# Patient Record
Sex: Female | Born: 1955 | Race: Black or African American | Hispanic: No | Marital: Single | Smoking: Never smoker
Health system: Southern US, Community
[De-identification: ages and names within clinical notes are randomized; demographics above are authoritative.]

## PROBLEM LIST (undated history)

## (undated) DIAGNOSIS — C50919 Malignant neoplasm of unspecified site of unspecified female breast: Secondary | ICD-10-CM

## (undated) DIAGNOSIS — E119 Type 2 diabetes mellitus without complications: Secondary | ICD-10-CM

## (undated) DIAGNOSIS — IMO0001 Reserved for inherently not codable concepts without codable children: Secondary | ICD-10-CM

## (undated) DIAGNOSIS — IMO0002 Reserved for concepts with insufficient information to code with codable children: Secondary | ICD-10-CM

## (undated) DIAGNOSIS — I10 Essential (primary) hypertension: Secondary | ICD-10-CM

## (undated) DIAGNOSIS — E785 Hyperlipidemia, unspecified: Secondary | ICD-10-CM

## (undated) HISTORY — DX: Reserved for concepts with insufficient information to code with codable children: IMO0002

## (undated) HISTORY — DX: Essential (primary) hypertension: I10

## (undated) HISTORY — DX: Reserved for inherently not codable concepts without codable children: IMO0001

## (undated) HISTORY — DX: Hyperlipidemia, unspecified: E78.5

---

## 1998-06-22 ENCOUNTER — Encounter: Payer: Self-pay | Admitting: Internal Medicine

## 1998-06-22 ENCOUNTER — Inpatient Hospital Stay (HOSPITAL_COMMUNITY): Admission: EM | Admit: 1998-06-22 | Discharge: 1998-06-29 | Payer: Self-pay | Admitting: Internal Medicine

## 2001-12-01 ENCOUNTER — Encounter: Payer: Self-pay | Admitting: Emergency Medicine

## 2001-12-01 ENCOUNTER — Emergency Department (HOSPITAL_COMMUNITY): Admission: EM | Admit: 2001-12-01 | Discharge: 2001-12-01 | Payer: Self-pay | Admitting: Emergency Medicine

## 2003-03-26 ENCOUNTER — Emergency Department (HOSPITAL_COMMUNITY): Admission: AD | Admit: 2003-03-26 | Discharge: 2003-03-26 | Payer: Self-pay | Admitting: Emergency Medicine

## 2003-03-29 ENCOUNTER — Encounter: Payer: Self-pay | Admitting: Family Medicine

## 2003-03-30 ENCOUNTER — Encounter: Payer: Self-pay | Admitting: Family Medicine

## 2003-03-30 ENCOUNTER — Inpatient Hospital Stay (HOSPITAL_COMMUNITY): Admission: EM | Admit: 2003-03-30 | Discharge: 2003-04-07 | Payer: Self-pay | Admitting: Emergency Medicine

## 2006-10-08 ENCOUNTER — Emergency Department (HOSPITAL_COMMUNITY): Admission: EM | Admit: 2006-10-08 | Discharge: 2006-10-08 | Payer: Self-pay | Admitting: Emergency Medicine

## 2007-05-26 ENCOUNTER — Emergency Department (HOSPITAL_COMMUNITY): Admission: EM | Admit: 2007-05-26 | Discharge: 2007-05-26 | Payer: Self-pay

## 2009-03-12 ENCOUNTER — Encounter: Admission: RE | Admit: 2009-03-12 | Discharge: 2009-03-12 | Payer: Self-pay | Admitting: *Deleted

## 2009-06-16 HISTORY — PX: CHOLECYSTECTOMY: SHX55

## 2009-06-16 HISTORY — PX: VENTRAL HERNIA REPAIR: SHX424

## 2009-10-31 ENCOUNTER — Encounter: Admission: RE | Admit: 2009-10-31 | Discharge: 2009-10-31 | Payer: Self-pay | Admitting: Family Medicine

## 2010-06-08 ENCOUNTER — Encounter (INDEPENDENT_AMBULATORY_CARE_PROVIDER_SITE_OTHER): Payer: Self-pay

## 2010-06-08 ENCOUNTER — Inpatient Hospital Stay (HOSPITAL_COMMUNITY): Admission: EM | Admit: 2010-06-08 | Discharge: 2010-06-14 | Payer: Self-pay | Source: Home / Self Care

## 2010-06-21 ENCOUNTER — Encounter
Admission: RE | Admit: 2010-06-21 | Discharge: 2010-06-21 | Payer: Self-pay | Source: Home / Self Care | Attending: Surgery | Admitting: Surgery

## 2010-06-24 DIAGNOSIS — R0902 Hypoxemia: Secondary | ICD-10-CM | POA: Insufficient documentation

## 2010-06-24 DIAGNOSIS — K8020 Calculus of gallbladder without cholecystitis without obstruction: Secondary | ICD-10-CM | POA: Insufficient documentation

## 2010-06-24 DIAGNOSIS — E119 Type 2 diabetes mellitus without complications: Secondary | ICD-10-CM | POA: Insufficient documentation

## 2010-06-24 DIAGNOSIS — Z8719 Personal history of other diseases of the digestive system: Secondary | ICD-10-CM | POA: Insufficient documentation

## 2010-06-25 ENCOUNTER — Ambulatory Visit: Admit: 2010-06-25 | Payer: Self-pay | Admitting: Critical Care Medicine

## 2010-06-26 ENCOUNTER — Telehealth: Payer: Self-pay | Admitting: Critical Care Medicine

## 2010-07-16 ENCOUNTER — Ambulatory Visit
Admission: RE | Admit: 2010-07-16 | Discharge: 2010-07-16 | Payer: Self-pay | Source: Home / Self Care | Attending: Critical Care Medicine | Admitting: Critical Care Medicine

## 2010-07-16 DIAGNOSIS — J9819 Other pulmonary collapse: Secondary | ICD-10-CM | POA: Insufficient documentation

## 2010-07-16 DIAGNOSIS — E785 Hyperlipidemia, unspecified: Secondary | ICD-10-CM | POA: Insufficient documentation

## 2010-07-18 NOTE — Progress Notes (Signed)
Summary: nos appt  Phone Note Call from Patient   Caller: [email or web address]  Call For: Carmen Stone Summary of Call: In ref to nos from 1/10, pt states she will call to rsc. Initial call taken by: Netta Neat,  June 26, 2010 3:19 PM

## 2010-07-24 NOTE — Assessment & Plan Note (Addendum)
Summary: Pulmonary Consultation   Copy to:  Dr. Coralie Keens Primary Provider/Referring Provider:  Dr. Louann Liv  CC:  Pulmonary Consult - recent surgery.  Was d/c'd on o2.Marland Kitchen  History of Present Illness: Pulmonary Consultation 55yo AAF referred to eval for any pulm issus related to abdominal surgery.   d/c 12/30 and  had hernia repair and mesh  on ventral abdomen. THis was a complication of  hernia surgery 12 yrs previous The pt had gb removed and ventral hernia repaired  The pt is a never smoker The pt had post op ATX and hypoxemia.  Pt was sent home with hoxygen.   Now:Pt is   is doing well,  there is  no cough, there is  no wheeze.  Pt is not now using home oxygen. Pt denies any issues.  Preventive Screening-Counseling & Management  Alcohol-Tobacco     Smoking Status: never  Current Medications (verified): 1)  Cephalexin 500 Mg Caps (Cephalexin) .... Take 1 Capsule By Mouth Three Times A Day 2)  Hydrocodone-Acetaminophen 5-325 Mg Tabs (Hydrocodone-Acetaminophen) .... As Needed 3)  Lisinopril 5 Mg Tabs (Lisinopril) .... Take 1 Tablet By Mouth Once A Day 4)  Metformin Hcl 500 Mg Xr24h-Tab (Metformin Hcl) .... Take 1 Tablet By Mouth Two Times A Day 5)  Oxygen .... As Needed  Allergies (verified): No Known Drug Allergies  Past History:  Past medical, surgical, family and social histories (including risk factors) reviewed, and no changes noted (except as noted below).  Past Medical History: HYPERLIPIDEMIA (ICD-272.4) DIABETES MELLITUS, TYPE II (ICD-250.00) CHOLELITHIASIS (ICD-574.20) SMALL BOWEL OBSTRUCTION, HX OF (ICD-V12.79)  Past Surgical History: exploratory laparotomy with repair of incarcerated incisional hernia with status mesh lysis of adhesions and repair of colotomy Cholecystectomy 05/2010  Family History: Reviewed history and no changes required. none  Social History: Reviewed history and no changes required. Never Smoked 1 daughter lives with  daughter no alcohol Smoking Status:  never  Review of Systems       The patient complains of loss of appetite.  The patient denies shortness of breath with activity, shortness of breath at rest, productive cough, non-productive cough, coughing up blood, chest pain, irregular heartbeats, acid heartburn, indigestion, weight change, abdominal pain, difficulty swallowing, sore throat, tooth/dental problems, headaches, nasal congestion/difficulty breathing through nose, sneezing, itching, ear ache, anxiety, depression, hand/feet swelling, joint stiffness or pain, rash, change in color of mucus, and fever.    Vital Signs:  Patient profile:   55 year old female Height:      64 inches Weight:      208.25 pounds BMI:     35.88 O2 Sat:      95 % on Room air Temp:     98.4 degrees F oral Pulse rate:   87 / minute BP sitting:   132 / 66  (right arm) Cuff size:   large  Vitals Entered By: Raymondo Band RN (July 16, 2010 1:33 PM)  O2 Flow:  Room air  Serial Vital Signs/Assessments:  Comments: 1:55 PM Ambulatory Pulse Oximetry  Resting; HR__86___    02 Sat__95% on room air___  Lap1 (185 feet)   HR__117___   02 Sat__91% on room air___ Lap2 (185 feet)   HR__120___   02 Sat_90% on room air____    Lap3 (185 feet)   HR__125___   02 Sat__91% on room air___  _x__Test Completed without Difficulty ___Test Stopped due to:  By: Francesca Jewett CMA   CC: Pulmonary Consult - recent surgery.  Was d/c'd  on o2. Comments Medications reviewed with patient Daytime contact number verified with patient. Crystal Jones RN  July 16, 2010 1:28 PM    Physical Exam  Additional Exam:  Gen: Pleasant, obese , in no distress,  normal affect ENT: No lesions,  mouth clear,  oropharynx clear, no postnasal drip Neck: No JVD, no TMG, no carotid bruits Lungs: No use of accessory muscles, no dullness to percussion, clear without rales or rhonchi Cardiovascular: RRR, heart sounds normal, no murmur or gallops, no  peripheral edema Abdomen: soft and NT, no HSM,  BS normal, abd  incision healing Musculoskeletal: No deformities, no cyanosis or clubbing Neuro: alert, non focal Skin: Warm, no lesions or rashes    Impression & Recommendations:  Problem # 1:  ATELECTASIS (ICD-518.0) Assessment Improved 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  Now imporved plan d/c oxygen no further pulm f/u or rx needed  rov as needed  Orders: New Patient Level V [id number])  Medications Added to Medication List This Visit: 1)  Cephalexin 500 Mg Caps (Cephalexin) .... Take 1 capsule by mouth three times a day 2)  Hydrocodone-acetaminophen 5-325 Mg Tabs (Hydrocodone-acetaminophen) .... As needed 3)  Lisinopril 5 Mg Tabs (Lisinopril) .... Take 1 tablet by mouth once a day 4)  Metformin Hcl 500 Mg Xr24h-tab (Metformin hcl) .... Take 1 tablet by mouth two times a day 5)  Oxygen  .... As needed  Complete Medication List: 1)  Cephalexin 500 Mg Caps (Cephalexin) .... Take 1 capsule by mouth three times a day 2)  Hydrocodone-acetaminophen 5-325 Mg Tabs (Hydrocodone-acetaminophen) .... As needed 3)  Lisinopril 5 Mg Tabs (Lisinopril) .... Take 1 tablet by mouth once a day 4)  Metformin Hcl 500 Mg Xr24h-tab (Metformin hcl) .... Take 1 tablet by mouth two times a day  Other Orders: Pulse Oximetry, Ambulatory [id number]) DME Referral (DME)  Patient Instructions: 1)  We will discontinue the oxygen 2)  No other medication changes 3)  Return as needed to pulmonary    Immunization History:  Influenza Immunization History:    Influenza:  historical (05/16/2010)   Appended Document: Pulmonary Consultation fax doug blackman, alvin blount

## 2010-08-26 LAB — GLUCOSE, CAPILLARY
Glucose-Capillary: 141 mg/dL — ABNORMAL HIGH (ref 70–99)
Glucose-Capillary: 142 mg/dL — ABNORMAL HIGH (ref 70–99)
Glucose-Capillary: 145 mg/dL — ABNORMAL HIGH (ref 70–99)
Glucose-Capillary: 156 mg/dL — ABNORMAL HIGH (ref 70–99)
Glucose-Capillary: 157 mg/dL — ABNORMAL HIGH (ref 70–99)
Glucose-Capillary: 159 mg/dL — ABNORMAL HIGH (ref 70–99)
Glucose-Capillary: 160 mg/dL — ABNORMAL HIGH (ref 70–99)
Glucose-Capillary: 167 mg/dL — ABNORMAL HIGH (ref 70–99)
Glucose-Capillary: 169 mg/dL — ABNORMAL HIGH (ref 70–99)
Glucose-Capillary: 181 mg/dL — ABNORMAL HIGH (ref 70–99)
Glucose-Capillary: 183 mg/dL — ABNORMAL HIGH (ref 70–99)
Glucose-Capillary: 184 mg/dL — ABNORMAL HIGH (ref 70–99)
Glucose-Capillary: 184 mg/dL — ABNORMAL HIGH (ref 70–99)
Glucose-Capillary: 186 mg/dL — ABNORMAL HIGH (ref 70–99)
Glucose-Capillary: 219 mg/dL — ABNORMAL HIGH (ref 70–99)
Glucose-Capillary: 238 mg/dL — ABNORMAL HIGH (ref 70–99)
Glucose-Capillary: 252 mg/dL — ABNORMAL HIGH (ref 70–99)
Glucose-Capillary: 269 mg/dL — ABNORMAL HIGH (ref 70–99)

## 2010-08-26 LAB — BASIC METABOLIC PANEL
CO2: 28 mEq/L (ref 19–32)
CO2: 30 mEq/L (ref 19–32)
CO2: 31 mEq/L (ref 19–32)
Calcium: 8 mg/dL — ABNORMAL LOW (ref 8.4–10.5)
Calcium: 9 mg/dL (ref 8.4–10.5)
Chloride: 110 mEq/L (ref 96–112)
GFR calc Af Amer: >60 mL/min (ref >60)
GFR calc Af Amer: >60 mL/min (ref >60)
GFR calc non Af Amer: >60 mL/min (ref >60)
Glucose, Bld: 144 mg/dL — ABNORMAL HIGH (ref 70–99)
Glucose, Bld: 309 mg/dL — ABNORMAL HIGH (ref 70–99)
Potassium: 4 mEq/L (ref 3.5–5.1)
Potassium: 4.1 mEq/L (ref 3.5–5.1)
Sodium: 139 mEq/L (ref 135–145)
Sodium: 144 mEq/L (ref 135–145)
Sodium: 144 mEq/L (ref 135–145)

## 2010-08-26 LAB — CBC
HCT: 32.9 % — ABNORMAL LOW (ref 36.0–46.0)
HCT: 34.2 % — ABNORMAL LOW (ref 36.0–46.0)
HCT: 39.4 % (ref 36.0–46.0)
HCT: 43.6 % (ref 36.0–46.0)
Hemoglobin: 10.4 g/dL — ABNORMAL LOW (ref 12.0–15.0)
Hemoglobin: 10.8 g/dL — ABNORMAL LOW (ref 12.0–15.0)
Hemoglobin: 12.6 g/dL (ref 12.0–15.0)
MCH: 29.3 pg (ref 26.0–34.0)
MCH: 29.4 pg (ref 26.0–34.0)
MCH: 29.6 pg (ref 26.0–34.0)
MCH: 29.8 pg (ref 26.0–34.0)
MCHC: 31.5 g/dL (ref 30.0–36.0)
MCHC: 31.6 g/dL (ref 30.0–36.0)
MCHC: 32 g/dL (ref 30.0–36.0)
MCV: 90.5 fL (ref 78.0–100.0)
MCV: 91.6 fL (ref 78.0–100.0)
MCV: 94.5 fL (ref 78.0–100.0)
Platelets: 300 K/uL (ref 150–400)
Platelets: 366 10*3/uL (ref 150–400)
RBC: 3.51 MIL/uL — ABNORMAL LOW (ref 3.87–5.11)
RBC: 3.62 MIL/uL — ABNORMAL LOW (ref 3.87–5.11)
RBC: 3.67 MIL/uL — ABNORMAL LOW (ref 3.87–5.11)
RBC: 4.3 MIL/uL (ref 3.87–5.11)
RDW: 13.8 % (ref 11.5–15.5)
WBC: 16.9 K/uL — ABNORMAL HIGH (ref 4.0–10.5)

## 2010-08-26 LAB — HEPATIC FUNCTION PANEL
ALT: 20 U/L (ref 0–35)
AST: 18 U/L (ref 0–37)
Albumin: 3.4 g/dL — ABNORMAL LOW (ref 3.5–5.2)
Alkaline Phosphatase: 82 U/L (ref 39–117)
Bilirubin, Direct: <0.1 mg/dL (ref 0.0–0.3)
Total Bilirubin: 0.5 mg/dL (ref 0.3–1.2)
Total Protein: 7.1 g/dL (ref 6.0–8.3)

## 2010-08-26 LAB — BASIC METABOLIC PANEL WITH GFR
BUN: 18 mg/dL (ref 6–23)
CO2: 28 meq/L (ref 19–32)
Calcium: 7.9 mg/dL — ABNORMAL LOW (ref 8.4–10.5)
Chloride: 107 meq/L (ref 96–112)
Creatinine, Ser: 0.89 mg/dL (ref 0.4–1.2)
GFR calc non Af Amer: >60 mL/min
Glucose, Bld: 178 mg/dL — ABNORMAL HIGH (ref 70–99)
Potassium: 4.4 meq/L (ref 3.5–5.1)
Sodium: 139 meq/L (ref 135–145)

## 2010-08-26 LAB — URINE MICROSCOPIC-ADD ON

## 2010-08-26 LAB — URINALYSIS, ROUTINE W REFLEX MICROSCOPIC
Bilirubin Urine: NEGATIVE
Glucose, UA: >1000 mg/dL — AB
Ketones, ur: 15 mg/dL — AB
Leukocytes, UA: NEGATIVE
Nitrite: NEGATIVE
Protein, ur: 30 mg/dL — AB
Specific Gravity, Urine: >1.046 — ABNORMAL HIGH (ref 1.005–1.030)
Urobilinogen, UA: 0.2 mg/dL (ref 0.0–1.0)
pH: 5.5 (ref 5.0–8.0)

## 2010-08-26 LAB — LIPASE, BLOOD: Lipase: 16 U/L (ref 11–59)

## 2010-08-26 LAB — DIFFERENTIAL
Basophils Relative: 0 % (ref 0–1)
Lymphocytes Relative: 12 % (ref 12–46)
Lymphs Abs: 1.8 10*3/uL (ref 0.7–4.0)
Monocytes Absolute: 1 10*3/uL (ref 0.1–1.0)

## 2010-08-26 LAB — MRSA PCR SCREENING: MRSA by PCR: NEGATIVE

## 2010-11-01 NOTE — H&P (Signed)
Carmen Stone, Carmen Stone                     ACCOUNT NO.:  [id number]   MEDICAL RECORD NO.:  [id number]                   PATIENT TYPE:  INP   LOCATION:  [place]                                 FACILITY:  [facility]   PHYSICIAN:  Merri Ray. Grandville Silos, M.D.             DATE OF BIRTH:  Aug 16, 1955   DATE OF ADMISSION:  03/30/2003  DATE OF DISCHARGE:                                HISTORY & PHYSICAL   CHIEF COMPLAINT:  Nausea and vomiting.   HISTORY OF PRESENT ILLNESS:  The patient is a 55 year old otherwise healthy  African American female with a one-week history of nausea and vomiting.  She  had an incisional hernia repair in January of 2000 by Dr. Marland Kitchen T.  Hoxworth from our practice, but previously, she has also had a hysterectomy.  She was evaluated in the emergency department.  CT scan was done which  revealed small-bowel obstruction with recurrence of her hernia with possible  large and small bowel within it.  She has been passing a little bit of  flatus but has not had any significant bowel movements for about six days.  She denies any significant pain and has no other complaints right now.   PAST MEDICAL HISTORY:  Past medical history is negative.   PAST SURGICAL HISTORY:  1. Hysterectomy  2. Incisional hernia repair.   SOCIAL HISTORY:  She does not smoke and does not drink alcohol.   MEDICATIONS:  None.   ALLERGIES:  No known drug allergies.   REVIEW OF SYSTEMS:  GENERAL:  Review of systems, in general, negative.  CARDIOVASCULAR:  She, on admission, had some lower chest pain, but this was  more underneath her ribs at the top of her abdomen.  PULMONARY:  No  shortness of breath.  GI:  Please refer to the history of present illness.   PHYSICAL EXAMINATION:  VITAL SIGNS:  On physical exam, temperature is 99.5,  pulse 81, blood pressure 147/68, respirations 16, saturations 93-96%.  GENERAL:  She is awake, alert and in no acute distress.  HEENT:  Pupils are equal and reactive.   Her sclerae are nonicteric.  NECK:  Her neck is supple.  There are no thyroid masses.  LUNGS:  Her lungs are clear to auscultation bilaterally.  HEART:  Heart is regular rate and rhythm.  PMI is palpable in her left  chest.  ABDOMEN:  Her abdomen is soft, not significantly tender.  She has an obvious  lower midline hernia with some noted thinning of the overlying skin.  The  bowel is palpable underneath.  She does have about a 2-cm scab over one  portion of the skin that has become quite thin.  She has some moderately  active normal bowel sounds and no appreciable tenderness.  RECTAL:  No gross blood and some brown stool.  SKIN:  Skin is warm and dry with no rashes.   DATA:  Data reviewed includes white blood cell count  12.1, hemoglobin 13.2,  platelets 469,000; sodium 138, potassium 3.8, chloride 102, carbon dioxide  28, BUN 14, creatinine 1, glucose 117.  Her AST is 86, ALT 117, alkaline  phosphatase 84, bilirubin 0.5.  Urinalysis is negative.   CT scan of the abdomen and pelvis shows a recurrent hernia in the lower  midline with small-bowel obstruction as previously described.   IMPRESSION AND PLAN:  Recurrent incisional hernia with small-bowel  obstruction.  Plan will be to admit her to the hospital, give her  intravenous fluids, we will give her a bolus right now as well, place an  nasogastric tube and the patient will need an exploratory laparotomy with  release of her obstruction and repair of her hernia.  I will let Dr.  Excell Seltzer know our plan, but I believe he is going on vacation.                                                Merri Ray Grandville Silos, M.D.    BET/MEDQ  D:  03/30/2003  T:  03/30/2003  Job:  [id number]

## 2010-11-01 NOTE — Op Note (Signed)
Carmen Stone, Carmen Stone                     ACCOUNT NO.:  [id number]   MEDICAL RECORD NO.:  [id number]                   PATIENT TYPE:  INP   LOCATION:  6703                                 FACILITY:  [facility]   PHYSICIAN:  Merri Ray. Grandville Silos, M.D.             DATE OF BIRTH:  [birth year]   DATE OF PROCEDURE:  03/30/2003  DATE OF DISCHARGE:                                 OPERATIVE REPORT   PREOPERATIVE DIAGNOSIS:  Recurrent ventral incisional hernia with small  bowel obstruction.   POSTOPERATIVE DIAGNOSIS:  Recurrent ventral incisional hernia with small  bowel obstruction.   PROCEDURE:  1. Exploratory laparotomy.  2. Release of small bowel obstruction.  3. Repair of recurrent ventral incisional hernia.   SURGEON:  Merri Ray. Grandville Silos, M.D.   ASSISTANT:  Judeth Horn, M.D.   ANESTHESIA:  General.   INDICATIONS FOR PROCEDURE:  The patient is a 55 year old African-American  female who had a ventral incisional hernia repaired by Marland Kitchen T. Hoxworth,  M.D. from our practice in January of 2000.  She presented to the emergency  department with seven days of obstipation and increasing nausea with some  vomiting.  She had some subcostal upper abdominal pain as well.  Workup  included CT scan of the abdomen and pelvis which revealed a complex  recurrent ventral incisional hernia.  She was hydrated and brought to the  operating room.   DESCRIPTION OF PROCEDURE:  Informed consent was obtained.  The patient  received intravenous antibiotics.  She was brought to the operating room and  general anesthesia was administered.  Her abdomen was prepped and draped in  the usual sterile fashion.  A midline incision was made from above her  umbilicus and continued down following along her old scar which was deviated  to the right due to this large protuberant hernia.  Subcutaneous tissues  were carefully dissected down.  The anterior fascia was divided and the  peritoneal cavity was entered under  direct vision superior to her previous  incision.  The abdomen was explored.  There was a good deal of adhesions up  to the old midline incision.  These were gently taken down. An umbilical  hernia was present with some omental fat in it. This was reduced.  Dissection continued with great care not to injure the bowel and we entered  into the first hernia sac which was quite large and had over two feet of  small bowel coiled into it and through the anterior abdominal wall.  This  sac area was opened and bowel was reduced out.  The bowel was viable.  Further careful dissection allowed [place] to enter the more inferior large  incarcerated sac.  Several adhesions were lysed along the way.  This sac was  carefully entered without injuring the bowel. Its contents was also reduced  and adhesions were taken down from the sac without damaging the bowel.  Once  all of the bowel  was reduced, the hemostasis was obtained in these hernia  sacs.  The small bowel was then run. Several other areas of adhesions were  taken down without injury to the bowel and we milked back a great deal of  air and enteric contents to the NG tube. The NG tube had been replaced by  anesthesia and was felt to be in good position.  Revealed tube activated  _________ of the small bowel content.  Once this was accomplished, we  addressed the fascia circumferentially and this was cleared from the  subcutaneous fat. A large section of the more superior two hernia sacs were  resected to facilitate good fascial exposure to allow [place] to do our closure.  Once this was accomplished successfully around the wound, the abdomen was  copiously irrigated.  The small bowel was returned to an anatomical  position.  The omentum was draped across the small bowel and the fascia was  closed with a series of interrupted figure-of-eight #1 Prolene sutures.  Once we closed the top several cm, these were placed and then the sutures  were put on hemostat to  allow placing all of the sutures first before tying  them.  We subsequently tied them with care not to trap any of the intra-  abdominal contents.  The nice closure was obtained.  Subcutaneous tissues  were copiously irrigated.  A 19 [place] Blake drain was placed in the pocket  and it had been developed by the hernia and the subcutaneous tissue.  Hemostasis was obtained and the skin was closed with staples.  The drain had  been sewn in with 3-0 nylon suture.  The patient tolerated the procedure  well without complications.  Needle, sponge, and instrument count correct.  She was taken to the recovery room in stable condition.                                               Merri Ray Grandville Silos, M.D.    BET/MEDQ  D:  03/30/2003  T:  03/31/2003  Job:  [id number]

## 2010-11-01 NOTE — Discharge Summary (Signed)
   Carmen Stone, WORMSER                     ACCOUNT NO.:  [id number]   MEDICAL RECORD NO.:  [id number]                   PATIENT TYPE:  INP   LOCATION:  5743                                 FACILITY:  [facility]   PHYSICIAN:  Merri Ray. Grandville Silos, M.D.             DATE OF BIRTH:  Sep 23, 1955   DATE OF ADMISSION:  03/29/2003  DATE OF DISCHARGE:  04/07/2003                                 DISCHARGE SUMMARY   DISCHARGE DIAGNOSES:  Status post repair recurrent ventral incisional hernia  and recent small-bowel obstruction.   HISTORY OF PRESENT ILLNESS:  The patient is a 55 year old African-American  female  with a history of ventral incisional hernia repair by Dr. Excell Seltzer  in January 2000. She presented to the emergency department with seven days  of obstipation, increasing nausea and vomiting and was diagnosed with  incarcerated recurrent ventral incisional hernia and bowel obstruction. She  was taken urgently to the operating room.   HOSPITAL COURSE:  The patient underwent exploratory laparotomy with release  of bowel obstruction and repair of recurrent ventral incisional hernia.  Postoperatively, NG tube was continued until her postoperative ileus  resolved. She remained afebrile and hemodynamically stable, and a JP that  was placed in the hernia cavity in her subcu tissue was removed. She  remained hemodynamically stable and afebrile. After resolution of her ileus,  her NG was removed, and her diet was advanced. She tolerated this well and  had good control of her pain and was discharged home on April 07, 2003 in  stable condition.   DISCHARGE DIET:  As tolerated.   DISCHARGE ACTIVITIES:  No lifting.   DISCHARGE MEDICATIONS:  Percocet 5/325 one to two p.o. q.6h. p.r.n. pain.   FOLLOW UP:  With Dr. Georganna Skeans in three weeks.                                                Merri Ray Grandville Silos, M.D.    BET/MEDQ  D:  04/19/2003  T:  04/20/2003  Job:  [id number]

## 2013-10-22 ENCOUNTER — Emergency Department (HOSPITAL_COMMUNITY): Payer: BC Managed Care – PPO | Admitting: Certified Registered"

## 2013-10-22 ENCOUNTER — Encounter (HOSPITAL_COMMUNITY): Payer: BC Managed Care – PPO | Admitting: Certified Registered"

## 2013-10-22 ENCOUNTER — Inpatient Hospital Stay (HOSPITAL_COMMUNITY)
Admission: EM | Admit: 2013-10-22 | Discharge: 2013-11-07 | DRG: 579 | Disposition: A | Discharge status: Home Health | Payer: BC Managed Care – PPO | Attending: Internal Medicine | Admitting: Internal Medicine

## 2013-10-22 ENCOUNTER — Encounter (HOSPITAL_COMMUNITY): Admission: EM | Disposition: A | Discharge status: Home Health | Payer: Self-pay | Source: Home / Self Care

## 2013-10-22 ENCOUNTER — Emergency Department (HOSPITAL_COMMUNITY): Payer: BC Managed Care – PPO

## 2013-10-22 ENCOUNTER — Encounter (HOSPITAL_COMMUNITY): Payer: Self-pay | Admitting: Emergency Medicine

## 2013-10-22 DIAGNOSIS — L0390 Cellulitis, unspecified: Secondary | ICD-10-CM

## 2013-10-22 DIAGNOSIS — N61 Mastitis without abscess: Secondary | ICD-10-CM

## 2013-10-22 DIAGNOSIS — E785 Hyperlipidemia, unspecified: Secondary | ICD-10-CM | POA: Diagnosis present

## 2013-10-22 DIAGNOSIS — R112 Nausea with vomiting, unspecified: Secondary | ICD-10-CM

## 2013-10-22 DIAGNOSIS — J9600 Acute respiratory failure, unspecified whether with hypoxia or hypercapnia: Secondary | ICD-10-CM | POA: Diagnosis present

## 2013-10-22 DIAGNOSIS — R6510 Systemic inflammatory response syndrome (SIRS) of non-infectious origin without acute organ dysfunction: Secondary | ICD-10-CM

## 2013-10-22 DIAGNOSIS — A419 Sepsis, unspecified organism: Secondary | ICD-10-CM | POA: Diagnosis present

## 2013-10-22 DIAGNOSIS — N641 Fat necrosis of breast: Secondary | ICD-10-CM

## 2013-10-22 DIAGNOSIS — Z794 Long term (current) use of insulin: Secondary | ICD-10-CM

## 2013-10-22 DIAGNOSIS — N390 Urinary tract infection, site not specified: Secondary | ICD-10-CM | POA: Diagnosis present

## 2013-10-22 DIAGNOSIS — C50919 Malignant neoplasm of unspecified site of unspecified female breast: Secondary | ICD-10-CM | POA: Diagnosis present

## 2013-10-22 DIAGNOSIS — R1115 Cyclical vomiting syndrome unrelated to migraine: Secondary | ICD-10-CM | POA: Diagnosis not present

## 2013-10-22 DIAGNOSIS — E1165 Type 2 diabetes mellitus with hyperglycemia: Secondary | ICD-10-CM

## 2013-10-22 DIAGNOSIS — N611 Abscess of the breast and nipple: Secondary | ICD-10-CM | POA: Diagnosis present

## 2013-10-22 DIAGNOSIS — D62 Acute posthemorrhagic anemia: Secondary | ICD-10-CM | POA: Diagnosis present

## 2013-10-22 DIAGNOSIS — C773 Secondary and unspecified malignant neoplasm of axilla and upper limb lymph nodes: Secondary | ICD-10-CM | POA: Diagnosis present

## 2013-10-22 DIAGNOSIS — E119 Type 2 diabetes mellitus without complications: Secondary | ICD-10-CM | POA: Diagnosis present

## 2013-10-22 DIAGNOSIS — Z9089 Acquired absence of other organs: Secondary | ICD-10-CM

## 2013-10-22 DIAGNOSIS — R0902 Hypoxemia: Secondary | ICD-10-CM

## 2013-10-22 DIAGNOSIS — R8290 Unspecified abnormal findings in urine: Secondary | ICD-10-CM | POA: Diagnosis present

## 2013-10-22 DIAGNOSIS — C50319 Malignant neoplasm of lower-inner quadrant of unspecified female breast: Principal | ICD-10-CM | POA: Diagnosis present

## 2013-10-22 DIAGNOSIS — M726 Necrotizing fasciitis: Secondary | ICD-10-CM | POA: Diagnosis present

## 2013-10-22 DIAGNOSIS — N170 Acute kidney failure with tubular necrosis: Secondary | ICD-10-CM | POA: Diagnosis not present

## 2013-10-22 DIAGNOSIS — J9601 Acute respiratory failure with hypoxia: Secondary | ICD-10-CM | POA: Diagnosis not present

## 2013-10-22 DIAGNOSIS — R6520 Severe sepsis without septic shock: Secondary | ICD-10-CM

## 2013-10-22 DIAGNOSIS — Z418 Encounter for other procedures for purposes other than remedying health state: Secondary | ICD-10-CM

## 2013-10-22 DIAGNOSIS — K432 Incisional hernia without obstruction or gangrene: Secondary | ICD-10-CM | POA: Diagnosis present

## 2013-10-22 DIAGNOSIS — E876 Hypokalemia: Secondary | ICD-10-CM | POA: Diagnosis not present

## 2013-10-22 DIAGNOSIS — Z2989 Encounter for other specified prophylactic measures: Secondary | ICD-10-CM

## 2013-10-22 DIAGNOSIS — K8020 Calculus of gallbladder without cholecystitis without obstruction: Secondary | ICD-10-CM

## 2013-10-22 DIAGNOSIS — A4902 Methicillin resistant Staphylococcus aureus infection, unspecified site: Secondary | ICD-10-CM | POA: Diagnosis present

## 2013-10-22 DIAGNOSIS — Z8719 Personal history of other diseases of the digestive system: Secondary | ICD-10-CM

## 2013-10-22 DIAGNOSIS — J9819 Other pulmonary collapse: Secondary | ICD-10-CM

## 2013-10-22 DIAGNOSIS — IMO0001 Reserved for inherently not codable concepts without codable children: Secondary | ICD-10-CM | POA: Diagnosis present

## 2013-10-22 DIAGNOSIS — Z79899 Other long term (current) drug therapy: Secondary | ICD-10-CM

## 2013-10-22 DIAGNOSIS — Z8711 Personal history of peptic ulcer disease: Secondary | ICD-10-CM

## 2013-10-22 HISTORY — DX: Malignant neoplasm of unspecified site of unspecified female breast: C50.919

## 2013-10-22 HISTORY — PX: BREAST BIOPSY: SHX20

## 2013-10-22 HISTORY — DX: Type 2 diabetes mellitus without complications: E11.9

## 2013-10-22 HISTORY — PX: INCISION AND DRAINAGE OF WOUND: SHX1803

## 2013-10-22 LAB — COMPREHENSIVE METABOLIC PANEL
ALT: 10 U/L (ref 0–35)
AST: 12 U/L (ref 0–37)
Albumin: 2.4 g/dL — ABNORMAL LOW (ref 3.5–5.2)
Alkaline Phosphatase: 82 U/L (ref 39–117)
BUN: 9 mg/dL (ref 6–23)
CHLORIDE: 95 meq/L — AB (ref 96–112)
CO2: 23 mEq/L (ref 19–32)
CREATININE: 0.48 mg/dL — AB (ref 0.50–1.10)
Calcium: 8.8 mg/dL (ref 8.4–10.5)
GFR calc Af Amer: >90 mL/min (ref >90)
GFR calc non Af Amer: >90 mL/min (ref >90)
Glucose, Bld: 306 mg/dL — ABNORMAL HIGH (ref 70–99)
Potassium: 4.1 mEq/L (ref 3.7–5.3)
Sodium: 133 mEq/L — ABNORMAL LOW (ref 137–147)
Total Bilirubin: 0.3 mg/dL (ref 0.3–1.2)
Total Protein: 7.1 g/dL (ref 6.0–8.3)

## 2013-10-22 LAB — URINALYSIS, ROUTINE W REFLEX MICROSCOPIC
Ketones, ur: 15 mg/dL — AB
Nitrite: NEGATIVE
Protein, ur: 100 mg/dL — AB
SPECIFIC GRAVITY, URINE: 1.043 — AB (ref 1.005–1.030)
UROBILINOGEN UA: 2 mg/dL — AB (ref 0.0–1.0)
pH: 5.5 (ref 5.0–8.0)

## 2013-10-22 LAB — CBC WITH DIFFERENTIAL/PLATELET
BASOS ABS: 0 10*3/uL (ref 0.0–0.1)
Basophils Relative: 0 % (ref 0–1)
EOS PCT: 0 % (ref 0–5)
Eosinophils Absolute: 0 10*3/uL (ref 0.0–0.7)
HCT: 29.8 % — ABNORMAL LOW (ref 36.0–46.0)
Hemoglobin: 9.6 g/dL — ABNORMAL LOW (ref 12.0–15.0)
Lymphocytes Relative: 13 % (ref 12–46)
Lymphs Abs: 2.1 10*3/uL (ref 0.7–4.0)
MCH: 27 pg (ref 26.0–34.0)
MCHC: 32.2 g/dL (ref 30.0–36.0)
MCV: 83.9 fL (ref 78.0–100.0)
Monocytes Absolute: 1.8 10*3/uL — ABNORMAL HIGH (ref 0.1–1.0)
Monocytes Relative: 11 % (ref 3–12)
NEUTROS ABS: 12 10*3/uL — AB (ref 1.7–7.7)
Neutrophils Relative %: 76 % (ref 43–77)
Platelets: 491 10*3/uL — ABNORMAL HIGH (ref 150–400)
RBC: 3.55 MIL/uL — ABNORMAL LOW (ref 3.87–5.11)
RDW: 13.4 % (ref 11.5–15.5)
WBC: 15.9 10*3/uL — ABNORMAL HIGH (ref 4.0–10.5)

## 2013-10-22 LAB — GLUCOSE, CAPILLARY
GLUCOSE-CAPILLARY: 201 mg/dL — AB (ref 70–99)
Glucose-Capillary: 258 mg/dL — ABNORMAL HIGH (ref 70–99)
Glucose-Capillary: 257 mg/dL — ABNORMAL HIGH (ref 70–99)
Glucose-Capillary: 251 mg/dL — ABNORMAL HIGH (ref 70–99)

## 2013-10-22 LAB — URINE MICROSCOPIC-ADD ON

## 2013-10-22 LAB — CBG MONITORING, ED: GLUCOSE-CAPILLARY: 259 mg/dL — AB (ref 70–99)

## 2013-10-22 LAB — I-STAT CG4 LACTIC ACID, ED: Lactic Acid, Venous: 1.31 mmol/L (ref 0.5–2.2)

## 2013-10-22 SURGERY — IRRIGATION AND DEBRIDEMENT WOUND
Anesthesia: General | Laterality: Right

## 2013-10-22 MED ORDER — SODIUM CHLORIDE 0.9 % IJ SOLN
INTRAMUSCULAR | Status: AC
  Filled 2013-10-22: qty 10

## 2013-10-22 MED ORDER — OXYCODONE HCL 5 MG/5ML PO SOLN: 5.0000 mg | Freq: Once | ORAL | Status: DC | PRN

## 2013-10-22 MED ORDER — BUPIVACAINE-EPINEPHRINE (PF) 0.25% -1:200000 IJ SOLN
INTRAMUSCULAR | Status: AC
  Filled 2013-10-22: qty 30

## 2013-10-22 MED ORDER — SODIUM CHLORIDE 0.9 % IV BOLUS (SEPSIS): 1000.0000 mL | Freq: Once | INTRAVENOUS | Status: DC

## 2013-10-22 MED ORDER — HYDROMORPHONE HCL PF 1 MG/ML IJ SOLN: 0.2500 mg | INTRAMUSCULAR | Status: DC | PRN

## 2013-10-22 MED ORDER — PROPOFOL 10 MG/ML IV BOLUS
INTRAVENOUS | Status: DC | PRN
  Administered 2013-10-22: 200 mg via INTRAVENOUS

## 2013-10-22 MED ORDER — INSULIN ASPART 100 UNIT/ML ~~LOC~~ SOLN
0.0000 [IU] | Freq: Three times a day (TID) | SUBCUTANEOUS | Status: DC
  Administered 2013-10-22 – 2013-10-23 (×3): 8 [IU] via SUBCUTANEOUS
  Administered 2013-10-23: 5 [IU] via SUBCUTANEOUS

## 2013-10-22 MED ORDER — SUFENTANIL CITRATE 50 MCG/ML IV SOLN
INTRAVENOUS | Status: AC
  Filled 2013-10-22: qty 1

## 2013-10-22 MED ORDER — VANCOMYCIN HCL IN DEXTROSE 1-5 GM/200ML-% IV SOLN: 1000.0000 mg | Freq: Once | INTRAVENOUS | Status: DC

## 2013-10-22 MED ORDER — SODIUM CHLORIDE 0.9 % IV SOLN: Freq: Once | INTRAVENOUS | Status: DC

## 2013-10-22 MED ORDER — SUCCINYLCHOLINE CHLORIDE 20 MG/ML IJ SOLN
INTRAMUSCULAR | Status: AC
  Filled 2013-10-22: qty 1

## 2013-10-22 MED ORDER — MORPHINE SULFATE 2 MG/ML IJ SOLN: 2.0000 mg | INTRAMUSCULAR | Status: DC | PRN

## 2013-10-22 MED ORDER — VANCOMYCIN HCL IN DEXTROSE 1-5 GM/200ML-% IV SOLN
1000.0000 mg | Freq: Two times a day (BID) | INTRAVENOUS | Status: DC
  Administered 2013-10-22 – 2013-10-29 (×16): 1000 mg via INTRAVENOUS
  Filled 2013-10-22 (×19): qty 200

## 2013-10-22 MED ORDER — CLINDAMYCIN PHOSPHATE 900 MG/50ML IV SOLN
900.0000 mg | Freq: Once | INTRAVENOUS | Status: AC
  Administered 2013-10-22: 900 mg via INTRAVENOUS
  Filled 2013-10-22: qty 50

## 2013-10-22 MED ORDER — OXYCODONE HCL 5 MG PO TABS: 5.0000 mg | ORAL_TABLET | Freq: Once | ORAL | Status: DC | PRN

## 2013-10-22 MED ORDER — SODIUM CHLORIDE 0.9 % IV SOLN
Freq: Once | INTRAVENOUS | Status: AC
  Administered 2013-10-22: 12:00:00 via INTRAVENOUS

## 2013-10-22 MED ORDER — ONDANSETRON HCL 4 MG/2ML IJ SOLN: 4.0000 mg | Freq: Four times a day (QID) | INTRAMUSCULAR | Status: DC | PRN

## 2013-10-22 MED ORDER — METRONIDAZOLE IN NACL 5-0.79 MG/ML-% IV SOLN: 500.0000 mg | Freq: Once | INTRAVENOUS | Status: DC

## 2013-10-22 MED ORDER — OXYCODONE-ACETAMINOPHEN 5-325 MG PO TABS
1.0000 | ORAL_TABLET | ORAL | Status: DC | PRN
  Administered 2013-10-22 – 2013-10-23 (×3): 2 via ORAL
  Filled 2013-10-22 (×3): qty 2

## 2013-10-22 MED ORDER — MIDAZOLAM HCL 2 MG/2ML IJ SOLN
INTRAMUSCULAR | Status: AC
  Filled 2013-10-22: qty 2

## 2013-10-22 MED ORDER — LIDOCAINE HCL (CARDIAC) 20 MG/ML IV SOLN
INTRAVENOUS | Status: AC
  Filled 2013-10-22: qty 5

## 2013-10-22 MED ORDER — MIDAZOLAM HCL 5 MG/5ML IJ SOLN
INTRAMUSCULAR | Status: DC | PRN
  Administered 2013-10-22: 2 mg via INTRAVENOUS

## 2013-10-22 MED ORDER — SODIUM CHLORIDE 0.9 % IV SOLN
INTRAVENOUS | Status: DC | PRN
  Administered 2013-10-22 (×2): via INTRAVENOUS

## 2013-10-22 MED ORDER — PROPOFOL 10 MG/ML IV BOLUS
INTRAVENOUS | Status: AC
  Filled 2013-10-22: qty 20

## 2013-10-22 MED ORDER — CLINDAMYCIN PHOSPHATE 600 MG/50ML IV SOLN
600.0000 mg | Freq: Four times a day (QID) | INTRAVENOUS | Status: DC
  Administered 2013-10-22 – 2013-10-31 (×35): 600 mg via INTRAVENOUS
  Filled 2013-10-22 (×43): qty 50

## 2013-10-22 MED ORDER — DEXTROSE 5 % IV SOLN
1.0000 g | Freq: Once | INTRAVENOUS | Status: AC
  Administered 2013-10-22: 1 g via INTRAVENOUS
  Filled 2013-10-22: qty 1

## 2013-10-22 MED ORDER — IOHEXOL 300 MG/ML  SOLN: 80.0000 mL | Freq: Once | INTRAMUSCULAR | Status: AC | PRN

## 2013-10-22 MED ORDER — SUFENTANIL CITRATE 50 MCG/ML IV SOLN
INTRAVENOUS | Status: DC | PRN
  Administered 2013-10-22: 20 ug via INTRAVENOUS

## 2013-10-22 MED ORDER — LIDOCAINE HCL (CARDIAC) 20 MG/ML IV SOLN
INTRAVENOUS | Status: DC | PRN
  Administered 2013-10-22: 80 mg via INTRAVENOUS

## 2013-10-22 MED ORDER — SODIUM CHLORIDE 0.9 % IV BOLUS (SEPSIS)
1000.0000 mL | Freq: Once | INTRAVENOUS | Status: AC
  Administered 2013-10-22: 1000 mL via INTRAVENOUS

## 2013-10-22 MED ORDER — BUPIVACAINE-EPINEPHRINE 0.25% -1:200000 IJ SOLN
INTRAMUSCULAR | Status: DC | PRN
Start: 2013-10-22 — End: 2013-10-22

## 2013-10-22 SURGICAL SUPPLY — 59 items
ADH SKN CLS APL DERMABOND .7 (GAUZE/BANDAGES/DRESSINGS) ×1
APPLIER CLIP 9.375 SM OPEN (CLIP) ×3
APR CLP SM 9.3 20 MLT OPN (CLIP) ×1
BINDER BREAST LRG (GAUZE/BANDAGES/DRESSINGS) IMPLANT
BINDER BREAST XLRG (GAUZE/BANDAGES/DRESSINGS) ×2 IMPLANT
BNDG GAUZE ELAST 4 BULKY (GAUZE/BANDAGES/DRESSINGS) ×2 IMPLANT
CANISTER SUCTION 2500CC (MISCELLANEOUS) IMPLANT
CHLORAPREP W/TINT 26ML (MISCELLANEOUS) ×3 IMPLANT
CLIP APPLIE 9.375 SM OPEN (CLIP) IMPLANT
CONT SPEC 4OZ CLIKSEAL STRL BL (MISCELLANEOUS) ×2 IMPLANT
COVER SURGICAL LIGHT HANDLE (MISCELLANEOUS) ×3 IMPLANT
DECANTER SPIKE VIAL GLASS SM (MISCELLANEOUS) ×1 IMPLANT
DERMABOND ADVANCED (GAUZE/BANDAGES/DRESSINGS) ×2
DERMABOND ADVANCED .7 DNX12 (GAUZE/BANDAGES/DRESSINGS) ×1 IMPLANT
DRAPE LAPAROSCOPIC ABDOMINAL (DRAPES) ×3 IMPLANT
DRAPE UTILITY 15X26 W/TAPE STR (DRAPE) ×8 IMPLANT
DRSG PAD ABDOMINAL 8X10 ST (GAUZE/BANDAGES/DRESSINGS) ×2 IMPLANT
ELECT CAUTERY BLADE 6.4 (BLADE) ×3 IMPLANT
ELECT REM PT RETURN 9FT ADLT (ELECTROSURGICAL) ×3
ELECTRODE REM PT RTRN 9FT ADLT (ELECTROSURGICAL) ×1 IMPLANT
GLOVE BIOGEL M STRL SZ7.5 (GLOVE) ×3 IMPLANT
GLOVE BIOGEL PI IND STRL 7.5 (GLOVE) IMPLANT
GLOVE BIOGEL PI IND STRL 8 (GLOVE) ×2 IMPLANT
GLOVE BIOGEL PI INDICATOR 7.5 (GLOVE) ×2
GLOVE BIOGEL PI INDICATOR 8 (GLOVE) ×4
GLOVE SURG SS PI 7.5 STRL IVOR (GLOVE) ×2 IMPLANT
GOWN STRL REUS W/ TWL LRG LVL3 (GOWN DISPOSABLE) ×2 IMPLANT
GOWN STRL REUS W/ TWL XL LVL3 (GOWN DISPOSABLE) ×1 IMPLANT
GOWN STRL REUS W/TWL 2XL LVL3 (GOWN DISPOSABLE) ×2 IMPLANT
GOWN STRL REUS W/TWL LRG LVL3 (GOWN DISPOSABLE)
GOWN STRL REUS W/TWL XL LVL3 (GOWN DISPOSABLE) ×3
KIT BASIN OR (CUSTOM PROCEDURE TRAY) ×3 IMPLANT
KIT ROOM TURNOVER OR (KITS) ×3 IMPLANT
MARKER SKIN DUAL TIP RULER LAB (MISCELLANEOUS) ×2 IMPLANT
NDL HYPO 25GX1X1/2 BEV (NEEDLE) ×1 IMPLANT
NEEDLE HYPO 25GX1X1/2 BEV (NEEDLE) ×3 IMPLANT
NS IRRIG 1000ML POUR BTL (IV SOLUTION) ×3 IMPLANT
PACK GENERAL/GYN (CUSTOM PROCEDURE TRAY) ×2 IMPLANT
PACK SURGICAL SETUP 50X90 (CUSTOM PROCEDURE TRAY) ×1 IMPLANT
PAD ARMBOARD 7.5X6 YLW CONV (MISCELLANEOUS) ×6 IMPLANT
PENCIL BUTTON HOLSTER BLD 10FT (ELECTRODE) ×3 IMPLANT
SPECIMEN JAR MEDIUM (MISCELLANEOUS) ×1 IMPLANT
SPONGE GAUZE 4X4 12PLY STER LF (GAUZE/BANDAGES/DRESSINGS) ×2 IMPLANT
SPONGE LAP 4X18 X RAY DECT (DISPOSABLE) ×3 IMPLANT
STAPLER VISISTAT 35W (STAPLE) ×2 IMPLANT
SUCTION POOLE TIP (SUCTIONS) ×2 IMPLANT
SUT MON AB 4-0 PC3 18 (SUTURE) ×1 IMPLANT
SUT SILK 2 0 SH (SUTURE) IMPLANT
SUT VIC AB 3-0 SH 18 (SUTURE) ×1 IMPLANT
SWAB COLLECTION DEVICE MRSA (MISCELLANEOUS) ×2 IMPLANT
SYR BULB 3OZ (MISCELLANEOUS) ×1 IMPLANT
SYR CONTROL 10ML LL (SYRINGE) ×3 IMPLANT
TOWEL OR 17X24 6PK STRL BLUE (TOWEL DISPOSABLE) ×3 IMPLANT
TOWEL OR 17X26 10 PK STRL BLUE (TOWEL DISPOSABLE) ×3 IMPLANT
TUBE ANAEROBIC SPECIMEN COL (MISCELLANEOUS) ×2 IMPLANT
TUBE CONNECTING 12'X1/4 (SUCTIONS)
TUBE CONNECTING 12X1/4 (SUCTIONS) IMPLANT
WATER STERILE IRR 1000ML POUR (IV SOLUTION) IMPLANT
YANKAUER SUCT BULB TIP NO VENT (SUCTIONS) IMPLANT

## 2013-10-22 NOTE — Progress Notes (Signed)
Patient ID: Carmen Stone, female   DOB: [birth year], 58 y.o.   MRN: [id number] [facility] Surgery Progress Note:   Day of Surgery  Subjective: Mental status is clear.  Pain from abscess much better.   Objective: Vital signs in last 24 hours: Temp:  [98.5 F (36.9 C)-100.1 F (37.8 C)] 98.6 F (37 C) (05/09 0928) Pulse Rate:  [83-122] 92 (05/09 0928) Resp:  [15-24] 16 (05/09 0928) BP: (96-155)/(51-66) 110/51 mmHg (05/09 0928) SpO2:  [95 %-100 %] 95 % (05/09 0928) Weight:  [164 lb 5 oz (74.532 kg)] 164 lb 5 oz (74.532 kg) (05/09 0152)  Intake/Output from previous day:   Intake/Output this shift: Total I/O In: 740 [P.O.:240; I.V.:500] Out: 575 [Urine:500; Blood:75]  Physical Exam: Work of breathing is normal.  Packing in place.    Lab Results:  Results for orders placed during the hospital encounter of 10/22/13 (from the past 48 hour(s))  URINALYSIS, ROUTINE W REFLEX MICROSCOPIC     Status: Abnormal   Collection Time    10/22/13  1:59 AM      Result Value Ref Range   Color, Urine ORANGE (*) YELLOW   Comment: BIOCHEMICALS MAY BE AFFECTED BY COLOR   APPearance CLOUDY (*) CLEAR   Specific Gravity, Urine 1.043 (*) 1.005 - 1.030   pH 5.5  5.0 - 8.0   Glucose, UA >1000 (*) NEGATIVE mg/dL   Hgb urine dipstick MODERATE (*) NEGATIVE   Bilirubin Urine MODERATE (*) NEGATIVE   Ketones, ur 15 (*) NEGATIVE mg/dL   Protein, ur 100 (*) NEGATIVE mg/dL   Urobilinogen, UA 2.0 (*) 0.0 - 1.0 mg/dL   Nitrite NEGATIVE  NEGATIVE   Leukocytes, UA SMALL (*) NEGATIVE  URINE MICROSCOPIC-ADD ON     Status: Abnormal   Collection Time    10/22/13  1:59 AM      Result Value Ref Range   Squamous Epithelial / LPF FEW (*) RARE   WBC, UA 11-20  <3 WBC/hpf   RBC / HPF 11-20  <3 RBC/hpf   Bacteria, UA FEW (*) RARE   Urine-Other MUCOUS PRESENT     Comment: FEW YEAST  CBC WITH DIFFERENTIAL     Status: Abnormal   Collection Time    10/22/13  2:38 AM      Result Value Ref Range   WBC 15.9 (*)  4.0 - 10.5 K/uL   RBC 3.55 (*) 3.87 - 5.11 MIL/uL   Hemoglobin 9.6 (*) 12.0 - 15.0 g/dL   HCT 29.8 (*) 36.0 - 46.0 %   MCV 83.9  78.0 - 100.0 fL   MCH 27.0  26.0 - 34.0 pg   MCHC 32.2  30.0 - 36.0 g/dL   RDW 13.4  11.5 - 15.5 %   Platelets 491 (*) 150 - 400 K/uL   Neutrophils Relative % 76  43 - 77 %   Neutro Abs 12.0 (*) 1.7 - 7.7 K/uL   Lymphocytes Relative 13  12 - 46 %   Lymphs Abs 2.1  0.7 - 4.0 K/uL   Monocytes Relative 11  3 - 12 %   Monocytes Absolute 1.8 (*) 0.1 - 1.0 K/uL   Eosinophils Relative 0  0 - 5 %   Eosinophils Absolute 0.0  0.0 - 0.7 K/uL   Basophils Relative 0  0 - 1 %   Basophils Absolute 0.0  0.0 - 0.1 K/uL  COMPREHENSIVE METABOLIC PANEL     Status: Abnormal   Collection Time    10/22/13  2:38 AM      Result Value Ref Range   Sodium 133 (*) 137 - 147 mEq/L   Potassium 4.1  3.7 - 5.3 mEq/L   Chloride 95 (*) 96 - 112 mEq/L   CO2 23  19 - 32 mEq/L   Glucose, Bld 306 (*) 70 - 99 mg/dL   BUN 9  6 - 23 mg/dL   Creatinine, Ser 0.48 (*) 0.50 - 1.10 mg/dL   Calcium 8.8  8.4 - 10.5 mg/dL   Total Protein 7.1  6.0 - 8.3 g/dL   Albumin 2.4 (*) 3.5 - 5.2 g/dL   AST 12  0 - 37 U/L   ALT 10  0 - 35 U/L   Alkaline Phosphatase 82  39 - 117 U/L   Total Bilirubin 0.3  0.3 - 1.2 mg/dL   GFR calc non Af Amer >90  >90 mL/min   GFR calc Af Amer >90  >90 mL/min   Comment: (NOTE)     The eGFR has been calculated using the CKD EPI equation.     This calculation has not been validated in all clinical situations.     eGFR's persistently <90 mL/min signify possible Chronic Kidney     Disease.  I-STAT CG4 LACTIC ACID, ED     Status: None   Collection Time    10/22/13  3:22 AM      Result Value Ref Range   Lactic Acid, Venous 1.31  0.5 - 2.2 mmol/L  CBG MONITORING, ED     Status: Abnormal   Collection Time    10/22/13  6:12 AM      Result Value Ref Range   Glucose-Capillary 259 (*) 70 - 99 mg/dL   Comment 1 Notify RN     Comment 2 Documented in Chart    GLUCOSE,  CAPILLARY     Status: Abnormal   Collection Time    10/22/13  7:39 AM      Result Value Ref Range   Glucose-Capillary 258 (*) 70 - 99 mg/dL   Comment 1 Notify RN     Comment 2 Documented in Chart    GLUCOSE, CAPILLARY     Status: Abnormal   Collection Time    10/22/13 10:58 AM      Result Value Ref Range   Glucose-Capillary 257 (*) 70 - 99 mg/dL    Radiology/Results: Dg Chest 2 View  10/22/2013   CLINICAL DATA:  Right breast pain and swelling.  Fever.  EXAM: CHEST  2 VIEW  COMPARISON:  06/13/2010  FINDINGS: The right breast is asymmetrically enlarged with extensive subcutaneous gas. This creates increased density over the right chest. There is no evidence of pneumonia in the lateral projection. No cardiomegaly. No edema, effusion, or pneumothorax.  Critical Value/emergent results were called by telephone at the time of interpretation on 10/22/2013 at 4:17 AM to Dr. Kathrynn Humble , who verbally acknowledged these results.  IMPRESSION: Right breast swelling with extensive subcutaneous gas. If no recent intervention, findings concerning for necrotizing infection.   Electronically Signed   By: Jorje Guild M.D.   On: 10/22/2013 04:17    Anti-infectives: Anti-infectives   Start     Dose/Rate Route Frequency Ordered Stop   10/22/13 0430  ceFEPIme (MAXIPIME) 1 g in dextrose 5 % 50 mL IVPB     1 g 100 mL/hr over 30 Minutes Intravenous  Once 10/22/13 0423 10/22/13 0552   10/22/13 0430  vancomycin (VANCOCIN) IVPB 1000 mg/200 mL premix  1,000 mg 200 mL/hr over 60 Minutes Intravenous  Once 10/22/13 0423     10/22/13 0430  metroNIDAZOLE (FLAGYL) IVPB 500 mg  Status:  Discontinued     500 mg 100 mL/hr over 60 Minutes Intravenous  Once 10/22/13 0423 10/22/13 0424   10/22/13 0430  [MAR Hold]  clindamycin (CLEOCIN) IVPB 900 mg     (On MAR Hold since 10/22/13 0615)   900 mg 100 mL/hr over 30 Minutes Intravenous  Once 10/22/13 0424 10/22/13 9150      Assessment/Plan: Problem List: Patient Active  Problem List   Diagnosis Date Noted  . Breast abscess of female 10/22/2013  . HYPERLIPIDEMIA 07/16/2010  . ATELECTASIS 07/16/2010  . DIABETES MELLITUS, TYPE II 06/24/2010  . 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 HYPOXEMIA 06/24/2010  . SMALL BOWEL OBSTRUCTION, HX OF 06/24/2010    Abscess drained last night.  Stable postop. Day of Surgery    LOS: 0 days   Matt B. Hassell Done, MD, [facility] [phone] beeper [phone]  10/22/2013 11:35 AM

## 2013-10-22 NOTE — Transfer of Care (Signed)
Immediate Anesthesia Transfer of Care Note  Patient: Carmen Stone  Procedure(s) Performed: Procedure(s): IRRIGATION AND DRAINAGE AND DEBRIDEMENT RIGHT BREAST WOUND (Right) BREAST BIOPSY (Right)  Patient Location: PACU  Anesthesia Type:General  Level of Consciousness: sedated  Airway & Oxygen Therapy: Patient Spontanous Breathing and Patient connected to nasal cannula oxygen  Post-op Assessment: Report given to PACU RN and Post -op Vital signs reviewed and stable  Post vital signs: Reviewed and stable  Complications: No apparent anesthesia complications

## 2013-10-22 NOTE — Progress Notes (Signed)
At 1015, gown, binder soaked with serous drainage, reinforced with ABD's, new binder ordered. At 1200 more drainage and gown, under linen wet.  Top of dressing removed, packing in place, 4x4's  X4, 2abd apl., binder places with extra ABD to catch further drainage.

## 2013-10-22 NOTE — ED Provider Notes (Addendum)
CSN: [id number]     Arrival date & time 10/22/13  0126 History   First MD Initiated Contact with Patient 10/22/13 [phone]     Chief Complaint  Patient presents with  . Breast Discharge     (Consider location/radiation/quality/duration/timing/severity/associated sxs/prior Treatment) HPI Comments: 57 y/o with no medical hx comes in with cc of breast pain and drainage - right side. She has no medical hx, no personal of cancer. States that she started noting right sided breast redness and pain on Monday, which has gradually gotten worse. She started having foul smelling discharge earlier in the day. No trauma. No ivda.   The history is provided by the patient.    History reviewed. No pertinent past medical history. Past Surgical History  Procedure Laterality Date  . Cesarean section     No family history on file. History  Substance Use Topics  . Smoking status: Never Smoker   . Smokeless tobacco: Not on file  . Alcohol Use: No   OB History   Grav Para Term Preterm Abortions TAB SAB Ect Mult Living                 Review of Systems  Constitutional: Positive for fever, activity change and fatigue.  Respiratory: Negative for shortness of breath.   Cardiovascular: Negative for chest pain.  Gastrointestinal: Negative for nausea, vomiting and abdominal pain.  Genitourinary: Negative for dysuria.  Musculoskeletal: Negative for neck pain.  Skin: Positive for rash.  Neurological: Negative for headaches.  All other systems reviewed and are negative.     Allergies  Review of patient's allergies indicates no known allergies.  Home Medications   Prior to Admission medications   Medication Sig Start Date End Date Taking? Authorizing Provider  acetaminophen (TYLENOL) 325 MG tablet Take 650 mg by mouth every 6 (six) hours as needed for mild pain.   Yes Historical Provider, MD   BP 155/52  Pulse 122  Temp(Src) 100.1 F (37.8 C) (Oral)  Resp 18  Ht 5\' 4"  (1.626 m)  Wt 164 lb 5 oz  (74.532 kg)  BMI 28.19 kg/m2  SpO2 96% Physical Exam  Nursing note and vitals reviewed. Constitutional: She is oriented to person, place, and time. She appears well-developed.  HENT:  Head: Normocephalic.  Eyes: Conjunctivae are normal.  Neck: Neck supple.  Cardiovascular: Regular rhythm.   Pulmonary/Chest: Effort normal.  Abdominal: Soft.  Neurological: She is alert and oriented to person, place, and time.  Skin:  Right breast is diffusely erythematous, with callor and induration. Pt has foul smelling drainage from the base of the breast.    ED Course  Procedures (including critical care time) Labs Review Labs Reviewed  URINALYSIS, ROUTINE W REFLEX MICROSCOPIC - Abnormal; Notable for the following:    Color, Urine ORANGE (*)    APPearance CLOUDY (*)    Specific Gravity, Urine 1.043 (*)    Glucose, UA >1000 (*)    Hgb urine dipstick MODERATE (*)    Bilirubin Urine MODERATE (*)    Ketones, ur 15 (*)    Protein, ur 100 (*)    Urobilinogen, UA 2.0 (*)    Leukocytes, UA SMALL (*)    All other components within normal limits  URINE MICROSCOPIC-ADD ON - Abnormal; Notable for the following:    Squamous Epithelial / LPF FEW (*)    Bacteria, UA FEW (*)    All other components within normal limits  CBC WITH DIFFERENTIAL - Abnormal; Notable for the following:  WBC 15.9 (*)    RBC 3.55 (*)    Hemoglobin 9.6 (*)    HCT 29.8 (*)    Platelets 491 (*)    Neutro Abs 12.0 (*)    Monocytes Absolute 1.8 (*)    All other components within normal limits  COMPREHENSIVE METABOLIC PANEL - Abnormal; Notable for the following:    Sodium 133 (*)    Chloride 95 (*)    Glucose, Bld 306 (*)    Creatinine, Ser 0.48 (*)    Albumin 2.4 (*)    All other components within normal limits  CULTURE, BLOOD (ROUTINE X 2)  CULTURE, BLOOD (ROUTINE X 2)  URINE CULTURE  WOUND CULTURE  I-STAT CG4 LACTIC ACID, ED    Imaging Review Dg Chest 2 View  10/22/2013   CLINICAL DATA:  Right breast pain and  swelling.  Fever.  EXAM: CHEST  2 VIEW  COMPARISON:  06/13/2010  FINDINGS: The right breast is asymmetrically enlarged with extensive subcutaneous gas. This creates increased density over the right chest. There is no evidence of pneumonia in the lateral projection. No cardiomegaly. No edema, effusion, or pneumothorax.  Critical Value/emergent results were called by telephone at the time of interpretation on 10/22/2013 at 4:17 AM to Dr. Kathrynn Humble , who verbally acknowledged these results.  IMPRESSION: Right breast swelling with extensive subcutaneous gas. If no recent intervention, findings concerning for necrotizing infection.   Electronically Signed   By: Jorje Guild M.D.   On: 10/22/2013 04:17     EKG Interpretation None      MDM   Final diagnoses:  Necrotizing cellulitis  Breast infection in female  Sepsis Possible breast cancer  Pt comes in with right breast pain and drainage. Xray shows subcutaneous air. Concerns for necrotizing infection, and possible underlying cancer. Pt is immunocompetent. Vanc, Clinda and cefepime started. Surgery consulted. CT chest ordered for now.   CRITICAL CARE Performed by: Monay Houlton   Total critical care time: 45 min - for necrotizing fascitis of the right breast  Critical care time was exclusive of separately billable procedures and treating other patients.  Critical care was necessary to treat or prevent imminent or life-threatening deterioration.  Critical care was time spent personally by me on the following activities: development of treatment plan with patient and/or surrogate as well as nursing, discussions with consultants, evaluation of patient's response to treatment, examination of patient, obtaining history from patient or surrogate, ordering and performing treatments and interventions, ordering and review of laboratory studies, ordering and review of radiographic studies, pulse oximetry and re-evaluation of patient's  condition.   Varney Biles, MD 10/22/13 Klem[place], MD 10/22/13 [phone]

## 2013-10-22 NOTE — Progress Notes (Signed)
2 label bags of belongings, cane and dentures taken to PACU and signed in. Per Gerald Stabs, RN in ED patient has additional valuables that they secured/ took care of.

## 2013-10-22 NOTE — H&P (Signed)
Carmen Stone is an 58 y.o. female.   Chief Complaint: right breast drainage HPI: 58 yo AAF who states she was in her usual state of health until this past Monday when she noticed some rt breast swelling and pain. Over the past several days the area has worsened and started draining fluid. She denies any trauma to area. She states that her Rt breast appeared the same as the left breast just until this past Monday.   She denies any medical history but when I told her chart documents her being a diabetic - she states she was unaware  She states her last mammogram was 5 yrs ago. She denies any family hx of breast ca or other malignancy. She denies any wt loss, LAD, nipple drainage.   Past Medical History  Diagnosis Date  . Diabetes mellitus, type II     Past Surgical History  Procedure Laterality Date  . Cesarean section    . Ventral hernia repair  2011    repair incacerated VH with biologic mesh; cholecystectomy  . Cholecystectomy  2011    No family history on file. Social History:  reports that she has never smoked. She does not have any smokeless tobacco history on file. She reports that she does not drink alcohol or use illicit drugs.  Allergies: No Known Allergies   (Not in a hospital admission)  Results for orders placed during the hospital encounter of 10/22/13 (from the past 48 hour(s))  URINALYSIS, ROUTINE W REFLEX MICROSCOPIC     Status: Abnormal   Collection Time    10/22/13  1:59 AM      Result Value Ref Range   Color, Urine ORANGE (*) YELLOW   Comment: BIOCHEMICALS MAY BE AFFECTED BY COLOR   APPearance CLOUDY (*) CLEAR   Specific Gravity, Urine 1.043 (*) 1.005 - 1.030   pH 5.5  5.0 - 8.0   Glucose, UA >1000 (*) NEGATIVE mg/dL   Hgb urine dipstick MODERATE (*) NEGATIVE   Bilirubin Urine MODERATE (*) NEGATIVE   Ketones, ur 15 (*) NEGATIVE mg/dL   Protein, ur 100 (*) NEGATIVE mg/dL   Urobilinogen, UA 2.0 (*) 0.0 - 1.0 mg/dL   Nitrite NEGATIVE  NEGATIVE   Leukocytes, UA SMALL (*) NEGATIVE  URINE MICROSCOPIC-ADD ON     Status: Abnormal   Collection Time    10/22/13  1:59 AM      Result Value Ref Range   Squamous Epithelial / LPF FEW (*) RARE   WBC, UA 11-20  <3 WBC/hpf   RBC / HPF 11-20  <3 RBC/hpf   Bacteria, UA FEW (*) RARE   Urine-Other MUCOUS PRESENT     Comment: FEW YEAST  CBC WITH DIFFERENTIAL     Status: Abnormal   Collection Time    10/22/13  2:38 AM      Result Value Ref Range   WBC 15.9 (*) 4.0 - 10.5 K/uL   RBC 3.55 (*) 3.87 - 5.11 MIL/uL   Hemoglobin 9.6 (*) 12.0 - 15.0 g/dL   HCT 29.8 (*) 36.0 - 46.0 %   MCV 83.9  78.0 - 100.0 fL   MCH 27.0  26.0 - 34.0 pg   MCHC 32.2  30.0 - 36.0 g/dL   RDW 13.4  11.5 - 15.5 %   Platelets 491 (*) 150 - 400 K/uL   Neutrophils Relative % 76  43 - 77 %   Neutro Abs 12.0 (*) 1.7 - 7.7 K/uL   Lymphocytes Relative 13  12 -  46 %   Lymphs Abs 2.1  0.7 - 4.0 K/uL   Monocytes Relative 11  3 - 12 %   Monocytes Absolute 1.8 (*) 0.1 - 1.0 K/uL   Eosinophils Relative 0  0 - 5 %   Eosinophils Absolute 0.0  0.0 - 0.7 K/uL   Basophils Relative 0  0 - 1 %   Basophils Absolute 0.0  0.0 - 0.1 K/uL  COMPREHENSIVE METABOLIC PANEL     Status: Abnormal   Collection Time    10/22/13  2:38 AM      Result Value Ref Range   Sodium 133 (*) 137 - 147 mEq/L   Potassium 4.1  3.7 - 5.3 mEq/L   Chloride 95 (*) 96 - 112 mEq/L   CO2 23  19 - 32 mEq/L   Glucose, Bld 306 (*) 70 - 99 mg/dL   BUN 9  6 - 23 mg/dL   Creatinine, Ser 0.48 (*) 0.50 - 1.10 mg/dL   Calcium 8.8  8.4 - 10.5 mg/dL   Total Protein 7.1  6.0 - 8.3 g/dL   Albumin 2.4 (*) 3.5 - 5.2 g/dL   AST 12  0 - 37 U/L   ALT 10  0 - 35 U/L   Alkaline Phosphatase 82  39 - 117 U/L   Total Bilirubin 0.3  0.3 - 1.2 mg/dL   GFR calc non Af Amer >90  >90 mL/min   GFR calc Af Amer >90  >90 mL/min   Comment: (NOTE)     The eGFR has been calculated using the CKD EPI equation.     This calculation has not been validated in all clinical situations.      eGFR's persistently <90 mL/min signify possible Chronic Kidney     Disease.  I-STAT CG4 LACTIC ACID, ED     Status: None   Collection Time    10/22/13  3:22 AM      Result Value Ref Range   Lactic Acid, Venous 1.31  0.5 - 2.2 mmol/L   Dg Chest 2 View  10/22/2013   CLINICAL DATA:  Right breast pain and swelling.  Fever.  EXAM: CHEST  2 VIEW  COMPARISON:  06/13/2010  FINDINGS: The right breast is asymmetrically enlarged with extensive subcutaneous gas. This creates increased density over the right chest. There is no evidence of pneumonia in the lateral projection. No cardiomegaly. No edema, effusion, or pneumothorax.  Critical Value/emergent results were called by telephone at the time of interpretation on 10/22/2013 at 4:17 AM to Dr. Kathrynn Humble , who verbally acknowledged these results.  IMPRESSION: Right breast swelling with extensive subcutaneous gas. If no recent intervention, findings concerning for necrotizing infection.   Electronically Signed   By: Jorje Guild M.D.   On: 10/22/2013 04:17    Review of Systems  Constitutional: Negative for fever, chills and weight loss.  HENT: Negative for nosebleeds.   Eyes: Negative for blurred vision.  Respiratory: Negative for shortness of breath.   Cardiovascular: Negative for chest pain, palpitations, orthopnea and PND.       Denies DOE  Gastrointestinal: Negative for nausea, vomiting and abdominal pain.  Genitourinary: Positive for frequency. Negative for dysuria and hematuria.  Musculoskeletal: Negative.   Skin: Negative for itching and rash.  Neurological: Negative for dizziness, focal weakness, seizures, loss of consciousness and headaches.       Denies TIAs, amaurosis fugax  Endo/Heme/Allergies: Does not bruise/bleed easily.  Psychiatric/Behavioral: The patient is not nervous/anxious.     Blood pressure  155/52, pulse 122, temperature 100.1 F (37.8 C), temperature source Oral, resp. rate 18, height '5\' 4"'  (1.626 m), weight 164 lb 5 oz  (74.532 kg), SpO2 96.00%. Physical Exam  Vitals reviewed. Constitutional: She is oriented to person, place, and time. She appears well-developed and well-nourished. She is cooperative.  Non-toxic appearance. She does not have a sickly appearance. No distress.  Malodorous in room; chaperone present - pt's nurse  HENT:  Head: Normocephalic and atraumatic.  Right Ear: External ear normal.  Left Ear: External ear normal.  Eyes: Conjunctivae are normal. No scleral icterus.  Neck: Normal range of motion. Neck supple. No tracheal deviation present. No thyromegaly present.  Cardiovascular: Normal rate and normal heart sounds.   Respiratory: Effort normal and breath sounds normal. No stridor. No respiratory distress. She has no wheezes. Right breast exhibits skin change and tenderness. Left breast exhibits no inverted nipple, no mass, no nipple discharge, no skin change and no tenderness. Breasts are asymmetrical.  Significantly enlarged rt breast with fullness/firmness in b/l upper quadrants. Has skin defect in lower inner quad along inframammary fold - wound is 4cm by 3cm - when press on breast - air comes out thru hole; significant cellulitis involving entire rt breast; also has "orange peel" appearance to skin of Rt breast; malodorous  GI: Soft. She exhibits no distension. There is no tenderness. There is no rebound. A hernia is present. Hernia confirmed positive in the ventral area.    Musculoskeletal: She exhibits no edema and no tenderness.  Lymphadenopathy:    She has no cervical adenopathy.    She has no axillary adenopathy.       Right: No supraclavicular adenopathy present.       Left: No supraclavicular adenopathy present.  Neurological: She is alert and oriented to person, place, and time. She has normal strength. She exhibits normal muscle tone. GCS eye subscore is 4. GCS verbal subscore is 5. GCS motor subscore is 6.  Skin: Skin is warm and dry. No rash noted. She is not diaphoretic. No  erythema. No pallor.  Psychiatric: She has a normal mood and affect. Her behavior is normal. Judgment and thought content normal.           Assessment/Plan Right breast necrotizing soft tissue infection SIRS DM Poor historian Ventral incisional hernias  She has large amount of crepitus and soft tissue gas concerning for necrotizing infection. Her breast skin is concerning for a possible inflammatory breast cancer. I question the pt's recollection;however, she is alert, ox4 (person, city,state, year, month, president)  i have recommended incision, drainage, debridement, and bx of the right breast in the OR.  IV abx Blood cx  Discussed risk/benefits with pt. Bleeding, infection, need for additional procedures, chronic wound, loss of nipple, breast deformity, possibility of cancer, blood clot formation, anethesia complications, open wound requiring wound care, injury to surrounding structures.   Leighton Ruff. Redmond Pulling, MD, FACS General, Bariatric, & Minimally Invasive Surgery [facility] [place]   [name] 10/22/2013, 5:40 AM

## 2013-10-22 NOTE — Anesthesia Procedure Notes (Signed)
Procedure Name: LMA Insertion Date/Time: 10/22/2013 6:34 AM Performed by: Marinda Elk A Pre-anesthesia Checklist: Patient identified, Timeout performed, Emergency Drugs available, Suction available and Patient being monitored Patient Re-evaluated:Patient Re-evaluated prior to inductionOxygen Delivery Method: Circle system utilized Preoxygenation: Pre-oxygenation with 100% oxygen Intubation Type: IV induction Ventilation: Mask ventilation without difficulty LMA Size: 4.0 Number of attempts: 1 Placement Confirmation: positive ETCO2 and breath sounds checked- equal and bilateral Tube secured with: Tape Dental Injury: Teeth and Oropharynx as per pre-operative assessment

## 2013-10-22 NOTE — Progress Notes (Signed)
ANTIBIOTIC CONSULT NOTE - INITIAL  Pharmacy Consult for vancomycin Indication: breast abscess  No Known Allergies  Patient Measurements: Height: 5\' 4"  (162.6 cm) Weight: 164 lb 5 oz (74.532 kg) IBW/kg (Calculated) : 54.7   Vital Signs: Temp: 98.6 F (37 C) (05/09 0928) Temp src: Oral (05/09 0928) BP: 110/51 mmHg (05/09 0928) Pulse Rate: 92 (05/09 0928) Intake/Output from previous day:   Intake/Output from this shift: Total I/O In: 740 [P.O.:240; I.V.:500] Out: 575 [Urine:500; Blood:75]  Labs:  Recent Labs  10/22/13 0238  WBC 15.9*  HGB 9.6*  PLT 491*  CREATININE 0.48*   Estimated Creatinine Clearance: 74.8 ml/min (by C-G formula based on Cr of 0.48). No results found for this basename: VANCOTROUGH, VANCOPEAK, VANCORANDOM, GENTTROUGH, GENTPEAK, GENTRANDOM, TOBRATROUGH, TOBRAPEAK, TOBRARND, AMIKACINPEAK, AMIKACINTROU, AMIKACIN,  in the last 72 hours   Microbiology: No results found for this or any previous visit (from the past 720 hour(s)).  Medical History: Past Medical History  Diagnosis Date  . Diabetes mellitus, type II     Medications:  Prescriptions prior to admission  Medication Sig Dispense Refill  . acetaminophen (TYLENOL) 325 MG tablet Take 650 mg by mouth every 6 (six) hours as needed for mild pain.       Assessment: 58 year old woman s/p debridement of breast abscess to receive vancomycin per pharmacy as well as clindamycin.  Goal of Therapy:  Vancomycin trough level 10-15 mcg/ml  Plan:  Measure antibiotic drug levels at steady state Follow up culture results Vancomycin 1g IV q12 Monitor renal function  Gearldine Bienenstock Devony Mcgrady 10/22/2013,11:43 AM

## 2013-10-22 NOTE — Anesthesia Preprocedure Evaluation (Signed)
Anesthesia Evaluation  Patient identified by MRN, date of birth, ID band Patient awake    Reviewed: Allergy & Precautions, H&P , NPO status , Patient's Chart, lab work & pertinent test results  Airway Mallampati: I TM Distance: >3 FB Neck ROM: Full    Dental  (+) Poor Dentition, Edentulous Upper, Edentulous Lower, Dental Advisory Given   Pulmonary  breath sounds clear to auscultation        Cardiovascular Rhythm:Regular Rate:Normal     Neuro/Psych    GI/Hepatic   Endo/Other  diabetes, Poorly Controlled, Type 2  Renal/GU      Musculoskeletal   Abdominal   Peds  Hematology   Anesthesia Other Findings   Reproductive/Obstetrics                           Anesthesia Physical Anesthesia Plan  ASA: III  Anesthesia Plan: General   Post-op Pain Management:    Induction: Intravenous  Airway Management Planned: LMA  Additional Equipment:   Intra-op Plan:   Post-operative Plan: Extubation in OR  Informed Consent: I have reviewed the patients History and Physical, chart, labs and discussed the procedure including the risks, benefits and alternatives for the proposed anesthesia with the patient or authorized representative who has indicated his/her understanding and acceptance.   Dental advisory given  Plan Discussed with: CRNA, Anesthesiologist and Surgeon  Anesthesia Plan Comments:         Anesthesia Quick Evaluation

## 2013-10-22 NOTE — ED Notes (Signed)
The pt wants her purse and its contents given to her brother who is on the way here

## 2013-10-22 NOTE — ED Notes (Signed)
To the or

## 2013-10-22 NOTE — ED Notes (Signed)
C/o R breast swelling, pain, and dark yellow discharge from R breast x 1 week.  Also reports pain after urination/wiping.

## 2013-10-22 NOTE — ED Notes (Signed)
The pt has had rt breast pain since Monday.  She reports that her rt breast started draining today.  The rt breast is red and angry appearing and the skin on the rt breast has orange peel skin.  Very bad odor

## 2013-10-22 NOTE — Anesthesia Postprocedure Evaluation (Signed)
Anesthesia Post Note  Patient: Carmen Stone  Procedure(s) Performed: Procedure(s) (LRB): IRRIGATION AND DRAINAGE AND DEBRIDEMENT RIGHT BREAST WOUND (Right) BREAST BIOPSY (Right)  Anesthesia type: General  Patient location: PACU  Post pain: Pain level controlled and Adequate analgesia  Post assessment: Post-op Vital signs reviewed, Patient's Cardiovascular Status Stable, Respiratory Function Stable, Patent Airway and Pain level controlled  Last Vitals:  Filed Vitals:   10/22/13 0815  BP: 98/51  Pulse: 84  Temp:   Resp: 15    Post vital signs: Reviewed and stable  Level of consciousness: awake, alert  and oriented  Complications: No apparent anesthesia complications

## 2013-10-23 LAB — GLUCOSE, CAPILLARY
GLUCOSE-CAPILLARY: 308 mg/dL — AB (ref 70–99)
Glucose-Capillary: 229 mg/dL — ABNORMAL HIGH (ref 70–99)
Glucose-Capillary: 277 mg/dL — ABNORMAL HIGH (ref 70–99)
Glucose-Capillary: 252 mg/dL — ABNORMAL HIGH (ref 70–99)

## 2013-10-23 MED ORDER — INSULIN ASPART 100 UNIT/ML ~~LOC~~ SOLN
0.0000 [IU] | Freq: Every day | SUBCUTANEOUS | Status: DC
  Administered 2013-10-23: 3 [IU] via SUBCUTANEOUS

## 2013-10-23 MED ORDER — INSULIN GLARGINE 100 UNIT/ML ~~LOC~~ SOLN
10.0000 [IU] | Freq: Every day | SUBCUTANEOUS | Status: DC
  Administered 2013-10-23: 10 [IU] via SUBCUTANEOUS
  Filled 2013-10-23 (×2): qty 0.1

## 2013-10-23 MED ORDER — BISACODYL 10 MG RE SUPP: 10.0000 mg | Freq: Two times a day (BID) | RECTAL | Status: DC | PRN

## 2013-10-23 MED ORDER — ENOXAPARIN SODIUM 40 MG/0.4ML ~~LOC~~ SOLN
40.0000 mg | SUBCUTANEOUS | Status: DC
  Administered 2013-10-23: 40 mg via SUBCUTANEOUS
  Filled 2013-10-23 (×2): qty 0.4

## 2013-10-23 MED ORDER — METOPROLOL TARTRATE 1 MG/ML IV SOLN: 5.0000 mg | Freq: Four times a day (QID) | INTRAVENOUS | Status: DC | PRN

## 2013-10-23 MED ORDER — MORPHINE SULFATE 2 MG/ML IJ SOLN
2.0000 mg | INTRAMUSCULAR | Status: DC | PRN
  Administered 2013-10-26 (×2): 2 mg via INTRAVENOUS
  Administered 2013-10-27: 4 mg via INTRAVENOUS
  Filled 2013-10-23: qty 1
  Filled 2013-10-23: qty 2
  Filled 2013-10-23: qty 1

## 2013-10-23 MED ORDER — ACETAMINOPHEN 500 MG PO TABS
1000.0000 mg | ORAL_TABLET | Freq: Three times a day (TID) | ORAL | Status: DC
  Administered 2013-10-23 (×2): 1000 mg via ORAL
  Filled 2013-10-23 (×5): qty 2

## 2013-10-23 MED ORDER — LACTATED RINGERS IV BOLUS (SEPSIS): 1000.0000 mL | Freq: Three times a day (TID) | INTRAVENOUS | Status: DC | PRN

## 2013-10-23 MED ORDER — POLYETHYLENE GLYCOL 3350 17 G PO PACK: 17.0000 g | PACK | Freq: Two times a day (BID) | ORAL | Status: DC | PRN

## 2013-10-23 MED ORDER — OXYCODONE HCL 5 MG PO TABS
5.0000 mg | ORAL_TABLET | ORAL | Status: DC | PRN
  Administered 2013-10-23: 10 mg via ORAL
  Administered 2013-10-24: 5 mg via ORAL
  Administered 2013-10-25: 10 mg via ORAL
  Administered 2013-10-26: 5 mg via ORAL
  Administered 2013-10-26 – 2013-11-03 (×18): 10 mg via ORAL
  Administered 2013-11-04: 5 mg via ORAL
  Filled 2013-10-23 (×8): qty 2
  Filled 2013-10-23: qty 1
  Filled 2013-10-23 (×7): qty 2
  Filled 2013-10-23 (×3): qty 1
  Filled 2013-10-23 (×5): qty 2

## 2013-10-23 MED ORDER — NON FORMULARY: Freq: Two times a day (BID) | Status: DC

## 2013-10-23 MED ORDER — ALUM & MAG HYDROXIDE-SIMETH 200-200-20 MG/5ML PO SUSP: 30.0000 mL | Freq: Four times a day (QID) | ORAL | Status: DC | PRN

## 2013-10-23 MED ORDER — MAGIC MOUTHWASH
15.0000 mL | Freq: Four times a day (QID) | ORAL | Status: DC | PRN
Start: 2013-10-23 — End: 2013-10-24
  Filled 2013-10-23: qty 15

## 2013-10-23 MED ORDER — INSULIN ASPART 100 UNIT/ML ~~LOC~~ SOLN
0.0000 [IU] | Freq: Three times a day (TID) | SUBCUTANEOUS | Status: DC
  Administered 2013-10-23: 15 [IU] via SUBCUTANEOUS
  Administered 2013-10-24: 11 [IU] via SUBCUTANEOUS
  Administered 2013-10-24 – 2013-10-25 (×2): 4 [IU] via SUBCUTANEOUS
  Administered 2013-10-25: 3 [IU] via SUBCUTANEOUS
  Administered 2013-10-25: 7 [IU] via SUBCUTANEOUS
  Administered 2013-10-26: 4 [IU] via SUBCUTANEOUS
  Administered 2013-10-26: 3 [IU] via SUBCUTANEOUS
  Administered 2013-10-27 – 2013-10-28 (×4): 4 [IU] via SUBCUTANEOUS
  Administered 2013-10-28: 3 [IU] via SUBCUTANEOUS
  Administered 2013-10-29: 4 [IU] via SUBCUTANEOUS
  Administered 2013-10-29 – 2013-11-03 (×7): 3 [IU] via SUBCUTANEOUS

## 2013-10-23 MED ORDER — LIVING WELL WITH DIABETES BOOK
Freq: Once | Status: DC
  Filled 2013-10-23: qty 1

## 2013-10-23 MED ORDER — DAKINS (1/4 STRENGTH) 0.125 % EX SOLN
Freq: Two times a day (BID) | CUTANEOUS | Status: DC
  Administered 2013-10-23 (×2)
  Filled 2013-10-23: qty 473

## 2013-10-23 MED ORDER — LIP MEDEX EX OINT: 1.0000 "application " | TOPICAL_OINTMENT | Freq: Two times a day (BID) | CUTANEOUS | Status: DC

## 2013-10-23 MED ORDER — BLISTEX MEDICATED EX OINT
TOPICAL_OINTMENT | Freq: Two times a day (BID) | CUTANEOUS | Status: DC | PRN
  Filled 2013-10-23: qty 10

## 2013-10-23 MED ORDER — DIPHENHYDRAMINE HCL 50 MG/ML IJ SOLN
12.5000 mg | Freq: Four times a day (QID) | INTRAMUSCULAR | Status: DC | PRN
  Administered 2013-10-27: 25 mg via INTRAVENOUS
  Filled 2013-10-23: qty 1

## 2013-10-23 NOTE — Brief Op Note (Signed)
10/22/2013  6:00 PM  PATIENT:  Carmen Stone  58 y.o. female  PRE-OPERATIVE DIAGNOSIS:  Necrotizing soft tissue infection of right breast  POST-OPERATIVE DIAGNOSIS:  Same, possible breast cancer  PROCEDURE:  Procedure(s): IRRIGATION AND DRAINAGE AND DEBRIDEMENT RIGHT BREAST WOUND with scalpel (5 x 3cm) (Right) BREAST BIOPSY (Right)  See pic below  SURGEON:  Surgeon(s) and Role:    * [name], MD - Primary  PHYSICIAN ASSISTANT: none  ASSISTANTS: none   ANESTHESIA:   general  EBL:  Total I/O In: 840 [P.O.:840] Out: 1 [Urine:1]  BLOOD ADMINISTERED:none  DRAINS: none   LOCAL MEDICATIONS USED:  NONE  SPECIMEN:  Source of Specimen:  right breast skin and subcu tissue; aerobic/anerobic cultures of rt breast wound and Excision  DISPOSITION OF SPECIMEN:  pathology and micro  COUNTS:  YES  TOURNIQUET:  * No tourniquets in log *  DICTATION: .Other Dictation: Dictation Number [id number]  PLAN OF CARE: Admit to inpatient   PATIENT DISPOSITION:  PACU - hemodynamically stable.   Delay start of Pharmacological VTE agent (>24hrs) due to surgical blood loss or risk of bleeding: no       Leighton Ruff. Redmond Pulling, MD, FACS General, Bariatric, & Minimally Invasive Surgery [facility], [place]

## 2013-10-23 NOTE — Progress Notes (Signed)
Reinforced dressing/ copious amount of foul serosang  Drainage. Removed outer dressing.replaced 4x4 and abd and binder/ [employer]

## 2013-10-23 NOTE — Progress Notes (Signed)
Patient ID: Carmen Stone, female   DOB: 1956-01-18, 58 y.o.   MRN: [id number]   Subjective: CBGs are high.  Tolerating a diet.  febrile.    Objective:  Vital signs:  Filed Vitals:   10/22/13 2126 10/23/13 0236 10/23/13 0621 10/23/13 1114  BP: 112/62 110/91 113/52 98/48  Pulse: 103 92 95 99  Temp: 100.1 F (37.8 C) 98.7 F (37.1 C) 99.7 F (37.6 C) 98.5 F (36.9 C)  TempSrc: Oral Oral Oral Oral  Resp: '17 17 18 16  ' Height:      Weight:      SpO2: 98% 94% 95% 98%    Last BM Date: 10/22/13  Intake/Output   Yesterday:  05/09 0701 - 05/10 0700 In: 2758 [P.O.:1320; I.V.:1438] Out: 575 [Urine:500; Blood:75] This shift:  Total I/O In: 480 [P.O.:480] Out: 1 [Urine:1]  Physical Exam: General: Pt awake/alert/oriented x4 in no acute distress Skin: right breast moderate amount of edema, erythema, wound is foul smelling, areas of necrosis   Problem List:   Active Problems:   Breast abscess of female    Results:   Labs: Results for orders placed during the hospital encounter of 10/22/13 (from the past 48 hour(s))  URINALYSIS, ROUTINE W REFLEX MICROSCOPIC     Status: Abnormal   Collection Time    10/22/13  1:59 AM      Result Value Ref Range   Color, Urine ORANGE (*) YELLOW   Comment: BIOCHEMICALS MAY BE AFFECTED BY COLOR   APPearance CLOUDY (*) CLEAR   Specific Gravity, Urine 1.043 (*) 1.005 - 1.030   pH 5.5  5.0 - 8.0   Glucose, UA >1000 (*) NEGATIVE mg/dL   Hgb urine dipstick MODERATE (*) NEGATIVE   Bilirubin Urine MODERATE (*) NEGATIVE   Ketones, ur 15 (*) NEGATIVE mg/dL   Protein, ur 100 (*) NEGATIVE mg/dL   Urobilinogen, UA 2.0 (*) 0.0 - 1.0 mg/dL   Nitrite NEGATIVE  NEGATIVE   Leukocytes, UA SMALL (*) NEGATIVE  URINE MICROSCOPIC-ADD ON     Status: Abnormal   Collection Time    10/22/13  1:59 AM      Result Value Ref Range   Squamous Epithelial / LPF FEW (*) RARE   WBC, UA 11-20  <3 WBC/hpf   RBC / HPF 11-20  <3 RBC/hpf   Bacteria, UA FEW (*)  RARE   Urine-Other MUCOUS PRESENT     Comment: FEW YEAST  CBC WITH DIFFERENTIAL     Status: Abnormal   Collection Time    10/22/13  2:38 AM      Result Value Ref Range   WBC 15.9 (*) 4.0 - 10.5 K/uL   RBC 3.55 (*) 3.87 - 5.11 MIL/uL   Hemoglobin 9.6 (*) 12.0 - 15.0 g/dL   HCT 29.8 (*) 36.0 - 46.0 %   MCV 83.9  78.0 - 100.0 fL   MCH 27.0  26.0 - 34.0 pg   MCHC 32.2  30.0 - 36.0 g/dL   RDW 13.4  11.5 - 15.5 %   Platelets 491 (*) 150 - 400 K/uL   Neutrophils Relative % 76  43 - 77 %   Neutro Abs 12.0 (*) 1.7 - 7.7 K/uL   Lymphocytes Relative 13  12 - 46 %   Lymphs Abs 2.1  0.7 - 4.0 K/uL   Monocytes Relative 11  3 - 12 %   Monocytes Absolute 1.8 (*) 0.1 - 1.0 K/uL   Eosinophils Relative 0  0 - 5 %  Eosinophils Absolute 0.0  0.0 - 0.7 K/uL   Basophils Relative 0  0 - 1 %   Basophils Absolute 0.0  0.0 - 0.1 K/uL  COMPREHENSIVE METABOLIC PANEL     Status: Abnormal   Collection Time    10/22/13  2:38 AM      Result Value Ref Range   Sodium 133 (*) 137 - 147 mEq/L   Potassium 4.1  3.7 - 5.3 mEq/L   Chloride 95 (*) 96 - 112 mEq/L   CO2 23  19 - 32 mEq/L   Glucose, Bld 306 (*) 70 - 99 mg/dL   BUN 9  6 - 23 mg/dL   Creatinine, Ser 0.48 (*) 0.50 - 1.10 mg/dL   Calcium 8.8  8.4 - 10.5 mg/dL   Total Protein 7.1  6.0 - 8.3 g/dL   Albumin 2.4 (*) 3.5 - 5.2 g/dL   AST 12  0 - 37 U/L   ALT 10  0 - 35 U/L   Alkaline Phosphatase 82  39 - 117 U/L   Total Bilirubin 0.3  0.3 - 1.2 mg/dL   GFR calc non Af Amer >90  >90 mL/min   GFR calc Af Amer >90  >90 mL/min   Comment: (NOTE)     The eGFR has been calculated using the CKD EPI equation.     This calculation has not been validated in all clinical situations.     eGFR's persistently <90 mL/min signify possible Chronic Kidney     Disease.  CULTURE, BLOOD (ROUTINE X 2)     Status: None   Collection Time    10/22/13  3:03 AM      Result Value Ref Range   Specimen Description BLOOD LEFT ANTECUBITAL     Special Requests BOTTLES DRAWN  AEROBIC AND ANAEROBIC 10CC EACH     Culture  Setup Time       Value: 10/22/2013 12:09     Performed at [facility]   Culture       Value:        BLOOD CULTURE RECEIVED NO GROWTH TO DATE CULTURE WILL BE HELD FOR 5 DAYS BEFORE ISSUING A FINAL NEGATIVE REPORT     Performed at [facility]   Report Status PENDING    CULTURE, BLOOD (ROUTINE X 2)     Status: None   Collection Time    10/22/13  3:08 AM      Result Value Ref Range   Specimen Description BLOOD RIGHT ANTECUBITAL     Special Requests BOTTLES DRAWN AEROBIC ONLY 10CC     Culture  Setup Time       Value: 10/22/2013 12:09     Performed at [facility]   Culture       Value:        BLOOD CULTURE RECEIVED NO GROWTH TO DATE CULTURE WILL BE HELD FOR 5 DAYS BEFORE ISSUING A FINAL NEGATIVE REPORT     Performed at [facility]   Report Status PENDING    I-STAT CG4 LACTIC ACID, ED     Status: None   Collection Time    10/22/13  3:22 AM      Result Value Ref Range   Lactic Acid, Venous 1.31  0.5 - 2.2 mmol/L  WOUND CULTURE     Status: None   Collection Time    10/22/13  4:35 AM      Result Value Ref Range   Specimen Description WOUND  Special Requests BREAST     Gram Stain PENDING     Culture       Value: Culture reincubated for better growth     Performed at [facility]   Report Status PENDING    CBG MONITORING, ED     Status: Abnormal   Collection Time    10/22/13  6:12 AM      Result Value Ref Range   Glucose-Capillary 259 (*) 70 - 99 mg/dL   Comment 1 Notify RN     Comment 2 Documented in Chart    ANAEROBIC CULTURE     Status: None   Collection Time    10/22/13  6:46 AM      Result Value Ref Range   Specimen Description WOUND BREAST RIGHT     Special Requests NONE     Gram Stain PENDING     Culture       Value: NO ANAEROBES ISOLATED; CULTURE IN PROGRESS FOR 5 DAYS     Performed at [facility]   Report Status PENDING    WOUND CULTURE     Status: None    Collection Time    10/22/13  6:46 AM      Result Value Ref Range   Specimen Description WOUND BREAST RIGHT     Special Requests NONE     Gram Stain PENDING     Culture       Value: Culture reincubated for better growth     Performed at [facility]   Report Status PENDING    GLUCOSE, CAPILLARY     Status: Abnormal   Collection Time    10/22/13  7:39 AM      Result Value Ref Range   Glucose-Capillary 258 (*) 70 - 99 mg/dL   Comment 1 Notify RN     Comment 2 Documented in Chart    GLUCOSE, CAPILLARY     Status: Abnormal   Collection Time    10/22/13 10:58 AM      Result Value Ref Range   Glucose-Capillary 257 (*) 70 - 99 mg/dL  GLUCOSE, CAPILLARY     Status: Abnormal   Collection Time    10/22/13  5:34 PM      Result Value Ref Range   Glucose-Capillary 251 (*) 70 - 99 mg/dL  GLUCOSE, CAPILLARY     Status: Abnormal   Collection Time    10/22/13 10:22 PM      Result Value Ref Range   Glucose-Capillary 201 (*) 70 - 99 mg/dL  GLUCOSE, CAPILLARY     Status: Abnormal   Collection Time    10/23/13  7:36 AM      Result Value Ref Range   Glucose-Capillary 229 (*) 70 - 99 mg/dL    Imaging / Studies: Dg Chest 2 View  10/22/2013   CLINICAL DATA:  Right breast pain and swelling.  Fever.  EXAM: CHEST  2 VIEW  COMPARISON:  06/13/2010  FINDINGS: The right breast is asymmetrically enlarged with extensive subcutaneous gas. This creates increased density over the right chest. There is no evidence of pneumonia in the lateral projection. No cardiomegaly. No edema, effusion, or pneumothorax.  Critical Value/emergent results were called by telephone at the time of interpretation on 10/22/2013 at 4:17 AM to Dr. Kathrynn Humble , who verbally acknowledged these results.  IMPRESSION: Right breast swelling with extensive subcutaneous gas. If no recent intervention, findings concerning for necrotizing infection.   Electronically Signed   By: Roderic Palau  Watts M.D.   On: 10/22/2013 04:17    Medications /  Allergies: per chart  Antibiotics: Anti-infectives   Start     Dose/Rate Route Frequency Ordered Stop   10/22/13 1215  clindamycin (CLEOCIN) IVPB 600 mg     600 mg 100 mL/hr over 30 Minutes Intravenous 4 times per day 10/22/13 1136     10/22/13 1215  vancomycin (VANCOCIN) IVPB 1000 mg/200 mL premix     1,000 mg 200 mL/hr over 60 Minutes Intravenous Every 12 hours 10/22/13 1142     10/22/13 0430  ceFEPIme (MAXIPIME) 1 g in dextrose 5 % 50 mL IVPB     1 g 100 mL/hr over 30 Minutes Intravenous  Once 10/22/13 0423 10/22/13 0552   10/22/13 0430  vancomycin (VANCOCIN) IVPB 1000 mg/200 mL premix  Status:  Discontinued     1,000 mg 200 mL/hr over 60 Minutes Intravenous  Once 10/22/13 0423 10/22/13 1142   10/22/13 0430  metroNIDAZOLE (FLAGYL) IVPB 500 mg  Status:  Discontinued     500 mg 100 mL/hr over 60 Minutes Intravenous  Once 10/22/13 0423 10/22/13 0424   10/22/13 0430  [MAR Hold]  clindamycin (CLEOCIN) IVPB 900 mg     (On MAR Hold since 10/22/13 0615)   900 mg 100 mL/hr over 30 Minutes Intravenous  Once 10/22/13 0424 10/22/13 3818      Assessment/Plan Right breast necrotizing soft tissue infection SIRS I&D right breast---Dr. Redmond Pulling 10/22/13 POD#1 -continue with Percell Belt and Clinda -follow cultures -TID wet to dry dressing changes -mobilize -SCD/lovenox -CBC in AM -may need further debridement  Diabetes mellitus  -increase SSI  Erby Pian, [facility] Pager [phone] Office [phone]  10/23/2013 12:20 PM

## 2013-10-23 NOTE — Progress Notes (Signed)
Improve DM control.  Check HgbA1C.  Med consult - on no meds but 300 glc May need reoperation if not improving -  Dakin's 1/4 str

## 2013-10-23 NOTE — Progress Notes (Signed)
Spoke with patient on the phone about her diabetes. States that she was diagnosed about 7-8 years ago and started on Metformin.  Has not been taking it for about 5 years.  Has not been checking CBGs at home recently.  Does not have PCP to follow up with after discharge. Has never been on insulin.  Patient states that she does have [employer] "right now".  Recommended that she watch DM videos #501-510 while in the hospital. States that she had been to classes when she was first diagnosed. Will order a dietician consult and a DM coordinator will follow up with her tomorrow. Will order Living Well with Diabetes booklet. Will have staff RN have patient give herself an injection in case she were to be discharged on insulin. Will continue to follow while in hospital.  Harvel Ricks RN BSN CDE

## 2013-10-24 ENCOUNTER — Encounter (HOSPITAL_COMMUNITY): Admission: EM | Disposition: A | Discharge status: Home Health | Payer: Self-pay | Source: Home / Self Care

## 2013-10-24 ENCOUNTER — Inpatient Hospital Stay (HOSPITAL_COMMUNITY): Payer: BC Managed Care – PPO | Admitting: Anesthesiology

## 2013-10-24 ENCOUNTER — Encounter (HOSPITAL_COMMUNITY): Payer: Self-pay | Admitting: Anesthesiology

## 2013-10-24 ENCOUNTER — Encounter (HOSPITAL_COMMUNITY): Payer: BC Managed Care – PPO | Admitting: Anesthesiology

## 2013-10-24 DIAGNOSIS — N61 Mastitis without abscess: Secondary | ICD-10-CM

## 2013-10-24 DIAGNOSIS — L0291 Cutaneous abscess, unspecified: Secondary | ICD-10-CM

## 2013-10-24 DIAGNOSIS — D62 Acute posthemorrhagic anemia: Secondary | ICD-10-CM | POA: Diagnosis present

## 2013-10-24 DIAGNOSIS — E119 Type 2 diabetes mellitus without complications: Secondary | ICD-10-CM

## 2013-10-24 DIAGNOSIS — C50919 Malignant neoplasm of unspecified site of unspecified female breast: Secondary | ICD-10-CM

## 2013-10-24 DIAGNOSIS — L0390 Cellulitis, unspecified: Secondary | ICD-10-CM

## 2013-10-24 HISTORY — PX: IRRIGATION AND DEBRIDEMENT ABSCESS: SHX5252

## 2013-10-24 LAB — CBC
HCT: 21.6 % — ABNORMAL LOW (ref 36.0–46.0)
HEMATOCRIT: 28 % — AB (ref 36.0–46.0)
HEMATOCRIT: 22.7 % — AB (ref 36.0–46.0)
HEMOGLOBIN: 7.2 g/dL — AB (ref 12.0–15.0)
Hemoglobin: 8.8 g/dL — ABNORMAL LOW (ref 12.0–15.0)
Hemoglobin: 7.6 g/dL — ABNORMAL LOW (ref 12.0–15.0)
MCH: 26.9 pg (ref 26.0–34.0)
MCH: 28.3 pg (ref 26.0–34.0)
MCH: 28 pg (ref 26.0–34.0)
MCHC: 31.4 g/dL (ref 30.0–36.0)
MCHC: 33.3 g/dL (ref 30.0–36.0)
MCHC: 33.5 g/dL (ref 30.0–36.0)
MCV: 85.6 fL (ref 78.0–100.0)
MCV: 85 fL (ref 78.0–100.0)
MCV: 83.8 fL (ref 78.0–100.0)
PLATELETS: 332 10*3/uL (ref 150–400)
Platelets: 604 10*3/uL — ABNORMAL HIGH (ref 150–400)
Platelets: 376 10*3/uL (ref 150–400)
RBC: 3.27 MIL/uL — ABNORMAL LOW (ref 3.87–5.11)
RBC: 2.54 MIL/uL — ABNORMAL LOW (ref 3.87–5.11)
RBC: 2.71 MIL/uL — AB (ref 3.87–5.11)
RDW: 14 % (ref 11.5–15.5)
RDW: 13.9 % (ref 11.5–15.5)
RDW: 14 % (ref 11.5–15.5)
WBC: 12.1 10*3/uL — AB (ref 4.0–10.5)
WBC: 18.3 10*3/uL — ABNORMAL HIGH (ref 4.0–10.5)
WBC: 14.3 10*3/uL — AB (ref 4.0–10.5)

## 2013-10-24 LAB — PROTIME-INR
INR: 1.04 (ref 0.00–1.49)
INR: 1.13 (ref 0.00–1.49)
Prothrombin Time: 13.4 seconds (ref 11.6–15.2)
Prothrombin Time: 14.3 seconds (ref 11.6–15.2)

## 2013-10-24 LAB — HEMOGLOBIN A1C
Hgb A1c MFr Bld: 11.8 % — ABNORMAL HIGH (ref <5.7)
Mean Plasma Glucose: 292 mg/dL — ABNORMAL HIGH (ref <117)

## 2013-10-24 LAB — PREPARE RBC (CROSSMATCH)

## 2013-10-24 LAB — GLUCOSE, CAPILLARY
GLUCOSE-CAPILLARY: 172 mg/dL — AB (ref 70–99)
GLUCOSE-CAPILLARY: 252 mg/dL — AB (ref 70–99)
Glucose-Capillary: 187 mg/dL — ABNORMAL HIGH (ref 70–99)
Glucose-Capillary: 268 mg/dL — ABNORMAL HIGH (ref 70–99)
Glucose-Capillary: 192 mg/dL — ABNORMAL HIGH (ref 70–99)

## 2013-10-24 LAB — POCT I-STAT 4, (NA,K, GLUC, HGB,HCT)
GLUCOSE: 289 mg/dL — AB (ref 70–99)
HEMATOCRIT: 23 % — AB (ref 36.0–46.0)
HEMOGLOBIN: 7.8 g/dL — AB (ref 12.0–15.0)
Potassium: 3.9 mEq/L (ref 3.7–5.3)
Sodium: 137 mEq/L (ref 137–147)

## 2013-10-24 LAB — WOUND CULTURE

## 2013-10-24 LAB — SURGICAL PCR SCREEN
MRSA, PCR: POSITIVE — AB
STAPHYLOCOCCUS AUREUS: POSITIVE — AB

## 2013-10-24 LAB — ABO/RH: ABO/RH(D): O POS

## 2013-10-24 SURGERY — IRRIGATION AND DEBRIDEMENT ABSCESS
Anesthesia: General | Site: Breast | Laterality: Right

## 2013-10-24 MED ORDER — MEPERIDINE HCL 25 MG/ML IJ SOLN: 6.2500 mg | INTRAMUSCULAR | Status: DC | PRN

## 2013-10-24 MED ORDER — HYDROMORPHONE HCL PF 1 MG/ML IJ SOLN: 0.2500 mg | INTRAMUSCULAR | Status: DC | PRN

## 2013-10-24 MED ORDER — FUROSEMIDE 10 MG/ML IJ SOLN
INTRAMUSCULAR | Status: AC
  Filled 2013-10-24: qty 4

## 2013-10-24 MED ORDER — SODIUM CHLORIDE 0.9 % IV SOLN
INTRAVENOUS | Status: DC
  Administered 2013-10-24: 09:00:00 via INTRAVENOUS
  Administered 2013-10-25 – 2013-10-26 (×2): 1000 mL via INTRAVENOUS

## 2013-10-24 MED ORDER — ALBUMIN HUMAN 5 % IV SOLN
INTRAVENOUS | Status: DC | PRN
  Administered 2013-10-24 (×2): via INTRAVENOUS

## 2013-10-24 MED ORDER — FENTANYL CITRATE 0.05 MG/ML IJ SOLN
INTRAMUSCULAR | Status: AC
  Filled 2013-10-24: qty 5

## 2013-10-24 MED ORDER — OXYCODONE HCL 5 MG PO TABS: 5.0000 mg | ORAL_TABLET | Freq: Once | ORAL | Status: AC | PRN

## 2013-10-24 MED ORDER — INSULIN STARTER KIT- SYRINGES (ENGLISH)
1.0000 | Freq: Once | Status: DC
  Filled 2013-10-24: qty 1

## 2013-10-24 MED ORDER — MUPIROCIN 2 % EX OINT
1.0000 "application " | TOPICAL_OINTMENT | Freq: Two times a day (BID) | CUTANEOUS | Status: AC
  Administered 2013-10-24 – 2013-10-29 (×10): 1 via NASAL
  Filled 2013-10-24 (×2): qty 22

## 2013-10-24 MED ORDER — LIDOCAINE HCL (CARDIAC) 20 MG/ML IV SOLN
INTRAVENOUS | Status: DC | PRN
  Administered 2013-10-24: 30 mg via INTRAVENOUS

## 2013-10-24 MED ORDER — OXYCODONE HCL 5 MG/5ML PO SOLN: 5.0000 mg | Freq: Once | ORAL | Status: AC | PRN

## 2013-10-24 MED ORDER — MIDAZOLAM HCL 2 MG/2ML IJ SOLN
INTRAMUSCULAR | Status: AC
  Filled 2013-10-24: qty 2

## 2013-10-24 MED ORDER — ONDANSETRON HCL 4 MG/2ML IJ SOLN
INTRAMUSCULAR | Status: DC | PRN
  Administered 2013-10-24: 4 mg via INTRAVENOUS

## 2013-10-24 MED ORDER — PROPOFOL 10 MG/ML IV BOLUS
INTRAVENOUS | Status: AC
  Filled 2013-10-24: qty 20

## 2013-10-24 MED ORDER — INSULIN GLARGINE 100 UNIT/ML ~~LOC~~ SOLN
14.0000 [IU] | Freq: Every day | SUBCUTANEOUS | Status: DC
  Administered 2013-10-25 – 2013-11-02 (×8): 14 [IU] via SUBCUTANEOUS
  Filled 2013-10-24 (×12): qty 0.14

## 2013-10-24 MED ORDER — CHLORHEXIDINE GLUCONATE CLOTH 2 % EX PADS
6.0000 | MEDICATED_PAD | Freq: Every day | CUTANEOUS | Status: AC
  Administered 2013-10-25 – 2013-10-29 (×5): 6 via TOPICAL

## 2013-10-24 MED ORDER — LIDOCAINE HCL (CARDIAC) 20 MG/ML IV SOLN
INTRAVENOUS | Status: AC
  Filled 2013-10-24: qty 5

## 2013-10-24 MED ORDER — ONDANSETRON HCL 4 MG/2ML IJ SOLN
INTRAMUSCULAR | Status: AC
  Filled 2013-10-24: qty 2

## 2013-10-24 MED ORDER — MIDAZOLAM HCL 5 MG/5ML IJ SOLN
INTRAMUSCULAR | Status: DC | PRN
  Administered 2013-10-24 (×2): 1 mg via INTRAVENOUS
  Administered 2013-10-24: 2 mg via INTRAVENOUS

## 2013-10-24 MED ORDER — LACTATED RINGERS IV SOLN
INTRAVENOUS | Status: DC | PRN
  Administered 2013-10-24: 10:00:00 via INTRAVENOUS

## 2013-10-24 MED ORDER — PROPOFOL 10 MG/ML IV BOLUS
INTRAVENOUS | Status: DC | PRN
  Administered 2013-10-24: 120 mg via INTRAVENOUS

## 2013-10-24 MED ORDER — ENOXAPARIN SODIUM 40 MG/0.4ML ~~LOC~~ SOLN
40.0000 mg | SUBCUTANEOUS | Status: DC
  Filled 2013-10-24 (×2): qty 0.4

## 2013-10-24 MED ORDER — PHENYLEPHRINE HCL 10 MG/ML IJ SOLN
INTRAMUSCULAR | Status: AC
  Filled 2013-10-24: qty 1

## 2013-10-24 MED ORDER — FUROSEMIDE 10 MG/ML IJ SOLN
20.0000 mg | Freq: Once | INTRAMUSCULAR | Status: AC
  Administered 2013-10-24: 20 mg via INTRAVENOUS

## 2013-10-24 MED ORDER — INSULIN GLARGINE 100 UNIT/ML ~~LOC~~ SOLN: 14.0000 [IU] | Freq: Every day | SUBCUTANEOUS | Status: DC

## 2013-10-24 MED ORDER — ARTIFICIAL TEARS OP OINT
TOPICAL_OINTMENT | OPHTHALMIC | Status: DC | PRN
  Administered 2013-10-24: 1 via OPHTHALMIC

## 2013-10-24 MED ORDER — 0.9 % SODIUM CHLORIDE (POUR BTL) OPTIME
TOPICAL | Status: DC | PRN
Start: 2013-10-24 — End: 2013-10-24
  Administered 2013-10-24: 1000 mL

## 2013-10-24 MED ORDER — PHENYLEPHRINE HCL 10 MG/ML IJ SOLN
10.0000 mg | INTRAVENOUS | Status: DC | PRN
  Administered 2013-10-24: 50 ug/min via INTRAVENOUS

## 2013-10-24 MED ORDER — FENTANYL CITRATE 0.05 MG/ML IJ SOLN
INTRAMUSCULAR | Status: DC | PRN
  Administered 2013-10-24 (×2): 25 ug via INTRAVENOUS
  Administered 2013-10-24: 50 ug via INTRAVENOUS
  Administered 2013-10-24 (×2): 25 ug via INTRAVENOUS
  Administered 2013-10-24: 50 ug via INTRAVENOUS
  Administered 2013-10-24 (×2): 25 ug via INTRAVENOUS

## 2013-10-24 MED ORDER — PROMETHAZINE HCL 25 MG/ML IJ SOLN: 6.2500 mg | INTRAMUSCULAR | Status: DC | PRN

## 2013-10-24 SURGICAL SUPPLY — 56 items
BANDAGE GAUZE ELAST BULKY 4 IN (GAUZE/BANDAGES/DRESSINGS) ×2 IMPLANT
BINDER BREAST XLRG (GAUZE/BANDAGES/DRESSINGS) ×2 IMPLANT
BLADE 10 SAFETY STRL DISP (BLADE) ×2 IMPLANT
BLADE 15 SAFETY STRL DISP (BLADE) ×2 IMPLANT
BNDG GAUZE ELAST 4 BULKY (GAUZE/BANDAGES/DRESSINGS) ×4 IMPLANT
CANISTER SUCTION 2500CC (MISCELLANEOUS) ×2 IMPLANT
CHLORAPREP W/TINT 26ML (MISCELLANEOUS) IMPLANT
CLEANER TIP ELECTROSURG 2X2 (MISCELLANEOUS) ×5 IMPLANT
COVER SURGICAL LIGHT HANDLE (MISCELLANEOUS) ×3 IMPLANT
DECANTER SPIKE VIAL GLASS SM (MISCELLANEOUS) IMPLANT
DRAPE EXTREMITY T 121X128X90 (DRAPE) IMPLANT
DRAPE LAPAROSCOPIC ABDOMINAL (DRAPES) ×3 IMPLANT
DRSG PAD ABDOMINAL 8X10 ST (GAUZE/BANDAGES/DRESSINGS) ×4 IMPLANT
ELECT CAUTERY BLADE 6.4 (BLADE) ×2 IMPLANT
ELECT REM PT RETURN 9FT ADLT (ELECTROSURGICAL) ×3
ELECTRODE REM PT RTRN 9FT ADLT (ELECTROSURGICAL) ×1 IMPLANT
GAUZE SPONGE 4X4 16PLY NS LF (WOUND CARE) ×2 IMPLANT
GAUZE SPONGE 4X4 16PLY XRAY LF (GAUZE/BANDAGES/DRESSINGS) IMPLANT
GLOVE BIO SURGEON STRL SZ7 (GLOVE) ×3 IMPLANT
GLOVE BIO SURGEON STRL SZ7.5 (GLOVE) ×2 IMPLANT
GLOVE BIOGEL PI IND STRL 7.0 (GLOVE) IMPLANT
GLOVE BIOGEL PI IND STRL 7.5 (GLOVE) ×1 IMPLANT
GLOVE BIOGEL PI INDICATOR 7.0 (GLOVE) ×2
GLOVE BIOGEL PI INDICATOR 7.5 (GLOVE) ×2
GLOVE SURG SS PI 7.0 STRL IVOR (GLOVE) ×2 IMPLANT
GOWN STRL REUS W/ TWL LRG LVL3 (GOWN DISPOSABLE) ×2 IMPLANT
GOWN STRL REUS W/TWL LRG LVL3 (GOWN DISPOSABLE) ×12
KIT BASIN OR (CUSTOM PROCEDURE TRAY) ×3 IMPLANT
KIT ROOM TURNOVER OR (KITS) ×3 IMPLANT
NDL HYPO 25X1 1.5 SAFETY (NEEDLE) IMPLANT
NEEDLE HYPO 25X1 1.5 SAFETY (NEEDLE) IMPLANT
NS IRRIG 1000ML POUR BTL (IV SOLUTION) ×3 IMPLANT
PACK SURGICAL SETUP 50X90 (CUSTOM PROCEDURE TRAY) ×3 IMPLANT
PAD ABD 8X10 STRL (GAUZE/BANDAGES/DRESSINGS) ×2 IMPLANT
PAD ARMBOARD 7.5X6 YLW CONV (MISCELLANEOUS) ×6 IMPLANT
PENCIL BUTTON HOLSTER BLD 10FT (ELECTRODE) ×3 IMPLANT
SPECIMEN JAR SMALL (MISCELLANEOUS) ×3 IMPLANT
SPONGE GAUZE 4X4 12PLY (GAUZE/BANDAGES/DRESSINGS) ×3 IMPLANT
SPONGE LAP 18X18 X RAY DECT (DISPOSABLE) ×15 IMPLANT
STAPLER VISISTAT 35W (STAPLE) ×2 IMPLANT
SUT ETHILON 2 0 FS 18 (SUTURE) ×2 IMPLANT
SUT MNCRL AB 4-0 PS2 18 (SUTURE) IMPLANT
SUT VIC AB 2-0 SH 18 (SUTURE) ×4 IMPLANT
SUT VIC AB 3-0 SH 27 (SUTURE)
SUT VIC AB 3-0 SH 27XBRD (SUTURE) IMPLANT
SWAB COLLECTION DEVICE MRSA (MISCELLANEOUS) IMPLANT
SYR BULB 3OZ (MISCELLANEOUS) ×3 IMPLANT
SYR BULB IRRIGATION 50ML (SYRINGE) ×2 IMPLANT
SYR CONTROL 10ML LL (SYRINGE) IMPLANT
TOWEL OR 17X24 6PK STRL BLUE (TOWEL DISPOSABLE) ×5 IMPLANT
TOWEL OR 17X26 10 PK STRL BLUE (TOWEL DISPOSABLE) ×3 IMPLANT
TUBE ANAEROBIC SPECIMEN COL (MISCELLANEOUS) IMPLANT
TUBE CONNECTING 12'X1/4 (SUCTIONS) ×1
TUBE CONNECTING 12X1/4 (SUCTIONS) ×1 IMPLANT
WATER STERILE IRR 1000ML POUR (IV SOLUTION) IMPLANT
YANKAUER SUCT BULB TIP NO VENT (SUCTIONS) IMPLANT

## 2013-10-24 NOTE — Consult Note (Addendum)
Patient Demographics  Carmen Stone, is a 58 y.o. female   MRN: [id number]   DOB - Jul 27, 1955  Admit Date - 10/22/2013    Outpatient Primary MD for the patient is [name], MD  Consult requested in the Hospital by Nolon Nations, MD, On 10/24/2013    Reason for consult diabetes management   With History of -  Past Medical History  Diagnosis Date  . Diabetes mellitus, type II       Past Surgical History  Procedure Laterality Date  . Cesarean section    . Ventral hernia repair  2011    repair incacerated VH with biologic mesh; cholecystectomy  . Cholecystectomy  2011    in for   Chief Complaint  Patient presents with  . Breast Discharge     HPI  Carmen Stone  is a 58 y.o. female, with history of type 2 diabetes mellitus who is on Glucophage, no other known medical problems, was admitted by general surgery service for right breast drainage due to right breast abscess, she was taken to the OR on 10/24/2013 for incision and drainage, I was called to manage diabetes.  Concurrently in PACU, denies any headache fever chills, no chest abdominal pain, no palpitations or shortness of breath, some ongoing right-sided breast pain post of, no abdominal pain, no diarrhea or dysuria, no blood in stool or urine, no focal weakness.    Review of Systems    In addition to the HPI above,  No Fever-chills, No Headache, No changes with Vision or hearing, No problems swallowing food or Liquids, No Chest pain, Cough or Shortness of Breath, some right-sided breast pain postop No Abdominal pain, No Nausea or Vommitting, Bowel movements are regular, No Blood in stool or Urine, No dysuria, No new skin rashes or bruises, No new joints pains-aches,  No new weakness, tingling, numbness in any extremity, No recent weight  gain or loss, No polyuria, polydypsia or polyphagia, No significant Mental Stressors.  A full 10 point Review of Systems was done, except as stated above, all other Review of Systems were negative.   Social History History  Substance Use Topics  . Smoking status: Never Smoker   . Smokeless tobacco: Not on file  . Alcohol Use: No      Family History No history of type 2 diabetes mellitus  Prior to Admission medications   Medication Sig Start Date End Date Taking? Authorizing Provider  acetaminophen (TYLENOL) 325 MG tablet Take 650 mg by mouth every 6 (six) hours as needed for mild pain.   Yes Historical Provider, MD    Anti-infectives   Start     Dose/Rate Route Frequency Ordered Stop   10/22/13 1215  [MAR Hold]  clindamycin (CLEOCIN) IVPB 600 mg     (On MAR Hold since 10/24/13 0900)   600 mg 100 mL/hr over 30 Minutes Intravenous 4 times per day 10/22/13 1136     10/22/13 1215  [MAR Hold]  vancomycin (VANCOCIN) IVPB 1000 mg/200 mL premix     (  On MAR Hold since 10/24/13 0900)   1,000 mg 200 mL/hr over 60 Minutes Intravenous Every 12 hours 10/22/13 1142     10/22/13 0430  ceFEPIme (MAXIPIME) 1 g in dextrose 5 % 50 mL IVPB     1 g 100 mL/hr over 30 Minutes Intravenous  Once 10/22/13 0423 10/22/13 0552   10/22/13 0430  vancomycin (VANCOCIN) IVPB 1000 mg/200 mL premix  Status:  Discontinued     1,000 mg 200 mL/hr over 60 Minutes Intravenous  Once 10/22/13 0423 10/22/13 1142   10/22/13 0430  metroNIDAZOLE (FLAGYL) IVPB 500 mg  Status:  Discontinued     500 mg 100 mL/hr over 60 Minutes Intravenous  Once 10/22/13 0423 10/22/13 0424   10/22/13 0430  [MAR Hold]  clindamycin (CLEOCIN) IVPB 900 mg     (On MAR Hold since 10/22/13 0615)   900 mg 100 mL/hr over 30 Minutes Intravenous  Once 10/22/13 0424 10/22/13 0625      Scheduled Meds: . [facility] HOLD] acetaminophen  1,000 mg Oral TID  . [MAR HOLD] clindamycin (CLEOCIN) IV  600 mg Intravenous 4 times per day  . [MAR HOLD] enoxaparin  (LOVENOX) injection  40 mg Subcutaneous Q24H  . [MAR HOLD] insulin aspart  0-20 Units Subcutaneous TID WC  . [MAR HOLD] insulin aspart  0-5 Units Subcutaneous QHS  . insulin glargine  14 Units Subcutaneous Daily  . [facility] HOLD] living well with diabetes book   Does not apply Once  . [MAR HOLD] sodium chloride  1,000 mL Intravenous Once  . [MAR HOLD] sodium hypochlorite   Irrigation BID  . [facility] HOLD] vancomycin  1,000 mg Intravenous Q12H   Continuous Infusions: . sodium chloride Stopped (10/24/13 0857)   PRN Meds:.[MAR HOLD] alum & mag hydroxide-simeth, [MAR HOLD] bisacodyl, [MAR HOLD] diphenhydrAMINE, [MAR HOLD] lactated ringers, [MAR HOLD] lip balm, [MAR HOLD] magic mouthwash, [MAR HOLD] metoprolol, [MAR HOLD]  morphine injection, [MAR HOLD] oxyCODONE, [MAR HOLD] polyethylene glycol  No Known Allergies  Physical Exam  Vitals  Blood pressure 110/40, pulse 88, temperature 97.9 F (36.6 C), temperature source Oral, resp. rate 19, height 5\' 4"  (1.626 m), weight 74.532 kg (164 lb 5 oz), SpO2 100.00%.   1. General pleasant middle-aged African American female lying in bed in NAD,     2. Normal affect and insight, Not Suicidal or Homicidal, Awake Alert, Oriented X 3.  3. No F.N deficits, ALL C.Nerves Intact, Strength 5/5 all 4 extremities, Sensation intact all 4 extremities, Plantars down going.  4. Ears and Eyes appear Normal, Conjunctivae clear, PERRLA. Moist Oral Mucosa.  5. Supple Neck, No JVD, No cervical lymphadenopathy appriciated, No Carotid Bruits.  6. Symmetrical Chest wall movement, Good air movement bilaterally, CTAB.  7. RRR, No Gallops, Rubs or Murmurs, No Parasternal Heave.  8. Positive Bowel Sounds, Abdomen Soft, Non tender, No organomegaly appriciated,No rebound -guarding or rigidity.  9.  No Cyanosis, Normal Skin Turgor, No Skin Rash or Bruise.  10. Good muscle tone,  joints appear normal , no effusions, Normal ROM.  11. No Palpable Lymph Nodes in Neck or  Axillae     Data Review  CBC  Recent Labs Lab 10/22/13 0238 10/24/13 0625 10/24/13 1215 10/24/13 1219  WBC 15.9* 12.1* 18.3*  --   HGB 9.6* 8.8* 7.2* 7.8*  HCT 29.8* 28.0* 21.6* 23.0*  PLT 491* 604* 376  --   MCV 83.9 85.6 85.0  --   MCH 27.0 26.9 28.3  --   MCHC 32.2 31.4 33.3  --  RDW 13.4 14.0 13.9  --   LYMPHSABS 2.1  --   --   --   MONOABS 1.8*  --   --   --   EOSABS 0.0  --   --   --   BASOSABS 0.0  --   --   --    ------------------------------------------------------------------------------------------------------------------  Chemistries   Recent Labs Lab 10/22/13 0238 10/24/13 1219  NA 133* 137  K 4.1 3.9  CL 95*  --   CO2 23  --   GLUCOSE 306* 289*  BUN 9  --   CREATININE 0.48*  --   CALCIUM 8.8  --   AST 12  --   ALT 10  --   ALKPHOS 82  --   BILITOT 0.3  --    ------------------------------------------------------------------------------------------------------------------ estimated creatinine clearance is 74.8 ml/min (by C-G formula based on Cr of 0.48). ------------------------------------------------------------------------------------------------------------------ No results found for this basename: TSH, T4TOTAL, FREET3, T3FREE, THYROIDAB,  in the last 72 hours   Coagulation profile  Recent Labs Lab 10/24/13 1215  INR 1.04   ------------------------------------------------------------------------------------------------------------------- No results found for this basename: DDIMER,  in the last 72 hours -------------------------------------------------------------------------------------------------------------------  Cardiac Enzymes No results found for this basename: CK, CKMB, TROPONINI, MYOGLOBIN,  in the last 168 hours ------------------------------------------------------------------------------------------------------------------ No components found with this basename: POCBNP,     ---------------------------------------------------------------------------------------------------------------  Urinalysis    Component Value Date/Time   COLORURINE ORANGE* 10/22/2013 0159   APPEARANCEUR CLOUDY* 10/22/2013 0159   LABSPEC 1.043* 10/22/2013 0159   PHURINE 5.5 10/22/2013 0159   GLUCOSEU >1000* 10/22/2013 0159   HGBUR MODERATE* 10/22/2013 0159   BILIRUBINUR MODERATE* 10/22/2013 0159   KETONESUR 15* 10/22/2013 0159   PROTEINUR 100* 10/22/2013 0159   UROBILINOGEN 2.0* 10/22/2013 0159   NITRITE NEGATIVE 10/22/2013 0159   LEUKOCYTESUR SMALL* 10/22/2013 0159     Imaging results:   Dg Chest 2 View  10/22/2013   CLINICAL DATA:  Right breast pain and swelling.  Fever.  EXAM: CHEST  2 VIEW  COMPARISON:  06/13/2010  FINDINGS: The right breast is asymmetrically enlarged with extensive subcutaneous gas. This creates increased density over the right chest. There is no evidence of pneumonia in the lateral projection. No cardiomegaly. No edema, effusion, or pneumothorax.  Critical Value/emergent results were called by telephone at the time of interpretation on 10/22/2013 at 4:17 AM to Dr. Kathrynn Humble , who verbally acknowledged these results.  IMPRESSION: Right breast swelling with extensive subcutaneous gas. If no recent intervention, findings concerning for necrotizing infection.   Electronically Signed   By: Jorje Guild M.D.   On: 10/22/2013 04:17        Assessment & Plan    1.DM type II. A1c has been ordered and is pending, was on Glucophage which is on hold, I have adjusted Lantus dose, on high-dose sliding scale insulin which should be continued, will monitor CBGs and adjust insulin as needed. Will order diabetes education and insulin education for the patient.    2. Right breast abscess. Per primary team which is surgery.    3. Intraoperative blood loss. Reported blood loss from 1500 cc, will advise to keep her Hb around 7.5, getting 4 units of transfusions in PACU. Monitor posttransfusion  pulse ox. We'll give 1 dose of Lasix after 3 units.      DVT Prophylaxis per Primary Team  AM Labs Ordered, also please review Full Orders  Family Communication: Plan discussed with patient     Thank you for the consult, we will  follow the patient with you in the Hospital.   Thurnell Lose M.D on 10/24/2013 at 12:51 PM  Between 7am to 7pm - Pager - [phone]  After 7pm go to [email or web address] - password [email or web address]  And look for the night coverage person covering me after hours   Thank you for the consult, we will follow the patient with you in the [place]  [phone]

## 2013-10-24 NOTE — Op Note (Signed)
Carmen Stone, Carmen Stone           ACCOUNT NO.:  [id number]  MEDICAL RECORD NO.:  [id number]  LOCATION:  [id number]                        FACILITY:  [facility]  PHYSICIAN:  Leighton Ruff. Redmond Pulling, MD, FACSDATE OF BIRTH:  Feb 01, 1956  DATE OF PROCEDURE:  10/22/2013 DATE OF DISCHARGE:                              OPERATIVE REPORT   PREOPERATIVE DIAGNOSIS:  Necrotizing soft tissue infection of right breast.  POSTOPERATIVE DIAGNOSIS:  Necrotizing soft tissue infection of right breast, possible breast cancer.  PROCEDURE:  Irrigation and drainage and debridement of right breast wound (scalpel, 5 x 3 cm)& right breast biopsy.  SURGEON:  Leighton Ruff. Redmond Pulling, MD, FACS  ANESTHESIA:  General.  ESTIMATED BLOOD LOSS:  Less than 50.  SPECIMEN:  Right breast, skin, and subcutaneous tissue.  Aerobic and anaerobic cultures.  INDICATIONS FOR PROCEDURE:  The patient is a 58 year old African American female who states that she was in her usual state of health until this past Monday when she developed right breast swelling and pain.  She also states that she started to have some drainage along the lower fold of her right breast.  She came to the emergency room for worsening pain.  [name], she had chest x-ray, which demonstrated extensive subcutaneous gas in her right breast.  On physical exam, she had a classic orange peel appearance to her right breast which was grossly enlarged and atypical in size compared to the contralateral breast.  There was a very strong odor.  She had an open wound in her lower inner quadrant along the inframammary fold, however, approximately 3 cm x 2 cm.  I could actually expel air from the right breast if I pressed on it.  Based on this, I recommend taking the patient to the operating room to further open up the wound irrigated and debrided and take a skin biopsy for concerns for possible inflammatory breast cancer.  We discussed the risks and benefits of the  procedure including, but not limited to, bleeding, infection, injury to surrounding structures, potential nipple loss, failure to diagnose, need for additional procedures, chronic wound-unhealing wound, blood clot formation, anesthesia risks, as well as typical recovery course.  She elected to proceed to surgery.  DESCRIPTION OF PROCEDURE:  The right side was marked with the patient confirming the operative site.  She was taken to the operating room 1 of [facility] urgently.  General LMA anesthesia was established.  Her right breast and chest were prepped and draped in the usual standard surgical fashion with ChloraPrep.  She was already receiving broad-spectrum IV antibiotics.  A surgical time-out was performed.  Taking a scalpel, I enlarged the open wound along the inner lower quadrant of her breast along the inframammary fold.  The superior skin edge was elliptically excised sharply with a #15 blade.  I took approximately 5 cm long piece by 3 cm wide piece.  There was a large amount of blood oozing from the skin edges.  In palpating the breast, it drained grayish reddish fluid almost dishwasher fluid.  I placed my finger into the large cavity and essentially was able to reach up and behind her nipple and was able to sweep from the 3 o'clock  position to the 9 o'clock position.  I did not really feel any loculated tissue. There was 1 large firm piece of breast tissue in the posterior aspect of her breast.  There were several chunks of breast tissue or necrotic breast tissue that fell out that was also sent to Pathology.  Aerobic and anaerobic cultures were obtained.  Hemostasis was achieved.  After that, I had adequately opened up the wound and the cavity.  One entire roll of Kerlix was placed into the right breast cavity followed by fluffs and a elastic Ace wrap.  She was extubated and brought to the recovery room in stable condition.  All needle, instrument, and  sponge counts were correct x2.  There were no immediate complications.  The patient tolerated the procedure well.     Leighton Ruff. Redmond Pulling, MD, FACS     EMW/MEDQ  D:  10/23/2013  T:  10/24/2013  Job:  [id number]

## 2013-10-24 NOTE — Anesthesia Postprocedure Evaluation (Signed)
  Anesthesia Post-op Note  Patient: Carmen Stone  Procedure(s) Performed: Procedure(s): Right total mastectomy (Right)  Patient Location: PACU  Anesthesia Type:General  Level of Consciousness: awake, alert , oriented and patient cooperative  Airway and Oxygen Therapy: Patient Spontanous Breathing and Patient connected to nasal cannula oxygen  Post-op Pain: mild  Post-op Assessment: Post-op Vital signs reviewed, Patient's Cardiovascular Status Stable, Respiratory Function Stable, Patent Airway, No signs of Nausea or vomiting and Pain level controlled, continuing transfusion  Post-op Vital Signs: Reviewed and stable  Last Vitals:  Filed Vitals:   10/24/13 1314  BP: 115/50  Pulse: 91  Temp: 36.7 C  Resp: 15    Complications: No apparent anesthesia complications

## 2013-10-24 NOTE — Progress Notes (Signed)
Agree with above 

## 2013-10-24 NOTE — Progress Notes (Signed)
Inpatient Diabetes Program Recommendations  AACE/ADA: New Consensus Statement on Inpatient Glycemic Control (2013)  Target Ranges:  Prepandial:   less than 140 mg/dL      Peak postprandial:   less than 180 mg/dL (1-2 hours)      Critically ill patients:  140 - 180 mg/dL   Consult received for "insulin teaching".  Pt is currently in the PACU following I&D.  Diabetes Coordinator will follow-up with patient once she has recovered from surgery.   Basic inpatient education was ordered for patient over the weekend.  Bedside RN's should be continuing to teach insulin administration and basic diabetes management.  Will follow. Thank you  Raoul Pitch BSN, RN,CDE Inpatient Diabetes Coordinator [phone] (team pager)

## 2013-10-24 NOTE — Op Note (Signed)
Preoperative diagnosis: Necrotizing soft tissue infection right breast Postoperative diagnosis: Same as above Procedure: Right total mastectomy Surgeon: Dr. Serita Grammes Anesthesia: Gen. With LMA Estimated blood loss: 1300 cc Drains: None Specimens: Right breast and overlying skin with the nipple areolar complex to pathology Complications: None Fluids: 2 units packed red blood cells and 2 units of FFP given Sponge count correct at completion Disposition to ICU in stable condition  Indications: This is a 58 year old female with diabetes who presented and one of my partners to the operating room early on Saturday morning. She has continued to have a very foul smelling wound and this looked necrotic I examined it today. We discussed going back to the operating room to completely examine his wound as I was concerned that there was more necrotic tissue.  Procedure: After informed consent was obtained the patient was taken to the operating room. She was on antibiotics on the floor. Sequential compression devices were on her legs. She was placed under general anesthesia without complication. Her right breast was prepped and draped in the standard sterile surgical fashion. Surgical timeout was then performed.  I explored her prior incision. I put my hand in and necrotic breast tissue came out. I then extended the  incision inferiorly and realized that really the entire breast was necrotic. This was very foul smelling and all appeared dead. Eventually I realized that I needed to do a right total mastectomy due to the fact that the entire breast as well as some of the skin was necrotic. I was concerned that if I did not do that she would have a progressive necrotizing soft tissue infection I thought she needed this for her general health. Eventually I did a larger elliptical incision and performed was essentially a right total mastectomy back to healthy bleeding tissue. Once I had done this I spent a fair  amount of time obtaining hemostasis. There was a lot of blood loss during this procedure. She did receive blood products in the operating room. I then placed Kerlix gauze in this. I then closed some of the lateral portion with nylon sutures. A dressing was in place. A breast binder was placed. She tolerated this and will be transferred to the ICU.

## 2013-10-24 NOTE — Progress Notes (Signed)
Day of Surgery  Subjective: WBC down, afebrile.    Objective: Vital signs in last 24 hours: Temp:  [98.3 F (36.8 C)-99.3 F (37.4 C)] 99 F (37.2 C) (05/11 0848) Pulse Rate:  [86-107] 98 (05/11 0848) Resp:  [16-18] 18 (05/11 0848) BP: (98-142)/(48-73) 129/55 mmHg (05/11 0848) SpO2:  [98 %-100 %] 99 % (05/11 0848) Last BM Date: 10/22/13  Intake/Output from previous day: 05/10 0701 - 05/11 0700 In: 840 [P.O.:840] Out: 1 [Urine:1] Intake/Output this shift:   Physical Exam:  General: Pt awake/alert/oriented x4 in no acute distress  Skin: right breast moderate amount of edema, erythema, wound is foul smelling, areas of necrosis    Lab Results:   Recent Labs  10/22/13 0238 10/24/13 0625  WBC 15.9* 12.1*  HGB 9.6* 8.8*  HCT 29.8* 28.0*  PLT 491* 604*   BMET  Recent Labs  10/22/13 0238  NA 133*  K 4.1  CL 95*  CO2 23  GLUCOSE 306*  BUN 9  CREATININE 0.48*  CALCIUM 8.8   PT/INR No results found for this basename: LABPROT, INR,  in the last 72 hours ABG No results found for this basename: PHART, PCO2, PO2, HCO3,  in the last 72 hours  Studies/Results: No results found.  Anti-infectives: Anti-infectives   Start     Dose/Rate Route Frequency Ordered Stop   10/22/13 1215  clindamycin (CLEOCIN) IVPB 600 mg     600 mg 100 mL/hr over 30 Minutes Intravenous 4 times per day 10/22/13 1136     10/22/13 1215  vancomycin (VANCOCIN) IVPB 1000 mg/200 mL premix     1,000 mg 200 mL/hr over 60 Minutes Intravenous Every 12 hours 10/22/13 1142     10/22/13 0430  ceFEPIme (MAXIPIME) 1 g in dextrose 5 % 50 mL IVPB     1 g 100 mL/hr over 30 Minutes Intravenous  Once 10/22/13 0423 10/22/13 0552   10/22/13 0430  vancomycin (VANCOCIN) IVPB 1000 mg/200 mL premix  Status:  Discontinued     1,000 mg 200 mL/hr over 60 Minutes Intravenous  Once 10/22/13 0423 10/22/13 1142   10/22/13 0430  metroNIDAZOLE (FLAGYL) IVPB 500 mg  Status:  Discontinued     500 mg 100 mL/hr over 60  Minutes Intravenous  Once 10/22/13 0423 10/22/13 0424   10/22/13 0430  [MAR Hold]  clindamycin (CLEOCIN) IVPB 900 mg     (On MAR Hold since 10/22/13 0615)   900 mg 100 mL/hr over 30 Minutes Intravenous  Once 10/22/13 0424 10/22/13 3818     Assessment/Plan  Right breast necrotizing soft tissue infection  SIRS  I&D right breast---Dr. Redmond Pulling 10/22/13  POD#2  -OR this AM for incision and further debridement.  She has been NPO since midnight.  lovenox given last 5/10 -continue with Vanz and Clinda  -Cx with GPR, GPC, few GNR -mobilize  -SCD/lovenox  Diabetes mellitus  -CBGs, SSI, lantus -A1C is pending  -may need IM consult for management.    LOS: 2 days    Carmen Stone ANP-BC 10/24/2013 8:58 AM

## 2013-10-24 NOTE — Progress Notes (Signed)
The patient is awake, alert and oriented.  VSS.  She complains of pain.  RN at bedside, advised to give pain meds. She is receiving her last unit of blood.  She has received 2 units of PRBCs and 2 units of FFP in the OR.   We will check a CBC post transfusion and monitor closely overnight.  Orlandus Borowski, ANP-BC

## 2013-10-24 NOTE — Transfer of Care (Signed)
Immediate Anesthesia Transfer of Care Note  Patient: Carmen Stone  Procedure(s) Performed: Procedure(s): Right total mastectomy (Right)  Patient Location: PACU  Anesthesia Type:General  Level of Consciousness: awake, alert  and oriented  Airway & Oxygen Therapy: Patient Spontanous Breathing and Patient connected to nasal cannula oxygen  Post-op Assessment: Report given to PACU RN and Post -op Vital signs reviewed and stable  Post vital signs: Reviewed and stable  Complications: No apparent anesthesia complications

## 2013-10-24 NOTE — Anesthesia Preprocedure Evaluation (Addendum)
Anesthesia Evaluation  Patient identified by MRN, date of birth, ID band Patient awake    Reviewed: Allergy & Precautions, H&P , NPO status , Patient's Chart, lab work & pertinent test results  History of Anesthesia Complications Negative for: history of anesthetic complications  Airway Mallampati: II TM Distance: >3 FB Neck ROM: Full    Dental  (+) Poor Dentition, Dental Advisory Given, Edentulous Upper, Loose, Missing, Chipped   Pulmonary COPDformer smoker (quit 4 years),  breath sounds clear to auscultation  Pulmonary exam normal       Cardiovascular - anginanegative cardio ROS  Rhythm:Regular Rate:Normal     Neuro/Psych negative neurological ROS     GI/Hepatic negative GI ROS, Neg liver ROS,   Endo/Other  diabetes (glu 172), Type 2, Insulin Dependent  Renal/GU negative Renal ROS     Musculoskeletal   Abdominal   Peds  Hematology  (+) Blood dyscrasia (Hb 8.8), anemia ,   Anesthesia Other Findings   Reproductive/Obstetrics                         Anesthesia Physical Anesthesia Plan  ASA: III  Anesthesia Plan: General   Post-op Pain Management:    Induction: Intravenous  Airway Management Planned: LMA  Additional Equipment:   Intra-op Plan:   Post-operative Plan:   Informed Consent: I have reviewed the patients History and Physical, chart, labs and discussed the procedure including the risks, benefits and alternatives for the proposed anesthesia with the patient or authorized representative who has indicated his/her understanding and acceptance.   Dental advisory given  Plan Discussed with: Anesthesiologist, Surgeon and CRNA  Anesthesia Plan Comments: (Plan routine monitors, GA- LMA OK)       Anesthesia Quick Evaluation                                  Anesthesia Evaluation  Patient identified by MRN, date of birth, ID band Patient awake    Reviewed: Allergy &  Precautions, H&P , NPO status , Patient's Chart, lab work & pertinent test results  Airway Mallampati: I TM Distance: >3 FB Neck ROM: Full    Dental  (+) Poor Dentition, Edentulous Upper, Edentulous Lower, Dental Advisory Given   Pulmonary  breath sounds clear to auscultation        Cardiovascular Rhythm:Regular Rate:Normal     Neuro/Psych    GI/Hepatic   Endo/Other  diabetes, Poorly Controlled, Type 2  Renal/GU      Musculoskeletal   Abdominal   Peds  Hematology   Anesthesia Other Findings   Reproductive/Obstetrics                           Anesthesia Physical Anesthesia Plan  ASA: III  Anesthesia Plan: General   Post-op Pain Management:    Induction: Intravenous  Airway Management Planned: LMA  Additional Equipment:   Intra-op Plan:   Post-operative Plan: Extubation in OR  Informed Consent: I have reviewed the patients History and Physical, chart, labs and discussed the procedure including the risks, benefits and alternatives for the proposed anesthesia with the patient or authorized representative who has indicated his/her understanding and acceptance.   Dental advisory given  Plan Discussed with: CRNA, Anesthesiologist and Surgeon  Anesthesia Plan Comments:         Anesthesia Quick Evaluation

## 2013-10-24 NOTE — Anesthesia Procedure Notes (Signed)
Procedure Name: LMA Insertion Date/Time: 10/24/2013 9:56 AM Performed by: Erik Obey Pre-anesthesia Checklist: Patient identified, Patient being monitored, Emergency Drugs available, Timeout performed and Suction available Patient Re-evaluated:Patient Re-evaluated prior to inductionOxygen Delivery Method: Circle system utilized Preoxygenation: Pre-oxygenation with 100% oxygen Intubation Type: IV induction LMA: LMA inserted LMA Size: 4.0 Number of attempts: 1 Placement Confirmation: positive ETCO2 and breath sounds checked- equal and bilateral Tube secured with: Tape Dental Injury: Teeth and Oropharynx as per pre-operative assessment

## 2013-10-25 ENCOUNTER — Encounter (HOSPITAL_COMMUNITY): Payer: Self-pay | Admitting: Radiology

## 2013-10-25 ENCOUNTER — Inpatient Hospital Stay (HOSPITAL_COMMUNITY): Payer: BC Managed Care – PPO

## 2013-10-25 DIAGNOSIS — J9600 Acute respiratory failure, unspecified whether with hypoxia or hypercapnia: Secondary | ICD-10-CM

## 2013-10-25 DIAGNOSIS — R8290 Unspecified abnormal findings in urine: Secondary | ICD-10-CM | POA: Diagnosis present

## 2013-10-25 DIAGNOSIS — J9601 Acute respiratory failure with hypoxia: Secondary | ICD-10-CM | POA: Diagnosis not present

## 2013-10-25 DIAGNOSIS — R82998 Other abnormal findings in urine: Secondary | ICD-10-CM

## 2013-10-25 DIAGNOSIS — D62 Acute posthemorrhagic anemia: Secondary | ICD-10-CM

## 2013-10-25 LAB — WOUND CULTURE

## 2013-10-25 LAB — BASIC METABOLIC PANEL
BUN: 6 mg/dL (ref 6–23)
CO2: 25 meq/L (ref 19–32)
CREATININE: 0.55 mg/dL (ref 0.50–1.10)
Calcium: 7.3 mg/dL — ABNORMAL LOW (ref 8.4–10.5)
Chloride: 103 mEq/L (ref 96–112)
GFR calc Af Amer: >90 mL/min (ref >90)
GFR calc non Af Amer: >90 mL/min (ref >90)
GLUCOSE: 171 mg/dL — AB (ref 70–99)
Potassium: 3.6 mEq/L — ABNORMAL LOW (ref 3.7–5.3)
Sodium: 137 mEq/L (ref 137–147)

## 2013-10-25 LAB — CBC
HCT: 22.8 % — ABNORMAL LOW (ref 36.0–46.0)
HEMATOCRIT: 24 % — AB (ref 36.0–46.0)
HEMOGLOBIN: 8 g/dL — AB (ref 12.0–15.0)
Hemoglobin: 7.6 g/dL — ABNORMAL LOW (ref 12.0–15.0)
MCH: 27.1 pg (ref 26.0–34.0)
MCH: 28 pg (ref 26.0–34.0)
MCHC: 33.3 g/dL (ref 30.0–36.0)
MCHC: 33.3 g/dL (ref 30.0–36.0)
MCV: 81.4 fL (ref 78.0–100.0)
MCV: 83.9 fL (ref 78.0–100.0)
PLATELETS: 395 10*3/uL (ref 150–400)
Platelets: 390 10*3/uL (ref 150–400)
RBC: 2.8 MIL/uL — AB (ref 3.87–5.11)
RBC: 2.86 MIL/uL — ABNORMAL LOW (ref 3.87–5.11)
RDW: 14.2 % (ref 11.5–15.5)
RDW: 14.5 % (ref 11.5–15.5)
WBC: 9.9 10*3/uL (ref 4.0–10.5)
WBC: 9.1 10*3/uL (ref 4.0–10.5)

## 2013-10-25 LAB — PREPARE FRESH FROZEN PLASMA
UNIT DIVISION: 0
Unit division: 0

## 2013-10-25 LAB — GLUCOSE, CAPILLARY
GLUCOSE-CAPILLARY: 186 mg/dL — AB (ref 70–99)
Glucose-Capillary: 247 mg/dL — ABNORMAL HIGH (ref 70–99)
Glucose-Capillary: 125 mg/dL — ABNORMAL HIGH (ref 70–99)

## 2013-10-25 LAB — BLOOD PRODUCT ORDER (VERBAL) VERIFICATION

## 2013-10-25 LAB — PREPARE RBC (CROSSMATCH)

## 2013-10-25 MED ORDER — ACETAMINOPHEN 325 MG PO TABS
650.0000 mg | ORAL_TABLET | ORAL | Status: DC | PRN
  Administered 2013-10-25 – 2013-10-28 (×2): 650 mg via ORAL
  Filled 2013-10-25 (×2): qty 2

## 2013-10-25 MED ORDER — IOHEXOL 300 MG/ML  SOLN
100.0000 mL | Freq: Once | INTRAMUSCULAR | Status: AC | PRN
Start: 2013-10-25 — End: 2013-10-25
  Administered 2013-10-25: 100 mL via INTRAVENOUS

## 2013-10-25 NOTE — Progress Notes (Signed)
ANTIBIOTIC CONSULT NOTE   Pharmacy Consult for vancomycin Indication: breast abscess  No Known Allergies  Patient Measurements: Height: 5\' 4"  (162.6 cm) Weight: 164 lb 5 oz (74.532 kg) IBW/kg (Calculated) : 54.7   Vital Signs: Temp: 99.5 F (37.5 C) (05/12 1115) Temp src: Oral (05/12 1115) BP: 110/48 mmHg (05/12 1500) Pulse Rate: 107 (05/12 1400) Intake/Output from previous day: 05/11 0701 - 05/12 0700 In: 5383.5 [P.O.:600; I.V.:2030; Blood:1953.5; IV Piggyback:800] Out: 3625 [Urine:2125; Blood:1500] Intake/Output from this shift: Total I/O In: 650 [I.V.:400; IV Piggyback:250] Out: 1225 [Urine:1225]  Labs:  Recent Labs  10/24/13 1520 10/25/13 0237 10/25/13 0905  WBC 14.3* 9.9 9.1  HGB 7.6* 7.6* 8.0*  PLT 332 395 390  CREATININE  --  0.55  --    Estimated Creatinine Clearance: 74.8 ml/min (by C-G formula based on Cr of 0.55). No results found for this basename: VANCOTROUGH, VANCOPEAK, VANCORANDOM, GENTTROUGH, GENTPEAK, GENTRANDOM, TOBRATROUGH, TOBRAPEAK, TOBRARND, AMIKACINPEAK, AMIKACINTROU, AMIKACIN,  in the last 72 hours   Microbiology: Recent Results (from the past 720 hour(s))  CULTURE, BLOOD (ROUTINE X 2)     Status: None   Collection Time    10/22/13  3:03 AM      Result Value Ref Range Status   Specimen Description BLOOD LEFT ANTECUBITAL   Final   Special Requests BOTTLES DRAWN AEROBIC AND ANAEROBIC 10CC EACH   Final   Culture  Setup Time     Final   Value: 10/22/2013 12:09     Performed at [facility]   Culture     Final   Value:        BLOOD CULTURE RECEIVED NO GROWTH TO DATE CULTURE WILL BE HELD FOR 5 DAYS BEFORE ISSUING A FINAL NEGATIVE REPORT     Performed at [facility]   Report Status PENDING   Incomplete  CULTURE, BLOOD (ROUTINE X 2)     Status: None   Collection Time    10/22/13  3:08 AM      Result Value Ref Range Status   Specimen Description BLOOD RIGHT ANTECUBITAL   Final   Special Requests BOTTLES DRAWN AEROBIC ONLY  10CC   Final   Culture  Setup Time     Final   Value: 10/22/2013 12:09     Performed at [facility]   Culture     Final   Value:        BLOOD CULTURE RECEIVED NO GROWTH TO DATE CULTURE WILL BE HELD FOR 5 DAYS BEFORE ISSUING A FINAL NEGATIVE REPORT     Performed at [facility]   Report Status PENDING   Incomplete  WOUND CULTURE     Status: None   Collection Time    10/22/13  4:35 AM      Result Value Ref Range Status   Specimen Description WOUND   Final   Special Requests BREAST   Final   Gram Stain     Final   Value: MODERATE WBC PRESENT, PREDOMINANTLY PMN     RARE SQUAMOUS EPITHELIAL CELLS PRESENT     FEW GRAM NEGATIVE RODS     FEW GRAM POSITIVE RODS     MODERATE GRAM POSITIVE COCCI IN PAIRS   Culture     Final   Value: MODERATE GROUP B STREP(S.AGALACTIAE)ISOLATED     Note: TESTING AGAINST S. AGALACTIAE NOT ROUTINELY PERFORMED DUE TO PREDICTABILITY OF AMP/PEN/VAN SUSCEPTIBILITY.     Performed at [facility]   Report Status 10/24/2013 FINAL  Final  ANAEROBIC CULTURE     Status: None   Collection Time    10/22/13  6:46 AM      Result Value Ref Range Status   Specimen Description WOUND BREAST RIGHT   Final   Special Requests NONE   Final   Gram Stain     Final   Value: ABUNDANT WBC PRESENT, PREDOMINANTLY PMN     FEW SQUAMOUS EPITHELIAL CELLS PRESENT     ABUNDANT GRAM POSITIVE COCCI IN PAIRS     IN CLUSTERS IN CHAINS FEW GRAM NEGATIVE COCCI     MODERATE GRAM NEGATIVE RODS   Culture     Final   Value: NO ANAEROBES ISOLATED; CULTURE IN PROGRESS FOR 5 DAYS     Performed at [facility]   Report Status PENDING   Incomplete  WOUND CULTURE     Status: None   Collection Time    10/22/13  6:46 AM      Result Value Ref Range Status   Specimen Description WOUND BREAST RIGHT   Final   Special Requests NONE   Final   Gram Stain     Final   Value: MODERATE WBC PRESENT, PREDOMINANTLY PMN     RARE SQUAMOUS EPITHELIAL CELLS PRESENT     ABUNDANT GRAM  POSITIVE COCCI IN PAIRS     IN CHAINS FEW GRAM POSITIVE RODS     FEW GRAM NEGATIVE RODS   Culture     Final   Value: MODERATE GROUP B STREP(S.AGALACTIAE)ISOLATED     Note: TESTING AGAINST S. AGALACTIAE NOT ROUTINELY PERFORMED DUE TO PREDICTABILITY OF AMP/PEN/VAN SUSCEPTIBILITY.     Performed at [facility]   Report Status 10/25/2013 FINAL   Final  SURGICAL PCR SCREEN     Status: Abnormal   Collection Time    10/24/13  8:51 AM      Result Value Ref Range Status   MRSA, PCR POSITIVE (*) NEGATIVE Final   Staphylococcus aureus POSITIVE (*) NEGATIVE Final   Comment:            The Xpert SA Assay (FDA     approved for NASAL specimens     in patients over 71 years of age),     is one component of     a comprehensive surveillance     program.  Test performance has     been validated by [employer] for patients greater     than or equal to 66 year old.     It is not intended     to diagnose infection nor to     guide or monitor treatment.    Medical History: Past Medical History  Diagnosis Date  . Diabetes mellitus, type II     Medications:  Prescriptions prior to admission  Medication Sig Dispense Refill  . acetaminophen (TYLENOL) 325 MG tablet Take 650 mg by mouth every 6 (six) hours as needed for mild pain.       Assessment: 58 year old woman s/p debridement of breast abscess now s/p mastectomy and possibly back to OR tomorrow. Pathology shows high grade cancer. Tmax 100.2 overnight with normal wbc count. Renal function is normal with no dose adjustments needed in antibiotics. Goal of Therapy:  Vancomycin trough level 10-15 mcg/ml  Plan:  Measure antibiotic drug levels at steady state Follow up culture results Vancomycin 1g IV q12 - check trough in am Monitor renal function  Georgina Peer 10/25/2013,4:06 PM

## 2013-10-25 NOTE — Clinical Documentation Improvement (Signed)
Possible Clinical Conditions?   ____UTI ____Other Condition ____Cannot Clinically Determine     Diagnostics: 5/09:  Abnormal urinalysis  5/09:  Abnormal urine micro   Thank You, Theron Arista, Clinical Documentation Specialist:  [facility]

## 2013-10-25 NOTE — Plan of Care (Signed)
Labs and VS reviewed. Baseline Hgb 9.6 and now down to 7.6 post op. SBP low in the 90s c/w symptomatic anemia. Will transfuse 2 units PRBCs and hold maintenance fluids during transfusion  Erin Hearing, ANP  Have examined Pt w/ ANP Ebony Hail discussed A/P and agree with plan. Discussed plan w/ Pt and answered all questions.  Greater then 40 min were spent in direct care of this Pt with MMP.

## 2013-10-25 NOTE — Progress Notes (Signed)
[name]  Carmen Stone MOQ:[id number] DOB: Dec 22, 1955 DOA: 10/22/2013 PCP: [name], MD  Time spent :  Brief narrative: 58 y.o. female, with history of type 2 diabetes mellitus who is on Glucophage, no other known medical problems, was admitted by general surgery service for right breast drainage due to right breast abscess, she was taken to the OR on 10/24/2013 for incision and drainage, [facility] was consulted to manage diabetes.  HPI/Subjective: Denies significant pain in right breast- able to move right arm and feed self. Denies SOB or CP  Assessment/Plan: Active Problems:   DIABETES MELLITUS, TYPE II -moderate control in setting of necrotizing infection -cont current Lantus and SSI -poor control pre admit: HgbA1c 11.8 -may benefit from diabetes education    Acute respiratory failure with hypoxia -CXR unremarkable -wean and dc oxygen    Postoperative anemia due to acute blood loss -baseline hgb ~9.6 -now 7.61 8  -transfuse 2 units since SBP 90s and c/w sx's   Abnormal urinalysis -appears c/w UTI -unfortunately urine cx NOT obtained prior to initiation of anbxs so will obtain now    Breast abscess of female -per [facility] -add'l procedure planned for 5/13   DVT prophylaxis: Lovenox Code Status: Full Family Communication: No family at bedside Disposition Plan/Expected LOS: ICU and at discretion of primary team   Consultants: [facility]  Procedures: Right total mastectomy-5/11  Antibiotics: Cefepime x 1 dose Clindamycin 5/8 >>> Vancomycin 5/9 >>>  Objective: Blood pressure 112/49, pulse 96, temperature 98.7 F (37.1 C), temperature source Oral, resp. rate 24, height _0  (1.626 m), weight 164 lb 5 oz (74.532 kg), SpO2 97.00%.  Intake/Output Summary (Last 24 hours) at 10/25/13 1053 Last data filed at 10/25/13 0900  Gross per 24 hour  Intake 4998.5 ml  Output   2825 ml  Net 2173.5 ml     Exam: General: No  acute respiratory distress Breast: right mastectomy site drsg CDI- no RUE swelling Lungs: Clear to auscultation bilaterally without wheezes or crackles, 2L Cardiovascular: Regular rate and rhythm without murmur gallop or rub normal S1 and S2, no peripheral edema or JVD Abdomen: Nontender, nondistended, soft, bowel sounds positive, no rebound, no ascites, no appreciable mass Musculoskeletal: No significant cyanosis, clubbing of bilateral lower extremities Neurological: Alert and oriented x 3, moves all extremities x 4 without focal neurological deficits, CN 2-12 intact  Scheduled Meds:  Scheduled Meds: . Chlorhexidine Gluconate Cloth  6 each Topical Q0600  . clindamycin (CLEOCIN) IV  600 mg Intravenous 4 times per day  . enoxaparin (LOVENOX) injection  40 mg Subcutaneous Q24H  . insulin aspart  0-20 Units Subcutaneous TID WC  . insulin aspart  0-5 Units Subcutaneous QHS  . insulin glargine  14 Units Subcutaneous Daily  . insulin starter kit- syringes  1 kit Other Once  . mupirocin ointment  1 application Nasal BID  . vancomycin  1,000 mg Intravenous Q12H   Continuous Infusions: . sodium chloride 100 mL/hr at 10/25/13 0700    Data Reviewed: Basic Metabolic Panel:  Recent Labs Lab 10/22/13 0238 10/24/13 1219 10/25/13 0237  NA 133* 137 137  K 4.1 3.9 3.6*  CL 95*  --  103  CO2 23  --  25  GLUCOSE 306* 289* 171*  BUN 9  --  6  CREATININE 0.48*  --  0.55  CALCIUM 8.8  --  7.3*   Liver Function Tests:  Recent Labs Lab 10/22/13 0238  AST 12  ALT 10  ALKPHOS 82  BILITOT 0.3  PROT 7.1  ALBUMIN 2.4*   No results found for this basename: LIPASE, AMYLASE,  in the last 168 hours No results found for this basename: AMMONIA,  in the last 168 hours CBC:  Recent Labs Lab 10/22/13 0238 10/24/13 0625 10/24/13 1215 10/24/13 1219 10/24/13 1520 10/25/13 0237 10/25/13 0905  WBC 15.9* 12.1* 18.3*  --  14.3* 9.9 9.1  NEUTROABS 12.0*  --   --   --   --   --   --   HGB 9.6*  8.8* 7.2* 7.8* 7.6* 7.6* 8.0*  HCT 29.8* 28.0* 21.6* 23.0* 22.7* 22.8* 24.0*  MCV 83.9 85.6 85.0  --  83.8 81.4 83.9  PLT 491* 604* 376  --  332 395 390   Cardiac Enzymes: No results found for this basename: CKTOTAL, CKMB, CKMBINDEX, TROPONINI,  in the last 168 hours BNP (last 3 results) No results found for this basename: PROBNP,  in the last 8760 hours CBG:  Recent Labs Lab 10/24/13 0938 10/24/13 1146 10/24/13 1626 10/24/13 2121 10/25/13 0748  GLUCAP 187* 268* 252* 192* 186*    Recent Results (from the past 240 hour(s))  CULTURE, BLOOD (ROUTINE X 2)     Status: None   Collection Time    10/22/13  3:03 AM      Result Value Ref Range Status   Specimen Description BLOOD LEFT ANTECUBITAL   Final   Special Requests BOTTLES DRAWN AEROBIC AND ANAEROBIC 10CC EACH   Final   Culture  Setup Time     Final   Value: 10/22/2013 12:09     Performed at [facility]   Culture     Final   Value:        BLOOD CULTURE RECEIVED NO GROWTH TO DATE CULTURE WILL BE HELD FOR 5 DAYS BEFORE ISSUING A FINAL NEGATIVE REPORT     Performed at [facility]   Report Status PENDING   Incomplete  CULTURE, BLOOD (ROUTINE X 2)     Status: None   Collection Time    10/22/13  3:08 AM      Result Value Ref Range Status   Specimen Description BLOOD RIGHT ANTECUBITAL   Final   Special Requests BOTTLES DRAWN AEROBIC ONLY 10CC   Final   Culture  Setup Time     Final   Value: 10/22/2013 12:09     Performed at [facility]   Culture     Final   Value:        BLOOD CULTURE RECEIVED NO GROWTH TO DATE CULTURE WILL BE HELD FOR 5 DAYS BEFORE ISSUING A FINAL NEGATIVE REPORT     Performed at [facility]   Report Status PENDING   Incomplete  WOUND CULTURE     Status: None   Collection Time    10/22/13  4:35 AM      Result Value Ref Range Status   Specimen Description WOUND   Final   Special Requests BREAST   Final   Gram Stain     Final   Value: MODERATE WBC PRESENT, PREDOMINANTLY  PMN     RARE SQUAMOUS EPITHELIAL CELLS PRESENT     FEW GRAM NEGATIVE RODS     FEW GRAM POSITIVE RODS     MODERATE GRAM POSITIVE COCCI IN PAIRS   Culture     Final   Value: MODERATE GROUP B STREP(S.AGALACTIAE)ISOLATED     Note: TESTING AGAINST S. AGALACTIAE NOT  ROUTINELY PERFORMED DUE TO PREDICTABILITY OF AMP/PEN/VAN SUSCEPTIBILITY.     Performed at [facility]   Report Status 10/24/2013 FINAL   Final  ANAEROBIC CULTURE     Status: None   Collection Time    10/22/13  6:46 AM      Result Value Ref Range Status   Specimen Description WOUND BREAST RIGHT   Final   Special Requests NONE   Final   Gram Stain     Final   Value: ABUNDANT WBC PRESENT, PREDOMINANTLY PMN     FEW SQUAMOUS EPITHELIAL CELLS PRESENT     ABUNDANT GRAM POSITIVE COCCI IN PAIRS     IN CLUSTERS IN CHAINS FEW GRAM NEGATIVE COCCI     MODERATE GRAM NEGATIVE RODS   Culture     Final   Value: NO ANAEROBES ISOLATED; CULTURE IN PROGRESS FOR 5 DAYS     Performed at [facility]   Report Status PENDING   Incomplete  WOUND CULTURE     Status: None   Collection Time    10/22/13  6:46 AM      Result Value Ref Range Status   Specimen Description WOUND BREAST RIGHT   Final   Special Requests NONE   Final   Gram Stain     Final   Value: MODERATE WBC PRESENT, PREDOMINANTLY PMN     RARE SQUAMOUS EPITHELIAL CELLS PRESENT     ABUNDANT GRAM POSITIVE COCCI IN PAIRS     IN CHAINS FEW GRAM POSITIVE RODS     FEW GRAM NEGATIVE RODS   Culture     Final   Value: Culture reincubated for better growth     Performed at [facility]   Report Status PENDING   Incomplete  SURGICAL PCR SCREEN     Status: Abnormal   Collection Time    10/24/13  8:51 AM      Result Value Ref Range Status   MRSA, PCR POSITIVE (*) NEGATIVE Final   Staphylococcus aureus POSITIVE (*) NEGATIVE Final   Comment:            The Xpert SA Assay (FDA     approved for NASAL specimens     in patients over 78 years of age),     is one  component of     a comprehensive surveillance     program.  Test performance has     been validated by [employer] for patients greater     than or equal to 35 year old.     It is not intended     to diagnose infection nor to     guide or monitor treatment.     Studies:  Recent x-ray studies have been reviewed in detail by the Attending Physician       Erin Hearing, ANP [facility] Office  [phone] Pager [phone]  **Disclaimer: This note may have been dictated with voice recognition software. Similar sounding words can inadvertently be transcribed and this note may contain transcription errors which may not have been corrected upon publication of note.**   **If unable to reach the above provider after paging please contact the Flow Manager @ [phone]  On-Call/Text Page:      [email or web address]      password [email or web address]  If 7PM-7AM, please contact night-coverage [email or web address] Password [facility] 10/25/2013, 10:53 AM   LOS: 3 days   Have examined Pt w/ ANP Ebony Hail discussed A/P and agree with plan. Discussed  plan w/ Pt and answered all questions.  Greater then 40 min were spent in direct care of this Pt with MMP.

## 2013-10-25 NOTE — Progress Notes (Signed)
Patient ID: Carmen Stone, female   DOB: Apr 08, 1956, 58 y.o.   MRN: [id number] Path back today shows high grade cancer with dermal lymphatic invasion. 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  I discussed this with her and her daughter today.  She has no prior breast history.  I discussed with med onc.  Will get ct chest/a/p tonight.  I will discuss possible alnd with surgery also after ct done.

## 2013-10-25 NOTE — Progress Notes (Signed)
1 Day Post-Op  Subjective: No real complaints, hungry  Objective: Vital signs in last 24 hours: Temp:  [97.6 F (36.4 C)-100.2 F (37.9 C)] 98.7 F (37.1 C) (05/12 0700) Pulse Rate:  [88-105] 91 (05/12 0700) Resp:  [0-24] 18 (05/12 0700) BP: (91-138)/(37-96) 91/37 mmHg (05/12 0700) SpO2:  [98 %-100 %] 99 % (05/12 0700) Last BM Date: 10/22/13  Intake/Output from previous day: 05/11 0701 - 05/12 0700 In: 5283.5 [P.O.:600; I.V.:1930; Blood:1953.5; IV Piggyback:800] Out: 3625 [Urine:2125; Blood:1500] Intake/Output this shift:    General appearance: no distress Resp: clear to auscultation bilaterally Cardio: regular rate and rhythm GI: soft nontender Dressing dry  Lab Results:   Recent Labs  10/24/13 1520 10/25/13 0237  WBC 14.3* 9.9  HGB 7.6* 7.6*  HCT 22.7* 22.8*  PLT 332 395   BMET  Recent Labs  10/24/13 1219 10/25/13 0237  NA 137 137  K 3.9 3.6*  CL  --  103  CO2  --  25  GLUCOSE 289* 171*  BUN  --  6  CREATININE  --  0.55  CALCIUM  --  7.3*   PT/INR  Recent Labs  10/24/13 1215 10/24/13 1520  LABPROT 13.4 14.3  INR 1.04 1.13   ABG No results found for this basename: PHART, PCO2, PO2, HCO3,  in the last 72 hours  Studies/Results: No results found.  Anti-infectives: Anti-infectives   Start     Dose/Rate Route Frequency Ordered Stop   10/22/13 1215  clindamycin (CLEOCIN) IVPB 600 mg     600 mg 100 mL/hr over 30 Minutes Intravenous 4 times per day 10/22/13 1136     10/22/13 1215  vancomycin (VANCOCIN) IVPB 1000 mg/200 mL premix     1,000 mg 200 mL/hr over 60 Minutes Intravenous Every 12 hours 10/22/13 1142     10/22/13 0430  ceFEPIme (MAXIPIME) 1 g in dextrose 5 % 50 mL IVPB     1 g 100 mL/hr over 30 Minutes Intravenous  Once 10/22/13 0423 10/22/13 0552   10/22/13 0430  vancomycin (VANCOCIN) IVPB 1000 mg/200 mL premix  Status:  Discontinued     1,000 mg 200 mL/hr over 60 Minutes Intravenous  Once 10/22/13 0423 10/22/13 1142   10/22/13  0430  metroNIDAZOLE (FLAGYL) IVPB 500 mg  Status:  Discontinued     500 mg 100 mL/hr over 60 Minutes Intravenous  Once 10/22/13 0423 10/22/13 0424   10/22/13 0430  [MAR Hold]  clindamycin (CLEOCIN) IVPB 900 mg     (On MAR Hold since 10/22/13 0615)   900 mg 100 mL/hr over 30 Minutes Intravenous  Once 10/22/13 0424 10/22/13 0940      Assessment/Plan: POD3/1 NSTI s/p right mastectomy 1. Po and iv pain meds for pain control 2. pulm toilet, oob 3. hct ok this am, will recheck does not appear to have any blood loss ongoing by dressing 4. Reg diet today npo after mn 5. Medicine assisting with glucose control 6. No pharm dvt proph right now due to bleeding 7. Plan or tomorrow for dressing change, possible further debridement, I discussed surgery yesterday with patient and plan  [name] 10/25/2013

## 2013-10-26 ENCOUNTER — Encounter (HOSPITAL_COMMUNITY): Payer: BC Managed Care – PPO | Admitting: Certified Registered"

## 2013-10-26 ENCOUNTER — Inpatient Hospital Stay (HOSPITAL_COMMUNITY): Payer: BC Managed Care – PPO | Admitting: Certified Registered"

## 2013-10-26 ENCOUNTER — Encounter (HOSPITAL_COMMUNITY): Admission: EM | Disposition: A | Discharge status: Home Health | Payer: Self-pay | Source: Home / Self Care

## 2013-10-26 HISTORY — PX: DRESSING CHANGE UNDER ANESTHESIA: SHX5237

## 2013-10-26 LAB — BASIC METABOLIC PANEL
BUN: 9 mg/dL (ref 6–23)
BUN: 7 mg/dL (ref 6–23)
CALCIUM: 7.9 mg/dL — AB (ref 8.4–10.5)
CHLORIDE: 102 meq/L (ref 96–112)
CO2: 26 mEq/L (ref 19–32)
CO2: 22 mEq/L (ref 19–32)
CREATININE: 0.65 mg/dL (ref 0.50–1.10)
Calcium: 7.7 mg/dL — ABNORMAL LOW (ref 8.4–10.5)
Chloride: 104 mEq/L (ref 96–112)
Creatinine, Ser: 0.63 mg/dL (ref 0.50–1.10)
GFR calc Af Amer: >90 mL/min (ref >90)
GFR calc non Af Amer: >90 mL/min (ref >90)
GFR calc non Af Amer: >90 mL/min (ref >90)
GLUCOSE: 123 mg/dL — AB (ref 70–99)
GLUCOSE: 187 mg/dL — AB (ref 70–99)
POTASSIUM: 3.5 meq/L — AB (ref 3.7–5.3)
Potassium: 4 mEq/L (ref 3.7–5.3)
Sodium: 139 mEq/L (ref 137–147)
Sodium: 138 mEq/L (ref 137–147)

## 2013-10-26 LAB — CBC
HCT: 27.4 % — ABNORMAL LOW (ref 36.0–46.0)
HEMATOCRIT: 28.6 % — AB (ref 36.0–46.0)
HEMOGLOBIN: 9.1 g/dL — AB (ref 12.0–15.0)
Hemoglobin: 9.8 g/dL — ABNORMAL LOW (ref 12.0–15.0)
MCH: 27.7 pg (ref 26.0–34.0)
MCH: 28.9 pg (ref 26.0–34.0)
MCHC: 33.2 g/dL (ref 30.0–36.0)
MCHC: 34.3 g/dL (ref 30.0–36.0)
MCV: 83.5 fL (ref 78.0–100.0)
MCV: 84.4 fL (ref 78.0–100.0)
Platelets: 432 10*3/uL — ABNORMAL HIGH (ref 150–400)
Platelets: 438 10*3/uL — ABNORMAL HIGH (ref 150–400)
RBC: 3.28 MIL/uL — ABNORMAL LOW (ref 3.87–5.11)
RBC: 3.39 MIL/uL — ABNORMAL LOW (ref 3.87–5.11)
RDW: 14.2 % (ref 11.5–15.5)
RDW: 14.3 % (ref 11.5–15.5)
WBC: 11.4 10*3/uL — ABNORMAL HIGH (ref 4.0–10.5)
WBC: 18.3 10*3/uL — AB (ref 4.0–10.5)

## 2013-10-26 LAB — URINE CULTURE

## 2013-10-26 LAB — GLUCOSE, CAPILLARY
Glucose-Capillary: 115 mg/dL — ABNORMAL HIGH (ref 70–99)
Glucose-Capillary: 148 mg/dL — ABNORMAL HIGH (ref 70–99)
Glucose-Capillary: 86 mg/dL (ref 70–99)
Glucose-Capillary: 157 mg/dL — ABNORMAL HIGH (ref 70–99)
Glucose-Capillary: 183 mg/dL — ABNORMAL HIGH (ref 70–99)
Glucose-Capillary: 95 mg/dL (ref 70–99)

## 2013-10-26 LAB — PREPARE RBC (CROSSMATCH)

## 2013-10-26 LAB — VANCOMYCIN, TROUGH: Vancomycin Tr: 13.3 ug/mL (ref 10.0–20.0)

## 2013-10-26 SURGERY — REPLACEMENT, DRESSING, WITH ANESTHESIA
Anesthesia: General | Site: Breast | Laterality: Right

## 2013-10-26 MED ORDER — HYDROMORPHONE HCL PF 1 MG/ML IJ SOLN
INTRAMUSCULAR | Status: AC
  Administered 2013-10-26: 16:00:00
  Filled 2013-10-26: qty 1

## 2013-10-26 MED ORDER — NEOSTIGMINE METHYLSULFATE 10 MG/10ML IV SOLN
INTRAVENOUS | Status: AC
  Filled 2013-10-26: qty 1

## 2013-10-26 MED ORDER — FENTANYL CITRATE 0.05 MG/ML IJ SOLN
INTRAMUSCULAR | Status: AC
  Filled 2013-10-26: qty 5

## 2013-10-26 MED ORDER — PHENYLEPHRINE 40 MCG/ML (10ML) SYRINGE FOR IV PUSH (FOR BLOOD PRESSURE SUPPORT)
PREFILLED_SYRINGE | INTRAVENOUS | Status: AC
  Filled 2013-10-26: qty 10

## 2013-10-26 MED ORDER — ONDANSETRON HCL 4 MG/2ML IJ SOLN
INTRAMUSCULAR | Status: AC
  Filled 2013-10-26: qty 2

## 2013-10-26 MED ORDER — OXYCODONE HCL 5 MG PO TABS: 5.0000 mg | ORAL_TABLET | Freq: Once | ORAL | Status: DC | PRN

## 2013-10-26 MED ORDER — ALBUMIN HUMAN 5 % IV SOLN
INTRAVENOUS | Status: DC | PRN
  Administered 2013-10-26: 13:00:00 via INTRAVENOUS

## 2013-10-26 MED ORDER — BACITRACIN ZINC 500 UNIT/GM EX OINT
TOPICAL_OINTMENT | CUTANEOUS | Status: DC | PRN
  Administered 2013-10-26: 1 via TOPICAL

## 2013-10-26 MED ORDER — DOUBLE ANTIBIOTIC 500-10000 UNIT/GM EX OINT
TOPICAL_OINTMENT | CUTANEOUS | Status: AC
  Filled 2013-10-26: qty 1

## 2013-10-26 MED ORDER — 0.9 % SODIUM CHLORIDE (POUR BTL) OPTIME
TOPICAL | Status: DC | PRN
  Administered 2013-10-26: 1000 mL

## 2013-10-26 MED ORDER — PROPOFOL 10 MG/ML IV BOLUS
INTRAVENOUS | Status: AC
  Filled 2013-10-26: qty 20

## 2013-10-26 MED ORDER — HYDROMORPHONE HCL PF 1 MG/ML IJ SOLN
0.2500 mg | INTRAMUSCULAR | Status: DC | PRN
  Administered 2013-10-26 (×2): 0.5 mg via INTRAVENOUS

## 2013-10-26 MED ORDER — MIDAZOLAM HCL 2 MG/2ML IJ SOLN
INTRAMUSCULAR | Status: AC
  Filled 2013-10-26: qty 2

## 2013-10-26 MED ORDER — FENTANYL CITRATE 0.05 MG/ML IJ SOLN
INTRAMUSCULAR | Status: DC | PRN
  Administered 2013-10-26: 150 ug via INTRAVENOUS
  Administered 2013-10-26 (×4): 50 ug via INTRAVENOUS

## 2013-10-26 MED ORDER — POTASSIUM CHLORIDE 10 MEQ/100ML IV SOLN
10.0000 meq | INTRAVENOUS | Status: AC
  Administered 2013-10-26: 10 meq via INTRAVENOUS
  Filled 2013-10-26: qty 100

## 2013-10-26 MED ORDER — SUCCINYLCHOLINE CHLORIDE 20 MG/ML IJ SOLN
INTRAMUSCULAR | Status: AC
  Filled 2013-10-26: qty 1

## 2013-10-26 MED ORDER — ENOXAPARIN SODIUM 40 MG/0.4ML ~~LOC~~ SOLN
40.0000 mg | SUBCUTANEOUS | Status: DC
  Administered 2013-10-27: 40 mg via SUBCUTANEOUS
  Filled 2013-10-26 (×2): qty 0.4

## 2013-10-26 MED ORDER — OXYCODONE HCL 5 MG/5ML PO SOLN: 5.0000 mg | Freq: Once | ORAL | Status: DC | PRN

## 2013-10-26 MED ORDER — LIDOCAINE HCL (CARDIAC) 20 MG/ML IV SOLN
INTRAVENOUS | Status: DC | PRN
  Administered 2013-10-26: 80 mg via INTRAVENOUS

## 2013-10-26 MED ORDER — LACTATED RINGERS IV SOLN
INTRAVENOUS | Status: DC
  Administered 2013-10-26 – 2013-10-29 (×3): via INTRAVENOUS

## 2013-10-26 MED ORDER — LIDOCAINE HCL (CARDIAC) 20 MG/ML IV SOLN
INTRAVENOUS | Status: AC
  Filled 2013-10-26: qty 5

## 2013-10-26 MED ORDER — PROPOFOL 10 MG/ML IV BOLUS
INTRAVENOUS | Status: DC | PRN
  Administered 2013-10-26: 120 mg via INTRAVENOUS

## 2013-10-26 MED ORDER — VANCOMYCIN HCL IN DEXTROSE 1-5 GM/200ML-% IV SOLN
INTRAVENOUS | Status: AC
  Filled 2013-10-26: qty 200

## 2013-10-26 MED ORDER — ONDANSETRON HCL 4 MG/2ML IJ SOLN
INTRAMUSCULAR | Status: DC | PRN
  Administered 2013-10-26: 4 mg via INTRAVENOUS

## 2013-10-26 MED ORDER — ROCURONIUM BROMIDE 50 MG/5ML IV SOLN
INTRAVENOUS | Status: AC
  Filled 2013-10-26: qty 1

## 2013-10-26 MED ORDER — MIDAZOLAM HCL 5 MG/5ML IJ SOLN
INTRAMUSCULAR | Status: DC | PRN
  Administered 2013-10-26: 2 mg via INTRAVENOUS

## 2013-10-26 MED ORDER — NEOSTIGMINE METHYLSULFATE 10 MG/10ML IV SOLN
INTRAVENOUS | Status: DC | PRN
  Administered 2013-10-26: 3 mg via INTRAVENOUS

## 2013-10-26 MED ORDER — GLYCOPYRROLATE 0.2 MG/ML IJ SOLN
INTRAMUSCULAR | Status: DC | PRN
  Administered 2013-10-26: 0.4 mg via INTRAVENOUS

## 2013-10-26 MED ORDER — GLYCOPYRROLATE 0.2 MG/ML IJ SOLN
INTRAMUSCULAR | Status: AC
  Filled 2013-10-26: qty 2

## 2013-10-26 MED ORDER — ROCURONIUM BROMIDE 100 MG/10ML IV SOLN
INTRAVENOUS | Status: DC | PRN
  Administered 2013-10-26: 50 mg via INTRAVENOUS

## 2013-10-26 MED ORDER — ONDANSETRON HCL 4 MG/2ML IJ SOLN: 4.0000 mg | Freq: Once | INTRAMUSCULAR | Status: DC | PRN

## 2013-10-26 MED ORDER — LACTATED RINGERS IV SOLN
INTRAVENOUS | Status: DC | PRN
  Administered 2013-10-26: 12:00:00 via INTRAVENOUS

## 2013-10-26 SURGICAL SUPPLY — 44 items
APPLIER CLIP 9.375 MED OPEN (MISCELLANEOUS) ×3
APR CLP MED 9.3 20 MLT OPN (MISCELLANEOUS) ×1
BANDAGE GAUZE ELAST BULKY 4 IN (GAUZE/BANDAGES/DRESSINGS) IMPLANT
CANISTER SUCTION 2500CC (MISCELLANEOUS) ×3 IMPLANT
CLIP APPLIE 9.375 MED OPEN (MISCELLANEOUS) IMPLANT
COVER SURGICAL LIGHT HANDLE (MISCELLANEOUS) ×3 IMPLANT
DRAIN CHANNEL 19F RND (DRAIN) ×4 IMPLANT
DRAPE LAPAROSCOPIC ABDOMINAL (DRAPES) ×2 IMPLANT
DRAPE PED LAPAROTOMY (DRAPES) IMPLANT
DRSG PAD ABDOMINAL 8X10 ST (GAUZE/BANDAGES/DRESSINGS) IMPLANT
ELECT BLADE 4.0 EZ CLEAN MEGAD (MISCELLANEOUS) ×3
ELECT REM PT RETURN 9FT ADLT (ELECTROSURGICAL) ×3
ELECTRODE BLDE 4.0 EZ CLN MEGD (MISCELLANEOUS) IMPLANT
ELECTRODE REM PT RTRN 9FT ADLT (ELECTROSURGICAL) ×1 IMPLANT
EVACUATOR SILICONE 100CC (DRAIN) ×4 IMPLANT
GLOVE BIO SURGEON STRL SZ7 (GLOVE) ×3 IMPLANT
GLOVE BIO SURGEON STRL SZ7.5 (GLOVE) ×2 IMPLANT
GLOVE BIOGEL PI IND STRL 7.0 (GLOVE) IMPLANT
GLOVE BIOGEL PI IND STRL 7.5 (GLOVE) ×1 IMPLANT
GLOVE BIOGEL PI INDICATOR 7.0 (GLOVE) ×2
GLOVE BIOGEL PI INDICATOR 7.5 (GLOVE) ×4
GLOVE SURG SS PI 7.5 STRL IVOR (GLOVE) ×2 IMPLANT
GOWN STRL REUS W/ TWL LRG LVL3 (GOWN DISPOSABLE) ×2 IMPLANT
GOWN STRL REUS W/TWL LRG LVL3 (GOWN DISPOSABLE) ×9
KIT BASIN OR (CUSTOM PROCEDURE TRAY) ×3 IMPLANT
KIT ROOM TURNOVER OR (KITS) ×3 IMPLANT
NS IRRIG 1000ML POUR BTL (IV SOLUTION) ×3 IMPLANT
PACK GENERAL/GYN (CUSTOM PROCEDURE TRAY) ×3 IMPLANT
PAD ABD 8X10 STRL (GAUZE/BANDAGES/DRESSINGS) ×4 IMPLANT
PAD ARMBOARD 7.5X6 YLW CONV (MISCELLANEOUS) ×3 IMPLANT
SPONGE GAUZE 4X4 12PLY (GAUZE/BANDAGES/DRESSINGS) IMPLANT
SPONGE GAUZE 4X4 12PLY STER LF (GAUZE/BANDAGES/DRESSINGS) ×2 IMPLANT
SPONGE LAP 18X18 X RAY DECT (DISPOSABLE) ×8 IMPLANT
STAPLER VISISTAT 35W (STAPLE) ×2 IMPLANT
SUT ETHILON 2 0 FS 18 (SUTURE) ×24 IMPLANT
SUT SILK 2 0 SH CR/8 (SUTURE) ×6 IMPLANT
SUT VIC AB 3-0 SH 18 (SUTURE) ×4 IMPLANT
SUT VIC AB 3-0 SH 27 (SUTURE) ×3
SUT VIC AB 3-0 SH 27XBRD (SUTURE) IMPLANT
TAPE CLOTH SURG 6X10 WHT LF (GAUZE/BANDAGES/DRESSINGS) ×2 IMPLANT
TOWEL OR 17X24 6PK STRL BLUE (TOWEL DISPOSABLE) ×3 IMPLANT
TOWEL OR 17X26 10 PK STRL BLUE (TOWEL DISPOSABLE) ×3 IMPLANT
TUBE CONNECTING 12'X1/4 (SUCTIONS) ×1
TUBE CONNECTING 12X1/4 (SUCTIONS) ×1 IMPLANT

## 2013-10-26 NOTE — Op Note (Signed)
Preoperative diagnosis: necrotizing soft tissue infection right breast s/p debridement, right inflammatory breast cancer Postoperative diagnosis: same as above Procedure: Completion right mastectomy Right axillary node dissection Surgeon: Dr Serita Grammes Anesthesia: general EBL: 550 cc Fluids: one pack prbcs Specimen: right breast, right axillary nodes Sponge and needle count correct times two at completion Disposition to recovery stable  Indications: This is a 6 yof with diabetes who presented Saturday with a necrotizing soft tissue infection of the right breast. This was opened and debrided by my partner Dr Redmond Pulling.  I returned him to the OR on Monday and her entire right breast was necrotic.  I removed the rest of the dead tissue as well as a fair amount of skin present.  The pathology came back as inflammatory breast cancer.  I needed to return her to or today for further debridement and we discussed basically a completion mastectomy.  She has no metastatic disease on ct chest/ab/pelvis.  I discussed possible alnd as well.  Although this is not the standard treatment I think this is best option given presentation to hopefully get her to chemo quickly.  Procedure: after informed consent was obtained, the patient was taken to the operating room.  She was already on antibiotics.  Sequential compression devices were on the legs.  She was placed under general anesthesia without complication.  She was then prepped and draped in that standard sterile surgical fashion. A surgical timeout was then performed.  I removed the old dressing.  I then proceeded to remove the remaining breast tissue to the normal margins of a mastectomy.  Some of this tissue was necrotic still and I removed to viable tissue.  I then entered the axilla.  I identified the thoracodorsal bundle and the axillary vein.  I removed all the enlarged nodes caudad to this.  I then obtained hemostasis.  I then was able to close under some  tension with 3-0 vicryl sutures and then external 2-0 nylon sutures.  I placed 2 19 Fr Blake drains.  These were secured with 2-0 nylon.  I then placed bacitracin on the wound and a dressing. She tolerated this well, was extubated and transferred to pacu stable.

## 2013-10-26 NOTE — Progress Notes (Addendum)
Pharmacy: Vancomycin  58yof continues on day # 5 vancomycin/clindamycin for right breast abscess with areas of necrosis. S/P I&D on 5/11 and will get total mastectomy later today. Vancomycin trough drawn today is therapeutic at 13.3 (goal 10-15). Renal function is stable.  5/9 cefepime x 1 5/9 clindamycin>> 5/9 vancomycin>>  5/9 Blood x 2>>ngtd 5/9 wound >>Group A strep 5/9 anaerobes>>ngtd 5/12 urine cx (post abx initiation)>> in process  Plan: 1) Continue vancomycin 1g IV q12 2) Continue clindamycin 600mg  IV q6 3) Continue to follow cultures, LOT  Nena Jordan, PharmD, BCPS 10/26/2013, 11:11AM

## 2013-10-26 NOTE — Transfer of Care (Signed)
Immediate Anesthesia Transfer of Care Note  Patient: Carmen Stone  Procedure(s) Performed: Procedure(s): COMPLETION MASTECTOMY, AXILLARY DISSECTION (Right)  Patient Location: PACU  Anesthesia Type:General  Level of Consciousness: awake, alert  and oriented  Airway & Oxygen Therapy: Patient connected to face mask oxygen  Post-op Assessment: Report given to PACU RN  Post vital signs: stable  Complications: No apparent anesthesia complications

## 2013-10-26 NOTE — Anesthesia Procedure Notes (Signed)
Procedure Name: Intubation Date/Time: 10/26/2013 12:22 PM Performed by: Maeola Harman Pre-anesthesia Checklist: Patient identified, Emergency Drugs available, Suction available, Patient being monitored and Timeout performed Patient Re-evaluated:Patient Re-evaluated prior to inductionOxygen Delivery Method: Circle system utilized Preoxygenation: Pre-oxygenation with 100% oxygen Intubation Type: IV induction Ventilation: Mask ventilation without difficulty Laryngoscope Size: Mac and 3 Grade View: Grade I Tube size: 7.5 mm Number of attempts: 1 Airway Equipment and Method: Stylet Placement Confirmation: ETT inserted through vocal cords under direct vision,  positive ETCO2 and breath sounds checked- equal and bilateral Secured at: 21 cm Tube secured with: Tape Dental Injury: Teeth and Oropharynx as per pre-operative assessment  Comments: Easy atraumatic induction and intubation with MAC 3 blade.  Dr. Conrad Triumph verified placement of ETT.  Carmen Session, CRNA

## 2013-10-26 NOTE — Progress Notes (Signed)
Her ct scan does not show metastatic disease. This certainly is not the normal way to care for breast cancer but she presented with a nsti of there breast that is growing multiple organisms on culture.  She does have inflammatory breast cancer though and I think going back and doing completion mastectomy with an alnd is the best treatment for her cancer.  Then will have to get the wound healed.  She will need chemotherapy but I don't want to put a port in until the infection has cleared. Her prognostic panel is still pending now.  We discussed risks of alnd and plan for treatment.

## 2013-10-26 NOTE — Progress Notes (Signed)
[name]  ANDRIENNE HAVENER WVP:[id number] DOB: Sep 02, 1955 DOA: 10/22/2013 PCP: [name], MD  Time spent :  Brief narrative: 58 y.o. female, with history of type 2 diabetes mellitus who is on Glucophage, no other known medical problems, was admitted by general surgery service for right breast drainage due to right breast abscess, she was taken to the OR on 10/24/2013 for incision and drainage, [facility] was consulted to manage diabetes.  HPI/Subjective: Sts pain well controlled. No questions re: her new cancer diagnosis  Assessment/Plan: Active Problems:   DIABETES MELLITUS, TYPE II -moderate control in setting of necrotizing infection -cont current Lantus and SSI -poor control pre admit: HgbA1c 11.8 -may benefit from diabetes education prior to discharge    Acute respiratory failure with hypoxia -CXR unremarkable -wean and dc oxygen    Postoperative anemia due to acute blood loss -baseline hgb ~9.6 -now 7.6 -transfused 2 units on 5/12 since SBP 90s and c/w sx's-5/13 Hgb up to 9.1   Abnormal urinalysis -appears c/w UTI -cx pending- obtained after anbx's started    Breast abscess of female/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 rate 17, height _0  (1.626 m), weight 164 lb 5 oz (74.532 kg), SpO2 97.00%.  Intake/Output Summary (Last 24 hours) at 10/26/13  1021 Last data filed at 10/26/13 1008  Gross per 24 hour  Intake   3370 ml  Output   2875 ml  Net    495 ml     Exam: General: No acute respiratory distress Breast: right mastectomy site drsg CDI- no RUE swelling Lungs: Clear to auscultation bilaterally without wheezes or crackles, RA Cardiovascular: Regular rate and rhythm without murmur gallop or rub normal S1 and S2, no peripheral edema or JVD Abdomen: Nontender, nondistended, soft, bowel sounds positive, no rebound, no ascites, no appreciable mass Musculoskeletal: No significant cyanosis, clubbing of bilateral lower extremities Neurological: Alert and oriented x 3, moves all extremities x 4 without focal neurological deficits, CN 2-12 intact  Scheduled Meds:  Scheduled Meds: . Chlorhexidine Gluconate Cloth  6 each Topical Q0600  . clindamycin (CLEOCIN) IV  600 mg Intravenous 4 times per day  . enoxaparin (LOVENOX) injection  40 mg Subcutaneous Q24H  . insulin aspart  0-20 Units Subcutaneous TID WC  . insulin aspart  0-5 Units Subcutaneous QHS  . insulin glargine  14 Units Subcutaneous Daily  . insulin starter kit- syringes  1 kit Other Once  . mupirocin ointment  1 application Nasal BID  . potassium chloride  10 mEq Intravenous Q1 Hr x 4  . vancomycin  1,000 mg Intravenous Q12H   Continuous Infusions: . sodium chloride 1,000 mL (10/26/13 0000)    Data Reviewed: Basic Metabolic Panel:  Recent Labs Lab 10/22/13 0238 10/24/13 1219 10/25/13 0237 10/26/13 0225  NA 133* 137 137 139  K 4.1 3.9 3.6* 3.5*  CL 95*  --  103 104  CO2 23  --  25 26  GLUCOSE  306* 289* 171* 123*  BUN 9  --  6 9  CREATININE 0.48*  --  0.55 0.63  CALCIUM 8.8  --  7.3* 7.9*   Liver Function Tests:  Recent Labs Lab 10/22/13 0238  AST 12  ALT 10  ALKPHOS 82  BILITOT 0.3  PROT 7.1  ALBUMIN 2.4*   No results found for this basename: LIPASE, AMYLASE,  in the last 168 hours No results found for this basename: AMMONIA,  in the last 168  hours CBC:  Recent Labs Lab 10/22/13 0238  10/24/13 1215 10/24/13 1219 10/24/13 1520 10/25/13 0237 10/25/13 0905 10/26/13 0225  WBC 15.9*  < > 18.3*  --  14.3* 9.9 9.1 11.4*  NEUTROABS 12.0*  --   --   --   --   --   --   --   HGB 9.6*  < > 7.2* 7.8* 7.6* 7.6* 8.0* 9.1*  HCT 29.8*  < > 21.6* 23.0* 22.7* 22.8* 24.0* 27.4*  MCV 83.9  < > 85.0  --  83.8 81.4 83.9 83.5  PLT 491*  < > 376  --  332 395 390 432*  < > = values in this interval not displayed. Cardiac Enzymes: No results found for this basename: CKTOTAL, CKMB, CKMBINDEX, TROPONINI,  in the last 168 hours BNP (last 3 results) No results found for this basename: PROBNP,  in the last 8760 hours CBG:  Recent Labs Lab 10/25/13 0748 10/25/13 1129 10/25/13 1810 10/25/13 2143 10/26/13 0812  GLUCAP 186* 247* 125* 115* 148*    Recent Results (from the past 240 hour(s))  CULTURE, BLOOD (ROUTINE X 2)     Status: None   Collection Time    10/22/13  3:03 AM      Result Value Ref Range Status   Specimen Description BLOOD LEFT ANTECUBITAL   Final   Special Requests BOTTLES DRAWN AEROBIC AND ANAEROBIC 10CC EACH   Final   Culture  Setup Time     Final   Value: 10/22/2013 12:09     Performed at [facility]   Culture     Final   Value:        BLOOD CULTURE RECEIVED NO GROWTH TO DATE CULTURE WILL BE HELD FOR 5 DAYS BEFORE ISSUING A FINAL NEGATIVE REPORT     Performed at [facility]   Report Status PENDING   Incomplete  CULTURE, BLOOD (ROUTINE X 2)     Status: None   Collection Time    10/22/13  3:08 AM      Result Value Ref Range Status   Specimen Description BLOOD RIGHT ANTECUBITAL   Final   Special Requests BOTTLES DRAWN AEROBIC ONLY 10CC   Final   Culture  Setup Time     Final   Value: 10/22/2013 12:09     Performed at [facility]   Culture     Final   Value:        BLOOD CULTURE RECEIVED NO GROWTH TO DATE CULTURE WILL BE HELD FOR 5 DAYS BEFORE ISSUING A FINAL NEGATIVE REPORT     Performed  at [facility]   Report Status PENDING   Incomplete  WOUND CULTURE     Status: None   Collection Time    10/22/13  4:35 AM      Result Value Ref Range Status   Specimen Description WOUND   Final   Special Requests BREAST   Final   Gram Stain  Final   Value: MODERATE WBC PRESENT, PREDOMINANTLY PMN     RARE SQUAMOUS EPITHELIAL CELLS PRESENT     FEW GRAM NEGATIVE RODS     FEW GRAM POSITIVE RODS     MODERATE GRAM POSITIVE COCCI IN PAIRS   Culture     Final   Value: MODERATE GROUP B STREP(S.AGALACTIAE)ISOLATED     Note: TESTING AGAINST S. AGALACTIAE NOT ROUTINELY PERFORMED DUE TO PREDICTABILITY OF AMP/PEN/VAN SUSCEPTIBILITY.     Performed at [facility]   Report Status 10/24/2013 FINAL   Final  ANAEROBIC CULTURE     Status: None   Collection Time    10/22/13  6:46 AM      Result Value Ref Range Status   Specimen Description WOUND BREAST RIGHT   Final   Special Requests NONE   Final   Gram Stain     Final   Value: ABUNDANT WBC PRESENT, PREDOMINANTLY PMN     FEW SQUAMOUS EPITHELIAL CELLS PRESENT     ABUNDANT GRAM POSITIVE COCCI IN PAIRS     IN CLUSTERS IN CHAINS FEW GRAM NEGATIVE COCCI     MODERATE GRAM NEGATIVE RODS   Culture     Final   Value: NO ANAEROBES ISOLATED; CULTURE IN PROGRESS FOR 5 DAYS     Performed at [facility]   Report Status PENDING   Incomplete  WOUND CULTURE     Status: None   Collection Time    10/22/13  6:46 AM      Result Value Ref Range Status   Specimen Description WOUND BREAST RIGHT   Final   Special Requests NONE   Final   Gram Stain     Final   Value: MODERATE WBC PRESENT, PREDOMINANTLY PMN     RARE SQUAMOUS EPITHELIAL CELLS PRESENT     ABUNDANT GRAM POSITIVE COCCI IN PAIRS     IN CHAINS FEW GRAM POSITIVE RODS     FEW GRAM NEGATIVE RODS   Culture     Final   Value: MODERATE GROUP B STREP(S.AGALACTIAE)ISOLATED     Note: TESTING AGAINST S. AGALACTIAE NOT ROUTINELY PERFORMED DUE TO PREDICTABILITY OF AMP/PEN/VAN  SUSCEPTIBILITY.     Performed at [facility]   Report Status 10/25/2013 FINAL   Final  SURGICAL PCR SCREEN     Status: Abnormal   Collection Time    10/24/13  8:51 AM      Result Value Ref Range Status   MRSA, PCR POSITIVE (*) NEGATIVE Final   Staphylococcus aureus POSITIVE (*) NEGATIVE Final   Comment:            The Xpert SA Assay (FDA     approved for NASAL specimens     in patients over 36 years of age),     is one component of     a comprehensive surveillance     program.  Test performance has     been validated by [employer] for patients greater     than or equal to 82 year old.     It is not intended     to diagnose infection nor to     guide or monitor treatment.     Studies:  Recent x-ray studies have been reviewed in detail by the Attending Physician       Erin Hearing, ANP [facility] Office  [phone] Pager [phone]  **Disclaimer: This note may have been dictated with voice recognition software. Similar sounding words  can inadvertently be transcribed and this note may contain transcription errors which may not have been corrected upon publication of note.**   **If unable to reach the above provider after paging please contact the Flow Manager @ [phone]  On-Call/Text Page:      [email or web address]      password [email or web address]  If 7PM-7AM, please contact night-coverage [email or web address] Password [email or web address] 10/26/2013, 10:21 AM   LOS: 4 days    Examining patient with ANP Ebony Hail and discussed assessment and plan of care. Agree with plan of care. Discuss plan of care with patient and answered all questions. Patient care greater than 35 minute

## 2013-10-26 NOTE — Anesthesia Postprocedure Evaluation (Signed)
  Anesthesia Post-op Note  Patient: Carmen Stone  Procedure(s) Performed: Procedure(s): COMPLETION MASTECTOMY, AXILLARY DISSECTION (Right)  Patient Location: PACU  Anesthesia Type:General  Level of Consciousness: awake, alert  and oriented  Airway and Oxygen Therapy: Patient Spontanous Breathing and Patient connected to nasal cannula oxygen  Post-op Pain: mild  Post-op Assessment: Post-op Vital signs reviewed, Patient's Cardiovascular Status Stable, Respiratory Function Stable, Patent Airway and Pain level controlled  Post-op Vital Signs: stable  Last Vitals:  Filed Vitals:   10/26/13 1530  BP: 150/69  Pulse: 92  Temp:   Resp: 12    Complications: No apparent anesthesia complications

## 2013-10-26 NOTE — Progress Notes (Signed)
Patient ID: Carmen Stone, female   DOB: 02-15-1956, 58 y.o.   MRN: [id number] 2 Days Post-Op  Subjective: Pt feels ok this morning.  Pain is well controlled.  hungry  Objective: Vital signs in last 24 hours: Temp:  [98.5 F (36.9 C)-100 F (37.8 C)] 98.5 F (36.9 C) (05/13 0800) Pulse Rate:  [90-107] 93 (05/12 2000) Resp:  [13-25] 13 (05/13 0800) BP: (85-134)/(37-70) 113/42 mmHg (05/13 0800) SpO2:  [96 %-100 %] 97 % (05/13 0800) Last BM Date: 10/26/13  Intake/Output from previous day: 05/12 0701 - 05/13 0700 In: 5035 [P.O.:650; I.V.:1650; Blood:670; IV Piggyback:600] Out: 3675 [Urine:3675] Intake/Output this shift: Total I/O In: 100 [I.V.:100] Out: -   PE: Breast: breast binder in place.  Wound dressing not taken down given she will return to the OR today  Lab Results:   Recent Labs  10/25/13 0905 10/26/13 0225  WBC 9.1 11.4*  HGB 8.0* 9.1*  HCT 24.0* 27.4*  PLT 390 432*   BMET  Recent Labs  10/25/13 0237 10/26/13 0225  NA 137 139  K 3.6* 3.5*  CL 103 104  CO2 25 26  GLUCOSE 171* 123*  BUN 6 9  CREATININE 0.55 0.63  CALCIUM 7.3* 7.9*   PT/INR  Recent Labs  10/24/13 1215 10/24/13 1520  LABPROT 13.4 14.3  INR 1.04 1.13   CMP     Component Value Date/Time   NA 139 10/26/2013 0225   K 3.5* 10/26/2013 0225   CL 104 10/26/2013 0225   CO2 26 10/26/2013 0225   GLUCOSE 123* 10/26/2013 0225   BUN 9 10/26/2013 0225   CREATININE 0.63 10/26/2013 0225   CALCIUM 7.9* 10/26/2013 0225   PROT 7.1 10/22/2013 0238   ALBUMIN 2.4* 10/22/2013 0238   AST 12 10/22/2013 0238   ALT 10 10/22/2013 0238   ALKPHOS 82 10/22/2013 0238   BILITOT 0.3 10/22/2013 0238   GFRNONAA >90 10/26/2013 0225   GFRAA >90 10/26/2013 0225   Lipase     Component Value Date/Time   LIPASE 16 06/08/2010 0430       Studies/Results: Ct Chest W Contrast  10/26/2013   CLINICAL DATA:  58 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 Evaluate for metastatic disease.  EXAM: CT CHEST,  ABDOMEN, AND PELVIS WITH CONTRAST  TECHNIQUE: Multidetector CT imaging of the chest, abdomen and pelvis was performed following the standard protocol during bolus administration of intravenous contrast.  CONTRAST:  189mL OMNIPAQUE IOHEXOL 300 MG/ML  SOLN  COMPARISON:  CT of abdomen pelvis 06/21/2010.  FINDINGS: CT CHEST FINDINGS  Mediastinum: Heart size is normal. There is no significant pericardial fluid, thickening or pericardial calcification. No pathologically enlarged mediastinal, bilateral hilar or internal mammary lymph nodes. Esophagus is unremarkable in appearance.  Lungs/Pleura: Trace bilateral pleural effusions layering dependently with some mild dependent subsegmental atelectasis in the lower lobes of the lungs bilaterally. No acute consolidative airspace disease. No suspicious appearing pulmonary nodules or masses.  Musculoskeletal: Postoperative changes in the right breast are noted, with a large open wound filled with packing material. The periphery of the open wound demonstrates a thick rind of soft tissue, some of which appears to enhance. In the medial aspect of the right breast (image 27 of series 2) there is a 1.5 x 1.3 cm enhancing soft tissue lesion, suspicious for a focus of residual tumor or a malignant lymph node. Numerous borderline enlarged and mildly enlarged right axillary lymph nodes are noted, measuring up to 12 mm in short axis. There are  no aggressive appearing lytic or blastic lesions noted in the visualized portions of the skeleton.  CT ABDOMEN AND PELVIS FINDINGS  Abdomen/Pelvis: Several well-defined low-attenuation lesions in the liver appear unchanged in size, number and pattern of distribution compared to prior study 2012, presumably small cysts. The largest of these lesions measures 2.1 cm in segment 4A. No other suspicious hepatic lesions are noted. Status post cholecystectomy. The appearance of the pancreas, spleen, bilateral adrenal glands and bilateral kidneys is  unremarkable.  Normal appendix (retrocecal in position). No significant volume of ascites. No pneumoperitoneum. No pathologic distention of small bowel. There are a few colonic diverticulae, particularly in the region of the sigmoid colon, without surrounding inflammatory changes to suggest an acute diverticulitis at this time. Several small ventral hernias are noted, containing several loops of small bowel and a short segment of the mid transverse colon, without evidence of bowel incarceration or obstruction at this time. No lymphadenopathy identified within the abdomen or pelvis. Status post hysterectomy. Ovaries are not confidently identified may be surgically absent or atrophic.  Musculoskeletal: There are no aggressive appearing lytic or blastic lesions noted in the visualized portions of the skeleton. Edema throughout the fat of the right flank, presumably reactive to the process in the right breast.  IMPRESSION: 1. Postoperative changes of recent right breast surgery with packed open wound which has a thick rind of soft tissue, some of which enhances, a 1.3 x 1.5 cm enhancing lesion in the medial aspect of the right breast which may represent residual tumor or a malignant lymph node, and multiple enlarged right axillary nodes concerning for axillary nodal metastases. 2. No other definite signs of metastatic disease in the chest, abdomen or pelvis. 3. Multiple small ventral hernias containing several loops of small bowel, as well as the mid transverse colon, without associated bowel incarceration or obstruction at this time. 4. Trace bilateral pleural effusions with mild dependent subsegmental atelectasis in the lower lobes of the lungs bilaterally. 5. Status post cholecystectomy and hysterectomy. 6. Additional incidental findings, as above.   Electronically Signed   By: Vinnie Langton M.D.   On: 10/26/2013 08:35   Ct Abdomen Pelvis W Contrast  10/26/2013   CLINICAL DATA:  58 year old female with history  of inflammatory breast cancer. Evaluate for metastatic disease.  EXAM: CT CHEST, ABDOMEN, AND PELVIS WITH CONTRAST  TECHNIQUE: Multidetector CT imaging of the chest, abdomen and pelvis was performed following the standard protocol during bolus administration of intravenous contrast.  CONTRAST:  145mL OMNIPAQUE IOHEXOL 300 MG/ML  SOLN  COMPARISON:  CT of abdomen pelvis 06/21/2010.  FINDINGS: CT CHEST FINDINGS  Mediastinum: Heart size is normal. There is no significant pericardial fluid, thickening or pericardial calcification. No pathologically enlarged mediastinal, bilateral hilar or internal mammary lymph nodes. Esophagus is unremarkable in appearance.  Lungs/Pleura: Trace bilateral pleural effusions layering dependently with some mild dependent subsegmental atelectasis in the lower lobes of the lungs bilaterally. No acute consolidative airspace disease. No suspicious appearing pulmonary nodules or masses.  Musculoskeletal: Postoperative changes in the right breast are noted, with a large open wound filled with packing material. The periphery of the open wound demonstrates a thick rind of soft tissue, some of which appears to enhance. In the medial aspect of the right breast (image 27 of series 2) there is a 1.5 x 1.3 cm enhancing soft tissue lesion, suspicious for a focus of residual tumor or a malignant lymph node. Numerous borderline enlarged and mildly enlarged right axillary lymph nodes are noted,  measuring up to 12 mm in short axis. There are no aggressive appearing lytic or blastic lesions noted in the visualized portions of the skeleton.  CT ABDOMEN AND PELVIS FINDINGS  Abdomen/Pelvis: Several well-defined low-attenuation lesions in the liver appear unchanged in size, number and pattern of distribution compared to prior study 2012, presumably small cysts. The largest of these lesions measures 2.1 cm in segment 4A. No other suspicious hepatic lesions are noted. Status post cholecystectomy. The appearance of  the pancreas, spleen, bilateral adrenal glands and bilateral kidneys is unremarkable.  Normal appendix (retrocecal in position). No significant volume of ascites. No pneumoperitoneum. No pathologic distention of small bowel. There are a few colonic diverticulae, particularly in the region of the sigmoid colon, without surrounding inflammatory changes to suggest an acute diverticulitis at this time. Several small ventral hernias are noted, containing several loops of small bowel and a short segment of the mid transverse colon, without evidence of bowel incarceration or obstruction at this time. No lymphadenopathy identified within the abdomen or pelvis. Status post hysterectomy. Ovaries are not confidently identified may be surgically absent or atrophic.  Musculoskeletal: There are no aggressive appearing lytic or blastic lesions noted in the visualized portions of the skeleton. Edema throughout the fat of the right flank, presumably reactive to the process in the right breast.  IMPRESSION: 1. Postoperative changes of recent right breast surgery with packed open wound which has a thick rind of soft tissue, some of which enhances, a 1.3 x 1.5 cm enhancing lesion in the medial aspect of the right breast which may represent residual tumor or a malignant lymph node, and multiple enlarged right axillary nodes concerning for axillary nodal metastases. 2. No other definite signs of metastatic disease in the chest, abdomen or pelvis. 3. Multiple small ventral hernias containing several loops of small bowel, as well as the mid transverse colon, without associated bowel incarceration or obstruction at this time. 4. Trace bilateral pleural effusions with mild dependent subsegmental atelectasis in the lower lobes of the lungs bilaterally. 5. Status post cholecystectomy and hysterectomy. 6. Additional incidental findings, as above.   Electronically Signed   By: Vinnie Langton M.D.   On: 10/26/2013 08:35     Anti-infectives: Anti-infectives   Start     Dose/Rate Route Frequency Ordered Stop   10/22/13 1215  clindamycin (CLEOCIN) IVPB 600 mg     600 mg 100 mL/hr over 30 Minutes Intravenous 4 times per day 10/22/13 1136     10/22/13 1215  vancomycin (VANCOCIN) IVPB 1000 mg/200 mL premix     1,000 mg 200 mL/hr over 60 Minutes Intravenous Every 12 hours 10/22/13 1142     10/22/13 0430  ceFEPIme (MAXIPIME) 1 g in dextrose 5 % 50 mL IVPB     1 g 100 mL/hr over 30 Minutes Intravenous  Once 10/22/13 0423 10/22/13 0552   10/22/13 0430  vancomycin (VANCOCIN) IVPB 1000 mg/200 mL premix  Status:  Discontinued     1,000 mg 200 mL/hr over 60 Minutes Intravenous  Once 10/22/13 0423 10/22/13 1142   10/22/13 0430  metroNIDAZOLE (FLAGYL) IVPB 500 mg  Status:  Discontinued     500 mg 100 mL/hr over 60 Minutes Intravenous  Once 10/22/13 0423 10/22/13 0424   10/22/13 0430  [MAR Hold]  clindamycin (CLEOCIN) IVPB 900 mg     (On MAR Hold since 10/22/13 0615)   900 mg 100 mL/hr over 30 Minutes Intravenous  Once 10/22/13 0424 10/22/13 6195  Assessment/Plan  1. 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 POD  4/2 NSTI s/p right mastectomy 3. Hypokalemia  Plan: 1. Will go back to the OR again today for further debridement and lymph node dissection given lymphatic involvement. 2. Medicine replaced K already 3. Check labs in am 4. Regular diet after OR today   LOS: 4 days    [name] 10/26/2013, 10:08 AM Pager: [phone]

## 2013-10-26 NOTE — Anesthesia Preprocedure Evaluation (Addendum)
Anesthesia Evaluation  Patient identified by MRN, date of birth, ID band Patient awake    Reviewed: Allergy & Precautions, H&P , NPO status , Patient's Chart, lab work & pertinent test results, reviewed documented beta blocker date and time   Airway Mallampati: II TM Distance: >3 FB Neck ROM: Full  Mouth opening: Limited Mouth Opening  Dental  (+) Dental Advisory Given, Poor Dentition, Edentulous Upper, Edentulous Lower, Chipped, Missing   Pulmonary  breath sounds clear to auscultation        Cardiovascular Rhythm:Regular     Neuro/Psych    GI/Hepatic negative GI ROS, Neg liver ROS,   Endo/Other  diabetes, Well Controlled, Type 1, Insulin Dependent  Renal/GU negative Renal ROS     Musculoskeletal negative musculoskeletal ROS (+)   Abdominal (+)  Abdomen: soft. Bowel sounds: normal.  Peds  Hematology  (+) anemia ,   Anesthesia Other Findings   Reproductive/Obstetrics negative OB ROS                       Anesthesia Physical Anesthesia Plan  ASA: III  Anesthesia Plan:    Post-op Pain Management:    Induction:   Airway Management Planned:   Additional Equipment:   Intra-op Plan:   Post-operative Plan:   Informed Consent:   Plan Discussed with:   Anesthesia Plan Comments:         Anesthesia Quick Evaluation

## 2013-10-27 LAB — CBC
HEMATOCRIT: 27.8 % — AB (ref 36.0–46.0)
Hemoglobin: 9.2 g/dL — ABNORMAL LOW (ref 12.0–15.0)
MCH: 28 pg (ref 26.0–34.0)
MCHC: 33.1 g/dL (ref 30.0–36.0)
MCV: 84.5 fL (ref 78.0–100.0)
Platelets: 502 10*3/uL — ABNORMAL HIGH (ref 150–400)
RBC: 3.29 MIL/uL — ABNORMAL LOW (ref 3.87–5.11)
RDW: 14.3 % (ref 11.5–15.5)
WBC: 19.2 10*3/uL — ABNORMAL HIGH (ref 4.0–10.5)

## 2013-10-27 LAB — GLUCOSE, CAPILLARY
GLUCOSE-CAPILLARY: 160 mg/dL — AB (ref 70–99)
GLUCOSE-CAPILLARY: 109 mg/dL — AB (ref 70–99)
GLUCOSE-CAPILLARY: 121 mg/dL — AB (ref 70–99)
Glucose-Capillary: 162 mg/dL — ABNORMAL HIGH (ref 70–99)
Glucose-Capillary: 185 mg/dL — ABNORMAL HIGH (ref 70–99)
Glucose-Capillary: 157 mg/dL — ABNORMAL HIGH (ref 70–99)

## 2013-10-27 LAB — TYPE AND SCREEN
ABO/RH(D): O POS
Antibody Screen: NEGATIVE
UNIT DIVISION: 0
UNIT DIVISION: 0
UNIT DIVISION: 0
Unit division: 0
Unit division: 0
Unit division: 0
Unit division: 0

## 2013-10-27 LAB — BASIC METABOLIC PANEL
BUN: 7 mg/dL (ref 6–23)
CALCIUM: 8.2 mg/dL — AB (ref 8.4–10.5)
CO2: 26 mEq/L (ref 19–32)
CREATININE: 0.67 mg/dL (ref 0.50–1.10)
Chloride: 101 mEq/L (ref 96–112)
Glucose, Bld: 155 mg/dL — ABNORMAL HIGH (ref 70–99)
POTASSIUM: 3.8 meq/L (ref 3.7–5.3)
Sodium: 140 mEq/L (ref 137–147)

## 2013-10-27 LAB — ANAEROBIC CULTURE

## 2013-10-27 MED ORDER — ENOXAPARIN SODIUM 40 MG/0.4ML ~~LOC~~ SOLN
40.0000 mg | SUBCUTANEOUS | Status: DC
  Administered 2013-10-28 – 2013-11-06 (×10): 40 mg via SUBCUTANEOUS
  Filled 2013-10-27 (×13): qty 0.4

## 2013-10-27 MED ORDER — WHITE PETROLATUM GEL
Status: AC
  Filled 2013-10-27: qty 5

## 2013-10-27 NOTE — Progress Notes (Signed)
Carmen Stone  Carmen Stone QGB:[id number] DOB: 12-Jul-1955 DOA: 10/22/2013 PCP: [name], MD  Time spent :  Brief narrative: 58 y.o. female, with history of type 2 diabetes mellitus who is on Glucophage, no other known medical problems, was admitted by general surgery service for right breast drainage due to right breast abscess, she was taken to the OR on 10/24/2013 for incision and drainage, [facility] was consulted to manage diabetes.  HPI/Subjective: Sts pain well controlled. 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 rate 24, height _0  (1.626 m), weight 164 lb 5 oz (74.532 kg), SpO2 98.00%.  Intake/Output Summary (Last 24 hours) at 10/27/13 1426 Last data filed at 10/27/13  1155  Gross per 24 hour  Intake 1800.67 ml  Output   1335 ml  Net 465.67 ml     Exam: General: No acute respiratory distress Breast: right mastectomy site drsg CDI- no RUE swelling Lungs: Clear to auscultation bilaterally without wheezes or crackles, RA Cardiovascular: Regular rate and rhythm without murmur gallop or rub normal S1 and S2, no peripheral edema or JVD Abdomen: Nontender, nondistended, soft, bowel sounds positive, no rebound, no ascites, no appreciable mass Musculoskeletal: No significant cyanosis, clubbing of bilateral lower extremities Neurological: Alert and oriented x 3, moves all extremities x 4 without focal neurological deficits, CN 2-12 intact  Scheduled Meds:  Scheduled Meds: . Chlorhexidine Gluconate Cloth  6 each Topical Q0600  . clindamycin (CLEOCIN) IV  600 mg Intravenous 4 times per day  . enoxaparin (LOVENOX) injection  40 mg Subcutaneous Q24H  . insulin aspart  0-20 Units Subcutaneous TID WC  . insulin aspart  0-5 Units Subcutaneous QHS  . insulin glargine  14 Units Subcutaneous Daily  . insulin starter kit- syringes  1 kit Other Once  . mupirocin ointment  1 application Nasal BID  . vancomycin  1,000 mg Intravenous Q12H   Continuous Infusions: . lactated ringers 20 mL/hr at 10/26/13 1615    Data Reviewed: Basic Metabolic Panel:  Recent Labs Lab 10/22/13 0238 10/24/13 1219 10/25/13 0237 10/26/13 0225 10/26/13 1720 10/27/13 0331  NA 133* 137 137 139 138 140  K 4.1 3.9 3.6* 3.5* 4.0 3.8  CL 95*  --  103 104 102 101  CO2 23  --  _1 GLUCOSE 306* 289* 171* 123* 187* 155*  BUN 9  --  _0 CREATININE 0.48*  --  0.55 0.63 0.65 0.67  CALCIUM 8.8  --  7.3* 7.9* 7.7* 8.2*   Liver Function Tests:  Recent Labs Lab 10/22/13 0238  AST 12  ALT 10  ALKPHOS 82  BILITOT 0.3  PROT 7.1  ALBUMIN 2.4*   No results found for this basename: LIPASE, AMYLASE,  in the last 168 hours No results found for this basename: AMMONIA,  in the  last 168 hours CBC:  Recent Labs Lab 10/22/13 0238  10/25/13 0237 10/25/13 0905 10/26/13 0225 10/26/13 1720 10/27/13 0331  WBC 15.9*  < > 9.9 9.1 11.4* 18.3* 19.2*  NEUTROABS 12.0*  --   --   --   --   --   --   HGB 9.6*  < > 7.6* 8.0* 9.1* 9.8* 9.2*  HCT 29.8*  < > 22.8* 24.0* 27.4* 28.6* 27.8*  MCV 83.9  < > 81.4 83.9 83.5 84.4 84.5  PLT 491*  < > 395 390 432* 438* 502*  < > = values in this interval not displayed. Cardiac Enzymes: No results found for this basename: CKTOTAL, CKMB, CKMBINDEX, TROPONINI,  in the last 168 hours BNP (last 3 results) No results found for this basename: PROBNP,  in the last 8760 hours CBG:  Recent Labs Lab 10/26/13 1522 10/26/13 1706 10/26/13 2057 10/27/13 0810 10/27/13 1152  GLUCAP 157* 183* 95 162* 185*    Recent Results (from the past 240 hour(s))  CULTURE, BLOOD (ROUTINE X 2)     Status: None   Collection Time    10/22/13  3:03 AM      Result Value Ref Range Status   Specimen Description BLOOD LEFT ANTECUBITAL   Final   Special Requests BOTTLES DRAWN AEROBIC AND ANAEROBIC 10CC EACH   Final   Culture  Setup Time     Final   Value: 10/22/2013 12:09     Performed at [facility]   Culture     Final   Value:        BLOOD CULTURE RECEIVED NO GROWTH TO DATE CULTURE WILL BE HELD FOR 5 DAYS BEFORE ISSUING A FINAL NEGATIVE REPORT     Performed at [facility]   Report Status PENDING   Incomplete  CULTURE, BLOOD (ROUTINE X 2)     Status: None   Collection Time    10/22/13  3:08 AM      Result Value Ref Range Status   Specimen Description BLOOD RIGHT ANTECUBITAL   Final   Special Requests BOTTLES DRAWN AEROBIC ONLY 10CC   Final   Culture  Setup Time     Final   Value: 10/22/2013 12:09     Performed at [facility]   Culture     Final   Value:        BLOOD CULTURE RECEIVED NO GROWTH TO DATE CULTURE WILL BE HELD FOR 5 DAYS BEFORE ISSUING A FINAL NEGATIVE REPORT     Performed at [facility]   Report  Status PENDING   Incomplete  WOUND CULTURE     Status: None   Collection Time    10/22/13  4:35 AM      Result Value Ref Range Status   Specimen Description WOUND   Final   Special Requests BREAST   Final   Gram Stain     Final   Value: MODERATE WBC PRESENT, PREDOMINANTLY PMN     RARE SQUAMOUS EPITHELIAL CELLS PRESENT  FEW GRAM NEGATIVE RODS     FEW GRAM POSITIVE RODS     MODERATE GRAM POSITIVE COCCI IN PAIRS   Culture     Final   Value: MODERATE GROUP B STREP(S.AGALACTIAE)ISOLATED     Stone: TESTING AGAINST S. AGALACTIAE NOT ROUTINELY PERFORMED DUE TO PREDICTABILITY OF AMP/PEN/VAN SUSCEPTIBILITY.     Performed at [facility]   Report Status 10/24/2013 FINAL   Final  ANAEROBIC CULTURE     Status: None   Collection Time    10/22/13  6:46 AM      Result Value Ref Range Status   Specimen Description WOUND BREAST RIGHT   Final   Special Requests NONE   Final   Gram Stain     Final   Value: ABUNDANT WBC PRESENT, PREDOMINANTLY PMN     FEW SQUAMOUS EPITHELIAL CELLS PRESENT     ABUNDANT GRAM POSITIVE COCCI IN PAIRS     IN CLUSTERS IN CHAINS FEW GRAM NEGATIVE COCCI     MODERATE GRAM NEGATIVE RODS   Culture     Final   Value: NO ANAEROBES ISOLATED     Performed at [facility]   Report Status 10/27/2013 FINAL   Final  WOUND CULTURE     Status: None   Collection Time    10/22/13  6:46 AM      Result Value Ref Range Status   Specimen Description WOUND BREAST RIGHT   Final   Special Requests NONE   Final   Gram Stain     Final   Value: MODERATE WBC PRESENT, PREDOMINANTLY PMN     RARE SQUAMOUS EPITHELIAL CELLS PRESENT     ABUNDANT GRAM POSITIVE COCCI IN PAIRS     IN CHAINS FEW GRAM POSITIVE RODS     FEW GRAM NEGATIVE RODS   Culture     Final   Value: MODERATE GROUP B STREP(S.AGALACTIAE)ISOLATED     Stone: TESTING AGAINST S. AGALACTIAE NOT ROUTINELY PERFORMED DUE TO PREDICTABILITY OF AMP/PEN/VAN SUSCEPTIBILITY.     Performed at [facility]   Report Status  10/25/2013 FINAL   Final  SURGICAL PCR SCREEN     Status: Abnormal   Collection Time    10/24/13  8:51 AM      Result Value Ref Range Status   MRSA, PCR POSITIVE (*) NEGATIVE Final   Staphylococcus aureus POSITIVE (*) NEGATIVE Final   Comment:            The Xpert SA Assay (FDA     approved for NASAL specimens     in patients over 59 years of age),     is one component of     a comprehensive surveillance     program.  Test performance has     been validated by [employer] for patients greater     than or equal to 45 year old.     It is not intended     to diagnose infection nor to     guide or monitor treatment.  URINE CULTURE     Status: None   Collection Time    10/25/13  7:41 PM      Result Value Ref Range Status   Specimen Description URINE, RANDOM   Final   Special Requests NONE   Final   Culture  Setup Time     Final   Value: 10/26/2013 00:49     Performed at [place]  Final   Value: 30,000 COLONIES/ML     Performed at [facility]   Culture     Final   Value: Multiple bacterial morphotypes present, none predominant. Suggest appropriate recollection if clinically indicated.     Performed at [facility]   Report Status 10/26/2013 FINAL   Final     Studies:  Recent x-ray studies have been reviewed in detail by the Attending Physician       Erin Hearing, ANP [facility] Office  [phone] Pager [phone]  **Disclaimer: This Stone may have been dictated with voice recognition software. Similar sounding words can inadvertently be transcribed and this Stone may contain transcription errors which may not have been corrected upon publication of Stone.**   **If unable to reach the above provider after paging please contact the Flow Manager @ [phone]  On-Call/Text Page:      [email or web address]      password [email or web address]  If 7PM-7AM, please contact night-coverage [email or web address] Password [email or web address] 10/27/2013, 2:26 PM    LOS: 5 days    Examined patient and discussed assessment and plan with ANP Ebony Hail and agree with plan.  Plan discussed with patient,all questions answered  Greater than 35 minutes was direct patient care

## 2013-10-27 NOTE — Progress Notes (Signed)
1 Day Post-Op  Subjective: Feels fine, no complaints, discussed surgery  Objective: Vital signs in last 24 hours: Temp:  [97.5 F (36.4 C)-99.7 F (37.6 C)] 99.7 F (37.6 C) (05/14 0806) Pulse Rate:  [81-105] 87 (05/14 0800) Resp:  [10-29] 17 (05/14 0800) BP: (95-157)/(30-83) 106/30 mmHg (05/14 0800) SpO2:  [95 %-100 %] 96 % (05/14 0800) Last BM Date: 10/26/13  Intake/Output from previous day: 05/13 0701 - 05/14 0700 In: 2840.7 [P.O.:400; I.V.:1225.7; Blood:335; IV Piggyback:750] Out: 42 [Urine:800; Drains:135; Blood:550] Intake/Output this shift: Total I/O In: 20 [I.V.:20] Out: -   General appearance: no distress Resp: clear to auscultation bilaterally Cardio: regular rate and rhythm Incision/Wound:skin viable and intact, flaps viable, drains with expected output as of now  Lab Results:   Recent Labs  10/26/13 1720 10/27/13 0331  WBC 18.3* 19.2*  HGB 9.8* 9.2*  HCT 28.6* 27.8*  PLT 438* 502*   BMET  Recent Labs  10/26/13 1720 10/27/13 0331  NA 138 140  K 4.0 3.8  CL 102 101  CO2 22 26  GLUCOSE 187* 155*  BUN 7 7  CREATININE 0.65 0.67  CALCIUM 7.7* 8.2*   PT/INR  Recent Labs  10/24/13 1215 10/24/13 1520  LABPROT 13.4 14.3  INR 1.04 1.13   ABG No results found for this basename: PHART, PCO2, PO2, HCO3,  in the last 72 hours  Studies/Results: Ct Chest W Contrast  10/26/2013   CLINICAL DATA:  58 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 Evaluate for metastatic disease.  EXAM: CT CHEST, ABDOMEN, AND PELVIS WITH CONTRAST  TECHNIQUE: Multidetector CT imaging of the chest, abdomen and pelvis was performed following the standard protocol during bolus administration of intravenous contrast.  CONTRAST:  1107mL OMNIPAQUE IOHEXOL 300 MG/ML  SOLN  COMPARISON:  CT of abdomen pelvis 06/21/2010.  FINDINGS: CT CHEST FINDINGS  Mediastinum: Heart size is normal. There is no significant pericardial fluid, thickening or pericardial calcification. No  pathologically enlarged mediastinal, bilateral hilar or internal mammary lymph nodes. Esophagus is unremarkable in appearance.  Lungs/Pleura: Trace bilateral pleural effusions layering dependently with some mild dependent subsegmental atelectasis in the lower lobes of the lungs bilaterally. No acute consolidative airspace disease. No suspicious appearing pulmonary nodules or masses.  Musculoskeletal: Postoperative changes in the right breast are noted, with a large open wound filled with packing material. The periphery of the open wound demonstrates a thick rind of soft tissue, some of which appears to enhance. In the medial aspect of the right breast (image 27 of series 2) there is a 1.5 x 1.3 cm enhancing soft tissue lesion, suspicious for a focus of residual tumor or a malignant lymph node. Numerous borderline enlarged and mildly enlarged right axillary lymph nodes are noted, measuring up to 12 mm in short axis. There are no aggressive appearing lytic or blastic lesions noted in the visualized portions of the skeleton.  CT ABDOMEN AND PELVIS FINDINGS  Abdomen/Pelvis: Several well-defined low-attenuation lesions in the liver appear unchanged in size, number and pattern of distribution compared to prior study 2012, presumably small cysts. The largest of these lesions measures 2.1 cm in segment 4A. No other suspicious hepatic lesions are noted. Status post cholecystectomy. The appearance of the pancreas, spleen, bilateral adrenal glands and bilateral kidneys is unremarkable.  Normal appendix (retrocecal in position). No significant volume of ascites. No pneumoperitoneum. No pathologic distention of small bowel. There are a few colonic diverticulae, particularly in the region of the sigmoid colon, without surrounding inflammatory changes to suggest  an acute diverticulitis at this time. Several small ventral hernias are noted, containing several loops of small bowel and a short segment of the mid transverse colon,  without evidence of bowel incarceration or obstruction at this time. No lymphadenopathy identified within the abdomen or pelvis. Status post hysterectomy. Ovaries are not confidently identified may be surgically absent or atrophic.  Musculoskeletal: There are no aggressive appearing lytic or blastic lesions noted in the visualized portions of the skeleton. Edema throughout the fat of the right flank, presumably reactive to the process in the right breast.  IMPRESSION: 1. Postoperative changes of recent right breast surgery with packed open wound which has a thick rind of soft tissue, some of which enhances, a 1.3 x 1.5 cm enhancing lesion in the medial aspect of the right breast which may represent residual tumor or a malignant lymph node, and multiple enlarged right axillary nodes concerning for axillary nodal metastases. 2. No other definite signs of metastatic disease in the chest, abdomen or pelvis. 3. Multiple small ventral hernias containing several loops of small bowel, as well as the mid transverse colon, without associated bowel incarceration or obstruction at this time. 4. Trace bilateral pleural effusions with mild dependent subsegmental atelectasis in the lower lobes of the lungs bilaterally. 5. Status post cholecystectomy and hysterectomy. 6. Additional incidental findings, as above.   Electronically Signed   By: Vinnie Langton M.D.   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 Evaluate for metastatic disease.  EXAM: CT CHEST, ABDOMEN, AND PELVIS WITH CONTRAST  TECHNIQUE: Multidetector CT imaging of the chest, abdomen and pelvis was performed following the standard protocol during bolus administration of intravenous contrast.  CONTRAST:  116mL OMNIPAQUE IOHEXOL 300 MG/ML  SOLN  COMPARISON:  CT of abdomen pelvis 06/21/2010.  FINDINGS: CT CHEST FINDINGS  Mediastinum: Heart size is normal. There is no  significant pericardial fluid, thickening or pericardial calcification. No pathologically enlarged mediastinal, bilateral hilar or internal mammary lymph nodes. Esophagus is unremarkable in appearance.  Lungs/Pleura: Trace bilateral pleural effusions layering dependently with some mild dependent subsegmental atelectasis in the lower lobes of the lungs bilaterally. No acute consolidative airspace disease. No suspicious appearing pulmonary nodules or masses.  Musculoskeletal: Postoperative changes in the right breast are noted, with a large open wound filled with packing material. The periphery of the open wound demonstrates a thick rind of soft tissue, some of which appears to enhance. In the medial aspect of the right breast (image 27 of series 2) there is a 1.5 x 1.3 cm enhancing soft tissue lesion, suspicious for a focus of residual tumor or a malignant lymph node. Numerous borderline enlarged and mildly enlarged right axillary lymph nodes are noted, measuring up to 12 mm in short axis. There are no aggressive appearing lytic or blastic lesions noted in the visualized portions of the skeleton.  CT ABDOMEN AND PELVIS FINDINGS  Abdomen/Pelvis: Several well-defined low-attenuation lesions in the liver appear unchanged in size, number and pattern of distribution compared to prior study 2012, presumably small cysts. The largest of these lesions measures 2.1 cm in segment 4A. No other suspicious hepatic lesions are noted. Status post cholecystectomy. The appearance of the pancreas, spleen, bilateral adrenal glands and bilateral kidneys is unremarkable.  Normal appendix (retrocecal in position). No significant volume of ascites. No pneumoperitoneum. No pathologic distention of small bowel. There are a few colonic diverticulae, particularly in the region  of the sigmoid colon, without surrounding inflammatory changes to suggest an acute diverticulitis at this time. Several small ventral hernias are noted, containing  several loops of small bowel and a short segment of the mid transverse colon, without evidence of bowel incarceration or obstruction at this time. No lymphadenopathy identified within the abdomen or pelvis. Status post hysterectomy. Ovaries are not confidently identified may be surgically absent or atrophic.  Musculoskeletal: There are no aggressive appearing lytic or blastic lesions noted in the visualized portions of the skeleton. Edema throughout the fat of the right flank, presumably reactive to the process in the right breast.  IMPRESSION: 1. Postoperative changes of recent right breast surgery with packed open wound which has a thick rind of soft tissue, some of which enhances, a 1.3 x 1.5 cm enhancing lesion in the medial aspect of the right breast which may represent residual tumor or a malignant lymph node, and multiple enlarged right axillary nodes concerning for axillary nodal metastases. 2. No other definite signs of metastatic disease in the chest, abdomen or pelvis. 3. Multiple small ventral hernias containing several loops of small bowel, as well as the mid transverse colon, without associated bowel incarceration or obstruction at this time. 4. Trace bilateral pleural effusions with mild dependent subsegmental atelectasis in the lower lobes of the lungs bilaterally. 5. Status post cholecystectomy and hysterectomy. 6. Additional incidental findings, as above.   Electronically Signed   By: Vinnie Langton M.D.   On: 10/26/2013 08:35    Anti-infectives: Anti-infectives   Start     Dose/Rate Route Frequency Ordered Stop   10/22/13 1215  clindamycin (CLEOCIN) IVPB 600 mg     600 mg 100 mL/hr over 30 Minutes Intravenous 4 times per day 10/22/13 1136     10/22/13 1215  vancomycin (VANCOCIN) IVPB 1000 mg/200 mL premix     1,000 mg 200 mL/hr over 60 Minutes Intravenous Every 12 hours 10/22/13 1142     10/22/13 0430  ceFEPIme (MAXIPIME) 1 g in dextrose 5 % 50 mL IVPB     1 g 100 mL/hr over 30  Minutes Intravenous  Once 10/22/13 0423 10/22/13 0552   10/22/13 0430  vancomycin (VANCOCIN) IVPB 1000 mg/200 mL premix  Status:  Discontinued     1,000 mg 200 mL/hr over 60 Minutes Intravenous  Once 10/22/13 0423 10/22/13 1142   10/22/13 0430  metroNIDAZOLE (FLAGYL) IVPB 500 mg  Status:  Discontinued     500 mg 100 mL/hr over 60 Minutes Intravenous  Once 10/22/13 0423 10/22/13 0424   10/22/13 0430  [MAR Hold]  clindamycin (CLEOCIN) IVPB 900 mg     (On MAR Hold since 10/22/13 0615)   900 mg 100 mL/hr over 30 Minutes Intravenous  Once 10/22/13 0424 10/22/13 9735      Assessment/Plan: POD 5/3/1 for what is eventual right 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 Po pain meds with iv backup 2. Reg diabetic diet 3. Glucose better controlled 4. Cont vanc/clinda, await final culture results  5. Will need to monitor wound she certainly is high risk for infection or wound dehiscence 6. Will start lovenox tomorro 7. oob with pt 8. Prognostic panel pending, I have discussed with med onc.  Eventually will need to see them and get chemotherapy for inflammatory breast cancer.  Will need port but don't want to do that with active infection.  [name] 10/27/2013

## 2013-10-27 NOTE — Progress Notes (Signed)
She is stable from a medical standpoint to transfer to surgical floor- defer to the primary surgical team timing of transfer  Erin Hearing, ANP   Examined patient and discussed assessment and plan with ANP Ebony Hail and agree with plan.

## 2013-10-28 ENCOUNTER — Encounter (HOSPITAL_COMMUNITY): Payer: Self-pay | Admitting: General Surgery

## 2013-10-28 LAB — GLUCOSE, CAPILLARY
Glucose-Capillary: 121 mg/dL — ABNORMAL HIGH (ref 70–99)
Glucose-Capillary: 136 mg/dL — ABNORMAL HIGH (ref 70–99)
Glucose-Capillary: 109 mg/dL — ABNORMAL HIGH (ref 70–99)
Glucose-Capillary: 159 mg/dL — ABNORMAL HIGH (ref 70–99)
Glucose-Capillary: 163 mg/dL — ABNORMAL HIGH (ref 70–99)
Glucose-Capillary: 122 mg/dL — ABNORMAL HIGH (ref 70–99)

## 2013-10-28 LAB — CBC
HEMATOCRIT: 26.4 % — AB (ref 36.0–46.0)
HEMOGLOBIN: 8.9 g/dL — AB (ref 12.0–15.0)
MCH: 29.3 pg (ref 26.0–34.0)
MCHC: 33.7 g/dL (ref 30.0–36.0)
MCV: 86.8 fL (ref 78.0–100.0)
PLATELETS: 582 10*3/uL — AB (ref 150–400)
RBC: 3.04 MIL/uL — ABNORMAL LOW (ref 3.87–5.11)
RDW: 14.6 % (ref 11.5–15.5)
WBC: 16.4 10*3/uL — ABNORMAL HIGH (ref 4.0–10.5)

## 2013-10-28 LAB — CULTURE, BLOOD (ROUTINE X 2)
CULTURE: NO GROWTH
Culture: NO GROWTH

## 2013-10-28 LAB — BASIC METABOLIC PANEL
BUN: 11 mg/dL (ref 6–23)
CALCIUM: 8.4 mg/dL (ref 8.4–10.5)
CO2: 27 mEq/L (ref 19–32)
Chloride: 98 mEq/L (ref 96–112)
Creatinine, Ser: 0.97 mg/dL (ref 0.50–1.10)
GFR calc Af Amer: 73 mL/min — ABNORMAL LOW (ref >90)
GFR calc non Af Amer: 63 mL/min — ABNORMAL LOW (ref >90)
GLUCOSE: 119 mg/dL — AB (ref 70–99)
Potassium: 3.8 mEq/L (ref 3.7–5.3)
SODIUM: 138 meq/L (ref 137–147)

## 2013-10-28 MED ORDER — LIVING WELL WITH DIABETES BOOK
Freq: Once | Status: AC
  Administered 2013-10-28: 16:00:00
  Filled 2013-10-28 (×3): qty 1

## 2013-10-28 MED ORDER — INSULIN STARTER KIT- SYRINGES (ENGLISH)
1.0000 | Freq: Once | Status: AC
  Administered 2013-10-28: 1
  Filled 2013-10-28 (×2): qty 1

## 2013-10-28 NOTE — Evaluation (Signed)
Physical Therapy Evaluation and Discharge  Patient Details Name: Carmen Stone MRN: [id number] DOB: Feb 21, 1956 Today's Date: 10/28/2013   History of Present Illness  s/p mastectomy of Rt axillary dissection to remove Rt breast abscess.  Clinical Impression  Pt s/p surgery listed above. Pt at baseline for mobility at this time. Pt challenged with high level balance activities; pt guarded and demo good safety awareness; no LOB noted. Pt to benefit form OPPT to address ROM limitations in Rt UE. Encouraged pt to ambulate unit as tolerated with family to promote mobility. Will sign off at this time.     Follow Up Recommendations Outpatient PT;Supervision - Intermittent;Other (comment) (for Rt UE ROM and high level balance )    Equipment Recommendations  None recommended by PT    Recommendations for Other Services       Precautions / Restrictions Precautions Precautions: None Restrictions Weight Bearing Restrictions: No      Mobility  Bed Mobility               General bed mobility comments: not addressed; pt up in chair and returned to chair   Transfers Overall transfer level: Modified independent Equipment used: Straight cane             General transfer comment: no LOB noted; pt relies on cane in Lt UE for support with transfers   Ambulation/Gait Ambulation/Gait assistance: Modified independent (Device/Increase time) Ambulation Distance (Feet): 250 Feet Assistive device: Straight cane Gait Pattern/deviations: Decreased stride length;Step-through pattern;Wide base of support Gait velocity: decreased; was able to minimally increase   General Gait Details: pt challenged with high level balance activities; no LOB noted; fatigued with gt due to deconditioning after surgery; pt very motivated; safe to ambulate with family as tolerated   Stairs Stairs: Yes Stairs assistance: Supervision Stair Management: One rail Right;With cane;Step to pattern;Forwards Number  of Stairs: 3 General stair comments: supervision for safety only; no LOB noted; support of cane in Lt UE and handrail in Rt   Wheelchair Mobility    Modified Rankin (Stroke Patients Only)       Balance Overall balance assessment: Modified Independent                           High level balance activites: Direction changes;Sudden stops;Head turns (gt velocity changes ) High Level Balance Comments: no LOB noted; pt was guarded and demo good safety awareness with challenges               Pertinent Vitals/Pain Denies any pain at this time.     Home Living Family/patient expects to be discharged to:: Private residence Living Arrangements: Children Available Help at Discharge: Family;Available PRN/intermittently Type of Home: House Home Access: Stairs to enter Entrance Stairs-Rails: Right;Left;Can reach both Entrance Stairs-Number of Steps: 4 Home Layout: Two level;Bed/bath upstairs Home Equipment: Cane - single point Additional Comments: pt has tub shower     Prior Function Level of Independence: Independent with assistive device(s)         Comments: pt has SPC for ambulation; continues to drive and work      Hand Dominance   Dominant Hand: Right    Extremity/Trunk Assessment   Upper Extremity Assessment: RUE deficits/detail RUE Deficits / Details: Rt shoulder flexion and abduction limited due to surgery; flexion grossly 90; abduction grossly 100 limited by pain and tissue  RUE: Unable to fully assess due to pain       Lower Extremity  Assessment: Overall WFL for tasks assessed      Cervical / Trunk Assessment: Normal  Communication   Communication: No difficulties  Cognition Arousal/Alertness: Awake/alert Behavior During Therapy: WFL for tasks assessed/performed Overall Cognitive Status: Within Functional Limits for tasks assessed                      General Comments General comments (skin integrity, edema, etc.): encouraged to  ambulate daily as tolerated with family     Exercises        Assessment/Plan    PT Assessment Patent does not need any further PT services  PT Diagnosis     PT Problem List    PT Treatment Interventions     PT Goals (Current goals can be found in the Care Plan section) Acute Rehab PT Goals Patient Stated Goal: to go home today  PT Goal Formulation: No goals set, d/c therapy    Frequency     Barriers to discharge        Co-evaluation               End of Session Equipment Utilized During Treatment: Gait belt Activity Tolerance: Patient tolerated treatment well Patient left: in chair;with call bell/phone within reach;with family/visitor present Nurse Communication: Mobility status         Time: 6226-3335 PT Time Calculation (min): 16 min   Charges:   PT Evaluation $Initial PT Evaluation Tier I: 1 Procedure PT Treatments $Gait Training: 8-22 mins   PT G CodesKennis Carina [place], [place]   [phone] 10/28/2013, 10:44 AM

## 2013-10-28 NOTE — Progress Notes (Signed)
Patient ID: Carmen Stone, female   DOB: July 02, 1955, 58 y.o.   MRN: [id number]  [facility] PROGRESS NOTE  Carmen Stone VXB:[id number] DOB: 02-Jun-1956 DOA: 10/22/2013 PCP: [name], MD  Brief narrative: 58 y.o. female, with history of type 2 diabetes mellitus who is on Glucophage, no other known medical problems, was admitted by general surgery service for right breast drainage due to right breast abscess, she was taken to the OR on 10/24/2013 for incision and drainage, [facility] was consulted to manage diabetes.   Assessment/Plan:  DIABETES MELLITUS, TYPE II  - moderate control in setting of necrotizing infection  - cont current Lantus and SSI  - poor control pre admit: HgbA1c 11.8  - appreciate diabetic educator input  - will follow up on recommendations  Acute respiratory failure with hypoxia  - CXR unremarkable  - resolved Postoperative anemia due to acute blood loss  - baseline hgb ~9.6  - transfused 2 units on 5/12 since SBP 90s and c/w sx's-5/13 Hgb up to 9.8 --> 9.2 --> 8.9 this AM - repeat CBC in AM Abnormal urinalysis  - appears c/w UTI  - cx pending- obtained after anbx's started  Breast abscess of female/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   Objective: Filed Vitals:   10/28/13 0156 10/28/13 0600 10/28/13 1333 10/28/13 1444  BP: 93/45 106/48 105/47   Pulse: 93 93 92   Temp: 99 F (37.2 C) 99 F (37.2 C) 99.7  F (37.6 C) 99.1 F (37.3 C)  TempSrc: Oral Oral Oral   Resp: 18 18 18    Height:      Weight:      SpO2: 96% 100% 100%     Intake/Output Summary (Last 24 hours) at 10/28/13 1713 Last data filed at 10/28/13 1646  Gross per 24 hour  Intake 1255.33 ml  Output     75 ml  Net 1180.33 ml    Exam:   General:  Pt is alert, follows commands appropriately, not in acute distress  Cardiovascular: Regular rate and rhythm, S1/S2, no murmurs, no rubs, no gallops  Respiratory: Clear to auscultation bilaterally, no wheezing, no crackles, no rhonchi  Abdomen: Soft, non tender, non distended, bowel sounds present, no guarding  Data Reviewed: Basic Metabolic Panel:  Recent Labs Lab 10/25/13 0237 10/26/13 0225 10/26/13 1720 10/27/13 0331 10/28/13 0350  NA 137 139 138 140 138  K 3.6* 3.5* 4.0 3.8 3.8  CL 103 104 102 101 98  CO2 25 26 22 26 27   GLUCOSE 171* 123* 187* 155* 119*  BUN 6 9 7 7 11   CREATININE 0.55 0.63 0.65 0.67 0.97  CALCIUM 7.3* 7.9* 7.7* 8.2* 8.4   Liver Function Tests:  Recent Labs Lab 10/22/13 0238  AST 12  ALT 10  ALKPHOS 82  BILITOT 0.3  PROT 7.1  ALBUMIN 2.4*   CBC:  Recent Labs Lab 10/22/13 0238  10/25/13 0905 10/26/13 0225 10/26/13 1720 10/27/13 0331  10/28/13 0350  WBC 15.9*  < > 9.1 11.4* 18.3* 19.2* 16.4*  NEUTROABS 12.0*  --   --   --   --   --   --   HGB 9.6*  < > 8.0* 9.1* 9.8* 9.2* 8.9*  HCT 29.8*  < > 24.0* 27.4* 28.6* 27.8* 26.4*  MCV 83.9  < > 83.9 83.5 84.4 84.5 86.8  PLT 491*  < > 390 432* 438* 502* 582*  < > = values in this interval not displayed.  CBG:  Recent Labs Lab 10/27/13 2211 10/28/13 0725 10/28/13 1146 10/28/13 1321 10/28/13 1645  GLUCAP 109* 121* 136* 109* 159*    Recent Results (from the past 240 hour(s))  CULTURE, BLOOD (ROUTINE X 2)     Status: None   Collection Time    10/22/13  3:03 AM      Result Value Ref Range Status   Specimen Description BLOOD LEFT ANTECUBITAL   Final   Special Requests  BOTTLES DRAWN AEROBIC AND ANAEROBIC 10CC EACH   Final   Culture  Setup Time     Final   Value: 10/22/2013 12:09     Performed at [facility]   Culture     Final   Value: NO GROWTH 5 DAYS     Performed at [facility]   Report Status 10/28/2013 FINAL   Final  CULTURE, BLOOD (ROUTINE X 2)     Status: None   Collection Time    10/22/13  3:08 AM      Result Value Ref Range Status   Specimen Description BLOOD RIGHT ANTECUBITAL   Final   Special Requests BOTTLES DRAWN AEROBIC ONLY 10CC   Final   Culture  Setup Time     Final   Value: 10/22/2013 12:09     Performed at [facility]   Culture     Final   Value: NO GROWTH 5 DAYS     Performed at [facility]   Report Status 10/28/2013 FINAL   Final  WOUND CULTURE     Status: None   Collection Time    10/22/13  4:35 AM      Result Value Ref Range Status   Specimen Description WOUND   Final   Special Requests BREAST   Final   Gram Stain     Final   Value: MODERATE WBC PRESENT, PREDOMINANTLY PMN     RARE SQUAMOUS EPITHELIAL CELLS PRESENT     FEW GRAM NEGATIVE RODS     FEW GRAM POSITIVE RODS     MODERATE GRAM POSITIVE COCCI IN PAIRS   Culture     Final   Value: MODERATE GROUP B STREP(S.AGALACTIAE)ISOLATED     Note: TESTING AGAINST S. AGALACTIAE NOT ROUTINELY PERFORMED DUE TO PREDICTABILITY OF AMP/PEN/VAN SUSCEPTIBILITY.     Performed at [facility]   Report Status 10/24/2013 FINAL   Final  ANAEROBIC CULTURE     Status: None   Collection Time    10/22/13  6:46 AM      Result Value Ref Range Status   Specimen Description WOUND BREAST RIGHT   Final   Special Requests NONE   Final   Gram Stain     Final   Value: ABUNDANT WBC PRESENT, PREDOMINANTLY PMN     FEW SQUAMOUS EPITHELIAL CELLS PRESENT     ABUNDANT GRAM POSITIVE COCCI IN PAIRS     IN CLUSTERS IN CHAINS FEW GRAM NEGATIVE COCCI     MODERATE  GRAM NEGATIVE RODS   Culture     Final   Value: NO ANAEROBES ISOLATED     Performed at [facility]   Report Status 10/27/2013 FINAL   Final  WOUND CULTURE     Status: None   Collection Time    10/22/13  6:46 AM      Result Value Ref Range Status   Specimen Description WOUND BREAST RIGHT   Final   Special Requests NONE   Final   Gram Stain     Final   Value: MODERATE WBC PRESENT, PREDOMINANTLY PMN     RARE SQUAMOUS EPITHELIAL CELLS PRESENT     ABUNDANT GRAM POSITIVE COCCI IN PAIRS     IN CHAINS FEW GRAM POSITIVE RODS     FEW GRAM NEGATIVE RODS   Culture     Final   Value: MODERATE GROUP B STREP(S.AGALACTIAE)ISOLATED     Note: TESTING AGAINST S. AGALACTIAE NOT ROUTINELY PERFORMED DUE TO PREDICTABILITY OF AMP/PEN/VAN SUSCEPTIBILITY.     Performed at [facility]   Report Status 10/25/2013 FINAL   Final  SURGICAL PCR SCREEN     Status: Abnormal   Collection Time    10/24/13  8:51 AM      Result Value Ref Range Status   MRSA, PCR POSITIVE (*) NEGATIVE Final   Staphylococcus aureus POSITIVE (*) NEGATIVE Final   Comment:            The Xpert SA Assay (FDA     approved for NASAL specimens     in patients over 83 years of age),     is one component of     a comprehensive surveillance     program.  Test performance has     been validated by [employer] for patients greater     than or equal to 65 year old.     It is not intended     to diagnose infection nor to     guide or monitor treatment.  URINE CULTURE     Status: None   Collection Time    10/25/13  7:41 PM      Result Value Ref Range Status   Specimen Description URINE, RANDOM   Final   Special Requests NONE   Final   Culture  Setup Time     Final   Value: 10/26/2013 00:49     Performed at [place]     Final   Value: 30,000 COLONIES/ML     Performed at [facility]   Culture     Final   Value: Multiple bacterial morphotypes present, none predominant. Suggest appropriate recollection if clinically indicated.     Performed at [facility]   Report  Status 10/26/2013 FINAL   Final     Scheduled Meds: . Chlorhexidine Gluconate Cloth  6 each Topical Q0600  . clindamycin (CLEOCIN) IV  600 mg Intravenous 4 times per day  . enoxaparin (LOVENOX) injection  40 mg Subcutaneous Q24H  . insulin aspart  0-20 Units Subcutaneous TID WC  . insulin aspart  0-5 Units Subcutaneous QHS  . insulin glargine  14 Units Subcutaneous Daily  . mupirocin ointment  1 application Nasal BID  . vancomycin  1,000 mg Intravenous Q12H   Continuous Infusions: . lactated ringers 20 mL/hr at 10/28/13 [place], MD  [facility] Pager [phone]  If 7PM-7AM, please contact night-coverage  [email or web address] Password [email or web address] 10/28/2013, 5:13 PM   LOS: 6 days

## 2013-10-28 NOTE — Progress Notes (Signed)
Diabetes booklet and kit given to pt.  I began explaining to the pt. How she will check her blood sugar and administer insulin.  Symptoms of hypoglycemia/hyperglycemia were also explained to pt. And how to treat both.  Will allow pt. To administer her insulin tonight with dinner.  Will continue to further educate pt. Carmen Stone

## 2013-10-28 NOTE — Progress Notes (Addendum)
Inpatient Diabetes Program Recommendations  AACE/ADA: New Consensus Statement on Inpatient Glycemic Control (2013)  Target Ranges:  Prepandial:   less than 140 mg/dL      Peak postprandial:   less than 180 mg/dL (1-2 hours)      Critically ill patients:  140 - 180 mg/dL     Results for TONEE, SILVERSTEIN (MRN [id number]) as of 10/28/2013 11:30  Ref. Range 10/27/2013 08:10 10/27/2013 11:52 10/27/2013 15:22 10/27/2013 17:52 10/27/2013 21:45  Glucose-Capillary Latest Range: 70-99 mg/dL 162 (H) 185 (H) 157 (H) 160 (H) 121 (H)    Results for ZAILEE, VALLELY (MRN [id number]) as of 10/28/2013 11:30  Ref. Range 10/24/2013 06:25  Hemoglobin A1C Latest Range: <5.7 % 11.8 (H)     CBGs well controlled on current regimen of Lantus 14 units daily in the AM and Novolog Resistant SSI.  A1c shows exteremly poor glucose control at home prior to admission.  Spoke with pt about her DM.  Discussed A1C results with her and her daughter and explained what an A1C is, basic pathophysiology of DM Type 2, basic home care, importance of checking CBGs and maintaining good CBG control to prevent long-term and short-term complications.  Reviewed signs and symptoms of hyperglycemia and hypoglycemia.  RNs to provide ongoing basic DM education at bedside with this patient.  Have ordered educational booklet, insulin starter kit, and DM videos.  Patient told me she was diagnosed with DM about a year ago in the hospital.  Martin Majestic to Outpatient DM Education classes but never followed up with a PCP.  Currently does not have a PCP.  Have placed care management consult for this patient for assistance with getting her a PCP after d/c.  Patient told me she takes her Metformin every day but does not check her CBGs at home b/c she does not have a CBG meter.  Will ask MD to please give pt a Rx for CBG meter at time of d/c.   MD- Patient may do well with Lantus and Metformin at home.  If oral DM agent needed to help with meal coverage at  home, could try adding a DPP-4 inhibitor like Januvia 50 mg once daily for home.  MD- Please make sure to give patient the following Rxs at time of D/C:  1. Blood glucose meter [Code # 38706] 2. Blood glucose meter strips [Code # 58260] 3. Lancets miscellaneous [Code # 8883] 5. Lantus insulin vial [Code # 84465] 5. Insulin syringes disposable 0.88m [Code # 120761] 9 Metformin 500 mg bid 7. Januvia 50 mg daily (if desired at time of d/c)    Will follow JWyn QuakerRN, MSN, CDE Diabetes Coordinator Inpatient Diabetes Program Team Pager: [phone](8a-10p)

## 2013-10-28 NOTE — Progress Notes (Signed)
Agree with above, cont iv abx until wbc normal then will need to be sent home on 10d oral abx, may start lovenox today Discussed path to be TNBC today, have sent message to Dr Jana Hakim of oncology to see if he would be able to stop by and see as outpt. Leave drains and I will take out in office eventually Once infection cleared will place port for chemo

## 2013-10-28 NOTE — Care Management Note (Addendum)
    Page 1 of 2   11/01/2013     11:14:32 AM CARE MANAGEMENT NOTE 11/01/2013  Patient:  Carmen Stone, Carmen Stone   Account Number:  [id number]  Date Initiated:  10/26/2013  Documentation initiated by:  Elissa Hefty  Subjective/Objective Assessment:   adm w breast abscess     Action/Plan:   lives w fam, pcp dr Ollen Gross [place]   Anticipated DC Date:     Anticipated DC Plan:  [place]         Choice offered to / List presented to:          [facility] arranged  HH-1 RN      [place].   Status of service:  Completed, signed off Medicare Important Message given?   (If response is "NO", the following Medicare IM given date fields will be blank) Date Medicare IM given:   Date Additional Medicare IM given:    Discharge Disposition:    Per UR Regulation:  Reviewed for med. necessity/level of care/duration of stay  If discussed at [place] of Stay Meetings, dates discussed:   10/27/2013  11/01/2013    Comments:  11-01-13 Confirmed face sheet information with patient . Magdalen Spatz RN BSN [phone]   10-31-13 Consult for  Glucometer. Patient will have to purchase  Glucometer at a pharmacy , medical equipment store or [place] . Magdalen Spatz RN BSN    10-28-13 Consult for PCP . Confirmed with patient that she does have BCBS . Explained to patient she can call number on her insurance card to receive a list of MD's in network .  Provided patient with [facility] number to call if she wants a  MD. Also , if patient knows of a MD that she in considering she can call that MD office directly to see if he / she is accepting new patients with [facility] . Also provided patient with [facility] information. Magdalen Spatz RN BSN [phone]

## 2013-10-28 NOTE — Progress Notes (Signed)
2 Days Post-Op  Subjective: Pt doing well, doesn't complain of much pain.  No N/V.  Appetite low.  Ambulated in the room only.  Tolerating some DM diet.  No N/V.  Daughter at bedside.  Objective: Vital signs in last 24 hours: Temp:  [98.1 F (36.7 C)-99.7 F (37.6 C)] 99 F (37.2 C) (05/15 0600) Pulse Rate:  [87-101] 93 (05/15 0600) Resp:  [17-24] 18 (05/15 0600) BP: (93-118)/(30-82) 106/48 mmHg (05/15 0600) SpO2:  [95 %-100 %] 100 % (05/15 0600) Last BM Date: 10/26/13  Intake/Output from previous day: 05/14 0701 - 05/15 0700 In: 945 [P.O.:480; I.V.:20; IV Piggyback:250] Out: 775 [Urine:700; Drains:75] Intake/Output this shift:    PE: Gen:  Alert, NAD, pleasant Card:  RRR, no M/G/R heard Pulm:  CTA, no W/R/R Right breast:  Incision with serosanguinous drainage and intact, no dehiscence, darkened areas at the inferior and lateral aspect of the incisions, minimal tenderness to palpation, drains with 68mL/output 24 hours Abd: Soft, NT/ND, +BS, no HSM Psych:  In good spirits, A&Ox 3   Lab Results:   Recent Labs  10/27/13 0331 10/28/13 0350  WBC 19.2* 16.4*  HGB 9.2* 8.9*  HCT 27.8* 26.4*  PLT 502* 582*   BMET  Recent Labs  10/27/13 0331 10/28/13 0350  NA 140 138  K 3.8 3.8  CL 101 98  CO2 26 27  GLUCOSE 155* 119*  BUN 7 11  CREATININE 0.67 0.97  CALCIUM 8.2* 8.4   PT/INR No results found for this basename: LABPROT, INR,  in the last 72 hours CMP     Component Value Date/Time   NA 138 10/28/2013 0350   K 3.8 10/28/2013 0350   CL 98 10/28/2013 0350   CO2 27 10/28/2013 0350   GLUCOSE 119* 10/28/2013 0350   BUN 11 10/28/2013 0350   CREATININE 0.97 10/28/2013 0350   CALCIUM 8.4 10/28/2013 0350   PROT 7.1 10/22/2013 0238   ALBUMIN 2.4* 10/22/2013 0238   AST 12 10/22/2013 0238   ALT 10 10/22/2013 0238   ALKPHOS 82 10/22/2013 0238   BILITOT 0.3 10/22/2013 0238   GFRNONAA 63* 10/28/2013 0350   GFRAA 73* 10/28/2013 0350   Lipase     Component Value Date/Time   LIPASE 16  06/08/2010 0430       Studies/Results: No results found.  Anti-infectives: Anti-infectives   Start     Dose/Rate Route Frequency Ordered Stop   10/22/13 1215  clindamycin (CLEOCIN) IVPB 600 mg     600 mg 100 mL/hr over 30 Minutes Intravenous 4 times per day 10/22/13 1136     10/22/13 1215  vancomycin (VANCOCIN) IVPB 1000 mg/200 mL premix     1,000 mg 200 mL/hr over 60 Minutes Intravenous Every 12 hours 10/22/13 1142     10/22/13 0430  ceFEPIme (MAXIPIME) 1 g in dextrose 5 % 50 mL IVPB     1 g 100 mL/hr over 30 Minutes Intravenous  Once 10/22/13 0423 10/22/13 0552   10/22/13 0430  vancomycin (VANCOCIN) IVPB 1000 mg/200 mL premix  Status:  Discontinued     1,000 mg 200 mL/hr over 60 Minutes Intravenous  Once 10/22/13 0423 10/22/13 1142   10/22/13 0430  metroNIDAZOLE (FLAGYL) IVPB 500 mg  Status:  Discontinued     500 mg 100 mL/hr over 60 Minutes Intravenous  Once 10/22/13 0423 10/22/13 0424   10/22/13 0430  [MAR Hold]  clindamycin (CLEOCIN) IVPB 900 mg     (On MAR Hold since 10/22/13 0615)  900 mg 100 mL/hr over 30 Minutes Intravenous  Once 10/22/13 0424 10/22/13 2505       Assessment/Plan POD #6/4/2 for what is eventual 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 PO pain meds with IV backup  2. Reg diabetic diet  3. Glucose better controlled  4. Cont vanc/clinda, await final culture results  5. Will need to monitor wound she certainly is high risk for infection or wound dehiscence  6. Start lovenox 7. OOB with PT 8. Prognostic panel pending, Med-onc on board.  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 Will need port but don't want to do that with active infection. 9. Dressing changes    LOS: 6 days    Coralie Keens 10/28/2013, 7:37 AM Pager: [phone]

## 2013-10-29 LAB — CBC
HCT: 26 % — ABNORMAL LOW (ref 36.0–46.0)
Hemoglobin: 8.6 g/dL — ABNORMAL LOW (ref 12.0–15.0)
MCH: 28.6 pg (ref 26.0–34.0)
MCHC: 33.1 g/dL (ref 30.0–36.0)
MCV: 86.4 fL (ref 78.0–100.0)
PLATELETS: 703 10*3/uL — AB (ref 150–400)
RBC: 3.01 MIL/uL — ABNORMAL LOW (ref 3.87–5.11)
RDW: 14.2 % (ref 11.5–15.5)
WBC: 17.1 10*3/uL — AB (ref 4.0–10.5)

## 2013-10-29 LAB — GLUCOSE, CAPILLARY
GLUCOSE-CAPILLARY: 144 mg/dL — AB (ref 70–99)
GLUCOSE-CAPILLARY: 108 mg/dL — AB (ref 70–99)
Glucose-Capillary: 133 mg/dL — ABNORMAL HIGH (ref 70–99)
Glucose-Capillary: 165 mg/dL — ABNORMAL HIGH (ref 70–99)
Glucose-Capillary: 153 mg/dL — ABNORMAL HIGH (ref 70–99)

## 2013-10-29 LAB — BASIC METABOLIC PANEL
BUN: 15 mg/dL (ref 6–23)
CHLORIDE: 100 meq/L (ref 96–112)
CO2: 27 mEq/L (ref 19–32)
Calcium: 8.5 mg/dL (ref 8.4–10.5)
Creatinine, Ser: 1.47 mg/dL — ABNORMAL HIGH (ref 0.50–1.10)
GFR calc non Af Amer: 38 mL/min — ABNORMAL LOW (ref >90)
GFR, EST AFRICAN AMERICAN: 44 mL/min — AB (ref >90)
GLUCOSE: 109 mg/dL — AB (ref 70–99)
POTASSIUM: 4.3 meq/L (ref 3.7–5.3)
SODIUM: 138 meq/L (ref 137–147)

## 2013-10-29 LAB — VANCOMYCIN, TROUGH: Vancomycin Tr: 30.8 ug/mL (ref 10.0–20.0)

## 2013-10-29 MED ORDER — VANCOMYCIN HCL IN DEXTROSE 1-5 GM/200ML-% IV SOLN
1000.0000 mg | INTRAVENOUS | Status: DC
  Administered 2013-10-30: 1000 mg via INTRAVENOUS
  Filled 2013-10-29 (×2): qty 200

## 2013-10-29 NOTE — Progress Notes (Signed)
3 Days Post-Op  Subjective: Pt states no pain.  Tolerating diet well.  Ambulating well.  Having trouble with lantus insulin although nurses are working with her, but her vision is very poor.    Objective: Vital signs in last 24 hours: Temp:  [98.5 F (36.9 C)-99.7 F (37.6 C)] 98.5 F (36.9 C) (05/16 0636) Pulse Rate:  [87-94] 87 (05/16 0636) Resp:  [18] 18 (05/16 0636) BP: (105-107)/(47-94) 107/50 mmHg (05/16 0636) SpO2:  [96 %-100 %] 97 % (05/16 0636) Last BM Date: 10/26/13  Intake/Output from previous day: 05/15 0701 - 05/16 0700 In: 1655.3 [P.O.:840; I.V.:815.3] Out: 195 [Drains:195] Intake/Output this shift:     PE: Gen:  Alert, NAD, pleasant Right breast: Incision with serosanguinous drainage and intact, no dehiscence, darkened areas at the inferior and lateral aspect of the incisions, minimal tenderness to palpation, drains with serous drainage to both 151m/output 24 hours   Lab Results:   Recent Labs  10/27/13 0331 10/28/13 0350  WBC 19.2* 16.4*  HGB 9.2* 8.9*  HCT 27.8* 26.4*  PLT 502* 582*   BMET  Recent Labs  10/28/13 0350 10/29/13 0610  NA 138 138  K 3.8 4.3  CL 98 100  CO2 27 27  GLUCOSE 119* 109*  BUN 11 15  CREATININE 0.97 1.47*  CALCIUM 8.4 8.5   PT/INR No results found for this basename: LABPROT, INR,  in the last 72 hours CMP     Component Value Date/Time   NA 138 10/29/2013 0610   K 4.3 10/29/2013 0610   CL 100 10/29/2013 0610   CO2 27 10/29/2013 0610   GLUCOSE 109* 10/29/2013 0610   BUN 15 10/29/2013 0610   CREATININE 1.47* 10/29/2013 0610   CALCIUM 8.5 10/29/2013 0610   PROT 7.1 10/22/2013 0238   ALBUMIN 2.4* 10/22/2013 0238   AST 12 10/22/2013 0238   ALT 10 10/22/2013 0238   ALKPHOS 82 10/22/2013 0238   BILITOT 0.3 10/22/2013 0238   GFRNONAA 38* 10/29/2013 0610   GFRAA 44* 10/29/2013 0610   Lipase     Component Value Date/Time   LIPASE 16 06/08/2010 0430       Studies/Results: No results found.  Anti-infectives: Anti-infectives    Start     Dose/Rate Route Frequency Ordered Stop   10/22/13 1215  clindamycin (CLEOCIN) IVPB 600 mg     600 mg 100 mL/hr over 30 Minutes Intravenous 4 times per day 10/22/13 1136     10/22/13 1215  vancomycin (VANCOCIN) IVPB 1000 mg/200 mL premix     1,000 mg 200 mL/hr over 60 Minutes Intravenous Every 12 hours 10/22/13 1142     10/22/13 0430  ceFEPIme (MAXIPIME) 1 g in dextrose 5 % 50 mL IVPB     1 g 100 mL/hr over 30 Minutes Intravenous  Once 10/22/13 0423 10/22/13 0552   10/22/13 0430  vancomycin (VANCOCIN) IVPB 1000 mg/200 mL premix  Status:  Discontinued     1,000 mg 200 mL/hr over 60 Minutes Intravenous  Once 10/22/13 0423 10/22/13 1142   10/22/13 0430  metroNIDAZOLE (FLAGYL) IVPB 500 mg  Status:  Discontinued     500 mg 100 mL/hr over 60 Minutes Intravenous  Once 10/22/13 0423 10/22/13 0424   10/22/13 0430  [MAR Hold]  clindamycin (CLEOCIN) IVPB 900 mg     (On MAR Hold since 10/22/13 0615)   900 mg 100 mL/hr over 30 Minutes Intravenous  Once 10/22/13 0424 10/22/13 0625     Results: HER-2/NEU BY CISH - NEGATIVE,  NO AMPLIFICATION OF HER-2 DETECTED. RESULT RATIO OF HER2: CEP 17 SIGNALS 1.24 AVERAGE HER2 COPY NUMBER PER CELL 3.6 REFERENCE RANGE NEGATIVE HER2/Chr17 Ratio <2.0 and Average HER2 copy number <4.0 EQUIVOCAL HER2/Chr17 Ratio <2.0 and Average HER2 copy number 4.0 and <6.0 POSITIVE HER2/Chr17 Ratio >=2.0 and/or Average HER2 copy number >=6.0  Results: IMMUNOHISTOCHEMICAL AND MORPHOMETRIC ANALYSIS BY THE AUTOMATED CELLULAR IMAGING SYSTEM (ACIS) Estrogen Receptor: 0%, NEGATIVE Progesterone Receptor: 0%, NEGATIVE Proliferation Marker Ki67: 95% COMMENT: The negative hormone receptor study(ies) in this case have no internal positive control.  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 1.47 today from 0.97  yesterday  Plan:  1. PO pain meds with IV backup  2. Reg diabetic diet  3. Glucose better controlled, but the nurse is very concerned that she will not be able to administer lantus because of poor vision 4. Cont vanc/clinda, culture shows gram neg cocci/rods and gram pos cocci in pairs/clusters 5. Will need to monitor wound she certainly is high risk for infection or wound dehiscence  6. Continue lovenox  7. OOB with PT  8. Prognostic panel pending, Med-onc on board. 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 Will need port but don't want to do that with active infection.  9. Dressing changes 10. Hopefully medicine can weigh in on her increased creatinine levels, increase fluids to 7m/hr, repeat labs tomorrow      LOS: 7 days    MCoralie Keens5/16/2015, 9:10 AM Pager: [phone]

## 2013-10-29 NOTE — Progress Notes (Signed)
Patient ID: Carmen Stone, female   DOB: Oct 03, 1955, 58 y.o.   MRN: [id number]  [facility] PROGRESS NOTE  VERLYN DANNENBERG JQZ:[id number] DOB: 04-Jul-1955 DOA: 10/22/2013 PCP: [name], MD  Brief narrative:  58 y.o. female, with history of type 2 diabetes mellitus who is on Glucophage, no other known medical problems, was admitted by general surgery service for right breast drainage due to right breast abscess, she was taken to the OR on 10/24/2013 for incision and drainage, [facility] was consulted to manage diabetes.   Assessment/Plan:  DIABETES MELLITUS, TYPE II  - moderate control in setting of necrotizing infection  - cont current Lantus and SSI  - poor control pre admit: HgbA1c 11.8  - appreciate diabetic educator input  - CBG's in 120 - 130 over the past 24 hours  Acute respiratory failure with hypoxia  - CXR unremarkable  - resolved  Postoperative anemia due to acute blood loss  - baseline hgb ~9.6  - transfused 2 units on 5/12 since SBP 90s and c/w sx's-5/13 Hgb up to 9.8 --> 9.2 --> 8.9 - repeat CBC in AM  Abnormal urinalysis  - appears c/w UTI  - cx pending- obtained after anbx's started  Breast abscess of female/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   Objective: Filed Vitals:   10/28/13 1333 10/28/13 1444 10/28/13 2230 10/29/13 0636  BP: 105/47  107/94 107/50  Pulse: 92  94 87  Temp: 99.7 F  (37.6 C) 99.1 F (37.3 C) 98.8 F (37.1 C) 98.5 F (36.9 C)  TempSrc: Oral  Oral Oral  Resp: 18  18 18   Height:      Weight:      SpO2: 100%  96% 97%    Intake/Output Summary (Last 24 hours) at 10/29/13 0810 Last data filed at 10/29/13 0600  Gross per 24 hour  Intake 1655.33 ml  Output    195 ml  Net 1460.33 ml    Exam:   General:  Pt is alert, follows commands appropriately, not in acute distress  Cardiovascular: Regular rate and rhythm, S1/S2, no murmurs, no rubs, no gallops  Respiratory: Clear to auscultation bilaterally, no wheezing, no crackles, no rhonchi  Abdomen: Soft, non tender, non distended, bowel sounds present, no guarding   Data Reviewed: Basic Metabolic Panel:  Recent Labs Lab 10/26/13 0225 10/26/13 1720 10/27/13 0331 10/28/13 0350 10/29/13 0610  NA 139 138 140 138 138  K 3.5* 4.0 3.8 3.8 4.3  CL 104 102 101 98 100  CO2 26 22 26 27 27   GLUCOSE 123* 187* 155* 119* 109*  BUN 9 7 7 11 15   CREATININE 0.63 0.65 0.67 0.97 1.47*  CALCIUM 7.9* 7.7* 8.2* 8.4 8.5   CBC:  Recent Labs Lab 10/25/13 0905 10/26/13 0225 10/26/13 1720 10/27/13 0331 10/28/13 0350  WBC 9.1 11.4* 18.3* 19.2* 16.4*  HGB 8.0* 9.1* 9.8* 9.2* 8.9*  HCT 24.0* 27.4*  28.6* 27.8* 26.4*  MCV 83.9 83.5 84.4 84.5 86.8  PLT 390 432* 438* 502* 582*   CBG:  Recent Labs Lab 10/28/13 1321 10/28/13 1645 10/28/13 1809 10/28/13 2157 10/29/13 0733  GLUCAP 109* 159* 163* 122* 133*    Recent Results (from the past 240 hour(s))  CULTURE, BLOOD (ROUTINE X 2)     Status: None   Collection Time    10/22/13  3:03 AM      Result Value Ref Range Status   Specimen Description BLOOD LEFT ANTECUBITAL   Final   Special Requests BOTTLES DRAWN AEROBIC AND ANAEROBIC 10CC EACH   Final   Culture  Setup Time     Final   Value: 10/22/2013 12:09     Performed at [facility]   Culture     Final   Value: NO GROWTH 5 DAYS     Performed at [facility]   Report Status  10/28/2013 FINAL   Final  CULTURE, BLOOD (ROUTINE X 2)     Status: None   Collection Time    10/22/13  3:08 AM      Result Value Ref Range Status   Specimen Description BLOOD RIGHT ANTECUBITAL   Final   Special Requests BOTTLES DRAWN AEROBIC ONLY 10CC   Final   Culture  Setup Time     Final   Value: 10/22/2013 12:09     Performed at [facility]   Culture     Final   Value: NO GROWTH 5 DAYS     Performed at [facility]   Report Status 10/28/2013 FINAL   Final  WOUND CULTURE     Status: None   Collection Time    10/22/13  4:35 AM      Result Value Ref Range Status   Specimen Description WOUND   Final   Special Requests BREAST   Final   Gram Stain     Final   Value: MODERATE WBC PRESENT, PREDOMINANTLY PMN     RARE SQUAMOUS EPITHELIAL CELLS PRESENT     FEW GRAM NEGATIVE RODS     FEW GRAM POSITIVE RODS     MODERATE GRAM POSITIVE COCCI IN PAIRS   Culture     Final   Value: MODERATE GROUP B STREP(S.AGALACTIAE)ISOLATED     Note: TESTING AGAINST S. AGALACTIAE NOT ROUTINELY PERFORMED DUE TO PREDICTABILITY OF AMP/PEN/VAN SUSCEPTIBILITY.     Performed at [facility]   Report Status 10/24/2013 FINAL   Final  ANAEROBIC CULTURE     Status: None   Collection Time    10/22/13  6:46 AM      Result Value Ref Range Status   Specimen Description WOUND BREAST RIGHT   Final   Special Requests NONE   Final   Gram Stain     Final   Value: ABUNDANT WBC PRESENT, PREDOMINANTLY PMN     FEW SQUAMOUS EPITHELIAL CELLS PRESENT     ABUNDANT GRAM POSITIVE COCCI IN PAIRS     IN CLUSTERS IN CHAINS FEW GRAM NEGATIVE COCCI     MODERATE GRAM NEGATIVE RODS   Culture     Final   Value: NO ANAEROBES ISOLATED     Performed at [facility]   Report Status 10/27/2013 FINAL   Final  WOUND CULTURE     Status: None   Collection Time    10/22/13  6:46 AM      Result Value Ref Range Status   Specimen Description WOUND  BREAST RIGHT   Final   Special Requests NONE   Final   Gram  Stain     Final   Value: MODERATE WBC PRESENT, PREDOMINANTLY PMN     RARE SQUAMOUS EPITHELIAL CELLS PRESENT     ABUNDANT GRAM POSITIVE COCCI IN PAIRS     IN CHAINS FEW GRAM POSITIVE RODS     FEW GRAM NEGATIVE RODS   Culture     Final   Value: MODERATE GROUP B STREP(S.AGALACTIAE)ISOLATED     Note: TESTING AGAINST S. AGALACTIAE NOT ROUTINELY PERFORMED DUE TO PREDICTABILITY OF AMP/PEN/VAN SUSCEPTIBILITY.     Performed at [facility]   Report Status 10/25/2013 FINAL   Final  SURGICAL PCR SCREEN     Status: Abnormal   Collection Time    10/24/13  8:51 AM      Result Value Ref Range Status   MRSA, PCR POSITIVE (*) NEGATIVE Final   Staphylococcus aureus POSITIVE (*) NEGATIVE Final   Comment:            The Xpert SA Assay (FDA     approved for NASAL specimens     in patients over 76 years of age),     is one component of     a comprehensive surveillance     program.  Test performance has     been validated by [employer] for patients greater     than or equal to 2 year old.     It is not intended     to diagnose infection nor to     guide or monitor treatment.  URINE CULTURE     Status: None   Collection Time    10/25/13  7:41 PM      Result Value Ref Range Status   Specimen Description URINE, RANDOM   Final   Special Requests NONE   Final   Culture  Setup Time     Final   Value: 10/26/2013 00:49     Performed at [place]     Final   Value: 30,000 COLONIES/ML     Performed at [facility]   Culture     Final   Value: Multiple bacterial morphotypes present, none predominant. Suggest appropriate recollection if clinically indicated.     Performed at [facility]   Report Status 10/26/2013 FINAL   Final     Scheduled Meds: . Chlorhexidine Gluconate Cloth  6 each Topical Q0600  . clindamycin (CLEOCIN) IV  600 mg Intravenous 4 times per day  . enoxaparin (LOVENOX) injection  40 mg Subcutaneous Q24H  . insulin aspart  0-20  Units Subcutaneous TID WC  . insulin aspart  0-5 Units Subcutaneous QHS  . insulin glargine  14 Units Subcutaneous Daily  . mupirocin ointment  1 application Nasal BID  . vancomycin  1,000 mg Intravenous Q12H   Continuous Infusions: . lactated ringers 20 mL/hr at 10/28/13 1704   Theodis Blaze, MD  [facility] Pager [phone]  If 7PM-7AM, please contact night-coverage [email or web address] Password [email or web address] 10/29/2013, 8:10 AM   LOS: 7 days

## 2013-10-29 NOTE — Progress Notes (Signed)
ANTIBIOTIC CONSULT NOTE - FOLLOW UP  Pharmacy Consult:  Vancomycin Indication:  Breast abscess  No Known Allergies  Patient Measurements: Height: 5\' 4"  (162.6 cm) Weight: 164 lb 5 oz (74.532 kg) IBW/kg (Calculated) : 54.7  Vital Signs: Temp: 98.5 F (36.9 C) (05/16 0636) Temp src: Oral (05/16 0636) BP: 107/50 mmHg (05/16 0636) Pulse Rate: 87 (05/16 0636) Intake/Output from previous day: 05/15 0701 - 05/16 0700 In: 1655.3 [P.O.:840; I.V.:815.3] Out: 195 [Drains:195] Intake/Output from this shift: Total I/O In: 360 [P.O.:360] Out: -   Labs:  Recent Labs  10/26/13 1720 10/27/13 0331 10/28/13 0350 10/29/13 0610  WBC 18.3* 19.2* 16.4*  --   HGB 9.8* 9.2* 8.9*  --   PLT 438* 502* 582*  --   CREATININE 0.65 0.67 0.97 1.47*   Estimated Creatinine Clearance: 40.7 ml/min (by C-G formula based on Cr of 1.47). No results found for this basename: VANCOTROUGH, VANCOPEAK, VANCORANDOM, GENTTROUGH, GENTPEAK, GENTRANDOM, TOBRATROUGH, TOBRAPEAK, TOBRARND, AMIKACINPEAK, AMIKACINTROU, AMIKACIN,  in the last 72 hours   Microbiology: Recent Results (from the past 720 hour(s))  CULTURE, BLOOD (ROUTINE X 2)     Status: None   Collection Time    10/22/13  3:03 AM      Result Value Ref Range Status   Specimen Description BLOOD LEFT ANTECUBITAL   Final   Special Requests BOTTLES DRAWN AEROBIC AND ANAEROBIC 10CC EACH   Final   Culture  Setup Time     Final   Value: 10/22/2013 12:09     Performed at [facility]   Culture     Final   Value: NO GROWTH 5 DAYS     Performed at [facility]   Report Status 10/28/2013 FINAL   Final  CULTURE, BLOOD (ROUTINE X 2)     Status: None   Collection Time    10/22/13  3:08 AM      Result Value Ref Range Status   Specimen Description BLOOD RIGHT ANTECUBITAL   Final   Special Requests BOTTLES DRAWN AEROBIC ONLY 10CC   Final   Culture  Setup Time     Final   Value: 10/22/2013 12:09     Performed at [facility]   Culture      Final   Value: NO GROWTH 5 DAYS     Performed at [facility]   Report Status 10/28/2013 FINAL   Final  WOUND CULTURE     Status: None   Collection Time    10/22/13  4:35 AM      Result Value Ref Range Status   Specimen Description WOUND   Final   Special Requests BREAST   Final   Gram Stain     Final   Value: MODERATE WBC PRESENT, PREDOMINANTLY PMN     RARE SQUAMOUS EPITHELIAL CELLS PRESENT     FEW GRAM NEGATIVE RODS     FEW GRAM POSITIVE RODS     MODERATE GRAM POSITIVE COCCI IN PAIRS   Culture     Final   Value: MODERATE GROUP B STREP(S.AGALACTIAE)ISOLATED     Note: TESTING AGAINST S. AGALACTIAE NOT ROUTINELY PERFORMED DUE TO PREDICTABILITY OF AMP/PEN/VAN SUSCEPTIBILITY.     Performed at [facility]   Report Status 10/24/2013 FINAL   Final  ANAEROBIC CULTURE     Status: None   Collection Time    10/22/13  6:46 AM      Result Value Ref Range Status   Specimen Description WOUND BREAST RIGHT  Final   Special Requests NONE   Final   Gram Stain     Final   Value: ABUNDANT WBC PRESENT, PREDOMINANTLY PMN     FEW SQUAMOUS EPITHELIAL CELLS PRESENT     ABUNDANT GRAM POSITIVE COCCI IN PAIRS     IN CLUSTERS IN CHAINS FEW GRAM NEGATIVE COCCI     MODERATE GRAM NEGATIVE RODS   Culture     Final   Value: NO ANAEROBES ISOLATED     Performed at [facility]   Report Status 10/27/2013 FINAL   Final  WOUND CULTURE     Status: None   Collection Time    10/22/13  6:46 AM      Result Value Ref Range Status   Specimen Description WOUND BREAST RIGHT   Final   Special Requests NONE   Final   Gram Stain     Final   Value: MODERATE WBC PRESENT, PREDOMINANTLY PMN     RARE SQUAMOUS EPITHELIAL CELLS PRESENT     ABUNDANT GRAM POSITIVE COCCI IN PAIRS     IN CHAINS FEW GRAM POSITIVE RODS     FEW GRAM NEGATIVE RODS   Culture     Final   Value: MODERATE GROUP B STREP(S.AGALACTIAE)ISOLATED     Note: TESTING AGAINST S. AGALACTIAE NOT ROUTINELY PERFORMED DUE TO PREDICTABILITY  OF AMP/PEN/VAN SUSCEPTIBILITY.     Performed at [facility]   Report Status 10/25/2013 FINAL   Final  SURGICAL PCR SCREEN     Status: Abnormal   Collection Time    10/24/13  8:51 AM      Result Value Ref Range Status   MRSA, PCR POSITIVE (*) NEGATIVE Final   Staphylococcus aureus POSITIVE (*) NEGATIVE Final   Comment:            The Xpert SA Assay (FDA     approved for NASAL specimens     in patients over 24 years of age),     is one component of     a comprehensive surveillance     program.  Test performance has     been validated by [employer] for patients greater     than or equal to 4 year old.     It is not intended     to diagnose infection nor to     guide or monitor treatment.  URINE CULTURE     Status: None   Collection Time    10/25/13  7:41 PM      Result Value Ref Range Status   Specimen Description URINE, RANDOM   Final   Special Requests NONE   Final   Culture  Setup Time     Final   Value: 10/26/2013 00:49     Performed at [place]     Final   Value: 30,000 COLONIES/ML     Performed at [facility]   Culture     Final   Value: Multiple bacterial morphotypes present, none predominant. Suggest appropriate recollection if clinically indicated.     Performed at [facility]   Report Status 10/26/2013 FINAL   Final      Assessment: 38 YOF with breast abscess to continue on vancomycin and clindamycin.  Patient has AKI; SCr more than doubled since last vancomycin trough on 10/26/13.  Concern with accumulation.  Clinda 5/9 >> Vanc 5/9 >>  5/13 VT = 13.3 mcg/mL  5/9 Blood x  2 - ngtd 5/9 wound - Group A Strep 5/9 anaerobes - ngtd 5/12 urine cx (post abx initiation) - negative   Goal of Therapy:  Vancomycin trough level 10-15 mcg/ml   Plan:  - Change vanc to 1gm IV Q24H - Clinda 600mg  IV Q6H - Monitor renal fxn, clinical course, repeat vanc trough - F/U abx LOT    Oron Westrup D. Mina Marble, PharmD,  BCPS Pager:  [phone] 10/29/2013, 10:57 AM

## 2013-10-29 NOTE — Progress Notes (Signed)
Allowed pt. To give herself her insulin this morning.  Pt. Was able to explain the process.  Biggest issue is that pt. Has trouble seeing the lines on the insulin syringes.  Will continue to educate and provide resources as needed. [name]

## 2013-10-29 NOTE — Progress Notes (Signed)
  Pt will need HHN at discharge especiallly with physical limitation of poor eye sight.  Wound will be an issue.  Needs oncology follow up.

## 2013-10-29 NOTE — Progress Notes (Signed)
Vancomycin Trough Level= 30.8. Pharmacist, Santiago Glad aware. Vancomycin dose has already been adjusted. No further action will be  taken at this time per pharmacy. Will continue to monitor. KYoung RN

## 2013-10-29 NOTE — Progress Notes (Signed)
Patient was given the Scale Magnifier and Needle Guide to help with the drawing of insulin at home. Patient said she can see better with it. Practiced drawing of insulin with the RN. Will reinforce teaching in the morning. No other concern at this time.   KYoung RN

## 2013-10-29 NOTE — Progress Notes (Signed)
ANTIBIOTIC CONSULT NOTE - FOLLOW UP  Pharmacy Consult:  Vancomycin Indication:  Breast abscess  No Known Allergies  Patient Measurements: Height: 5\' 4"  (162.6 cm) Weight: 164 lb 5 oz (74.532 kg) IBW/kg (Calculated) : 54.7  Vital Signs: Temp: 98.4 F (36.9 C) (05/16 2139) Temp src: Oral (05/16 2139) BP: 107/46 mmHg (05/16 2139) Pulse Rate: 93 (05/16 2139) Intake/Output from previous day: 05/15 0701 - 05/16 0700 In: 1655.3 [P.O.:840; I.V.:815.3] Out: 195 [Drains:195] Intake/Output from this shift: Total I/O In: 220 [P.O.:220] Out: -   Labs:  Recent Labs  10/27/13 0331 10/28/13 0350 10/29/13 0610 10/29/13 1140  WBC 19.2* 16.4*  --  17.1*  HGB 9.2* 8.9*  --  8.6*  PLT 502* 582*  --  703*  CREATININE 0.67 0.97 1.47*  --    Estimated Creatinine Clearance: 40.7 ml/min (by C-G formula based on Cr of 1.47).  Recent Labs  10/29/13 2158  [place] 30.8*     Microbiology: Recent Results (from the past 720 hour(s))  CULTURE, BLOOD (ROUTINE X 2)     Status: None   Collection Time    10/22/13  3:03 AM      Result Value Ref Range Status   Specimen Description BLOOD LEFT ANTECUBITAL   Final   Special Requests BOTTLES DRAWN AEROBIC AND ANAEROBIC 10CC EACH   Final   Culture  Setup Time     Final   Value: 10/22/2013 12:09     Performed at [facility]   Culture     Final   Value: NO GROWTH 5 DAYS     Performed at [facility]   Report Status 10/28/2013 FINAL   Final  CULTURE, BLOOD (ROUTINE X 2)     Status: None   Collection Time    10/22/13  3:08 AM      Result Value Ref Range Status   Specimen Description BLOOD RIGHT ANTECUBITAL   Final   Special Requests BOTTLES DRAWN AEROBIC ONLY 10CC   Final   Culture  Setup Time     Final   Value: 10/22/2013 12:09     Performed at [facility]   Culture     Final   Value: NO GROWTH 5 DAYS     Performed at [facility]   Report Status 10/28/2013 FINAL   Final  WOUND CULTURE     Status:  None   Collection Time    10/22/13  4:35 AM      Result Value Ref Range Status   Specimen Description WOUND   Final   Special Requests BREAST   Final   Gram Stain     Final   Value: MODERATE WBC PRESENT, PREDOMINANTLY PMN     RARE SQUAMOUS EPITHELIAL CELLS PRESENT     FEW GRAM NEGATIVE RODS     FEW GRAM POSITIVE RODS     MODERATE GRAM POSITIVE COCCI IN PAIRS   Culture     Final   Value: MODERATE GROUP B STREP(S.AGALACTIAE)ISOLATED     Note: TESTING AGAINST S. AGALACTIAE NOT ROUTINELY PERFORMED DUE TO PREDICTABILITY OF AMP/PEN/VAN SUSCEPTIBILITY.     Performed at [facility]   Report Status 10/24/2013 FINAL   Final  ANAEROBIC CULTURE     Status: None   Collection Time    10/22/13  6:46 AM      Result Value Ref Range Status   Specimen Description WOUND BREAST RIGHT   Final   Special Requests NONE   Final  Gram Stain     Final   Value: ABUNDANT WBC PRESENT, PREDOMINANTLY PMN     FEW SQUAMOUS EPITHELIAL CELLS PRESENT     ABUNDANT GRAM POSITIVE COCCI IN PAIRS     IN CLUSTERS IN CHAINS FEW GRAM NEGATIVE COCCI     MODERATE GRAM NEGATIVE RODS   Culture     Final   Value: NO ANAEROBES ISOLATED     Performed at [facility]   Report Status 10/27/2013 FINAL   Final  WOUND CULTURE     Status: None   Collection Time    10/22/13  6:46 AM      Result Value Ref Range Status   Specimen Description WOUND BREAST RIGHT   Final   Special Requests NONE   Final   Gram Stain     Final   Value: MODERATE WBC PRESENT, PREDOMINANTLY PMN     RARE SQUAMOUS EPITHELIAL CELLS PRESENT     ABUNDANT GRAM POSITIVE COCCI IN PAIRS     IN CHAINS FEW GRAM POSITIVE RODS     FEW GRAM NEGATIVE RODS   Culture     Final   Value: MODERATE GROUP B STREP(S.AGALACTIAE)ISOLATED     Note: TESTING AGAINST S. AGALACTIAE NOT ROUTINELY PERFORMED DUE TO PREDICTABILITY OF AMP/PEN/VAN SUSCEPTIBILITY.     Performed at [facility]   Report Status 10/25/2013 FINAL   Final  SURGICAL PCR SCREEN      Status: Abnormal   Collection Time    10/24/13  8:51 AM      Result Value Ref Range Status   MRSA, PCR POSITIVE (*) NEGATIVE Final   Staphylococcus aureus POSITIVE (*) NEGATIVE Final   Comment:            The Xpert SA Assay (FDA     approved for NASAL specimens     in patients over 36 years of age),     is one component of     a comprehensive surveillance     program.  Test performance has     been validated by [employer] for patients greater     than or equal to 54 year old.     It is not intended     to diagnose infection nor to     guide or monitor treatment.  URINE CULTURE     Status: None   Collection Time    10/25/13  7:41 PM      Result Value Ref Range Status   Specimen Description URINE, RANDOM   Final   Special Requests NONE   Final   Culture  Setup Time     Final   Value: 10/26/2013 00:49     Performed at [place]     Final   Value: 30,000 COLONIES/ML     Performed at [facility]   Culture     Final   Value: Multiple bacterial morphotypes present, none predominant. Suggest appropriate recollection if clinically indicated.     Performed at [facility]   Report Status 10/26/2013 FINAL   Final      Assessment: 2 YOF with breast abscess to continue on vancomycin and clindamycin.  Patient has AKI; SCr more than doubled since last vancomycin trough on 10/26/13.  Concern with accumulation.  Clinda 5/9 >> Vanc 5/9 >>  5/13 VT = 13.3 mcg/mL on 1gm IV q12h 5/16 VT = 30.8 mcg/mL on 1gm IV q12h  5/9  Blood x 2 - ngtd 5/9 wound - Group A Strep 5/9 anaerobes - ngtd 5/12 urine cx (post abx initiation) - negative   Goal of Therapy:  Vancomycin trough level 10-15 mcg/ml   Plan:  - Continue Vancomycin 1gm IV q24h (dose empirically changed this a.m. but q12h dosing trough still obtained) - Clinda 600mg  IV Q6H - Monitor renal fxn, clinical course - F/U abx LOT  Sherlon Handing, PharmD, BCPS Clinical pharmacist, pager  [phone] 10/29/2013, 11:17 PM

## 2013-10-30 DIAGNOSIS — N289 Disorder of kidney and ureter, unspecified: Secondary | ICD-10-CM

## 2013-10-30 LAB — COMPREHENSIVE METABOLIC PANEL
ALK PHOS: 70 U/L (ref 39–117)
ALT: 6 U/L (ref 0–35)
AST: 20 U/L (ref 0–37)
Albumin: 2.2 g/dL — ABNORMAL LOW (ref 3.5–5.2)
BUN: 15 mg/dL (ref 6–23)
CO2: 23 mEq/L (ref 19–32)
CREATININE: 1.72 mg/dL — AB (ref 0.50–1.10)
Calcium: 8.7 mg/dL (ref 8.4–10.5)
Chloride: 99 mEq/L (ref 96–112)
GFR calc non Af Amer: 31 mL/min — ABNORMAL LOW (ref >90)
GFR, EST AFRICAN AMERICAN: 36 mL/min — AB (ref >90)
GLUCOSE: 175 mg/dL — AB (ref 70–99)
Potassium: 4.2 mEq/L (ref 3.7–5.3)
Sodium: 138 mEq/L (ref 137–147)
TOTAL PROTEIN: 6.5 g/dL (ref 6.0–8.3)
Total Bilirubin: <0.2 mg/dL — ABNORMAL LOW (ref 0.3–1.2)

## 2013-10-30 LAB — BASIC METABOLIC PANEL
BUN: 15 mg/dL (ref 6–23)
CALCIUM: 8.7 mg/dL (ref 8.4–10.5)
CO2: 26 mEq/L (ref 19–32)
CREATININE: 1.67 mg/dL — AB (ref 0.50–1.10)
Chloride: 99 mEq/L (ref 96–112)
GFR calc non Af Amer: 33 mL/min — ABNORMAL LOW (ref >90)
GFR, EST AFRICAN AMERICAN: 38 mL/min — AB (ref >90)
Glucose, Bld: 178 mg/dL — ABNORMAL HIGH (ref 70–99)
Potassium: 4 mEq/L (ref 3.7–5.3)
SODIUM: 138 meq/L (ref 137–147)

## 2013-10-30 LAB — CBC
HCT: 25.1 % — ABNORMAL LOW (ref 36.0–46.0)
Hemoglobin: 8.3 g/dL — ABNORMAL LOW (ref 12.0–15.0)
MCH: 28.8 pg (ref 26.0–34.0)
MCHC: 33.1 g/dL (ref 30.0–36.0)
MCV: 87.2 fL (ref 78.0–100.0)
PLATELETS: 729 10*3/uL — AB (ref 150–400)
RBC: 2.88 MIL/uL — AB (ref 3.87–5.11)
RDW: 14.3 % (ref 11.5–15.5)
WBC: 14.9 10*3/uL — ABNORMAL HIGH (ref 4.0–10.5)

## 2013-10-30 LAB — CREATININE, URINE, RANDOM: CREATININE, URINE: 34.01 mg/dL

## 2013-10-30 LAB — GLUCOSE, CAPILLARY
GLUCOSE-CAPILLARY: 116 mg/dL — AB (ref 70–99)
Glucose-Capillary: 114 mg/dL — ABNORMAL HIGH (ref 70–99)
Glucose-Capillary: 186 mg/dL — ABNORMAL HIGH (ref 70–99)
Glucose-Capillary: 106 mg/dL — ABNORMAL HIGH (ref 70–99)

## 2013-10-30 LAB — SODIUM, URINE, RANDOM: Sodium, Ur: 35 mEq/L

## 2013-10-30 MED ORDER — SODIUM CHLORIDE 0.9 % IV SOLN
INTRAVENOUS | Status: AC
  Administered 2013-10-30: 13:00:00 via INTRAVENOUS

## 2013-10-30 MED ORDER — SODIUM CHLORIDE 0.9 % IV SOLN: 250.0000 mL | INTRAVENOUS | Status: DC | PRN

## 2013-10-30 MED ORDER — SODIUM CHLORIDE 0.9 % IJ SOLN: 3.0000 mL | INTRAMUSCULAR | Status: DC | PRN

## 2013-10-30 MED ORDER — SODIUM CHLORIDE 0.9 % IJ SOLN
3.0000 mL | Freq: Two times a day (BID) | INTRAMUSCULAR | Status: DC
  Administered 2013-10-30 – 2013-11-06 (×6): 3 mL via INTRAVENOUS

## 2013-10-30 NOTE — Progress Notes (Signed)
4 Days Post-Op  Subjective: PT FEELS OK  Objective: Vital signs in last 24 hours: Temp:  [98 F (36.7 C)-98.7 F (37.1 C)] 98 F (36.7 C) (05/17 0507) Pulse Rate:  [79-93] 79 (05/17 0507) Resp:  [16-18] 16 (05/17 0507) BP: (107-113)/(46-60) 109/54 mmHg (05/17 0507) SpO2:  [96 %-98 %] 96 % (05/17 0507) Last BM Date: 10/26/13  Intake/Output from previous day: 05/16 0701 - 05/17 0700 In: 1400 [P.O.:1400] Out: 225 [Drains:225] Intake/Output this shift: Total I/O In: 240 [P.O.:240] Out: -   Incision/Wound:INTACT JP SEROUS RIGHT CHEST  Lab Results:   Recent Labs  10/29/13 1140 10/30/13 1014  WBC 17.1* 14.9*  HGB 8.6* 8.3*  HCT 26.0* 25.1*  PLT 703* 729*   BMET  Recent Labs  10/28/13 0350 10/29/13 0610  NA 138 138  K 3.8 4.3  CL 98 100  CO2 27 27  GLUCOSE 119* 109*  BUN 11 15  CREATININE 0.97 1.47*  CALCIUM 8.4 8.5   PT/INR No results found for this basename: LABPROT, INR,  in the last 72 hours ABG No results found for this basename: PHART, PCO2, PO2, HCO3,  in the last 72 hours  Studies/Results: No results found.  Anti-infectives: Anti-infectives   Start     Dose/Rate Route Frequency Ordered Stop   10/30/13 1000  vancomycin (VANCOCIN) IVPB 1000 mg/200 mL premix     1,000 mg 200 mL/hr over 60 Minutes Intravenous Every 24 hours 10/29/13 1102     10/22/13 1215  clindamycin (CLEOCIN) IVPB 600 mg     600 mg 100 mL/hr over 30 Minutes Intravenous 4 times per day 10/22/13 1136     10/22/13 1215  vancomycin (VANCOCIN) IVPB 1000 mg/200 mL premix  Status:  Discontinued     1,000 mg 200 mL/hr over 60 Minutes Intravenous Every 12 hours 10/22/13 1142 10/29/13 1102   10/22/13 0430  ceFEPIme (MAXIPIME) 1 g in dextrose 5 % 50 mL IVPB     1 g 100 mL/hr over 30 Minutes Intravenous  Once 10/22/13 0423 10/22/13 0552   10/22/13 0430  vancomycin (VANCOCIN) IVPB 1000 mg/200 mL premix  Status:  Discontinued     1,000 mg 200 mL/hr over 60 Minutes Intravenous  Once  10/22/13 0423 10/22/13 1142   10/22/13 0430  metroNIDAZOLE (FLAGYL) IVPB 500 mg  Status:  Discontinued     500 mg 100 mL/hr over 60 Minutes Intravenous  Once 10/22/13 0423 10/22/13 0424   10/22/13 0430  [MAR Hold]  clindamycin (CLEOCIN) IVPB 900 mg     (On MAR Hold since 10/22/13 0615)   900 mg 100 mL/hr over 30 Minutes Intravenous  Once 10/22/13 0424 10/22/13 0625      Assessment/Plan: s/p Procedure(s): COMPLETION MASTECTOMY, AXILLARY DISSECTION (Right) POD #7/5/3 for what is eventual 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 1.47 today from 0.97 yesterday  Plan:  1. PO pain meds with IV backup  2. Reg diabetic diet  3. Glucose better controlled, but the nurse is very concerned that she will not be able to administer lantus because of poor vision  4. Cont vanc/clinda, culture shows gram neg cocci/rods and gram pos cocci in pairs/clusters  Recheck labs Hopefully home this week.   LOS: 8 days    Linna Thebeau A. Aleyda Gindlesperger 10/30/2013

## 2013-10-30 NOTE — Progress Notes (Signed)
Patient ID: Carmen Stone, female   DOB: [birth year], 58 y.o.   MRN: [id number]  [facility] PROGRESS NOTE  Carmen Stone DOB: 01-Jan-1956 DOA: 10/22/2013 PCP: [name], MD  Brief narrative:  58 y.o. female, with history of type 2 diabetes mellitus who is on Glucophage, no other known medical problems, was admitted by general surgery service for right breast drainage due to right breast abscess, she was taken to the OR on 10/24/2013 for incision and drainage, [facility] was consulted to manage diabetes.   Assessment/Plan:  DIABETES MELLITUS, TYPE II  - moderate control in setting of necrotizing infection  - CBG remain in 120's - 180's over the past 24 hours, which is reasonably well controlled  - cont current Lantus and SSI  - poor control pre admit: HgbA1c 11.8  - appreciate diabetic educator input  Acute renal failure - Cr trending up over the past 48 hours - will check urine Na and urine Cr, ? Vancomycin side effect - will provide gentle hydration for now until more date is back  Acute respiratory failure with hypoxia  - CXR unremarkable  - resolved  Postoperative anemia due to acute blood loss  - baseline hgb ~9.0 - transfused 2 units on 5/12 since SBP 90s and c/w sx's-5/13 Hgb up to 9.8 --> 9.2 --> 8.6 --> 8.3 - repeat CBC in AM  Abnormal urinalysis  - appears c/w UTI  - urine culture from 5/12  With contamination, 30K of multiple morphotypes - pt denies any specific urinary concerns and since already on ABX no need to repeat urine culture unless pt is symptomatic  Breast abscess of female/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   Objective: Filed Vitals:   10/29/13 0636 10/29/13 1345 10/29/13 2139 10/30/13 0507  BP: 107/50 113/60 107/46 109/54  Pulse: 87 89 93 79  Temp: 98.5 F (36.9 C) 98.7 F (37.1 C) 98.4 F (36.9 C) 98 F (36.7 C)  TempSrc: Oral Oral Oral Oral  Resp: 18 18 17 16   Height:      Weight:      SpO2: 97% 98% 96% 96%    Intake/Output Summary (Last 24 hours) at 10/30/13 1215 Last data filed at 10/30/13 0907  Gross per 24 hour  Intake   1160 ml  Output    225 ml  Net    935 ml    Exam:   General:  Pt is alert, follows commands appropriately, not in acute distress  Cardiovascular: Regular rate and rhythm, S1/S2, no murmurs, no rubs, no gallops  Respiratory: Clear to auscultation bilaterally, no wheezing, no crackles, no rhonchi  Abdomen: Soft, non tender, non distended, bowel sounds present, no guarding  Data Reviewed: Basic Metabolic Panel:  Recent Labs Lab 10/26/13 1720 10/27/13 0331 10/28/13 0350 10/29/13 0610 10/30/13 1014  NA 138 140 138 138 138  K 4.0 3.8 3.8 4.3 4.0  CL 102 101 98 100 99  CO2 22 26 27 27 26   GLUCOSE 187* 155* 119* 109* 178*  BUN 7 7 11 15 15   CREATININE 0.65 0.67 0.97 1.47* 1.67*  CALCIUM 7.7* 8.2* 8.4 8.5 8.7   CBC:  Recent Labs Lab 10/26/13 1720 10/27/13 0331 10/28/13 0350 10/29/13 1140 10/30/13 1014  WBC 18.3* 19.2* 16.4* 17.1* 14.9*  HGB 9.8* 9.2* 8.9* 8.6* 8.3*  HCT 28.6* 27.8* 26.4* 26.0* 25.1*  MCV 84.4 84.5 86.8 86.4 87.2  PLT 438* 502* 582* 703* 729*   CBG:  Recent Labs Lab 10/29/13 1307 10/29/13 1707 10/29/13 2137 10/30/13 0726 10/30/13 1151  GLUCAP 153* 144* 108* 114* 186*    Recent Results (from the past 240 hour(s))  CULTURE, BLOOD (ROUTINE X 2)     Status: None    Collection Time    10/22/13  3:03 AM      Result Value Ref Range Status   Specimen Description BLOOD LEFT ANTECUBITAL   Final   Special Requests BOTTLES DRAWN AEROBIC AND ANAEROBIC 10CC EACH   Final   Culture  Setup Time     Final   Value: 10/22/2013 12:09     Performed at [facility]   Culture     Final   Value: NO GROWTH 5 DAYS     Performed at [facility]   Report Status 10/28/2013 FINAL   Final  CULTURE, BLOOD (ROUTINE X 2)     Status: None   Collection Time    10/22/13  3:08 AM      Result Value Ref Range Status   Specimen Description BLOOD RIGHT ANTECUBITAL   Final   Special Requests BOTTLES DRAWN AEROBIC ONLY 10CC   Final   Culture  Setup Time     Final   Value: 10/22/2013 12:09     Performed at [facility]   Culture     Final   Value: NO GROWTH 5 DAYS     Performed at [facility]   Report Status 10/28/2013 FINAL   Final  WOUND CULTURE     Status: None   Collection Time    10/22/13  4:35 AM      Result Value Ref Range Status   Specimen Description WOUND   Final   Special Requests BREAST   Final   Gram Stain     Final   Value: MODERATE WBC PRESENT, PREDOMINANTLY PMN     RARE SQUAMOUS EPITHELIAL CELLS PRESENT     FEW GRAM NEGATIVE RODS     FEW GRAM POSITIVE RODS     MODERATE GRAM POSITIVE COCCI IN PAIRS   Culture     Final   Value: MODERATE GROUP B STREP(S.AGALACTIAE)ISOLATED     Note: TESTING AGAINST S. AGALACTIAE NOT ROUTINELY PERFORMED DUE TO PREDICTABILITY OF AMP/PEN/VAN SUSCEPTIBILITY.     Performed at [facility]   Report Status 10/24/2013 FINAL   Final  ANAEROBIC CULTURE     Status: None   Collection Time    10/22/13  6:46 AM      Result Value Ref Range Status   Specimen Description WOUND BREAST RIGHT   Final   Special Requests NONE   Final   Gram Stain     Final   Value: ABUNDANT WBC PRESENT, PREDOMINANTLY PMN     FEW SQUAMOUS EPITHELIAL CELLS PRESENT     ABUNDANT GRAM POSITIVE COCCI IN PAIRS     IN  CLUSTERS IN CHAINS FEW GRAM  NEGATIVE COCCI     MODERATE GRAM NEGATIVE RODS   Culture     Final   Value: NO ANAEROBES ISOLATED     Performed at [facility]   Report Status 10/27/2013 FINAL   Final  WOUND CULTURE     Status: None   Collection Time    10/22/13  6:46 AM      Result Value Ref Range Status   Specimen Description WOUND BREAST RIGHT   Final   Special Requests NONE   Final   Gram Stain     Final   Value: MODERATE WBC PRESENT, PREDOMINANTLY PMN     RARE SQUAMOUS EPITHELIAL CELLS PRESENT     ABUNDANT GRAM POSITIVE COCCI IN PAIRS     IN CHAINS FEW GRAM POSITIVE RODS     FEW GRAM NEGATIVE RODS   Culture     Final   Value: MODERATE GROUP B STREP(S.AGALACTIAE)ISOLATED     Note: TESTING AGAINST S. AGALACTIAE NOT ROUTINELY PERFORMED DUE TO PREDICTABILITY OF AMP/PEN/VAN SUSCEPTIBILITY.     Performed at [facility]   Report Status 10/25/2013 FINAL   Final  SURGICAL PCR SCREEN     Status: Abnormal   Collection Time    10/24/13  8:51 AM      Result Value Ref Range Status   MRSA, PCR POSITIVE (*) NEGATIVE Final   Staphylococcus aureus POSITIVE (*) NEGATIVE Final   Comment:            The Xpert SA Assay (FDA     approved for NASAL specimens     in patients over 48 years of age),     is one component of     a comprehensive surveillance     program.  Test performance has     been validated by [employer] for patients greater     than or equal to 82 year old.     It is not intended     to diagnose infection nor to     guide or monitor treatment.  URINE CULTURE     Status: None   Collection Time    10/25/13  7:41 PM      Result Value Ref Range Status   Specimen Description URINE, RANDOM   Final   Special Requests NONE   Final   Culture  Setup Time     Final   Value: 10/26/2013 00:49     Performed at [place]     Final   Value: 30,000 COLONIES/ML     Performed at [facility]   Culture     Final   Value: Multiple  bacterial morphotypes present, none predominant. Suggest appropriate recollection if clinically indicated.     Performed at [facility]   Report Status 10/26/2013 FINAL   Final     Scheduled Meds: . clindamycin (CLEOCIN) IV  600 mg Intravenous 4 times per day  . enoxaparin (LOVENOX) injection  40 mg Subcutaneous Q24H  . insulin aspart  0-20 Units Subcutaneous TID WC  . insulin aspart  0-5 Units Subcutaneous QHS  . insulin glargine  14 Units Subcutaneous Daily  . sodium chloride  3 mL Intravenous Q12H  . vancomycin  1,000 mg Intravenous Q24H   Continuous Infusions:  Theodis Blaze, MD  [facility] Pager [phone]  If 7PM-7AM, please contact night-coverage [email or web address] Password [email or web address] 10/30/2013, 12:15 PM   LOS: 8 days

## 2013-10-31 LAB — GLUCOSE, CAPILLARY
GLUCOSE-CAPILLARY: 96 mg/dL (ref 70–99)
GLUCOSE-CAPILLARY: 103 mg/dL — AB (ref 70–99)
Glucose-Capillary: 146 mg/dL — ABNORMAL HIGH (ref 70–99)
Glucose-Capillary: 153 mg/dL — ABNORMAL HIGH (ref 70–99)

## 2013-10-31 LAB — CBC
HCT: 24 % — ABNORMAL LOW (ref 36.0–46.0)
Hemoglobin: 7.8 g/dL — ABNORMAL LOW (ref 12.0–15.0)
MCH: 28.7 pg (ref 26.0–34.0)
MCHC: 32.5 g/dL (ref 30.0–36.0)
MCV: 88.2 fL (ref 78.0–100.0)
PLATELETS: 725 10*3/uL — AB (ref 150–400)
RBC: 2.72 MIL/uL — ABNORMAL LOW (ref 3.87–5.11)
RDW: 14.4 % (ref 11.5–15.5)
WBC: 13.4 10*3/uL — ABNORMAL HIGH (ref 4.0–10.5)

## 2013-10-31 LAB — BASIC METABOLIC PANEL
BUN: 16 mg/dL (ref 6–23)
CO2: 24 mEq/L (ref 19–32)
CREATININE: 1.74 mg/dL — AB (ref 0.50–1.10)
Calcium: 8.4 mg/dL (ref 8.4–10.5)
Chloride: 101 mEq/L (ref 96–112)
GFR calc non Af Amer: 31 mL/min — ABNORMAL LOW (ref >90)
GFR, EST AFRICAN AMERICAN: 36 mL/min — AB (ref >90)
Glucose, Bld: 131 mg/dL — ABNORMAL HIGH (ref 70–99)
Potassium: 4.2 mEq/L (ref 3.7–5.3)
Sodium: 138 mEq/L (ref 137–147)

## 2013-10-31 LAB — VANCOMYCIN, RANDOM: VANCOMYCIN RM: 25.3 ug/mL

## 2013-10-31 MED ORDER — SULFAMETHOXAZOLE-TMP DS 800-160 MG PO TABS
1.0000 | ORAL_TABLET | Freq: Two times a day (BID) | ORAL | Status: DC
  Administered 2013-10-31 – 2013-11-02 (×5): 1 via ORAL
  Filled 2013-10-31 (×6): qty 1

## 2013-10-31 MED ORDER — SODIUM CHLORIDE 0.9 % IV SOLN
INTRAVENOUS | Status: AC
  Administered 2013-10-31: 17:00:00 via INTRAVENOUS

## 2013-10-31 NOTE — Progress Notes (Signed)
Lower flap dark, but still viable Upper flap with slight erythema  Very tight suture line  PO abx Hopefully home soon with outpatient Oncology treatment.  Imogene Burn. Georgette Dover, MD, [facility]  General/ Trauma Surgery  10/31/2013 1:43 PM

## 2013-10-31 NOTE — Progress Notes (Signed)
Teaching regarding insulin administration done with this patient and patient able to show this writer how to draw insulin and give insulin to herself. Able to verbalize how to check blood sugar level and the sites of insulin . Video is not available at this time.

## 2013-10-31 NOTE — Progress Notes (Signed)
OT Discharge Note  Patient Details Name: Carmen Stone MRN: [id number] DOB: [birth year]   Cancelled Treatment:    Reason Eval/Treat Not Completed: OT screened, no needs identified, will sign off (spoke with PT Santiago Glad - no acute needs)  Peri Maris Pager: [phone]  10/31/2013, 2:57 PM

## 2013-10-31 NOTE — Progress Notes (Signed)
PT Cancellation Note  Patient Details Name: Carmen Stone MRN: [id number] DOB: [birth year]   Cancelled Treatment:    Reason Eval/Treat Not Completed: PT screened, no needs identified, will sign off (PT eval'd 5/15, pt remains ambulatory and at baseline)   Claretha Cooper 10/31/2013, 2:05 PM Tresa Endo PT [phone]

## 2013-10-31 NOTE — Progress Notes (Signed)
Patient ID: Carmen Stone, female   DOB: 1956/02/15, 58 y.o.   MRN: [id number]  [facility] PROGRESS NOTE  Carmen Stone YPP:[id number] DOB: June 09, 1956 DOA: 10/22/2013 PCP: [name], MD  Brief narrative:  57 y.o. female, with history of type 2 diabetes mellitus who is on Glucophage, no other known medical problems, was admitted by general surgery service for right breast drainage due to right breast abscess, she was taken to the OR on 10/24/2013 for incision and drainage, [facility] was consulted to manage diabetes.   Assessment/Plan:  DIABETES MELLITUS, TYPE II  - moderate control in setting of necrotizing infection  - CBG remain in 120's - 180's over the past 24 hours, which is reasonably well controlled  - cont current Lantus and SSI  - poor control pre admit: HgbA1c 11.8  Acute renal failure  - Cr trending up over the past 48 hours, FENa is 1.3% so possibly from early ATN in the setting of Vanc   - we may see continued increase in Cr over the next several days before it starts turning around  - continue IVF, gentle hydration and repeat BMP in AM - Vancomycin discontinued  Acute respiratory failure with hypoxia  - CXR unremarkable  - resolved  Postoperative anemia due to acute blood loss  - baseline hgb ~9.0  - transfused 2 units on 5/12 since SBP 90s and c/w sx's-5/13 Hgb up to 9.8 --> 9.2 --> 8.6 --> 8.3 --> 7.8 - repeat CBC in AM, transfuse ifHg < 7.5  Abnormal urinalysis  - appears c/w UTI  - urine culture from 5/12 With contamination, 30K of multiple morphotypes  - pt denies any specific urinary concerns and since already on ABX no need to repeat urine culture unless pt is symptomatic  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   Objective: Filed Vitals:   10/30/13 0507 10/30/13 1411 10/30/13 2238 10/31/13 0625  BP: 109/54 116/50 127/59 116/56  Pulse: 79 86 81 72  Temp: 98 F (36.7 C) 98.3 F (36.8 C) 98.7 F (37.1 C) 98.1 F (36.7 C)  TempSrc: Oral Oral Oral   Resp: 16 16 17 16   Height:      Weight:      SpO2: 96% 97% 97% 96%    Intake/Output Summary (Last 24 hours) at 10/31/13 0857 Last data filed at 10/31/13 4580  Gross per 24 hour  Intake    720 ml  Output   1250 ml  Net   -530 ml    Exam:   General:  Pt is alert, follows commands appropriately, not in acute distress  Cardiovascular: Regular rate and rhythm, S1/S2, no murmurs, no rubs, no gallops  Respiratory: Clear to auscultation bilaterally, no wheezing, no crackles, no rhonchi  Abdomen: Soft, non tender, non distended, bowel sounds present, no guarding  Data Reviewed: Basic Metabolic Panel:  Recent Labs Lab 10/27/13 0331 10/28/13 0350  10/29/13 0610 10/30/13 1014 10/31/13 0505  NA 140 138 138 138  138 138  K 3.8 3.8 4.3 4.0  4.2 4.2  CL 101 98 100 99  99 101  CO2 26 27 27 26  23 24   GLUCOSE 155* 119* 109* 178*  175* 131*  BUN 7 11 15 15  15 16   CREATININE 0.67 0.97 1.47* 1.67*  1.72* 1.74*  CALCIUM 8.2* 8.4 8.5 8.7  8.7 8.4   Liver Function Tests:  Recent Labs Lab 10/30/13 1014  AST 20  ALT 6  ALKPHOS 70  BILITOT <0.2*  PROT 6.5  ALBUMIN 2.2*   CBC:  Recent Labs Lab 10/27/13 0331 10/28/13 0350 10/29/13 1140 10/30/13 1014 10/31/13 0505  WBC 19.2* 16.4* 17.1* 14.9* 13.4*  HGB 9.2* 8.9* 8.6* 8.3* 7.8*  HCT 27.8* 26.4* 26.0* 25.1* 24.0*   MCV 84.5 86.8 86.4 87.2 88.2  PLT 502* 582* 703* 729* 725*   CBG:  Recent Labs Lab 10/30/13 0726 10/30/13 1151 10/30/13 1657 10/30/13 2313 10/31/13 0803  GLUCAP 114* 186* 116* 106* 96    Recent Results (from the past 240 hour(s))  CULTURE, BLOOD (ROUTINE X 2)     Status: None   Collection Time    10/22/13  3:03 AM      Result Value Ref Range Status   Specimen Description BLOOD LEFT ANTECUBITAL   Final   Special Requests BOTTLES DRAWN AEROBIC AND ANAEROBIC 10CC EACH   Final   Culture  Setup Time     Final   Value: 10/22/2013 12:09     Performed at [facility]   Culture     Final   Value: NO GROWTH 5 DAYS     Performed at [facility]   Report Status 10/28/2013 FINAL   Final  CULTURE, BLOOD (ROUTINE X 2)     Status: None   Collection Time    10/22/13  3:08 AM      Result Value Ref Range Status   Specimen Description BLOOD RIGHT ANTECUBITAL   Final   Special Requests BOTTLES DRAWN AEROBIC ONLY 10CC   Final   Culture  Setup Time     Final   Value: 10/22/2013 12:09     Performed at [facility]   Culture     Final   Value: NO GROWTH 5 DAYS     Performed at [facility]   Report Status 10/28/2013 FINAL   Final  WOUND CULTURE     Status: None   Collection Time    10/22/13  4:35 AM      Result Value Ref Range Status   Specimen Description WOUND   Final   Special Requests BREAST   Final   Gram Stain     Final   Value: MODERATE WBC PRESENT, PREDOMINANTLY PMN     RARE SQUAMOUS EPITHELIAL CELLS PRESENT     FEW GRAM NEGATIVE RODS     FEW GRAM POSITIVE RODS     MODERATE GRAM POSITIVE COCCI IN PAIRS   Culture     Final   Value: MODERATE GROUP B STREP(S.AGALACTIAE)ISOLATED     Note: TESTING AGAINST S. AGALACTIAE NOT ROUTINELY PERFORMED DUE TO PREDICTABILITY OF AMP/PEN/VAN SUSCEPTIBILITY.     Performed at [facility]   Report Status 10/24/2013 FINAL   Final  ANAEROBIC CULTURE     Status: None   Collection Time    10/22/13  6:46  AM      Result Value Ref  Range Status   Specimen Description WOUND BREAST RIGHT   Final   Special Requests NONE   Final   Gram Stain     Final   Value: ABUNDANT WBC PRESENT, PREDOMINANTLY PMN     FEW SQUAMOUS EPITHELIAL CELLS PRESENT     ABUNDANT GRAM POSITIVE COCCI IN PAIRS     IN CLUSTERS IN CHAINS FEW GRAM NEGATIVE COCCI     MODERATE GRAM NEGATIVE RODS   Culture     Final   Value: NO ANAEROBES ISOLATED     Performed at [facility]   Report Status 10/27/2013 FINAL   Final  WOUND CULTURE     Status: None   Collection Time    10/22/13  6:46 AM      Result Value Ref Range Status   Specimen Description WOUND BREAST RIGHT   Final   Special Requests NONE   Final   Gram Stain     Final   Value: MODERATE WBC PRESENT, PREDOMINANTLY PMN     RARE SQUAMOUS EPITHELIAL CELLS PRESENT     ABUNDANT GRAM POSITIVE COCCI IN PAIRS     IN CHAINS FEW GRAM POSITIVE RODS     FEW GRAM NEGATIVE RODS   Culture     Final   Value: MODERATE GROUP B STREP(S.AGALACTIAE)ISOLATED     Note: TESTING AGAINST S. AGALACTIAE NOT ROUTINELY PERFORMED DUE TO PREDICTABILITY OF AMP/PEN/VAN SUSCEPTIBILITY.     Performed at [facility]   Report Status 10/25/2013 FINAL   Final  SURGICAL PCR SCREEN     Status: Abnormal   Collection Time    10/24/13  8:51 AM      Result Value Ref Range Status   MRSA, PCR POSITIVE (*) NEGATIVE Final   Staphylococcus aureus POSITIVE (*) NEGATIVE Final   Comment:            The Xpert SA Assay (FDA     approved for NASAL specimens     in patients over 86 years of age),     is one component of     a comprehensive surveillance     program.  Test performance has     been validated by [employer] for patients greater     than or equal to 85 year old.     It is not intended     to diagnose infection nor to     guide or monitor treatment.  URINE CULTURE     Status: None   Collection Time    10/25/13  7:41 PM      Result Value Ref Range Status   Specimen Description  URINE, RANDOM   Final   Special Requests NONE   Final   Culture  Setup Time     Final   Value: 10/26/2013 00:49     Performed at [facility]     Final   Value: 30,000 COLONIES/ML     Performed at [facility]   Culture     Final   Value: Multiple bacterial morphotypes present, none predominant. Suggest appropriate recollection if clinically indicated.     Performed at [facility]   Report Status 10/26/2013 FINAL   Final     Scheduled Meds: . clindamycin (CLEOCIN) IV  600 mg Intravenous 4 times per day  . enoxaparin (LOVENOX) injection  40 mg Subcutaneous Q24H  . insulin aspart  0-20 Units Subcutaneous TID WC  . insulin  aspart  0-5 Units Subcutaneous QHS  . insulin glargine  14 Units Subcutaneous Daily  . sodium chloride  3 mL Intravenous Q12H   Continuous Infusions:  Theodis Blaze, MD  [facility] Pager [phone]  If 7PM-7AM, please contact night-coverage [email or web address] Password [email or web address] 10/31/2013, 8:57 AM   LOS: 9 days

## 2013-10-31 NOTE — Progress Notes (Signed)
Patient ID: Carmen Stone, female   DOB: 12-14-1955, 58 y.o.   MRN: [id number] 5 Days Post-Op  Subjective: Pt feels well this morning.  Minimal pain.  Eating well.  Voiding well.  Objective: Vital signs in last 24 hours: Temp:  [98.1 F (36.7 C)-98.7 F (37.1 C)] 98.1 F (36.7 C) (05/18 0625) Pulse Rate:  [72-86] 72 (05/18 0625) Resp:  [16-17] 16 (05/18 0625) BP: (116-127)/(50-59) 116/56 mmHg (05/18 0625) SpO2:  [96 %-97 %] 96 % (05/18 0625) Last BM Date: 10/26/13  Intake/Output from previous day: 05/17 0701 - 05/18 0700 In: 720 [P.O.:720] Out: 1250 [Urine:1100; Drains:150] Intake/Output this shift:    PE: Chest: breast binder in place.  Drains in place with serous drainage.  Wound intact  Lab Results:   Recent Labs  10/30/13 1014 10/31/13 0505  WBC 14.9* 13.4*  HGB 8.3* 7.8*  HCT 25.1* 24.0*  PLT 729* 725*   BMET  Recent Labs  10/30/13 1014 10/31/13 0505  NA 138  138 138  K 4.0  4.2 4.2  CL 99  99 101  CO2 26  23 24   GLUCOSE 178*  175* 131*  BUN 15  15 16   CREATININE 1.67*  1.72* 1.74*  CALCIUM 8.7  8.7 8.4   PT/INR No results found for this basename: LABPROT, INR,  in the last 72 hours CMP     Component Value Date/Time   NA 138 10/31/2013 0505   K 4.2 10/31/2013 0505   CL 101 10/31/2013 0505   CO2 24 10/31/2013 0505   GLUCOSE 131* 10/31/2013 0505   BUN 16 10/31/2013 0505   CREATININE 1.74* 10/31/2013 0505   CALCIUM 8.4 10/31/2013 0505   PROT 6.5 10/30/2013 1014   ALBUMIN 2.2* 10/30/2013 1014   AST 20 10/30/2013 1014   ALT 6 10/30/2013 1014   ALKPHOS 70 10/30/2013 1014   BILITOT <0.2* 10/30/2013 1014   GFRNONAA 31* 10/31/2013 0505   GFRAA 36* 10/31/2013 0505   Lipase     Component Value Date/Time   LIPASE 16 06/08/2010 0430       Studies/Results: No results found.  Anti-infectives: Anti-infectives   Start     Dose/Rate Route Frequency Ordered Stop   10/30/13 1000  vancomycin (VANCOCIN) IVPB 1000 mg/200 mL premix  Status:   Discontinued     1,000 mg 200 mL/hr over 60 Minutes Intravenous Every 24 hours 10/29/13 1102 10/30/13 1316   10/22/13 1215  clindamycin (CLEOCIN) IVPB 600 mg     600 mg 100 mL/hr over 30 Minutes Intravenous 4 times per day 10/22/13 1136     10/22/13 1215  vancomycin (VANCOCIN) IVPB 1000 mg/200 mL premix  Status:  Discontinued     1,000 mg 200 mL/hr over 60 Minutes Intravenous Every 12 hours 10/22/13 1142 10/29/13 1102   10/22/13 0430  ceFEPIme (MAXIPIME) 1 g in dextrose 5 % 50 mL IVPB     1 g 100 mL/hr over 30 Minutes Intravenous  Once 10/22/13 0423 10/22/13 0552   10/22/13 0430  vancomycin (VANCOCIN) IVPB 1000 mg/200 mL premix  Status:  Discontinued     1,000 mg 200 mL/hr over 60 Minutes Intravenous  Once 10/22/13 0423 10/22/13 1142   10/22/13 0430  metroNIDAZOLE (FLAGYL) IVPB 500 mg  Status:  Discontinued     500 mg 100 mL/hr over 60 Minutes Intravenous  Once 10/22/13 0423 10/22/13 0424   10/22/13 0430  [MAR Hold]  clindamycin (CLEOCIN) IVPB 900 mg     (On MAR Hold since  10/22/13 0615)   900 mg 100 mL/hr over 30 Minutes Intravenous  Once 10/22/13 0424 10/30/13 2103       Assessment/Plan s/p Procedure(s):  COMPLETION MASTECTOMY, AXILLARY DISSECTION (Right)  POD #8/6/4 for what is eventual 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 1.74 today from 1.67 yesterday  Plan:  1. PO pain meds with IV backup  2. Reg diabetic diet  3. Glucose better controlled, but the nurse is very concerned that she will not be able to administer lantus because of poor vision  4. Will dc Vanc today secondary to presumed ATN from vancomycin.  Will dc clinda too and transition to oral Bactrim DS BID.  Daily BMETs to follow Cr.  Appreciate medicine assistance Hopefully home this week, pending her Creatinine.     LOS: 9 days    [name] 10/31/2013, 9:14 AM Pager: [phone]

## 2013-11-01 LAB — GLUCOSE, CAPILLARY
Glucose-Capillary: 126 mg/dL — ABNORMAL HIGH (ref 70–99)
Glucose-Capillary: 116 mg/dL — ABNORMAL HIGH (ref 70–99)
Glucose-Capillary: 140 mg/dL — ABNORMAL HIGH (ref 70–99)
Glucose-Capillary: 109 mg/dL — ABNORMAL HIGH (ref 70–99)

## 2013-11-01 LAB — CBC
HCT: 23.7 % — ABNORMAL LOW (ref 36.0–46.0)
Hemoglobin: 7.7 g/dL — ABNORMAL LOW (ref 12.0–15.0)
MCH: 28.4 pg (ref 26.0–34.0)
MCHC: 32.5 g/dL (ref 30.0–36.0)
MCV: 87.5 fL (ref 78.0–100.0)
Platelets: 709 10*3/uL — ABNORMAL HIGH (ref 150–400)
RBC: 2.71 MIL/uL — ABNORMAL LOW (ref 3.87–5.11)
RDW: 14.3 % (ref 11.5–15.5)
WBC: 11.9 10*3/uL — ABNORMAL HIGH (ref 4.0–10.5)

## 2013-11-01 LAB — BASIC METABOLIC PANEL
BUN: 18 mg/dL (ref 6–23)
CO2: 26 meq/L (ref 19–32)
Calcium: 8.6 mg/dL (ref 8.4–10.5)
Chloride: 101 mEq/L (ref 96–112)
Creatinine, Ser: 1.82 mg/dL — ABNORMAL HIGH (ref 0.50–1.10)
GFR calc Af Amer: 34 mL/min — ABNORMAL LOW (ref >90)
GFR, EST NON AFRICAN AMERICAN: 29 mL/min — AB (ref >90)
Glucose, Bld: 127 mg/dL — ABNORMAL HIGH (ref 70–99)
Potassium: 4.2 mEq/L (ref 3.7–5.3)
Sodium: 139 mEq/L (ref 137–147)

## 2013-11-01 NOTE — Progress Notes (Signed)
Inpatient Diabetes Program Recommendations  AACE/ADA: New Consensus Statement on Inpatient Glycemic Control (2013)  Target Ranges:  Prepandial:   less than 140 mg/dL      Peak postprandial:   less than 180 mg/dL (1-2 hours)      Critically ill patients:  140 - 180 mg/dL     Received call from Baxter Flattery, [place] caring for patient today.  RN concerned that patient is lacking in education for DM.  Informed RN that I had personally spent time with patient and her daughter reviewing basic DM survival skills and basic DM care for home.  RNs have been continuously working with patient with insulin instruction.  RN to allow patient to self inject insulin with meal at supper tonight.  Will follow up with patient in AM to see what other DM education needs patient has before d/c.   Will follow Wyn Quaker RN, MSN, CDE Diabetes Coordinator Inpatient Diabetes Program Team Pager: [phone] (8a-10p)

## 2013-11-01 NOTE — Progress Notes (Signed)
Patient ID: Carmen Stone, female   DOB: [birth year], 58 y.o.   MRN: [id number]  [facility] PROGRESS NOTE  WYVONNE CARDA CBS:[id number] DOB: 15-Apr-1956 DOA: 10/22/2013 PCP: [name], MD  Brief narrative:  58 y.o. female, with history of type 2 diabetes mellitus who is on Glucophage, no other known medical problems, was admitted by general surgery service for right breast drainage due to right breast abscess, she was taken to the OR on 10/24/2013 for incision and drainage, [facility] was consulted to manage diabetes.   Assessment/Plan:  DIABETES MELLITUS, TYPE II  - moderate control in setting of necrotizing infection  - CBG remain in 120's over the past 24 hours, which is reasonably well controlled  - cont current Lantus and SSI  - poor control pre admit: HgbA1c 11.8  Acute renal failure  - Cr trending up over the past 48 hours, FENa is 1.3% so possibly from early ATN in the setting of Vanc  - we may see continued increase in Cr over the next several days before it starts turning around  - no need for additional IVF as pt has been on NS for 72 hours, she is tolerating current diet well   - Vancomycin discontinued  Acute respiratory failure with hypoxia  - CXR unremarkable  - resolved  Postoperative anemia due to acute blood loss  - baseline hgb ~9.0  - transfused 2 units on 5/12 since SBP 90s and c/w sx's-5/13 Hgb up to 9.8 --> 9.2 --> 8.6 --> 8.3 --> 7.8 --> 7.7 - repeat CBC in AM, transfuse ifHg < 7.5  Abnormal urinalysis  - appears c/w UTI  - urine culture from 5/12 With contamination, 30K of multiple morphotypes  - pt denies any specific urinary concerns and since already on ABX no need to repeat urine culture unless pt is symptomatic  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   Objective: Filed Vitals:   10/31/13 1442 10/31/13 2033 11/01/13 0610 11/01/13 1351  BP: 132/75 131/77 132/68 126/64  Pulse: 67 83 81 83  Temp: 98.4 F (36.9 C) 98.7 F (37.1 C) 98.4 F (36.9 C) 98.6 F (37 C)  TempSrc: Oral Oral Oral Oral  Resp: 18 18 16 16   Height:      Weight:      SpO2: 97% 97% 94% 97%    Intake/Output Summary (Last 24 hours) at 11/01/13 1441 Last data filed at 11/01/13 1355  Gross per 24 hour  Intake    363 ml  Output    305 ml  Net     58 ml    Exam:   General:  Pt is alert, follows commands appropriately, not in acute distress  Cardiovascular: Regular rate and rhythm, S1/S2, no murmurs, no rubs, no gallops  Respiratory: Clear to auscultation bilaterally, no wheezing, no crackles, no rhonchi  Abdomen: Soft, non tender, non distended, bowel  sounds present, no guarding  Data Reviewed: Basic Metabolic Panel:  Recent Labs Lab 10/28/13 0350 10/29/13 0610 10/30/13 1014 10/31/13 0505 11/01/13 0653  NA 138 138 138  138 138 139  K 3.8 4.3 4.0  4.2 4.2 4.2  CL 98 100 99  99 101 101  CO2 27 27 26  23 24 26   GLUCOSE 119* 109* 178*  175* 131* 127*  BUN 11 15 15  15 16 18   CREATININE 0.97 1.47* 1.67*  1.72* 1.74* 1.82*  CALCIUM 8.4 8.5 8.7  8.7 8.4 8.6   Liver Function Tests:  Recent Labs Lab 10/30/13 1014  AST 20  ALT 6  ALKPHOS 70  BILITOT <0.2*  PROT 6.5  ALBUMIN 2.2*   CBC:  Recent Labs Lab 10/28/13 0350 10/29/13 1140 10/30/13 1014 10/31/13 0505 11/01/13 0653  WBC 16.4* 17.1* 14.9* 13.4* 11.9*  HGB 8.9* 8.6* 8.3*  7.8* 7.7*  HCT 26.4* 26.0* 25.1* 24.0* 23.7*  MCV 86.8 86.4 87.2 88.2 87.5  PLT 582* 703* 729* 725* 709*   CBG:  Recent Labs Lab 10/31/13 1207 10/31/13 1701 10/31/13 2105 11/01/13 0736 11/01/13 1152  GLUCAP 146* 103* 153* 126* 116*    Recent Results (from the past 240 hour(s))  SURGICAL PCR SCREEN     Status: Abnormal   Collection Time    10/24/13  8:51 AM      Result Value Ref Range Status   MRSA, PCR POSITIVE (*) NEGATIVE Final   Staphylococcus aureus POSITIVE (*) NEGATIVE Final   Comment:            The Xpert SA Assay (FDA     approved for NASAL specimens     in patients over 1 years of age),     is one component of     a comprehensive surveillance     program.  Test performance has     been validated by [employer] for patients greater     than or equal to 18 year old.     It is not intended     to diagnose infection nor to     guide or monitor treatment.  URINE CULTURE     Status: None   Collection Time    10/25/13  7:41 PM      Result Value Ref Range Status   Specimen Description URINE, RANDOM   Final   Special Requests NONE   Final   Culture  Setup Time     Final   Value: 10/26/2013 00:49     Performed at [facility]     Final   Value: 30,000 COLONIES/ML     Performed at [facility]   Culture     Final   Value: Multiple bacterial morphotypes present, none predominant. Suggest appropriate recollection if clinically indicated.     Performed at [facility]   Report Status 10/26/2013 FINAL   Final     Scheduled Meds: . enoxaparin (LOVENOX) injection  40 mg Subcutaneous Q24H  . insulin aspart  0-20 Units Subcutaneous TID WC  . insulin aspart  0-5 Units Subcutaneous QHS  . insulin glargine  14 Units Subcutaneous Daily  . sodium chloride  3 mL Intravenous Q12H  . sulfamethoxazole-trimethoprim  1 tablet Oral Q12H   Continuous Infusions:  Theodis Blaze, MD  [facility] Pager [phone]  If 7PM-7AM, please contact  night-coverage [email or web address] Password [email or web address] 11/01/2013, 2:41 PM   LOS: 10 days

## 2013-11-01 NOTE — Progress Notes (Signed)
Doing well, but Creatinine continues to trend up. Have adjusted medications. Home soon.  Imogene Burn. Georgette Dover, MD, [facility]  General/ Trauma Surgery  11/01/2013 12:38 PM

## 2013-11-01 NOTE — Progress Notes (Signed)
Patient ID: Carmen Stone, female   DOB: June 07, 1956, 58 y.o.   MRN: [id number] 6 Days Post-Op  Subjective: Pt feels well today.  No complaints.  Pain minimal  Objective: Vital signs in last 24 hours: Temp:  [98.4 F (36.9 C)-98.7 F (37.1 C)] 98.4 F (36.9 C) (05/19 0610) Pulse Rate:  [67-83] 81 (05/19 0610) Resp:  [16-18] 16 (05/19 0610) BP: (131-132)/(68-77) 132/68 mmHg (05/19 0610) SpO2:  [94 %-97 %] 94 % (05/19 0610) Last BM Date: 10/28/13  Intake/Output from previous day: 05/18 0701 - 05/19 0700 In: 120 [P.O.:120] Out: 230 [Drains:230] Intake/Output this shift: Total I/O In: 360 [P.O.:360] Out: -   PE: Chest: wound is intact. Still closed with no evidence of leakage.    Lab Results:   Recent Labs  10/31/13 0505 11/01/13 0653  WBC 13.4* 11.9*  HGB 7.8* 7.7*  HCT 24.0* 23.7*  PLT 725* 709*   BMET  Recent Labs  10/31/13 0505 11/01/13 0653  NA 138 139  K 4.2 4.2  CL 101 101  CO2 24 26  GLUCOSE 131* 127*  BUN 16 18  CREATININE 1.74* 1.82*  CALCIUM 8.4 8.6   PT/INR No results found for this basename: LABPROT, INR,  in the last 72 hours CMP     Component Value Date/Time   NA 139 11/01/2013 0653   K 4.2 11/01/2013 0653   CL 101 11/01/2013 0653   CO2 26 11/01/2013 0653   GLUCOSE 127* 11/01/2013 0653   BUN 18 11/01/2013 0653   CREATININE 1.82* 11/01/2013 0653   CALCIUM 8.6 11/01/2013 0653   PROT 6.5 10/30/2013 1014   ALBUMIN 2.2* 10/30/2013 1014   AST 20 10/30/2013 1014   ALT 6 10/30/2013 1014   ALKPHOS 70 10/30/2013 1014   BILITOT <0.2* 10/30/2013 1014   GFRNONAA 29* 11/01/2013 0653   GFRAA 34* 11/01/2013 0653   Lipase     Component Value Date/Time   LIPASE 16 06/08/2010 0430       Studies/Results: No results found.  Anti-infectives: Anti-infectives   Start     Dose/Rate Route Frequency Ordered Stop   10/31/13 1000  sulfamethoxazole-trimethoprim (BACTRIM DS) 800-160 MG per tablet 1 tablet     1 tablet Oral Every 12 hours 10/31/13 0921     10/30/13 1000  vancomycin (VANCOCIN) IVPB 1000 mg/200 mL premix  Status:  Discontinued     1,000 mg 200 mL/hr over 60 Minutes Intravenous Every 24 hours 10/29/13 1102 10/30/13 1316   10/22/13 1215  clindamycin (CLEOCIN) IVPB 600 mg  Status:  Discontinued     600 mg 100 mL/hr over 30 Minutes Intravenous 4 times per day 10/22/13 1136 10/31/13 0921   10/22/13 1215  vancomycin (VANCOCIN) IVPB 1000 mg/200 mL premix  Status:  Discontinued     1,000 mg 200 mL/hr over 60 Minutes Intravenous Every 12 hours 10/22/13 1142 10/29/13 1102   10/22/13 0430  ceFEPIme (MAXIPIME) 1 g in dextrose 5 % 50 mL IVPB     1 g 100 mL/hr over 30 Minutes Intravenous  Once 10/22/13 0423 10/22/13 0552   10/22/13 0430  vancomycin (VANCOCIN) IVPB 1000 mg/200 mL premix  Status:  Discontinued     1,000 mg 200 mL/hr over 60 Minutes Intravenous  Once 10/22/13 0423 10/22/13 1142   10/22/13 0430  metroNIDAZOLE (FLAGYL) IVPB 500 mg  Status:  Discontinued     500 mg 100 mL/hr over 60 Minutes Intravenous  Once 10/22/13 0423 10/22/13 0424   10/22/13 0430  [MAR Hold]  clindamycin (CLEOCIN) IVPB 900 mg     (On MAR Hold since 10/22/13 0615)   900 mg 100 mL/hr over 30 Minutes Intravenous  Once 10/22/13 0424 10/30/13 2103       Assessment/Plan   s/p Procedure(s):  COMPLETION MASTECTOMY, AXILLARY DISSECTION (Right)  POD #9/7/5 for what is eventual 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 1.82 Plan:  1. PO pain meds  2. Reg diabetic diet  3. Glucose better controlled 4. Oral Bactrim 5. Appreciate medicine assistance with ARI Hopefully home this week, pending her Creatinine.   LOS: 10 days    [name] 11/01/2013, 10:13 AM Pager: [phone]

## 2013-11-02 ENCOUNTER — Encounter (HOSPITAL_COMMUNITY): Payer: Self-pay | Admitting: General Surgery

## 2013-11-02 DIAGNOSIS — C50919 Malignant neoplasm of unspecified site of unspecified female breast: Secondary | ICD-10-CM | POA: Diagnosis present

## 2013-11-02 DIAGNOSIS — J9600 Acute respiratory failure, unspecified whether with hypoxia or hypercapnia: Secondary | ICD-10-CM | POA: Diagnosis present

## 2013-11-02 DIAGNOSIS — N170 Acute kidney failure with tubular necrosis: Secondary | ICD-10-CM

## 2013-11-02 LAB — GLUCOSE, CAPILLARY
GLUCOSE-CAPILLARY: 103 mg/dL — AB (ref 70–99)
GLUCOSE-CAPILLARY: 88 mg/dL (ref 70–99)
Glucose-Capillary: 111 mg/dL — ABNORMAL HIGH (ref 70–99)
Glucose-Capillary: 91 mg/dL (ref 70–99)

## 2013-11-02 LAB — CBC
HCT: 25.8 % — ABNORMAL LOW (ref 36.0–46.0)
HEMOGLOBIN: 8.4 g/dL — AB (ref 12.0–15.0)
MCH: 29 pg (ref 26.0–34.0)
MCHC: 32.6 g/dL (ref 30.0–36.0)
MCV: 89 fL (ref 78.0–100.0)
Platelets: 735 10*3/uL — ABNORMAL HIGH (ref 150–400)
RBC: 2.9 MIL/uL — AB (ref 3.87–5.11)
RDW: 14.5 % (ref 11.5–15.5)
WBC: 9.1 10*3/uL (ref 4.0–10.5)

## 2013-11-02 LAB — BASIC METABOLIC PANEL
BUN: 19 mg/dL (ref 6–23)
CHLORIDE: 100 meq/L (ref 96–112)
CO2: 28 meq/L (ref 19–32)
CREATININE: 2.05 mg/dL — AB (ref 0.50–1.10)
Calcium: 9 mg/dL (ref 8.4–10.5)
GFR calc non Af Amer: 25 mL/min — ABNORMAL LOW (ref >90)
GFR, EST AFRICAN AMERICAN: 29 mL/min — AB (ref >90)
Glucose, Bld: 95 mg/dL (ref 70–99)
Potassium: 4.6 mEq/L (ref 3.7–5.3)
SODIUM: 139 meq/L (ref 137–147)

## 2013-11-02 MED ORDER — SODIUM CHLORIDE 0.9 % IV SOLN
INTRAVENOUS | Status: DC
  Administered 2013-11-02 – 2013-11-05 (×4): via INTRAVENOUS

## 2013-11-02 MED ORDER — FLEET ENEMA 7-19 GM/118ML RE ENEM
1.0000 | ENEMA | Freq: Once | RECTAL | Status: AC
  Administered 2013-11-02: 12:00:00 via RECTAL
  Filled 2013-11-02: qty 1

## 2013-11-02 MED ORDER — INSULIN GLARGINE 100 UNIT/ML ~~LOC~~ SOLN
10.0000 [IU] | Freq: Every day | SUBCUTANEOUS | Status: DC
  Administered 2013-11-03 – 2013-11-07 (×5): 10 [IU] via SUBCUTANEOUS
  Filled 2013-11-02 (×5): qty 0.1

## 2013-11-02 MED ORDER — CEFUROXIME AXETIL 500 MG PO TABS
500.0000 mg | ORAL_TABLET | Freq: Two times a day (BID) | ORAL | Status: DC
  Administered 2013-11-02 – 2013-11-07 (×10): 500 mg via ORAL
  Filled 2013-11-02 (×12): qty 1

## 2013-11-02 MED ORDER — LINAGLIPTIN 5 MG PO TABS
5.0000 mg | ORAL_TABLET | Freq: Every day | ORAL | Status: DC
  Administered 2013-11-02 – 2013-11-07 (×6): 5 mg via ORAL
  Filled 2013-11-02 (×6): qty 1

## 2013-11-02 NOTE — Clinical Social Work Note (Signed)
CSW received consult for possible SNF placement at time of discharge. Per chart review, pt currently with no discharge recommendations from PT. CSW signing off. Please re consult if discharge disposition changes.  Pati Gallo, [place] Social Worker [phone]

## 2013-11-02 NOTE — Progress Notes (Signed)
Renal function worsening.  No significant change in mastectomy site On Bactrim   Imogene Burn. Georgette Dover, MD, [facility] Surgery  General/ Trauma Surgery  11/02/2013 11:35 AM

## 2013-11-02 NOTE — Progress Notes (Deleted)
Physician Transfer Summary  Patient ID: Carmen Stone MRN: [id number] DOB/AGE: December 16, 1955 58 y.o.  Admit date: 10/22/2013 Transfer date: 11/02/2013 MD accepting transfer: Dr. Cristal Ford  Admitting Diagnosis: Right breast necrotizing soft tissue infection SIRS New onset Type 2 DM Poor historian Ventral incisional hernia  Current Diagnosis Patient Active Problem List   Diagnosis Date Noted  . 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 ATN (acute tubular necrosis) 11/02/2013  . Acute respiratory failure 11/02/2013  . Acute respiratory failure with hypoxia 10/25/2013  . Abnormal urinalysis 10/25/2013  . Postoperative anemia due to acute blood loss 10/24/2013  . Breast abscess of female 10/22/2013  . HYPERLIPIDEMIA 07/16/2010  . ATELECTASIS 07/16/2010  . DIABETES MELLITUS, TYPE II 06/24/2010  . 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 HYPOXEMIA 06/24/2010  . SMALL BOWEL OBSTRUCTION, HX OF 06/24/2010    Consultants Dr. Candiss Norse ([facility] hospitalist) Dr. Mart Piggs ([facility] hospitalist) Dr. Cristal Ford ([facility] hospitalist) Diabetes nurse educator   Pathology (10/26/13): 1. Breast, simple mastectomy, Right - INVASIVE DUCTAL CARCINOMA, GRADE III/III. - LYMPHOVASCULAR INVASION IS IDENTIFIED. - CARCINOMA IS PRESENT AT NON ORIENTED INKED TISSUE EDGE(S). - SEE ONCOLOGY TABLE BELOW. 2. Lymph nodes, regional resection, Right axilla - METASTATIC CARCINOMA IN 2 OF 16 LYMPH NODES (2/16).   Procedures Dr. [name] (10/26/13) - Completion right mastectomy, right axillary node dissection Dr. [name] (10/24/13) - Right total mastectomy Dr. Redmond Pulling (10/22/13) - I&D and debridement of right breast wound with breast biopsy  Procedures  Hospital Course:  58 yo AAF who states she was in her usual state of health until this past Monday when she noticed some right breast swelling and pain. Over the past several days the area has worsened and started draining fluid. She denies any trauma to area. She  states that her right breast appeared the same as the left breast just until this past Monday.  She denies any PMH, but when I told her chart documents her being a diabetic - she states she was unaware of this.  Last mammogram was 5 yrs ago. She denies any family hx of breast ca or other malignancy. She denies any wt loss, LAD, nipple drainage.   Workup seemed to be consistent with a necrotizing soft tissue infection and SIRS.  Patient was admitted and underwent procedures listed above.  After multiple surgeries she ultimately underwent a completion mastectomy and right axillary lymph node dissection.  She was found to have inflammatory breast cancer with 2/16 lymph nodes positive.  Tolerated procedures well and was transferred to the ICU due to acute respiratory failure/SIRS.  She remained in the ICU for a few days prior to being transferred to the surgical floor.  After surgery her WBC and fevers started to trend down.  She was continued on IV antibiotics (clindamycin and vancomycin).  Her blood sugars remained elevated, Dr. Candiss Norse of [facility] and his associates were consulted on the patient regarding her new onset diabetes with a Hgb of 11.8.  On 10/29/13 her creatinine jumped from 0.97 to 1.47.  It continued to trend up to 2.05 on 11/02/13.  The hospitalist team was asked to help manage this as well.  It was thought to be due to the vancomycin related ATN.  IV antibiotics were stopped and she was placed on oral Bactrim which was ultimately switched to ceftin 500mg  BID.  Diet was advanced as tolerated.  On POD #11/9/7 (11/02/13, the patient was voiding well, tolerating diet, ambulating well, pain well controlled, vital signs stable, incisions site clean, dry and  intact.  The patient is accepted in transfer to Dr. Aldine Contes service.  We will continue to follow along as a Optometrist.  Patient will follow up in our office in 2-3 weeks after discharge with Dr. [name] and knows to call with questions or  concerns.       Signed: Coralie Keens, [facility] [phone] Pager: [phone]  11/02/2013, 11:10 AM

## 2013-11-02 NOTE — Progress Notes (Signed)
7 Days Post-Op  Subjective: Pt doing well, minimal pain.  Dressing changes going well.  Incision still closed.  Drains with serosanguinous drainage.  Feels a bit crampy today in her abdomen.  No N/V tolerating diet well.    Objective: Vital signs in last 24 hours: Temp:  [98.5 F (36.9 C)-98.9 F (37.2 C)] 98.5 F (36.9 C) (05/20 0516) Pulse Rate:  [72-85] 72 (05/20 0516) Resp:  [16] 16 (05/20 0516) BP: (119-126)/(51-64) 119/51 mmHg (05/20 0516) SpO2:  [93 %-97 %] 93 % (05/20 0516) Last BM Date: 10/28/13  Intake/Output from previous day: 05/19 0701 - 05/20 0700 In: 963 [P.O.:960; I.V.:3] Out: 153 [Drains:153] Intake/Output this shift:    PE: Gen:  Alert, NAD, pleasant Right breast: Incision with serosanguinous drainage and intact, no dehiscence, darkened areas at the inferior and lateral aspect of the incisions, minimal tenderness to palpation, drains with serous drainage to both 174mL/output 24 hours Abd: Soft, NT/ND, +BS   Lab Results:   Recent Labs  11/01/13 0653 11/02/13 0550  WBC 11.9* 9.1  HGB 7.7* 8.4*  HCT 23.7* 25.8*  PLT 709* 735*   BMET  Recent Labs  11/01/13 0653 11/02/13 0550  NA 139 139  K 4.2 4.6  CL 101 100  CO2 26 28  GLUCOSE 127* 95  BUN 18 19  CREATININE 1.82* 2.05*  CALCIUM 8.6 9.0   PT/INR No results found for this basename: LABPROT, INR,  in the last 72 hours CMP     Component Value Date/Time   NA 139 11/02/2013 0550   K 4.6 11/02/2013 0550   CL 100 11/02/2013 0550   CO2 28 11/02/2013 0550   GLUCOSE 95 11/02/2013 0550   BUN 19 11/02/2013 0550   CREATININE 2.05* 11/02/2013 0550   CALCIUM 9.0 11/02/2013 0550   PROT 6.5 10/30/2013 1014   ALBUMIN 2.2* 10/30/2013 1014   AST 20 10/30/2013 1014   ALT 6 10/30/2013 1014   ALKPHOS 70 10/30/2013 1014   BILITOT <0.2* 10/30/2013 1014   GFRNONAA 25* 11/02/2013 0550   GFRAA 29* 11/02/2013 0550   Lipase     Component Value Date/Time   LIPASE 16 06/08/2010 0430       Studies/Results: No  results found.  Anti-infectives: Anti-infectives   Start     Dose/Rate Route Frequency Ordered Stop   10/31/13 1000  sulfamethoxazole-trimethoprim (BACTRIM DS) 800-160 MG per tablet 1 tablet     1 tablet Oral Every 12 hours 10/31/13 0921     10/30/13 1000  vancomycin (VANCOCIN) IVPB 1000 mg/200 mL premix  Status:  Discontinued     1,000 mg 200 mL/hr over 60 Minutes Intravenous Every 24 hours 10/29/13 1102 10/30/13 1316   10/22/13 1215  clindamycin (CLEOCIN) IVPB 600 mg  Status:  Discontinued     600 mg 100 mL/hr over 30 Minutes Intravenous 4 times per day 10/22/13 1136 10/31/13 0921   10/22/13 1215  vancomycin (VANCOCIN) IVPB 1000 mg/200 mL premix  Status:  Discontinued     1,000 mg 200 mL/hr over 60 Minutes Intravenous Every 12 hours 10/22/13 1142 10/29/13 1102   10/22/13 0430  ceFEPIme (MAXIPIME) 1 g in dextrose 5 % 50 mL IVPB     1 g 100 mL/hr over 30 Minutes Intravenous  Once 10/22/13 0423 10/22/13 0552   10/22/13 0430  vancomycin (VANCOCIN) IVPB 1000 mg/200 mL premix  Status:  Discontinued     1,000 mg 200 mL/hr over 60 Minutes Intravenous  Once 10/22/13 0423 10/22/13 1142  10/22/13 0430  metroNIDAZOLE (FLAGYL) IVPB 500 mg  Status:  Discontinued     500 mg 100 mL/hr over 60 Minutes Intravenous  Once 10/22/13 0423 10/22/13 0424   10/22/13 0430  [MAR Hold]  clindamycin (CLEOCIN) IVPB 900 mg     (On MAR Hold since 10/22/13 0615)   900 mg 100 mL/hr over 30 Minutes Intravenous  Once 10/22/13 0424 10/30/13 2103       Assessment/Plan POD #11/9/7 for what is eventual right modified radical mastectomy for NSTI and inflammatory breast cancer  Leukocytosis - normal ABL Anemia - 8.4 - improved ARI - Cr. 2.05 - up today  Plan:  1. PO pain meds  2. Reg diabetic diet  3. Glucose better controlled  4. Oral Bactrim, vanco stopped 5. No issues from a surgical perspective.  Reason for staying is strictly medical at this point (renal failure and diabetes control).  Dr. Ree Kida has agreed  to take her on their service, will write transfer summary.  We will continue to follow.    Hopefully home this week    LOS: 11 days    Coralie Keens 11/02/2013, 9:17 AM Pager: [phone]

## 2013-11-02 NOTE — Progress Notes (Signed)
Pt able to do CBG test herself last night.

## 2013-11-02 NOTE — Progress Notes (Signed)
Inpatient Diabetes Program Recommendations  AACE/ADA: New Consensus Statement on Inpatient Glycemic Control (2013)  Target Ranges:  Prepandial:   less than 140 mg/dL      Peak postprandial:   less than 180 mg/dL (1-2 hours)      Critically ill patients:  140 - 180 mg/dL   Call received from Dr. Ree Kida.  She requested recommendations regarding d/c regimen.  CBG's are well controlled on Lantus 14 units daily and correction Novolog.  She states that creatinine is elevated.  Recommended decreasing Lantus to 10 units daily and adding Tradjenta 5 mg daily.  CBG's are currently well controlled.  She may be interested in insulin pen instead of vial and syringe.  Will follow.  Adah Perl, RN, BC-ADM Inpatient Diabetes Coordinator Pager [phone]

## 2013-11-02 NOTE — Progress Notes (Signed)
Reviewed pt history and noted HgbA1C of 11.8%.  However pt's Hgb values have been extremely low, thus this is usually a false high (at least to this degree). Pt may not need insulin at all, however Lantus at 10 units should be okay especially with parameters as to when not to take.  Will talk with pt and daughter (if available) re symptoms/signs/treatment of hypoglycemia.  Agree with my colleague, Sonia Baller to use tradjenta vs a sulfonylurea.  Will follow and glad to assist.  Thank you, Rosita Kea, RN, CNS, Diabetes Coordinator [phone])

## 2013-11-02 NOTE — Progress Notes (Signed)
[facility]                                                                              Patient Demographics  Carmen Stone, is a 58 y.o. female, DOB - December 14, 1955, ZTI:[id number]  Admit date - 10/22/2013   Admitting Physician [name], MD  Outpatient Primary MD for the patient is [name], MD  LOS - 11   Chief Complaint  Patient presents with  . Breast Discharge        Assessment & Plan  Acute respiratory or hypoxia -Resolved -Chest x-ray are unremarkable  Inflammatory breast cancer/breast abscess -Status post radial mastectomy with lymph node dissection -Surgery following -CT scan of the metastasis -Will discontinue Bactrim -Will place on Ceftin 500 mg twice a day  Diabetes mellitus type 2 -Continue insulin sliding scale, Lantus, CBG monitoring -Hemoglobin A1c 11.8 (10/22/2013)  Acute renal failure -FENa 1.3%, likely ATN from vancomycin, bactrim -Expect creatinine to continue to rise and eventually plateau -Place patient on normal saline at 75 cc per hour -Will continue to monitor creatinine -Will discontinue bactrim  Postoperative anemia due to acute blood loss -Baseline hemoglobin appears to be benign -Patient was transfused 2 units of packed red blood cells on 512 -Hemoglobin currently 8.4 -Will continue to monitor CBC -Patient currently hemodynamically stable  Urinary tract infection -UA on 10/22/2013: few bacteria, 1-20 white blood cells, small leukocytes -Urine culture: 30K colonies, nonspecific -Patient appears patient received adequate amount of antibiotic average  Code Status: Full  Family Communication: None at bedside  Disposition Plan: Admitted, likely discharge once renal function improves  Time Spent in minutes   30 minutes  Procedures  Right mastectomy  Consults   [facility] will assume primary role  Surgery  DVT Prophylaxis  Lovenox   Lab Results  Component Value Date   PLT 735* 11/02/2013     Medications  Scheduled Meds: . enoxaparin (LOVENOX) injection  40 mg Subcutaneous Q24H  . insulin aspart  0-20 Units Subcutaneous TID WC  . insulin aspart  0-5 Units Subcutaneous QHS  . insulin glargine  14 Units Subcutaneous Daily  . sodium chloride  3 mL Intravenous Q12H  . sodium phosphate  1 enema Rectal Once  . sulfamethoxazole-trimethoprim  1 tablet Oral Q12H   Continuous Infusions: . sodium chloride     PRN Meds:.sodium chloride, acetaminophen, alum & mag hydroxide-simeth, bisacodyl, diphenhydrAMINE, morphine injection, oxyCODONE, polyethylene glycol, sodium chloride  Antibiotics    Anti-infectives   Start     Dose/Rate Route Frequency Ordered Stop   10/31/13 1000  sulfamethoxazole-trimethoprim (BACTRIM DS) 800-160 MG per tablet 1 tablet     1 tablet Oral Every 12 hours 10/31/13 0921     10/30/13 1000  vancomycin (VANCOCIN) IVPB 1000 mg/200 mL premix  Status:  Discontinued     1,000 mg 200 mL/hr over 60 Minutes Intravenous Every 24 hours 10/29/13 1102 10/30/13 1316   10/22/13 1215  clindamycin (CLEOCIN) IVPB 600 mg  Status:  Discontinued     600 mg 100 mL/hr over 30 Minutes Intravenous 4 times per day 10/22/13 1136 10/31/13 0921   10/22/13 1215  vancomycin (VANCOCIN) IVPB 1000 mg/200 mL premix  Status:  Discontinued  1,000 mg 200 mL/hr over 60 Minutes Intravenous Every 12 hours 10/22/13 1142 10/29/13 1102   10/22/13 0430  ceFEPIme (MAXIPIME) 1 g in dextrose 5 % 50 mL IVPB     1 g 100 mL/hr over 30 Minutes Intravenous  Once 10/22/13 0423 10/22/13 0552   10/22/13 0430  vancomycin (VANCOCIN) IVPB 1000 mg/200 mL premix  Status:  Discontinued     1,000 mg 200 mL/hr over 60 Minutes Intravenous  Once 10/22/13 0423 10/22/13 1142   10/22/13 0430  metroNIDAZOLE (FLAGYL) IVPB 500 mg  Status:  Discontinued     500 mg 100 mL/hr over 60 Minutes Intravenous  Once 10/22/13 0423 10/22/13 0424   10/22/13 0430  [MAR Hold]  clindamycin (CLEOCIN) IVPB 900 mg     (On MAR Hold since  10/22/13 0615)   900 mg 100 mL/hr over 30 Minutes Intravenous  Once 10/22/13 0424 10/30/13 2103        Subjective:   Carmen Stone seen and examined today.  Patient currently has no complaints. Denies any chest pain or shortness of breath at this time.  Objective:   Filed Vitals:   11/01/13 0610 11/01/13 1351 11/01/13 2111 11/02/13 0516  BP: 132/68 126/64 123/57 119/51  Pulse: 81 83 85 72  Temp: 98.4 F (36.9 C) 98.6 F (37 C) 98.9 F (37.2 C) 98.5 F (36.9 C)  TempSrc: Oral Oral Oral Oral  Resp: 16 16 16 16   Height:      Weight:      SpO2: 94% 97% 97% 93%    Wt Readings from Last 3 Encounters:  10/22/13 74.532 kg (164 lb 5 oz)  10/22/13 74.532 kg (164 lb 5 oz)  10/22/13 74.532 kg (164 lb 5 oz)     Intake/Output Summary (Last 24 hours) at 11/02/13 1143 Last data filed at 11/02/13 9379  Gross per 24 hour  Intake    603 ml  Output    153 ml  Net    450 ml    Exam  General: Well developed, well nourished, NAD, appears stated age  HEENT: NCAT, PERRLA, EOMI, Anicteic Sclera, mucous membranes moist.  Neck: Supple, no JVD, no masses  Cardiovascular: S1 S2 auscultated, no rubs, murmurs or gallops. Regular rate and rhythm.  Respiratory: Clear to auscultation bilaterally with equal chest rise  Abdomen: Soft, obese, +hernia, nontender, nondistended, + bowel sounds  Extremities: warm dry without cyanosis clubbing or edema  Neuro: AAOx3, cranial nerves grossly intact. Strength 5/5 in patient's upper and lower extremities bilaterally  Skin: Without rashes exudates or nodules  Psych: Normal affect and demeanor with intact judgement and insight  Data Review   Micro Results Recent Results (from the past 240 hour(s))  SURGICAL PCR SCREEN     Status: Abnormal   Collection Time    10/24/13  8:51 AM      Result Value Ref Range Status   MRSA, PCR POSITIVE (*) NEGATIVE Final   Staphylococcus aureus POSITIVE (*) NEGATIVE Final   Comment:            The Xpert  SA Assay (FDA     approved for NASAL specimens     in patients over 85 years of age),     is one component of     a comprehensive surveillance     program.  Test performance has     been validated by [employer] for patients greater     than or equal to 52 year old.  It is not intended     to diagnose infection nor to     guide or monitor treatment.  URINE CULTURE     Status: None   Collection Time    10/25/13  7:41 PM      Result Value Ref Range Status   Specimen Description URINE, RANDOM   Final   Special Requests NONE   Final   Culture  Setup Time     Final   Value: 10/26/2013 00:49     Performed at [place]     Final   Value: 30,000 COLONIES/ML     Performed at [facility]   Culture     Final   Value: Multiple bacterial morphotypes present, none predominant. Suggest appropriate recollection if clinically indicated.     Performed at [facility]   Report Status 10/26/2013 FINAL   Final    Radiology Reports Dg Chest 2 View  10/22/2013   CLINICAL DATA:  Right breast pain and swelling.  Fever.  EXAM: CHEST  2 VIEW  COMPARISON:  06/13/2010  FINDINGS: The right breast is asymmetrically enlarged with extensive subcutaneous gas. This creates increased density over the right chest. There is no evidence of pneumonia in the lateral projection. No cardiomegaly. No edema, effusion, or pneumothorax.  Critical Value/emergent results were called by telephone at the time of interpretation on 10/22/2013 at 4:17 AM to Dr. Kathrynn Humble , who verbally acknowledged these results.  IMPRESSION: Right breast swelling with extensive subcutaneous gas. If no recent intervention, findings concerning for necrotizing infection.   Electronically Signed   By: Jorje Guild M.D.   On: 10/22/2013 04:17   Ct Chest W Contrast  10/26/2013   CLINICAL DATA:  58 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 Evaluate for metastatic disease.  EXAM: CT CHEST,  ABDOMEN, AND PELVIS WITH CONTRAST  TECHNIQUE: Multidetector CT imaging of the chest, abdomen and pelvis was performed following the standard protocol during bolus administration of intravenous contrast.  CONTRAST:  16mL OMNIPAQUE IOHEXOL 300 MG/ML  SOLN  COMPARISON:  CT of abdomen pelvis 06/21/2010.  FINDINGS: CT CHEST FINDINGS  Mediastinum: Heart size is normal. There is no significant pericardial fluid, thickening or pericardial calcification. No pathologically enlarged mediastinal, bilateral hilar or internal mammary lymph nodes. Esophagus is unremarkable in appearance.  Lungs/Pleura: Trace bilateral pleural effusions layering dependently with some mild dependent subsegmental atelectasis in the lower lobes of the lungs bilaterally. No acute consolidative airspace disease. No suspicious appearing pulmonary nodules or masses.  Musculoskeletal: Postoperative changes in the right breast are noted, with a large open wound filled with packing material. The periphery of the open wound demonstrates a thick rind of soft tissue, some of which appears to enhance. In the medial aspect of the right breast (image 27 of series 2) there is a 1.5 x 1.3 cm enhancing soft tissue lesion, suspicious for a focus of residual tumor or a malignant lymph node. Numerous borderline enlarged and mildly enlarged right axillary lymph nodes are noted, measuring up to 12 mm in short axis. There are no aggressive appearing lytic or blastic lesions noted in the visualized portions of the skeleton.  CT ABDOMEN AND PELVIS FINDINGS  Abdomen/Pelvis: Several well-defined low-attenuation lesions in the liver appear unchanged in size, number and pattern of distribution compared to prior study 2012, presumably small cysts. The largest of these lesions measures 2.1 cm in segment 4A. No other suspicious hepatic lesions are noted. Status post cholecystectomy. The  appearance of the pancreas, spleen, bilateral adrenal glands and bilateral kidneys is  unremarkable.  Normal appendix (retrocecal in position). No significant volume of ascites. No pneumoperitoneum. No pathologic distention of small bowel. There are a few colonic diverticulae, particularly in the region of the sigmoid colon, without surrounding inflammatory changes to suggest an acute diverticulitis at this time. Several small ventral hernias are noted, containing several loops of small bowel and a short segment of the mid transverse colon, without evidence of bowel incarceration or obstruction at this time. No lymphadenopathy identified within the abdomen or pelvis. Status post hysterectomy. Ovaries are not confidently identified may be surgically absent or atrophic.  Musculoskeletal: There are no aggressive appearing lytic or blastic lesions noted in the visualized portions of the skeleton. Edema throughout the fat of the right flank, presumably reactive to the process in the right breast.  IMPRESSION: 1. Postoperative changes of recent right breast surgery with packed open wound which has a thick rind of soft tissue, some of which enhances, a 1.3 x 1.5 cm enhancing lesion in the medial aspect of the right breast which may represent residual tumor or a malignant lymph node, and multiple enlarged right axillary nodes concerning for axillary nodal metastases. 2. No other definite signs of metastatic disease in the chest, abdomen or pelvis. 3. Multiple small ventral hernias containing several loops of small bowel, as well as the mid transverse colon, without associated bowel incarceration or obstruction at this time. 4. Trace bilateral pleural effusions with mild dependent subsegmental atelectasis in the lower lobes of the lungs bilaterally. 5. Status post cholecystectomy and hysterectomy. 6. Additional incidental findings, as above.   Electronically Signed   By: Vinnie Langton M.D.   On: 10/26/2013 08:35   Ct Abdomen Pelvis W Contrast  10/26/2013   CLINICAL DATA:  58 year old female with history  of inflammatory breast cancer. Evaluate for metastatic disease.  EXAM: CT CHEST, ABDOMEN, AND PELVIS WITH CONTRAST  TECHNIQUE: Multidetector CT imaging of the chest, abdomen and pelvis was performed following the standard protocol during bolus administration of intravenous contrast.  CONTRAST:  142mL OMNIPAQUE IOHEXOL 300 MG/ML  SOLN  COMPARISON:  CT of abdomen pelvis 06/21/2010.  FINDINGS: CT CHEST FINDINGS  Mediastinum: Heart size is normal. There is no significant pericardial fluid, thickening or pericardial calcification. No pathologically enlarged mediastinal, bilateral hilar or internal mammary lymph nodes. Esophagus is unremarkable in appearance.  Lungs/Pleura: Trace bilateral pleural effusions layering dependently with some mild dependent subsegmental atelectasis in the lower lobes of the lungs bilaterally. No acute consolidative airspace disease. No suspicious appearing pulmonary nodules or masses.  Musculoskeletal: Postoperative changes in the right breast are noted, with a large open wound filled with packing material. The periphery of the open wound demonstrates a thick rind of soft tissue, some of which appears to enhance. In the medial aspect of the right breast (image 27 of series 2) there is a 1.5 x 1.3 cm enhancing soft tissue lesion, suspicious for a focus of residual tumor or a malignant lymph node. Numerous borderline enlarged and mildly enlarged right axillary lymph nodes are noted, measuring up to 12 mm in short axis. There are no aggressive appearing lytic or blastic lesions noted in the visualized portions of the skeleton.  CT ABDOMEN AND PELVIS FINDINGS  Abdomen/Pelvis: Several well-defined low-attenuation lesions in the liver appear unchanged in size, number and pattern of distribution compared to prior study 2012, presumably small cysts. The largest of these lesions measures 2.1 cm in segment 4A. No  other suspicious hepatic lesions are noted. Status post cholecystectomy. The appearance of  the pancreas, spleen, bilateral adrenal glands and bilateral kidneys is unremarkable.  Normal appendix (retrocecal in position). No significant volume of ascites. No pneumoperitoneum. No pathologic distention of small bowel. There are a few colonic diverticulae, particularly in the region of the sigmoid colon, without surrounding inflammatory changes to suggest an acute diverticulitis at this time. Several small ventral hernias are noted, containing several loops of small bowel and a short segment of the mid transverse colon, without evidence of bowel incarceration or obstruction at this time. No lymphadenopathy identified within the abdomen or pelvis. Status post hysterectomy. Ovaries are not confidently identified may be surgically absent or atrophic.  Musculoskeletal: There are no aggressive appearing lytic or blastic lesions noted in the visualized portions of the skeleton. Edema throughout the fat of the right flank, presumably reactive to the process in the right breast.  IMPRESSION: 1. Postoperative changes of recent right breast surgery with packed open wound which has a thick rind of soft tissue, some of which enhances, a 1.3 x 1.5 cm enhancing lesion in the medial aspect of the right breast which may represent residual tumor or a malignant lymph node, and multiple enlarged right axillary nodes concerning for axillary nodal metastases. 2. No other definite signs of metastatic disease in the chest, abdomen or pelvis. 3. Multiple small ventral hernias containing several loops of small bowel, as well as the mid transverse colon, without associated bowel incarceration or obstruction at this time. 4. Trace bilateral pleural effusions with mild dependent subsegmental atelectasis in the lower lobes of the lungs bilaterally. 5. Status post cholecystectomy and hysterectomy. 6. Additional incidental findings, as above.   Electronically Signed   By: Vinnie Langton M.D.   On: 10/26/2013 08:35    CBC  Recent  Labs Lab 10/29/13 1140 10/30/13 1014 10/31/13 0505 11/01/13 0653 11/02/13 0550  WBC 17.1* 14.9* 13.4* 11.9* 9.1  HGB 8.6* 8.3* 7.8* 7.7* 8.4*  HCT 26.0* 25.1* 24.0* 23.7* 25.8*  PLT 703* 729* 725* 709* 735*  MCV 86.4 87.2 88.2 87.5 89.0  MCH 28.6 28.8 28.7 28.4 29.0  MCHC 33.1 33.1 32.5 32.5 32.6  RDW 14.2 14.3 14.4 14.3 14.5    Chemistries   Recent Labs Lab 10/29/13 0610 10/30/13 1014 10/31/13 0505 11/01/13 0653 11/02/13 0550  NA 138 138  138 138 139 139  K 4.3 4.0  4.2 4.2 4.2 4.6  CL 100 99  99 101 101 100  CO2 27 26  23 24 26 28   GLUCOSE 109* 178*  175* 131* 127* 95  BUN 15 15  15 16 18 19   CREATININE 1.47* 1.67*  1.72* 1.74* 1.82* 2.05*  CALCIUM 8.5 8.7  8.7 8.4 8.6 9.0  AST  --  20  --   --   --   ALT  --  6  --   --   --   ALKPHOS  --  70  --   --   --   BILITOT  --  <0.2*  --   --   --    ------------------------------------------------------------------------------------------------------------------ estimated creatinine clearance is 29.2 ml/min (by C-G formula based on Cr of 2.05). ------------------------------------------------------------------------------------------------------------------ No results found for this basename: HGBA1C,  in the last 72 hours ------------------------------------------------------------------------------------------------------------------ No results found for this basename: CHOL, HDL, LDLCALC, TRIG, CHOLHDL, LDLDIRECT,  in the last 72 hours ------------------------------------------------------------------------------------------------------------------ No results found for this basename: TSH, T4TOTAL, FREET3, T3FREE, THYROIDAB,  in the last 72  hours ------------------------------------------------------------------------------------------------------------------ No results found for this basename: VITAMINB12, FOLATE, FERRITIN, TIBC, IRON, RETICCTPCT,  in the last 72 hours  Coagulation profile No results found for  this basename: INR, PROTIME,  in the last 168 hours  No results found for this basename: DDIMER,  in the last 72 hours  Cardiac Enzymes No results found for this basename: CK, CKMB, TROPONINI, MYOGLOBIN,  in the last 168 hours ------------------------------------------------------------------------------------------------------------------ No components found with this basename: POCBNP,     Giovani Neumeister D.O. on 11/02/2013 at 11:43 AM  Between 7am to 7pm - Pager - [phone]  After 7pm go to [email or web address] - password [email or web address]  And look for the night coverage person covering for me after hours  [facility] Office  [phone]

## 2013-11-03 DIAGNOSIS — E785 Hyperlipidemia, unspecified: Secondary | ICD-10-CM

## 2013-11-03 LAB — CBC
HCT: 24.1 % — ABNORMAL LOW (ref 36.0–46.0)
HEMOGLOBIN: 7.8 g/dL — AB (ref 12.0–15.0)
MCH: 28.9 pg (ref 26.0–34.0)
MCHC: 32.4 g/dL (ref 30.0–36.0)
MCV: 89.3 fL (ref 78.0–100.0)
Platelets: 693 10*3/uL — ABNORMAL HIGH (ref 150–400)
RBC: 2.7 MIL/uL — AB (ref 3.87–5.11)
RDW: 14.5 % (ref 11.5–15.5)
WBC: 10.1 10*3/uL (ref 4.0–10.5)

## 2013-11-03 LAB — BASIC METABOLIC PANEL
BUN: 15 mg/dL (ref 6–23)
CALCIUM: 8.5 mg/dL (ref 8.4–10.5)
CO2: 25 mEq/L (ref 19–32)
Chloride: 102 mEq/L (ref 96–112)
Creatinine, Ser: 1.98 mg/dL — ABNORMAL HIGH (ref 0.50–1.10)
GFR calc non Af Amer: 26 mL/min — ABNORMAL LOW (ref >90)
GFR, EST AFRICAN AMERICAN: 31 mL/min — AB (ref >90)
GLUCOSE: 93 mg/dL (ref 70–99)
POTASSIUM: 4.1 meq/L (ref 3.7–5.3)
SODIUM: 141 meq/L (ref 137–147)

## 2013-11-03 LAB — GLUCOSE, CAPILLARY
GLUCOSE-CAPILLARY: 97 mg/dL (ref 70–99)
GLUCOSE-CAPILLARY: 92 mg/dL (ref 70–99)
Glucose-Capillary: 123 mg/dL — ABNORMAL HIGH (ref 70–99)
Glucose-Capillary: 93 mg/dL (ref 70–99)

## 2013-11-03 MED ORDER — ONDANSETRON HCL 4 MG/2ML IJ SOLN
4.0000 mg | Freq: Four times a day (QID) | INTRAMUSCULAR | Status: DC | PRN
  Administered 2013-11-03 – 2013-11-04 (×2): 4 mg via INTRAVENOUS
  Filled 2013-11-03 (×2): qty 2

## 2013-11-03 NOTE — Discharge Summary (Signed)
Physician Transfer Summary   Patient ID:  Carmen Stone  MRN: [id number]  DOB/AGE: 59-Oct-1957 59 y.o.  Admit date: 10/22/2013  Transfer date: 11/02/2013  MD accepting transfer: Dr. Cristal Ford   Admitting Diagnosis:  Right breast necrotizing soft tissue infection  SIRS  New onset Type 2 DM  Poor historian  Ventral incisional hernia    Current Diagnosis  Patient Active Problem List    Diagnosis  Date Noted   .  Inflammatory breast cancer  11/02/2013   .  ATN (acute tubular necrosis)  11/02/2013   .  Acute respiratory failure  11/02/2013   .  Acute respiratory failure with hypoxia  10/25/2013   .  Abnormal urinalysis  10/25/2013   .  Postoperative anemia due to acute blood loss  10/24/2013   .  Breast abscess of female  10/22/2013   .  HYPERLIPIDEMIA  07/16/2010   .  ATELECTASIS  07/16/2010   .  DIABETES MELLITUS, TYPE II  06/24/2010   .  CHOLELITHIASIS  06/24/2010   .  HYPOXEMIA  06/24/2010   .  SMALL BOWEL OBSTRUCTION, HX OF  06/24/2010    Consultants  Dr. Candiss Norse ([facility])  Dr. Mart Piggs ([facility])  Dr. Cristal Ford ([facility])  Diabetes nurse educator   Pathology (10/26/13):  1. Breast, simple mastectomy, Right  - INVASIVE DUCTAL CARCINOMA, GRADE III/III.  - LYMPHOVASCULAR INVASION IS IDENTIFIED.  - CARCINOMA IS PRESENT AT NON ORIENTED INKED TISSUE EDGE(S).  - SEE ONCOLOGY TABLE BELOW.  2. Lymph nodes, regional resection, Right axilla  - METASTATIC CARCINOMA IN 2 OF 16 LYMPH NODES (2/16).   Procedures  Dr. [name] (10/26/13) - Completion right mastectomy, right axillary node dissection   Dr. [name] (10/24/13) - Right total mastectomy   Dr. Redmond Pulling (10/22/13) - I&D and debridement of right breast wound with breast biopsy   Hospital Course:  58 yo AAF who states she was in her usual state of health until this past Monday when she noticed some right breast swelling and pain. Over the past several days the area has worsened and  started draining fluid. She denies any trauma to area. She states that her right breast appeared the same as the left breast just until this past Monday. She denies any PMH, but when I told her chart documents her being a diabetic - she states she was unaware of this. Last mammogram was 5 yrs ago. She denies any family hx of breast ca or other malignancy. She denies any wt loss, LAD, nipple drainage.  Workup seemed to be consistent with a necrotizing soft tissue infection and SIRS. Patient was admitted and underwent procedures listed above. After multiple surgeries she ultimately underwent a completion mastectomy and right axillary lymph node dissection. She was found to have inflammatory breast cancer with 2/16 lymph nodes positive. Tolerated procedures well and was transferred to the ICU due to acute respiratory failure/SIRS. She remained in the ICU for a few days prior to being transferred to the surgical floor. After surgery her WBC and fevers started to trend down. She was continued on IV antibiotics (clindamycin and vancomycin). Her blood sugars remained elevated, Dr. Candiss Norse of [facility] and his associates were consulted on the patient regarding her new onset diabetes with a Hgb of 11.8. On 10/29/13 her creatinine jumped from 0.97 to 1.47. It continued to trend up to 2.05 on 11/02/13. The hospitalist team was asked to help manage this as well. It was thought to be due  to the vancomycin related ATN. IV antibiotics were stopped and she was placed on oral Bactrim which was ultimately switched to ceftin 500mg  BID. Diet was advanced as tolerated. On POD #11/9/7 (11/02/13, the patient was voiding well, tolerating diet, ambulating well, pain well controlled, vital signs stable, incisions site clean, dry and intact. The patient is accepted in transfer to Dr. Aldine Contes service. We will continue to follow along as a Optometrist. Patient will follow up in our office in 2-3 weeks after discharge with Dr. [name] and  knows to call with questions or concerns.   Signed:  Coralie Keens, [facility]  [phone]  Pager: [phone]  11/02/2013, 11:10 AM

## 2013-11-03 NOTE — Progress Notes (Signed)
COURTESY VISIT:  Saw patient 5/20 and gave her results of staging studies.  Wrote for echo in preparation for chemo as outptient  Appreciate your help to this patient! She has an appt with me 5/22 but if she is still in the hospital we will reschedule.

## 2013-11-03 NOTE — Progress Notes (Signed)
Patient ID: Carmen Stone, female   DOB: January 21, 1956, 58 y.o.   MRN: [id number] 8 Days Post-Op  Subjective: Pt feels ok today.  No new c/o  Objective: Vital signs in last 24 hours: Temp:  [98 F (36.7 C)-98.9 F (37.2 C)] 98.9 F (37.2 C) (05/21 0526) Pulse Rate:  [65-78] 69 (05/21 0526) Resp:  [16-17] 16 (05/21 0526) BP: (123-127)/(55-60) 123/55 mmHg (05/21 0526) SpO2:  [95 %-98 %] 95 % (05/21 0526) Last BM Date: 10/28/13  Intake/Output from previous day: 05/20 0701 - 05/21 0700 In: 3 [I.V.:3] Out: 23 [Drains:23] Intake/Output this shift: Total I/O In: 360 [P.O.:360] Out: -   PE: Chest: incision is still intact.  Inferior flap with some dry necrosis.  Erythema of superior flap is decreasing.  No drainage.  JPs with serous drainage.  Lab Results:   Recent Labs  11/02/13 0550 11/03/13 0532  WBC 9.1 10.1  HGB 8.4* 7.8*  HCT 25.8* 24.1*  PLT 735* 693*   BMET  Recent Labs  11/02/13 0550 11/03/13 0532  NA 139 141  K 4.6 4.1  CL 100 102  CO2 28 25  GLUCOSE 95 93  BUN 19 15  CREATININE 2.05* 1.98*  CALCIUM 9.0 8.5   PT/INR No results found for this basename: LABPROT, INR,  in the last 72 hours CMP     Component Value Date/Time   NA 141 11/03/2013 0532   K 4.1 11/03/2013 0532   CL 102 11/03/2013 0532   CO2 25 11/03/2013 0532   GLUCOSE 93 11/03/2013 0532   BUN 15 11/03/2013 0532   CREATININE 1.98* 11/03/2013 0532   CALCIUM 8.5 11/03/2013 0532   PROT 6.5 10/30/2013 1014   ALBUMIN 2.2* 10/30/2013 1014   AST 20 10/30/2013 1014   ALT 6 10/30/2013 1014   ALKPHOS 70 10/30/2013 1014   BILITOT <0.2* 10/30/2013 1014   GFRNONAA 26* 11/03/2013 0532   GFRAA 31* 11/03/2013 0532   Lipase     Component Value Date/Time   LIPASE 16 06/08/2010 0430       Studies/Results: No results found.  Anti-infectives: Anti-infectives   Start     Dose/Rate Route Frequency Ordered Stop   11/02/13 1800  cefUROXime (CEFTIN) tablet 500 mg     500 mg Oral 2 times daily with meals  11/02/13 1143     10/31/13 1000  sulfamethoxazole-trimethoprim (BACTRIM DS) 800-160 MG per tablet 1 tablet  Status:  Discontinued     1 tablet Oral Every 12 hours 10/31/13 0921 11/02/13 1143   10/30/13 1000  vancomycin (VANCOCIN) IVPB 1000 mg/200 mL premix  Status:  Discontinued     1,000 mg 200 mL/hr over 60 Minutes Intravenous Every 24 hours 10/29/13 1102 10/30/13 1316   10/22/13 1215  clindamycin (CLEOCIN) IVPB 600 mg  Status:  Discontinued     600 mg 100 mL/hr over 30 Minutes Intravenous 4 times per day 10/22/13 1136 10/31/13 0921   10/22/13 1215  vancomycin (VANCOCIN) IVPB 1000 mg/200 mL premix  Status:  Discontinued     1,000 mg 200 mL/hr over 60 Minutes Intravenous Every 12 hours 10/22/13 1142 10/29/13 1102   10/22/13 0430  ceFEPIme (MAXIPIME) 1 g in dextrose 5 % 50 mL IVPB     1 g 100 mL/hr over 30 Minutes Intravenous  Once 10/22/13 0423 10/22/13 0552   10/22/13 0430  vancomycin (VANCOCIN) IVPB 1000 mg/200 mL premix  Status:  Discontinued     1,000 mg 200 mL/hr over 60 Minutes Intravenous  Once  10/22/13 0423 10/22/13 1142   10/22/13 0430  metroNIDAZOLE (FLAGYL) IVPB 500 mg  Status:  Discontinued     500 mg 100 mL/hr over 60 Minutes Intravenous  Once 10/22/13 0423 10/22/13 0424   10/22/13 0430  [MAR Hold]  clindamycin (CLEOCIN) IVPB 900 mg     (On MAR Hold since 10/22/13 0615)   900 mg 100 mL/hr over 30 Minutes Intravenous  Once 10/22/13 0424 10/30/13 2103       Assessment/Plan   POD #12/10/8 for what is eventual 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 1.98  Plan: 1. Patient still doing well after mastectomy.  Incision is still intact.  Dr. Jana Hakim saw yesterday.  Appreciate him seeing her. 2. Cont Ceftin.  Will likely need for a 10-14 more days 3. Will need Port a cath. 4. Cr down today to 1.98 5. Appreciate medicine's assistance   LOS: 12 days    [name] 11/03/2013, 9:27  AM Pager: [phone]

## 2013-11-03 NOTE — Progress Notes (Signed)
Echocardiogram 2D Echocardiogram has been performed.  Carmen Stone [name] 11/03/2013, 2:50 PM

## 2013-11-03 NOTE — Discharge Summary (Signed)
Imogene Burn. Georgette Dover, MD, [facility] Surgery  General/ Trauma Surgery  11/03/2013 3:11 PM

## 2013-11-03 NOTE — Progress Notes (Signed)
   Imogene Burn. Georgette Dover, MD, [facility] Surgery  General/ Trauma Surgery  11/03/2013 3:06 PM

## 2013-11-03 NOTE — Progress Notes (Signed)
[facility]                                                                              Patient Demographics  Carmen Stone, is a 58 y.o. female, DOB - Oct 22, 1955, FKC:[id number]  Admit date - 10/22/2013   Admitting Physician [name], MD  Outpatient Primary MD for the patient is [name], MD  LOS - 12   Chief Complaint  Patient presents with  . Breast Discharge        Assessment & Plan  Acute respiratory or hypoxia -Resolved -Chest x-ray are unremarkable  Inflammatory breast cancer/breast abscess -Status post radial mastectomy with lymph node dissection -Surgery following, drains in place -CT scan of the metastasis -Bactrim discontinued on 11/02/13 -Continue on Ceftin 500 mg twice a day  Diabetes mellitus type 2 -Continue insulin sliding scale, Lantus, CBG monitoring -Hemoglobin A1c 11.8 (10/22/2013) -Spoke with Diabetes coordinator, will place patient on Lantus 10units daily and Tradjenta -CBGs under good control  Acute renal failure -FENa 1.3%, likely ATN from vancomycin, bactrim -Creatinine appears to be improving -Continue normal saline at 75 cc per hour and to monitor BMP -Bactrim discontinued -Avoid nephrotoxic agents  Postoperative anemia due to acute blood loss -Baseline hemoglobin appears to be benign -Patient was transfused 2 units of packed red blood cells on 5/12 -Hemoglobin currently 7.8 -Continue to monitor CBC -Patient currently hemodynamically stable  Urinary tract infection -UA on 10/22/2013: few bacteria, 1-20 white blood cells, small leukocytes -Urine culture: 30K colonies, nonspecific -Patient appears patient received adequate amount of antibiotic average  Code Status: Full  Family Communication: None at bedside  Disposition Plan: Admitted, likely discharge once renal function improves  Time Spent in minutes   25 minutes  Procedures  Right mastectomy  Consults   TR[facility] will assume primary role   Surgery  DVT Prophylaxis  Lovenox   Lab Results  Component Value Date   PLT 693* 11/03/2013    Medications  Scheduled Meds: . cefUROXime  500 mg Oral BID WC  . enoxaparin (LOVENOX) injection  40 mg Subcutaneous Q24H  . insulin aspart  0-20 Units Subcutaneous TID WC  . insulin aspart  0-5 Units Subcutaneous QHS  . insulin glargine  10 Units Subcutaneous Daily  . linagliptin  5 mg Oral Daily  . sodium chloride  3 mL Intravenous Q12H   Continuous Infusions: . sodium chloride 75 mL/hr at 11/02/13 2200   PRN Meds:.sodium chloride, acetaminophen, alum & mag hydroxide-simeth, bisacodyl, diphenhydrAMINE, morphine injection, oxyCODONE, polyethylene glycol, sodium chloride  Antibiotics    Anti-infectives   Start     Dose/Rate Route Frequency Ordered Stop   11/02/13 1800  cefUROXime (CEFTIN) tablet 500 mg     500 mg Oral 2 times daily with meals 11/02/13 1143     10/31/13 1000  sulfamethoxazole-trimethoprim (BACTRIM DS) 800-160 MG per tablet 1 tablet  Status:  Discontinued     1 tablet Oral Every 12 hours 10/31/13 0921 11/02/13 1143   10/30/13 1000  vancomycin (VANCOCIN) IVPB 1000 mg/200 mL premix  Status:  Discontinued     1,000 mg 200 mL/hr over 60 Minutes Intravenous Every 24 hours 10/29/13 1102 10/30/13 1316   10/22/13  1215  clindamycin (CLEOCIN) IVPB 600 mg  Status:  Discontinued     600 mg 100 mL/hr over 30 Minutes Intravenous 4 times per day 10/22/13 1136 10/31/13 0921   10/22/13 1215  vancomycin (VANCOCIN) IVPB 1000 mg/200 mL premix  Status:  Discontinued     1,000 mg 200 mL/hr over 60 Minutes Intravenous Every 12 hours 10/22/13 1142 10/29/13 1102   10/22/13 0430  ceFEPIme (MAXIPIME) 1 g in dextrose 5 % 50 mL IVPB     1 g 100 mL/hr over 30 Minutes Intravenous  Once 10/22/13 0423 10/22/13 0552   10/22/13 0430  vancomycin (VANCOCIN) IVPB 1000 mg/200 mL premix  Status:  Discontinued     1,000 mg 200 mL/hr over 60 Minutes Intravenous  Once 10/22/13 0423 10/22/13 1142    10/22/13 0430  metroNIDAZOLE (FLAGYL) IVPB 500 mg  Status:  Discontinued     500 mg 100 mL/hr over 60 Minutes Intravenous  Once 10/22/13 0423 10/22/13 0424   10/22/13 0430  [MAR Hold]  clindamycin (CLEOCIN) IVPB 900 mg     (On MAR Hold since 10/22/13 0615)   900 mg 100 mL/hr over 30 Minutes Intravenous  Once 10/22/13 0424 10/30/13 2103        Subjective:   Carmen Stone seen and examined today.  Patient states she woke up with an upset stomach this morning and had one episode of vomiting.  She denies any abdominal pain at this time.  She denies any chest pain or shortness of breath.   Objective:   Filed Vitals:   11/02/13 0516 11/02/13 1231 11/02/13 2104 11/03/13 0526  BP: 119/51 124/60 127/57 123/55  Pulse: 72 78 65 69  Temp: 98.5 F (36.9 C) 98.7 F (37.1 C) 98 F (36.7 C) 98.9 F (37.2 C)  TempSrc: Oral Oral Oral   Resp: 16 17 17 16   Height:      Weight:      SpO2: 93% 98% 97% 95%    Wt Readings from Last 3 Encounters:  10/22/13 74.532 kg (164 lb 5 oz)  10/22/13 74.532 kg (164 lb 5 oz)  10/22/13 74.532 kg (164 lb 5 oz)     Intake/Output Summary (Last 24 hours) at 11/03/13 0757 Last data filed at 11/02/13 2105  Gross per 24 hour  Intake      3 ml  Output     23 ml  Net    -20 ml    Exam  General: Well developed, well nourished, NAD, appears stated age  HEENT: NCAT, mucous membranes moist.  Neck: Supple, no JVD, no masses  Cardiovascular: S1 S2 auscultated, no rubs, murmurs or gallops. Regular rate and rhythm.  Respiratory: Clear to auscultation bilaterally with equal chest rise  Abdomen: Soft, obese, +hernia, nontender, nondistended, + bowel sounds  Extremities: warm dry without cyanosis clubbing or edema  Neuro: AAOx3, no focal deficits  Skin: Without rashes exudates or nodules  Psych: Normal affect and demeanor with intact judgement and insight  Data Review   Micro Results Recent Results (from the past 240 hour(s))  SURGICAL PCR SCREEN      Status: Abnormal   Collection Time    10/24/13  8:51 AM      Result Value Ref Range Status   MRSA, PCR POSITIVE (*) NEGATIVE Final   Staphylococcus aureus POSITIVE (*) NEGATIVE Final   Comment:            The Xpert SA Assay (FDA     approved for NASAL specimens  in patients over 23 years of age),     is one component of     a comprehensive surveillance     program.  Test performance has     been validated by [employer] for patients greater     than or equal to 48 year old.     It is not intended     to diagnose infection nor to     guide or monitor treatment.  URINE CULTURE     Status: None   Collection Time    10/25/13  7:41 PM      Result Value Ref Range Status   Specimen Description URINE, RANDOM   Final   Special Requests NONE   Final   Culture  Setup Time     Final   Value: 10/26/2013 00:49     Performed at [place]     Final   Value: 30,000 COLONIES/ML     Performed at [facility]   Culture     Final   Value: Multiple bacterial morphotypes present, none predominant. Suggest appropriate recollection if clinically indicated.     Performed at [facility]   Report Status 10/26/2013 FINAL   Final    Radiology Reports Dg Chest 2 View  10/22/2013   CLINICAL DATA:  Right breast pain and swelling.  Fever.  EXAM: CHEST  2 VIEW  COMPARISON:  06/13/2010  FINDINGS: The right breast is asymmetrically enlarged with extensive subcutaneous gas. This creates increased density over the right chest. There is no evidence of pneumonia in the lateral projection. No cardiomegaly. No edema, effusion, or pneumothorax.  Critical Value/emergent results were called by telephone at the time of interpretation on 10/22/2013 at 4:17 AM to Dr. Kathrynn Humble , who verbally acknowledged these results.  IMPRESSION: Right breast swelling with extensive subcutaneous gas. If no recent intervention, findings concerning for necrotizing infection.   Electronically  Signed   By: Jorje Guild M.D.   On: 10/22/2013 04:17   Ct Chest W Contrast  10/26/2013   CLINICAL DATA:  58 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 Evaluate for metastatic disease.  EXAM: CT CHEST, ABDOMEN, AND PELVIS WITH CONTRAST  TECHNIQUE: Multidetector CT imaging of the chest, abdomen and pelvis was performed following the standard protocol during bolus administration of intravenous contrast.  CONTRAST:  153mL OMNIPAQUE IOHEXOL 300 MG/ML  SOLN  COMPARISON:  CT of abdomen pelvis 06/21/2010.  FINDINGS: CT CHEST FINDINGS  Mediastinum: Heart size is normal. There is no significant pericardial fluid, thickening or pericardial calcification. No pathologically enlarged mediastinal, bilateral hilar or internal mammary lymph nodes. Esophagus is unremarkable in appearance.  Lungs/Pleura: Trace bilateral pleural effusions layering dependently with some mild dependent subsegmental atelectasis in the lower lobes of the lungs bilaterally. No acute consolidative airspace disease. No suspicious appearing pulmonary nodules or masses.  Musculoskeletal: Postoperative changes in the right breast are noted, with a large open wound filled with packing material. The periphery of the open wound demonstrates a thick rind of soft tissue, some of which appears to enhance. In the medial aspect of the right breast (image 27 of series 2) there is a 1.5 x 1.3 cm enhancing soft tissue lesion, suspicious for a focus of residual tumor or a malignant lymph node. Numerous borderline enlarged and mildly enlarged right axillary lymph nodes are noted, measuring up to 12 mm in short axis. There are no aggressive appearing lytic or blastic lesions  noted in the visualized portions of the skeleton.  CT ABDOMEN AND PELVIS FINDINGS  Abdomen/Pelvis: Several well-defined low-attenuation lesions in the liver appear unchanged in size, number and pattern of distribution compared to prior study 2012, presumably small cysts. The  largest of these lesions measures 2.1 cm in segment 4A. No other suspicious hepatic lesions are noted. Status post cholecystectomy. The appearance of the pancreas, spleen, bilateral adrenal glands and bilateral kidneys is unremarkable.  Normal appendix (retrocecal in position). No significant volume of ascites. No pneumoperitoneum. No pathologic distention of small bowel. There are a few colonic diverticulae, particularly in the region of the sigmoid colon, without surrounding inflammatory changes to suggest an acute diverticulitis at this time. Several small ventral hernias are noted, containing several loops of small bowel and a short segment of the mid transverse colon, without evidence of bowel incarceration or obstruction at this time. No lymphadenopathy identified within the abdomen or pelvis. Status post hysterectomy. Ovaries are not confidently identified may be surgically absent or atrophic.  Musculoskeletal: There are no aggressive appearing lytic or blastic lesions noted in the visualized portions of the skeleton. Edema throughout the fat of the right flank, presumably reactive to the process in the right breast.  IMPRESSION: 1. Postoperative changes of recent right breast surgery with packed open wound which has a thick rind of soft tissue, some of which enhances, a 1.3 x 1.5 cm enhancing lesion in the medial aspect of the right breast which may represent residual tumor or a malignant lymph node, and multiple enlarged right axillary nodes concerning for axillary nodal metastases. 2. No other definite signs of metastatic disease in the chest, abdomen or pelvis. 3. Multiple small ventral hernias containing several loops of small bowel, as well as the mid transverse colon, without associated bowel incarceration or obstruction at this time. 4. Trace bilateral pleural effusions with mild dependent subsegmental atelectasis in the lower lobes of the lungs bilaterally. 5. Status post cholecystectomy and  hysterectomy. 6. Additional incidental findings, as above.   Electronically Signed   By: Vinnie Langton M.D.   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 Evaluate for metastatic disease.  EXAM: CT CHEST, ABDOMEN, AND PELVIS WITH CONTRAST  TECHNIQUE: Multidetector CT imaging of the chest, abdomen and pelvis was performed following the standard protocol during bolus administration of intravenous contrast.  CONTRAST:  179mL OMNIPAQUE IOHEXOL 300 MG/ML  SOLN  COMPARISON:  CT of abdomen pelvis 06/21/2010.  FINDINGS: CT CHEST FINDINGS  Mediastinum: Heart size is normal. There is no significant pericardial fluid, thickening or pericardial calcification. No pathologically enlarged mediastinal, bilateral hilar or internal mammary lymph nodes. Esophagus is unremarkable in appearance.  Lungs/Pleura: Trace bilateral pleural effusions layering dependently with some mild dependent subsegmental atelectasis in the lower lobes of the lungs bilaterally. No acute consolidative airspace disease. No suspicious appearing pulmonary nodules or masses.  Musculoskeletal: Postoperative changes in the right breast are noted, with a large open wound filled with packing material. The periphery of the open wound demonstrates a thick rind of soft tissue, some of which appears to enhance. In the medial aspect of the right breast (image 27 of series 2) there is a 1.5 x 1.3 cm enhancing soft tissue lesion, suspicious for a focus of residual tumor or a malignant lymph node. Numerous borderline enlarged and mildly enlarged right axillary lymph nodes are noted, measuring up to 12 mm in short  axis. There are no aggressive appearing lytic or blastic lesions noted in the visualized portions of the skeleton.  CT ABDOMEN AND PELVIS FINDINGS  Abdomen/Pelvis: Several well-defined low-attenuation lesions in the liver appear unchanged in size, number and  pattern of distribution compared to prior study 2012, presumably small cysts. The largest of these lesions measures 2.1 cm in segment 4A. No other suspicious hepatic lesions are noted. Status post cholecystectomy. The appearance of the pancreas, spleen, bilateral adrenal glands and bilateral kidneys is unremarkable.  Normal appendix (retrocecal in position). No significant volume of ascites. No pneumoperitoneum. No pathologic distention of small bowel. There are a few colonic diverticulae, particularly in the region of the sigmoid colon, without surrounding inflammatory changes to suggest an acute diverticulitis at this time. Several small ventral hernias are noted, containing several loops of small bowel and a short segment of the mid transverse colon, without evidence of bowel incarceration or obstruction at this time. No lymphadenopathy identified within the abdomen or pelvis. Status post hysterectomy. Ovaries are not confidently identified may be surgically absent or atrophic.  Musculoskeletal: There are no aggressive appearing lytic or blastic lesions noted in the visualized portions of the skeleton. Edema throughout the fat of the right flank, presumably reactive to the process in the right breast.  IMPRESSION: 1. Postoperative changes of recent right breast surgery with packed open wound which has a thick rind of soft tissue, some of which enhances, a 1.3 x 1.5 cm enhancing lesion in the medial aspect of the right breast which may represent residual tumor or a malignant lymph node, and multiple enlarged right axillary nodes concerning for axillary nodal metastases. 2. No other definite signs of metastatic disease in the chest, abdomen or pelvis. 3. Multiple small ventral hernias containing several loops of small bowel, as well as the mid transverse colon, without associated bowel incarceration or obstruction at this time. 4. Trace bilateral pleural effusions with mild dependent subsegmental atelectasis in the  lower lobes of the lungs bilaterally. 5. Status post cholecystectomy and hysterectomy. 6. Additional incidental findings, as above.   Electronically Signed   By: Vinnie Langton M.D.   On: 10/26/2013 08:35    CBC  Recent Labs Lab 10/30/13 1014 10/31/13 0505 11/01/13 0653 11/02/13 0550 11/03/13 0532  WBC 14.9* 13.4* 11.9* 9.1 10.1  HGB 8.3* 7.8* 7.7* 8.4* 7.8*  HCT 25.1* 24.0* 23.7* 25.8* 24.1*  PLT 729* 725* 709* 735* 693*  MCV 87.2 88.2 87.5 89.0 89.3  MCH 28.8 28.7 28.4 29.0 28.9  MCHC 33.1 32.5 32.5 32.6 32.4  RDW 14.3 14.4 14.3 14.5 14.5    Chemistries   Recent Labs Lab 10/30/13 1014 10/31/13 0505 11/01/13 0653 11/02/13 0550 11/03/13 0532  NA 138  138 138 139 139 141  K 4.0  4.2 4.2 4.2 4.6 4.1  CL 99  99 101 101 100 102  CO2 26  23 24 26 28 25   GLUCOSE 178*  175* 131* 127* 95 93  BUN 15  15 16 18 19 15   CREATININE 1.67*  1.72* 1.74* 1.82* 2.05* 1.98*  CALCIUM 8.7  8.7 8.4 8.6 9.0 8.5  AST 20  --   --   --   --   ALT 6  --   --   --   --   ALKPHOS 70  --   --   --   --   BILITOT <0.2*  --   --   --   --    ------------------------------------------------------------------------------------------------------------------ estimated creatinine  clearance is 30.2 ml/min (by C-G formula based on Cr of 1.98). ------------------------------------------------------------------------------------------------------------------ No results found for this basename: HGBA1C,  in the last 72 hours ------------------------------------------------------------------------------------------------------------------ No results found for this basename: CHOL, HDL, LDLCALC, TRIG, CHOLHDL, LDLDIRECT,  in the last 72 hours ------------------------------------------------------------------------------------------------------------------ No results found for this basename: TSH, T4TOTAL, FREET3, T3FREE, THYROIDAB,  in the last 72  hours ------------------------------------------------------------------------------------------------------------------ No results found for this basename: VITAMINB12, FOLATE, FERRITIN, TIBC, IRON, RETICCTPCT,  in the last 72 hours  Coagulation profile No results found for this basename: INR, PROTIME,  in the last 168 hours  No results found for this basename: DDIMER,  in the last 72 hours  Cardiac Enzymes No results found for this basename: CK, CKMB, TROPONINI, MYOGLOBIN,  in the last 168 hours ------------------------------------------------------------------------------------------------------------------ No components found with this basename: POCBNP,     Asif Muchow D.O. on 11/03/2013 at 7:57 AM  Between 7am to 7pm - Pager - [phone]  After 7pm go to [email or web address] - password [email or web address]  And look for the night coverage person covering for me after hours  [facility] Office  [phone]

## 2013-11-04 ENCOUNTER — Ambulatory Visit: Payer: BC Managed Care – PPO

## 2013-11-04 ENCOUNTER — Encounter: Payer: BC Managed Care – PPO | Admitting: Oncology

## 2013-11-04 DIAGNOSIS — R112 Nausea with vomiting, unspecified: Secondary | ICD-10-CM

## 2013-11-04 LAB — BASIC METABOLIC PANEL
BUN: 12 mg/dL (ref 6–23)
CO2: 26 mEq/L (ref 19–32)
CREATININE: 1.99 mg/dL — AB (ref 0.50–1.10)
Calcium: 9 mg/dL (ref 8.4–10.5)
Chloride: 102 mEq/L (ref 96–112)
GFR, EST AFRICAN AMERICAN: 30 mL/min — AB (ref >90)
GFR, EST NON AFRICAN AMERICAN: 26 mL/min — AB (ref >90)
Glucose, Bld: 98 mg/dL (ref 70–99)
Potassium: 4.3 mEq/L (ref 3.7–5.3)
Sodium: 139 mEq/L (ref 137–147)

## 2013-11-04 LAB — GLUCOSE, CAPILLARY
GLUCOSE-CAPILLARY: 94 mg/dL (ref 70–99)
Glucose-Capillary: 99 mg/dL (ref 70–99)
Glucose-Capillary: 104 mg/dL — ABNORMAL HIGH (ref 70–99)
Glucose-Capillary: 116 mg/dL — ABNORMAL HIGH (ref 70–99)

## 2013-11-04 LAB — HEMOGLOBIN AND HEMATOCRIT, BLOOD
HCT: 26.6 % — ABNORMAL LOW (ref 36.0–46.0)
Hemoglobin: 8.5 g/dL — ABNORMAL LOW (ref 12.0–15.0)

## 2013-11-04 MED ORDER — TRAMADOL HCL 50 MG PO TABS
50.0000 mg | ORAL_TABLET | Freq: Four times a day (QID) | ORAL | Status: DC
  Administered 2013-11-04 – 2013-11-07 (×10): 50 mg via ORAL
  Filled 2013-11-04 (×10): qty 1

## 2013-11-04 NOTE — Consult Note (Signed)
Consultation  Referring Provider: [facility]     Primary Care Physician:  [name], MD Primary Gastroenterologist: none        Reason for Consultation:  Nausea and vomiting            HPI:   Carmen Stone is a 58 y.o. female admitted 10/22/13 with right breast wound. She underwent I+D by Dr. Redmond Pulling. Breast was necrotic and she subsquently underwent atotal mastectomy on 5/11.  Patient had symptomatic post-op anemia and was given two units of blood on 5/12.  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 CTscan negative for metastatic disease. Oncology met patient and plans for outpatient chemotherapy  Patient began having nausea and vomiting early this am. Emesis bilious looking. She was nauseated Wed and chose not to each much on Wed or yesterday. Then early this am she vomited. Since episodes of vomiting patient has tolerated partial clear breakfast and lunch.   No chronic GI problems. She gives a remote history of PUD.     Past Medical History  Diagnosis Date  . Diabetes mellitus, type II   . Inflammatory breast cancer     Past Surgical History  Procedure Laterality Date  . Cesarean section    . Ventral hernia repair  2011    repair incacerated VH with biologic mesh; cholecystectomy  . Cholecystectomy  2011  . Incision and drainage of wound Right 10/22/2013    Procedure: IRRIGATION AND DRAINAGE AND DEBRIDEMENT RIGHT BREAST WOUND;  Surgeon: [name], MD;  Location: [place];  Service: General;  Laterality: Right;  . Breast biopsy Right 10/22/2013    Procedure: BREAST BIOPSY;  Surgeon: [name], MD;  Location: [place];  Service: General;  Laterality: Right;  . Irrigation and debridement abscess Right 10/24/2013    Procedure: Right total mastectomy;  Surgeon: [name], MD;  Location: [place];  Service: General;  Laterality: Right;  . Dressing change under anesthesia Right 10/26/2013    Procedure: COMPLETION MASTECTOMY, AXILLARY  DISSECTION;  Surgeon: [name], MD;  Location: [place];  Service: General;  Laterality: Right;   [place]: No colon cancer. No gastric cancer  History  Substance Use Topics  . Smoking status: Never Smoker   . Smokeless tobacco: Not on file  . Alcohol Use: No    Prior to Admission medications   Medication Sig Start Date End Date Taking? Authorizing Provider  acetaminophen (TYLENOL) 325 MG tablet Take 650 mg by mouth every 6 (six) hours as needed for mild pain.   Yes Historical Provider, MD    Current Facility-Administered Medications  Medication Dose Route Frequency Provider Last Rate Last Dose  . 0.9 %  sodium chloride infusion  250 mL Intravenous PRN Thomas A. Cornett, MD      . 0.9 %  sodium chloride infusion   Intravenous Continuous Maryann Mikhail, DO 75 mL/hr at 11/02/13 2200    . acetaminophen (TYLENOL) tablet 650 mg  650 mg Oral Q4H PRN Samella Parr, NP   650 mg at 10/28/13 1410  . alum & mag hydroxide-simeth (MAALOX/MYLANTA) 200-200-20 MG/5ML suspension 30 mL  30 mL Oral Q6H PRN Adin Hector, MD      . bisacodyl (DULCOLAX) suppository 10 mg  10 mg Rectal Q12H PRN Adin Hector, MD      . cefUROXime (CEFTIN) tablet 500 mg  500 mg Oral BID WC Maryann Mikhail, DO   500 mg at 11/04/13 0847  . diphenhydrAMINE (  BENADRYL) injection 12.5-25 mg  12.5-25 mg Intravenous Q6H PRN Adin Hector, MD   25 mg at 10/27/13 2003  . enoxaparin (LOVENOX) injection 40 mg  40 mg Subcutaneous Q24H [name], MD   40 mg at 11/04/13 0945  . insulin aspart (novoLOG) injection 0-20 Units  0-20 Units Subcutaneous TID WC Emina Riebock, NP   3 Units at 11/03/13 1251  . insulin aspart (novoLOG) injection 0-5 Units  0-5 Units Subcutaneous QHS Emina Riebock, NP   3 Units at 10/23/13 2304  . insulin glargine (LANTUS) injection 10 Units  10 Units Subcutaneous Daily Maryann Mikhail, DO   10 Units at 11/04/13 0945  . linagliptin (TRADJENTA) tablet 5 mg  5 mg Oral Daily Maryann Mikhail, DO   5 mg at  11/04/13 0945  . morphine 2 MG/ML injection 2-4 mg  2-4 mg Intravenous Q1H PRN Adin Hector, MD   4 mg at 10/27/13 0008  . ondansetron (ZOFRAN) injection 4 mg  4 mg Intravenous Q6H PRN Maryann Mikhail, DO   4 mg at 11/04/13 0525  . oxyCODONE (Oxy IR/ROXICODONE) immediate release tablet 5-10 mg  5-10 mg Oral Q4H PRN Adin Hector, MD   10 mg at 11/03/13 1744  . polyethylene glycol (MIRALAX / GLYCOLAX) packet 17 g  17 g Oral Q12H PRN Adin Hector, MD      . sodium chloride 0.9 % injection 3 mL  3 mL Intravenous Q12H Thomas A. Cornett, MD   3 mL at 11/03/13 1115  . sodium chloride 0.9 % injection 3 mL  3 mL Intravenous PRN Thomas A. Cornett, MD        Allergies as of 10/22/2013  . (No Known Allergies)   Review of Systems:    All systems reviewed and negative except where noted in HPI.    Physical Exam:  Vital signs in last 24 hours: Temp:  [98.1 F (36.7 C)-99 F (37.2 C)] 98.6 F (37 C) (05/22 1332) Pulse Rate:  [66-70] 66 (05/22 1332) Resp:  [16-17] 17 (05/22 1332) BP: (126-146)/(47-86) 136/64 mmHg (05/22 1332) SpO2:  [93 %-100 %] 100 % (05/22 1332) Last BM Date: 11/03/13 General:   Pleasant black female in NAD Head:  Normocephalic and atraumatic. Eyes:   No icterus.   Conjunctiva pink. Ears:  Normal auditory acuity. Neck:  Supple; no masses felt Lungs:  Respirations even and unlabored. Lungs clear to auscultation bilaterally.  No wheezes, crackles, or rhonchi.  Heart:  Regular rate and rhythm Right Breast : two drains present with serous fluid in bulbs Abdomen:  Soft, obese, nonntender. Normal bowel sounds. No appreciable masses or hepatomegaly. Large mid abdominal wall hernia, reducible.  Rectal:  Not performed.  Msk:  Symmetrical without gross deformities.  Extremities:  Without edema. Neurologic:  Alert and  oriented x4;  grossly normal neurologically. Skin:  Intact without significant lesions or rashes. Cervical Nodes:  No significant cervical adenopathy. Psych:   Alert and cooperative. Normal affect.  LAB RESULTS:  Recent Labs  11/02/13 0550 11/03/13 0532 11/04/13 0550  WBC 9.1 10.1  --   HGB 8.4* 7.8* 8.5*  HCT 25.8* 24.1* 26.6*  PLT 735* 693*  --    BMET  Recent Labs  11/02/13 0550 11/03/13 0532 11/04/13 0550  NA 139 141 139  K 4.6 4.1 4.3  CL 100 102 102  CO2 '28 25 26  ' GLUCOSE 95 93 98  BUN '19 15 12  ' CREATININE 2.05* 1.98* 1.99*  CALCIUM 9.0 8.5 9.0  PREVIOUS ENDOSCOPIES:            none   Impression / Plan:   67. 58 year old female with recently diagnosed inflammatory breast cancer / breast abscess. She is s/p right mastectomy / lymph node dissection. For outpatient chemotherapy  2. Acute nausea and vomiting. Nauseated Wed and yesterday then vomited early this am. Tolerated clears for breakfast and lunch today. No chronic GI problems. Narcotics could be contributing. Would try Ultram for pain. She has a large abdominal wall hernia but it is nontender and reducible so likely unrelated. Continue prn IV zofran. Will check on her again tomorrow.    Thanks   LOS: 13 days   Willia Craze  11/04/2013, 1:56 PM     Attending physician's note   I have taken a history, examined the patient and reviewed the chart. I agree with the Advanced Practitioner's note, impression and recommendations. Nausea and vomiting possibly from opioids. Minimize or avoid opioids. Consider change to Ultram and continue IV Zofran. Add Phenergan if Zofran not fully effective.   Ladene Artist, MD [facility]

## 2013-11-04 NOTE — Progress Notes (Signed)
Pt vomited x1 with green/bile looking emesis approximately 800cc.Marland KitchenAbdomen distended but soft and hypoactive bowel sounds.Denies pain. Prn Zofran 4 mg IV given. Will monitor.

## 2013-11-04 NOTE — Progress Notes (Signed)
[facility]                                                                              Patient Demographics  Carmen Stone, is a 58 y.o. female, DOB - December 18, 1955, HYQ:[id number]  Admit date - 10/22/2013   Admitting Physician [name], MD  Outpatient Primary MD for the patient is [name], MD  LOS - 22   Chief Complaint  Patient presents with  . Breast Discharge        Assessment & Plan  Intractable nausea and vomiting -Unknown cause at this time -Will change diet to clear liquid -Continue anti-emetic -Will consult gastroenterology  Acute renal failure -FENa 1.3%, likely ATN from vancomycin, bactrim -Creatinine appears to be improving -Continue normal saline at 75 cc per hour and to monitor BMP -Bactrim discontinued -Avoid nephrotoxic agents  Acute respiratory or hypoxia -Resolved -Chest x-ray are unremarkable  Inflammatory breast cancer/breast abscess -Status post radial mastectomy with lymph node dissection -Surgery following, drains in place -CT scan of the metastasis -Bactrim discontinued on 11/02/13 -Continue on Ceftin 500 mg twice a day  Diabetes mellitus type 2 -Continue insulin sliding scale, Lantus, CBG monitoring -Hemoglobin A1c 11.8 (10/22/2013) -Spoke with Diabetes coordinator, will place patient on Lantus 10units daily and Tradjenta -CBGs under good control  Postoperative anemia due to acute blood loss -Baseline hemoglobin appears to be benign -Patient was transfused 2 units of packed red blood cells on 5/12 -Hemoglobin currently 8.5 -Continue to monitor CBC -Patient currently hemodynamically stable  Urinary tract infection -UA on 10/22/2013: few bacteria, 1-20 white blood cells, small leukocytes -Urine culture: 30K colonies, nonspecific -Patient appears patient received adequate amount of antibiotic average  Code Status: Full  Family Communication: Daughter at bedside  Disposition Plan: Admitted, likely  discharge once renal function improves  Time Spent in minutes   25 minutes  Procedures  Right mastectomy  Consults   Surgery Gastroenterology  DVT Prophylaxis  Lovenox   Lab Results  Component Value Date   PLT 693* 11/03/2013    Medications  Scheduled Meds: . cefUROXime  500 mg Oral BID WC  . enoxaparin (LOVENOX) injection  40 mg Subcutaneous Q24H  . insulin aspart  0-20 Units Subcutaneous TID WC  . insulin aspart  0-5 Units Subcutaneous QHS  . insulin glargine  10 Units Subcutaneous Daily  . linagliptin  5 mg Oral Daily  . sodium chloride  3 mL Intravenous Q12H   Continuous Infusions: . sodium chloride 75 mL/hr at 11/02/13 2200   PRN Meds:.sodium chloride, acetaminophen, alum & mag hydroxide-simeth, bisacodyl, diphenhydrAMINE, morphine injection, ondansetron (ZOFRAN) IV, oxyCODONE, polyethylene glycol, sodium chloride  Antibiotics    Anti-infectives   Start     Dose/Rate Route Frequency Ordered Stop   11/02/13 1800  cefUROXime (CEFTIN) tablet 500 mg     500 mg Oral 2 times daily with meals 11/02/13 1143     10/31/13 1000  sulfamethoxazole-trimethoprim (BACTRIM DS) 800-160 MG per tablet 1 tablet  Status:  Discontinued     1 tablet Oral Every 12 hours 10/31/13 0921 11/02/13 1143   10/30/13 1000  vancomycin (VANCOCIN) IVPB 1000 mg/200 mL premix  Status:  Discontinued  1,000 mg 200 mL/hr over 60 Minutes Intravenous Every 24 hours 10/29/13 1102 10/30/13 1316   10/22/13 1215  clindamycin (CLEOCIN) IVPB 600 mg  Status:  Discontinued     600 mg 100 mL/hr over 30 Minutes Intravenous 4 times per day 10/22/13 1136 10/31/13 0921   10/22/13 1215  vancomycin (VANCOCIN) IVPB 1000 mg/200 mL premix  Status:  Discontinued     1,000 mg 200 mL/hr over 60 Minutes Intravenous Every 12 hours 10/22/13 1142 10/29/13 1102   10/22/13 0430  ceFEPIme (MAXIPIME) 1 g in dextrose 5 % 50 mL IVPB     1 g 100 mL/hr over 30 Minutes Intravenous  Once 10/22/13 0423 10/22/13 0552   10/22/13 0430   vancomycin (VANCOCIN) IVPB 1000 mg/200 mL premix  Status:  Discontinued     1,000 mg 200 mL/hr over 60 Minutes Intravenous  Once 10/22/13 0423 10/22/13 1142   10/22/13 0430  metroNIDAZOLE (FLAGYL) IVPB 500 mg  Status:  Discontinued     500 mg 100 mL/hr over 60 Minutes Intravenous  Once 10/22/13 0423 10/22/13 0424   10/22/13 0430  [MAR Hold]  clindamycin (CLEOCIN) IVPB 900 mg     (On MAR Hold since 10/22/13 0615)   900 mg 100 mL/hr over 30 Minutes Intravenous  Once 10/22/13 0424 10/30/13 2103      Subjective:   Carmen Stone seen and examined today.  Patient states she had nausea and vomiting overnight.  She denies any abdominal pain at this time.  She denies any chest pain or shortness of breath.   Objective:   Filed Vitals:   11/02/13 2104 11/03/13 0526 11/03/13 2300 11/04/13 0545  BP: 127/57 123/55 126/47 146/86  Pulse: 65 69 69 70  Temp: 98 F (36.7 C) 98.9 F (37.2 C) 98.1 F (36.7 C) 99 F (37.2 C)  TempSrc: Oral  Oral Oral  Resp: 17 16 17 16   Height:      Weight:      SpO2: 97% 95% 93% 93%    Wt Readings from Last 3 Encounters:  10/22/13 74.532 kg (164 lb 5 oz)  10/22/13 74.532 kg (164 lb 5 oz)  10/22/13 74.532 kg (164 lb 5 oz)     Intake/Output Summary (Last 24 hours) at 11/04/13 2458 Last data filed at 11/04/13 0600  Gross per 24 hour  Intake   1800 ml  Output    111 ml  Net   1689 ml    Exam  General: Well developed, well nourished, NAD, appears stated age  56: NCAT, mucous membranes moist.  Neck: Supple, no JVD, no masses  Cardiovascular: S1 S2 auscultated, no rubs, murmurs or gallops. Regular rate and rhythm.  Respiratory: Clear to auscultation bilaterally with equal chest rise  Abdomen: Soft, obese, +hernia, nontender, nondistended, + bowel sounds  Extremities: warm dry without cyanosis clubbing or edema  Neuro: AAOx3, no focal deficits  Skin: Without rashes exudates or nodules  Psych: Normal affect and demeanor with intact  judgement and insight  Data Review   Micro Results Recent Results (from the past 240 hour(s))  URINE CULTURE     Status: None   Collection Time    10/25/13  7:41 PM      Result Value Ref Range Status   Specimen Description URINE, RANDOM   Final   Special Requests NONE   Final   Culture  Setup Time     Final   Value: 10/26/2013 00:49     Performed at [facility]  Colony Count     Final   Value: 30,000 COLONIES/ML     Performed at [facility]   Culture     Final   Value: Multiple bacterial morphotypes present, none predominant. Suggest appropriate recollection if clinically indicated.     Performed at [facility]   Report Status 10/26/2013 FINAL   Final    Radiology Reports Dg Chest 2 View  10/22/2013   CLINICAL DATA:  Right breast pain and swelling.  Fever.  EXAM: CHEST  2 VIEW  COMPARISON:  06/13/2010  FINDINGS: The right breast is asymmetrically enlarged with extensive subcutaneous gas. This creates increased density over the right chest. There is no evidence of pneumonia in the lateral projection. No cardiomegaly. No edema, effusion, or pneumothorax.  Critical Value/emergent results were called by telephone at the time of interpretation on 10/22/2013 at 4:17 AM to Dr. Kathrynn Humble , who verbally acknowledged these results.  IMPRESSION: Right breast swelling with extensive subcutaneous gas. If no recent intervention, findings concerning for necrotizing infection.   Electronically Signed   By: Jorje Guild M.D.   On: 10/22/2013 04:17   Ct Chest W Contrast  10/26/2013   CLINICAL DATA:  58 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 Evaluate for metastatic disease.  EXAM: CT CHEST, ABDOMEN, AND PELVIS WITH CONTRAST  TECHNIQUE: Multidetector CT imaging of the chest, abdomen and pelvis was performed following the standard protocol during bolus administration of intravenous contrast.  CONTRAST:  130mL OMNIPAQUE IOHEXOL 300 MG/ML  SOLN  COMPARISON:  CT  of abdomen pelvis 06/21/2010.  FINDINGS: CT CHEST FINDINGS  Mediastinum: Heart size is normal. There is no significant pericardial fluid, thickening or pericardial calcification. No pathologically enlarged mediastinal, bilateral hilar or internal mammary lymph nodes. Esophagus is unremarkable in appearance.  Lungs/Pleura: Trace bilateral pleural effusions layering dependently with some mild dependent subsegmental atelectasis in the lower lobes of the lungs bilaterally. No acute consolidative airspace disease. No suspicious appearing pulmonary nodules or masses.  Musculoskeletal: Postoperative changes in the right breast are noted, with a large open wound filled with packing material. The periphery of the open wound demonstrates a thick rind of soft tissue, some of which appears to enhance. In the medial aspect of the right breast (image 27 of series 2) there is a 1.5 x 1.3 cm enhancing soft tissue lesion, suspicious for a focus of residual tumor or a malignant lymph node. Numerous borderline enlarged and mildly enlarged right axillary lymph nodes are noted, measuring up to 12 mm in short axis. There are no aggressive appearing lytic or blastic lesions noted in the visualized portions of the skeleton.  CT ABDOMEN AND PELVIS FINDINGS  Abdomen/Pelvis: Several well-defined low-attenuation lesions in the liver appear unchanged in size, number and pattern of distribution compared to prior study 2012, presumably small cysts. The largest of these lesions measures 2.1 cm in segment 4A. No other suspicious hepatic lesions are noted. Status post cholecystectomy. The appearance of the pancreas, spleen, bilateral adrenal glands and bilateral kidneys is unremarkable.  Normal appendix (retrocecal in position). No significant volume of ascites. No pneumoperitoneum. No pathologic distention of small bowel. There are a few colonic diverticulae, particularly in the region of the sigmoid colon, without surrounding inflammatory changes  to suggest an acute diverticulitis at this time. Several small ventral hernias are noted, containing several loops of small bowel and a short segment of the mid transverse colon, without evidence of bowel incarceration or obstruction at this time. No lymphadenopathy identified within the  abdomen or pelvis. Status post hysterectomy. Ovaries are not confidently identified may be surgically absent or atrophic.  Musculoskeletal: There are no aggressive appearing lytic or blastic lesions noted in the visualized portions of the skeleton. Edema throughout the fat of the right flank, presumably reactive to the process in the right breast.  IMPRESSION: 1. Postoperative changes of recent right breast surgery with packed open wound which has a thick rind of soft tissue, some of which enhances, a 1.3 x 1.5 cm enhancing lesion in the medial aspect of the right breast which may represent residual tumor or a malignant lymph node, and multiple enlarged right axillary nodes concerning for axillary nodal metastases. 2. No other definite signs of metastatic disease in the chest, abdomen or pelvis. 3. Multiple small ventral hernias containing several loops of small bowel, as well as the mid transverse colon, without associated bowel incarceration or obstruction at this time. 4. Trace bilateral pleural effusions with mild dependent subsegmental atelectasis in the lower lobes of the lungs bilaterally. 5. Status post cholecystectomy and hysterectomy. 6. Additional incidental findings, as above.   Electronically Signed   By: Vinnie Langton M.D.   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 Evaluate for metastatic disease.  EXAM: CT CHEST, ABDOMEN, AND PELVIS WITH CONTRAST  TECHNIQUE: Multidetector CT imaging of the chest, abdomen and pelvis was performed following the standard protocol during bolus administration of intravenous  contrast.  CONTRAST:  114mL OMNIPAQUE IOHEXOL 300 MG/ML  SOLN  COMPARISON:  CT of abdomen pelvis 06/21/2010.  FINDINGS: CT CHEST FINDINGS  Mediastinum: Heart size is normal. There is no significant pericardial fluid, thickening or pericardial calcification. No pathologically enlarged mediastinal, bilateral hilar or internal mammary lymph nodes. Esophagus is unremarkable in appearance.  Lungs/Pleura: Trace bilateral pleural effusions layering dependently with some mild dependent subsegmental atelectasis in the lower lobes of the lungs bilaterally. No acute consolidative airspace disease. No suspicious appearing pulmonary nodules or masses.  Musculoskeletal: Postoperative changes in the right breast are noted, with a large open wound filled with packing material. The periphery of the open wound demonstrates a thick rind of soft tissue, some of which appears to enhance. In the medial aspect of the right breast (image 27 of series 2) there is a 1.5 x 1.3 cm enhancing soft tissue lesion, suspicious for a focus of residual tumor or a malignant lymph node. Numerous borderline enlarged and mildly enlarged right axillary lymph nodes are noted, measuring up to 12 mm in short axis. There are no aggressive appearing lytic or blastic lesions noted in the visualized portions of the skeleton.  CT ABDOMEN AND PELVIS FINDINGS  Abdomen/Pelvis: Several well-defined low-attenuation lesions in the liver appear unchanged in size, number and pattern of distribution compared to prior study 2012, presumably small cysts. The largest of these lesions measures 2.1 cm in segment 4A. No other suspicious hepatic lesions are noted. Status post cholecystectomy. The appearance of the pancreas, spleen, bilateral adrenal glands and bilateral kidneys is unremarkable.  Normal appendix (retrocecal in position). No significant volume of ascites. No pneumoperitoneum. No pathologic distention of small bowel. There are a few colonic diverticulae,  particularly in the region of the sigmoid colon, without surrounding inflammatory changes to suggest an acute diverticulitis at this time. Several small ventral hernias are noted, containing several loops of small bowel and a short segment of the mid transverse colon, without evidence of bowel incarceration  or obstruction at this time. No lymphadenopathy identified within the abdomen or pelvis. Status post hysterectomy. Ovaries are not confidently identified may be surgically absent or atrophic.  Musculoskeletal: There are no aggressive appearing lytic or blastic lesions noted in the visualized portions of the skeleton. Edema throughout the fat of the right flank, presumably reactive to the process in the right breast.  IMPRESSION: 1. Postoperative changes of recent right breast surgery with packed open wound which has a thick rind of soft tissue, some of which enhances, a 1.3 x 1.5 cm enhancing lesion in the medial aspect of the right breast which may represent residual tumor or a malignant lymph node, and multiple enlarged right axillary nodes concerning for axillary nodal metastases. 2. No other definite signs of metastatic disease in the chest, abdomen or pelvis. 3. Multiple small ventral hernias containing several loops of small bowel, as well as the mid transverse colon, without associated bowel incarceration or obstruction at this time. 4. Trace bilateral pleural effusions with mild dependent subsegmental atelectasis in the lower lobes of the lungs bilaterally. 5. Status post cholecystectomy and hysterectomy. 6. Additional incidental findings, as above.   Electronically Signed   By: Vinnie Langton M.D.   On: 10/26/2013 08:35    CBC  Recent Labs Lab 10/30/13 1014 10/31/13 0505 11/01/13 0653 11/02/13 0550 11/03/13 0532 11/04/13 0550  WBC 14.9* 13.4* 11.9* 9.1 10.1  --   HGB 8.3* 7.8* 7.7* 8.4* 7.8* 8.5*  HCT 25.1* 24.0* 23.7* 25.8* 24.1* 26.6*  PLT 729* 725* 709* 735* 693*  --   MCV 87.2 88.2  87.5 89.0 89.3  --   MCH 28.8 28.7 28.4 29.0 28.9  --   MCHC 33.1 32.5 32.5 32.6 32.4  --   RDW 14.3 14.4 14.3 14.5 14.5  --     Chemistries   Recent Labs Lab 10/30/13 1014 10/31/13 0505 11/01/13 0653 11/02/13 0550 11/03/13 0532 11/04/13 0550  NA 138  138 138 139 139 141 139  K 4.0  4.2 4.2 4.2 4.6 4.1 4.3  CL 99  99 101 101 100 102 102  CO2 26  23 24 26 28 25 26   GLUCOSE 178*  175* 131* 127* 95 93 98  BUN 15  15 16 18 19 15 12   CREATININE 1.67*  1.72* 1.74* 1.82* 2.05* 1.98* 1.99*  CALCIUM 8.7  8.7 8.4 8.6 9.0 8.5 9.0  AST 20  --   --   --   --   --   ALT 6  --   --   --   --   --   ALKPHOS 70  --   --   --   --   --   BILITOT <0.2*  --   --   --   --   --    ------------------------------------------------------------------------------------------------------------------ estimated creatinine clearance is 30.1 ml/min (by C-G formula based on Cr of 1.99). ------------------------------------------------------------------------------------------------------------------ No results found for this basename: HGBA1C,  in the last 72 hours ------------------------------------------------------------------------------------------------------------------ No results found for this basename: CHOL, HDL, LDLCALC, TRIG, CHOLHDL, LDLDIRECT,  in the last 72 hours ------------------------------------------------------------------------------------------------------------------ No results found for this basename: TSH, T4TOTAL, FREET3, T3FREE, THYROIDAB,  in the last 72 hours ------------------------------------------------------------------------------------------------------------------ No results found for this basename: VITAMINB12, FOLATE, FERRITIN, TIBC, IRON, RETICCTPCT,  in the last 72 hours  Coagulation profile No results found for this basename: INR, PROTIME,  in the last 168 hours  No results found for this basename: DDIMER,  in the last 72 hours  Cardiac Enzymes No  results found for this basename: CK, CKMB, TROPONINI, MYOGLOBIN,  in the last 168 hours ------------------------------------------------------------------------------------------------------------------ No components found with this basename: POCBNP,     Kashonda Sarkisyan D.O. on 11/04/2013 at 8:28 AM  Between 7am to 7pm - Pager - [phone]  After 7pm go to [email or web address] - password [email or web address]  And look for the night coverage person covering for me after hours  [facility] Office  [phone]

## 2013-11-04 NOTE — Progress Notes (Signed)
General Surgery Note  LOS: 13 days  POD -  9 Days Post-Op  Assessment/Plan: 1.  COMPLETION Right MASTECTOMY, AXILLARY DISSECTION - - 10/26/2013 - [name]  Dr. [name] (10/24/13) - Right total mastectomy   Dr. Redmond Pulling (10/22/13) - I&D and debridement of right breast wound with breast biopsy   For right inflammatory breast cancer   On Ceftin  Doing well from right mastectomy standpoint.  2.  Acute renal failure  Creat - 1.99 - 11/04/2013 3.  Diabetes mellitus 4.  Anemia  Hgb - 8.5 - 11/05/2103 5.  DVT prophylaxis - Lovenox 6.  Isolation for MRSA   Principal Problem:   Inflammatory breast cancer Active Problems:   DIABETES MELLITUS, TYPE II   Breast abscess of female   Postoperative anemia due to acute blood loss   Acute respiratory failure with hypoxia   Abnormal urinalysis   ATN (acute tubular necrosis)   Acute respiratory failure  Subjective:  Nauseated earlier, but looks good now.  [name], daughter, in room. Objective:   Filed Vitals:   11/04/13 0545  BP: 146/86  Pulse: 70  Temp: 99 F (37.2 C)  Resp: 16     Intake/Output from previous day:  05/21 0701 - 05/22 0700 In: 1800 [P.O.:600; I.V.:1200] Out: 111 [Emesis/NG output:1; Drains:110]  Intake/Output this shift:      Physical Exam:   General: WN older AA F who is alert and oriented.    HEENT: Normal. Pupils equal. .   Lungs: Clear   Wound: Looks good.  Drains - #1/2 - 30/80 cc    Lab Results:    Recent Labs  11/02/13 0550 11/03/13 0532 11/04/13 0550  WBC 9.1 10.1  --   HGB 8.4* 7.8* 8.5*  HCT 25.8* 24.1* 26.6*  PLT 735* 693*  --     BMET   Recent Labs  11/03/13 0532 11/04/13 0550  NA 141 139  K 4.1 4.3  CL 102 102  CO2 25 26  GLUCOSE 93 98  BUN 15 12  CREATININE 1.98* 1.99*  CALCIUM 8.5 9.0    PT/INR  No results found for this basename: LABPROT, INR,  in the last 72 hours  ABG  No results found for this basename: PHART, PCO2, PO2, HCO3,  in the last 72  hours   Studies/Results:  No results found.   Anti-infectives:   Anti-infectives   Start     Dose/Rate Route Frequency Ordered Stop   11/02/13 1800  cefUROXime (CEFTIN) tablet 500 mg     500 mg Oral 2 times daily with meals 11/02/13 1143     10/31/13 1000  sulfamethoxazole-trimethoprim (BACTRIM DS) 800-160 MG per tablet 1 tablet  Status:  Discontinued     1 tablet Oral Every 12 hours 10/31/13 0921 11/02/13 1143   10/30/13 1000  vancomycin (VANCOCIN) IVPB 1000 mg/200 mL premix  Status:  Discontinued     1,000 mg 200 mL/hr over 60 Minutes Intravenous Every 24 hours 10/29/13 1102 10/30/13 1316   10/22/13 1215  clindamycin (CLEOCIN) IVPB 600 mg  Status:  Discontinued     600 mg 100 mL/hr over 30 Minutes Intravenous 4 times per day 10/22/13 1136 10/31/13 0921   10/22/13 1215  vancomycin (VANCOCIN) IVPB 1000 mg/200 mL premix  Status:  Discontinued     1,000 mg 200 mL/hr over 60 Minutes Intravenous Every 12 hours 10/22/13 1142 10/29/13 1102   10/22/13 0430  ceFEPIme (MAXIPIME) 1 g in dextrose 5 % 50 mL IVPB  1 g 100 mL/hr over 30 Minutes Intravenous  Once 10/22/13 0423 10/22/13 0552   10/22/13 0430  vancomycin (VANCOCIN) IVPB 1000 mg/200 mL premix  Status:  Discontinued     1,000 mg 200 mL/hr over 60 Minutes Intravenous  Once 10/22/13 0423 10/22/13 1142   10/22/13 0430  metroNIDAZOLE (FLAGYL) IVPB 500 mg  Status:  Discontinued     500 mg 100 mL/hr over 60 Minutes Intravenous  Once 10/22/13 0423 10/22/13 0424   10/22/13 0430  [MAR Hold]  clindamycin (CLEOCIN) IVPB 900 mg     (On MAR Hold since 10/22/13 0615)   900 mg 100 mL/hr over 30 Minutes Intravenous  Once 10/22/13 0424 10/30/13 2103      Alphonsa Overall, MD, FACS Pager: [facility] Office: [phone] 11/04/2013

## 2013-11-05 LAB — CBC
HCT: 25.3 % — ABNORMAL LOW (ref 36.0–46.0)
Hemoglobin: 8.5 g/dL — ABNORMAL LOW (ref 12.0–15.0)
MCH: 29.9 pg (ref 26.0–34.0)
MCHC: 33.6 g/dL (ref 30.0–36.0)
MCV: 89.1 fL (ref 78.0–100.0)
Platelets: 747 10*3/uL — ABNORMAL HIGH (ref 150–400)
RBC: 2.84 MIL/uL — ABNORMAL LOW (ref 3.87–5.11)
RDW: 13.9 % (ref 11.5–15.5)
WBC: 11.6 10*3/uL — ABNORMAL HIGH (ref 4.0–10.5)

## 2013-11-05 LAB — BASIC METABOLIC PANEL
BUN: 12 mg/dL (ref 6–23)
CO2: 23 meq/L (ref 19–32)
Calcium: 8.8 mg/dL (ref 8.4–10.5)
Chloride: 102 mEq/L (ref 96–112)
Creatinine, Ser: 1.75 mg/dL — ABNORMAL HIGH (ref 0.50–1.10)
GFR, EST AFRICAN AMERICAN: 36 mL/min — AB (ref >90)
GFR, EST NON AFRICAN AMERICAN: 31 mL/min — AB (ref >90)
Glucose, Bld: 88 mg/dL (ref 70–99)
Potassium: 4 mEq/L (ref 3.7–5.3)
SODIUM: 138 meq/L (ref 137–147)

## 2013-11-05 LAB — GLUCOSE, CAPILLARY
GLUCOSE-CAPILLARY: 111 mg/dL — AB (ref 70–99)
Glucose-Capillary: 102 mg/dL — ABNORMAL HIGH (ref 70–99)
Glucose-Capillary: 100 mg/dL — ABNORMAL HIGH (ref 70–99)
Glucose-Capillary: 134 mg/dL — ABNORMAL HIGH (ref 70–99)

## 2013-11-05 MED ORDER — PANTOPRAZOLE SODIUM 40 MG PO TBEC
40.0000 mg | DELAYED_RELEASE_TABLET | Freq: Every day | ORAL | Status: DC
  Administered 2013-11-05 – 2013-11-07 (×3): 40 mg via ORAL
  Filled 2013-11-05 (×3): qty 1

## 2013-11-05 NOTE — Progress Notes (Signed)
Patient ID: Carmen Stone, female   DOB: 24-Dec-1955, 58 y.o.   MRN: [id number] 10 Days Post-Op  Subjective: Pt feels well today.  No complaints.  Nausea resolved.  Eating better.  Objective: Vital signs in last 24 hours: Temp:  [98.1 F (36.7 C)-98.6 F (37 C)] 98.1 F (36.7 C) (05/23 0551) Pulse Rate:  [66-72] 68 (05/23 0551) Resp:  [16-17] 16 (05/23 0551) BP: (121-136)/(59-64) 121/62 mmHg (05/23 0551) SpO2:  [95 %-100 %] 97 % (05/23 0551) Last BM Date: 11/03/13  Intake/Output from previous day: 05/22 0701 - 05/23 0700 In: 8242 [P.O.:840; I.V.:603] Out: 48 [Drains:73] Intake/Output this shift:    PE: Chest: incision c/d/i.  Erythema of upper flap essentially resolved.  Inferior flap still with some darkness right at incision line.  Drains with serous output.  Lab Results:   Recent Labs  11/03/13 0532 11/04/13 0550 11/05/13 0445  WBC 10.1  --  11.6*  HGB 7.8* 8.5* 8.5*  HCT 24.1* 26.6* 25.3*  PLT 693*  --  747*   BMET  Recent Labs  11/04/13 0550 11/05/13 0445  NA 139 138  K 4.3 4.0  CL 102 102  CO2 26 23  GLUCOSE 98 88  BUN 12 12  CREATININE 1.99* 1.75*  CALCIUM 9.0 8.8   PT/INR No results found for this basename: LABPROT, INR,  in the last 72 hours CMP     Component Value Date/Time   NA 138 11/05/2013 0445   K 4.0 11/05/2013 0445   CL 102 11/05/2013 0445   CO2 23 11/05/2013 0445   GLUCOSE 88 11/05/2013 0445   BUN 12 11/05/2013 0445   CREATININE 1.75* 11/05/2013 0445   CALCIUM 8.8 11/05/2013 0445   PROT 6.5 10/30/2013 1014   ALBUMIN 2.2* 10/30/2013 1014   AST 20 10/30/2013 1014   ALT 6 10/30/2013 1014   ALKPHOS 70 10/30/2013 1014   BILITOT <0.2* 10/30/2013 1014   GFRNONAA 31* 11/05/2013 0445   GFRAA 36* 11/05/2013 0445   Lipase     Component Value Date/Time   LIPASE 16 06/08/2010 0430       Studies/Results: No results found.  Anti-infectives: Anti-infectives   Start     Dose/Rate Route Frequency Ordered Stop   11/02/13 1800  cefUROXime  (CEFTIN) tablet 500 mg     500 mg Oral 2 times daily with meals 11/02/13 1143     10/31/13 1000  sulfamethoxazole-trimethoprim (BACTRIM DS) 800-160 MG per tablet 1 tablet  Status:  Discontinued     1 tablet Oral Every 12 hours 10/31/13 0921 11/02/13 1143   10/30/13 1000  vancomycin (VANCOCIN) IVPB 1000 mg/200 mL premix  Status:  Discontinued     1,000 mg 200 mL/hr over 60 Minutes Intravenous Every 24 hours 10/29/13 1102 10/30/13 1316   10/22/13 1215  clindamycin (CLEOCIN) IVPB 600 mg  Status:  Discontinued     600 mg 100 mL/hr over 30 Minutes Intravenous 4 times per day 10/22/13 1136 10/31/13 0921   10/22/13 1215  vancomycin (VANCOCIN) IVPB 1000 mg/200 mL premix  Status:  Discontinued     1,000 mg 200 mL/hr over 60 Minutes Intravenous Every 12 hours 10/22/13 1142 10/29/13 1102   10/22/13 0430  ceFEPIme (MAXIPIME) 1 g in dextrose 5 % 50 mL IVPB     1 g 100 mL/hr over 30 Minutes Intravenous  Once 10/22/13 0423 10/22/13 0552   10/22/13 0430  vancomycin (VANCOCIN) IVPB 1000 mg/200 mL premix  Status:  Discontinued     1,000  mg 200 mL/hr over 60 Minutes Intravenous  Once 10/22/13 0423 10/22/13 1142   10/22/13 0430  metroNIDAZOLE (FLAGYL) IVPB 500 mg  Status:  Discontinued     500 mg 100 mL/hr over 60 Minutes Intravenous  Once 10/22/13 0423 10/22/13 0424   10/22/13 0430  [MAR Hold]  clindamycin (CLEOCIN) IVPB 900 mg     (On MAR Hold since 10/22/13 0615)   900 mg 100 mL/hr over 30 Minutes Intravenous  Once 10/22/13 0424 10/30/13 2103       Assessment/Plan  1. COMPLETION Right MASTECTOMY, AXILLARY DISSECTION - - 10/26/2013 - Carmen Stone  Dr. Donne Stone (10/24/13) - Right total mastectomy  Dr. Redmond Pulling (10/22/13) - I&D and debridement of right breast wound with breast biopsy  For right inflammatory breast cancer  On Ceftin, should get another 10 days of abx therapy at home. Doing well from right mastectomy standpoint.  2. Acute renal failure  Creat - 1.75 - 11/05/2013  3. Diabetes mellitus    Plan: 1. Pt doing well.  Surgically stable 2. Our office and cancer center have been contacted about follow up for this patient.  Both places will contact the patient for surgical and oncologic follow up.    LOS: 14 days    Carmen Stone 11/05/2013, 11:10 AM Pager: [phone]

## 2013-11-05 NOTE — Progress Notes (Addendum)
    Progress Note   Subjective   Feels better today. No N/V on clear liquids.   Objective  Vital signs in last 24 hours: Temp:  [98.1 F (36.7 C)-98.6 F (37 C)] 98.1 F (36.7 C) (05/23 0551) Pulse Rate:  [66-72] 68 (05/23 0551) Resp:  [16-17] 16 (05/23 0551) BP: (121-136)/(59-64) 121/62 mmHg (05/23 0551) SpO2:  [95 %-100 %] 97 % (05/23 0551) Last BM Date: 11/03/13  General:   Alert, well-developed,  female in NAD Heart:  Regular rate and rhythm; no murmurs Abdomen:  Soft, nontender and nondistended. Normal bowel sounds, without guarding, and without rebound.   Extremities:  Without edema. Neurologic:  Alert and  oriented x4;  grossly normal neurologically. Psych:  Alert and cooperative. Normal mood and affect.  Intake/Output from previous day: 05/22 0701 - 05/23 0700 In: 1443 [P.O.:840; I.V.:603] Out: 73 [Drains:73] Intake/Output this shift:    Lab Results:  Recent Labs  11/03/13 0532 11/04/13 0550 11/05/13 0445  WBC 10.1  --  11.6*  HGB 7.8* 8.5* 8.5*  HCT 24.1* 26.6* 25.3*  PLT 693*  --  747*   BMET  Recent Labs  11/03/13 0532 11/04/13 0550 11/05/13 0445  NA 141 139 138  K 4.1 4.3 4.0  CL 102 102 102  CO2 25 26 23   GLUCOSE 93 98 88  BUN 15 12 12   CREATININE 1.98* 1.99* 1.75*  CALCIUM 8.5 9.0 8.8      Assessment & Plan   70. 58 year old female with recently diagnosed inflammatory breast cancer / breast abscess. She is s/p right mastectomy / lymph node dissection. For outpatient chemotherapy.   2. Acute nausea and vomiting. Tolerated clears for 24 hours. Advance as tolerated to full liquids. Nausea and vomiting likely from opioids. Minimize or avoid opioids. Try to use only Ultram and continue Zofran. Daily PPI. Add Phenergan if Zofran not fully effective. GI signing off. Please call if needed.    Principal Problem:   Inflammatory breast cancer Active Problems:   DIABETES MELLITUS, TYPE II   Breast abscess of female   Postoperative anemia  due to acute blood loss   Acute respiratory failure with hypoxia   Abnormal urinalysis   ATN (acute tubular necrosis)   Acute respiratory failure   Nausea with vomiting    LOS: 14 days   Ladene Artist MD  11/05/2013, 9:50 AM

## 2013-11-05 NOTE — Progress Notes (Signed)
Patient is interviewed and examined. Agree with the assessment and treatment plan outlined by [name], [place]. Wound is holding together. No sign of infection. Home tomorrow Followup office visit and Port-A-Cath insertion will be scheduled from our office.    Carmen Stone. Dalbert Batman, M.D., [facility] General and Minimally invasive Surgery Breast and Colorectal Surgery Office:   [phone] Pager:   [phone]

## 2013-11-05 NOTE — Progress Notes (Signed)
[facility]                                                                              Patient Demographics  Carmen Stone, is a 58 y.o. female, DOB - [birth year], VEL:[id number]  Admit date - 10/22/2013   Admitting Physician [name], MD  Outpatient Primary MD for the patient is [name], MD  LOS - 63   Chief Complaint  Patient presents with  . Breast Discharge      HPI Carmen Stone who states she was in her usual state of health until she noticed some right breast swelling and pain. Over the past several days the area had worsened and started draining fluid. She denied any trauma to area. She stated that her right breast appeared the same as the left breast until coming into the ER.  She denied any medical history but when she was told by the surgeon that her chart documents her being a diabetic - she stated she was unaware. She states her last mammogram was 5 yrs ago. She denied any family history of breast cancer or other malignancy. She denied any weight loss, LAD, nipple drainage.    Assessment & Plan  Intractable nausea and vomiting -Improving, Unknown cause at this time ?narcotics -Patient was on a clear liquid diet and tolerated that well, will advance diet --Continue anti-emetic -Gastroenterology consulted, felt that her pain medications could be contributing to her nausea and vomiting.  -Patient switched to tramadol.  Acute renal failure -FENa 1.3%, likely ATN from vancomycin, bactrim -Creatinine appears to be improving, Cr 1.75 -Continue normal saline at 75 cc per hour and to monitor BMP -Bactrim discontinued -Avoid nephrotoxic agents  Acute respiratory or hypoxia -Resolved -Chest x-ray are unremarkable  Inflammatory breast cancer/breast abscess -Status post radial mastectomy with lymph node dissection -Surgery following, drains in place -CT scan of the metastasis -Bactrim discontinued on 11/02/13 -Continue on Ceftin 500 mg twice a  day  Diabetes mellitus type 2 -Continue insulin sliding scale, Lantus, CBG monitoring -Hemoglobin A1c 11.8 (10/22/2013) -Spoke with Diabetes coordinator, will place patient on Lantus 10units daily and Tradjenta -CBGs under good control  Postoperative anemia due to acute blood loss -Baseline hemoglobin appears to be benign -Patient was transfused 2 units of packed red blood cells on 5/12 -Hemoglobin currently 8.5 -Continue to monitor CBC -Patient currently hemodynamically stable  Urinary tract infection -UA on 10/22/2013: few bacteria, 1-20 white blood cells, small leukocytes -Urine culture: 30K colonies, nonspecific -Patient appears patient received adequate amount of antibiotic average  Code Status: Full  Family Communication: None at bedside  Disposition Plan: Admitted, likely discharge 5/24  Time Spent in minutes   20 minutes  Procedures  Right mastectomy  Consults   Surgery Gastroenterology  DVT Prophylaxis  Lovenox   Lab Results  Component Value Date   PLT 747* 11/05/2013    Medications  Scheduled Meds: . cefUROXime  500 mg Oral BID WC  . enoxaparin (LOVENOX) injection  40 mg Subcutaneous Q24H  . insulin aspart  0-20 Units Subcutaneous TID WC  . insulin aspart  0-5 Units Subcutaneous QHS  . insulin glargine  10 Units Subcutaneous Daily  .  linagliptin  5 mg Oral Daily  . sodium chloride  3 mL Intravenous Q12H  . traMADol  50 mg Oral 4 times per day   Continuous Infusions: . sodium chloride 75 mL/hr at 11/05/13 0543   PRN Meds:.sodium chloride, acetaminophen, alum & mag hydroxide-simeth, bisacodyl, diphenhydrAMINE, morphine injection, ondansetron (ZOFRAN) IV, oxyCODONE, polyethylene glycol, sodium chloride  Antibiotics    Anti-infectives   Start     Dose/Rate Route Frequency Ordered Stop   11/02/13 1800  cefUROXime (CEFTIN) tablet 500 mg     500 mg Oral 2 times daily with meals 11/02/13 1143     10/31/13 1000  sulfamethoxazole-trimethoprim (BACTRIM  DS) 800-160 MG per tablet 1 tablet  Status:  Discontinued     1 tablet Oral Every 12 hours 10/31/13 0921 11/02/13 1143   10/30/13 1000  vancomycin (VANCOCIN) IVPB 1000 mg/200 mL premix  Status:  Discontinued     1,000 mg 200 mL/hr over 60 Minutes Intravenous Every 24 hours 10/29/13 1102 10/30/13 1316   10/22/13 1215  clindamycin (CLEOCIN) IVPB 600 mg  Status:  Discontinued     600 mg 100 mL/hr over 30 Minutes Intravenous 4 times per day 10/22/13 1136 10/31/13 0921   10/22/13 1215  vancomycin (VANCOCIN) IVPB 1000 mg/200 mL premix  Status:  Discontinued     1,000 mg 200 mL/hr over 60 Minutes Intravenous Every 12 hours 10/22/13 1142 10/29/13 1102   10/22/13 0430  ceFEPIme (MAXIPIME) 1 g in dextrose 5 % 50 mL IVPB     1 g 100 mL/hr over 30 Minutes Intravenous  Once 10/22/13 0423 10/22/13 0552   10/22/13 0430  vancomycin (VANCOCIN) IVPB 1000 mg/200 mL premix  Status:  Discontinued     1,000 mg 200 mL/hr over 60 Minutes Intravenous  Once 10/22/13 0423 10/22/13 1142   10/22/13 0430  metroNIDAZOLE (FLAGYL) IVPB 500 mg  Status:  Discontinued     500 mg 100 mL/hr over 60 Minutes Intravenous  Once 10/22/13 0423 10/22/13 0424   10/22/13 0430  [MAR Hold]  clindamycin (CLEOCIN) IVPB 900 mg     (On MAR Hold since 10/22/13 0615)   900 mg 100 mL/hr over 30 Minutes Intravenous  Once 10/22/13 0424 10/30/13 2103      Subjective:   [name] seen and examined today.  Patient states she has not had any more nausea or vomiting. She's been able to tolerate her clear liquids. Patient would like to have food this morning.  No complaints of chest pain or shortness of breath or abdominal pain.   Objective:   Filed Vitals:   11/04/13 0545 11/04/13 1332 11/04/13 2235 11/05/13 0551  BP: 146/86 136/64 134/59 121/62  Pulse: 70 66 72 68  Temp: 99 F (37.2 C) 98.6 F (37 C) 98.1 F (36.7 C) 98.1 F (36.7 C)  TempSrc: Oral Oral Oral Oral  Resp: 16 17 16 16   Height:      Weight:      SpO2: 93% 100%  95% 97%    Wt Readings from Last 3 Encounters:  10/22/13 74.532 kg (164 lb 5 oz)  10/22/13 74.532 kg (164 lb 5 oz)  10/22/13 74.532 kg (164 lb 5 oz)     Intake/Output Summary (Last 24 hours) at 11/05/13 0808 Last data filed at 11/05/13 0552  Gross per 24 hour  Intake   1443 ml  Output     73 ml  Net   1370 ml    Exam  General: Well developed, well nourished, NAD,  appears stated age  30: NCAT, mucous membranes moist.  Neck: Supple, no JVD, no masses  Cardiovascular: S1 S2 auscultated, no rubs, murmurs or gallops. Regular rate and rhythm.  Respiratory: Clear to auscultation bilaterally with equal chest rise  Abdomen: Soft, obese, +hernia, nontender, nondistended, + bowel sounds  Extremities: warm dry without cyanosis clubbing or edema  Neuro: AAOx3, no focal deficits  Skin: Without rashes exudates or nodules  Psych: Normal affect and demeanor with intact judgement and insight  Data Review   Micro Results No results found for this or any previous visit (from the past 240 hour(s)).  Radiology Reports Dg Chest 2 View  10/22/2013   CLINICAL DATA:  Right breast pain and swelling.  Fever.  EXAM: CHEST  2 VIEW  COMPARISON:  06/13/2010  FINDINGS: The right breast is asymmetrically enlarged with extensive subcutaneous gas. This creates increased density over the right chest. There is no evidence of pneumonia in the lateral projection. No cardiomegaly. No edema, effusion, or pneumothorax.  Critical Value/emergent results were called by telephone at the time of interpretation on 10/22/2013 at 4:17 AM to Dr. Kathrynn Humble , who verbally acknowledged these results.  IMPRESSION: Right breast swelling with extensive subcutaneous gas. If no recent intervention, findings concerning for necrotizing infection.   Electronically Signed   By: Jorje Guild M.D.   On: 10/22/2013 04:17   Ct Chest W Contrast  10/26/2013   CLINICAL DATA:  58 year old female with history of inflammatory breast  cancer. Evaluate for metastatic disease.  EXAM: CT CHEST, ABDOMEN, AND PELVIS WITH CONTRAST  TECHNIQUE: Multidetector CT imaging of the chest, abdomen and pelvis was performed following the standard protocol during bolus administration of intravenous contrast.  CONTRAST:  169mL OMNIPAQUE IOHEXOL 300 MG/ML  SOLN  COMPARISON:  CT of abdomen pelvis 06/21/2010.  FINDINGS: CT CHEST FINDINGS  Mediastinum: Heart size is normal. There is no significant pericardial fluid, thickening or pericardial calcification. No pathologically enlarged mediastinal, bilateral hilar or internal mammary lymph nodes. Esophagus is unremarkable in appearance.  Lungs/Pleura: Trace bilateral pleural effusions layering dependently with some mild dependent subsegmental atelectasis in the lower lobes of the lungs bilaterally. No acute consolidative airspace disease. No suspicious appearing pulmonary nodules or masses.  Musculoskeletal: Postoperative changes in the right breast are noted, with a large open wound filled with packing material. The periphery of the open wound demonstrates a thick rind of soft tissue, some of which appears to enhance. In the medial aspect of the right breast (image 27 of series 2) there is a 1.5 x 1.3 cm enhancing soft tissue lesion, suspicious for a focus of residual tumor or a malignant lymph node. Numerous borderline enlarged and mildly enlarged right axillary lymph nodes are noted, measuring up to 12 mm in short axis. There are no aggressive appearing lytic or blastic lesions noted in the visualized portions of the skeleton.  CT ABDOMEN AND PELVIS FINDINGS  Abdomen/Pelvis: Several well-defined low-attenuation lesions in the liver appear unchanged in size, number and pattern of distribution compared to prior study 2012, presumably small cysts. The largest of these lesions measures 2.1 cm in segment 4A. No other suspicious hepatic lesions are noted. Status post cholecystectomy. The appearance of the pancreas, spleen,  bilateral adrenal glands and bilateral kidneys is unremarkable.  Normal appendix (retrocecal in position). No significant volume of ascites. No pneumoperitoneum. No pathologic distention of small bowel. There are a few colonic diverticulae, particularly in the region of the sigmoid colon, without surrounding inflammatory changes to suggest an acute  diverticulitis at this time. Several small ventral hernias are noted, containing several loops of small bowel and a short segment of the mid transverse colon, without evidence of bowel incarceration or obstruction at this time. No lymphadenopathy identified within the abdomen or pelvis. Status post hysterectomy. Ovaries are not confidently identified may be surgically absent or atrophic.  Musculoskeletal: There are no aggressive appearing lytic or blastic lesions noted in the visualized portions of the skeleton. Edema throughout the fat of the right flank, presumably reactive to the process in the right breast.  IMPRESSION: 1. Postoperative changes of recent right breast surgery with packed open wound which has a thick rind of soft tissue, some of which enhances, a 1.3 x 1.5 cm enhancing lesion in the medial aspect of the right breast which may represent residual tumor or a malignant lymph node, and multiple enlarged right axillary nodes concerning for axillary nodal metastases. 2. No other definite signs of metastatic disease in the chest, abdomen or pelvis. 3. Multiple small ventral hernias containing several loops of small bowel, as well as the mid transverse colon, without associated bowel incarceration or obstruction at this time. 4. Trace bilateral pleural effusions with mild dependent subsegmental atelectasis in the lower lobes of the lungs bilaterally. 5. Status post cholecystectomy and hysterectomy. 6. Additional incidental findings, as above.   Electronically Signed   By: Vinnie Langton M.D.   On: 10/26/2013 08:35   Ct Abdomen Pelvis W Contrast  10/26/2013    CLINICAL DATA:  58 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 Evaluate for metastatic disease.  EXAM: CT CHEST, ABDOMEN, AND PELVIS WITH CONTRAST  TECHNIQUE: Multidetector CT imaging of the chest, abdomen and pelvis was performed following the standard protocol during bolus administration of intravenous contrast.  CONTRAST:  11mL OMNIPAQUE IOHEXOL 300 MG/ML  SOLN  COMPARISON:  CT of abdomen pelvis 06/21/2010.  FINDINGS: CT CHEST FINDINGS  Mediastinum: Heart size is normal. There is no significant pericardial fluid, thickening or pericardial calcification. No pathologically enlarged mediastinal, bilateral hilar or internal mammary lymph nodes. Esophagus is unremarkable in appearance.  Lungs/Pleura: Trace bilateral pleural effusions layering dependently with some mild dependent subsegmental atelectasis in the lower lobes of the lungs bilaterally. No acute consolidative airspace disease. No suspicious appearing pulmonary nodules or masses.  Musculoskeletal: Postoperative changes in the right breast are noted, with a large open wound filled with packing material. The periphery of the open wound demonstrates a thick rind of soft tissue, some of which appears to enhance. In the medial aspect of the right breast (image 27 of series 2) there is a 1.5 x 1.3 cm enhancing soft tissue lesion, suspicious for a focus of residual tumor or a malignant lymph node. Numerous borderline enlarged and mildly enlarged right axillary lymph nodes are noted, measuring up to 12 mm in short axis. There are no aggressive appearing lytic or blastic lesions noted in the visualized portions of the skeleton.  CT ABDOMEN AND PELVIS FINDINGS  Abdomen/Pelvis: Several well-defined low-attenuation lesions in the liver appear unchanged in size, number and pattern of distribution compared to prior study 2012, presumably small cysts. The largest of these lesions measures 2.1 cm in segment 4A. No other suspicious hepatic lesions are  noted. Status post cholecystectomy. The appearance of the pancreas, spleen, bilateral adrenal glands and bilateral kidneys is unremarkable.  Normal appendix (retrocecal in position). No significant volume of ascites. No pneumoperitoneum. No pathologic distention of small bowel. There are a few colonic diverticulae, particularly in the region of the  sigmoid colon, without surrounding inflammatory changes to suggest an acute diverticulitis at this time. Several small ventral hernias are noted, containing several loops of small bowel and a short segment of the mid transverse colon, without evidence of bowel incarceration or obstruction at this time. No lymphadenopathy identified within the abdomen or pelvis. Status post hysterectomy. Ovaries are not confidently identified may be surgically absent or atrophic.  Musculoskeletal: There are no aggressive appearing lytic or blastic lesions noted in the visualized portions of the skeleton. Edema throughout the fat of the right flank, presumably reactive to the process in the right breast.  IMPRESSION: 1. Postoperative changes of recent right breast surgery with packed open wound which has a thick rind of soft tissue, some of which enhances, a 1.3 x 1.5 cm enhancing lesion in the medial aspect of the right breast which may represent residual tumor or a malignant lymph node, and multiple enlarged right axillary nodes concerning for axillary nodal metastases. 2. No other definite signs of metastatic disease in the chest, abdomen or pelvis. 3. Multiple small ventral hernias containing several loops of small bowel, as well as the mid transverse colon, without associated bowel incarceration or obstruction at this time. 4. Trace bilateral pleural effusions with mild dependent subsegmental atelectasis in the lower lobes of the lungs bilaterally. 5. Status post cholecystectomy and hysterectomy. 6. Additional incidental findings, as above.   Electronically Signed   By: Vinnie Langton M.D.   On: 10/26/2013 08:35    CBC  Recent Labs Lab 10/31/13 0505 11/01/13 0653 11/02/13 0550 11/03/13 0532 11/04/13 0550 11/05/13 0445  WBC 13.4* 11.9* 9.1 10.1  --  11.6*  HGB 7.8* 7.7* 8.4* 7.8* 8.5* 8.5*  HCT 24.0* 23.7* 25.8* 24.1* 26.6* 25.3*  PLT 725* 709* 735* 693*  --  747*  MCV Carmen.2 87.5 89.0 89.3  --  89.1  MCH 28.7 28.4 29.0 28.9  --  29.9  MCHC 32.5 32.5 32.6 32.4  --  33.6  RDW 14.4 14.3 14.5 14.5  --  13.9    Chemistries   Recent Labs Lab 10/30/13 1014  11/01/13 0653 11/02/13 0550 11/03/13 0532 11/04/13 0550 11/05/13 0445  NA 138  138  < > 139 139 141 139 138  K 4.0  4.2  < > 4.2 4.6 4.1 4.3 4.0  CL 99  99  < > 101 100 102 102 102  CO2 26  23  < > 26 28 25 26 23   GLUCOSE 178*  175*  < > 127* 95 93 98 Carmen  BUN 15  15  < > 18 19 15 12 12   CREATININE 1.67*  1.72*  < > 1.82* 2.05* 1.98* 1.99* 1.75*  CALCIUM 8.7  8.7  < > 8.6 9.0 8.5 9.0 8.8  AST 20  --   --   --   --   --   --   ALT 6  --   --   --   --   --   --   ALKPHOS 70  --   --   --   --   --   --   BILITOT <0.2*  --   --   --   --   --   --   < > = values in this interval not displayed. ------------------------------------------------------------------------------------------------------------------ estimated creatinine clearance is 34.2 ml/min (by C-G formula based on Cr of 1.75). ------------------------------------------------------------------------------------------------------------------ No results found for this basename: HGBA1C,  in the last 72 hours ------------------------------------------------------------------------------------------------------------------ No  results found for this basename: CHOL, HDL, LDLCALC, TRIG, CHOLHDL, LDLDIRECT,  in the last 72 hours ------------------------------------------------------------------------------------------------------------------ No results found for this basename: TSH, T4TOTAL, FREET3, T3FREE, THYROIDAB,  in the last 72  hours ------------------------------------------------------------------------------------------------------------------ No results found for this basename: VITAMINB12, FOLATE, FERRITIN, TIBC, IRON, RETICCTPCT,  in the last 72 hours  Coagulation profile No results found for this basename: INR, PROTIME,  in the last 168 hours  No results found for this basename: DDIMER,  in the last 72 hours  Cardiac Enzymes No results found for this basename: CK, CKMB, TROPONINI, MYOGLOBIN,  in the last 168 hours ------------------------------------------------------------------------------------------------------------------ No components found with this basename: POCBNP,     Logan Baltimore D.O. on 11/05/2013 at 8:08 AM  Between 7am to 7pm - Pager - [phone]  After 7pm go to [email or web address] - password [email or web address]  And look for the night coverage person covering for me after hours  [facility] Office  [phone]

## 2013-11-06 DIAGNOSIS — R0902 Hypoxemia: Secondary | ICD-10-CM

## 2013-11-06 LAB — BASIC METABOLIC PANEL
BUN: 10 mg/dL (ref 6–23)
CO2: 24 mEq/L (ref 19–32)
Calcium: 8.7 mg/dL (ref 8.4–10.5)
Chloride: 101 mEq/L (ref 96–112)
Creatinine, Ser: 1.49 mg/dL — ABNORMAL HIGH (ref 0.50–1.10)
GFR calc Af Amer: 43 mL/min — ABNORMAL LOW (ref >90)
GFR, EST NON AFRICAN AMERICAN: 37 mL/min — AB (ref >90)
GLUCOSE: 82 mg/dL (ref 70–99)
POTASSIUM: 4.5 meq/L (ref 3.7–5.3)
Sodium: 138 mEq/L (ref 137–147)

## 2013-11-06 LAB — GLUCOSE, CAPILLARY
GLUCOSE-CAPILLARY: 77 mg/dL (ref 70–99)
GLUCOSE-CAPILLARY: 119 mg/dL — AB (ref 70–99)
GLUCOSE-CAPILLARY: 110 mg/dL — AB (ref 70–99)
Glucose-Capillary: 108 mg/dL — ABNORMAL HIGH (ref 70–99)

## 2013-11-06 NOTE — Progress Notes (Signed)
Pt demonstrated correct technique for emptying 2 JP drains into measuring container.  Pt was able to strip the 2 drains and understands the importance of emptying the drains and keeping a record of the drainage to take back to the doctors office.   Pt states that she had a CBG meter at home but is unable to find it now.  Encouraged pt to obtain meter at [facility] once discharged.  CBG readings have not required any sliding scale insulin all weekend but pt is receiving the 10 units of Lantus.   Pt's vision seems compromised and it may be difficult for her to see the needle in pulling up insulin.  Reviewed insulin administration technique with pt.

## 2013-11-06 NOTE — Discharge Summary (Signed)
Physician Discharge Summary  ALAINAH PHANG CBJ:[id number] DOB: October 14, 1955 DOA: 10/22/2013  PCP: [name], MD  Admit date: 10/22/2013 Discharge date: 11/07/2013  Time spent: 45 minutes  Recommendations for Outpatient Follow-up:  Patient will be discharged to home with home health. She is to followup with her primary care physician within one week of discharge for diabetes management. Patient will be contacted by Dr. Cristal Generous office, surgery, for followup appointment. Port-A-Cath will also be arranged by surgery. Patient should continue her medications as prescribed.  Discharge Diagnoses:  Intractable nausea and vomiting Acute renal failure Acute respiratory failure with hypoxia Inflammatory breast cancer/breast abscess Diabetes mellitus type 2 Postoperative anemia due to acute blood loss Urinary tract infection  Discharge Condition: Stable  Diet recommendation: Carb modified  Filed Weights   10/22/13 0152  Weight: 74.532 kg (164 lb 5 oz)    History of present illness:  58 yo AAF who states she was in her usual state of health until she noticed some right breast swelling and pain. Over the past several days the area had worsened and started draining fluid. She denied any trauma to area. She stated that her right breast appeared the same as the left breast until coming into the ER. She denied any medical history but when she was told by the surgeon that her chart documents her being a diabetic - she stated she was unaware. She states her last mammogram was 5 yrs ago. She denied any family history of breast cancer or other malignancy. She denied any weight loss, LAD, nipple drainage.   Hospital Course:  Intractable nausea and vomiting  -Improving, Unknown cause at this time ?narcotics  -Patient was on a liquid diet, will advance to carb modifed  -Gastroenterology consulted, felt that her pain medications could be contributing to her nausea and vomiting.  -Continue  tramadol.   Acute renal failure  -FENa 1.3%, likely ATN from vancomycin, bactrim  -Creatinine appears to be improving, Cr 1.49  -Will discontinue fluids. Continue to monitor BMP  -Bactrim discontinued  -Avoid nephrotoxic agents   Acute respiratory with hypoxia  -Resolved  -Chest x-ray are unremarkable   Inflammatory breast cancer/breast abscess  -Status post radial mastectomy with lymph node dissection  -Surgery following, drains in place  -CT scan of the metastasis  -Bactrim discontinued on 11/02/13  -Continue on Ceftin 500 mg twice a day (2 weeks total) -Patient will need followup with Dr. [name], surgeon. -Port-A-Cath insertion will also be arranged by surgical team.  Diabetes mellitus type 2  -Continue insulin sliding scale, Lantus -Hemoglobin A1c 11.8 (10/22/2013)  -Spoke with Diabetes coordinator -Continue on Lantus 10units daily and Tradjenta  -CBGs under good control   Postoperative anemia due to acute blood loss  -Baseline hemoglobin appears to be benign  -Patient was transfused 2 units of packed red blood cells on 5/12  -Hemoglobin stable -Continue to monitor CBC  -Patient currently hemodynamically stable   Urinary tract infection  -UA on 10/22/2013: few bacteria, 1-20 white blood cells, small leukocytes  -Urine culture: 30K colonies, nonspecific  -Patient appears patient received adequate amount of antibiotic average  Procedures: Right mastectomy  Consultations: Surgery Gastroenterology  Discharge Exam: Filed Vitals:   11/07/13 0851  BP: 140/56  Pulse: 71  Temp: 98.8 F (37.1 C)  Resp: 16   Exam  General: Well developed, well nourished, NAD, appears stated age  HEENT: NCAT, mucous membranes moist.  Neck: Supple, no JVD, no masses  Cardiovascular: S1 S2 auscultated, no rubs, murmurs or gallops.  Regular rate and rhythm.  Respiratory: Clear to auscultation bilaterally with equal chest rise  Abdomen: Soft, obese, +hernia, nontender,  nondistended, + bowel sounds  Extremities: warm dry without cyanosis clubbing  Neuro: AAOx3, no focal deficits  Skin: Without rashes exudates or nodules  Psych: Normal affect and demeanor with intact judgement and insight  Discharge Instructions      Discharge Instructions   Diet Carb Modified    Complete by:  As directed      Discharge instructions    Complete by:  As directed   Patient will be discharged to home with home health, RN. She is to followup with her primary care physician within one week of discharge for diabetes management. Patient will be contacted by Dr. Cristal Generous office, surgery, for followup appointment. Port-A-Cath will also be arranged by surgery. Patient should continue her medications as prescribed.     Increase activity slowly    Complete by:  As directed             Medication List         acetaminophen 325 MG tablet  Commonly known as:  TYLENOL  Take 650 mg by mouth every 6 (six) hours as needed for mild pain.     cefUROXime 500 MG tablet  Commonly known as:  CEFTIN  Take 1 tablet (500 mg total) by mouth 2 (two) times daily with a meal.     Insulin Glargine 100 UNIT/ML Solostar Pen  Commonly known as:  LANTUS SOLOSTAR  Inject 10 Units into the skin daily at 10 pm.     linagliptin 5 MG Tabs tablet  Commonly known as:  TRADJENTA  Take 1 tablet (5 mg total) by mouth daily.     oxyCODONE 5 MG immediate release tablet  Commonly known as:  Oxy IR/ROXICODONE  Take 1-2 tablets (5-10 mg total) by mouth every 4 (four) hours as needed for moderate pain, severe pain or breakthrough pain.     pantoprazole 40 MG tablet  Commonly known as:  PROTONIX  Take 1 tablet (40 mg total) by mouth daily at 6 (six) AM.     traMADol 50 MG tablet  Commonly known as:  ULTRAM  Take 1 tablet (50 mg total) by mouth every 6 (six) hours.       No Known Allergies Follow-up Information   Follow up with [name], MD. (our office will call you)    Specialty:   General Surgery   Contact information:   [address] [place] [address] [phone]       Follow up with Chauncey Cruel, MD. (they will call you)    Specialty:  Oncology   Contact information:   [place] [address] [phone]       Follow up with [name], MD. Schedule an appointment as soon as possible for a visit in 1 week. [facility] followup)    Specialty:  Family Medicine   Contact information:   [place] [address] [phone]        The results of significant diagnostics from this hospitalization (including imaging, microbiology, ancillary and laboratory) are listed below for reference.    Significant Diagnostic Studies: Dg Chest 2 View  10/22/2013   CLINICAL DATA:  Right breast pain and swelling.  Fever.  EXAM: CHEST  2 VIEW  COMPARISON:  06/13/2010  FINDINGS: The right breast is asymmetrically enlarged with extensive subcutaneous gas. This creates increased density over  the right chest. There is no evidence of pneumonia in the lateral projection. No cardiomegaly. No edema, effusion, or pneumothorax.  Critical Value/emergent results were called by telephone at the time of interpretation on 10/22/2013 at 4:17 AM to Dr. Kathrynn Humble , who verbally acknowledged these results.  IMPRESSION: Right breast swelling with extensive subcutaneous gas. If no recent intervention, findings concerning for necrotizing infection.   Electronically Signed   By: Jorje Guild M.D.   On: 10/22/2013 04:17   Ct Chest W Contrast  10/26/2013   CLINICAL DATA:  58 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 Evaluate for metastatic disease.  EXAM: CT CHEST, ABDOMEN, AND PELVIS WITH CONTRAST  TECHNIQUE: Multidetector CT imaging of the chest, abdomen and pelvis was performed following the standard protocol during bolus administration of intravenous contrast.  CONTRAST:  116mL OMNIPAQUE IOHEXOL 300 MG/ML  SOLN   COMPARISON:  CT of abdomen pelvis 06/21/2010.  FINDINGS: CT CHEST FINDINGS  Mediastinum: Heart size is normal. There is no significant pericardial fluid, thickening or pericardial calcification. No pathologically enlarged mediastinal, bilateral hilar or internal mammary lymph nodes. Esophagus is unremarkable in appearance.  Lungs/Pleura: Trace bilateral pleural effusions layering dependently with some mild dependent subsegmental atelectasis in the lower lobes of the lungs bilaterally. No acute consolidative airspace disease. No suspicious appearing pulmonary nodules or masses.  Musculoskeletal: Postoperative changes in the right breast are noted, with a large open wound filled with packing material. The periphery of the open wound demonstrates a thick rind of soft tissue, some of which appears to enhance. In the medial aspect of the right breast (image 27 of series 2) there is a 1.5 x 1.3 cm enhancing soft tissue lesion, suspicious for a focus of residual tumor or a malignant lymph node. Numerous borderline enlarged and mildly enlarged right axillary lymph nodes are noted, measuring up to 12 mm in short axis. There are no aggressive appearing lytic or blastic lesions noted in the visualized portions of the skeleton.  CT ABDOMEN AND PELVIS FINDINGS  Abdomen/Pelvis: Several well-defined low-attenuation lesions in the liver appear unchanged in size, number and pattern of distribution compared to prior study 2012, presumably small cysts. The largest of these lesions measures 2.1 cm in segment 4A. No other suspicious hepatic lesions are noted. Status post cholecystectomy. The appearance of the pancreas, spleen, bilateral adrenal glands and bilateral kidneys is unremarkable.  Normal appendix (retrocecal in position). No significant volume of ascites. No pneumoperitoneum. No pathologic distention of small bowel. There are a few colonic diverticulae, particularly in the region of the sigmoid colon, without surrounding  inflammatory changes to suggest an acute diverticulitis at this time. Several small ventral hernias are noted, containing several loops of small bowel and a short segment of the mid transverse colon, without evidence of bowel incarceration or obstruction at this time. No lymphadenopathy identified within the abdomen or pelvis. Status post hysterectomy. Ovaries are not confidently identified may be surgically absent or atrophic.  Musculoskeletal: There are no aggressive appearing lytic or blastic lesions noted in the visualized portions of the skeleton. Edema throughout the fat of the right flank, presumably reactive to the process in the right breast.  IMPRESSION: 1. Postoperative changes of recent right breast surgery with packed open wound which has a thick rind of soft tissue, some of which enhances, a 1.3 x 1.5 cm enhancing lesion in the medial aspect of the right breast which may represent residual tumor or a malignant lymph node, and multiple enlarged right axillary nodes concerning for  axillary nodal metastases. 2. No other definite signs of metastatic disease in the chest, abdomen or pelvis. 3. Multiple small ventral hernias containing several loops of small bowel, as well as the mid transverse colon, without associated bowel incarceration or obstruction at this time. 4. Trace bilateral pleural effusions with mild dependent subsegmental atelectasis in the lower lobes of the lungs bilaterally. 5. Status post cholecystectomy and hysterectomy. 6. Additional incidental findings, as above.   Electronically Signed   By: Vinnie Langton M.D.   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 Evaluate for metastatic disease.  EXAM: CT CHEST, ABDOMEN, AND PELVIS WITH CONTRAST  TECHNIQUE: Multidetector CT imaging of the chest, abdomen and pelvis was performed following the standard protocol during bolus administration  of intravenous contrast.  CONTRAST:  16mL OMNIPAQUE IOHEXOL 300 MG/ML  SOLN  COMPARISON:  CT of abdomen pelvis 06/21/2010.  FINDINGS: CT CHEST FINDINGS  Mediastinum: Heart size is normal. There is no significant pericardial fluid, thickening or pericardial calcification. No pathologically enlarged mediastinal, bilateral hilar or internal mammary lymph nodes. Esophagus is unremarkable in appearance.  Lungs/Pleura: Trace bilateral pleural effusions layering dependently with some mild dependent subsegmental atelectasis in the lower lobes of the lungs bilaterally. No acute consolidative airspace disease. No suspicious appearing pulmonary nodules or masses.  Musculoskeletal: Postoperative changes in the right breast are noted, with a large open wound filled with packing material. The periphery of the open wound demonstrates a thick rind of soft tissue, some of which appears to enhance. In the medial aspect of the right breast (image 27 of series 2) there is a 1.5 x 1.3 cm enhancing soft tissue lesion, suspicious for a focus of residual tumor or a malignant lymph node. Numerous borderline enlarged and mildly enlarged right axillary lymph nodes are noted, measuring up to 12 mm in short axis. There are no aggressive appearing lytic or blastic lesions noted in the visualized portions of the skeleton.  CT ABDOMEN AND PELVIS FINDINGS  Abdomen/Pelvis: Several well-defined low-attenuation lesions in the liver appear unchanged in size, number and pattern of distribution compared to prior study 2012, presumably small cysts. The largest of these lesions measures 2.1 cm in segment 4A. No other suspicious hepatic lesions are noted. Status post cholecystectomy. The appearance of the pancreas, spleen, bilateral adrenal glands and bilateral kidneys is unremarkable.  Normal appendix (retrocecal in position). No significant volume of ascites. No pneumoperitoneum. No pathologic distention of small bowel. There are a few colonic  diverticulae, particularly in the region of the sigmoid colon, without surrounding inflammatory changes to suggest an acute diverticulitis at this time. Several small ventral hernias are noted, containing several loops of small bowel and a short segment of the mid transverse colon, without evidence of bowel incarceration or obstruction at this time. No lymphadenopathy identified within the abdomen or pelvis. Status post hysterectomy. Ovaries are not confidently identified may be surgically absent or atrophic.  Musculoskeletal: There are no aggressive appearing lytic or blastic lesions noted in the visualized portions of the skeleton. Edema throughout the fat of the right flank, presumably reactive to the process in the right breast.  IMPRESSION: 1. Postoperative changes of recent right breast surgery with packed open wound which has a thick rind of soft tissue, some of which enhances, a 1.3 x 1.5 cm enhancing lesion in the medial aspect of the right breast which may represent residual tumor or a malignant  lymph node, and multiple enlarged right axillary nodes concerning for axillary nodal metastases. 2. No other definite signs of metastatic disease in the chest, abdomen or pelvis. 3. Multiple small ventral hernias containing several loops of small bowel, as well as the mid transverse colon, without associated bowel incarceration or obstruction at this time. 4. Trace bilateral pleural effusions with mild dependent subsegmental atelectasis in the lower lobes of the lungs bilaterally. 5. Status post cholecystectomy and hysterectomy. 6. Additional incidental findings, as above.   Electronically Signed   By: Vinnie Langton M.D.   On: 10/26/2013 08:35    Microbiology: No results found for this or any previous visit (from the past 240 hour(s)).   Labs: Basic Metabolic Panel:  Recent Labs Lab 11/03/13 0532 11/04/13 0550 11/05/13 0445 11/06/13 0408 11/07/13 0540  NA 141 139 138 138 138  K 4.1 4.3 4.0 4.5  4.2  CL 102 102 102 101 102  CO2 25 26 23 24 27   GLUCOSE 93 98 88 82 76  BUN 15 12 12 10 12   CREATININE 1.98* 1.99* 1.75* 1.49* 1.50*  CALCIUM 8.5 9.0 8.8 8.7 8.8   Liver Function Tests: No results found for this basename: AST, ALT, ALKPHOS, BILITOT, PROT, ALBUMIN,  in the last 168 hours No results found for this basename: LIPASE, AMYLASE,  in the last 168 hours No results found for this basename: AMMONIA,  in the last 168 hours CBC:  Recent Labs Lab 11/01/13 0653 11/02/13 0550 11/03/13 0532 11/04/13 0550 11/05/13 0445 11/07/13 0540  WBC 11.9* 9.1 10.1  --  11.6* 8.6  HGB 7.7* 8.4* 7.8* 8.5* 8.5* 7.8*  HCT 23.7* 25.8* 24.1* 26.6* 25.3* 24.7*  MCV 87.5 89.0 89.3  --  89.1 89.8  PLT 709* 735* 693*  --  747* 592*   Cardiac Enzymes: No results found for this basename: CKTOTAL, CKMB, CKMBINDEX, TROPONINI,  in the last 168 hours BNP: BNP (last 3 results) No results found for this basename: PROBNP,  in the last 8760 hours CBG:  Recent Labs Lab 11/06/13 0802 11/06/13 1117 11/06/13 1717 11/06/13 2151 11/07/13 0815  GLUCAP 77 119* 110* 108* 88       Signed:  Avalon Coppinger  [facility] 11/07/2013, 10:53 AM

## 2013-11-06 NOTE — Progress Notes (Signed)
[facility]                                                                              Patient Demographics  Carmen Stone, is a 58 y.o. female, DOB - 1956-02-15, PTW:[id number]  Admit date - 10/22/2013   Admitting Physician [name], MD  Outpatient Primary MD for the patient is [name], MD  LOS - 15   Chief Complaint  Patient presents with  . Breast Discharge      HPI 57 yo AAF who states she was in her usual state of health until she noticed some right breast swelling and pain. Over the past several days the area had worsened and started draining fluid. She denied any trauma to area. She stated that her right breast appeared the same as the left breast until coming into the ER.  She denied any medical history but when she was told by the surgeon that her chart documents her being a diabetic - she stated she was unaware. She states her last mammogram was 5 yrs ago. She denied any family history of breast cancer or other malignancy. She denied any weight loss, LAD, nipple drainage.    Assessment & Plan  Intractable nausea and vomiting -Improving, Unknown cause at this time ?narcotics -Patient was on a liquid diet, will advance to carb modifed -Continue anti-emetic -Gastroenterology consulted, felt that her pain medications could be contributing to her nausea and vomiting.  -Continue tramadol.  Acute renal failure -FENa 1.3%, likely ATN from vancomycin, bactrim -Creatinine appears to be improving, Cr 1.49 -Will discontinue fluids.  Continue to monitor BMP -Bactrim discontinued -Avoid nephrotoxic agents  Acute respiratory or hypoxia -Resolved -Chest x-ray are unremarkable  Inflammatory breast cancer/breast abscess -Status post radial mastectomy with lymph node dissection -Surgery following, drains in place -CT scan of the metastasis -Bactrim discontinued on 11/02/13 -Continue on Ceftin 500 mg twice a day  Diabetes mellitus type  2 -Continue insulin sliding scale, Lantus, CBG monitoring -Hemoglobin A1c 11.8 (10/22/2013) -Spoke with Diabetes coordinator, will place patient on Lantus 10units daily and Tradjenta -CBGs under good control  Postoperative anemia due to acute blood loss -Baseline hemoglobin appears to be benign -Patient was transfused 2 units of packed red blood cells on 5/12 -Hemoglobin currently 8.5 -Continue to monitor CBC -Patient currently hemodynamically stable  Urinary tract infection -UA on 10/22/2013: few bacteria, 1-20 white blood cells, small leukocytes -Urine culture: 30K colonies, nonspecific -Patient appears patient received adequate amount of antibiotic average  Code Status: Full  Family Communication: None at bedside  Disposition Plan: Admitted, likely discharge 5/25  Time Spent in minutes   20 minutes  Procedures  Right mastectomy  Consults   Surgery Gastroenterology  DVT Prophylaxis  Lovenox   Lab Results  Component Value Date   PLT 747* 11/05/2013    Medications  Scheduled Meds: . cefUROXime  500 mg Oral BID WC  . enoxaparin (LOVENOX) injection  40 mg Subcutaneous Q24H  . insulin aspart  0-20 Units Subcutaneous TID WC  . insulin aspart  0-5 Units Subcutaneous QHS  . insulin glargine  10 Units Subcutaneous Daily  . linagliptin  5 mg Oral Daily  .  pantoprazole  40 mg Oral Q0600  . sodium chloride  3 mL Intravenous Q12H  . traMADol  50 mg Oral 4 times per day   Continuous Infusions: . sodium chloride 75 mL/hr at 11/05/13 0543   PRN Meds:.sodium chloride, acetaminophen, alum & mag hydroxide-simeth, bisacodyl, diphenhydrAMINE, morphine injection, ondansetron (ZOFRAN) IV, oxyCODONE, polyethylene glycol, sodium chloride  Antibiotics    Anti-infectives   Start     Dose/Rate Route Frequency Ordered Stop   11/02/13 1800  cefUROXime (CEFTIN) tablet 500 mg     500 mg Oral 2 times daily with meals 11/02/13 1143     10/31/13 1000  sulfamethoxazole-trimethoprim  (BACTRIM DS) 800-160 MG per tablet 1 tablet  Status:  Discontinued     1 tablet Oral Every 12 hours 10/31/13 0921 11/02/13 1143   10/30/13 1000  vancomycin (VANCOCIN) IVPB 1000 mg/200 mL premix  Status:  Discontinued     1,000 mg 200 mL/hr over 60 Minutes Intravenous Every 24 hours 10/29/13 1102 10/30/13 1316   10/22/13 1215  clindamycin (CLEOCIN) IVPB 600 mg  Status:  Discontinued     600 mg 100 mL/hr over 30 Minutes Intravenous 4 times per day 10/22/13 1136 10/31/13 0921   10/22/13 1215  vancomycin (VANCOCIN) IVPB 1000 mg/200 mL premix  Status:  Discontinued     1,000 mg 200 mL/hr over 60 Minutes Intravenous Every 12 hours 10/22/13 1142 10/29/13 1102   10/22/13 0430  ceFEPIme (MAXIPIME) 1 g in dextrose 5 % 50 mL IVPB     1 g 100 mL/hr over 30 Minutes Intravenous  Once 10/22/13 0423 10/22/13 0552   10/22/13 0430  vancomycin (VANCOCIN) IVPB 1000 mg/200 mL premix  Status:  Discontinued     1,000 mg 200 mL/hr over 60 Minutes Intravenous  Once 10/22/13 0423 10/22/13 1142   10/22/13 0430  metroNIDAZOLE (FLAGYL) IVPB 500 mg  Status:  Discontinued     500 mg 100 mL/hr over 60 Minutes Intravenous  Once 10/22/13 0423 10/22/13 0424   10/22/13 0430  [MAR Hold]  clindamycin (CLEOCIN) IVPB 900 mg     (On MAR Hold since 10/22/13 0615)   900 mg 100 mL/hr over 30 Minutes Intravenous  Once 10/22/13 0424 10/30/13 2103      Subjective:   Carmen Stone seen and examined today.  Patient denies further nausea or vomiting.  She wishes to eat.  She denies chest pain or shortness of breath.   Objective:   Filed Vitals:   11/05/13 0551 11/05/13 1435 11/05/13 2241 11/06/13 0429  BP: 121/62 120/57 119/58 115/97  Pulse: 68 67 68 74  Temp: 98.1 F (36.7 C) 98.4 F (36.9 C) 98.3 F (36.8 C) 98.1 F (36.7 C)  TempSrc: Oral Oral Oral Oral  Resp: 16 18 16 16   Height:      Weight:      SpO2: 97% 97% 95% 98%    Wt Readings from Last 3 Encounters:  10/22/13 74.532 kg (164 lb 5 oz)  10/22/13 74.532  kg (164 lb 5 oz)  10/22/13 74.532 kg (164 lb 5 oz)     Intake/Output Summary (Last 24 hours) at 11/06/13 0734 Last data filed at 11/05/13 2100  Gross per 24 hour  Intake   2515 ml  Output     85 ml  Net   2430 ml    Exam  General: Well developed, well nourished, NAD, appears stated age  HEENT: NCAT, mucous membranes moist.  Neck: Supple, no JVD, no masses  Cardiovascular: S1 S2  auscultated, no rubs, murmurs or gallops. Regular rate and rhythm.  Respiratory: Clear to auscultation bilaterally with equal chest rise  Abdomen: Soft, obese, +hernia, nontender, nondistended, + bowel sounds  Extremities: warm dry without cyanosis clubbing  Neuro: AAOx3, no focal deficits  Skin: Without rashes exudates or nodules  Psych: Normal affect and demeanor with intact judgement and insight  Data Review   Micro Results No results found for this or any previous visit (from the past 240 hour(s)).  Radiology Reports Dg Chest 2 View  10/22/2013   CLINICAL DATA:  Right breast pain and swelling.  Fever.  EXAM: CHEST  2 VIEW  COMPARISON:  06/13/2010  FINDINGS: The right breast is asymmetrically enlarged with extensive subcutaneous gas. This creates increased density over the right chest. There is no evidence of pneumonia in the lateral projection. No cardiomegaly. No edema, effusion, or pneumothorax.  Critical Value/emergent results were called by telephone at the time of interpretation on 10/22/2013 at 4:17 AM to Dr. Kathrynn Humble , who verbally acknowledged these results.  IMPRESSION: Right breast swelling with extensive subcutaneous gas. If no recent intervention, findings concerning for necrotizing infection.   Electronically Signed   By: Jorje Guild M.D.   On: 10/22/2013 04:17   Ct Chest W Contrast  10/26/2013   CLINICAL DATA:  58 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 Evaluate for metastatic disease.  EXAM: CT CHEST, ABDOMEN, AND PELVIS WITH CONTRAST  TECHNIQUE:  Multidetector CT imaging of the chest, abdomen and pelvis was performed following the standard protocol during bolus administration of intravenous contrast.  CONTRAST:  12mL OMNIPAQUE IOHEXOL 300 MG/ML  SOLN  COMPARISON:  CT of abdomen pelvis 06/21/2010.  FINDINGS: CT CHEST FINDINGS  Mediastinum: Heart size is normal. There is no significant pericardial fluid, thickening or pericardial calcification. No pathologically enlarged mediastinal, bilateral hilar or internal mammary lymph nodes. Esophagus is unremarkable in appearance.  Lungs/Pleura: Trace bilateral pleural effusions layering dependently with some mild dependent subsegmental atelectasis in the lower lobes of the lungs bilaterally. No acute consolidative airspace disease. No suspicious appearing pulmonary nodules or masses.  Musculoskeletal: Postoperative changes in the right breast are noted, with a large open wound filled with packing material. The periphery of the open wound demonstrates a thick rind of soft tissue, some of which appears to enhance. In the medial aspect of the right breast (image 27 of series 2) there is a 1.5 x 1.3 cm enhancing soft tissue lesion, suspicious for a focus of residual tumor or a malignant lymph node. Numerous borderline enlarged and mildly enlarged right axillary lymph nodes are noted, measuring up to 12 mm in short axis. There are no aggressive appearing lytic or blastic lesions noted in the visualized portions of the skeleton.  CT ABDOMEN AND PELVIS FINDINGS  Abdomen/Pelvis: Several well-defined low-attenuation lesions in the liver appear unchanged in size, number and pattern of distribution compared to prior study 2012, presumably small cysts. The largest of these lesions measures 2.1 cm in segment 4A. No other suspicious hepatic lesions are noted. Status post cholecystectomy. The appearance of the pancreas, spleen, bilateral adrenal glands and bilateral kidneys is unremarkable.  Normal appendix (retrocecal in  position). No significant volume of ascites. No pneumoperitoneum. No pathologic distention of small bowel. There are a few colonic diverticulae, particularly in the region of the sigmoid colon, without surrounding inflammatory changes to suggest an acute diverticulitis at this time. Several small ventral hernias are noted, containing several loops of small bowel and a short segment of the  mid transverse colon, without evidence of bowel incarceration or obstruction at this time. No lymphadenopathy identified within the abdomen or pelvis. Status post hysterectomy. Ovaries are not confidently identified may be surgically absent or atrophic.  Musculoskeletal: There are no aggressive appearing lytic or blastic lesions noted in the visualized portions of the skeleton. Edema throughout the fat of the right flank, presumably reactive to the process in the right breast.  IMPRESSION: 1. Postoperative changes of recent right breast surgery with packed open wound which has a thick rind of soft tissue, some of which enhances, a 1.3 x 1.5 cm enhancing lesion in the medial aspect of the right breast which may represent residual tumor or a malignant lymph node, and multiple enlarged right axillary nodes concerning for axillary nodal metastases. 2. No other definite signs of metastatic disease in the chest, abdomen or pelvis. 3. Multiple small ventral hernias containing several loops of small bowel, as well as the mid transverse colon, without associated bowel incarceration or obstruction at this time. 4. Trace bilateral pleural effusions with mild dependent subsegmental atelectasis in the lower lobes of the lungs bilaterally. 5. Status post cholecystectomy and hysterectomy. 6. Additional incidental findings, as above.   Electronically Signed   By: Vinnie Langton M.D.   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 Evaluate for  metastatic disease.  EXAM: CT CHEST, ABDOMEN, AND PELVIS WITH CONTRAST  TECHNIQUE: Multidetector CT imaging of the chest, abdomen and pelvis was performed following the standard protocol during bolus administration of intravenous contrast.  CONTRAST:  174mL OMNIPAQUE IOHEXOL 300 MG/ML  SOLN  COMPARISON:  CT of abdomen pelvis 06/21/2010.  FINDINGS: CT CHEST FINDINGS  Mediastinum: Heart size is normal. There is no significant pericardial fluid, thickening or pericardial calcification. No pathologically enlarged mediastinal, bilateral hilar or internal mammary lymph nodes. Esophagus is unremarkable in appearance.  Lungs/Pleura: Trace bilateral pleural effusions layering dependently with some mild dependent subsegmental atelectasis in the lower lobes of the lungs bilaterally. No acute consolidative airspace disease. No suspicious appearing pulmonary nodules or masses.  Musculoskeletal: Postoperative changes in the right breast are noted, with a large open wound filled with packing material. The periphery of the open wound demonstrates a thick rind of soft tissue, some of which appears to enhance. In the medial aspect of the right breast (image 27 of series 2) there is a 1.5 x 1.3 cm enhancing soft tissue lesion, suspicious for a focus of residual tumor or a malignant lymph node. Numerous borderline enlarged and mildly enlarged right axillary lymph nodes are noted, measuring up to 12 mm in short axis. There are no aggressive appearing lytic or blastic lesions noted in the visualized portions of the skeleton.  CT ABDOMEN AND PELVIS FINDINGS  Abdomen/Pelvis: Several well-defined low-attenuation lesions in the liver appear unchanged in size, number and pattern of distribution compared to prior study 2012, presumably small cysts. The largest of these lesions measures 2.1 cm in segment 4A. No other suspicious hepatic lesions are noted. Status post cholecystectomy. The appearance of the pancreas, spleen, bilateral adrenal  glands and bilateral kidneys is unremarkable.  Normal appendix (retrocecal in position). No significant volume of ascites. No pneumoperitoneum. No pathologic distention of small bowel. There are a few colonic diverticulae, particularly in the region of the sigmoid colon, without surrounding inflammatory changes to suggest an acute diverticulitis at this time. Several small ventral hernias are noted, containing several  loops of small bowel and a short segment of the mid transverse colon, without evidence of bowel incarceration or obstruction at this time. No lymphadenopathy identified within the abdomen or pelvis. Status post hysterectomy. Ovaries are not confidently identified may be surgically absent or atrophic.  Musculoskeletal: There are no aggressive appearing lytic or blastic lesions noted in the visualized portions of the skeleton. Edema throughout the fat of the right flank, presumably reactive to the process in the right breast.  IMPRESSION: 1. Postoperative changes of recent right breast surgery with packed open wound which has a thick rind of soft tissue, some of which enhances, a 1.3 x 1.5 cm enhancing lesion in the medial aspect of the right breast which may represent residual tumor or a malignant lymph node, and multiple enlarged right axillary nodes concerning for axillary nodal metastases. 2. No other definite signs of metastatic disease in the chest, abdomen or pelvis. 3. Multiple small ventral hernias containing several loops of small bowel, as well as the mid transverse colon, without associated bowel incarceration or obstruction at this time. 4. Trace bilateral pleural effusions with mild dependent subsegmental atelectasis in the lower lobes of the lungs bilaterally. 5. Status post cholecystectomy and hysterectomy. 6. Additional incidental findings, as above.   Electronically Signed   By: Vinnie Langton M.D.   On: 10/26/2013 08:35    CBC  Recent Labs Lab 10/31/13 0505 11/01/13 0653  11/02/13 0550 11/03/13 0532 11/04/13 0550 11/05/13 0445  WBC 13.4* 11.9* 9.1 10.1  --  11.6*  HGB 7.8* 7.7* 8.4* 7.8* 8.5* 8.5*  HCT 24.0* 23.7* 25.8* 24.1* 26.6* 25.3*  PLT 725* 709* 735* 693*  --  747*  MCV 88.2 87.5 89.0 89.3  --  89.1  MCH 28.7 28.4 29.0 28.9  --  29.9  MCHC 32.5 32.5 32.6 32.4  --  33.6  RDW 14.4 14.3 14.5 14.5  --  13.9    Chemistries   Recent Labs Lab 10/30/13 1014  11/02/13 0550 11/03/13 0532 11/04/13 0550 11/05/13 0445 11/06/13 0408  NA 138  138  < > 139 141 139 138 138  K 4.0  4.2  < > 4.6 4.1 4.3 4.0 4.5  CL 99  99  < > 100 102 102 102 101  CO2 26  23  < > 28 25 26 23 24   GLUCOSE 178*  175*  < > 95 93 98 88 82  BUN 15  15  < > 19 15 12 12 10   CREATININE 1.67*  1.72*  < > 2.05* 1.98* 1.99* 1.75* 1.49*  CALCIUM 8.7  8.7  < > 9.0 8.5 9.0 8.8 8.7  AST 20  --   --   --   --   --   --   ALT 6  --   --   --   --   --   --   ALKPHOS 70  --   --   --   --   --   --   BILITOT <0.2*  --   --   --   --   --   --   < > = values in this interval not displayed. ------------------------------------------------------------------------------------------------------------------ estimated creatinine clearance is 40.2 ml/min (by C-G formula based on Cr of 1.49). ------------------------------------------------------------------------------------------------------------------ No results found for this basename: HGBA1C,  in the last 72 hours ------------------------------------------------------------------------------------------------------------------ No results found for this basename: CHOL, HDL, LDLCALC, TRIG, CHOLHDL, LDLDIRECT,  in the last 72 hours ------------------------------------------------------------------------------------------------------------------ No results found for  this basename: TSH, T4TOTAL, FREET3, T3FREE, THYROIDAB,  in the last 72  hours ------------------------------------------------------------------------------------------------------------------ No results found for this basename: VITAMINB12, FOLATE, FERRITIN, TIBC, IRON, RETICCTPCT,  in the last 72 hours  Coagulation profile No results found for this basename: INR, PROTIME,  in the last 168 hours  No results found for this basename: DDIMER,  in the last 72 hours  Cardiac Enzymes No results found for this basename: CK, CKMB, TROPONINI, MYOGLOBIN,  in the last 168 hours ------------------------------------------------------------------------------------------------------------------ No components found with this basename: POCBNP,     Greydis Stlouis D.O. on 11/06/2013 at 7:34 AM  Between 7am to 7pm - Pager - [phone]  After 7pm go to [email or web address] - password [email or web address]  And look for the night coverage person covering for me after hours  [facility] Office  [phone]

## 2013-11-06 NOTE — Progress Notes (Signed)
11 Days Post-Op  Subjective: Patient is feeling well without complaints about her mastectomy wound or arm. She states that the [facility] told her she could stay another day.  Objective: Vital signs in last 24 hours: Temp:  [98.1 F (36.7 C)-98.4 F (36.9 C)] 98.1 F (36.7 C) (05/24 0429) Pulse Rate:  [67-74] 74 (05/24 0429) Resp:  [16-18] 16 (05/24 0429) BP: (115-120)/(57-97) 115/97 mmHg (05/24 0429) SpO2:  [95 %-98 %] 98 % (05/24 0429) Last BM Date: 11/05/13  Intake/Output from previous day: 05/23 0701 - 05/24 0700 In: 2515 [P.O.:740; I.V.:1775] Out: 85 [Drains:85] Intake/Output this shift:    General appearance: alert. No distress. Mental status normal Breasts:  right mastectomy wound clean and dry. Some thickening of the tissues which may be simply due to the inflammatory component of her cancer. Drains functioning, drainage stand.  Lab Results:  Results for orders placed during the hospital encounter of 10/22/13 (from the past 24 hour(s))  GLUCOSE, CAPILLARY     Status: Abnormal   Collection Time    11/05/13 11:42 AM      Result Value Ref Range   Glucose-Capillary 111 (*) 70 - 99 mg/dL   Comment 1 Notify RN    GLUCOSE, CAPILLARY     Status: Abnormal   Collection Time    11/05/13  5:17 PM      Result Value Ref Range   Glucose-Capillary 100 (*) 70 - 99 mg/dL   Comment 1 Notify RN    GLUCOSE, CAPILLARY     Status: Abnormal   Collection Time    11/05/13 10:41 PM      Result Value Ref Range   Glucose-Capillary 134 (*) 70 - 99 mg/dL   Comment 1 Notify RN    BASIC METABOLIC PANEL     Status: Abnormal   Collection Time    11/06/13  4:08 AM      Result Value Ref Range   Sodium 138  137 - 147 mEq/L   Potassium 4.5  3.7 - 5.3 mEq/L   Chloride 101  96 - 112 mEq/L   CO2 24  19 - 32 mEq/L   Glucose, Bld 82  70 - 99 mg/dL   BUN 10  6 - 23 mg/dL   Creatinine, Ser 1.49 (*) 0.50 - 1.10 mg/dL   Calcium 8.7  8.4 - 10.5 mg/dL   GFR calc non Af Amer 37 (*) >90 mL/min    GFR calc Af Amer 43 (*) >90 mL/min     Studies/Results: No results found.  . cefUROXime  500 mg Oral BID WC  . enoxaparin (LOVENOX) injection  40 mg Subcutaneous Q24H  . insulin aspart  0-20 Units Subcutaneous TID WC  . insulin aspart  0-5 Units Subcutaneous QHS  . insulin glargine  10 Units Subcutaneous Daily  . linagliptin  5 mg Oral Daily  . pantoprazole  40 mg Oral Q0600  . sodium chloride  3 mL Intravenous Q12H  . traMADol  50 mg Oral 4 times per day     Assessment/Plan: s/p Procedure(s): COMPLETION MASTECTOMY, AXILLARY DISSECTION  POD#11. Completion right mastectomy and axillary dissection. Locally advanced inflammatory breast cancer right breast. Positive margins.  From a surgical standpoint, she is ready for discharge home with drains in place and home health nursing. Our office will call her in Tuesday to arrange for followup office visit with Dr. [name], and we will also arrange for outpatient Port-A-Cath insertion.  Defer comprehensive discharge planning incisions to [facility].  Acute  renal failure Intractable nausea and vomiting Type 2 diabetes Acute blood loss anemia, postoperative Urinary tract infection    @PROBHOSP @  LOS: 15 days    Ruben Mahler M Jadzia Ibsen 11/06/2013  . .prob

## 2013-11-07 LAB — BASIC METABOLIC PANEL
BUN: 12 mg/dL (ref 6–23)
CALCIUM: 8.8 mg/dL (ref 8.4–10.5)
CO2: 27 mEq/L (ref 19–32)
Chloride: 102 mEq/L (ref 96–112)
Creatinine, Ser: 1.5 mg/dL — ABNORMAL HIGH (ref 0.50–1.10)
GFR, EST AFRICAN AMERICAN: 43 mL/min — AB (ref >90)
GFR, EST NON AFRICAN AMERICAN: 37 mL/min — AB (ref >90)
Glucose, Bld: 76 mg/dL (ref 70–99)
POTASSIUM: 4.2 meq/L (ref 3.7–5.3)
SODIUM: 138 meq/L (ref 137–147)

## 2013-11-07 LAB — CBC
HCT: 24.7 % — ABNORMAL LOW (ref 36.0–46.0)
HEMOGLOBIN: 7.8 g/dL — AB (ref 12.0–15.0)
MCH: 28.4 pg (ref 26.0–34.0)
MCHC: 31.6 g/dL (ref 30.0–36.0)
MCV: 89.8 fL (ref 78.0–100.0)
PLATELETS: 592 10*3/uL — AB (ref 150–400)
RBC: 2.75 MIL/uL — ABNORMAL LOW (ref 3.87–5.11)
RDW: 14.1 % (ref 11.5–15.5)
WBC: 8.6 10*3/uL (ref 4.0–10.5)

## 2013-11-07 LAB — GLUCOSE, CAPILLARY
Glucose-Capillary: 88 mg/dL (ref 70–99)
Glucose-Capillary: 119 mg/dL — ABNORMAL HIGH (ref 70–99)

## 2013-11-07 MED ORDER — GLUCOSE BLOOD VI STRP: ORAL_STRIP | Status: AC

## 2013-11-07 MED ORDER — TRAMADOL HCL 50 MG PO TABS: 50.0000 mg | ORAL_TABLET | Freq: Four times a day (QID) | ORAL | Status: DC

## 2013-11-07 MED ORDER — FREESTYLE SYSTEM KIT: 1.0000 | PACK | Status: AC | PRN

## 2013-11-07 MED ORDER — LINAGLIPTIN 5 MG PO TABS: 5.0000 mg | ORAL_TABLET | Freq: Every day | ORAL | Status: DC

## 2013-11-07 MED ORDER — CEFUROXIME AXETIL 500 MG PO TABS: 500.0000 mg | ORAL_TABLET | Freq: Two times a day (BID) | ORAL | Status: AC

## 2013-11-07 MED ORDER — OXYCODONE HCL 5 MG PO TABS: 5.0000 mg | ORAL_TABLET | ORAL | Status: DC | PRN

## 2013-11-07 MED ORDER — PANTOPRAZOLE SODIUM 40 MG PO TBEC: 40.0000 mg | DELAYED_RELEASE_TABLET | Freq: Every day | ORAL | Status: DC

## 2013-11-07 MED ORDER — INSULIN GLARGINE 100 UNIT/ML SOLOSTAR PEN: 10.0000 [IU] | PEN_INJECTOR | Freq: Every day | SUBCUTANEOUS | Status: DC

## 2013-11-07 NOTE — Discharge Instructions (Signed)
Bulb Drain Home Care A bulb drain consists of a thin rubber tube and a soft, round bulb that creates a gentle suction. The rubber tube is placed in the area where you had surgery. A bulb is attached to the end of the tube that is outside the body. The bulb drain removes excess fluid that normally builds up in a surgical wound after surgery. The color and amount of fluid will vary. Immediately after surgery, the fluid is bright red and is a little thicker than water. It may gradually change to a yellow or pink color and become more thin and water-like. When the amount decreases to about 1 or 2 tbsp in 24 hours, your health care provider will usually remove it. DAILY CARE  Keep the bulb flat (compressed) at all times, except while emptying it. The flatness creates suction. You can flatten the bulb by squeezing it firmly in the middle and then closing the cap.  Keep sites where the tube enters the skin dry and covered with a bandage (dressing).  Secure the tube 1 2 in (2.5 5.1 cm) below the insertion sites, to keep it from pulling on your stitches. The tube is stitched in place and will not slip out.  Secure the bulb as directed by your health care provider.  For the first 3 days after surgery, there usually is more fluid in the bulb. Empty the bulb whenever it becomes half full because the bulb does not create enough suction if it is too full. The bulb could also overflow. Write down how much fluid you remove each time you empty your drain. Add up the amount removed in 24 hours.  Empty the bulb at the same time every day once the amount of fluid decreases and you only need to empty it once a day. Write down the amounts and the 24-hour totals to give to your health care provider. This helps your health care provider know when the tubes can be removed. EMPTYING THE BULB DRAIN Before emptying the bulb, get a measuring cup, a piece of paper, and a pen and wash your hands.  Gently run your fingers down  the tube (stripping) to empty any drainage from the tubing into the bulb. This may need to be done several times a day to clear the tubing of clots and tissue.  Open the bulb cap to release suction, which causes it to inflate. Do not touch the inside of the cap.  Gently run your fingers down the tube (stripping) to empty any drainage from the tubing into the bulb.  Hold the cap out of the way, and pour fluid into the measuring cup.   Squeeze the bulb to provide suction.  Replace the cap.   Check the tape that holds the tube to your skin. If it is becoming loose, you can remove the loose piece of tape and apply a new one. Then, pin the bulb to your shirt.   Write down the amount of fluid you emptied out. Write down the date and each time you emptied your bulb drain. (If there are 2 bulbs, note the amount of drainage from each bulb and keep the totals separate. Your health care provider will want to know the total amounts for each drain and which tube is draining more.)   Flush the fluid down the toilet and wash your hands.   Call your health care provider once you have less than 2 tbsp of fluid collecting in the bulb drain every 24  hours. If there is drainage around the tube site, change dressings and keep the area dry. Cleanse around tube with sterile saline and place dry gauze around site. This gauze should be changed when it is soiled. If it stays clean and unsoiled, it should still be changed daily.  SEEK MEDICAL CARE IF:  Your drainage has a bad smell or is cloudy.   You have a fever.   Your drainage is increasing instead of decreasing.   Your tube fell out.   You have redness or swelling around the tube site.   You have drainage from a surgical wound.   Your bulb drain will not stay flat after you empty it.  MAKE SURE YOU:   Understand these instructions.  Will watch your condition.  Will get help right away if you are not doing well or get worse. Document  Released: 05/30/2000 Document Revised: 03/23/2013 Document Reviewed: 11/05/2011 [facility] Patient Information 2014 [place], [place].

## 2013-11-07 NOTE — Progress Notes (Signed)
General Surgery Note  LOS: 16 days  POD -  12 Days Post-Op  Assessment/Plan: 1.  COMPLETION Right MASTECTOMY, AXILLARY DISSECTION - - 10/26/2013 - [name]  Dr. [name] (10/24/13) - Right total mastectomy   Dr. Redmond Pulling (10/22/13) - I&D and debridement of right breast wound with breast biopsy   For right inflammatory breast cancer   On Ceftin  Medial drain removed.  1/2 sutures removed.  She need to call our office tomorrow for follow up with Dr. [name].  2.  Acute renal failure  Creat - 1.5 - 11/07/2013 3.  Diabetes mellitus 4.  Anemia  Hgb - 7.8 - 11/08/2103 5.  DVT prophylaxis - Lovenox 6.  Isolation for MRSA   Principal Problem:   Inflammatory breast cancer Active Problems:   DIABETES MELLITUS, TYPE II   Breast abscess of female   Postoperative anemia due to acute blood loss   Acute respiratory failure with hypoxia   Abnormal urinalysis   ATN (acute tubular necrosis)   Acute respiratory failure   Nausea with vomiting  Subjective:  Anticipating discharge.  Looks good. Objective:   Filed Vitals:   11/07/13 0851  BP: 140/56  Pulse: 71  Temp: 98.8 F (37.1 C)  Resp: 16     Intake/Output from previous day:  05/24 0701 - 05/25 0700 In: 600 [P.O.:600] Out: 85 [Drains:85]  Intake/Output this shift:  Total I/O In: 360 [P.O.:360] Out: -    Physical Exam:   General: WN older AA F who is alert and oriented.    HEENT: Normal. Pupils equal. .   Lungs: Clear   Wound: Looks good.  Will remove 1/2 sutures.  Drains - #1/2 - 15/70 cc    Lab Results:     Recent Labs  11/05/13 0445 11/07/13 0540  WBC 11.6* 8.6  HGB 8.5* 7.8*  HCT 25.3* 24.7*  PLT 747* 592*    BMET    Recent Labs  11/06/13 0408 11/07/13 0540  NA 138 138  K 4.5 4.2  CL 101 102  CO2 24 27  GLUCOSE 82 76  BUN 10 12  CREATININE 1.49* 1.50*  CALCIUM 8.7 8.8    PT/INR  No results found for this basename: LABPROT, INR,  in the last 72 hours  ABG  No results found for this basename:  PHART, PCO2, PO2, HCO3,  in the last 72 hours   Studies/Results:  No results found.   Anti-infectives:   Anti-infectives   Start     Dose/Rate Route Frequency Ordered Stop   11/07/13 0000  cefUROXime (CEFTIN) 500 MG tablet     500 mg Oral 2 times daily with meals 11/07/13 0841 11/16/13 2359   11/02/13 1800  cefUROXime (CEFTIN) tablet 500 mg     500 mg Oral 2 times daily with meals 11/02/13 1143     10/31/13 1000  sulfamethoxazole-trimethoprim (BACTRIM DS) 800-160 MG per tablet 1 tablet  Status:  Discontinued     1 tablet Oral Every 12 hours 10/31/13 0921 11/02/13 1143   10/30/13 1000  vancomycin (VANCOCIN) IVPB 1000 mg/200 mL premix  Status:  Discontinued     1,000 mg 200 mL/hr over 60 Minutes Intravenous Every 24 hours 10/29/13 1102 10/30/13 1316   10/22/13 1215  clindamycin (CLEOCIN) IVPB 600 mg  Status:  Discontinued     600 mg 100 mL/hr over 30 Minutes Intravenous 4 times per day 10/22/13 1136 10/31/13 0921   10/22/13 1215  vancomycin (VANCOCIN) IVPB 1000 mg/200 mL premix  Status:  Discontinued     1,000 mg 200 mL/hr over 60 Minutes Intravenous Every 12 hours 10/22/13 1142 10/29/13 1102   10/22/13 0430  ceFEPIme (MAXIPIME) 1 g in dextrose 5 % 50 mL IVPB     1 g 100 mL/hr over 30 Minutes Intravenous  Once 10/22/13 0423 10/22/13 0552   10/22/13 0430  vancomycin (VANCOCIN) IVPB 1000 mg/200 mL premix  Status:  Discontinued     1,000 mg 200 mL/hr over 60 Minutes Intravenous  Once 10/22/13 0423 10/22/13 1142   10/22/13 0430  metroNIDAZOLE (FLAGYL) IVPB 500 mg  Status:  Discontinued     500 mg 100 mL/hr over 60 Minutes Intravenous  Once 10/22/13 0423 10/22/13 0424   10/22/13 0430  [MAR Hold]  clindamycin (CLEOCIN) IVPB 900 mg     (On MAR Hold since 10/22/13 0615)   900 mg 100 mL/hr over 30 Minutes Intravenous  Once 10/22/13 0424 10/30/13 2103      Alphonsa Overall, MD, FACS Pager: [facility] Office: [phone] 11/07/2013

## 2013-11-07 NOTE — Progress Notes (Signed)
Discharge instructions reviewed with patient, including care of JP drain and administration of insulin. Return demonstration completed and directions reviewed.

## 2013-11-07 NOTE — Progress Notes (Signed)
Patient's brother at bedside. Again review of discharge instructions, including f/u appointments, documentation of JP output, and new medications. Printed AVS, prescriptions, and JP care sheet, and dressing supplies given to patient. Questions from brother answered. Patient discharged to home accompanied by brother and sister-n-law.

## 2013-11-08 ENCOUNTER — Telehealth (INDEPENDENT_AMBULATORY_CARE_PROVIDER_SITE_OTHER): Payer: Self-pay

## 2013-11-08 NOTE — Telephone Encounter (Signed)
LMOM for pt giving her my name and the f/u appt for this week with Dr [name]. I made her an appt to see Dr [name] for 11/11/13 arrive at 11:00/11:30. I asked for the pt to call back to confirm the appt.

## 2013-11-09 ENCOUNTER — Telehealth: Payer: Self-pay | Admitting: Oncology

## 2013-11-09 NOTE — Telephone Encounter (Signed)
added for AM. per 5.22 pof

## 2013-11-09 NOTE — Telephone Encounter (Signed)
, °

## 2013-11-11 ENCOUNTER — Ambulatory Visit (INDEPENDENT_AMBULATORY_CARE_PROVIDER_SITE_OTHER): Payer: BC Managed Care – PPO | Admitting: General Surgery

## 2013-11-11 ENCOUNTER — Encounter (INDEPENDENT_AMBULATORY_CARE_PROVIDER_SITE_OTHER): Payer: Self-pay | Admitting: General Surgery

## 2013-11-11 ENCOUNTER — Encounter (INDEPENDENT_AMBULATORY_CARE_PROVIDER_SITE_OTHER): Payer: Self-pay

## 2013-11-11 VITALS — BP 136/68 | HR 84 | Temp 97.1°F | Resp 18 | Ht 64.25 in | Wt 161.6 lb

## 2013-11-11 DIAGNOSIS — C50919 Malignant neoplasm of unspecified site of unspecified female breast: Secondary | ICD-10-CM

## 2013-11-11 NOTE — Progress Notes (Signed)
Subjective:     Patient ID: [name], female   DOB: 01-Aug-1955, 58 y.o.   MRN: [id number]  HPI This is a 58 year old female who presented to the hospital and was seen by one of my partners for what appeared to be a necrotizing soft tissue infection of the right breast. He debrided this in the operating room. There was air coming out of this when he did this. She was still able in this looked to be infected with passage of air out of this wound and I returned to the operating room. I essentially ended up doing a mastectomy but this was just due to fact that the tissue was all dead and necrotic. Her pathology came back as inflammatory breast cancer. I then she returned to the operating room and  I did do an axillary lymph node dissection and closed the entire cavity. She has remained on antibiotics. She had some of her stitches in a drain removed last week. She returns today to discuss port placement. She is also due to see oncology next week.  Review of Systems     Objective:   Physical Exam Right mastectomy wound healed without infection, stitches in place, drain with no output present    Assessment:     Inflammatory breast cancer     Plan:     I think her infection has been cleared. I will leave her stitches in. We'll plan on taking these out in 2 more weeks. I did remove her drain today. She is going to remain on antibiotics until complete. I discussed with her port placement and the risks and benefits associated with that today. I will plan on doing this next week. She is due to see medical oncology next week also.

## 2013-11-14 ENCOUNTER — Telehealth (INDEPENDENT_AMBULATORY_CARE_PROVIDER_SITE_OTHER): Payer: Self-pay

## 2013-11-14 NOTE — Telephone Encounter (Signed)
Sounds fine 

## 2013-11-14 NOTE — Telephone Encounter (Signed)
B[facility] would like to get a order to have the pt to see a Education officer, museum. Verbal order was given to [employer] for a Education officer, museum. Will inform Dr [name]. Beth verbalized understanding and agrees with POC.

## 2013-11-15 ENCOUNTER — Telehealth (INDEPENDENT_AMBULATORY_CARE_PROVIDER_SITE_OTHER): Payer: Self-pay | Admitting: General Surgery

## 2013-11-15 ENCOUNTER — Encounter (HOSPITAL_BASED_OUTPATIENT_CLINIC_OR_DEPARTMENT_OTHER): Payer: Self-pay | Admitting: *Deleted

## 2013-11-15 NOTE — Telephone Encounter (Signed)
Carmen Stone from [facility] called to let us know that she has been trying to contact this patient since 11/11/13 in regards to her surgery to discuss things and confirm everything.  The patient has been unavailable and has not returned any calls.  The pt's voicemail is now full so it is unconfirmed if the patient will be present for the surgery.

## 2013-11-15 NOTE — Progress Notes (Signed)
Have left messages for pt to call x3 days-just called me back Arrive 2 hr early for labs

## 2013-11-15 NOTE — Telephone Encounter (Signed)
Carmen Stone /CDay states she did speak to patient and is aware of Lab to be drawn before surgery.

## 2013-11-16 ENCOUNTER — Encounter (HOSPITAL_BASED_OUTPATIENT_CLINIC_OR_DEPARTMENT_OTHER): Payer: Self-pay | Admitting: *Deleted

## 2013-11-16 ENCOUNTER — Ambulatory Visit (HOSPITAL_BASED_OUTPATIENT_CLINIC_OR_DEPARTMENT_OTHER)
Admission: RE | Admit: 2013-11-16 | Discharge: 2013-11-16 | Disposition: A | Discharge status: Home / Self Care | Payer: BC Managed Care – PPO | Source: Ambulatory Visit | Attending: General Surgery | Admitting: General Surgery

## 2013-11-16 ENCOUNTER — Ambulatory Visit (HOSPITAL_COMMUNITY): Payer: BC Managed Care – PPO

## 2013-11-16 ENCOUNTER — Encounter (HOSPITAL_BASED_OUTPATIENT_CLINIC_OR_DEPARTMENT_OTHER)
Admission: RE | Disposition: A | Discharge status: Home / Self Care | Payer: Self-pay | Source: Ambulatory Visit | Attending: General Surgery

## 2013-11-16 ENCOUNTER — Ambulatory Visit (HOSPITAL_BASED_OUTPATIENT_CLINIC_OR_DEPARTMENT_OTHER): Payer: BC Managed Care – PPO | Admitting: Anesthesiology

## 2013-11-16 ENCOUNTER — Encounter (HOSPITAL_BASED_OUTPATIENT_CLINIC_OR_DEPARTMENT_OTHER): Payer: BC Managed Care – PPO | Admitting: Anesthesiology

## 2013-11-16 DIAGNOSIS — D649 Anemia, unspecified: Secondary | ICD-10-CM | POA: Insufficient documentation

## 2013-11-16 DIAGNOSIS — Z794 Long term (current) use of insulin: Secondary | ICD-10-CM | POA: Insufficient documentation

## 2013-11-16 DIAGNOSIS — Z9010 Acquired absence of unspecified breast and nipple: Secondary | ICD-10-CM | POA: Insufficient documentation

## 2013-11-16 DIAGNOSIS — C50919 Malignant neoplasm of unspecified site of unspecified female breast: Secondary | ICD-10-CM

## 2013-11-16 DIAGNOSIS — E119 Type 2 diabetes mellitus without complications: Secondary | ICD-10-CM | POA: Insufficient documentation

## 2013-11-16 DIAGNOSIS — N289 Disorder of kidney and ureter, unspecified: Secondary | ICD-10-CM | POA: Insufficient documentation

## 2013-11-16 HISTORY — PX: PORTACATH PLACEMENT: SHX2246

## 2013-11-16 LAB — CBC WITH DIFFERENTIAL/PLATELET
BASOS ABS: 0.1 10*3/uL (ref 0.0–0.1)
BASOS PCT: 1 % (ref 0–1)
EOS ABS: 0.6 10*3/uL (ref 0.0–0.7)
Eosinophils Relative: 6 % — ABNORMAL HIGH (ref 0–5)
HEMATOCRIT: 27.5 % — AB (ref 36.0–46.0)
Hemoglobin: 8.8 g/dL — ABNORMAL LOW (ref 12.0–15.0)
Lymphocytes Relative: 15 % (ref 12–46)
Lymphs Abs: 1.6 10*3/uL (ref 0.7–4.0)
MCH: 28.9 pg (ref 26.0–34.0)
MCHC: 32 g/dL (ref 30.0–36.0)
MCV: 90.2 fL (ref 78.0–100.0)
MONO ABS: 0.7 10*3/uL (ref 0.1–1.0)
Monocytes Relative: 7 % (ref 3–12)
Neutro Abs: 7.3 10*3/uL (ref 1.7–7.7)
Neutrophils Relative %: 71 % (ref 43–77)
Platelets: 348 10*3/uL (ref 150–400)
RBC: 3.05 MIL/uL — ABNORMAL LOW (ref 3.87–5.11)
RDW: 14.5 % (ref 11.5–15.5)
WBC: 10.3 10*3/uL (ref 4.0–10.5)

## 2013-11-16 LAB — POCT I-STAT, CHEM 8
BUN: 25 mg/dL — ABNORMAL HIGH (ref 6–23)
Calcium, Ion: 1.15 mmol/L (ref 1.12–1.23)
Chloride: 106 mEq/L (ref 96–112)
Creatinine, Ser: 1.2 mg/dL — ABNORMAL HIGH (ref 0.50–1.10)
Glucose, Bld: 149 mg/dL — ABNORMAL HIGH (ref 70–99)
HEMATOCRIT: 29 % — AB (ref 36.0–46.0)
Hemoglobin: 9.9 g/dL — ABNORMAL LOW (ref 12.0–15.0)
Potassium: 4.1 mEq/L (ref 3.7–5.3)
Sodium: 142 mEq/L (ref 137–147)
TCO2: 26 mmol/L (ref 0–100)

## 2013-11-16 LAB — GLUCOSE, CAPILLARY: GLUCOSE-CAPILLARY: 114 mg/dL — AB (ref 70–99)

## 2013-11-16 SURGERY — INSERTION, TUNNELED CENTRAL VENOUS DEVICE, WITH PORT
Anesthesia: General | Site: Chest

## 2013-11-16 MED ORDER — HEPARIN (PORCINE) IN NACL 2-0.9 UNIT/ML-% IJ SOLN
INTRAMUSCULAR | Status: AC
  Filled 2013-11-16: qty 500

## 2013-11-16 MED ORDER — DEXAMETHASONE SODIUM PHOSPHATE 4 MG/ML IJ SOLN
INTRAMUSCULAR | Status: DC | PRN
  Administered 2013-11-16: 4 mg via INTRAVENOUS

## 2013-11-16 MED ORDER — CEFAZOLIN SODIUM-DEXTROSE 2-3 GM-% IV SOLR
2.0000 g | INTRAVENOUS | Status: AC
  Administered 2013-11-16: 2 g via INTRAVENOUS

## 2013-11-16 MED ORDER — BUPIVACAINE HCL (PF) 0.25 % IJ SOLN
INTRAMUSCULAR | Status: AC
  Filled 2013-11-16: qty 30

## 2013-11-16 MED ORDER — HEPARIN (PORCINE) IN NACL 2-0.9 UNIT/ML-% IJ SOLN
INTRAMUSCULAR | Status: DC | PRN
  Administered 2013-11-16: 500 mL via INTRAVENOUS

## 2013-11-16 MED ORDER — LACTATED RINGERS IV SOLN
INTRAVENOUS | Status: DC
  Administered 2013-11-16 (×2): via INTRAVENOUS

## 2013-11-16 MED ORDER — CEFAZOLIN SODIUM-DEXTROSE 2-3 GM-% IV SOLR
INTRAVENOUS | Status: AC
  Filled 2013-11-16: qty 50

## 2013-11-16 MED ORDER — LIDOCAINE HCL (CARDIAC) 20 MG/ML IV SOLN
INTRAVENOUS | Status: DC | PRN
  Administered 2013-11-16: 80 mg via INTRAVENOUS

## 2013-11-16 MED ORDER — OXYCODONE-ACETAMINOPHEN 10-325 MG PO TABS: 1.0000 | ORAL_TABLET | Freq: Four times a day (QID) | ORAL | Status: DC | PRN

## 2013-11-16 MED ORDER — MIDAZOLAM HCL 5 MG/5ML IJ SOLN
INTRAMUSCULAR | Status: DC | PRN
  Administered 2013-11-16: 2 mg via INTRAVENOUS

## 2013-11-16 MED ORDER — MIDAZOLAM HCL 2 MG/2ML IJ SOLN
INTRAMUSCULAR | Status: AC
  Filled 2013-11-16: qty 2

## 2013-11-16 MED ORDER — ONDANSETRON HCL 4 MG/2ML IJ SOLN
INTRAMUSCULAR | Status: DC | PRN
  Administered 2013-11-16: 4 mg via INTRAVENOUS

## 2013-11-16 MED ORDER — MIDAZOLAM HCL 2 MG/2ML IJ SOLN: 1.0000 mg | INTRAMUSCULAR | Status: DC | PRN

## 2013-11-16 MED ORDER — FENTANYL CITRATE 0.05 MG/ML IJ SOLN: 50.0000 ug | INTRAMUSCULAR | Status: DC | PRN

## 2013-11-16 MED ORDER — FENTANYL CITRATE 0.05 MG/ML IJ SOLN
INTRAMUSCULAR | Status: DC | PRN
  Administered 2013-11-16 (×2): 50 ug via INTRAVENOUS

## 2013-11-16 MED ORDER — FENTANYL CITRATE 0.05 MG/ML IJ SOLN
INTRAMUSCULAR | Status: AC
  Filled 2013-11-16: qty 6

## 2013-11-16 MED ORDER — PROPOFOL 10 MG/ML IV BOLUS
INTRAVENOUS | Status: DC | PRN
  Administered 2013-11-16: 180 mg via INTRAVENOUS

## 2013-11-16 MED ORDER — HEPARIN SOD (PORK) LOCK FLUSH 100 UNIT/ML IV SOLN
INTRAVENOUS | Status: DC | PRN
  Administered 2013-11-16: 500 [IU] via INTRAVENOUS

## 2013-11-16 MED ORDER — HEPARIN SOD (PORK) LOCK FLUSH 100 UNIT/ML IV SOLN
INTRAVENOUS | Status: AC
  Filled 2013-11-16: qty 5

## 2013-11-16 MED ORDER — BUPIVACAINE HCL (PF) 0.25 % IJ SOLN
INTRAMUSCULAR | Status: DC | PRN
  Administered 2013-11-16: 10 mL

## 2013-11-16 MED ORDER — BUPIVACAINE-EPINEPHRINE (PF) 0.25% -1:200000 IJ SOLN
INTRAMUSCULAR | Status: AC
Start: 2013-11-16 — End: 2013-11-16
  Filled 2013-11-16: qty 30

## 2013-11-16 SURGICAL SUPPLY — 55 items
ADH SKN CLS APL DERMABOND .7 (GAUZE/BANDAGES/DRESSINGS) ×1
APL SKNCLS STERI-STRIP NONHPOA (GAUZE/BANDAGES/DRESSINGS)
BAG DECANTER FOR FLEXI CONT (MISCELLANEOUS) ×3 IMPLANT
BENZOIN TINCTURE PRP APPL 2/3 (GAUZE/BANDAGES/DRESSINGS) ×1 IMPLANT
BLADE 11 SAFETY STRL DISP (BLADE) ×1 IMPLANT
BLADE 15 SAFETY STRL DISP (BLADE) ×1 IMPLANT
BLADE SURG 15 STRL LF DISP TIS (BLADE) IMPLANT
BLADE SURG 15 STRL SS (BLADE) ×3
CANISTER SUCT 1200ML W/VALVE (MISCELLANEOUS) IMPLANT
CHLORAPREP W/TINT 26ML (MISCELLANEOUS) ×3 IMPLANT
CLOSURE WOUND 1/2 X4 (GAUZE/BANDAGES/DRESSINGS)
COVER MAYO STAND STRL (DRAPES) ×3 IMPLANT
COVER TABLE BACK 60X90 (DRAPES) ×3 IMPLANT
DECANTER SPIKE VIAL GLASS SM (MISCELLANEOUS) IMPLANT
DERMABOND ADVANCED (GAUZE/BANDAGES/DRESSINGS) ×2
DERMABOND ADVANCED .7 DNX12 (GAUZE/BANDAGES/DRESSINGS) ×1 IMPLANT
DRAPE C-ARM 42X72 X-RAY (DRAPES) ×3 IMPLANT
DRAPE LAPAROSCOPIC ABDOMINAL (DRAPES) ×3 IMPLANT
DRSG TEGADERM 4X4.75 (GAUZE/BANDAGES/DRESSINGS) IMPLANT
ELECT COATED BLADE 2.86 ST (ELECTRODE) ×3 IMPLANT
ELECT REM PT RETURN 9FT ADLT (ELECTROSURGICAL) ×3
ELECTRODE REM PT RTRN 9FT ADLT (ELECTROSURGICAL) ×1 IMPLANT
GLOVE BIO SURGEON STRL SZ7 (GLOVE) ×3 IMPLANT
GLOVE BIOGEL M STRL SZ7.5 (GLOVE) ×2 IMPLANT
GLOVE BIOGEL PI IND STRL 7.5 (GLOVE) ×1 IMPLANT
GLOVE BIOGEL PI IND STRL 8 (GLOVE) IMPLANT
GLOVE BIOGEL PI INDICATOR 7.5 (GLOVE) ×2
GLOVE BIOGEL PI INDICATOR 8 (GLOVE) ×2
GOWN STRL REUS W/ TWL LRG LVL3 (GOWN DISPOSABLE) ×4 IMPLANT
GOWN STRL REUS W/TWL LRG LVL3 (GOWN DISPOSABLE) ×6
IV KIT MINILOC 20X1 SAFETY (NEEDLE) IMPLANT
KIT PORT POWER 8FR ISP CVUE (Catheter) ×2 IMPLANT
NDL HYPO 25X1 1.5 SAFETY (NEEDLE) ×1 IMPLANT
NDL SAFETY ECLIPSE 18X1.5 (NEEDLE) IMPLANT
NEEDLE HYPO 18GX1.5 SHARP (NEEDLE)
NEEDLE HYPO 25X1 1.5 SAFETY (NEEDLE) ×3 IMPLANT
PACK BASIN DAY SURGERY FS (CUSTOM PROCEDURE TRAY) ×3 IMPLANT
PENCIL BUTTON HOLSTER BLD 10FT (ELECTRODE) ×3 IMPLANT
SLEEVE SCD COMPRESS KNEE MED (MISCELLANEOUS) ×3 IMPLANT
SPONGE GAUZE 4X4 12PLY STER LF (GAUZE/BANDAGES/DRESSINGS) ×3 IMPLANT
STAPLER VISISTAT 35W (STAPLE) ×1 IMPLANT
STRIP CLOSURE SKIN 1/2X4 (GAUZE/BANDAGES/DRESSINGS) ×1 IMPLANT
SUT MON AB 4-0 PC3 18 (SUTURE) ×3 IMPLANT
SUT PROLENE 2 0 SH DA (SUTURE) ×3 IMPLANT
SUT SILK 2 0 TIES 17X18 (SUTURE)
SUT SILK 2-0 18XBRD TIE BLK (SUTURE) IMPLANT
SUT VIC AB 3-0 SH 27 (SUTURE) ×3
SUT VIC AB 3-0 SH 27X BRD (SUTURE) ×1 IMPLANT
SYR 5ML LUER SLIP (SYRINGE) ×3 IMPLANT
SYR CONTROL 10ML LL (SYRINGE) ×3 IMPLANT
TOWEL OR 17X24 6PK STRL BLUE (TOWEL DISPOSABLE) ×3 IMPLANT
TOWEL OR NON WOVEN STRL DISP B (DISPOSABLE) ×3 IMPLANT
TUBE CONNECTING 20'X1/4 (TUBING)
TUBE CONNECTING 20X1/4 (TUBING) IMPLANT
YANKAUER SUCT BULB TIP NO VENT (SUCTIONS) IMPLANT

## 2013-11-16 NOTE — Discharge Instructions (Signed)
PORT-A-CATH: POST OP INSTRUCTIONS  Always review your discharge instruction sheet given to you by the facility where your surgery was performed.   1. A prescription for pain medication may be given to you upon discharge. Take your pain medication as prescribed, if needed. If narcotic pain medicine is not needed, then you make take acetaminophen (Tylenol) or ibuprofen (Advil) as needed.  2. Take your usually prescribed medications unless otherwise directed. 3. If you need a refill on your pain medication, please contact our office. All narcotic pain medicine now requires a paper prescription.  Phoned in and fax refills are no longer allowed by law.  Prescriptions will not be filled after 5 pm or on weekends.  4. You should follow a light diet for the remainder of the day after your procedure. 5. Most patients will experience some mild swelling and/or bruising in the area of the incision. It may take several days to resolve. 6. It is common to experience some constipation if taking pain medication after surgery. Increasing fluid intake and taking a stool softener (such as Colace) will usually help or prevent this problem from occurring. A mild laxative (Milk of Magnesia or Miralax) should be taken according to package directions if there are no bowel movements after 48 hours.  7. Unless discharge instructions indicate otherwise, you may remove your bandages 48 hours after surgery, and you may shower at that time. You may have steri-strips (small white skin tapes) in place directly over the incision.  These strips should be left on the skin for 7-10 days.  If your surgeon used Dermabond (skin glue) on the incision, you may shower in 24 hours.  The glue will flake off over the next 2-3 weeks.  8. If your port is left accessed at the end of surgery (needle left in port), the dressing cannot get wet and should only by changed by a healthcare professional. When the port is no longer accessed (when the  needle has been removed), follow step 7.   9. ACTIVITIES:  Limit activity involving your arms for the next 72 hours. Do no strenuous exercise or activity for 1 week. You may drive when you are no longer taking prescription pain medication, you can comfortably wear a seatbelt, and you can maneuver your car. 10.You may need to see your doctor in the office for a follow-up appointment.  Please       check with your doctor.  11.When you receive a new Port-a-Cath, you will get a product guide and        ID card.  Please keep them in case you need them.  WHEN TO CALL YOUR DOCTOR [phone]): 1. Fever over 101.0 2. Chills 3. Continued bleeding from incision 4. Increased redness and tenderness at the site 5. Shortness of breath, difficulty breathing   The clinic staff is available to answer your questions during regular business hours. Please dont hesitate to call and ask to speak to one of the nurses or medical assistants for clinical concerns. If you have a medical emergency, go to the nearest emergency room or call 911.  A surgeon from [facility] is always on call at the hospital.     For further information, please visit [email or web address]   Post Anesthesia Home Care Instructions  Activity: Get plenty of rest for the remainder of the day. A responsible adult should stay with you for 24 hours following the procedure.  For the next 24 hours, DO NOT: -Drive a car -  Operate machinery -Drink alcoholic beverages -Take any medication unless instructed by your physician -Make any legal decisions or sign important papers.  Meals: Start with liquid foods such as gelatin or soup. Progress to regular foods as tolerated. Avoid greasy, spicy, heavy foods. If nausea and/or vomiting occur, drink only clear liquids until the nausea and/or vomiting subsides. Call your physician if vomiting continues.  Special Instructions/Symptoms: Your throat may feel dry or sore from the  anesthesia or the breathing tube placed in your throat during surgery. If this causes discomfort, gargle with warm salt water. The discomfort should disappear within 24 hours.

## 2013-11-16 NOTE — Anesthesia Procedure Notes (Signed)
Procedure Name: LMA Insertion Date/Time: 11/16/2013 12:39 PM Performed by: Lyndee Leo Pre-anesthesia Checklist: Patient identified, Emergency Drugs available, Suction available and Patient being monitored Patient Re-evaluated:Patient Re-evaluated prior to inductionOxygen Delivery Method: Circle System Utilized Preoxygenation: Pre-oxygenation with 100% oxygen Intubation Type: IV induction Ventilation: Mask ventilation without difficulty LMA: LMA inserted LMA Size: 4.0 Number of attempts: 1 Airway Equipment and Method: bite block Placement Confirmation: positive ETCO2 Tube secured with: Tape Dental Injury: Teeth and Oropharynx as per pre-operative assessment

## 2013-11-16 NOTE — Interval H&P Note (Signed)
History and Physical Interval Note:  11/16/2013 10:48 AM  Carmen Stone  has presented today for surgery, with the diagnosis of breast cancer  The various methods of treatment have been discussed with the patient and family. After consideration of risks, benefits and other options for treatment, the patient has consented to  Procedure(s): INSERTION PORT-A-CATH (N/A) as a surgical intervention .  The patient's history has been reviewed, patient examined, no change in status, stable for surgery.  I have reviewed the patient's chart and labs.  Questions were answered to the patient's satisfaction.     [name]

## 2013-11-16 NOTE — H&P (View-Only) (Signed)
Subjective:     Patient ID: Carmen Stone, female   DOB: September 02, 1955, 58 y.o.   MRN: [id number]  HPI This is a 58 year old female who presented to the hospital and was seen by one of my partners for what appeared to be a necrotizing soft tissue infection of the right breast. He debrided this in the operating room. There was air coming out of this when he did this. She was still able in this looked to be infected with passage of air out of this wound and I returned to the operating room. I essentially ended up doing a mastectomy but this was just due to fact that the tissue was all dead and necrotic. Her pathology came back as inflammatory breast cancer. I then she returned to the operating room and  I did do an axillary lymph node dissection and closed the entire cavity. She has remained on antibiotics. She had some of her stitches in a drain removed last week. She returns today to discuss port placement. She is also due to see oncology next week.  Review of Systems     Objective:   Physical Exam Right mastectomy wound healed without infection, stitches in place, drain with no output present    Assessment:     Inflammatory breast cancer     Plan:     I think her infection has been cleared. I will leave her stitches in. We'll plan on taking these out in 2 more weeks. I did remove her drain today. She is going to remain on antibiotics until complete. I discussed with her port placement and the risks and benefits associated with that today. I will plan on doing this next week. She is due to see medical oncology next week also.

## 2013-11-16 NOTE — Transfer of Care (Signed)
Immediate Anesthesia Transfer of Care Note  Patient: Carmen Stone  Procedure(s) Performed: Procedure(s): INSERTION PORT-A-CATH (N/A)  Patient Location: PACU  Anesthesia Type:General  Level of Consciousness: awake, sedated and patient cooperative  Airway & Oxygen Therapy: Patient Spontanous Breathing and Patient connected to face mask oxygen  Post-op Assessment: Report given to PACU RN and Post -op Vital signs reviewed and stable  Post vital signs: Reviewed and stable  Complications: No apparent anesthesia complications

## 2013-11-16 NOTE — Anesthesia Preprocedure Evaluation (Addendum)
Anesthesia Evaluation  Patient identified by MRN, date of birth, ID band Patient awake    Reviewed: Allergy & Precautions, H&P , NPO status , Patient's Chart, lab work & pertinent test results, reviewed documented beta blocker date and time   Airway Mallampati: II TM Distance: >3 FB Neck ROM: full    Dental   Pulmonary neg pulmonary ROS,  breath sounds clear to auscultation        Cardiovascular negative cardio ROS  Rhythm:regular  11-03-13 2 D ECHO ------------------------------------------------------------------- Study Conclusions  - Left ventricle: The cavity size was normal. Systolic function was   normal. The estimated ejection fraction was in the range of 55%   to 60%. Wall motion was normal; there were no regional wall   motion abnormalities. - Mitral valve: There was mild regurgitation. - Left atrium: The atrium was moderately dilated.   Neuro/Psych negative neurological ROS  negative psych ROS   GI/Hepatic negative GI ROS, Neg liver ROS,   Endo/Other  diabetes, Insulin Dependent  Renal/GU ARFRenal disease  negative genitourinary   Musculoskeletal   Abdominal   Peds  Hematology  (+) Blood dyscrasia, anemia ,   Anesthesia Other Findings See surgeon's H&P   Reproductive/Obstetrics negative OB ROS                         Anesthesia Physical Anesthesia Plan  ASA: III  Anesthesia Plan: General   Post-op Pain Management:    Induction: Intravenous  Airway Management Planned: LMA  Additional Equipment:   Intra-op Plan:   Post-operative Plan:   Informed Consent: I have reviewed the patients History and Physical, chart, labs and discussed the procedure including the risks, benefits and alternatives for the proposed anesthesia with the patient or authorized representative who has indicated his/her understanding and acceptance.   Dental Advisory Given  Plan Discussed with: CRNA  and Surgeon  Anesthesia Plan Comments:         Anesthesia Quick Evaluation

## 2013-11-16 NOTE — Op Note (Signed)
Preoperative diagnosis: Right inflammatory breast cancer Postoperative diagnosis: Same as above Procedure: Left subclavian power port insertion Surgeon: Dr. Serita Grammes Anesthesia: Gen. Estimated blood loss: Minimal Drains: None Complications: None Specimens: None Sponge and needle count was correct at completion Disposition to recovery stable  Indications: This is a 58 year old female who I met in the hospital for a necrotizing soft tissue infection of the right breast and then it being a infected inflammatory breast cancer. She basically is underwent a modified radical mastectomy due to the infection. She has cleared her infection. She has had her drains removed. Her white blood cell count is normal. We now discussed placing a port to begin chemotherapy soon.  Procedure: After informed consent was obtained the patient was taken to the operating room. She was given cefazolin. Sequential compression devices were on her legs. She was then placed under general anesthesia without complication. She was then appropriately positioned and padded. She was then prepped and draped in the standard sterile surgical fashion. A surgical timeout was then performed.  I was able to access her subclavian vein after several passes. I then placed the wire. This was confirmed to be in position by fluoroscopy. I then made a pocket below this overlying the pectoralis fascia. I then tunneled the line between the sites. I dilated the tract and then placed the peel-away sheath. The line was placed into the peel-away sheath and pulled back into the distal cava. It is in about the mid cava but she was having a lot of ectopy when it was in deeper so I left it in this position. I then attached the port. I secured the port in 2 places with 2-0 Prolene suture. The port flushed easily and aspirated blood. It was packed with heparin. This was then closed with 3-0 Vicryl, 4-0 Monocryl, and Dermabond. She tolerated this well was  extubated and transferred to recovery stable.

## 2013-11-16 NOTE — Anesthesia Postprocedure Evaluation (Signed)
Anesthesia Post Note  Patient: Carmen Stone  Procedure(s) Performed: Procedure(s) (LRB): INSERTION PORT-A-CATH (N/A)  Anesthesia type: General  Patient location: PACU  Post pain: Pain level controlled  Post assessment: Patient's Cardiovascular Status Stable  Last Vitals:  Filed Vitals:   11/16/13 1353  BP:   Pulse: 75  Temp:   Resp: 15    Post vital signs: Reviewed and stable  Level of consciousness: alert  Complications: No apparent anesthesia complications

## 2013-11-17 ENCOUNTER — Encounter (HOSPITAL_BASED_OUTPATIENT_CLINIC_OR_DEPARTMENT_OTHER): Payer: Self-pay | Admitting: General Surgery

## 2013-11-17 ENCOUNTER — Encounter (INDEPENDENT_AMBULATORY_CARE_PROVIDER_SITE_OTHER): Payer: BC Managed Care – PPO | Admitting: General Surgery

## 2013-11-18 ENCOUNTER — Ambulatory Visit (HOSPITAL_BASED_OUTPATIENT_CLINIC_OR_DEPARTMENT_OTHER): Payer: BC Managed Care – PPO | Admitting: Oncology

## 2013-11-18 VITALS — BP 170/79 | HR 87 | Temp 98.3°F | Resp 18 | Ht 64.0 in | Wt 168.7 lb

## 2013-11-18 DIAGNOSIS — Z8719 Personal history of other diseases of the digestive system: Secondary | ICD-10-CM

## 2013-11-18 DIAGNOSIS — E785 Hyperlipidemia, unspecified: Secondary | ICD-10-CM

## 2013-11-18 DIAGNOSIS — C773 Secondary and unspecified malignant neoplasm of axilla and upper limb lymph nodes: Secondary | ICD-10-CM

## 2013-11-18 DIAGNOSIS — N611 Abscess of the breast and nipple: Secondary | ICD-10-CM

## 2013-11-18 DIAGNOSIS — R0902 Hypoxemia: Secondary | ICD-10-CM

## 2013-11-18 DIAGNOSIS — E119 Type 2 diabetes mellitus without complications: Secondary | ICD-10-CM

## 2013-11-18 DIAGNOSIS — J9600 Acute respiratory failure, unspecified whether with hypoxia or hypercapnia: Secondary | ICD-10-CM

## 2013-11-18 DIAGNOSIS — J9819 Other pulmonary collapse: Secondary | ICD-10-CM

## 2013-11-18 DIAGNOSIS — Z171 Estrogen receptor negative status [ER-]: Secondary | ICD-10-CM

## 2013-11-18 DIAGNOSIS — J9601 Acute respiratory failure with hypoxia: Secondary | ICD-10-CM

## 2013-11-18 DIAGNOSIS — C50919 Malignant neoplasm of unspecified site of unspecified female breast: Secondary | ICD-10-CM

## 2013-11-18 DIAGNOSIS — R112 Nausea with vomiting, unspecified: Secondary | ICD-10-CM

## 2013-11-18 DIAGNOSIS — R8290 Unspecified abnormal findings in urine: Secondary | ICD-10-CM

## 2013-11-18 DIAGNOSIS — D62 Acute posthemorrhagic anemia: Secondary | ICD-10-CM

## 2013-11-18 MED ORDER — LIDOCAINE-PRILOCAINE 2.5-2.5 % EX CREA: 1.0000 "application " | TOPICAL_CREAM | CUTANEOUS | Status: DC | PRN

## 2013-11-18 MED ORDER — DEXAMETHASONE 4 MG PO TABS: ORAL_TABLET | ORAL | Status: DC

## 2013-11-18 MED ORDER — ONDANSETRON HCL 8 MG PO TABS: 8.0000 mg | ORAL_TABLET | Freq: Two times a day (BID) | ORAL | Status: DC | PRN

## 2013-11-18 MED ORDER — PROCHLORPERAZINE MALEATE 10 MG PO TABS: 10.0000 mg | ORAL_TABLET | Freq: Four times a day (QID) | ORAL | Status: DC | PRN

## 2013-11-18 MED ORDER — OXYCODONE HCL 5 MG PO TABS: 5.0000 mg | ORAL_TABLET | ORAL | Status: DC | PRN

## 2013-11-18 MED ORDER — LORAZEPAM 0.5 MG PO TABS: 0.5000 mg | ORAL_TABLET | Freq: Every evening | ORAL | Status: DC | PRN

## 2013-11-18 NOTE — Progress Notes (Signed)
Carmen Stone  [phone] Fax:(336) [phone]     ID: Carmen Stone OB: 11/17/1955  MR#: [id number]  YWV#:[id number]  PCP: [name], MD GYN:   SU: [name] MD OTHER MD:  CHIEF COMPLAINT: locally advanced breast cancer TREATMENT: adjuvant chemotherapy  BREAST CANCER HISTORY: Carmen Stone presented to the emergency room on 10/22/2013 complaining of right breast pain and discharge. On exam the breast was diffusely erythematous with" orange peel" appearance. She was admitted for debridement and surgery he describes a skin defect in the lower inner quadrant measuring approximately 4 cm. When the breast was breast, air came out through the hole--there are photos included with the admission note. A chest x-ray was obtained which showed the right breast to be asymmetrically enlarged with extensive subcutaneous gas.  On the same day the patient underwent debridement of the wound, with the pathology (SZA [id number]) showing high-grade carcinoma involving ulcerated skin. There was tumor within the dermal and subcutaneous lymphatics. The tumor was negative for estrogen, progesterone, and HER-2(signals ratio 1.24, number per cell 3.6), with an MIB-1 of 95%. Gross cystic disease fluid protein was also negative.  The patient was admitted and started on antibiotics. On 10/24/2013 an attempt was made at further debridement, but the inside of the breast essentially came out in hand falls. The pathology from this procedure(SZA 15-20 and 26) again showed high-grade carcinoma.  On 10/25/2013 the patient underwent CT scans of the chest abdomen and pelvis. Aside from disease in the breast and right axilla there was no evidence of metastatic disease.  Accordingly Dr. [name] proceeded to right mastectomy with axillary lymph node dissection 10/26/2013. Results of this procedure ([id number]) showed a 15 cm defect in the middle of the breast, which is 4 prior necrotic  tissue had been scoped out. There was nevertheless extensive invasive ductal carcinoma, grade 3, in the specimen and repeat or blastic panel was again triple negative. 2 of 16 axillary lymph nodes were involved by macrometastatic deposits. The patient was continued on antibiotics and eventually discharged 11/03/2013.  Her subsequent history is as detailed below  INTERVAL HISTORY: The patient was evaluated in the breast clinic 11/18/2013 accompanied by her daughter [name] and her father-in-law, [place]: Since discharge home the patient has been doing relatively well. She is tolerating her antibiotics with no diarrhea or stomach upset. There has been no discharge or bleeding from the chest wound. She denies unusual headaches, visual changes, dizziness, or gait imbalance. There is some soreness well-controlled on current medications, which are not causing her constipation. A detailed review of systems today was otherwise noncontributory  PAST MEDICAL HISTORY: Past Medical History  Diagnosis Date  . Diabetes mellitus, type II   . Inflammatory breast cancer     PAST SURGICAL HISTORY: Past Surgical History  Procedure Laterality Date  . Cesarean section    . Ventral hernia repair  2011    repair incacerated VH with biologic mesh; cholecystectomy  . Cholecystectomy  2011  . Incision and drainage of wound Right 10/22/2013    Procedure: IRRIGATION AND DRAINAGE AND DEBRIDEMENT RIGHT BREAST WOUND;  Surgeon: [name], MD;  Location: [facility];  Service: General;  Laterality: Right;  . Breast biopsy Right 10/22/2013    Procedure: BREAST BIOPSY;  Surgeon: [name], MD;  Location: [place];  Service: General;  Laterality: Right;  . Irrigation and debridement abscess Right 10/24/2013    Procedure: Right total mastectomy;  Surgeon: [name], MD;  Location: [place];  Service: General;  Laterality: Right;  . Dressing change under anesthesia Right 10/26/2013    Procedure:  COMPLETION MASTECTOMY, AXILLARY DISSECTION;  Surgeon: [name], MD;  Location: [place];  Service: General;  Laterality: Right;  . Portacath placement N/A 11/16/2013    Procedure: INSERTION PORT-A-CATH;  Surgeon: [name], MD;  Location: [place];  Service: General;  Laterality: N/A;    FAMILY HISTORY No family history on file. The patient's father was a heavy smoker and died from complications of emphysema at the age of 63. The patient's mother died from lung cancer at the age of 9. She also was a heavy smoker. The patient had one brother, no sisters. There is no history of breast or ovarian cancer in the family.  GYNECOLOGIC HISTORY:  Menarche age 87, first live birth age 63, the patient is GX P1. She went through the change of life approximately age 53. She did not take hormone replacement.  SOCIAL HISTORY:  Timiya is divorced. She works for [employer] and also works a second job at [employer] on [employer]. She plans to be on disability during her chemoradiation treatments. She lives alone, but her daughter [name] frequently stays there overnight. [name] was [employer]. 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 She spends her time between helping her mom and helping her grandfather, Luwanda Starr, who lives in [place] and has significant dementia. Mr. Probert was present at the 11/18/2013 visit only because [name] wanted to be present and otherwise no one would have been with Mr. Ehresman. Jianni has no grandchildren. She attends a [employer] adult church    ADVANCED DIRECTIVES: In place. The patient has named her brother Clide Cliff as her healthcare power of attorney. Iona Beard can be reached at [phone]. He is a Pharmacist, hospital locally  HEALTH MAINTENANCE: History  Substance Use Topics  . Smoking status: Never Smoker   . Smokeless tobacco: Not on file  . Alcohol Use: No     Colonoscopy:  PAP:  Bone density:  Lipid panel:  No Known  Allergies  Current Outpatient Prescriptions  Medication Sig Dispense Refill  . acetaminophen (TYLENOL) 325 MG tablet Take 650 mg by mouth every 6 (six) hours as needed for mild pain.      Marland Kitchen glucose blood test strip Use as instructed  100 each  12  . glucose monitoring kit (FREESTYLE) monitoring kit 1 each by Does not apply route as needed for other. Check blood sugars daily.  1 each  0  . Insulin Glargine (LANTUS SOLOSTAR) 100 UNIT/ML Solostar Pen Inject 10 Units into the skin daily at 10 pm.  15 mL  3  . linagliptin (TRADJENTA) 5 MG TABS tablet Take 1 tablet (5 mg total) by mouth daily.  30 tablet  0  . oxyCODONE (OXY IR/ROXICODONE) 5 MG immediate release tablet Take 1-2 tablets (5-10 mg total) by mouth every 4 (four) hours as needed for moderate pain, severe pain or breakthrough pain.  30 tablet  0  . oxyCODONE-acetaminophen (PERCOCET) 10-325 MG per tablet Take 1 tablet by mouth every 6 (six) hours as needed for pain.  20 tablet  0  . pantoprazole (PROTONIX) 40 MG tablet Take 1 tablet (40 mg total) by mouth daily at 6 (six) AM.  30 tablet  0  . traMADol (ULTRAM) 50 MG tablet Take 1 tablet (50 mg total) by mouth every 6 (six) hours.  60 tablet  0  . dexamethasone (DECADRON) 4 MG  tablet Take 2 tablets by mouth once a day on the day after chemotherapy and then take 2 tablets two times a day for 2 days. Take with food.  30 tablet  1  . lidocaine-prilocaine (EMLA) cream Apply 1 application topically as needed. Apply over port area 1-2 hours before treatment, cover with plastic wrap  30 g  0  . LORazepam (ATIVAN) 0.5 MG tablet Take 1 tablet (0.5 mg total) by mouth at bedtime as needed (Nausea or vomiting).  30 tablet  0  . ondansetron (ZOFRAN) 8 MG tablet Take 1 tablet (8 mg total) by mouth 2 (two) times daily as needed. Start on the third day after chemotherapy.  30 tablet  1  . prochlorperazine (COMPAZINE) 10 MG tablet Take 1 tablet (10 mg total) by mouth every 6 (six) hours as needed (Nausea or  vomiting).  30 tablet  1   No current facility-administered medications for this visit.    OBJECTIVE: Middle-aged [place] American woman in no acute distress Filed Vitals:   11/18/13 1730  BP: 170/79  Pulse: 87  Temp: 98.3 F (36.8 C)  Resp: 18     Body mass index is 28.94 kg/(m^2).    ECOG FS:1 - Symptomatic but completely ambulatory  Ocular: Sclerae unicteric, pupils equal, round and reactive to light Ear-nose-throat: Oropharynx clear, full upper and lower dentures Lymphatic: No cervical or supraclavicular adenopathy Lungs no rales or rhonchi, good excursion bilaterally Heart regular rate and rhythm, no murmur appreciated Abd soft, moderately obese, nontender, positive bowel sounds MSK no focal spinal tenderness, no upper extremity lymphedema Neuro: non-focal, well-oriented, positive affect Breasts: The right breast is status post mastectomy. There is no evidence of dehiscence or unusual erythema or tenderness. Stitches are still in place. In the medial aspect of the incision superiorly there is a 3 cm protuberance which may well be simply scar irregularity from the procedure. This area was photographed today. I do not palpate any right axillary masses. There is no rash or skin changes of concern. The left breast was unremarkable  LAB RESULTS:  CMP     Component Value Date/Time   NA 142 11/16/2013 0847   K 4.1 11/16/2013 0847   CL 106 11/16/2013 0847   CO2 27 11/07/2013 0540   GLUCOSE 149* 11/16/2013 0847   BUN 25* 11/16/2013 0847   CREATININE 1.20* 11/16/2013 0847   CALCIUM 8.8 11/07/2013 0540   PROT 6.5 10/30/2013 1014   ALBUMIN 2.2* 10/30/2013 1014   AST 20 10/30/2013 1014   ALT 6 10/30/2013 1014   ALKPHOS 70 10/30/2013 1014   BILITOT <0.2* 10/30/2013 1014   GFRNONAA 37* 11/07/2013 0540   GFRAA 43* 11/07/2013 0540    I No results found for this basename: SPEP,  UPEP,   kappa and lambda light chains    Lab Results  Component Value Date   WBC 10.3 11/16/2013   NEUTROABS 7.3 11/16/2013    HGB 8.8* 11/16/2013   HCT 27.5* 11/16/2013   MCV 90.2 11/16/2013   PLT 348 11/16/2013      Chemistry      Component Value Date/Time   NA 142 11/16/2013 0847   K 4.1 11/16/2013 0847   CL 106 11/16/2013 0847   CO2 27 11/07/2013 0540   BUN 25* 11/16/2013 0847   CREATININE 1.20* 11/16/2013 0847      Component Value Date/Time   CALCIUM 8.8 11/07/2013 0540   ALKPHOS 70 10/30/2013 1014   AST 20 10/30/2013 1014   ALT 6  10/30/2013 1014   BILITOT <0.2* 10/30/2013 1014       No results found for this basename: LABCA2    No components found with this basename: [id number]    No results found for this basename: INR,  in the last 168 hours  Urinalysis    Component Value Date/Time   COLORURINE ORANGE* 10/22/2013 0159   APPEARANCEUR CLOUDY* 10/22/2013 0159   LABSPEC 1.043* 10/22/2013 0159   PHURINE 5.5 10/22/2013 0159   GLUCOSEU >1000* 10/22/2013 0159   HGBUR MODERATE* 10/22/2013 0159   BILIRUBINUR MODERATE* 10/22/2013 0159   KETONESUR 15* 10/22/2013 0159   PROTEINUR 100* 10/22/2013 0159   UROBILINOGEN 2.0* 10/22/2013 0159   NITRITE NEGATIVE 10/22/2013 0159   LEUKOCYTESUR SMALL* 10/22/2013 0159    STUDIES: Dg Chest 2 View  10/22/2013   CLINICAL DATA:  Right breast pain and swelling.  Fever.  EXAM: CHEST  2 VIEW  COMPARISON:  06/13/2010  FINDINGS: The right breast is asymmetrically enlarged with extensive subcutaneous gas. This creates increased density over the right chest. There is no evidence of pneumonia in the lateral projection. No cardiomegaly. No edema, effusion, or pneumothorax.  Critical Value/emergent results were called by telephone at the time of interpretation on 10/22/2013 at 4:17 AM to Dr. Kathrynn Humble , who verbally acknowledged these results.  IMPRESSION: Right breast swelling with extensive subcutaneous gas. If no recent intervention, findings concerning for necrotizing infection.   Electronically Signed   By: Jorje Guild M.D.   On: 10/22/2013 04:17   Ct Chest W Contrast  10/26/2013   CLINICAL DATA:   58 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 Evaluate for metastatic disease.  EXAM: CT CHEST, ABDOMEN, AND PELVIS WITH CONTRAST  TECHNIQUE: Multidetector CT imaging of the chest, abdomen and pelvis was performed following the standard protocol during bolus administration of intravenous contrast.  CONTRAST:  131m OMNIPAQUE IOHEXOL 300 MG/ML  SOLN  COMPARISON:  CT of abdomen pelvis 06/21/2010.  FINDINGS: CT CHEST FINDINGS  Mediastinum: Heart size is normal. There is no significant pericardial fluid, thickening or pericardial calcification. No pathologically enlarged mediastinal, bilateral hilar or internal mammary lymph nodes. Esophagus is unremarkable in appearance.  Lungs/Pleura: Trace bilateral pleural effusions layering dependently with some mild dependent subsegmental atelectasis in the lower lobes of the lungs bilaterally. No acute consolidative airspace disease. No suspicious appearing pulmonary nodules or masses.  Musculoskeletal: Postoperative changes in the right breast are noted, with a large open wound filled with packing material. The periphery of the open wound demonstrates a thick rind of soft tissue, some of which appears to enhance. In the medial aspect of the right breast (image 27 of series 2) there is a 1.5 x 1.3 cm enhancing soft tissue lesion, suspicious for a focus of residual tumor or a malignant lymph node. Numerous borderline enlarged and mildly enlarged right axillary lymph nodes are noted, measuring up to 12 mm in short axis. There are no aggressive appearing lytic or blastic lesions noted in the visualized portions of the skeleton.  CT ABDOMEN AND PELVIS FINDINGS  Abdomen/Pelvis: Several well-defined low-attenuation lesions in the liver appear unchanged in size, number and pattern of distribution compared to prior study 2012, presumably small cysts. The largest of these lesions measures 2.1 cm in segment 4A. No other suspicious hepatic lesions are noted. Status post  cholecystectomy. The appearance of the pancreas, spleen, bilateral adrenal glands and bilateral kidneys is unremarkable.  Normal appendix (retrocecal in position). No significant volume of ascites. No pneumoperitoneum. No pathologic distention of  small bowel. There are a few colonic diverticulae, particularly in the region of the sigmoid colon, without surrounding inflammatory changes to suggest an acute diverticulitis at this time. Several small ventral hernias are noted, containing several loops of small bowel and a short segment of the mid transverse colon, without evidence of bowel incarceration or obstruction at this time. No lymphadenopathy identified within the abdomen or pelvis. Status post hysterectomy. Ovaries are not confidently identified may be surgically absent or atrophic.  Musculoskeletal: There are no aggressive appearing lytic or blastic lesions noted in the visualized portions of the skeleton. Edema throughout the fat of the right flank, presumably reactive to the process in the right breast.  IMPRESSION: 1. Postoperative changes of recent right breast surgery with packed open wound which has a thick rind of soft tissue, some of which enhances, a 1.3 x 1.5 cm enhancing lesion in the medial aspect of the right breast which may represent residual tumor or a malignant lymph node, and multiple enlarged right axillary nodes concerning for axillary nodal metastases. 2. No other definite signs of metastatic disease in the chest, abdomen or pelvis. 3. Multiple small ventral hernias containing several loops of small bowel, as well as the mid transverse colon, without associated bowel incarceration or obstruction at this time. 4. Trace bilateral pleural effusions with mild dependent subsegmental atelectasis in the lower lobes of the lungs bilaterally. 5. Status post cholecystectomy and hysterectomy. 6. Additional incidental findings, as above.   Electronically Signed   By: Vinnie Langton M.D.   On:  10/26/2013 08:35   Ct Abdomen Pelvis W Contrast  10/26/2013   CLINICAL DATA:  58 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 Evaluate for metastatic disease.  EXAM: CT CHEST, ABDOMEN, AND PELVIS WITH CONTRAST  TECHNIQUE: Multidetector CT imaging of the chest, abdomen and pelvis was performed following the standard protocol during bolus administration of intravenous contrast.  CONTRAST:  139m OMNIPAQUE IOHEXOL 300 MG/ML  SOLN  COMPARISON:  CT of abdomen pelvis 06/21/2010.  FINDINGS: CT CHEST FINDINGS  Mediastinum: Heart size is normal. There is no significant pericardial fluid, thickening or pericardial calcification. No pathologically enlarged mediastinal, bilateral hilar or internal mammary lymph nodes. Esophagus is unremarkable in appearance.  Lungs/Pleura: Trace bilateral pleural effusions layering dependently with some mild dependent subsegmental atelectasis in the lower lobes of the lungs bilaterally. No acute consolidative airspace disease. No suspicious appearing pulmonary nodules or masses.  Musculoskeletal: Postoperative changes in the right breast are noted, with a large open wound filled with packing material. The periphery of the open wound demonstrates a thick rind of soft tissue, some of which appears to enhance. In the medial aspect of the right breast (image 27 of series 2) there is a 1.5 x 1.3 cm enhancing soft tissue lesion, suspicious for a focus of residual tumor or a malignant lymph node. Numerous borderline enlarged and mildly enlarged right axillary lymph nodes are noted, measuring up to 12 mm in short axis. There are no aggressive appearing lytic or blastic lesions noted in the visualized portions of the skeleton.  CT ABDOMEN AND PELVIS FINDINGS  Abdomen/Pelvis: Several well-defined low-attenuation lesions in the liver appear unchanged in size, number and pattern of distribution compared to prior study 2012, presumably small cysts. The largest of these lesions measures 2.1  cm in segment 4A. No other suspicious hepatic lesions are noted. Status post cholecystectomy. The appearance of the pancreas, spleen, bilateral adrenal glands and bilateral kidneys is unremarkable.  Normal appendix (retrocecal in position). No  significant volume of ascites. No pneumoperitoneum. No pathologic distention of small bowel. There are a few colonic diverticulae, particularly in the region of the sigmoid colon, without surrounding inflammatory changes to suggest an acute diverticulitis at this time. Several small ventral hernias are noted, containing several loops of small bowel and a short segment of the mid transverse colon, without evidence of bowel incarceration or obstruction at this time. No lymphadenopathy identified within the abdomen or pelvis. Status post hysterectomy. Ovaries are not confidently identified may be surgically absent or atrophic.  Musculoskeletal: There are no aggressive appearing lytic or blastic lesions noted in the visualized portions of the skeleton. Edema throughout the fat of the right flank, presumably reactive to the process in the right breast.  IMPRESSION: 1. Postoperative changes of recent right breast surgery with packed open wound which has a thick rind of soft tissue, some of which enhances, a 1.3 x 1.5 cm enhancing lesion in the medial aspect of the right breast which may represent residual tumor or a malignant lymph node, and multiple enlarged right axillary nodes concerning for axillary nodal metastases. 2. No other definite signs of metastatic disease in the chest, abdomen or pelvis. 3. Multiple small ventral hernias containing several loops of small bowel, as well as the mid transverse colon, without associated bowel incarceration or obstruction at this time. 4. Trace bilateral pleural effusions with mild dependent subsegmental atelectasis in the lower lobes of the lungs bilaterally. 5. Status post cholecystectomy and hysterectomy. 6. Additional incidental  findings, as above.   Electronically Signed   By: Vinnie Langton M.D.   On: 10/26/2013 08:35   Dg Chest Portable 1 View  11/16/2013   CLINICAL DATA:  Central catheter placement  EXAM: PORTABLE CHEST - 1 VIEW  COMPARISON:  Chest radiograph Oct 22, 2013 and chest CT Oct 25, 2013  FINDINGS: Port-A-Cath tip is in the superior vena cava. No pneumothorax. There is mild left base atelectasis. Elsewhere lungs are clear. Heart size and pulmonary vascularity are normal. No adenopathy. There is postoperative change on the right with clips in the right axilla.  IMPRESSION: Port-A-Cath tip in superior vena cava. No pneumothorax. Mild left base atelectasis. Lungs otherwise clear.   Electronically Signed   By: Lowella Grip M.D.   On: 11/16/2013 13:58   Dg Fluoro Guide Cv Line-no Report  11/16/2013   CLINICAL DATA: PAC   FLOURO GUIDE CV LINE  Fluoroscopy was utilized by the requesting physician.  No radiographic  interpretation.     ASSESSMENT: 58 y.o. [place] woman  (1) status post right modified radical mastectomy 10/26/2013 for a pT3 pN1, stage IIIA invasive ductal carcinoma, grade 3, triple negative, with an MIB-1 of 95%   PLAN: We spent the better part of today's hour-long appointment discussing the biology of breast cancer in general, and the specifics of the patient's tumor in particular. Franca understands the treatment of breast cancer has local and systemic component. In her case the local component has been a modified radical mastectomy, and will include postmastectomy radiation once she is done with chemotherapy.  The systemic component will be chemotherapy, since she is not a candidate for antiestrogen or anti-HER-2 treatment. We do have very effective chemotherapy for triple negative breast cancer and this will consist of doxorubicin and cyclophosphamide in dose dense fashion x4, followed by carboplatin and paclitaxel weekly x12. We discussed the possible toxicities, side effects and  complications of these agents. The patient already has a port in place and has had an  echocardiogram with a very favorable ejection fraction.  I am setting her up for "chemotherapy school" next week. I put in the orders for her antinausea medications and gave her a prescription for lorazepam, which is the only medicine we cannot stand in. I am concerned regarding her history of diabetes, since dexamethasone will be her main antinausea drug. I have asked her to keep a record of her blood sugars before each meal beginning on the day of treatment and continuing for the next 2 days, and call us if that number rises above 250. If it does we may have to drop the dexamethasone and lean on Compazine as the primary antinausea agent, saving the dexamethasone for an " as needed" role. I also gave her a "map" of how to take her medications. She will bring this to the chemotherapy school review. Also today I wrote her for 30 additional oxycodone tablets, which she may need for the Neulasta associated bony pain.  Meranda has a good understanding of the overall plan. She knows to goal of treatment in her case is cure. She will call with any problems that may develop before her next visit here.  Chauncey Cruel, MD   11/19/2013 8:30 AM

## 2013-11-19 NOTE — Addendum Note (Signed)
Addended by: Chauncey Cruel on: 11/19/2013 09:14 AM   Modules accepted: Orders, SmartSet

## 2013-11-21 ENCOUNTER — Telehealth: Payer: Self-pay | Admitting: *Deleted

## 2013-11-21 ENCOUNTER — Telehealth: Payer: Self-pay | Admitting: Oncology

## 2013-11-21 NOTE — Telephone Encounter (Signed)
Called pt to give navigation resources.  Phone mailbox is full and unable to leave message.  Will call again at a later time.

## 2013-11-21 NOTE — Telephone Encounter (Signed)
s/w pt re next appts for 6/10 and 6/15. pt to get schedule when she comes in 6/10.

## 2013-11-23 ENCOUNTER — Encounter: Payer: Self-pay | Admitting: *Deleted

## 2013-11-23 ENCOUNTER — Other Ambulatory Visit: Payer: BC Managed Care – PPO

## 2013-11-28 ENCOUNTER — Ambulatory Visit (HOSPITAL_BASED_OUTPATIENT_CLINIC_OR_DEPARTMENT_OTHER): Payer: BC Managed Care – PPO

## 2013-11-28 ENCOUNTER — Ambulatory Visit (HOSPITAL_BASED_OUTPATIENT_CLINIC_OR_DEPARTMENT_OTHER): Payer: BC Managed Care – PPO | Admitting: Hematology and Oncology

## 2013-11-28 ENCOUNTER — Other Ambulatory Visit (HOSPITAL_BASED_OUTPATIENT_CLINIC_OR_DEPARTMENT_OTHER): Payer: BC Managed Care – PPO

## 2013-11-28 ENCOUNTER — Other Ambulatory Visit: Payer: Self-pay | Admitting: Oncology

## 2013-11-28 ENCOUNTER — Encounter: Payer: Self-pay | Admitting: Hematology and Oncology

## 2013-11-28 VITALS — BP 153/73 | HR 92 | Temp 98.4°F | Resp 18 | Ht 64.0 in | Wt 164.4 lb

## 2013-11-28 DIAGNOSIS — Z5111 Encounter for antineoplastic chemotherapy: Secondary | ICD-10-CM

## 2013-11-28 DIAGNOSIS — C773 Secondary and unspecified malignant neoplasm of axilla and upper limb lymph nodes: Secondary | ICD-10-CM

## 2013-11-28 DIAGNOSIS — E119 Type 2 diabetes mellitus without complications: Secondary | ICD-10-CM

## 2013-11-28 DIAGNOSIS — Z171 Estrogen receptor negative status [ER-]: Secondary | ICD-10-CM

## 2013-11-28 DIAGNOSIS — C50919 Malignant neoplasm of unspecified site of unspecified female breast: Secondary | ICD-10-CM

## 2013-11-28 DIAGNOSIS — R0902 Hypoxemia: Secondary | ICD-10-CM

## 2013-11-28 DIAGNOSIS — N611 Abscess of the breast and nipple: Secondary | ICD-10-CM

## 2013-11-28 LAB — COMPREHENSIVE METABOLIC PANEL (CC13)
ALBUMIN: 3.4 g/dL — AB (ref 3.5–5.0)
ALT: 10 U/L (ref 0–55)
AST: 14 U/L (ref 5–34)
Alkaline Phosphatase: 92 U/L (ref 40–150)
Anion Gap: 9 mEq/L (ref 3–11)
BUN: 16.8 mg/dL (ref 7.0–26.0)
CALCIUM: 9.4 mg/dL (ref 8.4–10.4)
CO2: 28 mEq/L (ref 22–29)
Chloride: 102 mEq/L (ref 98–109)
Creatinine: 1 mg/dL (ref 0.6–1.1)
Glucose: 144 mg/dl — ABNORMAL HIGH (ref 70–140)
POTASSIUM: 4.5 meq/L (ref 3.5–5.1)
SODIUM: 139 meq/L (ref 136–145)
TOTAL PROTEIN: 7.4 g/dL (ref 6.4–8.3)
Total Bilirubin: 0.22 mg/dL (ref 0.20–1.20)

## 2013-11-28 LAB — CBC WITH DIFFERENTIAL/PLATELET
BASO%: 0.6 % (ref 0.0–2.0)
Basophils Absolute: 0.1 10*3/uL (ref 0.0–0.1)
EOS%: 5.1 % (ref 0.0–7.0)
Eosinophils Absolute: 0.5 10*3/uL (ref 0.0–0.5)
HEMATOCRIT: 31.1 % — AB (ref 34.8–46.6)
HGB: 9.9 g/dL — ABNORMAL LOW (ref 11.6–15.9)
LYMPH%: 17 % (ref 14.0–49.7)
MCH: 28.6 pg (ref 25.1–34.0)
MCHC: 31.8 g/dL (ref 31.5–36.0)
MCV: 89.9 fL (ref 79.5–101.0)
MONO#: 0.9 10*3/uL (ref 0.1–0.9)
MONO%: 8.3 % (ref 0.0–14.0)
NEUT#: 7.4 10*3/uL — ABNORMAL HIGH (ref 1.5–6.5)
NEUT%: 69 % (ref 38.4–76.8)
Platelets: 377 10*3/uL (ref 145–400)
RBC: 3.46 10*6/uL — ABNORMAL LOW (ref 3.70–5.45)
RDW: 14.2 % (ref 11.2–14.5)
WBC: 10.7 10*3/uL — AB (ref 3.9–10.3)
lymph#: 1.8 10*3/uL (ref 0.9–3.3)
nRBC: 0 % (ref 0–0)

## 2013-11-28 MED ORDER — SODIUM CHLORIDE 0.9 % IJ SOLN
10.0000 mL | INTRAMUSCULAR | Status: DC | PRN
  Administered 2013-11-28: 10 mL
  Filled 2013-11-28: qty 10

## 2013-11-28 MED ORDER — PALONOSETRON HCL INJECTION 0.25 MG/5ML
0.2500 mg | Freq: Once | INTRAVENOUS | Status: AC
  Administered 2013-11-28: 0.25 mg via INTRAVENOUS

## 2013-11-28 MED ORDER — FOSAPREPITANT DIMEGLUMINE INJECTION 150 MG
150.0000 mg | Freq: Once | INTRAVENOUS | Status: AC
  Administered 2013-11-28: 150 mg via INTRAVENOUS
  Filled 2013-11-28: qty 5

## 2013-11-28 MED ORDER — DEXAMETHASONE SODIUM PHOSPHATE 20 MG/5ML IJ SOLN
12.0000 mg | Freq: Once | INTRAMUSCULAR | Status: AC
  Administered 2013-11-28: 12 mg via INTRAVENOUS

## 2013-11-28 MED ORDER — DOXORUBICIN HCL CHEMO IV INJECTION 2 MG/ML
60.0000 mg/m2 | Freq: Once | INTRAVENOUS | Status: AC
  Administered 2013-11-28: 112 mg via INTRAVENOUS
  Filled 2013-11-28: qty 56

## 2013-11-28 MED ORDER — SODIUM CHLORIDE 0.9 % IV SOLN
Freq: Once | INTRAVENOUS | Status: AC
  Administered 2013-11-28: 11:00:00 via INTRAVENOUS

## 2013-11-28 MED ORDER — PALONOSETRON HCL INJECTION 0.25 MG/5ML
INTRAVENOUS | Status: AC
  Filled 2013-11-28: qty 5

## 2013-11-28 MED ORDER — SODIUM CHLORIDE 0.9 % IV SOLN
600.0000 mg/m2 | Freq: Once | INTRAVENOUS | Status: AC
  Administered 2013-11-28: 1120 mg via INTRAVENOUS
  Filled 2013-11-28: qty 56

## 2013-11-28 MED ORDER — HEPARIN SOD (PORK) LOCK FLUSH 100 UNIT/ML IV SOLN
500.0000 [IU] | Freq: Once | INTRAVENOUS | Status: AC | PRN
Start: 2013-11-28 — End: 2013-11-28
  Administered 2013-11-28: 500 [IU]
  Filled 2013-11-28: qty 5

## 2013-11-28 NOTE — Patient Instructions (Signed)
[facility] Discharge Instructions for Patients Receiving Chemotherapy  Today you received the following chemotherapy agents: Adriamycin, Cytoxan   To help prevent nausea and vomiting after your treatment, we encourage you to take your nausea medication as prescribed.    If you develop nausea and vomiting that is not controlled by your nausea medication, call the clinic.   BELOW ARE SYMPTOMS THAT SHOULD BE REPORTED IMMEDIATELY:  *FEVER GREATER THAN 100.5 F  *CHILLS WITH OR WITHOUT FEVER  NAUSEA AND VOMITING THAT IS NOT CONTROLLED WITH YOUR NAUSEA MEDICATION  *UNUSUAL SHORTNESS OF BREATH  *UNUSUAL BRUISING OR BLEEDING  TENDERNESS IN MOUTH AND THROAT WITH OR WITHOUT PRESENCE OF ULCERS  *URINARY PROBLEMS  *BOWEL PROBLEMS  UNUSUAL RASH Items with * indicate a potential emergency and should be followed up as soon as possible.  Feel free to call the clinic you have any questions or concerns. The clinic phone number is [phone].

## 2013-11-28 NOTE — Progress Notes (Unsigned)
Called Carmen Stone just to make sure her sugars were not 25. She tells whether doing "fine", the high as 1 she has recorded is 160.  She once her stitches out. She will be calling the surgeon regarding.  She knows to call for any other problems that may develop

## 2013-11-28 NOTE — Progress Notes (Signed)
I gave billing ph# for patient. She needs asst with her bills. She says they are over 5000.00. She will bring back her letter of support from her brother since she has no income for [employer]

## 2013-11-28 NOTE — Progress Notes (Signed)
Patient completed first time adriamycin/cytoxan without any problems or complications. AVS reviewed with patient and her daughter at discharge. Cindi Carbon, RN

## 2013-11-28 NOTE — Progress Notes (Signed)
[place]  [phone] [phone]     ID: [name] OB: [birth year]  MR#: [id number]  DSK#:[id number]  PCP: [name], MD GYN:   SURolm Bookbinder MD OTHER MD:  CHIEF COMPLAINT: For initiation of cycle 1 chemotherapy with dose dense AC for locally advanced breast cancer  TREATMENT regimen: adjuvant chemotherapy with dose dense AC. followed by Taxol  BREAST CANCER HISTORY: As per previously dictated Dr.Magrinat's note:  Ms. Maske presented to the emergency room on 10/22/2013 complaining of right breast pain and discharge. On exam the breast was diffusely erythematous with" orange peel" appearance. She was admitted for debridement and surgery he describes a skin defect in the lower inner quadrant measuring approximately 4 cm. When the breast was breast, air came out through the hole--there are photos included with the admission note. A chest x-ray was obtained which showed the right breast to be asymmetrically enlarged with extensive subcutaneous gas.  On the same day the patient underwent debridement of the wound, with the pathology (SZA [id number]) showing high-grade carcinoma involving ulcerated skin. There was tumor within the dermal and subcutaneous lymphatics. The tumor was negative for estrogen, progesterone, and HER-2(signals ratio 1.24, number per cell 3.6), with an MIB-1 of 95%. Gross cystic disease fluid protein was also negative.  The patient was admitted and started on antibiotics. On 10/24/2013 an attempt was made at further debridement, but the inside of the breast essentially came out in hand falls. The pathology from this procedure(SZA 15-20 and 26) again showed high-grade carcinoma.  On 10/25/2013 the patient underwent CT scans of the chest abdomen and pelvis. Aside from disease in the breast and right axilla there was no evidence of metastatic disease.  Accordingly Dr. [name] proceeded to right mastectomy with  axillary lymph node dissection 10/26/2013. Results of this procedure ([id number]) showed a 15 cm defect in the middle of the breast, which is 4 prior necrotic tissue had been scoped out. There was nevertheless extensive invasive ductal carcinoma, grade 3, in the specimen and repeat or blastic panel was again triple negative. 2 of 16 axillary lymph nodes were involved by macrometastatic deposits. The patient was continued on antibiotics and eventually discharged 11/03/2013.  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  she has nausea vomiting medications as prescribed by Dr. Jana Hakim during the last last visit. She denies any nausea vomiting. Constipation blood in the stool blood in the urine. Denies any fever chills. Denis any headaches or blurred vision denies any neuropathy. She does not complain of any pain in the chest wall or no discharge. She says she is due to see the surgery next week for followup.  REVIEW OF SYSTEMS: A 10 point review of systems is been assessed and pertinent symptoms as mentioned in interval history   PAST MEDICAL HISTORY: Past Medical History  Diagnosis Date  . Diabetes mellitus, type II   . Inflammatory breast cancer     PAST SURGICAL HISTORY: Past Surgical History  Procedure Laterality Date  . Cesarean section    . Ventral hernia repair  2011    repair incacerated VH with biologic mesh; cholecystectomy  . Cholecystectomy  2011  . Incision and drainage of wound Right 10/22/2013    Procedure: IRRIGATION AND DRAINAGE AND  DEBRIDEMENT RIGHT BREAST WOUND;  Surgeon: [name], MD;  Location: [facility];  Service: General;  Laterality: Right;  . Breast biopsy Right 10/22/2013    Procedure: BREAST BIOPSY;  Surgeon: [name], MD;   Location: [facility];  Service: General;  Laterality: Right;  . Irrigation and debridement abscess Right 10/24/2013    Procedure: Right total mastectomy;  Surgeon: [name], MD;  Location: [facility];  Service: General;  Laterality: Right;  . Dressing change under anesthesia Right 10/26/2013    Procedure: COMPLETION MASTECTOMY, AXILLARY DISSECTION;  Surgeon: [name], MD;  Location: [facility];  Service: General;  Laterality: Right;  . Portacath placement N/A 11/16/2013    Procedure: INSERTION PORT-A-CATH;  Surgeon: [name], MD;  Location: [facility];  Service: General;  Laterality: N/A;    FAMILY HISTORY No family history on file. The patient's father was a heavy smoker and died from complications of emphysema at the age of 55. The patient's mother died from lung cancer at the age of 56. She also was a heavy smoker. The patient had one brother, no sisters. There is no history of breast or ovarian cancer in the family.  GYNECOLOGIC HISTORY:  Menarche age 58, first live birth age 36, the patient is GX P1. She went through the change of life approximately age 74. She did not take hormone replacement.  SOCIAL HISTORY:  Malachi is divorced. She works for [employer] and also works a second job at [employer] on [employer]. She plans to be on disability during her chemoradiation treatments. She lives alone, but her daughter [name] frequently stays there overnight. [name] was [employer]. 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 She spends her time between helping her mom and helping her grandfather, Adelaida Reindel, who lives in [place] and has significant dementia. Mr. Pendergraph was present at the 11/18/2013 visit only because [name] wanted to be present and otherwise no one would have been with Mr. Kashani. Glendia has no grandchildren. She attends a Nurse, adult church    ADVANCED DIRECTIVES: In place. The patient has named her brother Clide Cliff as her  healthcare power of attorney. Iona Beard can be reached at [phone]. He is a Pharmacist, hospital locally  HEALTH MAINTENANCE: History  Substance Use Topics  . Smoking status: Never Smoker   . Smokeless tobacco: Not on file  . Alcohol Use: No     Colonoscopy:  PAP:  Bone density:  Lipid panel:  No Known Allergies  Current Outpatient Prescriptions  Medication Sig Dispense Refill  . acetaminophen (TYLENOL) 325 MG tablet Take 650 mg by mouth every 6 (six) hours as needed for mild pain.      Marland Kitchen dexamethasone (DECADRON) 4 MG tablet Take 2 tablets by mouth once a day on the day after chemotherapy and then take 2 tablets two times a day for 2 days. Take with food.  30 tablet  1  . glucose blood test strip Use as instructed  100 each  12  . glucose monitoring kit (FREESTYLE) monitoring kit 1 each by Does not apply route as needed for other. Check blood sugars daily.  1 each  0  . Insulin Glargine (LANTUS SOLOSTAR) 100 UNIT/ML Solostar Pen Inject 10 Units into the skin daily at 10 pm.  15 mL  3  . lidocaine-prilocaine (EMLA) cream Apply 1 application topically as needed. Apply over port area 1-2 hours before treatment, cover with plastic wrap  30 g  0  . linagliptin (  TRADJENTA) 5 MG TABS tablet Take 1 tablet (5 mg total) by mouth daily.  30 tablet  0  . LORazepam (ATIVAN) 0.5 MG tablet Take 1 tablet (0.5 mg total) by mouth at bedtime as needed (Nausea or vomiting).  30 tablet  0  . ondansetron (ZOFRAN) 8 MG tablet Take 1 tablet (8 mg total) by mouth 2 (two) times daily as needed. Start on the third day after chemotherapy.  30 tablet  1  . oxyCODONE (OXY IR/ROXICODONE) 5 MG immediate release tablet Take 1-2 tablets (5-10 mg total) by mouth every 4 (four) hours as needed for moderate pain, severe pain or breakthrough pain.  30 tablet  0  . pantoprazole (PROTONIX) 40 MG tablet Take 1 tablet (40 mg total) by mouth daily at 6 (six) AM.  30 tablet  0  . prochlorperazine (COMPAZINE) 10 MG tablet Take 1 tablet (10 mg  total) by mouth every 6 (six) hours as needed (Nausea or vomiting).  30 tablet  1  . traMADol (ULTRAM) 50 MG tablet Take 1 tablet (50 mg total) by mouth every 6 (six) hours.  60 tablet  0  . oxyCODONE-acetaminophen (PERCOCET) 10-325 MG per tablet Take 1 tablet by mouth every 6 (six) hours as needed for pain.  20 tablet  0   No current facility-administered medications for this visit.    OBJECTIVE: Middle-aged [place] American woman in no acute distress Filed Vitals:   11/28/13 0915  BP: 153/73  Pulse: 92  Temp: 98.4 F (36.9 C)  Resp: 18     Body mass index is 28.21 kg/(m^2).    ECOG FS:1 - Symptomatic but completely ambulatory  Ocular: Sclerae unicteric, pupils equal, round and reactive to light Ear-nose-throat: Oropharynx clear, full upper and lower dentures Lymphatic: No cervical or supraclavicular adenopathy Lungs no rales or rhonchi, good excursion bilaterally Heart regular rate and rhythm, no murmur appreciated Abd soft, moderately obese, nontender, positive bowel sounds MSK no focal spinal tenderness, no upper extremity lymphedema Neuro: non-focal, well-oriented, positive affect Breasts: The right breast is status post mastectomy. Surgical sutures are in place. No chest wall tenderness erythema or nodules or any bleeding or discharge noted. Left breast no masses felt. No bilateral axillary lymphadenopathy appreciated  LAB RESULTS:  CMP     Component Value Date/Time   NA 139 11/28/2013 0906   NA 142 11/16/2013 0847   K 4.5 11/28/2013 0906   K 4.1 11/16/2013 0847   CL 106 11/16/2013 0847   CO2 28 11/28/2013 0906   CO2 27 11/07/2013 0540   GLUCOSE 144* 11/28/2013 0906   GLUCOSE 149* 11/16/2013 0847   BUN 16.8 11/28/2013 0906   BUN 25* 11/16/2013 0847   CREATININE 1.0 11/28/2013 0906   CREATININE 1.20* 11/16/2013 0847   CALCIUM 9.4 11/28/2013 0906   CALCIUM 8.8 11/07/2013 0540   PROT 7.4 11/28/2013 0906   PROT 6.5 10/30/2013 1014   ALBUMIN 3.4* 11/28/2013 0906   ALBUMIN 2.2* 10/30/2013  1014   AST 14 11/28/2013 0906   AST 20 10/30/2013 1014   ALT 10 11/28/2013 0906   ALT 6 10/30/2013 1014   ALKPHOS 92 11/28/2013 0906   ALKPHOS 70 10/30/2013 1014   BILITOT 0.22 11/28/2013 0906   BILITOT <0.2* 10/30/2013 1014   GFRNONAA 37* 11/07/2013 0540   GFRAA 43* 11/07/2013 0540    I No results found for this basename: SPEP,  UPEP,   kappa and lambda light chains    Lab Results  Component Value Date  WBC 10.7* 11/28/2013   NEUTROABS 7.4* 11/28/2013   HGB 9.9 Repeated and Verified* 11/28/2013   HCT 31.1* 11/28/2013   MCV 89.9 11/28/2013   PLT 377 11/28/2013      Chemistry      Component Value Date/Time   NA 139 11/28/2013 0906   NA 142 11/16/2013 0847   K 4.5 11/28/2013 0906   K 4.1 11/16/2013 0847   CL 106 11/16/2013 0847   CO2 28 11/28/2013 0906   CO2 27 11/07/2013 0540   BUN 16.8 11/28/2013 0906   BUN 25* 11/16/2013 0847   CREATININE 1.0 11/28/2013 0906   CREATININE 1.20* 11/16/2013 0847      Component Value Date/Time   CALCIUM 9.4 11/28/2013 0906   CALCIUM 8.8 11/07/2013 0540   ALKPHOS 92 11/28/2013 0906   ALKPHOS 70 10/30/2013 1014   AST 14 11/28/2013 0906   AST 20 10/30/2013 1014   ALT 10 11/28/2013 0906   ALT 6 10/30/2013 1014   BILITOT 0.22 11/28/2013 0906   BILITOT <0.2* 10/30/2013 1014       No results found for this basename: LABCA2    No components found with this basename: LABCA125    No results found for this basename: INR,  in the last 168 hours  Urinalysis    Component Value Date/Time   COLORURINE ORANGE* 10/22/2013 0159   APPEARANCEUR CLOUDY* 10/22/2013 0159   LABSPEC 1.043* 10/22/2013 0159   PHURINE 5.5 10/22/2013 0159   GLUCOSEU >1000* 10/22/2013 0159   HGBUR MODERATE* 10/22/2013 0159   BILIRUBINUR MODERATE* 10/22/2013 0159   KETONESUR 15* 10/22/2013 0159   PROTEINUR 100* 10/22/2013 0159   UROBILINOGEN 2.0* 10/22/2013 0159   NITRITE NEGATIVE 10/22/2013 0159   LEUKOCYTESUR SMALL* 10/22/2013 0159    STUDIES: Dg Chest 2 View  10/22/2013   CLINICAL DATA:  Right breast pain and  swelling.  Fever.  EXAM: CHEST  2 VIEW  COMPARISON:  06/13/2010  FINDINGS: The right breast is asymmetrically enlarged with extensive subcutaneous gas. This creates increased density over the right chest. There is no evidence of pneumonia in the lateral projection. No cardiomegaly. No edema, effusion, or pneumothorax.  Critical Value/emergent results were called by telephone at the time of interpretation on 10/22/2013 at 4:17 AM to Dr. Kathrynn Humble , who verbally acknowledged these results.  IMPRESSION: Right breast swelling with extensive subcutaneous gas. If no recent intervention, findings concerning for necrotizing infection.   Electronically Signed   By: Jorje Guild M.D.   On: 10/22/2013 04:17   Ct Chest W Contrast  10/26/2013   CLINICAL DATA:  58 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 Evaluate for metastatic disease.  EXAM: CT CHEST, ABDOMEN, AND PELVIS WITH CONTRAST  TECHNIQUE: Multidetector CT imaging of the chest, abdomen and pelvis was performed following the standard protocol during bolus administration of intravenous contrast.  CONTRAST:  150m OMNIPAQUE IOHEXOL 300 MG/ML  SOLN  COMPARISON:  CT of abdomen pelvis 06/21/2010.  FINDINGS: CT CHEST FINDINGS  Mediastinum: Heart size is normal. There is no significant pericardial fluid, thickening or pericardial calcification. No pathologically enlarged mediastinal, bilateral hilar or internal mammary lymph nodes. Esophagus is unremarkable in appearance.  Lungs/Pleura: Trace bilateral pleural effusions layering dependently with some mild dependent subsegmental atelectasis in the lower lobes of the lungs bilaterally. No acute consolidative airspace disease. No suspicious appearing pulmonary nodules or masses.  Musculoskeletal: Postoperative changes in the right breast are noted, with a large open wound filled with packing material. The periphery of the open wound  demonstrates a thick rind of soft tissue, some of which appears to enhance. In  the medial aspect of the right breast (image 27 of series 2) there is a 1.5 x 1.3 cm enhancing soft tissue lesion, suspicious for a focus of residual tumor or a malignant lymph node. Numerous borderline enlarged and mildly enlarged right axillary lymph nodes are noted, measuring up to 12 mm in short axis. There are no aggressive appearing lytic or blastic lesions noted in the visualized portions of the skeleton.  CT ABDOMEN AND PELVIS FINDINGS  Abdomen/Pelvis: Several well-defined low-attenuation lesions in the liver appear unchanged in size, number and pattern of distribution compared to prior study 2012, presumably small cysts. The largest of these lesions measures 2.1 cm in segment 4A. No other suspicious hepatic lesions are noted. Status post cholecystectomy. The appearance of the pancreas, spleen, bilateral adrenal glands and bilateral kidneys is unremarkable.  Normal appendix (retrocecal in position). No significant volume of ascites. No pneumoperitoneum. No pathologic distention of small bowel. There are a few colonic diverticulae, particularly in the region of the sigmoid colon, without surrounding inflammatory changes to suggest an acute diverticulitis at this time. Several small ventral hernias are noted, containing several loops of small bowel and a short segment of the mid transverse colon, without evidence of bowel incarceration or obstruction at this time. No lymphadenopathy identified within the abdomen or pelvis. Status post hysterectomy. Ovaries are not confidently identified may be surgically absent or atrophic.  Musculoskeletal: There are no aggressive appearing lytic or blastic lesions noted in the visualized portions of the skeleton. Edema throughout the fat of the right flank, presumably reactive to the process in the right breast.  IMPRESSION: 1. Postoperative changes of recent right breast surgery with packed open wound which has a thick rind of soft tissue, some of which enhances, a 1.3 x  1.5 cm enhancing lesion in the medial aspect of the right breast which may represent residual tumor or a malignant lymph node, and multiple enlarged right axillary nodes concerning for axillary nodal metastases. 2. No other definite signs of metastatic disease in the chest, abdomen or pelvis. 3. Multiple small ventral hernias containing several loops of small bowel, as well as the mid transverse colon, without associated bowel incarceration or obstruction at this time. 4. Trace bilateral pleural effusions with mild dependent subsegmental atelectasis in the lower lobes of the lungs bilaterally. 5. Status post cholecystectomy and hysterectomy. 6. Additional incidental findings, as above.   Electronically Signed   By: Vinnie Langton M.D.   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 Evaluate for metastatic disease.  EXAM: CT CHEST, ABDOMEN, AND PELVIS WITH CONTRAST  TECHNIQUE: Multidetector CT imaging of the chest, abdomen and pelvis was performed following the standard protocol during bolus administration of intravenous contrast.  CONTRAST:  169m OMNIPAQUE IOHEXOL 300 MG/ML  SOLN  COMPARISON:  CT of abdomen pelvis 06/21/2010.  FINDINGS: CT CHEST FINDINGS  Mediastinum: Heart size is normal. There is no significant pericardial fluid, thickening or pericardial calcification. No pathologically enlarged mediastinal, bilateral hilar or internal mammary lymph nodes. Esophagus is unremarkable in appearance.  Lungs/Pleura: Trace bilateral pleural effusions layering dependently with some mild dependent subsegmental atelectasis in the lower lobes of the lungs bilaterally. No acute consolidative airspace disease. No suspicious appearing pulmonary nodules or masses.  Musculoskeletal: Postoperative changes in the right breast are noted, with a large open wound  filled with packing material. The periphery of the open wound  demonstrates a thick rind of soft tissue, some of which appears to enhance. In the medial aspect of the right breast (image 27 of series 2) there is a 1.5 x 1.3 cm enhancing soft tissue lesion, suspicious for a focus of residual tumor or a malignant lymph node. Numerous borderline enlarged and mildly enlarged right axillary lymph nodes are noted, measuring up to 12 mm in short axis. There are no aggressive appearing lytic or blastic lesions noted in the visualized portions of the skeleton.  CT ABDOMEN AND PELVIS FINDINGS  Abdomen/Pelvis: Several well-defined low-attenuation lesions in the liver appear unchanged in size, number and pattern of distribution compared to prior study 2012, presumably small cysts. The largest of these lesions measures 2.1 cm in segment 4A. No other suspicious hepatic lesions are noted. Status post cholecystectomy. The appearance of the pancreas, spleen, bilateral adrenal glands and bilateral kidneys is unremarkable.  Normal appendix (retrocecal in position). No significant volume of ascites. No pneumoperitoneum. No pathologic distention of small bowel. There are a few colonic diverticulae, particularly in the region of the sigmoid colon, without surrounding inflammatory changes to suggest an acute diverticulitis at this time. Several small ventral hernias are noted, containing several loops of small bowel and a short segment of the mid transverse colon, without evidence of bowel incarceration or obstruction at this time. No lymphadenopathy identified within the abdomen or pelvis. Status post hysterectomy. Ovaries are not confidently identified may be surgically absent or atrophic.  Musculoskeletal: There are no aggressive appearing lytic or blastic lesions noted in the visualized portions of the skeleton. Edema throughout the fat of the right flank, presumably reactive to the process in the right breast.  IMPRESSION: 1. Postoperative changes of recent right breast surgery with packed open  wound which has a thick rind of soft tissue, some of which enhances, a 1.3 x 1.5 cm enhancing lesion in the medial aspect of the right breast which may represent residual tumor or a malignant lymph node, and multiple enlarged right axillary nodes concerning for axillary nodal metastases. 2. No other definite signs of metastatic disease in the chest, abdomen or pelvis. 3. Multiple small ventral hernias containing several loops of small bowel, as well as the mid transverse colon, without associated bowel incarceration or obstruction at this time. 4. Trace bilateral pleural effusions with mild dependent subsegmental atelectasis in the lower lobes of the lungs bilaterally. 5. Status post cholecystectomy and hysterectomy. 6. Additional incidental findings, as above.   Electronically Signed   By: Vinnie Langton M.D.   On: 10/26/2013 08:35   Dg Chest Portable 1 View  11/16/2013   CLINICAL DATA:  Central catheter placement  EXAM: PORTABLE CHEST - 1 VIEW  COMPARISON:  Chest radiograph Oct 22, 2013 and chest CT Oct 25, 2013  FINDINGS: Port-A-Cath tip is in the superior vena cava. No pneumothorax. There is mild left base atelectasis. Elsewhere lungs are clear. Heart size and pulmonary vascularity are normal. No adenopathy. There is postoperative change on the right with clips in the right axilla.  IMPRESSION: Port-A-Cath tip in superior vena cava. No pneumothorax. Mild left base atelectasis. Lungs otherwise clear.   Electronically Signed   By: Lowella Grip M.D.   On: 11/16/2013 13:58   Dg Fluoro Guide Cv Line-no Report  11/16/2013   CLINICAL DATA: PAC   FLOURO GUIDE CV LINE  Fluoroscopy was utilized by the requesting physician.  No radiographic  interpretation.  ASSESSMENT: 58 y.o. [place] woman  #1. stage IIIA  (pT3 pN1) invasive ductal carcinoma, grade 3, triple negative, with an MIB-1 of 95%.status post right modified radical mastectomy 10/26/2013  #2.  chemotherapy with dose dense Adriamycin and  cyclophosphamide x 4 cycles, start date: 11/28/2013  #3. F ollowed by carboplatin and paclitaxel weekly x12.  #4  postmastectomy radiation to follow after completion of chemotherapy.    PLAN: During the last visit Dr. Jana Hakim had a detailed discussion of chemotherapy sessions and planning with the patient. Nausea and vomiting medications were prescribed. I have reviewed her CBC and differential and CMP and  those are acceptable for treatment today I have once again reiterated the benefits and side effects of chemotherapy. She understood the same and would like to proceed with the treatment today We'll initiate first cycle of chemotherapy with dose dense doxorubicin and cyclophosphamide today followed by Neulasta therapy tomorrow Patient was instructed to record her blood sugars before each meal beginning on the day of treatment and continuing for the next 2 days, and call the clinic if that number rises above 250.  She was prescribed 30  oxycodone tablets, during the last visit if needed for the Neulasta associated bony pain.  Judithann is in agreement with the current plan of care.  She will call with any problems that may develop before her next visit here. Next followup visit in 1 week for assessment of chemotoxicities with CBC and differential on the day of next visit  Wilmon Arms, MD  Medical oncology   11/28/2013 9:45 AM

## 2013-11-29 ENCOUNTER — Ambulatory Visit (HOSPITAL_BASED_OUTPATIENT_CLINIC_OR_DEPARTMENT_OTHER): Payer: BC Managed Care – PPO

## 2013-11-29 ENCOUNTER — Telehealth: Payer: Self-pay | Admitting: *Deleted

## 2013-11-29 VITALS — BP 127/51 | HR 75 | Temp 98.4°F

## 2013-11-29 DIAGNOSIS — Z5189 Encounter for other specified aftercare: Secondary | ICD-10-CM

## 2013-11-29 DIAGNOSIS — C50919 Malignant neoplasm of unspecified site of unspecified female breast: Secondary | ICD-10-CM

## 2013-11-29 MED ORDER — PEGFILGRASTIM INJECTION 6 MG/0.6ML
6.0000 mg | Freq: Once | SUBCUTANEOUS | Status: AC
  Administered 2013-11-29: 6 mg via SUBCUTANEOUS
  Filled 2013-11-29: qty 0.6

## 2013-11-29 NOTE — Telephone Encounter (Signed)
Carmen Stone here for Neulasta injection following 1st a/c chemo treatment.  States that she is doing good.  No nausea, vomiting or diarrhea.  Is drinking and eating well.  All questions answered.  Knows to call if she has any problems or concerns.

## 2013-11-29 NOTE — Patient Instructions (Signed)

## 2013-11-29 NOTE — Telephone Encounter (Signed)
Called pt again to offer navigation resources.  Pt mail box is full. Will call again to see if I can reach her.

## 2013-12-02 ENCOUNTER — Other Ambulatory Visit: Payer: Self-pay | Admitting: *Deleted

## 2013-12-02 MED ORDER — ONDANSETRON HCL 8 MG PO TABS: 8.0000 mg | ORAL_TABLET | Freq: Three times a day (TID) | ORAL | Status: DC | PRN

## 2013-12-02 NOTE — Telephone Encounter (Signed)
Pt left message yesterday stating need to obtain refill on pain medication.  Pt states she obtained a prescription last week on 6/9 " it was for 30 tablets with instructions to take 1 every 4 hours and I have been needing them that often"  Return call number given as [phone].  This RN returned call and received message stating pt's " VM is full and cannot receive further messages at this time "  This RN called pt 's pharmacy and inquired about most recent refill - Informed by pharmacy that pt obtained percocet 10/325 tablets # 20 per Dr [name] on 12/01/2013.

## 2013-12-05 ENCOUNTER — Other Ambulatory Visit: Payer: Self-pay | Admitting: Physician Assistant

## 2013-12-05 ENCOUNTER — Encounter: Payer: Self-pay | Admitting: Adult Health

## 2013-12-05 ENCOUNTER — Ambulatory Visit (HOSPITAL_BASED_OUTPATIENT_CLINIC_OR_DEPARTMENT_OTHER): Payer: BC Managed Care – PPO | Admitting: Adult Health

## 2013-12-05 ENCOUNTER — Other Ambulatory Visit (HOSPITAL_BASED_OUTPATIENT_CLINIC_OR_DEPARTMENT_OTHER): Payer: BC Managed Care – PPO

## 2013-12-05 ENCOUNTER — Encounter: Payer: Self-pay | Admitting: Oncology

## 2013-12-05 VITALS — BP 118/66 | HR 102 | Temp 98.4°F | Resp 20 | Ht 64.0 in | Wt 151.9 lb

## 2013-12-05 DIAGNOSIS — C50919 Malignant neoplasm of unspecified site of unspecified female breast: Secondary | ICD-10-CM

## 2013-12-05 DIAGNOSIS — N611 Abscess of the breast and nipple: Secondary | ICD-10-CM

## 2013-12-05 DIAGNOSIS — R0902 Hypoxemia: Secondary | ICD-10-CM

## 2013-12-05 DIAGNOSIS — R197 Diarrhea, unspecified: Secondary | ICD-10-CM

## 2013-12-05 DIAGNOSIS — C773 Secondary and unspecified malignant neoplasm of axilla and upper limb lymph nodes: Secondary | ICD-10-CM

## 2013-12-05 DIAGNOSIS — E119 Type 2 diabetes mellitus without complications: Secondary | ICD-10-CM

## 2013-12-05 LAB — CBC WITH DIFFERENTIAL/PLATELET
BASO%: 0.3 % (ref 0.0–2.0)
Basophils Absolute: 0 10*3/uL (ref 0.0–0.1)
EOS ABS: 0.4 10*3/uL (ref 0.0–0.5)
EOS%: 4.5 % (ref 0.0–7.0)
HCT: 31.4 % — ABNORMAL LOW (ref 34.8–46.6)
HGB: 10.3 g/dL — ABNORMAL LOW (ref 11.6–15.9)
LYMPH%: 12.1 % — AB (ref 14.0–49.7)
MCH: 29.2 pg (ref 25.1–34.0)
MCHC: 32.8 g/dL (ref 31.5–36.0)
MCV: 89 fL (ref 79.5–101.0)
MONO#: 0.3 10*3/uL (ref 0.1–0.9)
MONO%: 4.1 % (ref 0.0–14.0)
NEUT%: 79 % — ABNORMAL HIGH (ref 38.4–76.8)
NEUTROS ABS: 6.2 10*3/uL (ref 1.5–6.5)
PLATELETS: 247 10*3/uL (ref 145–400)
RBC: 3.53 10*6/uL — AB (ref 3.70–5.45)
RDW: 14.5 % (ref 11.2–14.5)
WBC: 7.9 10*3/uL (ref 3.9–10.3)
lymph#: 1 10*3/uL (ref 0.9–3.3)

## 2013-12-05 LAB — COMPREHENSIVE METABOLIC PANEL (CC13)
ALBUMIN: 3.6 g/dL (ref 3.5–5.0)
ALT: 8 U/L (ref 0–55)
AST: 11 U/L (ref 5–34)
Alkaline Phosphatase: 98 U/L (ref 40–150)
Anion Gap: 10 mEq/L (ref 3–11)
BUN: 16 mg/dL (ref 7.0–26.0)
CO2: 26 mEq/L (ref 22–29)
Calcium: 9.5 mg/dL (ref 8.4–10.4)
Chloride: 100 mEq/L (ref 98–109)
Creatinine: 0.8 mg/dL (ref 0.6–1.1)
Glucose: 195 mg/dl — ABNORMAL HIGH (ref 70–140)
POTASSIUM: 3.9 meq/L (ref 3.5–5.1)
SODIUM: 136 meq/L (ref 136–145)
TOTAL PROTEIN: 7.4 g/dL (ref 6.4–8.3)
Total Bilirubin: 0.32 mg/dL (ref 0.20–1.20)

## 2013-12-05 MED ORDER — TRAMADOL HCL 50 MG PO TABS: 50.0000 mg | ORAL_TABLET | Freq: Four times a day (QID) | ORAL | Status: DC

## 2013-12-05 NOTE — Progress Notes (Signed)
Put disability form in registration desk

## 2013-12-05 NOTE — Patient Instructions (Signed)
Loperamide tablets or capsules What is this medicine? LOPERAMIDE (loe PER a mide) is used to treat diarrhea. This medicine may be used for other purposes; ask your health care provider or pharmacist if you have questions. COMMON BRAND NAME(S): Anti-Diarrheal, Imodium A-D, K-Pek II What should I tell my health care provider before I take this medicine? They need to know if you have any of these conditions: -a black or bloody stool -bacterial food poisoning -colitis or mucus in your stool -currently taking an antibiotic medication for an infection -fever -liver disease -severe abdominal pain, swelling or bulging -an unusual or allergic reaction to loperamide, other medicines, foods, dyes, or preservatives -pregnant or trying to get pregnant -breast-feeding How should I use this medicine? Take this medicine by mouth with a glass of water. Follow the directions on the prescription label. Take your doses at regular intervals. Do not take your medicine more often than directed. Talk to your pediatrician regarding the use of this medicine in children. Special care may be needed. Overdosage: If you think you have taken too much of this medicine contact a poison control center or emergency room at once. NOTE: This medicine is only for you. Do not share this medicine with others. What if I miss a dose? This does not apply. This medicine is not for regular use. Only take this medicine while you continue to have loose bowel movements. Do not take more medicine than recommended by the packaging label or by your healthcare professional. What may interact with this medicine? Do not take this medicine with any of the following medications: -alosetron This medicine may also interact with the following medications: -quinidine -ritonavir -saquinavir This list may not describe all possible interactions. Give your health care provider a list of all the medicines, herbs, non-prescription drugs, or dietary  supplements you use. Also tell them if you smoke, drink alcohol, or use illegal drugs. Some items may interact with your medicine. What should I watch for while using this medicine? Do not take this medicine for more than 1 week without asking your doctor or health care professional. If your symptoms do not start to get better after two days, you may have a problem that needs further evaluation. Check with your doctor or health care professional right away if you develop a fever, severe abdominal pain, swelling or bulging, or if you have have bloody/black diarrhea or stools. You may get drowsy or dizzy. Do not drive, use machinery, or do anything that needs mental alertness until you know how this medicine affects you. Do not stand or sit up quickly, especially if you are an older patient. This reduces the risk of dizzy or fainting spells. Alcohol can increase possible drowsiness and dizziness. Avoid alcoholic drinks. Your mouth may get dry. Chewing sugarless gum or sucking hard candy, and drinking plenty of water may help. Contact your doctor if the problem does not go away or is severe. Drinking plenty of water can also help prevent dehydration that can occur with diarrhea. Elderly patients may have a more variable response to the effects of this medicine, and are more susceptible to the effects of dehydration. What side effects may I notice from receiving this medicine? Side effects that you should report to your doctor or health care professional as soon as possible: -allergic reactions like skin rash, itching or hives, swelling of the face, lips, or tongue -bloated, swollen feeling in your abdomen -blurred vision -loss of appetite -stomach pain Side effects that usually do not  require medical attention (report to your doctor or health care professional if they continue or are bothersome): -constipation -drowsiness or dizziness -dry mouth -nausea, vomiting This list may not describe all  possible side effects. Call your doctor for medical advice about side effects. You may report side effects to FDA at 1-800-FDA-1088. Where should I keep my medicine? Keep out of the reach of children. Store at room temperature between 15 and 25 degrees C (59 and 77 degrees F). Keep container tightly closed. Throw away any unused medicine after the expiration date. NOTE: This sheet is a summary. It may not cover all possible information. If you have questions about this medicine, talk to your doctor, pharmacist, or health care provider.  2015, [employer]. (2007-12-07 16:02:13)  Take one tablet today.  Please call us if you have any questions or concerns.

## 2013-12-05 NOTE — Progress Notes (Signed)
[place]  [phone] Fax:(336) [phone]     ID: [name] OB: [birth year]  MR#: [id number]  XTG#:[id number]  PCP: [name], MD GYN:   SU: [name] MD OTHER MD:   CHIEF COMPLAINT: 58 y/o woman with inflammatory breast cancer undergoing adjuvant chemotherapy  BREAST CANCER HISTORY: As per previously dictated Dr.Magrinat's note:  Ms. Ruben presented to the emergency room on 10/22/2013 complaining of right breast pain and discharge. On exam the breast was diffusely erythematous with" orange peel" appearance. She was admitted for debridement and surgery he describes a skin defect in the lower inner quadrant measuring approximately 4 cm. When the breast was breast, air came out through the hole--there are photos included with the admission note. A chest x-ray was obtained which showed the right breast to be asymmetrically enlarged with extensive subcutaneous gas.  On the same day the patient underwent debridement of the wound, with the pathology (SZA [id number]) showing high-grade carcinoma involving ulcerated skin. There was tumor within the dermal and subcutaneous lymphatics. The tumor was negative for estrogen, progesterone, and HER-2(signals ratio 1.24, number per cell 3.6), with an MIB-1 of 95%. Gross cystic disease fluid protein was also negative.  The patient was admitted and started on antibiotics. On 10/24/2013 an attempt was made at further debridement, but the inside of the breast essentially came out in hand falls. The pathology from this procedure(SZA 15-20 and 26) again showed high-grade carcinoma.  On 10/25/2013 the patient underwent CT scans of the chest abdomen and pelvis. Aside from disease in the breast and right axilla there was no evidence of metastatic disease.  Accordingly Dr. [name] proceeded to right mastectomy with axillary lymph node dissection 10/26/2013. Results of this procedure ([id number]) showed a 15  cm defect in the middle of the breast, which is 4 prior necrotic tissue had been scoped out. There was nevertheless extensive invasive ductal carcinoma, grade 3, in the specimen and repeat or blastic panel was again triple negative. 2 of 16 axillary lymph nodes were involved by macrometastatic deposits. The patient was continued on antibiotics and eventually discharged 11/03/2013.  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  This is given on day 1 of a 14 day cycle, with Neulasta given on day 2 for granulocyte support.  She is currently cycle 1 day 8 of therapy.  Sheril tolerated chemotherapy relatively well.  She had one episode of loose, watery stool this morning.  Otherwise, she denies fevers, chills, nausea, vomiting, constipation, numbness/tingling, taste/appetite changes, or any further concerns.   REVIEW OF SYSTEMS: A 10 point review of systems is been assessed and pertinent symptoms as mentioned in interval history   PAST MEDICAL HISTORY: Past Medical History  Diagnosis Date  . Diabetes mellitus, type II   . Inflammatory breast cancer     PAST SURGICAL HISTORY: Past Surgical History  Procedure Laterality Date  . Cesarean section    . Ventral hernia repair  2011    repair incacerated VH with biologic mesh; cholecystectomy  . Cholecystectomy  2011  . Incision and drainage of wound Right 10/22/2013    Procedure: IRRIGATION AND DRAINAGE AND DEBRIDEMENT RIGHT BREAST WOUND;  Surgeon: [name], MD;  Location: [facility];  Service: General;  Laterality: Right;  . Breast  biopsy Right 10/22/2013    Procedure: BREAST BIOPSY;  Surgeon: [name], MD;  Location: [facility];  Service: General;  Laterality: Right;  . Irrigation and debridement abscess Right  10/24/2013    Procedure: Right total mastectomy;  Surgeon: [name], MD;  Location: [facility];  Service: General;  Laterality: Right;  . Dressing change under anesthesia Right 10/26/2013    Procedure: COMPLETION MASTECTOMY, AXILLARY DISSECTION;  Surgeon: [name], MD;  Location: [facility];  Service: General;  Laterality: Right;  . Portacath placement N/A 11/16/2013    Procedure: INSERTION PORT-A-CATH;  Surgeon: [name], MD;  Location: [facility];  Service: General;  Laterality: N/A;    FAMILY HISTORY No family history on file. The patient's father was a heavy smoker and died from complications of emphysema at the age of 23. The patient's mother died from lung cancer at the age of 75. She also was a heavy smoker. The patient had one brother, no sisters. There is no history of breast or ovarian cancer in the family.  GYNECOLOGIC HISTORY:  Menarche age 58, first live birth age 72, the patient is GX P1. She went through the change of life approximately age 69. She did not take hormone replacement.  SOCIAL HISTORY:  Josaphine is divorced. She works for [employer] and also works a second job at [employer] on [employer]. She plans to be on disability during her chemoradiation treatments. She lives alone, but her daughter [name] frequently stays there overnight. [name] was [employer]. 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 She spends her time between helping her mom and helping her grandfather, Chynna Buerkle, who lives in [place] and has significant dementia. Mr. Kinnaird was present at the 11/18/2013 visit only because [name] wanted to be present and otherwise no one would have been with Mr. Mair. Aleayah has no grandchildren. She attends a [employer] church    ADVANCED DIRECTIVES: In place. The patient has named her brother Clide Cliff as her healthcare power of attorney. Iona Beard can be reached at [phone]. He is a Pharmacist, hospital  locally  HEALTH MAINTENANCE: History  Substance Use Topics  . Smoking status: Never Smoker   . Smokeless tobacco: Not on file  . Alcohol Use: No     Colonoscopy:  PAP:  Bone density:  Lipid panel:  No Known Allergies  Current Outpatient Prescriptions  Medication Sig Dispense Refill  . acetaminophen (TYLENOL) 325 MG tablet Take 650 mg by mouth every 6 (six) hours as needed for mild pain.      Marland Kitchen dexamethasone (DECADRON) 4 MG tablet Take 2 tablets by mouth once a day on the day after chemotherapy and then take 2 tablets two times a day for 2 days. Take with food.  30 tablet  1  . glucose blood test strip Use as instructed  100 each  12  . glucose monitoring kit (FREESTYLE) monitoring kit 1 each by Does not apply route as needed for other. Check blood sugars daily.  1 each  0  . Insulin Glargine (LANTUS SOLOSTAR) 100 UNIT/ML Solostar Pen Inject 10 Units into the skin daily at 10 pm.  15 mL  3  . lidocaine-prilocaine (EMLA) cream Apply 1 application topically as needed. Apply over port area 1-2 hours before treatment, cover with plastic wrap  30 g  0  . linagliptin (TRADJENTA) 5 MG TABS tablet Take 1 tablet (5 mg total) by mouth daily.  30 tablet  0  . LORazepam (ATIVAN)  0.5 MG tablet Take 1 tablet (0.5 mg total) by mouth at bedtime as needed (Nausea or vomiting).  30 tablet  0  . ondansetron (ZOFRAN) 8 MG tablet Take 1 tablet (8 mg total) by mouth every 8 (eight) hours as needed for nausea or vomiting.  20 tablet  3  . oxyCODONE-acetaminophen (PERCOCET) 10-325 MG per tablet Take 1 tablet by mouth every 6 (six) hours as needed for pain.  20 tablet  0  . pantoprazole (PROTONIX) 40 MG tablet Take 1 tablet (40 mg total) by mouth daily at 6 (six) AM.  30 tablet  0  . prochlorperazine (COMPAZINE) 10 MG tablet Take 1 tablet (10 mg total) by mouth every 6 (six) hours as needed (Nausea or vomiting).  30 tablet  1  . traMADol (ULTRAM) 50 MG tablet Take 1 tablet (50 mg total) by mouth every 6 (six)  hours.  60 tablet  0   No current facility-administered medications for this visit.    OBJECTIVE: Middle-aged [place] American woman in no acute distress Filed Vitals:   12/05/13 1340  BP: 118/66  Pulse: 102  Temp: 98.4 F (36.9 C)  Resp: 20     Body mass index is 26.06 kg/(m^2).    ECOG FS:1 - Symptomatic but completely ambulatory  GENERAL: Patient is a well appearing female in no acute distress HEENT:  Sclerae anicteric.  Oropharynx clear and moist. No ulcerations or evidence of oropharyngeal candidiasis. Neck is supple.  NODES:  No cervical, supraclavicular, or axillary lymphadenopathy palpated.  BREAST EXAM:  Deferred. LUNGS:  Clear to auscultation bilaterally.  No wheezes or rhonchi. HEART:  Regular rate and rhythm. No murmur appreciated. ABDOMEN:  Soft, nontender.  Positive, normoactive bowel sounds. No organomegaly palpated. MSK:  No focal spinal tenderness to palpation. Full range of motion bilaterally in the upper extremities. EXTREMITIES:  No peripheral edema.   SKIN:  Clear with no obvious rashes or skin changes. No nail dyscrasia. NEURO:  Nonfocal. Well oriented.  Appropriate affect.    LAB RESULTS:  CMP     Component Value Date/Time   NA 139 11/28/2013 0906   NA 142 11/16/2013 0847   K 4.5 11/28/2013 0906   K 4.1 11/16/2013 0847   CL 106 11/16/2013 0847   CO2 28 11/28/2013 0906   CO2 27 11/07/2013 0540   GLUCOSE 144* 11/28/2013 0906   GLUCOSE 149* 11/16/2013 0847   BUN 16.8 11/28/2013 0906   BUN 25* 11/16/2013 0847   CREATININE 1.0 11/28/2013 0906   CREATININE 1.20* 11/16/2013 0847   CALCIUM 9.4 11/28/2013 0906   CALCIUM 8.8 11/07/2013 0540   PROT 7.4 11/28/2013 0906   PROT 6.5 10/30/2013 1014   ALBUMIN 3.4* 11/28/2013 0906   ALBUMIN 2.2* 10/30/2013 1014   AST 14 11/28/2013 0906   AST 20 10/30/2013 1014   ALT 10 11/28/2013 0906   ALT 6 10/30/2013 1014   ALKPHOS 92 11/28/2013 0906   ALKPHOS 70 10/30/2013 1014   BILITOT 0.22 11/28/2013 0906   BILITOT <0.2* 10/30/2013 1014    GFRNONAA 37* 11/07/2013 0540   GFRAA 43* 11/07/2013 0540    I No results found for this basename: SPEP,  UPEP,   kappa and lambda light chains    Lab Results  Component Value Date   WBC 7.9 12/05/2013   NEUTROABS 6.2 12/05/2013   HGB 10.3* 12/05/2013   HCT 31.4* 12/05/2013   MCV 89.0 12/05/2013   PLT 247 12/05/2013      Chemistry  Component Value Date/Time   NA 139 11/28/2013 0906   NA 142 11/16/2013 0847   K 4.5 11/28/2013 0906   K 4.1 11/16/2013 0847   CL 106 11/16/2013 0847   CO2 28 11/28/2013 0906   CO2 27 11/07/2013 0540   BUN 16.8 11/28/2013 0906   BUN 25* 11/16/2013 0847   CREATININE 1.0 11/28/2013 0906   CREATININE 1.20* 11/16/2013 0847      Component Value Date/Time   CALCIUM 9.4 11/28/2013 0906   CALCIUM 8.8 11/07/2013 0540   ALKPHOS 92 11/28/2013 0906   ALKPHOS 70 10/30/2013 1014   AST 14 11/28/2013 0906   AST 20 10/30/2013 1014   ALT 10 11/28/2013 0906   ALT 6 10/30/2013 1014   BILITOT 0.22 11/28/2013 0906   BILITOT <0.2* 10/30/2013 1014       No results found for this basename: LABCA2    No components found with this basename: LABCA125    No results found for this basename: INR,  in the last 168 hours  Urinalysis    Component Value Date/Time   COLORURINE ORANGE* 10/22/2013 0159   APPEARANCEUR CLOUDY* 10/22/2013 0159   LABSPEC 1.043* 10/22/2013 0159   PHURINE 5.5 10/22/2013 0159   GLUCOSEU >1000* 10/22/2013 0159   HGBUR MODERATE* 10/22/2013 0159   BILIRUBINUR MODERATE* 10/22/2013 0159   KETONESUR 15* 10/22/2013 0159   PROTEINUR 100* 10/22/2013 0159   UROBILINOGEN 2.0* 10/22/2013 0159   NITRITE NEGATIVE 10/22/2013 0159   LEUKOCYTESUR SMALL* 10/22/2013 0159    STUDIES: Dg Chest 2 View  10/22/2013   CLINICAL DATA:  Right breast pain and swelling.  Fever.  EXAM: CHEST  2 VIEW  COMPARISON:  06/13/2010  FINDINGS: The right breast is asymmetrically enlarged with extensive subcutaneous gas. This creates increased density over the right chest. There is no evidence of pneumonia in the lateral  projection. No cardiomegaly. No edema, effusion, or pneumothorax.  Critical Value/emergent results were called by telephone at the time of interpretation on 10/22/2013 at 4:17 AM to Dr. Kathrynn Humble , who verbally acknowledged these results.  IMPRESSION: Right breast swelling with extensive subcutaneous gas. If no recent intervention, findings concerning for necrotizing infection.   Electronically Signed   By: Jorje Guild M.D.   On: 10/22/2013 04:17   Ct Chest W Contrast  10/26/2013   CLINICAL DATA:  58 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 Evaluate for metastatic disease.  EXAM: CT CHEST, ABDOMEN, AND PELVIS WITH CONTRAST  TECHNIQUE: Multidetector CT imaging of the chest, abdomen and pelvis was performed following the standard protocol during bolus administration of intravenous contrast.  CONTRAST:  164m OMNIPAQUE IOHEXOL 300 MG/ML  SOLN  COMPARISON:  CT of abdomen pelvis 06/21/2010.  FINDINGS: CT CHEST FINDINGS  Mediastinum: Heart size is normal. There is no significant pericardial fluid, thickening or pericardial calcification. No pathologically enlarged mediastinal, bilateral hilar or internal mammary lymph nodes. Esophagus is unremarkable in appearance.  Lungs/Pleura: Trace bilateral pleural effusions layering dependently with some mild dependent subsegmental atelectasis in the lower lobes of the lungs bilaterally. No acute consolidative airspace disease. No suspicious appearing pulmonary nodules or masses.  Musculoskeletal: Postoperative changes in the right breast are noted, with a large open wound filled with packing material. The periphery of the open wound demonstrates a thick rind of soft tissue, some of which appears to enhance. In the medial aspect of the right breast (image 27 of series 2) there is a 1.5 x 1.3 cm enhancing soft tissue lesion, suspicious for a focus of  residual tumor or a malignant lymph node. Numerous borderline enlarged and mildly enlarged right axillary lymph  nodes are noted, measuring up to 12 mm in short axis. There are no aggressive appearing lytic or blastic lesions noted in the visualized portions of the skeleton.  CT ABDOMEN AND PELVIS FINDINGS  Abdomen/Pelvis: Several well-defined low-attenuation lesions in the liver appear unchanged in size, number and pattern of distribution compared to prior study 2012, presumably small cysts. The largest of these lesions measures 2.1 cm in segment 4A. No other suspicious hepatic lesions are noted. Status post cholecystectomy. The appearance of the pancreas, spleen, bilateral adrenal glands and bilateral kidneys is unremarkable.  Normal appendix (retrocecal in position). No significant volume of ascites. No pneumoperitoneum. No pathologic distention of small bowel. There are a few colonic diverticulae, particularly in the region of the sigmoid colon, without surrounding inflammatory changes to suggest an acute diverticulitis at this time. Several small ventral hernias are noted, containing several loops of small bowel and a short segment of the mid transverse colon, without evidence of bowel incarceration or obstruction at this time. No lymphadenopathy identified within the abdomen or pelvis. Status post hysterectomy. Ovaries are not confidently identified may be surgically absent or atrophic.  Musculoskeletal: There are no aggressive appearing lytic or blastic lesions noted in the visualized portions of the skeleton. Edema throughout the fat of the right flank, presumably reactive to the process in the right breast.  IMPRESSION: 1. Postoperative changes of recent right breast surgery with packed open wound which has a thick rind of soft tissue, some of which enhances, a 1.3 x 1.5 cm enhancing lesion in the medial aspect of the right breast which may represent residual tumor or a malignant lymph node, and multiple enlarged right axillary nodes concerning for axillary nodal metastases. 2. No other definite signs of metastatic  disease in the chest, abdomen or pelvis. 3. Multiple small ventral hernias containing several loops of small bowel, as well as the mid transverse colon, without associated bowel incarceration or obstruction at this time. 4. Trace bilateral pleural effusions with mild dependent subsegmental atelectasis in the lower lobes of the lungs bilaterally. 5. Status post cholecystectomy and hysterectomy. 6. Additional incidental findings, as above.   Electronically Signed   By: Vinnie Langton M.D.   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 Evaluate for metastatic disease.  EXAM: CT CHEST, ABDOMEN, AND PELVIS WITH CONTRAST  TECHNIQUE: Multidetector CT imaging of the chest, abdomen and pelvis was performed following the standard protocol during bolus administration of intravenous contrast.  CONTRAST:  113m OMNIPAQUE IOHEXOL 300 MG/ML  SOLN  COMPARISON:  CT of abdomen pelvis 06/21/2010.  FINDINGS: CT CHEST FINDINGS  Mediastinum: Heart size is normal. There is no significant pericardial fluid, thickening or pericardial calcification. No pathologically enlarged mediastinal, bilateral hilar or internal mammary lymph nodes. Esophagus is unremarkable in appearance.  Lungs/Pleura: Trace bilateral pleural effusions layering dependently with some mild dependent subsegmental atelectasis in the lower lobes of the lungs bilaterally. No acute consolidative airspace disease. No suspicious appearing pulmonary nodules or masses.  Musculoskeletal: Postoperative changes in the right breast are noted, with a large open wound filled with packing material. The periphery of the open wound demonstrates a thick rind of soft tissue, some of which appears to enhance. In the medial aspect of the right breast (image 27 of series 2) there is a 1.5 x 1.3  cm enhancing soft tissue lesion, suspicious for a focus of residual tumor or a malignant lymph  node. Numerous borderline enlarged and mildly enlarged right axillary lymph nodes are noted, measuring up to 12 mm in short axis. There are no aggressive appearing lytic or blastic lesions noted in the visualized portions of the skeleton.  CT ABDOMEN AND PELVIS FINDINGS  Abdomen/Pelvis: Several well-defined low-attenuation lesions in the liver appear unchanged in size, number and pattern of distribution compared to prior study 2012, presumably small cysts. The largest of these lesions measures 2.1 cm in segment 4A. No other suspicious hepatic lesions are noted. Status post cholecystectomy. The appearance of the pancreas, spleen, bilateral adrenal glands and bilateral kidneys is unremarkable.  Normal appendix (retrocecal in position). No significant volume of ascites. No pneumoperitoneum. No pathologic distention of small bowel. There are a few colonic diverticulae, particularly in the region of the sigmoid colon, without surrounding inflammatory changes to suggest an acute diverticulitis at this time. Several small ventral hernias are noted, containing several loops of small bowel and a short segment of the mid transverse colon, without evidence of bowel incarceration or obstruction at this time. No lymphadenopathy identified within the abdomen or pelvis. Status post hysterectomy. Ovaries are not confidently identified may be surgically absent or atrophic.  Musculoskeletal: There are no aggressive appearing lytic or blastic lesions noted in the visualized portions of the skeleton. Edema throughout the fat of the right flank, presumably reactive to the process in the right breast.  IMPRESSION: 1. Postoperative changes of recent right breast surgery with packed open wound which has a thick rind of soft tissue, some of which enhances, a 1.3 x 1.5 cm enhancing lesion in the medial aspect of the right breast which may represent residual tumor or a malignant lymph node, and multiple enlarged right axillary nodes  concerning for axillary nodal metastases. 2. No other definite signs of metastatic disease in the chest, abdomen or pelvis. 3. Multiple small ventral hernias containing several loops of small bowel, as well as the mid transverse colon, without associated bowel incarceration or obstruction at this time. 4. Trace bilateral pleural effusions with mild dependent subsegmental atelectasis in the lower lobes of the lungs bilaterally. 5. Status post cholecystectomy and hysterectomy. 6. Additional incidental findings, as above.   Electronically Signed   By: Vinnie Langton M.D.   On: 10/26/2013 08:35   Dg Chest Portable 1 View  11/16/2013   CLINICAL DATA:  Central catheter placement  EXAM: PORTABLE CHEST - 1 VIEW  COMPARISON:  Chest radiograph Oct 22, 2013 and chest CT Oct 25, 2013  FINDINGS: Port-A-Cath tip is in the superior vena cava. No pneumothorax. There is mild left base atelectasis. Elsewhere lungs are clear. Heart size and pulmonary vascularity are normal. No adenopathy. There is postoperative change on the right with clips in the right axilla.  IMPRESSION: Port-A-Cath tip in superior vena cava. No pneumothorax. Mild left base atelectasis. Lungs otherwise clear.   Electronically Signed   By: Lowella Grip M.D.   On: 11/16/2013 13:58   Dg Fluoro Guide Cv Line-no Report  11/16/2013   CLINICAL DATA: PAC   FLOURO GUIDE CV LINE  Fluoroscopy was utilized by the requesting physician.  No radiographic  interpretation.     ASSESSMENT: 58 y.o. [place] woman  #1. stage IIIA  (pT3 pN1) invasive ductal carcinoma, grade 3, triple negative, with an MIB-1 of 95%.status post right modified radical mastectomy 10/26/2013  #2.  chemotherapy with dose dense Adriamycin and cyclophosphamide x  4 cycles, start date: 11/28/2013  #3. Followed by carboplatin and paclitaxel weekly x12.  #4  postmastectomy radiation to follow after completion of chemotherapy.    PLAN:  Beatryce is doing well today.  Her labs are  stable.  I reviewed them with her in detail.  She has tolerated her first cycle of chemotherapy well.    She has had one episode of diarrhea.  I did tell her that she can take imodium if needed for increased diarrhea.    I gave her instructions about this in her AVS.    The patient will return in one week for labs, evaluation, and cycle 2 of Doxorubicin and Cyclophosphamide.   She knows to call us in the interim for any questions or concerns.  We can certainly see her sooner if needed.  I spent 15 minutes counseling the patient face to face.  The total time spent in the appointment was 30 minutes.   Minette Headland, [place] [phone] 12/05/2013 1:46 PM

## 2013-12-07 ENCOUNTER — Other Ambulatory Visit: Payer: Self-pay | Admitting: Physician Assistant

## 2013-12-10 ENCOUNTER — Other Ambulatory Visit: Payer: Self-pay | Admitting: Oncology

## 2013-12-12 ENCOUNTER — Ambulatory Visit (HOSPITAL_BASED_OUTPATIENT_CLINIC_OR_DEPARTMENT_OTHER): Payer: BC Managed Care – PPO

## 2013-12-12 ENCOUNTER — Encounter: Payer: Self-pay | Admitting: Oncology

## 2013-12-12 ENCOUNTER — Other Ambulatory Visit (HOSPITAL_BASED_OUTPATIENT_CLINIC_OR_DEPARTMENT_OTHER): Payer: BC Managed Care – PPO

## 2013-12-12 ENCOUNTER — Ambulatory Visit (HOSPITAL_BASED_OUTPATIENT_CLINIC_OR_DEPARTMENT_OTHER): Payer: BC Managed Care – PPO | Admitting: Oncology

## 2013-12-12 VITALS — BP 143/67 | HR 77 | Temp 98.1°F | Resp 18 | Ht 64.0 in | Wt 167.9 lb

## 2013-12-12 DIAGNOSIS — C50919 Malignant neoplasm of unspecified site of unspecified female breast: Secondary | ICD-10-CM

## 2013-12-12 DIAGNOSIS — E119 Type 2 diabetes mellitus without complications: Secondary | ICD-10-CM

## 2013-12-12 DIAGNOSIS — C773 Secondary and unspecified malignant neoplasm of axilla and upper limb lymph nodes: Secondary | ICD-10-CM

## 2013-12-12 DIAGNOSIS — R0902 Hypoxemia: Secondary | ICD-10-CM

## 2013-12-12 DIAGNOSIS — Z171 Estrogen receptor negative status [ER-]: Secondary | ICD-10-CM

## 2013-12-12 DIAGNOSIS — N611 Abscess of the breast and nipple: Secondary | ICD-10-CM

## 2013-12-12 DIAGNOSIS — Z5111 Encounter for antineoplastic chemotherapy: Secondary | ICD-10-CM

## 2013-12-12 LAB — COMPREHENSIVE METABOLIC PANEL (CC13)
ALK PHOS: 105 U/L (ref 40–150)
ALT: 13 U/L (ref 0–55)
ANION GAP: 9 meq/L (ref 3–11)
AST: 11 U/L (ref 5–34)
Albumin: 3.5 g/dL (ref 3.5–5.0)
BUN: 17.9 mg/dL (ref 7.0–26.0)
CO2: 25 mEq/L (ref 22–29)
Calcium: 9.7 mg/dL (ref 8.4–10.4)
Chloride: 105 mEq/L (ref 98–109)
Creatinine: 0.8 mg/dL (ref 0.6–1.1)
GLUCOSE: 177 mg/dL — AB (ref 70–140)
Potassium: 4 mEq/L (ref 3.5–5.1)
Sodium: 139 mEq/L (ref 136–145)
Total Protein: 6.8 g/dL (ref 6.4–8.3)

## 2013-12-12 LAB — CBC WITH DIFFERENTIAL/PLATELET
BASO%: 0.1 % (ref 0.0–2.0)
Basophils Absolute: 0 10*3/uL (ref 0.0–0.1)
EOS%: 0 % (ref 0.0–7.0)
Eosinophils Absolute: 0 10*3/uL (ref 0.0–0.5)
HCT: 27.6 % — ABNORMAL LOW (ref 34.8–46.6)
HGB: 9 g/dL — ABNORMAL LOW (ref 11.6–15.9)
LYMPH%: 6.1 % — AB (ref 14.0–49.7)
MCH: 28.9 pg (ref 25.1–34.0)
MCHC: 32.6 g/dL (ref 31.5–36.0)
MCV: 88.7 fL (ref 79.5–101.0)
MONO#: 0.9 10*3/uL (ref 0.1–0.9)
MONO%: 5.8 % (ref 0.0–14.0)
NEUT#: 14.1 10*3/uL — ABNORMAL HIGH (ref 1.5–6.5)
NEUT%: 88 % — ABNORMAL HIGH (ref 38.4–76.8)
PLATELETS: 360 10*3/uL (ref 145–400)
RBC: 3.11 10*6/uL — AB (ref 3.70–5.45)
RDW: 14.1 % (ref 11.2–14.5)
WBC: 16.1 10*3/uL — ABNORMAL HIGH (ref 3.9–10.3)
lymph#: 1 10*3/uL (ref 0.9–3.3)

## 2013-12-12 MED ORDER — TRAMADOL HCL 50 MG PO TABS: 50.0000 mg | ORAL_TABLET | Freq: Four times a day (QID) | ORAL | Status: DC | PRN

## 2013-12-12 MED ORDER — SODIUM CHLORIDE 0.9 % IV SOLN
150.0000 mg | Freq: Once | INTRAVENOUS | Status: AC
  Administered 2013-12-12: 150 mg via INTRAVENOUS
  Filled 2013-12-12: qty 5

## 2013-12-12 MED ORDER — PALONOSETRON HCL INJECTION 0.25 MG/5ML
0.2500 mg | Freq: Once | INTRAVENOUS | Status: AC
  Administered 2013-12-12: 0.25 mg via INTRAVENOUS

## 2013-12-12 MED ORDER — SODIUM CHLORIDE 0.9 % IV SOLN
600.0000 mg/m2 | Freq: Once | INTRAVENOUS | Status: AC
  Administered 2013-12-12: 1120 mg via INTRAVENOUS
  Filled 2013-12-12: qty 56

## 2013-12-12 MED ORDER — SODIUM CHLORIDE 0.9 % IV SOLN
Freq: Once | INTRAVENOUS | Status: AC
  Administered 2013-12-12: 11:00:00 via INTRAVENOUS

## 2013-12-12 MED ORDER — DEXAMETHASONE SODIUM PHOSPHATE 20 MG/5ML IJ SOLN
12.0000 mg | Freq: Once | INTRAMUSCULAR | Status: AC
Start: 2013-12-12 — End: 2013-12-12
  Administered 2013-12-12: 12 mg via INTRAVENOUS

## 2013-12-12 MED ORDER — LINAGLIPTIN 5 MG PO TABS: 5.0000 mg | ORAL_TABLET | Freq: Every day | ORAL | Status: DC

## 2013-12-12 MED ORDER — SODIUM CHLORIDE 0.9 % IJ SOLN
10.0000 mL | INTRAMUSCULAR | Status: DC | PRN
  Administered 2013-12-12: 10 mL
  Filled 2013-12-12: qty 10

## 2013-12-12 MED ORDER — PALONOSETRON HCL INJECTION 0.25 MG/5ML
INTRAVENOUS | Status: AC
  Filled 2013-12-12: qty 5

## 2013-12-12 MED ORDER — HEPARIN SOD (PORK) LOCK FLUSH 100 UNIT/ML IV SOLN
500.0000 [IU] | Freq: Once | INTRAVENOUS | Status: AC | PRN
  Administered 2013-12-12: 500 [IU]
  Filled 2013-12-12: qty 5

## 2013-12-12 MED ORDER — DEXAMETHASONE SODIUM PHOSPHATE 20 MG/5ML IJ SOLN
INTRAMUSCULAR | Status: AC
  Filled 2013-12-12: qty 5

## 2013-12-12 MED ORDER — DOXORUBICIN HCL CHEMO IV INJECTION 2 MG/ML
60.0000 mg/m2 | Freq: Once | INTRAVENOUS | Status: AC
Start: 2013-12-12 — End: 2013-12-12
  Administered 2013-12-12: 112 mg via INTRAVENOUS
  Filled 2013-12-12: qty 56

## 2013-12-12 NOTE — Progress Notes (Signed)
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 She is due cycle 2 dose dense AC today and will have neulasta on 12-13-13.   INTERVAL HISTORY:  Patient reports that she did well with cycle 1 dose dense AC and with the neulasta injection. Nausea was controlled with prn antiemetics, none for last few days. She had slight oral mucositis symptoms, now resolved, and I have added oral ice during adriamycin infusion to orders now. Bowels have moved regularly. Blood sugars at home have been 125 - 140 in past several days, on Tradienta + insulin. She is not having much discomfort at right mastectomy site, and denies drainage or bleeding there. Sutures are still in; I communicated with Dr [name] after this visit, with information that she missed follow up with him for suture removal and his office will reschedule. She has completed cefuroxime, this given at hospital DC 11-07-13. She denies fever or other symptoms of infection. She has had no problems with PAC.  Patient states that she lost her oxycodone prescription after leaving office from recent visit.  ONCOLOGIC HISTORY Ms. Thibodaux presented to ED 10/22/2013 with apparent necrotizing soft tissue infection/ cancer of right breast, with orange peel appearance also noted. Chest x-ray showed the right breast to be asymmetrically enlarged with extensive subcutaneous gas.She went to urgent debridement by Dr Greer Pickerel on 10-22-13 with  Noted 4 cm skin defect in the lower inner quadrant measuring approximately 4 cm expelling air when breast was manipulated; see photos included with the admission note.Pathology ([id number]) had high-grade carcinoma involving ulcerated skin and within  the dermal and subcutaneous lymphatics. The tumor was negative for estrogen, progesterone, and HER-2(signals ratio 1.24, number per cell 3.6), with an MIB-1 of 95%. Gross cystic disease fluid protein was also negative.  The patient was admitted and started on antibiotics. On 10-24-13, with wound appearing necrotic and foul-smelling, she was taken to right total mastectomy by Dr.Wakefield, with entire breast appearing necrotic at that procedure, which involved considerable blood loss. The pathology from this procedure([id number]) showed high grade carcinoma.  CT chest abdomen and pelvis 10-25-13 showed no evidence of metastatic disease beyond breast and axilla. She went back for completion mastectomy with right axillary node dissection by Dr [name] on 10-26-13, with pathology ([id number]) invasive ductal carcinoma, grade 3, confirmed triple negative on this specimen also, and 2 of 16 axillary lymph nodes involved by macrometastatic deposits. The patient continued on antibiotics and eventually discharged 11/03/2013. PAC was placed by Dr [name] on 11-16-13. She began chemotherapy with cycle 1 dose dense adriamycin cytoxan on 11-28-13, with neulasta 11-29-13.    Review of systems as above, also: Some taste disturbance but able to eat and to drink fluids. No noted changes in left breast. No cough or SOB. No bladder symptoms. No bleeding. No extremity swelling. No new or different pain. Remainder of 10 point Review of Systems negative.  Objective:  Vital signs in last 24 hours:  BP 143/67  Pulse 77  Temp(Src) 98.1 F (36.7 C) (Oral)  Resp 18  Ht 5' 4" (1.626 m)  Wt 167 lb 14.4 oz (76.159 kg)  BMI 28.81 kg/m2 Weight on same scales as 11-28-13 is up 4 lbs. Alert, oriented and appropriate. Ambulatory without difficulty,smiling,  looks comfortable, cooperative and very pleasant.   HEENT:PERRL, sclerae not icteric. Oral mucosa moist without lesions, posterior pharynx clear. Upper dentures in, some  missing lower teeth, no obvious severe dental problems. Neck supple. No JVD.  Lymphatics:no cervical,suraclavicular or left axillary adenopathy.  Resp: clear to auscultation bilaterally and normal percussion bilaterally Cardio: regular rate and rhythm. No gallop. GI: soft, nontender, not distended, no mass or organomegaly. Normally active bowel sounds. Surgical incisions well healed. Musculoskeletal/ Extremities: without pitting edema, cords, tenderness. Back not tender. Neuro: speech fluent, CN/motor/cerebellar/sensory grossly nonfocal. PSYCH: mood and affect appropriate Skin without rash, ecchymosis, petechiae Breasts: Right mastectomy scar covered with dirty dressing, sutures still in place, closed, no erythema, tenderness or swelling other than apparent tumor nodule just at medial end of excision. The nodule is firm, not mobile, ~ 1.5 cm diameter and over 1 cm in height, with small area of tumor eroding thru skin inferiorly. There is no mention of this nodule in last provider note.Right axilla without mass, tenderness or other concern. Left breast without dominant mass, skin or nipple findings, left axilla benign. Portacath-without erythema or tenderness  Lab Results:  Results for orders placed in visit on 12/12/13  CBC WITH DIFFERENTIAL      Result Value Ref Range   WBC 16.1 (*) 3.9 - 10.3 10e3/uL   NEUT# 14.1 (*) 1.5 - 6.5 10e3/uL   HGB 9.0 (*) 11.6 - 15.9 g/dL   HCT 27.6 (*) 34.8 - 46.6 %   Platelets 360  145 - 400 10e3/uL   MCV 88.7  79.5 - 101.0 fL   MCH 28.9  25.1 - 34.0 pg   MCHC 32.6  31.5 - 36.0 g/dL   RBC 3.11 (*) 3.70 - 5.45 10e6/uL   RDW 14.1  11.2 - 14.5 %   lymph# 1.0  0.9 - 3.3 10e3/uL   MONO# 0.9  0.1 - 0.9 10e3/uL   Eosinophils Absolute 0.0  0.0 - 0.5 10e3/uL   Basophils Absolute 0.0  0.0 - 0.1 10e3/uL   NEUT% 88.0 (*) 38.4 - 76.8 %   LYMPH% 6.1 (*) 14.0 - 49.7 %   MONO% 5.8  0.0 - 14.0 %   EOS% 0.0  0.0 - 7.0 %   BASO% 0.1  0.0 - 2.0 %  COMPREHENSIVE  METABOLIC PANEL (LT90)      Result Value Ref Range   Sodium 139  136 - 145 mEq/L   Potassium 4.0  3.5 - 5.1 mEq/L   Chloride 105  98 - 109 mEq/L   CO2 25  22 - 29 mEq/L   Glucose 177 (*) 70 - 140 mg/dl   BUN 17.9  7.0 - 26.0 mg/dL   Creatinine 0.8  0.6 - 1.1 mg/dL   Total Bilirubin <0.20  0.20 - 1.20 mg/dL   Alkaline Phosphatase 105  40 - 150 U/L   AST 11  5 - 34 U/L   ALT 13  0 - 55 U/L   Total Protein 6.8  6.4 - 8.3 g/dL   Albumin 3.5  3.5 - 5.0 g/dL   Calcium 9.7  8.4 - 10.4 mg/dL   Anion Gap 9  3 - 11 mEq/L     Studies/Results:  No results found.  Medications: I have reviewed the patient's current medications. She reports losing her last oxycodone and fortunately does not seem to need this now, so that was not refilled;I did tell her that we cannot risk narcotic prescriptions getting onto the streets.  I did refill tramadol  50 mg q 6 hr prn. Refill given for oral hypoglycemic Tradienta 5 mg ( I am not clear who is monitoring DM now). She has no GERD symptoms so protonix not refilled, this apparently started in hospital for N/V after consult without endoscopy by [facility]. Cefuroxime not refilled, so not presently on antibiotics.  DISCUSSION: Communication with Dr [name] as above; he is also not surprised that there is some local recurrence given initial involvement.  Assessment/Plan: 1.inflammatory, locally advanced and node positive triple negative right breast cancer: post right mastectomy and axillary node dissection in total 3 surgeries at presentation 10-2013, but no distant metastatic disease by CTs then. Tumor nodule medial aspect of mastectomy incision. Continuing chemotherapy, cycle 2 dose dense AC today with neulasta support tomorrow. Add oral ice during adria infusion (with dentures out) to decrease mucositis. Next med onc provider visit 12-19-13. 2.Sutures still in + tumor nodule medially at surgical incision: missed f/u with Dr [name], which his office will  reschedule. Redressed today. Seems reasonable to me to follow the nodule closely at least for short term as intensive chemo continues. 3.PAC in 4.Diabetes on insulin and oral agent, following blood sugars at home, which have been in reasonable range. I am not clear who manages her diabetes, possibly [facility]. 5.post cholecystectomy, previous ventral hernia repair with mesh 2011, C section. 6.NOTE reports having lost last oxycodone prescription.  Chemotherapy and neulasta orders confirmed. All questions answered to her satisfaction and she understands discussion and instructions. She knows how to contact this office if needed prior to next scheduled visit. Time spent 40 min including >50% counseling and coordination of care.  Ahmad Vanwey P, MD   12/12/2013, 10:26 AM

## 2013-12-12 NOTE — Patient Instructions (Signed)
[facility] Discharge Instructions for Patients Receiving Chemotherapy  Today you received the following chemotherapy agents:  Adriamycin and Cytoxan  To help prevent nausea and vomiting after your treatment, we encourage you to take your nausea medication as ordered per MD.   If you develop nausea and vomiting that is not controlled by your nausea medication, call the clinic.   BELOW ARE SYMPTOMS THAT SHOULD BE REPORTED IMMEDIATELY:  *FEVER GREATER THAN 100.5 F  *CHILLS WITH OR WITHOUT FEVER  NAUSEA AND VOMITING THAT IS NOT CONTROLLED WITH YOUR NAUSEA MEDICATION  *UNUSUAL SHORTNESS OF BREATH  *UNUSUAL BRUISING OR BLEEDING  TENDERNESS IN MOUTH AND THROAT WITH OR WITHOUT PRESENCE OF ULCERS  *URINARY PROBLEMS  *BOWEL PROBLEMS  UNUSUAL RASH Items with * indicate a potential emergency and should be followed up as soon as possible.  Feel free to call the clinic you have any questions or concerns. The clinic phone number is [phone].

## 2013-12-12 NOTE — Progress Notes (Signed)
1155-Positive blood return pre, during and post Adriamycin via PAC.

## 2013-12-13 ENCOUNTER — Ambulatory Visit (HOSPITAL_BASED_OUTPATIENT_CLINIC_OR_DEPARTMENT_OTHER): Payer: BC Managed Care – PPO

## 2013-12-13 VITALS — BP 136/56 | HR 64 | Temp 98.5°F

## 2013-12-13 DIAGNOSIS — C50919 Malignant neoplasm of unspecified site of unspecified female breast: Secondary | ICD-10-CM

## 2013-12-13 DIAGNOSIS — C773 Secondary and unspecified malignant neoplasm of axilla and upper limb lymph nodes: Secondary | ICD-10-CM

## 2013-12-13 DIAGNOSIS — Z5189 Encounter for other specified aftercare: Secondary | ICD-10-CM

## 2013-12-13 DIAGNOSIS — C50911 Malignant neoplasm of unspecified site of right female breast: Secondary | ICD-10-CM

## 2013-12-13 MED ORDER — PEGFILGRASTIM INJECTION 6 MG/0.6ML
6.0000 mg | Freq: Once | SUBCUTANEOUS | Status: AC
  Administered 2013-12-13: 6 mg via SUBCUTANEOUS
  Filled 2013-12-13: qty 0.6

## 2013-12-15 ENCOUNTER — Telehealth (INDEPENDENT_AMBULATORY_CARE_PROVIDER_SITE_OTHER): Payer: Self-pay

## 2013-12-15 NOTE — Telephone Encounter (Signed)
Tried calling pt but can't leave vm b/c it's full. I need for the pt to come in for a nurse only to have sutures removed per Dr [name].

## 2013-12-19 ENCOUNTER — Other Ambulatory Visit (HOSPITAL_BASED_OUTPATIENT_CLINIC_OR_DEPARTMENT_OTHER): Payer: BC Managed Care – PPO

## 2013-12-19 ENCOUNTER — Telehealth (INDEPENDENT_AMBULATORY_CARE_PROVIDER_SITE_OTHER): Payer: Self-pay

## 2013-12-19 ENCOUNTER — Ambulatory Visit (HOSPITAL_BASED_OUTPATIENT_CLINIC_OR_DEPARTMENT_OTHER): Payer: BC Managed Care – PPO | Admitting: Adult Health

## 2013-12-19 ENCOUNTER — Telehealth: Payer: Self-pay | Admitting: Adult Health

## 2013-12-19 ENCOUNTER — Encounter: Payer: Self-pay | Admitting: Adult Health

## 2013-12-19 VITALS — BP 151/72 | HR 106 | Temp 98.5°F | Resp 18 | Ht 64.0 in | Wt 166.7 lb

## 2013-12-19 DIAGNOSIS — C773 Secondary and unspecified malignant neoplasm of axilla and upper limb lymph nodes: Secondary | ICD-10-CM

## 2013-12-19 DIAGNOSIS — N611 Abscess of the breast and nipple: Secondary | ICD-10-CM

## 2013-12-19 DIAGNOSIS — C50319 Malignant neoplasm of lower-inner quadrant of unspecified female breast: Secondary | ICD-10-CM

## 2013-12-19 DIAGNOSIS — C50919 Malignant neoplasm of unspecified site of unspecified female breast: Secondary | ICD-10-CM

## 2013-12-19 DIAGNOSIS — Z171 Estrogen receptor negative status [ER-]: Secondary | ICD-10-CM

## 2013-12-19 DIAGNOSIS — E119 Type 2 diabetes mellitus without complications: Secondary | ICD-10-CM

## 2013-12-19 DIAGNOSIS — R0902 Hypoxemia: Secondary | ICD-10-CM

## 2013-12-19 LAB — CBC WITH DIFFERENTIAL/PLATELET
BASO%: 1 % (ref 0.0–2.0)
Basophils Absolute: 0.1 10*3/uL (ref 0.0–0.1)
EOS%: 0.1 % (ref 0.0–7.0)
Eosinophils Absolute: 0 10*3/uL (ref 0.0–0.5)
HCT: 29.7 % — ABNORMAL LOW (ref 34.8–46.6)
HGB: 9.8 g/dL — ABNORMAL LOW (ref 11.6–15.9)
LYMPH%: 16.3 % (ref 14.0–49.7)
MCH: 29.6 pg (ref 25.1–34.0)
MCHC: 32.9 g/dL (ref 31.5–36.0)
MCV: 90 fL (ref 79.5–101.0)
MONO#: 0.7 10*3/uL (ref 0.1–0.9)
MONO%: 6.8 % (ref 0.0–14.0)
NEUT#: 8 10*3/uL — ABNORMAL HIGH (ref 1.5–6.5)
NEUT%: 75.8 % (ref 38.4–76.8)
Platelets: 314 10*3/uL (ref 145–400)
RBC: 3.3 10*6/uL — ABNORMAL LOW (ref 3.70–5.45)
RDW: 15.3 % — AB (ref 11.2–14.5)
WBC: 10.5 10*3/uL — ABNORMAL HIGH (ref 3.9–10.3)
lymph#: 1.7 10*3/uL (ref 0.9–3.3)

## 2013-12-19 LAB — COMPREHENSIVE METABOLIC PANEL (CC13)
ALK PHOS: 126 U/L (ref 40–150)
ALT: 13 U/L (ref 0–55)
AST: 8 U/L (ref 5–34)
Albumin: 3.7 g/dL (ref 3.5–5.0)
Anion Gap: 8 mEq/L (ref 3–11)
BUN: 18.5 mg/dL (ref 7.0–26.0)
CO2: 26 mEq/L (ref 22–29)
Calcium: 9.4 mg/dL (ref 8.4–10.4)
Chloride: 103 mEq/L (ref 98–109)
Creatinine: 0.8 mg/dL (ref 0.6–1.1)
Glucose: 186 mg/dl — ABNORMAL HIGH (ref 70–140)
Potassium: 3.7 mEq/L (ref 3.5–5.1)
Sodium: 137 mEq/L (ref 136–145)
Total Protein: 7 g/dL (ref 6.4–8.3)

## 2013-12-19 NOTE — Progress Notes (Signed)
[place]  [phone]) [phone] [phone]     ID: Carmen Stone OB: 1956-02-21  MR#: [id number]  IOM#:[id number]  PCP: [name], MD GYN:   SU: [name] MD OTHER MD:   CHIEF COMPLAINT: 58 y/o woman with inflammatory breast cancer undergoing adjuvant chemotherapy  BREAST CANCER HISTORY: As per previously dictated Dr.Magrinat's note:  Ms. Forsee presented to the emergency room on 59/02/2014 complaining of right breast pain and discharge. On exam the breast was diffusely erythematous with" orange peel" appearance. She was admitted for debridement and surgery he describes a skin defect in the lower inner quadrant measuring approximately 4 cm. When the breast was breast, air came out through the hole--there are photos included with the admission note. A chest x-ray was obtained which showed the right breast to be asymmetrically enlarged with extensive subcutaneous gas.  On the same day the patient underwent debridement of the wound, with the pathology (SZA [id number]) showing high-grade carcinoma involving ulcerated skin. There was tumor within the dermal and subcutaneous lymphatics. The tumor was negative for estrogen, progesterone, and HER-2(signals ratio 1.24, number per cell 3.6), with an MIB-1 of 95%. Gross cystic disease fluid protein was also negative.  The patient was admitted and started on antibiotics. On 10/24/2013 an attempt was made at further debridement, but the inside of the breast essentially came out in hand falls. The pathology from this procedure(SZA 15-20 and 26) again showed high-grade carcinoma.  On 10/25/2013 the patient underwent CT scans of the chest abdomen and pelvis. Aside from disease in the breast and right axilla there was no evidence of metastatic disease.  Accordingly Dr. [name] proceeded to right mastectomy with axillary lymph node dissection 10/26/2013. Results of this procedure ([id number]) showed a 15  cm defect in the middle of the breast, which is 4 prior necrotic tissue had been scoped out. There was nevertheless extensive invasive ductal carcinoma, grade 3, in the specimen and repeat or blastic panel was again triple negative. 2 of 16 axillary lymph nodes were involved by macrometastatic deposits. The patient was continued on antibiotics and eventually discharged 11/03/2013.  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  This is given on day 1 of a 14 day cycle, with Neulasta given on day 2 for granulocyte support.  She is currently cycle 2 day 8 of therapy.  Carmen Stone is feeling well today following her treatment last week.  She is feeling moderately well.  Her lab work is stable.  She does complain of sensitivity of her feet bilaterally, but denies any numbness, tingling, peeling or cracking of the skin.  She does report that her right mastectomy site stitches are pulling and are tender when that happens.  Otherwise, she denies fevers, chills, nausea, vomiting, constipation, diarrhea, mouth pain, or any further concerns.    REVIEW OF SYSTEMS: A 10 point review of systems is been assessed and pertinent symptoms as mentioned in interval history   PAST MEDICAL HISTORY: Past Medical History  Diagnosis Date  . Diabetes mellitus, type II   . Inflammatory breast cancer     PAST SURGICAL HISTORY: Past Surgical History  Procedure Laterality Date  . Cesarean section    . Ventral hernia repair  2011    repair incacerated VH with biologic mesh;  cholecystectomy  . Cholecystectomy  2011  . Incision and drainage of wound Right 10/22/2013    Procedure: IRRIGATION AND DRAINAGE AND DEBRIDEMENT RIGHT BREAST WOUND;  Surgeon: [name], MD;  Location: [place];   Service: General;  Laterality: Right;  . Breast biopsy Right 10/22/2013    Procedure: BREAST BIOPSY;  Surgeon: [name], MD;  Location: [place];  Service: General;  Laterality: Right;  . Irrigation and debridement abscess Right 10/24/2013    Procedure: Right total mastectomy;  Surgeon: [name], MD;  Location: [place];  Service: General;  Laterality: Right;  . Dressing change under anesthesia Right 10/26/2013    Procedure: COMPLETION MASTECTOMY, AXILLARY DISSECTION;  Surgeon: [name], MD;  Location: [place];  Service: General;  Laterality: Right;  . Portacath placement N/A 11/16/2013    Procedure: INSERTION PORT-A-CATH;  Surgeon: [name], MD;  Location: [place];  Service: General;  Laterality: N/A;    FAMILY HISTORY No family history on file. The patient's father was a heavy smoker and died from complications of emphysema at the age of 63. The patient's mother died from lung cancer at the age of 41. She also was a heavy smoker. The patient had one brother, no sisters. There is no history of breast or ovarian cancer in the family.  GYNECOLOGIC HISTORY:  Menarche age 58, first live birth age 34, the patient is GX P1. She went through the change of life approximately age 58. She did not take hormone replacement.  SOCIAL HISTORY:  Carmen Stone is divorced. She works for [employer] and also works a second job at [employer] on [employer]. She plans to be on disability during her chemoradiation treatments. She lives alone, but her daughter Carmen Stone frequently stays there overnight. 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 She spends her time between helping her mom and helping her grandfather, Lorriane Dehart, who lives in [place] and has significant dementia. Mr. Bran was present at the 11/18/2013 visit only because Carmen Stone wanted to be present and otherwise no one would have been with Mr. Tudor. Gwynn has no  grandchildren. She attends a [employer] adult church    ADVANCED DIRECTIVES: In place. The patient has named her brother Clide Cliff as her healthcare power of attorney. Iona Beard can be reached at [phone]. He is a Pharmacist, hospital locally  HEALTH MAINTENANCE: History  Substance Use Topics  . Smoking status: Never Smoker   . Smokeless tobacco: Not on file  . Alcohol Use: No     Colonoscopy:  PAP:  Bone density:  Lipid panel:  No Known Allergies  Current Outpatient Prescriptions  Medication Sig Dispense Refill  . acetaminophen (TYLENOL) 325 MG tablet Take 650 mg by mouth every 6 (six) hours as needed for mild pain.      Marland Kitchen dexamethasone (DECADRON) 4 MG tablet Take 2 tablets by mouth once a day on the day after chemotherapy and then take 2 tablets two times a day for 2 days. Take with food.  30 tablet  1  . glucose blood test strip Use as instructed  100 each  12  . glucose monitoring kit (FREESTYLE) monitoring kit 1 each by Does not apply route as needed for other. Check blood sugars daily.  1 each  0  . Insulin Glargine (LANTUS SOLOSTAR) 100 UNIT/ML Solostar Pen Inject 10 Units into the skin daily at 10 pm.  15 mL  3  . lidocaine-prilocaine (EMLA) cream Apply 1 application  topically as needed. Apply over port area 1-2 hours before treatment, cover with plastic wrap  30 g  0  . linagliptin (TRADJENTA) 5 MG TABS tablet Take 1 tablet (5 mg total) by mouth daily.  30 tablet  0  . LORazepam (ATIVAN) 0.5 MG tablet Take 1 tablet (0.5 mg total) by mouth at bedtime as needed (Nausea or vomiting).  30 tablet  0  . ondansetron (ZOFRAN) 8 MG tablet Take 1 tablet (8 mg total) by mouth every 8 (eight) hours as needed for nausea or vomiting.  20 tablet  3  . oxyCODONE-acetaminophen (PERCOCET) 10-325 MG per tablet Take 1 tablet by mouth every 6 (six) hours as needed for pain.  20 tablet  0  . pantoprazole (PROTONIX) 40 MG tablet Take 1 tablet (40 mg total) by mouth daily at 6 (six) AM.  30 tablet  0  .  prochlorperazine (COMPAZINE) 10 MG tablet Take 1 tablet (10 mg total) by mouth every 6 (six) hours as needed (Nausea or vomiting).  30 tablet  1  . traMADol (ULTRAM) 50 MG tablet Take 1 tablet (50 mg total) by mouth every 6 (six) hours as needed.  20 tablet  0   No current facility-administered medications for this visit.    OBJECTIVE: Middle-aged [place] American woman in no acute distress Filed Vitals:   12/19/13 1250  BP: 151/72  Pulse: 106  Temp: 98.5 F (36.9 C)  Resp: 18     Body mass index is 28.6 kg/(m^2).    ECOG FS:1 - Symptomatic but completely ambulatory  GENERAL: Patient is a well appearing female in no acute distress HEENT:  Sclerae anicteric.  Oropharynx clear and moist. No ulcerations or evidence of oropharyngeal candidiasis. Neck is supple.  NODES:  No cervical, supraclavicular, or axillary lymphadenopathy palpated.  BREAST EXAM:  Right mastectomy site well healed, stitches still in place. LUNGS:  Clear to auscultation bilaterally.  No wheezes or rhonchi. HEART:  Regular rate and rhythm. No murmur appreciated. ABDOMEN:  Soft, nontender.  Positive, normoactive bowel sounds. No organomegaly palpated. MSK:  No focal spinal tenderness to palpation. Full range of motion bilaterally in the upper extremities. EXTREMITIES:  No peripheral edema.   SKIN:  Clear with no obvious rashes or skin changes. No nail dyscrasia.  Her feet bilaterally were examined, they are intact with no redness, sensation intact bilaterally NEURO:  Nonfocal. Well oriented.  Appropriate affect.    LAB RESULTS:  CMP     Component Value Date/Time   NA 139 12/12/2013 0835   NA 142 11/16/2013 0847   K 4.0 12/12/2013 0835   K 4.1 11/16/2013 0847   CL 106 11/16/2013 0847   CO2 25 12/12/2013 0835   CO2 27 11/07/2013 0540   GLUCOSE 177* 12/12/2013 0835   GLUCOSE 149* 11/16/2013 0847   BUN 17.9 12/12/2013 0835   BUN 25* 11/16/2013 0847   CREATININE 0.8 12/12/2013 0835   CREATININE 1.20* 11/16/2013 0847   CALCIUM  9.7 12/12/2013 0835   CALCIUM 8.8 11/07/2013 0540   PROT 6.8 12/12/2013 0835   PROT 6.5 10/30/2013 1014   ALBUMIN 3.5 12/12/2013 0835   ALBUMIN 2.2* 10/30/2013 1014   AST 11 12/12/2013 0835   AST 20 10/30/2013 1014   ALT 13 12/12/2013 0835   ALT 6 10/30/2013 1014   ALKPHOS 105 12/12/2013 0835   ALKPHOS 70 10/30/2013 1014   BILITOT <0.20 12/12/2013 0835   BILITOT <0.2* 10/30/2013 1014   GFRNONAA 37* 11/07/2013 0540   GFRAA  43* 11/07/2013 0540    I No results found for this basename: SPEP,  UPEP,   kappa and lambda light chains    Lab Results  Component Value Date   WBC 10.5* 12/19/2013   NEUTROABS 8.0* 12/19/2013   HGB 9.8* 12/19/2013   HCT 29.7* 12/19/2013   MCV 90.0 12/19/2013   PLT 314 12/19/2013      Chemistry      Component Value Date/Time   NA 139 12/12/2013 0835   NA 142 11/16/2013 0847   K 4.0 12/12/2013 0835   K 4.1 11/16/2013 0847   CL 106 11/16/2013 0847   CO2 25 12/12/2013 0835   CO2 27 11/07/2013 0540   BUN 17.9 12/12/2013 0835   BUN 25* 11/16/2013 0847   CREATININE 0.8 12/12/2013 0835   CREATININE 1.20* 11/16/2013 0847      Component Value Date/Time   CALCIUM 9.7 12/12/2013 0835   CALCIUM 8.8 11/07/2013 0540   ALKPHOS 105 12/12/2013 0835   ALKPHOS 70 10/30/2013 1014   AST 11 12/12/2013 0835   AST 20 10/30/2013 1014   ALT 13 12/12/2013 0835   ALT 6 10/30/2013 1014   BILITOT <0.20 12/12/2013 0835   BILITOT <0.2* 10/30/2013 1014       No results found for this basename: LABCA2    No components found with this basename: LABCA125    No results found for this basename: INR,  in the last 168 hours  Urinalysis    Component Value Date/Time   COLORURINE ORANGE* 10/22/2013 0159   APPEARANCEUR CLOUDY* 10/22/2013 0159   LABSPEC 1.043* 10/22/2013 0159   PHURINE 5.5 10/22/2013 0159   GLUCOSEU >1000* 10/22/2013 0159   HGBUR MODERATE* 10/22/2013 0159   BILIRUBINUR MODERATE* 10/22/2013 0159   KETONESUR 15* 10/22/2013 0159   PROTEINUR 100* 10/22/2013 0159   UROBILINOGEN 2.0* 10/22/2013 0159   NITRITE NEGATIVE  10/22/2013 0159   LEUKOCYTESUR SMALL* 10/22/2013 0159    STUDIES: Dg Chest 2 View  10/22/2013   CLINICAL DATA:  Right breast pain and swelling.  Fever.  EXAM: CHEST  2 VIEW  COMPARISON:  06/13/2010  FINDINGS: The right breast is asymmetrically enlarged with extensive subcutaneous gas. This creates increased density over the right chest. There is no evidence of pneumonia in the lateral projection. No cardiomegaly. No edema, effusion, or pneumothorax.  Critical Value/emergent results were called by telephone at the time of interpretation on 10/22/2013 at 4:17 AM to Dr. Kathrynn Humble , who verbally acknowledged these results.  IMPRESSION: Right breast swelling with extensive subcutaneous gas. If no recent intervention, findings concerning for necrotizing infection.   Electronically Signed   By: Jorje Guild M.D.   On: 10/22/2013 04:17   Ct Chest W Contrast  10/26/2013   CLINICAL DATA:  58 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 Evaluate for metastatic disease.  EXAM: CT CHEST, ABDOMEN, AND PELVIS WITH CONTRAST  TECHNIQUE: Multidetector CT imaging of the chest, abdomen and pelvis was performed following the standard protocol during bolus administration of intravenous contrast.  CONTRAST:  124m OMNIPAQUE IOHEXOL 300 MG/ML  SOLN  COMPARISON:  CT of abdomen pelvis 06/21/2010.  FINDINGS: CT CHEST FINDINGS  Mediastinum: Heart size is normal. There is no significant pericardial fluid, thickening or pericardial calcification. No pathologically enlarged mediastinal, bilateral hilar or internal mammary lymph nodes. Esophagus is unremarkable in appearance.  Lungs/Pleura: Trace bilateral pleural effusions layering dependently with some mild dependent subsegmental atelectasis in the lower lobes of the lungs bilaterally. No acute consolidative airspace disease. No  suspicious appearing pulmonary nodules or masses.  Musculoskeletal: Postoperative changes in the right breast are noted, with a large open wound  filled with packing material. The periphery of the open wound demonstrates a thick rind of soft tissue, some of which appears to enhance. In the medial aspect of the right breast (image 27 of series 2) there is a 1.5 x 1.3 cm enhancing soft tissue lesion, suspicious for a focus of residual tumor or a malignant lymph node. Numerous borderline enlarged and mildly enlarged right axillary lymph nodes are noted, measuring up to 12 mm in short axis. There are no aggressive appearing lytic or blastic lesions noted in the visualized portions of the skeleton.  CT ABDOMEN AND PELVIS FINDINGS  Abdomen/Pelvis: Several well-defined low-attenuation lesions in the liver appear unchanged in size, number and pattern of distribution compared to prior study 2012, presumably small cysts. The largest of these lesions measures 2.1 cm in segment 4A. No other suspicious hepatic lesions are noted. Status post cholecystectomy. The appearance of the pancreas, spleen, bilateral adrenal glands and bilateral kidneys is unremarkable.  Normal appendix (retrocecal in position). No significant volume of ascites. No pneumoperitoneum. No pathologic distention of small bowel. There are a few colonic diverticulae, particularly in the region of the sigmoid colon, without surrounding inflammatory changes to suggest an acute diverticulitis at this time. Several small ventral hernias are noted, containing several loops of small bowel and a short segment of the mid transverse colon, without evidence of bowel incarceration or obstruction at this time. No lymphadenopathy identified within the abdomen or pelvis. Status post hysterectomy. Ovaries are not confidently identified may be surgically absent or atrophic.  Musculoskeletal: There are no aggressive appearing lytic or blastic lesions noted in the visualized portions of the skeleton. Edema throughout the fat of the right flank, presumably reactive to the process in the right breast.  IMPRESSION: 1.  Postoperative changes of recent right breast surgery with packed open wound which has a thick rind of soft tissue, some of which enhances, a 1.3 x 1.5 cm enhancing lesion in the medial aspect of the right breast which may represent residual tumor or a malignant lymph node, and multiple enlarged right axillary nodes concerning for axillary nodal metastases. 2. No other definite signs of metastatic disease in the chest, abdomen or pelvis. 3. Multiple small ventral hernias containing several loops of small bowel, as well as the mid transverse colon, without associated bowel incarceration or obstruction at this time. 4. Trace bilateral pleural effusions with mild dependent subsegmental atelectasis in the lower lobes of the lungs bilaterally. 5. Status post cholecystectomy and hysterectomy. 6. Additional incidental findings, as above.   Electronically Signed   By: Vinnie Langton M.D.   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 Evaluate for metastatic disease.  EXAM: CT CHEST, ABDOMEN, AND PELVIS WITH CONTRAST  TECHNIQUE: Multidetector CT imaging of the chest, abdomen and pelvis was performed following the standard protocol during bolus administration of intravenous contrast.  CONTRAST:  164m OMNIPAQUE IOHEXOL 300 MG/ML  SOLN  COMPARISON:  CT of abdomen pelvis 06/21/2010.  FINDINGS: CT CHEST FINDINGS  Mediastinum: Heart size is normal. There is no significant pericardial fluid, thickening or pericardial calcification. No pathologically enlarged mediastinal, bilateral hilar or internal mammary lymph nodes. Esophagus is unremarkable in appearance.  Lungs/Pleura: Trace bilateral pleural effusions layering dependently with some mild dependent subsegmental atelectasis in the lower lobes  of the lungs bilaterally. No acute consolidative airspace disease. No suspicious appearing pulmonary nodules or masses.   Musculoskeletal: Postoperative changes in the right breast are noted, with a large open wound filled with packing material. The periphery of the open wound demonstrates a thick rind of soft tissue, some of which appears to enhance. In the medial aspect of the right breast (image 27 of series 2) there is a 1.5 x 1.3 cm enhancing soft tissue lesion, suspicious for a focus of residual tumor or a malignant lymph node. Numerous borderline enlarged and mildly enlarged right axillary lymph nodes are noted, measuring up to 12 mm in short axis. There are no aggressive appearing lytic or blastic lesions noted in the visualized portions of the skeleton.  CT ABDOMEN AND PELVIS FINDINGS  Abdomen/Pelvis: Several well-defined low-attenuation lesions in the liver appear unchanged in size, number and pattern of distribution compared to prior study 2012, presumably small cysts. The largest of these lesions measures 2.1 cm in segment 4A. No other suspicious hepatic lesions are noted. Status post cholecystectomy. The appearance of the pancreas, spleen, bilateral adrenal glands and bilateral kidneys is unremarkable.  Normal appendix (retrocecal in position). No significant volume of ascites. No pneumoperitoneum. No pathologic distention of small bowel. There are a few colonic diverticulae, particularly in the region of the sigmoid colon, without surrounding inflammatory changes to suggest an acute diverticulitis at this time. Several small ventral hernias are noted, containing several loops of small bowel and a short segment of the mid transverse colon, without evidence of bowel incarceration or obstruction at this time. No lymphadenopathy identified within the abdomen or pelvis. Status post hysterectomy. Ovaries are not confidently identified may be surgically absent or atrophic.  Musculoskeletal: There are no aggressive appearing lytic or blastic lesions noted in the visualized portions of the skeleton. Edema throughout the fat of the  right flank, presumably reactive to the process in the right breast.  IMPRESSION: 1. Postoperative changes of recent right breast surgery with packed open wound which has a thick rind of soft tissue, some of which enhances, a 1.3 x 1.5 cm enhancing lesion in the medial aspect of the right breast which may represent residual tumor or a malignant lymph node, and multiple enlarged right axillary nodes concerning for axillary nodal metastases. 2. No other definite signs of metastatic disease in the chest, abdomen or pelvis. 3. Multiple small ventral hernias containing several loops of small bowel, as well as the mid transverse colon, without associated bowel incarceration or obstruction at this time. 4. Trace bilateral pleural effusions with mild dependent subsegmental atelectasis in the lower lobes of the lungs bilaterally. 5. Status post cholecystectomy and hysterectomy. 6. Additional incidental findings, as above.   Electronically Signed   By: Vinnie Langton M.D.   On: 10/26/2013 08:35   Dg Chest Portable 1 View  11/16/2013   CLINICAL DATA:  Central catheter placement  EXAM: PORTABLE CHEST - 1 VIEW  COMPARISON:  Chest radiograph Oct 22, 2013 and chest CT Oct 25, 2013  FINDINGS: Port-A-Cath tip is in the superior vena cava. No pneumothorax. There is mild left base atelectasis. Elsewhere lungs are clear. Heart size and pulmonary vascularity are normal. No adenopathy. There is postoperative change on the right with clips in the right axilla.  IMPRESSION: Port-A-Cath tip in superior vena cava. No pneumothorax. Mild left base atelectasis. Lungs otherwise clear.   Electronically Signed   By: Lowella Grip M.D.   On: 11/16/2013 13:58   Dg Fluoro Guide Cv  Line-no Report  11/16/2013   CLINICAL DATA: PAC   FLOURO GUIDE CV LINE  Fluoroscopy was utilized by the requesting physician.  No radiographic  interpretation.     ASSESSMENT: 58 y.o. [place] woman  #1. stage IIIA  (pT3 pN1) invasive ductal carcinoma, grade  3, triple negative, with an MIB-1 of 95%.status post right modified radical mastectomy 10/26/2013  #2.  chemotherapy with dose dense Adriamycin and cyclophosphamide x 4 cycles, start date: 11/28/2013  #3. Followed by carboplatin and paclitaxel weekly x12, start date:   #4  postmastectomy radiation to follow after completion of chemotherapy.  PLAN:  Adriane is doing well following treatment today.  Her CBC is stable and I reviewed this with her in detail.  A CMP is pending.  The skin on her feet bilaterally are tender, however the skin is intact and isn't peeling.  I recommended that she apply Aquaphor to her feet at least daily.  She did tolerate her treatment well.    I discussed with Dr. [name] the fact that her stitches remained in.  I was able to remove most of her stitches, however there were two stitches at the medial site of her mastectomy incision that appear to have possibly grown into the skin.  I asked her to f/u with Dr. [name] this week to have them removed.  She does have transportation issues in getting to his office.  I stressed the importance of going to see them and she says she will go tomorrow.    Aarvi will turn in one week for labs, evaluation, and her next cycle of Doxorubicin and Cyclophosphamide.    I spent 25 minutes counseling the patient face to face.  The total time spent in the appointment was 30 minutes.   Minette Headland, [place] [phone] 12/19/2013 12:59 PM

## 2013-12-19 NOTE — Patient Instructions (Signed)
Your labs are stable following treatment.  You are doing well.  Apply Aquaphor to your hands and feet daily.  We will see you back in one week for your next treatment.  Please call us if you have any questions or concerns.

## 2013-12-19 NOTE — Telephone Encounter (Signed)
Melissa with Dr Magrinat's office was calling to schedule appt to see Dr [name] this wk. Melissa states that Mendel Ryder, NP has spoken with Dr [name] already today for Dr [name] to recheck pts right breast for retained sutures. Informed pt that I would send Dr [name] and Lars Mage they are both out of the office today. Informed Melissa that we would get in contact with the pt with appt date and time.

## 2013-12-19 NOTE — Telephone Encounter (Signed)
s/w Kenney Houseman, triage @ [facility] re appt w/Dr [name] this week. per Kenney Houseman message will be sent to Dr [name] for appt tomorrow or Friday and she will call pt w/appt. pt aware. no other orders per 7/6 pof. Tonya aware per [facility] she has spoken with Dr [name].

## 2013-12-20 ENCOUNTER — Encounter (INDEPENDENT_AMBULATORY_CARE_PROVIDER_SITE_OTHER): Payer: Self-pay | Admitting: General Surgery

## 2013-12-20 ENCOUNTER — Ambulatory Visit (INDEPENDENT_AMBULATORY_CARE_PROVIDER_SITE_OTHER): Payer: BC Managed Care – PPO | Admitting: General Surgery

## 2013-12-20 ENCOUNTER — Other Ambulatory Visit (INDEPENDENT_AMBULATORY_CARE_PROVIDER_SITE_OTHER): Payer: Self-pay | Admitting: General Surgery

## 2013-12-20 VITALS — BP 129/86 | HR 72 | Temp 97.9°F | Resp 14 | Ht 64.0 in | Wt 162.4 lb

## 2013-12-20 DIAGNOSIS — Z4802 Encounter for removal of sutures: Secondary | ICD-10-CM

## 2013-12-20 DIAGNOSIS — C50919 Malignant neoplasm of unspecified site of unspecified female breast: Secondary | ICD-10-CM

## 2013-12-20 MED ORDER — TRAMADOL HCL 50 MG PO TABS: 50.0000 mg | ORAL_TABLET | Freq: Four times a day (QID) | ORAL | Status: DC | PRN

## 2013-12-20 NOTE — Patient Instructions (Signed)
Removed two suture and placed a band aid over one of the suture, it bleed some. Going to made the patient an apt to see Dr [name]

## 2013-12-22 ENCOUNTER — Telehealth (INDEPENDENT_AMBULATORY_CARE_PROVIDER_SITE_OTHER): Payer: Self-pay

## 2013-12-22 NOTE — Telephone Encounter (Signed)
Mail order pharmacy calling to f/u on Rx faxed on 12/15/13 for pt's diabetic supplies.  Rx was faxed to Dr. [name].  I recommended they re-fax the request.  Please watch for this Rx.

## 2013-12-23 ENCOUNTER — Encounter: Payer: Self-pay | Admitting: Internal Medicine

## 2013-12-23 NOTE — Progress Notes (Signed)
Insurance is paying Neulasta 100%

## 2013-12-23 NOTE — Telephone Encounter (Signed)
Faxed form back to mail order pharmacy that Dr [name] will not sign for pt's diabetic supplies it needs to come from pt's PCP.

## 2013-12-26 ENCOUNTER — Ambulatory Visit (HOSPITAL_BASED_OUTPATIENT_CLINIC_OR_DEPARTMENT_OTHER): Payer: BC Managed Care – PPO

## 2013-12-26 ENCOUNTER — Other Ambulatory Visit (HOSPITAL_BASED_OUTPATIENT_CLINIC_OR_DEPARTMENT_OTHER): Payer: BC Managed Care – PPO

## 2013-12-26 ENCOUNTER — Encounter: Payer: Self-pay | Admitting: Adult Health

## 2013-12-26 ENCOUNTER — Ambulatory Visit (HOSPITAL_BASED_OUTPATIENT_CLINIC_OR_DEPARTMENT_OTHER): Payer: BC Managed Care – PPO | Admitting: Adult Health

## 2013-12-26 VITALS — BP 139/68 | HR 111 | Temp 98.5°F | Resp 20 | Ht 64.0 in | Wt 168.6 lb

## 2013-12-26 DIAGNOSIS — G8918 Other acute postprocedural pain: Secondary | ICD-10-CM

## 2013-12-26 DIAGNOSIS — C50919 Malignant neoplasm of unspecified site of unspecified female breast: Secondary | ICD-10-CM

## 2013-12-26 DIAGNOSIS — Z5111 Encounter for antineoplastic chemotherapy: Secondary | ICD-10-CM

## 2013-12-26 DIAGNOSIS — M79621 Pain in right upper arm: Secondary | ICD-10-CM

## 2013-12-26 DIAGNOSIS — N611 Abscess of the breast and nipple: Secondary | ICD-10-CM

## 2013-12-26 DIAGNOSIS — R0902 Hypoxemia: Secondary | ICD-10-CM

## 2013-12-26 DIAGNOSIS — C773 Secondary and unspecified malignant neoplasm of axilla and upper limb lymph nodes: Secondary | ICD-10-CM

## 2013-12-26 DIAGNOSIS — Z171 Estrogen receptor negative status [ER-]: Secondary | ICD-10-CM

## 2013-12-26 DIAGNOSIS — IMO0001 Reserved for inherently not codable concepts without codable children: Secondary | ICD-10-CM

## 2013-12-26 DIAGNOSIS — K219 Gastro-esophageal reflux disease without esophagitis: Secondary | ICD-10-CM

## 2013-12-26 DIAGNOSIS — E119 Type 2 diabetes mellitus without complications: Secondary | ICD-10-CM

## 2013-12-26 LAB — CBC WITH DIFFERENTIAL/PLATELET
BASO%: 1 % (ref 0.0–2.0)
Basophils Absolute: 0.2 10*3/uL — ABNORMAL HIGH (ref 0.0–0.1)
EOS ABS: 0 10*3/uL (ref 0.0–0.5)
EOS%: 0.1 % (ref 0.0–7.0)
HEMATOCRIT: 31.4 % — AB (ref 34.8–46.6)
HGB: 10.2 g/dL — ABNORMAL LOW (ref 11.6–15.9)
LYMPH%: 8.5 % — AB (ref 14.0–49.7)
MCH: 29.6 pg (ref 25.1–34.0)
MCHC: 32.4 g/dL (ref 31.5–36.0)
MCV: 91.2 fL (ref 79.5–101.0)
MONO#: 1 10*3/uL — AB (ref 0.1–0.9)
MONO%: 5.7 % (ref 0.0–14.0)
NEUT%: 84.7 % — AB (ref 38.4–76.8)
NEUTROS ABS: 15 10*3/uL — AB (ref 1.5–6.5)
PLATELETS: 286 10*3/uL (ref 145–400)
RBC: 3.44 10*6/uL — ABNORMAL LOW (ref 3.70–5.45)
RDW: 16.6 % — ABNORMAL HIGH (ref 11.2–14.5)
WBC: 17.7 10*3/uL — AB (ref 3.9–10.3)
lymph#: 1.5 10*3/uL (ref 0.9–3.3)

## 2013-12-26 LAB — COMPREHENSIVE METABOLIC PANEL (CC13)
ALT: 16 U/L (ref 0–55)
AST: 11 U/L (ref 5–34)
Albumin: 3.6 g/dL (ref 3.5–5.0)
Alkaline Phosphatase: 102 U/L (ref 40–150)
Anion Gap: 10 mEq/L (ref 3–11)
BUN: 19.2 mg/dL (ref 7.0–26.0)
CO2: 26 mEq/L (ref 22–29)
Calcium: 9.6 mg/dL (ref 8.4–10.4)
Chloride: 102 mEq/L (ref 98–109)
Creatinine: 0.8 mg/dL (ref 0.6–1.1)
GLUCOSE: 155 mg/dL — AB (ref 70–140)
Potassium: 4.5 mEq/L (ref 3.5–5.1)
SODIUM: 138 meq/L (ref 136–145)
TOTAL PROTEIN: 6.7 g/dL (ref 6.4–8.3)

## 2013-12-26 MED ORDER — DOXORUBICIN HCL CHEMO IV INJECTION 2 MG/ML
60.0000 mg/m2 | Freq: Once | INTRAVENOUS | Status: AC
  Administered 2013-12-26: 112 mg via INTRAVENOUS
  Filled 2013-12-26: qty 56

## 2013-12-26 MED ORDER — HEPARIN SOD (PORK) LOCK FLUSH 100 UNIT/ML IV SOLN
500.0000 [IU] | Freq: Once | INTRAVENOUS | Status: AC | PRN
  Administered 2013-12-26: 500 [IU]
  Filled 2013-12-26: qty 5

## 2013-12-26 MED ORDER — SODIUM CHLORIDE 0.9 % IV SOLN
600.0000 mg/m2 | Freq: Once | INTRAVENOUS | Status: AC
  Administered 2013-12-26: 1120 mg via INTRAVENOUS
  Filled 2013-12-26: qty 56

## 2013-12-26 MED ORDER — SODIUM CHLORIDE 0.9 % IV SOLN
150.0000 mg | Freq: Once | INTRAVENOUS | Status: AC
  Administered 2013-12-26: 150 mg via INTRAVENOUS
  Filled 2013-12-26: qty 5

## 2013-12-26 MED ORDER — PALONOSETRON HCL INJECTION 0.25 MG/5ML
INTRAVENOUS | Status: AC
  Filled 2013-12-26: qty 5

## 2013-12-26 MED ORDER — PANTOPRAZOLE SODIUM 40 MG PO TBEC: 40.0000 mg | DELAYED_RELEASE_TABLET | Freq: Every day | ORAL | Status: DC

## 2013-12-26 MED ORDER — DEXAMETHASONE SODIUM PHOSPHATE 20 MG/5ML IJ SOLN
12.0000 mg | Freq: Once | INTRAMUSCULAR | Status: AC
  Administered 2013-12-26: 11:00:00 via INTRAVENOUS

## 2013-12-26 MED ORDER — SODIUM CHLORIDE 0.9 % IJ SOLN
10.0000 mL | INTRAMUSCULAR | Status: DC | PRN
  Administered 2013-12-26: 10 mL
  Filled 2013-12-26: qty 10

## 2013-12-26 MED ORDER — OXYCODONE-ACETAMINOPHEN 10-325 MG PO TABS: 1.0000 | ORAL_TABLET | Freq: Four times a day (QID) | ORAL | Status: DC | PRN

## 2013-12-26 MED ORDER — SODIUM CHLORIDE 0.9 % IV SOLN
Freq: Once | INTRAVENOUS | Status: AC
  Administered 2013-12-26: 11:00:00 via INTRAVENOUS

## 2013-12-26 MED ORDER — DEXAMETHASONE SODIUM PHOSPHATE 20 MG/5ML IJ SOLN
INTRAMUSCULAR | Status: AC
  Filled 2013-12-26: qty 5

## 2013-12-26 MED ORDER — DEXAMETHASONE 4 MG PO TABS: ORAL_TABLET | ORAL | Status: DC

## 2013-12-26 MED ORDER — PALONOSETRON HCL INJECTION 0.25 MG/5ML
0.2500 mg | Freq: Once | INTRAVENOUS | Status: AC
  Administered 2013-12-26: 0.25 mg via INTRAVENOUS

## 2013-12-26 NOTE — Patient Instructions (Signed)
[facility] Discharge Instructions for Patients Receiving Chemotherapy  Today you received the following chemotherapy agents Adriamycin,cytoxan To help prevent nausea and vomiting after your treatment, we encourage you to take your nausea medication as prescribed. If you develop nausea and vomiting that is not controlled by your nausea medication, call the clinic.   BELOW ARE SYMPTOMS THAT SHOULD BE REPORTED IMMEDIATELY:  *FEVER GREATER THAN 100.5 F  *CHILLS WITH OR WITHOUT FEVER  NAUSEA AND VOMITING THAT IS NOT CONTROLLED WITH YOUR NAUSEA MEDICATION  *UNUSUAL SHORTNESS OF BREATH  *UNUSUAL BRUISING OR BLEEDING  TENDERNESS IN MOUTH AND THROAT WITH OR WITHOUT PRESENCE OF ULCERS  *URINARY PROBLEMS  *BOWEL PROBLEMS  UNUSUAL RASH Items with * indicate a potential emergency and should be followed up as soon as possible.  Feel free to call the clinic you have any questions or concerns. The clinic phone number is [phone].

## 2013-12-26 NOTE — Progress Notes (Signed)
[facility]  [phone] Fax:([phone]     ID: [name] OB: 06-Jan-1956  MR#: [id number]  KZS#:[id number]  PCP: [name], MD GYN:   SU: [name] MD OTHER MD:   CHIEF COMPLAINT: 58 y/o woman with inflammatory breast cancer undergoing adjuvant chemotherapy  BREAST CANCER HISTORY: As per previously dictated Dr.Magrinat's note:  Ms. [name] presented to the emergency room on 10/22/2013 complaining of right breast pain and discharge. On exam the breast was diffusely erythematous with" orange peel" appearance. She was admitted for debridement and surgery he describes a skin defect in the lower inner quadrant measuring approximately 4 cm. When the breast was breast, air came out through the hole--there are photos included with the admission note. A chest x-ray was obtained which showed the right breast to be asymmetrically enlarged with extensive subcutaneous gas.  On the same day the patient underwent debridement of the wound, with the pathology (SZA [id number]) showing high-grade carcinoma involving ulcerated skin. There was tumor within the dermal and subcutaneous lymphatics. The tumor was negative for estrogen, progesterone, and HER-2(signals ratio 1.24, number per cell 3.6), with an MIB-1 of 95%. Gross cystic disease fluid protein was also negative.  The patient was admitted and started on antibiotics. On 10/24/2013 an attempt was made at further debridement, but the inside of the breast essentially came out in hand falls. The pathology from this procedure(SZA 15-20 and 26) again showed high-grade carcinoma.  On 10/25/2013 the patient underwent CT scans of the chest abdomen and pelvis. Aside from disease in the breast and right axilla there was no evidence of metastatic disease.  Accordingly Dr. [name] proceeded to right mastectomy with axillary lymph node dissection 10/26/2013. Results of this procedure ([id number]) showed a 15  cm defect in the middle of the breast, which is 4 prior necrotic tissue had been scoped out. There was nevertheless extensive invasive ductal carcinoma, grade 3, in the specimen and repeat or blastic panel was again triple negative. 2 of 16 axillary lymph nodes were involved by macrometastatic deposits. The patient was continued on antibiotics and eventually discharged 11/03/2013.  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  This is given on day 1 of a 14 day cycle, with Neulasta given on day 2 for granulocyte support.  She is currently cycle 3 day 1 of therapy.  Carmen Stone is doing well today.  She is requesting several refills of medications.  One of these medications is for Percocet.  She uses the percocet for pain underneath her right arm.  This pain is worsened with movement of her right arm upwards and she feels pain and a pins and needles sensation that has been ongoing since surgery.  She did recently f/u with Dr. [name] and have her two remaining sutures removed.  She denies fevers, chills, nausea, vomiting, constipation, diarrhea, numbness/tingling, nail changes, skin changes, mouth pain, or any further concerns.     REVIEW OF SYSTEMS: A 10 point review of systems is been assessed and pertinent symptoms as mentioned in interval history   PAST MEDICAL HISTORY: Past Medical History  Diagnosis Date  . Diabetes mellitus, type II   . Inflammatory breast cancer     PAST SURGICAL HISTORY: Past Surgical History  Procedure Laterality Date  . Cesarean  section    . Ventral hernia repair  2011    repair incacerated VH with biologic mesh; cholecystectomy  . Cholecystectomy  2011  . Incision and drainage of wound Right 10/22/2013    Procedure:  IRRIGATION AND DRAINAGE AND DEBRIDEMENT RIGHT BREAST WOUND;  Surgeon: [name], MD;  Location: [facility];  Service: General;  Laterality: Right;  . Breast biopsy Right 10/22/2013    Procedure: BREAST BIOPSY;  Surgeon: [name], MD;  Location: [facility];  Service: General;  Laterality: Right;  . Irrigation and debridement abscess Right 10/24/2013    Procedure: Right total mastectomy;  Surgeon: [name], MD;  Location: [facility];  Service: General;  Laterality: Right;  . Dressing change under anesthesia Right 10/26/2013    Procedure: COMPLETION MASTECTOMY, AXILLARY DISSECTION;  Surgeon: [name], MD;  Location: [facility];  Service: General;  Laterality: Right;  . Portacath placement N/A 11/16/2013    Procedure: INSERTION PORT-A-CATH;  Surgeon: [name], MD;  Location: [facility];  Service: General;  Laterality: N/A;    FAMILY HISTORY No family history on file. The patient's father was a heavy smoker and died from complications of emphysema at the age of 52. The patient's mother died from lung cancer at the age of 72. She also was a heavy smoker. The patient had one brother, no sisters. There is no history of breast or ovarian cancer in the family.  GYNECOLOGIC HISTORY:  Menarche age 34, first live birth age 76, the patient is GX P1. She went through the change of life approximately age 37. She did not take hormone replacement.  SOCIAL HISTORY:  Bennie is divorced. She works for [employer] and also works a second job at [employer] on [employer]. She plans to be on disability during her chemoradiation treatments. She lives alone, but her daughter [name] frequently stays there overnight. [name] was [employer]. 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 She spends her time between helping her mom and helping her grandfather, Asley Baskerville, who lives in [place] and has significant dementia. Mr. Murton was present at the 11/18/2013 visit only  because [name] wanted to be present and otherwise no one would have been with Mr. Teigen. Jann has no grandchildren. She attends a [employer] adult church    ADVANCED DIRECTIVES: In place. The patient has named her brother Clide Cliff as her healthcare power of attorney. Iona Beard can be reached at [phone]. He is a Pharmacist, hospital locally  HEALTH MAINTENANCE: History  Substance Use Topics  . Smoking status: Never Smoker   . Smokeless tobacco: Not on file  . Alcohol Use: No     Colonoscopy:  PAP:  Bone density:  Lipid panel:  No Known Allergies  Current Outpatient Prescriptions  Medication Sig Dispense Refill  . glucose blood test strip Use as instructed  100 each  12  . glucose monitoring kit (FREESTYLE) monitoring kit 1 each by Does not apply route as needed for other. Check blood sugars daily.  1 each  0  . Insulin Glargine (LANTUS SOLOSTAR) 100 UNIT/ML Solostar Pen Inject 10 Units into the skin daily at 10 pm.  15 mL  3  . lidocaine-prilocaine (EMLA) cream Apply 1 application topically as needed. Apply over port area 1-2 hours before treatment, cover with plastic wrap  30 g  0  . linagliptin (TRADJENTA) 5 MG TABS tablet Take 1 tablet (5 mg total) by mouth daily.  30 tablet  0  . acetaminophen (TYLENOL)  325 MG tablet Take 650 mg by mouth every 6 (six) hours as needed for mild pain.      Marland Kitchen dexamethasone (DECADRON) 4 MG tablet Take 2 tablets by mouth once a day on the day after chemotherapy and then take 2 tablets two times a day for 2 days. Take with food.  30 tablet  1  . LORazepam (ATIVAN) 0.5 MG tablet Take 1 tablet (0.5 mg total) by mouth at bedtime as needed (Nausea or vomiting).  30 tablet  0  . ondansetron (ZOFRAN) 8 MG tablet Take 1 tablet (8 mg total) by mouth every 8 (eight) hours as needed for nausea or vomiting.  20 tablet  3  . oxyCODONE-acetaminophen (PERCOCET) 10-325 MG per tablet Take 1 tablet by mouth every 6 (six) hours as needed for pain.  30 tablet  0  . pantoprazole  (PROTONIX) 40 MG tablet Take 1 tablet (40 mg total) by mouth daily at 6 (six) AM.  30 tablet  3  . prochlorperazine (COMPAZINE) 10 MG tablet Take 1 tablet (10 mg total) by mouth every 6 (six) hours as needed (Nausea or vomiting).  30 tablet  1  . traMADol (ULTRAM) 50 MG tablet Take 1 tablet (50 mg total) by mouth every 6 (six) hours as needed.  20 tablet  0   No current facility-administered medications for this visit.   Facility-Administered Medications Ordered in Other Visits  Medication Dose Route Frequency Provider Last Rate Last Dose  . sodium chloride 0.9 % injection 10 mL  10 mL Intracatheter PRN Chauncey Cruel, MD   10 mL at 12/26/13 1252    OBJECTIVE: Middle-aged [place] American woman in no acute distress Filed Vitals:   12/26/13 0918  BP: 139/68  Pulse: 111  Temp: 98.5 F (36.9 C)  Resp: 20     Body mass index is 28.93 kg/(m^2).    ECOG FS:1 - Symptomatic but completely ambulatory  GENERAL: Patient is a well appearing female in no acute distress HEENT:  Sclerae anicteric.  Oropharynx clear and moist. No ulcerations or evidence of oropharyngeal candidiasis. Neck is supple.  NODES:  No cervical, supraclavicular, or axillary lymphadenopathy palpated.  BREAST EXAM:  Right mastectomy site well healed stitches have been removed.   LUNGS:  Clear to auscultation bilaterally.  No wheezes or rhonchi. HEART:  Regular rate and rhythm. No murmur appreciated. ABDOMEN:  Soft, nontender.  Positive, normoactive bowel sounds. No organomegaly palpated. MSK:  No focal spinal tenderness to palpation. Full range of motion bilaterally in the upper extremities. EXTREMITIES:  No peripheral edema.  Right axilla and right arm specifically examined, without any swelling.   SKIN:  Clear with no obvious rashes or skin changes. No nail dyscrasia.  Port in left chest wall without erythema or tenderness.   NEURO:  Nonfocal. Well oriented.  Appropriate affect.    LAB RESULTS:  CMP     Component  Value Date/Time   NA 138 12/26/2013 0905   NA 142 11/16/2013 0847   K 4.5 12/26/2013 0905   K 4.1 11/16/2013 0847   CL 106 11/16/2013 0847   CO2 26 12/26/2013 0905   CO2 27 11/07/2013 0540   GLUCOSE 155* 12/26/2013 0905   GLUCOSE 149* 11/16/2013 0847   BUN 19.2 12/26/2013 0905   BUN 25* 11/16/2013 0847   CREATININE 0.8 12/26/2013 0905   CREATININE 1.20* 11/16/2013 0847   CALCIUM 9.6 12/26/2013 0905   CALCIUM 8.8 11/07/2013 0540   PROT 6.7 12/26/2013 0905   PROT  6.5 10/30/2013 1014   ALBUMIN 3.6 12/26/2013 0905   ALBUMIN 2.2* 10/30/2013 1014   AST 11 12/26/2013 0905   AST 20 10/30/2013 1014   ALT 16 12/26/2013 0905   ALT 6 10/30/2013 1014   ALKPHOS 102 12/26/2013 0905   ALKPHOS 70 10/30/2013 1014   BILITOT <0.20 12/26/2013 0905   BILITOT <0.2* 10/30/2013 1014   GFRNONAA 37* 11/07/2013 0540   GFRAA 43* 11/07/2013 0540    I No results found for this basename: SPEP,  UPEP,   kappa and lambda light chains    Lab Results  Component Value Date   WBC 17.7* 12/26/2013   NEUTROABS 15.0* 12/26/2013   HGB 10.2* 12/26/2013   HCT 31.4* 12/26/2013   MCV 91.2 12/26/2013   PLT 286 12/26/2013      Chemistry      Component Value Date/Time   NA 138 12/26/2013 0905   NA 142 11/16/2013 0847   K 4.5 12/26/2013 0905   K 4.1 11/16/2013 0847   CL 106 11/16/2013 0847   CO2 26 12/26/2013 0905   CO2 27 11/07/2013 0540   BUN 19.2 12/26/2013 0905   BUN 25* 11/16/2013 0847   CREATININE 0.8 12/26/2013 0905   CREATININE 1.20* 11/16/2013 0847      Component Value Date/Time   CALCIUM 9.6 12/26/2013 0905   CALCIUM 8.8 11/07/2013 0540   ALKPHOS 102 12/26/2013 0905   ALKPHOS 70 10/30/2013 1014   AST 11 12/26/2013 0905   AST 20 10/30/2013 1014   ALT 16 12/26/2013 0905   ALT 6 10/30/2013 1014   BILITOT <0.20 12/26/2013 0905   BILITOT <0.2* 10/30/2013 1014       No results found for this basename: LABCA2    No components found with this basename: LABCA125    No results found for this basename: INR,  in the last 168 hours  Urinalysis      Component Value Date/Time   COLORURINE ORANGE* 10/22/2013 0159   APPEARANCEUR CLOUDY* 10/22/2013 0159   LABSPEC 1.043* 10/22/2013 0159   PHURINE 5.5 10/22/2013 0159   GLUCOSEU >1000* 10/22/2013 0159   HGBUR MODERATE* 10/22/2013 0159   BILIRUBINUR MODERATE* 10/22/2013 0159   KETONESUR 15* 10/22/2013 0159   PROTEINUR 100* 10/22/2013 0159   UROBILINOGEN 2.0* 10/22/2013 0159   NITRITE NEGATIVE 10/22/2013 0159   LEUKOCYTESUR SMALL* 10/22/2013 0159    STUDIES: Dg Chest 2 View  10/22/2013   CLINICAL DATA:  Right breast pain and swelling.  Fever.  EXAM: CHEST  2 VIEW  COMPARISON:  06/13/2010  FINDINGS: The right breast is asymmetrically enlarged with extensive subcutaneous gas. This creates increased density over the right chest. There is no evidence of pneumonia in the lateral projection. No cardiomegaly. No edema, effusion, or pneumothorax.  Critical Value/emergent results were called by telephone at the time of interpretation on 10/22/2013 at 4:17 AM to Dr. Kathrynn Humble , who verbally acknowledged these results.  IMPRESSION: Right breast swelling with extensive subcutaneous gas. If no recent intervention, findings concerning for necrotizing infection.   Electronically Signed   By: Jorje Guild M.D.   On: 10/22/2013 04:17   Ct Chest W Contrast  10/26/2013   CLINICAL DATA:  58 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 Evaluate for metastatic disease.  EXAM: CT CHEST, ABDOMEN, AND PELVIS WITH CONTRAST  TECHNIQUE: Multidetector CT imaging of the chest, abdomen and pelvis was performed following the standard protocol during bolus administration of intravenous contrast.  CONTRAST:  167m OMNIPAQUE IOHEXOL 300 MG/ML  SOLN  COMPARISON:  CT of abdomen pelvis 06/21/2010.  FINDINGS: CT CHEST FINDINGS  Mediastinum: Heart size is normal. There is no significant pericardial fluid, thickening or pericardial calcification. No pathologically enlarged mediastinal, bilateral hilar or internal mammary lymph nodes. Esophagus is  unremarkable in appearance.  Lungs/Pleura: Trace bilateral pleural effusions layering dependently with some mild dependent subsegmental atelectasis in the lower lobes of the lungs bilaterally. No acute consolidative airspace disease. No suspicious appearing pulmonary nodules or masses.  Musculoskeletal: Postoperative changes in the right breast are noted, with a large open wound filled with packing material. The periphery of the open wound demonstrates a thick rind of soft tissue, some of which appears to enhance. In the medial aspect of the right breast (image 27 of series 2) there is a 1.5 x 1.3 cm enhancing soft tissue lesion, suspicious for a focus of residual tumor or a malignant lymph node. Numerous borderline enlarged and mildly enlarged right axillary lymph nodes are noted, measuring up to 12 mm in short axis. There are no aggressive appearing lytic or blastic lesions noted in the visualized portions of the skeleton.  CT ABDOMEN AND PELVIS FINDINGS  Abdomen/Pelvis: Several well-defined low-attenuation lesions in the liver appear unchanged in size, number and pattern of distribution compared to prior study 2012, presumably small cysts. The largest of these lesions measures 2.1 cm in segment 4A. No other suspicious hepatic lesions are noted. Status post cholecystectomy. The appearance of the pancreas, spleen, bilateral adrenal glands and bilateral kidneys is unremarkable.  Normal appendix (retrocecal in position). No significant volume of ascites. No pneumoperitoneum. No pathologic distention of small bowel. There are a few colonic diverticulae, particularly in the region of the sigmoid colon, without surrounding inflammatory changes to suggest an acute diverticulitis at this time. Several small ventral hernias are noted, containing several loops of small bowel and a short segment of the mid transverse colon, without evidence of bowel incarceration or obstruction at this time. No lymphadenopathy identified  within the abdomen or pelvis. Status post hysterectomy. Ovaries are not confidently identified may be surgically absent or atrophic.  Musculoskeletal: There are no aggressive appearing lytic or blastic lesions noted in the visualized portions of the skeleton. Edema throughout the fat of the right flank, presumably reactive to the process in the right breast.  IMPRESSION: 1. Postoperative changes of recent right breast surgery with packed open wound which has a thick rind of soft tissue, some of which enhances, a 1.3 x 1.5 cm enhancing lesion in the medial aspect of the right breast which may represent residual tumor or a malignant lymph node, and multiple enlarged right axillary nodes concerning for axillary nodal metastases. 2. No other definite signs of metastatic disease in the chest, abdomen or pelvis. 3. Multiple small ventral hernias containing several loops of small bowel, as well as the mid transverse colon, without associated bowel incarceration or obstruction at this time. 4. Trace bilateral pleural effusions with mild dependent subsegmental atelectasis in the lower lobes of the lungs bilaterally. 5. Status post cholecystectomy and hysterectomy. 6. Additional incidental findings, as above.   Electronically Signed   By: Vinnie Langton M.D.   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 Evaluate for metastatic disease.  EXAM: CT CHEST, ABDOMEN, AND PELVIS WITH CONTRAST  TECHNIQUE: Multidetector CT imaging of the chest, abdomen and pelvis was performed following the standard protocol during bolus administration of intravenous contrast.  CONTRAST:  173m OMNIPAQUE IOHEXOL 300 MG/ML  SOLN  COMPARISON:  CT of abdomen pelvis 06/21/2010.  FINDINGS: CT CHEST FINDINGS  Mediastinum: Heart size is normal. There is no significant pericardial fluid, thickening or pericardial calcification. No pathologically enlarged  mediastinal, bilateral hilar or internal mammary lymph nodes. Esophagus is unremarkable in appearance.  Lungs/Pleura: Trace bilateral pleural effusions layering dependently with some mild dependent subsegmental atelectasis in the lower lobes of the lungs bilaterally. No acute consolidative airspace disease. No suspicious appearing pulmonary nodules or masses.  Musculoskeletal: Postoperative changes in the right breast are noted, with a large open wound filled with packing material. The periphery of the open wound demonstrates a thick rind of soft tissue, some of which appears to enhance. In the medial aspect of the right breast (image 27 of series 2) there is a 1.5 x 1.3 cm enhancing soft tissue lesion, suspicious for a focus of residual tumor or a malignant lymph node. Numerous borderline enlarged and mildly enlarged right axillary lymph nodes are noted, measuring up to 12 mm in short axis. There are no aggressive appearing lytic or blastic lesions noted in the visualized portions of the skeleton.  CT ABDOMEN AND PELVIS FINDINGS  Abdomen/Pelvis: Several well-defined low-attenuation lesions in the liver appear unchanged in size, number and pattern of distribution compared to prior study 2012, presumably small cysts. The largest of these lesions measures 2.1 cm in segment 4A. No other suspicious hepatic lesions are noted. Status post cholecystectomy. The appearance of the pancreas, spleen, bilateral adrenal glands and bilateral kidneys is unremarkable.  Normal appendix (retrocecal in position). No significant volume of ascites. No pneumoperitoneum. No pathologic distention of small bowel. There are a few colonic diverticulae, particularly in the region of the sigmoid colon, without surrounding inflammatory changes to suggest an acute diverticulitis at this time. Several small ventral hernias are noted, containing several loops of small bowel and a short segment of the mid transverse colon, without evidence of bowel  incarceration or obstruction at this time. No lymphadenopathy identified within the abdomen or pelvis. Status post hysterectomy. Ovaries are not confidently identified may be surgically absent or atrophic.  Musculoskeletal: There are no aggressive appearing lytic or blastic lesions noted in the visualized portions of the skeleton. Edema throughout the fat of the right flank, presumably reactive to the process in the right breast.  IMPRESSION: 1. Postoperative changes of recent right breast surgery with packed open wound which has a thick rind of soft tissue, some of which enhances, a 1.3 x 1.5 cm enhancing lesion in the medial aspect of the right breast which may represent residual tumor or a malignant lymph node, and multiple enlarged right axillary nodes concerning for axillary nodal metastases. 2. No other definite signs of metastatic disease in the chest, abdomen or pelvis. 3. Multiple small ventral hernias containing several loops of small bowel, as well as the mid transverse colon, without associated bowel incarceration or obstruction at this time. 4. Trace bilateral pleural effusions with mild dependent subsegmental atelectasis in the lower lobes of the lungs bilaterally. 5. Status post cholecystectomy and hysterectomy. 6. Additional incidental findings, as above.   Electronically Signed   By: DVinnie LangtonM.D.   On: 10/26/2013 08:35   Dg Chest Portable 1 View  11/16/2013   CLINICAL DATA:  Central catheter placement  EXAM: PORTABLE CHEST - 1 VIEW  COMPARISON:  Chest radiograph Oct 22, 2013 and chest CT Oct 25, 2013  FINDINGS: Port-A-Cath tip is in the superior vena cava. No  pneumothorax. There is mild left base atelectasis. Elsewhere lungs are clear. Heart size and pulmonary vascularity are normal. No adenopathy. There is postoperative change on the right with clips in the right axilla.  IMPRESSION: Port-A-Cath tip in superior vena cava. No pneumothorax. Mild left base atelectasis. Lungs otherwise clear.    Electronically Signed   By: Lowella Grip M.D.   On: 11/16/2013 13:58   Dg Fluoro Guide Cv Line-no Report  11/16/2013   CLINICAL DATA: PAC   FLOURO GUIDE CV LINE  Fluoroscopy was utilized by the requesting physician.  No radiographic  interpretation.     ASSESSMENT: 58 y.o. [place] woman  #1. stage IIIA  (pT3 pN1) invasive ductal carcinoma, grade 3, triple negative, with an MIB-1 of 95%.status post right modified radical mastectomy 10/26/2013  #2.  chemotherapy with dose dense Adriamycin and cyclophosphamide x 4 cycles, start date: 11/28/2013  #3. Followed by carboplatin and paclitaxel weekly x12, start date:   #4  postmastectomy radiation to follow after completion of chemotherapy.  PLAN:  Shamirah is doing well today.  Her CBC is stable today.  She will proceed with her third cycle of Doxorubicin and Cyclophosphamide. I recommended she continue to moisturize her hands and feet with Aquaphor.  Her CMP is pending.    I refilled her Percocet x 1 today.  This is post-surgical pain.  I discussed her use of Percocet today, and instructed her that she should start doing range of motion exercises with that right arm.  Should she continue to have pain and need further pain medication, I recommended she f/u with Dr. [name].  I also offered the patient a referral to physical therapy.  She is interested, however does have transportation difficulties.  She will try to work out the logistics and will f/u with me next week on if she was able to get anything arranged.  The patient was in agreement.    I refilled her Pantoprazole and Dexamethasone today as well.    Misha will return tomorrow for Neulasta, and in one week for labs and evaluation of any chemotoxicities.  She is in agreement of the above plan.   She knows to call us in the interim for any questions or concerns.  We can certainly see her sooner if needed.  I spent 25 minutes counseling the patient face to face.  The total time  spent in the appointment was 30 minutes.   Minette Headland, [place] [phone] 12/26/2013 3:37 PM

## 2013-12-27 ENCOUNTER — Ambulatory Visit (HOSPITAL_BASED_OUTPATIENT_CLINIC_OR_DEPARTMENT_OTHER): Payer: BC Managed Care – PPO

## 2013-12-27 VITALS — BP 130/57 | HR 97 | Temp 98.2°F

## 2013-12-27 DIAGNOSIS — Z5189 Encounter for other specified aftercare: Secondary | ICD-10-CM

## 2013-12-27 DIAGNOSIS — C773 Secondary and unspecified malignant neoplasm of axilla and upper limb lymph nodes: Secondary | ICD-10-CM

## 2013-12-27 DIAGNOSIS — C50919 Malignant neoplasm of unspecified site of unspecified female breast: Secondary | ICD-10-CM

## 2013-12-27 MED ORDER — PEGFILGRASTIM INJECTION 6 MG/0.6ML
6.0000 mg | Freq: Once | SUBCUTANEOUS | Status: AC
  Administered 2013-12-27: 6 mg via SUBCUTANEOUS
  Filled 2013-12-27: qty 0.6

## 2014-01-02 ENCOUNTER — Encounter: Payer: Self-pay | Admitting: Adult Health

## 2014-01-02 ENCOUNTER — Other Ambulatory Visit (HOSPITAL_BASED_OUTPATIENT_CLINIC_OR_DEPARTMENT_OTHER): Payer: BC Managed Care – PPO

## 2014-01-02 ENCOUNTER — Ambulatory Visit (HOSPITAL_BASED_OUTPATIENT_CLINIC_OR_DEPARTMENT_OTHER): Payer: BC Managed Care – PPO | Admitting: Adult Health

## 2014-01-02 VITALS — BP 136/66 | HR 105 | Temp 98.5°F | Resp 20 | Ht 64.0 in | Wt 169.4 lb

## 2014-01-02 DIAGNOSIS — R0902 Hypoxemia: Secondary | ICD-10-CM

## 2014-01-02 DIAGNOSIS — E119 Type 2 diabetes mellitus without complications: Secondary | ICD-10-CM

## 2014-01-02 DIAGNOSIS — C50919 Malignant neoplasm of unspecified site of unspecified female breast: Secondary | ICD-10-CM

## 2014-01-02 DIAGNOSIS — D649 Anemia, unspecified: Secondary | ICD-10-CM

## 2014-01-02 DIAGNOSIS — N611 Abscess of the breast and nipple: Secondary | ICD-10-CM

## 2014-01-02 DIAGNOSIS — Z171 Estrogen receptor negative status [ER-]: Secondary | ICD-10-CM

## 2014-01-02 DIAGNOSIS — C773 Secondary and unspecified malignant neoplasm of axilla and upper limb lymph nodes: Secondary | ICD-10-CM

## 2014-01-02 LAB — COMPREHENSIVE METABOLIC PANEL (CC13)
ALK PHOS: 114 U/L (ref 40–150)
ALT: 11 U/L (ref 0–55)
AST: 10 U/L (ref 5–34)
Albumin: 3.3 g/dL — ABNORMAL LOW (ref 3.5–5.0)
Anion Gap: 8 mEq/L (ref 3–11)
BILIRUBIN TOTAL: 0.2 mg/dL (ref 0.20–1.20)
BUN: 12.2 mg/dL (ref 7.0–26.0)
CO2: 26 mEq/L (ref 22–29)
CREATININE: 0.7 mg/dL (ref 0.6–1.1)
Calcium: 8.7 mg/dL (ref 8.4–10.4)
Chloride: 105 mEq/L (ref 98–109)
Glucose: 212 mg/dl — ABNORMAL HIGH (ref 70–140)
Potassium: 3.4 mEq/L — ABNORMAL LOW (ref 3.5–5.1)
SODIUM: 138 meq/L (ref 136–145)
Total Protein: 5.8 g/dL — ABNORMAL LOW (ref 6.4–8.3)

## 2014-01-02 LAB — CBC WITH DIFFERENTIAL/PLATELET
BASO%: 2.9 % — ABNORMAL HIGH (ref 0.0–2.0)
BASOS ABS: 0.2 10*3/uL — AB (ref 0.0–0.1)
EOS%: 0.2 % (ref 0.0–7.0)
Eosinophils Absolute: 0 10*3/uL (ref 0.0–0.5)
HCT: 26 % — ABNORMAL LOW (ref 34.8–46.6)
HGB: 8.4 g/dL — ABNORMAL LOW (ref 11.6–15.9)
LYMPH%: 11.7 % — AB (ref 14.0–49.7)
MCH: 29.3 pg (ref 25.1–34.0)
MCHC: 32.3 g/dL (ref 31.5–36.0)
MCV: 90.7 fL (ref 79.5–101.0)
MONO#: 0.5 10*3/uL (ref 0.1–0.9)
MONO%: 6.6 % (ref 0.0–14.0)
NEUT#: 5.4 10*3/uL (ref 1.5–6.5)
NEUT%: 78.6 % — ABNORMAL HIGH (ref 38.4–76.8)
PLATELETS: 184 10*3/uL (ref 145–400)
RBC: 2.87 10*6/uL — AB (ref 3.70–5.45)
RDW: 16.4 % — ABNORMAL HIGH (ref 11.2–14.5)
WBC: 6.9 10*3/uL (ref 3.9–10.3)
lymph#: 0.8 10*3/uL — ABNORMAL LOW (ref 0.9–3.3)

## 2014-01-02 NOTE — Progress Notes (Signed)
Carmen Stone  [phone] Fax:([phone]     ID: Carmen Stone OB: 1956-03-15  MR#: [id number]  UGA#:[id number]  PCP: Carmen Palau, MD GYN:   SU: Carmen Bookbinder MD OTHER MD:   CHIEF COMPLAINT: 58 y/o woman with inflammatory breast cancer undergoing adjuvant chemotherapy  BREAST CANCER HISTORY: As per previously dictated Dr.Magrinat's note:  Carmen Stone presented to the emergency room on 10/22/2013 complaining of right breast pain and discharge. On exam the breast was diffusely erythematous with" orange peel" appearance. She was admitted for debridement and surgery he describes a skin defect in the lower inner quadrant measuring approximately 4 cm. When the breast was breast, air came out through the hole--there are photos included with the admission note. A chest x-ray was obtained which showed the right breast to be asymmetrically enlarged with extensive subcutaneous gas.  On the same day the patient underwent debridement of the wound, with the pathology (SZA [id number]) showing high-grade carcinoma involving ulcerated skin. There was tumor within the dermal and subcutaneous lymphatics. The tumor was negative for estrogen, progesterone, and HER-2(signals ratio 1.24, number per cell 3.6), with an MIB-1 of 95%. Gross cystic disease fluid protein was also negative.  The patient was admitted and started on antibiotics. On 10/24/2013 an attempt was made at further debridement, but the inside of the breast essentially came out in hand falls. The pathology from this procedure(SZA 15-20 and 26) again showed high-grade carcinoma.  On 10/25/2013 the patient underwent CT scans of the chest abdomen and pelvis. Aside from disease in the breast and right axilla there was no evidence of metastatic disease.  Accordingly Dr. Donne Stone proceeded to right mastectomy with axillary lymph node dissection 10/26/2013. Results of this procedure ([id number]) showed a 15  cm defect in the middle of the breast, which is 4 prior necrotic tissue had been scoped out. There was nevertheless extensive invasive ductal carcinoma, grade 3, in the specimen and repeat or blastic panel was again triple negative. 2 of 16 axillary lymph nodes were involved by macrometastatic deposits. The patient was continued on antibiotics and eventually discharged 11/03/2013.  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  This is given on day 1 of a 14 day cycle, with Neulasta given on day 2 for granulocyte support.  She is currently cycle 3 day 8 of therapy.  Carmen Stone is here for follow up after receiving her third cycle of Doxorubicin and Cyclophosphamide chemotherapy.  She is tolerating it moderately well.  She did have some more constipation than usual which has now resolved.  Otherwise she denies fever, chills, nausea, vomiting, diarrhea, numbness/tingling, dyspnea, DOE, orthopnea, palpitations, or any further concerns.  She has not taking any of the percocet prescribed for her post-surgical pain as she tells me that this is largely resolved.  She is also doing well with the range of motion in her right arm and has not made an appt with PT due to her improved progress.   REVIEW OF SYSTEMS: A 10 point review of systems is been assessed and pertinent symptoms as mentioned in interval history   PAST MEDICAL HISTORY: Past Medical History  Diagnosis Date  . Diabetes mellitus, type II   . Inflammatory breast cancer     PAST SURGICAL HISTORY: Past Surgical History  Procedure  Laterality Date  . Cesarean section    . Ventral hernia repair  2011    repair incacerated VH with biologic mesh; cholecystectomy  . Cholecystectomy  2011  . Incision and drainage of wound  Right 10/22/2013    Procedure: IRRIGATION AND DRAINAGE AND DEBRIDEMENT RIGHT BREAST WOUND;  Surgeon: Carmen Curry, MD;  Location: [facility];  Service: General;  Laterality: Right;  . Breast biopsy Right 10/22/2013    Procedure: BREAST BIOPSY;  Surgeon: Carmen Curry, MD;  Location: [facility];  Service: General;  Laterality: Right;  . Irrigation and debridement abscess Right 10/24/2013    Procedure: Right total mastectomy;  Surgeon: Carmen Bookbinder, MD;  Location: [facility];  Service: General;  Laterality: Right;  . Dressing change under anesthesia Right 10/26/2013    Procedure: COMPLETION MASTECTOMY, AXILLARY DISSECTION;  Surgeon: Carmen Bookbinder, MD;  Location: [facility];  Service: General;  Laterality: Right;  . Portacath placement N/A 11/16/2013    Procedure: INSERTION PORT-A-CATH;  Surgeon: Carmen Bookbinder, MD;  Location: [facility];  Service: General;  Laterality: N/A;    FAMILY HISTORY No family history on file. The patient's father was a heavy smoker and died from complications of emphysema at the age of 31. The patient's mother died from lung cancer at the age of 20. She also was a heavy smoker. The patient had one brother, no sisters. There is no history of breast or ovarian cancer in the family.  GYNECOLOGIC HISTORY:  Menarche age 51, first live birth age 72, the patient is GX P1. She went through the change of life approximately age 57. She did not take hormone replacement.  SOCIAL HISTORY:  Carmen Stone is divorced. She works for [employer] and also works a second job at [employer] on [employer]. She plans to be on disability during her chemoradiation treatments. She lives alone, but her daughter Carmen Stone frequently stays there overnight. 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 She spends her time between helping her mom and helping her grandfather, Carmen Stone, who lives in [place] and has significant dementia. Carmen Stone was present at  the 11/18/2013 visit only because Carmen Stone wanted to be present and otherwise no one would have been with Carmen Stone. Carmen Stone has no grandchildren. She attends a [employer] church    ADVANCED DIRECTIVES: In place. The patient has named her brother Clide Cliff as her healthcare power of attorney. Iona Beard can be reached at [phone]. He is a Pharmacist, hospital locally  HEALTH MAINTENANCE: History  Substance Use Topics  . Smoking status: Never Smoker   . Smokeless tobacco: Not on file  . Alcohol Use: No     Colonoscopy:  PAP:  Bone density:  Lipid panel:  No Known Allergies  Current Outpatient Prescriptions  Medication Sig Dispense Refill  . glucose blood test strip Use as instructed  100 each  12  . glucose monitoring kit (FREESTYLE) monitoring kit 1 each by Does not apply route as needed for other. Check blood sugars daily.  1 each  0  . Insulin Glargine (LANTUS SOLOSTAR) 100 UNIT/ML Solostar Pen Inject 10 Units into the skin daily at 10 pm.  15 mL  3  . linagliptin (TRADJENTA) 5 MG TABS tablet Take 1 tablet (5 mg total) by mouth daily.  30 tablet  0  . oxyCODONE-acetaminophen (PERCOCET) 10-325 MG per tablet Take 1 tablet by mouth every 6 (six) hours as needed for pain.  30 tablet  0  .  pantoprazole (PROTONIX) 40 MG tablet Take 1 tablet (40 mg total) by mouth daily at 6 (six) AM.  30 tablet  3  . traMADol (ULTRAM) 50 MG tablet Take 1 tablet (50 mg total) by mouth every 6 (six) hours as needed.  20 tablet  0  . acetaminophen (TYLENOL) 325 MG tablet Take 650 mg by mouth every 6 (six) hours as needed for mild pain.      Marland Kitchen dexamethasone (DECADRON) 4 MG tablet Take 2 tablets by mouth once a day on the day after chemotherapy and then take 2 tablets two times a day for 2 days. Take with food.  30 tablet  1  . lidocaine-prilocaine (EMLA) cream Apply 1 application topically as needed. Apply over port area 1-2 hours before treatment, cover with plastic wrap  30 g  0  . LORazepam (ATIVAN) 0.5 MG tablet  Take 1 tablet (0.5 mg total) by mouth at bedtime as needed (Nausea or vomiting).  30 tablet  0  . ondansetron (ZOFRAN) 8 MG tablet Take 1 tablet (8 mg total) by mouth every 8 (eight) hours as needed for nausea or vomiting.  20 tablet  3  . prochlorperazine (COMPAZINE) 10 MG tablet Take 1 tablet (10 mg total) by mouth every 6 (six) hours as needed (Nausea or vomiting).  30 tablet  1   No current facility-administered medications for this visit.    OBJECTIVE: Middle-aged [place] American woman in no acute distress Filed Vitals:   01/02/14 1514  BP: 136/66  Pulse: 105  Temp: 98.5 F (36.9 C)  Resp: 20     Body mass index is 29.06 kg/(m^2).    ECOG FS:1 - Symptomatic but completely ambulatory  GENERAL: Patient is a well appearing female in no acute distress HEENT:  Sclerae anicteric.  Oropharynx clear and moist. No ulcerations or evidence of oropharyngeal candidiasis. Neck is supple.  NODES:  No cervical, supraclavicular, or axillary lymphadenopathy palpated.  BREAST EXAM:  deferred LUNGS:  Clear to auscultation bilaterally.  No wheezes or rhonchi. HEART:  Regular rate and rhythm. No murmur appreciated. ABDOMEN:  Soft, nontender.  Positive, normoactive bowel sounds. No organomegaly palpated. MSK:  No focal spinal tenderness to palpation. Full range of motion bilaterally in the upper extremities. EXTREMITIES:  No peripheral edema.  Right axilla and right arm specifically examined, without any swelling.   SKIN:  Clear with no obvious rashes or skin changes. No nail dyscrasia.  Port in left chest wall without erythema or tenderness.   NEURO:  Nonfocal. Well oriented.  Appropriate affect.    LAB RESULTS:  CMP     Component Value Date/Time   NA 138 12/26/2013 0905   NA 142 11/16/2013 0847   K 4.5 12/26/2013 0905   K 4.1 11/16/2013 0847   CL 106 11/16/2013 0847   CO2 26 12/26/2013 0905   CO2 27 11/07/2013 0540   GLUCOSE 155* 12/26/2013 0905   GLUCOSE 149* 11/16/2013 0847   BUN 19.2 12/26/2013  0905   BUN 25* 11/16/2013 0847   CREATININE 0.8 12/26/2013 0905   CREATININE 1.20* 11/16/2013 0847   CALCIUM 9.6 12/26/2013 0905   CALCIUM 8.8 11/07/2013 0540   PROT 6.7 12/26/2013 0905   PROT 6.5 10/30/2013 1014   ALBUMIN 3.6 12/26/2013 0905   ALBUMIN 2.2* 10/30/2013 1014   AST 11 12/26/2013 0905   AST 20 10/30/2013 1014   ALT 16 12/26/2013 0905   ALT 6 10/30/2013 1014   ALKPHOS 102 12/26/2013 0905   ALKPHOS 70  10/30/2013 1014   BILITOT <0.20 12/26/2013 0905   BILITOT <0.2* 10/30/2013 1014   GFRNONAA 37* 11/07/2013 0540   GFRAA 43* 11/07/2013 0540    I No results found for this basename: SPEP,  UPEP,   kappa and lambda light chains    Lab Results  Component Value Date   WBC 6.9 01/02/2014   NEUTROABS 5.4 01/02/2014   HGB 8.4* 01/02/2014   HCT 26.0* 01/02/2014   MCV 90.7 01/02/2014   PLT 184 01/02/2014      Chemistry      Component Value Date/Time   NA 138 12/26/2013 0905   NA 142 11/16/2013 0847   K 4.5 12/26/2013 0905   K 4.1 11/16/2013 0847   CL 106 11/16/2013 0847   CO2 26 12/26/2013 0905   CO2 27 11/07/2013 0540   BUN 19.2 12/26/2013 0905   BUN 25* 11/16/2013 0847   CREATININE 0.8 12/26/2013 0905   CREATININE 1.20* 11/16/2013 0847      Component Value Date/Time   CALCIUM 9.6 12/26/2013 0905   CALCIUM 8.8 11/07/2013 0540   ALKPHOS 102 12/26/2013 0905   ALKPHOS 70 10/30/2013 1014   AST 11 12/26/2013 0905   AST 20 10/30/2013 1014   ALT 16 12/26/2013 0905   ALT 6 10/30/2013 1014   BILITOT <0.20 12/26/2013 0905   BILITOT <0.2* 10/30/2013 1014       No results found for this basename: LABCA2    No components found with this basename: LABCA125    No results found for this basename: INR,  in the last 168 hours  Urinalysis    Component Value Date/Time   COLORURINE ORANGE* 10/22/2013 0159   APPEARANCEUR CLOUDY* 10/22/2013 0159   LABSPEC 1.043* 10/22/2013 0159   PHURINE 5.5 10/22/2013 0159   GLUCOSEU >1000* 10/22/2013 0159   HGBUR MODERATE* 10/22/2013 0159   BILIRUBINUR MODERATE* 10/22/2013 0159    KETONESUR 15* 10/22/2013 0159   PROTEINUR 100* 10/22/2013 0159   UROBILINOGEN 2.0* 10/22/2013 0159   NITRITE NEGATIVE 10/22/2013 0159   LEUKOCYTESUR SMALL* 10/22/2013 0159    STUDIES: Dg Chest 2 View  10/22/2013   CLINICAL DATA:  Right breast pain and swelling.  Fever.  EXAM: CHEST  2 VIEW  COMPARISON:  06/13/2010  FINDINGS: The right breast is asymmetrically enlarged with extensive subcutaneous gas. This creates increased density over the right chest. There is no evidence of pneumonia in the lateral projection. No cardiomegaly. No edema, effusion, or pneumothorax.  Critical Value/emergent results were called by telephone at the time of interpretation on 10/22/2013 at 4:17 AM to Dr. Kathrynn Humble , who verbally acknowledged these results.  IMPRESSION: Right breast swelling with extensive subcutaneous gas. If no recent intervention, findings concerning for necrotizing infection.   Electronically Signed   By: Jorje Guild M.D.   On: 10/22/2013 04:17   Ct Chest W Contrast  10/26/2013   CLINICAL DATA:  58 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 Evaluate for metastatic disease.  EXAM: CT CHEST, ABDOMEN, AND PELVIS WITH CONTRAST  TECHNIQUE: Multidetector CT imaging of the chest, abdomen and pelvis was performed following the standard protocol during bolus administration of intravenous contrast.  CONTRAST:  145m OMNIPAQUE IOHEXOL 300 MG/ML  SOLN  COMPARISON:  CT of abdomen pelvis 06/21/2010.  FINDINGS: CT CHEST FINDINGS  Mediastinum: Heart size is normal. There is no significant pericardial fluid, thickening or pericardial calcification. No pathologically enlarged mediastinal, bilateral hilar or internal mammary lymph nodes. Esophagus is unremarkable in appearance.  Lungs/Pleura: Trace bilateral pleural  effusions layering dependently with some mild dependent subsegmental atelectasis in the lower lobes of the lungs bilaterally. No acute consolidative airspace disease. No suspicious appearing pulmonary  nodules or masses.  Musculoskeletal: Postoperative changes in the right breast are noted, with a large open wound filled with packing material. The periphery of the open wound demonstrates a thick rind of soft tissue, some of which appears to enhance. In the medial aspect of the right breast (image 27 of series 2) there is a 1.5 x 1.3 cm enhancing soft tissue lesion, suspicious for a focus of residual tumor or a malignant lymph node. Numerous borderline enlarged and mildly enlarged right axillary lymph nodes are noted, measuring up to 12 mm in short axis. There are no aggressive appearing lytic or blastic lesions noted in the visualized portions of the skeleton.  CT ABDOMEN AND PELVIS FINDINGS  Abdomen/Pelvis: Several well-defined low-attenuation lesions in the liver appear unchanged in size, number and pattern of distribution compared to prior study 2012, presumably small cysts. The largest of these lesions measures 2.1 cm in segment 4A. No other suspicious hepatic lesions are noted. Status post cholecystectomy. The appearance of the pancreas, spleen, bilateral adrenal glands and bilateral kidneys is unremarkable.  Normal appendix (retrocecal in position). No significant volume of ascites. No pneumoperitoneum. No pathologic distention of small bowel. There are a few colonic diverticulae, particularly in the region of the sigmoid colon, without surrounding inflammatory changes to suggest an acute diverticulitis at this time. Several small ventral hernias are noted, containing several loops of small bowel and a short segment of the mid transverse colon, without evidence of bowel incarceration or obstruction at this time. No lymphadenopathy identified within the abdomen or pelvis. Status post hysterectomy. Ovaries are not confidently identified may be surgically absent or atrophic.  Musculoskeletal: There are no aggressive appearing lytic or blastic lesions noted in the visualized portions of the skeleton. Edema  throughout the fat of the right flank, presumably reactive to the process in the right breast.  IMPRESSION: 1. Postoperative changes of recent right breast surgery with packed open wound which has a thick rind of soft tissue, some of which enhances, a 1.3 x 1.5 cm enhancing lesion in the medial aspect of the right breast which may represent residual tumor or a malignant lymph node, and multiple enlarged right axillary nodes concerning for axillary nodal metastases. 2. No other definite signs of metastatic disease in the chest, abdomen or pelvis. 3. Multiple small ventral hernias containing several loops of small bowel, as well as the mid transverse colon, without associated bowel incarceration or obstruction at this time. 4. Trace bilateral pleural effusions with mild dependent subsegmental atelectasis in the lower lobes of the lungs bilaterally. 5. Status post cholecystectomy and hysterectomy. 6. Additional incidental findings, as above.   Electronically Signed   By: Vinnie Langton M.D.   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 Evaluate for metastatic disease.  EXAM: CT CHEST, ABDOMEN, AND PELVIS WITH CONTRAST  TECHNIQUE: Multidetector CT imaging of the chest, abdomen and pelvis was performed following the standard protocol during bolus administration of intravenous contrast.  CONTRAST:  182m OMNIPAQUE IOHEXOL 300 MG/ML  SOLN  COMPARISON:  CT of abdomen pelvis 06/21/2010.  FINDINGS: CT CHEST FINDINGS  Mediastinum: Heart size is normal. There is no significant pericardial fluid, thickening or pericardial calcification. No pathologically enlarged mediastinal, bilateral hilar or internal mammary lymph nodes.  Esophagus is unremarkable in appearance.  Lungs/Pleura: Trace bilateral pleural effusions layering dependently with some mild dependent subsegmental atelectasis in the lower lobes of the lungs  bilaterally. No acute consolidative airspace disease. No suspicious appearing pulmonary nodules or masses.  Musculoskeletal: Postoperative changes in the right breast are noted, with a large open wound filled with packing material. The periphery of the open wound demonstrates a thick rind of soft tissue, some of which appears to enhance. In the medial aspect of the right breast (image 27 of series 2) there is a 1.5 x 1.3 cm enhancing soft tissue lesion, suspicious for a focus of residual tumor or a malignant lymph node. Numerous borderline enlarged and mildly enlarged right axillary lymph nodes are noted, measuring up to 12 mm in short axis. There are no aggressive appearing lytic or blastic lesions noted in the visualized portions of the skeleton.  CT ABDOMEN AND PELVIS FINDINGS  Abdomen/Pelvis: Several well-defined low-attenuation lesions in the liver appear unchanged in size, number and pattern of distribution compared to prior study 2012, presumably small cysts. The largest of these lesions measures 2.1 cm in segment 4A. No other suspicious hepatic lesions are noted. Status post cholecystectomy. The appearance of the pancreas, spleen, bilateral adrenal glands and bilateral kidneys is unremarkable.  Normal appendix (retrocecal in position). No significant volume of ascites. No pneumoperitoneum. No pathologic distention of small bowel. There are a few colonic diverticulae, particularly in the region of the sigmoid colon, without surrounding inflammatory changes to suggest an acute diverticulitis at this time. Several small ventral hernias are noted, containing several loops of small bowel and a short segment of the mid transverse colon, without evidence of bowel incarceration or obstruction at this time. No lymphadenopathy identified within the abdomen or pelvis. Status post hysterectomy. Ovaries are not confidently identified may be surgically absent or atrophic.  Musculoskeletal: There are no aggressive  appearing lytic or blastic lesions noted in the visualized portions of the skeleton. Edema throughout the fat of the right flank, presumably reactive to the process in the right breast.  IMPRESSION: 1. Postoperative changes of recent right breast surgery with packed open wound which has a thick rind of soft tissue, some of which enhances, a 1.3 x 1.5 cm enhancing lesion in the medial aspect of the right breast which may represent residual tumor or a malignant lymph node, and multiple enlarged right axillary nodes concerning for axillary nodal metastases. 2. No other definite signs of metastatic disease in the chest, abdomen or pelvis. 3. Multiple small ventral hernias containing several loops of small bowel, as well as the mid transverse colon, without associated bowel incarceration or obstruction at this time. 4. Trace bilateral pleural effusions with mild dependent subsegmental atelectasis in the lower lobes of the lungs bilaterally. 5. Status post cholecystectomy and hysterectomy. 6. Additional incidental findings, as above.   Electronically Signed   By: Vinnie Langton M.D.   On: 10/26/2013 08:35   Dg Chest Portable 1 View  11/16/2013   CLINICAL DATA:  Central catheter placement  EXAM: PORTABLE CHEST - 1 VIEW  COMPARISON:  Chest radiograph Oct 22, 2013 and chest CT Oct 25, 2013  FINDINGS: Port-A-Cath tip is in the superior vena cava. No pneumothorax. There is mild left base atelectasis. Elsewhere lungs are clear. Heart size and pulmonary vascularity are normal. No adenopathy. There is postoperative change on the right with clips in the right axilla.  IMPRESSION: Port-A-Cath tip in superior vena cava. No pneumothorax. Mild left base atelectasis. Lungs otherwise  clear.   Electronically Signed   By: Lowella Grip M.D.   On: 11/16/2013 13:58   Dg Fluoro Guide Cv Line-no Report  11/16/2013   CLINICAL DATA: PAC   FLOURO GUIDE CV LINE  Fluoroscopy was utilized by the requesting physician.  No radiographic   interpretation.     ASSESSMENT: 58 y.o. [place] woman  #1. stage IIIA  (pT3 pN1) invasive ductal carcinoma, grade 3, triple negative, with an MIB-1 of 95%.status post right modified radical mastectomy 10/26/2013  #2.  chemotherapy with dose dense Adriamycin and cyclophosphamide x 4 cycles, start date: 11/28/2013  #3. Followed by carboplatin and paclitaxel weekly x12, start date:   #4  postmastectomy radiation to follow after completion of chemotherapy.  PLAN:  Carmen Stone is doing well today.  Her lab work is stable.  She tolerated cycle 3 of Doxorubicin and Cyclophosphamide well.  She is not neutropenic today and is not at increased risk for infection.  She is anemic, however asymptomatic and we will continue to monitor.  I reviewed this with her in detail.    Due to her improvement in pain and range of motion she will not seek out referral to PT at this point.  I did recommend she inform us if she changes her mind and we can put in another referral.    Carmen Stone will return in 1 week for labs, evaluation and cycle 4 of Doxorubicin and Cyclophosphamide chemotherapy.  She is in agreement of the above plan.   She knows to call us in the interim for any questions or concerns.  We can certainly see her sooner if needed.  I spent 25 minutes counseling the patient face to face.  The total time spent in the appointment was 30 minutes.   Minette Headland, [place] [phone] 01/02/2014 3:50 PM

## 2014-01-09 ENCOUNTER — Encounter: Payer: Self-pay | Admitting: Adult Health

## 2014-01-09 ENCOUNTER — Other Ambulatory Visit (HOSPITAL_BASED_OUTPATIENT_CLINIC_OR_DEPARTMENT_OTHER): Payer: BC Managed Care – PPO

## 2014-01-09 ENCOUNTER — Ambulatory Visit (INDEPENDENT_AMBULATORY_CARE_PROVIDER_SITE_OTHER): Payer: BC Managed Care – PPO | Admitting: General Surgery

## 2014-01-09 ENCOUNTER — Encounter (INDEPENDENT_AMBULATORY_CARE_PROVIDER_SITE_OTHER): Payer: Self-pay | Admitting: General Surgery

## 2014-01-09 ENCOUNTER — Ambulatory Visit (HOSPITAL_BASED_OUTPATIENT_CLINIC_OR_DEPARTMENT_OTHER): Payer: BC Managed Care – PPO

## 2014-01-09 ENCOUNTER — Other Ambulatory Visit (INDEPENDENT_AMBULATORY_CARE_PROVIDER_SITE_OTHER): Payer: Self-pay | Admitting: General Surgery

## 2014-01-09 ENCOUNTER — Ambulatory Visit (HOSPITAL_BASED_OUTPATIENT_CLINIC_OR_DEPARTMENT_OTHER): Payer: BC Managed Care – PPO | Admitting: Adult Health

## 2014-01-09 VITALS — BP 134/78 | HR 102 | Temp 97.0°F | Ht 64.0 in | Wt 169.0 lb

## 2014-01-09 VITALS — BP 141/77 | HR 92 | Temp 99.2°F | Resp 18 | Ht 64.0 in | Wt 167.0 lb

## 2014-01-09 DIAGNOSIS — IMO0001 Reserved for inherently not codable concepts without codable children: Secondary | ICD-10-CM

## 2014-01-09 DIAGNOSIS — C50919 Malignant neoplasm of unspecified site of unspecified female breast: Secondary | ICD-10-CM

## 2014-01-09 DIAGNOSIS — Z5111 Encounter for antineoplastic chemotherapy: Secondary | ICD-10-CM

## 2014-01-09 DIAGNOSIS — Z171 Estrogen receptor negative status [ER-]: Secondary | ICD-10-CM

## 2014-01-09 DIAGNOSIS — G8918 Other acute postprocedural pain: Secondary | ICD-10-CM

## 2014-01-09 DIAGNOSIS — R0902 Hypoxemia: Secondary | ICD-10-CM

## 2014-01-09 DIAGNOSIS — N611 Abscess of the breast and nipple: Secondary | ICD-10-CM

## 2014-01-09 DIAGNOSIS — K219 Gastro-esophageal reflux disease without esophagitis: Principal | ICD-10-CM

## 2014-01-09 DIAGNOSIS — E119 Type 2 diabetes mellitus without complications: Secondary | ICD-10-CM

## 2014-01-09 DIAGNOSIS — C50911 Malignant neoplasm of unspecified site of right female breast: Secondary | ICD-10-CM

## 2014-01-09 LAB — CBC WITH DIFFERENTIAL/PLATELET
BASO%: 1.3 % (ref 0.0–2.0)
Basophils Absolute: 0.1 10*3/uL (ref 0.0–0.1)
EOS ABS: 0 10*3/uL (ref 0.0–0.5)
EOS%: 0.5 % (ref 0.0–7.0)
HCT: 30.6 % — ABNORMAL LOW (ref 34.8–46.6)
HEMOGLOBIN: 10.2 g/dL — AB (ref 11.6–15.9)
LYMPH%: 14.4 % (ref 14.0–49.7)
MCH: 30.1 pg (ref 25.1–34.0)
MCHC: 33.2 g/dL (ref 31.5–36.0)
MCV: 90.7 fL (ref 79.5–101.0)
MONO#: 0.8 10*3/uL (ref 0.1–0.9)
MONO%: 18.2 % — ABNORMAL HIGH (ref 0.0–14.0)
NEUT%: 65.6 % (ref 38.4–76.8)
NEUTROS ABS: 2.9 10*3/uL (ref 1.5–6.5)
PLATELETS: 312 10*3/uL (ref 145–400)
RBC: 3.37 10*6/uL — ABNORMAL LOW (ref 3.70–5.45)
RDW: 17.4 % — ABNORMAL HIGH (ref 11.2–14.5)
WBC: 4.4 10*3/uL (ref 3.9–10.3)
lymph#: 0.6 10*3/uL — ABNORMAL LOW (ref 0.9–3.3)

## 2014-01-09 LAB — COMPREHENSIVE METABOLIC PANEL (CC13)
ALT: 14 U/L (ref 0–55)
ANION GAP: 9 meq/L (ref 3–11)
AST: 12 U/L (ref 5–34)
Albumin: 3.5 g/dL (ref 3.5–5.0)
Alkaline Phosphatase: 85 U/L (ref 40–150)
BUN: 16.5 mg/dL (ref 7.0–26.0)
CALCIUM: 9.3 mg/dL (ref 8.4–10.4)
CO2: 26 meq/L (ref 22–29)
CREATININE: 0.7 mg/dL (ref 0.6–1.1)
Chloride: 102 mEq/L (ref 98–109)
GLUCOSE: 165 mg/dL — AB (ref 70–140)
Potassium: 4.2 mEq/L (ref 3.5–5.1)
Sodium: 138 mEq/L (ref 136–145)
TOTAL PROTEIN: 6.4 g/dL (ref 6.4–8.3)
Total Bilirubin: 0.2 mg/dL (ref 0.20–1.20)

## 2014-01-09 MED ORDER — SODIUM CHLORIDE 0.9 % IV SOLN
Freq: Once | INTRAVENOUS | Status: AC
  Administered 2014-01-09: 10:00:00 via INTRAVENOUS

## 2014-01-09 MED ORDER — HEPARIN SOD (PORK) LOCK FLUSH 100 UNIT/ML IV SOLN
500.0000 [IU] | Freq: Once | INTRAVENOUS | Status: AC | PRN
  Administered 2014-01-09: 500 [IU]
  Filled 2014-01-09: qty 5

## 2014-01-09 MED ORDER — PALONOSETRON HCL INJECTION 0.25 MG/5ML
0.2500 mg | Freq: Once | INTRAVENOUS | Status: AC
  Administered 2014-01-09: 0.25 mg via INTRAVENOUS

## 2014-01-09 MED ORDER — PANTOPRAZOLE SODIUM 40 MG PO TBEC: 40.0000 mg | DELAYED_RELEASE_TABLET | Freq: Every day | ORAL | Status: DC

## 2014-01-09 MED ORDER — SODIUM CHLORIDE 0.9 % IV SOLN
150.0000 mg | Freq: Once | INTRAVENOUS | Status: AC
  Administered 2014-01-09: 150 mg via INTRAVENOUS
  Filled 2014-01-09: qty 5

## 2014-01-09 MED ORDER — DEXAMETHASONE SODIUM PHOSPHATE 20 MG/5ML IJ SOLN
INTRAMUSCULAR | Status: AC
  Filled 2014-01-09: qty 5

## 2014-01-09 MED ORDER — SODIUM CHLORIDE 0.9 % IJ SOLN
10.0000 mL | INTRAMUSCULAR | Status: DC | PRN
  Administered 2014-01-09: 10 mL
  Filled 2014-01-09: qty 10

## 2014-01-09 MED ORDER — SODIUM CHLORIDE 0.9 % IV SOLN
600.0000 mg/m2 | Freq: Once | INTRAVENOUS | Status: AC
  Administered 2014-01-09: 1120 mg via INTRAVENOUS
  Filled 2014-01-09: qty 56

## 2014-01-09 MED ORDER — ONDANSETRON HCL 8 MG PO TABS: 8.0000 mg | ORAL_TABLET | Freq: Three times a day (TID) | ORAL | Status: DC | PRN

## 2014-01-09 MED ORDER — PALONOSETRON HCL INJECTION 0.25 MG/5ML
INTRAVENOUS | Status: AC
  Filled 2014-01-09: qty 5

## 2014-01-09 MED ORDER — DOXORUBICIN HCL CHEMO IV INJECTION 2 MG/ML
60.0000 mg/m2 | Freq: Once | INTRAVENOUS | Status: AC
  Administered 2014-01-09: 112 mg via INTRAVENOUS
  Filled 2014-01-09: qty 56

## 2014-01-09 MED ORDER — LORAZEPAM 0.5 MG PO TABS: 0.5000 mg | ORAL_TABLET | Freq: Every evening | ORAL | Status: AC | PRN

## 2014-01-09 MED ORDER — TRAMADOL HCL 50 MG PO TABS: 50.0000 mg | ORAL_TABLET | Freq: Four times a day (QID) | ORAL | Status: DC | PRN

## 2014-01-09 MED ORDER — DEXAMETHASONE SODIUM PHOSPHATE 20 MG/5ML IJ SOLN
12.0000 mg | Freq: Once | INTRAMUSCULAR | Status: AC
  Administered 2014-01-09: 12 mg via INTRAVENOUS

## 2014-01-09 NOTE — Patient Instructions (Signed)
[facility] Discharge Instructions for Patients Receiving Chemotherapy  Today you received the following chemotherapy agents doxorubicin/cytoxan.   To help prevent nausea and vomiting after your treatment, we encourage you to take your nausea medication as directed   If you develop nausea and vomiting that is not controlled by your nausea medication, call the clinic.   BELOW ARE SYMPTOMS THAT SHOULD BE REPORTED IMMEDIATELY:  *FEVER GREATER THAN 100.5 F  *CHILLS WITH OR WITHOUT FEVER  NAUSEA AND VOMITING THAT IS NOT CONTROLLED WITH YOUR NAUSEA MEDICATION  *UNUSUAL SHORTNESS OF BREATH  *UNUSUAL BRUISING OR BLEEDING  TENDERNESS IN MOUTH AND THROAT WITH OR WITHOUT PRESENCE OF ULCERS  *URINARY PROBLEMS  *BOWEL PROBLEMS  UNUSUAL RASH Items with * indicate a potential emergency and should be followed up as soon as possible.  Feel free to call the clinic you have any questions or concerns. The clinic phone number is [phone].

## 2014-01-09 NOTE — Progress Notes (Signed)
[place]  [phone] [phone]     ID: Carmen Stone OB: 1956/04/17  MR#: [id number]  EXB#:[id number]  PCP: [name], MD GYN:   SU: [name] MD OTHER MD:   CHIEF COMPLAINT: 58 y/o woman with inflammatory breast cancer undergoing adjuvant chemotherapy  BREAST CANCER HISTORY: As per previously dictated Dr.Magrinat's note:  Ms. Broadfoot presented to the emergency room on 10/22/2013 complaining of right breast pain and discharge. On exam the breast was diffusely erythematous with" orange peel" appearance. She was admitted for debridement and surgery he describes a skin defect in the lower inner quadrant measuring approximately 4 cm. When the breast was breast, air came out through the hole--there are photos included with the admission note. A chest x-ray was obtained which showed the right breast to be asymmetrically enlarged with extensive subcutaneous gas.  On the same day the patient underwent debridement of the wound, with the pathology (SZA [id number]) showing high-grade carcinoma involving ulcerated skin. There was tumor within the dermal and subcutaneous lymphatics. The tumor was negative for estrogen, progesterone, and HER-2(signals ratio 1.24, number per cell 3.6), with an MIB-1 of 95%. Gross cystic disease fluid protein was also negative.  The patient was admitted and started on antibiotics. On 10/24/2013 an attempt was made at further debridement, but the inside of the breast essentially came out in hand falls. The pathology from this procedure(SZA 15-20 and 26) again showed high-grade carcinoma.  On 10/25/2013 the patient underwent CT scans of the chest abdomen and pelvis. Aside from disease in the breast and right axilla there was no evidence of metastatic disease.  Accordingly Dr. [name] proceeded to right mastectomy with axillary lymph node dissection 10/26/2013. Results of this procedure ([id number]) showed a 15  cm defect in the middle of the breast, which is 4 prior necrotic tissue had been scoped out. There was nevertheless extensive invasive ductal carcinoma, grade 3, in the specimen and repeat or blastic panel was again triple negative. 2 of 16 axillary lymph nodes were involved by macrometastatic deposits. The patient was continued on antibiotics and eventually discharged 11/03/2013.  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  This is given on day 1 of a 14 day cycle, with Neulasta given on day 2 for granulocyte support.  She is currently cycle 4 day 1 of therapy.  Carmen Stone is doing moderately well today.  She is here for evaluation prior to receiving her fourth cycle of Doxorubicin and cyclophosphamide.  She is doing moderately well.  She is requesting several refills on Lorazepam, Ondansetron, Percocet, Pantoprazole, and Tramadol.  She had reported last week that she hadn't had to use any of the Percocet.  She tells me that she has had severe pinching at her mastectomy site and has taken the Percocet for this.  She also tells me that she is out of Tramadol, but cannot remember what she has been taking this medication for.  She denies fevers, chills, nausea, vomiting, constipation, diarrhea, numbness/tingling, skin changes, new pain, or any further concerns.    REVIEW OF SYSTEMS: A 10 point review of systems is been assessed and pertinent symptoms as mentioned in interval history   PAST MEDICAL HISTORY: Past Medical History  Diagnosis Date  . Diabetes mellitus, type II   . Inflammatory  breast cancer     PAST SURGICAL HISTORY: Past Surgical History  Procedure Laterality Date  . Cesarean section    . Ventral hernia repair  2011    repair incacerated VH with biologic  mesh; cholecystectomy  . Cholecystectomy  2011  . Incision and drainage of wound Right 10/22/2013    Procedure: IRRIGATION AND DRAINAGE AND DEBRIDEMENT RIGHT BREAST WOUND;  Surgeon: [name], MD;  Location: [facility];  Service: General;  Laterality: Right;  . Breast biopsy Right 10/22/2013    Procedure: BREAST BIOPSY;  Surgeon: [name], MD;  Location: [facility];  Service: General;  Laterality: Right;  . Irrigation and debridement abscess Right 10/24/2013    Procedure: Right total mastectomy;  Surgeon: [name], MD;  Location: [facility];  Service: General;  Laterality: Right;  . Dressing change under anesthesia Right 10/26/2013    Procedure: COMPLETION MASTECTOMY, AXILLARY DISSECTION;  Surgeon: [name], MD;  Location: [facility];  Service: General;  Laterality: Right;  . Portacath placement N/A 11/16/2013    Procedure: INSERTION PORT-A-CATH;  Surgeon: [name], MD;  Location: [facility];  Service: General;  Laterality: N/A;    FAMILY HISTORY No family history on file. The patient's father was a heavy smoker and died from complications of emphysema at the age of 61. The patient's mother died from lung cancer at the age of 43. She also was a heavy smoker. The patient had one brother, no sisters. There is no history of breast or ovarian cancer in the family.  GYNECOLOGIC HISTORY:  Menarche age 89, first live birth age 77, the patient is GX P1. She went through the change of life approximately age 18. She did not take hormone replacement.  SOCIAL HISTORY:  Carmen Stone is divorced. She works for [employer] and also works a second job at [employer] on [employer]. She plans to be on disability during her chemoradiation treatments. She lives alone, but her daughter [name] frequently stays there overnight. [name] was [employer]. 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 She spends her time between helping her mom and helping her grandfather, Violet Seabury, who  lives in [place] and has significant dementia. Mr. Milley was present at the 11/18/2013 visit only because [name] wanted to be present and otherwise no one would have been with Mr. Cammarano. Carmen Stone has no grandchildren. She attends a [employer] church    ADVANCED DIRECTIVES: In place. The patient has named her brother Clide Cliff as her healthcare power of attorney. Iona Beard can be reached at [phone]. He is a Pharmacist, hospital locally  HEALTH MAINTENANCE: History  Substance Use Topics  . Smoking status: Never Smoker   . Smokeless tobacco: Not on file  . Alcohol Use: No     Colonoscopy:  PAP:  Bone density:  Lipid panel:  No Known Allergies  Current Outpatient Prescriptions  Medication Sig Dispense Refill  . dexamethasone (DECADRON) 4 MG tablet Take 2 tablets by mouth once a day on the day after chemotherapy and then take 2 tablets two times a day for 2 days. Take with food.  30 tablet  1  . glucose blood test strip Use as instructed  100 each  12  . glucose monitoring kit (FREESTYLE) monitoring kit 1 each by Does not apply route as needed for other. Check blood sugars daily.  1 each  0  . Insulin Glargine (LANTUS SOLOSTAR) 100 UNIT/ML Solostar Pen Inject 10 Units into the skin daily at 10 pm.  15 mL  3  . lidocaine-prilocaine (EMLA) cream Apply 1 application topically as needed. Apply over port area 1-2 hours before treatment, cover with plastic wrap  30 g  0  . linagliptin (TRADJENTA) 5 MG TABS tablet Take 1 tablet (5 mg total) by mouth daily.  30 tablet  0  . ondansetron (ZOFRAN) 8 MG tablet Take 1 tablet (8 mg total) by mouth every 8 (eight) hours as needed for nausea or vomiting.  20 tablet  3  . pantoprazole (PROTONIX) 40 MG tablet Take 1 tablet (40 mg total) by mouth daily at 6 (six) AM.  30 tablet  3  . acetaminophen (TYLENOL) 325 MG tablet Take 650 mg by mouth every 6 (six) hours as needed for mild pain.      Marland Kitchen LORazepam (ATIVAN) 0.5 MG tablet Take 1 tablet (0.5 mg total) by  mouth at bedtime as needed (Nausea or vomiting).  30 tablet  0  . oxyCODONE-acetaminophen (PERCOCET) 10-325 MG per tablet Take 1 tablet by mouth every 6 (six) hours as needed for pain.  30 tablet  0  . prochlorperazine (COMPAZINE) 10 MG tablet Take 1 tablet (10 mg total) by mouth every 6 (six) hours as needed (Nausea or vomiting).  30 tablet  1  . traMADol (ULTRAM) 50 MG tablet Take 1 tablet (50 mg total) by mouth every 6 (six) hours as needed.  20 tablet  0   No current facility-administered medications for this visit.    OBJECTIVE: Middle-aged [place] American woman in no acute distress Filed Vitals:   01/09/14 0838  BP: 141/77  Pulse: 92  Temp: 99.2 F (37.3 C)  Resp: 18     Body mass index is 28.65 kg/(m^2).    ECOG FS:1 - Symptomatic but completely ambulatory  GENERAL: Patient is a well appearing female in no acute distress HEENT:  Sclerae anicteric.  Oropharynx clear and moist. No ulcerations or evidence of oropharyngeal candidiasis. Neck is supple.  NODES:  No cervical, supraclavicular, or axillary lymphadenopathy palpated.  BREAST EXAM:   S/p right mastectomy, appears to be a stitch in place at the medial scar, there is also some nodularity directly above this.   LUNGS:  Clear to auscultation bilaterally.  No wheezes or rhonchi. HEART:  Regular rate and rhythm. No murmur appreciated. ABDOMEN:  Soft, nontender.  Positive, normoactive bowel sounds. No organomegaly palpated. MSK:  No focal spinal tenderness to palpation. Full range of motion bilaterally in the upper extremities. EXTREMITIES:  No peripheral edema.  Right axilla and right arm specifically examined, without any swelling.   SKIN:  Clear with no obvious rashes or skin changes. No nail dyscrasia.  Port in left chest wall without erythema or tenderness.   NEURO:  Nonfocal. Well oriented.  Appropriate affect.    LAB RESULTS:  CMP     Component Value Date/Time   NA 138 01/09/2014 0817   NA 142 11/16/2013 0847   K 4.2  01/09/2014 0817   K 4.1 11/16/2013 0847   CL 106 11/16/2013 0847   CO2 26 01/09/2014 0817   CO2 27 11/07/2013 0540   GLUCOSE 165* 01/09/2014 0817   GLUCOSE 149* 11/16/2013 0847   BUN 16.5 01/09/2014 0817   BUN 25* 11/16/2013 0847   CREATININE 0.7 01/09/2014 0817   CREATININE 1.20* 11/16/2013 0847   CALCIUM 9.3 01/09/2014 0817   CALCIUM 8.8 11/07/2013 0540   PROT 6.4 01/09/2014 0817   PROT 6.5 10/30/2013 1014   ALBUMIN 3.5 01/09/2014 4008  ALBUMIN 2.2* 10/30/2013 1014   AST 12 01/09/2014 0817   AST 20 10/30/2013 1014   ALT 14 01/09/2014 0817   ALT 6 10/30/2013 1014   ALKPHOS 85 01/09/2014 0817   ALKPHOS 70 10/30/2013 1014   BILITOT 0.20 01/09/2014 0817   BILITOT <0.2* 10/30/2013 1014   GFRNONAA 37* 11/07/2013 0540   GFRAA 43* 11/07/2013 0540    I No results found for this basename: SPEP,  UPEP,   kappa and lambda light chains    Lab Results  Component Value Date   WBC 4.4 01/09/2014   NEUTROABS 2.9 01/09/2014   HGB 10.2* 01/09/2014   HCT 30.6* 01/09/2014   MCV 90.7 01/09/2014   PLT 312 01/09/2014      Chemistry      Component Value Date/Time   NA 138 01/09/2014 0817   NA 142 11/16/2013 0847   K 4.2 01/09/2014 0817   K 4.1 11/16/2013 0847   CL 106 11/16/2013 0847   CO2 26 01/09/2014 0817   CO2 27 11/07/2013 0540   BUN 16.5 01/09/2014 0817   BUN 25* 11/16/2013 0847   CREATININE 0.7 01/09/2014 0817   CREATININE 1.20* 11/16/2013 0847      Component Value Date/Time   CALCIUM 9.3 01/09/2014 0817   CALCIUM 8.8 11/07/2013 0540   ALKPHOS 85 01/09/2014 0817   ALKPHOS 70 10/30/2013 1014   AST 12 01/09/2014 0817   AST 20 10/30/2013 1014   ALT 14 01/09/2014 0817   ALT 6 10/30/2013 1014   BILITOT 0.20 01/09/2014 0817   BILITOT <0.2* 10/30/2013 1014       No results found for this basename: LABCA2    No components found with this basename: LABCA125    No results found for this basename: INR,  in the last 168 hours  Urinalysis    Component Value Date/Time   COLORURINE ORANGE* 10/22/2013 0159   APPEARANCEUR  CLOUDY* 10/22/2013 0159   LABSPEC 1.043* 10/22/2013 0159   PHURINE 5.5 10/22/2013 0159   GLUCOSEU >1000* 10/22/2013 0159   HGBUR MODERATE* 10/22/2013 0159   BILIRUBINUR MODERATE* 10/22/2013 0159   KETONESUR 15* 10/22/2013 0159   PROTEINUR 100* 10/22/2013 0159   UROBILINOGEN 2.0* 10/22/2013 0159   NITRITE NEGATIVE 10/22/2013 0159   LEUKOCYTESUR SMALL* 10/22/2013 0159    STUDIES: Dg Chest 2 View  10/22/2013   CLINICAL DATA:  Right breast pain and swelling.  Fever.  EXAM: CHEST  2 VIEW  COMPARISON:  06/13/2010  FINDINGS: The right breast is asymmetrically enlarged with extensive subcutaneous gas. This creates increased density over the right chest. There is no evidence of pneumonia in the lateral projection. No cardiomegaly. No edema, effusion, or pneumothorax.  Critical Value/emergent results were called by telephone at the time of interpretation on 10/22/2013 at 4:17 AM to Dr. Kathrynn Humble , who verbally acknowledged these results.  IMPRESSION: Right breast swelling with extensive subcutaneous gas. If no recent intervention, findings concerning for necrotizing infection.   Electronically Signed   By: Jorje Guild M.D.   On: 10/22/2013 04:17   Ct Chest W Contrast  10/26/2013   CLINICAL DATA:  58 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 Evaluate for metastatic disease.  EXAM: CT CHEST, ABDOMEN, AND PELVIS WITH CONTRAST  TECHNIQUE: Multidetector CT imaging of the chest, abdomen and pelvis was performed following the standard protocol during bolus administration of intravenous contrast.  CONTRAST:  134m OMNIPAQUE IOHEXOL 300 MG/ML  SOLN  COMPARISON:  CT of abdomen pelvis 06/21/2010.  FINDINGS: CT CHEST  FINDINGS  Mediastinum: Heart size is normal. There is no significant pericardial fluid, thickening or pericardial calcification. No pathologically enlarged mediastinal, bilateral hilar or internal mammary lymph nodes. Esophagus is unremarkable in appearance.  Lungs/Pleura: Trace bilateral pleural effusions  layering dependently with some mild dependent subsegmental atelectasis in the lower lobes of the lungs bilaterally. No acute consolidative airspace disease. No suspicious appearing pulmonary nodules or masses.  Musculoskeletal: Postoperative changes in the right breast are noted, with a large open wound filled with packing material. The periphery of the open wound demonstrates a thick rind of soft tissue, some of which appears to enhance. In the medial aspect of the right breast (image 27 of series 2) there is a 1.5 x 1.3 cm enhancing soft tissue lesion, suspicious for a focus of residual tumor or a malignant lymph node. Numerous borderline enlarged and mildly enlarged right axillary lymph nodes are noted, measuring up to 12 mm in short axis. There are no aggressive appearing lytic or blastic lesions noted in the visualized portions of the skeleton.  CT ABDOMEN AND PELVIS FINDINGS  Abdomen/Pelvis: Several well-defined low-attenuation lesions in the liver appear unchanged in size, number and pattern of distribution compared to prior study 2012, presumably small cysts. The largest of these lesions measures 2.1 cm in segment 4A. No other suspicious hepatic lesions are noted. Status post cholecystectomy. The appearance of the pancreas, spleen, bilateral adrenal glands and bilateral kidneys is unremarkable.  Normal appendix (retrocecal in position). No significant volume of ascites. No pneumoperitoneum. No pathologic distention of small bowel. There are a few colonic diverticulae, particularly in the region of the sigmoid colon, without surrounding inflammatory changes to suggest an acute diverticulitis at this time. Several small ventral hernias are noted, containing several loops of small bowel and a short segment of the mid transverse colon, without evidence of bowel incarceration or obstruction at this time. No lymphadenopathy identified within the abdomen or pelvis. Status post hysterectomy. Ovaries are not  confidently identified may be surgically absent or atrophic.  Musculoskeletal: There are no aggressive appearing lytic or blastic lesions noted in the visualized portions of the skeleton. Edema throughout the fat of the right flank, presumably reactive to the process in the right breast.  IMPRESSION: 1. Postoperative changes of recent right breast surgery with packed open wound which has a thick rind of soft tissue, some of which enhances, a 1.3 x 1.5 cm enhancing lesion in the medial aspect of the right breast which may represent residual tumor or a malignant lymph node, and multiple enlarged right axillary nodes concerning for axillary nodal metastases. 2. No other definite signs of metastatic disease in the chest, abdomen or pelvis. 3. Multiple small ventral hernias containing several loops of small bowel, as well as the mid transverse colon, without associated bowel incarceration or obstruction at this time. 4. Trace bilateral pleural effusions with mild dependent subsegmental atelectasis in the lower lobes of the lungs bilaterally. 5. Status post cholecystectomy and hysterectomy. 6. Additional incidental findings, as above.   Electronically Signed   By: Vinnie Langton M.D.   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 Evaluate for metastatic disease.  EXAM: CT CHEST, ABDOMEN, AND PELVIS WITH CONTRAST  TECHNIQUE: Multidetector CT imaging of the chest, abdomen and pelvis was performed following the standard protocol during bolus administration of intravenous contrast.  CONTRAST:  174m OMNIPAQUE IOHEXOL 300 MG/ML  SOLN  COMPARISON:  CT of abdomen pelvis 06/21/2010.  FINDINGS: CT CHEST FINDINGS  Mediastinum: Heart size is normal. There is no significant pericardial fluid, thickening or pericardial calcification. No pathologically enlarged mediastinal, bilateral hilar or internal mammary lymph nodes. Esophagus  is unremarkable in appearance.  Lungs/Pleura: Trace bilateral pleural effusions layering dependently with some mild dependent subsegmental atelectasis in the lower lobes of the lungs bilaterally. No acute consolidative airspace disease. No suspicious appearing pulmonary nodules or masses.  Musculoskeletal: Postoperative changes in the right breast are noted, with a large open wound filled with packing material. The periphery of the open wound demonstrates a thick rind of soft tissue, some of which appears to enhance. In the medial aspect of the right breast (image 27 of series 2) there is a 1.5 x 1.3 cm enhancing soft tissue lesion, suspicious for a focus of residual tumor or a malignant lymph node. Numerous borderline enlarged and mildly enlarged right axillary lymph nodes are noted, measuring up to 12 mm in short axis. There are no aggressive appearing lytic or blastic lesions noted in the visualized portions of the skeleton.  CT ABDOMEN AND PELVIS FINDINGS  Abdomen/Pelvis: Several well-defined low-attenuation lesions in the liver appear unchanged in size, number and pattern of distribution compared to prior study 2012, presumably small cysts. The largest of these lesions measures 2.1 cm in segment 4A. No other suspicious hepatic lesions are noted. Status post cholecystectomy. The appearance of the pancreas, spleen, bilateral adrenal glands and bilateral kidneys is unremarkable.  Normal appendix (retrocecal in position). No significant volume of ascites. No pneumoperitoneum. No pathologic distention of small bowel. There are a few colonic diverticulae, particularly in the region of the sigmoid colon, without surrounding inflammatory changes to suggest an acute diverticulitis at this time. Several small ventral hernias are noted, containing several loops of small bowel and a short segment of the mid transverse colon, without evidence of bowel incarceration or obstruction at this time. No lymphadenopathy identified  within the abdomen or pelvis. Status post hysterectomy. Ovaries are not confidently identified may be surgically absent or atrophic.  Musculoskeletal: There are no aggressive appearing lytic or blastic lesions noted in the visualized portions of the skeleton. Edema throughout the fat of the right flank, presumably reactive to the process in the right breast.  IMPRESSION: 1. Postoperative changes of recent right breast surgery with packed open wound which has a thick rind of soft tissue, some of which enhances, a 1.3 x 1.5 cm enhancing lesion in the medial aspect of the right breast which may represent residual tumor or a malignant lymph node, and multiple enlarged right axillary nodes concerning for axillary nodal metastases. 2. No other definite signs of metastatic disease in the chest, abdomen or pelvis. 3. Multiple small ventral hernias containing several loops of small bowel, as well as the mid transverse colon, without associated bowel incarceration or obstruction at this time. 4. Trace bilateral pleural effusions with mild dependent subsegmental atelectasis in the lower lobes of the lungs bilaterally. 5. Status post cholecystectomy and hysterectomy. 6. Additional incidental findings, as above.   Electronically Signed   By: Vinnie Langton M.D.   On: 10/26/2013 08:35   Dg Chest Portable 1 View  11/16/2013   CLINICAL DATA:  Central catheter placement  EXAM: PORTABLE CHEST - 1 VIEW  COMPARISON:  Chest radiograph Oct 22, 2013 and chest CT Oct 25, 2013  FINDINGS: Port-A-Cath tip is in the superior vena cava. No pneumothorax. There is mild left base atelectasis. Elsewhere lungs are clear. Heart  size and pulmonary vascularity are normal. No adenopathy. There is postoperative change on the right with clips in the right axilla.  IMPRESSION: Port-A-Cath tip in superior vena cava. No pneumothorax. Mild left base atelectasis. Lungs otherwise clear.   Electronically Signed   By: Lowella Grip M.D.   On: 11/16/2013  13:58   Dg Fluoro Guide Cv Line-no Report  11/16/2013   CLINICAL DATA: PAC   FLOURO GUIDE CV LINE  Fluoroscopy was utilized by the requesting physician.  No radiographic  interpretation.     ASSESSMENT: 58 y.o. [place] woman  #1. stage IIIA  (pT3 pN1) invasive ductal carcinoma, grade 3, triple negative, with an MIB-1 of 95%.status post right modified radical mastectomy 10/26/2013  #2.  chemotherapy with dose dense Adriamycin and cyclophosphamide x 4 cycles, start date: 11/28/2013  #3. Followed by carboplatin and paclitaxel weekly x12, start date:   #4  postmastectomy radiation to follow after completion of chemotherapy.  PLAN:  Kadian is doing well today.  Her lab work is stable and I reviewed this with her in detail.  She will proceed with cycle 4 of Doxorubicin and Cyclophosphamide.    I reviewed her medications with her in detail.  I expressed concern that she has gone through 30 percocet in a one week period, along with not knowing what she is taking the Tramadol for.  I reviewed each of her medications with her in detail, along with why she should be taking these.  Since she is using the percocet for post-surgical pain, I will defer to Dr. [name] to prescribe this along with the Tramadol as he is the last prescriber of this medication.  I will refill her Lorazepam #30, Pantoprazole, and Ondansetron.  I have requested that she keep a journal of what she is taking, when she is taking it, and for what reason, and expressed the importance that she note why she is taking the pain medication so we can evaluate any new pain, or concern.  The patient is in agreement with this.    I did call and talk with Dr. [name] about the above concerning her medications, the stitch, and the nodularity.  She already has an appointment with him today at [place] will return tomorrow for Neulasta and in one week for labs and evaluation by Dr. Jana Hakim.  She is in agreement of the above plan.    She knows to call us in the interim for any questions or concerns.  We can certainly see her sooner if needed.  I spent 25 minutes counseling the patient face to face.  The total time spent in the appointment was 30 minutes.   Minette Headland, [place] [phone] 01/09/2014 9:16 AM

## 2014-01-09 NOTE — Progress Notes (Signed)
Subjective:     Patient ID: Carmen Stone, female   DOB: Sep 10, 1955, 58 y.o.   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 This was necrotic breast that began concerning for necrotizing soft tissue infection.  She has been undergoing chemo and doing well.  She has one stitch left and is still having some pain at the surgical site.  Review of Systems     Objective:   Physical Exam Right breast incision healing well without infection, there is nodule at medial aspect, stitch remains medially also    Assessment:     Right breast inflammatory cancer     Plan:     She is going to complete chemotherapy then proceed with radiation therapy.  I removed the stitch.  I will refer her to see physical therapy also.  I did give her some tramadol and told her that she will then start taking some alleve.

## 2014-01-09 NOTE — Patient Instructions (Signed)
Please keep a written log of your medications, and when and why you are taking them.  I need to know how and why you are taking your pain medications.

## 2014-01-10 ENCOUNTER — Ambulatory Visit (HOSPITAL_BASED_OUTPATIENT_CLINIC_OR_DEPARTMENT_OTHER): Payer: BC Managed Care – PPO

## 2014-01-10 VITALS — BP 143/66 | HR 90 | Temp 98.1°F

## 2014-01-10 DIAGNOSIS — C50919 Malignant neoplasm of unspecified site of unspecified female breast: Secondary | ICD-10-CM

## 2014-01-10 DIAGNOSIS — Z5189 Encounter for other specified aftercare: Secondary | ICD-10-CM

## 2014-01-10 MED ORDER — PEGFILGRASTIM INJECTION 6 MG/0.6ML
6.0000 mg | Freq: Once | SUBCUTANEOUS | Status: AC
  Administered 2014-01-10: 6 mg via SUBCUTANEOUS
  Filled 2014-01-10: qty 0.6

## 2014-01-13 ENCOUNTER — Telehealth (INDEPENDENT_AMBULATORY_CARE_PROVIDER_SITE_OTHER): Payer: Self-pay

## 2014-01-13 NOTE — Telephone Encounter (Signed)
Called pt back. I advised pt that Jobe Igo w/PT has tried 2 x's to reach pt but not able to leave messages on pt's phone. The pt said she had problems with her phone so she got a new phone today and now able to receive messages. I will notify Jobe Igo w/PT so they can try to reach pt again. Pt understands.

## 2014-01-13 NOTE — Telephone Encounter (Signed)
Message copied by Illene Regulus on Fri Jan 13, 2014  2:35 PM ------      Message from: [place], [place]: Fri Jan 13, 2014 12:07 PM      Regarding: Re: Physical Therapy Appt      Contact: [phone]       Pt said physical therapist never called to setup an apt. Pt requesting a call from you.....df ------

## 2014-01-16 ENCOUNTER — Other Ambulatory Visit: Payer: Self-pay | Admitting: *Deleted

## 2014-01-16 ENCOUNTER — Telehealth: Payer: Self-pay | Admitting: *Deleted

## 2014-01-16 ENCOUNTER — Other Ambulatory Visit (HOSPITAL_BASED_OUTPATIENT_CLINIC_OR_DEPARTMENT_OTHER): Payer: BC Managed Care – PPO

## 2014-01-16 ENCOUNTER — Telehealth: Payer: Self-pay | Admitting: Oncology

## 2014-01-16 ENCOUNTER — Ambulatory Visit (HOSPITAL_BASED_OUTPATIENT_CLINIC_OR_DEPARTMENT_OTHER): Payer: BC Managed Care – PPO | Admitting: Oncology

## 2014-01-16 VITALS — BP 130/64 | HR 106 | Temp 98.9°F | Resp 18 | Ht 64.0 in | Wt 165.7 lb

## 2014-01-16 DIAGNOSIS — N170 Acute kidney failure with tubular necrosis: Secondary | ICD-10-CM

## 2014-01-16 DIAGNOSIS — C50919 Malignant neoplasm of unspecified site of unspecified female breast: Secondary | ICD-10-CM

## 2014-01-16 DIAGNOSIS — R0902 Hypoxemia: Secondary | ICD-10-CM

## 2014-01-16 DIAGNOSIS — C773 Secondary and unspecified malignant neoplasm of axilla and upper limb lymph nodes: Secondary | ICD-10-CM

## 2014-01-16 DIAGNOSIS — N611 Abscess of the breast and nipple: Secondary | ICD-10-CM

## 2014-01-16 DIAGNOSIS — G8918 Other acute postprocedural pain: Secondary | ICD-10-CM

## 2014-01-16 DIAGNOSIS — E119 Type 2 diabetes mellitus without complications: Secondary | ICD-10-CM

## 2014-01-16 LAB — COMPREHENSIVE METABOLIC PANEL (CC13)
ALT: 11 U/L (ref 0–55)
AST: 10 U/L (ref 5–34)
Albumin: 3.3 g/dL — ABNORMAL LOW (ref 3.5–5.0)
Alkaline Phosphatase: 79 U/L (ref 40–150)
Anion Gap: 8 mEq/L (ref 3–11)
BILIRUBIN TOTAL: 0.23 mg/dL (ref 0.20–1.20)
BUN: 12.1 mg/dL (ref 7.0–26.0)
CO2: 26 mEq/L (ref 22–29)
CREATININE: 0.7 mg/dL (ref 0.6–1.1)
Calcium: 8.9 mg/dL (ref 8.4–10.4)
Chloride: 105 mEq/L (ref 98–109)
Glucose: 143 mg/dl — ABNORMAL HIGH (ref 70–140)
Potassium: 3.8 mEq/L (ref 3.5–5.1)
SODIUM: 138 meq/L (ref 136–145)
TOTAL PROTEIN: 6.1 g/dL — AB (ref 6.4–8.3)

## 2014-01-16 LAB — CBC WITH DIFFERENTIAL/PLATELET
BASO%: 5 % — AB (ref 0.0–2.0)
Basophils Absolute: 0.1 10*3/uL (ref 0.0–0.1)
EOS%: 5 % (ref 0.0–7.0)
Eosinophils Absolute: 0.1 10*3/uL (ref 0.0–0.5)
HCT: 25.5 % — ABNORMAL LOW (ref 34.8–46.6)
HGB: 8.5 g/dL — ABNORMAL LOW (ref 11.6–15.9)
LYMPH%: 62 % — ABNORMAL HIGH (ref 14.0–49.7)
MCH: 29.9 pg (ref 25.1–34.0)
MCHC: 33.3 g/dL (ref 31.5–36.0)
MCV: 89.8 fL (ref 79.5–101.0)
MONO#: 0.2 10*3/uL (ref 0.1–0.9)
MONO%: 21 % — AB (ref 0.0–14.0)
NEUT#: 0.1 10*3/uL — CL (ref 1.5–6.5)
NEUT%: 7 % — ABNORMAL LOW (ref 38.4–76.8)
NRBC: 0 % (ref 0–0)
Platelets: 76 10*3/uL — ABNORMAL LOW (ref 145–400)
RBC: 2.84 10*6/uL — AB (ref 3.70–5.45)
RDW: 15.5 % — ABNORMAL HIGH (ref 11.2–14.5)
WBC: 1 10*3/uL — ABNORMAL LOW (ref 3.9–10.3)
lymph#: 0.6 10*3/uL — ABNORMAL LOW (ref 0.9–3.3)

## 2014-01-16 MED ORDER — CIPROFLOXACIN HCL 500 MG PO TABS: 500.0000 mg | ORAL_TABLET | Freq: Two times a day (BID) | ORAL | Status: DC

## 2014-01-16 MED ORDER — TRAMADOL HCL 50 MG PO TABS: 50.0000 mg | ORAL_TABLET | Freq: Four times a day (QID) | ORAL | Status: DC | PRN

## 2014-01-16 NOTE — Telephone Encounter (Signed)
Per [facility] staff phone call scheduled appts. Advised schedulers

## 2014-01-16 NOTE — Progress Notes (Signed)
Carmen Stone  [phone] [phone]) [phone]     ID: [name] OB: 1956-02-16  MR#: [id number]  LZJ#:[id number]  PCP: [name], MD GYN:   SU: [name] MD OTHER MD:   CHIEF COMPLAINT: Triple negative breast cancer, locally advanced CURRENT TREATMENT: Adjuvant chemotherapy  BREAST CANCER HISTORY: As per previously dictated Dr.Magrinat's note:  [name] presented to the emergency room on 10/22/2013 complaining of right breast pain and discharge. On exam the breast was diffusely erythematous with" orange peel" appearance. She was admitted for debridement and surgery he describes a skin defect in the lower inner quadrant measuring approximately 4 cm. When the breast was breast, air came out through the hole--there are photos included with the admission note. A chest x-ray was obtained which showed the right breast to be asymmetrically enlarged with extensive subcutaneous gas.  On the same day the patient underwent debridement of the wound, with the pathology ([id number] [id number]) showing high-grade carcinoma involving ulcerated skin. There was tumor within the dermal and subcutaneous lymphatics. The tumor was negative for estrogen, progesterone, and HER-2(signals ratio 1.24, number per cell 3.6), with an MIB-1 of 95%. Gross cystic disease fluid protein was also negative.  The patient was admitted and started on antibiotics. On 10/24/2013 an attempt was made at further debridement, but the inside of the breast essentially came out in hand falls. The pathology from this procedure(SZA [id number] and 26) again showed high-grade carcinoma.  On 10/25/2013 the patient underwent CT scans of the chest abdomen and pelvis. Aside from disease in the breast and right axilla there was no evidence of metastatic disease.  Accordingly Dr. [name] proceeded to right mastectomy with axillary lymph node dissection 10/26/2013. Results of this procedure ([id number])  showed a 15 cm defect in the middle of the breast, which is 4 prior necrotic tissue had been scoped out. There was nevertheless extensive invasive ductal carcinoma, grade 3, in the specimen and repeat or blastic panel was again triple negative. 2 of 16 axillary lymph nodes were involved by macrometastatic deposits. The patient was continued on antibiotics and eventually discharged 11/03/2013.  Her subsequent history is as detailed below  INTERVAL HISTORY: Carmen Stone returns today for followup of her breast cancer. She has been receiving chemotherapy with cyclophosphamide and doxorubicin and received the fourth and final cycle a week ago. Accordingly this is day 8 cycle 4 of 4 planned. She is here to discuss the upcoming chemotherapy with carboplatin and paclitaxel.  REVIEW OF SYSTEMS: [name] did remarkably well with the first part of her chemotherapy. She never had nausea or vomiting problems. She was fatigued for one or 2 days but she says she is working part-time for her [employer] and also at [employer]. She has problems with her dentures and she may need no once. Sometimes she has discomfort over the surgical bed and she takes tramadol for this. She does not like Aleve, which upsets her stomach, and Tylenol is not sufficient. She does not get constipated from the tramadol. Aside from this a detailed review of systems today was remarkably benign   PAST MEDICAL HISTORY: Past Medical History  Diagnosis Date  . Diabetes mellitus, type II   . Inflammatory breast cancer     PAST SURGICAL HISTORY: Past Surgical History  Procedure Laterality Date  . Cesarean section    . Ventral hernia repair  2011    repair incacerated VH with biologic mesh; cholecystectomy  . Cholecystectomy  2011  . Incision and drainage of  wound Right 10/22/2013    Procedure: IRRIGATION AND DRAINAGE AND DEBRIDEMENT RIGHT BREAST WOUND;  Surgeon: [name], MD;  Location: [facility];  Service: General;  Laterality: Right;  .  Breast biopsy Right 10/22/2013    Procedure: BREAST BIOPSY;  Surgeon: [name], MD;  Location: [facility];  Service: General;  Laterality: Right;  . Irrigation and debridement abscess Right 10/24/2013    Procedure: Right total mastectomy;  Surgeon: [name], MD;  Location: [facility];  Service: General;  Laterality: Right;  . Dressing change under anesthesia Right 10/26/2013    Procedure: COMPLETION MASTECTOMY, AXILLARY DISSECTION;  Surgeon: [name], MD;  Location: [facility];  Service: General;  Laterality: Right;  . Portacath placement N/A 11/16/2013    Procedure: INSERTION PORT-A-CATH;  Surgeon: [name], MD;  Location: [facility];  Service: General;  Laterality: N/A;    FAMILY HISTORY No family history on file. The patient's father was a heavy smoker and died from complications of emphysema at the age of 85. The patient's mother died from lung cancer at the age of 74. She also was a heavy smoker. The patient had one brother, no sisters. There is no history of breast or ovarian cancer in the family.  GYNECOLOGIC HISTORY:  Menarche age 5, first live birth age 19, the patient is GX P1. She went through the change of life approximately age 40. She did not take hormone replacement.  SOCIAL HISTORY:  [name] is divorced. She works for [employer] and also works a second job at [employer] on [employer]. She plans to be on disability during her chemoradiation treatments. She lives alone, but her daughter Carmen Stone frequently stays there overnight. 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 She spends her time between helping her mom and helping her grandfather, Carmen Stone, who lives in [place] and has significant dementia. Mr. Kesling was present at the 11/18/2013 visit only because Carmen Stone wanted to be present and otherwise no one would have been with Mr. Bacote. Kenly has no grandchildren. She attends a Nurse, adult  church    ADVANCED DIRECTIVES: In place. The patient has named her brother Carmen Stone as her healthcare power of attorney. Iona Beard can be reached at [phone]. He is a Pharmacist, hospital locally  HEALTH MAINTENANCE: History  Substance Use Topics  . Smoking status: Never Smoker   . Smokeless tobacco: Not on file  . Alcohol Use: No     Colonoscopy:  PAP:  Bone density:  Lipid panel:  No Known Allergies  Current Outpatient Prescriptions  Medication Sig Dispense Refill  . acetaminophen (TYLENOL) 325 MG tablet Take 650 mg by mouth every 6 (six) hours as needed for mild pain.      Marland Kitchen dexamethasone (DECADRON) 4 MG tablet Take 2 tablets by mouth once a day on the day after chemotherapy and then take 2 tablets two times a day for 2 days. Take with food.  30 tablet  1  . glucose blood test strip Use as instructed  100 each  12  . glucose monitoring kit (FREESTYLE) monitoring kit 1 each by Does not apply route as needed for other. Check blood sugars daily.  1 each  0  . Insulin Glargine (LANTUS SOLOSTAR) 100 UNIT/ML Solostar Pen Inject 10 Units into the skin daily at 10 pm.  15 mL  3  . lidocaine-prilocaine (EMLA) cream Apply 1 application topically as needed. Apply over port area 1-2 hours before treatment, cover  with plastic wrap  30 g  0  . linagliptin (TRADJENTA) 5 MG TABS tablet Take 1 tablet (5 mg total) by mouth daily.  30 tablet  0  . LORazepam (ATIVAN) 0.5 MG tablet Take 1 tablet (0.5 mg total) by mouth at bedtime as needed (Nausea or vomiting).  30 tablet  0  . ondansetron (ZOFRAN) 8 MG tablet Take 1 tablet (8 mg total) by mouth every 8 (eight) hours as needed for nausea or vomiting.  20 tablet  3  . oxyCODONE-acetaminophen (PERCOCET) 10-325 MG per tablet Take 1 tablet by mouth every 6 (six) hours as needed for pain.  30 tablet  0  . pantoprazole (PROTONIX) 40 MG tablet Take 1 tablet (40 mg total) by mouth daily at 6 (six) AM.  30 tablet  3  . prochlorperazine (COMPAZINE) 10 MG tablet Take 1  tablet (10 mg total) by mouth every 6 (six) hours as needed (Nausea or vomiting).  30 tablet  1  . traMADol (ULTRAM) 50 MG tablet Take 1 tablet (50 mg total) by mouth every 6 (six) hours as needed.  20 tablet  0  . traMADol (ULTRAM) 50 MG tablet Take 1-2 tablets (50-100 mg total) by mouth every 6 (six) hours as needed.  30 tablet  0   No current facility-administered medications for this visit.    OBJECTIVE: Middle-aged [place] American woman who appears stated age 58 Vitals:   01/16/14 1310  BP: 130/64  Pulse: 106  Temp: 98.9 F (37.2 C)  Resp: 18     Body mass index is 28.43 kg/(m^2).    ECOG FS:1 - Symptomatic but completely ambulatory  Sclerae unicteric, pupils equal and reactive Oropharynx clear and moist No cervical or supraclavicular adenopathy Lungs no rales or rhonchi Heart regular rate and rhythm Abd soft, nontender, positive bowel sounds MSK no focal spinal tenderness, no upper extremity lymphedema Neuro: nonfocal, well oriented, appropriate affect Breasts: The right breast is status post mastectomy. There is no evidence of local recurrence. The right axilla is benign. The left breast is unremarkable    LAB RESULTS:  CMP     Component Value Date/Time   NA 138 01/09/2014 0817   NA 142 11/16/2013 0847   K 4.2 01/09/2014 0817   K 4.1 11/16/2013 0847   CL 106 11/16/2013 0847   CO2 26 01/09/2014 0817   CO2 27 11/07/2013 0540   GLUCOSE 165* 01/09/2014 0817   GLUCOSE 149* 11/16/2013 0847   BUN 16.5 01/09/2014 0817   BUN 25* 11/16/2013 0847   CREATININE 0.7 01/09/2014 0817   CREATININE 1.20* 11/16/2013 0847   CALCIUM 9.3 01/09/2014 0817   CALCIUM 8.8 11/07/2013 0540   PROT 6.4 01/09/2014 0817   PROT 6.5 10/30/2013 1014   ALBUMIN 3.5 01/09/2014 0817   ALBUMIN 2.2* 10/30/2013 1014   AST 12 01/09/2014 0817   AST 20 10/30/2013 1014   ALT 14 01/09/2014 0817   ALT 6 10/30/2013 1014   ALKPHOS 85 01/09/2014 0817   ALKPHOS 70 10/30/2013 1014   BILITOT 0.20 01/09/2014 0817   BILITOT <0.2*  10/30/2013 1014   GFRNONAA 37* 11/07/2013 0540   GFRAA 43* 11/07/2013 0540    I No results found for this basename: SPEP,  UPEP,   kappa and lambda light chains    Lab Results  Component Value Date   WBC 1.0* 01/16/2014   NEUTROABS 0.1* 01/16/2014   HGB 8.5* 01/16/2014   HCT 25.5* 01/16/2014   MCV 89.8 01/16/2014   PLT  76 Large platelets present* 01/16/2014      Chemistry      Component Value Date/Time   NA 138 01/09/2014 0817   NA 142 11/16/2013 0847   K 4.2 01/09/2014 0817   K 4.1 11/16/2013 0847   CL 106 11/16/2013 0847   CO2 26 01/09/2014 0817   CO2 27 11/07/2013 0540   BUN 16.5 01/09/2014 0817   BUN 25* 11/16/2013 0847   CREATININE 0.7 01/09/2014 0817   CREATININE 1.20* 11/16/2013 0847      Component Value Date/Time   CALCIUM 9.3 01/09/2014 0817   CALCIUM 8.8 11/07/2013 0540   ALKPHOS 85 01/09/2014 0817   ALKPHOS 70 10/30/2013 1014   AST 12 01/09/2014 0817   AST 20 10/30/2013 1014   ALT 14 01/09/2014 0817   ALT 6 10/30/2013 1014   BILITOT 0.20 01/09/2014 0817   BILITOT <0.2* 10/30/2013 1014       No results found for this basename: LABCA2    No components found with this basename: LABCA125    No results found for this basename: INR,  in the last 168 hours  Urinalysis    Component Value Date/Time   COLORURINE ORANGE* 10/22/2013 0159   APPEARANCEUR CLOUDY* 10/22/2013 0159   LABSPEC 1.043* 10/22/2013 0159   PHURINE 5.5 10/22/2013 0159   GLUCOSEU >1000* 10/22/2013 0159   HGBUR MODERATE* 10/22/2013 0159   BILIRUBINUR MODERATE* 10/22/2013 0159   KETONESUR 15* 10/22/2013 0159   PROTEINUR 100* 10/22/2013 0159   UROBILINOGEN 2.0* 10/22/2013 0159   NITRITE NEGATIVE 10/22/2013 0159   LEUKOCYTESUR SMALL* 10/22/2013 0159    STUDIES: No results found.  ASSESSMENT: 58 y.o. [place] woman  #1. status post right modified radical mastectomy 10/26/2013 for a cT4, pT3 pN1, stage IIIB invasive ductal carcinoma, grade 3, triple negative, with an MIB-1 of 95% . #2. Initial chemotherapy with dose dense doxorubicin  and cyclophosphamide x 4 cycles completed 01/09/2014  #3. carboplatin and paclitaxel weekly x12 doses, to start 01/23/2014  #4  postmastectomy radiation to follow after completion of chemotherapy.  PLAN: Palyn did remarkably well with the first part of her chemotherapy. Hopefully the second part will be even easier on her. I am asking her to take the same medicines for nausea prophylaxis. After 2 cycles of carbo/Taxol we may want to simplify that and omit the dexamethasone. She was specifically warned regarding problems with peripheral neuropathy and alerted to let us know if any of those symptoms develop.  She has discomfort over the surgical bed, and he requested a refill of tramadol, which I was glad to provide. She also requested a refill on linagliptin, which was started when she was in the hospital. I was glad to do that also but requested she meet with Dr. Criss Rosales, her primary care physician, and discussed her diabetes management orally. There may be other medications that might be more effective for her to be on.  She will see [place] on a weekly basis with weekly lab work until she is done with her chemotherapy. After that she will proceed to radiation.  Chiquita has a good understanding of the overall plan. She agrees with it. She knows the goal of treatment in her case is cure. She will call with any problems that may develop before her next visit here.  Chauncey Cruel, MD  01/16/2014 1:20 PM

## 2014-01-23 ENCOUNTER — Ambulatory Visit (HOSPITAL_BASED_OUTPATIENT_CLINIC_OR_DEPARTMENT_OTHER): Payer: BC Managed Care – PPO

## 2014-01-23 ENCOUNTER — Encounter: Payer: Self-pay | Admitting: Hematology

## 2014-01-23 ENCOUNTER — Other Ambulatory Visit (HOSPITAL_BASED_OUTPATIENT_CLINIC_OR_DEPARTMENT_OTHER): Payer: BC Managed Care – PPO

## 2014-01-23 ENCOUNTER — Other Ambulatory Visit: Payer: Self-pay | Admitting: *Deleted

## 2014-01-23 ENCOUNTER — Ambulatory Visit (HOSPITAL_BASED_OUTPATIENT_CLINIC_OR_DEPARTMENT_OTHER): Payer: BC Managed Care – PPO | Admitting: Hematology

## 2014-01-23 ENCOUNTER — Encounter: Payer: Self-pay | Admitting: *Deleted

## 2014-01-23 VITALS — BP 162/86 | HR 68 | Temp 98.5°F | Resp 18

## 2014-01-23 VITALS — BP 147/76 | HR 89 | Temp 97.8°F | Resp 20 | Ht 64.0 in | Wt 168.8 lb

## 2014-01-23 DIAGNOSIS — Z171 Estrogen receptor negative status [ER-]: Secondary | ICD-10-CM

## 2014-01-23 DIAGNOSIS — IMO0001 Reserved for inherently not codable concepts without codable children: Secondary | ICD-10-CM

## 2014-01-23 DIAGNOSIS — C773 Secondary and unspecified malignant neoplasm of axilla and upper limb lymph nodes: Secondary | ICD-10-CM

## 2014-01-23 DIAGNOSIS — N611 Abscess of the breast and nipple: Secondary | ICD-10-CM

## 2014-01-23 DIAGNOSIS — R0902 Hypoxemia: Secondary | ICD-10-CM

## 2014-01-23 DIAGNOSIS — E119 Type 2 diabetes mellitus without complications: Secondary | ICD-10-CM

## 2014-01-23 DIAGNOSIS — C50919 Malignant neoplasm of unspecified site of unspecified female breast: Secondary | ICD-10-CM

## 2014-01-23 DIAGNOSIS — C50911 Malignant neoplasm of unspecified site of right female breast: Secondary | ICD-10-CM

## 2014-01-23 DIAGNOSIS — Z5111 Encounter for antineoplastic chemotherapy: Secondary | ICD-10-CM

## 2014-01-23 DIAGNOSIS — K219 Gastro-esophageal reflux disease without esophagitis: Principal | ICD-10-CM

## 2014-01-23 LAB — CBC WITH DIFFERENTIAL/PLATELET
BASO%: 1.4 % (ref 0.0–2.0)
BASOS ABS: 0.1 10*3/uL (ref 0.0–0.1)
EOS%: 0.2 % (ref 0.0–7.0)
Eosinophils Absolute: 0 10*3/uL (ref 0.0–0.5)
HEMATOCRIT: 27.9 % — AB (ref 34.8–46.6)
HEMOGLOBIN: 9 g/dL — AB (ref 11.6–15.9)
LYMPH%: 19 % (ref 14.0–49.7)
MCH: 30 pg (ref 25.1–34.0)
MCHC: 32.3 g/dL (ref 31.5–36.0)
MCV: 93 fL (ref 79.5–101.0)
MONO#: 0.8 10*3/uL (ref 0.1–0.9)
MONO%: 12.8 % (ref 0.0–14.0)
NEUT#: 4.3 10*3/uL (ref 1.5–6.5)
NEUT%: 66.6 % (ref 38.4–76.8)
PLATELETS: 233 10*3/uL (ref 145–400)
RBC: 3 10*6/uL — ABNORMAL LOW (ref 3.70–5.45)
RDW: 16 % — ABNORMAL HIGH (ref 11.2–14.5)
WBC: 6.4 10*3/uL (ref 3.9–10.3)
lymph#: 1.2 10*3/uL (ref 0.9–3.3)

## 2014-01-23 LAB — COMPREHENSIVE METABOLIC PANEL
ALK PHOS: 88 U/L (ref 39–117)
ALT: 18 U/L (ref 0–35)
AST: 12 U/L (ref 0–37)
Albumin: 3.5 g/dL (ref 3.5–5.2)
BUN: 12 mg/dL (ref 6–23)
CO2: 24 mEq/L (ref 19–32)
Calcium: 9.2 mg/dL (ref 8.4–10.5)
Chloride: 103 mEq/L (ref 96–112)
Creatinine, Ser: 0.7 mg/dL (ref 0.50–1.10)
Glucose, Bld: 235 mg/dL — ABNORMAL HIGH (ref 70–99)
POTASSIUM: 4.4 meq/L (ref 3.5–5.3)
Sodium: 141 mEq/L (ref 135–145)
Total Bilirubin: <0.2 mg/dL — ABNORMAL LOW (ref 0.2–1.2)
Total Protein: 6.5 g/dL (ref 6.0–8.3)

## 2014-01-23 MED ORDER — DEXAMETHASONE SODIUM PHOSPHATE 20 MG/5ML IJ SOLN
20.0000 mg | Freq: Once | INTRAMUSCULAR | Status: AC
  Administered 2014-01-23: 20 mg via INTRAVENOUS

## 2014-01-23 MED ORDER — FAMOTIDINE IN NACL 20-0.9 MG/50ML-% IV SOLN
INTRAVENOUS | Status: AC
  Filled 2014-01-23: qty 50

## 2014-01-23 MED ORDER — ONDANSETRON 16 MG/50ML IVPB (CHCC)
16.0000 mg | Freq: Once | INTRAVENOUS | Status: AC
  Administered 2014-01-23: 16 mg via INTRAVENOUS

## 2014-01-23 MED ORDER — PACLITAXEL CHEMO INJECTION 300 MG/50ML
80.0000 mg/m2 | Freq: Once | INTRAVENOUS | Status: AC
  Administered 2014-01-23: 150 mg via INTRAVENOUS
  Filled 2014-01-23: qty 25

## 2014-01-23 MED ORDER — HEPARIN SOD (PORK) LOCK FLUSH 100 UNIT/ML IV SOLN
500.0000 [IU] | Freq: Once | INTRAVENOUS | Status: AC | PRN
  Administered 2014-01-23: 500 [IU]
  Filled 2014-01-23: qty 5

## 2014-01-23 MED ORDER — SODIUM CHLORIDE 0.9 % IV SOLN
229.8000 mg | Freq: Once | INTRAVENOUS | Status: AC
  Administered 2014-01-23: 229.8 mg via INTRAVENOUS
  Filled 2014-01-23: qty 22.98

## 2014-01-23 MED ORDER — DIPHENHYDRAMINE HCL 50 MG/ML IJ SOLN
25.0000 mg | Freq: Once | INTRAMUSCULAR | Status: AC
  Administered 2014-01-23: 25 mg via INTRAVENOUS

## 2014-01-23 MED ORDER — DIPHENHYDRAMINE HCL 50 MG/ML IJ SOLN
INTRAMUSCULAR | Status: AC
  Filled 2014-01-23: qty 1

## 2014-01-23 MED ORDER — SODIUM CHLORIDE 0.9 % IV SOLN
Freq: Once | INTRAVENOUS | Status: AC
  Administered 2014-01-23: 12:00:00 via INTRAVENOUS

## 2014-01-23 MED ORDER — SODIUM CHLORIDE 0.9 % IJ SOLN
10.0000 mL | INTRAMUSCULAR | Status: DC | PRN
  Administered 2014-01-23: 10 mL
  Filled 2014-01-23: qty 10

## 2014-01-23 MED ORDER — ONDANSETRON 16 MG/50ML IVPB (CHCC)
INTRAVENOUS | Status: AC
  Filled 2014-01-23: qty 16

## 2014-01-23 MED ORDER — DEXAMETHASONE SODIUM PHOSPHATE 20 MG/5ML IJ SOLN
INTRAMUSCULAR | Status: AC
  Filled 2014-01-23: qty 5

## 2014-01-23 MED ORDER — FAMOTIDINE IN NACL 20-0.9 MG/50ML-% IV SOLN
20.0000 mg | Freq: Once | INTRAVENOUS | Status: AC
  Administered 2014-01-23: 20 mg via INTRAVENOUS

## 2014-01-23 NOTE — Progress Notes (Signed)
[place]  [phone] [phone]     ID: [name] OB: 06-06-1956  MR#: [id number]  YCX#:[id number]  PCP: [name], MD GYN:   SU: [name] MD    CHIEF COMPLAINT: Triple negative breast cancer, locally advanced CURRENT TREATMENT: Adjuvant chemotherapy. To start Carboplatin and Taxol weekly regimen today (1/12) today.  BREAST CANCER HISTORY: As per previously dictated Dr.Magrinat's note:  Ms. Beauchesne presented to the emergency room on 10/22/2013 complaining of right breast pain and discharge. On exam the breast was diffusely erythematous with" orange peel" appearance. She was admitted for debridement and surgery he describes a skin defect in the lower inner quadrant measuring approximately 4 cm. When the breast was breast, air came out through the hole--there are photos included with the admission note. A chest x-ray was obtained which showed the right breast to be asymmetrically enlarged with extensive subcutaneous gas.  On the same day the patient underwent debridement of the wound, with the pathology (SZA [id number]) showing high-grade carcinoma involving ulcerated skin. There was tumor within the dermal and subcutaneous lymphatics. The tumor was negative for estrogen, progesterone, and HER-2(signals ratio 1.24, number per cell 3.6), with an MIB-1 of 95%. Gross cystic disease fluid protein was also negative.  The patient was admitted and started on antibiotics. On 10/24/2013 an attempt was made at further debridement, but the inside of the breast essentially came out in hand falls. The pathology from this procedure(SZA 15-20 and 26) again showed high-grade carcinoma.  On 10/25/2013 the patient underwent CT scans of the chest abdomen and pelvis. Aside from disease in the breast and right axilla there was no evidence of metastatic disease.     Accordingly Dr. [name] proceeded to right mastectomy with axillary lymph node  dissection 10/26/2013. Results of this procedure ([id number]) showed a 15 cm defect in the middle of the breast, which is 4 prior necrotic tissue had been scoped out. There was nevertheless extensive invasive ductal carcinoma, grade 3, in the specimen and repeat or blastic panel was again triple negative. 2 of 16 axillary lymph nodes were involved by macrometastatic deposits. The patient was continued on antibiotics and eventually discharged 11/03/2013.   Initial chemotherapy with dose dense doxorubicin and cyclophosphamide x 4 cycles completed 01/09/2014. Now plan is to give carboplatin and paclitaxel weekly x12 doses, to start 01/23/2014 (today) and postmastectomy radiation to follow after completion of chemotherapy.  INTERVAL HISTORY: Oswin returns today for followup of her breast cancer. She has been receiving chemotherapy with cyclophosphamide and doxorubicin and received the fourth and final cycle a week ago. Patient is doing well. She did not pick her scripts for protonix and Tramadol and we checked and told patient that Dr Jana Hakim have called them in and she just needs to pick from her pharmacy. She does c/o mild stiffness in her right shoulder and headache which went away with tylenol. She has alopecia complete now from [facility] chemo.  REVIEW OF SYSTEMS: Nastashia did remarkably well with the first part of her chemotherapy. She never had nausea or vomiting problems. She was fatigued but now recovered. No chest pain, palpitations, SOB, ankle edema, or GI symptoms. She does not like Aleve, which upsets her stomach, and Tylenol is not sufficient. She does not get constipated from the tramadol. Aside from this a detailed review of systems today was remarkably benign   PAST MEDICAL HISTORY: Past Medical History  Diagnosis Date  . Diabetes mellitus, type II   . Inflammatory breast cancer  PAST SURGICAL HISTORY: Past Surgical History  Procedure Laterality Date  . Cesarean section    .  Ventral hernia repair  2011    repair incacerated VH with biologic mesh; cholecystectomy  . Cholecystectomy  2011  . Incision and drainage of wound Right 10/22/2013    Procedure: IRRIGATION AND DRAINAGE AND DEBRIDEMENT RIGHT BREAST WOUND;  Surgeon: [name], MD;  Location: [facility];  Service: General;  Laterality: Right;  . Breast biopsy Right 10/22/2013    Procedure: BREAST BIOPSY;  Surgeon: [name], MD;  Location: [place];  Service: General;  Laterality: Right;  . Irrigation and debridement abscess Right 10/24/2013    Procedure: Right total mastectomy;  Surgeon: [name], MD;  Location: [place];  Service: General;  Laterality: Right;  . Dressing change under anesthesia Right 10/26/2013    Procedure: COMPLETION MASTECTOMY, AXILLARY DISSECTION;  Surgeon: [name], MD;  Location: [facility];  Service: General;  Laterality: Right;  . Portacath placement N/A 11/16/2013    Procedure: INSERTION PORT-A-CATH;  Surgeon: [name], MD;  Location: [facility];  Service: General;  Laterality: N/A;    FAMILY HISTORY No family history on file. The patient's father was a heavy smoker and died from complications of emphysema at the age of 60. The patient's mother died from lung cancer at the age of 37. She also was a heavy smoker. The patient had one brother, no sisters. There is no history of breast or ovarian cancer in the family.  GYNECOLOGIC HISTORY:  Menarche age 27, first live birth age 62, the patient is GX P1. She went through the change of life approximately age 58. She did not take hormone replacement.  SOCIAL HISTORY:  Shasha is divorced. She works for [employer] and also works a second job at [employer] on [employer]. She plans to be on disability during her chemoradiation treatments. She lives alone, but her daughter [name] frequently stays there overnight. [name] was [employer]. 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 She spends her time  between helping her mom and helping her grandfather, [place] Oborn, who lives in [place] and has significant dementia. Mr. Sween was present at the 11/18/2013 visit only because [name] wanted to be present and otherwise no one would have been with Mr. Panozzo. Marcianne has no grandchildren. She attends a [employer] adult church   ADVANCED DIRECTIVES: In place. The patient has named her brother Clide Cliff as her healthcare power of attorney. Iona Beard can be reached at [phone]. He is a Pharmacist, hospital locally  HEALTH MAINTENANCE: History  Substance Use Topics  . Smoking status: Never Smoker   . Smokeless tobacco: Not on file  . Alcohol Use: No     Colonoscopy:  PAP:  Bone density:  Lipid panel:  No Known Allergies  Current Outpatient Prescriptions  Medication Sig Dispense Refill  . acetaminophen (TYLENOL) 325 MG tablet Take 650 mg by mouth every 6 (six) hours as needed for mild pain.      . ciprofloxacin (CIPRO) 500 MG tablet Take 1 tablet (500 mg total) by mouth 2 (two) times daily.  10 tablet  0  . dexamethasone (DECADRON) 4 MG tablet Take 2 tablets by mouth once a day on the day after chemotherapy and then take 2 tablets two times a day for 2 days. Take with food.  30 tablet  1  . glucose blood test strip Use as instructed  100 each  12  . glucose monitoring kit (FREESTYLE)  monitoring kit 1 each by Does not apply route as needed for other. Check blood sugars daily.  1 each  0  . Insulin Glargine (LANTUS SOLOSTAR) 100 UNIT/ML Solostar Pen Inject 10 Units into the skin daily at 10 pm.  15 mL  3  . linagliptin (TRADJENTA) 5 MG TABS tablet Take 1 tablet (5 mg total) by mouth daily.  30 tablet  0  . LORazepam (ATIVAN) 0.5 MG tablet Take 1 tablet (0.5 mg total) by mouth at bedtime as needed (Nausea or vomiting).  30 tablet  0  . ondansetron (ZOFRAN) 8 MG tablet Take 1 tablet (8 mg total) by mouth every 8 (eight) hours as needed for nausea or vomiting.  20 tablet  3  . pantoprazole  (PROTONIX) 40 MG tablet Take 1 tablet (40 mg total) by mouth daily at 6 (six) AM.  30 tablet  3  . traMADol (ULTRAM) 50 MG tablet Take 1 tablet (50 mg total) by mouth every 6 (six) hours as needed.  20 tablet  0   No current facility-administered medications for this visit.   Facility-Administered Medications Ordered in Other Visits  Medication Dose Route Frequency Provider Last Rate Last Dose  . CARBOplatin (PARAPLATIN) 229.8 mg in sodium chloride 0.9 % 100 mL chemo infusion  229.8 mg Intravenous Once Chauncey Cruel, MD 246 mL/hr at 01/23/14 1420 229.8 mg at 01/23/14 1420  . heparin lock flush 100 unit/mL  500 Units Intracatheter Once PRN Chauncey Cruel, MD      . sodium chloride 0.9 % injection 10 mL  10 mL Intracatheter PRN Chauncey Cruel, MD        OBJECTIVE: Middle-aged [place] American woman who appears stated age 20 Vitals:   01/23/14 1007  BP: 147/76  Pulse: 89  Temp: 97.8 F (36.6 C)  Resp: 20     Body mass index is 28.96 kg/(m^2).    ECOG FS:1 - Symptomatic but completely ambulatory  Sclerae unicteric, pupils equal and reactive Oropharynx clear and moist No cervical or supraclavicular adenopathy Lungs no rales or rhonchi Heart regular rate and rhythm Abd soft, nontender, positive bowel sounds MSK no focal spinal tenderness, no upper extremity lymphedema Neuro: nonfocal, well oriented, appropriate affect Breasts: Deferred  LAB RESULTS:        CT chest, abdomen and pelvis 10/26/2013:   1. Postoperative changes of recent right breast surgery with packed open wound which has a thick rind of soft tissue, some of which  enhances, a 1.3 x 1.5 cm enhancing lesion in the medial aspect of the right breast which may represent residual tumor or a malignant  lymph node, and multiple enlarged right axillary nodes concerning for axillary nodal metastases.  2. No other definite signs of metastatic disease in the chest, abdomen or pelvis.  3. Multiple small ventral  hernias containing several loops of small bowel, as well as the mid transverse colon, without associated bowel  incarceration or obstruction at this time.  4. Trace bilateral pleural effusions with mild dependent subsegmental atelectasis in the lower lobes of the lungs bilaterally.  5. Status post cholecystectomy and hysterectomy.        ASSESSMENT/PLAN: 58 y.o. [place] woman  #1. status post right modified radical mastectomy 10/26/2013 for a cT4, pT3 pN1, stage IIIB invasive ductal carcinoma, grade 3, triple negative, with an MIB-1 of 95% . #2. Initial chemotherapy with dose dense doxorubicin and cyclophosphamide x 4 cycles completed 01/09/2014  #3. carboplatin and paclitaxel weekly x12 doses, to start  Today08/03/2014. I did a physical exam and checked her labs and it is OK to proceed with chemo today. She has mild anemia related to chemotherapy.  #4  postmastectomy radiation to follow after completion of chemotherapy.  #5 We will continue to monitor her labs weekly and do a roxicity screening and a visit with a provider.     Bernadene Bell, MD Medical Hematologist/Oncologist [place] Pager: [phone] Office No: [phone] 01/23/2014 2:42 PM

## 2014-01-23 NOTE — Patient Instructions (Addendum)
[facility] Discharge Instructions for Patients Receiving Chemotherapy  Today you received the following chemotherapy agents: Taxol and Carboplatin  To help prevent nausea and vomiting after your treatment, we encourage you to take your nausea medication: zofran 8 mg every 8 hours as needed.   If you develop nausea and vomiting that is not controlled by your nausea medication, call the clinic.   BELOW ARE SYMPTOMS THAT SHOULD BE REPORTED IMMEDIATELY:  *FEVER GREATER THAN 100.5 F  *CHILLS WITH OR WITHOUT FEVER  NAUSEA AND VOMITING THAT IS NOT CONTROLLED WITH YOUR NAUSEA MEDICATION  *UNUSUAL SHORTNESS OF BREATH  *UNUSUAL BRUISING OR BLEEDING  TENDERNESS IN MOUTH AND THROAT WITH OR WITHOUT PRESENCE OF ULCERS  *URINARY PROBLEMS  *BOWEL PROBLEMS  UNUSUAL RASH Items with * indicate a potential emergency and should be followed up as soon as possible.  Feel free to call the clinic you have any questions or concerns. The clinic phone number is [phone].

## 2014-01-23 NOTE — Progress Notes (Signed)
[place] Work  Clinical Social Work was referred by Futures trader for financial concerns and assessment of psychosocial needs.  Clinical Social Worker met with patient in the infusion room at [facility] to offer support and assess for needs.  Patient stated she is currently working part-time, but was working full-time prior to her diagnosis.  Patient expressed increased financial stress due to her decreased income, increasing medical bills, and daily living expenses.  Patient has been approved for the [place].  CSW and patient discussed additional financial assistance resources/foundations.  CSW and patient reviewed applications and requirements.  Patient plans to gather required documentation and complete the patient portion of the applications.  CSW and patient are scheduled to meet at pt's next appointment.  CSW encouraged patient to call with any questions or concerns.              Clinical Social Work interventions: Publishing copy in [employer]  [employer] [place], MSW, [employer] Clinical Social Worker [employer] [phone]

## 2014-01-23 NOTE — Progress Notes (Signed)
Tolerated 1st time Taxol/Carbo without any problems during treatment time. VSS.

## 2014-01-26 ENCOUNTER — Telehealth: Payer: Self-pay | Admitting: Hematology

## 2014-01-26 NOTE — Telephone Encounter (Signed)
appts for 8/17 already on schedule. not other orders per 8/10 pof.

## 2014-01-30 ENCOUNTER — Other Ambulatory Visit (HOSPITAL_BASED_OUTPATIENT_CLINIC_OR_DEPARTMENT_OTHER): Payer: BC Managed Care – PPO

## 2014-01-30 ENCOUNTER — Other Ambulatory Visit: Payer: Self-pay | Admitting: *Deleted

## 2014-01-30 ENCOUNTER — Encounter: Payer: Self-pay | Admitting: Hematology

## 2014-01-30 ENCOUNTER — Ambulatory Visit: Payer: BC Managed Care – PPO

## 2014-01-30 ENCOUNTER — Telehealth: Payer: Self-pay | Admitting: Hematology

## 2014-01-30 ENCOUNTER — Ambulatory Visit (HOSPITAL_BASED_OUTPATIENT_CLINIC_OR_DEPARTMENT_OTHER): Payer: BC Managed Care – PPO | Admitting: Hematology

## 2014-01-30 VITALS — BP 130/62 | HR 101 | Temp 98.6°F | Resp 18 | Ht 64.0 in | Wt 166.1 lb

## 2014-01-30 DIAGNOSIS — C50319 Malignant neoplasm of lower-inner quadrant of unspecified female breast: Secondary | ICD-10-CM

## 2014-01-30 DIAGNOSIS — N611 Abscess of the breast and nipple: Secondary | ICD-10-CM

## 2014-01-30 DIAGNOSIS — C50919 Malignant neoplasm of unspecified site of unspecified female breast: Secondary | ICD-10-CM

## 2014-01-30 DIAGNOSIS — D702 Other drug-induced agranulocytosis: Secondary | ICD-10-CM | POA: Insufficient documentation

## 2014-01-30 DIAGNOSIS — Z5189 Encounter for other specified aftercare: Secondary | ICD-10-CM

## 2014-01-30 DIAGNOSIS — C773 Secondary and unspecified malignant neoplasm of axilla and upper limb lymph nodes: Secondary | ICD-10-CM

## 2014-01-30 DIAGNOSIS — Z171 Estrogen receptor negative status [ER-]: Secondary | ICD-10-CM

## 2014-01-30 DIAGNOSIS — T451X5A Adverse effect of antineoplastic and immunosuppressive drugs, initial encounter: Secondary | ICD-10-CM

## 2014-01-30 DIAGNOSIS — R5381 Other malaise: Secondary | ICD-10-CM

## 2014-01-30 DIAGNOSIS — R0902 Hypoxemia: Secondary | ICD-10-CM

## 2014-01-30 DIAGNOSIS — D6481 Anemia due to antineoplastic chemotherapy: Secondary | ICD-10-CM

## 2014-01-30 DIAGNOSIS — R5383 Other fatigue: Secondary | ICD-10-CM

## 2014-01-30 DIAGNOSIS — E119 Type 2 diabetes mellitus without complications: Secondary | ICD-10-CM

## 2014-01-30 LAB — COMPREHENSIVE METABOLIC PANEL (CC13)
ALT: 11 U/L (ref 0–55)
AST: 12 U/L (ref 5–34)
Albumin: 3.5 g/dL (ref 3.5–5.0)
Alkaline Phosphatase: 76 U/L (ref 40–150)
Anion Gap: 7 mEq/L (ref 3–11)
BUN: 14.7 mg/dL (ref 7.0–26.0)
CALCIUM: 9.1 mg/dL (ref 8.4–10.4)
CO2: 27 mEq/L (ref 22–29)
CREATININE: 0.6 mg/dL (ref 0.6–1.1)
Chloride: 107 mEq/L (ref 98–109)
Glucose: 133 mg/dl (ref 70–140)
Potassium: 4 mEq/L (ref 3.5–5.1)
Sodium: 141 mEq/L (ref 136–145)
Total Protein: 6.3 g/dL — ABNORMAL LOW (ref 6.4–8.3)

## 2014-01-30 LAB — CBC WITH DIFFERENTIAL/PLATELET
BASO%: 9 % — ABNORMAL HIGH (ref 0.0–2.0)
Basophils Absolute: 0.2 10*3/uL — ABNORMAL HIGH (ref 0.0–0.1)
EOS%: 0.5 % (ref 0.0–7.0)
Eosinophils Absolute: 0 10*3/uL (ref 0.0–0.5)
HEMATOCRIT: 26.9 % — AB (ref 34.8–46.6)
HGB: 9 g/dL — ABNORMAL LOW (ref 11.6–15.9)
LYMPH%: 23.4 % (ref 14.0–49.7)
MCH: 30.7 pg (ref 25.1–34.0)
MCHC: 33.5 g/dL (ref 31.5–36.0)
MCV: 91.5 fL (ref 79.5–101.0)
MONO#: 0.6 10*3/uL (ref 0.1–0.9)
MONO%: 24.9 % — ABNORMAL HIGH (ref 0.0–14.0)
NEUT#: 1 10*3/uL — ABNORMAL LOW (ref 1.5–6.5)
NEUT%: 42.2 % (ref 38.4–76.8)
Platelets: 282 10*3/uL (ref 145–400)
RBC: 2.94 10*6/uL — ABNORMAL LOW (ref 3.70–5.45)
RDW: 17.6 % — ABNORMAL HIGH (ref 11.2–14.5)
WBC: 2.5 10*3/uL — ABNORMAL LOW (ref 3.9–10.3)
lymph#: 0.6 10*3/uL — ABNORMAL LOW (ref 0.9–3.3)

## 2014-01-30 MED ORDER — TBO-FILGRASTIM 300 MCG/0.5ML ~~LOC~~ SOSY
300.0000 ug | PREFILLED_SYRINGE | Freq: Once | SUBCUTANEOUS | Status: AC
  Administered 2014-01-30: 300 ug via SUBCUTANEOUS
  Filled 2014-01-30: qty 0.5

## 2014-01-30 MED ORDER — TRAMADOL HCL 50 MG PO TABS: 50.0000 mg | ORAL_TABLET | Freq: Four times a day (QID) | ORAL | Status: DC | PRN

## 2014-01-30 MED ORDER — DEXAMETHASONE 4 MG PO TABS: ORAL_TABLET | ORAL | Status: DC

## 2014-01-30 NOTE — Progress Notes (Signed)
Carmen Stone  [phone] [phone]     ID: Carmen Stone OB: 12/13/1955  MR#: [id number]  CSN#:[id number]  PCP: Stone,Carmen VINCENT, MD GYN:   SU: Carmen Wakefield MD    CHIEF COMPLAINT: Triple negative breast cancer, locally advanced CURRENT TREATMENT: Adjuvant chemotherapy. To start Carboplatin and Taxol weekly regimen. Week 2 or cycle 2 cancelled today and delayed till next week for low white count.  BREAST CANCER HISTORY: As per previously dictated Carmen Stone's note:  Carmen Stone presented to the emergency room on 10/22/2013 complaining of right breast pain and discharge. On exam the breast was diffusely erythematous with" orange peel" appearance. She was admitted for debridement and surgery he describes a skin defect in the lower inner quadrant measuring approximately 4 cm. When the breast was breast, air came out through the hole--there are photos included with the admission note. A chest x-ray was obtained which showed the right breast to be asymmetrically enlarged with extensive subcutaneous gas.  On the same day the patient underwent debridement of the wound, with the pathology (SZA 15-2010) showing high-grade carcinoma involving ulcerated skin. There was tumor within the dermal and subcutaneous lymphatics. The tumor was negative for estrogen, progesterone, and HER-2(signals ratio 1.24, number per cell 3.6), with an MIB-1 of 95%. Gross cystic disease fluid protein was also negative.  The patient was admitted and started on antibiotics. On 10/24/2013 an attempt was made at further debridement, but the inside of the breast essentially came out in hand falls. The pathology from this procedure(SZA 15-20 and 26) again showed high-grade carcinoma.  On 10/25/2013 the patient underwent CT scans of the chest abdomen and pelvis. Aside from disease in the breast and right axilla there was no evidence of metastatic disease.     Accordingly Dr.  Wakefield proceeded to right mastectomy with axillary lymph node dissection 10/26/2013. Results of this procedure (SZA [id number]) showed a 15 cm defect in the middle of the breast, which is 4 prior necrotic tissue had been scoped out. There was nevertheless extensive invasive ductal carcinoma, grade 3, in the specimen and repeat or blastic panel was again triple negative. 2 of 16 axillary lymph nodes were involved by macrometastatic deposits. The patient was continued on antibiotics and eventually discharged 11/03/2013.   Initial chemotherapy with dose dense doxorubicin and cyclophosphamide x 4 cycles completed 01/09/2014. Now plan is to give carboplatin and paclitaxel weekly x12 doses, started 01/23/2014 (today) and postmastectomy radiation to follow after completion of chemotherapy.  INTERVAL HISTORY: Carmen Stone returns today for followup of her breast cancer on chemotherapy. She has completed chemotherapy with cyclophosphamide and doxorubicin and received the fourth and final cycle. She got 1 cycle of Carboplatin and Taxol. Patient is doing well. She did not pick her scripts for protonix and Tramadol and we checked and told patient that Dr Magrinat have called them in and she just needs to pick from her pharmacy. She does c/o mild stiffness in her right shoulder and headache which went away with tylenol. She has alopecia complete now from [facility] chemo.She was due for week 2 of carboplatin and taxol but her labs indicated that her WBC was 2500 and ANC 1000 so I am delaying her week 2 chemo today and giving her 2 days of neupogen shot to allow white cell recovery. Hemoglobin is 9.0 gm stable and platelets are 282,000. Chemistry looks normal. She did get a script on tramadol and dexamethasone was called in. She has some post-mastectomy surgical pain 2-3/10.  REVIEW OF   SYSTEMS: Carmen Stone did remarkably well with the first part of her chemotherapy. She never had nausea or vomiting problems. She was fatigued but now  recovered. No chest pain, palpitations, SOB, ankle edema, or GI symptoms. She does not like Aleve, which upsets her stomach, and Tylenol is not sufficient. She does not get constipated from the tramadol. Aside from this a detailed review of systems today was remarkably benign   PAST MEDICAL HISTORY: Past Medical History  Diagnosis Date  . Diabetes mellitus, type II   . Inflammatory breast cancer     PAST SURGICAL HISTORY: Past Surgical History  Procedure Laterality Date  . Cesarean section    . Ventral hernia repair  2011    repair incacerated VH with biologic mesh; cholecystectomy  . Cholecystectomy  2011  . Incision and drainage of wound Right 10/22/2013    Procedure: IRRIGATION AND DRAINAGE AND DEBRIDEMENT RIGHT BREAST WOUND;  Surgeon: Carmen Curry, MD;  Location: [facility];  Service: General;  Laterality: Right;  . Breast biopsy Right 10/22/2013    Procedure: BREAST BIOPSY;  Surgeon: Carmen Curry, MD;  Location: [facility];  Service: General;  Laterality: Right;  . Irrigation and debridement abscess Right 10/24/2013    Procedure: Right total mastectomy;  Surgeon: Carmen Bookbinder, MD;  Location: [facility];  Service: General;  Laterality: Right;  . Dressing change under anesthesia Right 10/26/2013    Procedure: COMPLETION MASTECTOMY, AXILLARY DISSECTION;  Surgeon: Carmen Bookbinder, MD;  Location: [facility];  Service: General;  Laterality: Right;  . Portacath placement N/A 11/16/2013    Procedure: INSERTION PORT-A-CATH;  Surgeon: Carmen Bookbinder, MD;  Location: [facility];  Service: General;  Laterality: N/A;    FAMILY HISTORY No family history on file. The patient's father was a heavy smoker and died from complications of emphysema at the age of 34. The patient's mother died from lung cancer at the age of 47. She also was a heavy smoker. The patient had one brother, no sisters. There is no history of breast or ovarian cancer in the family.  GYNECOLOGIC HISTORY:  Menarche age 56,  first live birth age 68, the patient is GX P1. She went through the change of life approximately age 16. She did not take hormone replacement.  SOCIAL HISTORY:  Carmen Stone is divorced. She works for [employer] and also works a second job at [employer] on [employer]. She plans to be on disability during her chemoradiation treatments. She lives alone, but her daughter Carmen Stone frequently stays there overnight. 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 She spends her time between helping her mom and helping her grandfather, Carmen Stone, who lives in [place] and has significant dementia. Mr. Ottey was present at the 11/18/2013 visit only because Carmen Stone wanted to be present and otherwise no one would have been with Carmen Stone. Cristina has no grandchildren. She attends a Nurse, adult church   ADVANCED DIRECTIVES: In place. The patient has named her brother Carmen Stone as her healthcare power of attorney. Iona Beard can be reached at [phone]. He is a Pharmacist, hospital locally  HEALTH MAINTENANCE: History  Substance Use Topics  . Smoking status: Never Smoker   . Smokeless tobacco: Not on file  . Alcohol Use: No     Colonoscopy:  PAP:  Bone density:  Lipid panel:  No Known Allergies  Current Outpatient Prescriptions  Medication Sig Dispense Refill  . acetaminophen (TYLENOL) 325 MG tablet Take 650 mg by mouth every  6 (six) hours as needed for mild pain.      . dexamethasone (DECADRON) 4 MG tablet Take 2 tablets by mouth once a day on the day after chemotherapy and then take 2 tablets two times a day for 2 days. Take with food.  30 tablet  1  . glucose blood test strip Use as instructed  100 each  12  . glucose monitoring kit (FREESTYLE) monitoring kit 1 each by Does not apply route as needed for other. Check blood sugars daily.  1 each  0  . Insulin Glargine (LANTUS SOLOSTAR) 100 UNIT/ML Solostar Pen Inject 10 Units into the skin daily at 10 pm.  15 mL  3    . linagliptin (TRADJENTA) 5 MG TABS tablet Take 1 tablet (5 mg total) by mouth daily.  30 tablet  0  . LORazepam (ATIVAN) 0.5 MG tablet Take 1 tablet (0.5 mg total) by mouth at bedtime as needed (Nausea or vomiting).  30 tablet  0  . ondansetron (ZOFRAN) 8 MG tablet Take 1 tablet (8 mg total) by mouth every 8 (eight) hours as needed for nausea or vomiting.  20 tablet  3  . pantoprazole (PROTONIX) 40 MG tablet Take 1 tablet (40 mg total) by mouth daily at 6 (six) AM.  30 tablet  3  . traMADol (ULTRAM) 50 MG tablet Take 1 tablet (50 mg total) by mouth every 6 (six) hours as needed.  30 tablet  0   No current facility-administered medications for this visit.    OBJECTIVE: Middle-aged African American woman who appears stated age Filed Vitals:   01/30/14 1031  BP: 130/62  Pulse: 101  Temp: 98.6 F (37 C)  Resp: 18     Body mass index is 28.5 kg/(m^2).    ECOG FS:1 - Symptomatic but completely ambulatory  Sclerae unicteric, pupils equal and reactive Oropharynx clear and moist No cervical or supraclavicular adenopathy Lungs no rales or rhonchi Heart regular rate and rhythm Abd soft, nontender, positive bowel sounds MSK no focal spinal tenderness, no upper extremity lymphedema Neuro: nonfocal, well oriented, appropriate affect Breasts: Deferred  LAB RESULTS:        CT chest, abdomen and pelvis 10/26/2013:   1. Postoperative changes of recent right breast surgery with packed open wound which has a thick rind of soft tissue, some of which  enhances, a 1.3 x 1.5 cm enhancing lesion in the medial aspect of the right breast which may represent residual tumor or a malignant  lymph node, and multiple enlarged right axillary nodes concerning for axillary nodal metastases.  2. No other definite signs of metastatic disease in the chest, abdomen or pelvis.  3. Multiple small ventral hernias containing several loops of small bowel, as well as the mid transverse colon, without associated  bowel  incarceration or obstruction at this time.  4. Trace bilateral pleural effusions with mild dependent subsegmental atelectasis in the lower lobes of the lungs bilaterally.  5. Status post cholecystectomy and hysterectomy.        ASSESSMENT/PLAN: 58 y.o. [place] woman  #1. status post right modified radical mastectomy 10/26/2013 for a cT4, pT3 pN1, stage IIIB invasive ductal carcinoma, grade 3, triple negative, with an MIB-1 of 95% . #2. Initial chemotherapy with dose dense doxorubicin and cyclophosphamide x 4 cycles completed 01/09/2014  #3. carboplatin and paclitaxel weekly x12 doses, to started on 01/23/2014. Today week 2 of her chemo, the white count and ANC is low so I will delay her chemo by   1 week and give her 2 days of neupogen. I will also recommend adding neupogen 2 days after each week of her remaining chemotherapy.  I did a physical exam and checked her labs. She has mild stable anemia related to chemotherapy.  #4  postmastectomy radiation to follow after completion of chemotherapy.  #5 We will continue to monitor her labs weekly and do a toxicity screening and a visit with a provider. She was given a script for Tramadol today and dexamethasone was called in to her pharmacy.  #6 RTC next week for week 2 of carboplatin and taxol.    Aasim Sehbai, MD Medical Hematologist/Oncologist [facility] Pager: [phone] Office No: [phone] 01/30/2014 11:27 AM 

## 2014-01-30 NOTE — Patient Instructions (Signed)

## 2014-01-30 NOTE — Telephone Encounter (Signed)
, °

## 2014-01-31 ENCOUNTER — Ambulatory Visit (HOSPITAL_BASED_OUTPATIENT_CLINIC_OR_DEPARTMENT_OTHER): Payer: BC Managed Care – PPO

## 2014-01-31 VITALS — BP 137/63 | HR 102 | Temp 98.6°F

## 2014-01-31 DIAGNOSIS — C773 Secondary and unspecified malignant neoplasm of axilla and upper limb lymph nodes: Secondary | ICD-10-CM

## 2014-01-31 DIAGNOSIS — C50919 Malignant neoplasm of unspecified site of unspecified female breast: Secondary | ICD-10-CM

## 2014-01-31 DIAGNOSIS — Z5189 Encounter for other specified aftercare: Secondary | ICD-10-CM

## 2014-01-31 MED ORDER — TBO-FILGRASTIM 300 MCG/0.5ML ~~LOC~~ SOSY
300.0000 ug | PREFILLED_SYRINGE | Freq: Once | SUBCUTANEOUS | Status: AC
  Administered 2014-01-31: 300 ug via SUBCUTANEOUS
  Filled 2014-01-31: qty 0.5

## 2014-02-06 ENCOUNTER — Other Ambulatory Visit: Payer: BC Managed Care – PPO

## 2014-02-06 ENCOUNTER — Encounter: Payer: Self-pay | Admitting: Hematology

## 2014-02-06 ENCOUNTER — Other Ambulatory Visit: Payer: Self-pay | Admitting: *Deleted

## 2014-02-06 ENCOUNTER — Ambulatory Visit (HOSPITAL_BASED_OUTPATIENT_CLINIC_OR_DEPARTMENT_OTHER): Payer: Self-pay | Admitting: Nurse Practitioner

## 2014-02-06 ENCOUNTER — Ambulatory Visit (HOSPITAL_BASED_OUTPATIENT_CLINIC_OR_DEPARTMENT_OTHER): Payer: BC Managed Care – PPO | Admitting: Hematology

## 2014-02-06 ENCOUNTER — Ambulatory Visit (HOSPITAL_BASED_OUTPATIENT_CLINIC_OR_DEPARTMENT_OTHER): Payer: BC Managed Care – PPO

## 2014-02-06 ENCOUNTER — Other Ambulatory Visit (HOSPITAL_BASED_OUTPATIENT_CLINIC_OR_DEPARTMENT_OTHER): Payer: BC Managed Care – PPO

## 2014-02-06 VITALS — BP 135/78 | HR 97 | Temp 98.6°F | Resp 18 | Ht 64.0 in | Wt 170.1 lb

## 2014-02-06 VITALS — BP 153/78 | HR 81

## 2014-02-06 DIAGNOSIS — C773 Secondary and unspecified malignant neoplasm of axilla and upper limb lymph nodes: Secondary | ICD-10-CM

## 2014-02-06 DIAGNOSIS — E119 Type 2 diabetes mellitus without complications: Secondary | ICD-10-CM

## 2014-02-06 DIAGNOSIS — N611 Abscess of the breast and nipple: Secondary | ICD-10-CM

## 2014-02-06 DIAGNOSIS — Z5111 Encounter for antineoplastic chemotherapy: Secondary | ICD-10-CM

## 2014-02-06 DIAGNOSIS — C50919 Malignant neoplasm of unspecified site of unspecified female breast: Secondary | ICD-10-CM

## 2014-02-06 DIAGNOSIS — C50911 Malignant neoplasm of unspecified site of right female breast: Secondary | ICD-10-CM

## 2014-02-06 DIAGNOSIS — R0902 Hypoxemia: Secondary | ICD-10-CM

## 2014-02-06 DIAGNOSIS — T7840XA Allergy, unspecified, initial encounter: Secondary | ICD-10-CM

## 2014-02-06 LAB — COMPREHENSIVE METABOLIC PANEL (CC13)
ALT: 13 U/L (ref 0–55)
ANION GAP: 9 meq/L (ref 3–11)
AST: 19 U/L (ref 5–34)
Albumin: 3.5 g/dL (ref 3.5–5.0)
Alkaline Phosphatase: 84 U/L (ref 40–150)
BUN: 10.3 mg/dL (ref 7.0–26.0)
CO2: 26 meq/L (ref 22–29)
Calcium: 9.2 mg/dL (ref 8.4–10.4)
Chloride: 105 mEq/L (ref 98–109)
Creatinine: 0.7 mg/dL (ref 0.6–1.1)
GLUCOSE: 195 mg/dL — AB (ref 70–140)
Potassium: 4.3 mEq/L (ref 3.5–5.1)
Sodium: 141 mEq/L (ref 136–145)
Total Protein: 6.4 g/dL (ref 6.4–8.3)

## 2014-02-06 LAB — CBC WITH DIFFERENTIAL/PLATELET
BASO%: 2.3 % — ABNORMAL HIGH (ref 0.0–2.0)
Basophils Absolute: 0.1 10*3/uL (ref 0.0–0.1)
EOS ABS: 0.1 10*3/uL (ref 0.0–0.5)
EOS%: 2.6 % (ref 0.0–7.0)
HEMATOCRIT: 31.2 % — AB (ref 34.8–46.6)
HGB: 10.4 g/dL — ABNORMAL LOW (ref 11.6–15.9)
LYMPH%: 32.3 % (ref 14.0–49.7)
MCH: 30.7 pg (ref 25.1–34.0)
MCHC: 33.3 g/dL (ref 31.5–36.0)
MCV: 92.2 fL (ref 79.5–101.0)
MONO#: 2.1 10*3/uL — AB (ref 0.1–0.9)
MONO%: 38.9 % — AB (ref 0.0–14.0)
NEUT#: 1.3 10*3/uL — ABNORMAL LOW (ref 1.5–6.5)
NEUT%: 23.9 % — ABNORMAL LOW (ref 38.4–76.8)
Platelets: 276 10*3/uL (ref 145–400)
RBC: 3.38 10*6/uL — ABNORMAL LOW (ref 3.70–5.45)
RDW: 17.9 % — ABNORMAL HIGH (ref 11.2–14.5)
WBC: 5.5 10*3/uL (ref 3.9–10.3)
lymph#: 1.8 10*3/uL (ref 0.9–3.3)

## 2014-02-06 MED ORDER — DEXAMETHASONE SODIUM PHOSPHATE 20 MG/5ML IJ SOLN
20.0000 mg | Freq: Once | INTRAMUSCULAR | Status: AC
  Administered 2014-02-06: 20 mg via INTRAVENOUS

## 2014-02-06 MED ORDER — DIPHENHYDRAMINE HCL 50 MG/ML IJ SOLN
25.0000 mg | Freq: Once | INTRAMUSCULAR | Status: AC
  Administered 2014-02-06: 11:00:00 via INTRAVENOUS

## 2014-02-06 MED ORDER — MAGIC MOUTHWASH: 5.0000 mL | Freq: Four times a day (QID) | ORAL | Status: DC

## 2014-02-06 MED ORDER — DEXAMETHASONE SODIUM PHOSPHATE 20 MG/5ML IJ SOLN
INTRAMUSCULAR | Status: AC
  Filled 2014-02-06: qty 5

## 2014-02-06 MED ORDER — HEPARIN SOD (PORK) LOCK FLUSH 100 UNIT/ML IV SOLN
500.0000 [IU] | Freq: Once | INTRAVENOUS | Status: AC | PRN
  Administered 2014-02-06: 500 [IU]
  Filled 2014-02-06: qty 5

## 2014-02-06 MED ORDER — SODIUM CHLORIDE 0.9 % IV SOLN
230.0000 mg | Freq: Once | INTRAVENOUS | Status: AC
  Administered 2014-02-06: 230 mg via INTRAVENOUS
  Filled 2014-02-06: qty 23

## 2014-02-06 MED ORDER — FAMOTIDINE IN NACL 20-0.9 MG/50ML-% IV SOLN
INTRAVENOUS | Status: AC
  Filled 2014-02-06: qty 50

## 2014-02-06 MED ORDER — DIPHENHYDRAMINE HCL 50 MG/ML IJ SOLN
25.0000 mg | Freq: Once | INTRAMUSCULAR | Status: AC | PRN
  Administered 2014-02-06: 25 mg via INTRAVENOUS

## 2014-02-06 MED ORDER — PACLITAXEL CHEMO INJECTION 300 MG/50ML
80.0000 mg/m2 | Freq: Once | INTRAVENOUS | Status: AC
  Administered 2014-02-06: 150 mg via INTRAVENOUS
  Filled 2014-02-06: qty 25

## 2014-02-06 MED ORDER — ONDANSETRON 16 MG/50ML IVPB (CHCC)
16.0000 mg | Freq: Once | INTRAVENOUS | Status: AC
  Administered 2014-02-06: 16 mg via INTRAVENOUS

## 2014-02-06 MED ORDER — FAMOTIDINE IN NACL 20-0.9 MG/50ML-% IV SOLN
20.0000 mg | Freq: Once | INTRAVENOUS | Status: AC
  Administered 2014-02-06: 20 mg via INTRAVENOUS

## 2014-02-06 MED ORDER — ONDANSETRON 16 MG/50ML IVPB (CHCC)
INTRAVENOUS | Status: AC
  Filled 2014-02-06: qty 16

## 2014-02-06 MED ORDER — DIPHENHYDRAMINE HCL 50 MG/ML IJ SOLN
INTRAMUSCULAR | Status: AC
  Filled 2014-02-06: qty 1

## 2014-02-06 MED ORDER — FIRST-DUKES MOUTHWASH MT SUSP: OROMUCOSAL | Status: DC

## 2014-02-06 MED ORDER — SODIUM CHLORIDE 0.9 % IV SOLN
Freq: Once | INTRAVENOUS | Status: AC
  Administered 2014-02-06: 10:00:00 via INTRAVENOUS

## 2014-02-06 MED ORDER — SODIUM CHLORIDE 0.9 % IJ SOLN
10.0000 mL | INTRAMUSCULAR | Status: DC | PRN
  Administered 2014-02-06: 10 mL
  Filled 2014-02-06: qty 10

## 2014-02-06 NOTE — Patient Instructions (Addendum)
[facility] Discharge Instructions for Patients Receiving Chemotherapy  Today you received the following chemotherapy agents Taxol,. Carboplatin.  To help prevent nausea and vomiting after your treatment, we encourage you to take your nausea medication. Ativan 0.5mg .  Take 1 tablet at bedtime as needed for nausea. Zofran 8mg  every 8 hours as needed for nausea.   If you develop nausea and vomiting that is not controlled by your nausea medication, call the clinic.   BELOW ARE SYMPTOMS THAT SHOULD BE REPORTED IMMEDIATELY:  *FEVER GREATER THAN 100.5 F  *CHILLS WITH OR WITHOUT FEVER  NAUSEA AND VOMITING THAT IS NOT CONTROLLED WITH YOUR NAUSEA MEDICATION  *UNUSUAL SHORTNESS OF BREATH  *UNUSUAL BRUISING OR BLEEDING  TENDERNESS IN MOUTH AND THROAT WITH OR WITHOUT PRESENCE OF ULCERS  *URINARY PROBLEMS  *BOWEL PROBLEMS  UNUSUAL RASH Items with * indicate a potential emergency and should be followed up as soon as possible.  Feel free to call the clinic you have any questions or concerns. The clinic phone number is [phone].

## 2014-02-06 NOTE — Progress Notes (Signed)
[facility]  Telephone:(336) [phone] [phone]) [phone]     ID: Carmen Stone OB: September 02, 1955  MR#: [id number]  MWN#:[id number]  PCP: Carmen Palau, MD GYN:   SU: Carmen Bookbinder MD    CHIEF COMPLAINT: Triple negative breast cancer, locally advanced CURRENT TREATMENT: Adjuvant chemotherapy. To start Carboplatin and Taxol weekly regimen. Week 2 or cycle 2 today. Last week was delayed due to low white count.  BREAST CANCER HISTORY: As per previously dictated Carmen Stone's note:  Carmen Stone presented to the emergency room on 10/22/2013 complaining of right breast pain and discharge. On exam the breast was diffusely erythematous with" orange peel" appearance. She was admitted for debridement and surgery he describes a skin defect in the lower inner quadrant measuring approximately 4 cm. When the breast was breast, air came out through the hole--there are photos included with the admission note. A chest x-ray was obtained which showed the right breast to be asymmetrically enlarged with extensive subcutaneous gas.  On the same day the patient underwent debridement of the wound, with the pathology (SZA 15-2010) showing high-grade carcinoma involving ulcerated skin. There was tumor within the dermal and subcutaneous lymphatics. The tumor was negative for estrogen, progesterone, and HER-2(signals ratio 1.24, number per cell 3.6), with an MIB-1 of 95%. Gross cystic disease fluid protein was also negative.  The patient was admitted and started on antibiotics. On 10/24/2013 an attempt was made at further debridement, but the inside of the breast essentially came out in hand falls. The pathology from this procedure(SZA 15-20 and 26) again showed high-grade carcinoma.  On 10/25/2013 the patient underwent CT scans of the chest abdomen and pelvis. Aside from disease in the breast and right axilla there was no evidence of metastatic disease.     Accordingly Dr. Donne Stone  proceeded to right mastectomy with axillary lymph node dissection 10/26/2013. Results of this procedure (SZA [id number]) showed a 15 cm defect in the middle of the breast, which is 4 prior necrotic tissue had been scoped out. There was nevertheless extensive invasive ductal carcinoma, grade 3, in the specimen and repeat or blastic panel was again triple negative. 2 of 16 axillary lymph nodes were involved by macrometastatic deposits. The patient was continued on antibiotics and eventually discharged 11/03/2013.   Initial chemotherapy with dose dense doxorubicin and cyclophosphamide x 4 cycles completed 01/09/2014. Now plan is to give carboplatin and paclitaxel weekly x12 doses, started 01/23/2014 (today) and postmastectomy radiation to follow after completion of chemotherapy.  INTERVAL HISTORY: Carmen Stone returns today for followup of her breast cancer on chemotherapy. She has completed chemotherapy with cyclophosphamide and doxorubicin and received the fourth and final cycle. She got 1 cycle of Carboplatin and Taxol. Patient is doing well. She has alopecia complete now from [facility] chemo.She was due for week 2 of carboplatin and taxol last week but her labs indicated that her WBC was 2500 and ANC 1000 so I delayed her week 2 chemo and gave her 2 days of neupogen shot to allow white cell recovery.   Today her labs indicate:ANC 1300 but I will plan chemo followed by 2 days of neupogen. ]       REVIEW OF SYSTEMS: Carmen Stone did remarkably well with the first part of her chemotherapy. She never had nausea or vomiting problems. She was fatigued but now recovered. No chest pain, palpitations, SOB, ankle edema, or GI symptoms. She does not like Aleve, which upsets her stomach, and Tylenol is not sufficient. She does not get constipated from the  tramadol. She did get some mouth sore on left upper jaw and I gave her a script for Mgic mouthwash.   Aside from this a detailed review of systems today was remarkably  benign   PAST MEDICAL HISTORY: Past Medical History  Diagnosis Date  . Diabetes mellitus, type II   . Inflammatory breast cancer     PAST SURGICAL HISTORY: Past Surgical History  Procedure Laterality Date  . Cesarean section    . Ventral hernia repair  2011    repair incacerated VH with biologic mesh; cholecystectomy  . Cholecystectomy  2011  . Incision and drainage of wound Right 10/22/2013    Procedure: IRRIGATION AND DRAINAGE AND DEBRIDEMENT RIGHT BREAST WOUND;  Surgeon: Carmen Curry, MD;  Location: [facility];  Service: General;  Laterality: Right;  . Breast biopsy Right 10/22/2013    Procedure: BREAST BIOPSY;  Surgeon: Carmen Curry, MD;  Location: [facility];  Service: General;  Laterality: Right;  . Irrigation and debridement abscess Right 10/24/2013    Procedure: Right total mastectomy;  Surgeon: Carmen Bookbinder, MD;  Location: [facility];  Service: General;  Laterality: Right;  . Dressing change under anesthesia Right 10/26/2013    Procedure: COMPLETION MASTECTOMY, AXILLARY DISSECTION;  Surgeon: Carmen Bookbinder, MD;  Location: [facility];  Service: General;  Laterality: Right;  . Portacath placement N/A 11/16/2013    Procedure: INSERTION PORT-A-CATH;  Surgeon: Carmen Bookbinder, MD;  Location: [facility];  Service: General;  Laterality: N/A;    FAMILY HISTORY No family history on file. The patient's father was a heavy smoker and died from complications of emphysema at the age of 53. The patient's mother died from lung cancer at the age of 57. She also was a heavy smoker. The patient had one brother, no sisters. There is no history of breast or ovarian cancer in the family.  GYNECOLOGIC HISTORY:  Menarche age 41, first live birth age 32, the patient is GX P1. She went through the change of life approximately age 34. She did not take hormone replacement.  SOCIAL HISTORY:  Carmen Stone is divorced. She works for [employer] and also works a second job at [employer] on [employer]. She  plans to be on disability during her chemoradiation treatments. She lives alone, but her daughter Carmen Stone frequently stays there overnight. 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 She spends her time between helping her mom and helping her grandfather, Carmen Stone, who lives in [place] and has significant dementia. Carmen Stone was present at the 11/18/2013 visit only because Carmen Stone wanted to be present and otherwise no one would have been with Carmen Stone. Carmen Stone has no grandchildren. She attends a Nurse, adult church   ADVANCED DIRECTIVES: In place. The patient has named her brother Carmen Stone as her healthcare power of attorney. Carmen Stone can be reached at [phone]. He is a Pharmacist, hospital locally  HEALTH MAINTENANCE: History  Substance Use Topics  . Smoking status: Never Smoker   . Smokeless tobacco: Not on file  . Alcohol Use: No     Colonoscopy:  PAP:  Bone density:  Lipid panel:  No Known Allergies  Current Outpatient Prescriptions  Medication Sig Dispense Refill  . acetaminophen (TYLENOL) 325 MG tablet Take 650 mg by mouth every 6 (six) hours as needed for mild pain.      Marland Kitchen dexamethasone (DECADRON) 4 MG tablet Take 2 tablets by mouth once a day on the day after chemotherapy and then take  2 tablets two times a day for 2 days. Take with food.  30 tablet  1  . glucose blood test strip Use as instructed  100 each  12  . glucose monitoring kit (FREESTYLE) monitoring kit 1 each by Does not apply route as needed for other. Check blood sugars daily.  1 each  0  . Insulin Glargine (LANTUS SOLOSTAR) 100 UNIT/ML Solostar Pen Inject 10 Units into the skin daily at 10 pm.  15 mL  3  . linagliptin (TRADJENTA) 5 MG TABS tablet Take 1 tablet (5 mg total) by mouth daily.  30 tablet  0  . LORazepam (ATIVAN) 0.5 MG tablet Take 1 tablet (0.5 mg total) by mouth at bedtime as needed (Nausea or vomiting).  30 tablet  0  . ondansetron (ZOFRAN) 8 MG tablet Take  1 tablet (8 mg total) by mouth every 8 (eight) hours as needed for nausea or vomiting.  20 tablet  3  . pantoprazole (PROTONIX) 40 MG tablet Take 1 tablet (40 mg total) by mouth daily at 6 (six) AM.  30 tablet  3  . traMADol (ULTRAM) 50 MG tablet Take 1 tablet (50 mg total) by mouth every 6 (six) hours as needed.  30 tablet  0   No current facility-administered medications for this visit.    OBJECTIVE: Middle-aged [place] American woman who appears stated age 58 Vitals:   02/06/14 0927  BP: 135/78  Pulse: 97  Temp: 98.6 F (37 C)  Resp: 18     Body mass index is 29.18 kg/(m^2).    ECOG FS:1 - Symptomatic but completely ambulatory  Sclerae unicteric, pupils equal and reactive Oropharynx clear and moist, no thrush or mucositis No cervical or supraclavicular adenopathy Lungs no rales or rhonchi Heart regular rate and rhythm Abd soft, nontender, positive bowel sounds MSK no focal spinal tenderness, no upper extremity lymphedema Neuro: nonfocal, well oriented, appropriate affect Breasts: Deferred   RADIOLOGY DATA   CT chest, abdomen and pelvis 10/26/2013:   1. Postoperative changes of recent right breast surgery with packed open wound which has a thick rind of soft tissue, some of which  enhances, a 1.3 x 1.5 cm enhancing lesion in the medial aspect of the right breast which may represent residual tumor or a malignant  lymph node, and multiple enlarged right axillary nodes concerning for axillary nodal metastases.  2. No other definite signs of metastatic disease in the chest, abdomen or pelvis.  3. Multiple small ventral hernias containing several loops of small bowel, as well as the mid transverse colon, without associated bowel  incarceration or obstruction at this time.  4. Trace bilateral pleural effusions with mild dependent subsegmental atelectasis in the lower lobes of the lungs bilaterally.  5. Status post cholecystectomy and hysterectomy.        ASSESSMENT/PLAN:  59 y.o. Carmen Stone woman  #1. status post right modified radical mastectomy 10/26/2013 for a cT4, pT3 pN1, stage IIIB invasive ductal carcinoma, grade 3, triple negative, with an MIB-1 of 95% . #2. Initial chemotherapy with dose dense doxorubicin and cyclophosphamide x 4 cycles completed 01/09/2014  #3. carboplatin and paclitaxel weekly x12 doses, to started on 01/23/2014. LAST WEEK HER CHEMO WAS DELAYED and she got 2 dose of neupogen. I will also recommend adding neupogen 2 days after each week of her remaining chemotherapy.  I did a physical exam and checked her labs. She has mild stable anemia related to chemotherapy. Today she will get chemo but followed by 2 days  of neupogen.  #4  postmastectomy radiation to follow after completion of chemotherapy.  #5 We will continue to monitor her labs weekly and do a toxicity screening and a visit with a provider. We called [employer] Mouthwash to her pharmacy and gave her instructions on mouth care.  #6 RTC next week for week 3 of carboplatin and taxol.    Bernadene Bell, MD Medical Hematologist/Oncologist [facility] Pager: [phone] Office No: [phone] 02/06/2014 10:01 AM

## 2014-02-06 NOTE — Progress Notes (Signed)
1035-OK to treat with ANC-1.3.  Pt to receive Neupogen on d-#2 and d-#3 per Dr. Lona Kettle.  1120-Pt complaining of SOB and feeling "hot."  Taxol stopped. UJ-81-191/47, O2 sat-86% on RA.  O2 applied at 2L [employer] and C. Berniece Salines NP notified of patients condition.    1125-C. Berniece Salines NP to infusion room to assess patient.  Order received to give patient additional Benadryl 25 mg IV now.  O2 sat up to 96% on 2L [place].  Pt now denies SOB and states that flushed feeling has gone away.  1130-VS stable.  Pt with no further complaints.  1155-Taxol restarted per order of C. Berniece Salines NP.  Pt without complaints at this time.  1325-Taxol complete.  Pt with no further complaints.

## 2014-02-07 ENCOUNTER — Other Ambulatory Visit: Payer: Self-pay | Admitting: Hematology

## 2014-02-07 ENCOUNTER — Ambulatory Visit (HOSPITAL_BASED_OUTPATIENT_CLINIC_OR_DEPARTMENT_OTHER): Payer: BC Managed Care – PPO

## 2014-02-07 VITALS — BP 132/67 | HR 82 | Temp 98.4°F

## 2014-02-07 DIAGNOSIS — Z5189 Encounter for other specified aftercare: Secondary | ICD-10-CM

## 2014-02-07 DIAGNOSIS — C50919 Malignant neoplasm of unspecified site of unspecified female breast: Secondary | ICD-10-CM

## 2014-02-07 MED ORDER — TBO-FILGRASTIM 300 MCG/0.5ML ~~LOC~~ SOSY
300.0000 ug | PREFILLED_SYRINGE | Freq: Once | SUBCUTANEOUS | Status: AC
  Administered 2014-02-07: 300 ug via SUBCUTANEOUS
  Filled 2014-02-07: qty 0.5

## 2014-02-08 ENCOUNTER — Ambulatory Visit (HOSPITAL_BASED_OUTPATIENT_CLINIC_OR_DEPARTMENT_OTHER): Payer: BC Managed Care – PPO

## 2014-02-08 ENCOUNTER — Telehealth: Payer: Self-pay | Admitting: Oncology

## 2014-02-08 ENCOUNTER — Telehealth: Payer: Self-pay | Admitting: *Deleted

## 2014-02-08 ENCOUNTER — Encounter: Payer: Self-pay | Admitting: Nurse Practitioner

## 2014-02-08 VITALS — BP 142/66 | HR 93 | Temp 98.1°F

## 2014-02-08 DIAGNOSIS — C50919 Malignant neoplasm of unspecified site of unspecified female breast: Secondary | ICD-10-CM

## 2014-02-08 DIAGNOSIS — Z5189 Encounter for other specified aftercare: Secondary | ICD-10-CM

## 2014-02-08 DIAGNOSIS — T7840XA Allergy, unspecified, initial encounter: Secondary | ICD-10-CM | POA: Insufficient documentation

## 2014-02-08 MED ORDER — TBO-FILGRASTIM 300 MCG/0.5ML ~~LOC~~ SOSY
300.0000 ug | PREFILLED_SYRINGE | Freq: Once | SUBCUTANEOUS | Status: AC
  Administered 2014-02-08: 300 ug via SUBCUTANEOUS
  Filled 2014-02-08: qty 0.5

## 2014-02-08 NOTE — Telephone Encounter (Signed)
Per staff message and POF I have scheduled appts. Advised scheduler of appts. [name]  

## 2014-02-08 NOTE — Progress Notes (Signed)
[place]   Chief Complaint  Patient presents with  . Hypersensitivity Reaction    HPI: Carmen Stone 58 y.o. female diagnosed with breast cancer.  Currently undergoing weekly carboplatin/Taxol.  Patient was in the midst of receiving the Taxol only portion of her chemotherapy today; and developed a mild hypersensitivity reaction which consisted of shortness of breath and decreased O2 sats from 86-88% on room air.  Patient continued to deny any chest pressure or chest pain.  She also denies any pain with inspiration.  Patient was placed on 2 L nasal cannula O2; with O2 sats recovering the upper 90s.  Taxol was held during this episode.  Benadryl 25 mg IV was given in addition to the previous premedications.  All shortness of breath but did completely subside; and O2 was removed from patient.  I patient was able to maintain her O2 sats in the upper 90s on room air.  A Taxol infusion was completed with no further difficulties whatsoever.   Hypersensitivity Reaction    CURRENT THERAPY: Upcoming Treatment Dates - BREAST Paclitaxel / Carboplatin q7d Days with orders from any treatment category:  02/13/2014      SCHEDULING COMMUNICATION      diphenhydrAMINE (BENADRYL) injection 25 mg      Dexamethasone Sodium Phosphate (DECADRON) injection 20 mg      ondansetron (ZOFRAN) IVPB 16 mg      famotidine (PEPCID) IVPB 20 mg      PACLitaxel (TAXOL) 150 mg in dextrose 5 % 250 mL chemo infusion (</= 12m/m2)      CARBOplatin (PARAPLATIN) 229.8 mg in sodium chloride 0.9 % 100 mL chemo infusion      sodium chloride 0.9 % injection 10 mL      heparin lock flush 100 unit/mL      heparin lock flush 100 unit/mL      alteplase (CATHFLO ACTIVASE) injection 2 mg      sodium chloride 0.9 % injection 3 mL      Cold Pack 1 packet      diphenhydrAMINE (BENADRYL) injection 25 mg      famotidine (PEPCID) IVPB 20 mg      0.9 %  sodium chloride infusion      methylPREDNISolone sodium  succinate (SOLU-MEDROL) 125 mg/2 mL injection 125 mg      EPINEPHrine (ADRENALIN) 0.1 MG/ML injection 0.25 mg      EPINEPHrine (ADRENALIN) 0.1 MG/ML injection 0.25 mg      EPINEPHrine (ADRENALIN) injection 0.5 mg      EPINEPHrine (ADRENALIN) injection 0.5 mg      diphenhydrAMINE (BENADRYL) injection 25 mg      albuterol (PROVENTIL) (2.5 MG/3ML) 0.083% nebulizer solution 2.5 mg      0.9 %  sodium chloride infusion      TREATMENT CONDITIONS    ROS  Past Medical History  Diagnosis Date  . Diabetes mellitus, type II   . Inflammatory breast cancer     Past Surgical History  Procedure Laterality Date  . Cesarean section    . Ventral hernia repair  2011    repair incacerated VH with biologic mesh; cholecystectomy  . Cholecystectomy  2011  . Incision and drainage of wound Right 10/22/2013    Procedure: IRRIGATION AND DRAINAGE AND DEBRIDEMENT RIGHT BREAST WOUND;  Surgeon: EGayland Curry MD;  [place] [facility]  Service: General;  Laterality: Right;  . Breast biopsy Right 10/22/2013    Procedure: BREAST BIOPSY;  Surgeon: EGayland Curry MD;  Location: [place];  Service: General;  Laterality: Right;  . Irrigation and debridement abscess Right 10/24/2013    Procedure: Right total mastectomy;  Surgeon: [name], MD;  Location: [place];  Service: General;  Laterality: Right;  . Dressing change under anesthesia Right 10/26/2013    Procedure: COMPLETION MASTECTOMY, AXILLARY DISSECTION;  Surgeon: [name], MD;  Location: [place];  Service: General;  Laterality: Right;  . Portacath placement N/A 11/16/2013    Procedure: INSERTION PORT-A-CATH;  Surgeon: [name], MD;  Location: [place];  Service: General;  Laterality: N/A;    has DIABETES MELLITUS, TYPE II; CHOLELITHIASIS; HYPOXEMIA; SMALL BOWEL OBSTRUCTION, HX OF; HYPERLIPIDEMIA; ATELECTASIS; Breast abscess of female; Postoperative anemia due to acute blood loss; Acute respiratory failure with hypoxia; Abnormal  urinalysis; Inflammatory breast cancer; ATN (acute tubular necrosis); Acute respiratory failure; Nausea with vomiting; Drug induced neutropenia(288.03); and Hypersensitivity reaction on her problem list.     has No Known Allergies.    Medication List       This list is accurate as of: 02/06/14 11:59 PM.  Always use your most recent med list.               acetaminophen 325 MG tablet  Commonly known as:  TYLENOL  Take 650 mg by mouth every 6 (six) hours as needed for mild pain.     dexamethasone 4 MG tablet  Commonly known as:  DECADRON  Take 2 tablets by mouth once a day on the day after chemotherapy and then take 2 tablets two times a day for 2 days. Take with food.     FIRST-DUKES MOUTHWASH Susp  Swish and spit 5 mls by mouth every 6 hours for mouth sores and use before each meal.     glucose blood test strip  Use as instructed     glucose monitoring kit monitoring kit  1 each by Does not apply route as needed for other. Check blood sugars daily.     Insulin Glargine 100 UNIT/ML Solostar Pen  Commonly known as:  LANTUS SOLOSTAR  Inject 10 Units into the skin daily at 10 pm.     linagliptin 5 MG Tabs tablet  Commonly known as:  TRADJENTA  Take 1 tablet (5 mg total) by mouth daily.     LORazepam 0.5 MG tablet  Commonly known as:  ATIVAN  Take 1 tablet (0.5 mg total) by mouth at bedtime as needed (Nausea or vomiting).     ondansetron 8 MG tablet  Commonly known as:  ZOFRAN  Take 1 tablet (8 mg total) by mouth every 8 (eight) hours as needed for nausea or vomiting.     pantoprazole 40 MG tablet  Commonly known as:  PROTONIX  Take 1 tablet (40 mg total) by mouth daily at 6 (six) AM.     traMADol 50 MG tablet  Commonly known as:  ULTRAM  Take 1 tablet (50 mg total) by mouth every 6 (six) hours as needed.         PHYSICAL EXAMINATION  Vitals:  BP 153/78, HR 81, O2 sats 100% on room air prior to discharge.    Physical Exam  Nursing note and vitals  reviewed. Constitutional: She is oriented to person, place, and time and well-developed, well-nourished, and in no distress.  HENT:  Head: Normocephalic.  Eyes: Conjunctivae are normal. Pupils are equal, round, and reactive to light.  Neck: Normal range of motion.  Cardiovascular: Normal rate, regular rhythm and normal heart sounds.  Pulmonary/Chest: Effort normal. No respiratory distress. She has no wheezes. She has no rales.  Abdominal: Soft. Bowel sounds are normal.  Musculoskeletal: Normal range of motion.  Neurological: She is alert and oriented to person, place, and time.  Skin: Skin is warm and dry.  Psychiatric: Affect normal.    LABORATORY DATA:. CBC  Lab Results  Component Value Date   WBC 5.5 02/06/2014   RBC 3.38* 02/06/2014   HGB 10.4* 02/06/2014   HCT 31.2* 02/06/2014   PLT 276 02/06/2014   MCV 92.2 02/06/2014   MCH 30.7 02/06/2014   MCHC 33.3 02/06/2014   RDW 17.9* 02/06/2014   LYMPHSABS 1.8 02/06/2014   MONOABS 2.1* 02/06/2014   EOSABS 0.1 02/06/2014   BASOSABS 0.1 02/06/2014     CMET  Lab Results  Component Value Date   NA 141 02/06/2014   K 4.3 02/06/2014   CL 103 01/23/2014   CO2 26 02/06/2014   GLUCOSE 195* 02/06/2014   BUN 10.3 02/06/2014   CREATININE 0.7 02/06/2014   CALCIUM 9.2 02/06/2014   PROT 6.4 02/06/2014   ALBUMIN 3.5 02/06/2014   AST 19 02/06/2014   ALT 13 02/06/2014   ALKPHOS 84 02/06/2014   BILITOT <0.20 02/06/2014   GFRNONAA 37* 11/07/2013   GFRAA 43* 11/07/2013    ASSESSMENT/PLAN:    Inflammatory breast cancer  Assessment & Plan Patient currently undergoing carboplatin/Taxol chemotherapy regimen weekly.  Patient will receive cycle 2 of his chemotherapy regimen today.  The patient will receive Neupogen for the next 2 days for growth factor support.  Patient has plans to return next week for the same chemotherapy regimen.   Hypersensitivity reaction  Assessment & Plan Patient was in the midst of receiving cycle 2 of the Taxol only portion of her  carboplatin/Taxol chemotherapy regimen.  Patient developed mild shortness of breath; with an O2 saturation of 86-88% on room air.  Patient was placed on 2 L nasal cannula O2; with O2 sats improving to 96-98%.  The Taxol was held during this episode-inpatient fully recovered.  She currently denies any shortness of breath, chest pressure, or chest pain.  Patient was once again placed on room air only a; and O2 sats remained stable in the upper 90s.  Taxol was resumed; with no further difficulties whatsoever.   Patient stated understanding of all instructions; and was in agreement with this plan of care. The patient knows to call the clinic with any problems, questions or concerns.   Review/collaboration with Dr.Sehbai regarding all aspects of patient's visit today.   Total time spent with patient was 15  minutes;  with greater than  9%  percent of that time spent in face to face counseling regarding  her symptoms, continual monitoring,  and coordination of care and follow up.  Disclaimer: This note was dictated with voice recognition software. Similar sounding words can inadvertently be transcribed and may not be corrected upon review.   Drue Second, NP 02/08/2014

## 2014-02-08 NOTE — Assessment & Plan Note (Signed)
Patient currently undergoing carboplatin/Taxol chemotherapy regimen weekly.  Patient will receive cycle 2 of his chemotherapy regimen today.  The patient will receive Neupogen for the next 2 days for growth factor support.  Patient has plans to return next week for the same chemotherapy regimen.

## 2014-02-08 NOTE — Assessment & Plan Note (Signed)
Patient was in the midst of receiving cycle 2 of the Taxol only portion of her carboplatin/Taxol chemotherapy regimen.  Patient developed mild shortness of breath; with an O2 saturation of 86-88% on room air.  Patient was placed on 2 L nasal cannula O2; with O2 sats improving to 96-98%.  The Taxol was held during this episode-inpatient fully recovered.  She currently denies any shortness of breath, chest pressure, or chest pain.  Patient was once again placed on room air only a; and O2 sats remained stable in the upper 90s.  Taxol was resumed; with no further difficulties whatsoever.

## 2014-02-08 NOTE — Telephone Encounter (Signed)
, °

## 2014-02-13 ENCOUNTER — Ambulatory Visit (HOSPITAL_BASED_OUTPATIENT_CLINIC_OR_DEPARTMENT_OTHER): Payer: BC Managed Care – PPO

## 2014-02-13 ENCOUNTER — Other Ambulatory Visit: Payer: Self-pay | Admitting: Oncology

## 2014-02-13 ENCOUNTER — Other Ambulatory Visit (HOSPITAL_BASED_OUTPATIENT_CLINIC_OR_DEPARTMENT_OTHER): Payer: BC Managed Care – PPO

## 2014-02-13 ENCOUNTER — Ambulatory Visit (HOSPITAL_BASED_OUTPATIENT_CLINIC_OR_DEPARTMENT_OTHER): Payer: BC Managed Care – PPO | Admitting: Hematology

## 2014-02-13 ENCOUNTER — Telehealth: Payer: Self-pay | Admitting: Hematology

## 2014-02-13 VITALS — BP 131/64 | HR 93 | Temp 98.4°F | Resp 18 | Ht 64.0 in | Wt 163.5 lb

## 2014-02-13 DIAGNOSIS — C773 Secondary and unspecified malignant neoplasm of axilla and upper limb lymph nodes: Secondary | ICD-10-CM

## 2014-02-13 DIAGNOSIS — C50919 Malignant neoplasm of unspecified site of unspecified female breast: Secondary | ICD-10-CM

## 2014-02-13 DIAGNOSIS — N611 Abscess of the breast and nipple: Secondary | ICD-10-CM

## 2014-02-13 DIAGNOSIS — Z5111 Encounter for antineoplastic chemotherapy: Secondary | ICD-10-CM

## 2014-02-13 DIAGNOSIS — E119 Type 2 diabetes mellitus without complications: Secondary | ICD-10-CM

## 2014-02-13 DIAGNOSIS — Z171 Estrogen receptor negative status [ER-]: Secondary | ICD-10-CM

## 2014-02-13 DIAGNOSIS — C50A0 Malignant inflammatory neoplasm of unspecified breast: Secondary | ICD-10-CM

## 2014-02-13 DIAGNOSIS — C50911 Malignant neoplasm of unspecified site of right female breast: Secondary | ICD-10-CM

## 2014-02-13 DIAGNOSIS — R0902 Hypoxemia: Secondary | ICD-10-CM

## 2014-02-13 LAB — CBC WITH DIFFERENTIAL/PLATELET
BASO%: 1.4 % (ref 0.0–2.0)
BASOS ABS: 0.1 10*3/uL (ref 0.0–0.1)
EOS%: 2.4 % (ref 0.0–7.0)
Eosinophils Absolute: 0.2 10*3/uL (ref 0.0–0.5)
HEMATOCRIT: 31.8 % — AB (ref 34.8–46.6)
HEMOGLOBIN: 10.6 g/dL — AB (ref 11.6–15.9)
LYMPH#: 1.2 10*3/uL (ref 0.9–3.3)
LYMPH%: 16 % (ref 14.0–49.7)
MCH: 30.4 pg (ref 25.1–34.0)
MCHC: 33.3 g/dL (ref 31.5–36.0)
MCV: 91.1 fL (ref 79.5–101.0)
MONO#: 0.6 10*3/uL (ref 0.1–0.9)
MONO%: 7.5 % (ref 0.0–14.0)
NEUT%: 72.7 % (ref 38.4–76.8)
NEUTROS ABS: 5.4 10*3/uL (ref 1.5–6.5)
Platelets: 262 10*3/uL (ref 145–400)
RBC: 3.49 10*6/uL — ABNORMAL LOW (ref 3.70–5.45)
RDW: 15.4 % — AB (ref 11.2–14.5)
WBC: 7.4 10*3/uL (ref 3.9–10.3)

## 2014-02-13 LAB — COMPREHENSIVE METABOLIC PANEL (CC13)
ALT: 15 U/L (ref 0–55)
ANION GAP: 7 meq/L (ref 3–11)
AST: 13 U/L (ref 5–34)
Albumin: 3.7 g/dL (ref 3.5–5.0)
Alkaline Phosphatase: 93 U/L (ref 40–150)
BUN: 15.7 mg/dL (ref 7.0–26.0)
CALCIUM: 9.2 mg/dL (ref 8.4–10.4)
CHLORIDE: 107 meq/L (ref 98–109)
CO2: 27 meq/L (ref 22–29)
Creatinine: 0.7 mg/dL (ref 0.6–1.1)
Glucose: 159 mg/dl — ABNORMAL HIGH (ref 70–140)
Potassium: 4.3 mEq/L (ref 3.5–5.1)
Sodium: 140 mEq/L (ref 136–145)
Total Bilirubin: 0.33 mg/dL (ref 0.20–1.20)
Total Protein: 6.7 g/dL (ref 6.4–8.3)

## 2014-02-13 MED ORDER — PACLITAXEL CHEMO INJECTION 300 MG/50ML
80.0000 mg/m2 | Freq: Once | INTRAVENOUS | Status: AC
  Administered 2014-02-13: 150 mg via INTRAVENOUS
  Filled 2014-02-13: qty 25

## 2014-02-13 MED ORDER — SODIUM CHLORIDE 0.9 % IV SOLN
Freq: Once | INTRAVENOUS | Status: AC
  Administered 2014-02-13: 13:00:00 via INTRAVENOUS

## 2014-02-13 MED ORDER — SODIUM CHLORIDE 0.9 % IJ SOLN
10.0000 mL | INTRAMUSCULAR | Status: DC | PRN
  Administered 2014-02-13: 10 mL
  Filled 2014-02-13: qty 10

## 2014-02-13 MED ORDER — DEXAMETHASONE SODIUM PHOSPHATE 20 MG/5ML IJ SOLN
20.0000 mg | Freq: Once | INTRAMUSCULAR | Status: AC
  Administered 2014-02-13: 20 mg via INTRAVENOUS

## 2014-02-13 MED ORDER — FAMOTIDINE IN NACL 20-0.9 MG/50ML-% IV SOLN
INTRAVENOUS | Status: AC
  Filled 2014-02-13: qty 50

## 2014-02-13 MED ORDER — ONDANSETRON 16 MG/50ML IVPB (CHCC)
INTRAVENOUS | Status: AC
  Filled 2014-02-13: qty 16

## 2014-02-13 MED ORDER — TRAMADOL HCL 50 MG PO TABS: 50.0000 mg | ORAL_TABLET | Freq: Four times a day (QID) | ORAL | Status: DC | PRN

## 2014-02-13 MED ORDER — HEPARIN SOD (PORK) LOCK FLUSH 100 UNIT/ML IV SOLN
500.0000 [IU] | Freq: Once | INTRAVENOUS | Status: AC | PRN
  Administered 2014-02-13: 500 [IU]
  Filled 2014-02-13: qty 5

## 2014-02-13 MED ORDER — ONDANSETRON 16 MG/50ML IVPB (CHCC)
16.0000 mg | Freq: Once | INTRAVENOUS | Status: AC
  Administered 2014-02-13: 16 mg via INTRAVENOUS

## 2014-02-13 MED ORDER — SODIUM CHLORIDE 0.9 % IV SOLN
229.0000 mg | Freq: Once | INTRAVENOUS | Status: AC
  Administered 2014-02-13: 230 mg via INTRAVENOUS
  Filled 2014-02-13: qty 23

## 2014-02-13 MED ORDER — DIPHENHYDRAMINE HCL 50 MG/ML IJ SOLN
25.0000 mg | Freq: Once | INTRAMUSCULAR | Status: AC
  Administered 2014-02-13: 25 mg via INTRAVENOUS

## 2014-02-13 MED ORDER — DEXAMETHASONE SODIUM PHOSPHATE 20 MG/5ML IJ SOLN
INTRAMUSCULAR | Status: AC
  Filled 2014-02-13: qty 5

## 2014-02-13 MED ORDER — FAMOTIDINE IN NACL 20-0.9 MG/50ML-% IV SOLN
20.0000 mg | Freq: Once | INTRAVENOUS | Status: AC
  Administered 2014-02-13: 20 mg via INTRAVENOUS

## 2014-02-13 MED ORDER — DIPHENHYDRAMINE HCL 50 MG/ML IJ SOLN
INTRAMUSCULAR | Status: AC
  Filled 2014-02-13: qty 1

## 2014-02-13 NOTE — Patient Instructions (Signed)
[facility] Discharge Instructions for Patients Receiving Chemotherapy  Today you received the following chemotherapy agents taxol/carboplatin  To help prevent nausea and vomiting after your treatment, we encourage you to take your nausea medication as directed   If you develop nausea and vomiting that is not controlled by your nausea medication, call the clinic.   BELOW ARE SYMPTOMS THAT SHOULD BE REPORTED IMMEDIATELY:  *FEVER GREATER THAN 100.5 F  *CHILLS WITH OR WITHOUT FEVER  NAUSEA AND VOMITING THAT IS NOT CONTROLLED WITH YOUR NAUSEA MEDICATION  *UNUSUAL SHORTNESS OF BREATH  *UNUSUAL BRUISING OR BLEEDING  TENDERNESS IN MOUTH AND THROAT WITH OR WITHOUT PRESENCE OF ULCERS  *URINARY PROBLEMS  *BOWEL PROBLEMS  UNUSUAL RASH Items with * indicate a potential emergency and should be followed up as soon as possible.  Feel free to call the clinic you have any questions or concerns. The clinic phone number is [phone].  

## 2014-02-13 NOTE — Telephone Encounter (Signed)
s.w Dr. Arlys John he directed that pt needs nepugen inj 9.1. and 9.2

## 2014-02-13 NOTE — Progress Notes (Signed)
[place]  [phone] [phone]     ID: [name] OB: February 21, 1956  MR#: [id number]  IDC#:[id number]  PCP: [name], MD SU: [name] MD  CHIEF COMPLAINT: Triple negative breast cancer, locally advanced   CURRENT TREATMENT: Adjuvant chemotherapy. To start Carboplatin and Taxol weekly regimen. Week 3 or cycle 3 today. Patient reacted to taxol last week but was able to complete it.  BREAST CANCER HISTORY: As per previously dictated Dr.Magrinat's note:  Ms. Privett presented to the emergency room on 10/22/2013 complaining of right breast pain and discharge. On exam the breast was diffusely erythematous with" orange peel" appearance. She was admitted for debridement and surgery he describes a skin defect in the lower inner quadrant measuring approximately 4 cm. When the breast was breast, air came out through the hole--there are photos included with the admission note. A chest x-ray was obtained which showed the right breast to be asymmetrically enlarged with extensive subcutaneous gas.  On the same day the patient underwent debridement of the wound, with the pathology (SZA [id number]) showing high-grade carcinoma involving ulcerated skin. There was tumor within the dermal and subcutaneous lymphatics. The tumor was negative for estrogen, progesterone, and HER-2(signals ratio 1.24, number per cell 3.6), with an MIB-1 of 95%. Gross cystic disease fluid protein was also negative.  The patient was admitted and started on antibiotics. On 10/24/2013 an attempt was made at further debridement, but the inside of the breast essentially came out in hand falls. The pathology from this procedure(SZA 15-20 and 26) again showed high-grade carcinoma.  On 10/25/2013 the patient underwent CT scans of the chest abdomen and pelvis. Aside from disease in the breast and right axilla there was no evidence of metastatic disease.     Accordingly Dr.  [name] proceeded to right mastectomy with axillary lymph node dissection 10/26/2013. Results of this procedure ([id number]) showed a 15 cm defect in the middle of the breast, which is 4 prior necrotic tissue had been scoped out. There was nevertheless extensive invasive ductal carcinoma, grade 3, in the specimen and repeat or blastic panel was again triple negative. 2 of 16 axillary lymph nodes were involved by macrometastatic deposits. The patient was continued on antibiotics and eventually discharged 11/03/2013.   Initial chemotherapy with dose dense doxorubicin and cyclophosphamide x 4 cycles completed 01/09/2014. Now plan is to give carboplatin and paclitaxel weekly x12 doses, today is week 3, and postmastectomy radiation to follow after completion of chemotherapy.  INTERVAL HISTORY:  Taziah returns today for followup of her breast cancer on chemotherapy. She has completed chemotherapy with cyclophosphamide and doxorubicin and received the fourth and final cycle. She got 2 cycles of Carboplatin and Taxol. Patient is doing well. She has alopecia complete now from [facility] chemo. She now gets neupogen shots for 2 days after each week of carboplatin and taxol. Pt complaining of SOB and feeling "hot." Taxol stopped. IL-57-972/82, O2 sat-86% on RA. O2 applied at 2L [place] and C. Berniece Salines NP notified of patients condition. Berniece Salines NP came to infusion room and assessed patient. Order received to give patient additional Benadryl 25 mg IV now. O2 sat up to 96% on 2L . Pt now denies SOB and states that flushed feeling has gone away. VS stable. Pt with no further complaints. Taxol restarted per order of C. Berniece Salines NP. Pt without complaints at this time.  Taxol completed. Today she also got her taxol and we watched her carefully and she did not develop any reaction  and got the premedication with dexamethasone as per protocol.   REVIEW OF SYSTEMS: Carolann did remarkably well with the first part of her chemotherapy. She  never had nausea or vomiting problems. She was fatigued but now recovered. No chest pain, palpitations, SOB, ankle edema, or GI symptoms. She does not like Aleve, which upsets her stomach, and Tylenol is not sufficient. She does not get constipated from the tramadol and she got a script today for it 30 pills. Aside from this a detailed review of systems today was remarkably benign   PAST MEDICAL HISTORY: Past Medical History  Diagnosis Date  . Diabetes mellitus, type II   . Inflammatory breast cancer     PAST SURGICAL HISTORY: Past Surgical History  Procedure Laterality Date  . Cesarean section    . Ventral hernia repair  2011    repair incacerated VH with biologic mesh; cholecystectomy  . Cholecystectomy  2011  . Incision and drainage of wound Right 10/22/2013    Procedure: IRRIGATION AND DRAINAGE AND DEBRIDEMENT RIGHT BREAST WOUND;  Surgeon: [name], MD;  Location: [place];  Service: General;  Laterality: Right;  . Breast biopsy Right 10/22/2013    Procedure: BREAST BIOPSY;  Surgeon: [name], MD;  Location: [place];  Service: General;  Laterality: Right;  . Irrigation and debridement abscess Right 10/24/2013    Procedure: Right total mastectomy;  Surgeon: [name], MD;  Location: [place];  Service: General;  Laterality: Right;  . Dressing change under anesthesia Right 10/26/2013    Procedure: COMPLETION MASTECTOMY, AXILLARY DISSECTION;  Surgeon: [name], MD;  Location: [place];  Service: General;  Laterality: Right;  . Portacath placement N/A 11/16/2013    Procedure: INSERTION PORT-A-CATH;  Surgeon: [name], MD;  Location: [place];  Service: General;  Laterality: N/A;    FAMILY HISTORY History reviewed. No pertinent family history. The patient's father was a heavy smoker and died from complications of emphysema at the age of 93. The patient's mother died from lung cancer at the age of 59. She also was a heavy smoker. The patient had one  brother, no sisters. There is no history of breast or ovarian cancer in the family.  GYNECOLOGIC HISTORY:  Menarche age 47, first live birth age 77, the patient is GX P1. She went through the change of life approximately age 26. She did not take hormone replacement.  SOCIAL HISTORY:  Hidaya is divorced. She works for [employer] and also works a second job at [employer] on [employer]. She plans to be on disability during her chemoradiation treatments. She lives alone, but her daughter [name] frequently stays there overnight. [name] was [employer]. 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 She spends her time between helping her mom and helping her grandfather, Bhavika Schnider, who lives in [place] and has significant dementia. Mr. Brunet was present at the 11/18/2013 visit only because [name] wanted to be present and otherwise no one would have been with Mr. Draughn. Siera has no grandchildren. She attends a [employer], adult church   ADVANCED DIRECTIVES: In place. The patient has named her brother Clide Cliff as her healthcare power of attorney. Iona Beard can be reached at [phone]. He is a Pharmacist, hospital locally  HEALTH MAINTENANCE: History  Substance Use Topics  . Smoking status: Never Smoker   . Smokeless tobacco: Not on file  . Alcohol Use: No     Colonoscopy:  PAP:  Bone density:  Lipid panel:  No  Known Allergies  Current Outpatient Prescriptions  Medication Sig Dispense Refill  . acetaminophen (TYLENOL) 325 MG tablet Take 650 mg by mouth every 6 (six) hours as needed for mild pain.      Marland Kitchen dexamethasone (DECADRON) 4 MG tablet Take 2 tablets by mouth once a day on the day after chemotherapy and then take 2 tablets two times a day for 2 days. Take with food.  30 tablet  1  . Diphenhyd-Hydrocort-Nystatin (FIRST-DUKES MOUTHWASH) SUSP Swish and spit 5 mls by mouth every 6 hours for mouth sores and use before each meal.  120 mL  1  . glucose blood test strip Use as  instructed  100 each  12  . glucose monitoring kit (FREESTYLE) monitoring kit 1 each by Does not apply route as needed for other. Check blood sugars daily.  1 each  0  . Insulin Glargine (LANTUS SOLOSTAR) 100 UNIT/ML Solostar Pen Inject 10 Units into the skin daily at 10 pm.  15 mL  3  . linagliptin (TRADJENTA) 5 MG TABS tablet Take 1 tablet (5 mg total) by mouth daily.  30 tablet  0  . LORazepam (ATIVAN) 0.5 MG tablet Take 1 tablet (0.5 mg total) by mouth at bedtime as needed (Nausea or vomiting).  30 tablet  0  . ondansetron (ZOFRAN) 8 MG tablet Take 1 tablet (8 mg total) by mouth every 8 (eight) hours as needed for nausea or vomiting.  20 tablet  3  . pantoprazole (PROTONIX) 40 MG tablet Take 1 tablet (40 mg total) by mouth daily at 6 (six) AM.  30 tablet  3  . traMADol (ULTRAM) 50 MG tablet Take 1 tablet (50 mg total) by mouth every 6 (six) hours as needed.  30 tablet  0   No current facility-administered medications for this visit.    OBJECTIVE: Middle-aged [place] American woman who appears stated age 41 Vitals:   02/13/14 1140  BP: 131/64  Pulse: 93  Temp: 98.4 F (36.9 C)  Resp: 18     Body mass index is 28.05 kg/(m^2).    ECOG FS:1 - Symptomatic but completely ambulatory  Sclerae unicteric, pupils equal and reactive Oropharynx clear and moist, no thrush or mucositis No cervical or supraclavicular adenopathy Lungs no rales or rhonchi Heart regular rate and rhythm Abd soft, nontender, positive bowel sounds MSK no focal spinal tenderness, no upper extremity lymphedema Neuro: nonfocal, well oriented, appropriate affect Breasts: Deferred   RADIOLOGY DATA   CT chest, abdomen and pelvis 10/26/2013:   1. Postoperative changes of recent right breast surgery with packed open wound which has a thick rind of soft tissue, some of which  enhances, a 1.3 x 1.5 cm enhancing lesion in the medial aspect of the right breast which may represent residual tumor or a malignant  lymph  node, and multiple enlarged right axillary nodes concerning for axillary nodal metastases.  2. No other definite signs of metastatic disease in the chest, abdomen or pelvis.  3. Multiple small ventral hernias containing several loops of small bowel, as well as the mid transverse colon, without associated bowel  incarceration or obstruction at this time.  4. Trace bilateral pleural effusions with mild dependent subsegmental atelectasis in the lower lobes of the lungs bilaterally.  5. Status post cholecystectomy and hysterectomy.      LABS:        ASSESSMENT/PLAN: 58 y.o. [place] woman  #1. status post right modified radical mastectomy 10/26/2013 for a cT4, pT3 pN1, stage IIIB invasive  ductal carcinoma, grade 3, triple negative, with an MIB-1 of 95% . #2. Initial chemotherapy with dose dense doxorubicin and cyclophosphamide x 4 cycles completed 01/09/2014  #3. carboplatin and paclitaxel weekly x12 doses, to started on 01/23/2014. With second week of therapy, she had a mild reaction to Taxol which was adequately treated. Today she got her taxol but did not have any reaction. She will get 2 doses of neupogen for white count recovery.  I will also recommend adding neupogen 2 days after each week of her remaining chemotherapy.  I did a physical exam and checked her labs. She has mild stable anemia related to chemotherapy.  #4  postmastectomy radiation to follow after completion of chemotherapy.  #5 We will continue to monitor her labs weekly and do a toxicity screening and a visit with a provider. We gave a script for Tramadol today.  #6 RTC next week for week 4 of carboplatin and taxol.    Bernadene Bell, MD Medical Hematologist/Oncologist [place] Pager: [phone] Office No: [phone]

## 2014-02-14 ENCOUNTER — Ambulatory Visit (HOSPITAL_BASED_OUTPATIENT_CLINIC_OR_DEPARTMENT_OTHER): Payer: BC Managed Care – PPO

## 2014-02-14 ENCOUNTER — Other Ambulatory Visit: Payer: BC Managed Care – PPO

## 2014-02-14 ENCOUNTER — Ambulatory Visit: Payer: BC Managed Care – PPO | Admitting: Nurse Practitioner

## 2014-02-14 VITALS — BP 138/71 | HR 93 | Temp 98.4°F

## 2014-02-14 DIAGNOSIS — C3490 Malignant neoplasm of unspecified part of unspecified bronchus or lung: Secondary | ICD-10-CM

## 2014-02-14 DIAGNOSIS — Z5189 Encounter for other specified aftercare: Secondary | ICD-10-CM

## 2014-02-14 DIAGNOSIS — C50919 Malignant neoplasm of unspecified site of unspecified female breast: Secondary | ICD-10-CM

## 2014-02-14 MED ORDER — TBO-FILGRASTIM 300 MCG/0.5ML ~~LOC~~ SOSY
300.0000 ug | PREFILLED_SYRINGE | Freq: Once | SUBCUTANEOUS | Status: AC
  Administered 2014-02-14: 300 ug via SUBCUTANEOUS
  Filled 2014-02-14: qty 0.5

## 2014-02-15 ENCOUNTER — Telehealth: Payer: Self-pay | Admitting: Hematology

## 2014-02-15 ENCOUNTER — Ambulatory Visit (HOSPITAL_BASED_OUTPATIENT_CLINIC_OR_DEPARTMENT_OTHER): Payer: BC Managed Care – PPO

## 2014-02-15 VITALS — BP 121/69 | HR 96 | Temp 98.8°F

## 2014-02-15 DIAGNOSIS — C50919 Malignant neoplasm of unspecified site of unspecified female breast: Secondary | ICD-10-CM

## 2014-02-15 DIAGNOSIS — C773 Secondary and unspecified malignant neoplasm of axilla and upper limb lymph nodes: Secondary | ICD-10-CM

## 2014-02-15 DIAGNOSIS — Z5189 Encounter for other specified aftercare: Secondary | ICD-10-CM

## 2014-02-15 MED ORDER — TBO-FILGRASTIM 300 MCG/0.5ML ~~LOC~~ SOSY
300.0000 ug | PREFILLED_SYRINGE | Freq: Once | SUBCUTANEOUS | Status: AC
  Administered 2014-02-15: 300 ug via SUBCUTANEOUS
  Filled 2014-02-15: qty 0.5

## 2014-02-15 NOTE — Telephone Encounter (Signed)
, °

## 2014-02-16 ENCOUNTER — Other Ambulatory Visit: Payer: Self-pay | Admitting: Hematology

## 2014-02-17 ENCOUNTER — Encounter: Payer: Self-pay | Admitting: *Deleted

## 2014-02-17 NOTE — Progress Notes (Signed)
[place] Work  Clinical Social Work continues to follow for resource assistance due to many financial needs. Pt brought in more items for her [employer] and CSW will continue to follow and assist with this process. Pt was in good spirits today and reports to be coping adequately at this time.   Loren Racer, [place] Worker [place]  [place] Phone: [phone] Fax: [phone]

## 2014-02-21 ENCOUNTER — Ambulatory Visit (HOSPITAL_BASED_OUTPATIENT_CLINIC_OR_DEPARTMENT_OTHER): Payer: BC Managed Care – PPO

## 2014-02-21 ENCOUNTER — Telehealth: Payer: Self-pay | Admitting: Oncology

## 2014-02-21 ENCOUNTER — Other Ambulatory Visit (HOSPITAL_BASED_OUTPATIENT_CLINIC_OR_DEPARTMENT_OTHER): Payer: BC Managed Care – PPO

## 2014-02-21 ENCOUNTER — Other Ambulatory Visit: Payer: BC Managed Care – PPO

## 2014-02-21 ENCOUNTER — Ambulatory Visit (HOSPITAL_BASED_OUTPATIENT_CLINIC_OR_DEPARTMENT_OTHER): Payer: BC Managed Care – PPO | Admitting: Hematology

## 2014-02-21 ENCOUNTER — Encounter: Payer: Self-pay | Admitting: Hematology

## 2014-02-21 DIAGNOSIS — D6481 Anemia due to antineoplastic chemotherapy: Secondary | ICD-10-CM

## 2014-02-21 DIAGNOSIS — Z9010 Acquired absence of unspecified breast and nipple: Secondary | ICD-10-CM

## 2014-02-21 DIAGNOSIS — C773 Secondary and unspecified malignant neoplasm of axilla and upper limb lymph nodes: Secondary | ICD-10-CM

## 2014-02-21 DIAGNOSIS — E119 Type 2 diabetes mellitus without complications: Secondary | ICD-10-CM

## 2014-02-21 DIAGNOSIS — C50919 Malignant neoplasm of unspecified site of unspecified female breast: Secondary | ICD-10-CM

## 2014-02-21 DIAGNOSIS — Z5111 Encounter for antineoplastic chemotherapy: Secondary | ICD-10-CM

## 2014-02-21 DIAGNOSIS — T451X5A Adverse effect of antineoplastic and immunosuppressive drugs, initial encounter: Secondary | ICD-10-CM

## 2014-02-21 DIAGNOSIS — Z171 Estrogen receptor negative status [ER-]: Secondary | ICD-10-CM

## 2014-02-21 DIAGNOSIS — N611 Abscess of the breast and nipple: Secondary | ICD-10-CM

## 2014-02-21 DIAGNOSIS — C50911 Malignant neoplasm of unspecified site of right female breast: Secondary | ICD-10-CM

## 2014-02-21 DIAGNOSIS — R0902 Hypoxemia: Secondary | ICD-10-CM

## 2014-02-21 LAB — CBC WITH DIFFERENTIAL/PLATELET
BASO%: 2.1 % — ABNORMAL HIGH (ref 0.0–2.0)
BASOS ABS: 0.1 10*3/uL (ref 0.0–0.1)
EOS%: 3.7 % (ref 0.0–7.0)
Eosinophils Absolute: 0.2 10*3/uL (ref 0.0–0.5)
HCT: 30.9 % — ABNORMAL LOW (ref 34.8–46.6)
HGB: 10.3 g/dL — ABNORMAL LOW (ref 11.6–15.9)
LYMPH%: 28.4 % (ref 14.0–49.7)
MCH: 30.6 pg (ref 25.1–34.0)
MCHC: 33.3 g/dL (ref 31.5–36.0)
MCV: 91.7 fL (ref 79.5–101.0)
MONO#: 0.7 10*3/uL (ref 0.1–0.9)
MONO%: 13.5 % (ref 0.0–14.0)
NEUT%: 52.3 % (ref 38.4–76.8)
NEUTROS ABS: 2.5 10*3/uL (ref 1.5–6.5)
Platelets: 240 10*3/uL (ref 145–400)
RBC: 3.37 10*6/uL — ABNORMAL LOW (ref 3.70–5.45)
RDW: 15.8 % — AB (ref 11.2–14.5)
WBC: 4.8 10*3/uL (ref 3.9–10.3)
lymph#: 1.4 10*3/uL (ref 0.9–3.3)

## 2014-02-21 LAB — COMPREHENSIVE METABOLIC PANEL (CC13)
ALBUMIN: 3.6 g/dL (ref 3.5–5.0)
ALK PHOS: 98 U/L (ref 40–150)
ALT: 9 U/L (ref 0–55)
AST: 13 U/L (ref 5–34)
Anion Gap: 9 mEq/L (ref 3–11)
BUN: 12.5 mg/dL (ref 7.0–26.0)
CO2: 25 mEq/L (ref 22–29)
CREATININE: 0.9 mg/dL (ref 0.6–1.1)
Calcium: 9.1 mg/dL (ref 8.4–10.4)
Chloride: 106 mEq/L (ref 98–109)
Glucose: 195 mg/dl — ABNORMAL HIGH (ref 70–140)
Potassium: 3.9 mEq/L (ref 3.5–5.1)
Sodium: 140 mEq/L (ref 136–145)
Total Bilirubin: <0.2 mg/dL (ref 0.20–1.20)
Total Protein: 6.6 g/dL (ref 6.4–8.3)

## 2014-02-21 MED ORDER — DIPHENHYDRAMINE HCL 50 MG/ML IJ SOLN
25.0000 mg | Freq: Once | INTRAMUSCULAR | Status: AC
  Administered 2014-02-21: 25 mg via INTRAVENOUS

## 2014-02-21 MED ORDER — ONDANSETRON 16 MG/50ML IVPB (CHCC)
16.0000 mg | Freq: Once | INTRAVENOUS | Status: AC
  Administered 2014-02-21: 16 mg via INTRAVENOUS

## 2014-02-21 MED ORDER — SODIUM CHLORIDE 0.9 % IV SOLN
Freq: Once | INTRAVENOUS | Status: AC
  Administered 2014-02-21: 15:00:00 via INTRAVENOUS

## 2014-02-21 MED ORDER — DEXAMETHASONE SODIUM PHOSPHATE 20 MG/5ML IJ SOLN
INTRAMUSCULAR | Status: AC
  Filled 2014-02-21: qty 5

## 2014-02-21 MED ORDER — SODIUM CHLORIDE 0.9 % IJ SOLN
10.0000 mL | INTRAMUSCULAR | Status: DC | PRN
  Administered 2014-02-21: 10 mL
  Filled 2014-02-21: qty 10

## 2014-02-21 MED ORDER — ONDANSETRON 16 MG/50ML IVPB (CHCC)
INTRAVENOUS | Status: AC
  Filled 2014-02-21: qty 16

## 2014-02-21 MED ORDER — DIPHENHYDRAMINE HCL 50 MG/ML IJ SOLN
INTRAMUSCULAR | Status: AC
  Filled 2014-02-21: qty 1

## 2014-02-21 MED ORDER — TRAMADOL HCL 50 MG PO TABS: 50.0000 mg | ORAL_TABLET | Freq: Four times a day (QID) | ORAL | Status: DC | PRN

## 2014-02-21 MED ORDER — DEXTROSE 5 % IV SOLN
80.0000 mg/m2 | Freq: Once | INTRAVENOUS | Status: AC
  Administered 2014-02-21: 150 mg via INTRAVENOUS
  Filled 2014-02-21: qty 25

## 2014-02-21 MED ORDER — HEPARIN SOD (PORK) LOCK FLUSH 100 UNIT/ML IV SOLN
500.0000 [IU] | Freq: Once | INTRAVENOUS | Status: AC | PRN
  Administered 2014-02-21: 500 [IU]
  Filled 2014-02-21: qty 5

## 2014-02-21 MED ORDER — DEXAMETHASONE SODIUM PHOSPHATE 20 MG/5ML IJ SOLN
20.0000 mg | Freq: Once | INTRAMUSCULAR | Status: AC
  Administered 2014-02-21: 20 mg via INTRAVENOUS

## 2014-02-21 MED ORDER — FAMOTIDINE IN NACL 20-0.9 MG/50ML-% IV SOLN
20.0000 mg | Freq: Once | INTRAVENOUS | Status: AC
  Administered 2014-02-21: 20 mg via INTRAVENOUS

## 2014-02-21 MED ORDER — SODIUM CHLORIDE 0.9 % IV SOLN
229.0000 mg | Freq: Once | INTRAVENOUS | Status: AC
  Administered 2014-02-21: 230 mg via INTRAVENOUS
  Filled 2014-02-21: qty 23

## 2014-02-21 MED ORDER — FAMOTIDINE IN NACL 20-0.9 MG/50ML-% IV SOLN
INTRAVENOUS | Status: AC
  Filled 2014-02-21: qty 50

## 2014-02-21 NOTE — Telephone Encounter (Signed)
ADDED INJ APPTS FOR 9/9, 9/10 AND F/U FOR 9/14. S/W PT SHE IS AWARE AND WILL GET NEW SCHEDULE TOMORROW.

## 2014-02-21 NOTE — Telephone Encounter (Signed)
, °

## 2014-02-21 NOTE — Progress Notes (Signed)
[place]  [phone] [phone]     ID: Carmen Stone OB: 03/21/1956  MR#: [id number]  GYF#:[id number]  PCP: Carmen Stone SU: [name] Stone  CHIEF COMPLAINT: Triple negative breast cancer, locally advanced   CURRENT TREATMENT: Adjuvant chemotherapy. Patient on Carboplatin and Taxol weekly regimen. Week 4 or cycle 4 today. Patient reacted to taxol 2 weeks ago but no problem last week or today.  BREAST CANCER HISTORY: As per previously dictated Dr.Magrinat's note:  Carmen Stone presented to the emergency room on 10/22/2013 complaining of right breast pain and discharge. On exam the breast was diffusely erythematous with" orange peel" appearance. She was admitted for debridement and surgery he describes a skin defect in the lower inner quadrant measuring approximately 4 cm. When the breast was breast, air came out through the hole--there are photos included with the admission note. A chest x-ray was obtained which showed the right breast to be asymmetrically enlarged with extensive subcutaneous gas.  On the same day the patient underwent debridement of the wound, with the pathology (SZA [id number]) showing high-grade carcinoma involving ulcerated skin. There was tumor within the dermal and subcutaneous lymphatics. The tumor was negative for estrogen, progesterone, and HER-2(signals ratio 1.24, number per cell 3.6), with an MIB-1 of 95%. Gross cystic disease fluid protein was also negative.  The patient was admitted and started on antibiotics. On 10/24/2013 an attempt was made at further debridement, but the inside of the breast essentially came out in hand falls. The pathology from this procedure(SZA [id number] and 26) again showed high-grade carcinoma.  On 10/25/2013 the patient underwent CT scans of the chest abdomen and pelvis. Aside from disease in the breast and right axilla there was no evidence of metastatic  disease.     Accordingly Dr. Donne Stone proceeded to right mastectomy with axillary lymph node dissection 10/26/2013. Results of this procedure ([id number]) showed a 15 cm defect in the middle of the breast, which is 4 prior necrotic tissue had been scoped out. There was nevertheless extensive invasive ductal carcinoma, grade 3, in the specimen and repeat or blastic panel was again triple negative. 2 of 16 axillary lymph nodes were involved by macrometastatic deposits. The patient was continued on antibiotics and eventually discharged 11/03/2013.   Initial chemotherapy with dose dense doxorubicin and cyclophosphamide x 4 cycles completed 01/09/2014. Now plan is to give carboplatin and paclitaxel weekly x12 doses, today is week 3, and postmastectomy radiation to follow after completion of chemotherapy.  INTERVAL HISTORY:  Carmen Stone returns today for followup of her breast cancer on chemotherapy. She has completed chemotherapy with cyclophosphamide and doxorubicin and received the fourth and final cycle. She got 3 cycles of Carboplatin and Taxol. Patient is doing well. 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 She now gets neupogen shots for 2 days after each week of carboplatin and taxol. Today she was seen in infusion room. No problems with taxol today, mild reaction 2 weeks ago.   REVIEW OF SYSTEMS: Carmen Stone did remarkably well with the first part of her chemotherapy. She never had nausea or vomiting problems. She was fatigued but now recovered. No chest pain, palpitations, SOB, ankle edema, or GI symptoms. She does not like Aleve, which upsets her stomach, and Tylenol is not sufficient. She does not get constipated from the tramadol and she got a script today for it 30 pills. Aside from this a detailed review of systems today was negative.   PAST MEDICAL HISTORY: Past Medical History  Diagnosis Date  . Diabetes mellitus, type II   . Inflammatory breast cancer     PAST SURGICAL  HISTORY: Past Surgical History  Procedure Laterality Date  . Cesarean section    . Ventral hernia repair  2011    repair incacerated VH with biologic mesh; cholecystectomy  . Cholecystectomy  2011  . Incision and drainage of wound Right 10/22/2013    Procedure: IRRIGATION AND DRAINAGE AND DEBRIDEMENT RIGHT BREAST WOUND;  Surgeon: [name], Stone;  Location: [facility];  Service: General;  Laterality: Right;  . Breast biopsy Right 10/22/2013    Procedure: BREAST BIOPSY;  Surgeon: [name], Stone;  Location: [facility];  Service: General;  Laterality: Right;  . Irrigation and debridement abscess Right 10/24/2013    Procedure: Right total mastectomy;  Surgeon: [name], Stone;  Location: [facility];  Service: General;  Laterality: Right;  . Dressing change under anesthesia Right 10/26/2013    Procedure: COMPLETION MASTECTOMY, AXILLARY DISSECTION;  Surgeon: [name], Stone;  Location: [facility];  Service: General;  Laterality: Right;  . Portacath placement N/A 11/16/2013    Procedure: INSERTION PORT-A-CATH;  Surgeon: [name], Stone;  Location: [facility];  Service: General;  Laterality: N/A;    FAMILY HISTORY No family history on file. The patient's father was a heavy smoker and died from complications of emphysema at the age of 55. The patient's mother died from lung cancer at the age of 50. She also was a heavy smoker. The patient had one brother, no sisters. There is no history of breast or ovarian cancer in the family.  GYNECOLOGIC HISTORY:  Menarche age 71, first live birth age 70, the patient is Carmen Stone Carmen Stone. She went through the change of life approximately age 11. She did not take hormone replacement.  SOCIAL HISTORY:  Carmen Stone is divorced. She works for [employer] and also works a second job at [employer] on [employer]. She plans to be on disability during her chemoradiation treatments. She lives alone, but her daughter Carmen Stone frequently stays there overnight. 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 She spends her time between helping her mom and helping her grandfather, Carmen Stone, who lives in [place] and has significant dementia. Mr. Donlan was present at the 11/18/2013 visit only because Carmen Stone wanted to be present and otherwise no one would have been with Mr. Banke. Nida has no grandchildren. She attends a [employer] church   ADVANCED DIRECTIVES: In place. The patient has named her brother Clide Cliff as her healthcare power of attorney. Iona Beard can be reached at [phone]. He is a Pharmacist, hospital locally  HEALTH MAINTENANCE: History  Substance Use Topics  . Smoking status: Never Smoker   . Smokeless tobacco: Not on file  . Alcohol Use: No     Colonoscopy:  PAP:  Bone density:  Lipid panel:  No Known Allergies  Current Outpatient Prescriptions  Medication Sig Dispense Refill  . acetaminophen (TYLENOL) 325 MG tablet Take 650 mg by mouth every 6 (six) hours as needed for mild pain.      Marland Kitchen dexamethasone (DECADRON) 4 MG tablet Take 2 tablets by mouth once a day on the day after chemotherapy and then take 2 tablets two times a day for 2 days. Take with food.  30 tablet  1  . Diphenhyd-Hydrocort-Nystatin (FIRST-DUKES MOUTHWASH) SUSP Swish and spit 5 mls by mouth every 6 hours for mouth sores and use before each meal.  120  mL  1  . glucose blood test strip Use as instructed  100 each  12  . glucose monitoring kit (FREESTYLE) monitoring kit 1 each by Does not apply route as needed for other. Check blood sugars daily.  1 each  0  . Insulin Glargine (LANTUS SOLOSTAR) 100 UNIT/ML Solostar Pen Inject 10 Units into the skin daily at 10 pm.  15 mL  3  . linagliptin (TRADJENTA) 5 MG TABS tablet Take 1 tablet (5 mg total) by mouth daily.  30 tablet  0  . LORazepam (ATIVAN) 0.5 MG tablet Take 1 tablet (0.5 mg total) by mouth at bedtime as needed (Nausea or vomiting).  30 tablet  0  . ondansetron (ZOFRAN) 8 MG tablet  Take 1 tablet (8 mg total) by mouth every 8 (eight) hours as needed for nausea or vomiting.  20 tablet  3  . pantoprazole (PROTONIX) 40 MG tablet Take 1 tablet (40 mg total) by mouth daily at 6 (six) AM.  30 tablet  3  . traMADol (ULTRAM) 50 MG tablet Take 1 tablet (50 mg total) by mouth every 6 (six) hours as needed.  30 tablet  0   No current facility-administered medications for this visit.   Facility-Administered Medications Ordered in Other Visits  Medication Dose Route Frequency Provider Last Rate Last Dose  . CARBOplatin (PARAPLATIN) 230 mg in sodium chloride 0.9 % 100 mL chemo infusion  230 mg Intravenous Once Aliahna Statzer Marla Roe, Stone 246 mL/hr at 02/21/14 1630 230 mg at 02/21/14 1630  . heparin lock flush 100 unit/mL  500 Units Intracatheter Once PRN Obi Scrima Marla Roe, Stone      . sodium chloride 0.9 % injection 10 mL  10 mL Intracatheter PRN Liese Dizdarevic Marla Roe, Stone        OBJECTIVE: Middle-aged [place] American woman who appears stated age There were no vitals filed for this visit.   There is no weight on file to calculate BMI.    ECOG FS:1 - Symptomatic but completely ambulatory  Sclerae unicteric, pupils equal and reactive Oropharynx clear and moist, no thrush or mucositis No cervical or supraclavicular adenopathy Lungs no rales or rhonchi Heart regular rate and rhythm Abd soft, nontender, positive bowel sounds MSK no focal spinal tenderness, no upper extremity lymphedema Neuro: nonfocal, well oriented, appropriate affect Breasts: Deferred   RADIOLOGY DATA   CT chest, abdomen and pelvis 10/26/2013:   1. Postoperative changes of recent right breast surgery with packed open wound which has a thick rind of soft tissue, some of which  enhances, a 1.3 x 1.5 cm enhancing lesion in the medial aspect of the right breast which may represent residual tumor or a malignant  lymph node, and multiple enlarged right axillary nodes concerning for axillary nodal metastases.  2. No  other definite signs of metastatic disease in the chest, abdomen or pelvis.  3. Multiple small ventral hernias containing several loops of small bowel, as well as the mid transverse colon, without associated bowel  incarceration or obstruction at this time.  4. Trace bilateral pleural effusions with mild dependent subsegmental atelectasis in the lower lobes of the lungs bilaterally.  5. Status post cholecystectomy and hysterectomy.      LABS:        ASSESSMENT/PLAN: 58 y.o. [place] woman  #1. status post right modified radical mastectomy 10/26/2013 for a cT4, pT3 pN1, stage IIIB invasive ductal carcinoma, grade 3, triple negative, with an MIB-1 of 95% . #2. Initial chemotherapy with dose dense doxorubicin  and cyclophosphamide x 4 cycles completed 01/09/2014  #3. carboplatin and paclitaxel weekly x12 doses, started on 01/23/2014. With second week of therapy, she had a mild reaction to Taxol which was adequately treated. Today she got her 4th dose of taxol but did not have any reaction. She will get 2 doses of neupogen for white count recovery.  I did a physical exam and checked her labs. She has mild stable anemia related to chemotherapy.  #4  postmastectomy radiation to follow after completion of chemotherapy.  #5 We will continue to monitor her labs weekly and do a toxicity screening and a visit with a provider. We gave a script for Tramadol today.  #6 RTC next week for week 5 of carboplatin and taxol.    Bernadene Bell, [facility] Medical Hematologist/Oncologist [place] Pager: [phone] Office No: [phone]

## 2014-02-21 NOTE — Patient Instructions (Signed)
[facility] Discharge Instructions for Patients Receiving Chemotherapy  Today you received the following chemotherapy agents Paclitaxel/Carboplatin.   To help prevent nausea and vomiting after your treatment, we encourage you to take your nausea medication as directed.    If you develop nausea and vomiting that is not controlled by your nausea medication, call the clinic.   BELOW ARE SYMPTOMS THAT SHOULD BE REPORTED IMMEDIATELY:  *FEVER GREATER THAN 100.5 F  *CHILLS WITH OR WITHOUT FEVER  NAUSEA AND VOMITING THAT IS NOT CONTROLLED WITH YOUR NAUSEA MEDICATION  *UNUSUAL SHORTNESS OF BREATH  *UNUSUAL BRUISING OR BLEEDING  TENDERNESS IN MOUTH AND THROAT WITH OR WITHOUT PRESENCE OF ULCERS  *URINARY PROBLEMS  *BOWEL PROBLEMS  UNUSUAL RASH Items with * indicate a potential emergency and should be followed up as soon as possible.  Feel free to call the clinic you have any questions or concerns. The clinic phone number is [phone].    

## 2014-02-22 ENCOUNTER — Ambulatory Visit (HOSPITAL_BASED_OUTPATIENT_CLINIC_OR_DEPARTMENT_OTHER): Payer: BC Managed Care – PPO

## 2014-02-22 VITALS — BP 143/58 | HR 99 | Temp 98.4°F | Resp 20

## 2014-02-22 DIAGNOSIS — C50919 Malignant neoplasm of unspecified site of unspecified female breast: Secondary | ICD-10-CM

## 2014-02-22 DIAGNOSIS — Z5189 Encounter for other specified aftercare: Secondary | ICD-10-CM

## 2014-02-22 DIAGNOSIS — C773 Secondary and unspecified malignant neoplasm of axilla and upper limb lymph nodes: Secondary | ICD-10-CM

## 2014-02-22 MED ORDER — TBO-FILGRASTIM 300 MCG/0.5ML ~~LOC~~ SOSY
300.0000 ug | PREFILLED_SYRINGE | Freq: Once | SUBCUTANEOUS | Status: AC
  Administered 2014-02-22: 300 ug via SUBCUTANEOUS
  Filled 2014-02-22: qty 0.5

## 2014-02-22 MED ORDER — FILGRASTIM 300 MCG/0.5ML IJ SOLN: 300.0000 ug | Freq: Once | INTRAMUSCULAR | Status: DC

## 2014-02-22 NOTE — Patient Instructions (Signed)

## 2014-02-23 ENCOUNTER — Ambulatory Visit (HOSPITAL_BASED_OUTPATIENT_CLINIC_OR_DEPARTMENT_OTHER): Payer: BC Managed Care – PPO

## 2014-02-23 VITALS — BP 128/65 | HR 91 | Temp 98.2°F

## 2014-02-23 DIAGNOSIS — Z5189 Encounter for other specified aftercare: Secondary | ICD-10-CM

## 2014-02-23 DIAGNOSIS — C50919 Malignant neoplasm of unspecified site of unspecified female breast: Secondary | ICD-10-CM

## 2014-02-23 MED ORDER — TBO-FILGRASTIM 300 MCG/0.5ML ~~LOC~~ SOSY
300.0000 ug | PREFILLED_SYRINGE | Freq: Once | SUBCUTANEOUS | Status: AC
  Administered 2014-02-23: 300 ug via SUBCUTANEOUS
  Filled 2014-02-23: qty 0.5

## 2014-02-23 NOTE — Patient Instructions (Signed)

## 2014-02-27 ENCOUNTER — Ambulatory Visit (HOSPITAL_BASED_OUTPATIENT_CLINIC_OR_DEPARTMENT_OTHER): Payer: BC Managed Care – PPO | Admitting: Hematology

## 2014-02-27 ENCOUNTER — Ambulatory Visit (HOSPITAL_BASED_OUTPATIENT_CLINIC_OR_DEPARTMENT_OTHER): Payer: BC Managed Care – PPO

## 2014-02-27 ENCOUNTER — Other Ambulatory Visit (HOSPITAL_BASED_OUTPATIENT_CLINIC_OR_DEPARTMENT_OTHER): Payer: BC Managed Care – PPO

## 2014-02-27 ENCOUNTER — Telehealth: Payer: Self-pay | Admitting: Hematology

## 2014-02-27 ENCOUNTER — Other Ambulatory Visit: Payer: BC Managed Care – PPO

## 2014-02-27 VITALS — BP 150/60 | HR 97 | Temp 99.1°F | Resp 18 | Ht 64.0 in | Wt 167.3 lb

## 2014-02-27 DIAGNOSIS — C50319 Malignant neoplasm of lower-inner quadrant of unspecified female breast: Secondary | ICD-10-CM

## 2014-02-27 DIAGNOSIS — C50911 Malignant neoplasm of unspecified site of right female breast: Secondary | ICD-10-CM

## 2014-02-27 DIAGNOSIS — N611 Abscess of the breast and nipple: Secondary | ICD-10-CM

## 2014-02-27 DIAGNOSIS — Z23 Encounter for immunization: Secondary | ICD-10-CM

## 2014-02-27 DIAGNOSIS — C50919 Malignant neoplasm of unspecified site of unspecified female breast: Secondary | ICD-10-CM

## 2014-02-27 DIAGNOSIS — Z171 Estrogen receptor negative status [ER-]: Secondary | ICD-10-CM

## 2014-02-27 DIAGNOSIS — Z5111 Encounter for antineoplastic chemotherapy: Secondary | ICD-10-CM

## 2014-02-27 DIAGNOSIS — C50912 Malignant neoplasm of unspecified site of left female breast: Secondary | ICD-10-CM

## 2014-02-27 LAB — CBC WITH DIFFERENTIAL/PLATELET
BASO%: 1.7 % (ref 0.0–2.0)
Basophils Absolute: 0.1 10*3/uL (ref 0.0–0.1)
EOS%: 3.5 % (ref 0.0–7.0)
Eosinophils Absolute: 0.1 10*3/uL (ref 0.0–0.5)
HEMATOCRIT: 30.2 % — AB (ref 34.8–46.6)
HGB: 10 g/dL — ABNORMAL LOW (ref 11.6–15.9)
LYMPH%: 28.3 % (ref 14.0–49.7)
MCH: 31.2 pg (ref 25.1–34.0)
MCHC: 33.2 g/dL (ref 31.5–36.0)
MCV: 93.9 fL (ref 79.5–101.0)
MONO#: 0.4 10*3/uL (ref 0.1–0.9)
MONO%: 9.1 % (ref 0.0–14.0)
NEUT#: 2.4 10*3/uL (ref 1.5–6.5)
NEUT%: 57.4 % (ref 38.4–76.8)
Platelets: 239 10*3/uL (ref 145–400)
RBC: 3.22 10*6/uL — ABNORMAL LOW (ref 3.70–5.45)
RDW: 17.6 % — ABNORMAL HIGH (ref 11.2–14.5)
WBC: 4.2 10*3/uL (ref 3.9–10.3)
lymph#: 1.2 10*3/uL (ref 0.9–3.3)

## 2014-02-27 LAB — COMPREHENSIVE METABOLIC PANEL (CC13)
ALK PHOS: 99 U/L (ref 40–150)
ALT: 11 U/L (ref 0–55)
AST: 12 U/L (ref 5–34)
Albumin: 3.6 g/dL (ref 3.5–5.0)
Anion Gap: 8 mEq/L (ref 3–11)
BUN: 13.3 mg/dL (ref 7.0–26.0)
CALCIUM: 9 mg/dL (ref 8.4–10.4)
CHLORIDE: 105 meq/L (ref 98–109)
CO2: 26 mEq/L (ref 22–29)
CREATININE: 0.7 mg/dL (ref 0.6–1.1)
Glucose: 178 mg/dl — ABNORMAL HIGH (ref 70–140)
Potassium: 4.1 mEq/L (ref 3.5–5.1)
Sodium: 140 mEq/L (ref 136–145)
Total Bilirubin: 0.41 mg/dL (ref 0.20–1.20)
Total Protein: 6.4 g/dL (ref 6.4–8.3)

## 2014-02-27 MED ORDER — FAMOTIDINE IN NACL 20-0.9 MG/50ML-% IV SOLN
INTRAVENOUS | Status: AC
Start: 2014-02-27 — End: 2014-02-27
  Filled 2014-02-27: qty 50

## 2014-02-27 MED ORDER — DEXAMETHASONE SODIUM PHOSPHATE 20 MG/5ML IJ SOLN
INTRAMUSCULAR | Status: AC
  Filled 2014-02-27: qty 5

## 2014-02-27 MED ORDER — DEXTROSE 5 % IV SOLN
80.0000 mg/m2 | Freq: Once | INTRAVENOUS | Status: AC
  Administered 2014-02-27: 150 mg via INTRAVENOUS
  Filled 2014-02-27: qty 25

## 2014-02-27 MED ORDER — DIPHENHYDRAMINE HCL 50 MG/ML IJ SOLN
INTRAMUSCULAR | Status: AC
  Filled 2014-02-27: qty 1

## 2014-02-27 MED ORDER — SODIUM CHLORIDE 0.9 % IV SOLN
Freq: Once | INTRAVENOUS | Status: AC
  Administered 2014-02-27: 14:00:00 via INTRAVENOUS

## 2014-02-27 MED ORDER — ONDANSETRON 16 MG/50ML IVPB (CHCC)
INTRAVENOUS | Status: AC
  Filled 2014-02-27: qty 16

## 2014-02-27 MED ORDER — FAMOTIDINE IN NACL 20-0.9 MG/50ML-% IV SOLN
20.0000 mg | Freq: Once | INTRAVENOUS | Status: AC
  Administered 2014-02-27: 20 mg via INTRAVENOUS

## 2014-02-27 MED ORDER — HEPARIN SOD (PORK) LOCK FLUSH 100 UNIT/ML IV SOLN
500.0000 [IU] | Freq: Once | INTRAVENOUS | Status: AC | PRN
  Administered 2014-02-27: 500 [IU]
  Filled 2014-02-27: qty 5

## 2014-02-27 MED ORDER — DIPHENHYDRAMINE HCL 50 MG/ML IJ SOLN
25.0000 mg | Freq: Once | INTRAMUSCULAR | Status: AC
  Administered 2014-02-27: 25 mg via INTRAVENOUS

## 2014-02-27 MED ORDER — ONDANSETRON 16 MG/50ML IVPB (CHCC)
16.0000 mg | Freq: Once | INTRAVENOUS | Status: AC
  Administered 2014-02-27: 16 mg via INTRAVENOUS

## 2014-02-27 MED ORDER — SODIUM CHLORIDE 0.9 % IJ SOLN
10.0000 mL | INTRAMUSCULAR | Status: DC | PRN
  Administered 2014-02-27: 10 mL
  Filled 2014-02-27: qty 10

## 2014-02-27 MED ORDER — SODIUM CHLORIDE 0.9 % IV SOLN
229.0000 mg | Freq: Once | INTRAVENOUS | Status: AC
  Administered 2014-02-27: 230 mg via INTRAVENOUS
  Filled 2014-02-27: qty 23

## 2014-02-27 MED ORDER — DEXAMETHASONE SODIUM PHOSPHATE 20 MG/5ML IJ SOLN
20.0000 mg | Freq: Once | INTRAMUSCULAR | Status: AC
  Administered 2014-02-27: 20 mg via INTRAVENOUS

## 2014-02-27 MED ORDER — INFLUENZA VAC SPLIT QUAD 0.5 ML IM SUSY
0.5000 mL | PREFILLED_SYRINGE | Freq: Once | INTRAMUSCULAR | Status: AC
  Administered 2014-02-27: 0.5 mL via INTRAMUSCULAR
  Filled 2014-02-27: qty 0.5

## 2014-02-27 MED ORDER — TRAMADOL HCL 50 MG PO TABS: 50.0000 mg | ORAL_TABLET | Freq: Four times a day (QID) | ORAL | Status: DC | PRN

## 2014-02-27 NOTE — Patient Instructions (Signed)
[facility] Discharge Instructions for Patients Receiving Chemotherapy  Today you received the following chemotherapy agents taxol/carboplatin  To help prevent nausea and vomiting after your treatment, we encourage you to take your nausea medication as directed   If you develop nausea and vomiting that is not controlled by your nausea medication, call the clinic.   BELOW ARE SYMPTOMS THAT SHOULD BE REPORTED IMMEDIATELY:  *FEVER GREATER THAN 100.5 F  *CHILLS WITH OR WITHOUT FEVER  NAUSEA AND VOMITING THAT IS NOT CONTROLLED WITH YOUR NAUSEA MEDICATION  *UNUSUAL SHORTNESS OF BREATH  *UNUSUAL BRUISING OR BLEEDING  TENDERNESS IN MOUTH AND THROAT WITH OR WITHOUT PRESENCE OF ULCERS  *URINARY PROBLEMS  *BOWEL PROBLEMS  UNUSUAL RASH Items with * indicate a potential emergency and should be followed up as soon as possible.  Feel free to call the clinic you have any questions or concerns. The clinic phone number is [phone].  

## 2014-02-27 NOTE — Progress Notes (Signed)
[place]  [phone] [phone]     ID: [name] OB: 1955-12-03  MR#: [id number]  CWC#:[id number]  PCP: [name], MD SU: [name] MD  CHIEF COMPLAINT: Triple negative breast cancer, locally advanced   CURRENT TREATMENT: Adjuvant chemotherapy. Patient on Carboplatin and Taxol weekly regimen. Week 5 or cycle 5 today. Patient reacted to taxol 2 weeks ago but no problem last week or today.  BREAST CANCER HISTORY: As per previously dictated Dr.Magrinat's note:  Ms. Mantione presented to the emergency room on 10/22/2013 complaining of right breast pain and discharge. On exam the breast was diffusely erythematous with" orange peel" appearance. She was admitted for debridement and surgery he describes a skin defect in the lower inner quadrant measuring approximately 4 cm. When the breast was breast, air came out through the hole--there are photos included with the admission note. A chest x-ray was obtained which showed the right breast to be asymmetrically enlarged with extensive subcutaneous gas.  On the same day the patient underwent debridement of the wound, with the pathology (SZA 15-2010) showing high-grade carcinoma involving ulcerated skin. There was tumor within the dermal and subcutaneous lymphatics. The tumor was negative for estrogen, progesterone, and HER-2(signals ratio 1.24, number per cell 3.6), with an MIB-1 of 95%. Gross cystic disease fluid protein was also negative.  The patient was admitted and started on antibiotics. On 10/24/2013 an attempt was made at further debridement, but the inside of the breast essentially came out in hand falls. The pathology from this procedure(SZA 15-20 and 26) again showed high-grade carcinoma.  On 10/25/2013 the patient underwent CT scans of the chest abdomen and pelvis. Aside from disease in the breast and right axilla there was no evidence of metastatic  disease.     Accordingly Dr. [name] proceeded to right mastectomy with axillary lymph node dissection 10/26/2013. Results of this procedure (SZA [id number]) showed a 15 cm defect in the middle of the breast, which is 4 prior necrotic tissue had been scoped out. There was nevertheless extensive invasive ductal carcinoma, grade 3, in the specimen and repeat or blastic panel was again triple negative. 2 of 16 axillary lymph nodes were involved by macrometastatic deposits. The patient was continued on antibiotics and eventually discharged 11/03/2013.   Initial chemotherapy with dose dense doxorubicin and cyclophosphamide x 4 cycles completed 01/09/2014. Now plan is to give carboplatin and paclitaxel weekly x12 doses, today is week 3, and postmastectomy radiation to follow after completion of chemotherapy.  INTERVAL HISTORY:  Gracelynn returns today for followup of her breast cancer on chemotherapy. She has completed chemotherapy with cyclophosphamide and doxorubicin and received the fourth and final cycle. She got 3 cycles of Carboplatin and Taxol. Patient is doing well. She has alopecia complete now from [facility] chemo. She now gets neupogen shots for 2 days after each week of carboplatin and taxol. Her nails have started to come off. Here is a picture below. She wears a glove on her right hand which is more affected than the left. She applies cream on it as well.         REVIEW OF SYSTEMS: Bernetha did remarkably well with the first part of her chemotherapy. She never had nausea or vomiting problems. She was fatigued but now recovered. No chest pain, palpitations, SOB, ankle edema, or GI symptoms. She does not like Aleve, which upsets her stomach, and Tylenol is not sufficient. She does not get constipated from the tramadol and she got a script today  for it 30 pills. Aside from this a detailed review of systems today was negative.   PAST MEDICAL HISTORY: Past Medical History  Diagnosis Date  .  Diabetes mellitus, type II   . Inflammatory breast cancer     PAST SURGICAL HISTORY: Past Surgical History  Procedure Laterality Date  . Cesarean section    . Ventral hernia repair  2011    repair incacerated VH with biologic mesh; cholecystectomy  . Cholecystectomy  2011  . Incision and drainage of wound Right 10/22/2013    Procedure: IRRIGATION AND DRAINAGE AND DEBRIDEMENT RIGHT BREAST WOUND;  Surgeon: [name], MD;  Location: [place];  Service: General;  Laterality: Right;  . Breast biopsy Right 10/22/2013    Procedure: BREAST BIOPSY;  Surgeon: [name], MD;  Location: [place];  Service: General;  Laterality: Right;  . Irrigation and debridement abscess Right 10/24/2013    Procedure: Right total mastectomy;  Surgeon: [name], MD;  Location: [place];  Service: General;  Laterality: Right;  . Dressing change under anesthesia Right 10/26/2013    Procedure: COMPLETION MASTECTOMY, AXILLARY DISSECTION;  Surgeon: [name], MD;  Location: [place];  Service: General;  Laterality: Right;  . Portacath placement N/A 11/16/2013    Procedure: INSERTION PORT-A-CATH;  Surgeon: [name], MD;  Location: [place];  Service: General;  Laterality: N/A;    FAMILY HISTORY No family history on file. The patient's father was a heavy smoker and died from complications of emphysema at the age of 51. The patient's mother died from lung cancer at the age of 8. She also was a heavy smoker. The patient had one brother, no sisters. There is no history of breast or ovarian cancer in the family.  GYNECOLOGIC HISTORY:  Menarche age 63, first live birth age 78, the patient is GX P1. She went through the change of life approximately age 38. She did not take hormone replacement.  SOCIAL HISTORY:  Mavis is divorced. She works for [employer] and also works a second job at [employer] on [employer]. She plans to be on disability during her chemoradiation treatments. She lives  alone, but her daughter [name] frequently stays there overnight. [name] was [employer]. 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 She spends her time between helping her mom and helping her grandfather, Amai Cappiello, who lives in [place] and has significant dementia. Mr. Winiarski was present at the 11/18/2013 visit only because [name] wanted to be present and otherwise no one would have been with Mr. Greeson. Lottie has no grandchildren. She attends a Nurse, adult church   ADVANCED DIRECTIVES: In place. The patient has named her brother Clide Cliff as her healthcare power of attorney. Iona Beard can be reached at [phone]. He is a Pharmacist, hospital locally  HEALTH MAINTENANCE: History  Substance Use Topics  . Smoking status: Never Smoker   . Smokeless tobacco: Not on file  . Alcohol Use: No     Colonoscopy:  PAP:  Bone density:  Lipid panel:  No Known Allergies  Current Outpatient Prescriptions  Medication Sig Dispense Refill  . acetaminophen (TYLENOL) 325 MG tablet Take 650 mg by mouth every 6 (six) hours as needed for mild pain.      Marland Kitchen dexamethasone (DECADRON) 4 MG tablet Take 2 tablets by mouth once a day on the day after chemotherapy and then take 2 tablets two times a day for 2 days. Take with food.  30 tablet  1  .  Diphenhyd-Hydrocort-Nystatin (FIRST-DUKES MOUTHWASH) SUSP Swish and spit 5 mls by mouth every 6 hours for mouth sores and use before each meal.  120 mL  1  . glucose blood test strip Use as instructed  100 each  12  . glucose monitoring kit (FREESTYLE) monitoring kit 1 each by Does not apply route as needed for other. Check blood sugars daily.  1 each  0  . Insulin Glargine (LANTUS SOLOSTAR) 100 UNIT/ML Solostar Pen Inject 10 Units into the skin daily at 10 pm.  15 mL  3  . linagliptin (TRADJENTA) 5 MG TABS tablet Take 1 tablet (5 mg total) by mouth daily.  30 tablet  0  . LORazepam (ATIVAN) 0.5 MG tablet Take 1 tablet (0.5 mg total) by mouth at bedtime as  needed (Nausea or vomiting).  30 tablet  0  . ondansetron (ZOFRAN) 8 MG tablet Take 1 tablet (8 mg total) by mouth every 8 (eight) hours as needed for nausea or vomiting.  20 tablet  3  . pantoprazole (PROTONIX) 40 MG tablet Take 1 tablet (40 mg total) by mouth daily at 6 (six) AM.  30 tablet  3  . traMADol (ULTRAM) 50 MG tablet Take 1 tablet (50 mg total) by mouth every 6 (six) hours as needed.  30 tablet  0   No current facility-administered medications for this visit.   Facility-Administered Medications Ordered in Other Visits  Medication Dose Route Frequency Provider Last Rate Last Dose  . sodium chloride 0.9 % injection 10 mL  10 mL Intracatheter PRN Shakelia Scrivner Marla Roe, MD   10 mL at 02/27/14 1617    OBJECTIVE: Middle-aged [place] American woman who appears stated age 74 Vitals:   02/27/14 1216  BP: 150/60  Pulse: 97  Temp: 99.1 F (37.3 C)  Resp: 18     Body mass index is 28.7 kg/(m^2).    ECOG FS:1 - Symptomatic but completely ambulatory  Sclerae unicteric, pupils equal and reactive Oropharynx clear and moist, no thrush or mucositis No cervical or supraclavicular adenopathy Lungs no rales or rhonchi Heart regular rate and rhythm Abd soft, nontender, positive bowel sounds MSK no focal spinal tenderness, no upper extremity lymphedema, nails breaking. pic above Neuro: nonfocal, well oriented, appropriate affect Breasts: Deferred   RADIOLOGY DATA   CT chest, abdomen and pelvis 10/26/2013:   1. Postoperative changes of recent right breast surgery with packed open wound which has a thick rind of soft tissue, some of which  enhances, a 1.3 x 1.5 cm enhancing lesion in the medial aspect of the right breast which may represent residual tumor or a malignant  lymph node, and multiple enlarged right axillary nodes concerning for axillary nodal metastases.  2. No other definite signs of metastatic disease in the chest, abdomen or pelvis.  3. Multiple small ventral hernias  containing several loops of small bowel, as well as the mid transverse colon, without associated bowel  incarceration or obstruction at this time.  4. Trace bilateral pleural effusions with mild dependent subsegmental atelectasis in the lower lobes of the lungs bilaterally.  5. Status post cholecystectomy and hysterectomy.      LABS:        ASSESSMENT/PLAN: 58 y.o. [place] woman  #1. status post right modified radical mastectomy 10/26/2013 for a cT4, pT3 pN1, stage IIIB invasive ductal carcinoma, grade 3, triple negative, with an MIB-1 of 95% . #2. Initial chemotherapy with dose dense doxorubicin and cyclophosphamide x 4 cycles completed 01/09/2014  #3. carboplatin and paclitaxel  weekly x12 doses, started on 01/23/2014. With second week of therapy, she had a mild reaction to Taxol which was adequately treated. Today she got her 5th dose of taxol but did not have any reaction. She will get 2 doses of neupogen for white count recovery.  I did a physical exam and checked her labs. She has mild stable anemia related to chemotherapy.  #4  postmastectomy radiation to follow after completion of chemotherapy.  #5 We will continue to monitor her labs weekly and do a toxicity screening and a visit with a provider. We gave a script for Tramadol today.  #6 RTC next week for week 6 of carboplatin and taxol.    Bernadene Bell, MD Medical Hematologist/Oncologist [place] Pager: [phone] Office No: [phone]

## 2014-02-27 NOTE — Telephone Encounter (Signed)
gv adn printed appt sched and avs for ptf or Sept..Marland Kitchen

## 2014-02-28 ENCOUNTER — Ambulatory Visit (HOSPITAL_BASED_OUTPATIENT_CLINIC_OR_DEPARTMENT_OTHER): Payer: BC Managed Care – PPO

## 2014-02-28 VITALS — BP 137/71 | HR 95 | Temp 98.5°F

## 2014-02-28 DIAGNOSIS — C50919 Malignant neoplasm of unspecified site of unspecified female breast: Secondary | ICD-10-CM

## 2014-02-28 DIAGNOSIS — Z5189 Encounter for other specified aftercare: Secondary | ICD-10-CM

## 2014-02-28 MED ORDER — TBO-FILGRASTIM 300 MCG/0.5ML ~~LOC~~ SOSY
300.0000 ug | PREFILLED_SYRINGE | Freq: Once | SUBCUTANEOUS | Status: DC
  Filled 2014-02-28: qty 0.5

## 2014-02-28 MED ORDER — FILGRASTIM 300 MCG/0.5ML IJ SOLN
300.0000 ug | Freq: Once | INTRAMUSCULAR | Status: DC
  Filled 2014-02-28: qty 0.5

## 2014-02-28 MED ORDER — TBO-FILGRASTIM 300 MCG/0.5ML ~~LOC~~ SOSY
300.0000 ug | PREFILLED_SYRINGE | Freq: Once | SUBCUTANEOUS | Status: AC
  Administered 2014-02-28: 300 ug via SUBCUTANEOUS
  Filled 2014-02-28: qty 0.5

## 2014-03-01 ENCOUNTER — Ambulatory Visit (HOSPITAL_BASED_OUTPATIENT_CLINIC_OR_DEPARTMENT_OTHER): Payer: BC Managed Care – PPO

## 2014-03-01 VITALS — BP 126/58 | HR 101 | Temp 98.5°F

## 2014-03-01 DIAGNOSIS — C50919 Malignant neoplasm of unspecified site of unspecified female breast: Secondary | ICD-10-CM

## 2014-03-01 DIAGNOSIS — Z5189 Encounter for other specified aftercare: Secondary | ICD-10-CM

## 2014-03-01 MED ORDER — TBO-FILGRASTIM 300 MCG/0.5ML ~~LOC~~ SOSY
300.0000 ug | PREFILLED_SYRINGE | Freq: Once | SUBCUTANEOUS | Status: AC
Start: 2014-03-02 — End: 2014-03-01
  Administered 2014-03-01: 300 ug via SUBCUTANEOUS
  Filled 2014-03-01: qty 0.5

## 2014-03-06 ENCOUNTER — Other Ambulatory Visit (HOSPITAL_BASED_OUTPATIENT_CLINIC_OR_DEPARTMENT_OTHER): Payer: BC Managed Care – PPO

## 2014-03-06 ENCOUNTER — Other Ambulatory Visit: Payer: Self-pay | Admitting: *Deleted

## 2014-03-06 ENCOUNTER — Ambulatory Visit (HOSPITAL_BASED_OUTPATIENT_CLINIC_OR_DEPARTMENT_OTHER): Payer: BC Managed Care – PPO

## 2014-03-06 ENCOUNTER — Ambulatory Visit: Payer: BC Managed Care – PPO

## 2014-03-06 ENCOUNTER — Other Ambulatory Visit: Payer: BC Managed Care – PPO

## 2014-03-06 ENCOUNTER — Ambulatory Visit (HOSPITAL_BASED_OUTPATIENT_CLINIC_OR_DEPARTMENT_OTHER): Payer: BC Managed Care – PPO | Admitting: Hematology

## 2014-03-06 VITALS — BP 120/53 | HR 85 | Temp 98.1°F | Resp 18 | Ht 64.0 in | Wt 167.1 lb

## 2014-03-06 DIAGNOSIS — Z171 Estrogen receptor negative status [ER-]: Secondary | ICD-10-CM

## 2014-03-06 DIAGNOSIS — C50919 Malignant neoplasm of unspecified site of unspecified female breast: Secondary | ICD-10-CM

## 2014-03-06 DIAGNOSIS — C773 Secondary and unspecified malignant neoplasm of axilla and upper limb lymph nodes: Secondary | ICD-10-CM

## 2014-03-06 DIAGNOSIS — C50911 Malignant neoplasm of unspecified site of right female breast: Secondary | ICD-10-CM

## 2014-03-06 DIAGNOSIS — N611 Abscess of the breast and nipple: Secondary | ICD-10-CM

## 2014-03-06 DIAGNOSIS — Z5111 Encounter for antineoplastic chemotherapy: Secondary | ICD-10-CM

## 2014-03-06 LAB — COMPREHENSIVE METABOLIC PANEL (CC13)
ALBUMIN: 3.5 g/dL (ref 3.5–5.0)
ALT: 13 U/L (ref 0–55)
AST: 14 U/L (ref 5–34)
Alkaline Phosphatase: 94 U/L (ref 40–150)
Anion Gap: 9 mEq/L (ref 3–11)
BUN: 17.2 mg/dL (ref 7.0–26.0)
CO2: 24 mEq/L (ref 22–29)
Calcium: 8.9 mg/dL (ref 8.4–10.4)
Chloride: 108 mEq/L (ref 98–109)
Creatinine: 0.7 mg/dL (ref 0.6–1.1)
Glucose: 137 mg/dl (ref 70–140)
POTASSIUM: 3.6 meq/L (ref 3.5–5.1)
SODIUM: 141 meq/L (ref 136–145)
TOTAL PROTEIN: 6.1 g/dL — AB (ref 6.4–8.3)

## 2014-03-06 LAB — CBC WITH DIFFERENTIAL/PLATELET
BASO%: 1.8 % (ref 0.0–2.0)
Basophils Absolute: 0.1 10*3/uL (ref 0.0–0.1)
EOS%: 1.9 % (ref 0.0–7.0)
Eosinophils Absolute: 0.1 10*3/uL (ref 0.0–0.5)
HCT: 26.4 % — ABNORMAL LOW (ref 34.8–46.6)
HGB: 8.7 g/dL — ABNORMAL LOW (ref 11.6–15.9)
LYMPH%: 36.3 % (ref 14.0–49.7)
MCH: 31.4 pg (ref 25.1–34.0)
MCHC: 33 g/dL (ref 31.5–36.0)
MCV: 95.2 fL (ref 79.5–101.0)
MONO#: 0.7 10*3/uL (ref 0.1–0.9)
MONO%: 13.8 % (ref 0.0–14.0)
NEUT#: 2.2 10*3/uL (ref 1.5–6.5)
NEUT%: 46.2 % (ref 38.4–76.8)
PLATELETS: 249 10*3/uL (ref 145–400)
RBC: 2.78 10*6/uL — AB (ref 3.70–5.45)
RDW: 17.2 % — AB (ref 11.2–14.5)
WBC: 4.8 10*3/uL (ref 3.9–10.3)
lymph#: 1.7 10*3/uL (ref 0.9–3.3)

## 2014-03-06 MED ORDER — SODIUM CHLORIDE 0.9 % IJ SOLN
10.0000 mL | INTRAMUSCULAR | Status: DC | PRN
  Administered 2014-03-06: 10 mL
  Filled 2014-03-06: qty 10

## 2014-03-06 MED ORDER — ONDANSETRON 16 MG/50ML IVPB (CHCC)
16.0000 mg | Freq: Once | INTRAVENOUS | Status: AC
  Administered 2014-03-06: 16 mg via INTRAVENOUS

## 2014-03-06 MED ORDER — DIPHENHYDRAMINE HCL 50 MG/ML IJ SOLN
25.0000 mg | Freq: Once | INTRAMUSCULAR | Status: AC
  Administered 2014-03-06: 25 mg via INTRAVENOUS

## 2014-03-06 MED ORDER — HEPARIN SOD (PORK) LOCK FLUSH 100 UNIT/ML IV SOLN
500.0000 [IU] | Freq: Once | INTRAVENOUS | Status: AC | PRN
  Administered 2014-03-06: 500 [IU]
  Filled 2014-03-06: qty 5

## 2014-03-06 MED ORDER — DEXTROSE 5 % IV SOLN
80.0000 mg/m2 | Freq: Once | INTRAVENOUS | Status: AC
  Administered 2014-03-06: 150 mg via INTRAVENOUS
  Filled 2014-03-06: qty 25

## 2014-03-06 MED ORDER — TRAMADOL HCL 50 MG PO TABS: 50.0000 mg | ORAL_TABLET | Freq: Four times a day (QID) | ORAL | Status: DC | PRN

## 2014-03-06 MED ORDER — SODIUM CHLORIDE 0.9 % IV SOLN
Freq: Once | INTRAVENOUS | Status: AC
  Administered 2014-03-06: 15:00:00 via INTRAVENOUS

## 2014-03-06 MED ORDER — FAMOTIDINE IN NACL 20-0.9 MG/50ML-% IV SOLN
20.0000 mg | Freq: Once | INTRAVENOUS | Status: AC
  Administered 2014-03-06: 20 mg via INTRAVENOUS

## 2014-03-06 MED ORDER — DIPHENHYDRAMINE HCL 50 MG/ML IJ SOLN
INTRAMUSCULAR | Status: AC
  Filled 2014-03-06: qty 1

## 2014-03-06 MED ORDER — ONDANSETRON 16 MG/50ML IVPB (CHCC)
INTRAVENOUS | Status: AC
  Filled 2014-03-06: qty 16

## 2014-03-06 MED ORDER — FAMOTIDINE IN NACL 20-0.9 MG/50ML-% IV SOLN
INTRAVENOUS | Status: AC
  Filled 2014-03-06: qty 50

## 2014-03-06 MED ORDER — DEXAMETHASONE SODIUM PHOSPHATE 20 MG/5ML IJ SOLN
20.0000 mg | Freq: Once | INTRAMUSCULAR | Status: AC
  Administered 2014-03-06: 20 mg via INTRAVENOUS

## 2014-03-06 MED ORDER — SODIUM CHLORIDE 0.9 % IV SOLN
229.0000 mg | Freq: Once | INTRAVENOUS | Status: AC
  Administered 2014-03-06: 230 mg via INTRAVENOUS
  Filled 2014-03-06: qty 23

## 2014-03-06 MED ORDER — DEXAMETHASONE SODIUM PHOSPHATE 20 MG/5ML IJ SOLN
INTRAMUSCULAR | Status: AC
  Filled 2014-03-06: qty 5

## 2014-03-06 NOTE — Patient Instructions (Signed)
[facility] Discharge Instructions for Patients Receiving Chemotherapy  Today you received the following chemotherapy agents taxol/carboplatin  To help prevent nausea and vomiting after your treatment, we encourage you to take your nausea medication as directed   If you develop nausea and vomiting that is not controlled by your nausea medication, call the clinic.   BELOW ARE SYMPTOMS THAT SHOULD BE REPORTED IMMEDIATELY:  *FEVER GREATER THAN 100.5 F  *CHILLS WITH OR WITHOUT FEVER  NAUSEA AND VOMITING THAT IS NOT CONTROLLED WITH YOUR NAUSEA MEDICATION  *UNUSUAL SHORTNESS OF BREATH  *UNUSUAL BRUISING OR BLEEDING  TENDERNESS IN MOUTH AND THROAT WITH OR WITHOUT PRESENCE OF ULCERS  *URINARY PROBLEMS  *BOWEL PROBLEMS  UNUSUAL RASH Items with * indicate a potential emergency and should be followed up as soon as possible.  Feel free to call the clinic you have any questions or concerns. The clinic phone number is [phone].  

## 2014-03-07 ENCOUNTER — Other Ambulatory Visit: Payer: Self-pay | Admitting: Hematology

## 2014-03-07 ENCOUNTER — Ambulatory Visit (HOSPITAL_BASED_OUTPATIENT_CLINIC_OR_DEPARTMENT_OTHER): Payer: BC Managed Care – PPO

## 2014-03-07 ENCOUNTER — Other Ambulatory Visit: Payer: Self-pay | Admitting: *Deleted

## 2014-03-07 ENCOUNTER — Encounter: Payer: Self-pay | Admitting: Hematology

## 2014-03-07 DIAGNOSIS — Z5189 Encounter for other specified aftercare: Secondary | ICD-10-CM

## 2014-03-07 DIAGNOSIS — C50919 Malignant neoplasm of unspecified site of unspecified female breast: Secondary | ICD-10-CM

## 2014-03-07 MED ORDER — TBO-FILGRASTIM 300 MCG/0.5ML ~~LOC~~ SOSY
300.0000 ug | PREFILLED_SYRINGE | Freq: Once | SUBCUTANEOUS | Status: AC
  Administered 2014-03-07: 300 ug via SUBCUTANEOUS
  Filled 2014-03-07: qty 0.5

## 2014-03-07 NOTE — Progress Notes (Signed)
[place]  Telephone:([phone] Fax:(336) [phone]     ID: [name] OB: 10/11/1955  MR#: [id number]  BUL#:[id number]  PCP: [name], MD SU: [name] MD  CHIEF COMPLAINT: Triple negative breast cancer, locally advanced   CURRENT TREATMENT: Adjuvant chemotherapy. Patient on Carboplatin and Taxol weekly regimen. Week 6 or cycle 6 today. Patient reacted to taxol 3 weeks ago but no problem last week or today.  BREAST CANCER HISTORY: As per previously dictated Dr.Magrinat's note:  Ms. Ines presented to the emergency room on 10/22/2013 complaining of right breast pain and discharge. On exam the breast was diffusely erythematous with" orange peel" appearance. She was admitted for debridement and surgery he describes a skin defect in the lower inner quadrant measuring approximately 4 cm. When the breast was breast, air came out through the hole--there are photos included with the admission note. A chest x-ray was obtained which showed the right breast to be asymmetrically enlarged with extensive subcutaneous gas.  On the same day the patient underwent debridement of the wound, with the pathology (SZA 15-2010) showing high-grade carcinoma involving ulcerated skin. There was tumor within the dermal and subcutaneous lymphatics. The tumor was negative for estrogen, progesterone, and HER-2(signals ratio 1.24, number per cell 3.6), with an MIB-1 of 95%. Gross cystic disease fluid protein was also negative.  The patient was admitted and started on antibiotics. On 10/24/2013 an attempt was made at further debridement, but the inside of the breast essentially came out in hand falls. The pathology from this procedure(SZA 15-20 and 26) again showed high-grade carcinoma.  On 10/25/2013 the patient underwent CT scans of the chest abdomen and pelvis. Aside from disease in the breast and right axilla there was no evidence of metastatic  disease.     Accordingly Dr. [name] proceeded to right mastectomy with axillary lymph node dissection 10/26/2013. Results of this procedure (SZA [id number]) showed a 15 cm defect in the middle of the breast, which is 4 prior necrotic tissue had been scoped out. There was nevertheless extensive invasive ductal carcinoma, grade 3, in the specimen and repeat or blastic panel was again triple negative. 2 of 16 axillary lymph nodes were involved by macrometastatic deposits. The patient was continued on antibiotics and eventually discharged 11/03/2013.  Initial chemotherapy with dose dense doxorubicin and cyclophosphamide x 4 cycles completed 01/09/2014. Now plan is to give carboplatin and paclitaxel weekly x12 doses, today is week 6, and postmastectomy radiation to follow after completion of chemotherapy.  INTERVAL HISTORY:  Kashawn returns today for followup of her breast cancer on chemotherapy. She has completed chemotherapy with cyclophosphamide and doxorubicin and received the fourth and final cycle. She got 5 cycles of Carboplatin and Taxol. Patient is doing well. She has alopecia complete now from [facility] chemo. She now gets neupogen shots for 2 days after each week of carboplatin and taxol. Her nails have started to come off. Here is a picture below from last week. She wears a glove on her right hand which is more affected than the left. She applies cream on it as well.         REVIEW OF SYSTEMS: Marquise did remarkably well with the first part of her chemotherapy. She never had nausea or vomiting problems. She was fatigued but now recovered. No chest pain, palpitations, SOB, ankle edema, or GI symptoms. She does not like Aleve, which upsets her stomach, and Tylenol is not sufficient. She does not get constipated from the tramadol and she got a  script today for it 30 pills. Aside from this a detailed review of systems today was negative.   PAST MEDICAL HISTORY: Past Medical History  Diagnosis  Date  . Diabetes mellitus, type II   . Inflammatory breast cancer     PAST SURGICAL HISTORY:q2`  Past Surgical History  Procedure Laterality Date  . Cesarean section    . Ventral hernia repair  2011    repair incacerated VH with biologic mesh; cholecystectomy  . Cholecystectomy  2011  . Incision and drainage of wound Right 10/22/2013    Procedure: IRRIGATION AND DRAINAGE AND DEBRIDEMENT RIGHT BREAST WOUND;  Surgeon: [name], MD;  Location: [place];  Service: General;  Laterality: Right;  . Breast biopsy Right 10/22/2013    Procedure: BREAST BIOPSY;  Surgeon: [name], MD;  Location: [place];  Service: General;  Laterality: Right;  . Irrigation and debridement abscess Right 10/24/2013    Procedure: Right total mastectomy;  Surgeon: [name], MD;  Location: [place];  Service: General;  Laterality: Right;  . Dressing change under anesthesia Right 10/26/2013    Procedure: COMPLETION MASTECTOMY, AXILLARY DISSECTION;  Surgeon: [name], MD;  Location: [place];  Service: General;  Laterality: Right;  . Portacath placement N/A 11/16/2013    Procedure: INSERTION PORT-A-CATH;  Surgeon: [name], MD;  Location: [place];  Service: General;  Laterality: N/A;    FAMILY HISTORY No family history on file. The patient's father was a heavy smoker and died from complications of emphysema at the age of 1. The patient's mother died from lung cancer at the age of 79. She also was a heavy smoker. The patient had one brother, no sisters. There is no history of breast or ovarian cancer in the family.  GYNECOLOGIC HISTORY:  Menarche age 89, first live birth age 31, the patient is GX P1. She went through the change of life approximately age 89. She did not take hormone replacement.  SOCIAL HISTORY:  [place] is divorced. She works for [employer] and also works a second job at [employer] on [employer]. She plans to be on disability during her chemoradiation treatments.  She lives alone, but her daughter [name] frequently stays there overnight. [name] was [employer]. 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 She spends her time between helping her mom and helping her grandfather, Amera Banos, who lives in [place] and has significant dementia. Mr. Erkkila was present at the 11/18/2013 visit only because [name] wanted to be present and otherwise no one would have been with Mr. Baggett. Deyja has no grandchildren. She attends a Nurse, adult church.   ADVANCED DIRECTIVES: In place. The patient has named her brother Clide Cliff as her healthcare power of attorney. Iona Beard can be reached at [phone]. He is a Pharmacist, hospital locally  HEALTH MAINTENANCE: History  Substance Use Topics  . Smoking status: Never Smoker   . Smokeless tobacco: Not on file  . Alcohol Use: No     Colonoscopy:  PAP:  Bone density:  Lipid panel:  No Known Allergies  Current Outpatient Prescriptions  Medication Sig Dispense Refill  . acetaminophen (TYLENOL) 325 MG tablet Take 650 mg by mouth every 6 (six) hours as needed for mild pain.      Marland Kitchen dexamethasone (DECADRON) 4 MG tablet Take 2 tablets by mouth once a day on the day after chemotherapy and then take 2 tablets two times a day for 2 days. Take with food.  30 tablet  1  . Diphenhyd-Hydrocort-Nystatin (FIRST-DUKES MOUTHWASH) SUSP Swish and spit 5 mls by mouth every 6 hours for mouth sores and use before each meal.  120 mL  1  . glucose blood test strip Use as instructed  100 each  12  . glucose monitoring kit (FREESTYLE) monitoring kit 1 each by Does not apply route as needed for other. Check blood sugars daily.  1 each  0  . Insulin Glargine (LANTUS SOLOSTAR) 100 UNIT/ML Solostar Pen Inject 10 Units into the skin daily at 10 pm.  15 mL  3  . linagliptin (TRADJENTA) 5 MG TABS tablet Take 1 tablet (5 mg total) by mouth daily.  30 tablet  0  . LORazepam (ATIVAN) 0.5 MG tablet Take 1 tablet (0.5 mg total) by mouth at  bedtime as needed (Nausea or vomiting).  30 tablet  0  . ondansetron (ZOFRAN) 8 MG tablet Take 1 tablet (8 mg total) by mouth every 8 (eight) hours as needed for nausea or vomiting.  20 tablet  3  . pantoprazole (PROTONIX) 40 MG tablet Take 1 tablet (40 mg total) by mouth daily at 6 (six) AM.  30 tablet  3  . traMADol (ULTRAM) 50 MG tablet Take 1 tablet (50 mg total) by mouth every 6 (six) hours as needed.  30 tablet  0   No current facility-administered medications for this visit.    OBJECTIVE: Middle-aged [place] American woman who appears stated age 58 Vitals:   03/06/14 1436  BP: 120/53  Pulse: 85  Temp: 98.1 F (36.7 C)  Resp: 18     Body mass index is 28.67 kg/(m^2).    ECOG FS:1  Sclerae unicteric, pupils equal and reactive Oropharynx clear and moist, no thrush or mucositis No cervical or supraclavicular adenopathy Lungs no rales or rhonchi Heart regular rate and rhythm Abd soft, nontender, positive bowel sounds MSK no focal spinal tenderness, no upper extremity lymphedema, nails breaking. pic above Neuro: nonfocal, well oriented, appropriate affect Breasts: Deferred   RADIOLOGY DATA   CT chest, abdomen and pelvis 10/26/2013:   1. Postoperative changes of recent right breast surgery with packed open wound which has a thick rind of soft tissue, some of which  enhances, a 1.3 x 1.5 cm enhancing lesion in the medial aspect of the right breast which may represent residual tumor or a malignant  lymph node, and multiple enlarged right axillary nodes concerning for axillary nodal metastases.  2. No other definite signs of metastatic disease in the chest, abdomen or pelvis.  3. Multiple small ventral hernias containing several loops of small bowel, as well as the mid transverse colon, without associated bowel  incarceration or obstruction at this time.  4. Trace bilateral pleural effusions with mild dependent subsegmental atelectasis in the lower lobes of the lungs  bilaterally.  5. Status post cholecystectomy and hysterectomy.      LABS: Reviewed.  electrolytes normal, renal function normal. lfts normal.  Wbc 4.8 hb 8.7 plt 249     ASSESSMENT/PLAN: 58 y.o. [place] woman  #1. status post right modified radical mastectomy 10/26/2013 for a cT4, pT3 pN1, stage IIIB invasive ductal carcinoma, grade 3, triple negative, with an MIB-1 of 95% . #2. Initial chemotherapy with dose dense doxorubicin and cyclophosphamide x 4 cycles completed 01/09/2014  #3. carboplatin and paclitaxel weekly x12 doses, started on 01/23/2014. With second week of therapy, she had a mild reaction to Taxol which was adequately treated. Today she got her 6th dose of taxol but did  not have any reaction. She will get 2 doses of neupogen for white count recovery.  I did a physical exam and checked her labs. She has moderate anemia related to chemotherapy. She will start taking iron daily. No transfusion unless hemoglobin is below 8 grams.  #4  postmastectomy radiation to follow after completion of chemotherapy.  #5 We will continue to monitor her labs weekly and do a toxicity screening and a visit with a provider. We gave a script for Tramadol today.  #6 RTC next week for week 7 of carboplatin and taxol.  #7 Post-completion of chemo, she will be referred for XRT.    Bernadene Bell, MD Medical Hematologist/Oncologist [place] Pager: [phone] Office No: [phone]

## 2014-03-08 ENCOUNTER — Ambulatory Visit (HOSPITAL_BASED_OUTPATIENT_CLINIC_OR_DEPARTMENT_OTHER): Payer: BC Managed Care – PPO

## 2014-03-08 VITALS — BP 124/59 | HR 91 | Temp 98.5°F

## 2014-03-08 DIAGNOSIS — C773 Secondary and unspecified malignant neoplasm of axilla and upper limb lymph nodes: Secondary | ICD-10-CM

## 2014-03-08 DIAGNOSIS — Z5189 Encounter for other specified aftercare: Secondary | ICD-10-CM

## 2014-03-08 DIAGNOSIS — C50919 Malignant neoplasm of unspecified site of unspecified female breast: Secondary | ICD-10-CM

## 2014-03-08 MED ORDER — TBO-FILGRASTIM 300 MCG/0.5ML ~~LOC~~ SOSY
300.0000 ug | PREFILLED_SYRINGE | Freq: Once | SUBCUTANEOUS | Status: AC
  Administered 2014-03-08: 300 ug via SUBCUTANEOUS
  Filled 2014-03-08: qty 0.5

## 2014-03-13 ENCOUNTER — Telehealth: Payer: Self-pay | Admitting: Hematology

## 2014-03-13 ENCOUNTER — Ambulatory Visit (HOSPITAL_BASED_OUTPATIENT_CLINIC_OR_DEPARTMENT_OTHER): Payer: BC Managed Care – PPO

## 2014-03-13 ENCOUNTER — Encounter: Payer: Self-pay | Admitting: Hematology

## 2014-03-13 ENCOUNTER — Other Ambulatory Visit (HOSPITAL_BASED_OUTPATIENT_CLINIC_OR_DEPARTMENT_OTHER): Payer: BC Managed Care – PPO

## 2014-03-13 ENCOUNTER — Ambulatory Visit (HOSPITAL_BASED_OUTPATIENT_CLINIC_OR_DEPARTMENT_OTHER): Payer: BC Managed Care – PPO | Admitting: Hematology

## 2014-03-13 ENCOUNTER — Other Ambulatory Visit: Payer: BC Managed Care – PPO

## 2014-03-13 VITALS — BP 119/56 | HR 90 | Temp 98.6°F | Resp 17 | Ht 64.0 in | Wt 170.2 lb

## 2014-03-13 DIAGNOSIS — C50919 Malignant neoplasm of unspecified site of unspecified female breast: Secondary | ICD-10-CM

## 2014-03-13 DIAGNOSIS — D709 Neutropenia, unspecified: Secondary | ICD-10-CM

## 2014-03-13 DIAGNOSIS — C773 Secondary and unspecified malignant neoplasm of axilla and upper limb lymph nodes: Secondary | ICD-10-CM

## 2014-03-13 DIAGNOSIS — Z171 Estrogen receptor negative status [ER-]: Secondary | ICD-10-CM

## 2014-03-13 LAB — CBC WITH DIFFERENTIAL/PLATELET
BASO%: 2 % (ref 0.0–2.0)
BASOS ABS: 0.1 10*3/uL (ref 0.0–0.1)
EOS%: 1.9 % (ref 0.0–7.0)
Eosinophils Absolute: 0.1 10*3/uL (ref 0.0–0.5)
HCT: 24.8 % — ABNORMAL LOW (ref 34.8–46.6)
HEMOGLOBIN: 8.2 g/dL — AB (ref 11.6–15.9)
LYMPH%: 34.4 % (ref 14.0–49.7)
MCH: 32 pg (ref 25.1–34.0)
MCHC: 33.1 g/dL (ref 31.5–36.0)
MCV: 96.7 fL (ref 79.5–101.0)
MONO#: 0.5 10*3/uL (ref 0.1–0.9)
MONO%: 16.5 % — AB (ref 0.0–14.0)
NEUT#: 1.3 10*3/uL — ABNORMAL LOW (ref 1.5–6.5)
NEUT%: 45.2 % (ref 38.4–76.8)
Platelets: 235 10*3/uL (ref 145–400)
RBC: 2.57 10*6/uL — AB (ref 3.70–5.45)
RDW: 17.3 % — ABNORMAL HIGH (ref 11.2–14.5)
WBC: 2.8 10*3/uL — ABNORMAL LOW (ref 3.9–10.3)
lymph#: 1 10*3/uL (ref 0.9–3.3)

## 2014-03-13 LAB — COMPREHENSIVE METABOLIC PANEL (CC13)
ALBUMIN: 3.2 g/dL — AB (ref 3.5–5.0)
ALK PHOS: 81 U/L (ref 40–150)
ALT: 9 U/L (ref 0–55)
AST: 13 U/L (ref 5–34)
Anion Gap: 6 mEq/L (ref 3–11)
BILIRUBIN TOTAL: 0.2 mg/dL (ref 0.20–1.20)
BUN: 13.7 mg/dL (ref 7.0–26.0)
CO2: 27 mEq/L (ref 22–29)
Calcium: 8.7 mg/dL (ref 8.4–10.4)
Chloride: 108 mEq/L (ref 98–109)
Creatinine: 0.8 mg/dL (ref 0.6–1.1)
Glucose: 185 mg/dl — ABNORMAL HIGH (ref 70–140)
Potassium: 3.6 mEq/L (ref 3.5–5.1)
Sodium: 141 mEq/L (ref 136–145)
Total Protein: 5.8 g/dL — ABNORMAL LOW (ref 6.4–8.3)

## 2014-03-13 MED ORDER — TBO-FILGRASTIM 300 MCG/0.5ML ~~LOC~~ SOSY
300.0000 ug | PREFILLED_SYRINGE | Freq: Once | SUBCUTANEOUS | Status: AC
  Administered 2014-03-13: 300 ug via SUBCUTANEOUS
  Filled 2014-03-13: qty 0.5

## 2014-03-13 MED ORDER — FILGRASTIM 300 MCG/0.5ML IJ SOLN: 300.0000 ug | Freq: Once | INTRAMUSCULAR | Status: DC

## 2014-03-13 MED ORDER — TRAMADOL HCL 50 MG PO TABS: 50.0000 mg | ORAL_TABLET | Freq: Four times a day (QID) | ORAL | Status: DC | PRN

## 2014-03-13 NOTE — Progress Notes (Signed)
Spoke with Juliann Pulse, RN and Dr. Lona Kettle is seeing patient now. Took Granix 300 mcg injection over to [place] to give to patient after Dr. Lona Kettle finishes seeing her.

## 2014-03-13 NOTE — Telephone Encounter (Signed)
gv pt appt schedule for sept/oct

## 2014-03-13 NOTE — Progress Notes (Signed)
[place]  Telephone:(336) [phone] Fax:(336) [phone]  Clinic Follow up Note 03/13/2014    ID: Carmen Stone OB: 07-03-1955  MR#: [id number]  OTL#:[id number]  PCP: [name], MD SU: [name] MD  CHIEF COMPLAINT: Triple negative breast cancer, locally advanced   CURRENT TREATMENT: Adjuvant chemotherapy. Patient on Carboplatin and Taxol weekly regimen. Week 7 or cycle 7 held today due to low white count.   BREAST CANCER HISTORY: As per previously dictated Dr.Magrinat's note:  [name] presented to the emergency room on 10/22/2013 complaining of right breast pain and discharge. On exam the breast was diffusely erythematous with" orange peel" appearance. She was admitted for debridement and surgery he describes a skin defect in the lower inner quadrant measuring approximately 4 cm. When the breast was breast, air came out through the hole--there are photos included with the admission note. A chest x-ray was obtained which showed the right breast to be asymmetrically enlarged with extensive subcutaneous gas.  On the same day the patient underwent debridement of the wound, with the pathology (SZA 15-2010) showing high-grade carcinoma involving ulcerated skin. There was tumor within the dermal and subcutaneous lymphatics. The tumor was negative for estrogen, progesterone, and HER-2(signals ratio 1.24, number per cell 3.6), with an MIB-1 of 95%. Gross cystic disease fluid protein was also negative.  The patient was admitted and started on antibiotics. On 10/24/2013 an attempt was made at further debridement, but the inside of the breast essentially came out in hand falls. The pathology from this procedure(SZA 15-20 and 26) again showed high-grade carcinoma.  On 10/25/2013 the patient underwent CT scans of the chest abdomen and pelvis. Aside from disease in the breast and right axilla there was no evidence of metastatic disease.     Accordingly Dr.  [name] proceeded to right mastectomy with axillary lymph node dissection 10/26/2013. Results of this procedure (SZA 15-2093) showed a 15 cm defect in the middle of the breast, which is 4 prior necrotic tissue had been scoped out. There was nevertheless extensive invasive ductal carcinoma, grade 3, in the specimen and repeat or blastic panel was again triple negative. 2 of 16 axillary lymph nodes were involved by macrometastatic deposits. The patient was continued on antibiotics and eventually discharged 11/03/2013.  Initial chemotherapy with dose dense doxorubicin and cyclophosphamide x 4 cycles completed 01/09/2014. Now plan is to give carboplatin and paclitaxel weekly x12 doses, today is week 6, and postmastectomy radiation to follow after completion of chemotherapy.  INTERVAL HISTORY:  Carmen Stone returns today for followup of her breast cancer on chemotherapy. She has completed chemotherapy with cyclophosphamide and doxorubicin and received the fourth and final cycle. She got 5 cycles of Carboplatin and Taxol. Patient is doing well. She has alopecia complete now from [facility] chemo. She now gets neupogen shots for 2 days after each week of carboplatin and taxol. Her nails have started to come off. Here is a picture below from last week. She wears a glove on her right hand which is more affected than the left. She applies cream on it as well.       REVIEW OF SYSTEMS: Carmen Stone did remarkably well with the first part of her chemotherapy. She never had nausea or vomiting problems. She was fatigued but now recovered. No chest pain, palpitations, SOB, ankle edema, or GI symptoms. She does not like Aleve, which upsets her stomach, and Tylenol is not sufficient. She does not get constipated from the tramadol and she got a script today for it  30 pills.she asked for protonix but she has 3 refills on that.  Aside from this a detailed review of systems today was negative.   PAST MEDICAL HISTORY: Past Medical  History  Diagnosis Date  . Diabetes mellitus, type II   . Inflammatory breast cancer     PAST SURGICAL HISTORY:q2`  Past Surgical History  Procedure Laterality Date  . Cesarean section    . Ventral hernia repair  2011    repair incacerated VH with biologic mesh; cholecystectomy  . Cholecystectomy  2011  . Incision and drainage of wound Right 10/22/2013    Procedure: IRRIGATION AND DRAINAGE AND DEBRIDEMENT RIGHT BREAST WOUND;  Surgeon: [name], MD;  Location: [place];  Service: General;  Laterality: Right;  . Breast biopsy Right 10/22/2013    Procedure: BREAST BIOPSY;  Surgeon: [name], MD;  Location: [place];  Service: General;  Laterality: Right;  . Irrigation and debridement abscess Right 10/24/2013    Procedure: Right total mastectomy;  Surgeon: [name], MD;  Location: [place];  Service: General;  Laterality: Right;  . Dressing change under anesthesia Right 10/26/2013    Procedure: COMPLETION MASTECTOMY, AXILLARY DISSECTION;  Surgeon: [name], MD;  Location: [place];  Service: General;  Laterality: Right;  . Portacath placement N/A 11/16/2013    Procedure: INSERTION PORT-A-CATH;  Surgeon: [name], MD;  Location: [place];  Service: General;  Laterality: N/A;    FAMILY HISTORY No family history on file. The patient's father was a heavy smoker and died from complications of emphysema at the age of 39. The patient's mother died from lung cancer at the age of 82. She also was a heavy smoker. The patient had one brother, no sisters. There is no history of breast or ovarian cancer in the family.  GYNECOLOGIC HISTORY:  Menarche age 72, first live birth age 44, the patient is GX P1. She went through the change of life approximately age 39. She did not take hormone replacement.  SOCIAL HISTORY:  Carmen Stone is divorced. She works for [employer] and also works a second job at [employer] on [employer]. She plans to be on disability during her  chemoradiation treatments. She lives alone, but her daughter Carmen Stone frequently stays there overnight. 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 She spends her time between helping her mom and helping her grandfather, Carmen Stone, who lives in [place] and has significant dementia. Mr. Corado was present at the 11/18/2013 visit only because Carmen Stone wanted to be present and otherwise no one would have been with Mr. Hobbins. Kealani has no grandchildren. She attends a Nurse, adult church.   ADVANCED DIRECTIVES: In place. The patient has named her brother Carmen Stone as her healthcare power of attorney. Carmen Stone can be reached at [phone]. He is a Pharmacist, hospital locally  HEALTH MAINTENANCE: History  Substance Use Topics  . Smoking status: Never Smoker   . Smokeless tobacco: Not on file  . Alcohol Use: No     Colonoscopy:  PAP:  Bone density:  Lipid panel:  No Known Allergies  Current Outpatient Prescriptions  Medication Sig Dispense Refill  . acetaminophen (TYLENOL) 325 MG tablet Take 650 mg by mouth every 6 (six) hours as needed for mild pain.      Marland Kitchen dexamethasone (DECADRON) 4 MG tablet Take 2 tablets by mouth once a day on the day after chemotherapy and then take 2 tablets two times a day for 2 days.  Take with food.  30 tablet  1  . Diphenhyd-Hydrocort-Nystatin (FIRST-DUKES MOUTHWASH) SUSP Swish and spit 5 mls by mouth every 6 hours for mouth sores and use before each meal.  120 mL  1  . glucose blood test strip Use as instructed  100 each  12  . glucose monitoring kit (FREESTYLE) monitoring kit 1 each by Does not apply route as needed for other. Check blood sugars daily.  1 each  0  . Insulin Glargine (LANTUS SOLOSTAR) 100 UNIT/ML Solostar Pen Inject 10 Units into the skin daily at 10 pm.  15 mL  3  . linagliptin (TRADJENTA) 5 MG TABS tablet Take 1 tablet (5 mg total) by mouth daily.  30 tablet  0  . LORazepam (ATIVAN) 0.5 MG tablet Take 1 tablet  (0.5 mg total) by mouth at bedtime as needed (Nausea or vomiting).  30 tablet  0  . ondansetron (ZOFRAN) 8 MG tablet Take 1 tablet (8 mg total) by mouth every 8 (eight) hours as needed for nausea or vomiting.  20 tablet  3  . pantoprazole (PROTONIX) 40 MG tablet Take 1 tablet (40 mg total) by mouth daily at 6 (six) AM.  30 tablet  3  . traMADol (ULTRAM) 50 MG tablet Take 1 tablet (50 mg total) by mouth every 6 (six) hours as needed.  30 tablet  0   No current facility-administered medications for this visit.   Facility-Administered Medications Ordered in Other Visits  Medication Dose Route Frequency Provider Last Rate Last Dose  . Tbo-Filgrastim (GRANIX) injection 300 mcg  300 mcg Subcutaneous Once Carmen Domeier Marla Roe, MD        OBJECTIVE: Middle-aged [place] American woman who appears stated age 58 Vitals:   03/13/14 1354  BP: 119/56  Pulse: 90  Temp: 98.6 F (37 C)  Resp: 17     Body mass index is 29.2 kg/(m^2).    ECOG FS:1  Sclerae unicteric, pupils equal and reactive Oropharynx clear and moist, no thrush or mucositis No cervical or supraclavicular adenopathy Lungs no rales or rhonchi Heart regular rate and rhythm Abd soft, nontender, positive bowel sounds MSK no focal spinal tenderness, no upper extremity lymphedema, nails breaking. pic above Neuro: nonfocal, well oriented, appropriate affect Breasts: Deferred   RADIOLOGY DATA   CT chest, abdomen and pelvis 10/26/2013:   1. Postoperative changes of recent right breast surgery with packed open wound which has a thick rind of soft tissue, some of which  enhances, a 1.3 x 1.5 cm enhancing lesion in the medial aspect of the right breast which may represent residual tumor or a malignant  lymph node, and multiple enlarged right axillary nodes concerning for axillary nodal metastases.  2. No other definite signs of metastatic disease in the chest, abdomen or pelvis.  3. Multiple small ventral hernias containing several  loops of small bowel, as well as the mid transverse colon, without associated bowel  incarceration or obstruction at this time.  4. Trace bilateral pleural effusions with mild dependent subsegmental atelectasis in the lower lobes of the lungs bilaterally.  5. Status post cholecystectomy and hysterectomy.      LABS: Reviewed.         ASSESSMENT/PLAN: 58 y.o. [place] woman  #1. status post right modified radical mastectomy 10/26/2013 for a cT4, pT3 pN1, stage IIIB invasive ductal carcinoma, grade 3, triple negative, with an MIB-1 of 95% . #2. Initial chemotherapy with dose dense doxorubicin and cyclophosphamide x 4 cycles completed 01/09/2014  #3. carboplatin  and paclitaxel weekly x12 doses, started on 01/23/2014. With second week of therapy, she had a mild reaction to Taxol which was adequately treated. Today her 7th week of Taxol was held due to neutropenia. She will get 2 doses of neupogen for white count recovery.  I did a physical exam and checked her labs. She has moderate anemia related to chemotherapy. She will start taking iron daily. No transfusion unless hemoglobin is below 8 grams.  #4  postmastectomy radiation to follow after completion of chemotherapy.  #5 We will continue to monitor her labs weekly and do a toxicity screening and a visit with a provider. We gave a script for Tramadol today.  #6 RTC next week for week 7 of carboplatin and taxol.  #7 Post-completion of chemo, she will be referred for XRT.    Bernadene Bell, MD Medical Hematologist/Oncologist [place] Pager: [phone] Office No: [phone]

## 2014-03-14 ENCOUNTER — Ambulatory Visit (HOSPITAL_BASED_OUTPATIENT_CLINIC_OR_DEPARTMENT_OTHER): Payer: BC Managed Care – PPO

## 2014-03-14 VITALS — BP 109/49 | HR 100 | Temp 98.7°F

## 2014-03-14 DIAGNOSIS — Z5189 Encounter for other specified aftercare: Secondary | ICD-10-CM

## 2014-03-14 DIAGNOSIS — C50919 Malignant neoplasm of unspecified site of unspecified female breast: Secondary | ICD-10-CM

## 2014-03-14 MED ORDER — TBO-FILGRASTIM 300 MCG/0.5ML ~~LOC~~ SOSY
300.0000 ug | PREFILLED_SYRINGE | Freq: Once | SUBCUTANEOUS | Status: AC
  Administered 2014-03-14: 300 ug via SUBCUTANEOUS
  Filled 2014-03-14: qty 0.5

## 2014-03-15 ENCOUNTER — Encounter: Payer: Self-pay | Admitting: *Deleted

## 2014-03-20 ENCOUNTER — Encounter: Payer: Self-pay | Admitting: Hematology

## 2014-03-20 ENCOUNTER — Telehealth: Payer: Self-pay | Admitting: Physician Assistant

## 2014-03-20 ENCOUNTER — Ambulatory Visit (HOSPITAL_BASED_OUTPATIENT_CLINIC_OR_DEPARTMENT_OTHER): Payer: BC Managed Care – PPO | Admitting: Hematology

## 2014-03-20 ENCOUNTER — Other Ambulatory Visit (HOSPITAL_BASED_OUTPATIENT_CLINIC_OR_DEPARTMENT_OTHER): Payer: BC Managed Care – PPO

## 2014-03-20 ENCOUNTER — Ambulatory Visit (HOSPITAL_BASED_OUTPATIENT_CLINIC_OR_DEPARTMENT_OTHER): Payer: BC Managed Care – PPO

## 2014-03-20 ENCOUNTER — Telehealth: Payer: Self-pay | Admitting: *Deleted

## 2014-03-20 VITALS — BP 137/59 | HR 96 | Temp 98.5°F | Resp 18 | Ht 64.0 in | Wt 168.3 lb

## 2014-03-20 DIAGNOSIS — C50811 Malignant neoplasm of overlapping sites of right female breast: Secondary | ICD-10-CM

## 2014-03-20 DIAGNOSIS — C50919 Malignant neoplasm of unspecified site of unspecified female breast: Secondary | ICD-10-CM

## 2014-03-20 DIAGNOSIS — IMO0001 Reserved for inherently not codable concepts without codable children: Secondary | ICD-10-CM

## 2014-03-20 DIAGNOSIS — C773 Secondary and unspecified malignant neoplasm of axilla and upper limb lymph nodes: Secondary | ICD-10-CM

## 2014-03-20 DIAGNOSIS — N611 Abscess of the breast and nipple: Secondary | ICD-10-CM

## 2014-03-20 DIAGNOSIS — Z5111 Encounter for antineoplastic chemotherapy: Secondary | ICD-10-CM

## 2014-03-20 DIAGNOSIS — C50911 Malignant neoplasm of unspecified site of right female breast: Secondary | ICD-10-CM

## 2014-03-20 DIAGNOSIS — C50119 Malignant neoplasm of central portion of unspecified female breast: Secondary | ICD-10-CM

## 2014-03-20 DIAGNOSIS — K219 Gastro-esophageal reflux disease without esophagitis: Principal | ICD-10-CM

## 2014-03-20 DIAGNOSIS — C50A1 Malignant inflammatory neoplasm of right breast: Secondary | ICD-10-CM

## 2014-03-20 LAB — CBC WITH DIFFERENTIAL/PLATELET
BASO%: 1.3 % (ref 0.0–2.0)
BASOS ABS: 0.1 10*3/uL (ref 0.0–0.1)
EOS ABS: 0.1 10*3/uL (ref 0.0–0.5)
EOS%: 2.1 % (ref 0.0–7.0)
HCT: 29.6 % — ABNORMAL LOW (ref 34.8–46.6)
HGB: 9.8 g/dL — ABNORMAL LOW (ref 11.6–15.9)
LYMPH%: 39.2 % (ref 14.0–49.7)
MCH: 32.1 pg (ref 25.1–34.0)
MCHC: 33.1 g/dL (ref 31.5–36.0)
MCV: 97 fL (ref 79.5–101.0)
MONO#: 0.7 10*3/uL (ref 0.1–0.9)
MONO%: 17.1 % — AB (ref 0.0–14.0)
NEUT%: 40.3 % (ref 38.4–76.8)
NEUTROS ABS: 1.5 10*3/uL (ref 1.5–6.5)
PLATELETS: 261 10*3/uL (ref 145–400)
RBC: 3.05 10*6/uL — ABNORMAL LOW (ref 3.70–5.45)
RDW: 16.1 % — ABNORMAL HIGH (ref 11.2–14.5)
WBC: 3.8 10*3/uL — ABNORMAL LOW (ref 3.9–10.3)
lymph#: 1.5 10*3/uL (ref 0.9–3.3)

## 2014-03-20 LAB — COMPREHENSIVE METABOLIC PANEL (CC13)
ALK PHOS: 104 U/L (ref 40–150)
ALT: 15 U/L (ref 0–55)
AST: 15 U/L (ref 5–34)
Albumin: 3.6 g/dL (ref 3.5–5.0)
Anion Gap: 8 mEq/L (ref 3–11)
BILIRUBIN TOTAL: 0.24 mg/dL (ref 0.20–1.20)
BUN: 15.7 mg/dL (ref 7.0–26.0)
CO2: 25 mEq/L (ref 22–29)
CREATININE: 0.7 mg/dL (ref 0.6–1.1)
Calcium: 9.3 mg/dL (ref 8.4–10.4)
Chloride: 110 mEq/L — ABNORMAL HIGH (ref 98–109)
GLUCOSE: 101 mg/dL (ref 70–140)
Potassium: 4 mEq/L (ref 3.5–5.1)
Sodium: 143 mEq/L (ref 136–145)
Total Protein: 6.5 g/dL (ref 6.4–8.3)

## 2014-03-20 MED ORDER — DEXAMETHASONE SODIUM PHOSPHATE 20 MG/5ML IJ SOLN
INTRAMUSCULAR | Status: AC
  Filled 2014-03-20: qty 5

## 2014-03-20 MED ORDER — FIRST-DUKES MOUTHWASH MT SUSP: OROMUCOSAL | Status: DC

## 2014-03-20 MED ORDER — TRAMADOL HCL 50 MG PO TABS: 50.0000 mg | ORAL_TABLET | Freq: Four times a day (QID) | ORAL | Status: DC | PRN

## 2014-03-20 MED ORDER — ONDANSETRON 16 MG/50ML IVPB (CHCC)
16.0000 mg | Freq: Once | INTRAVENOUS | Status: AC
  Administered 2014-03-20: 16 mg via INTRAVENOUS

## 2014-03-20 MED ORDER — HEPARIN SOD (PORK) LOCK FLUSH 100 UNIT/ML IV SOLN
500.0000 [IU] | Freq: Once | INTRAVENOUS | Status: AC | PRN
  Administered 2014-03-20: 500 [IU]
  Filled 2014-03-20: qty 5

## 2014-03-20 MED ORDER — DEXAMETHASONE SODIUM PHOSPHATE 20 MG/5ML IJ SOLN
20.0000 mg | Freq: Once | INTRAMUSCULAR | Status: AC
Start: 2014-03-20 — End: 2014-03-20
  Administered 2014-03-20: 20 mg via INTRAVENOUS

## 2014-03-20 MED ORDER — PANTOPRAZOLE SODIUM 40 MG PO TBEC: 40.0000 mg | DELAYED_RELEASE_TABLET | Freq: Every day | ORAL | Status: DC

## 2014-03-20 MED ORDER — ONDANSETRON 16 MG/50ML IVPB (CHCC)
INTRAVENOUS | Status: AC
  Filled 2014-03-20: qty 16

## 2014-03-20 MED ORDER — SODIUM CHLORIDE 0.9 % IJ SOLN
10.0000 mL | INTRAMUSCULAR | Status: DC | PRN
  Administered 2014-03-20: 10 mL
  Filled 2014-03-20: qty 10

## 2014-03-20 MED ORDER — DIPHENHYDRAMINE HCL 50 MG/ML IJ SOLN
25.0000 mg | Freq: Once | INTRAMUSCULAR | Status: AC
  Administered 2014-03-20: 25 mg via INTRAVENOUS

## 2014-03-20 MED ORDER — PACLITAXEL CHEMO INJECTION 300 MG/50ML
80.0000 mg/m2 | Freq: Once | INTRAVENOUS | Status: AC
  Administered 2014-03-20: 150 mg via INTRAVENOUS
  Filled 2014-03-20: qty 25

## 2014-03-20 MED ORDER — LIDOCAINE-PRILOCAINE 2.5-2.5 % EX CREA: TOPICAL_CREAM | Freq: Once | CUTANEOUS | Status: DC

## 2014-03-20 MED ORDER — FAMOTIDINE IN NACL 20-0.9 MG/50ML-% IV SOLN
20.0000 mg | Freq: Once | INTRAVENOUS | Status: AC
  Administered 2014-03-20: 20 mg via INTRAVENOUS

## 2014-03-20 MED ORDER — FAMOTIDINE IN NACL 20-0.9 MG/50ML-% IV SOLN
INTRAVENOUS | Status: AC
  Filled 2014-03-20: qty 50

## 2014-03-20 MED ORDER — SODIUM CHLORIDE 0.9 % IV SOLN
Freq: Once | INTRAVENOUS | Status: AC
  Administered 2014-03-20: 15:00:00 via INTRAVENOUS

## 2014-03-20 MED ORDER — DIPHENHYDRAMINE HCL 50 MG/ML IJ SOLN
INTRAMUSCULAR | Status: AC
  Filled 2014-03-20: qty 1

## 2014-03-20 MED ORDER — INSULIN GLARGINE 100 UNIT/ML SOLOSTAR PEN: 10.0000 [IU] | PEN_INJECTOR | Freq: Every day | SUBCUTANEOUS | Status: AC

## 2014-03-20 MED ORDER — CARBOPLATIN CHEMO INJECTION 450 MG/45ML
229.0000 mg | Freq: Once | INTRAVENOUS | Status: AC
  Administered 2014-03-20: 230 mg via INTRAVENOUS
  Filled 2014-03-20: qty 23

## 2014-03-20 NOTE — Telephone Encounter (Signed)
Per staff message and POF I have scheduled appts. Advised scheduler of appts. [name]  

## 2014-03-20 NOTE — Telephone Encounter (Signed)
Pt confirmed labs/ov per 10/05 POF, sent msg to add chemo, gave pt AVS..... KJ

## 2014-03-20 NOTE — Progress Notes (Signed)
[place]  Telephone:(336) [phone] Fax:(336) [phone]  Clinic Follow up Note 03/20/2014    ID: Carmen Stone OB: [birth year]  MR#: [id number]  XIH#:[id number]  PCP: [name], MD SU: [name] MD  CHIEF COMPLAINT: Triple negative breast cancer, locally advanced   CURRENT TREATMENT: Adjuvant chemotherapy. Patient on Carboplatin and Taxol weekly regimen. Week 7 or cycle 7 held today due to low white count.   BREAST CANCER HISTORY: As per previously dictated Dr.Magrinat's note:  Ms. Berthold presented to the emergency room on 10/22/2013 complaining of right breast pain and discharge. On exam the breast was diffusely erythematous with" orange peel" appearance. She was admitted for debridement and surgery he describes a skin defect in the lower inner quadrant measuring approximately 4 cm. When the breast was breast, air came out through the hole--there are photos included with the admission note. A chest x-ray was obtained which showed the right breast to be asymmetrically enlarged with extensive subcutaneous gas.  On the same day the patient underwent debridement of the wound, with the pathology (SZA 15-2010) showing high-grade carcinoma involving ulcerated skin. There was tumor within the dermal and subcutaneous lymphatics. The tumor was negative for estrogen, progesterone, and HER-2(signals ratio 1.24, number per cell 3.6), with an MIB-1 of 95%. Gross cystic disease fluid protein was also negative.  The patient was admitted and started on antibiotics. On 10/24/2013 an attempt was made at further debridement, but the inside of the breast essentially came out in hand falls. The pathology from this procedure(SZA 15-20 and 26) again showed high-grade carcinoma.  On 10/25/2013 the patient underwent CT scans of the chest abdomen and pelvis. Aside from disease in the breast and right axilla there was no evidence of metastatic disease.     Accordingly Dr.  [name] proceeded to right mastectomy with axillary lymph node dissection 10/26/2013. Results of this procedure (SZA 15-2093) showed a 15 cm defect in the middle of the breast, which is 4 prior necrotic tissue had been scoped out. There was nevertheless extensive invasive ductal carcinoma, grade 3, in the specimen and repeat or blastic panel was again triple negative. 2 of 16 axillary lymph nodes were involved by macrometastatic deposits. The patient was continued on antibiotics and eventually discharged 11/03/2013.  Initial chemotherapy with dose dense doxorubicin and cyclophosphamide x 4 cycles completed 01/09/2014. Now plan is to give carboplatin and paclitaxel weekly x12 doses, today is week 6, and postmastectomy radiation to follow after completion of chemotherapy.  INTERVAL HISTORY:  Carmen Stone returns today for followup of her breast cancer on chemotherapy. She has completed chemotherapy with cyclophosphamide and doxorubicin and received the fourth and final cycle. She got 6 cycles of Carboplatin and Taxol. Patient is doing well. She has alopecia complete now from [facility] chemo. She now gets neupogen shots for 2 days after each week of carboplatin and taxol. Her nails have started to come off. Here is a picture below from 2 weeks ago. She wears a glove on her right hand which is more affected than the left. She applies cream on it as well.       REVIEW OF SYSTEMS: Carmen Stone did remarkably well with the first part of her chemotherapy. She never had nausea or vomiting problems. She was fatigued but now recovered. No chest pain, palpitations, SOB, ankle edema, or GI symptoms. She does not like Aleve, which upsets her stomach, and Tylenol is not sufficient. She does not get constipated from the tramadol and she got a script today for  it 30 pills.she asked for protonix and magic mouth wash today and a refill on emal cream which were all e-scripted to pharmacy. Aside from this a detailed review of systems  today was negative.   PAST MEDICAL HISTORY: Past Medical History  Diagnosis Date  . Diabetes mellitus, type II   . Inflammatory breast cancer     PAST SURGICAL HISTORY:q2`  Past Surgical History  Procedure Laterality Date  . Cesarean section    . Ventral hernia repair  2011    repair incacerated VH with biologic mesh; cholecystectomy  . Cholecystectomy  2011  . Incision and drainage of wound Right 10/22/2013    Procedure: IRRIGATION AND DRAINAGE AND DEBRIDEMENT RIGHT BREAST WOUND;  Surgeon: [name], MD;  Location: [place];  Service: General;  Laterality: Right;  . Breast biopsy Right 10/22/2013    Procedure: BREAST BIOPSY;  Surgeon: [name], MD;  Location: [place];  Service: General;  Laterality: Right;  . Irrigation and debridement abscess Right 10/24/2013    Procedure: Right total mastectomy;  Surgeon: [name], MD;  Location: [place];  Service: General;  Laterality: Right;  . Dressing change under anesthesia Right 10/26/2013    Procedure: COMPLETION MASTECTOMY, AXILLARY DISSECTION;  Surgeon: [name], MD;  Location: [place];  Service: General;  Laterality: Right;  . Portacath placement N/A 11/16/2013    Procedure: INSERTION PORT-A-CATH;  Surgeon: [name], MD;  Location: [place];  Service: General;  Laterality: N/A;    FAMILY HISTORY No family history on file. The patient's father was a heavy smoker and died from complications of emphysema at the age of 6. The patient's mother died from lung cancer at the age of 72. She also was a heavy smoker. The patient had one brother, no sisters. There is no history of breast or ovarian cancer in the family.  GYNECOLOGIC HISTORY:  Menarche age 32, first live birth age 22, the patient is GX P1. She went through the change of life approximately age 4. She did not take hormone replacement.  SOCIAL HISTORY:  Carmen Stone is divorced. She works for [employer] and also works a second job at [employer] on  [employer]. She plans to be on disability during her chemoradiation treatments. She lives alone, but her daughter Carmen Stone frequently stays there overnight. 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 She spends her time between helping her mom and helping her grandfather, Carmen Stone, who lives in [place] and has significant dementia. Mr. Zoeller was present at the 11/18/2013 visit only because Carmen Stone wanted to be present and otherwise no one would have been with Mr. Costin. Avielle has no grandchildren. She attends a Nurse, adult church.   ADVANCED DIRECTIVES: In place. The patient has named her brother Clide Cliff as her healthcare power of attorney. Iona Beard can be reached at [phone]. He is a Pharmacist, hospital locally  HEALTH MAINTENANCE: History  Substance Use Topics  . Smoking status: Never Smoker   . Smokeless tobacco: Not on file  . Alcohol Use: No     Colonoscopy:  PAP:  Bone density:  Lipid panel:  No Known Allergies  Current Outpatient Prescriptions  Medication Sig Dispense Refill  . acetaminophen (TYLENOL) 325 MG tablet Take 650 mg by mouth every 6 (six) hours as needed for mild pain.      Marland Kitchen dexamethasone (DECADRON) 4 MG tablet Take 2 tablets by mouth once a day on the day after chemotherapy and then  take 2 tablets two times a day for 2 days. Take with food.  30 tablet  1  . Diphenhyd-Hydrocort-Nystatin (FIRST-DUKES MOUTHWASH) SUSP Swish and spit 5 mls by mouth every 6 hours for mouth sores and use before each meal.  120 mL  1  . glucose blood test strip Use as instructed  100 each  12  . glucose monitoring kit (FREESTYLE) monitoring kit 1 each by Does not apply route as needed for other. Check blood sugars daily.  1 each  0  . Insulin Glargine (LANTUS SOLOSTAR) 100 UNIT/ML Solostar Pen Inject 10 Units into the skin daily at 10 pm.  15 mL  3  . linagliptin (TRADJENTA) 5 MG TABS tablet Take 1 tablet (5 mg total) by mouth daily.  30 tablet   0  . LORazepam (ATIVAN) 0.5 MG tablet Take 1 tablet (0.5 mg total) by mouth at bedtime as needed (Nausea or vomiting).  30 tablet  0  . ondansetron (ZOFRAN) 8 MG tablet Take 1 tablet (8 mg total) by mouth every 8 (eight) hours as needed for nausea or vomiting.  20 tablet  3  . pantoprazole (PROTONIX) 40 MG tablet Take 1 tablet (40 mg total) by mouth daily at 6 (six) AM.  30 tablet  3  . traMADol (ULTRAM) 50 MG tablet Take 1 tablet (50 mg total) by mouth every 6 (six) hours as needed.  30 tablet  0   Current Facility-Administered Medications  Medication Dose Route Frequency Provider Last Rate Last Dose  . lidocaine-prilocaine (EMLA) cream   Topical Once Bralyn Espino Marla Roe, MD        OBJECTIVE: Middle-aged African American woman who appears stated age 58 Vitals:   03/20/14 1243  BP: 137/59  Pulse: 96  Temp: 98.5 F (36.9 C)  Resp: 18     Body mass index is 28.87 kg/(m^2).    ECOG FS:1  Sclerae unicteric, pupils equal and reactive Oropharynx clear and moist, no thrush or mucositis No cervical or supraclavicular adenopathy Lungs no rales or rhonchi Heart regular rate and rhythm Abd soft, nontender, positive bowel sounds MSK no focal spinal tenderness, no upper extremity lymphedema, nails breaking. pic above Neuro: nonfocal, well oriented, appropriate affect Breasts: Deferred   RADIOLOGY DATA   CT chest, abdomen and pelvis 10/26/2013:   1. Postoperative changes of recent right breast surgery with packed open wound which has a thick rind of soft tissue, some of which  enhances, a 1.3 x 1.5 cm enhancing lesion in the medial aspect of the right breast which may represent residual tumor or a malignant  lymph node, and multiple enlarged right axillary nodes concerning for axillary nodal metastases.  2. No other definite signs of metastatic disease in the chest, abdomen or pelvis.  3. Multiple small ventral hernias containing several loops of small bowel, as well as the mid  transverse colon, without associated bowel  incarceration or obstruction at this time.  4. Trace bilateral pleural effusions with mild dependent subsegmental atelectasis in the lower lobes of the lungs bilaterally.  5. Status post cholecystectomy and hysterectomy.      LABS: Reviewed.        ASSESSMENT/PLAN: 58 y.o. [place] woman  #1. status post right modified radical mastectomy 10/26/2013 for a cT4, pT3 pN1, stage IIIB invasive ductal carcinoma, grade 3, triple negative, with an MIB-1 of 95% . #2. Initial chemotherapy with dose dense doxorubicin and cyclophosphamide x 4 cycles completed 01/09/2014  #3. carboplatin and paclitaxel weekly x12 doses, started  on 01/23/2014. With second week of therapy, she had a mild reaction to Taxol which was adequately treated. Today her 7th week of Taxol is planned. She will get 2 doses of neupogen for white count recovery.  I did a physical exam and checked her labs. She has moderate anemia related to chemotherapy. She started taking iron daily. No transfusion unless hemoglobin is below 8 grams.  #4  postmastectomy radiation to follow after completion of chemotherapy.  #5 We will continue to monitor her labs weekly and do a toxicity screening and a visit with a provider. We gave a script for Tramadol today. Also e-scripted Protonix, Magic mouthwash, emla cream.  #6 RTC next week for week 8 of carboplatin and taxol.     Bernadene Bell, MD Medical Hematologist/Oncologist [place] Pager: [phone] Office No: [phone]

## 2014-03-20 NOTE — Patient Instructions (Signed)
[facility] Discharge Instructions for Patients Receiving Chemotherapy  Today you received the following chemotherapy agents:  Taxol and Carboplatin  To help prevent nausea and vomiting after your treatment, we encourage you to take your nausea medication as ordered per MD.   If you develop nausea and vomiting that is not controlled by your nausea medication, call the clinic.   BELOW ARE SYMPTOMS THAT SHOULD BE REPORTED IMMEDIATELY:  *FEVER GREATER THAN 100.5 F  *CHILLS WITH OR WITHOUT FEVER  NAUSEA AND VOMITING THAT IS NOT CONTROLLED WITH YOUR NAUSEA MEDICATION  *UNUSUAL SHORTNESS OF BREATH  *UNUSUAL BRUISING OR BLEEDING  TENDERNESS IN MOUTH AND THROAT WITH OR WITHOUT PRESENCE OF ULCERS  *URINARY PROBLEMS  *BOWEL PROBLEMS  UNUSUAL RASH Items with * indicate a potential emergency and should be followed up as soon as possible.  Feel free to call the clinic you have any questions or concerns. The clinic phone number is [phone].    

## 2014-03-21 ENCOUNTER — Ambulatory Visit (HOSPITAL_BASED_OUTPATIENT_CLINIC_OR_DEPARTMENT_OTHER): Payer: BC Managed Care – PPO

## 2014-03-21 VITALS — BP 141/67 | HR 96 | Temp 98.3°F

## 2014-03-21 DIAGNOSIS — C50919 Malignant neoplasm of unspecified site of unspecified female breast: Secondary | ICD-10-CM

## 2014-03-21 DIAGNOSIS — Z08 Encounter for follow-up examination after completed treatment for malignant neoplasm: Secondary | ICD-10-CM

## 2014-03-21 MED ORDER — TBO-FILGRASTIM 300 MCG/0.5ML ~~LOC~~ SOSY
300.0000 ug | PREFILLED_SYRINGE | Freq: Once | SUBCUTANEOUS | Status: AC
  Administered 2014-03-21: 300 ug via SUBCUTANEOUS
  Filled 2014-03-21: qty 0.5

## 2014-03-22 ENCOUNTER — Ambulatory Visit (HOSPITAL_BASED_OUTPATIENT_CLINIC_OR_DEPARTMENT_OTHER): Payer: BC Managed Care – PPO

## 2014-03-22 VITALS — BP 124/62 | HR 94 | Temp 98.2°F

## 2014-03-22 DIAGNOSIS — Z08 Encounter for follow-up examination after completed treatment for malignant neoplasm: Secondary | ICD-10-CM

## 2014-03-22 DIAGNOSIS — C50919 Malignant neoplasm of unspecified site of unspecified female breast: Secondary | ICD-10-CM

## 2014-03-22 MED ORDER — TBO-FILGRASTIM 300 MCG/0.5ML ~~LOC~~ SOSY
300.0000 ug | PREFILLED_SYRINGE | Freq: Once | SUBCUTANEOUS | Status: AC
  Administered 2014-03-22: 300 ug via SUBCUTANEOUS
  Filled 2014-03-22: qty 0.5

## 2014-03-23 ENCOUNTER — Encounter: Payer: Self-pay | Admitting: *Deleted

## 2014-03-23 NOTE — Progress Notes (Signed)
[facility]  Clinical Social Work was referred by patient for assessment of psychosocial needs due to request to complete ADRs. Pt was to come last week to complete, but then forgot. Pt is here on Monday, October 12 for infusion and would like to complete ADRs with CSW in the infusion room. She reports to have packet, just needs to sign. CSW to meet in the infusion room to complete with Johnnye Lana.   Clinical Social Work interventions: ADR education  Loren Racer, Sylva Worker [place]  [place] Phone: [phone] Fax: [phone]

## 2014-03-27 ENCOUNTER — Telehealth: Payer: Self-pay | Admitting: Hematology

## 2014-03-27 ENCOUNTER — Ambulatory Visit (HOSPITAL_BASED_OUTPATIENT_CLINIC_OR_DEPARTMENT_OTHER): Payer: BC Managed Care – PPO | Admitting: Physician Assistant

## 2014-03-27 ENCOUNTER — Encounter: Payer: Self-pay | Admitting: Physician Assistant

## 2014-03-27 ENCOUNTER — Other Ambulatory Visit (HOSPITAL_BASED_OUTPATIENT_CLINIC_OR_DEPARTMENT_OTHER): Payer: BC Managed Care – PPO

## 2014-03-27 VITALS — BP 167/62 | HR 98 | Temp 98.7°F | Resp 18 | Ht 64.0 in | Wt 165.4 lb

## 2014-03-27 DIAGNOSIS — R197 Diarrhea, unspecified: Secondary | ICD-10-CM

## 2014-03-27 DIAGNOSIS — C50919 Malignant neoplasm of unspecified site of unspecified female breast: Secondary | ICD-10-CM

## 2014-03-27 DIAGNOSIS — Z171 Estrogen receptor negative status [ER-]: Secondary | ICD-10-CM

## 2014-03-27 DIAGNOSIS — N611 Abscess of the breast and nipple: Secondary | ICD-10-CM

## 2014-03-27 DIAGNOSIS — C50911 Malignant neoplasm of unspecified site of right female breast: Secondary | ICD-10-CM

## 2014-03-27 LAB — CBC WITH DIFFERENTIAL/PLATELET
BASO%: 1 % (ref 0.0–2.0)
BASOS ABS: 0.1 10*3/uL (ref 0.0–0.1)
EOS%: 1.6 % (ref 0.0–7.0)
Eosinophils Absolute: 0.1 10*3/uL (ref 0.0–0.5)
HCT: 30.2 % — ABNORMAL LOW (ref 34.8–46.6)
HEMOGLOBIN: 10.1 g/dL — AB (ref 11.6–15.9)
LYMPH#: 1.5 10*3/uL (ref 0.9–3.3)
LYMPH%: 23.5 % (ref 14.0–49.7)
MCH: 32.4 pg (ref 25.1–34.0)
MCHC: 33.3 g/dL (ref 31.5–36.0)
MCV: 97.3 fL (ref 79.5–101.0)
MONO#: 0.9 10*3/uL (ref 0.1–0.9)
MONO%: 13.8 % (ref 0.0–14.0)
NEUT#: 3.7 10*3/uL (ref 1.5–6.5)
NEUT%: 60.1 % (ref 38.4–76.8)
PLATELETS: 261 10*3/uL (ref 145–400)
RBC: 3.11 10*6/uL — ABNORMAL LOW (ref 3.70–5.45)
RDW: 16.4 % — ABNORMAL HIGH (ref 11.2–14.5)
WBC: 6.2 10*3/uL (ref 3.9–10.3)

## 2014-03-27 LAB — COMPREHENSIVE METABOLIC PANEL (CC13)
ALT: 14 U/L (ref 0–55)
ANION GAP: 9 meq/L (ref 3–11)
AST: 13 U/L (ref 5–34)
Albumin: 3.5 g/dL (ref 3.5–5.0)
Alkaline Phosphatase: 97 U/L (ref 40–150)
BILIRUBIN TOTAL: 0.29 mg/dL (ref 0.20–1.20)
BUN: 9.3 mg/dL (ref 7.0–26.0)
CHLORIDE: 107 meq/L (ref 98–109)
CO2: 23 meq/L (ref 22–29)
CREATININE: 0.7 mg/dL (ref 0.6–1.1)
Calcium: 9.1 mg/dL (ref 8.4–10.4)
Glucose: 200 mg/dl — ABNORMAL HIGH (ref 70–140)
Potassium: 3.6 mEq/L (ref 3.5–5.1)
Sodium: 139 mEq/L (ref 136–145)
Total Protein: 6.4 g/dL (ref 6.4–8.3)

## 2014-03-27 MED ORDER — TRAMADOL HCL 50 MG PO TABS
50.0000 mg | ORAL_TABLET | Freq: Four times a day (QID) | ORAL | Status: DC | PRN
Start: 2014-03-27 — End: 2014-04-03

## 2014-03-27 NOTE — Patient Instructions (Signed)
Call your insurance company regarding coverage for your Lantus insulin Followup in one week when you resume your weekly chemotherapy Take Imodium if needed for your diarrhea.

## 2014-03-27 NOTE — Progress Notes (Deleted)
[place]  Telephone:([phone]) [phone] Fax:(336) [phone]  Clinic Follow up Note 03/20/2014    ID: Carmen Stone OB: [birth year]  MR#: [id number]  XIP#:[id number]  PCP: [name], MD SU: [name] MD  CHIEF COMPLAINT: Triple negative breast cancer, locally advanced   CURRENT TREATMENT: Adjuvant chemotherapy. Patient on Carboplatin and Taxol weekly regimen. Week 7 or cycle 7 held today due to low white count.   BREAST CANCER HISTORY: As per previously dictated Dr.Magrinat's note:  Carmen Stone presented to the emergency room on 10/22/2013 complaining of right breast pain and discharge. On exam the breast was diffusely erythematous with" orange peel" appearance. She was admitted for debridement and surgery he describes a skin defect in the lower inner quadrant measuring approximately 4 cm. When the breast was breast, air came out through the hole--there are photos included with the admission note. A chest x-ray was obtained which showed the right breast to be asymmetrically enlarged with extensive subcutaneous gas.  On the same day the patient underwent debridement of the wound, with the pathology (SZA 15-2010) showing high-grade carcinoma involving ulcerated skin. There was tumor within the dermal and subcutaneous lymphatics. The tumor was negative for estrogen, progesterone, and HER-2(signals ratio 1.24, number per cell 3.6), with an MIB-1 of 95%. Gross cystic disease fluid protein was also negative.  The patient was admitted and started on antibiotics. On 10/24/2013 an attempt was made at further debridement, but the inside of the breast essentially came out in hand falls. The pathology from this procedure(SZA 15-20 and 26) again showed high-grade carcinoma.  On 10/25/2013 the patient underwent CT scans of the chest abdomen and pelvis. Aside from disease in the breast and right axilla there was no evidence of metastatic disease.     Accordingly Dr.  [name] proceeded to right mastectomy with axillary lymph node dissection 10/26/2013. Results of this procedure (SZA [id number]) showed a 15 cm defect in the middle of the breast, which is 4 prior necrotic tissue had been scoped out. There was nevertheless extensive invasive ductal carcinoma, grade 3, in the specimen and repeat or blastic panel was again triple negative. 2 of 16 axillary lymph nodes were involved by macrometastatic deposits. The patient was continued on antibiotics and eventually discharged 11/03/2013.  Initial chemotherapy with dose dense doxorubicin and cyclophosphamide x 4 cycles completed 01/09/2014. Now plan is to give carboplatin and paclitaxel weekly x12 doses, today is week 6, and postmastectomy radiation to follow after completion of chemotherapy.  INTERVAL HISTORY:  Carmen Stone returns today for followup of her breast cancer on chemotherapy. She has completed chemotherapy with cyclophosphamide and doxorubicin and received the fourth and final cycle. She got 6 cycles of Carboplatin and Taxol. Patient is doing well. She has alopecia complete now from [facility] chemo. She now gets neupogen shots for 2 days after each week of carboplatin and taxol. Her nails have started to come off. Here is a picture below from 2 weeks ago. She wears a glove on her right hand which is more affected than the left. She applies cream on it as well.       REVIEW OF SYSTEMS: Carmen Stone did remarkably well with the first part of her chemotherapy. She never had nausea or vomiting problems. She was fatigued but now recovered. No chest pain, palpitations, SOB, ankle edema, or GI symptoms. She does not like Aleve, which upsets her stomach, and Tylenol is not sufficient. She does not get constipated from the tramadol and she got a script today for  it 30 pills.she asked for protonix and magic mouth wash today and a refill on emal cream which were all e-scripted to pharmacy. Aside from this a detailed review of systems  today was negative.   PAST MEDICAL HISTORY: Past Medical History  Diagnosis Date  . Diabetes mellitus, type II   . Inflammatory breast cancer     PAST SURGICAL HISTORY:q2`  Past Surgical History  Procedure Laterality Date  . Cesarean section    . Ventral hernia repair  2011    repair incacerated VH with biologic mesh; cholecystectomy  . Cholecystectomy  2011  . Incision and drainage of wound Right 10/22/2013    Procedure: IRRIGATION AND DRAINAGE AND DEBRIDEMENT RIGHT BREAST WOUND;  Surgeon: [name], MD;  Location: [place];  Service: General;  Laterality: Right;  . Breast biopsy Right 10/22/2013    Procedure: BREAST BIOPSY;  Surgeon: [name], MD;  Location: [place];  Service: General;  Laterality: Right;  . Irrigation and debridement abscess Right 10/24/2013    Procedure: Right total mastectomy;  Surgeon: [name], MD;  Location: [place];  Service: General;  Laterality: Right;  . Dressing change under anesthesia Right 10/26/2013    Procedure: COMPLETION MASTECTOMY, AXILLARY DISSECTION;  Surgeon: [name], MD;  Location: [place];  Service: General;  Laterality: Right;  . Portacath placement N/A 11/16/2013    Procedure: INSERTION PORT-A-CATH;  Surgeon: [name], MD;  Location: [place];  Service: General;  Laterality: N/A;    FAMILY HISTORY History reviewed. No pertinent family history. The patient's father was a heavy smoker and died from complications of emphysema at the age of 31. The patient's mother died from lung cancer at the age of 59. She also was a heavy smoker. The patient had one brother, no sisters. There is no history of breast or ovarian cancer in the family.  GYNECOLOGIC HISTORY:  Menarche age 7, first live birth age 57, the patient is GX P1. She went through the change of life approximately age 32. She did not take hormone replacement.  SOCIAL HISTORY:  Carmen Stone is divorced. She works for [employer] and also works a  second job at [employer] on [employer]. She plans to be on disability during her chemoradiation treatments. She lives alone, but her daughter Carmen Stone frequently stays there overnight. 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 She spends her time between helping her mom and helping her grandfather, Carmen Stone, who lives in [place] and has significant dementia. Mr. Rief was present at the 11/18/2013 visit only because Carmen Stone wanted to be present and otherwise no one would have been with Mr. Sollars. Adella has no grandchildren. She attends a [employer] adult church.   ADVANCED DIRECTIVES: In place. The patient has named her brother Carmen Stone as her healthcare power of attorney. Carmen Stone can be reached at [phone]. He is a Pharmacist, hospital locally  HEALTH MAINTENANCE: History  Substance Use Topics  . Smoking status: Never Smoker   . Smokeless tobacco: Not on file  . Alcohol Use: No     Colonoscopy:  PAP:  Bone density:  Lipid panel:  No Known Allergies  Current Outpatient Prescriptions  Medication Sig Dispense Refill  . acetaminophen (TYLENOL) 325 MG tablet Take 650 mg by mouth every 6 (six) hours as needed for mild pain.      Marland Kitchen dexamethasone (DECADRON) 4 MG tablet Take 2 tablets by mouth once a day on the day after chemotherapy and  then take 2 tablets two times a day for 2 days. Take with food.  30 tablet  1  . Diphenhyd-Hydrocort-Nystatin (FIRST-DUKES MOUTHWASH) SUSP Swish and spit 5 mls by mouth every 6 hours for mouth sores and use before each meal.  120 mL  1  . glucose blood test strip Use as instructed  100 each  12  . glucose monitoring kit (FREESTYLE) monitoring kit 1 each by Does not apply route as needed for other. Check blood sugars daily.  1 each  0  . Insulin Glargine (LANTUS SOLOSTAR) 100 UNIT/ML Solostar Pen Inject 10 Units into the skin daily at 10 pm.  15 mL  3  . pantoprazole (PROTONIX) 40 MG tablet Take 1 tablet (40 mg total) by  mouth daily at 6 (six) AM.  30 tablet  3  . linagliptin (TRADJENTA) 5 MG TABS tablet Take 1 tablet (5 mg total) by mouth daily.  30 tablet  0  . LORazepam (ATIVAN) 0.5 MG tablet Take 1 tablet (0.5 mg total) by mouth at bedtime as needed (Nausea or vomiting).  30 tablet  0  . ondansetron (ZOFRAN) 8 MG tablet Take 1 tablet (8 mg total) by mouth every 8 (eight) hours as needed for nausea or vomiting.  20 tablet  3  . traMADol (ULTRAM) 50 MG tablet Take 1 tablet (50 mg total) by mouth every 6 (six) hours as needed.  30 tablet  0   No current facility-administered medications for this visit.    OBJECTIVE: Middle-aged [place] American woman who appears stated age 30 Vitals:   03/27/14 1352  BP: 167/62  Pulse: 98  Temp: 98.7 F (37.1 C)  Resp: 18     Body mass index is 28.38 kg/(m^2).    ECOG FS:1  Sclerae unicteric, pupils equal and reactive Oropharynx clear and moist, no thrush or mucositis No cervical or supraclavicular adenopathy Lungs no rales or rhonchi Heart regular rate and rhythm Abd soft, nontender, positive bowel sounds MSK no focal spinal tenderness, no upper extremity lymphedema, nails breaking. pic above Neuro: nonfocal, well oriented, appropriate affect Breasts: Deferred   RADIOLOGY DATA   CT chest, abdomen and pelvis 10/26/2013:   1. Postoperative changes of recent right breast surgery with packed open wound which has a thick rind of soft tissue, some of which  enhances, a 1.3 x 1.5 cm enhancing lesion in the medial aspect of the right breast which may represent residual tumor or a malignant  lymph node, and multiple enlarged right axillary nodes concerning for axillary nodal metastases.  2. No other definite signs of metastatic disease in the chest, abdomen or pelvis.  3. Multiple small ventral hernias containing several loops of small bowel, as well as the mid transverse colon, without associated bowel  incarceration or obstruction at this time.  4. Trace  bilateral pleural effusions with mild dependent subsegmental atelectasis in the lower lobes of the lungs bilaterally.  5. Status post cholecystectomy and hysterectomy.      LABS: Reviewed.        ASSESSMENT/PLAN: 58 y.o. [place] woman  #1. status post right modified radical mastectomy 10/26/2013 for a cT4, pT3 pN1, stage IIIB invasive ductal carcinoma, grade 3, triple negative, with an MIB-1 of 95% . #2. Initial chemotherapy with dose dense doxorubicin and cyclophosphamide x 4 cycles completed 01/09/2014  #3. carboplatin and paclitaxel weekly x12 doses, started on 01/23/2014. With second week of therapy, she had a mild reaction to Taxol which was adequately treated. Today her 7th week  of Taxol is planned. She will get 2 doses of neupogen for white count recovery.  I did a physical exam and checked her labs. She has moderate anemia related to chemotherapy. She started taking iron daily. No transfusion unless hemoglobin is below 8 grams.  #4  postmastectomy radiation to follow after completion of chemotherapy.  #5 We will continue to monitor her labs weekly and do a toxicity screening and a visit with a provider. We gave a script for Tramadol today. Also e-scripted Protonix, Magic mouthwash, emla cream.  #6 RTC next week for week 8 of carboplatin and taxol.     Bernadene Bell, MD Medical Hematologist/Oncologist [place] Pager: [phone] Office No: [phone]

## 2014-03-27 NOTE — Progress Notes (Signed)
Hematology and Oncology Follow Up Visit  Carmen Stone [id number] 08/06/1955 58 y.o. 03/27/2014 4:57 PM  Principle Diagnosis: cT4, pT3 pN1, stage IIIB invasive ductal carcinoma, grade 3, triple negative, with an MIB-1 of 95%  Prior Therapy:  1. Status post right modified radical mastectomy on 10/26/2013 2. Status post 4 cycles of dose dense doxorubicin cyclophosphamide completed 01/09/2014  Current therapy: carboplatin and paclitaxel weekly x12 doses, started on 01/23/2014  BREAST CANCER HISTORY: As per previously dictated Dr.Magrinat's note:  Carmen Stone presented to the emergency room on 10/22/2013 complaining of right breast pain and discharge. On exam the breast was diffusely erythematous with" orange peel" appearance. She was admitted for debridement and surgery he describes a skin defect in the lower inner quadrant measuring approximately 4 cm. When the breast was breast, air came out through the hole--there are photos included with the admission note. A chest x-ray was obtained which showed the right breast to be asymmetrically enlarged with extensive subcutaneous gas.  On the same day the patient underwent debridement of the wound, with the pathology (SZA 15-2010) showing high-grade carcinoma involving ulcerated skin. There was tumor within the dermal and subcutaneous lymphatics. The tumor was negative for estrogen, progesterone, and HER-2(signals ratio 1.24, number per cell 3.6), with an MIB-1 of 95%. Gross cystic disease fluid protein was also negative.  The patient was admitted and started on antibiotics. On 10/24/2013 an attempt was made at further debridement, but the inside of the breast essentially came out in hand falls. The pathology from this procedure(SZA 15-20 and 26) again showed high-grade carcinoma.  On 10/25/2013 the patient underwent CT scans of the chest abdomen and pelvis. Aside from disease in the breast and right axilla there was no evidence of metastatic  disease.  Accordingly Dr. [name] proceeded to right mastectomy with axillary lymph node dissection 10/26/2013. Results of this procedure (SZA 15-2093) showed a 15 cm defect in the middle of the breast, which is 4 prior necrotic tissue had been scoped out. There was nevertheless extensive invasive ductal carcinoma, grade 3, in the specimen and repeat or blastic panel was again triple negative. 2 of 16 axillary lymph nodes were involved by macrometastatic deposits. The patient was continued on antibiotics and eventually discharged 11/03/2013.  Initial chemotherapy with dose dense doxorubicin and cyclophosphamide x 4 cycles completed 01/09/2014. Now plan is to give carboplatin and paclitaxel weekly x12 doses, today is week 6, and postmastectomy radiation to follow after completion of chemotherapy.   Interim History:  Carmen Stone presents for a scheduled symptom management visit. Her weekly chemotherapy was held due to neutropenia. She presents for reevaluation today. She reports developing diarrhea last night and this morning. She reports several episodes but denied any blood. She thinks it may be related to something questionable that she ate in her refrigerator. She denied fever chills. She has not tried any Imodium. She reports having difficulty getting her Lantus insulin filled as she is been required to pay $360 co-pay. She does recall this being the issue when she got this medication fill before. She requests a refill for tramadol.  Medications: I have reviewed the patient's current medications.  Allergies: No Known Allergies  Past Medical History, Surgical history, Social history, and Family History were reviewed and updated.  Review of Systems: Review of Systems  Constitutional: Negative for fever, chills, weight loss, malaise/fatigue and diaphoresis.  HENT: Negative for congestion, ear discharge, ear pain, hearing loss, nosebleeds, sore throat and tinnitus.   Eyes: Negative for blurred  vision, double vision, photophobia, pain, discharge and redness.  Respiratory: Negative for cough, hemoptysis, sputum production, shortness of breath, wheezing and stridor.   Cardiovascular: Negative for chest pain, palpitations, orthopnea, claudication, leg swelling and PND.  Gastrointestinal: Positive for diarrhea. Negative for heartburn, nausea, vomiting, abdominal pain, constipation, blood in stool and melena.  Genitourinary: Negative.   Musculoskeletal: Negative.   Skin: Negative.   Neurological: Negative for dizziness, tingling, focal weakness, seizures, weakness and headaches.  Endo/Heme/Allergies: Does not bruise/bleed easily.  Psychiatric/Behavioral: Negative for depression. The patient is not nervous/anxious and does not have insomnia.     Remaining ROS negative.  Physical Exam: Blood pressure 167/62, pulse 98, temperature 98.7 F (37.1 C), temperature source Oral, resp. rate 18, height '5\' 4"'  (1.626 m), weight 165 lb 6.4 oz (75.025 kg), SpO2 100.00%. ECOG:  Physical Exam  Constitutional: She is oriented to person, place, and time and well-developed, well-nourished, and in no distress.  HENT:  Head: Normocephalic and atraumatic.  Mouth/Throat: Oropharynx is clear and moist.  Eyes: Pupils are equal, round, and reactive to light.  Neck: Normal range of motion. Neck supple. No JVD present. No tracheal deviation present. No thyromegaly present.  Cardiovascular: Normal rate, regular rhythm, normal heart sounds and intact distal pulses.  Exam reveals no gallop and no friction rub.   No murmur heard. Pulmonary/Chest: Effort normal and breath sounds normal. No respiratory distress. She has no wheezes. She has no rales.  Abdominal: Soft. Bowel sounds are normal. She exhibits no distension and no mass. There is no tenderness.  Musculoskeletal: Normal range of motion. She exhibits no edema and no tenderness.  Lymphadenopathy:    She has no cervical adenopathy.  Neurological: She is  alert and oriented to person, place, and time. She has normal reflexes. Gait normal.  Skin: Skin is warm and dry. No rash noted.      Lab Results: Lab Results  Component Value Date   WBC 6.2 03/27/2014   HGB 10.1* 03/27/2014   HCT 30.2* 03/27/2014   MCV 97.3 03/27/2014   PLT 261 03/27/2014     Chemistry      Component Value Date/Time   NA 139 03/27/2014 1251   NA 141 01/23/2014 0931   K 3.6 03/27/2014 1251   K 4.4 01/23/2014 0931   CL 103 01/23/2014 0931   CO2 23 03/27/2014 1251   CO2 24 01/23/2014 0931   BUN 9.3 03/27/2014 1251   BUN 12 01/23/2014 0931   CREATININE 0.7 03/27/2014 1251   CREATININE 0.70 01/23/2014 0931      Component Value Date/Time   CALCIUM 9.1 03/27/2014 1251   CALCIUM 9.2 01/23/2014 0931   ALKPHOS 97 03/27/2014 1251   ALKPHOS 88 01/23/2014 0931   AST 13 03/27/2014 1251   AST 12 01/23/2014 0931   ALT 14 03/27/2014 1251   ALT 18 01/23/2014 0931   BILITOT 0.29 03/27/2014 1251   BILITOT <0.2* 01/23/2014 0931       Radiological Studies: No results found.   Impression and Plan:  Patient is a pleasant 58 year old African American female with triple negative right breast cancer, status post right modified radical mastectomy. She is status post 4 cycles of dose dense Adriamycin and cyclophosphamide completed 01/09/2014. She is currently being treated with weekly carboplatin and Taxol. She's had some issues with neutropenia and receives Neupogen/Graniix. Her counts have completely recovered with a total white count 6.2 and ANC of 3.7. Patient was reviewed with Dr. Lona Kettle. She will return in one week to  resume her weekly chemotherapy with carboplatin and Taxol. If her diarrhea persists patient was last to try over-the-counter Imodium and to let us know if this does not handle her symptoms. She was given a refill prescription for tramadol 50 mg by mouth every 6 hours as needed for pain a total 30 with no refill. She is advised to call her insurance company regarding  the coverage of her Lantus insulin. She'll follow-up with Dr. Lona Kettle in one week for another symptom management visit.    Wynetta Emery, Azai Gaffin E, PA-C 10/12/20154:57 PM

## 2014-03-27 NOTE — Telephone Encounter (Signed)
gv pt appt schedule for oct/nov

## 2014-04-03 ENCOUNTER — Telehealth: Payer: Self-pay | Admitting: Hematology

## 2014-04-03 ENCOUNTER — Other Ambulatory Visit (HOSPITAL_BASED_OUTPATIENT_CLINIC_OR_DEPARTMENT_OTHER): Payer: BC Managed Care – PPO

## 2014-04-03 ENCOUNTER — Encounter: Payer: Self-pay | Admitting: Hematology

## 2014-04-03 ENCOUNTER — Ambulatory Visit (HOSPITAL_BASED_OUTPATIENT_CLINIC_OR_DEPARTMENT_OTHER): Payer: BC Managed Care – PPO

## 2014-04-03 ENCOUNTER — Ambulatory Visit (HOSPITAL_BASED_OUTPATIENT_CLINIC_OR_DEPARTMENT_OTHER): Payer: BC Managed Care – PPO | Admitting: Hematology

## 2014-04-03 VITALS — BP 134/67 | HR 94 | Temp 98.2°F | Resp 18 | Ht 64.0 in | Wt 165.3 lb

## 2014-04-03 DIAGNOSIS — C50911 Malignant neoplasm of unspecified site of right female breast: Secondary | ICD-10-CM

## 2014-04-03 DIAGNOSIS — Z171 Estrogen receptor negative status [ER-]: Secondary | ICD-10-CM

## 2014-04-03 DIAGNOSIS — C50919 Malignant neoplasm of unspecified site of unspecified female breast: Secondary | ICD-10-CM

## 2014-04-03 DIAGNOSIS — N611 Abscess of the breast and nipple: Secondary | ICD-10-CM

## 2014-04-03 DIAGNOSIS — Z5111 Encounter for antineoplastic chemotherapy: Secondary | ICD-10-CM

## 2014-04-03 DIAGNOSIS — D6481 Anemia due to antineoplastic chemotherapy: Secondary | ICD-10-CM

## 2014-04-03 LAB — CBC WITH DIFFERENTIAL/PLATELET
BASO%: 0.4 % (ref 0.0–2.0)
BASOS ABS: 0 10*3/uL (ref 0.0–0.1)
EOS ABS: 0.1 10*3/uL (ref 0.0–0.5)
EOS%: 2.6 % (ref 0.0–7.0)
HEMATOCRIT: 30.2 % — AB (ref 34.8–46.6)
HEMOGLOBIN: 10 g/dL — AB (ref 11.6–15.9)
LYMPH#: 1.2 10*3/uL (ref 0.9–3.3)
LYMPH%: 23.4 % (ref 14.0–49.7)
MCH: 31.7 pg (ref 25.1–34.0)
MCHC: 33.1 g/dL (ref 31.5–36.0)
MCV: 95.9 fL (ref 79.5–101.0)
MONO#: 0.5 10*3/uL (ref 0.1–0.9)
MONO%: 10 % (ref 0.0–14.0)
NEUT%: 63.6 % (ref 38.4–76.8)
NEUTROS ABS: 3.2 10*3/uL (ref 1.5–6.5)
Platelets: 246 10*3/uL (ref 145–400)
RBC: 3.15 10*6/uL — ABNORMAL LOW (ref 3.70–5.45)
RDW: 14.8 % — ABNORMAL HIGH (ref 11.2–14.5)
WBC: 5.1 10*3/uL (ref 3.9–10.3)

## 2014-04-03 LAB — COMPREHENSIVE METABOLIC PANEL (CC13)
ALBUMIN: 3.5 g/dL (ref 3.5–5.0)
ALT: 17 U/L (ref 0–55)
AST: 17 U/L (ref 5–34)
Alkaline Phosphatase: 96 U/L (ref 40–150)
Anion Gap: 7 mEq/L (ref 3–11)
BILIRUBIN TOTAL: 0.29 mg/dL (ref 0.20–1.20)
BUN: 13.8 mg/dL (ref 7.0–26.0)
CO2: 26 mEq/L (ref 22–29)
Calcium: 9.3 mg/dL (ref 8.4–10.4)
Chloride: 108 mEq/L (ref 98–109)
Creatinine: 0.7 mg/dL (ref 0.6–1.1)
Glucose: 117 mg/dl (ref 70–140)
POTASSIUM: 3.7 meq/L (ref 3.5–5.1)
Sodium: 141 mEq/L (ref 136–145)
TOTAL PROTEIN: 6.1 g/dL — AB (ref 6.4–8.3)

## 2014-04-03 MED ORDER — ONDANSETRON 16 MG/50ML IVPB (CHCC)
INTRAVENOUS | Status: AC
  Filled 2014-04-03: qty 16

## 2014-04-03 MED ORDER — SODIUM CHLORIDE 0.9 % IV SOLN
Freq: Once | INTRAVENOUS | Status: AC
  Administered 2014-04-03: 13:00:00 via INTRAVENOUS

## 2014-04-03 MED ORDER — ONDANSETRON 16 MG/50ML IVPB (CHCC)
16.0000 mg | Freq: Once | INTRAVENOUS | Status: AC
Start: 2014-04-03 — End: 2014-04-03
  Administered 2014-04-03: 16 mg via INTRAVENOUS

## 2014-04-03 MED ORDER — SODIUM CHLORIDE 0.9 % IJ SOLN
10.0000 mL | INTRAMUSCULAR | Status: DC | PRN
  Administered 2014-04-03: 10 mL
  Filled 2014-04-03: qty 10

## 2014-04-03 MED ORDER — CARBOPLATIN CHEMO INJECTION 450 MG/45ML
229.0000 mg | Freq: Once | INTRAVENOUS | Status: AC
  Administered 2014-04-03: 230 mg via INTRAVENOUS
  Filled 2014-04-03: qty 23

## 2014-04-03 MED ORDER — FAMOTIDINE IN NACL 20-0.9 MG/50ML-% IV SOLN
20.0000 mg | Freq: Once | INTRAVENOUS | Status: AC
  Administered 2014-04-03: 20 mg via INTRAVENOUS

## 2014-04-03 MED ORDER — TRAMADOL HCL 50 MG PO TABS: 50.0000 mg | ORAL_TABLET | Freq: Four times a day (QID) | ORAL | Status: DC | PRN

## 2014-04-03 MED ORDER — HEPARIN SOD (PORK) LOCK FLUSH 100 UNIT/ML IV SOLN
500.0000 [IU] | Freq: Once | INTRAVENOUS | Status: AC | PRN
  Administered 2014-04-03: 500 [IU]
  Filled 2014-04-03: qty 5

## 2014-04-03 MED ORDER — FAMOTIDINE IN NACL 20-0.9 MG/50ML-% IV SOLN
INTRAVENOUS | Status: AC
  Filled 2014-04-03: qty 50

## 2014-04-03 MED ORDER — PACLITAXEL CHEMO INJECTION 300 MG/50ML
80.0000 mg/m2 | Freq: Once | INTRAVENOUS | Status: AC
  Administered 2014-04-03: 150 mg via INTRAVENOUS
  Filled 2014-04-03: qty 25

## 2014-04-03 MED ORDER — DEXAMETHASONE SODIUM PHOSPHATE 20 MG/5ML IJ SOLN
20.0000 mg | Freq: Once | INTRAMUSCULAR | Status: AC
  Administered 2014-04-03: 20 mg via INTRAVENOUS

## 2014-04-03 MED ORDER — DEXAMETHASONE SODIUM PHOSPHATE 20 MG/5ML IJ SOLN
INTRAMUSCULAR | Status: AC
  Filled 2014-04-03: qty 5

## 2014-04-03 MED ORDER — DIPHENHYDRAMINE HCL 50 MG/ML IJ SOLN
25.0000 mg | Freq: Once | INTRAMUSCULAR | Status: AC
  Administered 2014-04-03: 25 mg via INTRAVENOUS

## 2014-04-03 MED ORDER — DIPHENHYDRAMINE HCL 50 MG/ML IJ SOLN
INTRAMUSCULAR | Status: AC
  Filled 2014-04-03: qty 1

## 2014-04-03 NOTE — Telephone Encounter (Signed)
Pt confirmed labs/ov per 10/19 POF,  Sent msg to MD to see if he will see pt in chemo room per [facility] for 10/26 per 10/19 POF MD to be seen, there is no MD/ML available, gave pt AVS.... KJ

## 2014-04-03 NOTE — Patient Instructions (Signed)
[facility] Discharge Instructions for Patients Receiving Chemotherapy  Today you received the following chemotherapy agents Taxol/Carboplatin To help prevent nausea and vomiting after your treatment, we encourage you to take your nausea medication as prescribed.  If you develop nausea and vomiting that is not controlled by your nausea medication, call the clinic.   BELOW ARE SYMPTOMS THAT SHOULD BE REPORTED IMMEDIATELY:  *FEVER GREATER THAN 100.5 F  *CHILLS WITH OR WITHOUT FEVER  NAUSEA AND VOMITING THAT IS NOT CONTROLLED WITH YOUR NAUSEA MEDICATION  *UNUSUAL SHORTNESS OF BREATH  *UNUSUAL BRUISING OR BLEEDING  TENDERNESS IN MOUTH AND THROAT WITH OR WITHOUT PRESENCE OF ULCERS  *URINARY PROBLEMS  *BOWEL PROBLEMS  UNUSUAL RASH Items with * indicate a potential emergency and should be followed up as soon as possible.  Feel free to call the clinic you have any questions or concerns. The clinic phone number is [phone].    

## 2014-04-03 NOTE — Progress Notes (Signed)
[name]  [phone]) [phone] Fax:([phone]) [phone]  Clinic Follow up Note 04/03/2014    ID: Carmen Stone OB: [id number]  MR#: [id number]  WOE#:[id number]  PCP: [name], MD SU: [name] MD  CHIEF COMPLAINT: Triple negative breast cancer, locally advanced   CURRENT TREATMENT: Adjuvant chemotherapy. Patient on Carboplatin and Taxol weekly regimen. Week 8 or cycle 8 today. Her chemotherapy was held last week because of diarrhea.    BREAST CANCER HISTORY: As per previously dictated Dr.Magrinat's note:  Ms. Carmen Stone presented to the emergency room on 10/22/2013 complaining of right breast pain and discharge. On exam the breast was diffusely erythematous with" orange peel" appearance. She was admitted for debridement and surgery he describes a skin defect in the lower inner quadrant measuring approximately 4 cm. When the breast was breast, air came out through the hole--there are photos included with the admission note. A chest x-ray was obtained which showed the right breast to be asymmetrically enlarged with extensive subcutaneous gas.  On the same day the patient underwent debridement of the wound, with the pathology ([id number] [id number]) showing high-grade carcinoma involving ulcerated skin. There was tumor within the dermal and subcutaneous lymphatics. The tumor was negative for estrogen, progesterone, and HER-2(signals ratio 1.24, number per cell 3.6), with an MIB-1 of 95%. Gross cystic disease fluid protein was also negative.  The patient was admitted and started on antibiotics. On 10/24/2013 an attempt was made at further debridement, but the inside of the breast essentially came out in hand falls. The pathology from this procedure(SZA [id number] and 26) again showed high-grade carcinoma.  On 10/25/2013 the patient underwent CT scans of the chest abdomen and pelvis. Aside from disease in the breast and right axilla there was no evidence of metastatic  disease.     Accordingly Dr. [name] proceeded to right mastectomy with axillary lymph node dissection 10/26/2013. Results of this procedure ([id number]) showed a 15 cm defect in the middle of the breast, which is 4 prior necrotic tissue had been scoped out. There was nevertheless extensive invasive ductal carcinoma, grade 3, in the specimen and repeat or blastic panel was again triple negative. 2 of 16 axillary lymph nodes were involved by macrometastatic deposits. The patient was continued on antibiotics and eventually discharged 11/03/2013.  Initial chemotherapy with dose dense doxorubicin and cyclophosphamide x 4 cycles completed 01/09/2014. Now plan is to give carboplatin and paclitaxel weekly x12 doses, today is week 8, and postmastectomy radiation to follow after completion of chemotherapy.  INTERVAL HISTORY:  Carmen Stone returns today for followup of her breast cancer on chemotherapy. She has completed chemotherapy with cyclophosphamide and doxorubicin and received the fourth and final cycle. She got 7 cycles of Carboplatin and Taxol. Patient is doing well. She has alopecia complete now from [facility] chemo. She now gets neupogen shots for 2 days after each week of carboplatin and taxol. Her nails have started to come off. She had grade 1 neuropathy and voices no new symptoms. Her diarrhea has resolved.   REVIEW OF SYSTEMS: Carmen Stone did remarkably well with the first part of her chemotherapy. She never had nausea or vomiting problems. She was fatigued but now recovered. No chest pain, palpitations, SOB, ankle edema, or GI symptoms. She does not like Aleve, which upsets her stomach, and Tylenol is not sufficient. She does not get constipated from the tramadol and she got a script today for it 30 pills.she asked for protonix and magic mouth wash today and a refill on emal cream  which were all e-scripted to pharmacy. Aside from this a detailed review of systems today was negative.   PAST MEDICAL  HISTORY: Past Medical History  Diagnosis Date  . Diabetes mellitus, type II   . Inflammatory breast cancer     PAST SURGICAL HISTORY:q2`  Past Surgical History  Procedure Laterality Date  . Cesarean section    . Ventral hernia repair  2011    repair incacerated VH with biologic mesh; cholecystectomy  . Cholecystectomy  2011  . Incision and drainage of wound Right 10/22/2013    Procedure: IRRIGATION AND DRAINAGE AND DEBRIDEMENT RIGHT BREAST WOUND;  Surgeon: [name], MD;  Location: [facility];  Service: General;  Laterality: Right;  . Breast biopsy Right 10/22/2013    Procedure: BREAST BIOPSY;  Surgeon: [name], MD;  Location: [place];  Service: General;  Laterality: Right;  . Irrigation and debridement abscess Right 10/24/2013    Procedure: Right total mastectomy;  Surgeon: [name], MD;  Location: [facility];  Service: General;  Laterality: Right;  . Dressing change under anesthesia Right 10/26/2013    Procedure: COMPLETION MASTECTOMY, AXILLARY DISSECTION;  Surgeon: [name], MD;  Location: [facility];  Service: General;  Laterality: Right;  . Portacath placement N/A 11/16/2013    Procedure: INSERTION PORT-A-CATH;  Surgeon: [name], MD;  Location: [facility];  Service: General;  Laterality: N/A;    FAMILY HISTORY History reviewed. No pertinent family history. The patient's father was a heavy smoker and died from complications of emphysema at the age of 109. The patient's mother died from lung cancer at the age of 39. She also was a heavy smoker. The patient had one brother, no sisters. There is no history of breast or ovarian cancer in the family.  GYNECOLOGIC HISTORY:  Menarche age 42, first live birth age 49, the patient is GX P1. She went through the change of life approximately age 29. She did not take hormone replacement.  SOCIAL HISTORY:  Carmen Stone is divorced. She works for [employer] and also works a second job at [employer] on [employer]. She  plans to be on disability during her chemoradiation treatments. She lives alone, but her daughter Carmen Stone frequently stays there overnight. 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 She spends her time between helping her mom and helping her grandfather, Verne Cove, who lives in [place] and has significant dementia. Mr. Edmunds was present at the 11/18/2013 visit only because Carmen Stone wanted to be present and otherwise no one would have been with Mr. Cannell. Majesty has no grandchildren. She attends a [employer] adult church.   ADVANCED DIRECTIVES: In place. The patient has named her brother Clide Cliff as her healthcare power of attorney. Iona Beard can be reached at [phone]. He is a Pharmacist, hospital locally  HEALTH MAINTENANCE: History  Substance Use Topics  . Smoking status: Never Smoker   . Smokeless tobacco: Not on file  . Alcohol Use: No     Colonoscopy:  PAP:  Bone density:  Lipid panel:  No Known Allergies  Current Outpatient Prescriptions  Medication Sig Dispense Refill  . acetaminophen (TYLENOL) 325 MG tablet Take 650 mg by mouth every 6 (six) hours as needed for mild pain.      Marland Kitchen dexamethasone (DECADRON) 4 MG tablet Take 2 tablets by mouth once a day on the day after chemotherapy and then take 2 tablets two times a day for 2 days. Take with food.  30 tablet  1  . Diphenhyd-Hydrocort-Nystatin (FIRST-DUKES MOUTHWASH) SUSP Swish and spit 5 mls by mouth every 6 hours for mouth sores and use before each meal.  120 mL  1  . glucose blood test strip Use as instructed  100 each  12  . glucose monitoring kit (FREESTYLE) monitoring kit 1 each by Does not apply route as needed for other. Check blood sugars daily.  1 each  0  . Insulin Glargine (LANTUS SOLOSTAR) 100 UNIT/ML Solostar Pen Inject 10 Units into the skin daily at 10 pm.  15 mL  3  . linagliptin (TRADJENTA) 5 MG TABS tablet Take 1 tablet (5 mg total) by mouth daily.  30 tablet  0  . LORazepam  (ATIVAN) 0.5 MG tablet Take 1 tablet (0.5 mg total) by mouth at bedtime as needed (Nausea or vomiting).  30 tablet  0  . ondansetron (ZOFRAN) 8 MG tablet Take 1 tablet (8 mg total) by mouth every 8 (eight) hours as needed for nausea or vomiting.  20 tablet  3  . pantoprazole (PROTONIX) 40 MG tablet Take 1 tablet (40 mg total) by mouth daily at 6 (six) AM.  30 tablet  3  . traMADol (ULTRAM) 50 MG tablet Take 1 tablet (50 mg total) by mouth every 6 (six) hours as needed.  30 tablet  0   No current facility-administered medications for this visit.    OBJECTIVE: Middle-aged [place] American woman who appears stated age 56 Vitals:   04/03/14 1132  BP: 134/67  Pulse: 94  Temp: 98.2 F (36.8 C)  Resp: 18     Body mass index is 28.36 kg/(m^2).    ECOG FS:1  Sclerae unicteric, pupils equal and reactive Oropharynx clear and moist, no thrush or mucositis No cervical or supraclavicular adenopathy Lungs no rales or rhonchi Heart regular rate and rhythm Abd soft, nontender, positive bowel sounds MSK no focal spinal tenderness, no upper extremity lymphedema, nails breaking. pic above Neuro: nonfocal, well oriented, appropriate affect Breasts: Deferred   RADIOLOGY DATA   CT chest, abdomen and pelvis 10/26/2013:   1. Postoperative changes of recent right breast surgery with packed open wound which has a thick rind of soft tissue, some of which  enhances, a 1.3 x 1.5 cm enhancing lesion in the medial aspect of the right breast which may represent residual tumor or a malignant  lymph node, and multiple enlarged right axillary nodes concerning for axillary nodal metastases.  2. No other definite signs of metastatic disease in the chest, abdomen or pelvis.  3. Multiple small ventral hernias containing several loops of small bowel, as well as the mid transverse colon, without associated bowel  incarceration or obstruction at this time.  4. Trace bilateral pleural effusions with mild dependent  subsegmental atelectasis in the lower lobes of the lungs bilaterally.  5. Status post cholecystectomy and hysterectomy.      LABS: Reviewed.        ASSESSMENT/PLAN: 58 y.o. [place] woman  #1. status post right modified radical mastectomy 10/26/2013 for a cT4, pT3 pN1, stage IIIB invasive ductal carcinoma, grade 3, triple negative, with an MIB-1 of 95% . #2. Initial chemotherapy with dose dense doxorubicin and cyclophosphamide x 4 cycles completed 01/09/2014  #3. carboplatin and paclitaxel weekly x12 doses, started on 01/23/2014. With second week of therapy, she had a mild reaction to Taxol which was adequately treated. Today her 8th week of Taxol is planned. She will get 2 doses of neupogen for white count recovery.  I did  a physical exam and checked her labs. She has moderate anemia related to chemotherapy. She started taking iron daily. No transfusion unless hemoglobin is below 8 grams.  #4  postmastectomy radiation to follow after completion of chemotherapy.  #5 We will continue to monitor her labs weekly and do a toxicity screening and a visit with a provider. We gave a script for Tramadol today.   #6 RTC next week for week 9 of carboplatin and taxol.     Bernadene Bell, MD Medical Hematologist/Oncologist [place] Pager: [phone] Office No: [phone]

## 2014-04-04 ENCOUNTER — Ambulatory Visit (HOSPITAL_BASED_OUTPATIENT_CLINIC_OR_DEPARTMENT_OTHER): Payer: BC Managed Care – PPO

## 2014-04-04 VITALS — BP 137/69 | HR 92 | Temp 98.1°F

## 2014-04-04 DIAGNOSIS — C50911 Malignant neoplasm of unspecified site of right female breast: Secondary | ICD-10-CM

## 2014-04-04 DIAGNOSIS — Z5189 Encounter for other specified aftercare: Secondary | ICD-10-CM

## 2014-04-04 DIAGNOSIS — C50919 Malignant neoplasm of unspecified site of unspecified female breast: Secondary | ICD-10-CM

## 2014-04-04 MED ORDER — TBO-FILGRASTIM 300 MCG/0.5ML ~~LOC~~ SOSY
300.0000 ug | PREFILLED_SYRINGE | Freq: Once | SUBCUTANEOUS | Status: AC
  Administered 2014-04-04: 300 ug via SUBCUTANEOUS
  Filled 2014-04-04: qty 0.5

## 2014-04-04 NOTE — Patient Instructions (Signed)

## 2014-04-05 ENCOUNTER — Ambulatory Visit (HOSPITAL_BASED_OUTPATIENT_CLINIC_OR_DEPARTMENT_OTHER): Payer: BC Managed Care – PPO

## 2014-04-05 VITALS — BP 144/63 | HR 92 | Temp 98.2°F

## 2014-04-05 DIAGNOSIS — C50911 Malignant neoplasm of unspecified site of right female breast: Secondary | ICD-10-CM

## 2014-04-05 DIAGNOSIS — C50919 Malignant neoplasm of unspecified site of unspecified female breast: Secondary | ICD-10-CM

## 2014-04-05 DIAGNOSIS — Z5189 Encounter for other specified aftercare: Secondary | ICD-10-CM

## 2014-04-05 MED ORDER — TBO-FILGRASTIM 300 MCG/0.5ML ~~LOC~~ SOSY
300.0000 ug | PREFILLED_SYRINGE | Freq: Once | SUBCUTANEOUS | Status: AC
  Administered 2014-04-05: 300 ug via SUBCUTANEOUS
  Filled 2014-04-05: qty 0.5

## 2014-04-06 ENCOUNTER — Encounter: Payer: Self-pay | Admitting: *Deleted

## 2014-04-06 NOTE — Progress Notes (Signed)
[name]  Clinical Social Work was referred by patient for assessment of psychosocial needs due to ongoing financial concerns.  Clinical Education officer, museum met with or patient at [facility] in [place] office to offer support and assess for needs.  CSW team applied for [employer] [place], [employer] and [employer] back in late August, early September. CSW has called these resources in recent weeks and again today to check on status of applications. No updates to date, but [facility] appears to have asked pt for additional information. Pt plans to gather and bring to CSW to fax to them in the near future. CSW also gave pt additional resource information today. CSW suggested pt apply for Cancer Care, gave list of food pantries and the urban ministry could help possibly with utilities. Pt is still attempting to work while in treatment. CSW encouraged pt to visit financial counselors again to assist as well. She was approved for grant funds. Pt very stressed as her financial situation is worsening. She has applied for food stamps as well and will let CSW know if help is needed with that application. CSW provided supportive listening as well. Pt still needs to complete ADRs and plans to complete next week. CSW will follow closely and will see again next week.   Clinical Social Work interventions: Emotional support Human resources officer and resource navigation  Carmen Stone, [place] Worker [place]  [place] Phone: [phone] Fax: [phone]

## 2014-04-07 ENCOUNTER — Telehealth: Payer: Self-pay | Admitting: *Deleted

## 2014-04-07 NOTE — Telephone Encounter (Signed)
Pt came to clinic as a walk-in.  Spoke with pt in the lobby;  Was informed by pt that she had stiffness in left leg early this am, but symptom resolved now, and pt is able to walk normally at present.  Pt also stated she had loose stools x 2 , and 1 diarrhea episode this am.  Pt did not know about taking Imodium.  Instructed pt on how to take Imodium to help with diarrhea.  Pt stated she is drinking lots of fluids.  Pt has office visit appt with Dr. Lona Kettle on  04/10/14.   Dr. Alen Blew notified.  No new orders received.  Spoke with pt again and instructed pt to keep appt with Dr. Lona Kettle on 04/10/14;  Pt can also take Tramadol as needed for leg pain as per Dr. Alen Blew. Gave pt a note for work as per pt's request.

## 2014-04-10 ENCOUNTER — Telehealth: Payer: Self-pay | Admitting: Hematology

## 2014-04-10 ENCOUNTER — Ambulatory Visit (HOSPITAL_BASED_OUTPATIENT_CLINIC_OR_DEPARTMENT_OTHER): Payer: BC Managed Care – PPO | Admitting: Hematology

## 2014-04-10 ENCOUNTER — Ambulatory Visit (HOSPITAL_BASED_OUTPATIENT_CLINIC_OR_DEPARTMENT_OTHER): Payer: BC Managed Care – PPO

## 2014-04-10 ENCOUNTER — Other Ambulatory Visit: Payer: Self-pay | Admitting: *Deleted

## 2014-04-10 ENCOUNTER — Other Ambulatory Visit (HOSPITAL_BASED_OUTPATIENT_CLINIC_OR_DEPARTMENT_OTHER): Payer: BC Managed Care – PPO

## 2014-04-10 VITALS — BP 135/51 | HR 94 | Temp 98.2°F | Resp 17

## 2014-04-10 DIAGNOSIS — C50911 Malignant neoplasm of unspecified site of right female breast: Secondary | ICD-10-CM

## 2014-04-10 DIAGNOSIS — C50919 Malignant neoplasm of unspecified site of unspecified female breast: Secondary | ICD-10-CM

## 2014-04-10 DIAGNOSIS — Z5111 Encounter for antineoplastic chemotherapy: Secondary | ICD-10-CM

## 2014-04-10 DIAGNOSIS — Z171 Estrogen receptor negative status [ER-]: Secondary | ICD-10-CM

## 2014-04-10 DIAGNOSIS — Z9011 Acquired absence of right breast and nipple: Secondary | ICD-10-CM

## 2014-04-10 LAB — CBC WITH DIFFERENTIAL/PLATELET
BASO%: 1 % (ref 0.0–2.0)
BASOS ABS: 0.1 10*3/uL (ref 0.0–0.1)
EOS%: 2.7 % (ref 0.0–7.0)
Eosinophils Absolute: 0.2 10*3/uL (ref 0.0–0.5)
HCT: 31.8 % — ABNORMAL LOW (ref 34.8–46.6)
HEMOGLOBIN: 10.5 g/dL — AB (ref 11.6–15.9)
LYMPH%: 23.5 % (ref 14.0–49.7)
MCH: 32 pg (ref 25.1–34.0)
MCHC: 32.9 g/dL (ref 31.5–36.0)
MCV: 97.2 fL (ref 79.5–101.0)
MONO#: 1 10*3/uL — ABNORMAL HIGH (ref 0.1–0.9)
MONO%: 15 % — AB (ref 0.0–14.0)
NEUT#: 3.8 10*3/uL (ref 1.5–6.5)
NEUT%: 57.8 % (ref 38.4–76.8)
PLATELETS: 258 10*3/uL (ref 145–400)
RBC: 3.27 10*6/uL — ABNORMAL LOW (ref 3.70–5.45)
RDW: 15.2 % — ABNORMAL HIGH (ref 11.2–14.5)
WBC: 6.6 10*3/uL (ref 3.9–10.3)
lymph#: 1.6 10*3/uL (ref 0.9–3.3)

## 2014-04-10 LAB — COMPREHENSIVE METABOLIC PANEL (CC13)
ALK PHOS: 93 U/L (ref 40–150)
ALT: 14 U/L (ref 0–55)
AST: 12 U/L (ref 5–34)
Albumin: 3.7 g/dL (ref 3.5–5.0)
Anion Gap: 7 mEq/L (ref 3–11)
BUN: 14.7 mg/dL (ref 7.0–26.0)
CALCIUM: 9.3 mg/dL (ref 8.4–10.4)
CO2: 26 mEq/L (ref 22–29)
Chloride: 109 mEq/L (ref 98–109)
Creatinine: 0.7 mg/dL (ref 0.6–1.1)
GLUCOSE: 143 mg/dL — AB (ref 70–140)
Potassium: 3.8 mEq/L (ref 3.5–5.1)
Sodium: 141 mEq/L (ref 136–145)
Total Bilirubin: 0.25 mg/dL (ref 0.20–1.20)
Total Protein: 6.4 g/dL (ref 6.4–8.3)

## 2014-04-10 MED ORDER — ONDANSETRON 16 MG/50ML IVPB (CHCC)
16.0000 mg | Freq: Once | INTRAVENOUS | Status: AC
  Administered 2014-04-10: 16 mg via INTRAVENOUS

## 2014-04-10 MED ORDER — FAMOTIDINE IN NACL 20-0.9 MG/50ML-% IV SOLN
INTRAVENOUS | Status: AC
  Filled 2014-04-10: qty 50

## 2014-04-10 MED ORDER — SODIUM CHLORIDE 0.9 % IJ SOLN
10.0000 mL | INTRAMUSCULAR | Status: DC | PRN
  Administered 2014-04-10: 10 mL
  Filled 2014-04-10: qty 10

## 2014-04-10 MED ORDER — SODIUM CHLORIDE 0.9 % IV SOLN
Freq: Once | INTRAVENOUS | Status: AC
  Administered 2014-04-10: 14:00:00 via INTRAVENOUS

## 2014-04-10 MED ORDER — DIPHENHYDRAMINE HCL 50 MG/ML IJ SOLN
INTRAMUSCULAR | Status: AC
  Filled 2014-04-10: qty 1

## 2014-04-10 MED ORDER — PACLITAXEL CHEMO INJECTION 300 MG/50ML
80.0000 mg/m2 | Freq: Once | INTRAVENOUS | Status: AC
  Administered 2014-04-10: 150 mg via INTRAVENOUS
  Filled 2014-04-10: qty 25

## 2014-04-10 MED ORDER — SODIUM CHLORIDE 0.9 % IV SOLN
229.0000 mg | Freq: Once | INTRAVENOUS | Status: AC
  Administered 2014-04-10: 230 mg via INTRAVENOUS
  Filled 2014-04-10: qty 23

## 2014-04-10 MED ORDER — DEXAMETHASONE SODIUM PHOSPHATE 20 MG/5ML IJ SOLN
INTRAMUSCULAR | Status: AC
  Filled 2014-04-10: qty 5

## 2014-04-10 MED ORDER — DIPHENHYDRAMINE HCL 50 MG/ML IJ SOLN
25.0000 mg | Freq: Once | INTRAMUSCULAR | Status: AC
  Administered 2014-04-10: 25 mg via INTRAVENOUS

## 2014-04-10 MED ORDER — DEXAMETHASONE SODIUM PHOSPHATE 20 MG/5ML IJ SOLN
20.0000 mg | Freq: Once | INTRAMUSCULAR | Status: AC
  Administered 2014-04-10: 20 mg via INTRAVENOUS

## 2014-04-10 MED ORDER — FAMOTIDINE IN NACL 20-0.9 MG/50ML-% IV SOLN
20.0000 mg | Freq: Once | INTRAVENOUS | Status: AC
Start: 2014-04-10 — End: 2014-04-10
  Administered 2014-04-10: 20 mg via INTRAVENOUS

## 2014-04-10 MED ORDER — HEPARIN SOD (PORK) LOCK FLUSH 100 UNIT/ML IV SOLN
500.0000 [IU] | Freq: Once | INTRAVENOUS | Status: AC | PRN
  Administered 2014-04-10: 500 [IU]
  Filled 2014-04-10: qty 5

## 2014-04-10 MED ORDER — TRAMADOL HCL 50 MG PO TABS: 50.0000 mg | ORAL_TABLET | Freq: Four times a day (QID) | ORAL | Status: DC | PRN

## 2014-04-10 MED ORDER — ONDANSETRON 16 MG/50ML IVPB (CHCC)
INTRAVENOUS | Status: AC
  Filled 2014-04-10: qty 16

## 2014-04-10 NOTE — Progress Notes (Signed)
Carmen Stone  [phone] [phone]  Clinic Follow up Note 04/10/2014    ID: Carmen Stone OB: 06/22/1955  MR#: [id number]  JYN#:[id number]  PCP: Carmen Palau, MD SU: Carmen Bookbinder MD  CHIEF COMPLAINT: Triple negative breast cancer, locally advanced   CURRENT TREATMENT: Adjuvant chemotherapy. Patient on Carboplatin and Taxol weekly regimen. Week 8 or cycle 8 today. Her chemotherapy was held last week because of diarrhea.    BREAST CANCER HISTORY: As per previously dictated Carmen Stone's note:  Carmen Stone presented to the emergency room on 10/22/2013 complaining of right breast pain and discharge. On exam the breast was diffusely erythematous with" orange peel" appearance. She was admitted for debridement and surgery he describes a skin defect in the lower inner quadrant measuring approximately 4 cm. When the breast was breast, air came out through the hole--there are photos included with the admission note. A chest x-ray was obtained which showed the right breast to be asymmetrically enlarged with extensive subcutaneous gas.  On the same day the patient underwent debridement of the wound, with the pathology (SZA [id number]) showing high-grade carcinoma involving ulcerated skin. There was tumor within the dermal and subcutaneous lymphatics. The tumor was negative for estrogen, progesterone, and HER-2(signals ratio 1.24, number per cell 3.6), with an MIB-1 of 95%. Gross cystic disease fluid protein was also negative.  The patient was admitted and started on antibiotics. On 10/24/2013 an attempt was made at further debridement, but the inside of the breast essentially came out in hand falls. The pathology from this procedure(SZA 15-20 and 26) again showed high-grade carcinoma.  On 10/25/2013 the patient underwent CT scans of the chest abdomen and pelvis. Aside from disease in the breast and right axilla there was no evidence of metastatic  disease.     Accordingly Carmen Stone proceeded to right mastectomy with axillary lymph node dissection 10/26/2013. Results of this procedure ([id number]) showed a 15 cm defect in the middle of the breast, which is 4 prior necrotic tissue had been scoped out. There was nevertheless extensive invasive ductal carcinoma, grade 3, in the specimen and repeat or blastic panel was again triple negative. 2 of 16 axillary lymph nodes were involved by macrometastatic deposits. The patient was continued on antibiotics and eventually discharged 11/03/2013.  Initial chemotherapy with dose dense doxorubicin and cyclophosphamide x 4 cycles completed 01/09/2014. Now plan is to give carboplatin and paclitaxel weekly x12 doses, today is week 8, and postmastectomy radiation to follow after completion of chemotherapy.  INTERVAL HISTORY:  Carmen Stone returns today for followup of her breast cancer on chemotherapy. She has completed chemotherapy with cyclophosphamide and doxorubicin and received the fourth and final cycle. She got 8 cycles of Carboplatin and Taxol. Patient is doing well. She has alopecia complete now from [facility] chemo. She now gets neupogen shots for 2 days after each week of carboplatin and taxol. Her nails have started to come off. She had grade 1 neuropathy and voices no new symptoms. She did have diarrhea for 2 days which resolved with proper use of Imodium. Rest of the ROS is negative.   PAST MEDICAL HISTORY: Past Medical History  Diagnosis Date  . Diabetes mellitus, type II   . Inflammatory breast cancer     PAST SURGICAL HISTORY:q2`  Past Surgical History  Procedure Laterality Date  . Cesarean section    . Ventral hernia repair  2011    repair incacerated VH with biologic mesh; cholecystectomy  . Cholecystectomy  2011  . Incision  and drainage of wound Right 10/22/2013    Procedure: IRRIGATION AND DRAINAGE AND DEBRIDEMENT RIGHT BREAST WOUND;  Surgeon: Carmen Curry, MD;  Location: [place];   Service: General;  Laterality: Right;  . Breast biopsy Right 10/22/2013    Procedure: BREAST BIOPSY;  Surgeon: Carmen Curry, MD;  Location: [place];  Service: General;  Laterality: Right;  . Irrigation and debridement abscess Right 10/24/2013    Procedure: Right total mastectomy;  Surgeon: Carmen Bookbinder, MD;  Location: [place];  Service: General;  Laterality: Right;  . Dressing change under anesthesia Right 10/26/2013    Procedure: COMPLETION MASTECTOMY, AXILLARY DISSECTION;  Surgeon: Carmen Bookbinder, MD;  Location: [place];  Service: General;  Laterality: Right;  . Portacath placement N/A 11/16/2013    Procedure: INSERTION PORT-A-CATH;  Surgeon: Carmen Bookbinder, MD;  Location: [place] [facility];  Service: General;  Laterality: N/A;    FAMILY HISTORY History reviewed. No pertinent family history. The patient's father was a heavy smoker and died from complications of emphysema at the age of 76. The patient's mother died from lung cancer at the age of 22. She also was a heavy smoker. The patient had one brother, no sisters. There is no history of breast or ovarian cancer in the family.  GYNECOLOGIC HISTORY:  Menarche age 61, first live birth age 59, the patient is GX P1. She went through the change of life approximately age 78. She did not take hormone replacement.  SOCIAL HISTORY:  Carmen Stone is divorced. She works for [employer] and also works a second job at [employer] on [employer]. She plans to be on disability during her chemoradiation treatments. She lives alone, but her daughter Carmen Stone frequently stays there overnight. 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 She spends her time between helping her mom and helping her grandfather, Carmen Stone, who lives in [place] and has significant dementia. Carmen Stone was present at the 11/18/2013 visit only because Carmen Stone wanted to be present and otherwise no one would have been with Carmen Stone.  Carmen Stone has no grandchildren. She attends a [employer] church.   ADVANCED DIRECTIVES: In place. The patient has named her brother Carmen Stone [place] as her healthcare power of attorney. Iona Beard can be reached at [phone]. He is a Pharmacist, hospital locally  HEALTH MAINTENANCE: History  Substance Use Topics  . Smoking status: Never Smoker   . Smokeless tobacco: Not on file  . Alcohol Use: No     Colonoscopy:  PAP:  Bone density:  Lipid panel:  No Known Allergies  Current Outpatient Prescriptions  Medication Sig Dispense Refill  . acetaminophen (TYLENOL) 325 MG tablet Take 650 mg by mouth every 6 (six) hours as needed for mild pain.      Marland Kitchen dexamethasone (DECADRON) 4 MG tablet Take 2 tablets by mouth once a day on the day after chemotherapy and then take 2 tablets two times a day for 2 days. Take with food.  30 tablet  1  . Diphenhyd-Hydrocort-Nystatin (FIRST-DUKES MOUTHWASH) SUSP Swish and spit 5 mls by mouth every 6 hours for mouth sores and use before each meal.  120 mL  1  . glucose blood test strip Use as instructed  100 each  12  . glucose monitoring kit (FREESTYLE) monitoring kit 1 each by Does not apply route as needed for other. Check blood sugars daily.  1 each  0  . Insulin Glargine (LANTUS SOLOSTAR) 100 UNIT/ML Solostar Pen Inject 10 Units  into the skin daily at 10 pm.  15 mL  3  . linagliptin (TRADJENTA) 5 MG TABS tablet Take 1 tablet (5 mg total) by mouth daily.  30 tablet  0  . LORazepam (ATIVAN) 0.5 MG tablet Take 1 tablet (0.5 mg total) by mouth at bedtime as needed (Nausea or vomiting).  30 tablet  0  . ondansetron (ZOFRAN) 8 MG tablet Take 1 tablet (8 mg total) by mouth every 8 (eight) hours as needed for nausea or vomiting.  20 tablet  3  . pantoprazole (PROTONIX) 40 MG tablet Take 1 tablet (40 mg total) by mouth daily at 6 (six) AM.  30 tablet  3  . traMADol (ULTRAM) 50 MG tablet Take 1 tablet (50 mg total) by mouth every 6 (six) hours as needed.  30 tablet  0   No current  facility-administered medications for this visit.   Facility-Administered Medications Ordered in Other Visits  Medication Dose Route Frequency Provider Last Rate Last Dose  . 0.9 %  sodium chloride infusion   Intravenous Once Brittiany Wiehe Marla Roe, MD      . CARBOplatin (PARAPLATIN) 230 mg in sodium chloride 0.9 % 100 mL chemo infusion  230 mg Intravenous Once Olie Dibert Marla Roe, MD      . Dexamethasone Sodium Phosphate (DECADRON) injection 20 mg  20 mg Intravenous Once Malene Blaydes Marla Roe, MD      . diphenhydrAMINE (BENADRYL) injection 25 mg  25 mg Intravenous Once Wajiha Versteeg Marla Roe, MD      . famotidine (PEPCID) IVPB 20 mg  20 mg Intravenous Once Kenidy Crossland Marla Roe, MD      . heparin lock flush 100 unit/mL  500 Units Intracatheter Once PRN Zebadiah Willert Marla Roe, MD      . ondansetron (ZOFRAN) IVPB 16 mg  16 mg Intravenous Once Ziere Docken Marla Roe, MD      . PACLitaxel (TAXOL) 150 mg in dextrose 5 % 250 mL chemo infusion (</= 83m/m2)  80 mg/m2 (Treatment Plan Actual) Intravenous Once Nicholaus Steinke SMarla Roe MD      . sodium chloride 0.9 % injection 10 mL  10 mL Intracatheter PRN Tiwan Schnitker SMarla Roe MD        OBJECTIVE: Middle-aged [place] woman who appears stated age There were no vitals filed for this visit.   There is no weight on file to calculate BMI.    ECOG FS:1  Sclerae unicteric, pupils equal and reactive Oropharynx clear and moist, no thrush or mucositis No cervical or supraclavicular adenopathy Lungs no rales or rhonchi Heart regular rate and rhythm Abd soft, nontender, positive bowel sounds MSK no focal spinal tenderness, no upper extremity lymphedema, nails breaking. pic above Neuro: nonfocal, well oriented, appropriate affect Breasts: Deferred   RADIOLOGY DATA   CT chest, abdomen and pelvis 10/26/2013:   1. Postoperative changes of recent right breast surgery with packed open wound which has a thick rind of soft tissue, some of which  enhances, a 1.3  x 1.5 cm enhancing lesion in the medial aspect of the right breast which may represent residual tumor or a malignant  lymph node, and multiple enlarged right axillary nodes concerning for axillary nodal metastases.  2. No other definite signs of metastatic disease in the chest, abdomen or pelvis.  3. Multiple small ventral hernias containing several loops of small bowel, as well as the mid transverse colon, without associated bowel  incarceration or obstruction at this time.  4. Trace bilateral pleural effusions with mild dependent subsegmental atelectasis  in the lower lobes of the lungs bilaterally.  5. Status post cholecystectomy and hysterectomy.      LABS: Reviewed.        ASSESSMENT/PLAN: 58 y.o. [place] woman  #1. status post right modified radical mastectomy 10/26/2013 for a cT4, pT3 pN1, stage IIIB invasive ductal carcinoma, grade 3, triple negative, with an MIB-1 of 95% . #2. Initial chemotherapy with dose dense doxorubicin and cyclophosphamide x 4 cycles completed 01/09/2014  #3. carboplatin and paclitaxel weekly x12 doses, started on 01/23/2014. With second week of therapy, she had a mild reaction to Taxol which was adequately treated. Today her 9th week of Taxol is planned. She will get 2 doses of neupogen for white count recovery.  I did a physical exam and checked her labs. She has moderate anemia related to chemotherapy but it is getting better. She started taking iron daily. No transfusion unless hemoglobin is below 8 grams.  #4  postmastectomy radiation to follow after completion of chemotherapy.  #5 We will continue to monitor her labs weekly and do a toxicity screening and a visit with a provider. We gave a script for Tramadol today.   #6 RTC next week for week 10 of carboplatin and taxol.     Bernadene Bell, MD Medical Hematologist/Oncologist [place] Pager: [phone] Office No: [phone]

## 2014-04-10 NOTE — Patient Instructions (Signed)
Carmen Stone Discharge Instructions for Patients Receiving Chemotherapy  Today you received the following chemotherapy agents: Taxol, Carboplatin  To help prevent nausea and vomiting after your treatment, we encourage you to take your nausea medication as prescribed by your physician.   If you develop nausea and vomiting that is not controlled by your nausea medication, call the clinic.   BELOW ARE SYMPTOMS THAT SHOULD BE REPORTED IMMEDIATELY:  *FEVER GREATER THAN 100.5 F  *CHILLS WITH OR WITHOUT FEVER  NAUSEA AND VOMITING THAT IS NOT CONTROLLED WITH YOUR NAUSEA MEDICATION  *UNUSUAL SHORTNESS OF BREATH  *UNUSUAL BRUISING OR BLEEDING  TENDERNESS IN MOUTH AND THROAT WITH OR WITHOUT PRESENCE OF ULCERS  *URINARY PROBLEMS  *BOWEL PROBLEMS  UNUSUAL RASH Items with * indicate a potential emergency and should be followed up as soon as possible.  Feel free to call the clinic you have any questions or concerns. The clinic phone number is [phone].

## 2014-04-10 NOTE — Telephone Encounter (Signed)
Pt return needing to add inj for 10/27 & 10/28. Gave new Avs & cal for Oct & Nov.

## 2014-04-11 ENCOUNTER — Ambulatory Visit (HOSPITAL_BASED_OUTPATIENT_CLINIC_OR_DEPARTMENT_OTHER): Payer: BC Managed Care – PPO

## 2014-04-11 DIAGNOSIS — Z5189 Encounter for other specified aftercare: Secondary | ICD-10-CM

## 2014-04-11 DIAGNOSIS — C50911 Malignant neoplasm of unspecified site of right female breast: Secondary | ICD-10-CM

## 2014-04-11 MED ORDER — TBO-FILGRASTIM 300 MCG/0.5ML ~~LOC~~ SOSY
300.0000 ug | PREFILLED_SYRINGE | Freq: Once | SUBCUTANEOUS | Status: AC
  Administered 2014-04-11: 300 ug via SUBCUTANEOUS
  Filled 2014-04-11: qty 0.5

## 2014-04-11 NOTE — Patient Instructions (Signed)

## 2014-04-12 ENCOUNTER — Ambulatory Visit (HOSPITAL_BASED_OUTPATIENT_CLINIC_OR_DEPARTMENT_OTHER): Payer: BC Managed Care – PPO

## 2014-04-12 VITALS — BP 126/64 | HR 96 | Temp 98.5°F

## 2014-04-12 DIAGNOSIS — C50911 Malignant neoplasm of unspecified site of right female breast: Secondary | ICD-10-CM

## 2014-04-12 DIAGNOSIS — Z5189 Encounter for other specified aftercare: Secondary | ICD-10-CM

## 2014-04-12 MED ORDER — TBO-FILGRASTIM 300 MCG/0.5ML ~~LOC~~ SOSY
300.0000 ug | PREFILLED_SYRINGE | Freq: Once | SUBCUTANEOUS | Status: AC
  Administered 2014-04-12: 300 ug via SUBCUTANEOUS
  Filled 2014-04-12: qty 0.5

## 2014-04-12 NOTE — Patient Instructions (Signed)

## 2014-04-13 ENCOUNTER — Encounter: Payer: Self-pay | Admitting: *Deleted

## 2014-04-13 NOTE — Progress Notes (Signed)
[place] Work  Clinical Social Work met with pt to assist with completing [place] form and fax to [place] with needed tax return. Pt reports she was approved for assistance through [employer] in [place] and plans to bring in medical bills that she wants assistance with. Pt still needs to bring in additional forms for the [facility] and she plans to do so next week. Pt very appreciative and hopeful after being approved for assistance!  Clinical Social Work interventions: Resource assistance Pt advocacy  Loren Racer, [place] Worker [place]  [place] Phone: [phone] Fax: [phone]

## 2014-04-17 ENCOUNTER — Telehealth: Payer: Self-pay | Admitting: Hematology

## 2014-04-17 ENCOUNTER — Other Ambulatory Visit: Payer: Self-pay | Admitting: Hematology

## 2014-04-17 ENCOUNTER — Ambulatory Visit (HOSPITAL_BASED_OUTPATIENT_CLINIC_OR_DEPARTMENT_OTHER): Payer: BC Managed Care – PPO

## 2014-04-17 ENCOUNTER — Other Ambulatory Visit: Payer: Self-pay | Admitting: *Deleted

## 2014-04-17 ENCOUNTER — Other Ambulatory Visit (HOSPITAL_BASED_OUTPATIENT_CLINIC_OR_DEPARTMENT_OTHER): Payer: BC Managed Care – PPO

## 2014-04-17 VITALS — BP 114/57 | HR 86 | Temp 98.1°F | Resp 18

## 2014-04-17 DIAGNOSIS — C50911 Malignant neoplasm of unspecified site of right female breast: Secondary | ICD-10-CM

## 2014-04-17 DIAGNOSIS — Z452 Encounter for adjustment and management of vascular access device: Secondary | ICD-10-CM

## 2014-04-17 DIAGNOSIS — Z5111 Encounter for antineoplastic chemotherapy: Secondary | ICD-10-CM

## 2014-04-17 DIAGNOSIS — C50919 Malignant neoplasm of unspecified site of unspecified female breast: Secondary | ICD-10-CM

## 2014-04-17 LAB — COMPREHENSIVE METABOLIC PANEL (CC13)
ALBUMIN: 3.5 g/dL (ref 3.5–5.0)
ALT: 13 U/L (ref 0–55)
AST: 12 U/L (ref 5–34)
Alkaline Phosphatase: 96 U/L (ref 40–150)
Anion Gap: 5 mEq/L (ref 3–11)
BUN: 11.3 mg/dL (ref 7.0–26.0)
CO2: 27 mEq/L (ref 22–29)
Calcium: 8.8 mg/dL (ref 8.4–10.4)
Chloride: 108 mEq/L (ref 98–109)
Creatinine: 0.7 mg/dL (ref 0.6–1.1)
GLUCOSE: 108 mg/dL (ref 70–140)
POTASSIUM: 4.1 meq/L (ref 3.5–5.1)
Sodium: 141 mEq/L (ref 136–145)
Total Bilirubin: <0.2 mg/dL (ref 0.20–1.20)
Total Protein: 6.1 g/dL — ABNORMAL LOW (ref 6.4–8.3)

## 2014-04-17 LAB — CBC WITH DIFFERENTIAL/PLATELET
BASO%: 1 % (ref 0.0–2.0)
BASOS ABS: 0 10*3/uL (ref 0.0–0.1)
EOS ABS: 0.1 10*3/uL (ref 0.0–0.5)
EOS%: 2 % (ref 0.0–7.0)
HEMATOCRIT: 29.7 % — AB (ref 34.8–46.6)
HEMOGLOBIN: 10.1 g/dL — AB (ref 11.6–15.9)
LYMPH%: 31.1 % (ref 14.0–49.7)
MCH: 32.5 pg (ref 25.1–34.0)
MCHC: 34 g/dL (ref 31.5–36.0)
MCV: 95.5 fL (ref 79.5–101.0)
MONO#: 0.5 10*3/uL (ref 0.1–0.9)
MONO%: 11.9 % (ref 0.0–14.0)
NEUT%: 54 % (ref 38.4–76.8)
NEUTROS ABS: 2.1 10*3/uL (ref 1.5–6.5)
PLATELETS: 202 10*3/uL (ref 145–400)
RBC: 3.11 10*6/uL — ABNORMAL LOW (ref 3.70–5.45)
RDW: 14.6 % — ABNORMAL HIGH (ref 11.2–14.5)
WBC: 4 10*3/uL (ref 3.9–10.3)
lymph#: 1.2 10*3/uL (ref 0.9–3.3)

## 2014-04-17 MED ORDER — FAMOTIDINE IN NACL 20-0.9 MG/50ML-% IV SOLN
20.0000 mg | Freq: Once | INTRAVENOUS | Status: AC
  Administered 2014-04-17: 20 mg via INTRAVENOUS

## 2014-04-17 MED ORDER — HEPARIN SOD (PORK) LOCK FLUSH 100 UNIT/ML IV SOLN
500.0000 [IU] | Freq: Once | INTRAVENOUS | Status: AC | PRN
  Administered 2014-04-17: 500 [IU]
  Filled 2014-04-17: qty 5

## 2014-04-17 MED ORDER — SODIUM CHLORIDE 0.9 % IV SOLN
229.0000 mg | Freq: Once | INTRAVENOUS | Status: AC
  Administered 2014-04-17: 230 mg via INTRAVENOUS
  Filled 2014-04-17: qty 23

## 2014-04-17 MED ORDER — FILGRASTIM 300 MCG/0.5ML IJ SOSY: 300.0000 ug | PREFILLED_SYRINGE | Freq: Once | INTRAMUSCULAR | Status: DC

## 2014-04-17 MED ORDER — PACLITAXEL CHEMO INJECTION 300 MG/50ML
80.0000 mg/m2 | Freq: Once | INTRAVENOUS | Status: AC
  Administered 2014-04-17: 150 mg via INTRAVENOUS
  Filled 2014-04-17: qty 25

## 2014-04-17 MED ORDER — ALTEPLASE 2 MG IJ SOLR
2.0000 mg | Freq: Once | INTRAMUSCULAR | Status: AC
  Administered 2014-04-17: 2 mg
  Filled 2014-04-17: qty 2

## 2014-04-17 MED ORDER — ONDANSETRON 16 MG/50ML IVPB (CHCC)
16.0000 mg | Freq: Once | INTRAVENOUS | Status: AC
  Administered 2014-04-17: 16 mg via INTRAVENOUS

## 2014-04-17 MED ORDER — SODIUM CHLORIDE 0.9 % IJ SOLN
10.0000 mL | INTRAMUSCULAR | Status: DC | PRN
  Administered 2014-04-17: 10 mL
  Filled 2014-04-17: qty 10

## 2014-04-17 MED ORDER — DEXAMETHASONE SODIUM PHOSPHATE 20 MG/5ML IJ SOLN
20.0000 mg | Freq: Once | INTRAMUSCULAR | Status: AC
  Administered 2014-04-17: 20 mg via INTRAVENOUS

## 2014-04-17 MED ORDER — SODIUM CHLORIDE 0.9 % IV SOLN
Freq: Once | INTRAVENOUS | Status: AC
  Administered 2014-04-17: 15:00:00 via INTRAVENOUS

## 2014-04-17 MED ORDER — DIPHENHYDRAMINE HCL 25 MG PO CAPS
ORAL_CAPSULE | ORAL | Status: AC
  Filled 2014-04-17: qty 1

## 2014-04-17 MED ORDER — DIPHENHYDRAMINE HCL 50 MG/ML IJ SOLN
INTRAMUSCULAR | Status: AC
  Filled 2014-04-17: qty 1

## 2014-04-17 MED ORDER — DIPHENHYDRAMINE HCL 50 MG/ML IJ SOLN
25.0000 mg | Freq: Once | INTRAMUSCULAR | Status: AC
  Administered 2014-04-17: 25 mg via INTRAVENOUS

## 2014-04-17 NOTE — Telephone Encounter (Signed)
PT GIVEN APPT SCHEDULE FOR NOV BY [facility] WHILE IN INF. PER ADDED INJ APPTS FOR 11/3 AND 11/4 IN ADDITION TO 1 WEEK LB/FU/CHEMO.

## 2014-04-17 NOTE — Patient Instructions (Signed)
Carmen Stone Discharge Instructions for Patients Receiving Chemotherapy  Today you received the following chemotherapy agents Taxol, Carboplatin  To help prevent nausea and vomiting after your treatment, we encourage you to take your nausea medication as directed.   If you develop nausea and vomiting that is not controlled by your nausea medication, call the clinic.   BELOW ARE SYMPTOMS THAT SHOULD BE REPORTED IMMEDIATELY:  *FEVER GREATER THAN 100.5 F  *CHILLS WITH OR WITHOUT FEVER  NAUSEA AND VOMITING THAT IS NOT CONTROLLED WITH YOUR NAUSEA MEDICATION  *UNUSUAL SHORTNESS OF BREATH  *UNUSUAL BRUISING OR BLEEDING  TENDERNESS IN MOUTH AND THROAT WITH OR WITHOUT PRESENCE OF ULCERS  *URINARY PROBLEMS  *BOWEL PROBLEMS  UNUSUAL RASH Items with * indicate a potential emergency and should be followed up as soon as possible.  Feel free to call the clinic you have any questions or concerns. The clinic phone number is [phone].

## 2014-04-18 ENCOUNTER — Ambulatory Visit (HOSPITAL_BASED_OUTPATIENT_CLINIC_OR_DEPARTMENT_OTHER): Payer: BC Managed Care – PPO

## 2014-04-18 DIAGNOSIS — C50911 Malignant neoplasm of unspecified site of right female breast: Secondary | ICD-10-CM

## 2014-04-18 DIAGNOSIS — Z171 Estrogen receptor negative status [ER-]: Secondary | ICD-10-CM

## 2014-04-18 DIAGNOSIS — C50919 Malignant neoplasm of unspecified site of unspecified female breast: Secondary | ICD-10-CM

## 2014-04-18 MED ORDER — TBO-FILGRASTIM 300 MCG/0.5ML ~~LOC~~ SOSY
300.0000 ug | PREFILLED_SYRINGE | Freq: Once | SUBCUTANEOUS | Status: AC
  Administered 2014-04-18: 300 ug via SUBCUTANEOUS
  Filled 2014-04-18: qty 0.5

## 2014-04-18 NOTE — Patient Instructions (Signed)

## 2014-04-19 ENCOUNTER — Ambulatory Visit (HOSPITAL_BASED_OUTPATIENT_CLINIC_OR_DEPARTMENT_OTHER): Payer: BC Managed Care – PPO

## 2014-04-19 DIAGNOSIS — Z5189 Encounter for other specified aftercare: Secondary | ICD-10-CM

## 2014-04-19 DIAGNOSIS — C50911 Malignant neoplasm of unspecified site of right female breast: Secondary | ICD-10-CM

## 2014-04-19 MED ORDER — TBO-FILGRASTIM 300 MCG/0.5ML ~~LOC~~ SOSY
300.0000 ug | PREFILLED_SYRINGE | Freq: Once | SUBCUTANEOUS | Status: AC
  Administered 2014-04-19: 300 ug via SUBCUTANEOUS
  Filled 2014-04-19: qty 0.5

## 2014-04-19 NOTE — Patient Instructions (Signed)

## 2014-04-24 ENCOUNTER — Other Ambulatory Visit (HOSPITAL_BASED_OUTPATIENT_CLINIC_OR_DEPARTMENT_OTHER): Payer: BC Managed Care – PPO

## 2014-04-24 ENCOUNTER — Ambulatory Visit (HOSPITAL_BASED_OUTPATIENT_CLINIC_OR_DEPARTMENT_OTHER): Payer: BC Managed Care – PPO | Admitting: Hematology

## 2014-04-24 ENCOUNTER — Telehealth: Payer: Self-pay | Admitting: Hematology

## 2014-04-24 ENCOUNTER — Ambulatory Visit (HOSPITAL_BASED_OUTPATIENT_CLINIC_OR_DEPARTMENT_OTHER): Payer: BC Managed Care – PPO

## 2014-04-24 VITALS — BP 136/62 | HR 83 | Temp 98.7°F | Resp 18 | Ht 64.0 in | Wt 165.9 lb

## 2014-04-24 DIAGNOSIS — Z171 Estrogen receptor negative status [ER-]: Secondary | ICD-10-CM

## 2014-04-24 DIAGNOSIS — C50911 Malignant neoplasm of unspecified site of right female breast: Secondary | ICD-10-CM

## 2014-04-24 DIAGNOSIS — N611 Abscess of the breast and nipple: Secondary | ICD-10-CM

## 2014-04-24 DIAGNOSIS — C50919 Malignant neoplasm of unspecified site of unspecified female breast: Secondary | ICD-10-CM

## 2014-04-24 DIAGNOSIS — Z5111 Encounter for antineoplastic chemotherapy: Secondary | ICD-10-CM

## 2014-04-24 LAB — CBC & DIFF AND RETIC
BASO%: 1.4 % (ref 0.0–2.0)
BASOS ABS: 0.1 10*3/uL (ref 0.0–0.1)
EOS%: 1.4 % (ref 0.0–7.0)
Eosinophils Absolute: 0.1 10*3/uL (ref 0.0–0.5)
HCT: 29.6 % — ABNORMAL LOW (ref 34.8–46.6)
HEMOGLOBIN: 9.7 g/dL — AB (ref 11.6–15.9)
IMMATURE RETIC FRACT: 4.6 % (ref 1.60–10.00)
LYMPH%: 33.6 % (ref 14.0–49.7)
MCH: 31.5 pg (ref 25.1–34.0)
MCHC: 32.8 g/dL (ref 31.5–36.0)
MCV: 96.1 fL (ref 79.5–101.0)
MONO#: 0.6 10*3/uL (ref 0.1–0.9)
MONO%: 17.2 % — ABNORMAL HIGH (ref 0.0–14.0)
NEUT#: 1.7 10*3/uL (ref 1.5–6.5)
NEUT%: 46.4 % (ref 38.4–76.8)
Platelets: 190 10*3/uL (ref 145–400)
RBC: 3.08 10*6/uL — ABNORMAL LOW (ref 3.70–5.45)
RDW: 14.9 % — ABNORMAL HIGH (ref 11.2–14.5)
Retic %: 1.09 % (ref 0.70–2.10)
Retic Ct Abs: 33.57 10*3/uL — ABNORMAL LOW (ref 33.70–90.70)
WBC: 3.6 10*3/uL — ABNORMAL LOW (ref 3.9–10.3)
lymph#: 1.2 10*3/uL (ref 0.9–3.3)

## 2014-04-24 LAB — COMPREHENSIVE METABOLIC PANEL (CC13)
ALT: 11 U/L (ref 0–55)
AST: 13 U/L (ref 5–34)
Albumin: 3.6 g/dL (ref 3.5–5.0)
Alkaline Phosphatase: 91 U/L (ref 40–150)
Anion Gap: 6 mEq/L (ref 3–11)
BUN: 10.5 mg/dL (ref 7.0–26.0)
CHLORIDE: 107 meq/L (ref 98–109)
CO2: 28 mEq/L (ref 22–29)
CREATININE: 0.7 mg/dL (ref 0.6–1.1)
Calcium: 8.8 mg/dL (ref 8.4–10.4)
Glucose: 130 mg/dl (ref 70–140)
Potassium: 4 mEq/L (ref 3.5–5.1)
Sodium: 141 mEq/L (ref 136–145)
Total Bilirubin: 0.2 mg/dL (ref 0.20–1.20)
Total Protein: 6 g/dL — ABNORMAL LOW (ref 6.4–8.3)

## 2014-04-24 MED ORDER — SODIUM CHLORIDE 0.9 % IJ SOLN
10.0000 mL | INTRAMUSCULAR | Status: DC | PRN
  Administered 2014-04-24: 10 mL
  Filled 2014-04-24: qty 10

## 2014-04-24 MED ORDER — FAMOTIDINE IN NACL 20-0.9 MG/50ML-% IV SOLN
20.0000 mg | Freq: Once | INTRAVENOUS | Status: AC
  Administered 2014-04-24: 20 mg via INTRAVENOUS

## 2014-04-24 MED ORDER — PACLITAXEL CHEMO INJECTION 300 MG/50ML
80.0000 mg/m2 | Freq: Once | INTRAVENOUS | Status: AC
  Administered 2014-04-24: 150 mg via INTRAVENOUS
  Filled 2014-04-24: qty 25

## 2014-04-24 MED ORDER — TRAMADOL HCL 50 MG PO TABS: 50.0000 mg | ORAL_TABLET | Freq: Four times a day (QID) | ORAL | Status: DC | PRN

## 2014-04-24 MED ORDER — DIPHENHYDRAMINE HCL 50 MG/ML IJ SOLN
INTRAMUSCULAR | Status: AC
  Filled 2014-04-24: qty 1

## 2014-04-24 MED ORDER — DEXAMETHASONE SODIUM PHOSPHATE 20 MG/5ML IJ SOLN
20.0000 mg | Freq: Once | INTRAMUSCULAR | Status: AC
  Administered 2014-04-24: 20 mg via INTRAVENOUS

## 2014-04-24 MED ORDER — DIPHENHYDRAMINE HCL 50 MG/ML IJ SOLN
25.0000 mg | Freq: Once | INTRAMUSCULAR | Status: AC
  Administered 2014-04-24: 25 mg via INTRAVENOUS

## 2014-04-24 MED ORDER — ONDANSETRON 16 MG/50ML IVPB (CHCC)
16.0000 mg | Freq: Once | INTRAVENOUS | Status: AC
  Administered 2014-04-24: 16 mg via INTRAVENOUS

## 2014-04-24 MED ORDER — HEPARIN SOD (PORK) LOCK FLUSH 100 UNIT/ML IV SOLN
500.0000 [IU] | Freq: Once | INTRAVENOUS | Status: AC | PRN
  Administered 2014-04-24: 500 [IU]
  Filled 2014-04-24: qty 5

## 2014-04-24 MED ORDER — SODIUM CHLORIDE 0.9 % IV SOLN
Freq: Once | INTRAVENOUS | Status: AC
  Administered 2014-04-24: 13:00:00 via INTRAVENOUS

## 2014-04-24 MED ORDER — LIDOCAINE-PRILOCAINE 2.5-2.5 % EX CREA: 1.0000 "application " | TOPICAL_CREAM | CUTANEOUS | Status: AC | PRN

## 2014-04-24 MED ORDER — DEXAMETHASONE SODIUM PHOSPHATE 20 MG/5ML IJ SOLN
INTRAMUSCULAR | Status: AC
  Filled 2014-04-24: qty 5

## 2014-04-24 MED ORDER — CARBOPLATIN CHEMO INJECTION 450 MG/45ML
229.0000 mg | Freq: Once | INTRAVENOUS | Status: AC
  Administered 2014-04-24: 230 mg via INTRAVENOUS
  Filled 2014-04-24: qty 23

## 2014-04-24 NOTE — Patient Instructions (Signed)
[facility] Discharge Instructions for Patients Receiving Chemotherapy  Today you received the following chemotherapy agents Taxol, Carboplatin  To help prevent nausea and vomiting after your treatment, we encourage you to take your nausea medication as directed.   If you develop nausea and vomiting that is not controlled by your nausea medication, call the clinic.   BELOW ARE SYMPTOMS THAT SHOULD BE REPORTED IMMEDIATELY:  *FEVER GREATER THAN 100.5 F  *CHILLS WITH OR WITHOUT FEVER  NAUSEA AND VOMITING THAT IS NOT CONTROLLED WITH YOUR NAUSEA MEDICATION  *UNUSUAL SHORTNESS OF BREATH  *UNUSUAL BRUISING OR BLEEDING  TENDERNESS IN MOUTH AND THROAT WITH OR WITHOUT PRESENCE OF ULCERS  *URINARY PROBLEMS  *BOWEL PROBLEMS  UNUSUAL RASH Items with * indicate a potential emergency and should be followed up as soon as possible.  Feel free to call the clinic you have any questions or concerns. The clinic phone number is [phone].

## 2014-04-24 NOTE — Telephone Encounter (Signed)
Gave avs & cal for Nov. Sent mess to [facility]

## 2014-04-24 NOTE — Progress Notes (Signed)
[place]  Telephone:(336) [phone] Fax:(336) [phone]  Clinic Follow up Note 04/24/2014    ID: Carmen Stone OB: 24-May-1956  MR#: [id number]  OBS#:[id number]  PCP: [name], MD SU: [name] MD  CHIEF COMPLAINT: Triple negative breast cancer, locally advanced   CURRENT TREATMENT: Adjuvant chemotherapy. Patient on Carboplatin and Taxol weekly regimen. Week 11 or cycle 11 today.   BREAST CANCER HISTORY: As per previously dictated Dr.Magrinat's note:  Ms. Collison presented to the emergency room on 10/22/2013 complaining of right breast pain and discharge. On exam the breast was diffusely erythematous with" orange peel" appearance. She was admitted for debridement and surgery he describes a skin defect in the lower inner quadrant measuring approximately 4 cm. When the breast was breast, air came out through the hole--there are photos included with the admission note. A chest x-ray was obtained which showed the right breast to be asymmetrically enlarged with extensive subcutaneous gas.  On the same day the patient underwent debridement of the wound, with the pathology (SZA 15-2010) showing high-grade carcinoma involving ulcerated skin. There was tumor within the dermal and subcutaneous lymphatics. The tumor was negative for estrogen, progesterone, and HER-2(signals ratio 1.24, number per cell 3.6), with an MIB-1 of 95%. Gross cystic disease fluid protein was also negative.  The patient was admitted and started on antibiotics. On 10/24/2013 an attempt was made at further debridement, but the inside of the breast essentially came out in hand falls. The pathology from this procedure(SZA 15-20 and 26) again showed high-grade carcinoma.  On 10/25/2013 the patient underwent CT scans of the chest abdomen and pelvis. Aside from disease in the breast and right axilla there was no evidence of metastatic disease.     Accordingly Dr. [name] proceeded to right  mastectomy with axillary lymph node dissection 10/26/2013. Results of this procedure (SZA [id number]) showed a 15 cm defect in the middle of the breast, which is 4 prior necrotic tissue had been scoped out. There was nevertheless extensive invasive ductal carcinoma, grade 3, in the specimen and repeat or blastic panel was again triple negative. 2 of 16 axillary lymph nodes were involved by macrometastatic deposits. The patient was continued on antibiotics and eventually discharged 11/03/2013.  Initial chemotherapy with dose dense doxorubicin and cyclophosphamide x 4 cycles completed 01/09/2014. Now plan is to give carboplatin and paclitaxel weekly x12 doses, today is week 8, and postmastectomy radiation to follow after completion of chemotherapy.  INTERVAL HISTORY:  Carmen Stone returns today for followup of her breast cancer on chemotherapy. She has completed chemotherapy with cyclophosphamide and doxorubicin and received the fourth and final cycle. She got 8 cycles of Carboplatin and Taxol. Patient is doing well. She has alopecia complete now from [facility] chemo. She now gets neupogen shots for 2 days after each week of carboplatin and taxol. Her nails have started to come off. She had grade 1 neuropathy and voices no new symptoms. She was given a script for tramadol and emla cream today. Rest of the ROS is negative.   PAST MEDICAL HISTORY: Past Medical History  Diagnosis Date  . Diabetes mellitus, type II   . Inflammatory breast cancer     PAST SURGICAL HISTORY:q2`  Past Surgical History  Procedure Laterality Date  . Cesarean section    . Ventral hernia repair  2011    repair incacerated VH with biologic mesh; cholecystectomy  . Cholecystectomy  2011  . Incision and drainage of wound Right 10/22/2013    Procedure: IRRIGATION AND DRAINAGE  AND DEBRIDEMENT RIGHT BREAST WOUND;  Surgeon: [name], MD;  Location: [place];  Service: General;  Laterality: Right;  . Breast biopsy Right 10/22/2013    Procedure:  BREAST BIOPSY;  Surgeon: [name], MD;  Location: [place];  Service: General;  Laterality: Right;  . Irrigation and debridement abscess Right 10/24/2013    Procedure: Right total mastectomy;  Surgeon: [name], MD;  Location: [place];  Service: General;  Laterality: Right;  . Dressing change under anesthesia Right 10/26/2013    Procedure: COMPLETION MASTECTOMY, AXILLARY DISSECTION;  Surgeon: [name], MD;  Location: [place];  Service: General;  Laterality: Right;  . Portacath placement N/A 11/16/2013    Procedure: INSERTION PORT-A-CATH;  Surgeon: [name], MD;  Location: [place];  Service: General;  Laterality: N/A;    FAMILY HISTORY History reviewed. No pertinent family history. The patient's father was a heavy smoker and died from complications of emphysema at the age of 69. The patient's mother died from lung cancer at the age of 56. She also was a heavy smoker. The patient had one brother, no sisters. There is no history of breast or ovarian cancer in the family.  GYNECOLOGIC HISTORY:  Menarche age 45, first live birth age 31, the patient is GX P1. She went through the change of life approximately age 37. She did not take hormone replacement.  SOCIAL HISTORY:  Carmen Stone is divorced. She works for [employer] and also works a second job at [employer] on [employer]. She plans to be on disability during her chemoradiation treatments. She lives alone, but her daughter [name] frequently stays there overnight. [name] was [employer]. 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 She spends her time between helping her mom and helping her grandfather, Cristol Engdahl, who lives in [place] and has significant dementia. Mr. Macdonnell was present at the 11/18/2013 visit only because [name] wanted to be present and otherwise no one would have been with Mr. Bulow. Carmen Stone has no grandchildren. She attends a Nurse, adult church.   ADVANCED DIRECTIVES:  In place. The patient has named her brother Clide Cliff as her healthcare power of attorney. Iona Beard can be reached at [phone]. He is a Pharmacist, hospital locally  HEALTH MAINTENANCE: History  Substance Use Topics  . Smoking status: Never Smoker   . Smokeless tobacco: Not on file  . Alcohol Use: No     Colonoscopy:  PAP:  Bone density:  Lipid panel:  No Known Allergies  Current Outpatient Prescriptions  Medication Sig Dispense Refill  . acetaminophen (TYLENOL) 325 MG tablet Take 650 mg by mouth every 6 (six) hours as needed for mild pain.    Marland Kitchen dexamethasone (DECADRON) 4 MG tablet Take 2 tablets by mouth once a day on the day after chemotherapy and then take 2 tablets two times a day for 2 days. Take with food. 30 tablet 1  . Diphenhyd-Hydrocort-Nystatin (FIRST-DUKES MOUTHWASH) SUSP Swish and spit 5 mls by mouth every 6 hours for mouth sores and use before each meal. 120 mL 1  . glucose blood test strip Use as instructed 100 each 12  . glucose monitoring kit (FREESTYLE) monitoring kit 1 each by Does not apply route as needed for other. Check blood sugars daily. 1 each 0  . Insulin Glargine (LANTUS SOLOSTAR) 100 UNIT/ML Solostar Pen Inject 10 Units into the skin daily at 10 pm. 15 mL 3  . lidocaine-prilocaine (EMLA) cream Apply 1 application topically as needed. 30 g  0  . linagliptin (TRADJENTA) 5 MG TABS tablet Take 1 tablet (5 mg total) by mouth daily. 30 tablet 0  . LORazepam (ATIVAN) 0.5 MG tablet Take 1 tablet (0.5 mg total) by mouth at bedtime as needed (Nausea or vomiting). 30 tablet 0  . ondansetron (ZOFRAN) 8 MG tablet Take 1 tablet (8 mg total) by mouth every 8 (eight) hours as needed for nausea or vomiting. 20 tablet 3  . pantoprazole (PROTONIX) 40 MG tablet Take 1 tablet (40 mg total) by mouth daily at 6 (six) AM. 30 tablet 3  . traMADol (ULTRAM) 50 MG tablet Take 1 tablet (50 mg total) by mouth every 6 (six) hours as needed. 30 tablet 0   No current facility-administered  medications for this visit.    OBJECTIVE: Middle-aged [place] American woman who appears stated age 58 Vitals:   04/24/14 1147  BP: 136/62  Pulse: 83  Temp: 98.7 F (37.1 C)  Resp: 18     Body mass index is 28.46 kg/(m^2).    ECOG FS:1  Sclerae unicteric, pupils equal and reactive Oropharynx clear and moist, no thrush or mucositis No cervical or supraclavicular adenopathy Lungs no rales or rhonchi Heart regular rate and rhythm Abd soft, nontender, positive bowel sounds MSK no focal spinal tenderness, no upper extremity lymphedema, nails breaking. pic above Neuro: nonfocal, well oriented, appropriate affect Breasts: Deferred   RADIOLOGY DATA   CT chest, abdomen and pelvis 10/26/2013:   1. Postoperative changes of recent right breast surgery with packed open wound which has a thick rind of soft tissue, some of which  enhances, a 1.3 x 1.5 cm enhancing lesion in the medial aspect of the right breast which may represent residual tumor or a malignant  lymph node, and multiple enlarged right axillary nodes concerning for axillary nodal metastases.  2. No other definite signs of metastatic disease in the chest, abdomen or pelvis.  3. Multiple small ventral hernias containing several loops of small bowel, as well as the mid transverse colon, without associated bowel  incarceration or obstruction at this time.  4. Trace bilateral pleural effusions with mild dependent subsegmental atelectasis in the lower lobes of the lungs bilaterally.  5. Status post cholecystectomy and hysterectomy.      LABS: Reviewed.        ASSESSMENT/PLAN: 58 y.o. [place] woman  #1. status post right modified radical mastectomy 10/26/2013 for a cT4, pT3 pN1, stage IIIB invasive ductal carcinoma, grade 3, triple negative, with an MIB-1 of 95% . #2. Initial chemotherapy with dose dense doxorubicin and cyclophosphamide x 4 cycles completed 01/09/2014  #3. carboplatin and paclitaxel weekly x12  doses, started on 01/23/2014. With second week of therapy, she had a mild reaction to Taxol which was adequately treated. Today her 11th week of Taxol is planned. She will get 2 doses of neupogen for white count recovery.  I did a physical exam and checked her labs. She has moderate anemia related to chemotherapy but it is getting better. She started taking iron daily. No transfusion unless hemoglobin is below 8 grams.  #4  postmastectomy radiation to follow after completion of chemotherapy.  #5 We will continue to monitor her labs weekly and do a toxicity screening and a visit with a provider. We gave a script for Tramadol today and EMLA cream.  #6 RTC next week for week 12 of carboplatin and taxol and MD visit.     Bernadene Bell, MD Medical Hematologist/Oncologist [place] Pager: [phone] Office No:  [phone] 

## 2014-04-25 ENCOUNTER — Telehealth: Payer: Self-pay | Admitting: *Deleted

## 2014-04-25 ENCOUNTER — Ambulatory Visit (HOSPITAL_BASED_OUTPATIENT_CLINIC_OR_DEPARTMENT_OTHER): Payer: BC Managed Care – PPO

## 2014-04-25 DIAGNOSIS — C50911 Malignant neoplasm of unspecified site of right female breast: Secondary | ICD-10-CM

## 2014-04-25 DIAGNOSIS — Z5189 Encounter for other specified aftercare: Secondary | ICD-10-CM

## 2014-04-25 DIAGNOSIS — C50919 Malignant neoplasm of unspecified site of unspecified female breast: Secondary | ICD-10-CM

## 2014-04-25 MED ORDER — TBO-FILGRASTIM 300 MCG/0.5ML ~~LOC~~ SOSY
300.0000 ug | PREFILLED_SYRINGE | Freq: Once | SUBCUTANEOUS | Status: AC
  Administered 2014-04-25: 300 ug via SUBCUTANEOUS
  Filled 2014-04-25: qty 0.5

## 2014-04-25 MED ORDER — FILGRASTIM 300 MCG/0.5ML IJ SOSY: 300.0000 ug | PREFILLED_SYRINGE | Freq: Once | INTRAMUSCULAR | Status: DC

## 2014-04-25 NOTE — Telephone Encounter (Signed)
Per staff message and POF I have scheduled appts. Advised scheduler of appts. [name]  

## 2014-04-25 NOTE — Patient Instructions (Signed)

## 2014-04-26 ENCOUNTER — Ambulatory Visit (HOSPITAL_BASED_OUTPATIENT_CLINIC_OR_DEPARTMENT_OTHER): Payer: BC Managed Care – PPO

## 2014-04-26 DIAGNOSIS — Z5189 Encounter for other specified aftercare: Secondary | ICD-10-CM

## 2014-04-26 DIAGNOSIS — C50919 Malignant neoplasm of unspecified site of unspecified female breast: Secondary | ICD-10-CM

## 2014-04-26 DIAGNOSIS — C50911 Malignant neoplasm of unspecified site of right female breast: Secondary | ICD-10-CM

## 2014-04-26 MED ORDER — TBO-FILGRASTIM 300 MCG/0.5ML ~~LOC~~ SOSY
300.0000 ug | PREFILLED_SYRINGE | Freq: Once | SUBCUTANEOUS | Status: AC
  Administered 2014-04-26: 300 ug via SUBCUTANEOUS
  Filled 2014-04-26: qty 0.5

## 2014-04-26 NOTE — Patient Instructions (Signed)

## 2014-05-01 ENCOUNTER — Ambulatory Visit (HOSPITAL_BASED_OUTPATIENT_CLINIC_OR_DEPARTMENT_OTHER): Payer: BC Managed Care – PPO | Admitting: Hematology

## 2014-05-01 ENCOUNTER — Telehealth: Payer: Self-pay | Admitting: Hematology

## 2014-05-01 ENCOUNTER — Encounter: Payer: Self-pay | Admitting: Hematology

## 2014-05-01 ENCOUNTER — Other Ambulatory Visit: Payer: Self-pay

## 2014-05-01 ENCOUNTER — Ambulatory Visit (HOSPITAL_BASED_OUTPATIENT_CLINIC_OR_DEPARTMENT_OTHER): Payer: BC Managed Care – PPO

## 2014-05-01 ENCOUNTER — Other Ambulatory Visit: Payer: Self-pay | Admitting: Oncology

## 2014-05-01 ENCOUNTER — Other Ambulatory Visit (HOSPITAL_BASED_OUTPATIENT_CLINIC_OR_DEPARTMENT_OTHER): Payer: BC Managed Care – PPO

## 2014-05-01 VITALS — BP 159/70 | HR 107 | Temp 98.2°F | Resp 20 | Ht 64.0 in | Wt 160.6 lb

## 2014-05-01 DIAGNOSIS — C50911 Malignant neoplasm of unspecified site of right female breast: Secondary | ICD-10-CM

## 2014-05-01 DIAGNOSIS — N611 Abscess of the breast and nipple: Secondary | ICD-10-CM

## 2014-05-01 DIAGNOSIS — C50919 Malignant neoplasm of unspecified site of unspecified female breast: Secondary | ICD-10-CM

## 2014-05-01 DIAGNOSIS — Z5111 Encounter for antineoplastic chemotherapy: Secondary | ICD-10-CM

## 2014-05-01 DIAGNOSIS — D6481 Anemia due to antineoplastic chemotherapy: Secondary | ICD-10-CM

## 2014-05-01 DIAGNOSIS — Z171 Estrogen receptor negative status [ER-]: Secondary | ICD-10-CM

## 2014-05-01 LAB — CBC WITH DIFFERENTIAL/PLATELET
BASO%: 1.4 % (ref 0.0–2.0)
Basophils Absolute: 0 10*3/uL (ref 0.0–0.1)
EOS%: 1.7 % (ref 0.0–7.0)
Eosinophils Absolute: 0.1 10*3/uL (ref 0.0–0.5)
HCT: 31 % — ABNORMAL LOW (ref 34.8–46.6)
HGB: 10.1 g/dL — ABNORMAL LOW (ref 11.6–15.9)
LYMPH#: 0.8 10*3/uL — AB (ref 0.9–3.3)
LYMPH%: 26.7 % (ref 14.0–49.7)
MCH: 31.6 pg (ref 25.1–34.0)
MCHC: 32.7 g/dL (ref 31.5–36.0)
MCV: 96.9 fL (ref 79.5–101.0)
MONO#: 0.5 10*3/uL (ref 0.1–0.9)
MONO%: 14.6 % — AB (ref 0.0–14.0)
NEUT#: 1.7 10*3/uL (ref 1.5–6.5)
NEUT%: 55.6 % (ref 38.4–76.8)
Platelets: 178 10*3/uL (ref 145–400)
RBC: 3.2 10*6/uL — ABNORMAL LOW (ref 3.70–5.45)
RDW: 16.2 % — ABNORMAL HIGH (ref 11.2–14.5)
WBC: 3.1 10*3/uL — ABNORMAL LOW (ref 3.9–10.3)

## 2014-05-01 LAB — COMPREHENSIVE METABOLIC PANEL (CC13)
ALT: 12 U/L (ref 0–55)
AST: 12 U/L (ref 5–34)
Albumin: 3.7 g/dL (ref 3.5–5.0)
Alkaline Phosphatase: 83 U/L (ref 40–150)
Anion Gap: 6 mEq/L (ref 3–11)
BILIRUBIN TOTAL: 0.3 mg/dL (ref 0.20–1.20)
BUN: 11.4 mg/dL (ref 7.0–26.0)
CHLORIDE: 108 meq/L (ref 98–109)
CO2: 26 mEq/L (ref 22–29)
CREATININE: 0.6 mg/dL (ref 0.6–1.1)
Calcium: 9 mg/dL (ref 8.4–10.4)
Glucose: 138 mg/dl (ref 70–140)
Potassium: 4 mEq/L (ref 3.5–5.1)
Sodium: 140 mEq/L (ref 136–145)
Total Protein: 6.1 g/dL — ABNORMAL LOW (ref 6.4–8.3)

## 2014-05-01 MED ORDER — DIPHENHYDRAMINE HCL 50 MG/ML IJ SOLN
INTRAMUSCULAR | Status: AC
  Filled 2014-05-01: qty 1

## 2014-05-01 MED ORDER — DEXAMETHASONE SODIUM PHOSPHATE 20 MG/5ML IJ SOLN
INTRAMUSCULAR | Status: AC
  Filled 2014-05-01: qty 5

## 2014-05-01 MED ORDER — DEXTROSE 5 % IV SOLN
80.0000 mg/m2 | Freq: Once | INTRAVENOUS | Status: AC
  Administered 2014-05-01: 150 mg via INTRAVENOUS
  Filled 2014-05-01: qty 25

## 2014-05-01 MED ORDER — TRAMADOL HCL 50 MG PO TABS: 50.0000 mg | ORAL_TABLET | Freq: Four times a day (QID) | ORAL | Status: DC | PRN

## 2014-05-01 MED ORDER — DEXAMETHASONE SODIUM PHOSPHATE 20 MG/5ML IJ SOLN
20.0000 mg | Freq: Once | INTRAMUSCULAR | Status: AC
  Administered 2014-05-01: 20 mg via INTRAVENOUS

## 2014-05-01 MED ORDER — DIPHENHYDRAMINE HCL 50 MG/ML IJ SOLN
25.0000 mg | Freq: Once | INTRAMUSCULAR | Status: AC
  Administered 2014-05-01: 25 mg via INTRAVENOUS

## 2014-05-01 MED ORDER — FAMOTIDINE IN NACL 20-0.9 MG/50ML-% IV SOLN
20.0000 mg | Freq: Once | INTRAVENOUS | Status: AC
  Administered 2014-05-01: 20 mg via INTRAVENOUS

## 2014-05-01 MED ORDER — FAMOTIDINE IN NACL 20-0.9 MG/50ML-% IV SOLN
INTRAVENOUS | Status: AC
  Filled 2014-05-01: qty 50

## 2014-05-01 MED ORDER — HEPARIN SOD (PORK) LOCK FLUSH 100 UNIT/ML IV SOLN
500.0000 [IU] | Freq: Once | INTRAVENOUS | Status: AC | PRN
  Administered 2014-05-01: 500 [IU]
  Filled 2014-05-01: qty 5

## 2014-05-01 MED ORDER — SODIUM CHLORIDE 0.9 % IJ SOLN
10.0000 mL | INTRAMUSCULAR | Status: DC | PRN
  Administered 2014-05-01: 10 mL
  Filled 2014-05-01: qty 10

## 2014-05-01 MED ORDER — SODIUM CHLORIDE 0.9 % IV SOLN
229.0000 mg | Freq: Once | INTRAVENOUS | Status: AC
  Administered 2014-05-01: 230 mg via INTRAVENOUS
  Filled 2014-05-01: qty 23

## 2014-05-01 MED ORDER — ONDANSETRON 16 MG/50ML IVPB (CHCC)
16.0000 mg | Freq: Once | INTRAVENOUS | Status: AC
  Administered 2014-05-01: 16 mg via INTRAVENOUS

## 2014-05-01 MED ORDER — ONDANSETRON 16 MG/50ML IVPB (CHCC)
INTRAVENOUS | Status: AC
Start: 2014-05-01 — End: 2014-05-01
  Filled 2014-05-01: qty 16

## 2014-05-01 MED ORDER — SODIUM CHLORIDE 0.9 % IV SOLN
Freq: Once | INTRAVENOUS | Status: AC
  Administered 2014-05-01: 10:00:00 via INTRAVENOUS

## 2014-05-01 NOTE — Telephone Encounter (Signed)
lvm for pt with NOV and DEC appt....mailed pt appt sched/avs and letter for [facility]

## 2014-05-01 NOTE — Progress Notes (Signed)
[place]  Telephone:(336) [phone] Fax:(336) [phone]  Clinic Follow up Note 11//2015    ID: [name] OB: Nov 05, 1955  MR#: [id number]  TTS#:[id number]  PCP: [name], MD SU: [name] MD  CHIEF COMPLAINT: Triple negative breast cancer, locally advanced   CURRENT TREATMENT: Adjuvant chemotherapy. Patient on Carboplatin and Taxol weekly regimen. Week 12/12 or cycle 12/12 today.   BREAST CANCER HISTORY: As per previously dictated Dr.Magrinat's note:  Ms. Patricelli presented to the emergency room on 10/22/2013 complaining of right breast pain and discharge. On exam the breast was diffusely erythematous with" orange peel" appearance. She was admitted for debridement and surgery he describes a skin defect in the lower inner quadrant measuring approximately 4 cm. When the breast was breast, air came out through the hole--there are photos included with the admission note. A chest x-ray was obtained which showed the right breast to be asymmetrically enlarged with extensive subcutaneous gas.  On the same day the patient underwent debridement of the wound, with the pathology (SZA 15-2010) showing high-grade carcinoma involving ulcerated skin. There was tumor within the dermal and subcutaneous lymphatics. The tumor was Triple negative for estrogen, progesterone, and HER-2(signals ratio 1.24, number per cell 3.6), with an MIB-1 of 95%. Gross cystic disease fluid protein was also negative.  The patient was admitted and started on antibiotics. On 10/24/2013 an attempt was made at further debridement, but the inside of the breast essentially came out in hand falls. The pathology from this procedure(SZA 15-20 and 26) again showed high-grade carcinoma.  On 10/25/2013 the patient underwent CT scans of the chest abdomen and pelvis. Aside from disease in the breast and right axilla there was no evidence of metastatic disease.     Accordingly Dr. [name]  proceeded to right mastectomy with axillary lymph node dissection 10/26/2013. Results of this procedure (SZA 15-2093) showed a 15 cm defect in the middle of the breast, which is 4 prior necrotic tissue had been scoped out. There was nevertheless extensive invasive ductal carcinoma, grade 3, in the specimen and repeat or blastic panel was again triple negative. 2 of 16 axillary lymph nodes were involved by macrometastatic deposits. The patient was continued on antibiotics and eventually discharged 11/03/2013.  Initial chemotherapy with dose dense doxorubicin and cyclophosphamide x 4 cycles completed 01/09/2014. Now plan is to give carboplatin and paclitaxel weekly x12 doses, today is week 12, and postmastectomy radiation to follow after completion of chemotherapy. I made a referral to Dr Pablo Ledger today for Radiation.  INTERVAL HISTORY:  Cedricka returns today for followup of her breast cancer on chemotherapy. She has completed chemotherapy with cyclophosphamide and doxorubicin and received the fourth and final cycle. She got 11 cycles of Carboplatin and Taxol. Patient is doing well. She has alopecia complete now from [facility] chemo. She now gets neupogen shots for 2 days after each week of carboplatin and taxol. Her nails have started to come off. She had grade 1-2 neuropathy and voices no new symptoms. She was given a script for tramadol today. Rest of the ROS is negative.   PAST MEDICAL HISTORY: Past Medical History  Diagnosis Date  . Diabetes mellitus, type II   . Inflammatory breast cancer     PAST SURGICAL HISTORY:q2`  Past Surgical History  Procedure Laterality Date  . Cesarean section    . Ventral hernia repair  2011    repair incacerated VH with biologic mesh; cholecystectomy  . Cholecystectomy  2011  . Incision and drainage of wound Right  10/22/2013    Procedure: IRRIGATION AND DRAINAGE AND DEBRIDEMENT RIGHT BREAST WOUND;  Surgeon: [name], MD;  Location: [place];  Service: General;   Laterality: Right;  . Breast biopsy Right 10/22/2013    Procedure: BREAST BIOPSY;  Surgeon: [name], MD;  Location: [place];  Service: General;  Laterality: Right;  . Irrigation and debridement abscess Right 10/24/2013    Procedure: Right total mastectomy;  Surgeon: [name], MD;  Location: [place];  Service: General;  Laterality: Right;  . Dressing change under anesthesia Right 10/26/2013    Procedure: COMPLETION MASTECTOMY, AXILLARY DISSECTION;  Surgeon: [name], MD;  Location: [place];  Service: General;  Laterality: Right;  . Portacath placement N/A 11/16/2013    Procedure: INSERTION PORT-A-CATH;  Surgeon: [name], MD;  Location: [place];  Service: General;  Laterality: N/A;    FAMILY HISTORY History reviewed. No pertinent family history. The patient's father was a heavy smoker and died from complications of emphysema at the age of 29. The patient's mother died from lung cancer at the age of 71. She also was a heavy smoker. The patient had one brother, no sisters. There is no history of breast or ovarian cancer in the family.  GYNECOLOGIC HISTORY:  Menarche age 57, first live birth age 55, the patient is GX P1. She went through the change of life approximately age 52. She did not take hormone replacement.  SOCIAL HISTORY:  Landy is divorced. She works for [employer] and also works a second job at [employer] on [employer]. She plans to be on disability during her chemoradiation treatments. She lives alone, but her daughter [name] frequently stays there overnight. [name] was [employer]. 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 She spends her time between helping her mom and helping her grandfather, Lovell Roe, who lives in [place] and has significant dementia. Mr. Godino was present at the 11/18/2013 visit only because [name] wanted to be present and otherwise no one would have been with Mr. Jabbour. Shalita has no  grandchildren. She attends a Nurse, adult church.   ADVANCED DIRECTIVES: In place. The patient has named her brother Clide Cliff as her healthcare power of attorney. Iona Beard can be reached at [phone]. He is a Pharmacist, hospital locally  HEALTH MAINTENANCE: History  Substance Use Topics  . Smoking status: Never Smoker   . Smokeless tobacco: Not on file  . Alcohol Use: No     Colonoscopy:  PAP:  Bone density:  Lipid panel:  No Known Allergies  Current Outpatient Prescriptions  Medication Sig Dispense Refill  . acetaminophen (TYLENOL) 325 MG tablet Take 650 mg by mouth every 6 (six) hours as needed for mild pain.    Marland Kitchen dexamethasone (DECADRON) 4 MG tablet Take 2 tablets by mouth once a day on the day after chemotherapy and then take 2 tablets two times a day for 2 days. Take with food. 30 tablet 1  . Diphenhyd-Hydrocort-Nystatin (FIRST-DUKES MOUTHWASH) SUSP Swish and spit 5 mls by mouth every 6 hours for mouth sores and use before each meal. 120 mL 1  . glucose blood test strip Use as instructed 100 each 12  . glucose monitoring kit (FREESTYLE) monitoring kit 1 each by Does not apply route as needed for other. Check blood sugars daily. 1 each 0  . Insulin Glargine (LANTUS SOLOSTAR) 100 UNIT/ML Solostar Pen Inject 10 Units into the skin daily at 10 pm. 15 mL 3  . lidocaine-prilocaine (EMLA) cream  Apply 1 application topically as needed. 30 g 0  . linagliptin (TRADJENTA) 5 MG TABS tablet Take 1 tablet (5 mg total) by mouth daily. 30 tablet 0  . LORazepam (ATIVAN) 0.5 MG tablet Take 1 tablet (0.5 mg total) by mouth at bedtime as needed (Nausea or vomiting). 30 tablet 0  . ondansetron (ZOFRAN) 8 MG tablet Take 1 tablet (8 mg total) by mouth every 8 (eight) hours as needed for nausea or vomiting. 20 tablet 3  . pantoprazole (PROTONIX) 40 MG tablet Take 1 tablet (40 mg total) by mouth daily at 6 (six) AM. 30 tablet 3  . traMADol (ULTRAM) 50 MG tablet Take 1 tablet (50 mg total) by mouth every 6 (six)  hours as needed. 60 tablet 0   No current facility-administered medications for this visit.   Facility-Administered Medications Ordered in Other Visits  Medication Dose Route Frequency Provider Last Rate Last Dose  . CARBOplatin (PARAPLATIN) 230 mg in sodium chloride 0.9 % 100 mL chemo infusion  230 mg Intravenous Once Alvira Hecht Marla Roe, MD      . heparin lock flush 100 unit/mL  500 Units Intracatheter Once PRN Selina Tapper Marla Roe, MD      . PACLitaxel (TAXOL) 150 mg in dextrose 5 % 250 mL chemo infusion (</= 107m/m2)  80 mg/m2 (Treatment Plan Actual) Intravenous Once Maryon Kemnitz SMarla Roe MD 275 mL/hr at 05/01/14 1028 150 mg at 05/01/14 1028  . sodium chloride 0.9 % injection 10 mL  10 mL Intracatheter PRN Tamara Monteith SMarla Roe MD        OBJECTIVE: Middle-aged [place] woman who appears stated age F51Vitals:   05/01/14 0838  BP: 159/70  Pulse: 107  Temp: 98.2 F (36.8 C)  Resp: 20     Body mass index is 27.55 kg/(m^2).    ECOG FS:1  Sclerae unicteric, pupils equal and reactive Oropharynx clear and moist, no thrush or mucositis No cervical or supraclavicular adenopathy Lungs no rales or rhonchi Heart regular rate and rhythm Abd soft, nontender, positive bowel sounds MSK no focal spinal tenderness, no upper extremity lymphedema, nails breaking. pic above Neuro: nonfocal, well oriented, appropriate affect Breasts: Deferred   RADIOLOGY DATA   CT chest, abdomen and pelvis 10/26/2013:   1. Postoperative changes of recent right breast surgery with packed open wound which has a thick rind of soft tissue, some of which  enhances, a 1.3 x 1.5 cm enhancing lesion in the medial aspect of the right breast which may represent residual tumor or a malignant  lymph node, and multiple enlarged right axillary nodes concerning for axillary nodal metastases.  2. No other definite signs of metastatic disease in the chest, abdomen or pelvis.  3. Multiple small ventral hernias  containing several loops of small bowel, as well as the mid transverse colon, without associated bowel  incarceration or obstruction at this time.  4. Trace bilateral pleural effusions with mild dependent subsegmental atelectasis in the lower lobes of the lungs bilaterally.  5. Status post cholecystectomy and hysterectomy.      LABS: Reviewed.        ASSESSMENT/PLAN: 58y.o. [place] woman  #1. status post right modified radical mastectomy 10/26/2013 for a cT4, pT3 pN1, stage IIIB invasive ductal carcinoma, grade 3, triple negative, with an MIB-1 of 95% . #2. Initial chemotherapy with dose dense doxorubicin and cyclophosphamide x 4 cycles completed 01/09/2014  #3. carboplatin and paclitaxel weekly x12 doses, started on 01/23/2014. With second week of therapy, she had a mild  reaction to Taxol which was adequately treated. Today her 12th week of Taxol is planned. She will get 2 doses of neupogen for white count recovery.  I did a physical exam and checked her labs. She has moderate anemia related to chemotherapy but it is getting better. She started taking iron daily. No transfusion unless hemoglobin is below 8 grams.  #4  postmastectomy radiation to follow after completion of chemotherapy. Referral made to Dr Pablo Ledger in [place].  #5 We will have her return to clinic in 3 weeks to see Dr Burr Medico. Post-completion of XRT, I think it would be reasonable to do a re-staging scan in March 2016 and if negative continue the surveillance closely for first 2 years q 3-4 months, then every 6 months till she is 5 years out and continue mammographic surveillance and labs and tumor markers.  #6 I wished her good luck for the rest of her therapy and for future.     Bernadene Bell, MD Medical Hematologist/Oncologist [place] Pager: [phone] Office No: [phone]

## 2014-05-01 NOTE — Telephone Encounter (Signed)
Pt confirmed labs/ov per 11/16 POF, gave pt AVS.... KJ °

## 2014-05-01 NOTE — Patient Instructions (Signed)
[facility] Discharge Instructions for Patients Receiving Chemotherapy  Today you received the following chemotherapy agents Paclitaxel/Carboplatin.   To help prevent nausea and vomiting after your treatment, we encourage you to take your nausea medication as directed.    If you develop nausea and vomiting that is not controlled by your nausea medication, call the clinic.   BELOW ARE SYMPTOMS THAT SHOULD BE REPORTED IMMEDIATELY:  *FEVER GREATER THAN 100.5 F  *CHILLS WITH OR WITHOUT FEVER  NAUSEA AND VOMITING THAT IS NOT CONTROLLED WITH YOUR NAUSEA MEDICATION  *UNUSUAL SHORTNESS OF BREATH  *UNUSUAL BRUISING OR BLEEDING  TENDERNESS IN MOUTH AND THROAT WITH OR WITHOUT PRESENCE OF ULCERS  *URINARY PROBLEMS  *BOWEL PROBLEMS  UNUSUAL RASH Items with * indicate a potential emergency and should be followed up as soon as possible.  Feel free to call the clinic you have any questions or concerns. The clinic phone number is [phone].    

## 2014-05-02 ENCOUNTER — Ambulatory Visit (HOSPITAL_BASED_OUTPATIENT_CLINIC_OR_DEPARTMENT_OTHER): Payer: BC Managed Care – PPO

## 2014-05-02 DIAGNOSIS — C50911 Malignant neoplasm of unspecified site of right female breast: Secondary | ICD-10-CM

## 2014-05-02 DIAGNOSIS — Z5189 Encounter for other specified aftercare: Secondary | ICD-10-CM

## 2014-05-02 DIAGNOSIS — C50919 Malignant neoplasm of unspecified site of unspecified female breast: Secondary | ICD-10-CM

## 2014-05-02 MED ORDER — TBO-FILGRASTIM 300 MCG/0.5ML ~~LOC~~ SOSY
300.0000 ug | PREFILLED_SYRINGE | Freq: Once | SUBCUTANEOUS | Status: AC
  Administered 2014-05-02: 300 ug via SUBCUTANEOUS
  Filled 2014-05-02: qty 0.5

## 2014-05-02 MED ORDER — FILGRASTIM 300 MCG/0.5ML IJ SOSY: 300.0000 ug | PREFILLED_SYRINGE | Freq: Once | INTRAMUSCULAR | Status: DC

## 2014-05-02 NOTE — Patient Instructions (Signed)

## 2014-05-03 ENCOUNTER — Ambulatory Visit (HOSPITAL_BASED_OUTPATIENT_CLINIC_OR_DEPARTMENT_OTHER): Payer: BC Managed Care – PPO

## 2014-05-03 DIAGNOSIS — Z5189 Encounter for other specified aftercare: Secondary | ICD-10-CM

## 2014-05-03 DIAGNOSIS — C50911 Malignant neoplasm of unspecified site of right female breast: Secondary | ICD-10-CM

## 2014-05-03 DIAGNOSIS — C50919 Malignant neoplasm of unspecified site of unspecified female breast: Secondary | ICD-10-CM

## 2014-05-03 MED ORDER — FILGRASTIM 300 MCG/0.5ML IJ SOSY: 300.0000 ug | PREFILLED_SYRINGE | Freq: Once | INTRAMUSCULAR | Status: DC

## 2014-05-03 MED ORDER — TBO-FILGRASTIM 300 MCG/0.5ML ~~LOC~~ SOSY
300.0000 ug | PREFILLED_SYRINGE | Freq: Once | SUBCUTANEOUS | Status: AC
  Administered 2014-05-03: 300 ug via SUBCUTANEOUS
  Filled 2014-05-03: qty 0.5

## 2014-05-03 NOTE — Patient Instructions (Signed)

## 2014-05-12 ENCOUNTER — Telehealth: Payer: Self-pay | Admitting: Medical Oncology

## 2014-05-12 NOTE — Telephone Encounter (Signed)
Patient confirming appt with Dr. Burr Medico 12/7.

## 2014-05-16 ENCOUNTER — Encounter: Payer: Self-pay | Admitting: Radiation Oncology

## 2014-05-16 NOTE — Progress Notes (Signed)
Location of Breast Cancer: right lower-inner quadrant  Histology per Pathology Report:  10/26/13 Diagnosis 1. Breast, simple mastectomy, Right - INVASIVE DUCTAL CARCINOMA, GRADE III/III. - LYMPHOVASCULAR INVASION IS IDENTIFIED. - CARCINOMA IS PRESENT AT NON ORIENTED INKED TISSUE EDGE(S). - SEE ONCOLOGY TABLE BELOW. 2. Lymph nodes, regional resection, Right axilla - METASTATIC CARCINOMA IN 2 OF 16 LYMPH NODES (2/16).  10/24/13 Diagnosis Breast, excision, Right and necrotizing tissue - HIGH GRADE CARCINOMA, SEE COMMENT. - CARCINOMA EXTENDS INTO SKIN WITH INVOLVEMENT OF DERMAL LYMPHATICS.  Receptor Status: ER(-), PR (-), Her2-neu (-)/triple negative  Did patient present with symptoms (if so, please note symptoms) or was this found on screening mammography?: patient went to ED c/o right breast pain and discharge, admitted for debridement and surgery. On the same day the patient underwent debridement of the wound, with the pathology ([id number] [id number]) showing high-grade carcinoma involving ulcerated skin. There was tumor within the dermal and subcutaneous lymphatics.  Past/Anticipated interventions by surgeon, if any: right mastectomy, axillary dissection  Past/Anticipated interventions by medical oncology, if any: Chemotherapy Dr Lona Kettle: .Initial chemotherapy with dose dense doxorubicin and cyclophosphamide x 4 cycles completed 01/09/2014 #3. carboplatin and paclitaxel weekly x12 doses, started on 01/23/2014. With second week of therapy, she had a mild reaction to Taxol which was adequately treated. Last tx 05/01/14. #4 postmastectomy radiation to follow after completion of chemotherapy  Lymphedema issues, if any: No    Pain issues, if any:No breast pain.Has stiffness and chemo-induced neuropathy of fingers.     SAFETY ISSUES:  Prior radiation? no  Pacemaker/ICD? no  Possible current pregnancy? no  Is the patient on methotrexate? no  Current Complaints / other details:  Divorced,  worked at [employer], lives alone but daughter often stays with her menarche age 11, first live birth age 73, P64, menopause age 29, no HRT. Main complaint is oral pain from poor dentition of lower mouth.  NKDA    Andria Rhein, RN 05/16/2014,1:25 PM

## 2014-05-17 ENCOUNTER — Ambulatory Visit
Admission: RE | Admit: 2014-05-17 | Discharge: 2014-05-17 | Disposition: A | Discharge status: Home / Self Care | Payer: BC Managed Care – PPO | Source: Ambulatory Visit | Attending: Radiation Oncology | Admitting: Radiation Oncology

## 2014-05-17 ENCOUNTER — Encounter: Payer: Self-pay | Admitting: Radiation Oncology

## 2014-05-17 ENCOUNTER — Encounter: Payer: Self-pay | Admitting: *Deleted

## 2014-05-17 VITALS — BP 121/57 | HR 102 | Temp 98.6°F | Wt 160.8 lb

## 2014-05-17 DIAGNOSIS — C50919 Malignant neoplasm of unspecified site of unspecified female breast: Secondary | ICD-10-CM | POA: Diagnosis not present

## 2014-05-17 DIAGNOSIS — C50911 Malignant neoplasm of unspecified site of right female breast: Secondary | ICD-10-CM

## 2014-05-17 NOTE — Progress Notes (Signed)
[place] Psychosocial Distress Screening Clinical Social Work  Clinical Social Work was referred by distress screening protocol.  The patient scored a 5 on the Psychosocial Distress Thermometer which indicates moderate distress. Clinical Social Worker met with patient in office at [facility] to assess for distress and other psychosocial needs.  CSW and patient have had continued communication since she began treatment, and applied for multiple financial assistance programs.  Patient has been approved for many of the programs which have assisted with rent, household bills, and medical expenses.  Patient will be starting daily radiation and expressed some concerns for the cost of transportation.  CSW and patient discussed the alight transportation program through radiation.  Patient will get more information and enroll in the program on her SIM day.  Patient stated she was feeling well and her level of financial stress had decreased since getting approved for additional assistance.  CSW offered patient additional support and encouraged her to call with any questions or concerns.        ONCBCN DISTRESS SCREENING 05/17/2014  Screening Type Initial Screening  Distress experienced in past week (1-10) 5  Practical problem type Insurance;Transportation;Food  Emotional problem type Adjusting to illness;Boredom;Adjusting to appearance changes  Physical Problem type Loss of appetitie;Constipation/diarrhea;Tingling hands/feet  Physician notified of physical symptoms Yes  Referral to clinical social work Yes   Johnnye Lana, MSW, LCSW, OSW-C Clinical Social Worker [facility] [phone]

## 2014-05-17 NOTE — Progress Notes (Signed)
Please see the Nurse Progress Note in the MD Initial Consult Encounter for this patient. 

## 2014-05-17 NOTE — Progress Notes (Addendum)
Radiation Oncology         [phone]) [phone] ________________________________  Initial outpatient Consultation - Date: 05/17/2014   Name: Carmen Stone MRN: [id number]   DOB: [birth year]  REFERRING PHYSICIAN: Glean Salvo, *  STAGE: Inflammatory breast cancer   Staging form: Breast, AJCC 7th Edition     Pathologic stage from 05/17/2014: Stage IIIB (T4d, N1, cM0) - Signed by Thea Silversmith, MD on 05/17/2014   HISTORY OF PRESENT ILLNESS::Carmen Stone is a 58 y.o. female  who presented to the emergency room with complaints of sudden onset right breast pain and swelling.  She was found on exam to have a fluctuant mass incolving the right breast which came off in handfuls consistent with an inflammatory breast cancer. Pathology showed a grade 3 triple negative right breast cancer. Skin and dermal lymphatic involvement was noted. A CT of the C/A/P showed the breast mass, right axillayr lymph nodes and no evidence of metastatic disease. She underwent a right mastectomy and ALND on 5/13. This showed a large high grade breast mass with unoriented positive margins even after re-excision and 2/16 axillary nodes were positive. She was treated with 4 cycles of AC followed by TC x 12 completed 05/01/14. She has been followed by social work for transportation and financial issues. She presents unaccompanied today for my opinion regarding radiation in the management of her disease.   PREVIOUS RADIATION THERAPY: No  FAMILY HISTORY:  Family History  Problem Relation Age of Onset  . Lung cancer Mother   . Emphysema Father     SOCIAL HISTORY:  History  Substance Use Topics  . Smoking status: Never Smoker   . Smokeless tobacco: Not on file  . Alcohol Use: No    REVIEW OF SYSTEMS:  A 15 point review of systems is documented in the electronic medical record. This was obtained by the nursing staff. However, I reviewed this with the patient to discuss relevant findings and make appropriate  changes.  Pertinent positives are included in the chart.   PHYSICAL EXAM:  Filed Vitals:   05/17/14 0828  BP: 121/57  Pulse: 102  Temp: 98.6 F (37 C)  .160 lb 12.8 oz (72.938 kg). Pleasant female. No distress. Quiet. Right chest wall is smooth and clear with a well healed mastectomy scar. No palpable lymph adenopathy. Good range of motion in her bilateral arms. Alert and oriented x 3.   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 We discussed the role of radiation in decreasing local failures in patients who undergo mastectomy and have positive margins/inflammatory breast cancer. . We discussed the process of simulation and the placement tattoos. We discussed 6 weeks of treatment as an outpatient. We discussed the possibility of asymptomatic lung damage. We discussed the low likelihood of secondary malignancies. We discussed the possible side effects including but not limited to skin redness, fatigue, permanent skin darkening, and chest wall swelling. We discussed increased complications that can occur with reconstruction after radiation.   I have not restaged her as I believe local failure could be a difficult to control issue and prophylactic treatment at this time is warranted regardless of her systemic disease state.   I scheduled her for simulation on 12/15. She would like to start after Christmas which is fine. She will need assistance with transportation. She is also asking for assistance from SW  for her post mastectomy supplies.   I spent 60 minutes  face to face with the patient and more than 50% of that time was spent in counseling and/or coordination of care.   ------------------------------------------------  Thea Silversmith, MD

## 2014-05-18 ENCOUNTER — Encounter: Payer: Self-pay | Admitting: *Deleted

## 2014-05-18 NOTE — Progress Notes (Signed)
[place] Work  Clinical Social Work was referred by Pension scheme manager for assessment of psychosocial needs.  Clinical Social Worker contacted patient at home to offer support and assess for needs.  Pt requesting assistance with transportation and post mastectomy supplies. CSW revisited how to obtain assistance through the financial counselors in radiation with transportation. Pt stated understanding and is aware of how/who to talk to currently. CSW also discussed possibilities for help with supplies. CSW will work to arrange this through Proofreader and meet with pt on Monday to discuss plan.  Clinical Social Work interventions: Resource education Assessment of needs  Loren Racer, [place] Clinical Social Worker [employer]  [place] Phone: [phone] Fax: [phone]

## 2014-05-19 ENCOUNTER — Telehealth: Payer: Self-pay | Admitting: Hematology

## 2014-05-19 ENCOUNTER — Other Ambulatory Visit: Payer: Self-pay | Admitting: Hematology

## 2014-05-22 ENCOUNTER — Ambulatory Visit: Payer: BC Managed Care – PPO

## 2014-05-22 ENCOUNTER — Telehealth: Payer: Self-pay | Admitting: Hematology

## 2014-05-22 ENCOUNTER — Ambulatory Visit (HOSPITAL_BASED_OUTPATIENT_CLINIC_OR_DEPARTMENT_OTHER): Payer: BC Managed Care – PPO | Admitting: Hematology

## 2014-05-22 ENCOUNTER — Other Ambulatory Visit: Payer: BC Managed Care – PPO

## 2014-05-22 ENCOUNTER — Other Ambulatory Visit (HOSPITAL_BASED_OUTPATIENT_CLINIC_OR_DEPARTMENT_OTHER): Payer: BC Managed Care – PPO

## 2014-05-22 ENCOUNTER — Encounter: Payer: Self-pay | Admitting: Hematology

## 2014-05-22 VITALS — BP 131/61 | HR 94 | Temp 98.5°F | Resp 19 | Ht 64.0 in | Wt 138.8 lb

## 2014-05-22 DIAGNOSIS — Z95828 Presence of other vascular implants and grafts: Secondary | ICD-10-CM

## 2014-05-22 DIAGNOSIS — Z171 Estrogen receptor negative status [ER-]: Secondary | ICD-10-CM

## 2014-05-22 DIAGNOSIS — C50919 Malignant neoplasm of unspecified site of unspecified female breast: Secondary | ICD-10-CM

## 2014-05-22 DIAGNOSIS — C50911 Malignant neoplasm of unspecified site of right female breast: Secondary | ICD-10-CM

## 2014-05-22 DIAGNOSIS — E119 Type 2 diabetes mellitus without complications: Secondary | ICD-10-CM

## 2014-05-22 DIAGNOSIS — N611 Abscess of the breast and nipple: Secondary | ICD-10-CM

## 2014-05-22 LAB — COMPREHENSIVE METABOLIC PANEL (CC13)
ALT: 9 U/L (ref 0–55)
ANION GAP: 10 meq/L (ref 3–11)
AST: 14 U/L (ref 5–34)
Albumin: 3.5 g/dL (ref 3.5–5.0)
Alkaline Phosphatase: 101 U/L (ref 40–150)
BUN: 10.6 mg/dL (ref 7.0–26.0)
CALCIUM: 9.1 mg/dL (ref 8.4–10.4)
CHLORIDE: 105 meq/L (ref 98–109)
CO2: 26 meq/L (ref 22–29)
CREATININE: 0.8 mg/dL (ref 0.6–1.1)
GLUCOSE: 182 mg/dL — AB (ref 70–140)
Potassium: 3.6 mEq/L (ref 3.5–5.1)
Sodium: 141 mEq/L (ref 136–145)
Total Protein: 6.4 g/dL (ref 6.4–8.3)

## 2014-05-22 LAB — CBC WITH DIFFERENTIAL/PLATELET
BASO%: 1.1 % (ref 0.0–2.0)
BASOS ABS: 0.1 10*3/uL (ref 0.0–0.1)
EOS%: 1.3 % (ref 0.0–7.0)
Eosinophils Absolute: 0.1 10*3/uL (ref 0.0–0.5)
HCT: 30.6 % — ABNORMAL LOW (ref 34.8–46.6)
HEMOGLOBIN: 10 g/dL — AB (ref 11.6–15.9)
LYMPH#: 2 10*3/uL (ref 0.9–3.3)
LYMPH%: 32.1 % (ref 14.0–49.7)
MCH: 31.7 pg (ref 25.1–34.0)
MCHC: 32.8 g/dL (ref 31.5–36.0)
MCV: 96.7 fL (ref 79.5–101.0)
MONO#: 1 10*3/uL — AB (ref 0.1–0.9)
MONO%: 16.6 % — ABNORMAL HIGH (ref 0.0–14.0)
NEUT#: 3.1 10*3/uL (ref 1.5–6.5)
NEUT%: 48.9 % (ref 38.4–76.8)
Platelets: 330 10*3/uL (ref 145–400)
RBC: 3.16 10*6/uL — ABNORMAL LOW (ref 3.70–5.45)
RDW: 15.7 % — ABNORMAL HIGH (ref 11.2–14.5)
WBC: 6.3 10*3/uL (ref 3.9–10.3)

## 2014-05-22 MED ORDER — HEPARIN SOD (PORK) LOCK FLUSH 100 UNIT/ML IV SOLN
500.0000 [IU] | Freq: Once | INTRAVENOUS | Status: DC
  Filled 2014-05-22: qty 5

## 2014-05-22 MED ORDER — TRAMADOL HCL 50 MG PO TABS: 50.0000 mg | ORAL_TABLET | Freq: Four times a day (QID) | ORAL | Status: DC | PRN

## 2014-05-22 MED ORDER — SODIUM CHLORIDE 0.9 % IJ SOLN
10.0000 mL | INTRAMUSCULAR | Status: DC | PRN
  Filled 2014-05-22: qty 10

## 2014-05-22 NOTE — Progress Notes (Signed)
[place] OFFICE PROGRESS NOTE  Patient Care Team: [name], MD as PCP - General (Family Medicine)  SUMMARY OF ONCOLOGIC HISTORY:   Inflammatory breast cancer   10/22/2013 Initial Diagnosis Inflammatory breast cancer, T4N1M0, stage IIIB, R breast skin and mass biopsy showed ulcerated high grade carcinoma, ER-/PR-/HER2-   10/26/2013 Surgery right modefied mastectomy, surgical path showed high grade invasive carcinoma, extends to skin. 2/16 nodes were positive. Dur to prior surgery, tumor size difficult to measure, but at least T3   10/26/2013 Imaging CT CAP was negative for distant metastasis     Chemotherapy Adjuvant chemo    CURRENT THERAPY: Pending adjuvant radiation.  INTERVAL HISTORY: She returns for follow up. She was previously under Dr. San Jetty care, who has left the practice. She finished planned chemotherapy on 05/01/2014. She tolerated it well overall.  She feels well in general. She caught a cold a few days ago, with nasal congestion, and some cough, no fever or chills, no chest pain or dyspnea. She has some pain at the bottom of her right foot for 5-6 days, no other pain. She otherwise feels well overall, she has good appetite and eats well. Weight is stable.  She has seen radiation oncologist Dr. Pablo Ledger, and is can start radiation after Christmas.  REVIEW OF SYSTEMS:   Constitutional: Denies fevers, chills or abnormal weight loss Eyes: Denies blurriness of vision Ears, nose, mouth, throat, and face: Denies mucositis or sore throat Respiratory: Denies cough, dyspnea or wheezes Cardiovascular: Denies palpitation, chest discomfort or lower extremity swelling Gastrointestinal:  Denies nausea, heartburn or change in bowel habits Skin: Denies abnormal skin rashes Lymphatics: Denies new lymphadenopathy or easy bruising Neurological:Denies numbness, tingling or new weaknesses Behavioral/Psych: Mood is stable, no new changes  All other systems were  reviewed with the patient and are negative.  MEDICAL HISTORY:  Past Medical History  Diagnosis Date  . Diabetes mellitus, type II   . Inflammatory breast cancer 10/22/13    right    SURGICAL HISTORY: Past Surgical History  Procedure Laterality Date  . Cesarean section    . Ventral hernia repair  2011    repair incacerated VH with biologic mesh; cholecystectomy  . Cholecystectomy  2011  . Incision and drainage of wound Right 10/22/2013    Procedure: IRRIGATION AND DRAINAGE AND DEBRIDEMENT RIGHT BREAST WOUND;  Surgeon: [name], MD;  Location: [facility];  Service: General;  Laterality: Right;  . Breast biopsy Right 10/22/2013    Procedure: BREAST BIOPSY;  Surgeon: [name], MD;  Location: [place];  Service: General;  Laterality: Right;  . Irrigation and debridement abscess Right 10/24/2013    Procedure: Right total mastectomy;  Surgeon: [name], MD;  Location: [facility];  Service: General;  Laterality: Right;  . Dressing change under anesthesia Right 10/26/2013    Procedure: COMPLETION MASTECTOMY, AXILLARY DISSECTION;  Surgeon: [name], MD;  Location: [facility];  Service: General;  Laterality: Right;  . Portacath placement N/A 11/16/2013    Procedure: INSERTION PORT-A-CATH;  Surgeon: [name], MD;  Location: [facility];  Service: General;  Laterality: N/A;    I have reviewed the social history and family history with the patient and they are unchanged from previous note.  ALLERGIES:  has No Known Allergies.  MEDICATIONS:  Current Outpatient Prescriptions  Medication Sig Dispense Refill  . acetaminophen (TYLENOL) 325 MG tablet Take 650 mg by mouth every 6 (six) hours as needed for mild pain.    Marland Kitchen  glucose blood test strip Use as instructed 100 each 12  . glucose monitoring kit (FREESTYLE) monitoring kit 1 each by Does not apply route as needed for other. Check blood sugars daily. 1 each 0  . Insulin Glargine (LANTUS SOLOSTAR) 100 UNIT/ML Solostar Pen  Inject 10 Units into the skin daily at 10 pm. 15 mL 3  . lidocaine-prilocaine (EMLA) cream Apply 1 application topically as needed. 30 g 0  . linagliptin (TRADJENTA) 5 MG TABS tablet Take 1 tablet (5 mg total) by mouth daily. 30 tablet 0  . pantoprazole (PROTONIX) 40 MG tablet Take 1 tablet (40 mg total) by mouth daily at 6 (six) AM. 30 tablet 3  . traMADol (ULTRAM) 50 MG tablet Take 1 tablet (50 mg total) by mouth every 6 (six) hours as needed. 60 tablet 1  . dexamethasone (DECADRON) 4 MG tablet Take 2 tablets by mouth once a day on the day after chemotherapy and then take 2 tablets two times a day for 2 days. Take with food. (Patient not taking: Reported on 05/17/2014) 30 tablet 1  . Diphenhyd-Hydrocort-Nystatin (FIRST-DUKES MOUTHWASH) SUSP Swish and spit 5 mls by mouth every 6 hours for mouth sores and use before each meal. (Patient not taking: Reported on 05/22/2014) 120 mL 1  . loperamide (IMODIUM) 2 MG capsule Take 2 mg by mouth as needed for diarrhea or loose stools.    Marland Kitchen LORazepam (ATIVAN) 0.5 MG tablet Take 1 tablet (0.5 mg total) by mouth at bedtime as needed (Nausea or vomiting). (Patient not taking: Reported on 05/22/2014) 30 tablet 0  . ondansetron (ZOFRAN) 8 MG tablet Take 1 tablet (8 mg total) by mouth every 8 (eight) hours as needed for nausea or vomiting. (Patient not taking: Reported on 05/17/2014) 20 tablet 3   No current facility-administered medications for this visit.    PHYSICAL EXAMINATION: ECOG PERFORMANCE STATUS: 0 - Asymptomatic  Filed Vitals:   05/22/14 1554  BP: 131/61  Pulse: 94  Temp: 98.5 F (36.9 C)  Resp: 19   Filed Weights   05/22/14 1554  Weight: 138 lb 12.8 oz (62.959 kg)    GENERAL:alert, no distress and comfortable SKIN: skin color, texture, turgor are normal, no rashes or significant lesions EYES: normal, Conjunctiva are pink and non-injected, sclera clear OROPHARYNX:no exudate, no erythema and lips, buccal mucosa, and tongue normal  NECK: supple,  thyroid normal size, non-tender, without nodularity LYMPH:  no palpable lymphadenopathy in the cervical, axillary or inguinal LUNGS: clear to auscultation and percussion with normal breathing effort HEART: regular rate & rhythm and no murmurs and no lower extremity edema ABDOMEN:abdomen soft, non-tender and normal bowel sounds Musculoskeletal:no cyanosis of digits and no clubbing  NEURO: alert & oriented x 3 with fluent speech, no focal motor/sensory deficits Breast: Right breast surgically absent, left breast exam showed no palpable mass, no skin change or nipple discharge. No palpable nodes on bilateral axilla.  LABORATORY DATA:  I have reviewed the data as listed CBC Latest Ref Rng 05/22/2014 05/01/2014 04/24/2014  WBC 3.9 - 10.3 10e3/uL 6.3 3.1(L) 3.6(L)  Hemoglobin 11.6 - 15.9 g/dL 10.0(L) 10.1(L) 9.7(L)  Hematocrit 34.8 - 46.6 % 30.6(L) 31.0(L) 29.6(L)  Platelets 145 - 400 10e3/uL 330 178 190     CMP Latest Ref Rng 05/22/2014 05/01/2014 04/24/2014  Glucose 70 - 140 mg/dl 182(H) 138 130  BUN 7.0 - 26.0 mg/dL 10.6 11.4 10.5  Creatinine 0.6 - 1.1 mg/dL 0.8 0.6 0.7  Sodium 136 - 145 mEq/L 141 140 141  Potassium 3.5 - 5.1 mEq/L 3.6 4.0 4.0  Chloride 96 - 112 mEq/L - - -  CO2 22 - 29 mEq/L '26 26 28  ' Calcium 8.4 - 10.4 mg/dL 9.1 9.0 8.8  Total Protein 6.4 - 8.3 g/dL 6.4 6.1(L) 6.0(L)  Total Bilirubin 0.20 - 1.20 mg/dL <0.20 0.30 0.20  Alkaline Phos 40 - 150 U/L 101 83 91  AST 5 - 34 U/L '14 12 13  ' ALT 0 - 55 U/L '9 12 11      ' RADIOGRAPHIC STUDIES: I have personally reviewed the radiological images as listed and agreed with the findings in the report. No results found.   ASSESSMENT & PLAN:  58 year old female with past medical history of diabetes, presented with a right inflammatory breast cancer, #1. status post right modified radical mastectomy 10/26/2013 for a cT4, pT3 pN1, stage IIIB invasive ductal carcinoma, grade 3, triple negative.  1. Stage IIIB triple negative right  breast cancer -She is clinically doing well, completed planned adjuvant chemotherapy. -She is scheduled to start adjuvant irradiation after the holiday. -We discussed the high risk of cancer recurrence given the clinical T4 N1 disease, with upfront mastectomy (unlikely complete surgical resection), and adjuvant chemotherapy and radiation. I plan to obtain a restaging CT chest and abdomen and are bone scan after she completes adjuvant radiation, and follow her closely every 3-4 months for the first 2 years, then every 6-12 months for additional 3 years.   2. Diabetes, type 2 She will follow-up with her primary physician  Follow-up: I'll see her back in 2 months with a baseline restaging CT and bone scan.   Orders Placed This Encounter  Procedures  . CT Abdomen Pelvis W Contrast    Standing Status: Future     Number of Occurrences:      Standing Expiration Date: 05/23/2015    Order Specific Question:  Reason for Exam (SYMPTOM  OR DIAGNOSIS REQUIRED)    Answer:  stage III breast cancer, restaging after therapy    Order Specific Question:  Is the patient pregnant?    Answer:  No    Order Specific Question:  Preferred imaging location?    Answer:  [facility]  . CT Chest W Contrast    Standing Status: Future     Number of Occurrences:      Standing Expiration Date: 05/23/2015    Order Specific Question:  Reason for Exam (SYMPTOM  OR DIAGNOSIS REQUIRED)    Answer:  stage III breast cancer, restaging after therapy    Order Specific Question:  Is the patient pregnant?    Answer:  No    Order Specific Question:  Preferred imaging location?    Answer:  [facility] Bone Scan Whole Body    Standing Status: Future     Number of Occurrences:      Standing Expiration Date: 05/23/2015    Order Specific Question:  Reason for Exam (SYMPTOM  OR DIAGNOSIS REQUIRED)    Answer:  stage III breast cancer, restaging after therapy    Order Specific Question:  Is the patient pregnant?     Answer:  No    Order Specific Question:  Preferred imaging location?    Answer:  [facility]  . CBC with Differential    Standing Status: Future     Number of Occurrences:      Standing Expiration Date: 05/23/2015  . Comprehensive metabolic panel (Cmet) - [facility]    Standing Status: Future  Number of Occurrences:      Standing Expiration Date: 05/23/2015   All questions were answered. The patient knows to call the clinic with any problems, questions or concerns. No barriers to learning was detected.  I spent 25 minutes counseling the patient face to face. The total time spent in the appointment was 30 minutes and more than 50% was on counseling and review of test results     Truitt Merle, MD 05/22/2014 11:54 PM

## 2014-05-22 NOTE — Progress Notes (Signed)
Patient in for Port-A-Cath access and labs today. Patient declined port access and flush and states, "They can draw labs from my arm." All labs drawn by Phlebotomist today.

## 2014-05-22 NOTE — Patient Instructions (Signed)

## 2014-05-22 NOTE — Telephone Encounter (Signed)
Gave avs & cal for Dec thru March.

## 2014-05-30 ENCOUNTER — Ambulatory Visit (HOSPITAL_BASED_OUTPATIENT_CLINIC_OR_DEPARTMENT_OTHER): Payer: BC Managed Care – PPO

## 2014-05-30 ENCOUNTER — Ambulatory Visit
Admission: RE | Admit: 2014-05-30 | Discharge: 2014-05-30 | Disposition: A | Discharge status: Home / Self Care | Payer: BC Managed Care – PPO | Source: Ambulatory Visit | Attending: Radiation Oncology | Admitting: Radiation Oncology

## 2014-05-30 VITALS — BP 124/68 | HR 88 | Temp 98.3°F

## 2014-05-30 DIAGNOSIS — C50919 Malignant neoplasm of unspecified site of unspecified female breast: Secondary | ICD-10-CM | POA: Diagnosis not present

## 2014-05-30 DIAGNOSIS — Z95828 Presence of other vascular implants and grafts: Secondary | ICD-10-CM

## 2014-05-30 DIAGNOSIS — C50911 Malignant neoplasm of unspecified site of right female breast: Secondary | ICD-10-CM

## 2014-05-30 DIAGNOSIS — Z452 Encounter for adjustment and management of vascular access device: Secondary | ICD-10-CM

## 2014-05-30 MED ORDER — SODIUM CHLORIDE 0.9 % IJ SOLN
10.0000 mL | INTRAMUSCULAR | Status: DC | PRN
  Administered 2014-05-30: 10 mL via INTRAVENOUS
  Filled 2014-05-30: qty 10

## 2014-05-30 MED ORDER — HEPARIN SOD (PORK) LOCK FLUSH 100 UNIT/ML IV SOLN
500.0000 [IU] | Freq: Once | INTRAVENOUS | Status: AC
  Administered 2014-05-30: 500 [IU] via INTRAVENOUS
  Filled 2014-05-30: qty 5

## 2014-05-30 NOTE — Patient Instructions (Signed)

## 2014-05-30 NOTE — Progress Notes (Signed)
Name: Carmen Stone   MRN: [id number]  Date:  05/30/2014  DOB: 02/08/1956  Status:outpatient    DIAGNOSIS: Breast cancer.  CONSENT VERIFIED: yes   SET UP: Patient is setup supine   IMMOBILIZATION:  The following immobilization was used:Custom Moldable Pillow, breast board.   NARRATIVE: Ms. Reddix was brought to the [facility].  Identity was confirmed.  All relevant records and images related to the planned course of therapy were reviewed.  Then, the patient was positioned in a stable reproducible clinical set-up for radiation therapy.  Wires were placed to delineate the clinical extent of breast tissue. A wire was placed on the scar as well.  CT images were obtained.  An isocenter was placed. Skin markings were placed.  The CT images were loaded into the planning software where the target and avoidance structures were contoured.  The radiation prescription was entered and confirmed. The patient was discharged in stable condition and tolerated simulation well.    TREATMENT PLANNING NOTE:  Treatment planning then occurred. I have requested : MLC's, isodose plan, basic dose calculation  I personally designed and supervised the construction of 5 medically necessary complex treatment devices for the protection of critical normal structures including the lungs and contralateral breast as well as the immobilization device which is necessary for set up certainty.   TREATMENT PLANNING NOTE/3D Simulation Note Treatment planning then occurred. I have requested : MLC's, isodose plan, basic dose calculation  3D simulation was performed.  I personally constructed 6 complex treatment devices in the form of MLCs which will be used for beam modification and to protect critical structures including the heart and lung.  I have requested a dose volume histogram of the heart lung and tumor cavity.

## 2014-06-12 ENCOUNTER — Ambulatory Visit: Payer: BC Managed Care – PPO | Admitting: Radiation Oncology

## 2014-06-13 ENCOUNTER — Ambulatory Visit: Payer: BC Managed Care – PPO

## 2014-06-14 ENCOUNTER — Ambulatory Visit: Payer: BC Managed Care – PPO

## 2014-06-14 DIAGNOSIS — C50919 Malignant neoplasm of unspecified site of unspecified female breast: Secondary | ICD-10-CM | POA: Diagnosis not present

## 2014-06-15 ENCOUNTER — Ambulatory Visit: Payer: BC Managed Care – PPO

## 2014-06-16 DIAGNOSIS — C50919 Malignant neoplasm of unspecified site of unspecified female breast: Secondary | ICD-10-CM | POA: Diagnosis present

## 2014-06-19 ENCOUNTER — Ambulatory Visit
Admission: RE | Admit: 2014-06-19 | Discharge: 2014-06-19 | Disposition: A | Discharge status: Home / Self Care | Payer: BC Managed Care – PPO | Source: Ambulatory Visit | Attending: Radiation Oncology | Admitting: Radiation Oncology

## 2014-06-19 ENCOUNTER — Encounter: Payer: Self-pay | Admitting: Radiation Oncology

## 2014-06-19 DIAGNOSIS — C50919 Malignant neoplasm of unspecified site of unspecified female breast: Secondary | ICD-10-CM | POA: Diagnosis not present

## 2014-06-19 NOTE — Progress Notes (Signed)
Simulation verification note: The patient underwent simulation verification today for treatment to her right chest wall and regional lymph nodes.  Her isocenter is in good position and the multileaf collimators contoured the intended treatment volumes appropriately.

## 2014-06-20 ENCOUNTER — Ambulatory Visit
Admission: RE | Admit: 2014-06-20 | Discharge: 2014-06-20 | Disposition: A | Discharge status: Home / Self Care | Payer: Self-pay | Source: Ambulatory Visit | Attending: Radiation Oncology | Admitting: Radiation Oncology

## 2014-06-20 ENCOUNTER — Ambulatory Visit
Admission: RE | Admit: 2014-06-20 | Discharge: 2014-06-20 | Disposition: A | Discharge status: Home / Self Care | Payer: BC Managed Care – PPO | Source: Ambulatory Visit | Attending: Radiation Oncology | Admitting: Radiation Oncology

## 2014-06-20 ENCOUNTER — Ambulatory Visit: Payer: BC Managed Care – PPO | Admitting: Radiation Oncology

## 2014-06-20 VITALS — BP 114/64 | HR 84 | Temp 99.1°F | Wt 155.7 lb

## 2014-06-20 DIAGNOSIS — C50911 Malignant neoplasm of unspecified site of right female breast: Secondary | ICD-10-CM | POA: Insufficient documentation

## 2014-06-20 DIAGNOSIS — C50919 Malignant neoplasm of unspecified site of unspecified female breast: Secondary | ICD-10-CM | POA: Diagnosis not present

## 2014-06-20 MED ORDER — RADIAPLEXRX EX GEL
Freq: Once | CUTANEOUS | Status: AC
  Administered 2014-06-20: 14:00:00 via TOPICAL

## 2014-06-20 MED ORDER — ALRA NON-METALLIC DEODORANT (RAD-ONC): 1.0000 "application " | Freq: Once | TOPICAL | Status: DC

## 2014-06-20 MED ORDER — RADIAPLEXRX EX GEL: Freq: Once | CUTANEOUS | Status: DC

## 2014-06-20 MED ORDER — ALRA NON-METALLIC DEODORANT (RAD-ONC)
1.0000 "application " | Freq: Once | TOPICAL | Status: AC
  Administered 2014-06-20: 1 via TOPICAL

## 2014-06-20 NOTE — Progress Notes (Signed)
Weekly Management Note Current Dose: 1.8  Gy  Projected Dose: 60.4 Gy   Narrative:  The patient presents for routine under treatment assessment.  CBCT/MVCT images/Port film x-rays were reviewed.  The chart was checked. Doing well. No complaints. RN education performed.   Physical Findings: Weight: 155 lb 11.2 oz (70.625 kg). Unchanged  Impression:  The patient is tolerating radiation.  Plan:  Continue treatment as planned. Start radiaplex.

## 2014-06-21 ENCOUNTER — Ambulatory Visit
Admission: RE | Admit: 2014-06-21 | Discharge: 2014-06-21 | Disposition: A | Discharge status: Home / Self Care | Payer: BC Managed Care – PPO | Source: Ambulatory Visit | Attending: Radiation Oncology | Admitting: Radiation Oncology

## 2014-06-21 DIAGNOSIS — C50919 Malignant neoplasm of unspecified site of unspecified female breast: Secondary | ICD-10-CM | POA: Diagnosis not present

## 2014-06-22 ENCOUNTER — Other Ambulatory Visit: Payer: Self-pay | Admitting: Hematology

## 2014-06-22 ENCOUNTER — Ambulatory Visit
Admission: RE | Admit: 2014-06-22 | Discharge: 2014-06-22 | Disposition: A | Discharge status: Home / Self Care | Payer: BC Managed Care – PPO | Source: Ambulatory Visit | Attending: Radiation Oncology | Admitting: Radiation Oncology

## 2014-06-22 DIAGNOSIS — C50919 Malignant neoplasm of unspecified site of unspecified female breast: Secondary | ICD-10-CM | POA: Diagnosis not present

## 2014-06-23 ENCOUNTER — Ambulatory Visit
Admission: RE | Admit: 2014-06-23 | Discharge: 2014-06-23 | Disposition: A | Discharge status: Home / Self Care | Payer: BC Managed Care – PPO | Source: Ambulatory Visit | Attending: Radiation Oncology | Admitting: Radiation Oncology

## 2014-06-23 DIAGNOSIS — C50919 Malignant neoplasm of unspecified site of unspecified female breast: Secondary | ICD-10-CM | POA: Diagnosis not present

## 2014-06-26 ENCOUNTER — Ambulatory Visit
Admission: RE | Admit: 2014-06-26 | Discharge: 2014-06-26 | Disposition: A | Discharge status: Home / Self Care | Payer: BC Managed Care – PPO | Source: Ambulatory Visit | Attending: Radiation Oncology | Admitting: Radiation Oncology

## 2014-06-26 DIAGNOSIS — C50919 Malignant neoplasm of unspecified site of unspecified female breast: Secondary | ICD-10-CM | POA: Diagnosis not present

## 2014-06-27 ENCOUNTER — Ambulatory Visit
Admission: RE | Admit: 2014-06-27 | Discharge: 2014-06-27 | Disposition: A | Discharge status: Home / Self Care | Payer: BLUE CROSS/BLUE SHIELD | Source: Ambulatory Visit | Attending: Radiation Oncology | Admitting: Radiation Oncology

## 2014-06-27 ENCOUNTER — Ambulatory Visit (HOSPITAL_BASED_OUTPATIENT_CLINIC_OR_DEPARTMENT_OTHER): Payer: BLUE CROSS/BLUE SHIELD

## 2014-06-27 ENCOUNTER — Ambulatory Visit
Admission: RE | Admit: 2014-06-27 | Discharge: 2014-06-27 | Disposition: A | Discharge status: Home / Self Care | Payer: BC Managed Care – PPO | Source: Ambulatory Visit | Attending: Radiation Oncology | Admitting: Radiation Oncology

## 2014-06-27 VITALS — BP 113/52 | HR 85 | Temp 98.3°F

## 2014-06-27 VITALS — BP 131/60 | HR 93 | Temp 99.1°F | Wt 159.3 lb

## 2014-06-27 DIAGNOSIS — C50919 Malignant neoplasm of unspecified site of unspecified female breast: Secondary | ICD-10-CM | POA: Diagnosis not present

## 2014-06-27 DIAGNOSIS — C50911 Malignant neoplasm of unspecified site of right female breast: Secondary | ICD-10-CM

## 2014-06-27 DIAGNOSIS — Z452 Encounter for adjustment and management of vascular access device: Secondary | ICD-10-CM

## 2014-06-27 DIAGNOSIS — Z95828 Presence of other vascular implants and grafts: Secondary | ICD-10-CM

## 2014-06-27 MED ORDER — HEPARIN SOD (PORK) LOCK FLUSH 100 UNIT/ML IV SOLN
500.0000 [IU] | Freq: Once | INTRAVENOUS | Status: AC
  Administered 2014-06-27: 500 [IU] via INTRAVENOUS
  Filled 2014-06-27: qty 5

## 2014-06-27 MED ORDER — SODIUM CHLORIDE 0.9 % IJ SOLN
10.0000 mL | INTRAMUSCULAR | Status: DC | PRN
  Administered 2014-06-27: 10 mL via INTRAVENOUS
  Filled 2014-06-27: qty 10

## 2014-06-27 NOTE — Patient Instructions (Signed)

## 2014-06-27 NOTE — Progress Notes (Signed)
  Radiation Oncology         [phone] ________________________________  Name: Carmen Stone MRN: [id number]  Date: 06/27/2014  DOB: 07-22-1955  Weekly Radiation Therapy Management  Inflammatory breast cancer   Staging form: Breast, AJCC 7th Edition     Pathologic stage from 05/17/2014: Stage IIIB (T4d, N1, cM0) - Signed by Thea Silversmith, MD on 05/17/2014    Current Dose: 10.8 Gy     Planned Dose:  60.4 Gy  Narrative . . . . . . . . The patient presents for routine under treatment assessment.                                   The patient is without complaint.                                 Set-up films were reviewed.                                 The chart was checked. Physical Findings. . .  weight is 159 lb 4.8 oz (72.258 kg). Her temperature is 99.1 F (37.3 C). Her blood pressure is 131/60 and her pulse is 93. . The lungs are clear. The heart has a regular rhythm and rate. The right chest wall area shows minimal hyperpigmentation changes. Impression . . . . . . . The patient is tolerating radiation. Plan . . . . . . . . . . . . Continue treatment as planned.  ________________________________   Blair Promise, PhD, MD

## 2014-06-27 NOTE — Progress Notes (Signed)
Completed 6 of 28 fractions.No skin changes.Denies pain or any other concerns.

## 2014-06-28 ENCOUNTER — Ambulatory Visit
Admission: RE | Admit: 2014-06-28 | Discharge: 2014-06-28 | Disposition: A | Discharge status: Home / Self Care | Payer: BC Managed Care – PPO | Source: Ambulatory Visit | Attending: Radiation Oncology | Admitting: Radiation Oncology

## 2014-06-28 DIAGNOSIS — C50919 Malignant neoplasm of unspecified site of unspecified female breast: Secondary | ICD-10-CM | POA: Diagnosis not present

## 2014-06-29 ENCOUNTER — Ambulatory Visit
Admission: RE | Admit: 2014-06-29 | Discharge: 2014-06-29 | Disposition: A | Discharge status: Home / Self Care | Payer: BC Managed Care – PPO | Source: Ambulatory Visit | Attending: Radiation Oncology | Admitting: Radiation Oncology

## 2014-06-29 DIAGNOSIS — C50919 Malignant neoplasm of unspecified site of unspecified female breast: Secondary | ICD-10-CM | POA: Diagnosis not present

## 2014-06-30 ENCOUNTER — Ambulatory Visit
Admission: RE | Admit: 2014-06-30 | Discharge: 2014-06-30 | Disposition: A | Discharge status: Home / Self Care | Payer: BC Managed Care – PPO | Source: Ambulatory Visit | Attending: Radiation Oncology | Admitting: Radiation Oncology

## 2014-06-30 DIAGNOSIS — C50919 Malignant neoplasm of unspecified site of unspecified female breast: Secondary | ICD-10-CM | POA: Diagnosis not present

## 2014-07-03 ENCOUNTER — Ambulatory Visit
Admission: RE | Admit: 2014-07-03 | Discharge: 2014-07-03 | Disposition: A | Discharge status: Home / Self Care | Payer: BC Managed Care – PPO | Source: Ambulatory Visit | Attending: Radiation Oncology | Admitting: Radiation Oncology

## 2014-07-03 DIAGNOSIS — C50919 Malignant neoplasm of unspecified site of unspecified female breast: Secondary | ICD-10-CM | POA: Diagnosis not present

## 2014-07-04 ENCOUNTER — Ambulatory Visit
Admission: RE | Admit: 2014-07-04 | Discharge: 2014-07-04 | Disposition: A | Discharge status: Home / Self Care | Payer: BC Managed Care – PPO | Source: Ambulatory Visit | Attending: Radiation Oncology | Admitting: Radiation Oncology

## 2014-07-04 ENCOUNTER — Ambulatory Visit
Admission: RE | Admit: 2014-07-04 | Discharge: 2014-07-04 | Disposition: A | Discharge status: Home / Self Care | Payer: BLUE CROSS/BLUE SHIELD | Source: Ambulatory Visit | Attending: Radiation Oncology | Admitting: Radiation Oncology

## 2014-07-04 VITALS — BP 129/66 | HR 93 | Temp 98.5°F | Wt 156.1 lb

## 2014-07-04 DIAGNOSIS — C50919 Malignant neoplasm of unspecified site of unspecified female breast: Secondary | ICD-10-CM | POA: Diagnosis not present

## 2014-07-04 DIAGNOSIS — C50911 Malignant neoplasm of unspecified site of right female breast: Secondary | ICD-10-CM

## 2014-07-04 NOTE — Progress Notes (Signed)
Weekly Management Note Current Dose: 19.8  Gy  Projected Dose: 60.4 Gy   Narrative:  The patient presents for routine under treatment assessment.  CBCT/MVCT images/Port film x-rays were reviewed.  The chart was checked. Doing well. Right arm somewhat sore.   Physical Findings: Weight: 156 lb 1.6 oz (70.806 kg). Mildly dark right chest wall.  Impression:  The patient is tolerating radiation.  Plan:  Continue treatment as planned. Doing well. Continue radiaplex. Has been to PT and knows what exercises to do with her arm.

## 2014-07-05 ENCOUNTER — Ambulatory Visit
Admission: RE | Admit: 2014-07-05 | Discharge: 2014-07-05 | Disposition: A | Discharge status: Home / Self Care | Payer: BC Managed Care – PPO | Source: Ambulatory Visit | Attending: Radiation Oncology | Admitting: Radiation Oncology

## 2014-07-05 DIAGNOSIS — C50919 Malignant neoplasm of unspecified site of unspecified female breast: Secondary | ICD-10-CM | POA: Diagnosis not present

## 2014-07-06 ENCOUNTER — Ambulatory Visit
Admission: RE | Admit: 2014-07-06 | Discharge: 2014-07-06 | Disposition: A | Discharge status: Home / Self Care | Payer: BC Managed Care – PPO | Source: Ambulatory Visit | Attending: Radiation Oncology | Admitting: Radiation Oncology

## 2014-07-06 DIAGNOSIS — C50919 Malignant neoplasm of unspecified site of unspecified female breast: Secondary | ICD-10-CM | POA: Diagnosis not present

## 2014-07-07 ENCOUNTER — Ambulatory Visit: Payer: BC Managed Care – PPO

## 2014-07-10 ENCOUNTER — Ambulatory Visit
Admission: RE | Admit: 2014-07-10 | Discharge: 2014-07-10 | Disposition: A | Discharge status: Home / Self Care | Payer: BC Managed Care – PPO | Source: Ambulatory Visit | Attending: Radiation Oncology | Admitting: Radiation Oncology

## 2014-07-10 DIAGNOSIS — C50919 Malignant neoplasm of unspecified site of unspecified female breast: Secondary | ICD-10-CM | POA: Diagnosis not present

## 2014-07-11 ENCOUNTER — Encounter: Payer: Self-pay | Admitting: Radiation Oncology

## 2014-07-11 ENCOUNTER — Ambulatory Visit
Admission: RE | Admit: 2014-07-11 | Discharge: 2014-07-11 | Disposition: A | Discharge status: Home / Self Care | Payer: BC Managed Care – PPO | Source: Ambulatory Visit | Attending: Radiation Oncology | Admitting: Radiation Oncology

## 2014-07-11 ENCOUNTER — Ambulatory Visit
Admission: RE | Admit: 2014-07-11 | Discharge: 2014-07-11 | Disposition: A | Discharge status: Home / Self Care | Payer: BLUE CROSS/BLUE SHIELD | Source: Ambulatory Visit | Attending: Radiation Oncology | Admitting: Radiation Oncology

## 2014-07-11 VITALS — BP 126/73 | HR 95 | Temp 98.2°F | Resp 20 | Wt 158.2 lb

## 2014-07-11 DIAGNOSIS — C50911 Malignant neoplasm of unspecified site of right female breast: Secondary | ICD-10-CM

## 2014-07-11 DIAGNOSIS — C50919 Malignant neoplasm of unspecified site of unspecified female breast: Secondary | ICD-10-CM | POA: Diagnosis not present

## 2014-07-11 NOTE — Progress Notes (Signed)
Weekly Management Note Current Dose: 27  Gy  Projected Dose: 60.4 Gy   Narrative:  The patient presents for routine under treatment assessment.  CBCT/MVCT images/Port film x-rays were reviewed.  The chart was checked. Doing well. No complaints. Still sore under right arm.   Physical Findings: Weight: 158 lb 3.2 oz (71.759 kg). Unchanged  Impression:  The patient is tolerating radiation.  Plan:  Continue treatment as planned. Continue radiaplex. Discussed fear of recurrence.

## 2014-07-11 NOTE — Progress Notes (Signed)
Weekly rad txs right chest wall/subclavian , 15 txs completed,  darkened skin, intact, no pain, uses radiaplex bid,, appetite good, no nausea,  Does exercises  Right arm, when getting up from bed in am,  Energy level pretty good,  4:12 PM

## 2014-07-12 ENCOUNTER — Ambulatory Visit
Admission: RE | Admit: 2014-07-12 | Discharge: 2014-07-12 | Disposition: A | Discharge status: Home / Self Care | Payer: BC Managed Care – PPO | Source: Ambulatory Visit | Attending: Radiation Oncology | Admitting: Radiation Oncology

## 2014-07-12 DIAGNOSIS — C50919 Malignant neoplasm of unspecified site of unspecified female breast: Secondary | ICD-10-CM | POA: Diagnosis not present

## 2014-07-13 ENCOUNTER — Ambulatory Visit
Admission: RE | Admit: 2014-07-13 | Discharge: 2014-07-13 | Disposition: A | Discharge status: Home / Self Care | Payer: BC Managed Care – PPO | Source: Ambulatory Visit | Attending: Radiation Oncology | Admitting: Radiation Oncology

## 2014-07-13 DIAGNOSIS — C50919 Malignant neoplasm of unspecified site of unspecified female breast: Secondary | ICD-10-CM | POA: Diagnosis not present

## 2014-07-14 ENCOUNTER — Ambulatory Visit
Admission: RE | Admit: 2014-07-14 | Discharge: 2014-07-14 | Disposition: A | Discharge status: Home / Self Care | Payer: BC Managed Care – PPO | Source: Ambulatory Visit | Attending: Radiation Oncology | Admitting: Radiation Oncology

## 2014-07-14 DIAGNOSIS — C50919 Malignant neoplasm of unspecified site of unspecified female breast: Secondary | ICD-10-CM | POA: Diagnosis not present

## 2014-07-17 ENCOUNTER — Ambulatory Visit
Admission: RE | Admit: 2014-07-17 | Discharge: 2014-07-17 | Disposition: A | Discharge status: Home / Self Care | Payer: BC Managed Care – PPO | Source: Ambulatory Visit | Attending: Radiation Oncology | Admitting: Radiation Oncology

## 2014-07-18 ENCOUNTER — Encounter: Payer: Self-pay | Admitting: Radiation Oncology

## 2014-07-18 ENCOUNTER — Ambulatory Visit
Admission: RE | Admit: 2014-07-18 | Discharge: 2014-07-18 | Disposition: A | Discharge status: Home / Self Care | Payer: BC Managed Care – PPO | Source: Ambulatory Visit | Attending: Radiation Oncology | Admitting: Radiation Oncology

## 2014-07-18 ENCOUNTER — Ambulatory Visit
Admission: RE | Admit: 2014-07-18 | Discharge: 2014-07-18 | Disposition: A | Discharge status: Home / Self Care | Payer: BLUE CROSS/BLUE SHIELD | Source: Ambulatory Visit | Attending: Radiation Oncology | Admitting: Radiation Oncology

## 2014-07-18 VITALS — BP 125/68 | HR 63 | Temp 98.8°F | Resp 16 | Ht 64.0 in | Wt 159.0 lb

## 2014-07-18 DIAGNOSIS — C50911 Malignant neoplasm of unspecified site of right female breast: Secondary | ICD-10-CM

## 2014-07-18 MED ORDER — RADIAPLEXRX EX GEL
Freq: Once | CUTANEOUS | Status: AC
  Administered 2014-07-18: 17:00:00 via TOPICAL

## 2014-07-18 NOTE — Progress Notes (Signed)
Carmen Stone has completed 20 fractions to her right chest wall and subclavian area.  She denies pain but does have some soreness in her right chest wall.  She denies a sore throat/trouble swallowing.  She denies fatigue.  The skin on her right chest wall has hyperpigmentation.  She is using radiaplex gel twice a day.

## 2014-07-18 NOTE — Progress Notes (Signed)
  Radiation Oncology         (336) [phone] ________________________________  Name: Carmen Stone MRN: [id number]  Date: 07/18/2014  DOB: 09/23/1955  Weekly Radiation Therapy Management  Inflammatory breast cancer   Staging form: Breast, AJCC 7th Edition     Pathologic stage from 05/17/2014: Stage IIIB (T4d, N1, cM0) - Signed by Thea Silversmith, MD on 05/17/2014   Current Dose: 36 Gy     Planned Dose:  60.4 Gy  Narrative . . . . . . . . The patient presents for routine under treatment assessment.                                   The patient is without complaint. She denies any significant itching. She has some mild discomfort along the right chest wall area. She denies any fatigue or cough                                 Set-up films were reviewed.                                 The chart was checked. Physical Findings. . .  height is 5\' 4"  (1.626 m) and weight is 159 lb (72.122 kg). Her oral temperature is 98.8 F (37.1 C). Her blood pressure is 125/68 and her pulse is 63. Her respiration is 16. . The lungs are clear. The heart has a regular rhythm and rate. The right chest wall area shows hyperpigmentation changes without any skin breakdown Impression . . . . . . . The patient is tolerating radiation. Plan . . . . . . . . . . . . Continue treatment as planned.  ________________________________   Blair Promise, PhD, MD

## 2014-07-19 ENCOUNTER — Ambulatory Visit
Admission: RE | Admit: 2014-07-19 | Discharge: 2014-07-19 | Disposition: A | Discharge status: Home / Self Care | Payer: BC Managed Care – PPO | Source: Ambulatory Visit | Attending: Radiation Oncology | Admitting: Radiation Oncology

## 2014-07-20 ENCOUNTER — Ambulatory Visit
Admission: RE | Admit: 2014-07-20 | Discharge: 2014-07-20 | Disposition: A | Discharge status: Home / Self Care | Payer: BC Managed Care – PPO | Source: Ambulatory Visit | Attending: Radiation Oncology | Admitting: Radiation Oncology

## 2014-07-21 ENCOUNTER — Ambulatory Visit: Payer: BC Managed Care – PPO

## 2014-07-21 ENCOUNTER — Ambulatory Visit
Admission: RE | Admit: 2014-07-21 | Discharge: 2014-07-21 | Disposition: A | Discharge status: Home / Self Care | Payer: BC Managed Care – PPO | Source: Ambulatory Visit | Attending: Radiation Oncology | Admitting: Radiation Oncology

## 2014-07-24 ENCOUNTER — Ambulatory Visit
Admission: RE | Admit: 2014-07-24 | Discharge: 2014-07-24 | Disposition: A | Discharge status: Home / Self Care | Payer: BC Managed Care – PPO | Source: Ambulatory Visit | Attending: Radiation Oncology | Admitting: Radiation Oncology

## 2014-07-25 ENCOUNTER — Ambulatory Visit
Admission: RE | Admit: 2014-07-25 | Discharge: 2014-07-25 | Disposition: A | Discharge status: Home / Self Care | Payer: BLUE CROSS/BLUE SHIELD | Source: Ambulatory Visit | Attending: Radiation Oncology | Admitting: Radiation Oncology

## 2014-07-25 ENCOUNTER — Ambulatory Visit
Admission: RE | Admit: 2014-07-25 | Discharge: 2014-07-25 | Disposition: A | Discharge status: Home / Self Care | Payer: BC Managed Care – PPO | Source: Ambulatory Visit | Attending: Radiation Oncology | Admitting: Radiation Oncology

## 2014-07-25 VITALS — BP 113/70 | HR 101 | Temp 98.8°F | Wt 156.9 lb

## 2014-07-25 DIAGNOSIS — M79603 Pain in arm, unspecified: Secondary | ICD-10-CM | POA: Insufficient documentation

## 2014-07-25 DIAGNOSIS — C50911 Malignant neoplasm of unspecified site of right female breast: Secondary | ICD-10-CM | POA: Insufficient documentation

## 2014-07-25 DIAGNOSIS — L814 Other melanin hyperpigmentation: Secondary | ICD-10-CM | POA: Insufficient documentation

## 2014-07-25 DIAGNOSIS — L598 Other specified disorders of the skin and subcutaneous tissue related to radiation: Secondary | ICD-10-CM | POA: Insufficient documentation

## 2014-07-25 MED ORDER — BIAFINE EX EMUL
CUTANEOUS | Status: DC | PRN
  Administered 2014-07-25: 17:00:00 via TOPICAL

## 2014-07-25 MED ORDER — TRAMADOL HCL 50 MG PO TABS: ORAL_TABLET | ORAL | Status: DC

## 2014-07-25 NOTE — Progress Notes (Signed)
Weekly Management Note Current Dose: 45  Gy  Projected Dose: 50.4 Gy   Narrative:  The patient presents for routine under treatment assessment.  CBCT/MVCT images/Port film x-rays were reviewed.  The chart was checked. Doing well. Skin is itching. Appt next week with medical oncology. Needs more tramadol due to arm pain. Has not see PT. REquests refill for protonix (started with chemo)  Physical Findings: Pink, hyperpigmentation with dry desquamation over treatment field.   Impression:  The patient is tolerating radiation.  Plan:  Continue treatment as planned. No boost. Skin is irritated and further boost will push skin to moist desquamation. In addition, non oriented margins would leave entire chest wall needing to be boosted. Discussed with patient. Refill tramadol. Pt can d/c protonix as no longer taking chemo. Monitor for reflux disease.

## 2014-07-25 NOTE — Addendum Note (Signed)
Encounter addended by: Arlyss Repress, RN on: 07/25/2014  4:49 PM<BR>     Documentation filed: Inpatient MAR

## 2014-07-25 NOTE — Addendum Note (Signed)
Encounter addended by: Arlyss Repress, RN on: 07/25/2014  4:44 PM<BR>     Documentation filed: [employer], Orders

## 2014-07-26 ENCOUNTER — Telehealth: Payer: Self-pay | Admitting: *Deleted

## 2014-07-26 ENCOUNTER — Ambulatory Visit
Admission: RE | Admit: 2014-07-26 | Discharge: 2014-07-26 | Disposition: A | Discharge status: Home / Self Care | Payer: BC Managed Care – PPO | Source: Ambulatory Visit | Attending: Radiation Oncology | Admitting: Radiation Oncology

## 2014-07-26 NOTE — Telephone Encounter (Signed)
CALLED PATIENT TO INFORM OF PT APPT. FOR 08-01-14- ARRIVAL TIME - 8:30 AM @ [place],  [place]. NO. [phone], NO ANSWER WILL CALL LATER.

## 2014-07-27 ENCOUNTER — Ambulatory Visit: Payer: BC Managed Care – PPO

## 2014-07-27 ENCOUNTER — Ambulatory Visit
Admission: RE | Admit: 2014-07-27 | Discharge: 2014-07-27 | Disposition: A | Discharge status: Home / Self Care | Payer: BC Managed Care – PPO | Source: Ambulatory Visit | Attending: Radiation Oncology | Admitting: Radiation Oncology

## 2014-07-27 NOTE — Progress Notes (Signed)
Skin check.given tube of biafine .Hyperpigmentation of skin which is dry.Follow up in 2 weeks.

## 2014-07-28 ENCOUNTER — Encounter: Payer: Self-pay | Admitting: Radiation Oncology

## 2014-07-28 ENCOUNTER — Ambulatory Visit: Payer: BC Managed Care – PPO

## 2014-07-28 ENCOUNTER — Ambulatory Visit
Admission: RE | Admit: 2014-07-28 | Discharge: 2014-07-28 | Disposition: A | Discharge status: Home / Self Care | Payer: BC Managed Care – PPO | Source: Ambulatory Visit | Attending: Radiation Oncology | Admitting: Radiation Oncology

## 2014-07-31 ENCOUNTER — Ambulatory Visit: Payer: BC Managed Care – PPO

## 2014-07-31 ENCOUNTER — Encounter (HOSPITAL_COMMUNITY)
Admission: RE | Admit: 2014-07-31 | Discharge: 2014-07-31 | Disposition: A | Discharge status: Home / Self Care | Payer: BLUE CROSS/BLUE SHIELD | Source: Ambulatory Visit | Attending: Hematology | Admitting: Hematology

## 2014-07-31 ENCOUNTER — Encounter (HOSPITAL_COMMUNITY): Payer: Self-pay

## 2014-07-31 ENCOUNTER — Ambulatory Visit (HOSPITAL_COMMUNITY)
Admission: RE | Admit: 2014-07-31 | Discharge: 2014-07-31 | Disposition: A | Discharge status: Home / Self Care | Payer: BLUE CROSS/BLUE SHIELD | Source: Ambulatory Visit | Attending: Hematology | Admitting: Hematology

## 2014-07-31 ENCOUNTER — Other Ambulatory Visit (HOSPITAL_BASED_OUTPATIENT_CLINIC_OR_DEPARTMENT_OTHER): Payer: BLUE CROSS/BLUE SHIELD

## 2014-07-31 DIAGNOSIS — C50919 Malignant neoplasm of unspecified site of unspecified female breast: Secondary | ICD-10-CM

## 2014-07-31 DIAGNOSIS — K7689 Other specified diseases of liver: Secondary | ICD-10-CM | POA: Insufficient documentation

## 2014-07-31 DIAGNOSIS — N611 Abscess of the breast and nipple: Secondary | ICD-10-CM

## 2014-07-31 DIAGNOSIS — E119 Type 2 diabetes mellitus without complications: Secondary | ICD-10-CM

## 2014-07-31 DIAGNOSIS — R0902 Hypoxemia: Secondary | ICD-10-CM

## 2014-07-31 DIAGNOSIS — C50911 Malignant neoplasm of unspecified site of right female breast: Secondary | ICD-10-CM

## 2014-07-31 DIAGNOSIS — Z923 Personal history of irradiation: Secondary | ICD-10-CM | POA: Insufficient documentation

## 2014-07-31 DIAGNOSIS — Z9011 Acquired absence of right breast and nipple: Secondary | ICD-10-CM | POA: Insufficient documentation

## 2014-07-31 DIAGNOSIS — Z08 Encounter for follow-up examination after completed treatment for malignant neoplasm: Secondary | ICD-10-CM | POA: Insufficient documentation

## 2014-07-31 DIAGNOSIS — D6481 Anemia due to antineoplastic chemotherapy: Secondary | ICD-10-CM

## 2014-07-31 LAB — COMPREHENSIVE METABOLIC PANEL (CC13)
ALBUMIN: 3.7 g/dL (ref 3.5–5.0)
ALK PHOS: 103 U/L (ref 40–150)
ALT: 9 U/L (ref 0–55)
ANION GAP: 11 meq/L (ref 3–11)
AST: 13 U/L (ref 5–34)
BUN: 16.6 mg/dL (ref 7.0–26.0)
CALCIUM: 9 mg/dL (ref 8.4–10.4)
CHLORIDE: 104 meq/L (ref 98–109)
CO2: 26 meq/L (ref 22–29)
Creatinine: 0.7 mg/dL (ref 0.6–1.1)
EGFR: >90 mL/min/{1.73_m2} (ref >90)
GLUCOSE: 97 mg/dL (ref 70–140)
Potassium: 4.2 mEq/L (ref 3.5–5.1)
Sodium: 141 mEq/L (ref 136–145)
Total Bilirubin: 0.2 mg/dL (ref 0.20–1.20)
Total Protein: 6.9 g/dL (ref 6.4–8.3)

## 2014-07-31 LAB — CBC WITH DIFFERENTIAL/PLATELET
BASO%: 0.3 % (ref 0.0–2.0)
Basophils Absolute: 0 10*3/uL (ref 0.0–0.1)
EOS%: 4.6 % (ref 0.0–7.0)
Eosinophils Absolute: 0.3 10*3/uL (ref 0.0–0.5)
HCT: 35.4 % (ref 34.8–46.6)
HGB: 11.7 g/dL (ref 11.6–15.9)
LYMPH%: 11.6 % — AB (ref 14.0–49.7)
MCH: 31.2 pg (ref 25.1–34.0)
MCHC: 33.1 g/dL (ref 31.5–36.0)
MCV: 94.4 fL (ref 79.5–101.0)
MONO#: 0.6 10*3/uL (ref 0.1–0.9)
MONO%: 10.1 % (ref 0.0–14.0)
NEUT%: 73.4 % (ref 38.4–76.8)
NEUTROS ABS: 4.5 10*3/uL (ref 1.5–6.5)
Platelets: 228 10*3/uL (ref 145–400)
RBC: 3.75 10*6/uL (ref 3.70–5.45)
RDW: 12.9 % (ref 11.2–14.5)
WBC: 6.1 10*3/uL (ref 3.9–10.3)
lymph#: 0.7 10*3/uL — ABNORMAL LOW (ref 0.9–3.3)

## 2014-07-31 MED ORDER — IOHEXOL 300 MG/ML  SOLN
100.0000 mL | Freq: Once | INTRAMUSCULAR | Status: AC | PRN
  Administered 2014-07-31: 100 mL via INTRAVENOUS

## 2014-07-31 MED ORDER — TECHNETIUM TC 99M MEDRONATE IV KIT
25.2000 | PACK | Freq: Once | INTRAVENOUS | Status: AC | PRN
  Administered 2014-07-31: 25.2 via INTRAVENOUS

## 2014-08-01 ENCOUNTER — Encounter (HOSPITAL_COMMUNITY): Payer: BC Managed Care – PPO

## 2014-08-01 ENCOUNTER — Ambulatory Visit: Payer: BC Managed Care – PPO

## 2014-08-01 ENCOUNTER — Ambulatory Visit: Payer: 59 | Attending: Radiation Oncology | Admitting: Physical Therapy

## 2014-08-01 ENCOUNTER — Encounter (HOSPITAL_COMMUNITY): Payer: BLUE CROSS/BLUE SHIELD

## 2014-08-01 DIAGNOSIS — R293 Abnormal posture: Secondary | ICD-10-CM | POA: Diagnosis not present

## 2014-08-01 DIAGNOSIS — I972 Postmastectomy lymphedema syndrome: Secondary | ICD-10-CM

## 2014-08-01 DIAGNOSIS — R29898 Other symptoms and signs involving the musculoskeletal system: Secondary | ICD-10-CM | POA: Diagnosis not present

## 2014-08-01 DIAGNOSIS — M25611 Stiffness of right shoulder, not elsewhere classified: Secondary | ICD-10-CM | POA: Diagnosis not present

## 2014-08-01 NOTE — Therapy (Addendum)
[place], [place], [address] Phone: [phone]   Fax:  [phone]  Physical Therapy Evaluation  Patient Details  Name: Carmen Stone MRN: [id number] Date of Birth: Oct 22, 1955 Referring Provider:  Thea Silversmith, MD  Encounter Date: 08/01/2014      PT End of Session - 08/01/14 1309    Visit Number 1   Number of Visits 9   Date for PT Re-Evaluation 09/01/14   PT Start Time 0845   PT Stop Time 0930   PT Time Calculation (min) 45 min   Activity Tolerance Patient tolerated treatment well   Behavior During Therapy [facility] for tasks assessed/performed      Past Medical History  Diagnosis Date  . Diabetes mellitus, type II   . Inflammatory breast cancer 10/22/13    right    Past Surgical History  Procedure Laterality Date  . Cesarean section    . Ventral hernia repair  2011    repair incacerated VH with biologic mesh; cholecystectomy  . Cholecystectomy  2011  . Incision and drainage of wound Right 10/22/2013    Procedure: IRRIGATION AND DRAINAGE AND DEBRIDEMENT RIGHT BREAST WOUND;  Surgeon: [name], MD;  Location: [facility];  Service: General;  Laterality: Right;  . Breast biopsy Right 10/22/2013    Procedure: BREAST BIOPSY;  Surgeon: [name], MD;  Location: [facility];  Service: General;  Laterality: Right;  . Irrigation and debridement abscess Right 10/24/2013    Procedure: Right total mastectomy;  Surgeon: [name], MD;  Location: [facility];  Service: General;  Laterality: Right;  . Dressing change under anesthesia Right 10/26/2013    Procedure: COMPLETION MASTECTOMY, AXILLARY DISSECTION;  Surgeon: [name], MD;  Location: [facility];  Service: General;  Laterality: Right;  . Portacath placement N/A 11/16/2013    Procedure: INSERTION PORT-A-CATH;  Surgeon: [name], MD;  Location: [facility];  Service: General;  Laterality: N/A;    There were no vitals taken  for this visit.  Visit Diagnosis:  Lymphedema syndrome, postmastectomy  Posture abnormality  Muscular deconditioning  Shoulder stiffness, right      Subjective Assessment - 08/01/14 0857    Symptoms Pt  is having pain under her right arm at area of swelling. She comes in wearing her post op pink bandeau but it is not pulled all the way up into the axilla   Pertinent History Pt with 3 surgeries in May resulting in right mastectomy with axillary nodes dissection ( 2 of 16 removed postitive) for inflammatory breast cancer. She has undergone chemo and just now finishing radiation though may need to have 2 more weeks of boost   Patient Stated Goals to get rid of the pain swelling under her right arm   Currently in Pain? No/denies  not at current time, but she says she gets pain under right arm in the evenings          [facility] PT Assessment - 08/01/14 1303    Assessment   Medical Diagnosis mastectomy with axillary lymph node dissection   Onset Date 10/24/13   Precautions   Precautions Other (comment)   Precaution Comments cancer, undergoing radiation therapy   Restrictions   Weight Bearing Restrictions No   Balance Screen   Has the patient fallen in the past 6 months No   Has the patient had a decrease in activity level because of a fear of falling?  No   Is the patient reluctant  to leave their home because of a fear of falling?  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 skin dark. generalized muscle atrophy   Coordination   Gross Motor Movements are Fluid and Coordinated Yes   Posture/Postural Control   Posture/Postural Control Postural limitations   Postural Limitations Rounded Shoulders;Forward head;Increased thoracic kyphosis   AROM   Right Shoulder Extension 65 Degrees   Right Shoulder Flexion 140 Degrees   Right Shoulder ABduction 156 Degrees   Right Shoulder Internal Rotation 35 Degrees   Right Shoulder External Rotation 90 Degrees   Left Shoulder Extension 50 Degrees   Left Shoulder Flexion 155 Degrees   Left Shoulder ABduction 165 Degrees   Left Shoulder Internal Rotation 40 Degrees   Left Shoulder External Rotation 90 Degrees   Palpation   Palpation soft tissue under right arm    Ambulation/Gait   Ambulation/Gait Yes   Ambulation/Gait Assistance 7: Independent           LYMPHEDEMA/ONCOLOGY QUESTIONNAIRE - 08/01/14 1301    Type   Cancer Type inflammatory breast    Surgeries   Mastectomy Date 10/24/13   Other Surgery Date --  2 other surgeries in May - debridement prior mastectomy   Number Lymph Nodes Removed 16   Treatment   Past Chemotherapy Treatment Yes   Date 05/01/14   Active Radiation Treatment Yes   Body Site right upper quarter  may need to have a boost    Right Upper Extremity Lymphedema   At Axilla  33.5 cm   15 cm Proximal to Olecranon Process 31.6 cm   10 cm Proximal to Olecranon Process 30.5 cm   Olecranon Process 25 cm   15 cm Proximal to Ulnar Styloid Process 25.9 cm   10 cm Proximal to Ulnar Styloid Process 23.1 cm   Just Proximal to Ulnar Styloid Process 17.1 cm   Across Hand at [employer] 20.5 cm   At [place] of 2nd Digit 6.9 cm   Left Upper Extremity Lymphedema   At Axilla  32.1 cm   15 cm Proximal to Olecranon Process 30.5 cm   10 cm Proximal to  Olecranon Process 30.8 cm   Olecranon Process 25.3 cm   15 cm Proximal to Ulnar Styloid Process 24.8 cm   10 cm Proximal to Ulnar Styloid Process 22.5 cm   Just Proximal to Ulnar Styloid Process 15.9 cm   Across Hand at [employer] 20.2 cm   At [place] of 2nd Digit 6.6 cm   Other around chest at largest part (8 cm below axilla ) 104            Quick Dash - 08/01/14 0001    Open a tight or new jar No difficulty   Do heavy household chores (wash walls, wash floors) Mild difficulty  Carry a shopping bag or briefcase No difficulty   Wash your back Mild difficulty   Use a knife to cut food No difficulty   Recreational activities in which you take some force or impact through your arm, shoulder, or hand (golf, hammering, tennis) No difficulty   During the past week, to what extent has your arm, shoulder or hand problem interfered with your normal social activities with family, friends, neighbors, or groups? Not at all   During the past week, to what extent has your arm, shoulder or hand problem limited your work or other regular daily activities Not at all   Arm, shoulder, or hand pain. None   Tingling (pins and needles) in your arm, shoulder, or hand None   Difficulty Sleeping No difficulty   DASH Score 4.55 %            Short Term Clinic Goals - 08/01/14 1327    CC Short Term Goal  #1   Title short term goals=long term goals             Long Term Clinic Goals - 08/01/14 1327    CC Long Term Goal  #1   Title pt will verbalize understanding of lymphedema risk reduction practices   Time 8   Period Weeks   Status New   CC Long Term Goal  #2   Title Pt will report decrease in pain under right arm by 50% so she can tolerate her daily activities better   Time 8   Period Weeks   Status New   CC Long Term Goal  #3   Title pt will be independent in Strength ABC program so she can continue with strengthening at home and return to work with less risk for injury   Time 8    CC Long Term Goal  #4   Title pt will increase right shoulder flexion to 150 degrees so that she can perform her job duties easier.   Time 8   Period Weeks   Status New   CC Long Term Goal  #5   Title pt will verbalize a strategy for use of compression to help manage her swelling at home.   Time 8   Period Weeks   Status New            Plan - 08/01/14 1313    Clinical Impression Statement 59 yo female with > 6 months of treatment for inflmmatory breast cancer including mastectomy wtth 16 nodes removed, chemo and radiatoin presents with pain and swelling under right arm, postural dysfunction, altered skin from radiation and generalized muscle atrophy. She does not hae a prosthesis or postmastecomy bra, but continues to wear post op pink bandeau.  It was adjusted to be worn up higher into axilla to provide some compression to swollen area.  Also suggested to patient that she go to [place] for post mastectomy bra. Her job demands physical labor with upper extremtiies    Pt will benefit from skilled therapeutic intervention in order to improve on the following deficits Decreased knowledge of precautions;Decreased strength;Pain;Decreased knowledge of use of DME;Increased edema;Decreased range of motion;Postural dysfunction;Decreased skin integrity  DME= compression sleeve)   Rehab Potential Good   Clinical Impairments Affecting Rehab Potential Decreased skin integrity due to radiation treatment so extra care should be taken in this area.   PT Frequency 2x / week   PT Duration 4 weeks   PT Treatment/Interventions Therapeutic exercise;Compression bandaging;Neuromuscular re-education;Passive range of motion;DME  Instruction;Patient/family education;Manual techniques;Manual lymph drainage;Therapeutic activities   PT Next Visit Plan Assess skin and if repositioning of bandeau helped with edema under her arm. Possibley manual lymph drianage around radiaion areas.  Posture exercises. Begin  lymphedema risk reduction eduction and Strength ABC stretches and core work. Sign up for our ABC class   Recommended Other Services ABC class    Consulted and Agree with Plan of Care Patient         Problem List Patient Active Problem List   Diagnosis Date Noted  . Hypersensitivity reaction 02/08/2014  . Drug induced neutropenia(288.03) 01/30/2014  . Nausea with vomiting 11/04/2013  . 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 ATN (acute tubular necrosis) 11/02/2013  . Acute respiratory failure 11/02/2013  . Acute respiratory failure with hypoxia 10/25/2013  . Abnormal urinalysis 10/25/2013  . Postoperative anemia due to acute blood loss 10/24/2013  . Breast abscess of female 10/22/2013  . HYPERLIPIDEMIA 07/16/2010  . ATELECTASIS 07/16/2010  . DIABETES MELLITUS, TYPE II 06/24/2010  . 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 HYPOXEMIA 06/24/2010  . SMALL BOWEL OBSTRUCTION, HX OF 06/24/2010   Donato Heinz. Owens Shark, PT   08/01/2014, 1:32 PM  PHYSICAL THERAPY DISCHARGE SUMMARY  Visits from Start of Care: 1  Current functional level related to goals / functional outcomes: As above  Remaining deficits: As above   Education / Equipment: As above  Plan: Patient agrees to discharge.  Patient goals were not met. Patient is being discharged due to not returning since the last visit.  ?????        Maudry Diego, PT 06/27/2015 1:58 PM  [place], [place], [address] Phone: [phone]   Fax:  [phone]

## 2014-08-01 NOTE — Progress Notes (Signed)
  Radiation Oncology         (336) [phone] ________________________________  Name: DAVIN MURAMOTO MRN: [id number]  Date: 07/28/2014  DOB: [birth year]  End of Treatment Note  Diagnosis:   Inflammatory right breast cancer     Indication for treatment:  Curative  Radiation treatment dates:  06/20/2014-07/28/2014  Site/dose:     Right chest wall and right IM lymph nodes/50.4 [place] Cables @ 1.8 Pear[place] Cables per fraction x 28 fractions Right supraclavicular fossa and axilla / 45 Gy @1 .8 Gy per fraction x 25 fractions  Beams/energy:  Opposed Tangents / 6, 10, 15  MV photons LAO  and PA/ 6 and 10 MV photons  Narrative: The patient tolerated radiation treatment relatively well.   She had significant moist desquamation and peeling at the end of her chest wall treatment.  I put her on a 2 week break and brought her back for re-evaluation of a boost. Her skin has just started to barely heal back. She would have had significant skin reaction if we had proceeded on with a boost despite her positive margins. I therefore stopped her treatment and did not pursue a chest wall boost.   Plan: The patient has completed radiation treatment. The patient will return to radiation oncology clinic for routine followup in one month. I advised them to call or return sooner if they have any questions or concerns related to their recovery or treatment.  ------------------------------------------------  Thea Silversmith, MD

## 2014-08-02 ENCOUNTER — Ambulatory Visit: Payer: BC Managed Care – PPO

## 2014-08-03 ENCOUNTER — Ambulatory Visit: Payer: BC Managed Care – PPO

## 2014-08-04 ENCOUNTER — Ambulatory Visit: Payer: BC Managed Care – PPO

## 2014-08-07 ENCOUNTER — Ambulatory Visit (HOSPITAL_BASED_OUTPATIENT_CLINIC_OR_DEPARTMENT_OTHER): Payer: 59 | Admitting: Hematology

## 2014-08-07 ENCOUNTER — Telehealth: Payer: Self-pay | Admitting: Hematology

## 2014-08-07 ENCOUNTER — Ambulatory Visit: Payer: BC Managed Care – PPO

## 2014-08-07 ENCOUNTER — Encounter: Payer: Self-pay | Admitting: Hematology

## 2014-08-07 ENCOUNTER — Ambulatory Visit (HOSPITAL_BASED_OUTPATIENT_CLINIC_OR_DEPARTMENT_OTHER): Payer: 59

## 2014-08-07 VITALS — BP 132/84 | HR 98 | Temp 98.8°F | Resp 20 | Ht 64.0 in | Wt 163.8 lb

## 2014-08-07 DIAGNOSIS — Z171 Estrogen receptor negative status [ER-]: Secondary | ICD-10-CM | POA: Diagnosis not present

## 2014-08-07 DIAGNOSIS — Z95828 Presence of other vascular implants and grafts: Secondary | ICD-10-CM

## 2014-08-07 DIAGNOSIS — Z452 Encounter for adjustment and management of vascular access device: Secondary | ICD-10-CM

## 2014-08-07 DIAGNOSIS — C50911 Malignant neoplasm of unspecified site of right female breast: Secondary | ICD-10-CM

## 2014-08-07 MED ORDER — SODIUM CHLORIDE 0.9 % IJ SOLN
10.0000 mL | INTRAMUSCULAR | Status: DC | PRN
  Administered 2014-08-07: 10 mL via INTRAVENOUS
  Filled 2014-08-07: qty 10

## 2014-08-07 MED ORDER — HEPARIN SOD (PORK) LOCK FLUSH 100 UNIT/ML IV SOLN
500.0000 [IU] | Freq: Once | INTRAVENOUS | Status: AC
  Administered 2014-08-07: 500 [IU] via INTRAVENOUS
  Filled 2014-08-07: qty 5

## 2014-08-07 MED ORDER — TRAMADOL HCL 50 MG PO TABS: ORAL_TABLET | ORAL | Status: DC

## 2014-08-07 NOTE — Patient Instructions (Signed)

## 2014-08-07 NOTE — Progress Notes (Signed)
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 2/16 nodes were positive. Dur to prior surgery, tumor size difficult to measure, but at least T3   10/26/2013 Imaging CT CAP was negative for distant metastasis    11/28/2013 - 05/01/2014 Chemotherapy Adjuvant chemo: ddAC x4, followed by weekly Norma Fredrickson and Taxol X12, completed on 05/01/2014    CURRENT THERAPY: Adjuvant radiation.  INTERVAL HISTORY: She returns for follow up. Her last radiation was 3 days ago. She is going to see Dr. Pablo Ledger next week, and probably will get radiation boost for a few more weeks. She has been tolerating the radiation well, without other significant issues  Except radiation related dermatitis. She denies any pain, but does report mild discomfort in the radiation area. She otherwise is doing well.  REVIEW OF SYSTEMS:   Constitutional: Denies fevers, chills or abnormal weight loss Eyes: Denies blurriness of vision Ears, nose, mouth, throat, and face: Denies mucositis or sore throat Respiratory: Denies cough, dyspnea or wheezes Cardiovascular: Denies palpitation, chest discomfort or lower extremity swelling Gastrointestinal:  Denies nausea, heartburn or change in bowel habits Skin: Denies abnormal skin rashes Lymphatics: Denies new lymphadenopathy or easy bruising Neurological:Denies numbness, tingling or new weaknesses Behavioral/Psych: Mood is stable, no new changes  All other systems were reviewed with the patient and are negative.  MEDICAL HISTORY:  Past Medical History  Diagnosis Date  . Diabetes  mellitus, type II   . Inflammatory breast cancer 10/22/13    right    SURGICAL HISTORY: Past Surgical History  Procedure Laterality Date  . Cesarean section    . Ventral hernia repair  2011    repair incacerated VH with biologic mesh; cholecystectomy  . Cholecystectomy  2011  . Incision and drainage of wound Right 10/22/2013    Procedure: IRRIGATION AND DRAINAGE AND DEBRIDEMENT RIGHT BREAST WOUND;  Surgeon: [name], MD;  Location: [facility];  Service: General;  Laterality: Right;  . Breast biopsy Right 10/22/2013    Procedure: BREAST BIOPSY;  Surgeon: [name], MD;  Location: [facility];  Service: General;  Laterality: Right;  . Irrigation and debridement abscess Right 10/24/2013    Procedure: Right total mastectomy;  Surgeon: [name], MD;  Location: [facility];  Service: General;  Laterality: Right;  . Dressing change under anesthesia Right 10/26/2013    Procedure: COMPLETION MASTECTOMY, AXILLARY DISSECTION;  Surgeon: [name], MD;  Location: [facility];  Service: General;  Laterality: Right;  . Portacath placement N/A 11/16/2013    Procedure: INSERTION PORT-A-CATH;  Surgeon: [name], MD;  Location: [facility];  Service: General;  Laterality: N/A;    I have reviewed the social history and family history with the patient and they are unchanged from previous note.  ALLERGIES:  has No Known Allergies.  MEDICATIONS:  Current Outpatient Prescriptions  Medication Sig Dispense Refill  . acetaminophen (TYLENOL) 325 MG tablet Take 650 mg by mouth every 6 (six) hours as needed for mild pain.    Marland Kitchen dexamethasone (DECADRON) 4 MG tablet Take 2 tablets by mouth once a day on the day after chemotherapy and then take 2 tablets two  times a day for 2 days. Take with food. (Patient not taking: Reported on 07/18/2014) 30 tablet 1  . Diphenhyd-Hydrocort-Nystatin (FIRST-DUKES MOUTHWASH) SUSP Swish and spit 5 mls by mouth every 6 hours for mouth sores and use before each meal.  (Patient not taking: Reported on 07/25/2014) 120 mL 1  . emollient (BIAFINE) cream Apply 90 g topically 2 (two) times daily.    Marland Kitchen glucose blood test strip Use as instructed 100 each 12  . glucose monitoring kit (FREESTYLE) monitoring kit 1 each by Does not apply route as needed for other. Check blood sugars daily. 1 each 0  . Insulin Glargine (LANTUS SOLOSTAR) 100 UNIT/ML Solostar Pen Inject 10 Units into the skin daily at 10 pm. 15 mL 3  . lidocaine-prilocaine (EMLA) cream Apply 1 application topically as needed. 30 g 0  . linagliptin (TRADJENTA) 5 MG TABS tablet Take 1 tablet (5 mg total) by mouth daily. (Patient not taking: Reported on 08/01/2014) 30 tablet 0  . loperamide (IMODIUM) 2 MG capsule Take 2 mg by mouth as needed for diarrhea or loose stools.    Marland Kitchen LORazepam (ATIVAN) 0.5 MG tablet Take 1 tablet (0.5 mg total) by mouth at bedtime as needed (Nausea or vomiting). (Patient not taking: Reported on 08/01/2014) 30 tablet 0  . non-metallic deodorant (ALRA) MISC Apply 1 application topically daily as needed.    . ondansetron (ZOFRAN) 8 MG tablet Take 1 tablet (8 mg total) by mouth every 8 (eight) hours as needed for nausea or vomiting. 20 tablet 3  . traMADol (ULTRAM) 50 MG tablet take 1 tablet by mouth every 6 hours if needed 60 tablet 1   No current facility-administered medications for this visit.    PHYSICAL EXAMINATION: ECOG PERFORMANCE STATUS: 0 - Asymptomatic  Filed Vitals:   08/07/14 1547  BP: 132/84  Pulse: 98  Temp: 98.8 F (37.1 C)  Resp: 20   Filed Weights   08/07/14 1547  Weight: 163 lb 12.8 oz (74.299 kg)    GENERAL:alert, no distress and comfortable SKIN: skin color, texture, turgor are normal, no rashes.  EYES: normal, Conjunctiva are pink and non-injected, sclera clear OROPHARYNX:no exudate, no erythema and lips, buccal mucosa, and tongue normal  NECK: supple, thyroid normal size, non-tender, without nodularity LYMPH:  no palpable lymphadenopathy in the cervical,  axillary or inguinal LUNGS: clear to auscultation and percussion with normal breathing effort HEART: regular rate & rhythm and no murmurs and no lower extremity edema ABDOMEN:abdomen soft, non-tender and normal bowel sounds Musculoskeletal:no cyanosis of digits and no clubbing  NEURO: alert & oriented x 3 with fluent speech, no focal motor/sensory deficits Breast: Right breast surgically absent, (+) significant skin pigmentation at the right front chest radiation area, with mild shallow skin ulcers in mid chest. No discharge or swelling. left breast exam showed no palpable mass, no skin change or nipple discharge. No palpable nodes on bilateral axilla.  LABORATORY DATA:  I have reviewed the data as listed CBC Latest Ref Rng 07/31/2014 05/22/2014 05/01/2014  WBC 3.9 - 10.3 10e3/uL 6.1 6.3 3.1(L)  Hemoglobin 11.6 - 15.9 g/dL 11.7 10.0(L) 10.1(L)  Hematocrit 34.8 - 46.6 % 35.4 30.6(L) 31.0(L)  Platelets 145 - 400 10e3/uL 228 330 178     CMP Latest Ref Rng 07/31/2014 05/22/2014 05/01/2014  Glucose 70 - 140 mg/dl 97 182(H) 138  BUN 7.0 - 26.0 mg/dL 16.6 10.6 11.4  Creatinine 0.6 - 1.1 mg/dL 0.7 0.8 0.6  Sodium 136 - 145 mEq/L 141 141 140  Potassium 3.5 - 5.1 mEq/L 4.2 3.6 4.0  Chloride 96 - 112 mEq/L - - -  CO2 22 - 29 mEq/L '26 26 26  ' Calcium 8.4 - 10.4 mg/dL 9.0 9.1 9.0  Total Protein 6.4 - 8.3 g/dL 6.9 6.4 6.1(L)  Total Bilirubin 0.20 - 1.20 mg/dL 0.20 <0.20 0.30  Alkaline Phos 40 - 150 U/L 103 101 83  AST 5 - 34 U/L '13 14 12  ' ALT 0 - 55 U/L '9 9 12      ' RADIOGRAPHIC STUDIES: I have personally reviewed the radiological images as listed and agreed with the findings in the report. No results found.   ASSESSMENT & PLAN:  59 year old female with past medical history of diabetes, presented with a right inflammatory breast cancer, #1. status post right modified radical mastectomy 10/26/2013 for a cT4, pT3 pN1, stage IIIB invasive ductal carcinoma, grade 3, triple negative.  1. Stage IIIB  triple negative right breast cancer -She is clinically doing well, tolerating the adjuvant irradiation well. -I discussed her restaging CT chest and abdomen, which are negative for skin cancer recurrence. -We discussed the high risk for cancer recurrence, especially in the first 2-3 years. I recommend closer follow-up every 3-4 months for the first 2 years, then every 6-12 months for additional 3 years.  -there is no role for adjuvant endocrine therapy. -I  Well refe her to our survivorship clinic after she completes radiation.  2. Diabetes, type 2 She will follow-up with her primary physician  Follow-up: I'll see her back in 3 months with lab.   All questions were answered. The patient knows to call the clinic with any problems, questions or concerns. No barriers to learning was detected.  I spent 20 minutes counseling the patient face to face. The total time spent in the appointment was 25 minutes and more than 50% was on counseling and review of test results     Truitt Merle, MD 08/07/2014 4:16 PM

## 2014-08-07 NOTE — Telephone Encounter (Signed)
Gave avs & calendar for March/May. Sent message to GD to schedule survivorship, patient aware she will be in contact.

## 2014-08-08 ENCOUNTER — Ambulatory Visit: Payer: BC Managed Care – PPO

## 2014-08-08 ENCOUNTER — Telehealth: Payer: Self-pay | Admitting: Adult Health

## 2014-08-08 NOTE — Telephone Encounter (Signed)
I attempted to reach Ms. Bohorquez to schedule her initial Survivorship Clinic visit at the request of Dr. Burr Medico.  I was hoping to coordinate her 60-month follow-up visit with Dr. Pablo Ledger on 08/17/14 to see me as well.    Her voicemail box was full and unable to accept new messages.  I will try to reach her at another time.  I look forward to participating in her care.   Mike Craze, NP [place] [phone]

## 2014-08-11 ENCOUNTER — Telehealth: Payer: Self-pay | Admitting: Adult Health

## 2014-08-11 NOTE — Telephone Encounter (Signed)
I tried to reach Ms. Kobayashi on both her cell phone and her work phone to schedule her Survivorship Clinic appt at the request of Dr. Burr Medico.  There was no answer on her cell phone with message stating "the voicemail box is full."  The person who answered at her work stated that she was not in the office.  No message was left with that person.  I will try again to reach her next week.   Mike Craze, NP [place] [phone]

## 2014-08-15 ENCOUNTER — Telehealth: Payer: Self-pay | Admitting: Adult Health

## 2014-08-15 NOTE — Telephone Encounter (Signed)
I tried to reach Carmen Stone on both her cell phone and her work phone to schedule her Survivorship Clinic appt at the request of Dr. Burr Medico. There was no answer on her cell phone with message stating "the voicemail box is full." The person who answered at her work stated that she was not in the office at this time. No message was left with that person.   I was trying to coordinate a visit with her on 08/17/14 when she returns to the cancer center to see Dr. Pablo Ledger for her 1 month f/u visit.  I will try to speak to the patient in person on 08/17/14 to schedule her Survivorship Clinic visit.     Mike Craze, NP [place] [phone]

## 2014-08-17 ENCOUNTER — Encounter: Payer: Self-pay | Admitting: Adult Health

## 2014-08-17 ENCOUNTER — Ambulatory Visit
Admission: RE | Admit: 2014-08-17 | Discharge: 2014-08-17 | Disposition: A | Discharge status: Home / Self Care | Payer: BLUE CROSS/BLUE SHIELD | Source: Ambulatory Visit | Attending: Radiation Oncology | Admitting: Radiation Oncology

## 2014-08-17 VITALS — BP 126/62 | HR 91 | Temp 98.0°F | Wt 161.7 lb

## 2014-08-17 DIAGNOSIS — C50911 Malignant neoplasm of unspecified site of right female breast: Secondary | ICD-10-CM

## 2014-08-17 NOTE — Progress Notes (Signed)
Patient here for routine follow up completion of radiation to right  chest wall on 07/28/14.Skin is still healing with dry peel anterior and posterior wall.Continue application of biafine.Given aquaphor to apply at bedtime.Denies pain just tenderness.No other concerns voiced.scheduled for follow up with Dr.Feng.

## 2014-08-17 NOTE — Progress Notes (Signed)
   Department of Radiation Oncology  Phone:  [phone] Fax:        [phone]   Name: Carmen Stone MRN: [id number]  DOB: [birth year]  Date: 08/17/2014  Follow Up Visit Note  Diagnosis: Inflammatory breast cancer   Staging form: Breast, AJCC 7th Edition     Pathologic stage from 05/17/2014: Stage IIIB (T4d, N1, cM0) - Signed by Thea Silversmith, MD on 05/17/2014  Summary and Interval since last radiation: 50.4 Gy to the right chest wall completed 07/28/14  Interval History: Carmen Stone presents today for routine followup.  She has healed up well. She is still sore and peeling over her right chest wall. She is meeting with our survivorship navigator following this. Sh is using biafene. She had a negative staging study with Dr. Burr Medico and is excited about that.   Physical Exam:  Filed Vitals:   08/17/14 1503  BP: 126/62  Pulse: 91  Temp: 98 F (36.7 C)  Weight: 161 lb 11.2 oz (73.347 kg)   Dry desquamation and skin darkening continues over right chest wall. There is mostly healed skin.   IMPRESSION: Carmen Stone is a 59 y.o. female s/p radiation to the right chest wall with risk factors of inflammatory cancer and positive margins  PLAN:  She had an extremely exaggerated response to radiation and significant moist desquamation which has jus started to heal. I don't think she will benefit from a boost and she is not anxious to restart radiation.  We discussed the need for follow up every 4-6 months which she has scheduled.  We discussed the need for yearly mammograms on the other side which she can schedule with her OBGYN or with medical oncology. We discussed the need for sun protection in the treated area.  She can always call me with questions.  I will follow up with her on an as needed basis.      Thea Silversmith, MD

## 2014-08-17 NOTE — Progress Notes (Signed)
I met with Ms. Carmen Stone very briefly after her visit with Dr. Pablo Ledger to discuss survivorshp and schedule an appointment with me in the Survivorship Clinic at the request of Dr. Burr Medico.   She is now scheduled to see me on 09/05/14 at 2:30pm.   She was given my business card and encouraged to contact me with any questions or concerns she may have before her next appointment here.  I look forward to participating in her care.   Mike Craze, NP [place] [phone]

## 2014-09-05 ENCOUNTER — Ambulatory Visit (HOSPITAL_BASED_OUTPATIENT_CLINIC_OR_DEPARTMENT_OTHER): Payer: 59 | Admitting: Adult Health

## 2014-09-05 ENCOUNTER — Ambulatory Visit (HOSPITAL_BASED_OUTPATIENT_CLINIC_OR_DEPARTMENT_OTHER): Payer: 59

## 2014-09-05 ENCOUNTER — Encounter: Payer: Self-pay | Admitting: Adult Health

## 2014-09-05 VITALS — BP 125/78 | HR 87 | Temp 98.6°F | Resp 18

## 2014-09-05 VITALS — BP 110/57 | HR 89 | Temp 98.8°F | Resp 14

## 2014-09-05 DIAGNOSIS — C50911 Malignant neoplasm of unspecified site of right female breast: Secondary | ICD-10-CM

## 2014-09-05 DIAGNOSIS — R4589 Other symptoms and signs involving emotional state: Secondary | ICD-10-CM

## 2014-09-05 DIAGNOSIS — Z95828 Presence of other vascular implants and grafts: Secondary | ICD-10-CM

## 2014-09-05 MED ORDER — SODIUM CHLORIDE 0.9 % IJ SOLN
10.0000 mL | INTRAMUSCULAR | Status: DC | PRN
  Administered 2014-09-05: 10 mL via INTRAVENOUS
  Filled 2014-09-05: qty 10

## 2014-09-05 MED ORDER — HEPARIN SOD (PORK) LOCK FLUSH 100 UNIT/ML IV SOLN
500.0000 [IU] | Freq: Once | INTRAVENOUS | Status: AC
  Administered 2014-09-05: 500 [IU] via INTRAVENOUS
  Filled 2014-09-05: qty 5

## 2014-09-05 NOTE — Patient Instructions (Signed)

## 2014-09-05 NOTE — Progress Notes (Signed)
CLINIC:  Cancer Survivorship   REASON FOR VISIT:  Routine follow-up post-treatment for a recent history of breast cancer.  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    10/24/2013 Surgery Right total mastectomy of necrotizing breast tissue. Path revealed high grade carcinoma which extends into skin with involvement of dermal lymphatics.    10/25/2013 Imaging CT C/A/P negative for distant metastases.   10/26/2013 Surgery Completion right mastectomy & right axillary LN dissection: Surgical path showed high grade IDC, extending into skin. (+) LVI.  2/16 nodes positive for metastatic carcinoma. Due to prior surgery, tumor size difficult to measure, but at least T3.   11/03/2013 Echocardiogram Pre-chemo EF normal: 55-60%.    11/28/2013 - 01/09/2014 Adjuvant Chemotherapy Dose-dense AC x 4 completed.    01/23/2014 - 05/01/2014 Adjuvant Chemotherapy Weekly Carbo/Taxol x 12. [Cycle #2 had mild reaction to Taxol that resolved with treatment; Cycle #7 held d/t neutropenia, but received 1 week later].  Completed all 12 cycles.    06/20/2014 - 07/28/2014 Radiation Therapy Right chest wall and IM lymph nodes: total dose of 50.4 Gy over 28 fractions.  Right supraclavicular fossa and right axilla: total dose of 45 Gy over 25 fractions.    07/31/2014 Imaging Restaging CT c/a/p negative for distant mets.  Also bone scan negative for skeletal mets.     INTERVAL HISTORY:  Ms. Cline presents to the [facility] today for our initial meeting to review her survivorship care plan detailing her treatment course for breast cancer, as well as monitoring long-term side effects of that treatment, education regarding health maintenance, screening, and overall wellness and health promotion.     Overall, Ms. Scoggin reports  feeling relatively well since completing her radiation therapy about one month ago.  She endorses some right axilla pain and early morning stiffness to her left axilla and left chest wall s/p mastectomy.  She completed an initial evaluation with our oncology rehab/PT colleagues for range of motion and lymphedema concerns.  She reports "some extra skin" near her axilla and her mastectomy scar that causes her some discomfort.  This discomfort is relieved with placing a pillow under her arm.  She reports not having a great appetite, particularly in the morning for breakfast.  She reports that she wears baggy clothes because she is becoming more self-conscious about her mastectomy and the asymmetry of her breasts.  She has been fitted for a bra with prosthesis, but has questions regarding reconstruction of her breast.  She states that she does try to eat several small meals per day, but has challenges with swallowing certain foods, particularly meats (this has been a chronic issue for her).  She reports that her weight has been stable at around 160 lbs.   She endorses some fatigue, but this is improving.  She works 2 part-time jobs and really enjoys and takes pride in her work.  She expresses concerns about not being able to physically perform her job duties to the quality that she and her employers expect, given her recent treatment for cancer.  She expresses that her employers are very supportive of her health conditions though.  With regards to mood, Ms. Cerney states that she is often lonely, as she lives alone and only gets visits from her "daughter" (family friend) on the weekends.  She has minimal  social interaction, except at work and this makes her feel sad at times.  She expressed sadness regarding her spirituality and felt that she just couldn't muster up the "spirit" to go to church on Sundays and that has been causing her some distress as well.    REVIEW OF SYSTEMS:  General: Denies fever, chills,  unintentional weight loss.  Fatigue is present, but improving.  Cardiac: Denies palpitations, chest pain, and lower extremity edema.  Respiratory: Denies cough, shortness of breath, and dyspnea on exertion.  GI: Denies abdominal pain, constipation, diarrhea, nausea, or vomiting.  GU: Denies dysuria, hematuria, vaginal bleeding, vaginal discharge, or vaginal dryness.  Musculoskeletal: Denies joint or bone pain.  Neuro: Denies headache or recent falls. Denies peripheral neuropathy. Skin: Denies rash, pruritis, or open wounds.  Breast: Denies any new nodularity, masses, nipple changes, or nipple discharge.  Psych: Endorses depressed mood, but this improves with social interaction per her report.  She denies anxiety, insomnia, or memory loss.    A 14-point review of systems was completed and was negative, except as noted above.  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    10/24/2013 Surgery Right total mastectomy of necrotizing breast tissue. Path revealed high grade carcinoma which extends into skin with involvement of dermal lymphatics.    10/25/2013 Imaging CT C/A/P negative for distant metastases.   10/26/2013 Surgery Completion right mastectomy & right axillary LN dissection: Surgical path showed high grade IDC, extending into skin. (+) LVI.  2/16 nodes positive for metastatic carcinoma. Due to prior surgery, tumor size difficult to measure, but at least T3.   11/03/2013 Echocardiogram Pre-chemo EF normal: 55-60%.    11/28/2013 - 01/09/2014 Adjuvant Chemotherapy Dose-dense AC x 4 completed.    01/23/2014 - 05/01/2014 Adjuvant Chemotherapy Weekly Carbo/Taxol x 12. [Cycle #2 had mild reaction to Taxol that resolved with treatment; Cycle #7 held d/t neutropenia, but received 1 week later].  Completed all 12  cycles.    06/20/2014 - 07/28/2014 Radiation Therapy Right chest wall and IM lymph nodes: total dose of 50.4 Gy over 28 fractions.  Right supraclavicular fossa and right axilla: total dose of 45 Gy over 25 fractions.    07/31/2014 Imaging Restaging CT c/a/p negative for distant mets.  Also bone scan negative for skeletal mets.      ONCOLOGY TREATMENT TEAM:  1. Surgeon:  Dr. [name] at [facility] 2. Medical Oncologist: Dr. Burr Medico  3. Radiation Oncologist: Dr. Pablo Ledger    PAST MEDICAL/SURGICAL HISTORY:  Past Medical History  Diagnosis Date  . Diabetes mellitus, type II   . Inflammatory breast cancer 10/22/13    right  . Radiation 06/20/14-07/28/14   Past Surgical History  Procedure Laterality Date  . Cesarean section    . Ventral hernia repair  2011    repair incacerated VH with biologic mesh; cholecystectomy  . Cholecystectomy  2011  . Incision and drainage of wound Right 10/22/2013    Procedure: IRRIGATION AND DRAINAGE AND DEBRIDEMENT RIGHT BREAST WOUND;  Surgeon: [name], MD;  Location: [facility];  Service: General;  Laterality: Right;  . Breast biopsy Right 10/22/2013    Procedure: BREAST BIOPSY;  Surgeon: [name], MD;  Location: [facility];  Service: General;  Laterality: Right;  . Irrigation and debridement abscess Right 10/24/2013    Procedure: Right  total mastectomy;  Surgeon: [name], MD;  Location: [facility];  Service: General;  Laterality: Right;  . Dressing change under anesthesia Right 10/26/2013    Procedure: COMPLETION MASTECTOMY, AXILLARY DISSECTION;  Surgeon: [name], MD;  Location: [facility];  Service: General;  Laterality: Right;  . Portacath placement N/A 11/16/2013    Procedure: INSERTION PORT-A-CATH;  Surgeon: [name], MD;  Location: [facility];  Service: General;  Laterality: N/A;      CURRENT MEDICATIONS:  Outpatient Encounter Prescriptions as of 09/05/2014  Medication Sig Note  . acetaminophen (TYLENOL) 325 MG  tablet Take 650 mg by mouth every 6 (six) hours as needed for mild pain.   Marland Kitchen emollient (BIAFINE) cream Apply 90 g topically 2 (two) times daily.   Marland Kitchen glucose blood test strip Use as instructed   . glucose monitoring kit (FREESTYLE) monitoring kit 1 each by Does not apply route as needed for other. Check blood sugars daily.   . Insulin Glargine (LANTUS SOLOSTAR) 100 UNIT/ML Solostar Pen Inject 10 Units into the skin daily at 10 pm.   . lidocaine-prilocaine (EMLA) cream Apply 1 application topically as needed.   . loperamide (IMODIUM) 2 MG capsule Take 2 mg by mouth as needed for diarrhea or loose stools.   Marland Kitchen LORazepam (ATIVAN) 0.5 MG tablet Take 1 tablet (0.5 mg total) by mouth at bedtime as needed (Nausea or vomiting). (Patient not taking: Reported on 08/01/2014)   . ondansetron (ZOFRAN) 8 MG tablet Take 1 tablet (8 mg total) by mouth every 8 (eight) hours as needed for nausea or vomiting.   . pantoprazole (PROTONIX) 40 MG tablet  08/17/2014: Received from: External Pharmacy  . traMADol (ULTRAM) 50 MG tablet take 1 tablet by mouth every 6 hours if needed   . [EXPIRED] heparin lock flush 100 unit/mL    . [DISCONTINUED] sodium chloride 0.9 % injection 10 mL       ONCOLOGIC FAMILY HISTORY:  1. Mother: Lung cancer     SOCIAL HISTORY:  Carmen Stone is single and lives alone in [place], [place]. She has a close family friend, who is like a daughter to her that visits on the weekends.  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  She denies any current or history of tobacco, alcohol, or illicit drug use.     PHYSICAL EXAMINATION:  Vital Signs:   Filed Vitals:   09/05/14 1530  BP: 110/57  Pulse: 89  Temp: 98.8 F (37.1 C)  Resp: 14   General: Well-nourished, well-appearing female in no acute distress.  She is unaccompanied in clinic today.   HEENT: Head is atraumatic and normocephalic.  Pupils equal  and reactive to light and accomodation. Conjunctivae clear without exudate.  Sclerae anicteric. Oral mucosa is pink, moist, and intact without lesions.  Oropharynx is pink without lesions or erythema.  Lymph: No cervical, supraclavicular, infraclavicular, or axillary lymphadenopathy noted on palpation.  Cardiovascular: Regular rate and rhythm without murmurs, rubs, or gallops. Respiratory: Clear to auscultation bilaterally. Chest expansion symmetric without accessory muscle use on inspiration or expiration.  GI: Abdomen soft and round. No tenderness to palpation. Bowel sounds normoactive in 4 quadrants. No hepatosplenomegaly.   GU: Deferred.  Musculoskeletal: Muscle strength 5/5 in all extremities.  Mild limited ROM in right shoulder.  Neuro: No focal deficits. Steady gait.  Psych: Mood and affect normal and appropriate for situation.  Extremities: No  edema, cyanosis, or clubbing.  Skin: Warm and dry. No open lesions noted.   LABORATORY DATA:  None for this visit.  DIAGNOSTIC IMAGING:  None for this visit.      ASSESSMENT AND PLAN:   1. History of breast cancer:  Ms. Harriss will follow-up with her medical oncologist, Dr. Burr Medico in clinic in May 2016 with history and physical exam per surveillance protocol.  We talked about the high risk of recurrence for her breast cancer, given that it was a Stage IIIB triple negative inflammatory breast cancer on diagnosis.  Her most recent staging evaluation with CT chest/abdomen/pelvis and bone scan on 07/31/14 were negative for distant metastases.  She understands that she will need  frequent follow-up for the first 2 years (every 3-4 months) given the high risk of recurrence during that time period.  She will then need follow-up every 6 months for Survivor Years 3-5.  She verbalized understanding of this plan.  A comprehensive survivorship care plan and treatment summary was reviewed with the patient today detailing her breast cancer diagnosis, treatment  course, potential late/long-term effects of treatment, appropriate follow-up care with recommendations for the future, and patient education resources.  A copy of this summary, along with a letter will be sent to the patient's primary care provider, will be mailed/routed after today's visit.  Ms. Bilski is welcome to return to the Survivorship Clinic in the future, as needed; no follow-up will be scheduled at this time.    2. Adjustment disorder with depressed mood: This is likely multifactorial and is likely related to the traumatic event of a cancer diagnosis and subsequent treatment affecting her ability to effectively cope independently.  Her mood today in clinic was not depressed, but she endorses having days where she feels "down and depressed" which she attributes to her lack of social interaction, loneliness, and concerns regarding her financial situation.  Therefore, I have consulted our social work team to follow-up with her regarding potential resources of support.  I have also encouraged Ms. Luoma to speak with one of our financial advocates regarding her medical bills/financial/insurance concerns, as they are aware of her case and were able to assist her during active treatment. I have also consulted our spiritual care colleagues, as well as our counseling team to assist this patient with coping, grief, and adjustment to illness/life after cancer, and her expressed spiritual distress.  I have also gotten her registered for our next session of the "Finding Your New Normal" [facility]) series where a lot of these issues are addressed in a group setting with other survivors.   3.  Altered body image and self-esteem:  Ms. Lapier is beginning to process how much her body has changed s/p mastectomy, chemotherapy, and radiation for her breast cancer. She has been fitted for a bra with prosthesis, but has questions regarding surgical reconstruction of her breast.  I explained to her that likely the option  for immediate reconstruction was not appropriate for her given the nature of the necrotic mass and risk for infection.  We also discussed that while having surgical breast reconstruction after radiation therapy is not impossible, it does have the potential to make things more difficult. I have encouraged her to discuss those options with Dr. [name] at her next follow-up with him in May. With regards to coping, she reports that she is having trouble adjusting to losing her breast and how that affects her overall body image/self-esteem.  I have registered her for the next session of  the "Look Good.Marland KitchenMarland KitchenFeel Better" class to provide her with some tips regarding improving her physical appearance after cancer treatments, as well as provide an avenue for fellowship with other female cancer survivors.    4.  Risk for lymphedema and decreased upper extremity mobility: Given that she is s/p mastectomy with axillary lymph node dissection, she is certainly at risk for the associated morbidity of lymphedema and decreased ROM in the right arm.  She is already being followed by our outpatient oncology rehab/PT team, but I have also submitted a referral for her to attend the next session of the free "After Breast Cancer" (ABC) class, which offers additional information on risk reduction and management strategies for lymphedema, ROM, and other concerns related to treatment effects for breast cancer.   5. Bone health:  Given Ms. Mucha's age and history of breast cancer, she is at risk for bone demineralization.  Per our records, she has not received a DEXA scan.  She would be eligible at age 69, or earlier if clinically indicated by her PCP.  In the meantime, she was encouraged to increase her consumption of foods rich in calcium, as well as increase her weight-bearing activities.  She was given education on specific activities to promote bone health.  6. Cancer screening:  Due to Ms. Nerio's history and her age, she  should receive screening for skin cancers, colon cancer, and gynecologic cancers.  The information and recommendations are listed on the patient's comprehensive care plan/treatment summary and were reviewed in detail with the patient.    7. Health maintenance and wellness promotion: Ms. Fiebelkorn was encouraged to consume 5-7 servings of fruits and vegetables per day. She was also encouraged to engage in moderate to vigorous exercise for 30 minutes per day most days of the week. She was instructed to limit her alcohol consumption and continue to abstain from tobacco use.    8. Support services/counseling: It is not uncommon for this period of the patient's cancer care trajectory to be one of many emotions and stressors.  Ms. Brookover was encouraged to take advantage of our many support services programs, support groups, and/or counseling in coping with her new life as a cancer survivor after completing anti-cancer treatment.  She was offered support today through active listening and expressive supportive counseling.  She was given information regarding our available services and encouraged to contact me with any questions or for help enrolling in any of our support group/programs.    A total of 60 minutes of face-to-face time was spent with this patient with greater than 50% of that time in counseling and care-coordination.   Mike Craze, NP Survivorship Program [facility] [phone]   Note: PRIMARY CARE PROVIDER [name], [place] [phone]

## 2014-09-08 ENCOUNTER — Encounter: Payer: Self-pay | Admitting: *Deleted

## 2014-09-08 NOTE — Progress Notes (Signed)
[place] Work  Clinical Social Work was referred by nurse for assessment of psychosocial needs.  Clinical Social Worker has worked with pt extensively throughout her treatment and phoned her to check in. Pt is registered for [facility] that starts in April and is looking forward to it. She reports to be open to meeting with the counseling interns, but feels her main issue is still related to her finances. CSW and pt have sought out all available resources to date. CSW explained to pt that if there is a new fund that comes available, CSW will reach out to her. Pt stated understanding and was very appreciative.     Clinical Social Work interventions: Supportive listening provided  Resource education  Carmen Stone, [place] Worker [place]  [place] Phone: [phone] Fax: [phone]

## 2014-09-14 ENCOUNTER — Encounter: Payer: Self-pay | Admitting: Adult Health

## 2014-09-27 ENCOUNTER — Telehealth: Payer: Self-pay | Admitting: *Deleted

## 2014-09-27 MED ORDER — TRAMADOL HCL 50 MG PO TABS: ORAL_TABLET | ORAL | Status: DC

## 2014-09-27 NOTE — Telephone Encounter (Signed)
Received TC from patient requesting refill on her Tramadol. Refill called in to [facility] @ [facility]. Pt. aware.

## 2014-09-27 NOTE — Telephone Encounter (Signed)
Thanks

## 2014-10-10 NOTE — Progress Notes (Signed)
A birthday card was mailed to the patient today on behalf of the Survivorship Program at [facility].   Laithan Conchas, NP Survivorship Program [facility] [phone]  

## 2014-10-17 ENCOUNTER — Telehealth: Payer: Self-pay | Admitting: *Deleted

## 2014-10-17 MED ORDER — TRAMADOL HCL 50 MG PO TABS: ORAL_TABLET | ORAL | Status: DC

## 2014-10-17 NOTE — Telephone Encounter (Signed)
Pt cannot come today for flush- please add to day she sees Dr Burr Medico. Needs refill of tramadol

## 2014-11-07 ENCOUNTER — Telehealth: Payer: Self-pay | Admitting: Hematology

## 2014-11-07 ENCOUNTER — Ambulatory Visit (HOSPITAL_BASED_OUTPATIENT_CLINIC_OR_DEPARTMENT_OTHER): Payer: 59 | Admitting: Hematology

## 2014-11-07 ENCOUNTER — Other Ambulatory Visit: Payer: 59

## 2014-11-07 ENCOUNTER — Other Ambulatory Visit: Payer: BLUE CROSS/BLUE SHIELD

## 2014-11-07 ENCOUNTER — Other Ambulatory Visit (HOSPITAL_BASED_OUTPATIENT_CLINIC_OR_DEPARTMENT_OTHER): Payer: 59

## 2014-11-07 ENCOUNTER — Ambulatory Visit (HOSPITAL_BASED_OUTPATIENT_CLINIC_OR_DEPARTMENT_OTHER): Payer: 59

## 2014-11-07 ENCOUNTER — Encounter: Payer: Self-pay | Admitting: Hematology

## 2014-11-07 VITALS — BP 115/59 | HR 97 | Temp 98.7°F | Resp 18 | Ht 64.0 in | Wt 164.2 lb

## 2014-11-07 DIAGNOSIS — Z95828 Presence of other vascular implants and grafts: Secondary | ICD-10-CM

## 2014-11-07 DIAGNOSIS — E119 Type 2 diabetes mellitus without complications: Secondary | ICD-10-CM | POA: Diagnosis not present

## 2014-11-07 DIAGNOSIS — C50911 Malignant neoplasm of unspecified site of right female breast: Secondary | ICD-10-CM

## 2014-11-07 DIAGNOSIS — C50919 Malignant neoplasm of unspecified site of unspecified female breast: Secondary | ICD-10-CM

## 2014-11-07 DIAGNOSIS — Z853 Personal history of malignant neoplasm of breast: Secondary | ICD-10-CM

## 2014-11-07 DIAGNOSIS — R0902 Hypoxemia: Secondary | ICD-10-CM

## 2014-11-07 DIAGNOSIS — N611 Abscess of the breast and nipple: Secondary | ICD-10-CM

## 2014-11-07 DIAGNOSIS — Z171 Estrogen receptor negative status [ER-]: Secondary | ICD-10-CM

## 2014-11-07 LAB — CBC WITH DIFFERENTIAL/PLATELET
BASO%: 0.3 % (ref 0.0–2.0)
Basophils Absolute: 0 10*3/uL (ref 0.0–0.1)
EOS ABS: 0.3 10*3/uL (ref 0.0–0.5)
EOS%: 4.5 % (ref 0.0–7.0)
HEMATOCRIT: 34 % — AB (ref 34.8–46.6)
HGB: 11.4 g/dL — ABNORMAL LOW (ref 11.6–15.9)
LYMPH%: 23.4 % (ref 14.0–49.7)
MCH: 30.8 pg (ref 25.1–34.0)
MCHC: 33.5 g/dL (ref 31.5–36.0)
MCV: 91.9 fL (ref 79.5–101.0)
MONO#: 0.6 10*3/uL (ref 0.1–0.9)
MONO%: 8.2 % (ref 0.0–14.0)
NEUT#: 4.7 10*3/uL (ref 1.5–6.5)
NEUT%: 63.6 % (ref 38.4–76.8)
PLATELETS: 242 10*3/uL (ref 145–400)
RBC: 3.7 10*6/uL (ref 3.70–5.45)
RDW: 13.5 % (ref 11.2–14.5)
WBC: 7.4 10*3/uL (ref 3.9–10.3)
lymph#: 1.7 10*3/uL (ref 0.9–3.3)

## 2014-11-07 LAB — COMPREHENSIVE METABOLIC PANEL (CC13)
ALBUMIN: 3.7 g/dL (ref 3.5–5.0)
ALT: 6 U/L (ref 0–55)
ANION GAP: 12 meq/L — AB (ref 3–11)
AST: 15 U/L (ref 5–34)
Alkaline Phosphatase: 91 U/L (ref 40–150)
BUN: 17 mg/dL (ref 7.0–26.0)
CALCIUM: 8.5 mg/dL (ref 8.4–10.4)
CHLORIDE: 106 meq/L (ref 98–109)
CO2: 22 mEq/L (ref 22–29)
Creatinine: 0.8 mg/dL (ref 0.6–1.1)
Glucose: 182 mg/dl — ABNORMAL HIGH (ref 70–140)
Potassium: 3.8 mEq/L (ref 3.5–5.1)
Sodium: 140 mEq/L (ref 136–145)
Total Protein: 6.7 g/dL (ref 6.4–8.3)

## 2014-11-07 MED ORDER — HEPARIN SOD (PORK) LOCK FLUSH 100 UNIT/ML IV SOLN
500.0000 [IU] | Freq: Once | INTRAVENOUS | Status: AC
Start: 2014-11-07 — End: 2014-11-07
  Administered 2014-11-07: 500 [IU] via INTRAVENOUS
  Filled 2014-11-07: qty 5

## 2014-11-07 MED ORDER — TRAMADOL HCL 50 MG PO TABS: ORAL_TABLET | ORAL | Status: DC

## 2014-11-07 MED ORDER — SODIUM CHLORIDE 0.9 % IJ SOLN
10.0000 mL | INTRAMUSCULAR | Status: DC | PRN
  Administered 2014-11-07: 10 mL via INTRAVENOUS
  Filled 2014-11-07: qty 10

## 2014-11-07 NOTE — Telephone Encounter (Signed)
per pof to sch pt appt-per pt she has no idea where prev mamma was-cld [facility] one has record on file-cld & spoke to Dr Burr Medico and she stated she will investigate chart no to sch mamma now until after her reply

## 2014-11-07 NOTE — Progress Notes (Signed)
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    10/24/2013 Surgery Right total mastectomy of necrotizing breast tissue. Path revealed high grade carcinoma which extends into skin with involvement of dermal lymphatics.    10/25/2013 Imaging CT C/A/P negative for distant metastases.   10/26/2013 Surgery Completion right mastectomy & right axillary LN dissection: Surgical path showed high grade IDC, extending into skin. (+) LVI.  2/16 nodes positive for metastatic carcinoma. Due to prior surgery, tumor size difficult to measure, but at least T3.   11/03/2013 Echocardiogram Pre-chemo EF normal: 55-60%.    11/28/2013 - 01/09/2014 Adjuvant Chemotherapy Dose-dense AC x 4 completed.    01/23/2014 - 05/01/2014 Adjuvant Chemotherapy Weekly Carbo/Taxol x 12. [Cycle #2 had mild reaction to Taxol that resolved with treatment; Cycle #7 held d/t neutropenia, but received 1 week later].  Completed all 12 cycles.    06/20/2014 - 07/28/2014 Radiation Therapy Right chest wall and IM lymph nodes: total dose of 50.4 Gy over 28 fractions.  Right supraclavicular fossa and right axilla: total dose of 45 Gy over 25 fractions.    07/31/2014 Imaging Restaging CT c/a/p negative for distant mets.  Also bone scan negative for skeletal mets.    CURRENT THERAPY: Observation  INTERVAL HISTORY: She returns for follow up. She is doing well overall, has recovered better  lately, and went back to work one month ago. She still has mild discomfort at the right breast surgical site, takes tramadol, mild right ear stuffness, no other new complains. Her appetite is mpderate, but she has gained some weight back.   REVIEW OF SYSTEMS:   Constitutional: Denies fevers, chills or abnormal weight loss Eyes: Denies blurriness of vision Ears, nose, mouth, throat, and face: Denies mucositis or sore throat Respiratory: Denies cough, dyspnea or wheezes Cardiovascular: Denies palpitation, chest discomfort or lower extremity swelling Gastrointestinal:  Denies nausea, heartburn or change in bowel habits Skin: Denies abnormal skin rashes Lymphatics: Denies new lymphadenopathy or easy bruising Neurological:Denies numbness, tingling or new weaknesses Behavioral/Psych: Mood is stable, no new changes  All other systems were reviewed with the patient and are negative.  MEDICAL HISTORY:  Past Medical History  Diagnosis Date  . Diabetes mellitus, type II   . Inflammatory breast cancer 10/22/13    right  . Radiation 06/20/14-07/28/14    SURGICAL HISTORY: Past Surgical History  Procedure Laterality Date  . Cesarean section    . Ventral hernia repair  2011    repair incacerated VH with biologic mesh; cholecystectomy  . Cholecystectomy  2011  . Incision and drainage of wound Right 10/22/2013    Procedure: IRRIGATION AND DRAINAGE AND DEBRIDEMENT RIGHT BREAST WOUND;  Surgeon: [name], MD;  Location: [place];  Service: General;  Laterality: Right;  . Breast biopsy Right 10/22/2013    Procedure: BREAST BIOPSY;  Surgeon: [name], MD;  Location: [place];  Service: General;  Laterality: Right;  . Irrigation and debridement abscess Right 10/24/2013    Procedure: Right total mastectomy;  Surgeon: Rodman Key  [name], MD;  Location: [place];  Service: General;  Laterality: Right;  . Dressing change under anesthesia Right 10/26/2013    Procedure: COMPLETION MASTECTOMY, AXILLARY DISSECTION;   Surgeon: [name], MD;  Location: [place];  Service: General;  Laterality: Right;  . Portacath placement N/A 11/16/2013    Procedure: INSERTION PORT-A-CATH;  Surgeon: [name], MD;  Location: [place];  Service: General;  Laterality: N/A;    I have reviewed the social history and family history with the patient and they are unchanged from previous note.  ALLERGIES:  has No Known Allergies.  MEDICATIONS:  Current Outpatient Prescriptions  Medication Sig Dispense Refill  . acetaminophen (TYLENOL) 325 MG tablet Take 650 mg by mouth every 6 (six) hours as needed for mild pain.    Marland Kitchen emollient (BIAFINE) cream Apply 90 g topically 2 (two) times daily.    Marland Kitchen glucose blood test strip Use as instructed 100 each 12  . glucose monitoring kit (FREESTYLE) monitoring kit 1 each by Does not apply route as needed for other. Check blood sugars daily. 1 each 0  . Insulin Glargine (LANTUS SOLOSTAR) 100 UNIT/ML Solostar Pen Inject 10 Units into the skin daily at 10 pm. 15 mL 3  . lidocaine-prilocaine (EMLA) cream Apply 1 application topically as needed. 30 g 0  . loperamide (IMODIUM) 2 MG capsule Take 2 mg by mouth as needed for diarrhea or loose stools.    Marland Kitchen LORazepam (ATIVAN) 0.5 MG tablet Take 1 tablet (0.5 mg total) by mouth at bedtime as needed (Nausea or vomiting). 30 tablet 0  . ondansetron (ZOFRAN) 8 MG tablet Take 1 tablet (8 mg total) by mouth every 8 (eight) hours as needed for nausea or vomiting. 20 tablet 3  . pantoprazole (PROTONIX) 40 MG tablet   0  . traMADol (ULTRAM) 50 MG tablet take 1 tablet by mouth every 6 hours if needed 60 tablet 1   No current facility-administered medications for this visit.   Facility-Administered Medications Ordered in Other Visits  Medication Dose Route Frequency Provider Last Rate Last Dose  . sodium chloride 0.9 % injection 10 mL  10 mL Intravenous PRN Chauncey Cruel, MD   10 mL at 11/07/14 1400    PHYSICAL EXAMINATION: ECOG  PERFORMANCE STATUS: 0 - Asymptomatic  Filed Vitals:   11/07/14 1418  BP: 115/59  Pulse: 97  Temp: 98.7 F (37.1 C)  Resp: 18   Filed Weights   11/07/14 1418  Weight: 164 lb 3.2 oz (74.481 kg)    GENERAL:alert, no distress and comfortable SKIN: skin color, texture, turgor are normal, no rashes.  EYES: normal, Conjunctiva are pink and non-injected, sclera clear OROPHARYNX:no exudate, no erythema and lips, buccal mucosa, and tongue normal  NECK: supple, thyroid normal size, non-tender, without nodularity LYMPH:  no palpable lymphadenopathy in the cervical, axillary or inguinal LUNGS: clear to auscultation and percussion with normal breathing effort HEART: regular rate & rhythm and no murmurs and no lower extremity edema ABDOMEN:abdomen soft, non-tender and normal bowel sounds Musculoskeletal:no cyanosis of digits and no clubbing  NEURO: alert & oriented x 3 with fluent speech, no focal motor/sensory deficits Breast: Right breast surgically absent, (+) significant skin pigmentation at the right front chest radiation area, skin intact. There is a large soft tissue or subcutaneous fluid collection on the lateral chest wall, very soft, no palpable mass. Lleft breast exam showed no palpable mass, no skin change or nipple discharge. No palpable nodes on bilateral axilla.  LABORATORY  DATA:  I have reviewed the data as listed CBC Latest Ref Rng 11/07/2014 07/31/2014 05/22/2014  WBC 3.9 - 10.3 10e3/uL 7.4 6.1 6.3  Hemoglobin 11.6 - 15.9 g/dL 11.4(L) 11.7 10.0(L)  Hematocrit 34.8 - 46.6 % 34.0(L) 35.4 30.6(L)  Platelets 145 - 400 10e3/uL 242 228 330     CMP Latest Ref Rng 07/31/2014 05/22/2014 05/01/2014  Glucose 70 - 140 mg/dl 97 182(H) 138  BUN 7.0 - 26.0 mg/dL 16.6 10.6 11.4  Creatinine 0.6 - 1.1 mg/dL 0.7 0.8 0.6  Sodium 136 - 145 mEq/L 141 141 140  Potassium 3.5 - 5.1 mEq/L 4.2 3.6 4.0  Chloride 96 - 112 mEq/L - - -  CO2 22 - 29 mEq/L '26 26 26  ' Calcium 8.4 - 10.4 mg/dL 9.0 9.1 9.0   Total Protein 6.4 - 8.3 g/dL 6.9 6.4 6.1(L)  Total Bilirubin 0.20 - 1.20 mg/dL 0.20 <0.20 0.30  Alkaline Phos 40 - 150 U/L 103 101 83  AST 5 - 34 U/L '13 14 12  ' ALT 0 - 55 U/L '9 9 12   ' CA27.29 is still pending   RADIOGRAPHIC STUDIES: I have personally reviewed the radiological images as listed and agreed with the findings in the report. No results found.   ASSESSMENT & PLAN:  59 year old female with past medical history of diabetes, presented with a right inflammatory breast cancer, #1. status post right modified radical mastectomy 10/26/2013 for a cT4, pT3 pN1, stage IIIB invasive ductal carcinoma, grade 3, triple negative.  1. 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 -I discussed her restaging CT chest and abdomen, which are negative for skin cancer recurrence. -We discussed the high risk for cancer recurrence, especially in the first 2-3 years. I recommend closer follow-up every 3-4 months for the first 2 years, then every 6-12 months for additional 3 years.  -there is no role for adjuvant endocrine therapy. -We'll continue annual screening mammogram of the left breast. She is due, we'll schedule her as soon as possible.  2. Diabetes, type 2 She will follow-up with her primary physician  Follow-up: I'll see her back in 3 months with lab. I ordered screening left breast mammogram to be done in the next few weeks.  All questions were answered. The patient knows to call the clinic with any problems, questions or concerns. No barriers to learning was detected.  I spent 20 minutes counseling the patient face to face. The total time spent in the appointment was 25 minutes and more than 50% was on counseling and review of test results     Truitt Merle, MD 11/07/2014 2:38 PM

## 2014-11-07 NOTE — Patient Instructions (Signed)

## 2014-11-08 LAB — CANCER ANTIGEN 27.29: CA 27.29: 7 U/mL (ref 0–39)

## 2014-11-27 ENCOUNTER — Other Ambulatory Visit: Payer: Self-pay | Admitting: Hematology

## 2014-11-27 ENCOUNTER — Telehealth: Payer: Self-pay | Admitting: Hematology

## 2014-11-27 DIAGNOSIS — C50911 Malignant neoplasm of unspecified site of right female breast: Secondary | ICD-10-CM

## 2014-11-27 NOTE — Telephone Encounter (Signed)
per pof to sch pt mamma-cld & spoke to pt & gave pt time/date/location-pt understoo

## 2014-12-05 ENCOUNTER — Ambulatory Visit (HOSPITAL_BASED_OUTPATIENT_CLINIC_OR_DEPARTMENT_OTHER): Payer: 59

## 2014-12-05 VITALS — BP 132/65 | HR 88 | Temp 98.9°F | Resp 18

## 2014-12-05 DIAGNOSIS — Z452 Encounter for adjustment and management of vascular access device: Secondary | ICD-10-CM | POA: Diagnosis not present

## 2014-12-05 DIAGNOSIS — Z853 Personal history of malignant neoplasm of breast: Secondary | ICD-10-CM | POA: Diagnosis not present

## 2014-12-05 DIAGNOSIS — Z95828 Presence of other vascular implants and grafts: Secondary | ICD-10-CM

## 2014-12-05 MED ORDER — HEPARIN SOD (PORK) LOCK FLUSH 100 UNIT/ML IV SOLN
500.0000 [IU] | Freq: Once | INTRAVENOUS | Status: AC
  Administered 2014-12-05: 500 [IU] via INTRAVENOUS
  Filled 2014-12-05: qty 5

## 2014-12-05 MED ORDER — SODIUM CHLORIDE 0.9 % IJ SOLN
10.0000 mL | INTRAMUSCULAR | Status: DC | PRN
  Administered 2014-12-05: 10 mL via INTRAVENOUS
  Filled 2014-12-05: qty 10

## 2014-12-05 NOTE — Patient Instructions (Signed)

## 2014-12-11 ENCOUNTER — Other Ambulatory Visit: Payer: Self-pay | Admitting: Hematology

## 2014-12-11 ENCOUNTER — Ambulatory Visit
Admission: RE | Admit: 2014-12-11 | Discharge: 2014-12-11 | Disposition: A | Discharge status: Home / Self Care | Payer: 59 | Source: Ambulatory Visit | Attending: Hematology | Admitting: Hematology

## 2014-12-11 DIAGNOSIS — C50911 Malignant neoplasm of unspecified site of right female breast: Secondary | ICD-10-CM

## 2014-12-11 DIAGNOSIS — Z1231 Encounter for screening mammogram for malignant neoplasm of breast: Secondary | ICD-10-CM

## 2014-12-12 ENCOUNTER — Other Ambulatory Visit: Payer: Self-pay | Admitting: Hematology

## 2014-12-12 DIAGNOSIS — R928 Other abnormal and inconclusive findings on diagnostic imaging of breast: Secondary | ICD-10-CM

## 2015-01-02 ENCOUNTER — Ambulatory Visit (HOSPITAL_BASED_OUTPATIENT_CLINIC_OR_DEPARTMENT_OTHER): Payer: 59

## 2015-01-02 ENCOUNTER — Ambulatory Visit
Admission: RE | Admit: 2015-01-02 | Discharge: 2015-01-02 | Disposition: A | Discharge status: Home / Self Care | Payer: 59 | Source: Ambulatory Visit | Attending: Hematology | Admitting: Hematology

## 2015-01-02 ENCOUNTER — Telehealth: Payer: Self-pay | Admitting: *Deleted

## 2015-01-02 VITALS — BP 114/64 | HR 94 | Temp 98.7°F

## 2015-01-02 DIAGNOSIS — Z853 Personal history of malignant neoplasm of breast: Secondary | ICD-10-CM

## 2015-01-02 DIAGNOSIS — R928 Other abnormal and inconclusive findings on diagnostic imaging of breast: Secondary | ICD-10-CM

## 2015-01-02 DIAGNOSIS — Z452 Encounter for adjustment and management of vascular access device: Secondary | ICD-10-CM | POA: Diagnosis not present

## 2015-01-02 DIAGNOSIS — Z95828 Presence of other vascular implants and grafts: Secondary | ICD-10-CM

## 2015-01-02 MED ORDER — SODIUM CHLORIDE 0.9 % IJ SOLN
10.0000 mL | INTRAMUSCULAR | Status: DC | PRN
  Administered 2015-01-02: 10 mL via INTRAVENOUS
  Filled 2015-01-02: qty 10

## 2015-01-02 MED ORDER — HEPARIN SOD (PORK) LOCK FLUSH 100 UNIT/ML IV SOLN
500.0000 [IU] | Freq: Once | INTRAVENOUS | Status: AC
Start: 2015-01-02 — End: 2015-01-02
  Administered 2015-01-02: 500 [IU] via INTRAVENOUS
  Filled 2015-01-02: qty 5

## 2015-01-02 MED ORDER — TRAMADOL HCL 50 MG PO TABS: ORAL_TABLET | ORAL | Status: DC

## 2015-01-02 NOTE — Telephone Encounter (Signed)
Patient is here for PAC flush and requests refill of her tramadol.  Last was 11-07-14 #60 with one refill for post surgical right breast discomfort.  She is now complaining of left shoulder pain.  It is difficult to raise her arm.  Pain is  A 8/10.  Dull numbing pain with lifting.  Discussed with Dr. Burr Medico.  This is not why she prescribed this.  She needs to see her PCP for this.  Will give her #30 per Dr. Burr Medico to allow her to get thru until visit with PCP.  Patient has PCP and is fine with this.  She will make appt. To see PCP

## 2015-01-02 NOTE — Patient Instructions (Signed)

## 2015-01-30 ENCOUNTER — Ambulatory Visit (HOSPITAL_BASED_OUTPATIENT_CLINIC_OR_DEPARTMENT_OTHER): Payer: 59

## 2015-01-30 ENCOUNTER — Encounter: Payer: Self-pay | Admitting: Hematology

## 2015-01-30 ENCOUNTER — Telehealth: Payer: Self-pay | Admitting: Hematology

## 2015-01-30 ENCOUNTER — Ambulatory Visit (HOSPITAL_BASED_OUTPATIENT_CLINIC_OR_DEPARTMENT_OTHER): Payer: 59 | Admitting: Hematology

## 2015-01-30 VITALS — BP 124/64 | HR 81 | Temp 98.6°F | Resp 18 | Ht 64.0 in | Wt 169.4 lb

## 2015-01-30 DIAGNOSIS — Z853 Personal history of malignant neoplasm of breast: Secondary | ICD-10-CM

## 2015-01-30 DIAGNOSIS — C50911 Malignant neoplasm of unspecified site of right female breast: Secondary | ICD-10-CM

## 2015-01-30 DIAGNOSIS — E119 Type 2 diabetes mellitus without complications: Secondary | ICD-10-CM | POA: Diagnosis not present

## 2015-01-30 DIAGNOSIS — Z95828 Presence of other vascular implants and grafts: Secondary | ICD-10-CM

## 2015-01-30 LAB — COMPREHENSIVE METABOLIC PANEL (CC13)
ALT: 16 U/L (ref 0–55)
ANION GAP: 7 meq/L (ref 3–11)
AST: 14 U/L (ref 5–34)
Albumin: 3.8 g/dL (ref 3.5–5.0)
Alkaline Phosphatase: 134 U/L (ref 40–150)
BUN: 12.8 mg/dL (ref 7.0–26.0)
CHLORIDE: 106 meq/L (ref 98–109)
CO2: 26 meq/L (ref 22–29)
CREATININE: 0.9 mg/dL (ref 0.6–1.1)
Calcium: 9.2 mg/dL (ref 8.4–10.4)
EGFR: 77 mL/min/{1.73_m2} — ABNORMAL LOW (ref >90)
Glucose: 268 mg/dl — ABNORMAL HIGH (ref 70–140)
POTASSIUM: 3.8 meq/L (ref 3.5–5.1)
Sodium: 139 mEq/L (ref 136–145)
Total Bilirubin: <0.2 mg/dL (ref 0.20–1.20)
Total Protein: 7.1 g/dL (ref 6.4–8.3)

## 2015-01-30 LAB — CBC WITH DIFFERENTIAL/PLATELET
BASO%: 0.3 % (ref 0.0–2.0)
BASOS ABS: 0 10*3/uL (ref 0.0–0.1)
EOS ABS: 0.2 10*3/uL (ref 0.0–0.5)
EOS%: 2.8 % (ref 0.0–7.0)
HEMATOCRIT: 36.1 % (ref 34.8–46.6)
HGB: 12.4 g/dL (ref 11.6–15.9)
LYMPH%: 23 % (ref 14.0–49.7)
MCH: 30.7 pg (ref 25.1–34.0)
MCHC: 34.3 g/dL (ref 31.5–36.0)
MCV: 89.4 fL (ref 79.5–101.0)
MONO#: 0.5 10*3/uL (ref 0.1–0.9)
MONO%: 6.5 % (ref 0.0–14.0)
NEUT#: 4.8 10*3/uL (ref 1.5–6.5)
NEUT%: 67.4 % (ref 38.4–76.8)
NRBC: 0 % (ref 0–0)
PLATELETS: 244 10*3/uL (ref 145–400)
RBC: 4.04 10*6/uL (ref 3.70–5.45)
RDW: 12.9 % (ref 11.2–14.5)
WBC: 7.2 10*3/uL (ref 3.9–10.3)
lymph#: 1.7 10*3/uL (ref 0.9–3.3)

## 2015-01-30 MED ORDER — TRAMADOL HCL 50 MG PO TABS: ORAL_TABLET | ORAL | Status: DC

## 2015-01-30 MED ORDER — HEPARIN SOD (PORK) LOCK FLUSH 100 UNIT/ML IV SOLN
500.0000 [IU] | Freq: Once | INTRAVENOUS | Status: AC
  Administered 2015-01-30: 500 [IU] via INTRAVENOUS
  Filled 2015-01-30: qty 5

## 2015-01-30 MED ORDER — SODIUM CHLORIDE 0.9 % IJ SOLN
10.0000 mL | INTRAMUSCULAR | Status: DC | PRN
  Administered 2015-01-30: 10 mL via INTRAVENOUS
  Filled 2015-01-30: qty 10

## 2015-01-30 NOTE — Progress Notes (Signed)
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    10/24/2013 Surgery Right total mastectomy of necrotizing breast tissue. Path revealed high grade carcinoma which extends into skin with involvement of dermal lymphatics.    10/25/2013 Imaging CT C/A/P negative for distant metastases.   10/26/2013 Surgery Completion right mastectomy & right axillary LN dissection: Surgical path showed high grade IDC, extending into skin. (+) LVI.  2/16 nodes positive for metastatic carcinoma. Due to prior surgery, tumor size difficult to measure, but at least T3.   11/03/2013 Echocardiogram Pre-chemo EF normal: 55-60%.    11/28/2013 - 01/09/2014 Adjuvant Chemotherapy Dose-dense AC x 4 completed.    01/23/2014 - 05/01/2014 Adjuvant Chemotherapy Weekly Carbo/Taxol x 12. [Cycle #2 had mild reaction to Taxol that resolved with treatment; Cycle #7 held d/t neutropenia, but received 1 week later].  Completed all 12 cycles.    06/20/2014 - 07/28/2014 Radiation Therapy Right chest wall and IM lymph nodes: total dose of 50.4 Gy over 28 fractions.  Right supraclavicular fossa and right axilla: total dose of 45 Gy over 25 fractions.    07/31/2014 Imaging Restaging CT c/a/p negative for distant mets.  Also bone scan negative for skeletal mets.    CURRENT THERAPY: Observation  INTERVAL HISTORY: She returns for follow up. She is doing well overall, she uses her arms  frequently, and has intermittent mild pain at the right breast surgical site after exertion,  she takes tramadol once a day.  she still has the large fluid collection at the right side of her surgical scar, she is wondering if she needs to remove it. She otherwise is doing well, denies any other new pain, dyspnea, or GI symptoms.    REVIEW OF SYSTEMS:   Constitutional: Denies fevers, chills or abnormal weight loss Eyes: Denies blurriness of vision Ears, nose, mouth, throat, and face: Denies mucositis or sore throat Respiratory: Denies cough, dyspnea or wheezes Cardiovascular: Denies palpitation, chest discomfort or lower extremity swelling Gastrointestinal:  Denies nausea, heartburn or change in bowel habits Skin: Denies abnormal skin rashes Lymphatics: Denies new lymphadenopathy or easy bruising Neurological:Denies numbness, tingling or new weaknesses Behavioral/Psych: Mood is stable, no new changes  All other systems were reviewed with the patient and are negative.  MEDICAL HISTORY:  Past Medical History  Diagnosis Date  . Diabetes mellitus, type II   . Inflammatory breast cancer 10/22/13    right  . Radiation 06/20/14-07/28/14    SURGICAL HISTORY: Past Surgical History  Procedure Laterality Date  . Cesarean section    . Ventral hernia repair  2011    repair incacerated VH with biologic mesh; cholecystectomy  . Cholecystectomy  2011  . Incision and drainage of wound Right 10/22/2013    Procedure: IRRIGATION AND DRAINAGE AND DEBRIDEMENT RIGHT BREAST WOUND;  Surgeon: [name], MD;  Location: [facility];  Service: General;  Laterality: Right;  . Breast biopsy Right 10/22/2013    Procedure: BREAST BIOPSY;  Surgeon: [name], MD;  Location: [place];  Service: General;  Laterality: Right;  . Irrigation and debridement abscess Right 10/24/2013    Procedure: Right total mastectomy;  Surgeon: [name], MD;  Location: [place];  Service: General;  Laterality: Right;  . Dressing change  under anesthesia Right 10/26/2013    Procedure: COMPLETION MASTECTOMY, AXILLARY DISSECTION;  Surgeon: [name], MD;  Location: [place];  Service: General;  Laterality: Right;  . Portacath placement N/A 11/16/2013    Procedure: INSERTION PORT-A-CATH;  Surgeon: [name], MD;  Location: [place];  Service: General;  Laterality: N/A;    I have reviewed the social history and family history with the patient and they are unchanged from previous note.  ALLERGIES:  has No Known Allergies.  MEDICATIONS:  Current Outpatient Prescriptions  Medication Sig Dispense Refill  . acetaminophen (TYLENOL) 325 MG tablet Take 650 mg by mouth every 6 (six) hours as needed for mild pain.    Marland Kitchen emollient (BIAFINE) cream Apply 90 g topically 2 (two) times daily.    Marland Kitchen glucose blood test strip Use as instructed 100 each 12  . glucose monitoring kit (FREESTYLE) monitoring kit 1 each by Does not apply route as needed for other. Check blood sugars daily. 1 each 0  . Insulin Glargine (LANTUS SOLOSTAR) 100 UNIT/ML Solostar Pen Inject 10 Units into the skin daily at 10 pm. 15 mL 3  . lidocaine-prilocaine (EMLA) cream Apply 1 application topically as needed. 30 g 0  . loperamide (IMODIUM) 2 MG capsule Take 2 mg by mouth as needed for diarrhea or loose stools.    Marland Kitchen LORazepam (ATIVAN) 0.5 MG tablet Take 1 tablet (0.5 mg total) by mouth at bedtime as needed (Nausea or vomiting). (Patient not taking: Reported on 01/30/2015) 30 tablet 0  . ondansetron (ZOFRAN) 8 MG tablet Take 1 tablet (8 mg total) by mouth every 8 (eight) hours as needed for nausea or vomiting. (Patient not taking: Reported on 01/30/2015) 20 tablet 3  . pantoprazole (PROTONIX) 40 MG tablet   0  . traMADol (ULTRAM) 50 MG tablet take 1 tablet by mouth every 6 hours if needed 30 tablet 1   No current facility-administered medications for this visit.   Facility-Administered Medications Ordered in Other Visits  Medication Dose Route  Frequency Provider Last Rate Last Dose  . sodium chloride 0.9 % injection 10 mL  10 mL Intravenous PRN Truitt Merle, MD   10 mL at 01/30/15 1156    PHYSICAL EXAMINATION: ECOG PERFORMANCE STATUS: 0 - Asymptomatic  Filed Vitals:   01/30/15 1411  BP: 124/64  Pulse: 81  Temp: 98.6 F (37 C)  Resp: 18   Filed Weights   01/30/15 1411  Weight: 169 lb 6.4 oz (76.839 kg)    GENERAL:alert, no distress and comfortable SKIN: skin color, texture, turgor are normal, no rashes.  EYES: normal, Conjunctiva are pink and non-injected, sclera clear OROPHARYNX:no exudate, no erythema and lips, buccal mucosa, and tongue normal  NECK: supple, thyroid normal size, non-tender, without nodularity LYMPH:  no palpable lymphadenopathy in the cervical, axillary or inguinal LUNGS: clear to auscultation and percussion with normal breathing effort HEART: regular rate & rhythm and no murmurs and no lower extremity edema ABDOMEN:abdomen soft, non-tender and normal bowel sounds Musculoskeletal:no cyanosis of digits and no clubbing  NEURO: alert & oriented x 3 with fluent speech, no focal motor/sensory deficits Breast: Right breast surgically absent, (+) significant skin pigmentation at the right front chest radiation area, skin intact. There is a large soft tissue or subcutaneous fluid collection  on the lateral chest wall, very soft, no palpable mass. Lleft breast exam showed no palpable mass, no skin change or nipple discharge. No palpable nodes on bilateral axilla.  LABORATORY DATA:  I have reviewed the data as listed CBC Latest Ref Rng 11/07/2014 07/31/2014 05/22/2014  WBC 3.9 - 10.3 10e3/uL 7.4 6.1 6.3  Hemoglobin 11.6 - 15.9 g/dL 11.4(L) 11.7 10.0(L)  Hematocrit 34.8 - 46.6 % 34.0(L) 35.4 30.6(L)  Platelets 145 - 400 10e3/uL 242 228 330     CMP Latest Ref Rng 11/07/2014 07/31/2014 05/22/2014  Glucose 70 - 140 mg/dl 182(H) 97 182(H)  BUN 7.0 - 26.0 mg/dL 17.0 16.6 10.6  Creatinine 0.6 - 1.1 mg/dL 0.8 0.7 0.8   Sodium 136 - 145 mEq/L 140 141 141  Potassium 3.5 - 5.1 mEq/L 3.8 4.2 3.6  Chloride 96 - 112 mEq/L - - -  CO2 22 - 29 mEq/L '22 26 26  ' Calcium 8.4 - 10.4 mg/dL 8.5 9.0 9.1  Total Protein 6.4 - 8.3 g/dL 6.7 6.9 6.4  Total Bilirubin 0.20 - 1.20 mg/dL <0.20 0.20 <0.20  Alkaline Phos 40 - 150 U/L 91 103 101  AST 5 - 34 U/L '15 13 14  ' ALT 0 - 55 U/L '6 9 9   ' CA27.29 is still pending   RADIOGRAPHIC STUDIES: I have personally reviewed the radiological images as listed and agreed with the findings in the report.  Diagnostic mammogram and ultrasound 01/02/2015 IMPRESSION: Benign cyst in dilated retroareolar ducts in the left breast. Small calcified fibroadenoma. No evidence of malignancy.   ASSESSMENT & PLAN:  58 year old female with past medical history of diabetes, presented with a right inflammatory breast cancer, #1. status post right modified radical mastectomy 10/26/2013 for a cT4, pT3 pN1, stage IIIB invasive ductal carcinoma, grade 3, triple negative.  1. 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 -I discussed her restaging CT chest and abdomen, which are negative for cancer recurrence. -We discussed the high risk for cancer recurrence, especially in the first 2-3 years. I recommend closer follow-up every 3-4 months for the first 2 years, then every 6-12 months for additional 3 years.  -there is no role for adjuvant endocrine therapy. -We'll continue annual screening mammogram of the left breast. Her recent MRI was negative except for a benign cyst -I encouraged her to continue healthy diet and exercise regularly   2. Diabetes, type 2 She will follow-up with her primary physician  Follow-up: I'll see her back in 4 months with lab. Repeat lab today.   All questions were answered. The patient knows to call the clinic with any problems, questions or concerns. No barriers to learning was detected.  I spent  20 minutes counseling the patient face to face. The total time spent in the appointment was 25 minutes and more than 50% was on counseling and review of test results     Truitt Merle, MD 01/30/2015 2:50 PM

## 2015-01-30 NOTE — Telephone Encounter (Signed)
Add to previous note. Flush appointments added every 6 weeks per patient she still has a port.

## 2015-01-30 NOTE — Telephone Encounter (Signed)
Patient sent back to lab and given avs report and appointments for September thru December.

## 2015-01-30 NOTE — Patient Instructions (Signed)

## 2015-01-31 LAB — CANCER ANTIGEN 27.29: CA 27.29: 12 U/mL (ref 0–39)

## 2015-02-13 ENCOUNTER — Encounter: Payer: Self-pay | Admitting: Hematology

## 2015-02-13 NOTE — Progress Notes (Signed)
Rcvd email from [place] that pt is concerned that she has some bills now from [facility].  She is very concerned about this as she has an extremely fixed income and has really needed the CSW teams help with financial issues throughout treatment.  She planned to reach out to her PCP today.  After researching this, she is being billed because no referral was obtained however, Darlena has spoken to this pt several times informing her that she needed a PCP because the one thats listed on her chart is not the correct dr.  I also called the PCP on her chart and they told me the same thing, she is not one of their pts.  I called the pt to get the correct PCP, she gave me Dr. Crist Infante, after calling his office to request a referral they stated she is not one of their pts either.  I called the pt and relayed this message to her.  She said she would call the dr office and call me back.  I havent heard from her yet so Darlena called and left another message on her vm with no response so the pt is aware of the issue and knows if she doesnt have a PCP she will continue to be billed.

## 2015-02-15 ENCOUNTER — Telehealth: Payer: Self-pay | Admitting: *Deleted

## 2015-02-15 ENCOUNTER — Ambulatory Visit
Admission: RE | Admit: 2015-02-15 | Discharge: 2015-02-15 | Disposition: A | Discharge status: Home / Self Care | Payer: 59 | Source: Ambulatory Visit | Attending: Radiation Oncology | Admitting: Radiation Oncology

## 2015-02-15 ENCOUNTER — Ambulatory Visit: Payer: 59 | Admitting: Physical Therapy

## 2015-02-15 ENCOUNTER — Encounter: Payer: Self-pay | Admitting: Radiation Oncology

## 2015-02-15 VITALS — BP 115/67 | HR 96 | Resp 16 | Wt 168.6 lb

## 2015-02-15 DIAGNOSIS — C50911 Malignant neoplasm of unspecified site of right female breast: Secondary | ICD-10-CM

## 2015-02-15 NOTE — Progress Notes (Signed)
Weight and vitals stable. Reports occasional brief sharp pain in her right axilla/right chest wall. Reports she is working two jobs and lifting things a lot; this is mostly when she has those sharp shooting pain. Reports she hasn't seen a GYN in two years. Hyperpigmentation and dry desquamation right chest wall noted. Reports using Biafine bid as directed. Reports mild fatigue.   BP 115/67 mmHg  Pulse 96  Resp 16  Wt 168 lb 9.6 oz (76.476 kg) Wt Readings from Last 3 Encounters:  02/15/15 168 lb 9.6 oz (76.476 kg)  01/30/15 169 lb 6.4 oz (76.839 kg)  11/07/14 164 lb 3.2 oz (74.481 kg)

## 2015-02-15 NOTE — Telephone Encounter (Signed)
CALLED PATIENT TO INFORM OF APPT. FOR PT ON 02-28-15- ARRIVAL TIME - 12:45 PM @ [facility], LVM FOR A RETURN CALL

## 2015-02-15 NOTE — Progress Notes (Signed)
   Department of Radiation Oncology  Phone:  [phone] Fax:        [phone]   Name: Carmen Stone MRN: [id number]  DOB: Nov 13, 1955  Date: 02/15/2015  Follow Up Visit Note  Diagnosis: Inflammatory breast cancer   Staging form: Breast, AJCC 7th Edition     Pathologic stage from 05/17/2014: Stage IIIB (T4d, N1, cM0) - Signed by Thea Silversmith, MD on 05/17/2014  Summary and Interval since last radiation: 50.4 Gy to the right chest wall completed 07/28/14  Interval History: Carmen Stone presents today for routine followup. She reports occasional brief sharp pain in her right axilla/right chest wall. Reports she is working two jobs and lifting things a lot; this is mostly when she has those sharp shooting pain. Reports she hasn't seen a GYN in two years. Reports using Biafine bid as directed and has mild fatigue. The pt reports going to physical therapy at the beginning of her treatment but has not followed up and has not been fit for a sleeve.  Physical Exam:  Filed Vitals:   02/15/15 0821  BP: 115/67  Pulse: 96  Resp: 16  Weight: 168 lb 9.6 oz (76.476 kg)  Alert and oriented x 3. In no distress. Hyperpigmentation over the right chest wall. Some minor right arm swelling, but good ROM. Port-a-cath in place.   IMPRESSION: Carmen Stone is a 59 y.o. female s/p radiation to the right chest wall with risk factors of inflammatory cancer and positive margins.  PLAN:  I advised the pt to apply regular vitamin E lotion to the treatment area instead of continuing with the Biafine. I have filled an order for the pt to attend physical therapy for her right arm pain and to be fitted for a lymphedema sleeve. The pt requested refills for Emla cream and Tramadol which I have referred for her to Dr. Burr Medico.  The pt would like to keep her port-a-cath. I spoke with Dr. Burr Medico and she said it was appropriate to keep the port-a-cath. She will follow up with Dr. Burr Medico as scheduled. We discussed the need for yearly  mammograms for the left breast which she can schedule with her OBGYN or with medical oncology. We discussed the need for sun protection in the treated area. I will follow up with her on an as needed basis and she can always call me with any questions or concerns.  This document serves as a record of services personally performed by Thea Silversmith, MD. It was created on her behalf by Darcus Austin, a trained medical scribe. The creation of this record is based on the scribe's personal observations and the provider's statements to them. This document has been checked and approved by the attending provider.    Thea Silversmith, MD

## 2015-02-16 ENCOUNTER — Encounter: Payer: Self-pay | Admitting: *Deleted

## 2015-02-16 NOTE — Progress Notes (Signed)
[place] Work  Clinical Social Work was referred by patient for assessment of psychosocial needs due to financial concerns.  Clinical Social Worker met with patient at [facility] in [place] office to offer support and assess for needs.  CSW had referred pt to financial counselors to assist as well. Pt recently changed insurances and needs PCP to make referrals for visits to specialists. Pt reports she has chosen Dr Joylene Draft as her PCP, but has yet to get appt scheduled. Pt educated she needs actual appt. Pt stated understanding and will call later today. Pt concerned about medical bills from previous insurance company that other [place] assisted her with. Pt to return on 9/27 for further assistance with issue. Pt also needing help with fin asst for dentures. CSW explained no resources to assist and pt will have to save for this need. Pt aware to meet with CSW on 9/27.    Clinical Social Work interventions: Resource assistance and education  Loren Racer, [place] Worker [place]  [place] Phone: [phone] Fax: [phone]

## 2015-02-28 ENCOUNTER — Ambulatory Visit: Payer: 59 | Attending: Radiation Oncology | Admitting: Physical Therapy

## 2015-03-09 ENCOUNTER — Other Ambulatory Visit: Payer: Self-pay | Admitting: *Deleted

## 2015-03-09 ENCOUNTER — Other Ambulatory Visit: Payer: Self-pay | Admitting: Hematology

## 2015-03-09 DIAGNOSIS — C50911 Malignant neoplasm of unspecified site of right female breast: Secondary | ICD-10-CM

## 2015-03-09 MED ORDER — TRAMADOL HCL 50 MG PO TABS: ORAL_TABLET | ORAL | Status: DC

## 2015-03-09 NOTE — Telephone Encounter (Signed)
Patient called for refill of tramadol. Will refill medication and give to MD Burr Medico to sign.

## 2015-03-13 ENCOUNTER — Ambulatory Visit (HOSPITAL_BASED_OUTPATIENT_CLINIC_OR_DEPARTMENT_OTHER): Payer: 59

## 2015-03-13 DIAGNOSIS — Z452 Encounter for adjustment and management of vascular access device: Secondary | ICD-10-CM | POA: Diagnosis not present

## 2015-03-13 DIAGNOSIS — Z853 Personal history of malignant neoplasm of breast: Secondary | ICD-10-CM | POA: Diagnosis not present

## 2015-03-13 DIAGNOSIS — Z95828 Presence of other vascular implants and grafts: Secondary | ICD-10-CM

## 2015-03-13 MED ORDER — SODIUM CHLORIDE 0.9 % IJ SOLN
10.0000 mL | INTRAMUSCULAR | Status: DC | PRN
  Administered 2015-03-13: 10 mL via INTRAVENOUS
  Filled 2015-03-13: qty 10

## 2015-03-13 MED ORDER — HEPARIN SOD (PORK) LOCK FLUSH 100 UNIT/ML IV SOLN
500.0000 [IU] | Freq: Once | INTRAVENOUS | Status: AC
  Administered 2015-03-13: 500 [IU] via INTRAVENOUS
  Filled 2015-03-13: qty 5

## 2015-03-13 NOTE — Patient Instructions (Signed)

## 2015-03-20 ENCOUNTER — Ambulatory Visit: Payer: 59 | Admitting: Physical Therapy

## 2015-03-27 ENCOUNTER — Encounter: Payer: Self-pay | Admitting: Hematology

## 2015-03-27 NOTE — Progress Notes (Signed)
Pt called Carmen Stone to inquire about funds left in grant. I contacted pt to advise and there was no room on voicemail.

## 2015-03-27 NOTE — Progress Notes (Signed)
The patient left message to see how much grant funds she has left. I emailed [phone]

## 2015-03-29 ENCOUNTER — Encounter: Payer: Self-pay | Admitting: Physical Therapy

## 2015-03-29 ENCOUNTER — Ambulatory Visit: Payer: 59 | Attending: Radiation Oncology | Admitting: Physical Therapy

## 2015-03-29 ENCOUNTER — Encounter: Payer: Self-pay | Admitting: *Deleted

## 2015-03-29 DIAGNOSIS — M25611 Stiffness of right shoulder, not elsewhere classified: Secondary | ICD-10-CM | POA: Diagnosis present

## 2015-03-29 DIAGNOSIS — I972 Postmastectomy lymphedema syndrome: Secondary | ICD-10-CM | POA: Diagnosis present

## 2015-03-29 DIAGNOSIS — R293 Abnormal posture: Secondary | ICD-10-CM | POA: Diagnosis present

## 2015-03-29 NOTE — Therapy (Signed)
[place], [place], [address] Phone: [phone]   Fax:  [phone]  Physical Therapy Evaluation  Patient Details  Name: Carmen Stone MRN: [id number] Date of Birth: 02/18/1956 Referring Provider:  Thea Silversmith, MD  Encounter Date: 03/29/2015      PT End of Session - 03/29/15 0923    Visit Number 1   Number of Visits 24   Date for PT Re-Evaluation 05/24/15   PT Start Time 0848   PT Stop Time 0930   PT Time Calculation (min) 42 min   Activity Tolerance Patient tolerated treatment well   Behavior During Therapy [facility] for tasks assessed/performed      Past Medical History  Diagnosis Date  . Diabetes mellitus, type II ([place])   . Inflammatory breast cancer ([place]) 10/22/13    right  . Radiation 06/20/14-07/28/14    Past Surgical History  Procedure Laterality Date  . Cesarean section    . Ventral hernia repair  2011    repair incacerated VH with biologic mesh; cholecystectomy  . Cholecystectomy  2011  . Incision and drainage of wound Right 10/22/2013    Procedure: IRRIGATION AND DRAINAGE AND DEBRIDEMENT RIGHT BREAST WOUND;  Surgeon: [name], MD;  Location: [place];  Service: General;  Laterality: Right;  . Breast biopsy Right 10/22/2013    Procedure: BREAST BIOPSY;  Surgeon: [name], MD;  Location: [place];  Service: General;  Laterality: Right;  . Irrigation and debridement abscess Right 10/24/2013    Procedure: Right total mastectomy;  Surgeon: [name], MD;  Location: [place];  Service: General;  Laterality: Right;  . Dressing change under anesthesia Right 10/26/2013    Procedure: COMPLETION MASTECTOMY, AXILLARY DISSECTION;  Surgeon: [name], MD;  Location: [place];  Service: General;  Laterality: Right;  . Portacath placement N/A 11/16/2013    Procedure: INSERTION PORT-A-CATH;  Surgeon: [name], MD;  Location: [place];  Service: General;   Laterality: N/A;    There were no vitals filed for this visit.  Visit Diagnosis:  Lymphedema syndrome, postmastectomy - Plan: PT plan of care cert/re-cert  Posture abnormality - Plan: PT plan of care cert/re-cert  Shoulder stiffness, right - Plan: PT plan of care cert/re-cert      Subjective Assessment - 03/29/15 0855    Subjective She reports since completion of radiation, she has right lateral chest swelling and pain in her axilla.   Pertinent History Pt with 3 surgeries in May resulting in right mastectomy with axillary nodes dissection ( 2 of 16 removed postitive) for inflammatory breast cancer. She has undergone chemo and finishied radiation in August per her report.   Patient Stated Goals to get rid of the pain swelling under her right arm   Currently in Pain? Yes   Pain Score 2    Pain Location Axilla   Pain Orientation Right   Pain Descriptors / Indicators Sharp   Pain Type Chronic pain   Pain Onset More than a month ago   Pain Frequency Intermittent   Aggravating Factors  Lifting arm   Pain Relieving Factors Not lifting arm   Multiple Pain Sites No            [facility] PT Assessment - 03/29/15 0001    Assessment   Medical Diagnosis Right arm pain s/p mastectomy   Onset Date/Surgical Date 11/15/14   Hand Dominance Right   Prior Therapy 2/16   Precautions   Precautions Other (  comment)  Hx right breast inflammatory cancer   Restrictions   Weight Bearing Restrictions No   Balance Screen   Has the patient fallen in the past 6 months No   Has the patient had a decrease in activity level because of a fear of falling?  No   Is the patient reluctant to leave their home because of a fear of falling?  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    Cognition   Overall Cognitive Status Within Functional Limits for tasks assessed   Posture/Postural Control   Posture/Postural Control Postural limitations   Postural Limitations Rounded Shoulders;Forward head;Increased thoracic kyphosis   AROM   Right Shoulder Extension 47 Degrees   Right Shoulder Flexion 126 Degrees   Right Shoulder ABduction 138 Degrees   Right Shoulder Internal Rotation 70 Degrees   Right Shoulder External Rotation 77 Degrees   Left Shoulder Extension 67 Degrees   Left Shoulder Flexion 129 Degrees   Left Shoulder ABduction 135 Degrees   Left Shoulder Internal Rotation 58 Degrees   Left Shoulder External Rotation 87 Degrees   Palpation   Palpation comment Palpable and visible swelling present on right lateral chest; decreased skin mobility in radiation area with significant scar tissue present at incision site which may be contributing to poor lymphatic flow           LYMPHEDEMA/ONCOLOGY QUESTIONNAIRE - 03/29/15 0900    Type   Cancer Type Right breast inflammatory    Surgeries   Mastectomy Date 10/24/13   Axillary Lymph Node Dissection Date 10/24/13   Other Surgery Date --  2 other surgeries in May 2015- debridement prior mastectomy   Number Lymph Nodes Removed 16   Treatment   Past Chemotherapy Treatment Yes   Date 05/01/14   Past Radiation Treatment Yes   Date 01/15/15  approximate date per pt report   Body Site right chest and axilla   What other symptoms do you have   Are you Having Heaviness or Tightness Yes   Are you having Pain Yes   Are you having pitting edema No   Is it Hard or Difficult finding clothes that fit No   Do you have infections No   Is there Decreased scar mobility Yes   [place] Sign No   Right Upper Extremity Lymphedema   At Axilla  32.5 cm   15 cm Proximal to Olecranon Process 31.8 cm   10 cm Proximal to Olecranon Process 30.8 cm   Olecranon Process 26.2 cm    15 cm Proximal to Ulnar Styloid Process 27.8 cm   10 cm Proximal to Ulnar Styloid Process 25.3 cm   Just Proximal to Ulnar Styloid Process 18.4 cm   Across Hand at [employer] 21.3 cm   At [place] of 2nd Digit 7 cm   Left Upper Extremity Lymphedema   At Axilla  32 cm   15 cm Proximal to Olecranon Process 30.3 cm   10 cm Proximal to Olecranon Process 30 cm   Olecranon Process 24.9 cm   15 cm Proximal to Ulnar Styloid Process 25 cm   10 cm Proximal to Ulnar Styloid Process 22 cm   Just  Proximal to Ulnar Styloid Process 15.9 cm   Across Hand at [employer] 20.1 cm   At [place] of 2nd Digit 6.5 cm   Other At area around chest, 8 cm distal to axilla at crease, chest circumference measures 105.4 cm with arms resting at sides.  Visible and palpable protrusion of swollen area on right lateral chest.           Katina Dung - 03/29/15 0001    Open a tight or new jar No difficulty   Do heavy household chores (wash walls, wash floors) No difficulty   Carry a shopping bag or briefcase No difficulty   Wash your back Mild difficulty   Use a knife to cut food No difficulty   Recreational activities in which you take some force or impact through your arm, shoulder, or hand (golf, hammering, tennis) No difficulty   During the past week, to what extent has your arm, shoulder or hand problem interfered with your normal social activities with family, friends, neighbors, or groups? Not at all   During the past week, to what extent has your arm, shoulder or hand problem limited your work or other regular daily activities Not at all   Arm, shoulder, or hand pain. None   Tingling (pins and needles) in your arm, shoulder, or hand Mild   Difficulty Sleeping No difficulty   DASH Score 4.55 %            Short Term Clinic Goals - 03/29/15 1012    CC Short Term Goal  #1   Title Patient will verbalize understanding of lymphedema risk reduction practices   Time 4   Period Weeks   Status New   CC  Short Term Goal  #2   Title Patient will reduce right arm at 10 cm proximal to ulnar styloid process by >/= 0.5 cm    Time 4   Period Weeks   Status New   CC Short Term Goal  #3   Title Patient will reduce right arm at 15 cm proximal to ulnar styloid process by >/= 0.5 cm    Time 4   Period Weeks   Status New   CC Short Term Goal  #4   Title Patient will be able to report >/= 25% less pain and fullness in her right lateral trunk / chest.   Time 4   Period Weeks   Status New   CC Short Term Goal  #5   Title pt will increase right shoulder flexion to 140 degrees so that she can perform her job duties easier.   Time 8   Period Weeks   Status New             Long Term Clinic Goals - 03/29/15 1014    CC Long Term Goal  #1   Title Patient will reduce right arm at 10 cm proximal to ulnar styloid process by >/= 1 cm   Time 8   Period Weeks   Status New   CC Long Term Goal  #2   Title Pt will report decrease in pain under right arm by 50% so she can tolerate her daily activities better   Time 8   Period Weeks   Status New   CC Long Term Goal  #3   Title Patient will reduce right arm at 15 cm proximal to ulnar styloid process by >/= 1 cm    Time 8   Period Weeks   Status  New   CC Long Term Goal  #4   Title pt will increase right shoulder flexion to 150 degrees so that she can perform her job duties easier.   Time 8   Period Weeks   Status New   CC Long Term Goal  #5   Title pt will verbalize a strategy for use of compression to help manage her swelling at home.   Time 8   Period Weeks   Status New            Plan - 03/29/15 0109    PT Next Visit Plan Begin manual lymph drainage and work to get her fitted for a compression sleeve and glove; consider night time compression.  Unable ot do bandaging due to work duties.  Myofascial release and scar massage to right chest; PROM and ROM exercises for right shoulder.         Problem List Patient Active Problem List    Diagnosis Date Noted  . Hypersensitivity reaction 02/08/2014  . Drug induced neutropenia(288.03) 01/30/2014  . Nausea with vomiting 11/04/2013  . Inflammatory breast cancer ([place]) 11/02/2013  . ATN (acute tubular necrosis) 11/02/2013  . Acute respiratory failure ([place]) 11/02/2013  . Acute respiratory failure with hypoxia ([place]) 10/25/2013  . Abnormal urinalysis 10/25/2013  . Postoperative anemia due to acute blood loss 10/24/2013  . Breast abscess of female 10/22/2013  . HYPERLIPIDEMIA 07/16/2010  . ATELECTASIS 07/16/2010  . DIABETES MELLITUS, TYPE II 06/24/2010  . 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 HYPOXEMIA 06/24/2010  . SMALL BOWEL OBSTRUCTION, HX OF 06/24/2010    Annia Friendly, PT 03/29/2015 12:23 PM  [place], [place], [address] Phone: [phone]   Fax:  [phone]

## 2015-03-29 NOTE — Progress Notes (Signed)
[place] Work  Clinical Social Work was referred by patient for assessment of psychosocial needs due to needs for assistance with Cancer Care and [employer] applications..  Clinical Social Worker completed forms and submitted per request of patient. Pt denied other concerns currently and was pleased with how her PT had been going. Pt agrees to reach out as other needs arise.    Clinical Social Work interventions: Resource assistance Loren Racer, [place] Worker [place]  [place] Phone: [phone] Fax: [phone]

## 2015-04-17 ENCOUNTER — Ambulatory Visit: Payer: 59 | Attending: Radiation Oncology | Admitting: Physical Therapy

## 2015-04-17 DIAGNOSIS — R293 Abnormal posture: Secondary | ICD-10-CM | POA: Insufficient documentation

## 2015-04-17 DIAGNOSIS — M25611 Stiffness of right shoulder, not elsewhere classified: Secondary | ICD-10-CM | POA: Diagnosis present

## 2015-04-17 DIAGNOSIS — R29898 Other symptoms and signs involving the musculoskeletal system: Secondary | ICD-10-CM | POA: Diagnosis present

## 2015-04-17 DIAGNOSIS — I972 Postmastectomy lymphedema syndrome: Secondary | ICD-10-CM | POA: Diagnosis present

## 2015-04-19 ENCOUNTER — Ambulatory Visit: Payer: 59 | Admitting: Physical Therapy

## 2015-04-24 ENCOUNTER — Ambulatory Visit: Payer: 59 | Admitting: Physical Therapy

## 2015-04-24 ENCOUNTER — Ambulatory Visit (HOSPITAL_BASED_OUTPATIENT_CLINIC_OR_DEPARTMENT_OTHER): Payer: 59

## 2015-04-24 VITALS — BP 124/47 | HR 88 | Temp 98.7°F | Resp 16

## 2015-04-24 DIAGNOSIS — Z95828 Presence of other vascular implants and grafts: Secondary | ICD-10-CM

## 2015-04-24 DIAGNOSIS — Z853 Personal history of malignant neoplasm of breast: Secondary | ICD-10-CM

## 2015-04-24 DIAGNOSIS — I972 Postmastectomy lymphedema syndrome: Secondary | ICD-10-CM

## 2015-04-24 MED ORDER — HEPARIN SOD (PORK) LOCK FLUSH 100 UNIT/ML IV SOLN
500.0000 [IU] | Freq: Once | INTRAVENOUS | Status: AC
  Administered 2015-04-24: 500 [IU] via INTRAVENOUS
  Filled 2015-04-24: qty 5

## 2015-04-24 MED ORDER — SODIUM CHLORIDE 0.9 % IJ SOLN
10.0000 mL | INTRAMUSCULAR | Status: DC | PRN
  Administered 2015-04-24: 10 mL via INTRAVENOUS
  Filled 2015-04-24: qty 10

## 2015-04-24 NOTE — Patient Instructions (Signed)

## 2015-04-24 NOTE — Therapy (Signed)
[place] Stone, [place], [address] Phone: [phone]   Fax:  [phone]  Physical Therapy Treatment  Patient Details  Name: Carmen Stone MRN: [id number] Date of Birth: 08-29-1955 No Data Recorded  Encounter Date: 04/24/2015      PT End of Session - 04/24/15 1709    Visit Number 2   Number of Visits 24   Date for PT Re-Evaluation 05/24/15   PT Start Time 1600   PT Stop Time 1650   PT Time Calculation (min) 50 min   Activity Tolerance Patient tolerated treatment well   Behavior During Therapy [facility] for tasks assessed/performed      Past Medical History  Diagnosis Date  . Diabetes mellitus, type II ([place])   . Inflammatory breast cancer ([place]) 10/22/13    right  . Radiation 06/20/14-07/28/14    Past Surgical History  Procedure Laterality Date  . Cesarean section    . Ventral hernia repair  2011    repair incacerated VH with biologic mesh; cholecystectomy  . Cholecystectomy  2011  . Incision and drainage of wound Right 10/22/2013    Procedure: IRRIGATION AND DRAINAGE AND DEBRIDEMENT RIGHT BREAST WOUND;  Surgeon: [name], MD;  Location: [place];  Service: General;  Laterality: Right;  . Breast biopsy Right 10/22/2013    Procedure: BREAST BIOPSY;  Surgeon: [name], MD;  Location: [place];  Service: General;  Laterality: Right;  . Irrigation and debridement abscess Right 10/24/2013    Procedure: Right total mastectomy;  Surgeon: [name], MD;  Location: [place];  Service: General;  Laterality: Right;  . Dressing change under anesthesia Right 10/26/2013    Procedure: COMPLETION MASTECTOMY, AXILLARY DISSECTION;  Surgeon: [name], MD;  Location: [place];  Service: General;  Laterality: Right;  . Portacath placement N/A 11/16/2013    Procedure: INSERTION PORT-A-CATH;  Surgeon: [name], MD;  Location: [place];  Service: General;  Laterality: N/A;    There were no  vitals filed for this visit.  Visit Diagnosis:  Lymphedema syndrome, postmastectomy      Subjective Assessment - 04/24/15 1604    Subjective Nothing new.  The swelling looks a little different--not much--maybe about the same.   Currently in Pain? No/denies               LYMPHEDEMA/ONCOLOGY QUESTIONNAIRE - 04/24/15 1606    Right Upper Extremity Lymphedema   At Axilla  32.5 cm   15 cm Proximal to Olecranon Process 30.8 cm   10 cm Proximal to Olecranon Process 29.9 cm   Olecranon Process 25.9 cm   15 cm Proximal to Ulnar Styloid Process 27.4 cm   10 cm Proximal to Ulnar Styloid Process 25.2 cm   Just Proximal to Ulnar Styloid Process 18.2 cm   Across Hand at [employer] 19.9 cm   At [place] of 2nd Digit 6.6 cm                  [facility] Adult PT Treatment/Exercise - 04/24/15 0001    Self-Care   Self-Care Other Self-Care Comments   Other Self-Care Comments  Educated patient about compression sleeves and gloves, showing her samples and describing how they fit and what the goal is of using them; also discussed getting a sports bra or other bra with a wide band at her right side inferior to her axilla for compression of the swollen area there.  She did want to work with  [facility] on providing these for her.   Manual Therapy   Manual Therapy Manual Lymphatic Drainage (MLD);Edema management   Edema Management circumference measurements taken.   Manual Lymphatic Drainage (MLD) Briefly explained the technique of manual lymph drainage, then performed the following:  In supine, short neck, left axilla and anterior interaxillary anastomosis, right groin and axillo-inguinal anastomosis, and right UE from fingers to shoulder.  In left sidelying, right axillo-inguinal anastomosis and posterior interaxillary anasomosis right to left.                   Short Term Clinic Goals - 04/24/15 1715    CC Short Term Goal  #1   Title Patient will verbalize understanding of  lymphedema risk reduction practices   Status On-going   CC Short Term Goal  #2   Title Patient will reduce right arm at 10 cm proximal to ulnar styloid process by >/= 0.5 cm    Status Partially Met   CC Short Term Goal  #3   Title Patient will reduce right arm at 15 cm proximal to ulnar styloid process by >/= 0.5 cm    Status Partially [place] - 03/29/15 1014    CC Long Term Goal  #1   Title Patient will reduce right arm at 10 cm proximal to ulnar styloid process by >/= 1 cm   Time 8   Period Weeks   Status New   CC Long Term Goal  #2   Title Pt will report decrease in pain under right arm by 50% so she can tolerate her daily activities better   Time 8   Period Weeks   Status New   CC Long Term Goal  #3   Title Patient will reduce right arm at 15 cm proximal to ulnar styloid process by >/= 1 cm    Time 8   Period Weeks   Status New   CC Long Term Goal  #4   Title pt will increase right shoulder flexion to 150 degrees so that she can perform her job duties easier.   Time 8   Period Weeks   Status New   CC Long Term Goal  #5   Title pt will verbalize a strategy for use of compression to help manage her swelling at home.   Time 8   Period Weeks   Status New            Plan - 04/24/15 1709    Clinical Impression Statement Measurements taken at beginning of session showed significant decreases in circumference measurements compared to patient's evaluation visit three weeks ago.  Patient was attentive for discussion of compresson garments and introduction to manual lymph drainage.  She was comfortable with this treatment.  She asked, based on her boss's request, if we could take a break from the week of Thanksgiving until after Christmas.  I said we might treat her for these next two weeks and then determine whether she might be ready for discharge or if we would continue after Christmas (and if this is a feasible approach).   Pt will  benefit from skilled therapeutic intervention in order to improve on the following deficits Increased edema;Decreased knowledge of precautions;Pain;Impaired UE functional use;Decreased range of motion;Increased fascial restricitons   Rehab Potential Good   PT Frequency 2x / week   PT Duration 8 weeks   PT Treatment/Interventions ADLs/Self Care  Home Management;DME Instruction;Patient/family education;Manual lymph drainage;Manual techniques   PT Next Visit Plan Instruct patient in self-manual lymph drainage.  Discuss nighttime compression garments; if desired, contact [facility] about adding this to the plan.  Myofascial release as needed, ROM.   Recommended Other Services Faxed demographic info to [facility] fitting of sleeve and glove and also bras.   Consulted and Agree with Plan of Care Patient        Problem List Patient Active Problem List   Diagnosis Date Noted  . Hypersensitivity reaction 02/08/2014  . Drug induced neutropenia(288.03) 01/30/2014  . Nausea with vomiting 11/04/2013  . Inflammatory breast cancer ([place]) 11/02/2013  . ATN (acute tubular necrosis) 11/02/2013  . Acute respiratory failure ([place]) 11/02/2013  . Acute respiratory failure with hypoxia ([place]) 10/25/2013  . Abnormal urinalysis 10/25/2013  . Postoperative anemia due to acute blood loss 10/24/2013  . Breast abscess of female 10/22/2013  . HYPERLIPIDEMIA 07/16/2010  . ATELECTASIS 07/16/2010  . DIABETES MELLITUS, TYPE II 06/24/2010  . 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 HYPOXEMIA 06/24/2010  . SMALL BOWEL OBSTRUCTION, HX OF 06/24/2010    Sumeet Geter 04/24/2015, 5:17 PM  [place], [place], [address] Phone: [phone]   Fax:  [phone]  Name: ALUNA WHISTON MRN: [id number] Date of Birth: 02/19/1956    Serafina Royals, PT 04/24/2015 5:17 PM

## 2015-04-26 ENCOUNTER — Ambulatory Visit: Payer: 59 | Admitting: Physical Therapy

## 2015-04-26 DIAGNOSIS — I972 Postmastectomy lymphedema syndrome: Secondary | ICD-10-CM

## 2015-04-26 DIAGNOSIS — R293 Abnormal posture: Secondary | ICD-10-CM

## 2015-04-26 DIAGNOSIS — R29898 Other symptoms and signs involving the musculoskeletal system: Secondary | ICD-10-CM

## 2015-04-26 DIAGNOSIS — M25611 Stiffness of right shoulder, not elsewhere classified: Secondary | ICD-10-CM

## 2015-04-26 NOTE — Therapy (Signed)
[place], [place], [address] Phone: [phone]   Fax:  [phone]  Physical Therapy Treatment  Patient Details  Name: Carmen Stone MRN: [id number] Date of Birth: [birth year] No Data Recorded  Encounter Date: 04/26/2015      PT End of Session - 04/26/15 1800    Visit Number 3   Number of Visits 24   Date for PT Re-Evaluation 05/24/15   PT Start Time 1604   PT Stop Time 1646   PT Time Calculation (min) 42 min   Activity Tolerance Patient tolerated treatment well   Behavior During Therapy [facility] for tasks assessed/performed      Past Medical History  Diagnosis Date  . Diabetes mellitus, type II ([place])   . 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 Radiation 06/20/14-07/28/14    Past Surgical History  Procedure Laterality Date  . Cesarean section    . Ventral hernia repair  2011    repair incacerated VH with biologic mesh; cholecystectomy  . Cholecystectomy  2011  . Incision and drainage of wound Right 10/22/2013    Procedure: IRRIGATION AND DRAINAGE AND DEBRIDEMENT RIGHT BREAST WOUND;  Surgeon: [name], MD;  Location: [place];  Service: General;  Laterality: Right;  . Breast biopsy Right 10/22/2013    Procedure: BREAST BIOPSY;  Surgeon: [name], MD;  Location: [place];  Service: General;  Laterality: Right;  . Irrigation and debridement abscess Right 10/24/2013    Procedure: Right total mastectomy;  Surgeon: [name], MD;  Location: [place];  Service: General;  Laterality: Right;  . Dressing change under anesthesia Right 10/26/2013    Procedure: COMPLETION MASTECTOMY, AXILLARY DISSECTION;  Surgeon: [name], MD;  Location: [place];  Service: General;  Laterality: Right;  . Portacath placement N/A 11/16/2013    Procedure: INSERTION PORT-A-CATH;  Surgeon: [name], MD;  Location: [place];  Service: General;  Laterality: N/A;    There were  no vitals filed for this visit.  Visit Diagnosis:  Lymphedema syndrome, postmastectomy  Posture abnormality  Shoulder stiffness, right  Muscular deconditioning      Subjective Assessment - 04/26/15 1601    Subjective She feels that the last treatment was helpful. She wants to learn how to do the massage.    Pertinent History Pt with 3 surgeries in May 2015 resulting in right mastectomy with axillary nodes dissection ( 2 of 16 removed postitive) for inflammatory breast cancer. She has undergone chemo and finishied radiation in August per her report.   Patient Stated Goals to get rid of the pain swelling under her right arm   Currently in Pain? No/denies            [facility] PT Assessment - 04/26/15 0001    Observation/Other Assessments   Observations pt came in wearing pink post op bandeau around chest, but it had worked down on right lateral chest and had large area of lymphedma above it. Pt states she has been wearing it this way for several months.  Also noted what looked like and old suture coming from mid area of old mastectomy incision.  she was asked to see her MD about this. pt also had protruding abdominal hernia today                      [facility] Adult PT Treatment/Exercise - 04/26/15 0001    Self-Care   Other Self-Care Comments  encouraged pt to wear bandeau up into left axilla to apply compression to lymphedema area at lateral chest and to wear bandeau below this area  Pt acknowledged.    Neck Exercises: Seated   Other Seated Exercise neck range of motion and shoulder rolls   Shoulder Exercises: Supine   Other Supine Exercises dowel rod flexion    Manual Therapy   Manual Lymphatic Drainage (MLD)  In supine, short neck,deep breathing, but avoided abdominal series due to hernia,  left axilla and anterior interaxillary anastomosis, right groin and axillo-inguinal anastomosis, and right UE from fingers to shoulder.  In left sidelying, right axillo-inguinal  anastomosis and posterior interaxillary anasomosis right to left.                   Short Term Clinic Goals - 04/24/15 1715    CC Short Term Goal  #1   Title Patient will verbalize understanding of lymphedema risk reduction practices   Status On-going   CC Short Term Goal  #2   Title Patient will reduce right arm at 10 cm proximal to ulnar styloid process by >/= 0.5 cm    Status Partially Met   CC Short Term Goal  #3   Title Patient will reduce right arm at 15 cm proximal to ulnar styloid process by >/= 0.5 cm    Status Partially [name] - 03/29/15 1014    CC Long Term Goal  #1   Title Patient will reduce right arm at 10 cm proximal to ulnar styloid process by >/= 1 cm   Time 8   Period Weeks   Status New   CC Long Term Goal  #2   Title Pt will report decrease in pain under right arm by 50% so she can tolerate her daily activities better   Time 8   Period Weeks   Status New   CC Long Term Goal  #3   Title Patient will reduce right arm at 15 cm proximal to ulnar styloid process by >/= 1 cm    Time 8   Period Weeks   Status New   CC Long Term Goal  #4   Title pt will increase right shoulder flexion to 150 degrees so that she can perform her job duties easier.   Time 8   Period Weeks   Status New   CC Long Term Goal  #5   Title pt will verbalize a strategy for use of compression to help manage her swelling at home.   Time 8   Period Weeks   Status New            Plan - 04/26/15 1800    Clinical Impression Statement The way pt wears her pink bandeau may be contributing to her lateral chest lymphedma.  Recommend that she get into a prosthesis and  bra soon. Anticipate pt will need extra instruction in self manual lymph drainage and self care    Clinical Impairments Affecting Rehab Potential previous radiation    PT Next Visit Plan Continue nstruct patient in self-manual lymph drainage and use of bandeau for compression at  lateral chest.   Discuss nighttime compression garments; if desired, contact [facility] about adding this to the plan.  Myofascial release as needed,to anterior chestt. ROM and scapular retraction exercise.         Problem List Patient Active Problem List   Diagnosis  Date Noted  . Hypersensitivity reaction 02/08/2014  . Drug induced neutropenia(288.03) 01/30/2014  . Nausea with vomiting 11/04/2013  . 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 ATN (acute tubular necrosis) 11/02/2013  . Acute respiratory failure ([place]) 11/02/2013  . Acute respiratory failure with hypoxia ([place]) 10/25/2013  . Abnormal urinalysis 10/25/2013  . Postoperative anemia due to acute blood loss 10/24/2013  . Breast abscess of female 10/22/2013  . HYPERLIPIDEMIA 07/16/2010  . ATELECTASIS 07/16/2010  . DIABETES MELLITUS, TYPE II 06/24/2010  . 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 HYPOXEMIA 06/24/2010  . SMALL BOWEL OBSTRUCTION, HX OF 06/24/2010   Donato Heinz. Owens Shark, PT  04/26/2015, 6:06 PM  [place], [place], [address] Phone: [phone]   Fax:  [phone]  Name: SATSUKI ZILLMER MRN: [id number] Date of Birth: 30-Mar-1956

## 2015-05-01 ENCOUNTER — Ambulatory Visit: Payer: 59 | Admitting: Physical Therapy

## 2015-05-01 DIAGNOSIS — I972 Postmastectomy lymphedema syndrome: Secondary | ICD-10-CM

## 2015-05-01 NOTE — Patient Instructions (Signed)
Deep Effective Breath   Standing, sitting, or laying down, place both hands on the belly. Take a deep breath IN, expanding the belly; then breath OUT, contracting the belly. Repeat __5__ times. Do __2-3__ sessions per day and before your self massage.  http://gt2.exer.us/866   Copyright  VHI. All rights reserved.  Axilla to Axilla - Sweep   On uninvolved side make 5 circles in the armpit, then pump _5__ times from involved armpit across chest to uninvolved armpit, making a pathway. Do _1__ time per day.  Copyright  VHI. All rights reserved.  Axilla to Inguinal Nodes - Sweep   On involved side, make 5 circles at groin at panty line, then pump _5__ times from armpit along side of trunk to outer hip, making your other pathway. Do __1_ time per day.  Copyright  VHI. All rights reserved.  Arm Posterior: Elbow to Shoulder - Sweep   Pump _5__ times from back of elbow to top of shoulder. Then inner to outer upper arm _5_ times, then outer arm again _5_ times. Then back to the pathways _2-3_ times. Do _1__ time per day.  Copyright  VHI. All rights reserved.  ARM: [place] Wrist to Elbow - Sweep   Pump or stationary circles _5__ times from wrist to elbow making sure to do both sides of the forearm. Then retrace your steps to the outer arm, and the pathways _2-3_ times each. Do _1__ time per day.  Copyright  VHI. All rights reserved.  ARM: Dorsum of Hand to Shoulder - Sweep   Pump or stationary circles _5__ times on back of hand including knuckle spaces and individual fingers if needed working up towards the wrist, then retrace all your steps working back up the forearm, doing both sides; upper outer arm and back to your pathways _2-3_ times each. Then do 5 circles again at uninvolved armpit and involved groin where you started! Good job!! Do __1_ time per day.  Copyright  VHI. All rights reserved.

## 2015-05-01 NOTE — Therapy (Signed)
[place], [place], [address] Phone: [phone]   Fax:  [phone]  Physical Therapy Treatment  Patient Details  Name: Carmen Stone MRN: [id number] Date of Birth: [birth year] No Data Recorded  Encounter Date: 05/01/2015      PT End of Session - 05/01/15 1658    Visit Number 4   Number of Visits 24   Date for PT Re-Evaluation 05/24/15   PT Start Time 1608   PT Stop Time 1652   PT Time Calculation (min) 44 min   Activity Tolerance Patient tolerated treatment well   Behavior During Therapy [facility] for tasks assessed/performed      Past Medical History  Diagnosis Date  . Diabetes mellitus, type II ([place])   . 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 Radiation 06/20/14-07/28/14    Past Surgical History  Procedure Laterality Date  . Cesarean section    . Ventral hernia repair  2011    repair incacerated VH with biologic mesh; cholecystectomy  . Cholecystectomy  2011  . Incision and drainage of wound Right 10/22/2013    Procedure: IRRIGATION AND DRAINAGE AND DEBRIDEMENT RIGHT BREAST WOUND;  Surgeon: [name], MD;  Location: [place];  Service: General;  Laterality: Right;  . Breast biopsy Right 10/22/2013    Procedure: BREAST BIOPSY;  Surgeon: [name], MD;  Location: [place];  Service: General;  Laterality: Right;  . Irrigation and debridement abscess Right 10/24/2013    Procedure: Right total mastectomy;  Surgeon: [name], MD;  Location: [place];  Service: General;  Laterality: Right;  . Dressing change under anesthesia Right 10/26/2013    Procedure: COMPLETION MASTECTOMY, AXILLARY DISSECTION;  Surgeon: [name], MD;  Location: [place];  Service: General;  Laterality: Right;  . Portacath placement N/A 11/16/2013    Procedure: INSERTION PORT-A-CATH;  Surgeon: [name], MD;  Location: [place];  Service: General;  Laterality: N/A;    There were  no vitals filed for this visit.  Visit Diagnosis:  Lymphedema syndrome, postmastectomy      Subjective Assessment - 05/01/15 1611    Subjective "The swelling went down last time I was here.  All of right side swelling seemed better.  No sharp pains or anything during the week."   Currently in Pain? No/denies               LYMPHEDEMA/ONCOLOGY QUESTIONNAIRE - 05/01/15 1642    Right Upper Extremity Lymphedema   At Axilla  33.2 cm   15 cm Proximal to Olecranon Process 31.3 cm   10 cm Proximal to Olecranon Process 30.2 cm   Olecranon Process 26.3 cm   15 cm Proximal to Ulnar Styloid Process 27.6 cm   10 cm Proximal to Ulnar Styloid Process 25.5 cm   Just Proximal to Ulnar Styloid Process 18.4 cm   Across Hand at [employer] 20 cm   At [place] of 2nd Digit 6.5 cm   Left Upper Extremity Lymphedema   Other 105.1                  [facility] Adult PT Treatment/Exercise - 05/01/15 0001    Self-Care   Other Self-Care Comments  Instructed in self-manual lymph drainage as described below.  Assisted patient with donning her compression bandeau properly.   Manual Therapy   Edema Management circumference measurements taken   Manual Lymphatic Drainage (MLD) In supine, deep breathing, short neck, left  axilla and anterior interaxillary anastomosis, right groin and axillo-inguinal anastomosis, and right UE from dorsal hand to shoulder.                PT Education - 05/01/15 1657    Education provided Yes   Education Details self-manual lymph drainage   Person(s) Educated Patient   Methods Explanation;Demonstration;Tactile cues;Verbal cues;Handout   Comprehension Returned demonstration;Need further instruction           Short Term Clinic Goals - 04/24/15 1715    CC Short Term Goal  #1   Title Patient will verbalize understanding of lymphedema risk reduction practices   Status On-going   CC Short Term Goal  #2   Title Patient will reduce right arm at 10 cm proximal  to ulnar styloid process by >/= 0.5 cm    Status Partially Met   CC Short Term Goal  #3   Title Patient will reduce right arm at 15 cm proximal to ulnar styloid process by >/= 0.5 cm    Status Partially [place] - 03/29/15 1014    CC Long Term Goal  #1   Title Patient will reduce right arm at 10 cm proximal to ulnar styloid process by >/= 1 cm   Time 8   Period Weeks   Status New   CC Long Term Goal  #2   Title Pt will report decrease in pain under right arm by 50% so she can tolerate her daily activities better   Time 8   Period Weeks   Status New   CC Long Term Goal  #3   Title Patient will reduce right arm at 15 cm proximal to ulnar styloid process by >/= 1 cm    Time 8   Period Weeks   Status New   CC Long Term Goal  #4   Title pt will increase right shoulder flexion to 150 degrees so that she can perform her job duties easier.   Time 8   Period Weeks   Status New   CC Long Term Goal  #5   Title pt will verbalize a strategy for use of compression to help manage her swelling at home.   Time 8   Period Weeks   Status New            Plan - 05/01/15 1658    Clinical Impression Statement Patient was instructed in self-manual lymph drainage today, but will probably benefit from review, in part because she appears sleepy through much of it.  Circumference measurements today show just slight increases in some areas and slight decreases compared to last two times measured.   Pt will benefit from skilled therapeutic intervention in order to improve on the following deficits Increased edema;Decreased knowledge of precautions;Pain;Impaired UE functional use;Decreased range of motion;Increased fascial restricitons   Rehab Potential Good   Clinical Impairments Affecting Rehab Potential previous radiation    PT Frequency 2x / week   PT Duration 8 weeks   PT Treatment/Interventions ADLs/Self Care Home Management;Patient/family education;Manual  techniques;Manual lymph drainage   PT Next Visit Plan Review self-manual lymph drainage and use of bandeau for compression at lateral chest as needed.   Discuss nighttime compression garments; if desired, contact [facility] about adding this to the plan.  Myofascial release as needed,to anterior chestt. ROM and scapular retraction exercise.    PT Home Exercise Plan Daily self-manual lymph drainage.  Consulted and Agree with Plan of Care Patient        Problem List Patient Active Problem List   Diagnosis Date Noted  . Hypersensitivity reaction 02/08/2014  . Drug induced neutropenia(288.03) 01/30/2014  . Nausea with vomiting 11/04/2013  . 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 ATN (acute tubular necrosis) 11/02/2013  . Acute respiratory failure ([place]) 11/02/2013  . Acute respiratory failure with hypoxia ([place]) 10/25/2013  . Abnormal urinalysis 10/25/2013  . Postoperative anemia due to acute blood loss 10/24/2013  . Breast abscess of female 10/22/2013  . HYPERLIPIDEMIA 07/16/2010  . ATELECTASIS 07/16/2010  . DIABETES MELLITUS, TYPE II 06/24/2010  . 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 HYPOXEMIA 06/24/2010  . SMALL BOWEL OBSTRUCTION, HX OF 06/24/2010    Stone,Carmen 05/01/2015, 5:03 PM  [place], [place], [address] Phone: [phone]   Fax:  [phone]  Name: Carmen Stone MRN: [id number] Date of Birth: 27-Jan-1956    Serafina Royals, PT 05/01/2015 5:03 PM

## 2015-05-03 ENCOUNTER — Ambulatory Visit: Payer: 59 | Admitting: Physical Therapy

## 2015-05-03 DIAGNOSIS — R293 Abnormal posture: Secondary | ICD-10-CM

## 2015-05-03 DIAGNOSIS — M25611 Stiffness of right shoulder, not elsewhere classified: Secondary | ICD-10-CM

## 2015-05-03 DIAGNOSIS — I972 Postmastectomy lymphedema syndrome: Secondary | ICD-10-CM

## 2015-05-03 DIAGNOSIS — R29898 Other symptoms and signs involving the musculoskeletal system: Secondary | ICD-10-CM

## 2015-05-03 NOTE — Therapy (Addendum)
[place], [place], [address] Phone: [phone]   Fax:  [phone]  Physical Therapy Treatment  Patient Details  Name: Carmen Stone MRN: [id number] Date of Birth: [birth year] No Data Recorded  Encounter Date: 05/03/2015      PT End of Session - 05/03/15 1709    Visit Number 5   Number of Visits 24   Date for PT Re-Evaluation 05/24/15   PT Start Time 3838   PT Stop Time 1645   PT Time Calculation (min) 38 min   Activity Tolerance Patient tolerated treatment well   Behavior During Therapy [facility] for tasks assessed/performed      Past Medical History  Diagnosis Date  . Diabetes mellitus, type II ([place])   . 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 Radiation 06/20/14-07/28/14    Past Surgical History  Procedure Laterality Date  . Cesarean section    . Ventral hernia repair  2011    repair incacerated VH with biologic mesh; cholecystectomy  . Cholecystectomy  2011  . Incision and drainage of wound Right 10/22/2013    Procedure: IRRIGATION AND DRAINAGE AND DEBRIDEMENT RIGHT BREAST WOUND;  Surgeon: [name], MD;  Location: [place];  Service: General;  Laterality: Right;  . Breast biopsy Right 10/22/2013    Procedure: BREAST BIOPSY;  Surgeon: [name], MD;  Location: [place];  Service: General;  Laterality: Right;  . Irrigation and debridement abscess Right 10/24/2013    Procedure: Right total mastectomy;  Surgeon: [name], MD;  Location: [place];  Service: General;  Laterality: Right;  . Dressing change under anesthesia Right 10/26/2013    Procedure: COMPLETION MASTECTOMY, AXILLARY DISSECTION;  Surgeon: [name], MD;  Location: [place];  Service: General;  Laterality: Right;  . Portacath placement N/A 11/16/2013    Procedure: INSERTION PORT-A-CATH;  Surgeon: [name], MD;  Location: [place];  Service: General;  Laterality: N/A;    There were  no vitals filed for this visit.  Visit Diagnosis:  Lymphedema syndrome, postmastectomy  Posture abnormality  Shoulder stiffness, right  Muscular deconditioning      Subjective Assessment - 05/03/15 1617    Subjective "even today, its a little bit better"    Pertinent History Pt with 3 surgeries in May 2015 resulting in right mastectomy with axillary nodes dissection ( 2 of 16 removed postitive) for inflammatory breast cancer. She has undergone chemo and finishied radiation in August per her report.   Patient Stated Goals to get rid of the pain swelling under her right arm   Currently in Pain? No/denies            [facility] PT Assessment - 05/03/15 0001    AROM   Right Shoulder Flexion 144 Degrees   Right Shoulder ABduction 140 Degrees   Left Shoulder Flexion 135 Degrees   Left Shoulder ABduction 140 Degrees                     [facility] Adult PT Treatment/Exercise - 05/03/15 0001    Shoulder Exercises: Supine   Horizontal ABduction Strengthening;Both;5 reps;Theraband   Theraband Level (Shoulder Horizontal ABduction) Level 2 (Red)   External Rotation Strengthening;Both;5 reps;Theraband   Theraband Level (Shoulder External Rotation) Level 2 (Red)   Internal Rotation Strengthening;Both;5 reps;Theraband   Theraband Level (Shoulder Internal Rotation) Level 2 (Red)   Flexion Strengthening;Both;5 reps;Theraband   Theraband Level (Shoulder Flexion) Level 2 (Red)  Shoulder Flexion Weight (lbs) wide and narrol grip    Other Supine Exercises diagonal elevation 5 reps, both with red theraband    Shoulder Exercises: Pulleys   Flexion 2 minutes   ABduction 2 minutes                PT Education - 05/03/15 1703    Education provided Yes   Education Details supine scapular series with red theraband    Person(s) Educated Patient   Methods Explanation;Demonstration;Handout   Comprehension Verbalized understanding;Returned demonstration;Need further instruction            Short Term Clinic Goals - 04/24/15 1715    CC Short Term Goal  #1   Title Patient will verbalize understanding of lymphedema risk reduction practices   Status On-going   CC Short Term Goal  #2   Title Patient will reduce right arm at 10 cm proximal to ulnar styloid process by >/= 0.5 cm    Status Partially Met   CC Short Term Goal  #3   Title Patient will reduce right arm at 15 cm proximal to ulnar styloid process by >/= 0.5 cm    Status Partially [place] - 03/29/15 1014    CC Long Term Goal  #1   Title Patient will reduce right arm at 10 cm proximal to ulnar styloid process by >/= 1 cm   Time 8   Period Weeks   Status New   CC Long Term Goal  #2   Title Pt will report decrease in pain under right arm by 50% so she can tolerate her daily activities better   Time 8   Period Weeks   Status New   CC Long Term Goal  #3   Title Patient will reduce right arm at 15 cm proximal to ulnar styloid process by >/= 1 cm    Time 8   Period Weeks   Status New   CC Long Term Goal  #4   Title pt will increase right shoulder flexion to 150 degrees so that she can perform her job duties easier.   Time 8   Period Weeks   Status New   CC Long Term Goal  #5   Title pt will verbalize a strategy for use of compression to help manage her swelling at home.   Time 8   Period Weeks   Status New            Plan - 05/03/15 1709    Clinical Impression Statement Pt reports continual improvement . She did not wear bandeau today and appeared to have less congestion at lateral chest.  Upgraded home program to include supine scapular strengthening. Pt reports she is doing home exercise and will benefit from review    Clinical Impairments Affecting Rehab Potential previous radiation    PT Next Visit Plan Review self-manual lymph drainage and use of bandeau for compression at lateral chest as needed.   Discuss nighttime compression garments; if desired,  contact [facility] about adding this to the plan.  Myofascial release as needed,to anterior chestt. ROM and scapular retraction exercise.         Problem List Patient Active Problem List   Diagnosis Date Noted  . Hypersensitivity reaction 02/08/2014  . Drug induced neutropenia(288.03) 01/30/2014  . Nausea with vomiting 11/04/2013  . 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 ATN (acute tubular necrosis) 11/02/2013  .  Acute respiratory failure ([place]) 11/02/2013  . Acute respiratory failure with hypoxia ([place]) 10/25/2013  . Abnormal urinalysis 10/25/2013  . Postoperative anemia due to acute blood loss 10/24/2013  . Breast abscess of female 10/22/2013  . HYPERLIPIDEMIA 07/16/2010  . ATELECTASIS 07/16/2010  . DIABETES MELLITUS, TYPE II 06/24/2010  . 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 HYPOXEMIA 06/24/2010  . SMALL BOWEL OBSTRUCTION, HX OF 06/24/2010   Donato Heinz. Owens Shark, PT  05/03/2015, 5:12 PM     PHYSICAL THERAPY DISCHARGE SUMMARY  Visits from Start of Care: 5  Current functional level related to goals / functional outcomes: As above   Remaining deficits: As above   Education / Equipment: Lymphedema management, home exercise Plan: Patient agrees to discharge.  Patient goals were partially met. Patient is being discharged due to not returning since the last visit.  ?????           Maudry Diego, PT 06/27/2015 2:01 PM   [place], [place], [address] Phone: [phone]   Fax:  [phone]  Name: Carmen Stone MRN: [id number] Date of Birth: [birth year]

## 2015-05-03 NOTE — Patient Instructions (Signed)
Over Head Pull: Narrow Grip       On back, knees bent, feet flat, band across thighs, elbows straight but relaxed. Pull hands apart (start). Keeping elbows straight, bring arms up and over head, hands toward floor. Keep pull steady on band. Hold momentarily. Return slowly, keeping pull steady, back to start. Repeat _5__ times. Band color __red____   Side Pull: Double Arm   On back, knees bent, feet flat. Arms perpendicular to body, shoulder level, elbows straight but relaxed. Pull arms out to sides, elbows straight. Resistance band comes across collarbones, hands toward floor. Hold momentarily. Slowly return to starting position. Repeat __5_ times. Band color _red____   Elmer Picker   On back, knees bent, feet flat, left hand on left hip, right hand above left. Pull right arm DIAGONALLY (hip to shoulder) across chest. Bring right arm along head toward floor. Hold momentarily. Slowly return to starting position. Repeat _5__ times. Do with left arm. Band color ___red_   Shoulder Rotation: Double Arm   On back, knees bent, feet flat, elbows tucked at sides, bent 90, hands palms up. Pull hands apart and down toward floor, keeping elbows near sides. Hold momentarily. Slowly return to starting position. Repeat _5__ times. Band color __red____

## 2015-05-14 ENCOUNTER — Ambulatory Visit: Payer: 59 | Admitting: Physical Therapy

## 2015-05-16 ENCOUNTER — Ambulatory Visit: Payer: 59 | Admitting: Physical Therapy

## 2015-06-05 ENCOUNTER — Ambulatory Visit (HOSPITAL_BASED_OUTPATIENT_CLINIC_OR_DEPARTMENT_OTHER): Payer: 59

## 2015-06-05 ENCOUNTER — Telehealth: Payer: Self-pay | Admitting: Hematology

## 2015-06-05 ENCOUNTER — Other Ambulatory Visit (HOSPITAL_BASED_OUTPATIENT_CLINIC_OR_DEPARTMENT_OTHER): Payer: 59

## 2015-06-05 ENCOUNTER — Ambulatory Visit (HOSPITAL_BASED_OUTPATIENT_CLINIC_OR_DEPARTMENT_OTHER): Payer: 59 | Admitting: Hematology

## 2015-06-05 ENCOUNTER — Encounter: Payer: Self-pay | Admitting: Hematology

## 2015-06-05 VITALS — BP 127/50 | HR 87 | Temp 98.1°F | Resp 18 | Ht 64.0 in | Wt 167.8 lb

## 2015-06-05 DIAGNOSIS — C50911 Malignant neoplasm of unspecified site of right female breast: Secondary | ICD-10-CM

## 2015-06-05 LAB — COMPREHENSIVE METABOLIC PANEL
ALT: 12 U/L (ref 0–55)
AST: 14 U/L (ref 5–34)
Albumin: 3.6 g/dL (ref 3.5–5.0)
Alkaline Phosphatase: 122 U/L (ref 40–150)
Anion Gap: 7 mEq/L (ref 3–11)
BUN: 10.6 mg/dL (ref 7.0–26.0)
CO2: 25 meq/L (ref 22–29)
CREATININE: 0.8 mg/dL (ref 0.6–1.1)
Calcium: 8.9 mg/dL (ref 8.4–10.4)
Chloride: 104 mEq/L (ref 98–109)
EGFR: >90 mL/min/{1.73_m2} (ref >90)
Glucose: 247 mg/dl — ABNORMAL HIGH (ref 70–140)
Potassium: 4 mEq/L (ref 3.5–5.1)
SODIUM: 136 meq/L (ref 136–145)
TOTAL PROTEIN: 6.8 g/dL (ref 6.4–8.3)

## 2015-06-05 LAB — CBC WITH DIFFERENTIAL/PLATELET
BASO%: 1.2 % (ref 0.0–2.0)
Basophils Absolute: 0.1 10*3/uL (ref 0.0–0.1)
EOS%: 4 % (ref 0.0–7.0)
Eosinophils Absolute: 0.3 10*3/uL (ref 0.0–0.5)
HCT: 36.3 % (ref 34.8–46.6)
HGB: 12 g/dL (ref 11.6–15.9)
LYMPH%: 19.5 % (ref 14.0–49.7)
MCH: 29.9 pg (ref 25.1–34.0)
MCHC: 33 g/dL (ref 31.5–36.0)
MCV: 90.7 fL (ref 79.5–101.0)
MONO#: 0.6 10*3/uL (ref 0.1–0.9)
MONO%: 7.3 % (ref 0.0–14.0)
NEUT%: 68 % (ref 38.4–76.8)
NEUTROS ABS: 5.8 10*3/uL (ref 1.5–6.5)
Platelets: 257 10*3/uL (ref 145–400)
RBC: 4 10*6/uL (ref 3.70–5.45)
RDW: 13.5 % (ref 11.2–14.5)
WBC: 8.6 10*3/uL (ref 3.9–10.3)
lymph#: 1.7 10*3/uL (ref 0.9–3.3)

## 2015-06-05 MED ORDER — TRAMADOL HCL 50 MG PO TABS: ORAL_TABLET | ORAL | Status: DC

## 2015-06-05 MED ORDER — SODIUM CHLORIDE 0.9 % IJ SOLN
10.0000 mL | INTRAMUSCULAR | Status: DC | PRN
  Administered 2015-06-05: 10 mL via INTRAVENOUS
  Filled 2015-06-05: qty 10

## 2015-06-05 MED ORDER — HEPARIN SOD (PORK) LOCK FLUSH 100 UNIT/ML IV SOLN
500.0000 [IU] | Freq: Once | INTRAVENOUS | Status: AC
  Administered 2015-06-05: 500 [IU] via INTRAVENOUS
  Filled 2015-06-05: qty 5

## 2015-06-05 NOTE — Telephone Encounter (Signed)
Scheduled appt with patient while she was here in person at [place] office.       [facility].

## 2015-06-05 NOTE — Patient Instructions (Signed)

## 2015-06-05 NOTE — Progress Notes (Signed)
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    10/24/2013 Surgery Right total mastectomy of necrotizing breast tissue. Path revealed high grade carcinoma which extends into skin with involvement of dermal lymphatics.    10/25/2013 Imaging CT C/A/P negative for distant metastases.   10/26/2013 Surgery Completion right mastectomy & right axillary LN dissection: Surgical path showed high grade IDC, extending into skin. (+) LVI.  2/16 nodes positive for metastatic carcinoma. Due to prior surgery, tumor size difficult to measure, but at least T3.   11/03/2013 Echocardiogram Pre-chemo EF normal: 55-60%.    11/28/2013 - 01/09/2014 Adjuvant Chemotherapy Dose-dense AC x 4 completed.    01/23/2014 - 05/01/2014 Adjuvant Chemotherapy Weekly Carbo/Taxol x 12. [Cycle #2 had mild reaction to Taxol that resolved with treatment; Cycle #7 held d/t neutropenia, but received 1 week later].  Completed all 12 cycles.    06/20/2014 - 07/28/2014 Radiation Therapy Right chest wall and IM lymph nodes: total dose of 50.4 Gy over 28 fractions.  Right supraclavicular fossa and right axilla: total dose of 45 Gy over 25 fractions.    07/31/2014 Imaging Restaging CT c/a/p negative for distant mets.  Also bone scan negative for skeletal mets.    CURRENT THERAPY: Observation  INTERVAL HISTORY: She returns for follow up. She is doing well overall, she has been doing  physical therapy in the lymphedema clinic, with some improvement. She still has mild intermittent chest pain at the surgical site, and occasional abdominal pain from the hernia. She otherwise feels well, denies any other new symptoms. She has good energy level and appetite. Weight is stable.  REVIEW OF SYSTEMS:   Constitutional: Denies fevers, chills or abnormal weight loss Eyes: Denies blurriness of vision Ears, nose, mouth, throat, and face: Denies mucositis or sore throat Respiratory: Denies cough, dyspnea or wheezes Cardiovascular: Denies palpitation, chest discomfort or lower extremity swelling Gastrointestinal:  Denies nausea, heartburn or change in bowel habits Skin: Denies abnormal skin rashes Lymphatics: Denies new lymphadenopathy or easy bruising Neurological:Denies numbness, tingling or new weaknesses Behavioral/Psych: Mood is stable, no new changes  All other systems were reviewed with the patient and are negative.  MEDICAL HISTORY:  Past Medical History  Diagnosis Date  . Diabetes mellitus, type II ([place])   . Inflammatory breast cancer ([employer]) 10/22/13    right  . Radiation 06/20/14-07/28/14    SURGICAL HISTORY: Past Surgical History  Procedure Laterality Date  . Cesarean section    . Ventral hernia repair  2011    repair incacerated VH with biologic mesh; cholecystectomy  . Cholecystectomy  2011  . Incision and drainage of wound Right 10/22/2013    Procedure: IRRIGATION AND DRAINAGE AND DEBRIDEMENT RIGHT BREAST WOUND;  Surgeon: [name], MD;  Location: [place];  Service: General;  Laterality: Right;  . Breast biopsy Right 10/22/2013    Procedure: BREAST BIOPSY;  Surgeon: [name], MD;  Location: [place];  Service: General;  Laterality: Right;  . Irrigation and debridement abscess Right 10/24/2013  Procedure: Right total mastectomy;  Surgeon: Carmen Bookbinder, MD;  Location: [place];  Service: General;  Laterality: Right;  . Dressing change under anesthesia Right 10/26/2013     Procedure: COMPLETION MASTECTOMY, AXILLARY DISSECTION;  Surgeon: Carmen Bookbinder, MD;  Location: [place];  Service: General;  Laterality: Right;  . Portacath placement N/A 11/16/2013    Procedure: INSERTION PORT-A-CATH;  Surgeon: Carmen Bookbinder, MD;  Location: [place];  Service: General;  Laterality: N/A;    I have reviewed the social history and family history with the patient and they are unchanged from previous Stone.  ALLERGIES:  has No Known Allergies.  MEDICATIONS:  Current Outpatient Prescriptions  Medication Sig Dispense Refill  . lidocaine-prilocaine (EMLA) cream Apply 1 application topically as needed. 30 g 0  . acetaminophen (TYLENOL) 325 MG tablet Take 650 mg by mouth every 6 (six) hours as needed for mild pain.    Marland Kitchen emollient (BIAFINE) cream Apply 90 g topically 2 (two) times daily.    Marland Kitchen glucose blood test strip Use as instructed 100 each 12  . glucose monitoring kit (FREESTYLE) monitoring kit 1 each by Does not apply route as needed for other. Check blood sugars daily. 1 each 0  . Insulin Glargine (LANTUS SOLOSTAR) 100 UNIT/ML Solostar Pen Inject 10 Units into the skin daily at 10 pm. 15 mL 3  . lisinopril (PRINIVIL,ZESTRIL) 10 MG tablet   0  . loperamide (IMODIUM) 2 MG capsule Take 2 mg by mouth as needed for diarrhea or loose stools.    Marland Kitchen LORazepam (ATIVAN) 0.5 MG tablet Take 1 tablet (0.5 mg total) by mouth at bedtime as needed (Nausea or vomiting). (Patient not taking: Reported on 01/30/2015) 30 tablet 0  . ondansetron (ZOFRAN) 8 MG tablet Take 1 tablet (8 mg total) by mouth every 8 (eight) hours as needed for nausea or vomiting. (Patient not taking: Reported on 01/30/2015) 20 tablet 3  . pantoprazole (PROTONIX) 40 MG tablet   0  . pravastatin (PRAVACHOL) 10 MG tablet   0  . traMADol (ULTRAM) 50 MG tablet take 1 tablet by mouth every 6 hours if needed 30 tablet 0   No current facility-administered medications for this visit.   Facility-Administered  Medications Ordered in Other Visits  Medication Dose Route Frequency Provider Last Rate Last Dose  . sodium chloride 0.9 % injection 10 mL  10 mL Intravenous PRN Chauncey Cruel, MD   10 mL at 06/05/15 1237    PHYSICAL EXAMINATION: ECOG PERFORMANCE STATUS: 0 - Asymptomatic  Filed Vitals:   06/05/15 1255  BP: 127/50  Pulse: 87  Temp: 98.1 F (36.7 C)  Resp: 18   Filed Weights   06/05/15 1255  Weight: 167 lb 12.8 oz (76.114 kg)    GENERAL:alert, no distress and comfortable SKIN: skin color, texture, turgor are normal, no rashes.  EYES: normal, Conjunctiva are pink and non-injected, sclera clear OROPHARYNX:no exudate, no erythema and lips, buccal mucosa, and tongue normal  NECK: supple, thyroid normal size, non-tender, without nodularity LYMPH:  no palpable lymphadenopathy in the cervical, axillary or inguinal LUNGS: clear to auscultation and percussion with normal breathing effort HEART: regular rate & rhythm and no murmurs and no lower extremity edema ABDOMEN:abdomen soft, non-tender and normal bowel sounds Musculoskeletal:no cyanosis of digits and no clubbing  NEURO: alert & oriented x 3 with fluent speech, no focal motor/sensory deficits Breast: Right breast surgically absent, (+) significant skin pigmentation at the right front chest radiation area, skin intact. The previous  large soft tissue fluid collection on the lateral chest wall has much improved, Lleft breast exam showed no palpable mass, no skin change or nipple discharge. No palpable nodes on bilateral axilla.  LABORATORY DATA:  I have reviewed the data as listed CBC Latest Ref Rng 06/05/2015 01/30/2015 11/07/2014  WBC 3.9 - 10.3 10e3/uL 8.6 7.2 7.4  Hemoglobin 11.6 - 15.9 g/dL 12.0 12.4 11.4(L)  Hematocrit 34.8 - 46.6 % 36.3 36.1 34.0(L)  Platelets 145 - 400 10e3/uL 257 244 242     CMP Latest Ref Rng 06/05/2015 01/30/2015 11/07/2014  Glucose 70 - 140 mg/dl 247(H) 268(H) 182(H)  BUN 7.0 - 26.0 mg/dL 10.6 12.8  17.0  Creatinine 0.6 - 1.1 mg/dL 0.8 0.9 0.8  Sodium 136 - 145 mEq/L 136 139 140  Potassium 3.5 - 5.1 mEq/L 4.0 3.8 3.8  Chloride 96 - 112 mEq/L - - -  CO2 22 - 29 mEq/L _0 Calcium 8.4 - 10.4 mg/dL 8.9 9.2 8.5  Total Protein 6.4 - 8.3 g/dL 6.8 7.1 6.7  Total Bilirubin 0.20 - 1.20 mg/dL <0.30 <0.20 <0.20  Alkaline Phos 40 - 150 U/L 122 134 91  AST 5 - 34 U/L _1 ALT 0 - 55 U/L _2 CA27.29 is still pending   CA 27.29  Status: Finalresult Visible to patient:  Not Released Nextappt: None Dx:  Inflammatory breast cancer, right ([place])           Ref Range 49moago  728mogo     CA 27.29 0 - 39 U/mL 12 7          RADIOGRAPHIC STUDIES: I have personally reviewed the radiological images as listed and agreed with the findings in the report.  Diagnostic mammogram and ultrasound 01/02/2015 IMPRESSION: Benign cyst in dilated retroareolar ducts in the left breast. Small calcified fibroadenoma. No evidence of malignancy.   ASSESSMENT & PLAN: [age]ear old female with past medical history of diabetes, presented with a right inflammatory breast cancer, status post right modified radical mastectomy 10/26/2013 for a cT4, pT3 pN1, stage IIIB invasive ductal carcinoma, grade 3, triple negative.  1. 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 -We discussed the high risk for cancer recurrence, especially in the first 2-3 years. I recommend closer follow-up every 3-4 months for the first 2 years, then every 6-12 months for additional 3 years.  -there is no role for adjuvant endocrine therapy. -lab results reviewed with her, CBC and CMP unremarkable except hyperglycemia -We'll continue annual screening mammogram of the left breast. She is due in July 2017 -I encouraged her to continue healthy diet and exercise regularly.   2. Right chest wall lymphedema -Much improved after physical  therapy  3. Diabetes, type 2 -not well controlled She will follow-up with her primary physician  4. Right chest wall pain -I refilled her tramadol.  Follow-up: I'll see her back in 4 months with lab.   All questions were answered. The patient knows to call the clinic with any problems, questions or concerns. No barriers to learning was detected.  I spent 20 minutes counseling the patient face to face. The total time spent in the appointment was 25 minutes and more than 50% was on counseling and review of test results     FeTruitt MerleMD 06/05/2015 1:17 PM

## 2015-06-19 ENCOUNTER — Ambulatory Visit: Payer: BLUE CROSS/BLUE SHIELD | Admitting: Physical Therapy

## 2015-07-02 ENCOUNTER — Ambulatory Visit: Payer: BLUE CROSS/BLUE SHIELD | Attending: Radiation Oncology

## 2015-07-02 DIAGNOSIS — R293 Abnormal posture: Secondary | ICD-10-CM | POA: Diagnosis present

## 2015-07-02 DIAGNOSIS — I972 Postmastectomy lymphedema syndrome: Secondary | ICD-10-CM

## 2015-07-02 DIAGNOSIS — R29898 Other symptoms and signs involving the musculoskeletal system: Secondary | ICD-10-CM | POA: Insufficient documentation

## 2015-07-02 DIAGNOSIS — M25611 Stiffness of right shoulder, not elsewhere classified: Secondary | ICD-10-CM

## 2015-07-02 NOTE — Therapy (Signed)
[place], [place], [address] Phone: [phone]   Fax:  [phone]  Physical Therapy Treatment  Patient Details  Name: Carmen Stone MRN: [id number] Date of Birth: 21-Feb-1956 No Data Recorded  Encounter Date: 07/02/2015      PT End of Session - 07/02/15 1223    Visit Number 6   Number of Visits 24   Date for PT Re-Evaluation 05/24/15   PT Start Time 1020   PT Stop Time 1106   PT Time Calculation (min) 46 min   Activity Tolerance Patient tolerated treatment well   Behavior During Therapy [facility] for tasks assessed/performed      Past Medical History  Diagnosis Date  . Diabetes mellitus, type II ([place])   . Inflammatory breast cancer ([place]) 10/22/13    right  . Radiation 06/20/14-07/28/14    Past Surgical History  Procedure Laterality Date  . Cesarean section    . Ventral hernia repair  2011    repair incacerated VH with biologic mesh; cholecystectomy  . Cholecystectomy  2011  . Incision and drainage of wound Right 10/22/2013    Procedure: IRRIGATION AND DRAINAGE AND DEBRIDEMENT RIGHT BREAST WOUND;  Surgeon: [name], MD;  Location: [place];  Service: General;  Laterality: Right;  . Breast biopsy Right 10/22/2013    Procedure: BREAST BIOPSY;  Surgeon: [name], MD;  Location: [facility];  Service: General;  Laterality: Right;  . Irrigation and debridement abscess Right 10/24/2013    Procedure: Right total mastectomy;  Surgeon: [name], MD;  Location: [place];  Service: General;  Laterality: Right;  . Dressing change under anesthesia Right 10/26/2013    Procedure: COMPLETION MASTECTOMY, AXILLARY DISSECTION;  Surgeon: [name], MD;  Location: [facility];  Service: General;  Laterality: Right;  . Portacath placement N/A 11/16/2013    Procedure: INSERTION PORT-A-CATH;  Surgeon: [name], MD;  Location: [facility];  Service: General;  Laterality: N/A;    There were no  vitals filed for this visit.  Visit Diagnosis:  Lymphedema syndrome, postmastectomy  Posture abnormality  Shoulder stiffness, right  Muscular deconditioning      Subjective Assessment - 07/02/15 1038    Subjective Been doing really well, just missed my last 2 visits in Novemeber so wanted to come back in 1 more time for a check up. Doing my HEP and wear the bandeau almost every day.    Currently in Pain? No/denies            [facility] PT Assessment - 07/02/15 0001    AROM   Right Shoulder Flexion 151 Degrees   Right Shoulder ABduction 166 Degrees   Left Shoulder Flexion 143 Degrees   Left Shoulder ABduction 168 Degrees           LYMPHEDEMA/ONCOLOGY QUESTIONNAIRE - 07/02/15 1039    Right Upper Extremity Lymphedema   At Axilla  32.7 cm   15 cm Proximal to Olecranon Process 31.6 cm   10 cm Proximal to Olecranon Process 30.2 cm   Olecranon Process 25.8 cm   15 cm Proximal to Ulnar Styloid Process 27.4 cm   10 cm Proximal to Ulnar Styloid Process 25.1 cm   Just Proximal to Ulnar Styloid Process 17.6 cm   Across Hand at [employer] 20.3 cm   At [place] of 2nd Digit 6.8 cm   Left Upper Extremity Lymphedema   Other 105.6  [place] Adult PT Treatment/Exercise - 07/02/15 0001    Self-Care   Other Self-Care Comments  Instructed pt in lymphedema risk reduction practices and issued handout for this answering pts questions throughout. Also reissued phone number for [facility] for pt to go get measured, she was hoping to do this today.   Shoulder Exercises: ROM/Strengthening   Other ROM/Strengthening Exercises Roll yellow ball up wall 5 reps for flexion   Other ROM/Strengthening Exercises Standing 3 way raises with 1 lb 10 reps each with tactile cuing and demo for technique (flexion, scaption, and abduction to 90 degrees)                PT Education - 07/02/15 1222    Education provided Yes   Education Details Get ball to roll up wall to  work on end ROM stretching. Standing exercises pt could perform with light weights (1-2 lbs). Also instructed pt in lymphedema risk reduction practices and issued handout for this as well.    Person(s) Educated Patient   Methods Explanation;Demonstration;Handout;Verbal cues   Comprehension Verbalized understanding;Returned demonstration           Short Term Clinic Goals - 07/02/15 1027    CC Short Term Goal  #1   Title Patient will verbalize understanding of lymphedema risk reduction practices   Status Achieved             Long Term Clinic Goals - 07/02/15 1036    CC Long Term Goal  #1   Title Patient will reduce right arm at 10 cm proximal to ulnar styloid process by >/= 1 cm  Pt reduced 0.2 cm   Status Not Met   CC Long Term Goal  #2   Title Pt will report decrease in pain under right arm by 50% so she can tolerate her daily activities better  80% improvement with this   Status Achieved   CC Long Term Goal  #3   Title Patient will reduce right arm at 15 cm proximal to ulnar styloid process by >/= 1 cm   Pt reduced 0.4 cm   Status Not Met   CC Long Term Goal  #4   Title pt will increase right shoulder flexion to 150 degrees so that she can perform her job duties easier.  151 degrees attained today 07/02/15   Status Achieved   CC Long Term Goal  #5   Title pt will verbalize a strategy for use of compression to help manage her swelling at home.  Reissued phone number for [facility] and pt still wearing bandeau to trunk daily   Status Achieved            Plan - 07/02/15 1223    Clinical Impression Statement Pt came in reporting that she feels she has been doing very well overall with her Rt shoulder ROM and her measurments today were improved as well. She feels ready for discharge and just wanted to come in one more time to make sure her measurements were good and to learn some more HEP with weights. She has been compliant with her HEP and wearing bandeau at  chest. Plans to go get mesured for a compressio nbra today, issued their phone number to here.    Pt will benefit from skilled therapeutic intervention in order to improve on the following deficits Increased edema;Decreased knowledge of precautions;Pain;Impaired UE functional use;Decreased range of motion;Increased fascial restricitons   Rehab Potential Good   Clinical Impairments Affecting Rehab Potential  previous radiation    PT Frequency 2x / week   PT Duration 8 weeks   PT Treatment/Interventions ADLs/Self Care Home Management;Patient/family education;Manual techniques;Manual lymph drainage   PT Next Visit Plan Discharge visit.    PT Home Exercise Plan Cont daily self-manual lymph drainage, and cont HEP exercises including what was issued to her today.    Consulted and Agree with Plan of Care Patient        Problem List Patient Active Problem List   Diagnosis Date Noted  . Hypersensitivity reaction 02/08/2014  . Drug induced neutropenia(288.03) 01/30/2014  . Nausea with vomiting 11/04/2013  . Inflammatory breast cancer ([place]) 11/02/2013  . ATN (acute tubular necrosis) 11/02/2013  . Acute respiratory failure ([place]) 11/02/2013  . Acute respiratory failure with hypoxia ([place]) 10/25/2013  . Abnormal urinalysis 10/25/2013  . Postoperative anemia due to acute blood loss 10/24/2013  . Breast abscess of female 10/22/2013  . HYPERLIPIDEMIA 07/16/2010  . ATELECTASIS 07/16/2010  . DIABETES MELLITUS, TYPE II 06/24/2010  . 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 HYPOXEMIA 06/24/2010  . SMALL BOWEL OBSTRUCTION, HX OF 06/24/2010    Otelia Limes, PTA 07/02/2015, 12:34 PM  [place], [place], [address] Phone: [phone]   Fax:  [phone]  Name: YOLETTE HASTINGS MRN: [id number] Date of Birth: 1956/06/11  PHYSICAL THERAPY DISCHARGE SUMMARY  Visits from Start of Care: 6  Current functional level  related to goals / functional outcomes: All goals met except those related to circumferential measurements.  She is pleased with her progress and requests discharge at this time.   Remaining deficits: Swelling continues to be present in right upper extremity.   Education / Equipment: Compression bra  Plan: Patient agrees to discharge.  Patient goals were partially met. Patient is being discharged due to being pleased with the current functional level.  ?????       Annia Friendly, [place] 07/04/2015 9:32 AM

## 2015-07-02 NOTE — Patient Instructions (Signed)
1. Roll ball up wall keeping shoulders down until gentle stretch is felt under arms, then lean forward towards wall slightly to increase stretch. Should feel tolerable, NOT painful.  Hold each for 5 seconds and do 5-10 reps  2. Standing 3 ways raises lifting 1-2 pounds in front,  In a "V", then all the way out to side in a "T". All 10 reps each, 1-2 sets.   3. Bicep  10-20 reps  4. Tricep extension leaning on chair or counter top keeping elbow up behind you. 10 reps each arm, 1-2 sets    Cancer Rehab [phone]

## 2015-07-11 ENCOUNTER — Encounter: Payer: Self-pay | Admitting: *Deleted

## 2015-07-11 NOTE — Progress Notes (Signed)
[place] Work  Clinical Social Work was referred by patient for assistance with ADRs.  Clinical Social Worker mailed packet to pt with ADR packet. CSW team available to complete ADRs with pt at her next appt if she desires.     Clinical Social Work interventions: ADR Education and assistance  Loren Racer, [place] Worker [place]  [place] Phone: [phone] Fax: [phone]

## 2015-07-17 ENCOUNTER — Telehealth: Payer: Self-pay | Admitting: Hematology

## 2015-07-17 ENCOUNTER — Ambulatory Visit (HOSPITAL_BASED_OUTPATIENT_CLINIC_OR_DEPARTMENT_OTHER): Payer: BLUE CROSS/BLUE SHIELD

## 2015-07-17 DIAGNOSIS — C50911 Malignant neoplasm of unspecified site of right female breast: Secondary | ICD-10-CM | POA: Diagnosis not present

## 2015-07-17 DIAGNOSIS — Z452 Encounter for adjustment and management of vascular access device: Secondary | ICD-10-CM

## 2015-07-17 DIAGNOSIS — Z95828 Presence of other vascular implants and grafts: Secondary | ICD-10-CM

## 2015-07-17 MED ORDER — SODIUM CHLORIDE 0.9% FLUSH
10.0000 mL | INTRAVENOUS | Status: DC | PRN
  Administered 2015-07-17: 10 mL via INTRAVENOUS
  Filled 2015-07-17: qty 10

## 2015-07-17 MED ORDER — HEPARIN SOD (PORK) LOCK FLUSH 100 UNIT/ML IV SOLN
500.0000 [IU] | Freq: Once | INTRAVENOUS | Status: AC
  Administered 2015-07-17: 500 [IU] via INTRAVENOUS
  Filled 2015-07-17: qty 5

## 2015-07-17 NOTE — Telephone Encounter (Signed)
Added flush appointments per patient request and printed a new avs   Carmen Stone

## 2015-07-17 NOTE — Patient Instructions (Signed)

## 2015-08-20 ENCOUNTER — Telehealth: Payer: Self-pay | Admitting: *Deleted

## 2015-08-20 DIAGNOSIS — C50911 Malignant neoplasm of unspecified site of right female breast: Secondary | ICD-10-CM

## 2015-08-20 MED ORDER — TRAMADOL HCL 50 MG PO TABS: ORAL_TABLET | ORAL | Status: DC

## 2015-08-20 NOTE — Telephone Encounter (Signed)
Received call from pt stating that she needs a refill on her tramadol.  OK for refill per Dr Burr Medico & # [phone].  Pt notified that pharmacy called.

## 2015-08-28 ENCOUNTER — Telehealth: Payer: Self-pay | Admitting: Hematology

## 2015-08-28 NOTE — Telephone Encounter (Signed)
returned call and lvm for pt to call back to r/s missed appt

## 2015-09-06 ENCOUNTER — Telehealth: Payer: Self-pay | Admitting: Hematology

## 2015-09-06 NOTE — Telephone Encounter (Signed)
returned call and s.w pt and r/s cx appt...pt ok and aware of new d.t

## 2015-09-14 ENCOUNTER — Other Ambulatory Visit: Payer: Self-pay | Admitting: *Deleted

## 2015-09-14 ENCOUNTER — Encounter: Payer: Self-pay | Admitting: Adult Health

## 2015-09-14 ENCOUNTER — Ambulatory Visit (HOSPITAL_BASED_OUTPATIENT_CLINIC_OR_DEPARTMENT_OTHER): Payer: BLUE CROSS/BLUE SHIELD

## 2015-09-14 VITALS — BP 133/73 | HR 102 | Temp 98.5°F | Resp 18

## 2015-09-14 DIAGNOSIS — Z95828 Presence of other vascular implants and grafts: Secondary | ICD-10-CM

## 2015-09-14 DIAGNOSIS — Z452 Encounter for adjustment and management of vascular access device: Secondary | ICD-10-CM | POA: Diagnosis not present

## 2015-09-14 DIAGNOSIS — C50911 Malignant neoplasm of unspecified site of right female breast: Secondary | ICD-10-CM | POA: Diagnosis not present

## 2015-09-14 MED ORDER — SODIUM CHLORIDE 0.9% FLUSH
10.0000 mL | INTRAVENOUS | Status: DC | PRN
  Administered 2015-09-14: 10 mL via INTRAVENOUS
  Filled 2015-09-14: qty 10

## 2015-09-14 MED ORDER — TRAMADOL HCL 50 MG PO TABS: ORAL_TABLET | ORAL | Status: DC

## 2015-09-14 MED ORDER — HEPARIN SOD (PORK) LOCK FLUSH 100 UNIT/ML IV SOLN
500.0000 [IU] | Freq: Once | INTRAVENOUS | Status: AC
  Administered 2015-09-14: 500 [IU] via INTRAVENOUS
  Filled 2015-09-14: qty 5

## 2015-09-14 NOTE — Patient Instructions (Signed)

## 2015-09-14 NOTE — Progress Notes (Signed)
Pt in for port flush, requested tramadol refill. Called and spoke with Myrtle,RN. rx sent to pharmacy.

## 2015-09-14 NOTE — Progress Notes (Signed)
A birthday card was mailed to the patient today on behalf of the Survivorship Program at [facility].   Amariona Rathje, NP Survivorship Program [facility] [phone]  

## 2015-10-04 ENCOUNTER — Encounter: Payer: Self-pay | Admitting: Hematology

## 2015-10-04 ENCOUNTER — Ambulatory Visit (HOSPITAL_BASED_OUTPATIENT_CLINIC_OR_DEPARTMENT_OTHER): Payer: BLUE CROSS/BLUE SHIELD | Admitting: Hematology

## 2015-10-04 ENCOUNTER — Other Ambulatory Visit (HOSPITAL_BASED_OUTPATIENT_CLINIC_OR_DEPARTMENT_OTHER): Payer: BLUE CROSS/BLUE SHIELD

## 2015-10-04 ENCOUNTER — Telehealth: Payer: Self-pay | Admitting: Hematology

## 2015-10-04 ENCOUNTER — Ambulatory Visit (HOSPITAL_BASED_OUTPATIENT_CLINIC_OR_DEPARTMENT_OTHER): Payer: BLUE CROSS/BLUE SHIELD

## 2015-10-04 VITALS — BP 143/59 | HR 88 | Temp 98.3°F | Resp 18 | Ht 64.0 in | Wt 169.0 lb

## 2015-10-04 DIAGNOSIS — I890 Lymphedema, not elsewhere classified: Secondary | ICD-10-CM | POA: Diagnosis not present

## 2015-10-04 DIAGNOSIS — R0789 Other chest pain: Secondary | ICD-10-CM

## 2015-10-04 DIAGNOSIS — Z95828 Presence of other vascular implants and grafts: Secondary | ICD-10-CM

## 2015-10-04 DIAGNOSIS — C50911 Malignant neoplasm of unspecified site of right female breast: Secondary | ICD-10-CM

## 2015-10-04 DIAGNOSIS — R1310 Dysphagia, unspecified: Secondary | ICD-10-CM

## 2015-10-04 DIAGNOSIS — Z171 Estrogen receptor negative status [ER-]: Secondary | ICD-10-CM

## 2015-10-04 DIAGNOSIS — E1165 Type 2 diabetes mellitus with hyperglycemia: Secondary | ICD-10-CM

## 2015-10-04 LAB — COMPREHENSIVE METABOLIC PANEL
ALBUMIN: 3.4 g/dL — AB (ref 3.5–5.0)
ALK PHOS: 101 U/L (ref 40–150)
ALT: 10 U/L (ref 0–55)
AST: 12 U/L (ref 5–34)
Anion Gap: 8 mEq/L (ref 3–11)
BUN: 11.9 mg/dL (ref 7.0–26.0)
CALCIUM: 8.7 mg/dL (ref 8.4–10.4)
CO2: 26 mEq/L (ref 22–29)
CREATININE: 0.9 mg/dL (ref 0.6–1.1)
Chloride: 103 mEq/L (ref 98–109)
EGFR: 85 mL/min/{1.73_m2} — ABNORMAL LOW (ref >90)
Glucose: 307 mg/dl — ABNORMAL HIGH (ref 70–140)
Potassium: 3.9 mEq/L (ref 3.5–5.1)
Sodium: 137 mEq/L (ref 136–145)
Total Bilirubin: <0.3 mg/dL (ref 0.20–1.20)
Total Protein: 6.3 g/dL — ABNORMAL LOW (ref 6.4–8.3)

## 2015-10-04 LAB — CBC WITH DIFFERENTIAL/PLATELET
BASO%: 0.7 % (ref 0.0–2.0)
Basophils Absolute: 0.1 10*3/uL (ref 0.0–0.1)
EOS ABS: 0.3 10*3/uL (ref 0.0–0.5)
EOS%: 4.1 % (ref 0.0–7.0)
HEMATOCRIT: 36.6 % (ref 34.8–46.6)
HGB: 12.1 g/dL (ref 11.6–15.9)
LYMPH#: 1.8 10*3/uL (ref 0.9–3.3)
LYMPH%: 23.2 % (ref 14.0–49.7)
MCH: 30.4 pg (ref 25.1–34.0)
MCHC: 33 g/dL (ref 31.5–36.0)
MCV: 92.1 fL (ref 79.5–101.0)
MONO#: 0.5 10*3/uL (ref 0.1–0.9)
MONO%: 7.2 % (ref 0.0–14.0)
NEUT%: 64.8 % (ref 38.4–76.8)
NEUTROS ABS: 4.9 10*3/uL (ref 1.5–6.5)
PLATELETS: 241 10*3/uL (ref 145–400)
RBC: 3.98 10*6/uL (ref 3.70–5.45)
RDW: 12.9 % (ref 11.2–14.5)
WBC: 7.5 10*3/uL (ref 3.9–10.3)

## 2015-10-04 MED ORDER — TRAMADOL HCL 50 MG PO TABS: ORAL_TABLET | ORAL | Status: DC

## 2015-10-04 MED ORDER — HEPARIN SOD (PORK) LOCK FLUSH 100 UNIT/ML IV SOLN
500.0000 [IU] | Freq: Once | INTRAVENOUS | Status: AC
  Administered 2015-10-04: 500 [IU] via INTRAVENOUS
  Filled 2015-10-04: qty 5

## 2015-10-04 MED ORDER — SODIUM CHLORIDE 0.9% FLUSH
10.0000 mL | INTRAVENOUS | Status: DC | PRN
  Administered 2015-10-04: 10 mL via INTRAVENOUS
  Filled 2015-10-04: qty 10

## 2015-10-04 NOTE — Patient Instructions (Signed)

## 2015-10-04 NOTE — Telephone Encounter (Signed)
Gave pt appt & avs.. Scheduled mammo

## 2015-10-04 NOTE — Progress Notes (Signed)
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    10/24/2013 Surgery Right total mastectomy of necrotizing breast tissue. Path revealed high grade carcinoma which extends into skin with involvement of dermal lymphatics.    10/25/2013 Imaging CT C/A/P negative for distant metastases.   10/26/2013 Surgery Completion right mastectomy & right axillary LN dissection: Surgical path showed high grade IDC, extending into skin. (+) LVI.  2/16 nodes positive for metastatic carcinoma. Due to prior surgery, tumor size difficult to measure, but at least T3.   11/03/2013 Echocardiogram Pre-chemo EF normal: 55-60%.    11/28/2013 - 01/09/2014 Adjuvant Chemotherapy Dose-dense AC x 4 completed.    01/23/2014 - 05/01/2014 Adjuvant Chemotherapy Weekly Carbo/Taxol x 12. [Cycle #2 had mild reaction to Taxol that resolved with treatment; Cycle #7 held d/t neutropenia, but received 1 week later].  Completed all 12 cycles.    06/20/2014 - 07/28/2014 Radiation Therapy Right chest wall and IM lymph nodes: total dose of 50.4 Gy over 28 fractions.  Right supraclavicular fossa and right axilla: total dose of 45 Gy over 25 fractions.    07/31/2014 Imaging Restaging CT c/a/p negative for distant mets.  Also bone scan negative for skeletal mets.    CURRENT THERAPY: Observation  INTERVAL HISTORY: She returns for follow up. She is doing well overall, she complains of  dysphagia and odynophagia for the past 3 months. She states she has difficulty tolerating solid food, she has been eating soft food, and completes of chest pain when she swallows. She has been taking tramadol for that. She does not recall she had EGD before. She otherwise feels well, denies any other pain, cough, dyspnea, or other symptoms. Her weight is stable.  REVIEW OF SYSTEMS:   Constitutional: Denies fevers, chills or abnormal weight loss Eyes: Denies blurriness of vision Ears, nose, mouth, throat, and face: Denies mucositis or sore throat Respiratory: Denies cough, dyspnea or wheezes Cardiovascular: Denies palpitation, chest discomfort or lower extremity swelling Gastrointestinal:  Denies nausea, heartburn or change in bowel habits Skin: Denies abnormal skin rashes Lymphatics: Denies new lymphadenopathy or easy bruising Neurological:Denies numbness, tingling or new weaknesses Behavioral/Psych: Mood is stable, no new changes  All other systems were reviewed with the patient and are negative.  MEDICAL HISTORY:  Past Medical History  Diagnosis Date  . Diabetes mellitus, type II ([place])   . Inflammatory breast cancer ([place]) 10/22/13    right  . Radiation 06/20/14-07/28/14    SURGICAL HISTORY: Past Surgical History  Procedure Laterality Date  . Cesarean section    . Ventral hernia repair  2011    repair incacerated VH with biologic mesh; cholecystectomy  . Cholecystectomy  2011  . Incision and drainage of wound Right 10/22/2013    Procedure: IRRIGATION AND DRAINAGE AND DEBRIDEMENT RIGHT BREAST WOUND;  Surgeon: [name], MD;  Location: [facility];  Service: General;  Laterality: Right;  . Breast biopsy Right 10/22/2013    Procedure: BREAST BIOPSY;  Surgeon: [name], MD;  Location: [facility];  Service: General;  Laterality: Right;  . Irrigation and debridement abscess Right 10/24/2013    Procedure: Right total mastectomy;  Surgeon: [name], MD;  Location: [facility];  Service: General;   Laterality: Right;  . Dressing change under anesthesia Right 10/26/2013    Procedure: COMPLETION MASTECTOMY, AXILLARY DISSECTION;  Surgeon: [name], MD;  Location: [facility];  Service: General;  Laterality: Right;  . Portacath placement N/A 11/16/2013    Procedure: INSERTION PORT-A-CATH;  Surgeon: [name], MD;  Location: [facility];  Service: General;  Laterality: N/A;    I have reviewed the social history and family history with the patient and they are unchanged from previous note.  ALLERGIES:  has No Known Allergies.  MEDICATIONS:  Current Outpatient Prescriptions  Medication Sig Dispense Refill  . acetaminophen (TYLENOL) 325 MG tablet Take 650 mg by mouth every 6 (six) hours as needed for mild pain.    Marland Kitchen emollient (BIAFINE) cream Apply 90 g topically 2 (two) times daily.    Marland Kitchen glucose blood test strip Use as instructed 100 each 12  . glucose monitoring kit (FREESTYLE) monitoring kit 1 each by Does not apply route as needed for other. Check blood sugars daily. 1 each 0  . Insulin Glargine (LANTUS SOLOSTAR) 100 UNIT/ML Solostar Pen Inject 10 Units into the skin daily at 10 pm. 15 mL 3  . lidocaine-prilocaine (EMLA) cream Apply 1 application topically as needed. 30 g 0  . lisinopril (PRINIVIL,ZESTRIL) 10 MG tablet   0  . loperamide (IMODIUM) 2 MG capsule Take 2 mg by mouth as needed for diarrhea or loose stools.    Marland Kitchen LORazepam (ATIVAN) 0.5 MG tablet Take 1 tablet (0.5 mg total) by mouth at bedtime as needed (Nausea or vomiting). (Patient not taking: Reported on 01/30/2015) 30 tablet 0  . ondansetron (ZOFRAN) 8 MG tablet Take 1 tablet (8 mg total) by mouth every 8 (eight) hours as needed for nausea or vomiting. (Patient not taking: Reported on 01/30/2015) 20 tablet 3  . pantoprazole (PROTONIX) 40 MG tablet   0  . pravastatin (PRAVACHOL) 10 MG tablet   0  . traMADol (ULTRAM) 50 MG tablet take 1 tablet by mouth every 6 hours if needed 60 tablet 0   No current  facility-administered medications for this visit.   Facility-Administered Medications Ordered in Other Visits  Medication Dose Route Frequency Provider Last Rate Last Dose  . sodium chloride flush (NS) 0.9 % injection 10 mL  10 mL Intravenous PRN Truitt Merle, MD   10 mL at 10/04/15 1231    PHYSICAL EXAMINATION: ECOG PERFORMANCE STATUS: 0 - Asymptomatic  Filed Vitals:   10/04/15 1308  BP: 143/59  Pulse: 88  Temp: 98.3 F (36.8 C)  Resp: 18   Filed Weights   10/04/15 1308  Weight: 169 lb (76.658 kg)    GENERAL:alert, no distress and comfortable SKIN: skin color, texture, turgor are normal, no rashes.  EYES: normal, Conjunctiva are pink and non-injected, sclera clear OROPHARYNX:no exudate, no erythema and lips, buccal mucosa, and tongue normal  NECK: supple, thyroid normal size, non-tender, without nodularity LYMPH:  no palpable lymphadenopathy in the cervical, axillary or inguinal LUNGS: clear to auscultation and percussion with normal breathing effort HEART: regular rate & rhythm and no murmurs and no lower extremity edema ABDOMEN:abdomen soft, non-tender and normal bowel sounds Musculoskeletal:no cyanosis of digits and no clubbing  NEURO: alert & oriented x 3 with fluent speech, no focal motor/sensory deficits Breast: Right breast surgically absent, (+)  significant skin pigmentation at the right front chest radiation area, skin intact. The previous large soft tissue fluid collection on the lateral chest wall still persists, Lleft breast exam showed no palpable mass, no skin change or nipple discharge. No palpable nodes on bilateral axilla.  LABORATORY DATA:  I have reviewed the data as listed CBC Latest Ref Rng 10/04/2015 06/05/2015 01/30/2015  WBC 3.9 - 10.3 10e3/uL 7.5 8.6 7.2  Hemoglobin 11.6 - 15.9 g/dL 12.1 12.0 12.4  Hematocrit 34.8 - 46.6 % 36.6 36.3 36.1  Platelets 145 - 400 10e3/uL 241 257 244     CMP Latest Ref Rng 10/04/2015 06/05/2015 01/30/2015  Glucose 70 - 140  mg/dl 307(H) 247(H) 268(H)  BUN 7.0 - 26.0 mg/dL 11.9 10.6 12.8  Creatinine 0.6 - 1.1 mg/dL 0.9 0.8 0.9  Sodium 136 - 145 mEq/L 137 136 139  Potassium 3.5 - 5.1 mEq/L 3.9 4.0 3.8  CO2 22 - 29 mEq/L '26 25 26  ' Calcium 8.4 - 10.4 mg/dL 8.7 8.9 9.2  Total Protein 6.4 - 8.3 g/dL 6.3(L) 6.8 7.1  Total Bilirubin 0.20 - 1.20 mg/dL <0.30 <0.30 <0.20  Alkaline Phos 40 - 150 U/L 101 122 134  AST 5 - 34 U/L '12 14 14  ' ALT 0 - 55 U/L '10 12 16    ' RADIOGRAPHIC STUDIES: I have personally reviewed the radiological images as listed and agreed with the findings in the report.  Diagnostic mammogram and ultrasound 01/02/2015 IMPRESSION: Benign cyst in dilated retroareolar ducts in the left breast. Small calcified fibroadenoma. No evidence of malignancy.   ASSESSMENT & PLAN: 60 year old female with past medical history of diabetes, presented with a right inflammatory breast cancer, status post right modified radical mastectomy 10/26/2013 for a cT4, pT3 pN1, stage IIIB invasive ductal carcinoma, grade 3, triple negative.  1. 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 -We discussed the high risk for cancer recurrence, especially in the first 2-3 years. I recommend closer follow-up every 3-4 months for the first 2 years, then every 6-12 months for additional 3 years.  -there is no role for adjuvant endocrine therapy. -lab results reviewed with her, CBC and CMP unremarkable except hyperglycemia -she is clinically doing well overall, no evidence of recurrence -We'll continue annual screening mammogram of the left breast. She is due in July 2017 -I encouraged her to continue healthy diet and exercise regularly.   2. Right chest wall lymphedema -Much improved after physical therapy, but not resolved   3. Diabetes, type 2 -not well controlled She will follow-up with her primary physician  4. Right chest wall pain -I refilled her  tramadol.  5. Dysphasia and odynophagia -I strongly encouraged her to see GI and have EGD -She will discuss with her primary care physician on her next appointment in a few weeks  Follow-up: I'll see her back in 4 months with lab.   All questions were answered. The patient knows to call the clinic with any problems, questions or concerns. No barriers to learning was detected.  I spent 20 minutes counseling the patient face to face. The total time spent in the appointment was 25 minutes and more than 50% was on counseling and review of test results     Truitt Merle, MD 10/04/2015 1:15 PM

## 2015-10-04 NOTE — Telephone Encounter (Signed)
left msg for added flush in july

## 2015-10-05 LAB — CANCER ANTIGEN 27-29 (PARALLEL TESTING): CA 27.29: 8 U/mL (ref <38)

## 2015-10-05 LAB — CANCER ANTIGEN 27.29: CAN 27.29: 12.7 U/mL (ref 0.0–38.6)

## 2015-10-23 ENCOUNTER — Encounter: Payer: Self-pay | Admitting: Gastroenterology

## 2015-11-13 ENCOUNTER — Ambulatory Visit (INDEPENDENT_AMBULATORY_CARE_PROVIDER_SITE_OTHER): Payer: BLUE CROSS/BLUE SHIELD | Admitting: Gastroenterology

## 2015-11-13 ENCOUNTER — Encounter: Payer: Self-pay | Admitting: Gastroenterology

## 2015-11-13 VITALS — BP 100/50 | HR 80 | Ht 64.0 in | Wt 168.0 lb

## 2015-11-13 DIAGNOSIS — E119 Type 2 diabetes mellitus without complications: Secondary | ICD-10-CM | POA: Diagnosis not present

## 2015-11-13 DIAGNOSIS — Z794 Long term (current) use of insulin: Secondary | ICD-10-CM

## 2015-11-13 DIAGNOSIS — R1310 Dysphagia, unspecified: Secondary | ICD-10-CM | POA: Diagnosis not present

## 2015-11-13 NOTE — Patient Instructions (Signed)
If you are age 61 or older, your body mass index should be between 23-30. Your Body mass index is 28.82 kg/(m^2). If this is out of the aforementioned range listed, please consider follow up with your Primary Care Provider.  If you are age 67 or younger, your body mass index should be between 19-25. Your Body mass index is 28.82 kg/(m^2). If this is out of the aformentioned range listed, please consider follow up with your Primary Care Provider.   You have been scheduled for an endoscopy and colonoscopy. Please follow the written instructions given to you at your visit today. Please pick up your prep supplies at the pharmacy within the next 1-3 days. If you use inhalers (even only as needed), please bring them with you on the day of your procedure. Your physician has requested that you go to www.startemmi.com and enter the access code given to you at your visit today. This web site gives a general overview about your procedure. However, you should still follow specific instructions given to you by our office regarding your preparation for the procedure.  Thank you for choosing [facility]  Dr Wilfrid Lund III

## 2015-11-13 NOTE — Progress Notes (Signed)
[facility] Consult Note:  History: Carmen Stone 11/13/2015  Referring physician: WHITE, Delmar Landau, NP  Reason for consult/chief complaint: Dysphagia   Subjective HPI:  Shalita was referred for several months of persistent solid food dysphagia. She is a somewhat limited historian, but the symptoms appear to be of gradual onset, she denies liquid dysphagia or weight loss. There is not odynophagia per se, but she describes a chest pain and pressure when the food feels stuck. She denies symptoms of reflux such as pyrosis or regurgitation. Few mos solid dysphagia, no wt loss, no gerd She wears her dentures "most days", at least in part because the bottom dentures are ill fitting. ROS:  Review of Systems  Constitutional: Negative for appetite change and unexpected weight change.  HENT: Negative for mouth sores and voice change.   Eyes: Negative for pain and redness.  Respiratory: Negative for cough and shortness of breath.   Cardiovascular: Negative for chest pain and palpitations.  Genitourinary: Negative for dysuria and hematuria.  Musculoskeletal: Negative for myalgias and arthralgias.  Skin: Negative for pallor and rash.  Neurological: Negative for weakness and headaches.  Hematological: Negative for adenopathy.     Past Medical History: Past Medical History  Diagnosis Date  . Diabetes mellitus, type II ([place])   . Inflammatory breast cancer ([place]) 10/22/13    right  . Radiation 06/20/14-07/28/14  . Hyperlipemia   . Hypertension      Past Surgical History: Past Surgical History  Procedure Laterality Date  . Cesarean section      x 1  . Ventral hernia repair  2011    repair incacerated VH with biologic mesh; cholecystectomy  . Cholecystectomy  2011  . Incision and drainage of wound Right 10/22/2013    Procedure: IRRIGATION AND DRAINAGE AND DEBRIDEMENT RIGHT BREAST WOUND;  Surgeon: [name], MD;  Location: [facility];  Service: General;  Laterality: Right;   . Breast biopsy Right 10/22/2013    Procedure: BREAST BIOPSY;  Surgeon: [name], MD;  Location: [facility];  Service: General;  Laterality: Right;  . Irrigation and debridement abscess Right 10/24/2013    Procedure: Right total mastectomy;  Surgeon: [name], MD;  Location: [facility];  Service: General;  Laterality: Right;  . Dressing change under anesthesia Right 10/26/2013    Procedure: COMPLETION MASTECTOMY, AXILLARY DISSECTION;  Surgeon: [name], MD;  Location: [facility];  Service: General;  Laterality: Right;  . Portacath placement N/A 11/16/2013    Procedure: INSERTION PORT-A-CATH;  Surgeon: [name], MD;  Location: [facility];  Service: General;  Laterality: N/A;     Family History: Family History  Problem Relation Age of Onset  . Lung cancer Mother     smoker  . Emphysema Father     smoker  . Colon cancer Neg Hx     Social History: Social History   Social History  . Marital Status: Single    Spouse Name: N/A  . Number of Children: 1  . Years of Education: N/A   Occupational History  . maid, cook    Social History Main Topics  . Smoking status: Never Smoker   . Smokeless tobacco: Never Used  . Alcohol Use: No  . Drug Use: No  . Sexual Activity: Not Asked     Comment: menarche age 62, first live birth age 62, P20, menopause age 31, no HRT   Other Topics Concern  . None   Social History Narrative    Allergies:  No Known Allergies  Outpatient Meds: Current Outpatient Prescriptions  Medication Sig Dispense Refill  . acetaminophen (TYLENOL) 325 MG tablet Take 650 mg by mouth every 6 (six) hours as needed for mild pain.    Marland Kitchen emollient (BIAFINE) cream Apply 90 g topically 2 (two) times daily.    Marland Kitchen glucose blood test strip Use as instructed 100 each 12  . glucose monitoring kit (FREESTYLE) monitoring kit 1 each by Does not apply route as needed for other. Check blood sugars daily. 1 each 0  . Insulin Glargine (LANTUS SOLOSTAR) 100  UNIT/ML Solostar Pen Inject 10 Units into the skin daily at 10 pm. 15 mL 3  . lidocaine-prilocaine (EMLA) cream Apply 1 application topically as needed. 30 g 0  . lisinopril (PRINIVIL,ZESTRIL) 10 MG tablet Take 10 mg by mouth daily.   0  . loperamide (IMODIUM) 2 MG capsule Take 2 mg by mouth as needed for diarrhea or loose stools.    Marland Kitchen LORazepam (ATIVAN) 0.5 MG tablet Take 1 tablet (0.5 mg total) by mouth at bedtime as needed (Nausea or vomiting). 30 tablet 0  . ondansetron (ZOFRAN) 8 MG tablet Take 1 tablet (8 mg total) by mouth every 8 (eight) hours as needed for nausea or vomiting. 20 tablet 3  . pantoprazole (PROTONIX) 40 MG tablet Take 40 mg by mouth daily.   0  . pravastatin (PRAVACHOL) 10 MG tablet Take 10 mg by mouth daily.   0   No current facility-administered medications for this visit.      ___________________________________________________________________ Objective  Exam:  BP 100/50 mmHg  Pulse 80  Ht _0  (1.626 m)  Wt 168 lb (76.204 kg)  BMI 28.82 kg/m2   General: this is a(n) Well-appearing woman without muscle wasting  Eyes: sclera anicteric, no redness  ENT: oral mucosa moist without lesions, no cervical or supraclavicular lymphadenopathy, good dentition  CV: RRR without murmur, S1/S2, no JVD, no peripheral edema  Resp: clear to auscultation bilaterally, normal RR and effort noted  GI: soft, no tenderness, with active bowel sounds. No guarding or palpable organomegaly noted.  Skin; warm and dry, no rash or jaundice noted  Neuro: awake, alert and oriented x 3. Normal gross motor function and fluent speech   Data Last hemoglobin A1c 10.7   Assessment: Encounter Diagnoses  Name Primary?  Marland Kitchen Dysphagia Yes  . Type 2 diabetes mellitus without complication, with long-term current use of insulin (HCC)     Difficult to tell if mechanical cause such as stricture or motility, perhaps related to some poorly controlled diabetes.  Plan:  Upper endoscopy  and possible dilation  The benefits and risks of the planned procedure were described in detail with the patient or (when appropriate) their health care proxy.  Risks were outlined as including, but not limited to, bleeding, infection, perforation, adverse medication reaction leading to cardiac or pulmonary decompensation, or pancreatitis (if ERCP).  The limitation of incomplete mucosal visualization was also discussed.  No guarantees or warranties were given.   Thank you for the courtesy of this consult.  Please call me with any questions or concerns.  Nelida Meuse III  CC: WHITE, MARSHA L, NP

## 2015-11-14 ENCOUNTER — Telehealth: Payer: Self-pay | Admitting: Hematology

## 2015-11-14 NOTE — Telephone Encounter (Signed)
pt cld want3ed to move flush to 6/9 gave r/s time & date for 1:00 6/9

## 2015-11-15 ENCOUNTER — Ambulatory Visit (HOSPITAL_BASED_OUTPATIENT_CLINIC_OR_DEPARTMENT_OTHER): Payer: BLUE CROSS/BLUE SHIELD

## 2015-11-15 VITALS — BP 138/63 | HR 97 | Temp 98.8°F

## 2015-11-15 DIAGNOSIS — C50911 Malignant neoplasm of unspecified site of right female breast: Secondary | ICD-10-CM

## 2015-11-15 DIAGNOSIS — Z95828 Presence of other vascular implants and grafts: Secondary | ICD-10-CM

## 2015-11-15 DIAGNOSIS — Z452 Encounter for adjustment and management of vascular access device: Secondary | ICD-10-CM

## 2015-11-15 MED ORDER — SODIUM CHLORIDE 0.9% FLUSH
10.0000 mL | INTRAVENOUS | Status: DC | PRN
  Administered 2015-11-15: 10 mL via INTRAVENOUS
  Filled 2015-11-15: qty 10

## 2015-11-15 MED ORDER — HEPARIN SOD (PORK) LOCK FLUSH 100 UNIT/ML IV SOLN
500.0000 [IU] | Freq: Once | INTRAVENOUS | Status: AC
  Administered 2015-11-15: 500 [IU] via INTRAVENOUS
  Filled 2015-11-15: qty 5

## 2015-11-19 ENCOUNTER — Encounter: Payer: Self-pay | Admitting: Gastroenterology

## 2015-11-23 ENCOUNTER — Encounter: Payer: BLUE CROSS/BLUE SHIELD | Admitting: Gastroenterology

## 2015-11-23 ENCOUNTER — Telehealth: Payer: Self-pay | Admitting: Gastroenterology

## 2015-11-23 NOTE — Telephone Encounter (Signed)
no

## 2015-11-30 ENCOUNTER — Other Ambulatory Visit: Payer: Self-pay | Admitting: *Deleted

## 2015-11-30 MED ORDER — TRAMADOL HCL 50 MG PO TABS: 50.0000 mg | ORAL_TABLET | Freq: Four times a day (QID) | ORAL | Status: DC | PRN

## 2015-12-05 ENCOUNTER — Other Ambulatory Visit: Payer: Self-pay | Admitting: Hematology

## 2015-12-05 ENCOUNTER — Ambulatory Visit
Admission: RE | Admit: 2015-12-05 | Discharge: 2015-12-05 | Disposition: A | Discharge status: Home / Self Care | Payer: BLUE CROSS/BLUE SHIELD | Source: Ambulatory Visit | Attending: Hematology | Admitting: Hematology

## 2015-12-05 DIAGNOSIS — Z1231 Encounter for screening mammogram for malignant neoplasm of breast: Secondary | ICD-10-CM

## 2015-12-05 DIAGNOSIS — C50911 Malignant neoplasm of unspecified site of right female breast: Secondary | ICD-10-CM

## 2015-12-10 ENCOUNTER — Ambulatory Visit (AMBULATORY_SURGERY_CENTER): Payer: BLUE CROSS/BLUE SHIELD | Admitting: Gastroenterology

## 2015-12-10 ENCOUNTER — Encounter: Payer: Self-pay | Admitting: Gastroenterology

## 2015-12-10 VITALS — BP 140/77 | HR 79 | Temp 98.0°F | Resp 17 | Ht 64.0 in | Wt 168.0 lb

## 2015-12-10 DIAGNOSIS — R1310 Dysphagia, unspecified: Secondary | ICD-10-CM | POA: Diagnosis not present

## 2015-12-10 DIAGNOSIS — K222 Esophageal obstruction: Secondary | ICD-10-CM

## 2015-12-10 LAB — GLUCOSE, CAPILLARY
Glucose-Capillary: 123 mg/dL — ABNORMAL HIGH (ref 65–99)
Glucose-Capillary: 104 mg/dL — ABNORMAL HIGH (ref 65–99)

## 2015-12-10 MED ORDER — SODIUM CHLORIDE 0.9 % IV SOLN: 500.0000 mL | INTRAVENOUS | Status: DC

## 2015-12-10 NOTE — Progress Notes (Signed)
Stable to RR 

## 2015-12-10 NOTE — Patient Instructions (Signed)
YOU HAD AN ENDOSCOPIC PROCEDURE TODAY AT [facility]:   Refer to the procedure report that was given to you for any specific questions about what was found during the examination.  If the procedure report does not answer your questions, please call your gastroenterologist to clarify.  If you requested that your care partner not be given the details of your procedure findings, then the procedure report has been included in a sealed envelope for you to review at your convenience later.  YOU SHOULD EXPECT: Some feelings of bloating in the abdomen. Passage of more gas than usual.  Walking can help get rid of the air that was put into your GI tract during the procedure and reduce the bloating. If you had a lower endoscopy (such as a colonoscopy or flexible sigmoidoscopy) you may notice spotting of blood in your stool or on the toilet paper. If you underwent a bowel prep for your procedure, you may not have a normal bowel movement for a few days.  Please Note:  You might notice some irritation and congestion in your nose or some drainage.  This is from the oxygen used during your procedure.  There is no need for concern and it should clear up in a day or so.  SYMPTOMS TO REPORT IMMEDIATELY:   FolloFollowing upper endoscopy (EGD)  Vomiting of blood or coffee ground material  New chest pain or pain under the shoulder blades  Painful or persistently difficult swallowing  New shortness of breath  Fever of 100F or higher  Black, tarry-looking stools  For urgent or emergent issues, a gastroenterologist can be reached at any hour by calling [phone].   DIET: Your first meal following the procedure should be a small meal and then it is ok to progress to your normal diet. Heavy or fried foods are harder to digest and may make you feel nauseous or bloated.  Likewise, meals heavy in dairy and vegetables can increase bloating.  Drink plenty of fluids but you should avoid alcoholic beverages  for 24 hours.  ACTIVITY:  You should plan to take it easy for the rest of today and you should NOT DRIVE or use heavy machinery until tomorrow (because of the sedation medicines used during the test).    FOLLOW UP: Our staff will call the number listed on your records the next business day following your procedure to check on you and address any questions or concerns that you may have regarding the information given to you following your procedure. If we do not reach you, we will leave a message.  However, if you are feeling well and you are not experiencing any problems, there is no need to return our call.  We will assume that you have returned to your regular daily activities without incident.  If any biopsies were taken you will be contacted by phone or by letter within the next 1-3 weeks.  Please call us at [phone] if you have not heard about the biopsies in 3 weeks.    SIGNATURES/CONFIDENTIALITY: You and/or your care partner have signed paperwork which will be entered into your electronic medical record.  These signatures attest to the fact that that the information above on your After Visit Summary has been reviewed and is understood.  Full responsibility of the confidentiality of this discharge information lies with you and/or your care-partner.  Hiatal hernia information given.  To improve swallowing use dentures consistantly, and try to keep good control of diabetes.

## 2015-12-10 NOTE — Op Note (Signed)
[place] Patient Name: Carmen Stone Procedure Date: 12/10/2015 2:36 PM MRN: [id number] Endoscopist: Mallie Mussel L. Loletha Carrow , MD Age: 60 Referring MD:  Date of Birth: 11-02-1955 Gender: Female Account #: [id number] Procedure:                Upper GI endoscopy Indications:              Dysphagia Medicines:                Monitored Anesthesia Care Procedure:                Pre-Anesthesia Assessment:                           - Prior to the procedure, a History and Physical                            was performed, and patient medications and                            allergies were reviewed. The patient's tolerance of                            previous anesthesia was also reviewed. The risks                            and benefits of the procedure and the sedation                            options and risks were discussed with the patient.                            All questions were answered, and informed consent                            was obtained. Prior Anticoagulants: The patient has                            taken aspirin, last dose was 1 day prior to                            procedure. ASA Grade Assessment: III - A patient                            with severe systemic disease. After reviewing the                            risks and benefits, the patient was deemed in                            satisfactory condition to undergo the procedure.                           After obtaining informed consent, the endoscope was  passed under direct vision. Throughout the                            procedure, the patient's blood pressure, pulse, and                            oxygen saturations were monitored continuously. The                            Model [id number]-[id number]) scope was introduced                            through the mouth, and advanced to the second part                            of duodenum. The upper GI endoscopy  was                            accomplished without difficulty. The patient                            tolerated the procedure well. Scope In: Scope Out: Findings:                 The larynx was normal.                           A small hiatal hernia was present.                           A widely patent Schatzki ring (acquired) was found                            in the lower third of the esophagus.                           The stomach was normal.                           The cardia and gastric fundus were normal on                            retroflexion.                           The examined duodenum was normal. Complications:            No immediate complications. Estimated Blood Loss:     Estimated blood loss: none. Impression:               - Normal larynx.                           - Small hiatal hernia.                           - Widely patent Schatzki ring.                           -  Normal stomach.                           - Normal examined duodenum.                           - No specimens collected. Recommendation:           - Patient has a contact number available for                            emergencies. The signs and symptoms of potential                            delayed complications were discussed with the                            patient. Return to normal activities tomorrow.                            Written discharge instructions were provided to the                            patient.                           - Resume previous diet.                           - Continue present medications.                           - Dysphagia would improve with better long-term                            diabetic control and more consistent use of                            dentures. Henry L. Loletha Carrow, MD 12/10/2015 2:54:33 PM This report has been signed electronically.

## 2015-12-11 ENCOUNTER — Telehealth: Payer: Self-pay | Admitting: *Deleted

## 2015-12-11 NOTE — Telephone Encounter (Signed)
  Follow up Call-  Call back number 12/10/2015  Post procedure Call Back phone  # [phone]  Permission to leave phone message Yes     Patient questions:  Do you have a fever, pain , or abdominal swelling? No. Pain Score  0 *  Have you tolerated food without any problems? Yes.    Have you been able to return to your normal activities? Yes.    Do you have any questions about your discharge instructions: Diet   No. Medications  No. Follow up visit  No.  Do you have questions or concerns about your Care? No.  Actions: * If pain score is 4 or above: No action needed, pain <4.

## 2015-12-22 ENCOUNTER — Other Ambulatory Visit: Payer: Self-pay | Admitting: Hematology

## 2015-12-27 ENCOUNTER — Ambulatory Visit (HOSPITAL_BASED_OUTPATIENT_CLINIC_OR_DEPARTMENT_OTHER): Payer: BLUE CROSS/BLUE SHIELD

## 2015-12-27 VITALS — BP 114/59 | HR 95 | Temp 98.6°F | Resp 16

## 2015-12-27 DIAGNOSIS — Z95828 Presence of other vascular implants and grafts: Secondary | ICD-10-CM

## 2015-12-27 DIAGNOSIS — Z452 Encounter for adjustment and management of vascular access device: Secondary | ICD-10-CM | POA: Diagnosis not present

## 2015-12-27 DIAGNOSIS — C50911 Malignant neoplasm of unspecified site of right female breast: Secondary | ICD-10-CM

## 2015-12-27 MED ORDER — HEPARIN SOD (PORK) LOCK FLUSH 100 UNIT/ML IV SOLN
500.0000 [IU] | Freq: Once | INTRAVENOUS | Status: AC
  Administered 2015-12-27: 500 [IU] via INTRAVENOUS
  Filled 2015-12-27: qty 5

## 2015-12-27 MED ORDER — SODIUM CHLORIDE 0.9% FLUSH
10.0000 mL | INTRAVENOUS | Status: DC | PRN
  Administered 2015-12-27: 10 mL via INTRAVENOUS
  Filled 2015-12-27: qty 10

## 2015-12-27 NOTE — Patient Instructions (Signed)

## 2016-01-19 ENCOUNTER — Other Ambulatory Visit: Payer: Self-pay | Admitting: Hematology

## 2016-01-31 ENCOUNTER — Ambulatory Visit (HOSPITAL_BASED_OUTPATIENT_CLINIC_OR_DEPARTMENT_OTHER): Payer: BLUE CROSS/BLUE SHIELD

## 2016-01-31 ENCOUNTER — Encounter: Payer: Self-pay | Admitting: Hematology

## 2016-01-31 ENCOUNTER — Ambulatory Visit (HOSPITAL_BASED_OUTPATIENT_CLINIC_OR_DEPARTMENT_OTHER): Payer: BLUE CROSS/BLUE SHIELD | Admitting: Hematology

## 2016-01-31 ENCOUNTER — Telehealth: Payer: Self-pay | Admitting: Hematology

## 2016-01-31 ENCOUNTER — Other Ambulatory Visit (HOSPITAL_BASED_OUTPATIENT_CLINIC_OR_DEPARTMENT_OTHER): Payer: BLUE CROSS/BLUE SHIELD

## 2016-01-31 VITALS — BP 121/63 | HR 72 | Temp 98.8°F | Resp 18 | Ht 64.0 in | Wt 171.8 lb

## 2016-01-31 DIAGNOSIS — C50911 Malignant neoplasm of unspecified site of right female breast: Secondary | ICD-10-CM

## 2016-01-31 DIAGNOSIS — I890 Lymphedema, not elsewhere classified: Secondary | ICD-10-CM

## 2016-01-31 DIAGNOSIS — Z853 Personal history of malignant neoplasm of breast: Secondary | ICD-10-CM | POA: Diagnosis not present

## 2016-01-31 DIAGNOSIS — M79671 Pain in right foot: Secondary | ICD-10-CM

## 2016-01-31 DIAGNOSIS — Z95828 Presence of other vascular implants and grafts: Secondary | ICD-10-CM | POA: Insufficient documentation

## 2016-01-31 DIAGNOSIS — M79601 Pain in right arm: Secondary | ICD-10-CM | POA: Diagnosis not present

## 2016-01-31 DIAGNOSIS — E1165 Type 2 diabetes mellitus with hyperglycemia: Secondary | ICD-10-CM

## 2016-01-31 LAB — CBC WITH DIFFERENTIAL/PLATELET
BASO%: 0.7 % (ref 0.0–2.0)
Basophils Absolute: 0.1 10*3/uL (ref 0.0–0.1)
EOS%: 2.5 % (ref 0.0–7.0)
Eosinophils Absolute: 0.2 10*3/uL (ref 0.0–0.5)
HCT: 36.3 % (ref 34.8–46.6)
HGB: 12 g/dL (ref 11.6–15.9)
LYMPH#: 1.9 10*3/uL (ref 0.9–3.3)
LYMPH%: 21.4 % (ref 14.0–49.7)
MCH: 30.4 pg (ref 25.1–34.0)
MCHC: 33 g/dL (ref 31.5–36.0)
MCV: 92.1 fL (ref 79.5–101.0)
MONO#: 0.6 10*3/uL (ref 0.1–0.9)
MONO%: 7.2 % (ref 0.0–14.0)
NEUT#: 6.1 10*3/uL (ref 1.5–6.5)
NEUT%: 68.2 % (ref 38.4–76.8)
Platelets: 268 10*3/uL (ref 145–400)
RBC: 3.94 10*6/uL (ref 3.70–5.45)
RDW: 13.3 % (ref 11.2–14.5)
WBC: 9 10*3/uL (ref 3.9–10.3)

## 2016-01-31 LAB — COMPREHENSIVE METABOLIC PANEL
ALT: 14 U/L (ref 0–55)
AST: 13 U/L (ref 5–34)
Albumin: 3.4 g/dL — ABNORMAL LOW (ref 3.5–5.0)
Alkaline Phosphatase: 109 U/L (ref 40–150)
Anion Gap: 10 mEq/L (ref 3–11)
BUN: 14.4 mg/dL (ref 7.0–26.0)
CHLORIDE: 104 meq/L (ref 98–109)
CO2: 24 meq/L (ref 22–29)
CREATININE: 0.9 mg/dL (ref 0.6–1.1)
Calcium: 9.6 mg/dL (ref 8.4–10.4)
EGFR: 77 mL/min/{1.73_m2} — ABNORMAL LOW (ref >90)
Glucose: 335 mg/dl — ABNORMAL HIGH (ref 70–140)
Potassium: 4.1 mEq/L (ref 3.5–5.1)
Sodium: 138 mEq/L (ref 136–145)
Total Bilirubin: 0.39 mg/dL (ref 0.20–1.20)
Total Protein: 6.6 g/dL (ref 6.4–8.3)

## 2016-01-31 MED ORDER — SODIUM CHLORIDE 0.9 % IJ SOLN
10.0000 mL | INTRAMUSCULAR | Status: DC | PRN
  Administered 2016-01-31: 10 mL via INTRAVENOUS
  Filled 2016-01-31: qty 10

## 2016-01-31 MED ORDER — TRAMADOL HCL 50 MG PO TABS: ORAL_TABLET | ORAL | 0 refills | Status: DC

## 2016-01-31 MED ORDER — HEPARIN SOD (PORK) LOCK FLUSH 100 UNIT/ML IV SOLN
500.0000 [IU] | Freq: Once | INTRAVENOUS | Status: AC | PRN
  Administered 2016-01-31: 500 [IU] via INTRAVENOUS
  Filled 2016-01-31: qty 5

## 2016-01-31 NOTE — Progress Notes (Signed)
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      10/24/2013 Surgery    Right total mastectomy of necrotizing breast tissue. Path revealed high grade carcinoma which extends into skin with involvement of dermal lymphatics.      10/25/2013 Imaging    CT C/A/P negative for distant metastases.     10/26/2013 Surgery    Completion right mastectomy & right axillary LN dissection: Surgical path showed high grade IDC, extending into skin. (+) LVI.  2/16 nodes positive for metastatic carcinoma. Due to prior surgery, tumor size difficult to measure, but at least T3.     11/03/2013 Echocardiogram    Pre-chemo EF normal: 55-60%.      11/28/2013 - 01/09/2014 Adjuvant Chemotherapy    Dose-dense AC x 4 completed.      01/23/2014 - 05/01/2014 Adjuvant Chemotherapy    Weekly Carbo/Taxol x 12. [Cycle #2 had mild reaction to Taxol that resolved with treatment; Cycle #7 held d/t neutropenia, but received 1 week later].  Completed all 12 cycles.      06/20/2014 - 07/28/2014 Radiation Therapy    Right chest wall and IM lymph nodes: total dose of 50.4 Gy over 28 fractions.  Right supraclavicular fossa and right axilla: total dose of 45 Gy over 25 fractions.      07/31/2014 Imaging    Restaging CT c/a/p negative for distant mets.  Also bone scan negative for skeletal mets.      CURRENT THERAPY: Observation  INTERVAL HISTORY: She returns for  follow up. She is doing well overall, her dysphagia And odynophagia has resolved, she had EGD which was unremarkable. She has intermittent mild pain and tingling in her right arm, which has been going on since her breast surgery. She also reports new pain at the bottom of right foot without any skin changes. She has been taking tramadol 0-2 tab a day. She otherwise feels well, has good appetite and energy level, remains to be active. She is scheduled to have a port and the possible hernia surgery next month.  REVIEW OF SYSTEMS:   Constitutional: Denies fevers, chills or abnormal weight loss Eyes: Denies blurriness of vision Ears, nose, mouth, throat, and face: Denies mucositis or sore throat   Respiratory: Denies cough, dyspnea or wheezes Cardiovascular: Denies palpitation, chest discomfort or lower extremity swelling Gastrointestinal:  Denies nausea, heartburn or change in bowel habits Skin: Denies abnormal skin rashes Lymphatics: Denies new lymphadenopathy or easy bruising Neurological:Denies numbness, tingling or new weaknesses Behavioral/Psych: Mood is stable, no new changes  All other systems were reviewed with the patient and are negative.  MEDICAL HISTORY:  Past Medical History:  Diagnosis Date  . Diabetes mellitus, type II ([place])   . Hyperlipemia   . Hypertension   . Inflammatory breast cancer ([place]) 10/22/13   right  . Radiation 06/20/14-07/28/14    SURGICAL HISTORY: Past Surgical History:  Procedure Laterality Date  . BREAST BIOPSY Right 10/22/2013   Procedure: BREAST BIOPSY;  Surgeon: Leighton Ruff  Redmond Pulling, MD;  Location: [facility];  Service: General;  Laterality: Right;  . CESAREAN SECTION     x 1  . CHOLECYSTECTOMY  2011  . DRESSING CHANGE UNDER ANESTHESIA Right 10/26/2013   Procedure: COMPLETION MASTECTOMY, AXILLARY DISSECTION;  Surgeon: [name], MD;  Location: [facility];  Service: General;  Laterality: Right;  . INCISION AND DRAINAGE OF WOUND Right 10/22/2013   Procedure:  IRRIGATION AND DRAINAGE AND DEBRIDEMENT RIGHT BREAST WOUND;  Surgeon: [name], MD;  Location: [facility];  Service: General;  Laterality: Right;  . IRRIGATION AND DEBRIDEMENT ABSCESS Right 10/24/2013   Procedure: Right total mastectomy;  Surgeon: [name], MD;  Location: [facility];  Service: General;  Laterality: Right;  . PORTACATH PLACEMENT N/A 11/16/2013   Procedure: INSERTION PORT-A-CATH;  Surgeon: [name], MD;  Location: [facility];  Service: General;  Laterality: N/A;  . VENTRAL HERNIA REPAIR  2011   repair incacerated VH with biologic mesh; cholecystectomy    I have reviewed the social history and family history with the patient and they are unchanged from previous note.  ALLERGIES:  has No Known Allergies.  MEDICATIONS:  Current Outpatient Prescriptions  Medication Sig Dispense Refill  . acetaminophen (TYLENOL) 325 MG tablet Take 650 mg by mouth every 6 (six) hours as needed for mild pain.    Marland Kitchen emollient (BIAFINE) cream Apply 90 g topically 2 (two) times daily.    Marland Kitchen glucose blood test strip Use as instructed 100 each 12  . glucose monitoring kit (FREESTYLE) monitoring kit 1 each by Does not apply route as needed for other. Check blood sugars daily. 1 each 0  . Insulin Glargine (LANTUS SOLOSTAR) 100 UNIT/ML Solostar Pen Inject 10 Units into the skin daily at 10 pm. 15 mL 3  . lidocaine-prilocaine (EMLA) cream Apply 1 application topically as needed. 30 g 0  . lisinopril (PRINIVIL,ZESTRIL) 10 MG tablet Take 10 mg by mouth daily.   0  . loperamide (IMODIUM) 2 MG capsule Take 2 mg by mouth as needed for diarrhea or loose stools.    Marland Kitchen LORazepam (ATIVAN) 0.5 MG tablet Take 1 tablet (0.5 mg total) by mouth at bedtime as needed (Nausea or vomiting). 30 tablet 0  . ondansetron (ZOFRAN) 8 MG tablet Take 1 tablet (8 mg total) by mouth every 8 (eight) hours as needed for nausea or vomiting. 20 tablet 3  . pravastatin (PRAVACHOL) 10 MG tablet Take 10 mg by mouth  daily.   0  . traMADol (ULTRAM) 50 MG tablet take 1 tablet by mouth every 6 hours if needed for pain 60 tablet 0   No current facility-administered medications for this visit.     PHYSICAL EXAMINATION: ECOG PERFORMANCE STATUS: 0 - Asymptomatic  Vitals:   01/31/16 1251  BP: 121/63  Pulse: 72  Resp: 18  Temp: 98.8 F (37.1 C)   Filed Weights   01/31/16 1251  Weight: 171 lb 12.8 oz (77.9 kg)    GENERAL:alert, no distress and comfortable SKIN: skin color, texture, turgor are normal, no rashes.  EYES: normal, Conjunctiva are pink and non-injected, sclera clear OROPHARYNX:no exudate, no erythema and lips, buccal mucosa, and tongue normal  NECK: supple, thyroid normal size, non-tender, without nodularity LYMPH:  no palpable lymphadenopathy in the cervical, axillary or inguinal LUNGS: clear to auscultation and percussion with normal breathing effort HEART: regular rate & rhythm and no murmurs and no lower extremity edema ABDOMEN:abdomen soft, non-tender and normal bowel sounds Musculoskeletal:no cyanosis of digits and  no clubbing  NEURO: alert & oriented x 3 with fluent speech, no focal motor/sensory deficits Breast: Right breast surgically absent, (+) significant skin pigmentation at the right front chest radiation area, skin intact. The previous large soft tissue fluid collection on the lateral chest wall still persists, Lleft breast exam showed no palpable mass, no skin change or nipple discharge. No palpable nodes on bilateral axilla.  LABORATORY DATA:  I have reviewed the data as listed CBC Latest Ref Rng & Units 01/31/2016 10/04/2015 06/05/2015  WBC 3.9 - 10.3 10e3/uL 9.0 7.5 8.6  Hemoglobin 11.6 - 15.9 g/dL 12.0 12.1 12.0  Hematocrit 34.8 - 46.6 % 36.3 36.6 36.3  Platelets 145 - 400 10e3/uL 268 241 257     CMP Latest Ref Rng & Units 01/31/2016 10/04/2015 06/05/2015  Glucose 70 - 140 mg/dl 335(H) 307(H) 247(H)  BUN 7.0 - 26.0 mg/dL 14.4 11.9 10.6  Creatinine 0.6 - 1.1 mg/dL  0.9 0.9 0.8  Sodium 136 - 145 mEq/L 138 137 136  Potassium 3.5 - 5.1 mEq/L 4.1 3.9 4.0  Chloride 96 - 112 mEq/L - - -  CO2 22 - 29 mEq/L '24 26 25  ' Calcium 8.4 - 10.4 mg/dL 9.6 8.7 8.9  Total Protein 6.4 - 8.3 g/dL 6.6 6.3(L) 6.8  Total Bilirubin 0.20 - 1.20 mg/dL 0.39 <0.30 <0.30  Alkaline Phos 40 - 150 U/L 109 101 122  AST 5 - 34 U/L '13 12 14  ' ALT 0 - 55 U/L '14 10 12    ' RADIOGRAPHIC STUDIES: I have personally reviewed the radiological images as listed and agreed with the findings in the report.  Diagnostic mammogram and ultrasound 12/06/2015 IMPRESSION: No mammographic evidence of malignancy. A result letter of this screening mammogram will be mailed directly to the patient.  RECOMMENDATION: Screening mammogram in one year. (Code:SM-B-01Y)  ASSESSMENT & PLAN: 60 year old female with past medical history of diabetes, presented with a right inflammatory breast cancer, status post right modified radical mastectomy 10/26/2013 for a cT4, pT3 pN1, stage IIIB invasive ductal carcinoma, grade 3, triple negative.  1. 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  -We discussed the high risk for cancer recurrence, especially in the first 2-3 years. I recommend closer follow-up every 3-4 months for the first 2-3 years, then every 6-12 months for a total 5 years -there is no role for adjuvant endocrine therapy. -It has been over 2 years since her diagnosis, her risk of recurrence has certainly decreased. -lab results reviewed with her, CBC and CMP unremarkable except hyperglycemia -she is clinically doing well overall, her last mammogram was normal 2 months ago. no evidence of recurrence -We'll continue cancer surveillance -I encouraged her to continue healthy diet and exercise regularly.   2. Right chest wall lymphedema -Much improved after physical therapy.   3. Diabetes, type 2 -not well controlled She will follow-up with her primary physician  4.  Right arm and foot pain -Possibly related to her prior chemotherapy and surgery -I refilled her tramadol.   Follow-up: I'll see her back in 4 months with lab.   All questions were answered. The patient knows to call the clinic with any problems, questions or concerns. No barriers to learning was detected.  I spent 20 minutes counseling the patient face to face. The total time spent in the appointment was 25 minutes and more than 50% was on counseling and review of test results     Truitt Merle, MD 01/31/16 1:25 PM

## 2016-01-31 NOTE — Telephone Encounter (Signed)
Gv pt appt for 05/29/16.

## 2016-02-01 LAB — CANCER ANTIGEN 27.29: CAN 27.29: 9.7 U/mL (ref 0.0–38.6)

## 2016-02-01 LAB — CANCER ANTIGEN 27-29 (PARALLEL TESTING): CA 27.29: <8 U/mL (ref <38)

## 2016-03-05 ENCOUNTER — Other Ambulatory Visit: Payer: Self-pay | Admitting: *Deleted

## 2016-03-05 MED ORDER — TRAMADOL HCL 50 MG PO TABS
ORAL_TABLET | ORAL | 1 refills | Status: DC
Start: 2016-03-05 — End: 2016-05-13

## 2016-03-13 ENCOUNTER — Encounter: Payer: Self-pay | Admitting: Hematology

## 2016-03-13 NOTE — Progress Notes (Signed)
Obtained signature from physician for PAF. Faxed to [facility] [employer]. Fax received ok per confirmation sheet.

## 2016-05-13 ENCOUNTER — Telehealth: Payer: Self-pay

## 2016-05-13 MED ORDER — TRAMADOL HCL 50 MG PO TABS: ORAL_TABLET | ORAL | 1 refills | Status: DC

## 2016-05-13 NOTE — Telephone Encounter (Signed)
Pt called for help with tramadol refill. Tramadol e-scribed but rx printed so I called rx to [facility] on [address].

## 2016-05-29 ENCOUNTER — Encounter: Payer: Self-pay | Admitting: Hematology

## 2016-05-29 ENCOUNTER — Ambulatory Visit (HOSPITAL_BASED_OUTPATIENT_CLINIC_OR_DEPARTMENT_OTHER): Payer: BLUE CROSS/BLUE SHIELD | Admitting: Hematology

## 2016-05-29 ENCOUNTER — Other Ambulatory Visit (HOSPITAL_BASED_OUTPATIENT_CLINIC_OR_DEPARTMENT_OTHER): Payer: BLUE CROSS/BLUE SHIELD

## 2016-05-29 ENCOUNTER — Ambulatory Visit (HOSPITAL_BASED_OUTPATIENT_CLINIC_OR_DEPARTMENT_OTHER): Payer: BLUE CROSS/BLUE SHIELD

## 2016-05-29 ENCOUNTER — Telehealth: Payer: Self-pay | Admitting: Hematology

## 2016-05-29 VITALS — BP 103/49 | HR 91 | Temp 98.6°F | Resp 17 | Ht 64.0 in | Wt 167.3 lb

## 2016-05-29 DIAGNOSIS — Z853 Personal history of malignant neoplasm of breast: Secondary | ICD-10-CM

## 2016-05-29 DIAGNOSIS — R252 Cramp and spasm: Secondary | ICD-10-CM

## 2016-05-29 DIAGNOSIS — E1165 Type 2 diabetes mellitus with hyperglycemia: Secondary | ICD-10-CM

## 2016-05-29 DIAGNOSIS — C50911 Malignant neoplasm of unspecified site of right female breast: Secondary | ICD-10-CM

## 2016-05-29 DIAGNOSIS — Z95828 Presence of other vascular implants and grafts: Secondary | ICD-10-CM

## 2016-05-29 LAB — CBC WITH DIFFERENTIAL/PLATELET
BASO%: 0.2 % (ref 0.0–2.0)
Basophils Absolute: 0 10*3/uL (ref 0.0–0.1)
EOS%: 1.7 % (ref 0.0–7.0)
Eosinophils Absolute: 0.2 10*3/uL (ref 0.0–0.5)
HCT: 37.5 % (ref 34.8–46.6)
HGB: 12.5 g/dL (ref 11.6–15.9)
LYMPH%: 24 % (ref 14.0–49.7)
MCH: 30 pg (ref 25.1–34.0)
MCHC: 33.3 g/dL (ref 31.5–36.0)
MCV: 90.1 fL (ref 79.5–101.0)
MONO#: 0.5 10*3/uL (ref 0.1–0.9)
MONO%: 5.4 % (ref 0.0–14.0)
NEUT%: 68.7 % (ref 38.4–76.8)
NEUTROS ABS: 6.7 10*3/uL — AB (ref 1.5–6.5)
Platelets: 246 10*3/uL (ref 145–400)
RBC: 4.16 10*6/uL (ref 3.70–5.45)
RDW: 13 % (ref 11.2–14.5)
WBC: 9.7 10*3/uL (ref 3.9–10.3)
lymph#: 2.3 10*3/uL (ref 0.9–3.3)

## 2016-05-29 LAB — COMPREHENSIVE METABOLIC PANEL
ALT: 16 U/L (ref 0–55)
AST: 15 U/L (ref 5–34)
Albumin: 3.5 g/dL (ref 3.5–5.0)
Alkaline Phosphatase: 131 U/L (ref 40–150)
Anion Gap: 11 mEq/L (ref 3–11)
BILIRUBIN TOTAL: 0.31 mg/dL (ref 0.20–1.20)
BUN: 13 mg/dL (ref 7.0–26.0)
CO2: 23 meq/L (ref 22–29)
CREATININE: 1 mg/dL (ref 0.6–1.1)
Calcium: 9.1 mg/dL (ref 8.4–10.4)
Chloride: 102 mEq/L (ref 98–109)
EGFR: 74 mL/min/{1.73_m2} — AB (ref >90)
GLUCOSE: 483 mg/dL — AB (ref 70–140)
Potassium: 4.2 mEq/L (ref 3.5–5.1)
SODIUM: 136 meq/L (ref 136–145)
TOTAL PROTEIN: 7 g/dL (ref 6.4–8.3)

## 2016-05-29 MED ORDER — SODIUM CHLORIDE 0.9 % IJ SOLN
10.0000 mL | INTRAMUSCULAR | Status: DC | PRN
  Administered 2016-05-29: 10 mL via INTRAVENOUS
  Filled 2016-05-29: qty 10

## 2016-05-29 MED ORDER — HEPARIN SOD (PORK) LOCK FLUSH 100 UNIT/ML IV SOLN
500.0000 [IU] | Freq: Once | INTRAVENOUS | Status: AC | PRN
  Administered 2016-05-29: 500 [IU] via INTRAVENOUS
  Filled 2016-05-29: qty 5

## 2016-05-29 NOTE — Progress Notes (Signed)
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       10/24/2013 Surgery    Right total mastectomy of necrotizing breast tissue. Path revealed high grade carcinoma which extends into skin with involvement of dermal lymphatics.       10/25/2013 Imaging    CT C/A/P negative for distant metastases.      10/26/2013 Surgery    Completion right mastectomy & right axillary LN dissection: Surgical path showed high grade IDC, extending into skin. (+) LVI.  2/16 nodes positive for metastatic carcinoma. Due to prior surgery, tumor size difficult to measure, but at least T3.      11/03/2013 Echocardiogram    Pre-chemo EF normal: 55-60%.       11/28/2013 - 01/09/2014 Adjuvant Chemotherapy    Dose-dense AC x 4 completed.       01/23/2014 - 05/01/2014 Adjuvant Chemotherapy    Weekly Carbo/Taxol x 12. [Cycle #2 had mild reaction to Taxol that resolved with treatment; Cycle #7 held d/t neutropenia, but received 1 week later].  Completed all 12 cycles.       06/20/2014 - 07/28/2014 Radiation Therapy    Right chest wall and IM lymph nodes: total dose of 50.4 Gy over 28 fractions.  Right supraclavicular fossa and right axilla: total dose of 45 Gy over 25 fractions.       07/31/2014 Imaging    Restaging CT c/a/p negative for distant mets.  Also bone scan negative for skeletal mets.       01/02/2015 Imaging    MM DIAG BREAST TOMO UNI LEFT  AND US BREAST LTD UNI LEFT INC AXILLA IMPRESSION: Benign cyst in dilated retroareolar ducts in the left breast. Small calcified fibroadenoma. No evidence of malignancy. BI-RADS CATEGORY  2: Benign.      12/06/2015 Imaging    MM Digital Screening Unilat L IMPRESSION: No mammographic evidence of malignancy. A result letter of this screening mammogram will be mailed directly to the patient.  RECOMMENDATION: Screening mammogram in one year. (Code:SM-B-01Y)      CURRENT THERAPY: Observation  INTERVAL HISTORY: She returns for follow up. She states things are going well. Denies any chest pain. Reports a cramp in her right leg for the past 2 days. Denies taking calcium tablets. The patient sees White, NP for her blood sugar.  REVIEW OF SYSTEMS:   Constitutional: Denies fevers, chills or abnormal weight loss Eyes: Denies blurriness of vision Ears, nose, mouth, throat, and face: Denies mucositis or sore throat Respiratory: Denies cough, dyspnea or wheezes Cardiovascular: Denies palpitation, chest discomfort or lower extremity swelling Gastrointestinal:  Denies nausea, heartburn or change in bowel habits Skin: Denies abnormal skin rashes Lymphatics: Denies new lymphadenopathy or easy bruising Neurological:Denies numbness, tingling or new weaknesses Behavioral/Psych: Mood is stable, no new changes  Musculoskeletal: (+) right leg cramps All other systems were reviewed with the patient and are negative.  MEDICAL HISTORY:  Past Medical History:  Diagnosis Date  . Diabetes mellitus, type II ([place])   .  Hyperlipemia   . Hypertension   . 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 Radiation 06/20/14-07/28/14    SURGICAL HISTORY: Past Surgical History:  Procedure Laterality Date  . BREAST BIOPSY Right 10/22/2013   Procedure: BREAST BIOPSY;  Surgeon: [name], MD;  Location: [facility];  Service: General;  Laterality: Right;  . CESAREAN SECTION     x 1  . CHOLECYSTECTOMY  2011  .  DRESSING CHANGE UNDER ANESTHESIA Right 10/26/2013   Procedure: COMPLETION MASTECTOMY, AXILLARY DISSECTION;  Surgeon: [name], MD;  Location: [facility];  Service: General;  Laterality: Right;  . INCISION AND DRAINAGE OF WOUND Right 10/22/2013   Procedure: IRRIGATION AND DRAINAGE AND DEBRIDEMENT RIGHT BREAST WOUND;  Surgeon: [name], MD;  Location: [facility];  Service: General;  Laterality: Right;  . IRRIGATION AND DEBRIDEMENT ABSCESS Right 10/24/2013   Procedure: Right total mastectomy;  Surgeon: [name], MD;  Location: [facility];  Service: General;  Laterality: Right;  . PORTACATH PLACEMENT N/A 11/16/2013   Procedure: INSERTION PORT-A-CATH;  Surgeon: [name], MD;  Location: [facility];  Service: General;  Laterality: N/A;  . VENTRAL HERNIA REPAIR  2011   repair incacerated VH with biologic mesh; cholecystectomy    I have reviewed the social history and family history with the patient and they are unchanged from previous note.  ALLERGIES:  has No Known Allergies.  MEDICATIONS:  Current Outpatient Prescriptions  Medication Sig Dispense Refill  . acetaminophen (TYLENOL) 325 MG tablet Take 650 mg by mouth every 6 (six) hours as needed for mild pain.    Marland Kitchen emollient (BIAFINE) cream Apply 90 g topically 2 (two) times daily.    Marland Kitchen glucose blood test strip Use as instructed 100 each 12  . glucose monitoring kit (FREESTYLE) monitoring kit 1 each by Does not apply route as needed for other. Check blood sugars daily. 1 each 0  . Insulin Glargine (LANTUS SOLOSTAR) 100 UNIT/ML Solostar Pen Inject 10 Units into the skin daily at 10 pm. 15 mL 3  . latanoprost (XALATAN) 0.005 % ophthalmic solution Place 1 drop into both eyes 2 (two) times daily.  0  . lidocaine-prilocaine (EMLA) cream Apply 1 application topically as needed. 30 g 0  . lisinopril (PRINIVIL,ZESTRIL) 10 MG tablet Take 10 mg by mouth daily.   0  . LORazepam (ATIVAN) 0.5 MG tablet Take 1 tablet (0.5 mg total) by  mouth at bedtime as needed (Nausea or vomiting). 30 tablet 0  . metFORMIN (GLUCOPHAGE) 500 MG tablet Take 1,000 mg by mouth daily.  0  . ondansetron (ZOFRAN) 8 MG tablet Take 1 tablet (8 mg total) by mouth every 8 (eight) hours as needed for nausea or vomiting. 20 tablet 3  . pravastatin (PRAVACHOL) 10 MG tablet Take 10 mg by mouth daily.   0  . timolol (TIMOPTIC) 0.5 % ophthalmic solution instill 1 drop into both eyes every morning  0  . traMADol (ULTRAM) 50 MG tablet take 1 tablet by mouth every 6 hours if needed for pain 60 tablet 1  . loperamide (IMODIUM) 2 MG capsule Take 2 mg by mouth as needed for diarrhea or loose stools.     No current facility-administered medications for this visit.     PHYSICAL EXAMINATION: ECOG PERFORMANCE STATUS: 0 - Asymptomatic  Vitals:   05/29/16 1246  BP: (!) 103/49  Pulse: 91  Resp: 17  Temp: 98.6 F (37 C)   Filed Weights   05/29/16 1246  Weight: 167 lb 4.8 oz (75.9 kg)    GENERAL:alert, no distress and comfortable SKIN: skin color, texture, turgor are normal, no rashes.  EYES: normal, Conjunctiva are pink and non-injected, sclera clear OROPHARYNX:no exudate, no erythema and lips, buccal mucosa, and tongue normal  NECK: supple, thyroid normal size, non-tender, without nodularity LYMPH:  no palpable lymphadenopathy in the cervical, axillary or inguinal LUNGS: clear to auscultation and percussion with normal breathing effort HEART: regular rate & rhythm and no murmurs and no lower extremity edema ABDOMEN:abdomen soft, non-tender and normal bowel sounds Musculoskeletal:no cyanosis of digits and no clubbing  NEURO: alert & oriented x 3 with fluent speech, no focal motor/sensory deficits Breast: Right side mastectomy. No nodularity. Left breast exam negative.  LABORATORY DATA:  I have reviewed the data as listed CBC Latest Ref Rng & Units 05/29/2016 01/31/2016 10/04/2015  WBC 3.9 - 10.3 10e3/uL 9.7 9.0 7.5  Hemoglobin 11.6 - 15.9 g/dL 12.5  12.0 12.1  Hematocrit 34.8 - 46.6 % 37.5 36.3 36.6  Platelets 145 - 400 10e3/uL 246 268 241     CMP Latest Ref Rng & Units 05/29/2016 01/31/2016 10/04/2015  Glucose 70 - 140 mg/dl 483(H) 335(H) 307(H)  BUN 7.0 - 26.0 mg/dL 13.0 14.4 11.9  Creatinine 0.6 - 1.1 mg/dL 1.0 0.9 0.9  Sodium 136 - 145 mEq/L 136 138 137  Potassium 3.5 - 5.1 mEq/L 4.2 4.1 3.9  Chloride 96 - 112 mEq/L - - -  CO2 22 - 29 mEq/L '23 24 26  ' Calcium 8.4 - 10.4 mg/dL 9.1 9.6 8.7  Total Protein 6.4 - 8.3 g/dL 7.0 6.6 6.3(L)  Total Bilirubin 0.20 - 1.20 mg/dL 0.31 0.39 <0.30  Alkaline Phos 40 - 150 U/L 131 109 101  AST 5 - 34 U/L '15 13 12  ' ALT 0 - 55 U/L '16 14 10    ' RADIOGRAPHIC STUDIES: I have personally reviewed the radiological images as listed and agreed with the findings in the report.  MM Digital Screening Unilat L 12/06/2015 IMPRESSION: No mammographic evidence of malignancy. A result letter of this screening mammogram will be mailed directly to the patient.  RECOMMENDATION: Screening mammogram in one year. (Code:SM-B-01Y)  ASSESSMENT & PLAN: 59 y.o. female with past medical history of diabetes, presented with a right inflammatory breast cancer, status post right modified radical mastectomy 10/26/2013 for a cT4, pT3 pN1, stage IIIB invasive ductal carcinoma, grade 3, triple negative.  1. 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  -We discussed the high risk for cancer recurrence, especially in the first 2-3 years. I recommend closer follow-up every 3-4 months for the first 2-3 years, then every 6-12 months for a total 5 years. -there is no role for adjuvant endocrine therapy. -It has been over 2 years since her diagnosis, her risk of recurrence has certainly decreased. -lab results reviewed with her, CBC and CMP unremarkable except hyperglycemia. -she is clinically doing well overall, her last mammogram on 12/06/15 was normal. No evidence of recurrence. -We'll continue  cancer surveillance -I encouraged her to continue healthy diet and exercise regularly.  2. Diabetes, type 2, poorly controlled  -not well controlled She will follow-up with her primary physician. I Strongly encouraged her to call her primary care physician in the next few days.  3. Muscular cramps -The patient has cramps, I advised her to take a calcium tablet.  PLAN -I'll see her back in 6 months with lab. - I will contact Dr. [name] to remove her  port. We will flush it today. -I advised the patient to see her PCP sooner regarding her blood sugar since it is close to 500 today.  All questions were answered. The patient knows to call the clinic with any problems, questions or concerns. No barriers to learning was detected.  I spent 15 minutes counseling the patient face to face. The total time spent in the appointment was 20 minutes and more than 50% was on counseling and review of test results     Truitt Merle, MD 05/29/16   This document serves as a record of services personally performed by Truitt Merle, MD. It was created on her behalf by Darcus Austin, a trained medical scribe. The creation of this record is based on the scribe's personal observations and the provider's statements to them. This document has been checked and approved by the attending provider.

## 2016-05-29 NOTE — Telephone Encounter (Signed)
Appointments scheduled per 05/29/16 los. Patient was given a copy of the AVS report and appointment schedule, per 05/29/16 los.

## 2016-05-30 LAB — CANCER ANTIGEN 27.29: CA 27.29: 8.4 U/mL (ref 0.0–38.6)

## 2016-06-11 ENCOUNTER — Other Ambulatory Visit: Payer: Self-pay | Admitting: Nurse Practitioner

## 2016-07-24 ENCOUNTER — Other Ambulatory Visit: Payer: Self-pay | Admitting: *Deleted

## 2016-07-24 MED ORDER — TRAMADOL HCL 50 MG PO TABS: ORAL_TABLET | ORAL | 0 refills | Status: DC

## 2016-08-26 ENCOUNTER — Other Ambulatory Visit: Payer: Self-pay | Admitting: Hematology

## 2016-08-27 ENCOUNTER — Telehealth: Payer: Self-pay | Admitting: *Deleted

## 2016-08-27 NOTE — Telephone Encounter (Signed)
Received refill request from pt's pharmacy for Tramadol.  Last script was on 07/24/16.  Spoke with pt and was informed that pt has 10 tablets left.  Stated she takes pain med mostly for her abdominal pain.  Pt to schedule for abdominal hernia surgery soon.  Stated occasional discomfort  in arm same site of breast surgery.  Stated she just takes pain med just in case the pain occurs.   Instructed pt to notify her PCP for future refill of Tramadol for abdominal surgery pain.   Pt voiced understanding.  Stated she is still waiting for her family to provide PCP name for pt to call.

## 2016-09-03 ENCOUNTER — Telehealth: Payer: Self-pay | Admitting: *Deleted

## 2016-09-03 NOTE — Telephone Encounter (Signed)
Received refill request forl tramadol from pt & Dr Burr Medico asked why pt was taking.  Called pt & she states she was taking for her R arm pain.  Asked if she had tried tylenol or advil or aleve.  She is willing to try the tylenol or aleve & will left [place] know how it works.  Tramadol not refilled.

## 2016-11-10 ENCOUNTER — Other Ambulatory Visit: Payer: Self-pay | Admitting: Hematology

## 2016-11-19 NOTE — Progress Notes (Signed)
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       10/24/2013 Surgery    Right total mastectomy of necrotizing breast tissue. Path revealed high grade carcinoma which extends into skin with involvement of dermal lymphatics.       10/25/2013 Imaging    CT C/A/P negative for distant metastases.      10/26/2013 Surgery    Completion right mastectomy & right axillary LN dissection: Surgical path showed high grade IDC, extending into skin. (+) LVI.  2/16 nodes positive for metastatic carcinoma. Due to prior surgery, tumor size difficult to measure, but at least T3.      11/03/2013 Echocardiogram    Pre-chemo EF normal: 55-60%.       11/28/2013 - 01/09/2014 Adjuvant Chemotherapy    Dose-dense AC x 4 completed.       01/23/2014 - 05/01/2014 Adjuvant Chemotherapy    Weekly Carbo/Taxol x 12. [Cycle #2 had mild reaction to Taxol that resolved with treatment; Cycle #7 held d/t neutropenia, but received 1 week later].  Completed all 12 cycles.       06/20/2014 - 07/28/2014 Radiation Therapy    Right chest wall and IM lymph nodes: total dose of 50.4 Gy over 28 fractions.  Right supraclavicular fossa and right axilla: total dose of 45 Gy over 25 fractions.       07/31/2014 Imaging    Restaging CT c/a/p negative for distant mets.  Also bone scan negative for skeletal mets.       01/02/2015 Imaging    MM DIAG  BREAST TOMO UNI LEFT AND US BREAST LTD UNI LEFT INC AXILLA IMPRESSION: Benign cyst in dilated retroareolar ducts in the left breast. Small calcified fibroadenoma. No evidence of malignancy. BI-RADS CATEGORY  2: Benign.      12/06/2015 Imaging    MM Digital Screening Unilat L IMPRESSION: No mammographic evidence of malignancy. A result letter of this screening mammogram will be mailed directly to the patient.  RECOMMENDATION: Screening mammogram in one year. (Code:SM-B-01Y)      CURRENT THERAPY: Observation   INTERVAL HISTORY:  She returns for follow up. She presents to the clinic today reporting doing well. For the last 2 months she has some left arm and right leg pain. Her tylenol for her cramps in her leg and arm is not working. She would like to use Tramadol. She has dry skin on her hands.  She has not had a bone density scan in 3 years at the breast center. She remembers it was normal.   She would have to schedule with Dr. Wakefield.  She has her mammogram scheduled for July.   REVIEW OF SYSTEMS:   Constitutional: Denies fevers, chills or abnormal weight loss Eyes: Denies blurriness of vision Ears, nose, mouth, throat, and face: Denies mucositis or sore throat Respiratory: Denies cough, dyspnea or wheezes Cardiovascular: Denies palpitation, chest discomfort or lower extremity swelling Gastrointestinal:  Denies nausea, heartburn or change in bowel habits Skin: Denies abnormal skin   rashes (+) dry skin on hands Lymphatics: Denies new lymphadenopathy or easy bruising Neurological:Denies numbness, tingling or new weaknesses Behavioral/Psych: Mood is stable, no new changes  Musculoskeletal: (+) right leg cramps (+) right arm pain All other systems were reviewed with the patient and are negative.  MEDICAL HISTORY:  Past Medical History:  Diagnosis Date  . Diabetes mellitus, type II ([place])   . Hyperlipemia   . Hypertension   . Inflammatory breast cancer ([place]) 10/22/13    right  . Radiation 06/20/14-07/28/14    SURGICAL HISTORY: Past Surgical History:  Procedure Laterality Date  . BREAST BIOPSY Right 10/22/2013   Procedure: BREAST BIOPSY;  Surgeon: [name], MD;  Location: [facility];  Service: General;  Laterality: Right;  . CESAREAN SECTION     x 1  . CHOLECYSTECTOMY  2011  . DRESSING CHANGE UNDER ANESTHESIA Right 10/26/2013   Procedure: COMPLETION MASTECTOMY, AXILLARY DISSECTION;  Surgeon: [name], MD;  Location: [facility];  Service: General;  Laterality: Right;  . INCISION AND DRAINAGE OF WOUND Right 10/22/2013   Procedure: IRRIGATION AND DRAINAGE AND DEBRIDEMENT RIGHT BREAST WOUND;  Surgeon: [name], MD;  Location: [facility];  Service: General;  Laterality: Right;  . IRRIGATION AND DEBRIDEMENT ABSCESS Right 10/24/2013   Procedure: Right total mastectomy;  Surgeon: [name], MD;  Location: [facility];  Service: General;  Laterality: Right;  . PORTACATH PLACEMENT N/A 11/16/2013   Procedure: INSERTION PORT-A-CATH;  Surgeon: [name], MD;  Location: [facility];  Service: General;  Laterality: N/A;  . VENTRAL HERNIA REPAIR  2011   repair incacerated VH with biologic mesh; cholecystectomy    I have reviewed the social history and family history with the patient and they are unchanged from previous note.  ALLERGIES:  has No Known Allergies.  MEDICATIONS:  Current Outpatient Prescriptions  Medication Sig Dispense Refill  . acetaminophen (TYLENOL) 325 MG tablet Take 650 mg by mouth every 6 (six) hours as needed for mild pain.    . dorzolamide-timolol (COSOPT) 22.3-6.8 MG/ML ophthalmic solution instill 1 drop into both eyes twice a day  1  . glucose blood test strip Use as instructed 100 each 12  . glucose monitoring kit (FREESTYLE) monitoring kit 1 each by Does not apply route as needed for other. Check blood sugars daily. 1 each 0  . Insulin Glargine (LANTUS SOLOSTAR) 100 UNIT/ML Solostar Pen Inject 10 Units into the skin daily  at 10 pm. 15 mL 3  . latanoprost (XALATAN) 0.005 % ophthalmic solution Place 1 drop into both eyes 2 (two) times daily.  0  . lidocaine-prilocaine (EMLA) cream Apply 1 application topically as needed. 30 g 0  . lisinopril (PRINIVIL,ZESTRIL) 10 MG tablet Take 10 mg by mouth daily.   0  . loperamide (IMODIUM) 2 MG capsule Take 2 mg by mouth as needed for diarrhea or loose stools.    Marland Kitchen LORazepam (ATIVAN) 0.5 MG tablet Take 1 tablet (0.5 mg total) by mouth at bedtime as needed (Nausea or vomiting). 30 tablet 0  . metFORMIN (GLUCOPHAGE) 500 MG tablet Take 1,000 mg by mouth daily.  0  . ondansetron (ZOFRAN) 8 MG tablet Take 1 tablet (8 mg total) by mouth every 8 (eight) hours as needed for nausea or vomiting. 20 tablet 3  . pravastatin (PRAVACHOL) 10 MG tablet Take 10 mg by mouth daily.   0  . timolol (TIMOPTIC) 0.5 % ophthalmic solution instill 1 drop into both eyes every morning  0  .  traMADol (ULTRAM) 50 MG tablet take 1 tablet by mouth every 6 hours if needed for pain 60 tablet 0  . emollient (BIAFINE) cream Apply 90 g topically 2 (two) times daily.     No current facility-administered medications for this visit.     PHYSICAL EXAMINATION: ECOG PERFORMANCE STATUS: 0 - Asymptomatic  Vitals:   11/27/16 1024  BP: 116/64  Pulse: 88  Resp: 18  Temp: 98.4 F (36.9 C)   Filed Weights   11/27/16 1024  Weight: 159 lb 11.2 oz (72.4 kg)    GENERAL:alert, no distress and comfortable SKIN: skin color, texture, turgor are normal, no rashes.  EYES: normal, Conjunctiva are pink and non-injected, sclera clear OROPHARYNX:no exudate, no erythema and lips, buccal mucosa, and tongue normal  NECK: supple, thyroid normal size, non-tender, without nodularity LYMPH:  no palpable lymphadenopathy in the cervical, axillary or inguinal LUNGS: clear to auscultation and percussion with normal breathing effort HEART: regular rate & rhythm and no murmurs and no lower extremity edema ABDOMEN:abdomen soft,  non-tender and normal bowel sounds (+) large hernia in mid upper abdomen  Musculoskeletal:no cyanosis of digits and no clubbing  NEURO: alert & oriented x 3 with fluent speech, no focal motor/sensory deficits Breast: Right side mastectomy. No nodularity. Left breast exam negative. (+) possible stitch in right mastectomy incision   LABORATORY DATA:  I have reviewed the data as listed CBC Latest Ref Rng & Units 11/27/2016 05/29/2016 01/31/2016  WBC 3.9 - 10.3 10e3/uL 7.9 9.7 9.0  Hemoglobin 11.6 - 15.9 g/dL 12.2 12.5 12.0  Hematocrit 34.8 - 46.6 % 35.9 37.5 36.3  Platelets 145 - 400 10e3/uL 251 246 268     CMP Latest Ref Rng & Units 11/27/2016 05/29/2016 01/31/2016  Glucose 70 - 140 mg/dl 550 Repeated and Verified(H) 483(H) 335(H)  BUN 7.0 - 26.0 mg/dL 13.0 13.0 14.4  Creatinine 0.6 - 1.1 mg/dL 1.1 1.0 0.9  Sodium 136 - 145 mEq/L 135(L) 136 138  Potassium 3.5 - 5.1 mEq/L 4.3 4.2 4.1  Chloride 96 - 112 mEq/L - - -  CO2 22 - 29 mEq/L _0 Calcium 8.4 - 10.4 mg/dL 9.2 9.1 9.6  Total Protein 6.4 - 8.3 g/dL 6.7 7.0 6.6  Total Bilirubin 0.20 - 1.20 mg/dL 0.31 0.31 0.39  Alkaline Phos 40 - 150 U/L 166(H) 131 109  AST 5 - 34 U/L _1 ALT 0 - 55 U/L _2 RADIOGRAPHIC STUDIES: I have personally reviewed the radiological images as listed and agreed with the findings in the report.  MM Digital Screening Unilat L 12/06/2015 IMPRESSION: No mammographic evidence of malignancy. A result letter of this screening mammogram will be mailed directly to the patient.  RECOMMENDATION: Screening mammogram in one year. (Code:SM-B-01Y)  ASSESSMENT & PLAN: 61 y.o. female with past medical history of diabetes, presented with a right inflammatory breast cancer, status post right modified radical mastectomy 10/26/2013 for a cT4, pT3 pN1, stage IIIB invasive ductal carcinoma, grade 3, triple negative.  1. 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  Doing well, lab reviewed, exam was unremarkable, no clinical concern for recurrence. -It has been over 3 years since her diagnosis, her risk of recurrence has significantly decreased. -her last mammogram on 12/06/15 was normal. No evidence of recurrence. -We'll continue cancer surveillance -I previously encouraged her to continue healthy diet and exercise regularly. -Her next mammogram is 12/2016 -Based on Breast Exam today she  appears to have stitches remaining from surgery. I encouraged her to bring this up to Dr. [name] at their next visit.    2. Diabetes, type 2, poorly controlled  -her diabetes is poorly controlled She will follow-up with her primary physician. I previously encouraged her to call her primary care physician soon, but did not get f/u -sugar 550 today 11/27/16. Pt says she uses lantis 10 units. I suggest her to go up to 20 units.  I will contact PCP about her sugar and medication.  -I also encouraged her to drink plenty of water and eat no desserts. -I have faxed her lab result to her PCP and my nurse also call them   3. Muscular cramps -The patient has cramps, I previously advised her to take a calcium tablet. - I will prescribe tramadol but for twice a day max due to pain.    PLAN -refillTramadol today  -next mammogram 12/2016 -I'll see her back in 6 months with lab. -Pt will f/u with Dr. [name] about possible stitches left from surgery. -Will contact PCP about Pt high sugar levels    All questions were answered. The patient knows to call the clinic with any problems, questions or concerns. No barriers to learning was detected.  I spent 20 minutes counseling the patient face to face. The total time spent in the appointment was 30 minutes and more than 50% was on counseling and review of test results     Truitt Merle, MD 11/27/2016   This document serves as a record of services personally performed by Truitt Merle, MD. It was created on her behalf by Joslyn Devon,  a trained medical scribe. The creation of this record is based on the scribe's personal observations and the provider's statements to them. This document has been checked and approved by the attending provider.

## 2016-11-26 ENCOUNTER — Other Ambulatory Visit: Payer: Self-pay | Admitting: Hematology

## 2016-11-26 DIAGNOSIS — Z1231 Encounter for screening mammogram for malignant neoplasm of breast: Secondary | ICD-10-CM

## 2016-11-27 ENCOUNTER — Telehealth: Payer: Self-pay | Admitting: *Deleted

## 2016-11-27 ENCOUNTER — Encounter: Payer: Self-pay | Admitting: Hematology

## 2016-11-27 ENCOUNTER — Other Ambulatory Visit (HOSPITAL_BASED_OUTPATIENT_CLINIC_OR_DEPARTMENT_OTHER): Payer: BLUE CROSS/BLUE SHIELD

## 2016-11-27 ENCOUNTER — Ambulatory Visit (HOSPITAL_BASED_OUTPATIENT_CLINIC_OR_DEPARTMENT_OTHER): Payer: BLUE CROSS/BLUE SHIELD | Admitting: Hematology

## 2016-11-27 ENCOUNTER — Telehealth: Payer: Self-pay | Admitting: Hematology

## 2016-11-27 VITALS — BP 116/64 | HR 88 | Temp 98.4°F | Resp 18 | Ht 64.0 in | Wt 159.7 lb

## 2016-11-27 DIAGNOSIS — Z853 Personal history of malignant neoplasm of breast: Secondary | ICD-10-CM

## 2016-11-27 DIAGNOSIS — E1165 Type 2 diabetes mellitus with hyperglycemia: Secondary | ICD-10-CM | POA: Diagnosis not present

## 2016-11-27 DIAGNOSIS — Z794 Long term (current) use of insulin: Secondary | ICD-10-CM | POA: Diagnosis not present

## 2016-11-27 DIAGNOSIS — R252 Cramp and spasm: Secondary | ICD-10-CM | POA: Diagnosis not present

## 2016-11-27 DIAGNOSIS — C50911 Malignant neoplasm of unspecified site of right female breast: Secondary | ICD-10-CM

## 2016-11-27 DIAGNOSIS — E118 Type 2 diabetes mellitus with unspecified complications: Secondary | ICD-10-CM

## 2016-11-27 LAB — COMPREHENSIVE METABOLIC PANEL
ALBUMIN: 3.3 g/dL — AB (ref 3.5–5.0)
ALK PHOS: 166 U/L — AB (ref 40–150)
ALT: 18 U/L (ref 0–55)
AST: 16 U/L (ref 5–34)
Anion Gap: 9 mEq/L (ref 3–11)
BUN: 13 mg/dL (ref 7.0–26.0)
CO2: 27 meq/L (ref 22–29)
Calcium: 9.2 mg/dL (ref 8.4–10.4)
Chloride: 100 mEq/L (ref 98–109)
Creatinine: 1.1 mg/dL (ref 0.6–1.1)
EGFR: 63 mL/min/{1.73_m2} — ABNORMAL LOW (ref >90)
POTASSIUM: 4.3 meq/L (ref 3.5–5.1)
SODIUM: 135 meq/L — AB (ref 136–145)
TOTAL PROTEIN: 6.7 g/dL (ref 6.4–8.3)
Total Bilirubin: 0.31 mg/dL (ref 0.20–1.20)

## 2016-11-27 LAB — CBC WITH DIFFERENTIAL/PLATELET
BASO%: 0.6 % (ref 0.0–2.0)
Basophils Absolute: 0.1 10*3/uL (ref 0.0–0.1)
EOS ABS: 0.1 10*3/uL (ref 0.0–0.5)
EOS%: 1.8 % (ref 0.0–7.0)
HCT: 35.9 % (ref 34.8–46.6)
HEMOGLOBIN: 12.2 g/dL (ref 11.6–15.9)
LYMPH%: 18.9 % (ref 14.0–49.7)
MCH: 30.7 pg (ref 25.1–34.0)
MCHC: 34 g/dL (ref 31.5–36.0)
MCV: 90.4 fL (ref 79.5–101.0)
MONO#: 0.7 10*3/uL (ref 0.1–0.9)
MONO%: 9 % (ref 0.0–14.0)
NEUT%: 69.7 % (ref 38.4–76.8)
NEUTROS ABS: 5.5 10*3/uL (ref 1.5–6.5)
Platelets: 251 10*3/uL (ref 145–400)
RBC: 3.97 10*6/uL (ref 3.70–5.45)
RDW: 12.7 % (ref 11.2–14.5)
WBC: 7.9 10*3/uL (ref 3.9–10.3)
lymph#: 1.5 10*3/uL (ref 0.9–3.3)

## 2016-11-27 MED ORDER — TRAMADOL HCL 50 MG PO TABS: ORAL_TABLET | ORAL | 0 refills | Status: DC

## 2016-11-27 NOTE — Telephone Encounter (Signed)
Scheduled appt pe r6/14 los - Gave patient AVS and calender per LOS - lab and f/u in 6 months.

## 2016-11-27 NOTE — Telephone Encounter (Signed)
Notified Jettie Booze NP office that pt's glucose elevated at 550 & Dr Burr Medico thinks PCP needs to see pt.  Labs also routed to that office.

## 2016-11-28 LAB — CANCER ANTIGEN 27.29: CAN 27.29: 7.5 U/mL (ref 0.0–38.6)

## 2016-11-29 ENCOUNTER — Encounter: Payer: Self-pay | Admitting: Hematology

## 2016-12-30 ENCOUNTER — Ambulatory Visit: Payer: BLUE CROSS/BLUE SHIELD

## 2017-01-04 ENCOUNTER — Other Ambulatory Visit: Payer: Self-pay | Admitting: Hematology

## 2017-01-15 ENCOUNTER — Encounter: Payer: Self-pay | Admitting: Genetic Counselor

## 2017-01-30 ENCOUNTER — Ambulatory Visit
Admission: RE | Admit: 2017-01-30 | Discharge: 2017-01-30 | Disposition: A | Discharge status: Home / Self Care | Payer: BLUE CROSS/BLUE SHIELD | Source: Ambulatory Visit | Attending: Hematology | Admitting: Hematology

## 2017-01-30 DIAGNOSIS — Z1231 Encounter for screening mammogram for malignant neoplasm of breast: Secondary | ICD-10-CM

## 2017-02-26 ENCOUNTER — Other Ambulatory Visit: Payer: Self-pay | Admitting: Hematology

## 2017-02-26 DIAGNOSIS — C50911 Malignant neoplasm of unspecified site of right female breast: Secondary | ICD-10-CM

## 2017-04-01 ENCOUNTER — Other Ambulatory Visit: Payer: Self-pay | Admitting: Hematology

## 2017-04-01 DIAGNOSIS — C50911 Malignant neoplasm of unspecified site of right female breast: Secondary | ICD-10-CM

## 2017-04-08 ENCOUNTER — Other Ambulatory Visit: Payer: Self-pay

## 2017-04-09 ENCOUNTER — Other Ambulatory Visit: Payer: Self-pay

## 2017-04-09 DIAGNOSIS — C50911 Malignant neoplasm of unspecified site of right female breast: Secondary | ICD-10-CM

## 2017-04-09 MED ORDER — TRAMADOL HCL 50 MG PO TABS: 50.0000 mg | ORAL_TABLET | Freq: Two times a day (BID) | ORAL | 0 refills | Status: DC | PRN

## 2017-05-13 ENCOUNTER — Other Ambulatory Visit: Payer: Self-pay | Admitting: *Deleted

## 2017-05-13 DIAGNOSIS — C50911 Malignant neoplasm of unspecified site of right female breast: Secondary | ICD-10-CM

## 2017-05-13 MED ORDER — TRAMADOL HCL 50 MG PO TABS: 50.0000 mg | ORAL_TABLET | Freq: Two times a day (BID) | ORAL | 0 refills | Status: DC | PRN

## 2017-05-16 ENCOUNTER — Telehealth: Payer: Self-pay | Admitting: Hematology

## 2017-05-16 NOTE — Telephone Encounter (Signed)
Left message for patient regarding upcoming December appointment updates per provider request.

## 2017-05-26 ENCOUNTER — Ambulatory Visit: Payer: BLUE CROSS/BLUE SHIELD | Admitting: Hematology

## 2017-05-26 ENCOUNTER — Other Ambulatory Visit: Payer: BLUE CROSS/BLUE SHIELD

## 2017-05-28 NOTE — Progress Notes (Signed)
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       10/24/2013 Surgery    Right total mastectomy of necrotizing breast tissue. Path revealed high grade carcinoma which extends into skin with involvement of dermal lymphatics.       10/25/2013 Imaging    CT C/A/P negative for distant metastases.      10/26/2013 Surgery    Completion right mastectomy & right axillary LN dissection: Surgical path showed high grade IDC, extending into skin. (+) LVI.  2/16 nodes positive for metastatic carcinoma. Due to prior surgery, tumor size difficult to measure, but at least T3.      11/03/2013 Echocardiogram    Pre-chemo EF normal: 55-60%.       11/28/2013 - 01/09/2014 Adjuvant Chemotherapy    Dose-dense AC x 4 completed.       01/23/2014 - 05/01/2014 Adjuvant Chemotherapy    Weekly Carbo/Taxol x 12. [Cycle #2 had mild reaction to Taxol that resolved with treatment; Cycle #7 held d/t neutropenia, but received 1 week later].  Completed all 12 cycles.       06/20/2014 - 07/28/2014 Radiation Therapy    Right chest wall and IM lymph nodes: total dose of 50.4 Gy over 28 fractions.  Right supraclavicular fossa and right axilla: total dose of 45 Gy over 25 fractions.       07/31/2014 Imaging    Restaging CT c/a/p negative for distant mets.  Also bone scan negative for skeletal mets.        01/02/2015 Imaging    MM DIAG BREAST TOMO UNI LEFT AND US BREAST LTD UNI LEFT INC AXILLA IMPRESSION: Benign cyst in dilated retroareolar ducts in the left breast. Small calcified fibroadenoma. No evidence of malignancy. BI-RADS CATEGORY  2: Benign.      12/06/2015 Imaging    MM Digital Screening Unilat L IMPRESSION: No mammographic evidence of malignancy. A result letter of this screening mammogram will be mailed directly to the patient.  RECOMMENDATION: Screening mammogram in one year. (Code:SM-B-01Y)      CURRENT THERAPY: Surveillance    INTERVAL HISTORY:  She returns for follow up. She was last seen by me 6 months ago. She presents to the clinic today noting she had a previous stitches removed. She notes she scratched her skin and saw a white skin tag on her right upper breast. She notes upper right arm pain and will become week. Her hands don't normally tingle but her right hand has. She did PT after her surgery. She does stretch bands. She denies any new pain or concerns. She still has her port, she will wait until her insurance changes at the first of the year so she does not have to pay much. She takes tramadol once every 2-3 days. She no longer takes imodium or Zofran.    REVIEW OF SYSTEMS:   Constitutional: Denies fevers, chills or abnormal weight loss Eyes: Denies blurriness of vision Ears, nose,  mouth, throat, and face: Denies mucositis or sore throat Respiratory: Denies cough, dyspnea or wheezes Cardiovascular: Denies palpitation, chest discomfort or lower extremity swelling Gastrointestinal:  Denies nausea, heartburn or change in bowel habits (+) abdominal hernia Skin: Denies abnormal skin rashes (+) skin tag on upper right chest Lymphatics: Denies new lymphadenopathy or easy bruising Neurological:Denies numbness, tingling or new weaknesses Behavioral/Psych: Mood is stable, no new changes  Musculoskeletal: (+) right leg cramps (+) right arm pain All other systems  were reviewed with the patient and are negative.  MEDICAL HISTORY:  Past Medical History:  Diagnosis Date  . Diabetes mellitus, type II ([place])   . Hyperlipemia   . Hypertension   . Inflammatory breast cancer ([place]) 10/22/13   right  . Radiation 06/20/14-07/28/14    SURGICAL HISTORY: Past Surgical History:  Procedure Laterality Date  . BREAST BIOPSY Right 10/22/2013   Procedure: BREAST BIOPSY;  Surgeon: [name], MD;  Location: [facility];  Service: General;  Laterality: Right;  . CESAREAN SECTION     x 1  . CHOLECYSTECTOMY  2011  . DRESSING CHANGE UNDER ANESTHESIA Right 10/26/2013   Procedure: COMPLETION MASTECTOMY, AXILLARY DISSECTION;  Surgeon: [name], MD;  Location: [facility];  Service: General;  Laterality: Right;  . INCISION AND DRAINAGE OF WOUND Right 10/22/2013   Procedure: IRRIGATION AND DRAINAGE AND DEBRIDEMENT RIGHT BREAST WOUND;  Surgeon: [name], MD;  Location: [facility];  Service: General;  Laterality: Right;  . IRRIGATION AND DEBRIDEMENT ABSCESS Right 10/24/2013   Procedure: Right total mastectomy;  Surgeon: [name], MD;  Location: [facility];  Service: General;  Laterality: Right;  . PORTACATH PLACEMENT N/A 11/16/2013   Procedure: INSERTION PORT-A-CATH;  Surgeon: [name], MD;  Location: [facility];  Service: General;  Laterality: N/A;  . VENTRAL HERNIA REPAIR  2011   repair incacerated VH with biologic mesh; cholecystectomy    I have reviewed the social history and family history with the patient and they are unchanged from previous note.  ALLERGIES:  has No Known Allergies.  MEDICATIONS:  Current Outpatient Medications  Medication Sig Dispense Refill  . acetaminophen (TYLENOL) 325 MG tablet Take 650 mg by mouth every 6 (six) hours as needed for mild pain.    . dorzolamide-timolol (COSOPT) 22.3-6.8 MG/ML ophthalmic solution instill 1 drop into both eyes twice a day  1  . glucose blood test strip Use as instructed 100 each 12  . glucose  monitoring kit (FREESTYLE) monitoring kit 1 each by Does not apply route as needed for other. Check blood sugars daily. 1 each 0  . Insulin Glargine (LANTUS SOLOSTAR) 100 UNIT/ML Solostar Pen Inject 10 Units into the skin daily at 10 pm. 15 mL 3  . latanoprost (XALATAN) 0.005 % ophthalmic solution Place 1 drop into both eyes 2 (two) times daily.  0  . lisinopril (PRINIVIL,ZESTRIL) 10 MG tablet Take 10 mg by mouth daily.   0  . metFORMIN (GLUCOPHAGE) 500 MG tablet Take 1,000 mg by mouth daily.  0  . pravastatin (PRAVACHOL) 10 MG tablet Take 10 mg by mouth daily.   0  . timolol (TIMOPTIC) 0.5 % ophthalmic solution instill 1 drop into both eyes every morning  0  . traMADol (ULTRAM) 50 MG tablet Take 1 tablet (50 mg total) by mouth every 12 (twelve) hours as needed. 60 tablet 0  . lidocaine-prilocaine (EMLA) cream Apply 1 application topically as needed. (Patient not taking: Reported on 06/01/2017) 30 g 0  . LORazepam (  ATIVAN) 0.5 MG tablet Take 1 tablet (0.5 mg total) by mouth at bedtime as needed (Nausea or vomiting). (Patient not taking: Reported on 06/01/2017) 30 tablet 0   No current facility-administered medications for this visit.     PHYSICAL EXAMINATION: ECOG PERFORMANCE STATUS: 0  Vitals:   06/01/17 0955  BP: 123/77  Pulse: 89  Resp: 20  Temp: 98.7 F (37.1 C)  SpO2: 100%   Filed Weights   06/01/17 0955  Weight: 165 lb 12.8 oz (75.2 kg)    GENERAL:alert, no distress and comfortable SKIN: skin color, texture, turgor are normal, no rashes.  (+) a skin tag with scar tissue alone her incision line of right chest EYES: normal, Conjunctiva are pink and non-injected, sclera clear OROPHARYNX:no exudate, no erythema and lips, buccal mucosa, and tongue normal  NECK: supple, thyroid normal size, non-tender, without nodularity LYMPH:  no palpable lymphadenopathy in the cervical, axillary or inguinal LUNGS: clear to auscultation and percussion with normal breathing effort HEART:  regular rate & rhythm and no murmurs and no lower extremity edema ABDOMEN:abdomen soft, non-tender and normal bowel sounds (+) large hernia in mid upper abdomen  Musculoskeletal:no cyanosis of digits and no clubbing  NEURO: alert & oriented x 3 with fluent speech, no focal motor/sensory deficits Breast: Right side mastectomy. No nodularity. Left breast exam negative.    LABORATORY DATA:  I have reviewed the data as listed CBC Latest Ref Rng & Units 06/01/2017 11/27/2016 05/29/2016  WBC 3.9 - 10.3 10e3/uL 8.4 7.9 9.7  Hemoglobin 11.6 - 15.9 g/dL 11.9 12.2 12.5  Hematocrit 34.8 - 46.6 % 35.9 35.9 37.5  Platelets 145 - 400 10e3/uL 247 251 246     CMP Latest Ref Rng & Units 06/01/2017 11/27/2016 05/29/2016  Glucose 70 - 140 mg/dl 118 550 Repeated and Verified(H) 483(H)  BUN 7.0 - 26.0 mg/dL 15.2 13.0 13.0  Creatinine 0.6 - 1.1 mg/dL 0.7 1.1 1.0  Sodium 136 - 145 mEq/L 138 135(L) 136  Potassium 3.5 - 5.1 mEq/L 4.2 4.3 4.2  Chloride 96 - 112 mEq/L - - -  CO2 22 - 29 mEq/L '24 27 23  ' Calcium 8.4 - 10.4 mg/dL 9.0 9.2 9.1  Total Protein 6.4 - 8.3 g/dL 7.0 6.7 7.0  Total Bilirubin 0.20 - 1.20 mg/dL 0.22 0.31 0.31  Alkaline Phos 40 - 150 U/L 97 166(H) 131  AST 5 - 34 U/L '14 16 15  ' ALT 0 - 55 U/L '12 18 16    ' RADIOGRAPHIC STUDIES: I have personally reviewed the radiological images as listed and agreed with the findings in the report.   Screening Mammogram Left 02/02/17  IMPRESSION: No mammographic evidence of malignancy. A result letter of this screening mammogram will be mailed directly to the patient. RECOMMENDATION: Screening mammogram in one year.   MM Digital Screening Unilat L 12/06/2015 IMPRESSION: No mammographic evidence of malignancy. A result letter of this screening mammogram will be mailed directly to the patient.  RECOMMENDATION: Screening mammogram in one year. (Code:SM-B-01Y)  ASSESSMENT & PLAN: 61 y.o. female with past medical history of diabetes, presented with a  right inflammatory breast cancer, status post right modified radical mastectomy 10/26/2013 for a cT4, pT3 pN1, stage IIIB invasive ductal carcinoma, grade 3, triple negative.  1. 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 -We'll continue cancer surveillance -I previously encouraged her to continue healthy diet and exercise regularly. -She is clinically doing  well. Lab reviewed, her CBC and CMP are within normal limits. Her physical exam and her 01/2017 mammogram were unremarkable. There is no clinical concern for recurrence. -I discussed having her hernia repaired by Dr. Marlou Starks. She will consider it. I advised her to not lift anything heavy for now.  -She plans to have port removed after change in insurance in the new year. Will flush port today and every 2 months until removed.  -Continue surveillance, Next mammogram in 01/2018 -F/u in 6 months    2. Diabetes, type 2 -her diabetes is poorly controlled She will follow-up with her primary physician. I previously encouraged her to call her primary care physician soon, but did not get f/u -sugar 550 today 11/27/16. Pt says she uses lantis 10 units. I suggest her to go up to 20 units.  I will contact PCP about her sugar and medication.  -I also encouraged her to drink plenty of water and eat no desserts. -I have faxed her lab result to her PCP and my nurse also call them  -Now better controlled, her BG is 118 today (06/01/17)  3. Muscular cramps and right arm pain  -The patient has cramps, I previously advised her to take a calcium tablet. -She also has intermittent right arm pain, possible related to previous breast surgery - I will prescribe tramadol but for twice a day PRN for pain.  -She is on tramadol that she takes every 2-3 days. Will refill 60 tab every 4 months.    PLAN -Lab and f/u in one year  -Left mammogram in 01/2018  -Port  flush in 2 and 4 months  -Okay to refill tramadol 60 tablets every 4 months   All questions were answered. The patient knows to call the clinic with any problems, questions or concerns. No barriers to learning was detected.  I spent 20 minutes counseling the patient face to face. The total time spent in the appointment was 25 minutes and more than 50% was on counseling and review of test results     Truitt Merle, MD 06/01/2017   This document serves as a record of services personally performed by Truitt Merle, MD. It was created on her behalf by Joslyn Devon, a trained medical scribe. The creation of this record is based on the scribe's personal observations and the provider's statements to them.    I have reviewed the above documentation for accuracy and completeness, and I agree with the above.

## 2017-05-29 ENCOUNTER — Other Ambulatory Visit: Payer: BLUE CROSS/BLUE SHIELD

## 2017-05-29 ENCOUNTER — Ambulatory Visit: Payer: BLUE CROSS/BLUE SHIELD | Admitting: Hematology

## 2017-06-01 ENCOUNTER — Ambulatory Visit (HOSPITAL_BASED_OUTPATIENT_CLINIC_OR_DEPARTMENT_OTHER): Payer: BLUE CROSS/BLUE SHIELD | Admitting: Hematology

## 2017-06-01 ENCOUNTER — Telehealth: Payer: Self-pay | Admitting: Hematology

## 2017-06-01 ENCOUNTER — Other Ambulatory Visit (HOSPITAL_BASED_OUTPATIENT_CLINIC_OR_DEPARTMENT_OTHER): Payer: BLUE CROSS/BLUE SHIELD

## 2017-06-01 ENCOUNTER — Encounter: Payer: Self-pay | Admitting: Hematology

## 2017-06-01 ENCOUNTER — Other Ambulatory Visit: Payer: Self-pay | Admitting: *Deleted

## 2017-06-01 VITALS — BP 123/77 | HR 89 | Temp 98.7°F | Resp 20 | Ht 64.0 in | Wt 165.8 lb

## 2017-06-01 DIAGNOSIS — Z853 Personal history of malignant neoplasm of breast: Secondary | ICD-10-CM | POA: Diagnosis not present

## 2017-06-01 DIAGNOSIS — M79601 Pain in right arm: Secondary | ICD-10-CM | POA: Diagnosis not present

## 2017-06-01 DIAGNOSIS — Z95828 Presence of other vascular implants and grafts: Secondary | ICD-10-CM

## 2017-06-01 DIAGNOSIS — C50911 Malignant neoplasm of unspecified site of right female breast: Secondary | ICD-10-CM

## 2017-06-01 DIAGNOSIS — E1165 Type 2 diabetes mellitus with hyperglycemia: Secondary | ICD-10-CM

## 2017-06-01 DIAGNOSIS — R252 Cramp and spasm: Secondary | ICD-10-CM

## 2017-06-01 DIAGNOSIS — Z794 Long term (current) use of insulin: Secondary | ICD-10-CM

## 2017-06-01 DIAGNOSIS — E118 Type 2 diabetes mellitus with unspecified complications: Secondary | ICD-10-CM

## 2017-06-01 LAB — COMPREHENSIVE METABOLIC PANEL
ALBUMIN: 3.7 g/dL (ref 3.5–5.0)
ALK PHOS: 97 U/L (ref 40–150)
ALT: 12 U/L (ref 0–55)
ANION GAP: 8 meq/L (ref 3–11)
AST: 14 U/L (ref 5–34)
BUN: 15.2 mg/dL (ref 7.0–26.0)
CALCIUM: 9 mg/dL (ref 8.4–10.4)
CHLORIDE: 106 meq/L (ref 98–109)
CO2: 24 mEq/L (ref 22–29)
Creatinine: 0.7 mg/dL (ref 0.6–1.1)
Glucose: 118 mg/dl (ref 70–140)
POTASSIUM: 4.2 meq/L (ref 3.5–5.1)
Sodium: 138 mEq/L (ref 136–145)
Total Bilirubin: 0.22 mg/dL (ref 0.20–1.20)
Total Protein: 7 g/dL (ref 6.4–8.3)

## 2017-06-01 LAB — CBC WITH DIFFERENTIAL/PLATELET
BASO%: 0.5 % (ref 0.0–2.0)
BASOS ABS: 0 10*3/uL (ref 0.0–0.1)
EOS ABS: 0.3 10*3/uL (ref 0.0–0.5)
EOS%: 3.9 % (ref 0.0–7.0)
HEMATOCRIT: 35.9 % (ref 34.8–46.6)
HEMOGLOBIN: 11.9 g/dL (ref 11.6–15.9)
LYMPH#: 2.3 10*3/uL (ref 0.9–3.3)
LYMPH%: 27 % (ref 14.0–49.7)
MCH: 30.5 pg (ref 25.1–34.0)
MCHC: 33.1 g/dL (ref 31.5–36.0)
MCV: 92.1 fL (ref 79.5–101.0)
MONO#: 0.9 10*3/uL (ref 0.1–0.9)
MONO%: 10.8 % (ref 0.0–14.0)
NEUT#: 4.9 10*3/uL (ref 1.5–6.5)
NEUT%: 57.8 % (ref 38.4–76.8)
PLATELETS: 247 10*3/uL (ref 145–400)
RBC: 3.9 10*6/uL (ref 3.70–5.45)
RDW: 13.3 % (ref 11.2–14.5)
WBC: 8.4 10*3/uL (ref 3.9–10.3)

## 2017-06-01 MED ORDER — SODIUM CHLORIDE 0.9% FLUSH
10.0000 mL | INTRAVENOUS | Status: DC | PRN
  Administered 2017-06-01: 10 mL via INTRAVENOUS
  Filled 2017-06-01: qty 10

## 2017-06-01 MED ORDER — HEPARIN SOD (PORK) LOCK FLUSH 100 UNIT/ML IV SOLN
500.0000 [IU] | Freq: Once | INTRAVENOUS | Status: AC
  Administered 2017-06-01: 500 [IU] via INTRAVENOUS
  Filled 2017-06-01: qty 5

## 2017-06-01 NOTE — Telephone Encounter (Signed)
Scheduled appt per 12/17 los - Gave patient AVS and calender per los.

## 2017-06-02 LAB — CANCER ANTIGEN 27.29: CAN 27.29: 10.3 U/mL (ref 0.0–38.6)

## 2017-06-26 ENCOUNTER — Other Ambulatory Visit: Payer: Self-pay | Admitting: *Deleted

## 2017-06-26 DIAGNOSIS — C50911 Malignant neoplasm of unspecified site of right female breast: Secondary | ICD-10-CM

## 2017-06-26 MED ORDER — TRAMADOL HCL 50 MG PO TABS: 50.0000 mg | ORAL_TABLET | Freq: Every day | ORAL | 0 refills | Status: DC | PRN

## 2017-08-03 ENCOUNTER — Inpatient Hospital Stay: Payer: BLUE CROSS/BLUE SHIELD | Attending: Hematology

## 2017-08-03 DIAGNOSIS — Z452 Encounter for adjustment and management of vascular access device: Secondary | ICD-10-CM | POA: Insufficient documentation

## 2017-08-03 DIAGNOSIS — Z853 Personal history of malignant neoplasm of breast: Secondary | ICD-10-CM | POA: Insufficient documentation

## 2017-08-03 DIAGNOSIS — Z95828 Presence of other vascular implants and grafts: Secondary | ICD-10-CM | POA: Insufficient documentation

## 2017-08-03 MED ORDER — HEPARIN SOD (PORK) LOCK FLUSH 100 UNIT/ML IV SOLN
500.0000 [IU] | Freq: Once | INTRAVENOUS | Status: AC | PRN
  Administered 2017-08-03: 500 [IU]
  Filled 2017-08-03: qty 5

## 2017-08-03 MED ORDER — SODIUM CHLORIDE 0.9% FLUSH
10.0000 mL | INTRAVENOUS | Status: DC | PRN
  Administered 2017-08-03: 10 mL
  Filled 2017-08-03: qty 10

## 2017-08-19 NOTE — Progress Notes (Signed)
This encounter was created in error - please disregard.

## 2017-09-08 ENCOUNTER — Telehealth: Payer: Self-pay | Admitting: *Deleted

## 2017-09-08 NOTE — Telephone Encounter (Signed)
Received vm call from pt asking for refill on her pain med.  Reviewed Dr Ernestina Penna note & she states pt may have 60 Tramadol every 4 months.  Called pt & she states she is out & tylenol & advil do not help.  She states she takes tramadol 1-2/day maybe every 4 days & it helps right away for her arm pain.  Informed that we would discuss with Dr Burr Medico tomorrow & she is ok with this.  Message to Dr Feng/Pod RN

## 2017-09-09 ENCOUNTER — Other Ambulatory Visit: Payer: Self-pay | Admitting: Hematology

## 2017-09-09 DIAGNOSIS — C50911 Malignant neoplasm of unspecified site of right female breast: Secondary | ICD-10-CM

## 2017-09-09 MED ORDER — TRAMADOL HCL 50 MG PO TABS: 50.0000 mg | ORAL_TABLET | Freq: Every day | ORAL | 0 refills | Status: DC | PRN

## 2017-09-09 NOTE — Telephone Encounter (Signed)
I called pt, she has been using tramadol 1 tablet daily for her right arm pain which developed after her right breast mastectomy.  We discussed tramadol as a controlled prescription, and it may cause addiction.  I strongly encouraged her to try Neurontin or Lyrica to replace, tramadol, she will think about it, but does not want to try for now.  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 She agrees. I refilled her tramadol #60 for her, she will discussed with her PCP Ms Dema Severin on her next appointment in early may also.   Truitt Merle MD

## 2017-10-01 ENCOUNTER — Inpatient Hospital Stay: Payer: BLUE CROSS/BLUE SHIELD | Attending: Hematology

## 2017-10-01 DIAGNOSIS — Z853 Personal history of malignant neoplasm of breast: Secondary | ICD-10-CM | POA: Insufficient documentation

## 2017-10-01 DIAGNOSIS — Z95828 Presence of other vascular implants and grafts: Secondary | ICD-10-CM

## 2017-10-01 DIAGNOSIS — Z452 Encounter for adjustment and management of vascular access device: Secondary | ICD-10-CM | POA: Insufficient documentation

## 2017-10-01 MED ORDER — SODIUM CHLORIDE 0.9% FLUSH
10.0000 mL | INTRAVENOUS | Status: DC | PRN
  Administered 2017-10-01: 10 mL
  Filled 2017-10-01: qty 10

## 2017-10-01 MED ORDER — HEPARIN SOD (PORK) LOCK FLUSH 100 UNIT/ML IV SOLN
500.0000 [IU] | Freq: Once | INTRAVENOUS | Status: AC | PRN
  Administered 2017-10-01: 500 [IU]
  Filled 2017-10-01: qty 5

## 2017-12-29 ENCOUNTER — Other Ambulatory Visit: Payer: Self-pay | Admitting: Hematology

## 2017-12-29 DIAGNOSIS — Z1231 Encounter for screening mammogram for malignant neoplasm of breast: Secondary | ICD-10-CM

## 2018-02-01 ENCOUNTER — Ambulatory Visit
Admission: RE | Admit: 2018-02-01 | Discharge: 2018-02-01 | Disposition: A | Discharge status: Home / Self Care | Payer: BLUE CROSS/BLUE SHIELD | Source: Ambulatory Visit | Attending: Hematology | Admitting: Hematology

## 2018-02-01 DIAGNOSIS — Z1231 Encounter for screening mammogram for malignant neoplasm of breast: Secondary | ICD-10-CM

## 2018-03-03 ENCOUNTER — Other Ambulatory Visit: Payer: Self-pay | Admitting: Hematology

## 2018-03-03 DIAGNOSIS — C50911 Malignant neoplasm of unspecified site of right female breast: Secondary | ICD-10-CM

## 2018-03-03 NOTE — Telephone Encounter (Signed)
Patient calls for refill on Tramadol. In chart it states will refill once every 4 months.  Refilled med.

## 2018-04-26 ENCOUNTER — Telehealth: Payer: Self-pay

## 2018-04-26 NOTE — Telephone Encounter (Signed)
Notified patient form for Cancer Treatment Deferment has been signed by Dr. Burr Medico.  Patient requests it be mailed to her home address.  This was done today.  Copy sent for scanning.

## 2018-05-31 NOTE — Progress Notes (Signed)
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     10/24/2013 Surgery    Right total mastectomy of necrotizing breast tissue. Path revealed high grade carcinoma which extends into skin with involvement of dermal lymphatics.     10/25/2013 Imaging    CT C/A/P negative for distant metastases.    10/26/2013 Surgery    Completion right mastectomy & right axillary LN dissection: Surgical path showed high grade IDC, extending into skin. (+) LVI.  2/16 nodes positive for metastatic carcinoma. Due to prior surgery, tumor size difficult to measure, but at least T3.    11/03/2013 Echocardiogram    Pre-chemo EF normal: 55-60%.     11/28/2013 - 01/09/2014 Adjuvant Chemotherapy    Dose-dense AC x 4 completed.     01/23/2014 - 05/01/2014 Adjuvant Chemotherapy    Weekly Carbo/Taxol x 12. [Cycle #2 had mild reaction to Taxol that resolved with treatment; Cycle #7 held d/t neutropenia, but received 1 week later].  Completed all 12 cycles.     06/20/2014 - 07/28/2014 Radiation Therapy      Right chest wall and IM lymph nodes: total dose of 50.4 Gy over 28 fractions.  Right supraclavicular fossa and right axilla: total dose of 45 Gy over 25 fractions.     07/31/2014 Imaging    Restaging CT c/a/p negative for distant mets.  Also bone scan negative for skeletal mets.     01/02/2015 Imaging    MM DIAG BREAST TOMO UNI LEFT AND US BREAST LTD UNI LEFT INC AXILLA IMPRESSION: Benign cyst in dilated retroareolar ducts in the left breast. Small calcified fibroadenoma. No evidence of malignancy. BI-RADS CATEGORY  2: Benign.    12/06/2015 Imaging    MM Digital Screening Unilat L IMPRESSION: No mammographic evidence of malignancy. A result letter of this screening mammogram will be mailed directly to the patient.  RECOMMENDATION: Screening mammogram in one year. (Code:[id number])    02/01/2018 Mammogram    02/01/2018 Mammogram IMPRESSION: No mammographic evidence of malignancy. A result letter of this screening mammogram will be mailed directly to the patient.     CURRENT THERAPY Surveillance   INTERVAL HISTORY: Carmen Stone is a 62 y.o. female who is here for follow-up. Today, she is here alone. She is doing well and denies new pains or concerns. She denies arm swelling or ROM restriction. She still lives in the same house with her room mates.    Pertinent positives and negatives of review of systems are listed and detailed within the above HPI.  REVIEW OF SYSTEMS:   Constitutional: Denies fevers, chills or abnormal weight loss Eyes: Denies blurriness of vision Ears,  nose, mouth, throat, and face: Denies mucositis or sore throat Respiratory: Denies cough, dyspnea or wheezes Cardiovascular: Denies palpitation, chest discomfort or lower extremity swelling Gastrointestinal:  Denies nausea, heartburn or change in bowel habits Skin: Denies abnormal skin rashes Lymphatics: Denies new lymphadenopathy or easy bruising Neurological:Denies numbness, tingling or new  weaknesses Behavioral/Psych: Mood is stable, no new changes  All other systems were reviewed with the patient and are negative.  MEDICAL HISTORY:  Past Medical History:  Diagnosis Date  . Diabetes mellitus, type II ([place])   . Hyperlipemia   . Hypertension   . Inflammatory breast cancer ([place]) 10/22/13   right  . Radiation 06/20/14-07/28/14    SURGICAL HISTORY: Past Surgical History:  Procedure Laterality Date  . BREAST BIOPSY Right 10/22/2013   Procedure: BREAST BIOPSY;  Surgeon: [name], MD;  Location: [facility];  Service: General;  Laterality: Right;  . CESAREAN SECTION     x 1  . CHOLECYSTECTOMY  2011  . DRESSING CHANGE UNDER ANESTHESIA Right 10/26/2013   Procedure: COMPLETION MASTECTOMY, AXILLARY DISSECTION;  Surgeon: [name], MD;  Location: [facility];  Service: General;  Laterality: Right;  . INCISION AND DRAINAGE OF WOUND Right 10/22/2013   Procedure: IRRIGATION AND DRAINAGE AND DEBRIDEMENT RIGHT BREAST WOUND;  Surgeon: [name], MD;  Location: [facility];  Service: General;  Laterality: Right;  . IRRIGATION AND DEBRIDEMENT ABSCESS Right 10/24/2013   Procedure: Right total mastectomy;  Surgeon: [name], MD;  Location: [facility];  Service: General;  Laterality: Right;  . PORTACATH PLACEMENT N/A 11/16/2013   Procedure: INSERTION PORT-A-CATH;  Surgeon: [name], MD;  Location: [facility];  Service: General;  Laterality: N/A;  . VENTRAL HERNIA REPAIR  2011   repair incacerated VH with biologic mesh; cholecystectomy    I have reviewed the social history and family history with the patient and they are unchanged from previous note.  ALLERGIES:  has No Known Allergies.  MEDICATIONS:  Current Outpatient Medications  Medication Sig Dispense Refill  . acetaminophen (TYLENOL) 325 MG tablet Take 650 mg by mouth every 6 (six) hours as needed for mild pain.    . dorzolamide-timolol (COSOPT) 22.3-6.8 MG/ML ophthalmic solution instill 1 drop into both eyes  twice a day  1  . glucose blood test strip Use as instructed 100 each 12  . glucose monitoring kit (FREESTYLE) monitoring kit 1 each by Does not apply route as needed for other. Check blood sugars daily. 1 each 0  . Insulin Glargine (LANTUS SOLOSTAR) 100 UNIT/ML Solostar Pen Inject 10 Units into the skin daily at 10 pm. 15 mL 3  . latanoprost (XALATAN) 0.005 % ophthalmic solution Place 1 drop into both eyes 2 (two) times daily.  0  . lidocaine-prilocaine (EMLA) cream Apply 1 application topically as needed. 30 g 0  . lisinopril (PRINIVIL,ZESTRIL) 10 MG tablet Take 10 mg by mouth daily.   0  . LORazepam (ATIVAN) 0.5 MG tablet Take 1 tablet (0.5 mg total) by mouth at bedtime as needed (Nausea or vomiting). 30 tablet 0  . metFORMIN (GLUCOPHAGE) 500 MG tablet Take 1,000 mg by mouth daily.  0  . pravastatin (PRAVACHOL) 10 MG tablet Take 10 mg by mouth daily.   0  . timolol (TIMOPTIC) 0.5 % ophthalmic solution instill 1 drop into both eyes every morning  0   No current facility-administered medications for this visit.     PHYSICAL EXAMINATION: ECOG PERFORMANCE STATUS: 1 - Symptomatic but completely ambulatory  Vitals:  06/02/18 0928  BP: 131/68  Pulse: 87  Resp: 18  Temp: 98.2 F (36.8 C)  SpO2: 96%   Filed Weights   06/02/18 0928  Weight: 168 lb 6.4 oz (76.4 kg)    GENERAL:alert, no distress and comfortable SKIN: skin color, texture, turgor are normal, no rashes or significant lesions EYES: normal, Conjunctiva are pink and non-injected, sclera clear OROPHARYNX:no exudate, no erythema and lips, buccal mucosa, and tongue normal  NECK: supple, thyroid normal size, non-tender, without nodularity LYMPH:  no palpable lymphadenopathy in the cervical, axillary or inguinal LUNGS: clear to auscultation and percussion with normal breathing effort HEART: regular rate & rhythm and no murmurs and no lower extremity edema ABDOMEN:abdomen soft, non-tender and normal bowel sounds (+) anterior wall  hernia Musculoskeletal:no cyanosis of digits and no clubbing  NEURO: alert & oriented x 3 with fluent speech, no focal motor/sensory deficits Breast: (+) anteror chest skin tag close to her right mastectomy site (+) right mastectomy scar, healed well. No new palpable masses or skin or nipple changes in left breast.   LABORATORY DATA:  I have reviewed the data as listed CBC Latest Ref Rng & Units 06/02/2018 06/01/2017 11/27/2016  WBC 4.0 - 10.5 K/uL 8.5 8.4 7.9  Hemoglobin 12.0 - 15.0 g/dL 12.6 11.9 12.2  Hematocrit 36.0 - 46.0 % 37.6 35.9 35.9  Platelets 150 - 400 K/uL 277 247 251     CMP Latest Ref Rng & Units 06/02/2018 06/01/2017 11/27/2016  Glucose 70 - 99 mg/dL 262(H) 118 550 Repeated and Verified(H)  BUN 8 - 23 mg/dL 13 15.2 13.0  Creatinine 0.44 - 1.00 mg/dL 0.79 0.7 1.1  Sodium 135 - 145 mmol/L 137 138 135(L)  Potassium 3.5 - 5.1 mmol/L 4.4 4.2 4.3  Chloride 98 - 111 mmol/L 104 - -  CO2 22 - 32 mmol/L '26 24 27  ' Calcium 8.9 - 10.3 mg/dL 9.0 9.0 9.2  Total Protein 6.5 - 8.1 g/dL 6.9 7.0 6.7  Total Bilirubin 0.3 - 1.2 mg/dL 0.3 0.22 0.31  Alkaline Phos 38 - 126 U/L 112 97 166(H)  AST 15 - 41 U/L 13(L) 14 16  ALT 0 - 44 U/L '16 12 18      ' RADIOGRAPHIC STUDIES: I have personally reviewed the radiological images as listed and agreed with the findings in the report. No results found.   02/01/2018 Mammogram IMPRESSION: No mammographic evidence of malignancy. A result letter of this screening mammogram will be mailed directly to the patient.  ASSESSMENT & PLAN:  ALEASE FAIT is a 62 y.o. female with history of  1. 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 Treated with mastectomy, chemo and radiation. Currently on surveillance. -I reviewed and discussed her 2019 mammogram, which was benign. -Labs reviewed, CBC is WNLs. CMP and CA 27.29 pending.  -She follows with with [facility] for annual blood work. She denies having a bone density scan done.   -She is clinically stable with no concerns for recurrence. She will monitor her anterior chest wall skin tag. She knows to call for any concerns.  -It has been 4-1/2 years since her diagnosis, we discussed her risk of recurrence is minimal now. -She will continue surveillance and fu with NP Lacie in a year   2. Diabetes, type 2 -f/u with PCP -Labs reviewed, CMP pending.   3. Muscular cramps and right arm pain  -Likely related to her breast surgery. -I previously prescribed Tramadol. She knows that it's a control substance. She was  previously reluctant to switch to a different medication.  Her pain has overall improved, she has been off tramadol for a few months -I informed her that I will not refill Tramadol any more She understands and agrees. -She gets intermitted right arm pain, but overall improving. No swelling.  4. Anterior abdominal wall hernia -Been present since surgery. She denies pain or discomfort. -I advised her to wear a hernia belt and avoid heavy lifting. She agrees.  5. Cancer screening --She never had a colonoscopy.  She had a EGD with Dr. Simona Huh.  Will contact Dr. Loletha Carrow for screening colonoscopy in 2020  Plan  -Continue surveillance.  -f/u with Lacie in one year with labs -Will contact Dr. Loletha Carrow for 2020 colonoscopy  -screening left mammogram in 01/2019   No problem-specific Assessment & Plan notes found for this encounter.   Orders Placed This Encounter  Procedures  . MM Digital Screening Unilat L    Standing Status:   Future    Standing Expiration Date:   06/02/2019    Order Specific Question:   Reason for Exam (SYMPTOM  OR DIAGNOSIS REQUIRED)    Answer:   screening    Order Specific Question:   Preferred imaging location?    Answer:   [facility]   All questions were answered. The patient knows to call the clinic with any problems, questions or concerns. No barriers to learning was detected.  I spent 20 minutes counseling the patient face to  face. The total time spent in the appointment was 25 minutes and more than 50% was on counseling and review of test results  I, Noor Dweik am acting as scribe for Dr. Truitt Merle.  I have reviewed the above documentation for accuracy and completeness, and I agree with the above.     Truitt Merle, MD 06/02/2018

## 2018-06-01 ENCOUNTER — Other Ambulatory Visit: Payer: Self-pay | Admitting: *Deleted

## 2018-06-01 DIAGNOSIS — C50911 Malignant neoplasm of unspecified site of right female breast: Secondary | ICD-10-CM

## 2018-06-02 ENCOUNTER — Inpatient Hospital Stay: Payer: BLUE CROSS/BLUE SHIELD | Attending: Hematology | Admitting: Hematology

## 2018-06-02 ENCOUNTER — Other Ambulatory Visit: Payer: Self-pay | Admitting: Hematology

## 2018-06-02 ENCOUNTER — Encounter: Payer: Self-pay | Admitting: Hematology

## 2018-06-02 ENCOUNTER — Inpatient Hospital Stay: Payer: BLUE CROSS/BLUE SHIELD

## 2018-06-02 VITALS — BP 131/68 | HR 87 | Temp 98.2°F | Resp 18 | Wt 168.4 lb

## 2018-06-02 DIAGNOSIS — K439 Ventral hernia without obstruction or gangrene: Secondary | ICD-10-CM

## 2018-06-02 DIAGNOSIS — Z794 Long term (current) use of insulin: Secondary | ICD-10-CM | POA: Insufficient documentation

## 2018-06-02 DIAGNOSIS — Z9221 Personal history of antineoplastic chemotherapy: Secondary | ICD-10-CM | POA: Diagnosis not present

## 2018-06-02 DIAGNOSIS — Z1231 Encounter for screening mammogram for malignant neoplasm of breast: Secondary | ICD-10-CM

## 2018-06-02 DIAGNOSIS — M79601 Pain in right arm: Secondary | ICD-10-CM | POA: Insufficient documentation

## 2018-06-02 DIAGNOSIS — Z923 Personal history of irradiation: Secondary | ICD-10-CM

## 2018-06-02 DIAGNOSIS — E119 Type 2 diabetes mellitus without complications: Secondary | ICD-10-CM

## 2018-06-02 DIAGNOSIS — Z79899 Other long term (current) drug therapy: Secondary | ICD-10-CM | POA: Diagnosis not present

## 2018-06-02 DIAGNOSIS — I10 Essential (primary) hypertension: Secondary | ICD-10-CM | POA: Diagnosis not present

## 2018-06-02 DIAGNOSIS — C50911 Malignant neoplasm of unspecified site of right female breast: Secondary | ICD-10-CM

## 2018-06-02 DIAGNOSIS — Z171 Estrogen receptor negative status [ER-]: Secondary | ICD-10-CM | POA: Diagnosis not present

## 2018-06-02 DIAGNOSIS — Z853 Personal history of malignant neoplasm of breast: Secondary | ICD-10-CM

## 2018-06-02 LAB — CMP (CANCER CENTER ONLY)
ALK PHOS: 112 U/L (ref 38–126)
ALT: 16 U/L (ref 0–44)
ANION GAP: 7 (ref 5–15)
AST: 13 U/L — ABNORMAL LOW (ref 15–41)
Albumin: 3.5 g/dL (ref 3.5–5.0)
BILIRUBIN TOTAL: 0.3 mg/dL (ref 0.3–1.2)
BUN: 13 mg/dL (ref 8–23)
CALCIUM: 9 mg/dL (ref 8.9–10.3)
CO2: 26 mmol/L (ref 22–32)
Chloride: 104 mmol/L (ref 98–111)
Creatinine: 0.79 mg/dL (ref 0.44–1.00)
GFR, Est AFR Am: >60 mL/min (ref >60)
GFR, Estimated: >60 mL/min (ref >60)
GLUCOSE: 262 mg/dL — AB (ref 70–99)
POTASSIUM: 4.4 mmol/L (ref 3.5–5.1)
SODIUM: 137 mmol/L (ref 135–145)
Total Protein: 6.9 g/dL (ref 6.5–8.1)

## 2018-06-02 LAB — CBC WITH DIFFERENTIAL (CANCER CENTER ONLY)
Abs Immature Granulocytes: 0.04 10*3/uL (ref 0.00–0.07)
Basophils Absolute: 0.1 10*3/uL (ref 0.0–0.1)
Basophils Relative: 1 %
Eosinophils Absolute: 0.2 10*3/uL (ref 0.0–0.5)
Eosinophils Relative: 3 %
HEMATOCRIT: 37.6 % (ref 36.0–46.0)
HEMOGLOBIN: 12.6 g/dL (ref 12.0–15.0)
IMMATURE GRANULOCYTES: 1 %
LYMPHS ABS: 1.6 10*3/uL (ref 0.7–4.0)
Lymphocytes Relative: 19 %
MCH: 30.4 pg (ref 26.0–34.0)
MCHC: 33.5 g/dL (ref 30.0–36.0)
MCV: 90.6 fL (ref 80.0–100.0)
MONO ABS: 0.7 10*3/uL (ref 0.1–1.0)
MONOS PCT: 8 %
NEUTROS ABS: 5.9 10*3/uL (ref 1.7–7.7)
NEUTROS PCT: 68 %
Platelet Count: 277 10*3/uL (ref 150–400)
RBC: 4.15 MIL/uL (ref 3.87–5.11)
RDW: 12.2 % (ref 11.5–15.5)
WBC Count: 8.5 10*3/uL (ref 4.0–10.5)
nRBC: 0 % (ref 0.0–0.2)

## 2018-06-03 ENCOUNTER — Telehealth: Payer: Self-pay | Admitting: Hematology

## 2018-06-03 LAB — CANCER ANTIGEN 27.29: CA 27.29: 6.8 U/mL (ref 0.0–38.6)

## 2018-06-03 NOTE — Telephone Encounter (Signed)
Mailed calendar and avs.

## 2018-06-04 ENCOUNTER — Telehealth: Payer: Self-pay

## 2018-06-04 NOTE — Telephone Encounter (Signed)
Tried to call the patient. No answer and voice mail box is full.

## 2018-06-04 NOTE — Telephone Encounter (Signed)
-----   Message from Doran Stabler, MD sent at 06/04/2018  5:34 AM EST ----- Dr. Burr Medico,  Will do, thanks.   Vivien Rota,  Please arrange [place] nurse visit so this patient can have a screening colonoscopy with me. Thanks.  - HD ----- Message ----- From: Truitt Merle, MD Sent: 06/02/2018   9:45 AM EST To: Doran Stabler, MD  Mallie Mussel,  She has not had screening colonoscopy and is interested. You did her EGD. Could you get her in in 2020?  Thanks  Krista Blue

## 2018-06-07 ENCOUNTER — Telehealth: Payer: Self-pay

## 2018-06-07 NOTE — Telephone Encounter (Signed)
Spoke with patient regarding recent lab results, per Dr. Burr Medico informed her CA27.29 was normal, blood glucose is high, instructed her to f/u with PCP. Patient verbalized an understanding.

## 2018-06-07 NOTE — Telephone Encounter (Signed)
-----   Message from Truitt Merle, MD sent at 06/06/2018  2:41 PM EST ----- Please let pt know that her CA27.29 was normal, BG was high, f/uw with PCP for that, no other concerns.  Truitt Merle  06/06/2018

## 2018-06-15 NOTE — Telephone Encounter (Signed)
Pre-visit has been scheduled for 06-22-2018. Colonoscopy booked for 06-30-2018.

## 2018-06-22 ENCOUNTER — Encounter: Payer: Self-pay | Admitting: Gastroenterology

## 2018-06-30 ENCOUNTER — Encounter: Payer: BLUE CROSS/BLUE SHIELD | Admitting: Gastroenterology

## 2018-07-27 ENCOUNTER — Encounter: Payer: BLUE CROSS/BLUE SHIELD | Admitting: Gastroenterology

## 2018-11-21 ENCOUNTER — Emergency Department (HOSPITAL_COMMUNITY): Payer: BLUE CROSS/BLUE SHIELD | Admitting: Certified Registered Nurse Anesthetist

## 2018-11-21 ENCOUNTER — Encounter (HOSPITAL_COMMUNITY): Admission: EM | Disposition: E | Payer: Self-pay | Source: Home / Self Care

## 2018-11-21 ENCOUNTER — Emergency Department (HOSPITAL_COMMUNITY): Payer: BLUE CROSS/BLUE SHIELD

## 2018-11-21 ENCOUNTER — Inpatient Hospital Stay (HOSPITAL_COMMUNITY): Payer: BLUE CROSS/BLUE SHIELD

## 2018-11-21 ENCOUNTER — Inpatient Hospital Stay (HOSPITAL_COMMUNITY)
Admission: EM | Admit: 2018-11-21 | Discharge: 2019-01-15 | DRG: 853 | Disposition: E | Payer: BLUE CROSS/BLUE SHIELD | Attending: General Surgery | Admitting: General Surgery

## 2018-11-21 ENCOUNTER — Encounter (HOSPITAL_COMMUNITY): Payer: Self-pay

## 2018-11-21 ENCOUNTER — Other Ambulatory Visit: Payer: Self-pay

## 2018-11-21 DIAGNOSIS — Z6834 Body mass index (BMI) 34.0-34.9, adult: Secondary | ICD-10-CM

## 2018-11-21 DIAGNOSIS — N179 Acute kidney failure, unspecified: Secondary | ICD-10-CM | POA: Diagnosis not present

## 2018-11-21 DIAGNOSIS — I5181 Takotsubo syndrome: Secondary | ICD-10-CM | POA: Diagnosis not present

## 2018-11-21 DIAGNOSIS — Z7189 Other specified counseling: Secondary | ICD-10-CM | POA: Diagnosis not present

## 2018-11-21 DIAGNOSIS — D6959 Other secondary thrombocytopenia: Secondary | ICD-10-CM | POA: Diagnosis not present

## 2018-11-21 DIAGNOSIS — R188 Other ascites: Secondary | ICD-10-CM | POA: Diagnosis not present

## 2018-11-21 DIAGNOSIS — I5021 Acute systolic (congestive) heart failure: Secondary | ICD-10-CM | POA: Diagnosis not present

## 2018-11-21 DIAGNOSIS — K430 Incisional hernia with obstruction, without gangrene: Secondary | ICD-10-CM | POA: Diagnosis present

## 2018-11-21 DIAGNOSIS — Z452 Encounter for adjustment and management of vascular access device: Secondary | ICD-10-CM | POA: Diagnosis not present

## 2018-11-21 DIAGNOSIS — K567 Ileus, unspecified: Secondary | ICD-10-CM | POA: Diagnosis not present

## 2018-11-21 DIAGNOSIS — K659 Peritonitis, unspecified: Secondary | ICD-10-CM | POA: Diagnosis present

## 2018-11-21 DIAGNOSIS — R402114 Coma scale, eyes open, never, 24 hours or more after hospital admission: Secondary | ICD-10-CM | POA: Diagnosis not present

## 2018-11-21 DIAGNOSIS — Z66 Do not resuscitate: Secondary | ICD-10-CM | POA: Diagnosis not present

## 2018-11-21 DIAGNOSIS — Z20828 Contact with and (suspected) exposure to other viral communicable diseases: Secondary | ICD-10-CM | POA: Diagnosis present

## 2018-11-21 DIAGNOSIS — J9601 Acute respiratory failure with hypoxia: Secondary | ICD-10-CM | POA: Diagnosis not present

## 2018-11-21 DIAGNOSIS — Y838 Other surgical procedures as the cause of abnormal reaction of the patient, or of later complication, without mention of misadventure at the time of the procedure: Secondary | ICD-10-CM | POA: Diagnosis not present

## 2018-11-21 DIAGNOSIS — Z992 Dependence on renal dialysis: Secondary | ICD-10-CM | POA: Diagnosis not present

## 2018-11-21 DIAGNOSIS — G928 Other toxic encephalopathy: Secondary | ICD-10-CM | POA: Diagnosis present

## 2018-11-21 DIAGNOSIS — T829XXA Unspecified complication of cardiac and vascular prosthetic device, implant and graft, initial encounter: Secondary | ICD-10-CM

## 2018-11-21 DIAGNOSIS — G92 Toxic encephalopathy: Secondary | ICD-10-CM | POA: Diagnosis not present

## 2018-11-21 DIAGNOSIS — Z9049 Acquired absence of other specified parts of digestive tract: Secondary | ICD-10-CM

## 2018-11-21 DIAGNOSIS — D689 Coagulation defect, unspecified: Secondary | ICD-10-CM | POA: Diagnosis not present

## 2018-11-21 DIAGNOSIS — K660 Peritoneal adhesions (postprocedural) (postinfection): Secondary | ICD-10-CM | POA: Diagnosis present

## 2018-11-21 DIAGNOSIS — Z923 Personal history of irradiation: Secondary | ICD-10-CM

## 2018-11-21 DIAGNOSIS — E43 Unspecified severe protein-calorie malnutrition: Secondary | ICD-10-CM | POA: Diagnosis not present

## 2018-11-21 DIAGNOSIS — Z4659 Encounter for fitting and adjustment of other gastrointestinal appliance and device: Secondary | ICD-10-CM

## 2018-11-21 DIAGNOSIS — K631 Perforation of intestine (nontraumatic): Secondary | ICD-10-CM

## 2018-11-21 DIAGNOSIS — R0600 Dyspnea, unspecified: Secondary | ICD-10-CM | POA: Diagnosis not present

## 2018-11-21 DIAGNOSIS — E785 Hyperlipidemia, unspecified: Secondary | ICD-10-CM | POA: Diagnosis present

## 2018-11-21 DIAGNOSIS — D696 Thrombocytopenia, unspecified: Secondary | ICD-10-CM | POA: Diagnosis present

## 2018-11-21 DIAGNOSIS — K7200 Acute and subacute hepatic failure without coma: Secondary | ICD-10-CM | POA: Diagnosis not present

## 2018-11-21 DIAGNOSIS — J9602 Acute respiratory failure with hypercapnia: Secondary | ICD-10-CM | POA: Diagnosis not present

## 2018-11-21 DIAGNOSIS — N170 Acute kidney failure with tubular necrosis: Secondary | ICD-10-CM | POA: Diagnosis not present

## 2018-11-21 DIAGNOSIS — I248 Other forms of acute ischemic heart disease: Secondary | ICD-10-CM | POA: Diagnosis present

## 2018-11-21 DIAGNOSIS — R6521 Severe sepsis with septic shock: Secondary | ICD-10-CM | POA: Diagnosis not present

## 2018-11-21 DIAGNOSIS — I469 Cardiac arrest, cause unspecified: Secondary | ICD-10-CM | POA: Diagnosis not present

## 2018-11-21 DIAGNOSIS — R402214 Coma scale, best verbal response, none, 24 hours or more after hospital admission: Secondary | ICD-10-CM | POA: Diagnosis not present

## 2018-11-21 DIAGNOSIS — G9341 Metabolic encephalopathy: Secondary | ICD-10-CM | POA: Diagnosis not present

## 2018-11-21 DIAGNOSIS — I82B12 Acute embolism and thrombosis of left subclavian vein: Secondary | ICD-10-CM | POA: Diagnosis not present

## 2018-11-21 DIAGNOSIS — Z794 Long term (current) use of insulin: Secondary | ICD-10-CM

## 2018-11-21 DIAGNOSIS — R402314 Coma scale, best motor response, none, 24 hours or more after hospital admission: Secondary | ICD-10-CM | POA: Diagnosis not present

## 2018-11-21 DIAGNOSIS — Z95828 Presence of other vascular implants and grafts: Secondary | ICD-10-CM

## 2018-11-21 DIAGNOSIS — Z9221 Personal history of antineoplastic chemotherapy: Secondary | ICD-10-CM

## 2018-11-21 DIAGNOSIS — K460 Unspecified abdominal hernia with obstruction, without gangrene: Secondary | ICD-10-CM

## 2018-11-21 DIAGNOSIS — J9811 Atelectasis: Secondary | ICD-10-CM | POA: Diagnosis not present

## 2018-11-21 DIAGNOSIS — I82C12 Acute embolism and thrombosis of left internal jugular vein: Secondary | ICD-10-CM | POA: Diagnosis not present

## 2018-11-21 DIAGNOSIS — K9189 Other postprocedural complications and disorders of digestive system: Secondary | ICD-10-CM | POA: Diagnosis not present

## 2018-11-21 DIAGNOSIS — E876 Hypokalemia: Secondary | ICD-10-CM | POA: Diagnosis not present

## 2018-11-21 DIAGNOSIS — A419 Sepsis, unspecified organism: Secondary | ICD-10-CM | POA: Diagnosis present

## 2018-11-21 DIAGNOSIS — R0682 Tachypnea, not elsewhere classified: Secondary | ICD-10-CM | POA: Diagnosis not present

## 2018-11-21 DIAGNOSIS — J9600 Acute respiratory failure, unspecified whether with hypoxia or hypercapnia: Secondary | ICD-10-CM | POA: Diagnosis not present

## 2018-11-21 DIAGNOSIS — Z853 Personal history of malignant neoplasm of breast: Secondary | ICD-10-CM

## 2018-11-21 DIAGNOSIS — R680 Hypothermia, not associated with low environmental temperature: Secondary | ICD-10-CM | POA: Diagnosis not present

## 2018-11-21 DIAGNOSIS — E878 Other disorders of electrolyte and fluid balance, not elsewhere classified: Secondary | ICD-10-CM | POA: Diagnosis present

## 2018-11-21 DIAGNOSIS — Z515 Encounter for palliative care: Secondary | ICD-10-CM

## 2018-11-21 DIAGNOSIS — Z09 Encounter for follow-up examination after completed treatment for conditions other than malignant neoplasm: Secondary | ICD-10-CM

## 2018-11-21 DIAGNOSIS — Z801 Family history of malignant neoplasm of trachea, bronchus and lung: Secondary | ICD-10-CM

## 2018-11-21 DIAGNOSIS — J9690 Respiratory failure, unspecified, unspecified whether with hypoxia or hypercapnia: Secondary | ICD-10-CM

## 2018-11-21 DIAGNOSIS — I82A12 Acute embolism and thrombosis of left axillary vein: Secondary | ICD-10-CM | POA: Diagnosis not present

## 2018-11-21 DIAGNOSIS — Z01818 Encounter for other preprocedural examination: Secondary | ICD-10-CM

## 2018-11-21 DIAGNOSIS — E11649 Type 2 diabetes mellitus with hypoglycemia without coma: Secondary | ICD-10-CM | POA: Diagnosis present

## 2018-11-21 DIAGNOSIS — K55029 Acute infarction of small intestine, extent unspecified: Secondary | ICD-10-CM | POA: Diagnosis not present

## 2018-11-21 DIAGNOSIS — Z825 Family history of asthma and other chronic lower respiratory diseases: Secondary | ICD-10-CM

## 2018-11-21 DIAGNOSIS — E1152 Type 2 diabetes mellitus with diabetic peripheral angiopathy with gangrene: Secondary | ICD-10-CM | POA: Diagnosis present

## 2018-11-21 DIAGNOSIS — E039 Hypothyroidism, unspecified: Secondary | ICD-10-CM | POA: Diagnosis present

## 2018-11-21 DIAGNOSIS — I429 Cardiomyopathy, unspecified: Secondary | ICD-10-CM | POA: Diagnosis not present

## 2018-11-21 DIAGNOSIS — E874 Mixed disorder of acid-base balance: Secondary | ICD-10-CM | POA: Diagnosis not present

## 2018-11-21 DIAGNOSIS — R9431 Abnormal electrocardiogram [ECG] [EKG]: Secondary | ICD-10-CM | POA: Diagnosis not present

## 2018-11-21 DIAGNOSIS — Z9011 Acquired absence of right breast and nipple: Secondary | ICD-10-CM

## 2018-11-21 DIAGNOSIS — G4089 Other seizures: Secondary | ICD-10-CM | POA: Diagnosis not present

## 2018-11-21 DIAGNOSIS — R198 Other specified symptoms and signs involving the digestive system and abdomen: Secondary | ICD-10-CM | POA: Diagnosis not present

## 2018-11-21 DIAGNOSIS — R509 Fever, unspecified: Secondary | ICD-10-CM | POA: Diagnosis not present

## 2018-11-21 DIAGNOSIS — L8990 Pressure ulcer of unspecified site, unspecified stage: Secondary | ICD-10-CM | POA: Insufficient documentation

## 2018-11-21 DIAGNOSIS — M7989 Other specified soft tissue disorders: Secondary | ICD-10-CM | POA: Diagnosis not present

## 2018-11-21 DIAGNOSIS — Z79899 Other long term (current) drug therapy: Secondary | ICD-10-CM

## 2018-11-21 DIAGNOSIS — R34 Anuria and oliguria: Secondary | ICD-10-CM | POA: Diagnosis not present

## 2018-11-21 DIAGNOSIS — T8579XA Infection and inflammatory reaction due to other internal prosthetic devices, implants and grafts, initial encounter: Secondary | ICD-10-CM

## 2018-11-21 DIAGNOSIS — D6489 Other specified anemias: Secondary | ICD-10-CM | POA: Diagnosis not present

## 2018-11-21 DIAGNOSIS — J90 Pleural effusion, not elsewhere classified: Secondary | ICD-10-CM

## 2018-11-21 DIAGNOSIS — Z0189 Encounter for other specified special examinations: Secondary | ICD-10-CM

## 2018-11-21 HISTORY — PX: LAPAROTOMY: SHX154

## 2018-11-21 HISTORY — PX: LYSIS OF ADHESION: SHX5961

## 2018-11-21 HISTORY — PX: BOWEL RESECTION: SHX1257

## 2018-11-21 LAB — BLOOD GAS, ARTERIAL
Acid-base deficit: 15.2 mmol/L — ABNORMAL HIGH (ref 0.0–2.0)
Acid-base deficit: 9.8 mmol/L — ABNORMAL HIGH (ref 0.0–2.0)
Acid-base deficit: 10.7 mmol/L — ABNORMAL HIGH (ref 0.0–2.0)
Bicarbonate: 14.4 mmol/L — ABNORMAL LOW (ref 20.0–28.0)
Bicarbonate: 15.2 mmol/L — ABNORMAL LOW (ref 20.0–28.0)
Bicarbonate: 14.8 mmol/L — ABNORMAL LOW (ref 20.0–28.0)
Drawn by: 270211
Drawn by: 25788
Drawn by: 514251
FIO2: 0.8
FIO2: 40
FIO2: 40
MECHVT: 430 mL
MECHVT: 430 mL
MECHVT: 430 mL
O2 Saturation: 98.9 %
O2 Saturation: 96.7 %
O2 Saturation: 94.1 %
PEEP: 5 cmH2O
PEEP: 5 cmH2O
PEEP: 5 cmH2O
Patient temperature: 37
Patient temperature: 37
Patient temperature: 98
RATE: 18 resp/min
RATE: 22 resp/min
RATE: 22 resp/min
pCO2 arterial: 49.8 mmHg — ABNORMAL HIGH (ref 32.0–48.0)
pCO2 arterial: 31.6 mmHg — ABNORMAL LOW (ref 32.0–48.0)
pCO2 arterial: 32.9 mmHg (ref 32.0–48.0)
pH, Arterial: 7.088 — CL (ref 7.350–7.450)
pH, Arterial: 7.303 — ABNORMAL LOW (ref 7.350–7.450)
pH, Arterial: 7.274 — ABNORMAL LOW (ref 7.350–7.450)
pO2, Arterial: 362 mmHg — ABNORMAL HIGH (ref 83.0–108.0)
pO2, Arterial: 98.1 mmHg (ref 83.0–108.0)
pO2, Arterial: 75.7 mmHg — ABNORMAL LOW (ref 83.0–108.0)

## 2018-11-21 LAB — COMPREHENSIVE METABOLIC PANEL
ALT: 18 U/L (ref 0–44)
AST: 17 U/L (ref 15–41)
Albumin: 4.2 g/dL (ref 3.5–5.0)
Alkaline Phosphatase: 115 U/L (ref 38–126)
Anion gap: 11 (ref 5–15)
BUN: 18 mg/dL (ref 8–23)
CO2: 23 mmol/L (ref 22–32)
Calcium: 9.2 mg/dL (ref 8.9–10.3)
Chloride: 102 mmol/L (ref 98–111)
Creatinine, Ser: 0.73 mg/dL (ref 0.44–1.00)
GFR calc Af Amer: >60 mL/min (ref >60)
GFR calc non Af Amer: >60 mL/min (ref >60)
Glucose, Bld: 495 mg/dL — ABNORMAL HIGH (ref 70–99)
Potassium: 3.7 mmol/L (ref 3.5–5.1)
Sodium: 136 mmol/L (ref 135–145)
Total Bilirubin: 0.5 mg/dL (ref 0.3–1.2)
Total Protein: 7.9 g/dL (ref 6.5–8.1)

## 2018-11-21 LAB — GLUCOSE, CAPILLARY
Glucose-Capillary: 357 mg/dL — ABNORMAL HIGH (ref 70–99)
Glucose-Capillary: 303 mg/dL — ABNORMAL HIGH (ref 70–99)
Glucose-Capillary: 282 mg/dL — ABNORMAL HIGH (ref 70–99)
Glucose-Capillary: 289 mg/dL — ABNORMAL HIGH (ref 70–99)
Glucose-Capillary: 227 mg/dL — ABNORMAL HIGH (ref 70–99)
Glucose-Capillary: 306 mg/dL — ABNORMAL HIGH (ref 70–99)
Glucose-Capillary: 305 mg/dL — ABNORMAL HIGH (ref 70–99)
Glucose-Capillary: 257 mg/dL — ABNORMAL HIGH (ref 70–99)
Glucose-Capillary: 231 mg/dL — ABNORMAL HIGH (ref 70–99)

## 2018-11-21 LAB — CBC WITH DIFFERENTIAL/PLATELET
Abs Immature Granulocytes: 0.15 10*3/uL — ABNORMAL HIGH (ref 0.00–0.07)
Basophils Absolute: 0.1 10*3/uL (ref 0.0–0.1)
Basophils Relative: 0 %
Eosinophils Absolute: 0 10*3/uL (ref 0.0–0.5)
Eosinophils Relative: 0 %
HCT: 43.5 % (ref 36.0–46.0)
Hemoglobin: 14.1 g/dL (ref 12.0–15.0)
Immature Granulocytes: 1 %
Lymphocytes Relative: 7 %
Lymphs Abs: 1.5 10*3/uL (ref 0.7–4.0)
MCH: 30.1 pg (ref 26.0–34.0)
MCHC: 32.4 g/dL (ref 30.0–36.0)
MCV: 92.8 fL (ref 80.0–100.0)
Monocytes Absolute: 0.9 10*3/uL (ref 0.1–1.0)
Monocytes Relative: 4 %
Neutro Abs: 19.6 10*3/uL — ABNORMAL HIGH (ref 1.7–7.7)
Neutrophils Relative %: 88 %
Platelets: 306 10*3/uL (ref 150–400)
RBC: 4.69 MIL/uL (ref 3.87–5.11)
RDW: 12.3 % (ref 11.5–15.5)
WBC: 22.3 10*3/uL — ABNORMAL HIGH (ref 4.0–10.5)
nRBC: 0 % (ref 0.0–0.2)

## 2018-11-21 LAB — CBC
HCT: 33.4 % — ABNORMAL LOW (ref 36.0–46.0)
Hemoglobin: 10.7 g/dL — ABNORMAL LOW (ref 12.0–15.0)
MCH: 30.7 pg (ref 26.0–34.0)
MCHC: 32 g/dL (ref 30.0–36.0)
MCV: 96 fL (ref 80.0–100.0)
Platelets: 163 10*3/uL (ref 150–400)
RBC: 3.48 MIL/uL — ABNORMAL LOW (ref 3.87–5.11)
RDW: 12.9 % (ref 11.5–15.5)
WBC: 2.7 10*3/uL — ABNORMAL LOW (ref 4.0–10.5)
nRBC: 0 % (ref 0.0–0.2)

## 2018-11-21 LAB — POCT I-STAT 7, (LYTES, BLD GAS, ICA,H+H)
Acid-base deficit: 13 mmol/L — ABNORMAL HIGH (ref 0.0–2.0)
Bicarbonate: 14.9 mmol/L — ABNORMAL LOW (ref 20.0–28.0)
Calcium, Ion: 1 mmol/L — ABNORMAL LOW (ref 1.15–1.40)
HCT: 34 % — ABNORMAL LOW (ref 36.0–46.0)
Hemoglobin: 11.6 g/dL — ABNORMAL LOW (ref 12.0–15.0)
O2 Saturation: 100 %
Patient temperature: 35.1
Potassium: 3.5 mmol/L (ref 3.5–5.1)
Sodium: 145 mmol/L (ref 135–145)
TCO2: 16 mmol/L — ABNORMAL LOW (ref 22–32)
pCO2 arterial: 39 mmHg (ref 32.0–48.0)
pH, Arterial: 7.179 — CL (ref 7.350–7.450)
pO2, Arterial: 226 mmHg — ABNORMAL HIGH (ref 83.0–108.0)

## 2018-11-21 LAB — URINALYSIS, ROUTINE W REFLEX MICROSCOPIC
Bilirubin Urine: NEGATIVE
Glucose, UA: >=500 mg/dL — AB
Hgb urine dipstick: NEGATIVE
Ketones, ur: 20 mg/dL — AB
Nitrite: NEGATIVE
Protein, ur: NEGATIVE mg/dL
Specific Gravity, Urine: 1.032 — ABNORMAL HIGH (ref 1.005–1.030)
pH: 5 (ref 5.0–8.0)

## 2018-11-21 LAB — APTT: aPTT: 27 seconds (ref 24–36)

## 2018-11-21 LAB — SARS CORONAVIRUS 2 BY RT PCR (HOSPITAL ORDER, PERFORMED IN ~~LOC~~ HOSPITAL LAB): SARS Coronavirus 2: NEGATIVE

## 2018-11-21 LAB — CORTISOL: Cortisol, Plasma: 32.5 ug/dL

## 2018-11-21 LAB — PROTIME-INR
INR: 1.2 (ref 0.8–1.2)
Prothrombin Time: 15.2 seconds (ref 11.4–15.2)

## 2018-11-21 LAB — LIPASE, BLOOD: Lipase: 24 U/L (ref 11–51)

## 2018-11-21 SURGERY — LAPAROTOMY, EXPLORATORY
Anesthesia: General | Site: Abdomen

## 2018-11-21 MED ORDER — ETOMIDATE 2 MG/ML IV SOLN
INTRAVENOUS | Status: AC
  Filled 2018-11-21: qty 20

## 2018-11-21 MED ORDER — INSULIN ASPART 100 UNIT/ML ~~LOC~~ SOLN
3.0000 [IU] | SUBCUTANEOUS | Status: DC
  Administered 2018-11-21: 9 [IU] via SUBCUTANEOUS

## 2018-11-21 MED ORDER — PIPERACILLIN-TAZOBACTAM 3.375 G IVPB
3.3750 g | Freq: Three times a day (TID) | INTRAVENOUS | Status: DC
  Administered 2018-11-21 – 2018-11-23 (×5): 3.375 g via INTRAVENOUS
  Filled 2018-11-21 (×5): qty 50

## 2018-11-21 MED ORDER — SODIUM BICARBONATE 8.4 % IV SOLN
INTRAVENOUS | Status: DC | PRN
  Administered 2018-11-21: 50 meq via INTRAVENOUS

## 2018-11-21 MED ORDER — NOREPINEPHRINE BITARTRATE 1 MG/ML IV SOLN
2.0000 ug/min | INTRAVENOUS | Status: DC
  Administered 2018-11-21: 61 ug/min via INTRAVENOUS
  Administered 2018-11-21: 50 ug/min via INTRAVENOUS
  Administered 2018-11-22: 70 ug/min via INTRAVENOUS
  Administered 2018-11-22: 12:00:00 67 ug/min via INTRAVENOUS
  Administered 2018-11-22: 66 ug/min via INTRAVENOUS
  Administered 2018-11-22: 07:00:00 67 ug/min via INTRAVENOUS
  Filled 2018-11-21 (×6): qty 16
  Filled 2018-11-21: qty 12

## 2018-11-21 MED ORDER — LACTATED RINGERS IV SOLN
INTRAVENOUS | Status: DC
  Administered 2018-11-21: 16:00:00 via INTRAVENOUS

## 2018-11-21 MED ORDER — INSULIN REGULAR(HUMAN) IN NACL 100-0.9 UT/100ML-% IV SOLN
INTRAVENOUS | Status: DC
  Administered 2018-11-21: 2.5 [IU]/h via INTRAVENOUS

## 2018-11-21 MED ORDER — PROPOFOL 10 MG/ML IV BOLUS
INTRAVENOUS | Status: DC | PRN
  Administered 2018-11-21: 30 mg via INTRAVENOUS

## 2018-11-21 MED ORDER — INSULIN ASPART 100 UNIT/ML ~~LOC~~ SOLN: 0.0000 [IU] | SUBCUTANEOUS | Status: DC

## 2018-11-21 MED ORDER — SODIUM CHLORIDE (PF) 0.9 % IJ SOLN
INTRAMUSCULAR | Status: AC
  Filled 2018-11-21: qty 50

## 2018-11-21 MED ORDER — HYDROMORPHONE HCL 1 MG/ML IJ SOLN: 1.0000 mg | INTRAMUSCULAR | Status: DC | PRN

## 2018-11-21 MED ORDER — DIPHENHYDRAMINE HCL 12.5 MG/5ML PO ELIX: 12.5000 mg | ORAL_SOLUTION | Freq: Four times a day (QID) | ORAL | Status: DC | PRN

## 2018-11-21 MED ORDER — FENTANYL CITRATE (PF) 100 MCG/2ML IJ SOLN
INTRAMUSCULAR | Status: DC | PRN
  Administered 2018-11-21 (×2): 50 ug via INTRAVENOUS

## 2018-11-21 MED ORDER — SUCCINYLCHOLINE CHLORIDE 20 MG/ML IJ SOLN
INTRAMUSCULAR | Status: DC | PRN
  Administered 2018-11-21: 100 mg via INTRAVENOUS

## 2018-11-21 MED ORDER — ETOMIDATE 2 MG/ML IV SOLN
INTRAVENOUS | Status: DC | PRN
  Administered 2018-11-21: 14 mg via INTRAVENOUS

## 2018-11-21 MED ORDER — SUCCINYLCHOLINE CHLORIDE 200 MG/10ML IV SOSY
PREFILLED_SYRINGE | INTRAVENOUS | Status: AC
  Filled 2018-11-21: qty 10

## 2018-11-21 MED ORDER — IOHEXOL 300 MG/ML  SOLN
100.0000 mL | Freq: Once | INTRAMUSCULAR | Status: AC | PRN
  Administered 2018-11-21: 100 mL via INTRAVENOUS

## 2018-11-21 MED ORDER — SODIUM CHLORIDE 0.9% FLUSH
10.0000 mL | INTRAVENOUS | Status: DC | PRN
  Administered 2018-11-26 – 2018-12-17 (×6): 10 mL
  Administered 2018-12-20: 16:00:00 20 mL
  Administered 2018-12-20 (×2): 10 mL
  Administered 2018-12-21: 16:00:00 20 mL
  Administered 2018-12-21 – 2018-12-24 (×2): 10 mL
  Administered 2018-12-24: 16:00:00 20 mL
  Administered 2019-01-03: 10 mL
  Filled 2018-11-21 (×14): qty 40

## 2018-11-21 MED ORDER — STERILE WATER FOR IRRIGATION IR SOLN
Status: DC | PRN
  Administered 2018-11-21: 1000 mL

## 2018-11-21 MED ORDER — FENTANYL CITRATE (PF) 100 MCG/2ML IJ SOLN: 25.0000 ug | Freq: Once | INTRAMUSCULAR | Status: DC

## 2018-11-21 MED ORDER — SODIUM BICARBONATE 8.4 % IV SOLN
INTRAVENOUS | Status: AC
  Administered 2018-11-21: 23:00:00 100 meq via INTRAVENOUS
  Filled 2018-11-21: qty 100

## 2018-11-21 MED ORDER — SODIUM CHLORIDE 0.9 % IV BOLUS
1000.0000 mL | Freq: Once | INTRAVENOUS | Status: AC
  Administered 2018-11-21: 1000 mL via INTRAVENOUS

## 2018-11-21 MED ORDER — NOREPINEPHRINE BITARTRATE 1 MG/ML IV SOLN
INTRAVENOUS | Status: DC | PRN
  Administered 2018-11-21: 3 ug/min via INTRAVENOUS

## 2018-11-21 MED ORDER — SODIUM BICARBONATE 8.4 % IV SOLN
100.0000 meq | Freq: Once | INTRAVENOUS | Status: AC
  Administered 2018-11-21 (×2): 100 meq via INTRAVENOUS

## 2018-11-21 MED ORDER — ONDANSETRON HCL 4 MG/2ML IJ SOLN
4.0000 mg | Freq: Once | INTRAMUSCULAR | Status: AC
  Administered 2018-11-21: 4 mg via INTRAVENOUS
  Filled 2018-11-21: qty 2

## 2018-11-21 MED ORDER — STERILE WATER FOR INJECTION IV SOLN
INTRAVENOUS | Status: DC
  Administered 2018-11-21 – 2018-11-22 (×2): via INTRAVENOUS
  Filled 2018-11-21: qty 850

## 2018-11-21 MED ORDER — DIPHENHYDRAMINE HCL 50 MG/ML IJ SOLN: 12.5000 mg | Freq: Four times a day (QID) | INTRAMUSCULAR | Status: DC | PRN

## 2018-11-21 MED ORDER — LIDOCAINE HCL (CARDIAC) PF 100 MG/5ML IV SOSY
PREFILLED_SYRINGE | INTRAVENOUS | Status: DC | PRN
  Administered 2018-11-21: 4 mL via INTRATRACHEAL

## 2018-11-21 MED ORDER — CHLORHEXIDINE GLUCONATE CLOTH 2 % EX PADS
6.0000 | MEDICATED_PAD | Freq: Every day | CUTANEOUS | Status: DC
  Administered 2018-11-21: 6 via TOPICAL

## 2018-11-21 MED ORDER — ALBUMIN HUMAN 5 % IV SOLN
INTRAVENOUS | Status: DC | PRN
  Administered 2018-11-21 (×3): via INTRAVENOUS

## 2018-11-21 MED ORDER — SODIUM CHLORIDE 0.9 % IV SOLN
25.0000 ug/h | INTRAVENOUS | Status: DC
  Administered 2018-11-21: 50 ug/h via INTRAVENOUS
  Administered 2018-11-22 – 2018-11-24 (×3): 125 ug/h via INTRAVENOUS
  Administered 2018-11-27: 25 ug/h via INTRAVENOUS
  Administered 2018-11-28 (×2): 50 ug/h via INTRAVENOUS
  Filled 2018-11-21 (×6): qty 50

## 2018-11-21 MED ORDER — PROPOFOL 10 MG/ML IV BOLUS
INTRAVENOUS | Status: AC
  Filled 2018-11-21: qty 20

## 2018-11-21 MED ORDER — ROCURONIUM BROMIDE 100 MG/10ML IV SOLN
INTRAVENOUS | Status: DC | PRN
  Administered 2018-11-21 (×2): 40 mg via INTRAVENOUS

## 2018-11-21 MED ORDER — PROPOFOL 1000 MG/100ML IV EMUL: 0.0000 ug/kg/min | INTRAVENOUS | Status: DC

## 2018-11-21 MED ORDER — MORPHINE SULFATE (PF) 4 MG/ML IV SOLN
4.0000 mg | Freq: Once | INTRAVENOUS | Status: AC
  Administered 2018-11-21: 4 mg via INTRAVENOUS
  Filled 2018-11-21: qty 1

## 2018-11-21 MED ORDER — SODIUM CHLORIDE 0.9 % IV SOLN
INTRAVENOUS | Status: DC
  Administered 2018-11-21 (×2): via INTRAVENOUS

## 2018-11-21 MED ORDER — FENTANYL CITRATE (PF) 250 MCG/5ML IJ SOLN
INTRAMUSCULAR | Status: AC
  Filled 2018-11-21: qty 5

## 2018-11-21 MED ORDER — KETAMINE HCL 50 MG/5ML IJ SOSY: 0.3000 mg/kg | PREFILLED_SYRINGE | Freq: Once | INTRAMUSCULAR | Status: DC

## 2018-11-21 MED ORDER — NOREPINEPHRINE BITARTRATE 1 MG/ML IV SOLN
2.0000 ug/min | INTRAVENOUS | Status: DC
  Administered 2018-11-21: 10 ug/min via INTRAVENOUS
  Administered 2018-11-21: 6 ug/min via INTRAVENOUS
  Administered 2018-11-21: 2.507 ug/min via INTRAVENOUS
  Filled 2018-11-21: qty 4

## 2018-11-21 MED ORDER — 0.9 % SODIUM CHLORIDE (POUR BTL) OPTIME
TOPICAL | Status: DC | PRN
  Administered 2018-11-21: 10:00:00 6000 mL

## 2018-11-21 MED ORDER — PIPERACILLIN-TAZOBACTAM 4.5 G IVPB: 4.5000 g | Freq: Once | INTRAVENOUS | Status: DC

## 2018-11-21 MED ORDER — ALBUTEROL SULFATE (2.5 MG/3ML) 0.083% IN NEBU
2.5000 mg | INHALATION_SOLUTION | RESPIRATORY_TRACT | Status: DC | PRN
  Filled 2018-11-21: qty 3

## 2018-11-21 MED ORDER — SODIUM CHLORIDE 0.9 % IV SOLN
INTRAVENOUS | Status: DC | PRN
  Administered 2018-11-21: 2.1 [IU]/h via INTRAVENOUS

## 2018-11-21 MED ORDER — VASOPRESSIN 20 UNIT/ML IV SOLN
INTRAVENOUS | Status: DC | PRN
  Administered 2018-11-21 (×2): 2 [IU] via INTRAVENOUS
  Administered 2018-11-21: 1 [IU] via INTRAVENOUS
  Administered 2018-11-21: 2 [IU] via INTRAVENOUS
  Administered 2018-11-21 (×2): 1 [IU] via INTRAVENOUS
  Administered 2018-11-21: 2 [IU] via INTRAVENOUS
  Administered 2018-11-21: 4 [IU] via INTRAVENOUS
  Administered 2018-11-21: 1 [IU] via INTRAVENOUS
  Administered 2018-11-21 (×2): 2 [IU] via INTRAVENOUS

## 2018-11-21 MED ORDER — PHENYLEPHRINE HCL-NACL 10-0.9 MG/250ML-% IV SOLN
0.0000 ug/min | INTRAVENOUS | Status: DC
  Administered 2018-11-21: 20 ug/min via INTRAVENOUS
  Administered 2018-11-22: 400 ug/min via INTRAVENOUS
  Filled 2018-11-21 (×3): qty 250

## 2018-11-21 MED ORDER — POTASSIUM CHLORIDE IN NACL 20-0.9 MEQ/L-% IV SOLN: INTRAVENOUS | Status: DC

## 2018-11-21 MED ORDER — PROPOFOL 10 MG/ML IV BOLUS
INTRAVENOUS | Status: DC | PRN
  Administered 2018-11-21: 30 ug/kg/min via INTRAVENOUS

## 2018-11-21 MED ORDER — FLUCONAZOLE IN SODIUM CHLORIDE 400-0.9 MG/200ML-% IV SOLN
400.0000 mg | INTRAVENOUS | Status: DC
  Administered 2018-11-22: 18:00:00 400 mg via INTRAVENOUS
  Filled 2018-11-21: qty 200

## 2018-11-21 MED ORDER — PIPERACILLIN-TAZOBACTAM 3.375 G IVPB 30 MIN
3.3750 g | Freq: Once | INTRAVENOUS | Status: AC
  Administered 2018-11-21: 3.375 g via INTRAVENOUS
  Filled 2018-11-21: qty 50

## 2018-11-21 MED ORDER — DEXTROSE 50 % IV SOLN: 25.0000 mL | INTRAVENOUS | Status: DC | PRN

## 2018-11-21 MED ORDER — SODIUM CHLORIDE 0.9% FLUSH: 10.0000 mL | INTRAVENOUS | Status: DC | PRN

## 2018-11-21 MED ORDER — PANTOPRAZOLE SODIUM 40 MG IV SOLR
40.0000 mg | INTRAVENOUS | Status: DC
  Administered 2018-11-21 – 2019-01-09 (×50): 40 mg via INTRAVENOUS
  Filled 2018-11-21 (×51): qty 40

## 2018-11-21 MED ORDER — PROPOFOL 1000 MG/100ML IV EMUL
5.0000 ug/kg/min | INTRAVENOUS | Status: DC
  Administered 2018-11-21: 15.152 ug/kg/min via INTRAVENOUS
  Filled 2018-11-21: qty 100

## 2018-11-21 MED ORDER — FENTANYL BOLUS VIA INFUSION
25.0000 ug | INTRAVENOUS | Status: DC | PRN
  Administered 2018-11-22 – 2018-11-27 (×12): 25 ug via INTRAVENOUS
  Filled 2018-11-21: qty 25

## 2018-11-21 MED ORDER — INSULIN REGULAR(HUMAN) IN NACL 100-0.9 UT/100ML-% IV SOLN
INTRAVENOUS | Status: DC
  Administered 2018-11-21: 4.4 [IU]/h via INTRAVENOUS
  Administered 2018-11-21: 5.5 [IU]/h via INTRAVENOUS
  Filled 2018-11-21: qty 100

## 2018-11-21 MED ORDER — NOREPINEPHRINE 16 MG/250ML-% IV SOLN
2.0000 ug/min | INTRAVENOUS | Status: DC
  Filled 2018-11-21: qty 250

## 2018-11-21 MED ORDER — FENTANYL CITRATE (PF) 100 MCG/2ML IJ SOLN
25.0000 ug | INTRAMUSCULAR | Status: DC | PRN
  Administered 2018-11-21 (×3): 25 ug via INTRAVENOUS

## 2018-11-21 MED ORDER — DORZOLAMIDE HCL-TIMOLOL MAL 2-0.5 % OP SOLN
1.0000 [drp] | Freq: Two times a day (BID) | OPHTHALMIC | Status: DC
  Administered 2018-11-21 – 2019-01-09 (×96): 1 [drp] via OPHTHALMIC
  Filled 2018-11-21 (×2): qty 10

## 2018-11-21 MED ORDER — HYDROCORTISONE NA SUCCINATE PF 100 MG IJ SOLR
50.0000 mg | Freq: Four times a day (QID) | INTRAMUSCULAR | Status: DC
  Administered 2018-11-21 – 2018-11-29 (×30): 50 mg via INTRAVENOUS
  Filled 2018-11-21 (×31): qty 2

## 2018-11-21 MED ORDER — FENTANYL CITRATE (PF) 100 MCG/2ML IJ SOLN
INTRAMUSCULAR | Status: AC
  Filled 2018-11-21: qty 2

## 2018-11-21 MED ORDER — KETAMINE HCL 50 MG/5ML IJ SOSY
20.0000 mg | PREFILLED_SYRINGE | Freq: Once | INTRAMUSCULAR | Status: AC
  Administered 2018-11-21: 20 mg via INTRAVENOUS
  Filled 2018-11-21: qty 5

## 2018-11-21 MED ORDER — SODIUM BICARBONATE 8.4 % IV SOLN
INTRAVENOUS | Status: AC
  Filled 2018-11-21: qty 100

## 2018-11-21 MED ORDER — LIDOCAINE 2% (20 MG/ML) 5 ML SYRINGE
INTRAMUSCULAR | Status: AC
  Filled 2018-11-21: qty 5

## 2018-11-21 MED ORDER — CHLORHEXIDINE GLUCONATE CLOTH 2 % EX PADS: 6.0000 | MEDICATED_PAD | Freq: Every day | CUTANEOUS | Status: DC

## 2018-11-21 MED ORDER — SODIUM BICARBONATE 8.4 % IV SOLN
100.0000 meq | Freq: Once | INTRAVENOUS | Status: AC
  Administered 2018-11-21: 13:00:00 100 meq via INTRAVENOUS

## 2018-11-21 MED ORDER — INSULIN REGULAR BOLUS VIA INFUSION
0.0000 [IU] | Freq: Three times a day (TID) | INTRAVENOUS | Status: DC
  Filled 2018-11-21: qty 10

## 2018-11-21 MED ORDER — ONDANSETRON HCL 4 MG/2ML IJ SOLN
INTRAMUSCULAR | Status: AC
  Filled 2018-11-21: qty 2

## 2018-11-21 MED ORDER — ENOXAPARIN SODIUM 40 MG/0.4ML ~~LOC~~ SOLN
40.0000 mg | SUBCUTANEOUS | Status: DC
  Filled 2018-11-21: qty 0.4

## 2018-11-21 MED ORDER — ROCURONIUM BROMIDE 10 MG/ML (PF) SYRINGE
PREFILLED_SYRINGE | INTRAVENOUS | Status: AC
  Filled 2018-11-21: qty 10

## 2018-11-21 MED ORDER — PHENYLEPHRINE HCL (PRESSORS) 10 MG/ML IV SOLN
INTRAVENOUS | Status: AC
  Filled 2018-11-21: qty 1

## 2018-11-21 MED ORDER — FLUCONAZOLE IN SODIUM CHLORIDE 400-0.9 MG/200ML-% IV SOLN
800.0000 mg | Freq: Once | INTRAVENOUS | Status: AC
  Administered 2018-11-21: 800 mg via INTRAVENOUS
  Filled 2018-11-21 (×2): qty 400

## 2018-11-21 MED ORDER — VASOPRESSIN 20 UNIT/ML IV SOLN
0.0300 [IU]/min | INTRAVENOUS | Status: DC
  Administered 2018-11-21 – 2018-11-28 (×8): 0.03 [IU]/min via INTRAVENOUS
  Filled 2018-11-21 (×10): qty 2

## 2018-11-21 SURGICAL SUPPLY — 32 items
APL PRP STRL LF DISP 70% ISPRP (MISCELLANEOUS) ×1
CANISTER WOUNDNEG PRESSURE 500 (CANNISTER) ×2 IMPLANT
CHLORAPREP W/TINT 26 (MISCELLANEOUS) ×3 IMPLANT
DRAPE LAPAROSCOPIC ABDOMINAL (DRAPES) ×3 IMPLANT
DRAPE WARM FLUID 44X44 (DRAPES) ×2 IMPLANT
ELECT REM PT RETURN 15FT ADLT (MISCELLANEOUS) ×3 IMPLANT
GLOVE BIO SURGEON STRL SZ7.5 (GLOVE) ×2 IMPLANT
GLOVE BIOGEL PI IND STRL 7.0 (GLOVE) ×1 IMPLANT
GLOVE BIOGEL PI IND STRL 7.5 (GLOVE) IMPLANT
GLOVE BIOGEL PI INDICATOR 7.0 (GLOVE) ×2
GLOVE BIOGEL PI INDICATOR 7.5 (GLOVE) ×6
GLOVE SURG SIGNA 7.5 PF LTX (GLOVE) ×5 IMPLANT
GLOVE SURG SS PI 7.0 STRL IVOR (GLOVE) ×2 IMPLANT
GOWN STRL REUS W/TWL LRG LVL3 (GOWN DISPOSABLE) ×3 IMPLANT
GOWN STRL REUS W/TWL XL LVL3 (GOWN DISPOSABLE) ×8 IMPLANT
KIT BASIN OR (CUSTOM PROCEDURE TRAY) ×3 IMPLANT
KIT TURNOVER KIT A (KITS) ×2 IMPLANT
LIGASURE IMPACT 36 18CM CVD LR (INSTRUMENTS) ×2 IMPLANT
PACK GENERAL/GYN (CUSTOM PROCEDURE TRAY) ×3 IMPLANT
RELOAD PROXIMATE 75MM BLUE (ENDOMECHANICALS) ×3 IMPLANT
RELOAD STAPLE 75 3.8 BLU REG (ENDOMECHANICALS) IMPLANT
SHEARS HARMONIC ACE PLUS 36CM (ENDOMECHANICALS) IMPLANT
SPONGE ABD ABTHERA ADVANCE (MISCELLANEOUS) ×2 IMPLANT
SPONGE LAP 18X18 X RAY DECT (DISPOSABLE) ×10 IMPLANT
STAPLER PROXIMATE 75MM BLUE (STAPLE) ×4 IMPLANT
STAPLER VISISTAT 35W (STAPLE) ×3 IMPLANT
SUT SILK 2 0 SH CR/8 (SUTURE) ×2 IMPLANT
SUT SILK 3 0 (SUTURE) ×3
SUT SILK 3 0 SH CR/8 (SUTURE) ×2 IMPLANT
SUT SILK 3-0 18XBRD TIE 12 (SUTURE) IMPLANT
TOWEL OR 17X26 10 PK STRL BLUE (TOWEL DISPOSABLE) ×3 IMPLANT
TRAY FOLEY MTR SLVR 16FR STAT (SET/KITS/TRAYS/PACK) ×3 IMPLANT

## 2018-11-21 NOTE — Progress Notes (Signed)
eLink Physician-Brief Progress Note Patient Name: FELIZA DIVEN DOB: 1956-01-07 MRN: [id number]   Date of Service  11/16/2018  HPI/Events of Note  Multiple issuse: 1. Hypotension - BP = 69/38 and 2. ABG = 7.274/32.9/75.7. Already on Norepinephrine and Vasopressin IV infusions and stress dose Hydrocortisone. CVP = 13.   eICU Interventions  Will order: 1. Phenylephrine IV infusion. Titrate to MAP >= 65. 2. NaHCO3 100 meq IV now. 3. NaHCO3 IV infusion.  4. Repeat ABG at 1 AM.      Intervention Category Major Interventions: Acid-Base disturbance - evaluation and management;Hypotension - evaluation and management  Lysle Dingwall 11/19/2018, 10:46 PM

## 2018-11-21 NOTE — Progress Notes (Signed)
Pharmacy Antibiotic Note  Carmen Stone is a 63 y.o. female admitted on 12/05/2018 with perforated loop of SB.  Pharmacy has been consulted for zosyn and fluconazole dosing. 6/7: perforated small bowel from closed-loop obstruction. S/p exp lap. SBR, LOA, placement of wound VAC.   Plan: Zosyn 3.375g IV q8h (4 hour infusion).  Fluconazole 800 mg IV x 1 then 400 mg IV q24 F/u renal fxn, clinical course  Height: 5\' 4"  (162.6 cm) Weight: 165 lb (74.8 kg) IBW/kg (Calculated) : 54.7  Temp (24hrs), Avg:98.1 F (36.7 C), Min:98 F (36.7 C), Max:98.2 F (36.8 C)  Recent Labs  Lab 11/22/2018 0355  WBC 22.3*  CREATININE 0.73    Estimated Creatinine Clearance: 70.3 mL/min (by C-G formula based on SCr of 0.73 mg/dL).    No Known Allergies  Antimicrobials this admission: 6/7 zosyn> 6/7 fluconazole> Dose adjustments this admission:  Microbiology results: 6/7 Covid 19 negative  Thank you for allowing pharmacy to be a part of this patient's care.  Eudelia Bunch, Pharm.D 11/15/2018 3:30 PM

## 2018-11-21 NOTE — Anesthesia Postprocedure Evaluation (Signed)
Anesthesia Post Note  Patient: Carmen Stone  Procedure(s) Performed: EXPLORATORY LAPAROTOMY (N/A Abdomen) LYSIS OF ADHESIONS (N/A Abdomen) SMALL BOWEL RESECTION (N/A Abdomen)     Patient location during evaluation: PACU Anesthesia Type: General Level of consciousness: sedated and patient remains intubated per anesthesia plan Pain management: pain level controlled Vital Signs Assessment: vitals unstable and post-procedure vital signs reviewed and stable Respiratory status: patient remains intubated per anesthesia plan Cardiovascular status: unstable and tachycardic Postop Assessment: no apparent nausea or vomiting Anesthetic complications: no Comments: Transported to PACU intubated, sedated. Remains acidotic and requiring vasoactive infusions for blood pressure support.    Last Vitals:  Vitals:   11/30/2018 1300 11/15/2018 1330  BP: (!) 80/41 (!) 94/47  Pulse:  (!) 150  Resp:  (!) 33  Temp:    SpO2:  100%    Last Pain:  Vitals:   12/05/2018 0910  TempSrc:   PainSc: 10-Worst pain ever                 Catalina Gravel

## 2018-11-21 NOTE — Anesthesia Procedure Notes (Signed)
Arterial Line Insertion Start/End06/09/2018 9:25 AM, 11/25/2018 9:30 AM Performed by: Catalina Gravel, MD, anesthesiologist  Patient location: Pre-op. Preanesthetic checklist: patient identified, IV checked, site marked, risks and benefits discussed, surgical consent, monitors and equipment checked, pre-op evaluation, timeout performed and anesthesia consent Lidocaine 1% used for infiltration Right, radial was placed Catheter size: 20 Fr Hand hygiene performed  and maximum sterile barriers used   Attempts: 2 Procedure performed without using ultrasound guided technique. Following insertion, dressing applied and Biopatch. Post procedure assessment: normal and unchanged  Patient tolerated the procedure well with no immediate complications.

## 2018-11-21 NOTE — Progress Notes (Signed)
Peri Op NSG NOTE: informed by OR of emergency surgical case for this patient, rec report from primary RN in ED on pt, immediately tx pt to preop area, placed on continuous cardiac monitoring, indicating ST of 140'sm RR, 32/min, placed on 4 lpm [place] continued from ED, NBP initiated, MDA at bedside, order for Fentanyl 34mcg IV rec and administered secondary to pts c/o pain at 10 on 0-10 scale . Dr. Ninfa Linden, MD at bedside. IVF at Rt and Lt arms continue to infuse of NS at full rate.

## 2018-11-21 NOTE — H&P (Addendum)
Carmen Stone is an 62 y.o. female.   Chief Complaint: perforated bowel HPI: This is a 63 year old female who presented to the emergency department this morning with abdominal pain, nausea, and vomiting.  Reports severe abdominal pain.  She underwent a CT scan of her abdomen and pelvis.  She has perforated small bowel with a large amount of free air as well as multiple incarcerated incisional hernias.  She is tachycardic and in distress.  She is minimally conversant secondary to discomfort. She has a history of multiple medical problems and has a history of inflammatory breast cancer  Past Medical History:  Diagnosis Date  . Diabetes mellitus, type II ([place])   . Hyperlipemia   . Hypertension   . Inflammatory breast cancer ([place]) 10/22/13   right  . Radiation 06/20/14-07/28/14    Past Surgical History:  Procedure Laterality Date  . BREAST BIOPSY Right 10/22/2013   Procedure: BREAST BIOPSY;  Surgeon: [name], MD;  Location: [facility];  Service: General;  Laterality: Right;  . CESAREAN SECTION     x 1  . CHOLECYSTECTOMY  2011  . DRESSING CHANGE UNDER ANESTHESIA Right 10/26/2013   Procedure: COMPLETION MASTECTOMY, AXILLARY DISSECTION;  Surgeon: [name], MD;  Location: [facility];  Service: General;  Laterality: Right;  . INCISION AND DRAINAGE OF WOUND Right 10/22/2013   Procedure: IRRIGATION AND DRAINAGE AND DEBRIDEMENT RIGHT BREAST WOUND;  Surgeon: [name], MD;  Location: [facility];  Service: General;  Laterality: Right;  . IRRIGATION AND DEBRIDEMENT ABSCESS Right 10/24/2013   Procedure: Right total mastectomy;  Surgeon: [name], MD;  Location: [facility];  Service: General;  Laterality: Right;  . PORTACATH PLACEMENT N/A 11/16/2013   Procedure: INSERTION PORT-A-CATH;  Surgeon: [name], MD;  Location: [facility];  Service: General;  Laterality: N/A;  . VENTRAL HERNIA REPAIR  2011   repair incacerated VH with biologic mesh; cholecystectomy    Family History   Problem Relation Age of Onset  . Lung cancer Mother        smoker  . Emphysema Father        smoker  . Colon cancer Neg Hx    Social History:  reports that she has never smoked. She has never used smokeless tobacco. She reports that she does not drink alcohol or use drugs.  Allergies: No Known Allergies  (Not in a hospital admission)   Results for orders placed or performed during the hospital encounter of 12/07/2018 (from the past 48 hour(s))  CBC with Differential     Status: Abnormal   Collection Time: 11/26/2018  3:55 AM  Result Value Ref Range   WBC 22.3 (H) 4.0 - 10.5 K/uL   RBC 4.69 3.87 - 5.11 MIL/uL   Hemoglobin 14.1 12.0 - 15.0 g/dL   HCT 43.5 36.0 - 46.0 %   MCV 92.8 80.0 - 100.0 fL   MCH 30.1 26.0 - 34.0 pg   MCHC 32.4 30.0 - 36.0 g/dL   RDW 12.3 11.5 - 15.5 %   Platelets 306 150 - 400 K/uL   nRBC 0.0 0.0 - 0.2 %   Neutrophils Relative % 88 %   Neutro Abs 19.6 (H) 1.7 - 7.7 K/uL   Lymphocytes Relative 7 %   Lymphs Abs 1.5 0.7 - 4.0 K/uL   Monocytes Relative 4 %   Monocytes Absolute 0.9 0.1 - 1.0 K/uL   Eosinophils Relative 0 %   Eosinophils Absolute 0.0 0.0 - 0.5 K/uL   Basophils  Relative 0 %   Basophils Absolute 0.1 0.0 - 0.1 K/uL   Immature Granulocytes 1 %   Abs Immature Granulocytes 0.15 (H) 0.00 - 0.07 K/uL    Comment: Performed at [facility], [place] [address], [place], [place] [address]  Comprehensive metabolic panel     Status: Abnormal   Collection Time: 11/16/2018  3:55 AM  Result Value Ref Range   Sodium 136 135 - 145 mmol/L   Potassium 3.7 3.5 - 5.1 mmol/L   Chloride 102 98 - 111 mmol/L   CO2 23 22 - 32 mmol/L   Glucose, Bld 495 (H) 70 - 99 mg/dL   BUN 18 8 - 23 mg/dL   Creatinine, Ser 0.73 0.44 - 1.00 mg/dL   Calcium 9.2 8.9 - 10.3 mg/dL   Total Protein 7.9 6.5 - 8.1 g/dL   Albumin 4.2 3.5 - 5.0 g/dL   AST 17 15 - 41 U/L   ALT 18 0 - 44 U/L   Alkaline Phosphatase 115 38 - 126 U/L   Total Bilirubin 0.5 0.3 - 1.2 mg/dL    GFR calc non Af Amer >60 >60 mL/min   GFR calc Af Amer >60 >60 mL/min   Anion gap 11 5 - 15    Comment: Performed at [facility], [place] [address], [place], [place] [address]  SARS Coronavirus 2 (CEPHEID - Performed in [place] hospital lab), Hosp Order     Status: None   Collection Time: 11/20/2018  3:55 AM  Result Value Ref Range   SARS Coronavirus 2 NEGATIVE NEGATIVE    Comment: (NOTE) If result is NEGATIVE SARS-CoV-2 target nucleic acids are NOT DETECTED. The SARS-CoV-2 RNA is generally detectable in upper and lower  respiratory specimens during the acute phase of infection. The lowest  concentration of SARS-CoV-2 viral copies this assay can detect is 250  copies / mL. A negative result does not preclude SARS-CoV-2 infection  and should not be used as the sole basis for treatment or other  patient management decisions.  A negative result may occur with  improper specimen collection / handling, submission of specimen other  than nasopharyngeal swab, presence of viral mutation(s) within the  areas targeted by this assay, and inadequate number of viral copies  (<250 copies / mL). A negative result must be combined with clinical  observations, patient history, and epidemiological information. If result is POSITIVE SARS-CoV-2 target nucleic acids are DETECTED. The SARS-CoV-2 RNA is generally detectable in upper and lower  respiratory specimens dur ing the acute phase of infection.  Positive  results are indicative of active infection with SARS-CoV-2.  Clinical  correlation with patient history and other diagnostic information is  necessary to determine patient infection status.  Positive results do  not rule out bacterial infection or co-infection with other viruses. If result is PRESUMPTIVE POSTIVE SARS-CoV-2 nucleic acids MAY BE PRESENT.   A presumptive positive result was obtained on the submitted specimen  and confirmed on repeat testing.  While 2019 novel  coronavirus  (SARS-CoV-2) nucleic acids may be present in the submitted sample  additional confirmatory testing may be necessary for epidemiological  and / or clinical management purposes  to differentiate between  SARS-CoV-2 and other Sarbecovirus currently known to infect humans.  If clinically indicated additional testing with an alternate test  methodology [phone]) is advised. The SARS-CoV-2 RNA is generally  detectable in upper and lower respiratory sp ecimens during the acute  phase of infection. The expected result  is Negative. Fact Sheet for Patients:  StrictlyIdeas.no Fact Sheet for Healthcare Providers: BankingDealers.co.za This test is not yet approved or cleared by the [place] FDA and has been authorized for detection and/or diagnosis of SARS-CoV-2 by FDA under an Emergency Use Authorization (EUA).  This EUA will remain in effect (meaning this test can be used) for the duration of the COVID-19 declaration under Section 564(b)(1) of the Act, 21 U.S.C. section 360bbb-3(b)(1), unless the authorization is terminated or revoked sooner. Performed at [facility], [place] [address], [place], [place] [address]   Urinalysis, Routine w reflex microscopic     Status: Abnormal   Collection Time: 12/02/2018  3:55 AM  Result Value Ref Range   Color, Urine STRAW (A) YELLOW   APPearance CLEAR CLEAR   Specific Gravity, Urine 1.032 (H) 1.005 - 1.030   pH 5.0 5.0 - 8.0   Glucose, UA >=500 (A) NEGATIVE mg/dL   Hgb urine dipstick NEGATIVE NEGATIVE   Bilirubin Urine NEGATIVE NEGATIVE   Ketones, ur 20 (A) NEGATIVE mg/dL   Protein, ur NEGATIVE NEGATIVE mg/dL   Nitrite NEGATIVE NEGATIVE   Leukocytes,Ua TRACE (A) NEGATIVE   RBC / HPF 0-5 0 - 5 RBC/hpf   WBC, UA 0-5 0 - 5 WBC/hpf   Bacteria, UA RARE (A) NONE SEEN   Squamous Epithelial / LPF 0-5 0 - 5    Comment: Performed at [facility], [place] [address], [place], [place] [address]  Lipase, blood     Status: None   Collection Time: 11/22/2018  3:55 AM  Result Value Ref Range   Lipase 24 11 - 51 U/L    Comment: Performed at [facility], [place] [address], [place],  [address]   Ct Abdomen Pelvis W Contrast  Result Date: 11/24/2018 CLINICAL DATA:  Patient with abdominal pain, nausea and vomiting. EXAM: CT ABDOMEN AND PELVIS WITH CONTRAST TECHNIQUE: Multidetector CT imaging of the abdomen and pelvis was performed using the standard protocol following bolus administration of intravenous contrast. CONTRAST:  141mL OMNIPAQUE IOHEXOL 300 MG/ML  SOLN COMPARISON:  Acute abdominal series earlier same day FINDINGS: Lower chest: Normal heart size. Dependent atelectasis within the bilateral lower lobes. Hepatobiliary: The liver is normal in size and contour. Subcentimeter too small to characterize low-attenuation lesion right hepatic lobe (image 22; series 2). Gallbladder surgically absent. Pancreas: Unremarkable Spleen: Unremarkable Adrenals/Urinary Tract: Normal adrenal glands. Kidneys enhance symmetrically with contrast. No hydronephrosis. Urinary bladder mildly thick walled. Stomach/Bowel: There is a focally dilated loop of small bowel within the central abdomen containing small bowel feces. There is at least 1 focal perforation of this small bowel loop with extensive adjacent intraperitoneal fluid and gas (image 70; series 5). The inferior portion of the small bowel loop appears herniated within the left lower anterior abdominal wall (image 60; series 2). The more cranial portion of the small bowel loop appears to be involved within an additional abdominal wall hernia more cranially (image 38; series 2). There is extensive free intraperitoneal gas throughout the abdomen as well with small amount of free intraperitoneal fluid within the lower abdomen. Sigmoid colonic diverticulosis. A portion of the transverse colon is located within an abdominal  wall hernia without definite evidence for obstruction (image 29; series 2). Vascular/Lymphatic: Normal caliber abdominal aorta. No retroperitoneal lymphadenopathy. Reproductive: Uterus is surgically absent. Other: Multiple complex small and large bowel containing ventral abdominal wall hernias are demonstrated. Musculoskeletal: Lower lumbar spine and thoracic spine degenerative changes. Right hip joint  degenerative changes. IMPRESSION: 1. There is a focally perforated loop of small bowel within the central abdomen with extensive free intraperitoneal gas and fluid. This may be a closed loop obstruction of the small bowel loop as the inferior aspect of this loop is contained within a lower anterior abdominal wall hernia and the more cranial portion of the small bowel loop is contained within a central anterior abdominal wall hernia. 2. Multiple complex ventral abdominal hernias containing both small and large bowel. Critical Value/emergent results were called by telephone at the time of interpretation on 11/29/2018 at 7:49 am to Dr. Sedonia Small, who verbally acknowledged these results. Electronically Signed   By: Lovey Newcomer M.D.   On: 12/11/2018 07:58   Dg Abdomen Acute W/chest  Result Date: 12/11/2018 CLINICAL DATA:  Abdominal pain, nausea and vomiting. EXAM: DG ABDOMEN ACUTE W/ 1V CHEST COMPARISON:  Chest radiograph 11/16/2013 FINDINGS: Left anterior chest wall Port-A-Cath is present with tip projecting over the superior vena cava. Monitoring leads overlie the patient. Stable cardiac and mediastinal contours. Low lung volumes with bibasilar atelectasis. No large area pulmonary consolidation. Large amount of stool throughout the colon. Multiple gaseous distended loops of bowel throughout the central abdomen. No definite free intraperitoneal air. Lumbar spine degenerative changes. IMPRESSION: Multiple gaseous distended loops of bowel throughout the abdomen raising the possibility of ileus or possible early bowel  obstruction. Large amount of stool throughout the colon as can be seen with constipation. Electronically Signed   By: Lovey Newcomer M.D.   On: 11/22/2018 05:42    Review of Systems  Unable to perform ROS: Acuity of condition    Blood pressure 122/73, pulse (!) 153, temperature 98.2 F (36.8 C), temperature source Oral, resp. rate (!) 25, height 5\' 4"  (1.626 m), weight 74.8 kg, SpO2 96 %. Physical Exam  Constitutional: She appears well-developed and well-nourished. She appears distressed.  HENT:  Head: Normocephalic and atraumatic.  Right Ear: External ear normal.  Left Ear: External ear normal.  Nose: Nose normal.  Eyes: Pupils are equal, round, and reactive to light. No scleral icterus.  Neck: Normal range of motion.  Cardiovascular: Normal heart sounds.  No murmur heard. Tachycardic  Respiratory: Effort normal and breath sounds normal. No respiratory distress.  GI: She exhibits distension. There is abdominal tenderness. There is rebound and guarding.  Distended, diffusely tender abdomen with obvious peritonitis with guarding throughout.  She has a large midline incision.  She has a chronically incarcerated large ventral incisional hernia  Musculoskeletal: Normal range of motion.        General: No deformity.  Neurological:  She has decreased mental status and does not communicate well secondary to her discomfort  Skin: Skin is warm and dry. No erythema.     Assessment/Plan Acute abdomen with perforated bowel and peritonitis  She will require emergency exploratory laparotomy.  I discussed this by phone with her brother.  I explained the gravity of the situation.  I explained the surgical procedure.  She will more than likely require a bowel resection.  She may require an ostomy.  More than likely, she will remain on the ventilator postoperatively.  She may require return trips to the operating room with an open abdomen.  I discussed other risks of bleeding, infection, cardiopulmonary  issues, DVT, and even death given her overall health.  He understands.  Surgery is scheduled emergently  Coralie Keens, MD 11/24/2018, 8:18 AM

## 2018-11-21 NOTE — Consult Note (Signed)
NAME:  Carmen Stone, MRN:  [id number], DOB:  02/28/1956, LOS: 0 ADMISSION DATE:  12/11/2018, CONSULTATION DATE:  11/21/2018 REFERRING MD:  Dr. Ninfa Linden, Surgery, CHIEF COMPLAINT:  Abdominal pain   Brief History   63 yo female presented to ER with nausea, vomiting, abdominal pain.  CT abd/pelvis showed focal perforated loop of SB with free air with multiple complex ventral hernias.  Had emergent laparotomy with SB resection, lysis of adhesions and wound vac.  Remained on vent and pressors post op.  History from chart and medical/surgical team.  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, HTN, HLD, DM type II  Significant Hospital Events   6/07 Admit, to OR  Consults:    Procedures:  ETT 6/07 >> Rt IJ CVL 6/07 >> Lt radial A line 6/07 >>   Significant Diagnostic Tests:  CT abd/pelvis 6/07 >> focal perforated loop of SB with free air with multiple complex ventral hernias  Micro Data:  COVID 6/07 >> negative  Antimicrobials:  Zosyn 6/07 >> Diflucan 6/07 >>  Interim history/subjective:    Objective   Blood pressure (!) 80/41, pulse (!) 146, temperature 98.1 F (36.7 C), resp. rate 18, height _0  (1.626 m), weight 74.8 kg, SpO2 100 %.    Vent Mode: PRVC FiO2 (%):  [80 %-100 %] 80 % Set Rate:  [18 bmp] 18 bmp Vt Set:  [430 mL] 430 mL PEEP:  [5 cmH20] 5 cmH20 Plateau Pressure:  [18 cmH20] 18 cmH20   Intake/Output Summary (Last 24 hours) at 11/22/2018 1336 Last data filed at 11/26/2018 1150 Gross per 24 hour  Intake 1550 ml  Output 1090 ml  Net 460 ml   Filed Weights   11/19/2018 0309  Weight: 74.8 kg    Examination:  General - sedated Eyes - pupils reactive ENT - ETT, NG in place Cardiac - regular, tachycardic Chest - b/l expiratory wheezing, s/p Rt mastectomy Abdomen - wound vac in place Extremities - no cyanosis, clubbing, or edema Skin - no rashes Neuro - RASS -2  CXR (reviewed by me) - no acute ASD.  ETT, CVL in  place.  Resolved Hospital Problem list     Assessment & Plan:   Acute hypoxic respiratory failure in setting of peritonitis. Plan - full vent support - f/u CXR, ABG  Septic shock from peritonitis. Plan - pressors to keep MAP > 65 - continue IV fluids - day 1 of zosyn, diflucan  S/p SB resection, lysis of adhesions, wound vac. Plan - post op care, nutrition per surgery  DM type II. Plan - SSI - hold outpt metformin  Acute metabolic encephalopathy. Plan - RASS goal 0 to -1  Hx of HTN, HLD. Plan - hold outpt lisinopril, pravachol  Best practice:  Diet: NPO DVT prophylaxis: SCDs GI prophylaxis: Protonix Mobility: Bed rest Code Status: Full code Disposition: ICU  Labs   CBC: Recent Labs  Lab 11/20/2018 0355 11/15/2018 1119  WBC 22.3*  --   NEUTROABS 19.6*  --   HGB 14.1 11.6*  HCT 43.5 34.0*  MCV 92.8  --   PLT 306  --     Basic Metabolic Panel: Recent Labs  Lab 11/20/2018 0355 12/05/2018 1119  NA 136 145  K 3.7 3.5  CL 102  --   CO2 23  --   GLUCOSE 495*  --   BUN 18  --   CREATININE 0.73  --   CALCIUM 9.2  --  GFR: Estimated Creatinine Clearance: 70.3 mL/min (by C-G formula based on SCr of 0.73 mg/dL). Recent Labs  Lab 11/20/2018 0355  WBC 22.3*    Liver Function Tests: Recent Labs  Lab 12/06/2018 0355  AST 17  ALT 18  ALKPHOS 115  BILITOT 0.5  PROT 7.9  ALBUMIN 4.2   Recent Labs  Lab 11/22/2018 0355  LIPASE 24   No results for input(s): AMMONIA in the last 168 hours.  ABG    Component Value Date/Time   PHART 7.088 (LL) 11/26/2018 1258   PCO2ART 49.8 (H) 12/09/2018 1258   PO2ART 362 (H) 12/04/2018 1258   HCO3 14.4 (L) 12/11/2018 1258   TCO2 16 (L) 11/18/2018 1119   ACIDBASEDEF 15.2 (H) 11/19/2018 1258   O2SAT 98.9 11/24/2018 1258     Coagulation Profile: Recent Labs  Lab 12/09/2018 1048  INR 1.2    Cardiac Enzymes: No results for input(s): CKTOTAL, CKMB, CKMBINDEX, TROPONINI in the last 168 hours.  HbA1C: Hgb A1c  MFr Bld  Date/Time Value Ref Range Status  10/24/2013 06:25 AM 11.8 (H) <5.7 % Final    Comment:    (NOTE)                                                                       According to the ADA Clinical Practice Recommendations for 2011, when HbA1c is used as a screening test:  >=6.5%   Diagnostic of Diabetes Mellitus           (if abnormal result is confirmed) 5.7-6.4%   Increased risk of developing Diabetes Mellitus References:Diagnosis and Classification of Diabetes Mellitus,Diabetes MAYO,4599,77(SFSEL 1):S62-S69 and Standards of Medical Care in         Diabetes - 2011,Diabetes TRVU,0233,43 (Suppl 1):S11-S61.    CBG: Recent Labs  Lab 11/15/2018 1121 12/03/2018 1157  GLUCAP 357* 303*    Review of Systems:   Unable to obtain.  Past Medical History  She,  has a past medical history of Diabetes mellitus, type II ([place]), Hyperlipemia, Hypertension, Inflammatory breast cancer ([place]) (10/22/13), and Radiation (06/20/14-07/28/14).   Surgical History    Past Surgical History:  Procedure Laterality Date  . BREAST BIOPSY Right 10/22/2013   Procedure: BREAST BIOPSY;  Surgeon: [name], MD;  Location: [place];  Service: General;  Laterality: Right;  . CESAREAN SECTION     x 1  . CHOLECYSTECTOMY  2011  . DRESSING CHANGE UNDER ANESTHESIA Right 10/26/2013   Procedure: COMPLETION MASTECTOMY, AXILLARY DISSECTION;  Surgeon: [name], MD;  Location: [place];  Service: General;  Laterality: Right;  . INCISION AND DRAINAGE OF WOUND Right 10/22/2013   Procedure: IRRIGATION AND DRAINAGE AND DEBRIDEMENT RIGHT BREAST WOUND;  Surgeon: [name], MD;  Location: [place];  Service: General;  Laterality: Right;  . IRRIGATION AND DEBRIDEMENT ABSCESS Right 10/24/2013   Procedure: Right total mastectomy;  Surgeon: [name], MD;  Location: [place];  Service: General;  Laterality: Right;  . PORTACATH PLACEMENT N/A 11/16/2013   Procedure: INSERTION PORT-A-CATH;  Surgeon: [name], MD;   Location: [place];  Service: General;  Laterality: N/A;  . VENTRAL HERNIA REPAIR  2011   repair incacerated VH with biologic mesh; cholecystectomy     Social History  reports that she has never smoked. She has never used smokeless tobacco. She reports that she does not drink alcohol or use drugs.   Family History   Her family history includes Emphysema in her father; Lung cancer in her mother. There is no history of Colon cancer.   Allergies No Known Allergies   Home Medications  Prior to Admission medications   Medication Sig Start Date End Date Taking? Authorizing Provider  acetaminophen (TYLENOL) 325 MG tablet Take 650 mg by mouth every 6 (six) hours as needed for mild pain.    [provider]  dorzolamide-timolol (COSOPT) 22.3-6.8 MG/ML ophthalmic solution instill 1 drop into both eyes twice a day 11/14/16   [provider]  glucose blood test strip Use as instructed 11/07/13   Cristal Ford, DO  glucose monitoring kit (FREESTYLE) monitoring kit 1 each by Does not apply route as needed for other. Check blood sugars daily. 11/07/13   Mikhail, Velta Addison, DO  Insulin Glargine (LANTUS SOLOSTAR) 100 UNIT/ML Solostar Pen Inject 10 Units into the skin daily at 10 pm. 03/20/14   Bernadene Bell, MD  latanoprost (XALATAN) 0.005 % ophthalmic solution Place 1 drop into both eyes 2 (two) times daily. 05/09/16   [provider]  lidocaine-prilocaine (EMLA) cream Apply 1 application topically as needed. 04/24/14   Bernadene Bell, MD  lisinopril (PRINIVIL,ZESTRIL) 10 MG tablet Take 10 mg by mouth daily.  05/18/15   [provider]  LORazepam (ATIVAN) 0.5 MG tablet Take 1 tablet (0.5 mg total) by mouth at bedtime as needed (Nausea or vomiting). 01/09/14   Gardenia Phlegm, NP  metFORMIN (GLUCOPHAGE) 500 MG tablet Take 1,000 mg by mouth daily. 04/27/16   [provider]  pravastatin (PRAVACHOL) 10 MG tablet Take 10 mg by mouth daily.   05/18/15   [provider]  timolol (TIMOPTIC) 0.5 % ophthalmic solution instill 1 drop into both eyes every morning 02/21/16   [provider]     Critical care time: 54 minutes    Chesley Mires, MD [place] 12/05/2018, 3:22 PM

## 2018-11-21 NOTE — ED Triage Notes (Signed)
Per EMS, pt with c/o abd pain and N/V. Abd hard, swollen and distended. Pt actively vomiting upon arrival. Hx of hernia and hernia repair

## 2018-11-21 NOTE — ED Provider Notes (Addendum)
Assumed care from Dr. Dina Rich, concern for obstruction, has received fluids and zosyn for leukocytosis, multiple episodes of emesis here, firm abdomen, CT pending.  7:45 AM update: Upon my evaluation, patient continues to roll around the bed pain, heart rate newly elevated to the 140s, sinus tachycardia on EKG, normotensive.  Upon review of the CT imaging, there is concern for free air.  General surgery consulted.  Patient has had very little response to pain control with multiple doses of opioids, will trial single dose ketamine, provide further IV fluid resuscitation.  7:52 AM update: Called by radiology, confirmed closed-loop obstruction with perforation of the small bowel, awaiting general surgery callback.  7:59 AM update: Dr. Ninfa Linden of general surgery has been made aware, anticipating below her management.  Will obtain second line of IV access, provide third liter of crystalloid.  Patient is already received IV Zosyn, has achieved some analgesia with the single dose ketamine.  Negative for COVID-19.  8:30 AM update: Rediscussed with Dr. Ninfa Linden, patient has been posted emergently for the OR, will consult intensivist for admission postoperatively.  Family made aware of her critical condition and need for emergent surgery.   EKG Interpretation  Date/Time:  Sunday November 21 2018 07:13:52 EDT Ventricular Rate:  136 PR Interval:    QRS Duration: 85 QT Interval:  321 QTC Calculation: 483 R Axis:   0 Text Interpretation:  Sinus tachycardia Paired ventricular premature complexes Aberrant conduction of SV complex(es) LAE, consider biatrial enlargement Markedly posterior QRS axis Confirmed by Gerlene Fee [id number]) on 11/26/2018 8:04:41 AM       Critical Care Documentation Critical care time provided by me (excluding procedures): 32 minutes  Condition necessitating critical care: Incarcerated hernia with bowel perforation  Components of critical care management: reviewing of prior records,  laboratory and imaging interpretation, frequent re-examination and reassessment of vital signs, administration of IV fluids, discussion with consulting services.     Maudie Flakes, MD 11/20/2018 7824    Maudie Flakes, MD 11/15/2018 [phone]

## 2018-11-21 NOTE — Progress Notes (Signed)
PACU Nursing Note: Dr Gifford Shave notified of ABG results immediately, orders rec for ventilator changes and administration of Bicarb IV push. Changes to Levo gtt also made by MDA secondary to SBP 70's

## 2018-11-21 NOTE — Anesthesia Procedure Notes (Signed)
Central Venous Catheter Insertion Performed by: Catalina Gravel, MD, anesthesiologist Start/End06/30/2020 9:15 AM, 11/20/2018 9:25 AM Patient location: Pre-op. Preanesthetic checklist: patient identified, IV checked, site marked, risks and benefits discussed, surgical consent, monitors and equipment checked, pre-op evaluation, timeout performed and anesthesia consent Lidocaine 1% used for infiltration and patient sedated Hand hygiene performed  and maximum sterile barriers used  Catheter size: 8 Fr Total catheter length 16. Central line was placed.Double lumen Procedure performed using ultrasound guided technique. Ultrasound Notes:anatomy identified, needle tip was noted to be adjacent to the nerve/plexus identified, no ultrasound evidence of intravascular and/or intraneural injection and image(s) printed for medical record Attempts: 1 Following insertion, dressing applied and line sutured. Post procedure assessment: blood return through all ports, free fluid flow and no air  Patient tolerated the procedure well with no immediate complications.

## 2018-11-21 NOTE — ED Notes (Signed)
Patient wiped with chg wipes, the best of my ability

## 2018-11-21 NOTE — Anesthesia Procedure Notes (Signed)
Procedure Name: Intubation Date/Time: 11/17/2018 9:55 AM Performed by: Pilar Grammes, CRNA Pre-anesthesia Checklist: Patient identified, Emergency Drugs available, Suction available, Patient being monitored and Timeout performed Patient Re-evaluated:Patient Re-evaluated prior to induction Oxygen Delivery Method: Circle system utilized Preoxygenation: Pre-oxygenation with 100% oxygen Induction Type: IV induction Ventilation: Mask ventilation without difficulty Laryngoscope Size: 3 and Mac Grade View: Grade I Tube type: Oral Tube size: 7.5 mm Number of attempts: 1 Airway Equipment and Method: Stylet Placement Confirmation: positive ETCO2,  ETT inserted through vocal cords under direct vision,  CO2 detector and breath sounds checked- equal and bilateral Secured at: 24 cm Tube secured with: Tape Dental Injury: Teeth and Oropharynx as per pre-operative assessment

## 2018-11-21 NOTE — ED Notes (Signed)
Patient belongings have been bagged and tagged. CHG bath complete. IV's patent, infusing and IV in right wrist is saline locked. Consent has been given to Darla Lesches RN who is also transporting patient from ED to OR time now.

## 2018-11-21 NOTE — Progress Notes (Signed)
TIME OUT NOTE: Time Out performed with this RN and Dr. Gifford Shave for insertion of double lumen central line

## 2018-11-21 NOTE — ED Provider Notes (Signed)
[facility] DEPT Provider Note   CSN: [id number] Arrival date & time: 12/06/2018  0245    History   Chief Complaint Chief Complaint  Patient presents with  . Abdominal Pain    HPI Carmen Stone is a 63 y.o. female.     HPI  This is a 63 year old female with a history of diabetes, hypertension, hyperlipidemia, multiple abdominal surgeries and hernias who presents with abdominal pain and nausea and vomiting.  Patient reports symptoms since yesterday.  Reports worsening abdominal pain and distention.  Pain is rated at 10 out of 10.  Nothing seems to make it better or worse.  She did not take anything for her pain.  She reports multiple episodes of emesis.  She also reports ongoing bowel movements.  Denies constipation.  Denies fevers, chest pain, shortness of breath.  Past Medical History:  Diagnosis Date  . Diabetes mellitus, type II ([place])   . Hyperlipemia   . Hypertension   . 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 Radiation 06/20/14-07/28/14    Patient Active Problem List   Diagnosis Date Noted  . Port-A-Cath in place 08/03/2017  . Port catheter in place 01/31/2016  . Hypersensitivity reaction 02/08/2014  . Drug induced neutropenia(288.03) 01/30/2014  . Nausea with vomiting 11/04/2013  . 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 ATN (acute tubular necrosis) 11/02/2013  . Acute respiratory failure ([place]) 11/02/2013  . Acute respiratory failure with hypoxia ([place]) 10/25/2013  . Abnormal urinalysis 10/25/2013  . Postoperative anemia due to acute blood loss 10/24/2013  . Breast abscess of female 10/22/2013  . HYPERLIPIDEMIA 07/16/2010  . ATELECTASIS 07/16/2010  . Type 2 diabetes mellitus ([place]) 06/24/2010  . 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 HYPOXEMIA 06/24/2010  . SMALL BOWEL OBSTRUCTION, HX OF 06/24/2010    Past Surgical History:  Procedure Laterality Date  . BREAST BIOPSY Right 10/22/2013   Procedure: BREAST BIOPSY;   Surgeon: [name], MD;  Location: [facility];  Service: General;  Laterality: Right;  . CESAREAN SECTION     x 1  . CHOLECYSTECTOMY  2011  . DRESSING CHANGE UNDER ANESTHESIA Right 10/26/2013   Procedure: COMPLETION MASTECTOMY, AXILLARY DISSECTION;  Surgeon: [name], MD;  Location: [facility];  Service: General;  Laterality: Right;  . INCISION AND DRAINAGE OF WOUND Right 10/22/2013   Procedure: IRRIGATION AND DRAINAGE AND DEBRIDEMENT RIGHT BREAST WOUND;  Surgeon: [name], MD;  Location: [facility];  Service: General;  Laterality: Right;  . IRRIGATION AND DEBRIDEMENT ABSCESS Right 10/24/2013   Procedure: Right total mastectomy;  Surgeon: [name], MD;  Location: [facility];  Service: General;  Laterality: Right;  . PORTACATH PLACEMENT N/A 11/16/2013   Procedure: INSERTION PORT-A-CATH;  Surgeon: [name], MD;  Location: [facility];  Service: General;  Laterality: N/A;  . VENTRAL HERNIA REPAIR  2011   repair incacerated VH with biologic mesh; cholecystectomy     OB History   No obstetric history on file.      Home Medications    Prior to Admission medications   Medication Sig Start Date End Date Taking? Authorizing Provider  acetaminophen (TYLENOL) 325 MG tablet Take 650 mg by mouth every 6 (six) hours as needed for mild pain.    [provider]  dorzolamide-timolol (COSOPT) 22.3-6.8 MG/ML ophthalmic solution instill 1 drop into both eyes twice a day 11/14/16   [provider]  glucose blood test strip Use as instructed 11/07/13   Cristal Ford, DO  glucose monitoring kit (FREESTYLE) monitoring kit 1 each by Does not apply route as needed for other. Check blood sugars daily. 11/07/13   Mikhail, Velta Addison, DO  Insulin Glargine (LANTUS SOLOSTAR) 100 UNIT/ML Solostar Pen Inject 10 Units into the skin daily at 10 pm. 03/20/14   Bernadene Bell, MD  latanoprost (XALATAN) 0.005 % ophthalmic solution Place 1 drop into both eyes 2 (two) times daily. 05/09/16    [provider]  lidocaine-prilocaine (EMLA) cream Apply 1 application topically as needed. 04/24/14   Bernadene Bell, MD  lisinopril (PRINIVIL,ZESTRIL) 10 MG tablet Take 10 mg by mouth daily.  05/18/15   [provider]  LORazepam (ATIVAN) 0.5 MG tablet Take 1 tablet (0.5 mg total) by mouth at bedtime as needed (Nausea or vomiting). 01/09/14   Gardenia Phlegm, NP  metFORMIN (GLUCOPHAGE) 500 MG tablet Take 1,000 mg by mouth daily. 04/27/16   [provider]  pravastatin (PRAVACHOL) 10 MG tablet Take 10 mg by mouth daily.  05/18/15   [provider]  timolol (TIMOPTIC) 0.5 % ophthalmic solution instill 1 drop into both eyes every morning 02/21/16   [provider]    Family History Family History  Problem Relation Age of Onset  . Lung cancer Mother        smoker  . Emphysema Father        smoker  . Colon cancer Neg Hx     Social History Social History   Tobacco Use  . Smoking status: Never Smoker  . Smokeless tobacco: Never Used  Substance Use Topics  . Alcohol use: No    Alcohol/week: 0.0 standard drinks  . Drug use: No     Allergies   Patient has no known allergies.   Review of Systems Review of Systems  Constitutional: Negative for fever.  Respiratory: Negative for shortness of breath.   Cardiovascular: Negative for chest pain.  Gastrointestinal: Positive for abdominal pain, nausea and vomiting. Negative for constipation and diarrhea.  Genitourinary: Negative for dysuria.  Neurological: Negative for weakness.  All other systems reviewed and are negative.    Physical Exam Updated Vital Signs BP 130/74   Pulse (!) 124   Temp 98.2 F (36.8 C) (Oral)   Resp 20   Ht 1.626 m ('5\' 4"' )   Wt 74.8 kg   SpO2 97%   BMI 28.32 kg/m   Physical Exam Vitals signs and nursing note reviewed.  Constitutional:      General: She is not in acute distress.    Appearance: She is well-developed.     Comments: Uncomfortable but  nontoxic-appearing  HENT:     Head: Normocephalic and atraumatic.  Neck:     Musculoskeletal: Neck supple.  Cardiovascular:     Rate and Rhythm: Normal rate and regular rhythm.     Heart sounds: Normal heart sounds.  Pulmonary:     Effort: Pulmonary effort is normal. No respiratory distress.     Breath sounds: No wheezing.  Abdominal:     General: Bowel sounds are increased.     Palpations: There is mass.     Tenderness: There is abdominal tenderness.     Hernia: A hernia is present. Hernia is present in the ventral area.     Comments: Tenderness palpation over large ventral hernia, hernia is tense and nonreducible, no overlying skin changes  Skin:    General: Skin is warm and dry.  Neurological:     Mental Status: She is alert and oriented to person, place,  and time.  Psychiatric:        Mood and Affect: Mood normal.      ED Treatments / Results  Labs (all labs ordered are listed, but only abnormal results are displayed) Labs Reviewed  CBC WITH DIFFERENTIAL/PLATELET - Abnormal; Notable for the following components:      Result Value   WBC 22.3 (*)    Neutro Abs 19.6 (*)    Abs Immature Granulocytes 0.15 (*)    All other components within normal limits  COMPREHENSIVE METABOLIC PANEL - Abnormal; Notable for the following components:   Glucose, Bld 495 (*)    All other components within normal limits  SARS CORONAVIRUS 2 (HOSPITAL ORDER, [facility])  LIPASE, BLOOD  URINALYSIS, ROUTINE W REFLEX MICROSCOPIC    EKG EKG Interpretation  Date/Time:  Sunday November 21 2018 04:12:45 EDT Ventricular Rate:  93 PR Interval:    QRS Duration: 87 QT Interval:  381 QTC Calculation: 474 R Axis:   21 Text Interpretation:  Sinus rhythm Probable left atrial enlargement Confirmed by Thayer Jew [phone]) on 11/15/2018 5:55:47 AM   Radiology Dg Abdomen Acute W/chest  Result Date: 12/12/2018 CLINICAL DATA:  Abdominal pain, nausea and vomiting. EXAM: DG  ABDOMEN ACUTE W/ 1V CHEST COMPARISON:  Chest radiograph 11/16/2013 FINDINGS: Left anterior chest wall Port-A-Cath is present with tip projecting over the superior vena cava. Monitoring leads overlie the patient. Stable cardiac and mediastinal contours. Low lung volumes with bibasilar atelectasis. No large area pulmonary consolidation. Large amount of stool throughout the colon. Multiple gaseous distended loops of bowel throughout the central abdomen. No definite free intraperitoneal air. Lumbar spine degenerative changes. IMPRESSION: Multiple gaseous distended loops of bowel throughout the abdomen raising the possibility of ileus or possible early bowel obstruction. Large amount of stool throughout the colon as can be seen with constipation. Electronically Signed   By: Lovey Newcomer M.D.   On: 12/07/2018 05:42    Procedures Procedures (including critical care time)  Medications Ordered in ED Medications  iohexol (OMNIPAQUE) 300 MG/ML solution 100 mL (has no administration in time range)  sodium chloride (PF) 0.9 % injection (has no administration in time range)  morphine 4 MG/ML injection 4 mg (4 mg Intravenous Given 11/26/2018 0413)  ondansetron (ZOFRAN) injection 4 mg (4 mg Intravenous Given 11/30/2018 0413)  ondansetron (ZOFRAN) injection 4 mg (4 mg Intravenous Given 12/12/2018 0527)  morphine 4 MG/ML injection 4 mg (4 mg Intravenous Given 11/17/2018 0527)  sodium chloride 0.9 % bolus 1,000 mL (1,000 mLs Intravenous New Bag/Given 11/20/2018 0527)  piperacillin-tazobactam (ZOSYN) IVPB 3.375 g (0 g Intravenous Stopped 12/06/2018 0626)     Initial Impression / Assessment and Plan / ED Course  I have reviewed the triage vital signs and the nursing notes.  Pertinent labs & imaging results that were available during my care of the patient were reviewed by me and considered in my medical decision making (see chart for details).        Patient presents with abdominal pain, nausea, vomiting.  Acutely uncomfortable  appearing but nontoxic.  She has a large ventral hernia which is hard and difficult to reduce although the patient is in pain.  She was given pain and nausea medication.  She has had multiple episodes of emesis while in the emergency department.  Lab work notable for white count of 22.3.  X-ray concerning for some degree of ileus versus obstruction.  Given the white count, patient was covered with Zosyn.  Her  other lab work-up is otherwise reassuring.  Patient signed out pending CT scan.  Final Clinical Impressions(s) / ED Diagnoses   Final diagnoses:  None    ED Discharge Orders    None       Eugina Row, Barbette Hair, MD 11/30/2018 [phone]

## 2018-11-21 NOTE — Transfer of Care (Signed)
Immediate Anesthesia Transfer of Care Note  Patient: PAXTYN WISDOM  Procedure(s) Performed: EXPLORATORY LAPAROTOMY (N/A Abdomen)  Patient Location: PACU  Anesthesia Type:General  Level of Consciousness: Patient remains intubated per anesthesia plan  Airway & Oxygen Therapy: Patient remains intubated per anesthesia plan and Patient placed on Ventilator (see vital sign flow sheet for setting)  Post-op Assessment: Report given to RN and Post -op Vital signs reviewed and stable  Post vital signs: stable  Last Vitals:  Vitals Value Taken Time  BP    Temp    Pulse 135 12/03/2018 11:48 AM  Resp 18 12/04/2018 11:48 AM  SpO2 98 % 12/07/2018 11:48 AM  Vitals shown include unvalidated device data.  Last Pain:  Vitals:   12/05/2018 0910  TempSrc:   PainSc: 10-Worst pain ever         Complications: No apparent anesthesia complications

## 2018-11-21 NOTE — Anesthesia Preprocedure Evaluation (Addendum)
Anesthesia Evaluation  Patient identified by MRN, date of birth, ID band Patient confused  General Assessment Comment:Patient minimally responsive  Reviewed: Allergy & Precautions, NPO status , Patient's Chart, lab work & pertinent test results, Unable to perform ROS - Chart review onlyPreop documentation limited or incomplete due to emergent nature of procedure.  Airway Mallampati: II  TM Distance: >3 FB Neck ROM: Full    Dental  (+) Dental Advisory Given, Edentulous Upper, Edentulous Lower   Pulmonary neg pulmonary ROS,    Pulmonary exam normal breath sounds clear to auscultation       Cardiovascular hypertension, Pt. on medications  Rhythm:Regular Rate:Tachycardia     Neuro/Psych negative neurological ROS  negative psych ROS   GI/Hepatic Neg liver ROS, ruptured bowel She has perforated small bowel with a large amount of free air as well as multiple incarcerated incisional hernias.    Endo/Other  diabetes, Type 2, Insulin Dependent, Oral Hypoglycemic AgentsCBG 495  Renal/GU Renal disease     Musculoskeletal negative musculoskeletal ROS (+)   Abdominal   Peds  Hematology negative hematology ROS (+)   Anesthesia Other Findings Day of surgery medications reviewed with the patient.  Inflammatory breast cancer s/p chemo/radiation  Reproductive/Obstetrics                            Anesthesia Physical Anesthesia Plan  ASA: V and emergent  Anesthesia Plan: General   Post-op Pain Management:    Induction: Intravenous and Rapid sequence  PONV Risk Score and Plan: 4 or greater and Ondansetron, Treatment may vary due to age or medical condition and Propofol infusion  Airway Management Planned: Oral ETT  Additional Equipment: Arterial line and CVP  Intra-op Plan:   Post-operative Plan: Possible Post-op intubation/ventilation  Informed Consent: I have reviewed the patients History and  Physical, chart, labs and discussed the procedure including the risks, benefits and alternatives for the proposed anesthesia with the patient or authorized representative who has indicated his/her understanding and acceptance.     Dental advisory given, Only emergency history available and History available from chart only  Plan Discussed with: CRNA  Anesthesia Plan Comments:         Anesthesia Quick Evaluation

## 2018-11-21 NOTE — Op Note (Signed)
Carmen Stone 12/05/2018   Pre-op Diagnosis: Perforated viscus     Post-op Diagnosis: Perforated small bowel from closed-loop obstruction  Procedure(s): EXPLORATORY LAPAROTOMY SMALL BOWEL RESECTION LYSIS OF ADHESIONS (1.5 HOURS) PLACEMENT OF WOUND VAC  Surgeon(s): Coralie Keens, MD Ralene Ok, MD  Anesthesia: General  Staff:  Circulator: Rickard Rhymes, RN Scrub Person: Allen Norris, CST  Estimated Blood Loss: Minimal               Specimens: sent to path  Indications: This is a 63 year old female who presented with acute abdominal pain.  She underwent a CAT scan of the abdomen and pelvis showing multiple ventral hernias containing small and large bowel.  There was obvious perforation of small bowel and a closed-loop obstruction intra-abdominally with a large amount of free air.  The decision was made to proceed emergently to the operating room  Findings: The patient was found to have a closed-loop small bowel obstruction with a large area of perforation and intra-abdominal contamination.  She had extensive adhesions necessitating 1-1/2 hours of lysis of adhesions.  She had multiple fascial defects with multiple areas of herniated small and large bowel which were reduced.  The retroperitoneum and the right colon are fairly dusky in appearance.  The decision was made after resecting small bowel to not perform an anastomosis and to place a wound VAC and take the patient to the ICU for further resuscitation with planned reexploration in approximately 48 hours.  Procedure: The patient was brought to the operating room and identified the correct patient.  She was placed upon the operating room table and general anesthesia was induced.  Her abdomen was then prepped and draped in the usual sterile fashion.  The patient had a large distended abdomen with multiple areas of herniation.  I made incision through the previous scar with a scalpel.  I took  this down through the fascial defects and fascia with electrocautery.  A small colotomy was created which was repaired with 2 separate 2-0 silk sutures.  The patient was found to have dense adhesions throughout the abdomen.  Extensive lysis of adhesions was carried out to free up the small large bowel and the multiple fascial defects and hernias.  I could finally identify the perforated small bowel in the depths of the pelvis from a closed-loop bowel obstruction.  Again, there extends adhesions throughout the abdomen.  After extensive lysis of adhesions, we were able to then run the small bowel from the ligament of Treitz to the terminal ileum.  There was extensive reaction in the small bowel mesentery from the perforation as well as duskiness in both sides of the retroperitoneum.  The right colon also appeared dusky but viable.  At this point, the small bowel was transected with a GIA 75 stapler proximal and distal to the point of perforation and ischemia.  Approximately 1-1/2 feet of small bowel was resected with the mesentery being taken down with the LigaSure device and 2-0 silk sutures.  The specimen was then sent to pathology for evaluation.  At this point, given the extensive contamination, the patient's hypertension, and the duskiness several areas of bowel, the decision was made to forego a re-anastomosis in an attempt to closing the abdomen.  We copiously irrigated the abdomen with several liters normal saline.  We loosely approximated the 2 ends of small bowel together with a silk suture for identification purposes.  Hemostasis appeared to be achieved.  A wound VAC was then  applied into the open abdomen and placed to suction.  The patient was then taken in a guarded condition still intubated to the intensive care unit.  All counts were correct at the end of the procedure.          Coralie Keens   Date: 12/01/2018  Time: 11:41 AM

## 2018-11-21 NOTE — Progress Notes (Signed)
eLink Physician-Brief Progress Note Patient Name: Carmen Stone DOB: 01-05-1956 MRN: [id number]   Date of Service  12/03/2018  HPI/Events of Note  Hypotension BP = 77/47 on Norepinephrine IV infusion at 50 mcg/min. Random Cortisol level drawn and is in process.   eICU Interventions  Will order: 1. Monitor CVP now and Q 4 hours.  2. Increase ceiling dose of Norepinephrine IV infusion to 70 mcg/min. 3. ABG STAT.  5. Stress dose Hydrocortisone 50 mcg IV now and Q 6 hours.      Intervention Category Major Interventions: Hypotension - evaluation and management  Avrian Delfavero [place] 11/22/2018, 8:16 PM

## 2018-11-21 NOTE — Progress Notes (Signed)
PACU NSG NOTE: Family member at bedside with nursing supervisor, [facility]). AC spoke with family member, explained current nursing care being provided. Opportunity for questions provided.

## 2018-11-21 NOTE — ED Notes (Signed)
Bed: XB35 Expected date:  Expected time:  Means of arrival:  Comments: EMS 63 yo female from home vomiting with multiple hernias

## 2018-11-21 NOTE — Progress Notes (Signed)
PreOp Nursing Note: Report given to CRNA, pt to OR

## 2018-11-21 NOTE — ED Notes (Signed)
Pts brother called for an update. Pt gave permission for brother to be provided information. Brother will call back later for an update once images come back.

## 2018-11-21 NOTE — Progress Notes (Signed)
[facility] Progress Note Patient Name: Carmen Stone DOB: 06-Nov-1955 MRN: [id number]   Date of Service  12/03/2018  HPI/Events of Note  Nursing reports that wound vac drainage is bloody.   eICU Interventions  Will order: 1. CBC with platelets, PT/INR and PTT now.  2. Will ask nursing to inform the surgeon of bloody wound vac drainage.      Intervention Category Major Interventions: Other:  Lysle Dingwall 11/16/2018, 11:27 PM

## 2018-11-22 ENCOUNTER — Encounter (HOSPITAL_COMMUNITY): Payer: Self-pay | Admitting: Surgery

## 2018-11-22 ENCOUNTER — Inpatient Hospital Stay (HOSPITAL_COMMUNITY): Payer: BLUE CROSS/BLUE SHIELD

## 2018-11-22 DIAGNOSIS — R9431 Abnormal electrocardiogram [ECG] [EKG]: Secondary | ICD-10-CM

## 2018-11-22 DIAGNOSIS — K55029 Acute infarction of small intestine, extent unspecified: Secondary | ICD-10-CM

## 2018-11-22 LAB — CBC WITH DIFFERENTIAL/PLATELET
Abs Immature Granulocytes: 0.06 10*3/uL (ref 0.00–0.07)
Abs Immature Granulocytes: 0.68 10*3/uL — ABNORMAL HIGH (ref 0.00–0.07)
Basophils Absolute: 0 10*3/uL (ref 0.0–0.1)
Basophils Absolute: 0 10*3/uL (ref 0.0–0.1)
Basophils Relative: 0 %
Basophils Relative: 0 %
Eosinophils Absolute: 0 10*3/uL (ref 0.0–0.5)
Eosinophils Absolute: 0 10*3/uL (ref 0.0–0.5)
Eosinophils Relative: 0 %
Eosinophils Relative: 0 %
HCT: 30.3 % — ABNORMAL LOW (ref 36.0–46.0)
HCT: 40.5 % (ref 36.0–46.0)
Hemoglobin: 13.1 g/dL (ref 12.0–15.0)
Hemoglobin: 9.8 g/dL — ABNORMAL LOW (ref 12.0–15.0)
Immature Granulocytes: 1 %
Immature Granulocytes: 7 %
Lymphocytes Relative: 15 %
Lymphocytes Relative: 17 %
Lymphs Abs: 0.8 10*3/uL (ref 0.7–4.0)
Lymphs Abs: 1.4 10*3/uL (ref 0.7–4.0)
MCH: 30.1 pg (ref 26.0–34.0)
MCH: 30.8 pg (ref 26.0–34.0)
MCHC: 32.3 g/dL (ref 30.0–36.0)
MCHC: 32.3 g/dL (ref 30.0–36.0)
MCV: 93.1 fL (ref 80.0–100.0)
MCV: 95.3 fL (ref 80.0–100.0)
Monocytes Absolute: 0.2 10*3/uL (ref 0.1–1.0)
Monocytes Absolute: 0.7 10*3/uL (ref 0.1–1.0)
Monocytes Relative: 5 %
Monocytes Relative: 7 %
Neutro Abs: 3.8 10*3/uL (ref 1.7–7.7)
Neutro Abs: 6.6 10*3/uL (ref 1.7–7.7)
Neutrophils Relative %: 71 %
Neutrophils Relative %: 77 %
Platelets: 69 10*3/uL — ABNORMAL LOW (ref 150–400)
Platelets: 95 10*3/uL — ABNORMAL LOW (ref 150–400)
RBC: 3.18 MIL/uL — ABNORMAL LOW (ref 3.87–5.11)
RBC: 4.35 MIL/uL (ref 3.87–5.11)
RDW: 13.1 % (ref 11.5–15.5)
RDW: 14.2 % (ref 11.5–15.5)
WBC: 5 10*3/uL (ref 4.0–10.5)
WBC: 9.4 10*3/uL (ref 4.0–10.5)
nRBC: 0.2 % (ref 0.0–0.2)
nRBC: 0.4 % — ABNORMAL HIGH (ref 0.0–0.2)

## 2018-11-22 LAB — POCT I-STAT 7, (LYTES, BLD GAS, ICA,H+H)
Acid-base deficit: 14 mmol/L — ABNORMAL HIGH (ref 0.0–2.0)
Bicarbonate: 16.6 mmol/L — ABNORMAL LOW (ref 20.0–28.0)
Calcium, Ion: 1.12 mmol/L — ABNORMAL LOW (ref 1.15–1.40)
HCT: 41 % (ref 36.0–46.0)
Hemoglobin: 13.9 g/dL (ref 12.0–15.0)
O2 Saturation: 100 %
Patient temperature: 36
Potassium: 3.7 mmol/L (ref 3.5–5.1)
Sodium: 146 mmol/L — ABNORMAL HIGH (ref 135–145)
TCO2: 18 mmol/L — ABNORMAL LOW (ref 22–32)
pCO2 arterial: 55.7 mmHg — ABNORMAL HIGH (ref 32.0–48.0)
pH, Arterial: 7.076 — CL (ref 7.350–7.450)
pO2, Arterial: 512 mmHg — ABNORMAL HIGH (ref 83.0–108.0)

## 2018-11-22 LAB — BLOOD GAS, ARTERIAL
Acid-base deficit: 8 mmol/L — ABNORMAL HIGH (ref 0.0–2.0)
Acid-base deficit: 9.1 mmol/L — ABNORMAL HIGH (ref 0.0–2.0)
Acid-base deficit: 15.9 mmol/L — ABNORMAL HIGH (ref 0.0–2.0)
Bicarbonate: 16.9 mmol/L — ABNORMAL LOW (ref 20.0–28.0)
Bicarbonate: 16.2 mmol/L — ABNORMAL LOW (ref 20.0–28.0)
Bicarbonate: 11.2 mmol/L — ABNORMAL LOW (ref 20.0–28.0)
Drawn by: 514251
Drawn by: 514251
Drawn by: 225631
FIO2: 40
FIO2: 40
FIO2: 40
MECHVT: 430 mL
MECHVT: 430 mL
MECHVT: 0.43 mL
O2 Saturation: 95.8 %
O2 Saturation: 95 %
O2 Saturation: 95.7 %
PEEP: 5 cmH2O
PEEP: 5 cmH2O
PEEP: 5 cmH2O
Patient temperature: 102
Patient temperature: 98.9
Patient temperature: 98.3
RATE: 22 resp/min
RATE: 22 resp/min
RATE: 26 resp/min
pCO2 arterial: 37.6 mmHg (ref 32.0–48.0)
pCO2 arterial: 35.2 mmHg (ref 32.0–48.0)
pCO2 arterial: 30.5 mmHg — ABNORMAL LOW (ref 32.0–48.0)
pH, Arterial: 7.288 — ABNORMAL LOW (ref 7.350–7.450)
pH, Arterial: 7.287 — ABNORMAL LOW (ref 7.350–7.450)
pH, Arterial: 7.19 — CL (ref 7.350–7.450)
pO2, Arterial: 92.1 mmHg (ref 83.0–108.0)
pO2, Arterial: 79.2 mmHg — ABNORMAL LOW (ref 83.0–108.0)
pO2, Arterial: 95.4 mmHg (ref 83.0–108.0)

## 2018-11-22 LAB — ECHOCARDIOGRAM LIMITED
Height: 64 in
Weight: 2640 oz

## 2018-11-22 LAB — GLUCOSE, CAPILLARY
Glucose-Capillary: 101 mg/dL — ABNORMAL HIGH (ref 70–99)
Glucose-Capillary: 114 mg/dL — ABNORMAL HIGH (ref 70–99)
Glucose-Capillary: 123 mg/dL — ABNORMAL HIGH (ref 70–99)
Glucose-Capillary: 125 mg/dL — ABNORMAL HIGH (ref 70–99)
Glucose-Capillary: 127 mg/dL — ABNORMAL HIGH (ref 70–99)
Glucose-Capillary: 127 mg/dL — ABNORMAL HIGH (ref 70–99)
Glucose-Capillary: 128 mg/dL — ABNORMAL HIGH (ref 70–99)
Glucose-Capillary: 136 mg/dL — ABNORMAL HIGH (ref 70–99)
Glucose-Capillary: 137 mg/dL — ABNORMAL HIGH (ref 70–99)
Glucose-Capillary: 137 mg/dL — ABNORMAL HIGH (ref 70–99)
Glucose-Capillary: 167 mg/dL — ABNORMAL HIGH (ref 70–99)
Glucose-Capillary: 180 mg/dL — ABNORMAL HIGH (ref 70–99)
Glucose-Capillary: 269 mg/dL — ABNORMAL HIGH (ref 70–99)

## 2018-11-22 LAB — BASIC METABOLIC PANEL
Anion gap: 4 — ABNORMAL LOW (ref 5–15)
Anion gap: 12 (ref 5–15)
BUN: 24 mg/dL — ABNORMAL HIGH (ref 8–23)
BUN: 30 mg/dL — ABNORMAL HIGH (ref 8–23)
CO2: 19 mmol/L — ABNORMAL LOW (ref 22–32)
CO2: 13 mmol/L — ABNORMAL LOW (ref 22–32)
Calcium: 5.9 mg/dL — CL (ref 8.9–10.3)
Calcium: 5.9 mg/dL — CL (ref 8.9–10.3)
Chloride: 122 mmol/L — ABNORMAL HIGH (ref 98–111)
Chloride: 114 mmol/L — ABNORMAL HIGH (ref 98–111)
Creatinine, Ser: 1.71 mg/dL — ABNORMAL HIGH (ref 0.44–1.00)
Creatinine, Ser: 2.62 mg/dL — ABNORMAL HIGH (ref 0.44–1.00)
GFR calc Af Amer: 36 mL/min — ABNORMAL LOW (ref >60)
GFR calc Af Amer: 22 mL/min — ABNORMAL LOW (ref >60)
GFR calc non Af Amer: 31 mL/min — ABNORMAL LOW (ref >60)
GFR calc non Af Amer: 19 mL/min — ABNORMAL LOW (ref >60)
Glucose, Bld: 145 mg/dL — ABNORMAL HIGH (ref 70–99)
Glucose, Bld: 312 mg/dL — ABNORMAL HIGH (ref 70–99)
Potassium: 5 mmol/L (ref 3.5–5.1)
Potassium: 5.3 mmol/L — ABNORMAL HIGH (ref 3.5–5.1)
Sodium: 145 mmol/L (ref 135–145)
Sodium: 139 mmol/L (ref 135–145)

## 2018-11-22 LAB — PROTIME-INR
INR: 1.4 — ABNORMAL HIGH (ref 0.8–1.2)
Prothrombin Time: 17 seconds — ABNORMAL HIGH (ref 11.4–15.2)

## 2018-11-22 LAB — TSH: TSH: 5.067 u[IU]/mL — ABNORMAL HIGH (ref 0.350–4.500)

## 2018-11-22 LAB — ABO/RH: ABO/RH(D): O POS

## 2018-11-22 LAB — MAGNESIUM
Magnesium: 1.1 mg/dL — ABNORMAL LOW (ref 1.7–2.4)
Magnesium: 1.7 mg/dL (ref 1.7–2.4)

## 2018-11-22 LAB — PHOSPHORUS: Phosphorus: 2.6 mg/dL (ref 2.5–4.6)

## 2018-11-22 LAB — PREPARE RBC (CROSSMATCH)

## 2018-11-22 LAB — APTT: aPTT: 38 seconds — ABNORMAL HIGH (ref 24–36)

## 2018-11-22 LAB — TRIGLYCERIDES: Triglycerides: 46 mg/dL (ref <150)

## 2018-11-22 LAB — MRSA PCR SCREENING: MRSA by PCR: NEGATIVE

## 2018-11-22 MED ORDER — CHLORHEXIDINE GLUCONATE CLOTH 2 % EX PADS
6.0000 | MEDICATED_PAD | Freq: Every day | CUTANEOUS | Status: DC
  Administered 2018-11-22 – 2019-01-08 (×49): 6 via TOPICAL

## 2018-11-22 MED ORDER — ORAL CARE MOUTH RINSE
15.0000 mL | OROMUCOSAL | Status: DC
  Administered 2018-11-22 – 2018-12-03 (×110): 15 mL via OROMUCOSAL

## 2018-11-22 MED ORDER — CALCIUM GLUCONATE-NACL 1-0.675 GM/50ML-% IV SOLN
1.0000 g | Freq: Once | INTRAVENOUS | Status: AC
  Administered 2018-11-22: 1000 mg via INTRAVENOUS
  Filled 2018-11-22: qty 50

## 2018-11-22 MED ORDER — CHLORHEXIDINE GLUCONATE 0.12% ORAL RINSE (MEDLINE KIT)
15.0000 mL | Freq: Two times a day (BID) | OROMUCOSAL | Status: DC
  Administered 2018-11-22 – 2018-12-03 (×22): 15 mL via OROMUCOSAL

## 2018-11-22 MED ORDER — SODIUM CHLORIDE 0.9% IV SOLUTION
Freq: Once | INTRAVENOUS | Status: AC
  Administered 2018-11-22: 03:00:00 via INTRAVENOUS

## 2018-11-22 MED ORDER — MAGNESIUM SULFATE 2 GM/50ML IV SOLN
2.0000 g | Freq: Once | INTRAVENOUS | Status: AC
  Administered 2018-11-22: 07:00:00 2 g via INTRAVENOUS
  Filled 2018-11-22: qty 50

## 2018-11-22 MED ORDER — INSULIN ASPART 100 UNIT/ML ~~LOC~~ SOLN: 1.0000 [IU] | SUBCUTANEOUS | Status: DC

## 2018-11-22 MED ORDER — ACETAMINOPHEN 650 MG RE SUPP
650.0000 mg | Freq: Four times a day (QID) | RECTAL | Status: DC | PRN
  Administered 2018-11-23 – 2018-12-06 (×2): 650 mg via RECTAL
  Filled 2018-11-22 (×3): qty 1

## 2018-11-22 MED ORDER — DEXTROSE-NACL 5-0.45 % IV SOLN
INTRAVENOUS | Status: DC
  Administered 2018-11-22 (×2): via INTRAVENOUS

## 2018-11-22 MED ORDER — SODIUM CHLORIDE 0.9 % IV SOLN
0.0000 ug/min | INTRAVENOUS | Status: DC
  Administered 2018-11-22: 09:00:00 360 ug/min via INTRAVENOUS
  Administered 2018-11-22: 05:00:00 400 ug/min via INTRAVENOUS
  Administered 2018-11-22: 280 ug/min via INTRAVENOUS
  Administered 2018-11-22: 01:00:00 400 ug/min via INTRAVENOUS
  Administered 2018-11-22: 380 ug/min via INTRAVENOUS
  Administered 2018-11-22: 270 ug/min via INTRAVENOUS
  Administered 2018-11-22: 03:00:00 400 ug/min via INTRAVENOUS
  Filled 2018-11-22 (×10): qty 4

## 2018-11-22 MED ORDER — INSULIN DETEMIR 100 UNIT/ML ~~LOC~~ SOLN: 5.0000 [IU] | Freq: Two times a day (BID) | SUBCUTANEOUS | Status: DC

## 2018-11-22 MED ORDER — INSULIN ASPART 100 UNIT/ML ~~LOC~~ SOLN
3.0000 [IU] | SUBCUTANEOUS | Status: DC
  Administered 2018-11-22 (×3): 3 [IU] via SUBCUTANEOUS
  Administered 2018-11-22 – 2018-11-24 (×2): 6 [IU] via SUBCUTANEOUS
  Administered 2018-11-25: 3 [IU] via SUBCUTANEOUS
  Administered 2018-11-25 (×2): 6 [IU] via SUBCUTANEOUS
  Administered 2018-11-25: 3 [IU] via SUBCUTANEOUS
  Administered 2018-11-25 – 2018-11-26 (×3): 6 [IU] via SUBCUTANEOUS
  Administered 2018-11-26 – 2018-11-27 (×7): 3 [IU] via SUBCUTANEOUS
  Administered 2018-11-28: 6 [IU] via SUBCUTANEOUS
  Administered 2018-11-28: 4 [IU] via SUBCUTANEOUS
  Administered 2018-11-28 – 2018-11-30 (×8): 3 [IU] via SUBCUTANEOUS
  Administered 2018-11-30: 23:00:00 6 [IU] via SUBCUTANEOUS
  Administered 2018-11-30 – 2018-12-01 (×3): 9 [IU] via SUBCUTANEOUS
  Administered 2018-12-01: 12:00:00 3 [IU] via SUBCUTANEOUS
  Administered 2018-12-01: 03:00:00 6 [IU] via SUBCUTANEOUS
  Administered 2018-12-01 (×2): 9 [IU] via SUBCUTANEOUS
  Administered 2018-12-02 (×2): 6 [IU] via SUBCUTANEOUS
  Administered 2018-12-02: 20:00:00 9 [IU] via SUBCUTANEOUS
  Administered 2018-12-02 – 2018-12-03 (×3): 6 [IU] via SUBCUTANEOUS
  Administered 2018-12-03: 9 [IU] via SUBCUTANEOUS
  Administered 2018-12-03: 21:00:00 3 [IU] via SUBCUTANEOUS
  Administered 2018-12-03 (×3): 6 [IU] via SUBCUTANEOUS
  Administered 2018-12-04: 11:00:00 3 [IU] via SUBCUTANEOUS
  Administered 2018-12-04 (×2): 6 [IU] via SUBCUTANEOUS
  Administered 2018-12-05 (×3): 3 [IU] via SUBCUTANEOUS
  Administered 2018-12-06: 20:00:00 6 [IU] via SUBCUTANEOUS
  Administered 2018-12-07: 21:00:00 3 [IU] via SUBCUTANEOUS
  Administered 2018-12-07 (×2): 6 [IU] via SUBCUTANEOUS
  Administered 2018-12-07 (×3): 3 [IU] via SUBCUTANEOUS

## 2018-11-22 MED ORDER — VANCOMYCIN HCL 10 G IV SOLR: 1500.0000 mg | INTRAVENOUS | Status: DC

## 2018-11-22 MED ORDER — PHENYLEPHRINE HCL-NACL 40-0.9 MG/250ML-% IV SOLN
0.0000 ug/min | INTRAVENOUS | Status: DC
  Administered 2018-11-22 (×2): 270 ug/min via INTRAVENOUS
  Administered 2018-11-23: 18:00:00 190 ug/min via INTRAVENOUS
  Administered 2018-11-23 (×2): 250 ug/min via INTRAVENOUS
  Administered 2018-11-23: 180 ug/min via INTRAVENOUS
  Administered 2018-11-23: 05:00:00 270 ug/min via INTRAVENOUS
  Administered 2018-11-23: 14:00:00 200 ug/min via INTRAVENOUS
  Administered 2018-11-23: 01:00:00 270 ug/min via INTRAVENOUS
  Administered 2018-11-24: 140 ug/min via INTRAVENOUS
  Administered 2018-11-24: 180 ug/min via INTRAVENOUS
  Administered 2018-11-25: 20 ug/min via INTRAVENOUS
  Filled 2018-11-22 (×15): qty 250

## 2018-11-22 MED ORDER — CALCIUM GLUCONATE-NACL 2-0.675 GM/100ML-% IV SOLN
2.0000 g | Freq: Once | INTRAVENOUS | Status: AC
  Administered 2018-11-22: 2000 mg via INTRAVENOUS
  Filled 2018-11-22: qty 100

## 2018-11-22 MED ORDER — ACETAMINOPHEN 500 MG PO TABS
1000.0000 mg | ORAL_TABLET | ORAL | Status: DC
  Filled 2018-11-22: qty 2

## 2018-11-22 MED ORDER — SODIUM BICARBONATE 8.4 % IV SOLN
INTRAVENOUS | Status: DC
  Administered 2018-11-22 – 2018-11-23 (×2): via INTRAVENOUS
  Administered 2018-11-23: 17:00:00 100 mL via INTRAVENOUS
  Administered 2018-11-23: 16:00:00 50 mL via INTRAVENOUS
  Administered 2018-11-23 – 2018-11-24 (×2): via INTRAVENOUS
  Filled 2018-11-22 (×4): qty 150

## 2018-11-22 MED ORDER — SODIUM CHLORIDE 0.9 % IV SOLN
INTRAVENOUS | Status: DC | PRN
  Administered 2018-11-22: 18:00:00 250 mL via INTRAVENOUS
  Administered 2018-11-23: 16:00:00 via INTRAVENOUS
  Administered 2018-11-23: 250 mL via INTRAVENOUS
  Administered 2018-11-23: 16:00:00 via INTRAVENOUS
  Administered 2018-11-24 – 2018-12-09 (×5): 250 mL via INTRAVENOUS
  Administered 2018-12-10: 500 mL via INTRAVENOUS
  Administered 2018-12-10: 12:00:00 250 mL via INTRAVENOUS
  Administered 2018-12-10: 08:00:00 via INTRAVENOUS

## 2018-11-22 MED ORDER — SODIUM CHLORIDE 0.9% IV SOLUTION
Freq: Once | INTRAVENOUS | Status: AC
  Administered 2018-11-22: 01:00:00 via INTRAVENOUS

## 2018-11-22 MED ORDER — SODIUM BICARBONATE 8.4 % IV SOLN
100.0000 meq | Freq: Once | INTRAVENOUS | Status: AC
  Administered 2018-11-22: 22:00:00 100 meq via INTRAVENOUS
  Filled 2018-11-22: qty 50

## 2018-11-22 MED ORDER — VANCOMYCIN HCL 10 G IV SOLR
1500.0000 mg | Freq: Once | INTRAVENOUS | Status: AC
  Administered 2018-11-22: 01:00:00 1500 mg via INTRAVENOUS
  Filled 2018-11-22: qty 1500

## 2018-11-22 MED ORDER — SODIUM BICARBONATE 8.4 % IV SOLN
INTRAVENOUS | Status: AC
  Filled 2018-11-22: qty 50

## 2018-11-22 MED ORDER — NOREPINEPHRINE 16 MG/250ML-% IV SOLN
2.0000 ug/min | INTRAVENOUS | Status: DC
  Administered 2018-11-23 (×2): 65 ug/min via INTRAVENOUS
  Administered 2018-11-23: 22:00:00 40 ug/min via INTRAVENOUS
  Administered 2018-11-23: 02:00:00 65 ug/min via INTRAVENOUS
  Administered 2018-11-23: 14:00:00 63 ug/min via INTRAVENOUS
  Administered 2018-11-24: 37 ug/min via INTRAVENOUS
  Administered 2018-11-24: 39 ug/min via INTRAVENOUS
  Administered 2018-11-25: 35 ug/min via INTRAVENOUS
  Administered 2018-11-25: 20 ug/min via INTRAVENOUS
  Administered 2018-11-26 – 2018-11-27 (×2): 10 ug/min via INTRAVENOUS
  Administered 2018-11-28: 2 ug/min via INTRAVENOUS
  Administered 2018-11-28: 1 ug/min via INTRAVENOUS
  Administered 2018-12-01 – 2018-12-05 (×3): 2 ug/min via INTRAVENOUS
  Filled 2018-11-22 (×18): qty 250

## 2018-11-22 MED ORDER — SODIUM CHLORIDE 0.9 % IV SOLN
2.0000 g | INTRAVENOUS | Status: AC
  Administered 2018-11-23: 06:00:00 2 g via INTRAVENOUS
  Filled 2018-11-22: qty 2

## 2018-11-22 MED ORDER — DEXTROSE 10 % IV SOLN: INTRAVENOUS | Status: DC | PRN

## 2018-11-22 MED ORDER — CHLORHEXIDINE GLUCONATE CLOTH 2 % EX PADS
6.0000 | MEDICATED_PAD | Freq: Once | CUTANEOUS | Status: AC
  Administered 2018-11-23: 04:00:00 6 via TOPICAL

## 2018-11-22 NOTE — Progress Notes (Addendum)
[facility] Progress Note  1 Day Post-Op  Subjective: CC-  Patient alert but drowsy on the vent. Remains on 3 pressors. Required 2 units FFP over night   Objective: Vital signs in last 24 hours: Temp:  [97.4 F (36.3 C)-102.1 F (38.9 C)] 101 F (38.3 C) (06/08 0845) Pulse Rate:  [133-151] 136 (06/08 0845) Resp:  [17-35] 22 (06/08 0751) BP: (74-133)/(34-79) 129/63 (06/08 0845) SpO2:  [90 %-100 %] 94 % (06/08 0845) Arterial Line BP: (63-122)/(36-77) 63/58 (06/08 0029) FiO2 (%):  [40 %-100 %] 40 % (06/08 0845)    Intake/Output from previous day: 06/07 0701 - 06/08 0700 In: 6961.8 [I.V.:3936.1; Blood:363; IV Piggyback:2662.7] Out: 6295 [Urine:1310; Emesis/NG output:460; Drains:1850; Blood:300] Intake/Output this shift: Total I/O In: 966.2 [I.V.:484.8; Blood:338; IV Piggyback:143.4] Out: -   PE: Gen:  Alert but drowsy, NAD HEENT: EOM's intact, pupils equal and round Card:  Tachycardic, palpable DP pulses bilaterally Pulm:  Diffuse rhonchi, no wheezing, effort/rate normal on the vent. Port left chest Abd: soft, hypoactive BS, wound vac in place Ext:  Calves soft and nontender  Psych: unable to assess Neuro: will not follow commands for me Skin: warm and dry  Lab Results:  Recent Labs    11/19/2018 2330 11/22/18 0700  WBC 5.0 9.4  HGB 9.8* 13.1  HCT 30.3* 40.5  PLT 95* 69*   BMET Recent Labs    11/22/2018 0355  11/24/2018 1119 11/22/18 0400  NA 136   < > 145 145  K 3.7   < > 3.5 5.0  CL 102  --   --  122*  CO2 23  --   --  19*  GLUCOSE 495*  --   --  145*  BUN 18  --   --  24*  CREATININE 0.73  --   --  1.71*  CALCIUM 9.2  --   --  5.9*   < > = values in this interval not displayed.   PT/INR Recent Labs    12/02/2018 1048 11/30/2018 2336  LABPROT 15.2 17.0*  INR 1.2 1.4*   CMP     Component Value Date/Time   NA 145 11/22/2018 0400   NA 138 06/01/2017 0859   K 5.0 11/22/2018 0400   K 4.2 06/01/2017 0859   CL 122 (H) 11/22/2018 0400    CO2 19 (L) 11/22/2018 0400   CO2 24 06/01/2017 0859   GLUCOSE 145 (H) 11/22/2018 0400   GLUCOSE 118 06/01/2017 0859   BUN 24 (H) 11/22/2018 0400   BUN 15.2 06/01/2017 0859   CREATININE 1.71 (H) 11/22/2018 0400   CREATININE 0.79 06/02/2018 0856   CREATININE 0.7 06/01/2017 0859   CALCIUM 5.9 (LL) 11/22/2018 0400   CALCIUM 9.0 06/01/2017 0859   PROT 7.9 11/20/2018 0355   PROT 7.0 06/01/2017 0859   ALBUMIN 4.2 12/03/2018 0355   ALBUMIN 3.7 06/01/2017 0859   AST 17 12/03/2018 0355   AST 13 (L) 06/02/2018 0856   AST 14 06/01/2017 0859   ALT 18 11/29/2018 0355   ALT 16 06/02/2018 0856   ALT 12 06/01/2017 0859   ALKPHOS 115 12/05/2018 0355   ALKPHOS 97 06/01/2017 0859   BILITOT 0.5 12/09/2018 0355   BILITOT 0.3 06/02/2018 0856   BILITOT 0.22 06/01/2017 0859   GFRNONAA 31 (L) 11/22/2018 0400   GFRNONAA >60 06/02/2018 0856   GFRAA 36 (L) 11/22/2018 0400   GFRAA >60 06/02/2018 0856   Lipase     Component Value Date/Time   LIPASE 24  11/17/2018 0355       Studies/Results: Ct Abdomen Pelvis W Contrast  Result Date: 11/26/2018 CLINICAL DATA:  Patient with abdominal pain, nausea and vomiting. EXAM: CT ABDOMEN AND PELVIS WITH CONTRAST TECHNIQUE: Multidetector CT imaging of the abdomen and pelvis was performed using the standard protocol following bolus administration of intravenous contrast. CONTRAST:  150mL OMNIPAQUE IOHEXOL 300 MG/ML  SOLN COMPARISON:  Acute abdominal series earlier same day FINDINGS: Lower chest: Normal heart size. Dependent atelectasis within the bilateral lower lobes. Hepatobiliary: The liver is normal in size and contour. Subcentimeter too small to characterize low-attenuation lesion right hepatic lobe (image 22; series 2). Gallbladder surgically absent. Pancreas: Unremarkable Spleen: Unremarkable Adrenals/Urinary Tract: Normal adrenal glands. Kidneys enhance symmetrically with contrast. No hydronephrosis. Urinary bladder mildly thick walled. Stomach/Bowel: There is a  focally dilated loop of small bowel within the central abdomen containing small bowel feces. There is at least 1 focal perforation of this small bowel loop with extensive adjacent intraperitoneal fluid and gas (image 70; series 5). The inferior portion of the small bowel loop appears herniated within the left lower anterior abdominal wall (image 60; series 2). The more cranial portion of the small bowel loop appears to be involved within an additional abdominal wall hernia more cranially (image 38; series 2). There is extensive free intraperitoneal gas throughout the abdomen as well with small amount of free intraperitoneal fluid within the lower abdomen. Sigmoid colonic diverticulosis. A portion of the transverse colon is located within an abdominal wall hernia without definite evidence for obstruction (image 29; series 2). Vascular/Lymphatic: Normal caliber abdominal aorta. No retroperitoneal lymphadenopathy. Reproductive: Uterus is surgically absent. Other: Multiple complex small and large bowel containing ventral abdominal wall hernias are demonstrated. Musculoskeletal: Lower lumbar spine and thoracic spine degenerative changes. Right hip joint degenerative changes. IMPRESSION: 1. There is a focally perforated loop of small bowel within the central abdomen with extensive free intraperitoneal gas and fluid. This may be a closed loop obstruction of the small bowel loop as the inferior aspect of this loop is contained within a lower anterior abdominal wall hernia and the more cranial portion of the small bowel loop is contained within a central anterior abdominal wall hernia. 2. Multiple complex ventral abdominal hernias containing both small and large bowel. Critical Value/emergent results were called by telephone at the time of interpretation on 11/22/2018 at 7:49 am to Dr. Sedonia Small, who verbally acknowledged these results. Electronically Signed   By: Lovey Newcomer M.D.   On: 11/26/2018 07:58   Dg Chest Port 1  View  Result Date: 11/22/2018 CLINICAL DATA:  Encounter for intubation. EXAM: PORTABLE CHEST 1 VIEW COMPARISON:  One-view chest x-ray 11/24/2018 FINDINGS: The endotracheal tube is been pulled back, now terminates 3.5 cm above the carina. A left subclavian Port-A-Cath is stable. A right IJ line is stable. Lung volumes are low. New bilateral lower lobe opacities are present. Small effusions are present bilaterally. Surgical clips are again noted in the right axilla. IMPRESSION: 1. Repositioning of endotracheal tube, now 3.5 cm above the carina. 2. Progressive bibasilar airspace opacities and probable small effusions. While this may represent atelectasis, infection is not excluded. 3. Left subclavian Port-A-Cath and right IJ line are stable. Electronically Signed   By: San Morelle M.D.   On: 11/22/2018 07:46   Dg Chest Port 1 View  Result Date: 12/11/2018 CLINICAL DATA:  Postop exploratory laparotomy with central line and endotracheal tube placement check. EXAM: PORTABLE CHEST 1 VIEW COMPARISON:  11/30/2018 FINDINGS: Left subclavian  Port-A-Cath unchanged. Interval placement of right IJ central venous catheter with tip over the SVC. Endotracheal tube is present with tip 5 mm above the carina at the origin of the right mainstem bronchus. This could be pulled back 3 cm. Enteric tube is present with tip over the stomach in the left upper quadrant. Lungs are hypoinflated and otherwise clear. Cardiomediastinal silhouette and remainder of the exam is unchanged. IMPRESSION: Hypoinflation without acute cardiopulmonary disease. Multiple tubes and lines as described. Note that the endotracheal tube has tip 5 mm above the carina at the origin of the right mainstem bronchus and could be pulled back approximately 3 cm. These results were called by telephone at the time of interpretation on 11/20/2018 at 1:47 pm to patient's nurse, Legrand Como, who verbally acknowledged these results. Electronically Signed   By: Marin Olp  M.D.   On: 12/02/2018 13:48   Dg Abdomen Acute W/chest  Result Date: 12/04/2018 CLINICAL DATA:  Abdominal pain, nausea and vomiting. EXAM: DG ABDOMEN ACUTE W/ 1V CHEST COMPARISON:  Chest radiograph 11/16/2013 FINDINGS: Left anterior chest wall Port-A-Cath is present with tip projecting over the superior vena cava. Monitoring leads overlie the patient. Stable cardiac and mediastinal contours. Low lung volumes with bibasilar atelectasis. No large area pulmonary consolidation. Large amount of stool throughout the colon. Multiple gaseous distended loops of bowel throughout the central abdomen. No definite free intraperitoneal air. Lumbar spine degenerative changes. IMPRESSION: Multiple gaseous distended loops of bowel throughout the abdomen raising the possibility of ileus or possible early bowel obstruction. Large amount of stool throughout the colon as can be seen with constipation. Electronically Signed   By: Lovey Newcomer M.D.   On: 11/20/2018 05:42    Anti-infectives: Anti-infectives (From admission, onward)   Start     Dose/Rate Route Frequency Ordered Stop   11/22/18 1800  fluconazole (DIFLUCAN) IVPB 400 mg     400 mg 100 mL/hr over 120 Minutes Intravenous Every 24 hours 12/14/2018 1528     11/22/18 1800  vancomycin (VANCOCIN) 1,500 mg in sodium chloride 0.9 % 500 mL IVPB  Status:  Discontinued     1,500 mg 250 mL/hr over 120 Minutes Intravenous Every 24 hours 11/22/18 0358 11/22/18 0750   11/22/18 0115  vancomycin (VANCOCIN) 1,500 mg in sodium chloride 0.9 % 500 mL IVPB     1,500 mg 250 mL/hr over 120 Minutes Intravenous  Once 11/22/18 0101 11/22/18 0319   12/08/2018 1630  fluconazole (DIFLUCAN) IVPB 800 mg     800 mg 100 mL/hr over 240 Minutes Intravenous  Once 11/19/2018 1527 12/05/2018 2013   12/01/2018 1600  piperacillin-tazobactam (ZOSYN) IVPB 3.375 g     3.375 g 12.5 mL/hr over 240 Minutes Intravenous Every 8 hours 11/20/2018 1522     12/14/2018 0545  piperacillin-tazobactam (ZOSYN) IVPB 3.375 g      3.375 g 100 mL/hr over 30 Minutes Intravenous  Once 11/26/2018 0530 12/06/2018 0626   11/26/2018 0530  piperacillin-tazobactam (ZOSYN) IVPB 4.5 g  Status:  Discontinued     4.5 g 200 mL/hr over 30 Minutes Intravenous  Once 12/12/2018 0517 12/06/2018 0529       Assessment/Plan HTN HLD DM Hypothyroidism 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 Ninfa Linden - POD 1 - Appreciate CCM assistance.  Plan to return to the OR tomorrow if patient is stable enough. Continue full vent support. Continue broad spectrum antibiotics. I spoke with the patient's brother in the room.  ID - zosyn 6/7>>, diflucan 6/7>>, vancomycin 6/8>>6/8 FEN - IVF, NPO/NGT  VTE - SCDs, lovenox on hold for thrombocytopenia Foley - in place Contact - brother Iona Beard [phone] or [phone])    LOS: 1 day    Wellington Hampshire , [facility] 11/22/2018, 9:11 AM Pager: [phone] Mon-Thurs 7:00 am-4:30 pm Fri 7:00 am -11:30 AM Sat-Sun 7:00 am-11:30 am

## 2018-11-22 NOTE — Progress Notes (Signed)
CRITICAL VALUE ALERT  Critical Value:  Calcium 5.9  Date & Time Notied:  11/22/2018 04:40  Provider Notified: Warren Lacy  Orders Received/Actions taken: None at this time.

## 2018-11-22 NOTE — Progress Notes (Signed)
Pharmacy Antibiotic Note  Carmen Stone is a 63 y.o. female admitted on 11/22/2018 with sepsis and fever.  Pharmacy has been consulted for Vancomycin dosing.  Plan: Add 6/8 Vancomycin 1500mg  iv x1, then Vancomycin 1500 mg IV Q 24 hrs. Goal AUC 400-550. Expected AUC: 503 SCr used: 0.8 (adjusted 0.73)   Height: 5\' 4"  (162.6 cm) Weight: 165 lb (74.8 kg) IBW/kg (Calculated) : 54.7  Temp (24hrs), Avg:99.6 F (37.6 C), Min:97.4 F (36.3 C), Max:102.1 F (38.9 C)  Recent Labs  Lab 11/22/2018 0355 12/01/2018 1628 11/24/2018 2330  WBC 22.3* 2.7* 5.0  CREATININE 0.73  --   --     Estimated Creatinine Clearance: 70.3 mL/min (by C-G formula based on SCr of 0.73 mg/dL).    No Known Allergies  Antimicrobials this admission: 6/7 zosyn> 6/7 fluconazole> 6/8 vancomycin >  Dose adjustments this admission:   Microbiology results:   Thank you for allowing pharmacy to be a part of this patient's care.  Nani Skillern Crowford 11/22/2018 3:59 AM

## 2018-11-22 NOTE — Progress Notes (Signed)
[facility] Progress Note Patient Name: FRANSISCA SHAWN DOB: [birth year] MRN: [id number]   Date of Service  11/22/2018  HPI/Events of Note  Coagulopathy - PTT = 38, PT = 17 and INR 1.4  eICU Interventions  Will transfuse 2 units FFP now.      Intervention Category Intermediate Interventions: Coagulopathy - evaluation and management  Sommer,Steven Eugene 11/22/2018, 12:39 AM

## 2018-11-22 NOTE — Progress Notes (Addendum)
NAME:  Carmen Stone, MRN:  [id number], DOB:  [birth year], LOS: 1 ADMISSION DATE:  12/02/2018, CONSULTATION DATE:  11/21/2018 REFERRING MD:  Dr. Ninfa Linden, Surgery, CHIEF COMPLAINT:  Abdominal pain   Brief History   63 yo female presented to ER with nausea, vomiting, abdominal pain.  CT abd/pelvis showed focal perforated loop of SB with free air with multiple complex ventral hernias.  Had emergent laparotomy with SB resection, lysis of adhesions and wound vac.  Remained on vent and pressors post op.  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  Got FFP overnight.    Objective   Blood pressure 123/66, pulse (!) 136, temperature 99.3 F (37.4 C), temperature source Axillary, resp. rate (!) 22, height 5\' 4"  (1.626 m), weight 74.8 kg, SpO2 95 %. CVP:  [9 mmHg-16 mmHg] 9 mmHg  Vent Mode: PRVC FiO2 (%):  [40 %-100 %] 40 % Set Rate:  [18 bmp-22 bmp] 22 bmp Vt Set:  [430 mL] 430 mL PEEP:  [5 cmH20] 5 cmH20 Plateau Pressure:  [10 cmH20-18 cmH20] 17 cmH20   Intake/Output Summary (Last 24 hours) at 11/22/2018 0745 Last data filed at 11/22/2018 0600 Gross per 24 hour  Intake 5961.81 ml  Output 3920 ml  Net 2041.81 ml   Filed Weights   12/09/2018 0309  Weight: 74.8 kg    Examination:  General - sedated Eyes - pupils reactive ENT - ETT in place Cardiac - regular, tachycardic Chest - equal breath sounds b/l, no wheezing or rales Abdomen - wound vac in place Extremities -  cool to touch Skin - no rashes Neuro - RASS -1, moves extremities, follows commands  CXR (reviewed by me) - increased interstitial edema pattern  Resolved Hospital Problem list     Assessment & Plan:   Acute hypoxic respiratory failure in setting of peritonitis. Plan - full vent support - f/u CXR, ABG  Septic shock from peritonitis. Plan - pressors to keep MAP > 65 - continue solu cortef while on pressors - IV fluid at 100 ml/hr >> try to match outpt from urine and wound vac - day 2 of zosyn, diflucan - don't think vancomycin would add anything in this situation >> will d/c - don't think giapreza is an option given bowel issues  S/p SB resection, lysis of adhesions, wound vac. Plan - tentative plan to return to OR on 6/09 if she is stable enough  AKI from ATN. Hyperchloremic metabolic acidosis. Hypocalcemia, hypomagnesemia. Plan - adjust IV fluids - replace electrolytes - monitor urine outpt - f/u BMET, ABG  DM type II. Plan - SSI - hold outpt metformin  Acute metabolic encephalopathy. Plan - RASS goal -1 to -2  Sinus tachycardia. Hx of HTN, HLD. Plan - check Echo, TSH - hold outpt lisinopril, pravachol  Thrombocytopenia from sepsis. Plan - hold lovenox - f/u CBC  Best practice:  Diet: NPO DVT prophylaxis: SCDs GI prophylaxis: Protonix Mobility: Bed rest Code Status: Full  code Disposition: ICU  Labs    CMP Latest Ref Rng & Units 11/22/2018 12/02/2018 12/11/2018  Glucose 70 - 99 mg/dL 145(H) - -  BUN 8 - 23 mg/dL 24(H) - -  Creatinine 0.44 - 1.00 mg/dL 1.71(H) - -  Sodium 135 - 145 mmol/L 145 145 146(H)  Potassium 3.5 - 5.1 mmol/L 5.0 3.5 3.7  Chloride 98 - 111 mmol/L 122(H) - -  CO2 22 - 32 mmol/L 19(L) - -  Calcium 8.9 - 10.3 mg/dL 5.9(LL) - -  Total Protein 6.5 - 8.1 g/dL - - -  Total Bilirubin 0.3 - 1.2 mg/dL - - -  Alkaline Phos 38 - 126 U/L - - -  AST 15 - 41 U/L - - -  ALT 0 - 44 U/L - - -   CBC Latest Ref Rng & Units 11/22/2018  11/30/2018 11/29/2018  WBC 4.0 - 10.5 K/uL 9.4 5.0 2.7(L)  Hemoglobin 12.0 - 15.0 g/dL 13.1 9.8(L) 10.7(L)  Hematocrit 36.0 - 46.0 % 40.5 30.3(L) 33.4(L)  Platelets 150 - 400 K/uL 69(L) 95(L) 163   ABG    Component Value Date/Time   PHART 7.287 (L) 11/22/2018 0418   PCO2ART 35.2 11/22/2018 0418   PO2ART 79.2 (L) 11/22/2018 0418   HCO3 16.2 (L) 11/22/2018 0418   TCO2 16 (L) 12/07/2018 1119   ACIDBASEDEF 9.1 (H) 11/22/2018 0418   O2SAT 95.0 11/22/2018 0418   CBG (last 3)  Recent Labs    11/22/18 0432 11/22/18 0536 11/22/18 0640  GLUCAP 114* 128* 123*   CC time 33 minutes  D/w Dr. Delorse Limber, MD [facility]/Critical Care 11/22/2018, 7:53 AM

## 2018-11-22 NOTE — Progress Notes (Addendum)
Initial Nutrition Assessment  DOCUMENTATION CODES:   Not applicable  INTERVENTION:  - will monitor for need for TPN/plan concerning nutrition.   NUTRITION DIAGNOSIS:   Inadequate oral intake related to inability to eat as evidenced by NPO status.  GOAL:   Patient will meet greater than or equal to 90% of their needs  MONITOR:   TF tolerance, Labs, Weight trends, Skin, I & O's  REASON FOR ASSESSMENT:   Ventilator  ASSESSMENT:   63 year old female with past medical history of type 2 DM, hyperlipidemia, HTN, inflammatory R breast cancer s/p radiation in 2015. Patient presented to the ED on 6/7 with severe abdominal pain, N/V. CT abdomen/pelvis which showed perforated small bowel with large amount of free air and multiple incarcerated incisional hernias.   Patient intubated with NGT in place and to LIS with 400 ml very dark output in canister. RN confirms that this output was from night shift; does not appear to have any output yet this shift. Per chart review, current weight is 165 lb and weight on 06/02/18 was 168 lb. Noted that patient has double lumen CVC in R IJ and implanted port which was inserted 5 years ago.   Per notes: patient is POD #1ex lap, small bowel resection, LOAs, and placement of wound vac with plan to return to OR 6/9 if she is stable enough; acute hypoxic respiratory failure in setting of peritonitis; septic shock 2/2 peritonitis; AKI from ATN; hyperchloremic metabolic acidosis, hypocalcemia and hypomagnesemia, acute metabolic encephalopathy.   Patient is currently intubated on ventilator support MV: 9.6 L/min Temp (24hrs), Avg:99.8 F (37.7 C), Min:97.4 F (36.3 C), Max:102.1 F (38.9 C) Propofol: none BP: 135/69 and MAP: 92  Medications reviewed; 2 g IV Ca gluconate x1 run 6/8, 50 mg solu-cortef QID, sliding scale novolog, 2 g IV Mg sulfate x1 run 6/8, 2 amps sodium bicarb 6/7. Labs reviewed; CBGs: 114-167 mg/dl since midnight, Cl: 122 mmol/l, BUN: 24  mg/dl, creatinine: 1.71 mg/dl, Ca: 5.9 mg/dl, Mg: 1.1 mg/dl, GFR: 36 ml/min.  IVF; D5-1/2 NS @ 100 ml/hr (408 kcal).  Drips; vaso @ 0.03 units/min, levo @ 67 mcg/min, fentanyl @ 100 mcg/hr, neo @ 340 mcg/min.     NUTRITION - FOCUSED PHYSICAL EXAM:  completed; no muscle or fat wasting, mild edema noted to all extremities.   Diet Order:   Diet Order            Diet NPO time specified  Diet effective now              EDUCATION NEEDS:   No education needs have been identified at this time  Skin:  Skin Assessment: Skin Integrity Issues: Skin Integrity Issues:: Incisions, Wound VAC Wound Vac: abdomen Incisions: abdomen (6/7)  Last BM:  PTA/unknown  Height:   Ht Readings from Last 1 Encounters:  11/20/2018 5\' 4"  (1.626 m)    Weight:   Wt Readings from Last 1 Encounters:  12/09/2018 74.8 kg    Ideal Body Weight:  54.5 kg  BMI:  Body mass index is 28.32 kg/m.  Estimated Nutritional Needs:   Kcal:  1818 kcal  Protein:  105-120 grams  Fluid:  >/= 2 L/day     Jarome Matin, MS, RD, LDN, [facility] Inpatient Clinical Dietitian Pager # [phone] After hours/weekend pager # [phone]

## 2018-11-22 NOTE — Progress Notes (Signed)
[facility] Progress Note Patient Name: Carmen Stone DOB: 30-Jan-1956 MRN: [id number]   Date of Service  11/22/2018  HPI/Events of Note  ABG on 40%/PRVC 26/TV 430/P 5 = 7.188/30.8/96.0/11.2  eICU Interventions  Will order: 1. Increase PRVC rate to 32. 2. NaHCO3 100 meq IV now.  3. Increase NaHCO3 IV infusion to 125 mL/hour.  4.. Repeat ABG at 1 AM.     Intervention Category Major Interventions: Acid-Base disturbance - evaluation and management;Respiratory failure - evaluation and management  Emilyrose Darrah Eugene 11/22/2018, 10:03 PM

## 2018-11-22 NOTE — Progress Notes (Signed)
RT X's 2 attempted Aline reinsertion unsuccessfully, MD aware.  RT to monitor and assess as needed.

## 2018-11-22 NOTE — Progress Notes (Signed)
2D Echocardiogram has been performed.  Carmen Stone 11/22/2018, 10:45 AM

## 2018-11-22 NOTE — Progress Notes (Signed)
CRITICAL VALUE ALERT  Critical Value:  Calcium 5.9  Date & Time Notied:  11/22/2018 6:26 PM  Provider Notified: Dr. Halford Chessman  Orders Received/Actions taken: New orders received. See MAR.

## 2018-11-22 NOTE — Progress Notes (Signed)
Verbal order for 2U blood given. Surgical MD on call aware of pt temp prior to administration of blood.  Will continue to monitor

## 2018-11-22 NOTE — Addendum Note (Signed)
Addendum  created 11/22/18 0640 by Mitzie Na, CRNA   Charge Capture section accepted

## 2018-11-22 NOTE — Progress Notes (Signed)
Attempted to obtain ABG via current a-line and was not able to pull blood from line.  Trouble shooting techniques were unsuccessful.  Dr, Emmit Alexanders made aware and orders received to attempt reinsertion of new line.

## 2018-11-22 NOTE — Progress Notes (Signed)
[place] Progress Note Patient Name: Carmen Stone DOB: 09/07/1955 MRN: [id number]   Date of Service  11/22/2018  HPI/Events of Note  Fever to 102.1 F - Increased vasopressor requirements. Now on Norepinephrine, Phenylephrine and Vasopressin. AST and ALT both normal.   eICU Interventions  Will order: 1. Tylenol Suppository 650 mg PR Q 6 hours PRN Temp > 101.0 F. 2. Vancomycin per pharmacy consult.      Intervention Category Major Interventions: Infection - evaluation and management  Lynora Dymond Eugene 11/22/2018, 12:59 AM

## 2018-11-22 NOTE — Progress Notes (Signed)
[facility] Progress Note Patient Name: Carmen Stone DOB: 19-Nov-1955 MRN: [id number]   Date of Service  11/22/2018  HPI/Events of Note  Ca++ = 5.9 and Mg++ = 1.1.   eICU Interventions  Will replace Mg++ and Ca++.      Intervention Category Major Interventions: Electrolyte abnormality - evaluation and management  Sommer,Steven Eugene 11/22/2018, 5:19 AM

## 2018-11-23 ENCOUNTER — Inpatient Hospital Stay (HOSPITAL_COMMUNITY): Payer: BLUE CROSS/BLUE SHIELD

## 2018-11-23 ENCOUNTER — Encounter (HOSPITAL_COMMUNITY): Admission: EM | Disposition: E | Payer: Self-pay | Source: Home / Self Care

## 2018-11-23 ENCOUNTER — Inpatient Hospital Stay (HOSPITAL_COMMUNITY): Payer: BLUE CROSS/BLUE SHIELD | Admitting: Anesthesiology

## 2018-11-23 DIAGNOSIS — A419 Sepsis, unspecified organism: Secondary | ICD-10-CM

## 2018-11-23 DIAGNOSIS — Z95828 Presence of other vascular implants and grafts: Secondary | ICD-10-CM

## 2018-11-23 DIAGNOSIS — K631 Perforation of intestine (nontraumatic): Secondary | ICD-10-CM

## 2018-11-23 DIAGNOSIS — Z452 Encounter for adjustment and management of vascular access device: Secondary | ICD-10-CM

## 2018-11-23 DIAGNOSIS — K55029 Acute infarction of small intestine, extent unspecified: Secondary | ICD-10-CM

## 2018-11-23 DIAGNOSIS — I5021 Acute systolic (congestive) heart failure: Secondary | ICD-10-CM

## 2018-11-23 DIAGNOSIS — N179 Acute kidney failure, unspecified: Secondary | ICD-10-CM

## 2018-11-23 DIAGNOSIS — Z992 Dependence on renal dialysis: Secondary | ICD-10-CM

## 2018-11-23 HISTORY — PX: LAPAROTOMY: SHX154

## 2018-11-23 LAB — BLOOD GAS, ARTERIAL
Acid-base deficit: 10.6 mmol/L — ABNORMAL HIGH (ref 0.0–2.0)
Acid-base deficit: 12.2 mmol/L — ABNORMAL HIGH (ref 0.0–2.0)
Acid-base deficit: 12.7 mmol/L — ABNORMAL HIGH (ref 0.0–2.0)
Bicarbonate: 12.3 mmol/L — ABNORMAL LOW (ref 20.0–28.0)
Bicarbonate: 12.5 mmol/L — ABNORMAL LOW (ref 20.0–28.0)
Bicarbonate: 13.3 mmol/L — ABNORMAL LOW (ref 20.0–28.0)
Drawn by: 225631
Drawn by: 225631
Drawn by: 308601
FIO2: 30
FIO2: 40
FIO2: 40
MECHVT: 0.43 mL
MECHVT: 0.43 mL
MECHVT: 430 mL
O2 Saturation: 96.7 %
O2 Saturation: 97.6 %
O2 Saturation: 97.8 %
PEEP: 5 cmH2O
PEEP: 5 cmH2O
PEEP: 5 cmH2O
Patient temperature: 100.8
Patient temperature: 102
Patient temperature: 97.9
RATE: 32 resp/min
RATE: 32 resp/min
RATE: 32 resp/min
pCO2 arterial: 24.7 mmHg — ABNORMAL LOW (ref 32.0–48.0)
pCO2 arterial: 27.4 mmHg — ABNORMAL LOW (ref 32.0–48.0)
pCO2 arterial: 28.8 mmHg — ABNORMAL LOW (ref 32.0–48.0)
pH, Arterial: 7.264 — ABNORMAL LOW (ref 7.350–7.450)
pH, Arterial: 7.287 — ABNORMAL LOW (ref 7.350–7.450)
pH, Arterial: 7.35 (ref 7.350–7.450)
pO2, Arterial: 118 mmHg — ABNORMAL HIGH (ref 83.0–108.0)
pO2, Arterial: 118 mmHg — ABNORMAL HIGH (ref 83.0–108.0)
pO2, Arterial: 119 mmHg — ABNORMAL HIGH (ref 83.0–108.0)

## 2018-11-23 LAB — COOXEMETRY PANEL
Carboxyhemoglobin: 0.7 % (ref 0.5–1.5)
Methemoglobin: 0.8 % (ref 0.0–1.5)
O2 Saturation: 70.1 %
Total hemoglobin: 11.7 g/dL — ABNORMAL LOW (ref 12.0–16.0)

## 2018-11-23 LAB — BASIC METABOLIC PANEL
Anion gap: 16 — ABNORMAL HIGH (ref 5–15)
Anion gap: 18 — ABNORMAL HIGH (ref 5–15)
BUN: 35 mg/dL — ABNORMAL HIGH (ref 8–23)
BUN: 38 mg/dL — ABNORMAL HIGH (ref 8–23)
CO2: 13 mmol/L — ABNORMAL LOW (ref 22–32)
CO2: 15 mmol/L — ABNORMAL LOW (ref 22–32)
Calcium: 5.6 mg/dL — CL (ref 8.9–10.3)
Calcium: 6.2 mg/dL — CL (ref 8.9–10.3)
Chloride: 115 mmol/L — ABNORMAL HIGH (ref 98–111)
Chloride: 111 mmol/L (ref 98–111)
Creatinine, Ser: 3.16 mg/dL — ABNORMAL HIGH (ref 0.44–1.00)
Creatinine, Ser: 3.29 mg/dL — ABNORMAL HIGH (ref 0.44–1.00)
GFR calc Af Amer: 17 mL/min — ABNORMAL LOW (ref >60)
GFR calc Af Amer: 16 mL/min — ABNORMAL LOW (ref >60)
GFR calc non Af Amer: 15 mL/min — ABNORMAL LOW (ref >60)
GFR calc non Af Amer: 14 mL/min — ABNORMAL LOW (ref >60)
Glucose, Bld: 115 mg/dL — ABNORMAL HIGH (ref 70–99)
Glucose, Bld: 105 mg/dL — ABNORMAL HIGH (ref 70–99)
Potassium: 4.4 mmol/L (ref 3.5–5.1)
Potassium: 4.3 mmol/L (ref 3.5–5.1)
Sodium: 144 mmol/L (ref 135–145)
Sodium: 144 mmol/L (ref 135–145)

## 2018-11-23 LAB — PREPARE FRESH FROZEN PLASMA
Unit division: 0
Unit division: 0

## 2018-11-23 LAB — GLUCOSE, CAPILLARY
Glucose-Capillary: 68 mg/dL — ABNORMAL LOW (ref 70–99)
Glucose-Capillary: 74 mg/dL (ref 70–99)
Glucose-Capillary: 65 mg/dL — ABNORMAL LOW (ref 70–99)
Glucose-Capillary: 117 mg/dL — ABNORMAL HIGH (ref 70–99)
Glucose-Capillary: 126 mg/dL — ABNORMAL HIGH (ref 70–99)
Glucose-Capillary: 65 mg/dL — ABNORMAL LOW (ref 70–99)
Glucose-Capillary: 68 mg/dL — ABNORMAL LOW (ref 70–99)
Glucose-Capillary: 147 mg/dL — ABNORMAL HIGH (ref 70–99)
Glucose-Capillary: 100 mg/dL — ABNORMAL HIGH (ref 70–99)
Glucose-Capillary: 77 mg/dL (ref 70–99)
Glucose-Capillary: 71 mg/dL (ref 70–99)

## 2018-11-23 LAB — CBC
HCT: 40 % (ref 36.0–46.0)
HCT: 38 % (ref 36.0–46.0)
Hemoglobin: 12.6 g/dL (ref 12.0–15.0)
Hemoglobin: 12.3 g/dL (ref 12.0–15.0)
MCH: 29.6 pg (ref 26.0–34.0)
MCH: 30.3 pg (ref 26.0–34.0)
MCHC: 31.5 g/dL (ref 30.0–36.0)
MCHC: 32.4 g/dL (ref 30.0–36.0)
MCV: 93.9 fL (ref 80.0–100.0)
MCV: 93.6 fL (ref 80.0–100.0)
Platelets: 33 10*3/uL — ABNORMAL LOW (ref 150–400)
Platelets: 58 10*3/uL — ABNORMAL LOW (ref 150–400)
RBC: 4.26 MIL/uL (ref 3.87–5.11)
RBC: 4.06 MIL/uL (ref 3.87–5.11)
RDW: 15.2 % (ref 11.5–15.5)
RDW: 15.3 % (ref 11.5–15.5)
WBC: 13.1 10*3/uL — ABNORMAL HIGH (ref 4.0–10.5)
WBC: 10.1 10*3/uL (ref 4.0–10.5)
nRBC: 0.5 % — ABNORMAL HIGH (ref 0.0–0.2)
nRBC: 4.3 % — ABNORMAL HIGH (ref 0.0–0.2)

## 2018-11-23 LAB — RENAL FUNCTION PANEL
Albumin: 1.8 g/dL — ABNORMAL LOW (ref 3.5–5.0)
Anion gap: 20 — ABNORMAL HIGH (ref 5–15)
BUN: 41 mg/dL — ABNORMAL HIGH (ref 8–23)
CO2: 12 mmol/L — ABNORMAL LOW (ref 22–32)
Calcium: 5.8 mg/dL — CL (ref 8.9–10.3)
Chloride: 113 mmol/L — ABNORMAL HIGH (ref 98–111)
Creatinine, Ser: 3.79 mg/dL — ABNORMAL HIGH (ref 0.44–1.00)
GFR calc Af Amer: 14 mL/min — ABNORMAL LOW (ref >60)
GFR calc non Af Amer: 12 mL/min — ABNORMAL LOW (ref >60)
Glucose, Bld: 89 mg/dL (ref 70–99)
Phosphorus: 6.9 mg/dL — ABNORMAL HIGH (ref 2.5–4.6)
Potassium: 4.5 mmol/L (ref 3.5–5.1)
Sodium: 145 mmol/L (ref 135–145)

## 2018-11-23 LAB — BPAM FFP
Blood Product Expiration Date: 202006132359
Blood Product Expiration Date: 202006132359
ISSUE DATE / TIME: 202006080533
ISSUE DATE / TIME: 202006080858
Unit Type and Rh: 5100
Unit Type and Rh: 5100

## 2018-11-23 LAB — POCT I-STAT 7, (LYTES, BLD GAS, ICA,H+H)
Acid-base deficit: 15 mmol/L — ABNORMAL HIGH (ref 0.0–2.0)
Bicarbonate: 14.1 mmol/L — ABNORMAL LOW (ref 20.0–28.0)
Calcium, Ion: 1.03 mmol/L — ABNORMAL LOW (ref 1.15–1.40)
HCT: 32 % — ABNORMAL LOW (ref 36.0–46.0)
Hemoglobin: 10.9 g/dL — ABNORMAL LOW (ref 12.0–15.0)
O2 Saturation: 100 %
Patient temperature: 35.3
Potassium: 4.2 mmol/L (ref 3.5–5.1)
Sodium: 145 mmol/L (ref 135–145)
TCO2: 16 mmol/L — ABNORMAL LOW (ref 22–32)
pCO2 arterial: 44.3 mmHg (ref 32.0–48.0)
pH, Arterial: 7.102 — CL (ref 7.350–7.450)
pO2, Arterial: 311 mmHg — ABNORMAL HIGH (ref 83.0–108.0)

## 2018-11-23 LAB — MAGNESIUM: Magnesium: 1.5 mg/dL — ABNORMAL LOW (ref 1.7–2.4)

## 2018-11-23 LAB — LACTIC ACID, PLASMA: Lactic Acid, Venous: 9.5 mmol/L (ref 0.5–1.9)

## 2018-11-23 LAB — TRIGLYCERIDES: Triglycerides: 85 mg/dL (ref <150)

## 2018-11-23 SURGERY — LAPAROTOMY, EXPLORATORY
Anesthesia: General

## 2018-11-23 MED ORDER — CALCIUM GLUCONATE-NACL 1-0.675 GM/50ML-% IV SOLN
1.0000 g | Freq: Once | INTRAVENOUS | Status: AC
  Administered 2018-11-23: 1000 mg via INTRAVENOUS
  Filled 2018-11-23: qty 50

## 2018-11-23 MED ORDER — DEXTROSE 50 % IV SOLN
INTRAVENOUS | Status: AC
  Administered 2018-11-23: 08:00:00 25 mL
  Filled 2018-11-23: qty 50

## 2018-11-23 MED ORDER — FENTANYL CITRATE (PF) 100 MCG/2ML IJ SOLN
INTRAMUSCULAR | Status: AC
  Filled 2018-11-23: qty 2

## 2018-11-23 MED ORDER — PHENYLEPHRINE HCL (PRESSORS) 10 MG/ML IV SOLN
INTRAVENOUS | Status: AC
  Filled 2018-11-23: qty 1

## 2018-11-23 MED ORDER — FLUCONAZOLE IN SODIUM CHLORIDE 200-0.9 MG/100ML-% IV SOLN: 200.0000 mg | INTRAVENOUS | Status: DC

## 2018-11-23 MED ORDER — ROCURONIUM BROMIDE 10 MG/ML (PF) SYRINGE
PREFILLED_SYRINGE | INTRAVENOUS | Status: DC | PRN
  Administered 2018-11-23 (×2): 50 mg via INTRAVENOUS

## 2018-11-23 MED ORDER — FENTANYL CITRATE (PF) 100 MCG/2ML IJ SOLN
INTRAMUSCULAR | Status: DC | PRN
  Administered 2018-11-23 (×2): 50 ug via INTRAVENOUS

## 2018-11-23 MED ORDER — VASOPRESSIN 20 UNIT/ML IV SOLN
INTRAVENOUS | Status: AC
  Filled 2018-11-23: qty 1

## 2018-11-23 MED ORDER — 0.9 % SODIUM CHLORIDE (POUR BTL) OPTIME
TOPICAL | Status: DC | PRN
  Administered 2018-11-23 (×3): 2000 mL

## 2018-11-23 MED ORDER — DEXTROSE 50 % IV SOLN
INTRAVENOUS | Status: AC
  Administered 2018-11-23: 25 mL
  Filled 2018-11-23: qty 50

## 2018-11-23 MED ORDER — ALTEPLASE 2 MG IJ SOLR: 2.0000 mg | Freq: Once | INTRAMUSCULAR | Status: DC | PRN

## 2018-11-23 MED ORDER — ONDANSETRON HCL 4 MG/2ML IJ SOLN
INTRAMUSCULAR | Status: DC | PRN
  Administered 2018-11-23: 4 mg via INTRAVENOUS

## 2018-11-23 MED ORDER — PIPERACILLIN-TAZOBACTAM IN DEX 2-0.25 GM/50ML IV SOLN
2.2500 g | Freq: Four times a day (QID) | INTRAVENOUS | Status: DC
  Administered 2018-11-23 – 2018-12-01 (×30): 2.25 g via INTRAVENOUS
  Filled 2018-11-23 (×34): qty 50

## 2018-11-23 MED ORDER — PRISMASOL BGK 4/2.5 32-4-2.5 MEQ/L IV SOLN
INTRAVENOUS | Status: DC
  Administered 2018-11-24 – 2018-11-30 (×27): via INTRAVENOUS_CENTRAL
  Filled 2018-11-23 (×81): qty 5000

## 2018-11-23 MED ORDER — PRISMASOL BGK 4/2.5 32-4-2.5 MEQ/L REPLACEMENT SOLN
Status: DC
  Administered 2018-11-25 – 2018-12-05 (×14): via INTRAVENOUS_CENTRAL
  Filled 2018-11-23 (×28): qty 5000

## 2018-11-23 MED ORDER — MIDAZOLAM HCL 5 MG/5ML IJ SOLN
INTRAMUSCULAR | Status: DC | PRN
  Administered 2018-11-23: 2 mg via INTRAVENOUS

## 2018-11-23 MED ORDER — PRISMASOL BGK 4/2.5 32-4-2.5 MEQ/L REPLACEMENT SOLN
Status: DC
  Administered 2018-11-25 – 2018-12-05 (×12): via INTRAVENOUS_CENTRAL
  Filled 2018-11-23 (×14): qty 5000

## 2018-11-23 MED ORDER — PHENYLEPHRINE 40 MCG/ML (10ML) SYRINGE FOR IV PUSH (FOR BLOOD PRESSURE SUPPORT)
PREFILLED_SYRINGE | INTRAVENOUS | Status: DC | PRN
  Administered 2018-11-23: 240 ug via INTRAVENOUS

## 2018-11-23 MED ORDER — MIDAZOLAM HCL 2 MG/2ML IJ SOLN
INTRAMUSCULAR | Status: AC
  Filled 2018-11-23: qty 2

## 2018-11-23 MED ORDER — MAGNESIUM SULFATE 2 GM/50ML IV SOLN
2.0000 g | Freq: Once | INTRAVENOUS | Status: AC
  Administered 2018-11-23: 06:00:00 2 g via INTRAVENOUS
  Filled 2018-11-23: qty 50

## 2018-11-23 MED ORDER — DEXTROSE 50 % IV SOLN
INTRAVENOUS | Status: AC
  Filled 2018-11-23: qty 50

## 2018-11-23 MED ORDER — SODIUM CHLORIDE 0.9 % IV SOLN
INTRAVENOUS | Status: DC | PRN
  Administered 2018-11-23: 17:00:00 via INTRAVENOUS

## 2018-11-23 MED ORDER — PROPOFOL 10 MG/ML IV BOLUS
INTRAVENOUS | Status: AC
  Filled 2018-11-23: qty 20

## 2018-11-23 MED ORDER — SODIUM BICARBONATE 8.4 % IV SOLN
100.0000 meq | Freq: Once | INTRAVENOUS | Status: AC
  Administered 2018-11-23: 21:00:00 100 meq via INTRAVENOUS
  Filled 2018-11-23: qty 100

## 2018-11-23 MED ORDER — CALCIUM CHLORIDE 10 % IV SOLN
INTRAVENOUS | Status: DC | PRN
  Administered 2018-11-23: 1 g via INTRAVENOUS

## 2018-11-23 MED ORDER — HEPARIN SODIUM (PORCINE) 1000 UNIT/ML DIALYSIS
1000.0000 [IU] | INTRAMUSCULAR | Status: DC | PRN
  Administered 2018-11-23 – 2018-12-04 (×4): 2400 [IU] via INTRAVENOUS_CENTRAL
  Administered 2018-12-04: 3000 [IU] via INTRAVENOUS_CENTRAL
  Administered 2018-12-05: 2400 [IU] via INTRAVENOUS_CENTRAL
  Filled 2018-11-23 (×2): qty 6
  Filled 2018-11-23: qty 3
  Filled 2018-11-23 (×2): qty 6
  Filled 2018-11-23 (×2): qty 3
  Filled 2018-11-23 (×4): qty 6

## 2018-11-23 MED ORDER — SODIUM CHLORIDE 0.9% IV SOLUTION
Freq: Once | INTRAVENOUS | Status: AC
  Administered 2018-11-23: 16:00:00 via INTRAVENOUS

## 2018-11-23 MED ORDER — FLUCONAZOLE IN SODIUM CHLORIDE 400-0.9 MG/200ML-% IV SOLN
400.0000 mg | INTRAVENOUS | Status: DC
  Administered 2018-11-23 – 2018-11-24 (×2): 400 mg via INTRAVENOUS
  Filled 2018-11-23 (×2): qty 200

## 2018-11-23 MED ORDER — SODIUM CHLORIDE 0.9 % FOR CRRT
INTRAVENOUS_CENTRAL | Status: DC | PRN
  Administered 2018-11-29 – 2018-12-05 (×3): via INTRAVENOUS_CENTRAL
  Filled 2018-11-23 (×4): qty 1000

## 2018-11-23 MED ORDER — DEXTROSE 50 % IV SOLN
INTRAVENOUS | Status: AC
  Administered 2018-11-23: 15:00:00 25 mL
  Filled 2018-11-23: qty 50

## 2018-11-23 MED ORDER — DEXTROSE 50 % IV SOLN
12.5000 g | INTRAVENOUS | Status: AC
  Administered 2018-11-23: 04:00:00 12.5 g via INTRAVENOUS

## 2018-11-23 SURGICAL SUPPLY — 21 items
BLADE CLIPPER SURG (BLADE) IMPLANT
BLADE EXTENDED COATED 6.5IN (ELECTRODE) IMPLANT
CANISTER WOUNDNEG PRESSURE 500 (CANNISTER) ×2 IMPLANT
COVER WAND RF STERILE (DRAPES) IMPLANT
DRAPE LAPAROSCOPIC ABDOMINAL (DRAPES) ×3 IMPLANT
DRAPE WARM FLUID 44X44 (DRAPES) ×3 IMPLANT
ELECT REM PT RETURN 15FT ADLT (MISCELLANEOUS) ×3 IMPLANT
GAUZE SPONGE 4X4 12PLY STRL (GAUZE/BANDAGES/DRESSINGS) ×3 IMPLANT
GLOVE BIO SURGEON STRL SZ7.5 (GLOVE) ×3 IMPLANT
GOWN STRL REUS W/TWL LRG LVL3 (GOWN DISPOSABLE) ×9 IMPLANT
HANDLE SUCTION POOLE (INSTRUMENTS) ×1 IMPLANT
KIT BASIN OR (CUSTOM PROCEDURE TRAY) ×3 IMPLANT
KIT TURNOVER KIT A (KITS) IMPLANT
NS IRRIG 1000ML POUR BTL (IV SOLUTION) ×14 IMPLANT
PACK GENERAL/GYN (CUSTOM PROCEDURE TRAY) ×3 IMPLANT
PROTECTOR NERVE ULNAR (MISCELLANEOUS) ×6 IMPLANT
SPONGE ABDOMINAL VAC ABTHERA (MISCELLANEOUS) ×2 IMPLANT
SPONGE LAP 18X18 RF (DISPOSABLE) IMPLANT
SUCTION POOLE HANDLE (INSTRUMENTS) ×3
TOWEL OR 17X26 10 PK STRL BLUE (TOWEL DISPOSABLE) ×6 IMPLANT
YANKAUER SUCT BULB TIP NO VENT (SUCTIONS) IMPLANT

## 2018-11-23 NOTE — Procedures (Signed)
Central Venous Catheter Insertion Procedure Note LENYA STERNE [id number] 1956-03-12  Procedure: Insertion of Central Venous Catheter Indications: Drug and/or fluid administration and Frequent blood sampling  Procedure Details Consent: Risks of procedure as well as the alternatives and risks of each were explained to the (patient/caregiver).  Consent for procedure obtained. Time Out: Verified patient identification, verified procedure, site/side was marked, verified correct patient position, special equipment/implants available, medications/allergies/relevent history reviewed, required imaging and test results available.  Performed  Maximum sterile technique was used including antiseptics, cap, gloves, gown, hand hygiene, mask and sheet. Skin prep: Chlorhexidine; local anesthetic administered A antimicrobial bonded/coated    catheter was placed in the right subclavian vein using the Seldinger technique.  Evaluation Blood flow good Complications: No apparent complications Patient did tolerate procedure well. Chest X-ray ordered to verify placement.  CXR: pending.  Rulon Sera ACNP-BC [place] Pager # [phone] OR # [phone] if no answer  12/02/2018, 1:15 PM

## 2018-11-23 NOTE — Progress Notes (Signed)
[facility] Progress Note Patient Name: Carmen Stone DOB: [birth year] MRN: [id number]   Date of Service  11/15/2018  HPI/Events of Note  Magnesium = 1.5 and Ca++ = 5.6.  eICU Interventions  Will replace Mg++ and Ca++.     Intervention Category Major Interventions: Electrolyte abnormality - evaluation and management  Sommer,Steven Eugene 12/09/2018, 5:40 AM

## 2018-11-23 NOTE — Progress Notes (Signed)
CCM transfer Note: I spoke to Dr. Halford Chessman personally to give him report on the medical care in the operating room.

## 2018-11-23 NOTE — Transfer of Care (Signed)
Immediate Anesthesia Transfer of Care Note  Patient: Carmen Stone  Procedure(s) Performed: EXPLORATORY LAPAROTOMY WITH Vesper (N/A )  Patient Location: ICU  Anesthesia Type:General  Level of Consciousness: sedated, unresponsive and Patient remains intubated per anesthesia plan  Airway & Oxygen Therapy: Patient remains intubated per anesthesia plan and Patient placed on Ventilator (see vital sign flow sheet for setting)  Post-op Assessment: Report given to RN and Post -op Vital signs reviewed and stable  Post vital signs: Reviewed and stable  Last Vitals:  Vitals Value Taken Time  BP    Temp    Pulse 133 11/16/2018  5:38 PM  Resp    SpO2 96 % 12/05/2018  5:38 PM  Vitals shown include unvalidated device data.  Last Pain:  Vitals:   12/08/2018 1525  TempSrc: Axillary  PainSc:          Complications: No apparent anesthesia complications

## 2018-11-23 NOTE — Progress Notes (Signed)
NAME:  Carmen Stone, MRN:  [id number], DOB:  26-May-1956, LOS: 2 ADMISSION DATE:  11/22/2018, CONSULTATION DATE:  11/21/2018 REFERRING MD:  Dr. Ninfa Linden, Surgery, CHIEF COMPLAINT:  Abdominal pain   Brief History   63 yo female presented to ER with nausea, vomiting, abdominal pain.  CT abd/pelvis showed focal perforated loop of SB with free air with multiple complex ventral hernias.  Had emergent laparotomy with SB resection, lysis of adhesions and wound vac.  Remained on vent and pressors post op.  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, HTN, HLD, DM type II  Significant Hospital Events   6/07 Admit, to OR 6/08 transfuse FFP, on multiple pressors  Consults:  Renal 6/09 AKI, acidosis Cardiology 6/09 Takotsubo CM  Procedures:  ETT 6/07 >> Rt IJ CVL 6/07 >> Lt radial A line 6/07 >>   Significant Diagnostic Tests:  CT abd/pelvis 6/07 >> focal perforated loop of SB with free air with multiple complex ventral hernias Echo 6/08 >> EF 20%, Takotsubo CM  Micro Data:  COVID 6/07 >> negative  Antimicrobials:  Zosyn 6/07 >> Diflucan 6/07 >>  Interim history/subjective:  Remains on pressors, minimal urine outpt.    Objective   Blood pressure (!) 143/65, pulse (!) 109, temperature (!) 101 F (38.3 C), temperature source Oral, resp. rate (!) 24, height 5\' 4"  (1.626 m), weight 74.8 kg, SpO2 96 %. CVP:  [10 mmHg-16 mmHg] 13 mmHg  Vent Mode: PRVC FiO2 (%):  [30 %-40 %] 30 % Set Rate:  [22 bmp-32 bmp] 32 bmp Vt Set:  [430 mL] 430 mL PEEP:  [5 cmH20] 5 cmH20 Plateau Pressure:  [21 cmH20-26 cmH20] 25 cmH20   Intake/Output Summary (Last 24 hours) at 12/11/2018 0810 Last data filed at 11/29/2018 0630 Gross per 24 hour  Intake 6511.67 ml  Output 1540 ml  Net 4971.67 ml   Filed Weights   12/05/2018 0309  Weight: 74.8 kg    Examination:  General - sedated Eyes - pupils reactive ENT - ETT in place Cardiac - regular, tachycardic, no murmur Chest  - b/l rales, s/p Rt mastectomy Abdomen - wound vac in place Extremities - cool to touch Skin - no rashes Neuro - RASS -3  CXR (reviewed by me) - CHF pattern   Resolved Hospital Problem list     Assessment & Plan:   Acute hypoxic respiratory failure in setting of peritonitis. Plan - full vent support - f/u CXR, ABG  Septic shock from peritonitis. 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 Hx of HTN, HLD. Plan - consult cardiology  - hold outpt lisinopril, pravachol  AKI from ATN >> Baseline creatinine 0.73 from 11/21/18. Metabolic acidosis. Hypocalcemia, hypomagnesemia. Plan - renal consulted, likely will need CRRT - f/u BMET, ABG - continue HCO3 in IV fluids for now  S/p SB resection, lysis of adhesions, wound vac. Plan - plan is to return to OR 6/09 >> not sure she is stable enough for this at present  DM type II. Plan - SSI - hold outpt metformin  Acute metabolic encephalopathy. Plan - RASS goal -1 to -2  Thrombocytopenia from sepsis. Plan - hold lovenox - f/u CBC  Best practice:  Diet: NPO DVT prophylaxis: SCDs GI prophylaxis: Protonix Mobility: Bed rest Code Status: Full code Disposition: ICU  Labs    CMP Latest Ref Rng & Units 11/29/2018 11/22/2018 11/22/2018  Glucose 70 - 99 mg/dL 115(H) 312(H) 145(H)  BUN 8 - 23 mg/dL 35(H) 30(H) 24(H)  Creatinine 0.44 - 1.00 mg/dL 3.16(H) 2.62(H) 1.71(H)  Sodium 135 - 145 mmol/L 144 139 145  Potassium 3.5 - 5.1 mmol/L 4.4 5.3(H) 5.0  Chloride 98 - 111 mmol/L 115(H) 114(H) 122(H)  CO2 22 - 32 mmol/L 13(L) 13(L) 19(L)  Calcium 8.9 - 10.3 mg/dL 5.6(LL) 5.9(LL) 5.9(LL)  Total Protein 6.5 - 8.1 g/dL - - -  Total Bilirubin 0.3 - 1.2 mg/dL - - -  Alkaline Phos 38 - 126 U/L - - -  AST 15 - 41 U/L - - -  ALT 0 - 44 U/L - - -   CBC Latest Ref Rng & Units 12/03/2018 11/22/2018 12/02/2018  WBC 4.0 - 10.5 K/uL 13.1(H) 9.4 5.0  Hemoglobin 12.0 - 15.0 g/dL  12.6 13.1 9.8(L)  Hematocrit 36.0 - 46.0 % 40.0 40.5 30.3(L)  Platelets 150 - 400 K/uL 33(L) 69(L) 95(L)   ABG    Component Value Date/Time   PHART 7.264 (L) 11/15/2018 0531   PCO2ART 28.8 (L) 12/09/2018 0531   PO2ART 118 (H) 12/12/2018 0531   HCO3 12.3 (L) 12/04/2018 0531   TCO2 16 (L) 12/12/2018 1119   ACIDBASEDEF 12.7 (H) 12/07/2018 0531   O2SAT 96.7 11/24/2018 0531   CBG (last 3)  Recent Labs    11/24/2018 0409 11/16/2018 0721 11/19/2018 0752  GLUCAP 74 65* 117*   CC time 34 minutes  Chesley Mires, MD [place] 12/09/2018, 8:10 AM

## 2018-11-23 NOTE — Procedures (Signed)
Hemodialysis Catheter Insertion Procedure Note Carmen Stone [id number] [birth year]  Procedure: Insertion of Hemodialysis Catheter Indications: Dialysis Access   Procedure Details Consent: Risks of procedure as well as the alternatives and risks of each were explained to the (patient/caregiver).  Consent for procedure obtained. Time Out: Verified patient identification, verified procedure, site/side was marked, verified correct patient position, special equipment/implants available, medications/allergies/relevent history reviewed, required imaging and test results available.  Performed Real time [place] was used to ID and cannulate the vessel  Maximum sterile technique was used including antiseptics, cap, gloves, gown, hand hygiene, mask and sheet. Skin prep: Chlorhexidine; local anesthetic administered Triple lumen hemodialysis catheter was inserted into right internal jugular vein using the Seldinger technique.  Evaluation Blood flow good Complications: No apparent complications Patient did tolerate procedure well. Chest X-ray ordered to verify placement.  CXR: pending.  After successful placement of these lines. The right IJ CVL (double lumen) was removed. Dressing then placed. 1.2 ml of 1000U/ml Heparin installed to dwell Both the venous and arterial port of the HD cath   Clementeen Graham 11/19/2018  Erick Colace ACNP-BC [place] Pager # [phone] OR # [phone] if no answer

## 2018-11-23 NOTE — Progress Notes (Addendum)
Pharmacy Antibiotic Note  Carmen Stone is a 63 y.o. female admitted on 11/20/2018 with perforated loop of SB.  Pharmacy had been consulted for zosyn and fluconazole dosing. 6/7: perforated small bowel from closed-loop obstruction. S/p exp lap. SBR, LOA, placement of wound VAC.   Today, 11/26/2018  Day 3 Zosyn and Diflucan  Tmax 102  WBC 13.1 rising  Now in ARF - SCr up to 3.16 est CrCl 18 - plan now to start CRRT  Plan:  Change Zosyn to 2.25g IV q6 - this remains appropriate dose for CRRT as well  Continue Diflucan 400mg  IV q24 for CRRT dosing  F/u renal fxn, clinical course  Height: 5\' 4"  (162.6 cm) Weight: 165 lb (74.8 kg) IBW/kg (Calculated) : 54.7  Temp (24hrs), Avg:99.7 F (37.6 C), Min:98.3 F (36.8 C), Max:102 F (38.9 C)  Recent Labs  Lab 12/01/2018 0355 12/01/2018 1628 12/09/2018 2330 11/22/18 0400 11/22/18 0700 11/22/18 1700 11/17/2018 0410  WBC 22.3* 2.7* 5.0  --  9.4  --  13.1*  CREATININE 0.73  --   --  1.71*  --  2.62* 3.16*    Estimated Creatinine Clearance: 17.8 mL/min (A) (by C-G formula based on SCr of 3.16 mg/dL (H)).    No Known Allergies  Antimicrobials this admission: 6/7 zosyn> 6/7 fluconazole>  Microbiology results: 6/7 Covid 19 negative  Thank you for allowing pharmacy to be a part of this patient's care.  Adrian Saran, PharmD, BCPS 11/15/2018 7:46 AM

## 2018-11-23 NOTE — Progress Notes (Signed)
[facility] Progress Note Patient Name: ROCHELL PUETT DOB: 10-May-1956 MRN: [id number]   Date of Service  11/20/2018  HPI/Events of Note  Hypothermia - Temp = 95.8 F. Request for [employer].   eICU Interventions  Will order [employer].     Intervention Category Major Interventions: Other:  Joshia Kitchings Cornelia Copa 11/18/2018, 11:22 PM

## 2018-11-23 NOTE — Progress Notes (Signed)
Hypoglycemic Event  CBG: 68  Treatment: 12.5g of D50  Symptoms: none: intubated and sedated  Follow-up CBG: Time: 0400 CBG Result: 74  Possible Reasons for Event: Unstable patient on vent.  Comments/MD notified: E-Link    Naquita Nappier

## 2018-11-23 NOTE — Progress Notes (Signed)
Patient transported to OR by anesthesia team, 3 CRNA present.

## 2018-11-23 NOTE — Progress Notes (Signed)
CRITICAL VALUE ALERT  Critical Value:  Calcium 5.6  Date & Time Notied:  11/20/2018 0530  Provider Notified: E-link  Orders Received/Actions taken: No new orders at this time

## 2018-11-23 NOTE — Progress Notes (Signed)
[facility] Progress Note Patient Name: DRAKE LANDING DOB: 1956-04-21 MRN: [id number]   Date of Service  11/29/2018  HPI/Events of Note  Multiple issues: 1. Lactic Acid = 9.5 - Hgb = 10.9 and CVP = 14. BP = 135/88, 2. Hypocalcemia - Ca++ = 6.2 and 3. Last pH = 7.102.   eICU Interventions  Will order: 1. COOX now. 2. NaHCO3 100 meq IV now. 3. Increase NaHCO3 IV infusion rate to 75 mL/hour.  4. Repeat ABG at 11 PM. 5. Replace Ca++.      Intervention Category Major Interventions: Acid-Base disturbance - evaluation and management;Electrolyte abnormality - evaluation and management  Sommer,Steven Eugene 12/07/2018, 8:58 PM

## 2018-11-23 NOTE — Progress Notes (Signed)
Hypoglycemic Event  CBG: 65 at 0721  Treatment: dextrose 50% solution at 0730  Symptoms: Unable to assess; pt intubated/sedated  Follow-up CBG: Time:0752 CBG Result:117  Possible Reasons for Event: NPO

## 2018-11-23 NOTE — Progress Notes (Signed)
Hypoglycemic Event  CBG: 68 at 1523  Treatment: 29mL of dextrose 50% solution at 1526  Symptoms: Unable to assess; pt intubated/sedated  Follow-up CBG: Time:1542 CBG Result:147  Possible Reasons for Event: NPO

## 2018-11-23 NOTE — Anesthesia Preprocedure Evaluation (Signed)
Anesthesia Evaluation  Patient identified by MRN, date of birth, ID band Patient confused  General Assessment Comment:Patient minimally responsive  Reviewed: Allergy & Precautions, NPO status , Patient's Chart, lab work & pertinent test results, Unable to perform ROS - Chart review onlyPreop documentation limited or incomplete due to emergent nature of procedure.  Airway Mallampati: II  TM Distance: >3 FB Neck ROM: Full    Dental  (+) Dental Advisory Given, Edentulous Upper, Edentulous Lower   Pulmonary neg pulmonary ROS,    Pulmonary exam normal breath sounds clear to auscultation       Cardiovascular hypertension, Pt. on medications  Rhythm:Regular Rate:Tachycardia     Neuro/Psych negative neurological ROS  negative psych ROS   GI/Hepatic Neg liver ROS, ruptured bowel She has perforated small bowel with a large amount of free air as well as multiple incarcerated incisional hernias.    Endo/Other  diabetes, Type 2, Insulin Dependent, Oral Hypoglycemic AgentsCBG 495  Renal/GU Renal disease     Musculoskeletal negative musculoskeletal ROS (+)   Abdominal   Peds  Hematology negative hematology ROS (+)   Anesthesia Other Findings Day of surgery medications reviewed with the patient.  Inflammatory breast cancer s/p chemo/radiation  Reproductive/Obstetrics                             Anesthesia Physical  Anesthesia Plan  ASA: IV  Anesthesia Plan: General   Post-op Pain Management:    Induction: Intravenous and Rapid sequence  PONV Risk Score and Plan: 4 or greater and Ondansetron, Treatment may vary due to age or medical condition and Propofol infusion  Airway Management Planned: Oral ETT  Additional Equipment: Arterial line and CVP  Intra-op Plan:   Post-operative Plan: Possible Post-op intubation/ventilation  Informed Consent: I have reviewed the patients History and Physical,  chart, labs and discussed the procedure including the risks, benefits and alternatives for the proposed anesthesia with the patient or authorized representative who has indicated his/her understanding and acceptance.     Dental advisory given, Only emergency history available and History available from chart only  Plan Discussed with: CRNA  Anesthesia Plan Comments:         Anesthesia Quick Evaluation

## 2018-11-23 NOTE — Consult Note (Signed)
Cardiology Consultation:   Patient ID: Carmen Stone MRN: [id number]; DOB: 1955-11-13  Admit date: 11/29/2018 Date of Consult: 12/12/2018  Primary Care Provider: Jettie Booze, NP Primary Cardiologist: Buford Dresser, MD, new Primary Electrophysiologist:  None    Patient Profile:   Carmen Stone is a 63 y.o. female with a hx of breast cancer s/p chemoradiation 2015, HTN, HLD, and DM2 who is being seen today for the evaluation of stress cardiomyopathy at the request of Dr. Halford Chessman.  History of Present Illness:   Ms. Gass presented to [facility] with nausea, vomiting, and abdominal pain found to have focal perforated SB loop with free air and multiple complex ventral hernias. She underwent ex lap and small bowel resection on . Wound vac was placed and she remains intubated on ventilator. She required pressors post-op for pressure support. She remains in septic shock secondary to peritonitis and small bowel perforation. Pt had sinus tachycardia and echocardiogram was obtained. TSH was elevated. Echo showed LVEF of 20%, presumably a new diagnosis. Wall motion abnormality consistent with stress cardiomyopathy vs LAD-territory infarct. Cardiology was consulted.    EKG with sinus rhythm.   COVID-19 negative  Level 5 Caveat. Pt intubated. She remains on three pressors (levophed, neo, and vasopressin). ABX per primary team. Intubated for surgery, not able to extubate. Limited echo was obtained yesterday when pt was sinus tach in the 130-140s - may need to repeat limited echo as heart rate improves. No beta blockers. HR now in the low 100s.   I called and spoke with her brother, Iona Beard. He states she had not complained of anginal symptoms prior to this illness. There is no heart disease in the family (parents or siblings). HTN, HLD, and DM were her only known medical problems and she did not previously follow with a cardiologist.   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   Radiation 06/20/14-07/28/14    Past Surgical History:  Procedure Laterality Date   BOWEL RESECTION N/A 12/02/2018   Procedure: SMALL BOWEL RESECTION;  Surgeon: Coralie Keens, MD;  Location: [facility];  Service: General;  Laterality: N/A;   BREAST BIOPSY Right 10/22/2013   Procedure: BREAST BIOPSY;  Surgeon: [name], MD;  Location: [place];  Service: General;  Laterality: Right;   CESAREAN SECTION     x 1   CHOLECYSTECTOMY  2011   DRESSING CHANGE UNDER ANESTHESIA Right 10/26/2013   Procedure: COMPLETION MASTECTOMY, AXILLARY DISSECTION;  Surgeon: [name], MD;  Location: [place];  Service: General;  Laterality: Right;   INCISION AND DRAINAGE OF WOUND Right 10/22/2013   Procedure: IRRIGATION AND DRAINAGE AND DEBRIDEMENT RIGHT BREAST WOUND;  Surgeon: [name], MD;  Location: [place];  Service: General;  Laterality: Right;   IRRIGATION AND DEBRIDEMENT ABSCESS Right 10/24/2013   Procedure: Right total mastectomy;  Surgeon: [name], MD;  Location: [place];  Service: General;  Laterality: Right;   LAPAROTOMY N/A 11/15/2018   Procedure: EXPLORATORY LAPAROTOMY;  Surgeon: Coralie Keens, MD;  Location: [facility];  Service: General;  Laterality: N/A;   LYSIS OF ADHESION N/A 12/12/2018   Procedure: LYSIS OF ADHESIONS;  Surgeon: Coralie Keens, MD;  Location: [facility];  Service: General;  Laterality: N/A;   PORTACATH PLACEMENT N/A 11/16/2013   Procedure: INSERTION PORT-A-CATH;  Surgeon: [name], MD;  Location: [place];  Service: General;  Laterality: N/A;   VENTRAL HERNIA REPAIR  2011   repair incacerated VH with biologic mesh; cholecystectomy     Home Medications:  Prior to Admission medications   Medication Sig Start Date End Date Taking? Authorizing Provider  atorvastatin (LIPITOR) 10 MG tablet Take 10 mg by mouth daily.   Yes  [provider]  acetaminophen (TYLENOL) 325 MG tablet Take 650 mg by mouth every 6 (six) hours as needed for mild pain.    [provider]  Continuous Blood Gluc Receiver (FREESTYLE LIBRE 14 DAY READER) DEVI daily. 08/09/18   [provider]  Continuous Blood Gluc Sensor (FREESTYLE LIBRE 14 DAY SENSOR) MISC Place 1 Device onto the skin every 14 (fourteen) days. 10/25/18   [provider]  glucose blood test strip Use as instructed 11/07/13   Cristal Ford, DO  glucose monitoring kit (FREESTYLE) monitoring kit 1 each by Does not apply route as needed for other. Check blood sugars daily. 11/07/13   Mikhail, Velta Addison, DO  Insulin Glargine (LANTUS SOLOSTAR) 100 UNIT/ML Solostar Pen Inject 10 Units into the skin daily at 10 pm. Patient not taking: Reported on 12/05/2018 03/20/14   Bernadene Bell, MD  levothyroxine (SYNTHROID) 25 MCG tablet Take 25 mcg by mouth daily. 11/03/18   [provider]  lidocaine-prilocaine (EMLA) cream Apply 1 application topically as needed. Patient not taking: Reported on 12/12/2018 04/24/14   Bernadene Bell, MD  LORazepam (ATIVAN) 0.5 MG tablet Take 1 tablet (0.5 mg total) by mouth at bedtime as needed (Nausea or vomiting). Patient not taking: Reported on 11/30/2018 01/09/14   Gardenia Phlegm, NP  metFORMIN (GLUCOPHAGE-XR) 500 MG 24 hr tablet Take 500 mg by mouth 2 (two) times daily with a meal. 10/30/18   [provider]  NOVOLOG MIX 70/30 FLEXPEN (70-30) 100 UNIT/ML FlexPen Inject 20 Units into the muscle 2 (two) times daily with a meal. 10/15/18   [provider]  pravastatin (PRAVACHOL) 10 MG tablet Take 10 mg by mouth daily.  05/18/15   [provider]    Inpatient Medications: Scheduled Meds:  acetaminophen  1,000 mg Oral On Call to OR   chlorhexidine gluconate (MEDLINE KIT)  15 mL Mouth Rinse BID   Chlorhexidine Gluconate Cloth  6 each Topical Daily   dorzolamide-timolol  1 drop Both Eyes BID    hydrocortisone sod succinate (SOLU-CORTEF) inj  50 mg Intravenous Q6H   insulin aspart  3-9 Units Subcutaneous Q4H   mouth rinse  15 mL Mouth Rinse 10 times per day   pantoprazole (PROTONIX) IV  40 mg Intravenous Q24H   Continuous Infusions:   prismasol BGK 4/2.5      prismasol BGK 4/2.5     sodium chloride Stopped (11/22/18 1801)   fentaNYL infusion INTRAVENOUS 125 mcg/hr (12/08/2018 0850)   fluconazole (DIFLUCAN) IV     norepinephrine (LEVOPHED) Adult infusion 65 mcg/min (11/30/2018 0856)   phenylephrine (NEO-SYNEPHRINE) Adult infusion 250 mcg/min (11/18/2018 0850)   piperacillin-tazobactam (ZOSYN)  IV     prismasol BGK 4/2.5     propofol (DIPRIVAN) infusion Stopped (11/26/2018 2220)    sodium bicarbonate  infusion 1000 mL 50 mL/hr at 12/09/2018 0850   vasopressin (PITRESSIN) infusion - *FOR SHOCK* 0.03 Units/min (11/26/2018 0850)   PRN Meds: sodium chloride, acetaminophen, albuterol, alteplase, [DISCONTINUED] diphenhydrAMINE **OR** diphenhydrAMINE, fentaNYL, heparin, sodium chloride, sodium chloride flush  Allergies:   No Known Allergies  Social History:   Social History   Socioeconomic History   Marital status: Single  Spouse name: Not on file   Number of children: 1   Years of education: Not on file   Highest education level: Not on file  Occupational History   Occupation: maid, Training and development officer  Social Designer, fashion/clothing strain: Not on file   Food insecurity:    Worry: Not on file    Inability: Not on file   Transportation needs:    Medical: Not on file    Non-medical: Not on file  Tobacco Use   Smoking status: Never Smoker   Smokeless tobacco: Never Used  Substance and Sexual Activity   Alcohol use: No    Alcohol/week: 0.0 standard drinks   Drug use: No   Sexual activity: Not on file    Comment: menarche age 54, first live birth age 40, P73, menopause age 65, no HRT  Lifestyle   Physical activity:    Days per week: Not on file    Minutes per  session: Not on file   Stress: Not on file  Relationships   Social connections:    Talks on phone: Not on file    Gets together: Not on file    Attends religious service: Not on file    Active member of club or organization: Not on file    Attends meetings of clubs or organizations: Not on file    Relationship status: Not on file   Intimate partner violence:    Fear of current or ex partner: Not on file    Emotionally abused: Not on file    Physically abused: Not on file    Forced sexual activity: Not on file  Other Topics Concern   Not on file  Social History Narrative   Not on file    Family History:    Family History  Problem Relation Age of Onset   Lung cancer Mother        smoker   Emphysema Father        smoker   Colon cancer Neg Hx      ROS:  Please see the history of present illness.   All other ROS reviewed and negative.     Physical Exam/Data:   Vitals:   12/05/2018 0800 11/26/2018 0815 11/30/2018 0830 12/02/2018 0845  BP: 127/63 (!) 104/35 (!) 110/43 119/83  Pulse: (!) 105 (!) 105 (!) 118 (!) 111  Resp:      Temp: (!) 101 F (38.3 C)     TempSrc: Oral     SpO2: 98% 95% 95% 95%  Weight:      Height:        Intake/Output Summary (Last 24 hours) at 11/18/2018 0909 Last data filed at 11/26/2018 0850 Gross per 24 hour  Intake 6932.76 ml  Output 1390 ml  Net 5542.76 ml   Last 3 Weights 12/01/2018 06/02/2018 06/01/2017  Weight (lbs) 165 lb 168 lb 6.4 oz 165 lb 12.8 oz  Weight (kg) 74.844 kg 76.386 kg 75.206 kg     Body mass index is 28.32 kg/m.  General:  intubated HEENT: normal Neck: exam difficult Endocrine:  No thyromegaly, but exam difficult Vascular: No carotid bruits Cardiac:  normal S1, S2; sinus tachycardia Lungs:  Intubated on vent  Abd: surgical site with wound vac in place Ext: + edema Musculoskeletal:  No deformities Skin: warm and dry  Neuro:  intaubated Psych: sedated   EKG:  The EKG was personally reviewed and demonstrates:   Sinus rhythm with HR 93 Telemetry:  Telemetry was personally reviewed  and demonstrates:  Sinus now with HR in the low 100s  Relevant CV Studies:  Echo 11/22/18: 1. The left ventricle had a visually estimated ejection fraction of of 20%. The cavity size was normal. The mid-apical LV wall segments were severely hypokinetic, the basal segments were relatively preserved. This suggests a large LAD-territory  infarction versus stress (Takotsubo-type) cardiomyopathy.  2. The aortic root is normal in size and structure.  3. The right ventricle has normal systolc function. The cavity was normal. There is no increase in right ventricular wall thickness.  4. Limited echo.  Laboratory Data:  Chemistry Recent Labs  Lab 11/22/18 0400 11/22/18 1700 11/19/2018 0410  NA 145 139 144  K 5.0 5.3* 4.4  CL 122* 114* 115*  CO2 19* 13* 13*  GLUCOSE 145* 312* 115*  BUN 24* 30* 35*  CREATININE 1.71* 2.62* 3.16*  CALCIUM 5.9* 5.9* 5.6*  GFRNONAA 31* 19* 15*  GFRAA 36* 22* 17*  ANIONGAP 4* 12 16*    Recent Labs  Lab 12/05/2018 0355  PROT 7.9  ALBUMIN 4.2  AST 17  ALT 18  ALKPHOS 115  BILITOT 0.5   Hematology Recent Labs  Lab 11/20/2018 2330 11/22/18 0700 11/15/2018 0410  WBC 5.0 9.4 13.1*  RBC 3.18* 4.35 4.26  HGB 9.8* 13.1 12.6  HCT 30.3* 40.5 40.0  MCV 95.3 93.1 93.9  MCH 30.8 30.1 29.6  MCHC 32.3 32.3 31.5  RDW 13.1 14.2 15.2  PLT 95* 69* 33*   Cardiac EnzymesNo results for input(s): TROPONINI in the last 168 hours. No results for input(s): TROPIPOC in the last 168 hours.  BNPNo results for input(s): BNP, PROBNP in the last 168 hours.  DDimer No results for input(s): DDIMER in the last 168 hours.  Radiology/Studies:  Ct Abdomen Pelvis W Contrast  Result Date: 12/01/2018 CLINICAL DATA:  Patient with abdominal pain, nausea and vomiting. EXAM: CT ABDOMEN AND PELVIS WITH CONTRAST TECHNIQUE: Multidetector CT imaging of the abdomen and pelvis was performed using the standard protocol following  bolus administration of intravenous contrast. CONTRAST:  173m OMNIPAQUE IOHEXOL 300 MG/ML  SOLN COMPARISON:  Acute abdominal series earlier same day FINDINGS: Lower chest: Normal heart size. Dependent atelectasis within the bilateral lower lobes. Hepatobiliary: The liver is normal in size and contour. Subcentimeter too small to characterize low-attenuation lesion right hepatic lobe (image 22; series 2). Gallbladder surgically absent. Pancreas: Unremarkable Spleen: Unremarkable Adrenals/Urinary Tract: Normal adrenal glands. Kidneys enhance symmetrically with contrast. No hydronephrosis. Urinary bladder mildly thick walled. Stomach/Bowel: There is a focally dilated loop of small bowel within the central abdomen containing small bowel feces. There is at least 1 focal perforation of this small bowel loop with extensive adjacent intraperitoneal fluid and gas (image 70; series 5). The inferior portion of the small bowel loop appears herniated within the left lower anterior abdominal wall (image 60; series 2). The more cranial portion of the small bowel loop appears to be involved within an additional abdominal wall hernia more cranially (image 38; series 2). There is extensive free intraperitoneal gas throughout the abdomen as well with small amount of free intraperitoneal fluid within the lower abdomen. Sigmoid colonic diverticulosis. A portion of the transverse colon is located within an abdominal wall hernia without definite evidence for obstruction (image 29; series 2). Vascular/Lymphatic: Normal caliber abdominal aorta. No retroperitoneal lymphadenopathy. Reproductive: Uterus is surgically absent. Other: Multiple complex small and large bowel containing ventral abdominal wall hernias are demonstrated. Musculoskeletal: Lower lumbar spine and thoracic spine degenerative changes. Right hip  joint degenerative changes. IMPRESSION: 1. There is a focally perforated loop of small bowel within the central abdomen with  extensive free intraperitoneal gas and fluid. This may be a closed loop obstruction of the small bowel loop as the inferior aspect of this loop is contained within a lower anterior abdominal wall hernia and the more cranial portion of the small bowel loop is contained within a central anterior abdominal wall hernia. 2. Multiple complex ventral abdominal hernias containing both small and large bowel. Critical Value/emergent results were called by telephone at the time of interpretation on 12/09/2018 at 7:49 am to Dr. Sedonia Small, who verbally acknowledged these results. Electronically Signed   By: Lovey Newcomer M.D.   On: 11/22/2018 07:58   Dg Chest Port 1 View  Result Date: 11/30/2018 CLINICAL DATA:  Postop exploratory laparotomy. Central line and endotracheal tube placement check EXAM: PORTABLE CHEST 1 VIEW COMPARISON:  11/22/2018 FINDINGS: There is a right sided IJ catheter with tip in the projection of the cavoatrial junction. Left chest wall port a catheter tip is at the distal SVC. ETT tip is stable, 2.8 cm above carina. NG tube in place. Normal heart size. There are small bilateral pleural effusions which appear increased from previous exam. Associated diminished aeration to both lung bases. IMPRESSION: 1. Stable position of support apparatus. 2. Increase in bilateral pleural effusions. Electronically Signed   By: Kerby Moors M.D.   On: 12/12/2018 08:19   Dg Chest Port 1 View  Result Date: 11/22/2018 CLINICAL DATA:  Encounter for intubation. EXAM: PORTABLE CHEST 1 VIEW COMPARISON:  One-view chest x-ray 12/09/2018 FINDINGS: The endotracheal tube is been pulled back, now terminates 3.5 cm above the carina. A left subclavian Port-A-Cath is stable. A right IJ line is stable. Lung volumes are low. New bilateral lower lobe opacities are present. Small effusions are present bilaterally. Surgical clips are again noted in the right axilla. IMPRESSION: 1. Repositioning of endotracheal tube, now 3.5 cm above the carina. 2.  Progressive bibasilar airspace opacities and probable small effusions. While this may represent atelectasis, infection is not excluded. 3. Left subclavian Port-A-Cath and right IJ line are stable. Electronically Signed   By: San Morelle M.D.   On: 11/22/2018 07:46   Dg Chest Port 1 View  Result Date: 12/09/2018 CLINICAL DATA:  Postop exploratory laparotomy with central line and endotracheal tube placement check. EXAM: PORTABLE CHEST 1 VIEW COMPARISON:  12/12/2018 FINDINGS: Left subclavian Port-A-Cath unchanged. Interval placement of right IJ central venous catheter with tip over the SVC. Endotracheal tube is present with tip 5 mm above the carina at the origin of the right mainstem bronchus. This could be pulled back 3 cm. Enteric tube is present with tip over the stomach in the left upper quadrant. Lungs are hypoinflated and otherwise clear. Cardiomediastinal silhouette and remainder of the exam is unchanged. IMPRESSION: Hypoinflation without acute cardiopulmonary disease. Multiple tubes and lines as described. Note that the endotracheal tube has tip 5 mm above the carina at the origin of the right mainstem bronchus and could be pulled back approximately 3 cm. These results were called by telephone at the time of interpretation on 12/12/2018 at 1:47 pm to patient's nurse, Legrand Como, who verbally acknowledged these results. Electronically Signed   By: Marin Olp M.D.   On: 11/17/2018 13:48   Dg Abdomen Acute W/chest  Result Date: 11/20/2018 CLINICAL DATA:  Abdominal pain, nausea and vomiting. EXAM: DG ABDOMEN ACUTE W/ 1V CHEST COMPARISON:  Chest radiograph 11/16/2013 FINDINGS: Left anterior chest  wall Port-A-Cath is present with tip projecting over the superior vena cava. Monitoring leads overlie the patient. Stable cardiac and mediastinal contours. Low lung volumes with bibasilar atelectasis. No large area pulmonary consolidation. Large amount of stool throughout the colon. Multiple gaseous distended  loops of bowel throughout the central abdomen. No definite free intraperitoneal air. Lumbar spine degenerative changes. IMPRESSION: Multiple gaseous distended loops of bowel throughout the abdomen raising the possibility of ileus or possible early bowel obstruction. Large amount of stool throughout the colon as can be seen with constipation. Electronically Signed   By: Lovey Newcomer M.D.   On: 12/03/2018 05:42    Assessment and Plan:   1. New onset systolic heart failure 2. 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 Sepsis secondary to SB perforation and peritonitis 4. Acute kidney injury - pt remains intubated on three pressors - EF 20% with limited echo, but during sinus tachycardia in the 130-140s - question LAD territory infarct vs stress cardiomyopathy - troponin not collected, but likely little utility as it would reflect at least demand ischemia - no EKG changes to suggest infarct - not a candidate for invasive workup at this time given overall clinical picture, including sCr of 3.16 today - will repeat echo in 1-2 days as ST resolves - repeat EKG daily - caution with extra fluids given low EF   5. Hypothyroidism - TSH elevated - per primary   6. Hypertension - home medications on hold - 3 pressors   7. Hyperlipidemia - pravachol and lipitor listed in home medications - reconcile and restart when awake and taking orals      For questions or updates, please contact [facility] Please consult [email or web address] for contact info under     Signed, Ledora Bottcher, PA  11/26/2018 9:09 AM

## 2018-11-23 NOTE — Progress Notes (Signed)
Hypoglycemic Event  CBG: 65 at 1157  Treatment: 25 mL of dextrose 50% solution at 1200  Symptoms: Unable to assess; pt intubated/sedated  Follow-up CBG: Time: 1217 CBG Result:126  Possible Reasons for Event: NPO

## 2018-11-23 NOTE — Progress Notes (Addendum)
2 Days Post-Op   Subjective/Chief Complaint: Pt con't to req pressors overnight   Objective: Vital signs in last 24 hours: Temp:  [98.3 F (36.8 C)-102 F (38.9 C)] 99.5 F (37.5 C) (06/09 0645) Pulse Rate:  [109-139] 109 (06/09 0600) Resp:  [22-24] 24 (06/08 1553) BP: (89-143)/(39-79) 143/65 (06/09 0600) SpO2:  [93 %-99 %] 99 % (06/09 0600) FiO2 (%):  [40 %] 40 % (06/09 0400)    Intake/Output from previous day: 06/08 0701 - 06/09 0700 In: 7139.8 [I.V.:6228.4; Blood:338; IV Piggyback:573.4] Out: 1540 [Urine:115; Emesis/NG output:275; Drains:1150] Intake/Output this shift: No intake/output data recorded.  General appearance: sedated Resp: coarse BS bila Cardio: regular rate and rhythm, S1, S2 normal, no murmur, click, rub or gallop GI: abd vac in place  Lab Results:  Recent Labs    11/22/18 0700 11/20/2018 0410  WBC 9.4 13.1*  HGB 13.1 12.6  HCT 40.5 40.0  PLT 69* 33*   BMET Recent Labs    11/22/18 1700 11/20/2018 0410  NA 139 144  K 5.3* 4.4  CL 114* 115*  CO2 13* 13*  GLUCOSE 312* 115*  BUN 30* 35*  CREATININE 2.62* 3.16*  CALCIUM 5.9* 5.6*   PT/INR Recent Labs    11/19/2018 1048 11/18/2018 2336  LABPROT 15.2 17.0*  INR 1.2 1.4*   ABG Recent Labs    11/20/2018 0030 12/05/2018 0531  PHART 7.287* 7.264*  HCO3 12.5* 12.3*    Studies/Results: Dg Chest Port 1 View  Result Date: 11/22/2018 CLINICAL DATA:  Encounter for intubation. EXAM: PORTABLE CHEST 1 VIEW COMPARISON:  One-view chest x-ray 12/01/2018 FINDINGS: The endotracheal tube is been pulled back, now terminates 3.5 cm above the carina. A left subclavian Port-A-Cath is stable. A right IJ line is stable. Lung volumes are low. New bilateral lower lobe opacities are present. Small effusions are present bilaterally. Surgical clips are again noted in the right axilla. IMPRESSION: 1. Repositioning of endotracheal tube, now 3.5 cm above the carina. 2. Progressive bibasilar airspace opacities and probable small  effusions. While this may represent atelectasis, infection is not excluded. 3. Left subclavian Port-A-Cath and right IJ line are stable. Electronically Signed   By: San Morelle M.D.   On: 11/22/2018 07:46   Dg Chest Port 1 View  Result Date: 12/12/2018 CLINICAL DATA:  Postop exploratory laparotomy with central line and endotracheal tube placement check. EXAM: PORTABLE CHEST 1 VIEW COMPARISON:  12/01/2018 FINDINGS: Left subclavian Port-A-Cath unchanged. Interval placement of right IJ central venous catheter with tip over the SVC. Endotracheal tube is present with tip 5 mm above the carina at the origin of the right mainstem bronchus. This could be pulled back 3 cm. Enteric tube is present with tip over the stomach in the left upper quadrant. Lungs are hypoinflated and otherwise clear. Cardiomediastinal silhouette and remainder of the exam is unchanged. IMPRESSION: Hypoinflation without acute cardiopulmonary disease. Multiple tubes and lines as described. Note that the endotracheal tube has tip 5 mm above the carina at the origin of the right mainstem bronchus and could be pulled back approximately 3 cm. These results were called by telephone at the time of interpretation on 12/02/2018 at 1:47 pm to patient's nurse, Legrand Como, who verbally acknowledged these results. Electronically Signed   By: Marin Olp M.D.   On: 11/22/2018 13:48    Anti-infectives: Anti-infectives (From admission, onward)   Start     Dose/Rate Route Frequency Ordered Stop   12/07/2018 0600  cefoTEtan (CEFOTAN) 2 g in sodium chloride 0.9 %  100 mL IVPB     2 g 200 mL/hr over 30 Minutes Intravenous On call to O.R. 11/22/18 1300 12/04/2018 0624   11/22/18 1800  fluconazole (DIFLUCAN) IVPB 400 mg     400 mg 100 mL/hr over 120 Minutes Intravenous Every 24 hours 12/06/2018 1528     11/22/18 1800  vancomycin (VANCOCIN) 1,500 mg in sodium chloride 0.9 % 500 mL IVPB  Status:  Discontinued     1,500 mg 250 mL/hr over 120 Minutes  Intravenous Every 24 hours 11/22/18 0358 11/22/18 0750   11/22/18 0115  vancomycin (VANCOCIN) 1,500 mg in sodium chloride 0.9 % 500 mL IVPB     1,500 mg 250 mL/hr over 120 Minutes Intravenous  Once 11/22/18 0101 11/22/18 0319   11/22/2018 1630  fluconazole (DIFLUCAN) IVPB 800 mg     800 mg 100 mL/hr over 240 Minutes Intravenous  Once 12/07/2018 1527 12/12/2018 2013   11/29/2018 1600  piperacillin-tazobactam (ZOSYN) IVPB 3.375 g     3.375 g 12.5 mL/hr over 240 Minutes Intravenous Every 8 hours 12/14/2018 1522     11/24/2018 0545  piperacillin-tazobactam (ZOSYN) IVPB 3.375 g     3.375 g 100 mL/hr over 30 Minutes Intravenous  Once 12/12/2018 0530 11/26/2018 0626   12/12/2018 0530  piperacillin-tazobactam (ZOSYN) IVPB 4.5 g  Status:  Discontinued     4.5 g 200 mL/hr over 30 Minutes Intravenous  Once 12/11/2018 0517 11/17/2018 0529      Assessment/Plan: HTN HLD DM Hypothyroidism 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 Ninfa Linden - POD 2 - Appreciate CCM assistance. Plan return to OR today to eval for visceral ischemia. -Continue full vent support.  -Continue broad spectrum antibiotics. -I updated patient's brother in the room.  ID - zosyn 6/7>>, diflucan 6/7>>, vancomycin 6/8>>6/8 FEN - IVF, NPO/NGT  VTE - SCDs, lovenox on hold for thrombocytopenia Foley - in place Contact - brother Iona Beard [phone] or [phone])   CC time: 25min  LOS: 2 days    Ralene Ok 11/20/2018

## 2018-11-23 NOTE — Progress Notes (Addendum)
Inpatient Diabetes Program Recommendations  AACE/ADA: New Consensus Statement on Inpatient Glycemic Control (2015)  Target Ranges:  Prepandial:   less than 140 mg/dL      Peak postprandial:   less than 180 mg/dL (1-2 hours)      Critically ill patients:  140 - 180 mg/dL   Results for BENNIE, SCAFF (MRN [id number]) as of 12/09/2018 07:43  Ref. Range 11/22/2018 07:55 11/22/2018 11:48 11/22/2018 16:04 11/22/2018 19:53  Glucose-Capillary Latest Ref Range: 70 - 99 mg/dL 127 (H)  3 units NOVOLOG  180 (H)  6  units NOVOLOG  137 (H)  3 units NOVOLOG  137 (H)  3 units NOVOLOG    Results for CHARLISSA, PETROS (MRN [id number]) as of 12/01/2018 07:43  Ref. Range 11/22/2018 23:35 12/03/2018 03:33 11/18/2018 04:09 12/03/2018 07:21  Glucose-Capillary Latest Ref Range: 70 - 99 mg/dL 101 (H) 68 (L) 74 65 (L)    Home DM Meds: Metformin 500 mg BID       70/30 Insulin- 20 units BID   Current Orders: Novolog 3-6-9 units Q4 hours per the ICU Glycemic Control Protocol     Getting Solucortef 50 mg Q6 hours.     MD- Patient with Mild Hypoglycemia noted at 3:33am and again at 7:21am today.  Last dose Novolog given at 8pm last night (3 units Novolog).  Creatinine rising which may be predisposing patient to Hypoglycemia.  Please consider reducing Novolog SSi to the Standard Scale (2-4-6 units) Q4 hours     --Will follow patient during hospitalization--  Wyn Quaker RN, MSN, CDE Diabetes Coordinator Inpatient Glycemic Control Team Team Pager: [phone] (8a-5p)

## 2018-11-23 NOTE — Consult Note (Signed)
Renal Service Consult Note [facility]  Carmen Stone 12/09/2018 [place] Requesting Physician:  Dr. Halford Chessman, CCM  Reason for Consult:  AKI, septic shock HPI: The patient is a 63 y.o. year-old with hx of IDDM, HTN, breast Ca sp XRT, DM2 on insulin presented on 12/12/2018 with abd pain, N/V. CT sohwed perforated small bowel w/ free air and multiple incarcerated incisional hernias.  She was taken to OR that day for exlap w/ SB resection and LOA, placement of wound vac. UOP on 6/7 was good at 1.3L, but yest UOP dropped to 115 cc and today there is none. She has septic shock on 3 pressors and worsening renal failrue /w metabolic acidosis.  Asked to see for CRRT.    Pt unavailable to provide any hx.    In 2015 pt was admitted for R breast necrotizing soft tissue infection, new onset DM 2, SIRS and ventral incisional hernia.  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  Had AKI which resolved.    ROS n/a   Past Medical History  Past Medical History:  Diagnosis Date  . Diabetes mellitus, type II ([place])   . Hyperlipemia   . Hypertension   . Inflammatory breast cancer ([place]) 10/22/13   right  . Radiation 06/20/14-07/28/14   Past Surgical History  Past Surgical History:  Procedure Laterality Date  . BOWEL RESECTION N/A 12/09/2018   Procedure: SMALL BOWEL RESECTION;  Surgeon: Coralie Keens, MD;  Location: [facility];  Service: General;  Laterality: N/A;  . BREAST BIOPSY Right 10/22/2013   Procedure: BREAST BIOPSY;  Surgeon: [name], MD;  Location: [place];  Service: General;  Laterality: Right;  . CESAREAN SECTION     x 1  . CHOLECYSTECTOMY  2011  . DRESSING CHANGE UNDER ANESTHESIA Right 10/26/2013   Procedure: COMPLETION MASTECTOMY, AXILLARY DISSECTION;  Surgeon: [name], MD;  Location: [place];  Service: General;  Laterality: Right;  . INCISION AND DRAINAGE OF WOUND Right 10/22/2013   Procedure: IRRIGATION AND DRAINAGE AND  DEBRIDEMENT RIGHT BREAST WOUND;  Surgeon: [name], MD;  Location: [place];  Service: General;  Laterality: Right;  . IRRIGATION AND DEBRIDEMENT ABSCESS Right 10/24/2013   Procedure: Right total mastectomy;  Surgeon: [name], MD;  Location: [place];  Service: General;  Laterality: Right;  . LAPAROTOMY N/A 12/12/2018   Procedure: EXPLORATORY LAPAROTOMY;  Surgeon: Coralie Keens, MD;  Location: [facility];  Service: General;  Laterality: N/A;  . LYSIS OF ADHESION N/A 12/05/2018   Procedure: LYSIS OF ADHESIONS;  Surgeon: Coralie Keens, MD;  Location: [facility];  Service: General;  Laterality: N/A;  . PORTACATH PLACEMENT N/A 11/16/2013   Procedure: INSERTION PORT-A-CATH;  Surgeon: [name], MD;  Location: [place];  Service: General;  Laterality: N/A;  . VENTRAL HERNIA REPAIR  2011   repair incacerated VH with biologic mesh; cholecystectomy   Family History  Family History  Problem Relation Age of Onset  . Lung cancer Mother        smoker  . Emphysema Father        smoker  . Colon cancer Neg Hx    Social History  reports that she has never smoked. She has never used smokeless tobacco. She reports that she does not drink alcohol or use drugs. Allergies No Known Allergies Home medications Prior to Admission medications   Medication Sig Start Date End Date Taking? Authorizing Provider  atorvastatin (LIPITOR) 10 MG  tablet Take 10 mg by mouth daily.   Yes [provider]  acetaminophen (TYLENOL) 325 MG tablet Take 650 mg by mouth every 6 (six) hours as needed for mild pain.    [provider]  Continuous Blood Gluc Receiver (FREESTYLE LIBRE 14 DAY READER) DEVI daily. 08/09/18   [provider]  Continuous Blood Gluc Sensor (FREESTYLE LIBRE 14 DAY SENSOR) MISC Place 1 Device onto the skin every 14 (fourteen) days. 10/25/18   [provider]  glucose blood test strip Use as instructed 11/07/13   Cristal Ford, DO  glucose  monitoring kit (FREESTYLE) monitoring kit 1 each by Does not apply route as needed for other. Check blood sugars daily. 11/07/13   Mikhail, Velta Addison, DO  Insulin Glargine (LANTUS SOLOSTAR) 100 UNIT/ML Solostar Pen Inject 10 Units into the skin daily at 10 pm. Patient not taking: Reported on 11/22/2018 03/20/14   Bernadene Bell, MD  levothyroxine (SYNTHROID) 25 MCG tablet Take 25 mcg by mouth daily. 11/03/18   [provider]  lidocaine-prilocaine (EMLA) cream Apply 1 application topically as needed. Patient not taking: Reported on 12/04/2018 04/24/14   Bernadene Bell, MD  LORazepam (ATIVAN) 0.5 MG tablet Take 1 tablet (0.5 mg total) by mouth at bedtime as needed (Nausea or vomiting). Patient not taking: Reported on 11/29/2018 01/09/14   Gardenia Phlegm, NP  metFORMIN (GLUCOPHAGE-XR) 500 MG 24 hr tablet Take 500 mg by mouth 2 (two) times daily with a meal. 10/30/18   [provider]  NOVOLOG MIX 70/30 FLEXPEN (70-30) 100 UNIT/ML FlexPen Inject 20 Units into the muscle 2 (two) times daily with a meal. 10/15/18   [provider]  pravastatin (PRAVACHOL) 10 MG tablet Take 10 mg by mouth daily.  05/18/15   [provider]   Liver Function Tests Recent Labs  Lab 12/09/2018 0355  AST 17  ALT 18  ALKPHOS 115  BILITOT 0.5  PROT 7.9  ALBUMIN 4.2   Recent Labs  Lab 12/09/2018 0355  LIPASE 24   CBC Recent Labs  Lab 12/14/2018 0355  12/07/2018 2330 11/22/18 0700 12/14/2018 0410  WBC 22.3*   < > 5.0 9.4 13.1*  NEUTROABS 19.6*  --  3.8 6.6  --   HGB 14.1   < > 9.8* 13.1 12.6  HCT 43.5   < > 30.3* 40.5 40.0  MCV 92.8   < > 95.3 93.1 93.9  PLT 306   < > 95* 69* 33*   < > = values in this interval not displayed.   Basic Metabolic Panel Recent Labs  Lab 11/24/2018 0355 12/08/2018 1004 12/05/2018 1119 11/22/18 0400 11/22/18 1700 12/04/2018 0410  NA 136 146* 145 145 139 144  K 3.7 3.7 3.5 5.0 5.3* 4.4  CL 102  --   --  122* 114* 115*  CO2 23  --   --  19* 13* 13*  GLUCOSE  495*  --   --  145* 312* 115*  BUN 18  --   --  24* 30* 35*  CREATININE 0.73  --   --  1.71* 2.62* 3.16*  CALCIUM 9.2  --   --  5.9* 5.9* 5.6*  PHOS  --   --   --  2.6  --   --    Iron/TIBC/Ferritin/ %Sat No results found for: IRON, TIBC, FERRITIN, IRONPCTSAT  Vitals:   11/30/2018 1415 11/16/2018 1430 12/05/2018 1445 12/11/2018 1500  BP: 118/71 (!) 120/47 (!) 124/42 127/66  Pulse: (!) 112 (!) 108 Marland Kitchen)  105 (!) 106  Resp:      Temp:      TempSrc:      SpO2: 95% 96% 96% 96%  Weight:      Height:        Exam Gen on vent, sedated, ETT  No rash, cyanosis or gangrene Sclera anicteric, throat not seen  No jvd or bruits Chest clear bilat ant and lata RRR no MRG Abd soft ntnd no mass or ascites +bs, large midline wound vac  GU foley cath draining small amt amber clear urine MS no joint effusions or deformity Ext diffuse LE/ UE 2+ edema, no wounds or ulcers Neuro is sedated on the vent    Date   Creat  egfr  2011- 2015 0.6- 1.0 1 episode AKI , Cr 2.05 > 1.2 at dc  2016- 17  0.7- 1.1  2018- 19 0.7- 0.8  June 7  0.73  June 8  2.62  June 9  3.16     Home meds:  - insulin glargine 10u/ metformin 500 bid/ novolog 70/30 20u bid ac  - atorvastatin 10/ levothyroxine 25 ug/ pravastatin 10   Na 144 K 4.4  CO2 13  BUN 35  Cr 3.16   Mg 1.5    aBG 7.26/ 28/118      wbc 13k toxic granulation  Hb 12   plts 33k  UA neg on 6/7  UNa 35,  UCr 34  CXR 6/9 > bilat effusions and bilat atx   Assessment: 1. Acute renal failure - in setting of septic shock due to perf bowel , on 3 pressors. Suspect ATN.  Metabolic acidosis worsening. Recommend CRRT, see orders. Keep even.  2. Perforated small bowel - per CCM/ gen surg 3. Thrombocytopenia - order smear check for schistocytes 4. DM on insulin 5. Septic shock - due to #2 6. Vol overload - w/ bilat effusions on CXR and sig edema on exam    Plan: 1. CRRT orders written and d/w CCM doctors       Kelly Splinter  MD 12/08/2018, 3:20 PM

## 2018-11-23 NOTE — Progress Notes (Signed)
[place] Progress Note Patient Name: CHARIDY CAPPELLETTI DOB: June 04, 1956 MRN: [id number]   Date of Service  11/26/2018  HPI/Events of Note  Lactic Acidosis - Lactic Acid = 9.5 Hgb = 12.3 and CVP = 14. BP = 135/88. COOX  = 70.1%. Therapy appears optimized to clear Lactic Acid.   eICU Interventions  Continue present management.      Intervention Category Major Interventions: Other:  Lysle Dingwall 11/20/2018, 9:39 PM

## 2018-11-23 NOTE — Anesthesia Postprocedure Evaluation (Signed)
Anesthesia Post Note  Patient: Carmen Stone  Procedure(s) Performed: EXPLORATORY LAPAROTOMY WITH [place] (N/A )     Patient location during evaluation: ICU Anesthesia Type: General Level of consciousness: sedated and patient remains intubated per anesthesia plan Pain management: pain level controlled Vital Signs Assessment: post-procedure vital signs reviewed and stable Respiratory status: patient remains intubated per anesthesia plan and patient on ventilator - see flowsheet for VS Cardiovascular status: unstable Anesthetic complications: no    Last Vitals:  Vitals:   12/03/2018 1615 11/16/2018 1952  BP: (!) 125/36   Pulse: (!) 105   Resp:    Temp:  36.6 C  SpO2: 95%     Last Pain:  Vitals:   12/12/2018 1952  TempSrc: Axillary  PainSc:                  Carmen Stone

## 2018-11-23 NOTE — Anesthesia Procedure Notes (Addendum)
Arterial Line Insertion Start/End06/23/2020 4:40 PM, 12/11/2018 4:51 PM Performed by: Nolon Nations, MD, anesthesiologist  Patient location: OR. Preanesthetic checklist: patient identified, IV checked, risks and benefits discussed, monitors and equipment checked and pre-op evaluation Left, radial was placed Catheter size: 20 G Hand hygiene performed  and maximum sterile barriers used   Attempts: 1 Procedure performed using ultrasound guided technique. Ultrasound Notes:anatomy identified, needle tip was noted to be adjacent to the nerve/plexus identified, no ultrasound evidence of intravascular and/or intraneural injection and image(s) printed for medical record Following insertion, Biopatch and dressing applied. Post procedure assessment: normal  Patient tolerated the procedure well with no immediate complications.

## 2018-11-23 NOTE — Op Note (Signed)
12/12/2018  5:08 PM  PATIENT:  Carmen Stone  63 y.o. female  PRE-OPERATIVE DIAGNOSIS:  PROFERATED BOWEL S/P RESECTION  POST-OPERATIVE DIAGNOSIS:  PROFERATED BOWEL S/P RESECTION  PROCEDURE:  Procedure(s): Reexploration of recent laparotomy WITH [place] OUT (N/A)  SURGEON:  Surgeon(s) and Role:    * Ralene Ok, MD - Primary  PHYSICIAN ASSISTANT: Modena Jansky, PA-C  ANESTHESIA:   general  EBL:  minimal   BLOOD ADMINISTERED:10pack PLTS  DRAINS: Open Abthera Vac  LOCAL MEDICATIONS USED:  NONE  SPECIMEN:  No Specimen  DISPOSITION OF SPECIMEN:  N/A  COUNTS:  YES  TOURNIQUET:  * No tourniquets in log *  DICTATION: .Dragon Dictation Patient is a 63 female who recently underwent exploratory laparotomy secondary to perforated bowel.  Patient continued to be septic in ICU with multiple pressor requirements.  Patient was taken back for reexploration to evaluate for possible further ischemic bowel.  Details of procedure: After the patient was consented she was taken back to the OR and placed in the supine position with bilateral SCDs in place.  Patient was intubated from the ICU.  Patient was then prepped and draped in standard fashion.  A timeout was called and all facts verified.  The old VAC was changed.  There was a large amount of serosanguineous ascites within the abdominal cavity.  The small bowel with eviscerated.  I was able to run the small bowel from the ligament of Treitz distally.  The area up to the previous transection site appeared viable, and malperfused as expected on pressors.  Approximately 10 cm from the transection site there appeared to be an area of ecchymosis to the bowel.  There is difficult to assess whether or not this was ecchymosis for ischemia.  The area appeared purple more consistent with ecchymosis.  I decided not to resect this portion.  This area may declare itself in the future require resection.  At this time the abdominal cavity was  irrigated out with multiple liters of sterile saline.  The area of the previous transection site limits working brought up to the superficial area of the abdominal cavity.  The area of concern was placed in the right side of this area.  The AB Thera VAC was placed intra-abdominally.  This was sealed with sticky drapes.  This was hooked up to suction and had a good seal.  Patient tolerated the procedure well was taken to the ICU, intubated, in critical condition.   PLAN OF CARE: Admit to inpatient   PATIENT DISPOSITION:  ICU - intubated and critically ill.   Delay start of Pharmacological VTE agent (>24hrs) due to surgical blood loss or risk of bleeding: not applicable

## 2018-11-23 NOTE — Progress Notes (Signed)
Temperature taken twice, reading 95.8 axillary. Ice was placed on patient earlier for fever. Ice removed, MD notified, warming blanket placed on patient.

## 2018-11-23 NOTE — Progress Notes (Signed)
CRITICAL VALUE ALERT  Critical Value: calcium 5.8  Date & Time Notied:  4:33 PM 12/08/2018  Provider Notified: Dr. Halford Chessman  Orders Received/Actions taken: Follow-up labs after exploratory laparotomy and initiation of CRRT. Notified OR front desk, who will report to anesthesia.

## 2018-11-24 ENCOUNTER — Inpatient Hospital Stay (HOSPITAL_COMMUNITY): Payer: BLUE CROSS/BLUE SHIELD

## 2018-11-24 ENCOUNTER — Encounter (HOSPITAL_COMMUNITY): Payer: Self-pay | Admitting: General Surgery

## 2018-11-24 DIAGNOSIS — I429 Cardiomyopathy, unspecified: Secondary | ICD-10-CM

## 2018-11-24 LAB — BLOOD GAS, ARTERIAL
Acid-base deficit: 11.2 mmol/L — ABNORMAL HIGH (ref 0.0–2.0)
Acid-base deficit: 10.2 mmol/L — ABNORMAL HIGH (ref 0.0–2.0)
Bicarbonate: 12.8 mmol/L — ABNORMAL LOW (ref 20.0–28.0)
Bicarbonate: 14.1 mmol/L — ABNORMAL LOW (ref 20.0–28.0)
Drawn by: 225631
Drawn by: 257701
FIO2: 30
FIO2: 30
MECHVT: 0.43 mL
MECHVT: 430 mL
O2 Saturation: 97.6 %
O2 Saturation: 96.7 %
PEEP: 5 cmH2O
PEEP: 5 cmH2O
Patient temperature: 95.5
Patient temperature: 98.6
RATE: 32 resp/min
RATE: 32 resp/min
pCO2 arterial: 22.7 mmHg — ABNORMAL LOW (ref 32.0–48.0)
pCO2 arterial: 28 mmHg — ABNORMAL LOW (ref 32.0–48.0)
pH, Arterial: 7.36 (ref 7.350–7.450)
pH, Arterial: 7.324 — ABNORMAL LOW (ref 7.350–7.450)
pO2, Arterial: 110 mmHg — ABNORMAL HIGH (ref 83.0–108.0)
pO2, Arterial: 112 mmHg — ABNORMAL HIGH (ref 83.0–108.0)

## 2018-11-24 LAB — RENAL FUNCTION PANEL
Albumin: 1.8 g/dL — ABNORMAL LOW (ref 3.5–5.0)
Albumin: 1.8 g/dL — ABNORMAL LOW (ref 3.5–5.0)
Anion gap: 21 — ABNORMAL HIGH (ref 5–15)
Anion gap: 21 — ABNORMAL HIGH (ref 5–15)
BUN: 31 mg/dL — ABNORMAL HIGH (ref 8–23)
BUN: 28 mg/dL — ABNORMAL HIGH (ref 8–23)
CO2: 16 mmol/L — ABNORMAL LOW (ref 22–32)
CO2: 16 mmol/L — ABNORMAL LOW (ref 22–32)
Calcium: 6.4 mg/dL — CL (ref 8.9–10.3)
Calcium: 6.5 mg/dL — ABNORMAL LOW (ref 8.9–10.3)
Chloride: 107 mmol/L (ref 98–111)
Chloride: 105 mmol/L (ref 98–111)
Creatinine, Ser: 2.65 mg/dL — ABNORMAL HIGH (ref 0.44–1.00)
Creatinine, Ser: 2.33 mg/dL — ABNORMAL HIGH (ref 0.44–1.00)
GFR calc Af Amer: 21 mL/min — ABNORMAL LOW (ref >60)
GFR calc Af Amer: 25 mL/min — ABNORMAL LOW (ref >60)
GFR calc non Af Amer: 18 mL/min — ABNORMAL LOW (ref >60)
GFR calc non Af Amer: 21 mL/min — ABNORMAL LOW (ref >60)
Glucose, Bld: 91 mg/dL (ref 70–99)
Glucose, Bld: 115 mg/dL — ABNORMAL HIGH (ref 70–99)
Phosphorus: 5.1 mg/dL — ABNORMAL HIGH (ref 2.5–4.6)
Phosphorus: 4.5 mg/dL (ref 2.5–4.6)
Potassium: 4.4 mmol/L (ref 3.5–5.1)
Potassium: 4.4 mmol/L (ref 3.5–5.1)
Sodium: 144 mmol/L (ref 135–145)
Sodium: 142 mmol/L (ref 135–145)

## 2018-11-24 LAB — GLUCOSE, CAPILLARY
Glucose-Capillary: 93 mg/dL (ref 70–99)
Glucose-Capillary: 107 mg/dL — ABNORMAL HIGH (ref 70–99)
Glucose-Capillary: 58 mg/dL — ABNORMAL LOW (ref 70–99)
Glucose-Capillary: 62 mg/dL — ABNORMAL LOW (ref 70–99)
Glucose-Capillary: 74 mg/dL (ref 70–99)
Glucose-Capillary: 107 mg/dL — ABNORMAL HIGH (ref 70–99)
Glucose-Capillary: 88 mg/dL (ref 70–99)
Glucose-Capillary: 168 mg/dL — ABNORMAL HIGH (ref 70–99)

## 2018-11-24 LAB — BPAM PLATELET PHERESIS
Blood Product Expiration Date: 202006102359
ISSUE DATE / TIME: 202006091545
Unit Type and Rh: 5100

## 2018-11-24 LAB — CBC
HCT: 39.2 % (ref 36.0–46.0)
HCT: 34 % — ABNORMAL LOW (ref 36.0–46.0)
Hemoglobin: 12.5 g/dL (ref 12.0–15.0)
Hemoglobin: 11.5 g/dL — ABNORMAL LOW (ref 12.0–15.0)
MCH: 29.5 pg (ref 26.0–34.0)
MCH: 30.6 pg (ref 26.0–34.0)
MCHC: 31.9 g/dL (ref 30.0–36.0)
MCHC: 33.8 g/dL (ref 30.0–36.0)
MCV: 92.5 fL (ref 80.0–100.0)
MCV: 90.4 fL (ref 80.0–100.0)
Platelets: 43 10*3/uL — ABNORMAL LOW (ref 150–400)
Platelets: 28 10*3/uL — CL (ref 150–400)
RBC: 4.24 MIL/uL (ref 3.87–5.11)
RBC: 3.76 MIL/uL — ABNORMAL LOW (ref 3.87–5.11)
RDW: 15.4 % (ref 11.5–15.5)
RDW: 15.4 % (ref 11.5–15.5)
WBC: 10.2 10*3/uL (ref 4.0–10.5)
WBC: 13.7 10*3/uL — ABNORMAL HIGH (ref 4.0–10.5)
nRBC: 5.4 % — ABNORMAL HIGH (ref 0.0–0.2)
nRBC: 9.5 % — ABNORMAL HIGH (ref 0.0–0.2)

## 2018-11-24 LAB — PATHOLOGIST SMEAR REVIEW

## 2018-11-24 LAB — PREPARE PLATELET PHERESIS: Unit division: 0

## 2018-11-24 LAB — MAGNESIUM: Magnesium: 2 mg/dL (ref 1.7–2.4)

## 2018-11-24 MED ORDER — DEXTROSE 10 % IV SOLN
INTRAVENOUS | Status: DC
  Administered 2018-11-24: 13:00:00 via INTRAVENOUS

## 2018-11-24 MED ORDER — DEXTROSE 50 % IV SOLN
INTRAVENOUS | Status: AC
  Administered 2018-11-24: 25 mL
  Filled 2018-11-24: qty 50

## 2018-11-24 MED ORDER — CALCIUM GLUCONATE-NACL 1-0.675 GM/50ML-% IV SOLN
1.0000 g | Freq: Once | INTRAVENOUS | Status: AC
  Administered 2018-11-24: 1000 mg via INTRAVENOUS
  Filled 2018-11-24: qty 50

## 2018-11-24 MED ORDER — SODIUM BICARBONATE 8.4 % IV SOLN
INTRAVENOUS | Status: DC
  Administered 2018-11-24 – 2018-11-25 (×4): via INTRAVENOUS
  Filled 2018-11-24 (×7): qty 150

## 2018-11-24 MED ORDER — CHLORHEXIDINE GLUCONATE CLOTH 2 % EX PADS
6.0000 | MEDICATED_PAD | Freq: Once | CUTANEOUS | Status: AC
  Administered 2018-11-25: 6 via TOPICAL

## 2018-11-24 MED ORDER — CHLORHEXIDINE GLUCONATE CLOTH 2 % EX PADS
6.0000 | MEDICATED_PAD | Freq: Once | CUTANEOUS | Status: AC
  Administered 2018-11-24: 6 via TOPICAL

## 2018-11-24 NOTE — Progress Notes (Signed)
1 RT and 1 RN present for arterial line change. PT skin underneath original dressing was not intact and some pink skin exposed. RN aware and treated and dressed appropriately. New line draws and flushes well with good blood pressure tracing.

## 2018-11-24 NOTE — Progress Notes (Signed)
Notified MD Sood of Ca 6.5. Will continue to monitor and  assess patient.

## 2018-11-24 NOTE — Progress Notes (Signed)
1 Day Post-Op  Subjective: POD #1/ #3  Remains on 3 pressors.  Remains on dialysis.  35 cc urine output in 24 hours.   Sedated on vent. BP 125/76.  Heart rate 91.  Lactic acid 9.5, elevated--- this is not necessarily explained by operative findings yesterday.  There was some discoloration of the bowel but no infarction ABG 7.36, PCO2 22, PO2 110.  Creatinine 2.65.  BUN 31.  Potassium 4.4.  Glucose 91 Hemoglobin 12.5  Objective: Vital signs in last 24 hours: Temp:  [94.2 F (34.6 C)-100.6 F (38.1 C)] 97.5 F (36.4 C) (06/10 0700) Pulse Rate:  [70-121] 91 (06/10 0700) BP: (103-143)/(22-87) 125/36 (06/09 1615) SpO2:  [90 %-100 %] 100 % (06/10 0754) Arterial Line BP: (99-150)/(48-69) 117/52 (06/10 0700) FiO2 (%):  [30 %] 30 % (06/10 0754) Weight:  [92.3 kg-93.2 kg] 92.3 kg (06/10 0305) Last BM Date: (PTA)  Intake/Output from previous day: 06/09 0701 - 06/10 0700 In: 7062 [I.V.:5318.1; Blood:278; IV Piggyback:543.9] Out: 3762 [Urine:35; Emesis/NG output:50; Drains:975; Blood:20] Intake/Output this shift: Total I/O In: 492.4 [I.V.:417.7; Other:25; IV Piggyback:49.8] Out: 235 [Other:235]   Exam: General appearance: sedated Resp: coarse BS bila Cardio: regular rate and rhythm, S1, S2 normal, no murmur, click, rub or gallop GI: abd vac in place.  Drain is fairly clear.  Abdomen soft   Lab Results:  Results for orders placed or performed during the hospital encounter of 12/03/2018 (from the past 24 hour(s))  Glucose, capillary     Status: Abnormal   Collection Time: 12/05/2018 11:57 AM  Result Value Ref Range   Glucose-Capillary 65 (L) 70 - 99 mg/dL  Glucose, capillary     Status: Abnormal   Collection Time: 11/18/2018 12:17 PM  Result Value Ref Range   Glucose-Capillary 126 (H) 70 - 99 mg/dL  Glucose, capillary     Status: Abnormal   Collection Time: 11/19/2018  3:23 PM  Result Value Ref Range   Glucose-Capillary 68 (L) 70 - 99 mg/dL  Renal function panel (daily at 1600)      Status: Abnormal   Collection Time: 12/04/2018  3:31 PM  Result Value Ref Range   Sodium 145 135 - 145 mmol/L   Potassium 4.5 3.5 - 5.1 mmol/L   Chloride 113 (H) 98 - 111 mmol/L   CO2 12 (L) 22 - 32 mmol/L   Glucose, Bld 89 70 - 99 mg/dL   BUN 41 (H) 8 - 23 mg/dL   Creatinine, Ser 3.79 (H) 0.44 - 1.00 mg/dL   Calcium 5.8 (LL) 8.9 - 10.3 mg/dL   Phosphorus 6.9 (H) 2.5 - 4.6 mg/dL   Albumin 1.8 (L) 3.5 - 5.0 g/dL   GFR calc non Af Amer 12 (L) >60 mL/min   GFR calc Af Amer 14 (L) >60 mL/min   Anion gap 20 (H) 5 - 15  Prepare Pheresed Platelets     Status: None   Collection Time: 11/30/2018  3:34 PM  Result Value Ref Range   Unit Number [id number]    Blood Component Type PLTP LR1 PAS    Unit division 00    Status of Unit ISSUED,FINAL    Transfusion Status      OK TO TRANSFUSE Performed at [facility], [place] [address], [place], [place] [address]   Glucose, capillary     Status: Abnormal   Collection Time: 11/20/2018  3:42 PM  Result Value Ref Range   Glucose-Capillary 147 (H) 70 - 99 mg/dL  I-STAT 7, (LYTES, BLD  GAS, ICA, H+H)     Status: Abnormal   Collection Time: 11/20/2018  4:58 PM  Result Value Ref Range   pH, Arterial 7.102 (LL) 7.350 - 7.450   pCO2 arterial 44.3 32.0 - 48.0 mmHg   pO2, Arterial 311.0 (H) 83.0 - 108.0 mmHg   Bicarbonate 14.1 (L) 20.0 - 28.0 mmol/L   TCO2 16 (L) 22 - 32 mmol/L   O2 Saturation 100.0 %   Acid-base deficit 15.0 (H) 0.0 - 2.0 mmol/L   Sodium 145 135 - 145 mmol/L   Potassium 4.2 3.5 - 5.1 mmol/L   Calcium, Ion 1.03 (L) 1.15 - 1.40 mmol/L   HCT 32.0 (L) 36.0 - 46.0 %   Hemoglobin 10.9 (L) 12.0 - 15.0 g/dL   Patient temperature 35.3 C    Sample type ARTERIAL    Comment NOTIFIED PHYSICIAN   Prepare Pheresed Platelets     Status: None (Preliminary result)   Collection Time: 12/12/2018  5:50 PM  Result Value Ref Range   Unit Number [id number]    Blood Component Type PLTP LR1 PAS    Unit division 00    Status of Unit  ALLOCATED    Transfusion Status OK TO TRANSFUSE   Glucose, capillary     Status: None   Collection Time: 12/06/2018  7:28 PM  Result Value Ref Range   Glucose-Capillary 77 70 - 99 mg/dL  Glucose, capillary     Status: Abnormal   Collection Time: 11/20/2018  7:29 PM  Result Value Ref Range   Glucose-Capillary 100 (H) 70 - 99 mg/dL  CBC     Status: Abnormal   Collection Time: 12/14/2018  7:45 PM  Result Value Ref Range   WBC 10.1 4.0 - 10.5 K/uL   RBC 4.06 3.87 - 5.11 MIL/uL   Hemoglobin 12.3 12.0 - 15.0 g/dL   HCT 38.0 36.0 - 46.0 %   MCV 93.6 80.0 - 100.0 fL   MCH 30.3 26.0 - 34.0 pg   MCHC 32.4 30.0 - 36.0 g/dL   RDW 15.3 11.5 - 15.5 %   Platelets 58 (L) 150 - 400 K/uL   nRBC 4.3 (H) 0.0 - 0.2 %  Basic metabolic panel     Status: Abnormal   Collection Time: 12/02/2018  7:45 PM  Result Value Ref Range   Sodium 144 135 - 145 mmol/L   Potassium 4.3 3.5 - 5.1 mmol/L   Chloride 111 98 - 111 mmol/L   CO2 15 (L) 22 - 32 mmol/L   Glucose, Bld 105 (H) 70 - 99 mg/dL   BUN 38 (H) 8 - 23 mg/dL   Creatinine, Ser 3.29 (H) 0.44 - 1.00 mg/dL   Calcium 6.2 (LL) 8.9 - 10.3 mg/dL   GFR calc non Af Amer 14 (L) >60 mL/min   GFR calc Af Amer 16 (L) >60 mL/min   Anion gap 18 (H) 5 - 15  Lactic acid, plasma     Status: Abnormal   Collection Time: 12/06/2018  7:45 PM  Result Value Ref Range   Lactic Acid, Venous 9.5 (HH) 0.5 - 1.9 mmol/L  .Cooxemetry Panel (carboxy, met, total hgb, O2 sat)     Status: Abnormal   Collection Time: 12/01/2018  9:15 PM  Result Value Ref Range   Total hemoglobin 11.7 (L) 12.0 - 16.0 g/dL   O2 Saturation 70.1 %   Carboxyhemoglobin 0.7 0.5 - 1.5 %   Methemoglobin 0.8 0.0 - 1.5 %  Blood gas, arterial     Status:  Abnormal   Collection Time: 11/15/2018 10:55 PM  Result Value Ref Range   FIO2 30.00    Delivery systems VENTILATOR    Mode PRESSURE REGULATED VOLUME CONTROL    VT 0.430 mL   LHR 32 resp/min   Peep/cpap 5.0 cm H20   pH, Arterial 7.350 7.350 - 7.450   pCO2  arterial 24.7 (L) 32.0 - 48.0 mmHg   pO2, Arterial 118 (H) 83.0 - 108.0 mmHg   Bicarbonate 13.3 (L) 20.0 - 28.0 mmol/L   Acid-base deficit 10.6 (H) 0.0 - 2.0 mmol/L   O2 Saturation 97.8 %   Patient temperature 97.9    Collection site A-LINE    Drawn by [id number]    Sample type ARTERIAL DRAW   Glucose, capillary     Status: None   Collection Time: 12/09/2018 10:58 PM  Result Value Ref Range   Glucose-Capillary 71 70 - 99 mg/dL  Glucose, capillary     Status: None   Collection Time: 11/24/18  3:00 AM  Result Value Ref Range   Glucose-Capillary 93 70 - 99 mg/dL  CBC     Status: Abnormal   Collection Time: 11/24/18  3:13 AM  Result Value Ref Range   WBC 10.2 4.0 - 10.5 K/uL   RBC 4.24 3.87 - 5.11 MIL/uL   Hemoglobin 12.5 12.0 - 15.0 g/dL   HCT 39.2 36.0 - 46.0 %   MCV 92.5 80.0 - 100.0 fL   MCH 29.5 26.0 - 34.0 pg   MCHC 31.9 30.0 - 36.0 g/dL   RDW 15.4 11.5 - 15.5 %   Platelets 43 (L) 150 - 400 K/uL   nRBC 5.4 (H) 0.0 - 0.2 %  Renal function panel     Status: Abnormal   Collection Time: 11/24/18  3:13 AM  Result Value Ref Range   Sodium 144 135 - 145 mmol/L   Potassium 4.4 3.5 - 5.1 mmol/L   Chloride 107 98 - 111 mmol/L   CO2 16 (L) 22 - 32 mmol/L   Glucose, Bld 91 70 - 99 mg/dL   BUN 31 (H) 8 - 23 mg/dL   Creatinine, Ser 2.65 (H) 0.44 - 1.00 mg/dL   Calcium 6.4 (LL) 8.9 - 10.3 mg/dL   Phosphorus 5.1 (H) 2.5 - 4.6 mg/dL   Albumin 1.8 (L) 3.5 - 5.0 g/dL   GFR calc non Af Amer 18 (L) >60 mL/min   GFR calc Af Amer 21 (L) >60 mL/min   Anion gap 21 (H) 5 - 15  Magnesium     Status: None   Collection Time: 11/24/18  3:13 AM  Result Value Ref Range   Magnesium 2.0 1.7 - 2.4 mg/dL  Blood gas, arterial     Status: Abnormal   Collection Time: 11/24/18  5:45 AM  Result Value Ref Range   FIO2 30.00    Delivery systems VENTILATOR    Mode PRESSURE REGULATED VOLUME CONTROL    VT 0.430 mL   LHR 32 resp/min   Peep/cpap 5.0 cm H20   pH, Arterial 7.360 7.350 - 7.450   pCO2 arterial  22.7 (L) 32.0 - 48.0 mmHg   pO2, Arterial 110 (H) 83.0 - 108.0 mmHg   Bicarbonate 12.8 (L) 20.0 - 28.0 mmol/L   Acid-base deficit 11.2 (H) 0.0 - 2.0 mmol/L   O2 Saturation 97.6 %   Patient temperature 95.5    Collection site A-LINE    Drawn by [id number]    Sample type ARTERIAL DRAW   Glucose, capillary  Status: Abnormal   Collection Time: 11/24/18  7:49 AM  Result Value Ref Range   Glucose-Capillary 58 (L) 70 - 99 mg/dL   Comment 1 Document in Chart   Glucose, capillary     Status: Abnormal   Collection Time: 11/24/18  7:57 AM  Result Value Ref Range   Glucose-Capillary 62 (L) 70 - 99 mg/dL     Studies/Results: Dg Chest Port 1 View  Result Date: 11/24/2018 CLINICAL DATA:  Respiratory failure EXAM: PORTABLE CHEST 1 VIEW COMPARISON:  None. FINDINGS: Cardiac shadow is stable. Endotracheal tube, gastric catheter, right jugular and subclavian central lines are again seen and stable. Left chest wall port is noted. Pleural effusions are noted bilaterally stable from the prior exam. Bibasilar atelectasis is noted. No pneumothorax is seen. IMPRESSION: Stable appearance of the chest from the previous day. Electronically Signed   By: Inez Catalina M.D.   On: 11/24/2018 08:14   Dg Chest Port 1 View  Result Date: 12/12/2018 CLINICAL DATA:  Check central line placement EXAM: PORTABLE CHEST 1 VIEW COMPARISON:  12/12/2018 FINDINGS: Endotracheal tube, gastric catheter and left chest wall port are again seen and stable. Right subclavian central venous line is noted in distal superior vena cava. Right jugular temporary dialysis catheter is now seen. No pneumothorax is noted. Bilateral pleural effusions and basilar atelectasis are seen. No other focal abnormality is noted. IMPRESSION: No pneumothorax following central line placement. Bilateral effusions and bibasilar atelectasis. Electronically Signed   By: Inez Catalina M.D.   On: 11/26/2018 15:24    . chlorhexidine gluconate (MEDLINE KIT)  15 mL Mouth  Rinse BID  . Chlorhexidine Gluconate Cloth  6 each Topical Daily  . dorzolamide-timolol  1 drop Both Eyes BID  . hydrocortisone sod succinate (SOLU-CORTEF) inj  50 mg Intravenous Q6H  . insulin aspart  3-9 Units Subcutaneous Q4H  . mouth rinse  15 mL Mouth Rinse 10 times per day  . 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 Ninfa Linden - POD 3  Status post reopening of open abdomen with washout. 12/04/2018, Dr. Rosendo Gros. -POD #1 -Bowel was a little bit discolored but not infarcted -Operative decision was to do no anastomosis and no further resection -Plan reexploration 11/16/2018 for washout and assessment.  She may require resection and proximal diversion.  Fascial closure if possible  Thrombocytopenia.  Platelet count 43,000 today.  - Appreciate CCM assistance. Plan return to OR 12/14/2018 to eval for visceral ischemia. -Continue full vent support.  -Continue broad spectrum antibiotics. -Prognosis guarded  I updated her brother Iona Beard on the phone this morning.  He is aware of her condition and the plans to take her back to the OR tomorrow.  ID -zosyn 6/7>>, diflucan 6/7>>, vancomycin 6/8>>6/8 FEN -IVF, NPO/NGT VTE -SCDs, lovenox on hold for thrombocytopenia Foley -in place Contact - brother Iona Beard [phone] or [phone])     LOS: 3 days    Adin Hector 11/24/2018  . .prob

## 2018-11-24 NOTE — Progress Notes (Signed)
CRITICAL VALUE ALERT  Critical Value: 62   Date & Time Notified: 0900 and 6/10   Provider Notified: MD Sood   Orders Received/Actions taken: Followed Hypoglycemia protocol, replaced with d50 (1/2 amp per protocol), CBG 0826 107.

## 2018-11-24 NOTE — Progress Notes (Signed)
[facility] Progress Note  Subjective: on vent , 3 pressors, I/O + 1.8 L  Vitals:   11/24/18 1032 11/24/18 1100 11/24/18 1200 11/24/18 1208  BP: (!) 136/55     Pulse:      Resp:      Temp: 98.1 F (36.7 C) 97.9 F (36.6 C) (!) 97.5 F (36.4 C)   TempSrc:   Core (Comment)   SpO2:   100% 98%  Weight:      Height:        Inpatient medications: . chlorhexidine gluconate (MEDLINE KIT)  15 mL Mouth Rinse BID  . Chlorhexidine Gluconate Cloth  6 each Topical Daily  . dorzolamide-timolol  1 drop Both Eyes BID  . hydrocortisone sod succinate (SOLU-CORTEF) inj  50 mg Intravenous Q6H  . insulin aspart  3-9 Units Subcutaneous Q4H  . mouth rinse  15 mL Mouth Rinse 10 times per day  . pantoprazole (PROTONIX) IV  40 mg Intravenous Q24H   .  prismasol BGK 4/2.5 Stopped (12/07/2018 1020)  .  prismasol BGK 4/2.5 Stopped (11/26/2018 1019)  . sodium chloride 250 mL (11/24/18 1200)  . dextrose 40 mL/hr at 11/24/18 1233  . fentaNYL infusion INTRAVENOUS 100 mcg/hr (11/24/18 1100)  . fluconazole (DIFLUCAN) IV Stopped (12/12/2018 2030)  . norepinephrine (LEVOPHED) Adult infusion 37 mcg/min (11/24/18 1100)  . phenylephrine (NEO-SYNEPHRINE) Adult infusion 160 mcg/min (11/24/18 1100)  . piperacillin-tazobactam (ZOSYN)  IV Stopped (11/24/18 0837)  . prismasol BGK 4/2.5 1,750 mL/hr at 11/24/18 1146  . propofol (DIPRIVAN) infusion Stopped (11/24/2018 2220)  .  sodium bicarbonate  infusion 1000 mL 75 mL/hr at 11/24/18 1100  . vasopressin (PITRESSIN) infusion - *FOR SHOCK* 0.03 Units/min (11/24/18 1210)   sodium chloride, acetaminophen, albuterol, alteplase, [DISCONTINUED] diphenhydrAMINE **OR** diphenhydrAMINE, fentaNYL, heparin, sodium chloride, sodium chloride flush    Exam: Gen on vent, sedated, ETT  No jvd or bruits Chest clear bilat ant and lat RRR no MRG Abd soft ntnd no mass or ascites +bs, large midline wound vac  GU foley cath minimal urine Ext diffuse LE/ UE 2+ edema Neuro is  sedated on the vent   Home meds:  - insulin glargine 10u/ metformin 500 bid/ novolog 70/30 20u bid ac  - atorvastatin 10/ levothyroxine 25 ug/ pravastatin 10   UA neg on 6/7  UNa 35,  UCr 34  CXR 6/9 > bilat effusions and bilat atx  Assessment: 1. Acute renal failure - in setting of septic shock due to perf bowel, suspect ATN. CRRT started yest, d#2 today. Remains on pressors. Not making urine. Cont CRRT, keeping even.  2. Perforated small bowel / septic shock- per CCM/ gen surg 3. Thrombocytopenia - significant, prob d/t sepsis 4. DM on insulin 5. Vol overload - w/ bilat effusions on CXR and sig edema on exam, up [place] 11/24/2018, 12:55 PM  Iron/TIBC/Ferritin/ %Sat No results found for: IRON, TIBC, FERRITIN, IRONPCTSAT Recent Labs  Lab 11/26/2018 2336  11/24/18 0313  NA  --    < > 144  K  --    < > 4.4  CL  --    < > 107  CO2  --    < > 16*  GLUCOSE  --    < > 91  BUN  --    < > 31*  CREATININE  --    < > 2.65*  CALCIUM  --    < > 6.4*  PHOS  --    < >  5.1*  ALBUMIN  --    < > 1.8*  INR 1.4*  --   --    < > = values in this interval not displayed.   Recent Labs  Lab 12/09/2018 0355  AST 17  ALT 18  ALKPHOS 115  BILITOT 0.5  PROT 7.9   Recent Labs  Lab 11/24/18 0313  WBC 10.2  HGB 12.5  HCT 39.2  PLT 43*

## 2018-11-24 NOTE — TOC Initial Note (Signed)
Transition of Care [facility]) - Initial/Assessment Note    Patient Details  Name: Carmen Stone MRN: [id number] Date of Birth: 05/25/1956  [facility] of [facility]) CM/SW Contact:    Lynnell Catalan, RN Phone Number: 11/24/2018, 10:37 AM                        Readmission Risk Interventions Readmission Risk Prevention Plan 11/17/2018  Transportation Screening Complete  PCP or Specialist Appt within 3-5 Days Not Complete  Not Complete comments not ready for d/c  HRI or [place] Complete  Social Work Consult for [place] Planning/Counseling Complete  Palliative Care Screening Not Applicable  Medication Review [employer] photographer) Complete  Some recent data might be hidden

## 2018-11-24 NOTE — Progress Notes (Signed)
NAME:  Carmen Stone, MRN:  [id number], DOB:  10-01-1955, LOS: 3 ADMISSION DATE:  12/12/2018, CONSULTATION DATE:  11/21/2018 REFERRING MD:  Dr. Ninfa Linden, Surgery, CHIEF COMPLAINT:  Abdominal pain   Brief History   63 yo female presented to ER with nausea, vomiting, abdominal pain.  CT abd/pelvis showed focal perforated loop of SB with free air with multiple complex ventral hernias.  Had emergent laparotomy with SB resection, lysis of adhesions and wound vac.  Remained on vent and pressors post op.  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, HTN, HLD, DM type II  Significant Hospital Events   6/07 Admit, to OR 6/08 transfuse FFP, on multiple pressors 6/09 To OR for wash out, start CRRT  Consults:  Renal 6/09 AKI, acidosis Cardiology 6/09 Takotsubo CM  Procedures:  ETT 6/07 >> Rt IJ HD 6/09 >> Rt [place] CVL 6/09 >> Lt radial aline 6/09 >>  Significant Diagnostic Tests:  CT abd/pelvis 6/07 >> focal perforated loop of SB with free air with multiple complex ventral hernias Echo 6/08 >> EF 20%, Takotsubo CM  Micro Data:  COVID 6/07 >> negative  Antimicrobials:  Zosyn 6/07 >> Diflucan 6/07 >>  Interim history/subjective:  Remains on pressors, CRRT, full vent support.  Objective   Blood pressure (!) 125/36, pulse 91, temperature 98.6 F (37 C), resp. rate (!) 24, height 5\' 4"  (1.626 m), weight 92.3 kg, SpO2 100 %. CVP:  [9 mmHg-24 mmHg] 9 mmHg  Vent Mode: PRVC FiO2 (%):  [30 %] 30 % Set Rate:  [32 bmp] 32 bmp Vt Set:  [430 mL] 430 mL PEEP:  [5 cmH20] 5 cmH20 Plateau Pressure:  [21 cmH20-24 cmH20] 21 cmH20   Intake/Output Summary (Last 24 hours) at 11/24/2018 0918 Last data filed at 11/24/2018 0900 Gross per 24 hour  Intake 5833.69 ml  Output 4799 ml  Net 1034.69 ml   Filed Weights   11/26/2018 0309 11/15/2018 1700 11/24/18 0305  Weight: 74.8 kg 93.2 kg 92.3 kg    Examination:  General - sedated Eyes - pupils pinpoint and reactive  ENT - ETT in place Cardiac - regular rhythm, no murmur Chest - scattered rhonchi Abdomen - wound vac in place Extremities - 1+ edema Skin - no rashes Neuro - RASS -3  CXR (reviewed by me) - basilar ATX   Resolved Hospital Problem list     Assessment & Plan:   Acute hypoxic respiratory failure in setting of peritonitis. Plan - continue full vent support - f/u CXR, ABG  Septic shock from peritonitis. 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 Hx of HTN, HLD. Plan - cardiology following - hold outpt lisinopril, pravachol  AKI from ATN >> Baseline creatinine 0.73 from 11/21/18. Metabolic acidosis. Hypocalcemia, hypomagnesemia. Plan - continue CRRT - f/u BMET, ABG - continue HCO3 in IV fluid for now  S/p SB resection, lysis of adhesions, wound vac. Plan - post op care per surgery - tentative plan to return to OR 6/11  DM type II. Episodes of hypoglycemia noted 6/10. Plan - continue dextrose in IV fluid - SSI - hold outpt metformin  Acute metabolic encephalopathy. Plan - RASS goal -1 to -2  Thrombocytopenia from sepsis. Plan - hold lovenox - f/u CBC - defer to surgery and/or anesthesia to arrange for PLT transfusion if needed prior to  return trips to OR since transfusion needs to be timed appropriately with when she would go back to OR, and this is determined by surgery and anesthesia - otherwise, no indication for PLT transfusion medically unless PLT count was < 10K or she was actively bleeding  Hypothermia likely related to CRRT. Plan - warming blanket while on CRRT  Best practice:  Diet: NPO DVT prophylaxis: SCDs GI prophylaxis: Protonix Mobility: Bed rest Code Status: Full code Disposition: ICU  Labs    CMP Latest Ref Rng & Units 11/24/2018 11/16/2018 11/26/2018  Glucose 70 - 99 mg/dL 91 105(H) -  BUN 8 - 23 mg/dL 31(H) 38(H)  -  Creatinine 0.44 - 1.00 mg/dL 2.65(H) 3.29(H) -  Sodium 135 - 145 mmol/L 144 144 145  Potassium 3.5 - 5.1 mmol/L 4.4 4.3 4.2  Chloride 98 - 111 mmol/L 107 111 -  CO2 22 - 32 mmol/L 16(L) 15(L) -  Calcium 8.9 - 10.3 mg/dL 6.4(LL) 6.2(LL) -  Total Protein 6.5 - 8.1 g/dL - - -  Total Bilirubin 0.3 - 1.2 mg/dL - - -  Alkaline Phos 38 - 126 U/L - - -  AST 15 - 41 U/L - - -  ALT 0 - 44 U/L - - -   CBC Latest Ref Rng & Units 11/24/2018 12/14/2018 12/05/2018  WBC 4.0 - 10.5 K/uL 10.2 10.1 -  Hemoglobin 12.0 - 15.0 g/dL 12.5 12.3 10.9(L)  Hematocrit 36.0 - 46.0 % 39.2 38.0 32.0(L)  Platelets 150 - 400 K/uL 43(L) 58(L) -   ABG    Component Value Date/Time   PHART 7.360 11/24/2018 0545   PCO2ART 22.7 (L) 11/24/2018 0545   PO2ART 110 (H) 11/24/2018 0545   HCO3 12.8 (L) 11/24/2018 0545   TCO2 16 (L) 12/09/2018 1658   ACIDBASEDEF 11.2 (H) 11/24/2018 0545   O2SAT 97.6 11/24/2018 0545   CBG (last 3)  Recent Labs    11/24/18 0300 11/24/18 0749 11/24/18 0757  GLUCAP 93 58* 62*   CC time 32 minutes  Chesley Mires, MD [place] 11/24/2018, 9:18 AM

## 2018-11-24 NOTE — Progress Notes (Signed)
Progress Note  Patient Name: Carmen Stone Date of Encounter: 11/24/2018  Primary Cardiologist: Buford Dresser, MD   Subjective   Remains intubated.  Inpatient Medications    Scheduled Meds:  chlorhexidine gluconate (MEDLINE KIT)  15 mL Mouth Rinse BID   Chlorhexidine Gluconate Cloth  6 each Topical Daily   dorzolamide-timolol  1 drop Both Eyes BID   hydrocortisone sod succinate (SOLU-CORTEF) inj  50 mg Intravenous Q6H   insulin aspart  3-9 Units Subcutaneous Q4H   mouth rinse  15 mL Mouth Rinse 10 times per day   pantoprazole (PROTONIX) IV  40 mg Intravenous Q24H   Continuous Infusions:   prismasol BGK 4/2.5 Stopped (12/08/2018 1020)    prismasol BGK 4/2.5 Stopped (12/06/2018 1019)   sodium chloride 10 mL/hr at 11/24/18 0701   fentaNYL infusion INTRAVENOUS 100 mcg/hr (11/24/18 1100)   fluconazole (DIFLUCAN) IV Stopped (12/09/2018 2030)   norepinephrine (LEVOPHED) Adult infusion 37 mcg/min (11/24/18 1100)   phenylephrine (NEO-SYNEPHRINE) Adult infusion 160 mcg/min (11/24/18 1100)   piperacillin-tazobactam (ZOSYN)  IV Stopped (11/24/18 0837)   prismasol BGK 4/2.5 1,750 mL/hr at 11/24/18 1146   propofol (DIPRIVAN) infusion Stopped (12/12/2018 2220)    sodium bicarbonate  infusion 1000 mL 75 mL/hr at 11/24/18 1100   vasopressin (PITRESSIN) infusion - *FOR SHOCK* 0.03 Units/min (11/24/18 1210)   PRN Meds: sodium chloride, acetaminophen, albuterol, alteplase, [DISCONTINUED] diphenhydrAMINE **OR** diphenhydrAMINE, fentaNYL, heparin, sodium chloride, sodium chloride flush   Vital Signs    Vitals:   11/24/18 0800 11/24/18 0900 11/24/18 1000 11/24/18 1200  BP:      Pulse:      Resp:      Temp: 98.4 F (36.9 C) 98.6 F (37 C) 98.2 F (36.8 C) (!) 97.5 F (36.4 C)  TempSrc: Core     SpO2:      Weight:      Height:        Intake/Output Summary (Last 24 hours) at 11/24/2018 1210 Last data filed at 11/24/2018 1100 Gross per 24 hour  Intake  5576.25 ml  Output 5284 ml  Net 292.25 ml   Last 3 Weights 11/24/2018 12/09/2018 12/09/2018  Weight (lbs) 203 lb 7.8 oz 205 lb 7.5 oz 165 lb  Weight (kg) 92.3 kg 93.2 kg 74.844 kg      Telemetry    Sinus in the 70s, PACs/PVCs - Personally Reviewed  ECG    No new tracings - Personally Reviewed  Physical Exam   GEN: intubated Neck: exam difficult Cardiac: RRR, no murmurs, rubs, or gallops.  Respiratory: intubated GI: wound vac in place MS: + edema; No deformity. Neuro:  Nonfocal  Psych: Normal affect   Labs    Chemistry Recent Labs  Lab 11/20/2018 0355  12/02/2018 1531 11/26/2018 1658 11/22/2018 1945 11/24/18 0313  NA 136   < > 145 145 144 144  K 3.7   < > 4.5 4.2 4.3 4.4  CL 102   < > 113*  --  111 107  CO2 23   < > 12*  --  15* 16*  GLUCOSE 495*   < > 89  --  105* 91  BUN 18   < > 41*  --  38* 31*  CREATININE 0.73   < > 3.79*  --  3.29* 2.65*  CALCIUM 9.2   < > 5.8*  --  6.2* 6.4*  PROT 7.9  --   --   --   --   --   ALBUMIN 4.2  --  1.8*  --   --  1.8*  AST 17  --   --   --   --   --   ALT 18  --   --   --   --   --   ALKPHOS 115  --   --   --   --   --   BILITOT 0.5  --   --   --   --   --   GFRNONAA >60   < > 12*  --  14* 18*  GFRAA >60   < > 14*  --  16* 21*  ANIONGAP 11   < > 20*  --  18* 21*   < > = values in this interval not displayed.     Hematology Recent Labs  Lab 12/04/2018 0410 11/18/2018 1658 11/15/2018 1945 11/24/18 0313  WBC 13.1*  --  10.1 10.2  RBC 4.26  --  4.06 4.24  HGB 12.6 10.9* 12.3 12.5  HCT 40.0 32.0* 38.0 39.2  MCV 93.9  --  93.6 92.5  MCH 29.6  --  30.3 29.5  MCHC 31.5  --  32.4 31.9  RDW 15.2  --  15.3 15.4  PLT 33*  --  58* 43*    Cardiac EnzymesNo results for input(s): TROPONINI in the last 168 hours. No results for input(s): TROPIPOC in the last 168 hours.   BNPNo results for input(s): BNP, PROBNP in the last 168 hours.   DDimer No results for input(s): DDIMER in the last 168 hours.   Radiology    Dg Chest Port 1  View  Result Date: 11/24/2018 CLINICAL DATA:  Respiratory failure EXAM: PORTABLE CHEST 1 VIEW COMPARISON:  None. FINDINGS: Cardiac shadow is stable. Endotracheal tube, gastric catheter, right jugular and subclavian central lines are again seen and stable. Left chest wall port is noted. Pleural effusions are noted bilaterally stable from the prior exam. Bibasilar atelectasis is noted. No pneumothorax is seen. IMPRESSION: Stable appearance of the chest from the previous day. Electronically Signed   By: Inez Catalina M.D.   On: 11/24/2018 08:14   Dg Chest Port 1 View  Result Date: 11/19/2018 CLINICAL DATA:  Check central line placement EXAM: PORTABLE CHEST 1 VIEW COMPARISON:  11/29/2018 FINDINGS: Endotracheal tube, gastric catheter and left chest wall port are again seen and stable. Right subclavian central venous line is noted in distal superior vena cava. Right jugular temporary dialysis catheter is now seen. No pneumothorax is noted. Bilateral pleural effusions and basilar atelectasis are seen. No other focal abnormality is noted. IMPRESSION: No pneumothorax following central line placement. Bilateral effusions and bibasilar atelectasis. Electronically Signed   By: Inez Catalina M.D.   On: 11/24/2018 15:24   Dg Chest Port 1 View  Result Date: 11/16/2018 CLINICAL DATA:  Postop exploratory laparotomy. Central line and endotracheal tube placement check EXAM: PORTABLE CHEST 1 VIEW COMPARISON:  11/22/2018 FINDINGS: There is a right sided IJ catheter with tip in the projection of the cavoatrial junction. Left chest wall port a catheter tip is at the distal SVC. ETT tip is stable, 2.8 cm above carina. NG tube in place. Normal heart size. There are small bilateral pleural effusions which appear increased from previous exam. Associated diminished aeration to both lung bases. IMPRESSION: 1. Stable position of support apparatus. 2. Increase in bilateral pleural effusions. Electronically Signed   By: Kerby Moors M.D.    On: 12/05/2018 08:19    Cardiac Studies   Echo 11/22/18: 1.  The left ventricle had a visually estimated ejection fraction of of 20%. The cavity size was normal. The mid-apical LV wall segments were severely hypokinetic, the basal segments were relatively preserved. This suggests a large LAD-territory  infarction versus stress (Takotsubo-type) cardiomyopathy. 2. The aortic root is normal in size and structure. 3. The right ventricle has normal systolc function. The cavity was normal. There is no increase in right ventricular wall thickness. 4. Limited echo.  Patient Profile     63 y.o. female with a hx of breast cancer s/p chemoradiation 2015, HTN, HLD, and DM2 who is being seen today for the evaluation of stress cardiomyopathy.  Assessment & Plan    1. New onset systolic heart failure 2. 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 Sepsis secondary to SB perforation and peritonitis 4. Acute kidney injury 5. Small bowel perforation 6. Peritonitis - patient remains intubated  - remains on three pressors - she was also started on CRRT yesterday - not a cath candidate, suspect stress cardiomyopathy but can't definitively diagnose at this time - reopened abdomen for washout 12/08/2018 - plan for re-exploration 12/11/2018        For questions or updates, please contact [place] Please consult [email or web address] for contact info under        Signed, Ledora Bottcher, PA  11/24/2018, 12:10 PM

## 2018-11-24 NOTE — Progress Notes (Signed)
Spoke with Dr. Oletta Darter about need for constant temp monitoring for consistently low temperatures. Received permission to change out regular foley catheter with temp foley.

## 2018-11-24 NOTE — Progress Notes (Signed)
[facility] Progress Note Patient Name: Carmen Stone DOB: 08-30-1955 MRN: [id number]   Date of Service  11/24/2018  HPI/Events of Note  Hypocalcemia - Calcium = 6.4.   eICU Interventions  Will replace Ca++.      Intervention Category Major Interventions: Electrolyte abnormality - evaluation and management  Sommer,Steven Eugene 11/24/2018, 4:14 AM

## 2018-11-24 NOTE — Progress Notes (Signed)
MD notified of plt count of 28. Instructed not to transfuse unless <10 or pt is actively bleeding. No active bleeding at this time

## 2018-11-25 ENCOUNTER — Inpatient Hospital Stay (HOSPITAL_COMMUNITY): Payer: BLUE CROSS/BLUE SHIELD | Admitting: Certified Registered Nurse Anesthetist

## 2018-11-25 ENCOUNTER — Encounter (HOSPITAL_COMMUNITY): Admission: EM | Disposition: E | Payer: Self-pay | Source: Home / Self Care

## 2018-11-25 ENCOUNTER — Inpatient Hospital Stay (HOSPITAL_COMMUNITY): Payer: BLUE CROSS/BLUE SHIELD

## 2018-11-25 DIAGNOSIS — D696 Thrombocytopenia, unspecified: Secondary | ICD-10-CM

## 2018-11-25 DIAGNOSIS — K7200 Acute and subacute hepatic failure without coma: Secondary | ICD-10-CM

## 2018-11-25 HISTORY — PX: LAPAROTOMY: SHX154

## 2018-11-25 LAB — COMPREHENSIVE METABOLIC PANEL
ALT: 2073 U/L — ABNORMAL HIGH (ref 0–44)
AST: 2152 U/L — ABNORMAL HIGH (ref 15–41)
Albumin: 1.5 g/dL — ABNORMAL LOW (ref 3.5–5.0)
Alkaline Phosphatase: 310 U/L — ABNORMAL HIGH (ref 38–126)
Anion gap: 17 — ABNORMAL HIGH (ref 5–15)
BUN: 27 mg/dL — ABNORMAL HIGH (ref 8–23)
CO2: 17 mmol/L — ABNORMAL LOW (ref 22–32)
Calcium: 6.2 mg/dL — CL (ref 8.9–10.3)
Chloride: 105 mmol/L (ref 98–111)
Creatinine, Ser: 2.09 mg/dL — ABNORMAL HIGH (ref 0.44–1.00)
GFR calc Af Amer: 28 mL/min — ABNORMAL LOW (ref >60)
GFR calc non Af Amer: 24 mL/min — ABNORMAL LOW (ref >60)
Glucose, Bld: 171 mg/dL — ABNORMAL HIGH (ref 70–99)
Potassium: 4.3 mmol/L (ref 3.5–5.1)
Sodium: 139 mmol/L (ref 135–145)
Total Bilirubin: 2.3 mg/dL — ABNORMAL HIGH (ref 0.3–1.2)
Total Protein: 4.3 g/dL — ABNORMAL LOW (ref 6.5–8.1)

## 2018-11-25 LAB — PREPARE PLATELET PHERESIS: Unit division: 0

## 2018-11-25 LAB — CBC
HCT: 32.4 % — ABNORMAL LOW (ref 36.0–46.0)
Hemoglobin: 11.1 g/dL — ABNORMAL LOW (ref 12.0–15.0)
MCH: 30.2 pg (ref 26.0–34.0)
MCHC: 34.3 g/dL (ref 30.0–36.0)
MCV: 88.3 fL (ref 80.0–100.0)
Platelets: 25 10*3/uL — CL (ref 150–400)
RBC: 3.67 MIL/uL — ABNORMAL LOW (ref 3.87–5.11)
RDW: 15.1 % (ref 11.5–15.5)
WBC: 19.8 10*3/uL — ABNORMAL HIGH (ref 4.0–10.5)
nRBC: 6.9 % — ABNORMAL HIGH (ref 0.0–0.2)

## 2018-11-25 LAB — BLOOD GAS, ARTERIAL
Acid-base deficit: 5.8 mmol/L — ABNORMAL HIGH (ref 0.0–2.0)
Bicarbonate: 17 mmol/L — ABNORMAL LOW (ref 20.0–28.0)
Drawn by: 232810
FIO2: 30
MECHVT: 430 mL
O2 Saturation: 96.1 %
PEEP: 5 cmH2O
Patient temperature: 36.9
RATE: 32 resp/min
pCO2 arterial: 27.3 mmHg — ABNORMAL LOW (ref 32.0–48.0)
pH, Arterial: 7.412 (ref 7.350–7.450)
pO2, Arterial: 95.2 mmHg (ref 83.0–108.0)

## 2018-11-25 LAB — RENAL FUNCTION PANEL
Albumin: 1.4 g/dL — ABNORMAL LOW (ref 3.5–5.0)
Anion gap: 13 (ref 5–15)
BUN: 24 mg/dL — ABNORMAL HIGH (ref 8–23)
CO2: 21 mmol/L — ABNORMAL LOW (ref 22–32)
Calcium: 6.1 mg/dL — CL (ref 8.9–10.3)
Chloride: 103 mmol/L (ref 98–111)
Creatinine, Ser: 1.72 mg/dL — ABNORMAL HIGH (ref 0.44–1.00)
GFR calc Af Amer: 36 mL/min — ABNORMAL LOW
GFR calc non Af Amer: 31 mL/min — ABNORMAL LOW
Glucose, Bld: 179 mg/dL — ABNORMAL HIGH (ref 70–99)
Phosphorus: 2.5 mg/dL (ref 2.5–4.6)
Potassium: 4.1 mmol/L (ref 3.5–5.1)
Sodium: 137 mmol/L (ref 135–145)

## 2018-11-25 LAB — GLUCOSE, CAPILLARY
Glucose-Capillary: 145 mg/dL — ABNORMAL HIGH (ref 70–99)
Glucose-Capillary: 147 mg/dL — ABNORMAL HIGH (ref 70–99)
Glucose-Capillary: 159 mg/dL — ABNORMAL HIGH (ref 70–99)
Glucose-Capillary: 173 mg/dL — ABNORMAL HIGH (ref 70–99)
Glucose-Capillary: 175 mg/dL — ABNORMAL HIGH (ref 70–99)
Glucose-Capillary: 187 mg/dL — ABNORMAL HIGH (ref 70–99)

## 2018-11-25 LAB — BPAM RBC
Blood Product Expiration Date: 202006262359
Blood Product Expiration Date: 202007012359
Blood Product Expiration Date: 202007012359
Blood Product Expiration Date: 202007052359
Blood Product Expiration Date: 202007082359
Blood Product Expiration Date: 202007082359
ISSUE DATE / TIME: 202006080020
ISSUE DATE / TIME: 202006080256
Unit Type and Rh: 5100
Unit Type and Rh: 5100
Unit Type and Rh: 5100
Unit Type and Rh: 5100
Unit Type and Rh: 5100
Unit Type and Rh: 9500

## 2018-11-25 LAB — TYPE AND SCREEN
ABO/RH(D): O POS
Antibody Screen: NEGATIVE
Unit division: 0
Unit division: 0
Unit division: 0
Unit division: 0
Unit division: 0
Unit division: 0

## 2018-11-25 LAB — BPAM PLATELET PHERESIS
Blood Product Expiration Date: 202006112359
Unit Type and Rh: 7300

## 2018-11-25 LAB — PROTIME-INR
INR: 1.6 — ABNORMAL HIGH (ref 0.8–1.2)
Prothrombin Time: 19.3 seconds — ABNORMAL HIGH (ref 11.4–15.2)

## 2018-11-25 LAB — PHOSPHORUS: Phosphorus: 3.4 mg/dL (ref 2.5–4.6)

## 2018-11-25 LAB — PREPARE RBC (CROSSMATCH)

## 2018-11-25 LAB — MAGNESIUM: Magnesium: 2.1 mg/dL (ref 1.7–2.4)

## 2018-11-25 SURGERY — LAPAROTOMY, EXPLORATORY
Anesthesia: General | Site: Abdomen

## 2018-11-25 MED ORDER — LIDOCAINE 2% (20 MG/ML) 5 ML SYRINGE
INTRAMUSCULAR | Status: AC
  Filled 2018-11-25: qty 5

## 2018-11-25 MED ORDER — SODIUM CHLORIDE 0.9% IV SOLUTION: Freq: Once | INTRAVENOUS | Status: DC

## 2018-11-25 MED ORDER — CALCIUM GLUCONATE-NACL 1-0.675 GM/50ML-% IV SOLN
1.0000 g | Freq: Once | INTRAVENOUS | Status: AC
  Administered 2018-11-25: 1000 mg via INTRAVENOUS
  Filled 2018-11-25: qty 50

## 2018-11-25 MED ORDER — ROCURONIUM BROMIDE 10 MG/ML (PF) SYRINGE
PREFILLED_SYRINGE | INTRAVENOUS | Status: DC | PRN
  Administered 2018-11-25: 50 mg via INTRAVENOUS

## 2018-11-25 MED ORDER — 0.9 % SODIUM CHLORIDE (POUR BTL) OPTIME
TOPICAL | Status: DC | PRN
  Administered 2018-11-25: 13:00:00 2000 mL

## 2018-11-25 MED ORDER — ROCURONIUM BROMIDE 10 MG/ML (PF) SYRINGE
PREFILLED_SYRINGE | INTRAVENOUS | Status: AC
  Filled 2018-11-25: qty 10

## 2018-11-25 MED ORDER — PHENYLEPHRINE 40 MCG/ML (10ML) SYRINGE FOR IV PUSH (FOR BLOOD PRESSURE SUPPORT)
PREFILLED_SYRINGE | INTRAVENOUS | Status: AC
  Filled 2018-11-25: qty 20

## 2018-11-25 MED ORDER — MIDAZOLAM HCL 2 MG/2ML IJ SOLN
INTRAMUSCULAR | Status: DC | PRN
  Administered 2018-11-25: 2 mg via INTRAVENOUS

## 2018-11-25 MED ORDER — MIDAZOLAM HCL 2 MG/2ML IJ SOLN
INTRAMUSCULAR | Status: AC
  Filled 2018-11-25: qty 2

## 2018-11-25 MED ORDER — SODIUM CHLORIDE 0.9 % IV SOLN
INTRAVENOUS | Status: DC | PRN
  Administered 2018-11-25: 14:00:00 via INTRAVENOUS

## 2018-11-25 MED ORDER — EPHEDRINE 5 MG/ML INJ
INTRAVENOUS | Status: AC
  Filled 2018-11-25: qty 10

## 2018-11-25 SURGICAL SUPPLY — 39 items
APL SWBSTK 6 STRL LF DISP (MISCELLANEOUS) ×1
APPLICATOR COTTON TIP 6 STRL (MISCELLANEOUS) ×1 IMPLANT
APPLICATOR COTTON TIP 6IN STRL (MISCELLANEOUS) ×3 IMPLANT
BLADE EXTENDED COATED 6.5IN (ELECTRODE) IMPLANT
BLADE HEX COATED 2.75 (ELECTRODE) ×3 IMPLANT
COVER MAYO STAND STRL (DRAPES) IMPLANT
COVER WAND RF STERILE (DRAPES) IMPLANT
DRAPE LAPAROSCOPIC ABDOMINAL (DRAPES) ×3 IMPLANT
DRAPE WARM FLUID 44X44 (DRAPES) IMPLANT
DRSG VAC ATS LRG SENSATRAC (GAUZE/BANDAGES/DRESSINGS) ×2 IMPLANT
ELECT REM PT RETURN 15FT ADLT (MISCELLANEOUS) ×3 IMPLANT
GAUZE SPONGE 4X4 12PLY STRL (GAUZE/BANDAGES/DRESSINGS) ×3 IMPLANT
GLOVE BIOGEL PI IND STRL 7.0 (GLOVE) ×1 IMPLANT
GLOVE BIOGEL PI INDICATOR 7.0 (GLOVE) ×2
GLOVE INDICATOR 8.0 STRL GRN (GLOVE) ×6 IMPLANT
GLOVE SS BIOGEL STRL SZ 8 (GLOVE) ×1 IMPLANT
GLOVE SUPERSENSE BIOGEL SZ 8 (GLOVE) ×2
GOWN STRL REUS W/TWL LRG LVL3 (GOWN DISPOSABLE) ×3 IMPLANT
GOWN STRL REUS W/TWL XL LVL3 (GOWN DISPOSABLE) ×6 IMPLANT
HANDLE SUCTION POOLE (INSTRUMENTS) IMPLANT
KIT BASIN OR (CUSTOM PROCEDURE TRAY) ×3 IMPLANT
KIT TURNOVER KIT A (KITS) IMPLANT
NS IRRIG 1000ML POUR BTL (IV SOLUTION) ×3 IMPLANT
PACK GENERAL/GYN (CUSTOM PROCEDURE TRAY) ×3 IMPLANT
SPONGE ABD ABTHERA ADVANCE (MISCELLANEOUS) ×2 IMPLANT
SPONGE LAP 18X18 RF (DISPOSABLE) IMPLANT
STAPLER VISISTAT 35W (STAPLE) ×3 IMPLANT
SUCTION POOLE HANDLE (INSTRUMENTS)
SUT PDS AB 1 CTX 36 (SUTURE) IMPLANT
SUT SILK 2 0 (SUTURE)
SUT SILK 2-0 18XBRD TIE 12 (SUTURE) IMPLANT
SUT SILK 3 0 (SUTURE)
SUT SILK 3 0 SH CR/8 (SUTURE) IMPLANT
SUT SILK 3-0 18XBRD TIE 12 (SUTURE) IMPLANT
SUT VIC AB 2-0 SH 18 (SUTURE) IMPLANT
SUT VIC AB 3-0 SH 18 (SUTURE) IMPLANT
TOWEL OR 17X26 10 PK STRL BLUE (TOWEL DISPOSABLE) ×6 IMPLANT
TRAY FOLEY MTR SLVR 16FR STAT (SET/KITS/TRAYS/PACK) IMPLANT
YANKAUER SUCT BULB TIP NO VENT (SUCTIONS) IMPLANT

## 2018-11-25 NOTE — Anesthesia Preprocedure Evaluation (Signed)
Anesthesia Evaluation  Patient identified by MRN, date of birth, ID band Patient confused    Reviewed: Allergy & Precautions, NPO status , Patient's Chart, lab work & pertinent test results, Unable to perform ROS - Chart review onlyPreop documentation limited or incomplete due to emergent nature of procedure.  Airway Mallampati: Intubated       Dental  (+) Dental Advisory Given, Edentulous Upper, Edentulous Lower   Pulmonary neg pulmonary ROS,    Pulmonary exam normal breath sounds clear to auscultation       Cardiovascular hypertension, Pt. on medications  Rhythm:Regular Rate:Tachycardia     Neuro/Psych negative neurological ROS  negative psych ROS   GI/Hepatic Neg liver ROS, ruptured bowel She has perforated small bowel with a large amount of free air as well as multiple incarcerated incisional hernias.    Endo/Other  diabetes, Type 2, Insulin Dependent, Oral Hypoglycemic AgentsCBG 495  Renal/GU Renal disease     Musculoskeletal negative musculoskeletal ROS (+)   Abdominal   Peds  Hematology negative hematology ROS (+)   Anesthesia Other Findings   Reproductive/Obstetrics                             Anesthesia Physical  Anesthesia Plan  ASA: IV  Anesthesia Plan: General   Post-op Pain Management:    Induction: Intravenous and Rapid sequence  PONV Risk Score and Plan: 4 or greater and Ondansetron, Treatment may vary due to age or medical condition and Propofol infusion  Airway Management Planned: Oral ETT  Additional Equipment: Arterial line and CVP  Intra-op Plan:   Post-operative Plan: Possible Post-op intubation/ventilation  Informed Consent: I have reviewed the patients History and Physical, chart, labs and discussed the procedure including the risks, benefits and alternatives for the proposed anesthesia with the patient or authorized representative who has indicated  his/her understanding and acceptance.     Dental advisory given, Only emergency history available and History available from chart only  Plan Discussed with: CRNA, Anesthesiologist and Surgeon  Anesthesia Plan Comments:         Anesthesia Quick Evaluation

## 2018-11-25 NOTE — Transfer of Care (Signed)
Immediate Anesthesia Transfer of Care Note  Patient: Carmen Stone  Procedure(s) Performed: EXPLORATORY LAPAROTOMY WITH REAPPLICATION OF WOUND VAC  (N/A Abdomen)  Patient Location: ICU  Anesthesia Type:General  Level of Consciousness: Patient remains intubated per anesthesia plan  Airway & Oxygen Therapy: Patient remains intubated per anesthesia plan and Patient placed on Ventilator (see vital sign flow sheet for setting)  Post-op Assessment: Report given to RN and Post -op Vital signs reviewed and stable  Post vital signs: Reviewed and stable  Last Vitals:  Vitals Value Taken Time  BP    Temp    Pulse    Resp    SpO2      Last Pain:  Vitals:   11/24/2018 1200  TempSrc: Core  PainSc:          Complications: No apparent anesthesia complications

## 2018-11-25 NOTE — Progress Notes (Signed)
Nutrition Follow-up  DOCUMENTATION CODES:   Not applicable  INTERVENTION:  - recommend initiation of TPN via double lumen R IJ.   NUTRITION DIAGNOSIS:   Inadequate oral intake related to inability to eat as evidenced by NPO status. -ongoing  GOAL:   Patient will meet greater than or equal to 90% of their needs -unmet/unable to meet at this time.  MONITOR:   Vent status, Labs, Weight trends, Skin, I & O's, Other (Comment)(initiation of nutrition support)  ASSESSMENT:   63 year old female with past medical history of type 2 DM, hyperlipidemia, HTN, inflammatory R breast cancer s/p radiation in 2015. Patient presented to the ED on 6/7 with severe abdominal pain, N/V. CT abdomen/pelvis which showed perforated small bowel with large amount of free air and multiple incarcerated incisional hernias.   Significant Events: 6/7- admission; ex lap, small bowel resection, LOAs, and placement of wound vac; NGT placement 6/9- CRRT initiation; return to OR for re-opening of open abdomen with washout   Patient currently out of the room. Patient remains intubated with NGT to LIS; no output documented in flow sheet this AM. Plan is for return to OR today for re-exploration and washout with possible need for resection and proximal diversion (per  Dr. Darrel Hoover note yesterday AM).   Estimated nutrition needs have been updated based on current status and changes and will continue to monitor for further need to adjust. Needs based on admission weight of 74.8 kg as weight significantly up since that time and that weight is consistent with weight trends since 05/2015. RN flow sheet indicates mild/moderate edema to BUE and mild edema to BLE.   Per notes: acute hypoxic respiratory failure in setting of peritonitis and septic shock from the same, concern for cardiomyopathy, AKI from ATN on CRRT, POD #3+3, acute metabolic encephalopathy, thrombocytopenia from sepsis, hypothermia.    Patient is currently  intubated on ventilator support MV: 13.7 L/min Temp (24hrs), Avg:97.4 F (36.3 C), Min:96.1 F (35.6 C), Max:98.6 F (37 C) Propofol: none   Medications reviewed; 50 mg solu-cortef QID, sliding scale novolog,  Labs reviewed; BUN: 27 mg/dl, creatinine: 2.09 mg/dl, Ca: 6.2 mg/dl, Alk Phos elevated, LFTs significantly elevated, GFR: 28 ml/min.  IVF; D10 @ 20 ml/hr (163 kcal) + D5-150 mEq sodium bicarb @ 75 ml/hr (306 kcal). Drips; fentanyl @ 100 mcg/hr, levo @ 30 mcg/min, vaso @ 0.03 units/min.    Diet Order:   Diet Order    None      EDUCATION NEEDS:   No education needs have been identified at this time  Skin:  Skin Assessment: Skin Integrity Issues: Skin Integrity Issues:: Incisions, Wound VAC Wound Vac: abdomen Incisions: abdomen (6/7+6/9)  Last BM:  6/11  Height:   Ht Readings from Last 1 Encounters:  12/06/2018 '5\' 4"'  (1.626 m)    Weight:   Wt Readings from Last 1 Encounters:  12/07/2018 90.6 kg    Ideal Body Weight:  54.5 kg  BMI:  Body mass index is 34.28 kg/m.  Estimated Nutritional Needs:   Kcal:  1628 kcal  Protein:  >/=165 grams  Fluid:  >/= 1.8 L/day     Jarome Matin, MS, RD, LDN, [facility] Inpatient Clinical Dietitian Pager # [phone] After hours/weekend pager # [phone]

## 2018-11-25 NOTE — Op Note (Signed)
Preoperative diagnosis: Status post exporter laparotomy with small bowel resection and subsequent sepsis and multisystem organ failure with open abdomen and discontinuity of GI tract  Postoperative diagnosis: Same  Procedure: Exploratory laparotomy with washout of abdomen and placement of vacuum VAC dressing  Surgeon: Erroll Luna, MD  Anesthesia: General  EBL: 10 cc  Drains: None  Specimen: None  Indications for procedure: The patient is a 63 year old female who underwent exporter laparotomy 5 days ago for a small bowel obstruction subsequent small bowel resection and was left open due to sepsis and loss of domain.  She returned 2 days ago and had a second look operation but still had some questionable areas of small bowel and third look operation was recommended.  She has been critically ill on multiple pressors and ventilatory support.  She continues on metabolic acidosis.  She returns today for third look and possible closure.  Description of procedure: Patient was brought from the ICU.  She was already intubated.  She is placed supine upon the operative table.  After induction of general esthesia the vacuum VAC dressing was removed.  She was prepped and draped in sterile fashion timeout was done.  At this point the small bowel was run.  Ligament of Treitz identified.  Small bowel was run to the proximal jejunum where the bowel was divided.  The distal bowel was sutured to this but not anastomosis.  This was then run all the way down to the ileocecal valve.  There is some dusky areas of small bowel but appeared viable.  There is significant edema and fibrinous exudate noted.  A sending colon, transverse colon, splenic flexure, descending colon and sigmoid colon and rectum were all normal but edematous.  No signs of ischemia.  Liver appeared normal and stomach appeared normal as well.  Given the amount of edema and the fact that she was on 3 pressors I did not feel anastomosis safe and pulling  the [place] ostomy would be too proximal and she would have short gut syndrome and significant fluid issues.  I felt was best to repack her and bring her back to void 8 hours with the hope that we could anastomose her at that point time since ostomy would not be a good option for her given her proximal division of the bowel.  After irrigation new vacuum pack dressing was placed in standard fashion.  Is placed to suction with good seal.  She was then returned to the ICU in critical condition.  All counts were correct.

## 2018-11-25 NOTE — Progress Notes (Signed)
NAME:  Carmen Stone, MRN:  [id number], DOB:  21-Sep-1955, LOS: 4 ADMISSION DATE:  11/24/2018, CONSULTATION DATE:  11/21/2018 REFERRING MD:  Dr. Ninfa Linden, Surgery, CHIEF COMPLAINT:  Abdominal pain   Brief History   63 yo female presented to ER with nausea, vomiting, abdominal pain.  CT abd/pelvis showed focal perforated loop of SB with free air with multiple complex ventral hernias.  Had emergent laparotomy with SB resection, lysis of adhesions and wound vac.  Remained on vent and pressors post op.  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, HTN, HLD, DM type II  Significant Hospital Events   6/07 Admit, to OR 6/08 transfuse FFP, on multiple pressors 6/09 To OR for wash out, start CRRT  Consults:  Renal 6/09 AKI, acidosis Cardiology 6/09 Takotsubo CM  Procedures:  ETT 6/07 >> Rt IJ HD 6/09 >> Rt [place] CVL 6/09 >> Lt radial aline 6/09 >>  Significant Diagnostic Tests:  CT abd/pelvis 6/07 >> focal perforated loop of SB with free air with multiple complex ventral hernias Echo 6/08 >> EF 20%, Takotsubo CM  Micro Data:  COVID 6/07 >> negative  Antimicrobials:  Zosyn 6/07 >> Diflucan 6/07 >>  Interim history/subjective:  Remains on pressors, CRRT, full vent support. Off norepinephrine  Objective   Blood pressure (!) 124/50, pulse 77, temperature 97.9 F (36.6 C), resp. rate (!) 32, height 5\' 4"  (1.626 m), weight 90.6 kg, SpO2 100 %. CVP:  [4 mmHg-13 mmHg] 4 mmHg  Vent Mode: PRVC FiO2 (%):  [30 %] 30 % Set Rate:  [32 bmp] 32 bmp Vt Set:  [430 mL] 430 mL PEEP:  [5 cmH20] 5 cmH20 Plateau Pressure:  [20 cmH20-24 cmH20] 24 cmH20   Intake/Output Summary (Last 24 hours) at 11/26/2018 0820 Last data filed at 12/07/2018 0700 Gross per 24 hour  Intake 5018.48 ml  Output 5734 ml  Net -715.52 ml   Filed Weights   11/24/2018 1700 11/24/18 0305 11/26/2018 0500  Weight: 93.2 kg 92.3 kg 90.6 kg    Examination:  Sedated, grimaces Pupils pinpoint  unreactive Endotracheal tube in place S1-S2 appreciated Fair air entry bilaterally, no rales Wound VAC in place 1+ edema No skin rash RASS -2  Chest x-ray reveals atelectasis, bilateral small effusion-reviewed by myself   Resolved Hospital Problem list     Assessment & Plan:   Acute hypoxic respiratory failure in setting of peritonitis -Continue vent support -Follow blood gases, follow radiological changes  Septic shock from peritonitis -On pressors-norepinephrine discontinued, weaning down Levophed, continue vasopressin -Maintain mean arterial pressure greater than 65 next -Day 5 of Zosyn, Diflucan  Acute systolic congestive heart failure with ejection fraction of 20%, 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 -Cardiology following -Hold outpatient lisinopril, Pravachol  AKI from ATN>> baseline creatinine of 0.73 from 12/17/5327 Metabolic acidosis Hypocalcemia, hypomagnesemia -Continue CRRT -Trend BMET, ABG -Continue bicarbonate drip  Status post small bowel resection, lysis of adhesions, wound VAC in place -for OR today  Type 2 diabetes Episode of hypoglycemia 11/24/2018 -We will continue D10-reduce to 20 cc an hour -SSI -Continue to hold metformin  Acute metabolic encephalopathy -RASS goal -1 to -2  Thrombocytopenia from sepsis -Continue to hold Lovenox -Follow CBC -No evidence of acute bleeding, platelet count however 25 -Defer to surgery regarding platelet transfusions  Hypothermia -Warming blanket as needed   Best practice:  Diet: Remain n.p.o. DVT prophylaxis: SCDs GI prophylaxis: Protonix Mobility: Bed rest Code Status: Full code Disposition: ICU  Labs  CMP Latest Ref Rng & Units 12/09/2018 11/24/2018 11/24/2018  Glucose 70 - 99 mg/dL 171(H) 115(H) 91  BUN 8 - 23 mg/dL 27(H) 28(H) 31(H)  Creatinine 0.44 - 1.00 mg/dL 2.09(H) 2.33(H) 2.65(H)  Sodium 135 - 145 mmol/L 139 142 144  Potassium 3.5 - 5.1 mmol/L 4.3 4.4 4.4  Chloride 98 - 111 mmol/L  105 105 107  CO2 22 - 32 mmol/L 17(L) 16(L) 16(L)  Calcium 8.9 - 10.3 mg/dL 6.2(LL) 6.5(L) 6.4(LL)  Total Protein 6.5 - 8.1 g/dL 4.3(L) - -  Total Bilirubin 0.3 - 1.2 mg/dL 2.3(H) - -  Alkaline Phos 38 - 126 U/L 310(H) - -  AST 15 - 41 U/L 2,152(H) - -  ALT 0 - 44 U/L 2,073(H) - -   CBC Latest Ref Rng & Units 11/22/2018 11/24/2018 11/24/2018  WBC 4.0 - 10.5 K/uL 19.8(H) 13.7(H) 10.2  Hemoglobin 12.0 - 15.0 g/dL 11.1(L) 11.5(L) 12.5  Hematocrit 36.0 - 46.0 % 32.4(L) 34.0(L) 39.2  Platelets 150 - 400 K/uL 25(LL) 28(LL) 43(L)   ABG    Component Value Date/Time   PHART 7.412 12/06/2018 0307   PCO2ART 27.3 (L) 12/01/2018 0307   PO2ART 95.2 12/03/2018 0307   HCO3 17.0 (L) 11/20/2018 0307   TCO2 16 (L) 11/15/2018 1658   ACIDBASEDEF 5.8 (H) 11/18/2018 0307   O2SAT 96.1 11/29/2018 0307   CBG (last 3)  Recent Labs    11/24/18 2008 11/24/18 2315 12/14/2018 0307  GLUCAP 88 168* 159*   The patient is critically ill with multiple organ system failure and requires high complexity decision making for assessment and support, frequent evaluation and titration of therapies, advanced monitoring, review of radiographic studies and interpretation of complex data.    Critical Care Time devoted to patient care services, exclusive of separately billable procedures, described in this note is 35 minutes.  Sherrilyn Rist, MD [place], PCCM Cell: [phone]

## 2018-11-25 NOTE — H&P (View-Only) (Signed)
2 Days Post-Op   Subjective/Chief Complaint: Pt intubated and on multiple pressors  BD of 6 but normal pH  Open abdomen Plan to return today for third look  Thrombocytopenic    Objective: Vital signs in last 24 hours: Temp:  [96.1 F (35.6 C)-98.6 F (37 C)] 97.9 F (36.6 C) (06/11 0700) Pulse Rate:  [77] 77 (06/11 0258) Resp:  [32] 32 (06/11 0258) BP: (124-136)/(50-55) 124/50 (06/11 0258) SpO2:  [98 %-100 %] 100 % (06/11 0735) Arterial Line BP: (89-151)/(44-61) 144/59 (06/11 0700) FiO2 (%):  [30 %] 30 % (06/11 0735) Weight:  [90.6 kg] 90.6 kg (06/11 0500) Last BM Date: 11/24/18  Intake/Output from previous day: 06/10 0701 - 06/11 0700 In: 5250.7 [I.V.:4826.4; IV Piggyback:399.4] Out: 5969 [Drains:525] Intake/Output this shift: No intake/output data recorded.  General appearance: on vent sedtaed   Abdomen open with vac in place   Lab Results:  Recent Labs    11/24/18 0313 11/24/18 2316  WBC 10.2 13.7*  HGB 12.5 11.5*  HCT 39.2 34.0*  PLT 43* 28*   BMET Recent Labs    11/24/18 1548 11/15/2018 0327  NA 142 139  K 4.4 4.3  CL 105 105  CO2 16* 17*  GLUCOSE 115* 171*  BUN 28* 27*  CREATININE 2.33* 2.09*  CALCIUM 6.5* 6.2*   PT/INR Recent Labs    12/08/2018 0327  LABPROT 19.3*  INR 1.6*   ABG Recent Labs    11/24/18 1604 11/20/2018 0307  PHART 7.324* 7.412  HCO3 14.1* 17.0*    Studies/Results: Dg Chest Port 1 View  Result Date: 11/24/2018 CLINICAL DATA:  Respiratory failure EXAM: PORTABLE CHEST 1 VIEW COMPARISON:  None. FINDINGS: Cardiac shadow is stable. Endotracheal tube, gastric catheter, right jugular and subclavian central lines are again seen and stable. Left chest wall port is noted. Pleural effusions are noted bilaterally stable from the prior exam. Bibasilar atelectasis is noted. No pneumothorax is seen. IMPRESSION: Stable appearance of the chest from the previous day. Electronically Signed   By: Inez Catalina M.D.   On: 11/24/2018 08:14    Dg Chest Port 1 View  Result Date: 12/06/2018 CLINICAL DATA:  Check central line placement EXAM: PORTABLE CHEST 1 VIEW COMPARISON:  11/16/2018 FINDINGS: Endotracheal tube, gastric catheter and left chest wall port are again seen and stable. Right subclavian central venous line is noted in distal superior vena cava. Right jugular temporary dialysis catheter is now seen. No pneumothorax is noted. Bilateral pleural effusions and basilar atelectasis are seen. No other focal abnormality is noted. IMPRESSION: No pneumothorax following central line placement. Bilateral effusions and bibasilar atelectasis. Electronically Signed   By: Inez Catalina M.D.   On: 11/20/2018 15:24    Anti-infectives: Anti-infectives (From admission, onward)   Start     Dose/Rate Route Frequency Ordered Stop   11/22/2018 1800  fluconazole (DIFLUCAN) IVPB 200 mg  Status:  Discontinued     200 mg 100 mL/hr over 60 Minutes Intravenous Every 24 hours 12/07/2018 0748 11/22/2018 0847   11/18/2018 1800  fluconazole (DIFLUCAN) IVPB 400 mg     400 mg 100 mL/hr over 120 Minutes Intravenous Every 24 hours 12/07/2018 0847     11/19/2018 1400  piperacillin-tazobactam (ZOSYN) IVPB 2.25 g     2.25 g 100 mL/hr over 30 Minutes Intravenous Every 6 hours 11/22/2018 0748     11/30/2018 0600  cefoTEtan (CEFOTAN) 2 g in sodium chloride 0.9 % 100 mL IVPB     2 g 200 mL/hr over 30 Minutes  Intravenous On call to O.R. 11/22/18 1300 12/09/2018 0624   11/22/18 1800  fluconazole (DIFLUCAN) IVPB 400 mg  Status:  Discontinued     400 mg 100 mL/hr over 120 Minutes Intravenous Every 24 hours 12/11/2018 1528 11/20/2018 0748   11/22/18 1800  vancomycin (VANCOCIN) 1,500 mg in sodium chloride 0.9 % 500 mL IVPB  Status:  Discontinued     1,500 mg 250 mL/hr over 120 Minutes Intravenous Every 24 hours 11/22/18 0358 11/22/18 0750   11/22/18 0115  vancomycin (VANCOCIN) 1,500 mg in sodium chloride 0.9 % 500 mL IVPB     1,500 mg 250 mL/hr over 120 Minutes Intravenous  Once 11/22/18  0101 11/22/18 0319   11/30/2018 1630  fluconazole (DIFLUCAN) IVPB 800 mg     800 mg 100 mL/hr over 240 Minutes Intravenous  Once 12/03/2018 1527 12/02/2018 2013   12/08/2018 1600  piperacillin-tazobactam (ZOSYN) IVPB 3.375 g  Status:  Discontinued     3.375 g 12.5 mL/hr over 240 Minutes Intravenous Every 8 hours 11/15/2018 1522 11/20/2018 0748   11/17/2018 0545  piperacillin-tazobactam (ZOSYN) IVPB 3.375 g     3.375 g 100 mL/hr over 30 Minutes Intravenous  Once 12/12/2018 0530 11/19/2018 0626   12/07/2018 0530  piperacillin-tazobactam (ZOSYN) IVPB 4.5 g  Status:  Discontinued     4.5 g 200 mL/hr over 30 Minutes Intravenous  Once 11/22/2018 0517 12/14/2018 0529      Assessment/Plan: s/p Procedure(s): EXPLORATORY LAPAROTOMY WITH [place] OUT (N/A) Re explore today  Not sure this is a survivable event but need to exclude any compromised viscous as cause given Dr Rosendo Gros previous  operative note and the fact she is in discontinuity  Continue supportive care for now  Spoke with her brother about condition and return to surgery today  Discussed her critical condition and possible need for ostomy and further surgery  Voice understanding and agrees to proceed  The procedure has been discussed with the patiens family. .  Alternative therapies have been discussed with the patient.  Operative risks include bleeding,  Infection,  Organ injury,  Nerve injury,  Blood vessel injury,  DVT,  Pulmonary embolism,  Death,  And possible reoperation.  Medical management risks include worsening of present situation.  The success of the procedure is 50 -90 % at treating patients symptoms.  The patient understands and agrees to proceed.   LOS: 4 days    Joyice Faster Annina Piotrowski 12/02/2018

## 2018-11-25 NOTE — Progress Notes (Signed)
Progress Note  Patient Name: Carmen Stone Date of Encounter: 11/19/2018  Primary Cardiologist: Buford Dresser, MD   Subjective   Sedated and intubated. VSS. Has been weaned off neo and is being weaned from levophed.   Inpatient Medications    Scheduled Meds: . sodium chloride   Intravenous Once  . chlorhexidine gluconate (MEDLINE KIT)  15 mL Mouth Rinse BID  . Chlorhexidine Gluconate Cloth  6 each Topical Daily  . dorzolamide-timolol  1 drop Both Eyes BID  . hydrocortisone sod succinate (SOLU-CORTEF) inj  50 mg Intravenous Q6H  . insulin aspart  3-9 Units Subcutaneous Q4H  . mouth rinse  15 mL Mouth Rinse 10 times per day  . pantoprazole (PROTONIX) IV  40 mg Intravenous Q24H   Continuous Infusions: .  prismasol BGK 4/2.5 Stopped (11/15/2018 1020)  .  prismasol BGK 4/2.5 Stopped (12/08/2018 1019)  . sodium chloride Stopped (12/06/2018 0658)  . dextrose 20 mL/hr at 11/18/2018 0825  . fentaNYL infusion INTRAVENOUS 75 mcg/hr (12/05/2018 0700)  . fluconazole (DIFLUCAN) IV Stopped (11/24/18 1900)  . norepinephrine (LEVOPHED) Adult infusion 40 mcg/min (12/03/2018 0700)  . phenylephrine (NEO-SYNEPHRINE) Adult infusion Stopped (12/05/2018 0543)  . piperacillin-tazobactam (ZOSYN)  IV 2.25 g (11/20/2018 0810)  . prismasol BGK 4/2.5 1,750 mL/hr at 11/24/18 1500  .  sodium bicarbonate  infusion 1000 mL 75 mL/hr at 12/02/2018 0700  . vasopressin (PITRESSIN) infusion - *FOR SHOCK* 0.03 Units/min (12/12/2018 0700)   PRN Meds: sodium chloride, acetaminophen, albuterol, alteplase, [DISCONTINUED] diphenhydrAMINE **OR** diphenhydrAMINE, fentaNYL, heparin, sodium chloride, sodium chloride flush   Vital Signs    Vitals:   12/02/2018 0500 11/19/2018 0600 12/11/2018 0700 12/04/2018 0735  BP:      Pulse:      Resp:      Temp: 98.4 F (36.9 C) 98.6 F (37 C) 97.9 F (36.6 C)   TempSrc:      SpO2:    100%  Weight: 90.6 kg     Height:        Intake/Output Summary (Last 24 hours) at 12/02/2018 0826  Last data filed at 11/16/2018 0700 Gross per 24 hour  Intake 5018.48 ml  Output 5734 ml  Net -715.52 ml   Last 3 Weights 12/11/2018 11/24/2018 12/12/2018  Weight (lbs) 199 lb 11.8 oz 203 lb 7.8 oz 205 lb 7.5 oz  Weight (kg) 90.6 kg 92.3 kg 93.2 kg      Telemetry    NSR 70's - Personally Reviewed  ECG    No new tracings - Personally Reviewed  Physical Exam   GEN: Intubated, No acute distress.   Neck: No JVD Cardiac: RRR, no murmurs, rubs, or gallops.  Respiratory: Clear to auscultation bilaterally. GI: taught MS: generalized edema; No deformity. Neuro:  sedated Psych: withdrawn  Labs    Chemistry Recent Labs  Lab 11/20/2018 0355  11/24/18 0313 11/24/18 1548 11/30/2018 0327  NA 136   < > 144 142 139  K 3.7   < > 4.4 4.4 4.3  CL 102   < > 107 105 105  CO2 23   < > 16* 16* 17*  GLUCOSE 495*   < > 91 115* 171*  BUN 18   < > 31* 28* 27*  CREATININE 0.73   < > 2.65* 2.33* 2.09*  CALCIUM 9.2   < > 6.4* 6.5* 6.2*  PROT 7.9  --   --   --  4.3*  ALBUMIN 4.2   < > 1.8* 1.8* 1.5*  AST 17  --   --   --  2,152*  ALT 18  --   --   --  2,073*  ALKPHOS 115  --   --   --  310*  BILITOT 0.5  --   --   --  2.3*  GFRNONAA >60   < > 18* 21* 24*  GFRAA >60   < > 21* 25* 28*  ANIONGAP 11   < > 21* 21* 17*   < > = values in this interval not displayed.     Hematology Recent Labs  Lab 11/24/18 0313 11/24/18 2316 12/08/2018 0730  WBC 10.2 13.7* 19.8*  RBC 4.24 3.76* 3.67*  HGB 12.5 11.5* 11.1*  HCT 39.2 34.0* 32.4*  MCV 92.5 90.4 88.3  MCH 29.5 30.6 30.2  MCHC 31.9 33.8 34.3  RDW 15.4 15.4 15.1  PLT 43* 28* 25*    Cardiac EnzymesNo results for input(s): TROPONINI in the last 168 hours. No results for input(s): TROPIPOC in the last 168 hours.   BNPNo results for input(s): BNP, PROBNP in the last 168 hours.   DDimer No results for input(s): DDIMER in the last 168 hours.   Radiology    Dg Chest Port 1 View  Result Date: 11/19/2018 CLINICAL DATA:  Respiratory failure EXAM:  PORTABLE CHEST 1 VIEW COMPARISON:  11/24/2018 FINDINGS: Endotracheal tube, gastric catheter and left chest wall port are stable. Right jugular and subclavian central lines are noted as well. No pneumothorax is seen. Bilateral pleural effusions are again noted and stable. No new focal infiltrate is noted. No bony abnormality is seen. IMPRESSION: Tubes and lines as described. Stable bilateral pleural effusions. Electronically Signed   By: Inez Catalina M.D.   On: 12/02/2018 08:10   Dg Chest Port 1 View  Result Date: 11/24/2018 CLINICAL DATA:  Respiratory failure EXAM: PORTABLE CHEST 1 VIEW COMPARISON:  None. FINDINGS: Cardiac shadow is stable. Endotracheal tube, gastric catheter, right jugular and subclavian central lines are again seen and stable. Left chest wall port is noted. Pleural effusions are noted bilaterally stable from the prior exam. Bibasilar atelectasis is noted. No pneumothorax is seen. IMPRESSION: Stable appearance of the chest from the previous day. Electronically Signed   By: Inez Catalina M.D.   On: 11/24/2018 08:14   Dg Chest Port 1 View  Result Date: 11/26/2018 CLINICAL DATA:  Check central line placement EXAM: PORTABLE CHEST 1 VIEW COMPARISON:  12/14/2018 FINDINGS: Endotracheal tube, gastric catheter and left chest wall port are again seen and stable. Right subclavian central venous line is noted in distal superior vena cava. Right jugular temporary dialysis catheter is now seen. No pneumothorax is noted. Bilateral pleural effusions and basilar atelectasis are seen. No other focal abnormality is noted. IMPRESSION: No pneumothorax following central line placement. Bilateral effusions and bibasilar atelectasis. Electronically Signed   By: Inez Catalina M.D.   On: 11/22/2018 15:24    Cardiac Studies   Echo 11/22/18: 1. The left ventricle had a visually estimated ejection fraction of of 20%. The cavity size was normal. The mid-apical LV wall segments were severely hypokinetic, the basal  segments were relatively preserved. This suggests a large LAD-territory  infarction versus stress (Takotsubo-type) cardiomyopathy. 2. The aortic root is normal in size and structure. 3. The right ventricle has normal systolc function. The cavity was normal. There is no increase in right ventricular wall thickness. 4. Limited echo.   Patient Profile     63 y.o. female with a hx of breast cancer s/p chemoradiation 2015, HTN, HLD, and DM2who is being seen  for the evaluation of stress cardiomyopathy.  Assessment & Plan    1. New onset systolic heart failure -Ischemic CM vs stress cardiomyopathy in setting of sepsis secondary to small bowel perforation and peritonitis -Not currently a cath candidate. Can consider limited echo to assess wall motion and EF once clinically improved to assess for possible stress cardiomyopathy improvement vs needing ischemic workup.  2. Acute kidney injury -In setting of perforated bowel and septic shock, suspected ATN -Little urine output -On CRRT followed by nephrology  3. Sepsis- Small bowel perforation/peritonitis -Pt intubated and on multiple pressors, weaned off neo, weaning off levo -Explor lap abd abdomen  washout on 6/7 and 6/9 -To return to surgery today for relook, probably after noon.  -Elevated LFTs -Platelets down to 28k  For questions or updates, please contact [facility] Please consult [email or web address] for contact info under        Signed, Daune Perch, NP  11/18/2018, 8:26 AM

## 2018-11-25 NOTE — Interval H&P Note (Signed)
History and Physical Interval Note:  11/29/2018 12:40 PM  Carmen Stone  has presented today for surgery, with the diagnosis of open abdomen.  The various methods of treatment have been discussed with the patient and family. After consideration of risks, benefits and other options for treatment, the patient has consented to  Procedure(s): REOPENING  [place] (N/A) as a surgical intervention.  The patient's history has been reviewed, patient examined, no change in status, stable for surgery.  I have reviewed the patient's chart and labs.  Questions were answered to the patient's satisfaction.   Critically ill but needs re exploration due to open abdomen High risk of mortality but needs return with possible resection D/W her brother and he agrees to proceed  The procedure has been discussed with the patient.  Alternative therapies have been discussed with the patient.  Operative risks include bleeding,  Infection,  Organ injury,  Nerve injury,  Blood vessel injury,  DVT,  Pulmonary embolism,  Death,  And possible reoperation.  Medical management risks include worsening of present situation.  The success of the procedure is 50 -90 % at treating patients symptoms.  The patient understands and agrees to proceed.   [place]

## 2018-11-25 NOTE — Progress Notes (Signed)
Will hold Diflucan  Could be hepatotoxic Marked increase in transaminases likely related to shocked liver  Will monitor

## 2018-11-25 NOTE — Progress Notes (Signed)
[facility] Progress Note  Subjective: I/O's even, wt's down 1.5 kg.  Pressors down from 3 > 2. Back to OR today for 3rd look.  WBC rising to 19k today. LFT's up  Vitals:   11/18/2018 0735 12/14/2018 0800 12/04/2018 0900 12/05/2018 1000  BP:  (!) 101/57    Pulse:      Resp:      Temp:  (!) 97 F (36.1 C) (!) 96.3 F (35.7 C) (!) 95.5 F (35.3 C)  TempSrc:  Core (Comment)    SpO2: 100% 100% 100%   Weight:      Height:        Inpatient medications: . sodium chloride   Intravenous Once  . chlorhexidine gluconate (MEDLINE KIT)  15 mL Mouth Rinse BID  . Chlorhexidine Gluconate Cloth  6 each Topical Daily  . dorzolamide-timolol  1 drop Both Eyes BID  . hydrocortisone sod succinate (SOLU-CORTEF) inj  50 mg Intravenous Q6H  . insulin aspart  3-9 Units Subcutaneous Q4H  . mouth rinse  15 mL Mouth Rinse 10 times per day  . pantoprazole (PROTONIX) IV  40 mg Intravenous Q24H   .  prismasol BGK 4/2.5 400 mL/hr at 12/06/2018 0941  .  prismasol BGK 4/2.5 200 mL/hr at 11/24/2018 0950  . sodium chloride 10 mL/hr at 12/09/2018 1000  . dextrose 20 mL/hr at 12/12/2018 1000  . fentaNYL infusion INTRAVENOUS 100 mcg/hr (12/07/2018 1000)  . norepinephrine (LEVOPHED) Adult infusion 25 mcg/min (11/30/2018 1000)  . phenylephrine (NEO-SYNEPHRINE) Adult infusion Stopped (11/30/2018 0543)  . piperacillin-tazobactam (ZOSYN)  IV Stopped (12/09/2018 0840)  . prismasol BGK 4/2.5 1,750 mL/hr at 11/26/2018 0800  .  sodium bicarbonate  infusion 1000 mL 75 mL/hr at 12/02/2018 1000  . vasopressin (PITRESSIN) infusion - *FOR SHOCK* 0.03 Units/min (11/15/2018 1000)   sodium chloride, acetaminophen, albuterol, alteplase, [DISCONTINUED] diphenhydrAMINE **OR** diphenhydrAMINE, fentaNYL, heparin, sodium chloride, sodium chloride flush    Exam: Gen on vent, sedated, ETT  No jvd or bruits Chest clear bilat ant and lat RRR no MRG Abd soft ntnd no mass or ascites +bs, large midline wound vac  GU foley cath minimal urine Ext  diffuse LE/ UE 2+ edema Neuro is sedated on the vent   Home meds:  - insulin glargine 10u/ metformin 500 bid/ novolog 70/30 20u bid ac  - atorvastatin 10/ levothyroxine 25 ug/ pravastatin 10   UA neg on 6/7  UNa 35,  UCr 34  CXR 6/9 > bilat effusions and bilat atx  Assessment: 1. Acute renal failure - in setting of septic shock due to perf bowel, suspected ATN. CRRT d#3 today. Remains on pressors. Not making urine. Cont CRRT, keeping even.  2. Perforated small bowel / septic shock- per CCM/ gen surg. Back to OR today 3. Thrombocytopenia - significant, prob d/t sepsis 4. DM on insulin 5. Vol overload - w/ bilat effusions on CXR and diffuse edema on exam, up [place] 12/09/2018, 11:07 AM  Iron/TIBC/Ferritin/ %Sat No results found for: IRON, TIBC, FERRITIN, IRONPCTSAT Recent Labs  Lab 11/24/2018 0327  NA 139  K 4.3  CL 105  CO2 17*  GLUCOSE 171*  BUN 27*  CREATININE 2.09*  CALCIUM 6.2*  PHOS 3.4  ALBUMIN 1.5*  INR 1.6*   Recent Labs  Lab 12/03/2018 0327  AST 2,152*  ALT 2,073*  ALKPHOS 310*  BILITOT 2.3*  PROT 4.3*   Recent Labs  Lab 11/19/2018 0730  WBC 19.8*  HGB 11.1*  HCT 32.4*  PLT 25*

## 2018-11-25 NOTE — Progress Notes (Signed)
2 Days Post-Op   Subjective/Chief Complaint: Pt intubated and on multiple pressors  BD of 6 but normal pH  Open abdomen Plan to return today for third look  Thrombocytopenic    Objective: Vital signs in last 24 hours: Temp:  [96.1 F (35.6 C)-98.6 F (37 C)] 97.9 F (36.6 C) (06/11 0700) Pulse Rate:  [77] 77 (06/11 0258) Resp:  [32] 32 (06/11 0258) BP: (124-136)/(50-55) 124/50 (06/11 0258) SpO2:  [98 %-100 %] 100 % (06/11 0735) Arterial Line BP: (89-151)/(44-61) 144/59 (06/11 0700) FiO2 (%):  [30 %] 30 % (06/11 0735) Weight:  [90.6 kg] 90.6 kg (06/11 0500) Last BM Date: 11/24/18  Intake/Output from previous day: 06/10 0701 - 06/11 0700 In: 5250.7 [I.V.:4826.4; IV Piggyback:399.4] Out: 5969 [Drains:525] Intake/Output this shift: No intake/output data recorded.  General appearance: on vent sedtaed   Abdomen open with vac in place   Lab Results:  Recent Labs    11/24/18 0313 11/24/18 2316  WBC 10.2 13.7*  HGB 12.5 11.5*  HCT 39.2 34.0*  PLT 43* 28*   BMET Recent Labs    11/24/18 1548 12/12/2018 0327  NA 142 139  K 4.4 4.3  CL 105 105  CO2 16* 17*  GLUCOSE 115* 171*  BUN 28* 27*  CREATININE 2.33* 2.09*  CALCIUM 6.5* 6.2*   PT/INR Recent Labs    11/20/2018 0327  LABPROT 19.3*  INR 1.6*   ABG Recent Labs    11/24/18 1604 11/30/2018 0307  PHART 7.324* 7.412  HCO3 14.1* 17.0*    Studies/Results: Dg Chest Port 1 View  Result Date: 11/24/2018 CLINICAL DATA:  Respiratory failure EXAM: PORTABLE CHEST 1 VIEW COMPARISON:  None. FINDINGS: Cardiac shadow is stable. Endotracheal tube, gastric catheter, right jugular and subclavian central lines are again seen and stable. Left chest wall port is noted. Pleural effusions are noted bilaterally stable from the prior exam. Bibasilar atelectasis is noted. No pneumothorax is seen. IMPRESSION: Stable appearance of the chest from the previous day. Electronically Signed   By: Inez Catalina M.D.   On: 11/24/2018 08:14    Dg Chest Port 1 View  Result Date: 11/19/2018 CLINICAL DATA:  Check central line placement EXAM: PORTABLE CHEST 1 VIEW COMPARISON:  12/08/2018 FINDINGS: Endotracheal tube, gastric catheter and left chest wall port are again seen and stable. Right subclavian central venous line is noted in distal superior vena cava. Right jugular temporary dialysis catheter is now seen. No pneumothorax is noted. Bilateral pleural effusions and basilar atelectasis are seen. No other focal abnormality is noted. IMPRESSION: No pneumothorax following central line placement. Bilateral effusions and bibasilar atelectasis. Electronically Signed   By: Inez Catalina M.D.   On: 11/17/2018 15:24    Anti-infectives: Anti-infectives (From admission, onward)   Start     Dose/Rate Route Frequency Ordered Stop   12/12/2018 1800  fluconazole (DIFLUCAN) IVPB 200 mg  Status:  Discontinued     200 mg 100 mL/hr over 60 Minutes Intravenous Every 24 hours 12/11/2018 0748 11/20/2018 0847   12/02/2018 1800  fluconazole (DIFLUCAN) IVPB 400 mg     400 mg 100 mL/hr over 120 Minutes Intravenous Every 24 hours 12/12/2018 0847     12/02/2018 1400  piperacillin-tazobactam (ZOSYN) IVPB 2.25 g     2.25 g 100 mL/hr over 30 Minutes Intravenous Every 6 hours 12/12/2018 0748     11/26/2018 0600  cefoTEtan (CEFOTAN) 2 g in sodium chloride 0.9 % 100 mL IVPB     2 g 200 mL/hr over 30 Minutes  Intravenous On call to O.R. 11/22/18 1300 12/10/2018 0624   11/22/18 1800  fluconazole (DIFLUCAN) IVPB 400 mg  Status:  Discontinued     400 mg 100 mL/hr over 120 Minutes Intravenous Every 24 hours 12/12/2018 1528 11/26/2018 0748   11/22/18 1800  vancomycin (VANCOCIN) 1,500 mg in sodium chloride 0.9 % 500 mL IVPB  Status:  Discontinued     1,500 mg 250 mL/hr over 120 Minutes Intravenous Every 24 hours 11/22/18 0358 11/22/18 0750   11/22/18 0115  vancomycin (VANCOCIN) 1,500 mg in sodium chloride 0.9 % 500 mL IVPB     1,500 mg 250 mL/hr over 120 Minutes Intravenous  Once 11/22/18  0101 11/22/18 0319   12/01/2018 1630  fluconazole (DIFLUCAN) IVPB 800 mg     800 mg 100 mL/hr over 240 Minutes Intravenous  Once 11/30/2018 1527 12/06/2018 2013   11/24/2018 1600  piperacillin-tazobactam (ZOSYN) IVPB 3.375 g  Status:  Discontinued     3.375 g 12.5 mL/hr over 240 Minutes Intravenous Every 8 hours 11/20/2018 1522 11/26/2018 0748   11/19/2018 0545  piperacillin-tazobactam (ZOSYN) IVPB 3.375 g     3.375 g 100 mL/hr over 30 Minutes Intravenous  Once 12/03/2018 0530 11/20/2018 0626   12/09/2018 0530  piperacillin-tazobactam (ZOSYN) IVPB 4.5 g  Status:  Discontinued     4.5 g 200 mL/hr over 30 Minutes Intravenous  Once 11/29/2018 0517 11/15/2018 0529      Assessment/Plan: s/p Procedure(s): EXPLORATORY LAPAROTOMY WITH [place] OUT (N/A) Re explore today  Not sure this is a survivable event but need to exclude any compromised viscous as cause given Dr Rosendo Gros previous  operative note and the fact she is in discontinuity  Continue supportive care for now  Spoke with her brother about condition and return to surgery today  Discussed her critical condition and possible need for ostomy and further surgery  Voice understanding and agrees to proceed  The procedure has been discussed with the patiens family. .  Alternative therapies have been discussed with the patient.  Operative risks include bleeding,  Infection,  Organ injury,  Nerve injury,  Blood vessel injury,  DVT,  Pulmonary embolism,  Death,  And possible reoperation.  Medical management risks include worsening of present situation.  The success of the procedure is 50 -90 % at treating patients symptoms.  The patient understands and agrees to proceed.   LOS: 4 days    Carmen Stone 11/20/2018

## 2018-11-25 NOTE — Progress Notes (Addendum)
Notified MD Olalere in regards to patient not hypoglycemic during this shift. Patient CBGs have been increasing and patient requiring insulin. MD Olalere ordered to discontinue the 10% dextrose gtt. Will complete.

## 2018-11-25 NOTE — Progress Notes (Signed)
CRITICAL VALUE ALERT  Critical Value:  Calcium 6.1   Date & Time Notified:  7847 and 6/11    Provider Notified: MD Ander Slade   Orders Received/Actions taken: Replace calcium with 1gram of Calcium gluconate IV. Will place order and replace.

## 2018-11-25 NOTE — Anesthesia Postprocedure Evaluation (Signed)
Anesthesia Post Note  Patient: Carmen Stone  Procedure(s) Performed: EXPLORATORY LAPAROTOMY WITH REAPPLICATION OF WOUND VAC  (N/A Abdomen)     Patient location during evaluation: SICU Anesthesia Type: General Level of consciousness: sedated Pain management: pain level controlled Vital Signs Assessment: post-procedure vital signs reviewed and stable Respiratory status: patient remains intubated per anesthesia plan Cardiovascular status: stable Postop Assessment: no apparent nausea or vomiting Anesthetic complications: no    Last Vitals:  Vitals:   12/14/2018 1200 11/17/2018 1300  BP:    Pulse:    Resp:    Temp: (!) 34.8 C (!) 35 C  SpO2: 100% 100%    Last Pain:  Vitals:   11/18/2018 1200  TempSrc: Core  PainSc:                  Valerian Jewel DANIEL

## 2018-11-25 NOTE — Anesthesia Preprocedure Evaluation (Deleted)
Anesthesia Evaluation  Patient identified by MRN, date of birth, ID band Patient unresponsive    Reviewed: Patient's Chart, lab work & pertinent test results, Unable to perform ROS - Chart review only  History of Anesthesia Complications Negative for: history of anesthetic complications  Airway        Dental   Pulmonary           Cardiovascular hypertension,      Neuro/Psych    GI/Hepatic   Endo/Other  diabetes  Renal/GU      Musculoskeletal   Abdominal   Peds  Hematology   Anesthesia Other Findings   Reproductive/Obstetrics                            Anesthesia Physical Anesthesia Plan Anesthesia Quick Evaluation                                  Anesthesia Evaluation  Patient identified by MRN, date of birth, ID band Patient confused  General Assessment Comment:Patient minimally responsive  Reviewed: Allergy & Precautions, NPO status , Patient's Chart, lab work & pertinent test results, Unable to perform ROS - Chart review onlyPreop documentation limited or incomplete due to emergent nature of procedure.  Airway Mallampati: II  TM Distance: >3 FB Neck ROM: Full    Dental  (+) Dental Advisory Given, Edentulous Upper, Edentulous Lower   Pulmonary neg pulmonary ROS,    Pulmonary exam normal breath sounds clear to auscultation       Cardiovascular hypertension, Pt. on medications  Rhythm:Regular Rate:Tachycardia     Neuro/Psych negative neurological ROS  negative psych ROS   GI/Hepatic Neg liver ROS, ruptured bowel She has perforated small bowel with a large amount of free air as well as multiple incarcerated incisional hernias.    Endo/Other  diabetes, Type 2, Insulin Dependent, Oral Hypoglycemic AgentsCBG 495  Renal/GU Renal disease     Musculoskeletal negative musculoskeletal ROS (+)   Abdominal   Peds  Hematology negative hematology ROS (+)    Anesthesia Other Findings Day of surgery medications reviewed with the patient.  Inflammatory breast cancer s/p chemo/radiation  Reproductive/Obstetrics                             Anesthesia Physical  Anesthesia Plan  ASA: IV  Anesthesia Plan: General   Post-op Pain Management:    Induction: Intravenous and Rapid sequence  PONV Risk Score and Plan: 4 or greater and Ondansetron, Treatment may vary due to age or medical condition and Propofol infusion  Airway Management Planned: Oral ETT  Additional Equipment: Arterial line and CVP  Intra-op Plan:   Post-operative Plan: Possible Post-op intubation/ventilation  Informed Consent: I have reviewed the patients History and Physical, chart, labs and discussed the procedure including the risks, benefits and alternatives for the proposed anesthesia with the patient or authorized representative who has indicated his/her understanding and acceptance.     Dental advisory given, Only emergency history available and History available from chart only  Plan Discussed with: CRNA  Anesthesia Plan Comments:         Anesthesia Quick Evaluation                                   Anesthesia Evaluation  Patient  identified by MRN, date of birth, ID band Patient confused  General Assessment Comment:Patient minimally responsive  Reviewed: Allergy & Precautions, NPO status , Patient's Chart, lab work & pertinent test results, Unable to perform ROS - Chart review onlyPreop documentation limited or incomplete due to emergent nature of procedure.  Airway Mallampati: II  TM Distance: >3 FB Neck ROM: Full    Dental  (+) Dental Advisory Given, Edentulous Upper, Edentulous Lower   Pulmonary neg pulmonary ROS,    Pulmonary exam normal breath sounds clear to auscultation       Cardiovascular hypertension, Pt. on medications  Rhythm:Regular Rate:Tachycardia     Neuro/Psych negative neurological  ROS  negative psych ROS   GI/Hepatic Neg liver ROS, ruptured bowel She has perforated small bowel with a large amount of free air as well as multiple incarcerated incisional hernias.    Endo/Other  diabetes, Type 2, Insulin Dependent, Oral Hypoglycemic AgentsCBG 495  Renal/GU Renal disease     Musculoskeletal negative musculoskeletal ROS (+)   Abdominal   Peds  Hematology negative hematology ROS (+)   Anesthesia Other Findings Day of surgery medications reviewed with the patient.  Inflammatory breast cancer s/p chemo/radiation  Reproductive/Obstetrics                             Anesthesia Physical  Anesthesia Plan  ASA: IV  Anesthesia Plan: General   Post-op Pain Management:    Induction: Intravenous and Rapid sequence  PONV Risk Score and Plan: 4 or greater and Ondansetron, Treatment may vary due to age or medical condition and Propofol infusion  Airway Management Planned: Oral ETT  Additional Equipment: Arterial line and CVP  Intra-op Plan:   Post-operative Plan: Possible Post-op intubation/ventilation  Informed Consent: I have reviewed the patients History and Physical, chart, labs and discussed the procedure including the risks, benefits and alternatives for the proposed anesthesia with the patient or authorized representative who has indicated his/her understanding and acceptance.     Dental advisory given, Only emergency history available and History available from chart only  Plan Discussed with: CRNA  Anesthesia Plan Comments:         Anesthesia Quick Evaluation

## 2018-11-25 NOTE — Progress Notes (Signed)
[facility] Progress Note Patient Name: Carmen Stone DOB: 15-Mar-1956 MRN: [id number]   Date of Service  12/05/2018  HPI/Events of Note  Hypocalcemia - Ca++ = 6.2.   eICU Interventions  Will replace Ca++.      Intervention Category Major Interventions: Electrolyte abnormality - evaluation and management  Kalmen Lollar [place] 12/09/2018, 6:29 AM

## 2018-11-26 ENCOUNTER — Inpatient Hospital Stay (HOSPITAL_COMMUNITY): Payer: BLUE CROSS/BLUE SHIELD

## 2018-11-26 ENCOUNTER — Encounter (HOSPITAL_COMMUNITY): Payer: Self-pay | Admitting: Surgery

## 2018-11-26 LAB — BLOOD GAS, ARTERIAL
Acid-Base Excess: 2.5 mmol/L — ABNORMAL HIGH (ref 0.0–2.0)
Acid-Base Excess: 3.2 mmol/L — ABNORMAL HIGH (ref 0.0–2.0)
Bicarbonate: 23.4 mmol/L (ref 20.0–28.0)
Bicarbonate: 25.7 mmol/L (ref 20.0–28.0)
Drawn by: 235321
Drawn by: 270211
FIO2: 30
FIO2: 0.3
MECHVT: 430 mL
MECHVT: 430 mL
O2 Saturation: 96.8 %
O2 Saturation: 98 %
PEEP: 5 cmH2O
PEEP: 5 cmH2O
Patient temperature: 36.6
Patient temperature: 97.5
RATE: 32 resp/min
RATE: 20 resp/min
pCO2 arterial: 25.7 mmHg — ABNORMAL LOW (ref 32.0–48.0)
pCO2 arterial: 31.6 mmHg — ABNORMAL LOW (ref 32.0–48.0)
pH, Arterial: 7.566 — ABNORMAL HIGH (ref 7.350–7.450)
pH, Arterial: 7.517 — ABNORMAL HIGH (ref 7.350–7.450)
pO2, Arterial: 84.5 mmHg (ref 83.0–108.0)
pO2, Arterial: 109 mmHg — ABNORMAL HIGH (ref 83.0–108.0)

## 2018-11-26 LAB — GLUCOSE, CAPILLARY
Glucose-Capillary: 106 mg/dL — ABNORMAL HIGH (ref 70–99)
Glucose-Capillary: 108 mg/dL — ABNORMAL HIGH (ref 70–99)
Glucose-Capillary: 121 mg/dL — ABNORMAL HIGH (ref 70–99)
Glucose-Capillary: 146 mg/dL — ABNORMAL HIGH (ref 70–99)
Glucose-Capillary: 150 mg/dL — ABNORMAL HIGH (ref 70–99)
Glucose-Capillary: 153 mg/dL — ABNORMAL HIGH (ref 70–99)

## 2018-11-26 LAB — HEPATIC FUNCTION PANEL
ALT: 1487 U/L — ABNORMAL HIGH (ref 0–44)
AST: 1120 U/L — ABNORMAL HIGH (ref 15–41)
Albumin: 1.4 g/dL — ABNORMAL LOW (ref 3.5–5.0)
Alkaline Phosphatase: 549 U/L — ABNORMAL HIGH (ref 38–126)
Bilirubin, Direct: 2.4 mg/dL — ABNORMAL HIGH (ref 0.0–0.2)
Indirect Bilirubin: 0.7 mg/dL (ref 0.3–0.9)
Total Bilirubin: 3.1 mg/dL — ABNORMAL HIGH (ref 0.3–1.2)
Total Protein: 4.2 g/dL — ABNORMAL LOW (ref 6.5–8.1)

## 2018-11-26 LAB — RENAL FUNCTION PANEL
Albumin: 1.4 g/dL — ABNORMAL LOW (ref 3.5–5.0)
Albumin: 1.3 g/dL — ABNORMAL LOW (ref 3.5–5.0)
Anion gap: 14 (ref 5–15)
Anion gap: 9 (ref 5–15)
BUN: 22 mg/dL (ref 8–23)
BUN: 20 mg/dL (ref 8–23)
CO2: 23 mmol/L (ref 22–32)
CO2: 27 mmol/L (ref 22–32)
Calcium: 6.4 mg/dL — CL (ref 8.9–10.3)
Calcium: 6.6 mg/dL — ABNORMAL LOW (ref 8.9–10.3)
Chloride: 101 mmol/L (ref 98–111)
Chloride: 102 mmol/L (ref 98–111)
Creatinine, Ser: 1.52 mg/dL — ABNORMAL HIGH (ref 0.44–1.00)
Creatinine, Ser: 1.31 mg/dL — ABNORMAL HIGH (ref 0.44–1.00)
GFR calc Af Amer: 42 mL/min — ABNORMAL LOW (ref >60)
GFR calc Af Amer: 50 mL/min — ABNORMAL LOW (ref >60)
GFR calc non Af Amer: 36 mL/min — ABNORMAL LOW (ref >60)
GFR calc non Af Amer: 43 mL/min — ABNORMAL LOW (ref >60)
Glucose, Bld: 159 mg/dL — ABNORMAL HIGH (ref 70–99)
Glucose, Bld: 143 mg/dL — ABNORMAL HIGH (ref 70–99)
Phosphorus: 1.9 mg/dL — ABNORMAL LOW (ref 2.5–4.6)
Phosphorus: 2.2 mg/dL — ABNORMAL LOW (ref 2.5–4.6)
Potassium: 4.1 mmol/L (ref 3.5–5.1)
Potassium: 5 mmol/L (ref 3.5–5.1)
Sodium: 138 mmol/L (ref 135–145)
Sodium: 138 mmol/L (ref 135–145)

## 2018-11-26 LAB — MAGNESIUM: Magnesium: 2.3 mg/dL (ref 1.7–2.4)

## 2018-11-26 MED ORDER — CALCIUM GLUCONATE-NACL 1-0.675 GM/50ML-% IV SOLN
1.0000 g | Freq: Once | INTRAVENOUS | Status: AC
  Administered 2018-11-26: 1000 mg via INTRAVENOUS
  Filled 2018-11-26: qty 50

## 2018-11-26 MED ORDER — MIDAZOLAM HCL 2 MG/2ML IJ SOLN
2.0000 mg | Freq: Once | INTRAMUSCULAR | Status: AC
  Administered 2018-11-26: 2 mg via INTRAVENOUS
  Filled 2018-11-26: qty 2

## 2018-11-26 NOTE — Progress Notes (Signed)
CRITICAL VALUE ALERT  Critical Value:  Ca 6.4  Date & Time Notied:  11/26/18 at [place]  Provider Notified: Dr. Jimmy Footman notified at [phone]  Orders Received/Actions taken: Awaiting orders. Will continue to monitor.

## 2018-11-26 NOTE — Progress Notes (Addendum)
[facility] Progress Note  1 Day Post-Op  Subjective: CC-  Patient down to 2 pressors.  On CRRT day #4. No urine output.  Planning to go back to OR tomorrow.  Objective: Vital signs in last 24 hours: Temp:  [94.6 F (34.8 C)-99.1 F (37.3 C)] 97.3 F (36.3 C) (06/12 0600) Pulse Rate:  [73-91] 75 (06/12 0600) Resp:  [29-32] 32 (06/12 0600) BP: (101)/(57) 101/57 (06/11 0800) SpO2:  [97 %-100 %] 100 % (06/12 0727) Arterial Line BP: (78-155)/(39-71) 114/62 (06/12 0600) FiO2 (%):  [30 %] 30 % (06/12 0727) Weight:  [88.3 kg] 88.3 kg (06/12 0300) Last BM Date: 11/22/2018(per previous documentation)  Intake/Output from previous day: 06/11 0701 - 06/12 0700 In: 3967.3 [I.V.:3502.2; IV Piggyback:380.1] Out: 3781 [Drains:690; Blood:20] Intake/Output this shift: No intake/output data recorded.  PE: Gen:  Sedated on vent HEENT: ETT and NG in place Card:  RRR Pulm:  Coarse breath sounds bilaterally, on vent Abd: vac in place, soft Ext:  1+ edema BUE/BLE Skin: warm and dry  Lab Results:  Recent Labs    11/24/18 2316 12/12/2018 0730  WBC 13.7* 19.8*  HGB 11.5* 11.1*  HCT 34.0* 32.4*  PLT 28* 25*   BMET Recent Labs    12/12/2018 1748 11/26/18 0500  NA 137 138  K 4.1 4.1  CL 103 101  CO2 21* 23  GLUCOSE 179* 159*  BUN 24* 22  CREATININE 1.72* 1.52*  CALCIUM 6.1* 6.4*   PT/INR Recent Labs    12/09/2018 0327  LABPROT 19.3*  INR 1.6*   CMP     Component Value Date/Time   NA 138 11/26/2018 0500   NA 138 06/01/2017 0859   K 4.1 11/26/2018 0500   K 4.2 06/01/2017 0859   CL 101 11/26/2018 0500   CO2 23 11/26/2018 0500   CO2 24 06/01/2017 0859   GLUCOSE 159 (H) 11/26/2018 0500   GLUCOSE 118 06/01/2017 0859   BUN 22 11/26/2018 0500   BUN 15.2 06/01/2017 0859   CREATININE 1.52 (H) 11/26/2018 0500   CREATININE 0.79 06/02/2018 0856   CREATININE 0.7 06/01/2017 0859   CALCIUM 6.4 (LL) 11/26/2018 0500   CALCIUM 9.0 06/01/2017 0859   PROT 4.2 (L)  11/26/2018 0500   PROT 7.0 06/01/2017 0859   ALBUMIN 1.4 (L) 11/26/2018 0500   ALBUMIN 1.4 (L) 11/26/2018 0500   ALBUMIN 3.7 06/01/2017 0859   AST 1,120 (H) 11/26/2018 0500   AST 13 (L) 06/02/2018 0856   AST 14 06/01/2017 0859   ALT 1,487 (H) 11/26/2018 0500   ALT 16 06/02/2018 0856   ALT 12 06/01/2017 0859   ALKPHOS 549 (H) 11/26/2018 0500   ALKPHOS 97 06/01/2017 0859   BILITOT 3.1 (H) 11/26/2018 0500   BILITOT 0.3 06/02/2018 0856   BILITOT 0.22 06/01/2017 0859   GFRNONAA 36 (L) 11/26/2018 0500   GFRNONAA >60 06/02/2018 0856   GFRAA 42 (L) 11/26/2018 0500   GFRAA >60 06/02/2018 0856   Lipase     Component Value Date/Time   LIPASE 24 12/12/2018 0355       Studies/Results: Dg Chest Port 1 View  Result Date: 11/26/2018 CLINICAL DATA:  Respiratory status. EXAM: PORTABLE CHEST 1 VIEW COMPARISON:  One-view chest x-ray 11/22/2018 FINDINGS: Endotracheal tube is stable at the level of the clavicles. The side port of the NG tube is beyond the GE junction. Bilateral subclavian catheter is at a right IJ line are stable. Right greater than left basilar airspace opacities and small effusions are  again seen. There is slight improved aeration on the left. Surgical clips are present in the right axilla. IMPRESSION: 1. Support apparatus stable. 2. Stable bilateral pleural effusions. 3. Bibasilar airspace opacities likely reflect atelectasis. Aeration is slightly improved on the left. Electronically Signed   By: San Morelle M.D.   On: 11/26/2018 07:42   Dg Chest Port 1 View  Result Date: 11/24/2018 CLINICAL DATA:  Respiratory failure EXAM: PORTABLE CHEST 1 VIEW COMPARISON:  11/24/2018 FINDINGS: Endotracheal tube, gastric catheter and left chest wall port are stable. Right jugular and subclavian central lines are noted as well. No pneumothorax is seen. Bilateral pleural effusions are again noted and stable. No new focal infiltrate is noted. No bony abnormality is seen. IMPRESSION: Tubes  and lines as described. Stable bilateral pleural effusions. Electronically Signed   By: Inez Catalina M.D.   On: 11/29/2018 08:10    Anti-infectives: Anti-infectives (From admission, onward)   Start     Dose/Rate Route Frequency Ordered Stop   11/16/2018 1800  fluconazole (DIFLUCAN) IVPB 200 mg  Status:  Discontinued     200 mg 100 mL/hr over 60 Minutes Intravenous Every 24 hours 12/03/2018 0748 12/02/2018 0847   12/05/2018 1800  fluconazole (DIFLUCAN) IVPB 400 mg  Status:  Discontinued     400 mg 100 mL/hr over 120 Minutes Intravenous Every 24 hours 11/17/2018 0847 12/14/2018 0856   12/05/2018 1400  piperacillin-tazobactam (ZOSYN) IVPB 2.25 g     2.25 g 100 mL/hr over 30 Minutes Intravenous Every 6 hours 12/03/2018 0748     12/03/2018 0600  cefoTEtan (CEFOTAN) 2 g in sodium chloride 0.9 % 100 mL IVPB     2 g 200 mL/hr over 30 Minutes Intravenous On call to O.R. 11/22/18 1300 12/09/2018 0624   11/22/18 1800  fluconazole (DIFLUCAN) IVPB 400 mg  Status:  Discontinued     400 mg 100 mL/hr over 120 Minutes Intravenous Every 24 hours 12/01/2018 1528 12/06/2018 0748   11/22/18 1800  vancomycin (VANCOCIN) 1,500 mg in sodium chloride 0.9 % 500 mL IVPB  Status:  Discontinued     1,500 mg 250 mL/hr over 120 Minutes Intravenous Every 24 hours 11/22/18 0358 11/22/18 0750   11/22/18 0115  vancomycin (VANCOCIN) 1,500 mg in sodium chloride 0.9 % 500 mL IVPB     1,500 mg 250 mL/hr over 120 Minutes Intravenous  Once 11/22/18 0101 11/22/18 0319   11/20/2018 1630  fluconazole (DIFLUCAN) IVPB 800 mg     800 mg 100 mL/hr over 240 Minutes Intravenous  Once 11/26/2018 1527 11/24/2018 2013   12/02/2018 1600  piperacillin-tazobactam (ZOSYN) IVPB 3.375 g  Status:  Discontinued     3.375 g 12.5 mL/hr over 240 Minutes Intravenous Every 8 hours 11/19/2018 1522 11/18/2018 0748   11/24/2018 0545  piperacillin-tazobactam (ZOSYN) IVPB 3.375 g     3.375 g 100 mL/hr over 30 Minutes Intravenous  Once 11/22/2018 0530 12/12/2018 0626   12/14/2018 0530   piperacillin-tazobactam (ZOSYN) IVPB 4.5 g  Status:  Discontinued     4.5 g 200 mL/hr over 30 Minutes Intravenous  Once 12/05/2018 0517 11/19/2018 0529       Assessment/Plan HTN HLD DM Hypothyroidism H/o right inflammatory breast cancer s/p mastectomy and chemo/radiation ARF - on CRRT day #4, Cr1.52, no UOP Hypocalcemia - replaced Thrombocytopenia Septic shock - on 2 pressors and full vent support.   Appreciate CCM assistance Intubated - not able to communicate with her because of sedation/intubation.  Have had discussions with brother.  Perforated small  bowel from closed-loop obstruction 1. S/pEXPLORATORY LAPAROTOMY,SMALL BOWEL RESECTION,LYSIS OF ADHESIONS (1.5 HOURS),PLACEMENT OF WOUND VAC6/7 Dr. Ninfa Linden 2. S/p Reexploration of recent laparotomy WITH [place] OUT (N/A) 6/9 Dr. Rosendo Gros 3. S/p Exploratory laparotomy with washout of abdomen and placement of vacuum VAC dressing 6/11 Dr. Brantley Stage  -"Given the amount of edema and the fact that she was on 3 pressors I did not feel anastomosis safe and pulling the [place] ostomy would be too proximal and she would have short gut syndrome and significant fluid issues"  -Plan reexploration and hopeful fascial closure 6/13  I called and updated the patient's brother.   ID -zosyn 6/7>>, diflucan 6/7>>6/11, vancomycin 6/8>>6/8 FEN -IVF, NPO/NGT. Holding on TPN due to shock liver VTE -SCDs, lovenox on hold for thrombocytopenia Foley -in place Contact - brother [name] [phone] or [phone])   LOS: 5 days    Wellington Hampshire , [facility] 11/26/2018, 7:52 AM Pager: [phone]  Agree with above. Difficult problem in a very sick patient. More "stable".  On two pressors.  Appears to be ventilating okay. Plan return to OR tomorrow to re-establish GI continuity and close abdomen. Jerene Pitch has spoken with brother and we have been through attempted surgery several times.   Alphonsa Overall, MD, [facility] Pager: [phone] Office phone:  [phone]

## 2018-11-26 NOTE — Progress Notes (Signed)
[facility] Progress Note  Subjective: I/O even, wt's down slightly. Went to OR yest , decided not to anastamose the bowel just yet given amount of edema and pressor support  Vitals:   11/26/18 0300 11/26/18 0400 11/26/18 0500 11/26/18 0600  BP:      Pulse: 77 73 77 75  Resp: (!) 29 (!) 32 (!) 32 (!) 32  Temp: 98.6 F (37 C) 98.1 F (36.7 C) 97.7 F (36.5 C) (!) 97.3 F (36.3 C)  TempSrc: Bladder Bladder Bladder Bladder  SpO2: 100% 100% 100% 100%  Weight: 88.3 kg     Height:        Inpatient medications: . sodium chloride   Intravenous Once  . chlorhexidine gluconate (MEDLINE KIT)  15 mL Mouth Rinse BID  . Chlorhexidine Gluconate Cloth  6 each Topical Daily  . dorzolamide-timolol  1 drop Both Eyes BID  . hydrocortisone sod succinate (SOLU-CORTEF) inj  50 mg Intravenous Q6H  . insulin aspart  3-9 Units Subcutaneous Q4H  . mouth rinse  15 mL Mouth Rinse 10 times per day  . pantoprazole (PROTONIX) IV  40 mg Intravenous Q24H   .  prismasol BGK 4/2.5 400 mL/hr at 11/26/18 0114  .  prismasol BGK 4/2.5 200 mL/hr at 12/12/2018 0950  . sodium chloride Stopped (11/18/2018 2149)  . calcium gluconate 1,000 mg (11/26/18 5409)  . fentaNYL infusion INTRAVENOUS 50 mcg/hr (11/26/18 0600)  . norepinephrine (LEVOPHED) Adult infusion 17 mcg/min (11/26/18 0600)  . phenylephrine (NEO-SYNEPHRINE) Adult infusion Stopped (11/29/2018 1615)  . piperacillin-tazobactam (ZOSYN)  IV Stopped (11/26/18 0149)  . prismasol BGK 4/2.5 1,750 mL/hr at 11/26/18 0436  .  sodium bicarbonate  infusion 1000 mL 75 mL/hr at 11/26/18 0600  . vasopressin (PITRESSIN) infusion - *FOR SHOCK* 0.03 Units/min (11/26/18 0600)   sodium chloride, acetaminophen, albuterol, alteplase, [DISCONTINUED] diphenhydrAMINE **OR** diphenhydrAMINE, fentaNYL, heparin, sodium chloride, sodium chloride flush    Exam: Gen on vent, sedated, ETT  No jvd or bruits Chest clear bilat ant and lat RRR no MRG Abd soft ntnd no mass or  ascites +bs, large midline wound vac  GU foley cath minimal urine Ext diffuse LE/ UE 2+ edema Neuro is sedated on the vent   Home meds:  - insulin glargine 10u/ metformin 500 bid/ novolog 70/30 20u bid ac  - atorvastatin 10/ levothyroxine 25 ug/ pravastatin 10   UA neg on 6/7  UNa 35,  UCr 34  CXR 6/9 > bilat effusions and bilat atx  Assessment: 1. Acute renal failure - in setting of septic shock due to perf bowel, suspected ATN. CRRT d#4 today. On 2 pressors. Not making urine. Keeping even.  2. Perforated small bowel / septic shock- per CCM/ gen surg 3. Thrombocytopenia - significant, prob d/t sepsis 4. DM on insulin 5. Vol overload - w/ bilat effusions on CXR, + diffuse edema on exam 15kg+   [facility] 11/26/2018, 7:17 AM  Iron/TIBC/Ferritin/ %Sat No results found for: IRON, TIBC, FERRITIN, IRONPCTSAT Recent Labs  Lab 12/08/2018 0327  11/26/18 0500  NA 139   < > 138  K 4.3   < > 4.1  CL 105   < > 101  CO2 17*   < > 23  GLUCOSE 171*   < > 159*  BUN 27*   < > 22  CREATININE 2.09*   < > 1.52*  CALCIUM 6.2*   < > 6.4*  PHOS 3.4   < > 1.9*  ALBUMIN 1.5*   < >  1.4*  1.4*  INR 1.6*  --   --    < > = values in this interval not displayed.   Recent Labs  Lab 11/26/18 0500  AST 1,120*  ALT 1,487*  ALKPHOS 549*  BILITOT 3.1*  PROT 4.2*   Recent Labs  Lab 11/20/2018 0730  WBC 19.8*  HGB 11.1*  HCT 32.4*  PLT 25*

## 2018-11-26 NOTE — Progress Notes (Signed)
ABG noted  Bicarb discontinued

## 2018-11-26 NOTE — Progress Notes (Signed)
Pharmacy Antibiotic Note  Carmen Stone is a 63 y.o. female admitted on 12/06/2018 with perforated loop of SB.  Pharmacy had been consulted for zosyn and fluconazole dosing. 6/7: perforated small bowel from closed-loop obstruction. S/p exp lap. SBR, LOA, placement of wound VAC.   Today, 11/26/18  Day 6 Zosyn, Diflucan stopped 6/11 due to LFT rise  Afebrile  WBC 19.8 on 6/11 - on IV steroids  Continues on CRRT  Plan:  Change Zosyn to 2.25g IV q6 - this remains appropriate dose for CRRT  F/u renal fxn, clinical course  Height: 5\' 4"  (162.6 cm) Weight: 194 lb 10.7 oz (88.3 kg) IBW/kg (Calculated) : 54.7  Temp (24hrs), Avg:97.1 F (36.2 C), Min:94.6 F (34.8 C), Max:99.1 F (37.3 C)  Recent Labs  Lab 11/20/2018 0410  12/07/2018 1945 11/24/18 0313 11/24/18 1548 11/24/18 2316 12/14/2018 0327 11/30/2018 0730 12/05/2018 1748 11/26/18 0500  WBC 13.1*  --  10.1 10.2  --  13.7*  --  19.8*  --   --   CREATININE 3.16*   < > 3.29* 2.65* 2.33*  --  2.09*  --  1.72* 1.52*  LATICACIDVEN  --   --  9.5*  --   --   --   --   --   --   --    < > = values in this interval not displayed.    Estimated Creatinine Clearance: 40.2 mL/min (A) (by C-G formula based on SCr of 1.52 mg/dL (H)).    No Known Allergies  Antimicrobials this admission: 6/7 zosyn> 6/7 fluconazole> 6/11  Microbiology results: 6/7 Covid 19 negative  Thank you for allowing pharmacy to be a part of this patient's care.  Adrian Saran, PharmD, BCPS 11/26/2018 7:25 AM

## 2018-11-26 NOTE — Progress Notes (Signed)
NAME:  Carmen Stone, MRN:  [id number], DOB:  [birth year], LOS: 5 ADMISSION DATE:  12/14/2018, CONSULTATION DATE:  11/21/2018 REFERRING MD:  Dr. Ninfa Linden, Surgery, CHIEF COMPLAINT:  Abdominal pain   Brief History   63 yo female presented to ER with nausea, vomiting, abdominal pain.  CT abd/pelvis showed focal perforated loop of SB with free air with multiple complex ventral hernias.  Had emergent laparotomy with SB resection, lysis of adhesions and wound vac.  Remained on vent and pressors post op. 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 Weaning pressors, tolerated  OR well 6/11 For OR 6/13  Objective   Blood pressure (!) 101/57, pulse 76, temperature 97.7 F (36.5 C), temperature source Oral, resp. rate (!) 32, height 5\' 4"  (1.626 m), weight 88.3 kg, SpO2 99 %. CVP:  [4 mmHg-19 mmHg] 19 mmHg  Vent Mode: PRVC FiO2 (%):  [30 %] 30 % Set Rate:  [32 bmp] 32 bmp Vt Set:  [430 mL] 430 mL PEEP:  [5 cmH20] 5 cmH20 Plateau Pressure:  [19 cmH20-28 cmH20] 25 cmH20   Intake/Output Summary (Last 24 hours) at 11/26/2018 0841 Last data filed at 11/26/2018 0800 Gross per 24 hour  Intake 3874.05 ml  Output 3690 ml  Net 184.05 ml    Filed Weights   11/24/18 0305 11/19/2018 0500 11/26/18 0300  Weight: 92.3 kg 90.6 kg 88.3 kg    Examination:  Sedated, opens eyes to name call Pupils pinpoint, reactive Endotracheal tube in place S1-S2 appreciated Fair air entry bilaterally, no rales Wound VAC in place 1+ edema Follows instructions commands   Chest x-ray reveals atelectasis, bilateral small effusion-reviewed by myself -slight improvement   Resolved Hospital Problem list     Assessment & Plan:   Acute hypoxic respiratory failure in setting of peritonitis -Continue vent support -Follow blood gases, follow radiological changes  Sepsis from peritonitis -On pressors-being weaned -Maintain mean arterial pressure greater than 65 -Day 6 of Zosyn, Diflucan discontinued 6/11 secondary to elevated LFTs  Acute systolic congestive heart failure with ejection fraction of 20%, 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 Sobel cardiomyopathy -Cardiology following -hold lisinopril, Pravachol  Acute kidney injury from acute tubular necrosis, baseline creatinine of 0.73 from 09/17/9673 Metabolic acidosis Hypocalcemia, hypomagnesemia -Continue CRRT -Trend BMET  Status post small bowel resection, lysis of adhesions, wound VAC in place -for OR 11/22/2018  Type 2 diabetes -Continue to hold metformin -SSI  Acute metabolic encephalopathy -Stabilizing  Thrombocytopenia from sepsis -Continue to hold Lovenox -Trend CBC -No evidence of bleeding  Shock liver -Trend LFTs  Respiratory alkalosis -Adjust vent -Follow ABG  Discussed feeding with surgery Will hold TPN today, follow LFTs Left is appear to be improving-likely related to  shock liver -May consider TPN 11/26/2018 if LFTs continue to improve  Best practice:  Diet: Remain n.p.o. DVT prophylaxis: SCDs GI prophylaxis: Protonix Mobility: Bed rest Code Status: Full code Disposition: ICU  Labs    CMP Latest Ref Rng & Units 11/26/2018 12/02/2018 12/12/2018  Glucose 70 - 99 mg/dL  159(H) 179(H) 171(H)  BUN 8 - 23 mg/dL 22 24(H) 27(H)  Creatinine 0.44 - 1.00 mg/dL 1.52(H) 1.72(H) 2.09(H)  Sodium 135 - 145 mmol/L 138 137 139  Potassium 3.5 - 5.1 mmol/L 4.1 4.1 4.3  Chloride 98 - 111 mmol/L 101 103 105  CO2 22 - 32 mmol/L 23 21(L) 17(L)  Calcium 8.9 - 10.3 mg/dL 6.4(LL) 6.1(LL) 6.2(LL)  Total Protein 6.5 - 8.1 g/dL 4.2(L) - 4.3(L)  Total Bilirubin 0.3 - 1.2 mg/dL 3.1(H) - 2.3(H)  Alkaline Phos 38 - 126 U/L 549(H) - 310(H)  AST 15 - 41 U/L 1,120(H) - 2,152(H)  ALT 0 - 44 U/L 1,487(H) - 2,073(H)   CBC Latest Ref Rng & Units 12/05/2018 11/24/2018 11/24/2018  WBC 4.0 - 10.5 K/uL 19.8(H) 13.7(H) 10.2  Hemoglobin 12.0 - 15.0 g/dL 11.1(L) 11.5(L) 12.5  Hematocrit 36.0 - 46.0 % 32.4(L) 34.0(L) 39.2  Platelets 150 - 400 K/uL 25(LL) 28(LL) 43(L)   ABG    Component Value Date/Time   PHART 7.566 (H) 11/26/2018 0415   PCO2ART 25.7 (L) 11/26/2018 0415   PO2ART 84.5 11/26/2018 0415   HCO3 23.4 11/26/2018 0415   TCO2 16 (L) 11/18/2018 1658   ACIDBASEDEF 5.8 (H) 12/12/2018 0307   O2SAT 96.8 11/26/2018 0415   CBG (last 3)  Recent Labs    12/03/2018 2337 11/26/18 0328 11/26/18 0823  GLUCAP 145* 150* 153*   The patient is critically ill with multiple organ system failure and requires high complexity decision making for assessment and support, frequent evaluation and titration of therapies, advanced monitoring, review of radiographic studies and interpretation of complex data.    Critical Care Time devoted to patient care services, exclusive of separately billable procedures, described in this note is 31 minutes.  Sherrilyn Rist, MD [place], PCCM Cell: [phone]

## 2018-11-27 ENCOUNTER — Inpatient Hospital Stay (HOSPITAL_COMMUNITY): Payer: BLUE CROSS/BLUE SHIELD

## 2018-11-27 ENCOUNTER — Encounter (HOSPITAL_COMMUNITY): Admission: EM | Disposition: E | Payer: Self-pay | Source: Home / Self Care

## 2018-11-27 LAB — CBC WITH DIFFERENTIAL/PLATELET
Band Neutrophils: 7 %
Basophils Absolute: 0 10*3/uL (ref 0.0–0.1)
Basophils Relative: 0 %
Blasts: 0 %
Eosinophils Absolute: 0 10*3/uL (ref 0.0–0.5)
Eosinophils Relative: 0 %
HCT: 30.6 % — ABNORMAL LOW (ref 36.0–46.0)
Hemoglobin: 9.9 g/dL — ABNORMAL LOW (ref 12.0–15.0)
Lymphocytes Relative: 6 %
Lymphs Abs: 1.4 10*3/uL (ref 0.7–4.0)
MCH: 29.5 pg (ref 26.0–34.0)
MCHC: 32.4 g/dL (ref 30.0–36.0)
MCV: 91.1 fL (ref 80.0–100.0)
Metamyelocytes Relative: 2 %
Monocytes Absolute: 0 10*3/uL — ABNORMAL LOW (ref 0.1–1.0)
Monocytes Relative: 0 %
Myelocytes: 0 %
Neutro Abs: 22.6 10*3/uL — ABNORMAL HIGH (ref 1.7–7.7)
Neutrophils Relative %: 85 %
Other: 0 %
Platelets: 13 10*3/uL — CL (ref 150–400)
Promyelocytes Relative: 0 %
RBC: 3.36 MIL/uL — ABNORMAL LOW (ref 3.87–5.11)
RDW: 15 % (ref 11.5–15.5)
WBC: 24 10*3/uL — ABNORMAL HIGH (ref 4.0–10.5)
nRBC: 1.9 % — ABNORMAL HIGH (ref 0.0–0.2)
nRBC: 3 /100 WBC — ABNORMAL HIGH

## 2018-11-27 LAB — RENAL FUNCTION PANEL
Albumin: 1.4 g/dL — ABNORMAL LOW (ref 3.5–5.0)
Albumin: 1.4 g/dL — ABNORMAL LOW (ref 3.5–5.0)
Anion gap: 9 (ref 5–15)
Anion gap: 9 (ref 5–15)
BUN: 28 mg/dL — ABNORMAL HIGH (ref 8–23)
BUN: 42 mg/dL — ABNORMAL HIGH (ref 8–23)
CO2: 24 mmol/L (ref 22–32)
CO2: 24 mmol/L (ref 22–32)
Calcium: 7.2 mg/dL — ABNORMAL LOW (ref 8.9–10.3)
Calcium: 6.7 mg/dL — ABNORMAL LOW (ref 8.9–10.3)
Chloride: 105 mmol/L (ref 98–111)
Chloride: 105 mmol/L (ref 98–111)
Creatinine, Ser: 1.64 mg/dL — ABNORMAL HIGH (ref 0.44–1.00)
Creatinine, Ser: 2.2 mg/dL — ABNORMAL HIGH (ref 0.44–1.00)
GFR calc Af Amer: 38 mL/min — ABNORMAL LOW (ref >60)
GFR calc Af Amer: 27 mL/min — ABNORMAL LOW (ref >60)
GFR calc non Af Amer: 33 mL/min — ABNORMAL LOW (ref >60)
GFR calc non Af Amer: 23 mL/min — ABNORMAL LOW (ref >60)
Glucose, Bld: 161 mg/dL — ABNORMAL HIGH (ref 70–99)
Glucose, Bld: 164 mg/dL — ABNORMAL HIGH (ref 70–99)
Phosphorus: 3.8 mg/dL (ref 2.5–4.6)
Phosphorus: 4 mg/dL (ref 2.5–4.6)
Potassium: 4.9 mmol/L (ref 3.5–5.1)
Potassium: 4.6 mmol/L (ref 3.5–5.1)
Sodium: 138 mmol/L (ref 135–145)
Sodium: 138 mmol/L (ref 135–145)

## 2018-11-27 LAB — BLOOD GAS, ARTERIAL
Acid-base deficit: 1.7 mmol/L (ref 0.0–2.0)
Bicarbonate: 23.4 mmol/L (ref 20.0–28.0)
Drawn by: 514251
FIO2: 60
MECHVT: 430 mL
O2 Saturation: 97.9 %
PEEP: 8 cmH2O
Patient temperature: 98.6
RATE: 20 resp/min
pCO2 arterial: 43.3 mmHg (ref 32.0–48.0)
pH, Arterial: 7.351 (ref 7.350–7.450)
pO2, Arterial: 124 mmHg — ABNORMAL HIGH (ref 83.0–108.0)

## 2018-11-27 LAB — GLUCOSE, CAPILLARY
Glucose-Capillary: 119 mg/dL — ABNORMAL HIGH (ref 70–99)
Glucose-Capillary: 120 mg/dL — ABNORMAL HIGH (ref 70–99)
Glucose-Capillary: 130 mg/dL — ABNORMAL HIGH (ref 70–99)
Glucose-Capillary: 136 mg/dL — ABNORMAL HIGH (ref 70–99)
Glucose-Capillary: 145 mg/dL — ABNORMAL HIGH (ref 70–99)
Glucose-Capillary: 147 mg/dL — ABNORMAL HIGH (ref 70–99)

## 2018-11-27 LAB — BPAM PLATELET PHERESIS
Blood Product Expiration Date: 202006112359
Blood Product Expiration Date: 202006122359
Unit Type and Rh: 6200
Unit Type and Rh: 7300

## 2018-11-27 LAB — PREPARE PLATELET PHERESIS
Unit division: 0
Unit division: 0

## 2018-11-27 LAB — CBC
HCT: 26 % — ABNORMAL LOW (ref 36.0–46.0)
Hemoglobin: 8.7 g/dL — ABNORMAL LOW (ref 12.0–15.0)
MCH: 30.1 pg (ref 26.0–34.0)
MCHC: 33.5 g/dL (ref 30.0–36.0)
MCV: 90 fL (ref 80.0–100.0)
Platelets: 34 10*3/uL — ABNORMAL LOW (ref 150–400)
RBC: 2.89 MIL/uL — ABNORMAL LOW (ref 3.87–5.11)
RDW: 15 % (ref 11.5–15.5)
WBC: 27.7 10*3/uL — ABNORMAL HIGH (ref 4.0–10.5)
nRBC: 1.1 % — ABNORMAL HIGH (ref 0.0–0.2)

## 2018-11-27 LAB — HEPATIC FUNCTION PANEL
ALT: 1162 U/L — ABNORMAL HIGH (ref 0–44)
ALT: 928 U/L — ABNORMAL HIGH (ref 0–44)
AST: 662 U/L — ABNORMAL HIGH (ref 15–41)
AST: 445 U/L — ABNORMAL HIGH (ref 15–41)
Albumin: 1.4 g/dL — ABNORMAL LOW (ref 3.5–5.0)
Albumin: 1.3 g/dL — ABNORMAL LOW (ref 3.5–5.0)
Alkaline Phosphatase: 636 U/L — ABNORMAL HIGH (ref 38–126)
Alkaline Phosphatase: 607 U/L — ABNORMAL HIGH (ref 38–126)
Bilirubin, Direct: 2.7 mg/dL — ABNORMAL HIGH (ref 0.0–0.2)
Bilirubin, Direct: 2.8 mg/dL — ABNORMAL HIGH (ref 0.0–0.2)
Indirect Bilirubin: 0.9 mg/dL (ref 0.3–0.9)
Indirect Bilirubin: 1 mg/dL — ABNORMAL HIGH (ref 0.3–0.9)
Total Bilirubin: 3.6 mg/dL — ABNORMAL HIGH (ref 0.3–1.2)
Total Bilirubin: 3.8 mg/dL — ABNORMAL HIGH (ref 0.3–1.2)
Total Protein: 4.5 g/dL — ABNORMAL LOW (ref 6.5–8.1)
Total Protein: 4.2 g/dL — ABNORMAL LOW (ref 6.5–8.1)

## 2018-11-27 LAB — LACTIC ACID, PLASMA
Lactic Acid, Venous: 3.6 mmol/L (ref 0.5–1.9)
Lactic Acid, Venous: 2.7 mmol/L (ref 0.5–1.9)

## 2018-11-27 LAB — BASIC METABOLIC PANEL
Anion gap: 10 (ref 5–15)
BUN: 24 mg/dL — ABNORMAL HIGH (ref 8–23)
CO2: 24 mmol/L (ref 22–32)
Calcium: 7.8 mg/dL — ABNORMAL LOW (ref 8.9–10.3)
Chloride: 105 mmol/L (ref 98–111)
Creatinine, Ser: 1.59 mg/dL — ABNORMAL HIGH (ref 0.44–1.00)
GFR calc Af Amer: 39 mL/min — ABNORMAL LOW (ref >60)
GFR calc non Af Amer: 34 mL/min — ABNORMAL LOW (ref >60)
Glucose, Bld: 158 mg/dL — ABNORMAL HIGH (ref 70–99)
Potassium: 4.6 mmol/L (ref 3.5–5.1)
Sodium: 139 mmol/L (ref 135–145)

## 2018-11-27 LAB — TROPONIN I: Troponin I: 0.23 ng/mL (ref <0.03)

## 2018-11-27 LAB — MAGNESIUM: Magnesium: 2.5 mg/dL — ABNORMAL HIGH (ref 1.7–2.4)

## 2018-11-27 LAB — PHOSPHORUS: Phosphorus: 4.7 mg/dL — ABNORMAL HIGH (ref 2.5–4.6)

## 2018-11-27 SURGERY — LAPAROTOMY, EXPLORATORY
Anesthesia: General

## 2018-11-27 MED ORDER — SODIUM CHLORIDE 0.9% IV SOLUTION
Freq: Once | INTRAVENOUS | Status: AC
  Administered 2018-11-27: 09:00:00 via INTRAVENOUS

## 2018-11-27 MED ORDER — SODIUM CHLORIDE 0.9% IV SOLUTION: Freq: Once | INTRAVENOUS | Status: DC

## 2018-11-27 MED FILL — Medication: Qty: 1 | Status: AC

## 2018-11-27 NOTE — Progress Notes (Signed)
CRITICAL VALUE ALERT  Critical Value:  Troponin 0.23  Lactic acid 3.6  Date & Time Notied:  12/07/2018    Provider Notified: CCM  Orders Received/Actions taken: continue to monitor

## 2018-11-27 NOTE — Progress Notes (Signed)
2 Days Post-Op   Subjective/Chief Complaint: Patient remains intubated.  Her pressor requirements down to 2 pressors from 3.  Last night she had an event of cardiac arrest and PEA that resolved.  Etiology unclear.  She now responds to pain in her dialysis is off.  She was scheduled to go to the operating room for closure today of her abdomen and small bowel anastomosis.  Currently, she appears stable but is going for head CT this morning.  I discussed her care with the nurse at the bedside.  I also called her brother today and talk to him about the condition and explained that at this point time either we withdraw care completely or we proceed to surgery to get her abdominal wall closed since part of the problem could be that she has an open abdomen.  Her last exploration showed no ischemic bowel at this point time.  There is no large collection of pus or other etiology to explain this.  The plan will be for OR as long as her head CT is normal since her pressor requirements decreasing and she does show some neurological signs of responding.  Her sedation is off this point time but I explained to him that we are at a point where we can no longer leave her abdomen open.  Either we closed her for withdrawal care completely.  He would like [place] to proceed as long as we feel there is some possibility recovery which I do feel as long as her head CT does not show a large stroke.   Objective: Vital signs in last 24 hours: Temp:  [96.1 F (35.6 C)-98.6 F (37 C)] 98.6 F (37 C) (06/13 0700) Pulse Rate:  [75-100] 82 (06/13 0700) Resp:  [14-32] 20 (06/13 0700) BP: (94-133)/(57-70) 133/64 (06/12 2108) SpO2:  [58 %-100 %] 100 % (06/13 0700) Arterial Line BP: (90-150)/(50-74) 102/53 (06/13 0700) FiO2 (%):  [30 %-50 %] 50 % (06/13 0348) Weight:  [92.4 kg] 92.4 kg (06/13 0335) Last BM Date: 11/24/18  Intake/Output from previous day: 06/12 0701 - 06/13 0700 In: 1476.3 [I.V.:1104.6; IV Piggyback:371.7] Out: 1343  [Drains:410] Intake/Output this shift: No intake/output data recorded.  Incision/Wound: Wound VAC in place.  Lab Results:  Recent Labs    12/12/2018 0730 11/26/2018 0052  WBC 19.8* 24.0*  HGB 11.1* 9.9*  HCT 32.4* 30.6*  PLT 25* 13*   BMET Recent Labs    11/16/2018 0052 12/05/2018 0333  NA 139 138  K 4.6 4.9  CL 105 105  CO2 24 24  GLUCOSE 158* 161*  BUN 24* 28*  CREATININE 1.59* 1.64*  CALCIUM 7.8* 7.2*   PT/INR Recent Labs    12/06/2018 0327  LABPROT 19.3*  INR 1.6*   ABG Recent Labs    11/26/18 0956 11/24/2018 0052  PHART 7.517* 7.351  HCO3 25.7 23.4    Studies/Results: Dg Chest Port 1 View  Result Date: 11/19/2018 CLINICAL DATA:  Dyspnea EXAM: PORTABLE CHEST 1 VIEW COMPARISON:  11/26/2018 FINDINGS: The endotracheal tube terminates above the carina. The right-sided central venous catheters and the left subclavian port are all stable in positioning. The heart size is stable from prior study. The enteric tube terminates below the left hemidiaphragm. There are growing bilateral pleural effusions with worsening volume overload. There are scattered bilateral airspace opacities. No evidence of a pneumothorax. IMPRESSION: 1. Stable positioning of lines and tubes. 2. No pneumothorax. 3. Enlarging bilateral pleural effusions. 4. Stable cardiac size with persistent volume overload. There are hazy  bilateral airspace opacities favored to represent sequela of pulmonary edema. Electronically Signed   By: Constance Holster M.D.   On: 12/01/2018 01:22   Dg Chest Port 1 View  Result Date: 11/26/2018 CLINICAL DATA:  Respiratory status. EXAM: PORTABLE CHEST 1 VIEW COMPARISON:  One-view chest x-ray 11/29/2018 FINDINGS: Endotracheal tube is stable at the level of the clavicles. The side port of the NG tube is beyond the GE junction. Bilateral subclavian catheter is at a right IJ line are stable. Right greater than left basilar airspace opacities and small effusions are again seen. There is  slight improved aeration on the left. Surgical clips are present in the right axilla. IMPRESSION: 1. Support apparatus stable. 2. Stable bilateral pleural effusions. 3. Bibasilar airspace opacities likely reflect atelectasis. Aeration is slightly improved on the left. Electronically Signed   By: San Morelle M.D.   On: 11/26/2018 07:42    Anti-infectives: Anti-infectives (From admission, onward)   Start     Dose/Rate Route Frequency Ordered Stop   12/03/2018 1800  fluconazole (DIFLUCAN) IVPB 200 mg  Status:  Discontinued     200 mg 100 mL/hr over 60 Minutes Intravenous Every 24 hours 11/19/2018 0748 12/14/2018 0847   11/30/2018 1800  fluconazole (DIFLUCAN) IVPB 400 mg  Status:  Discontinued     400 mg 100 mL/hr over 120 Minutes Intravenous Every 24 hours 11/30/2018 0847 11/20/2018 0856   12/02/2018 1400  piperacillin-tazobactam (ZOSYN) IVPB 2.25 g     2.25 g 100 mL/hr over 30 Minutes Intravenous Every 6 hours 12/07/2018 0748     11/30/2018 0600  cefoTEtan (CEFOTAN) 2 g in sodium chloride 0.9 % 100 mL IVPB     2 g 200 mL/hr over 30 Minutes Intravenous On call to O.R. 11/22/18 1300 12/13/2018 0624   11/22/18 1800  fluconazole (DIFLUCAN) IVPB 400 mg  Status:  Discontinued     400 mg 100 mL/hr over 120 Minutes Intravenous Every 24 hours 12/12/2018 1528 11/22/2018 0748   11/22/18 1800  vancomycin (VANCOCIN) 1,500 mg in sodium chloride 0.9 % 500 mL IVPB  Status:  Discontinued     1,500 mg 250 mL/hr over 120 Minutes Intravenous Every 24 hours 11/22/18 0358 11/22/18 0750   11/22/18 0115  vancomycin (VANCOCIN) 1,500 mg in sodium chloride 0.9 % 500 mL IVPB     1,500 mg 250 mL/hr over 120 Minutes Intravenous  Once 11/22/18 0101 11/22/18 0319   12/07/2018 1630  fluconazole (DIFLUCAN) IVPB 800 mg     800 mg 100 mL/hr over 240 Minutes Intravenous  Once 11/20/2018 1527 12/01/2018 2013   11/29/2018 1600  piperacillin-tazobactam (ZOSYN) IVPB 3.375 g  Status:  Discontinued     3.375 g 12.5 mL/hr over 240 Minutes Intravenous  Every 8 hours 12/01/2018 1522 11/29/2018 0748   11/24/2018 0545  piperacillin-tazobactam (ZOSYN) IVPB 3.375 g     3.375 g 100 mL/hr over 30 Minutes Intravenous  Once 12/09/2018 0530 12/09/2018 0626   11/26/2018 0530  piperacillin-tazobactam (ZOSYN) IVPB 4.5 g  Status:  Discontinued     4.5 g 200 mL/hr over 30 Minutes Intravenous  Once 12/03/2018 0517 11/24/2018 0529      Assessment/Plan: s/p Procedure(s): EXPLORATORY LAPAROTOMY WITH REAPPLICATION OF WOUND VAC  (N/A) See above  Plan is to try to take her to the OR today to close her if able.  If not, may need to bring back tomorrow.  As long as she has no further events, I would continue to try to support her and bring her  along since she was showing signs of being awake and alert yesterday prior to her event that happened last night which is unclear the etiology.  This may have been a mucous plug because transient hypoxemia and subsequent PEA but this resolved fairly quickly.  Brother aware of her tenuous condition and the fact that she may not survive any of this.  LOS: 6 days    Joyice Faster [place] 11/20/2018

## 2018-11-27 NOTE — Anesthesia Preprocedure Evaluation (Signed)
Anesthesia Evaluation  Patient identified by MRN, date of birth, ID band Patient confused    Reviewed: Allergy & Precautions, NPO status , Patient's Chart, lab work & pertinent test results, Unable to perform ROS - Chart review onlyPreop documentation limited or incomplete due to emergent nature of procedure.  Airway Mallampati: Intubated       Dental  (+) Dental Advisory Given, Edentulous Upper, Edentulous Lower   Pulmonary neg pulmonary ROS,    Pulmonary exam normal breath sounds clear to auscultation       Cardiovascular hypertension, Pt. on medications  Rhythm:Regular Rate:Tachycardia     Neuro/Psych negative neurological ROS  negative psych ROS   GI/Hepatic Neg liver ROS, ruptured bowel She has perforated small bowel with a large amount of free air as well as multiple incarcerated incisional hernias.    Endo/Other  diabetes, Type 2, Insulin Dependent, Oral Hypoglycemic AgentsCBG 495  Renal/GU Renal disease     Musculoskeletal negative musculoskeletal ROS (+)   Abdominal   Peds  Hematology negative hematology ROS (+)   Anesthesia Other Findings   Reproductive/Obstetrics                             Anesthesia Physical  Anesthesia Plan  ASA: IV  Anesthesia Plan: General   Post-op Pain Management:    Induction: Intravenous and Rapid sequence  PONV Risk Score and Plan: 4 or greater and Ondansetron, Treatment may vary due to age or medical condition and Propofol infusion  Airway Management Planned: Oral ETT  Additional Equipment: Arterial line and CVP  Intra-op Plan:   Post-operative Plan: Possible Post-op intubation/ventilation  Informed Consent: I have reviewed the patients History and Physical, chart, labs and discussed the procedure including the risks, benefits and alternatives for the proposed anesthesia with the patient or authorized representative who has indicated  his/her understanding and acceptance.     Dental advisory given, Only emergency history available and History available from chart only  Plan Discussed with: CRNA, Anesthesiologist and Surgeon  Anesthesia Plan Comments:         Anesthesia Quick Evaluation

## 2018-11-27 NOTE — Progress Notes (Addendum)
CT head results noted  More spontaneous movement noted  She will be going to the OR for closure  Will continue to monitor closely  Did check on the patient, responds to voice, opens eyes to commands

## 2018-11-27 NOTE — Progress Notes (Signed)
VAST RN responded to a medical alert code blue. Patient has sufficient functional vascular access points.

## 2018-11-27 NOTE — Progress Notes (Signed)
[facility] Progress Note  Subjective: I/O even yest, unfortunately had cardiac arrest last night, taken off CRRT.  Labs ok this am.   Vitals:   11/22/2018 0400 11/30/2018 0500 11/18/2018 0600 11/15/2018 0700  BP:      Pulse: 75 79 82 82  Resp: '20 20 20 20  ' Temp: 97.7 F (36.5 C) 98.1 F (36.7 C) 98.4 F (36.9 C) 98.6 F (37 C)  TempSrc:      SpO2: 100% 100% 100% 100%  Weight:      Height:        Inpatient medications: . sodium chloride   Intravenous Once  . chlorhexidine gluconate (MEDLINE KIT)  15 mL Mouth Rinse BID  . Chlorhexidine Gluconate Cloth  6 each Topical Daily  . dorzolamide-timolol  1 drop Both Eyes BID  . hydrocortisone sod succinate (SOLU-CORTEF) inj  50 mg Intravenous Q6H  . insulin aspart  3-9 Units Subcutaneous Q4H  . mouth rinse  15 mL Mouth Rinse 10 times per day  . pantoprazole (PROTONIX) IV  40 mg Intravenous Q24H   .  prismasol BGK 4/2.5 400 mL/hr at 11/26/18 1407  .  prismasol BGK 4/2.5 200 mL/hr at 11/26/18 1415  . sodium chloride 10 mL/hr at 12/09/2018 0600  . fentaNYL infusion INTRAVENOUS Stopped (12/09/2018 0044)  . norepinephrine (LEVOPHED) Adult infusion 10 mcg/min (11/15/2018 0600)  . phenylephrine (NEO-SYNEPHRINE) Adult infusion Stopped (11/22/2018 1615)  . piperacillin-tazobactam (ZOSYN)  IV Stopped (12/01/2018 0349)  . prismasol BGK 4/2.5 1,750 mL/hr at 11/26/18 2201  . vasopressin (PITRESSIN) infusion - *FOR SHOCK* 0.03 Units/min (12/09/2018 0600)   sodium chloride, acetaminophen, albuterol, alteplase, [DISCONTINUED] diphenhydrAMINE **OR** diphenhydrAMINE, fentaNYL, heparin, sodium chloride, sodium chloride flush    Exam: Gen on vent, sedated, ETT  No jvd or bruits Chest clear bilat ant and lat RRR no MRG Abd soft ntnd no mass or ascites +bs, large midline wound vac  GU foley cath minimal urine Ext diffuse LE/ UE 2+ edema Neuro is sedated on the vent   Home meds:  - insulin glargine 10u/ metformin 500 bid/ novolog 70/30 20u bid  ac  - atorvastatin 10/ levothyroxine 25 ug/ pravastatin 10   UA neg on 6/7  UNa 35,  UCr 34  CXR 6/9 > bilat effusions and bilat atx  Assessment: 1. Acute renal failure - in setting of septic shock due to perf bowel, suspected ATN. SP CRRT x 4 days. Held last night d/t arrest.  Can continue to hold for now, is well-dialyzed. Will follow.   2. Perforated small bowel due to closed-loop SBO / septic shock- per CCM/ gen surg 3. Thrombocytopenia - significant 4. DM on insulin 5. Vol overload - + diffuse edema on exam 15kg+   [facility] 12/11/2018, 8:11 AM  Iron/TIBC/Ferritin/ %Sat No results found for: IRON, TIBC, FERRITIN, IRONPCTSAT Recent Labs  Lab 12/05/2018 0327  12/04/2018 0333  NA 139   < > 138  K 4.3   < > 4.9  CL 105   < > 105  CO2 17*   < > 24  GLUCOSE 171*   < > 161*  BUN 27*   < > 28*  CREATININE 2.09*   < > 1.64*  CALCIUM 6.2*   < > 7.2*  PHOS 3.4   < > 3.8  ALBUMIN 1.5*   < > 1.4*  INR 1.6*  --   --    < > = values in this interval not displayed.   Recent  Labs  Lab 12/03/2018 0052  AST 662*  ALT 1,162*  ALKPHOS 636*  BILITOT 3.6*  PROT 4.5*   Recent Labs  Lab 11/20/2018 0052  WBC 24.0*  HGB 9.9*  HCT 30.6*  PLT 13*

## 2018-11-27 NOTE — Progress Notes (Signed)
Approximately at 32, RN noted that patient appeared very agitated and was biting ET tube, which was causing her oxygen saturations to decrease. Patient's SBP >200's, and her eyes were open. Vasopressin stopped and Levophed decreased. RN gave Fentanyl bolus in attempt to calm patient, but this helped minimally. RN then called Deterding, MD for medication to aid with agitation and 2 mg of Versed was ordered. Versed given and patient tolerated it well. Stopped biting on the ET tube. BP slowly decreased, but pt's oxygen saturations never recovered and continued to drop. RN called respiratory therapist and charge RN to come and assess patient. When charge RN arrived, she made the decision to call a code. Tele showed HR, but patient was in PEA. Doppler revealed no pulse. CPR started at [place]. Deterding ordered to hold CRRT. Patient now stable. Vasopressin resumed, and Fentanyl currently off. Family was called and are aware of the event. Will continue to monitor patient.  Prior to event patient had been calm and responded to stimulation (ex: moving mouth/head and biting swab during mouth washes).  Oswald Hillock, MD advised RN to keep Fentanyl off, and give patient time to wake up. If Patient does not wake up within 4-5 hours then a head CT will be ordered

## 2018-11-27 NOTE — Progress Notes (Signed)
[facility] Progress Note Patient Name: LATRICA CLOWERS DOB: [birth year] MRN: [id number]   Date of Service  11/18/2018  HPI/Events of Note  Patient experienced a code event temporally associated with agitation.  Was sedated with fentanyl gtt with nurses asking for additonal doses of versed.  2 mg IV ordered but during that time the patient began to bite down on the ETT with increase in BP/HR but decease in sats to the 50s.  Then patient began to brady down with brief loss of pulse.  Given 1 amp of epi and compressions initiated.  Taken off vent and CRRT during code.  Also given 1 amp of calcium for potassium of 5.0.  Had ROSC with initial hypertension and tachycardia.  Now with HR of 92, sats of 100% and BP of 104/51 (69).    eICU Interventions  Plan: Called [facility]CS and informed them of code event Patient to remain off CRRT for now until evaluated at bedside  PCCM to bedside to evaluate. Continue with sedation and pressor support as needed Bite block     Intervention Category Major Interventions: Code management / supervision  [place] 11/24/2018, 12:23 AM

## 2018-11-27 NOTE — Progress Notes (Signed)
Evaluated Ms. Yeager at bedside, status post cardiac arrest   63 yo female admitted with  perforated loop of SB with free air with multiple complex ventral hernias.  She is status post emergent laparotomy with SB resection, lysis of adhesions and wound vac.  Remained on vent and pressors post op.Was in surgery 6/11 2020, plan for OR 11/26/2018  Tonight patient developed agitation with elevated blood pressure with SBP in the 200's and biting down on tube. Desatted and lost pulse. ACLS initiated. CPR initiated. Received 1 epi, 1gm of calcium. ROSC achieved after 6 minutes. Initial rhythm was PEA.   On my evaluation: BP 133/64   Pulse 75   Temp 97.7 F (36.5 C)   Resp 20   Ht 5\' 4"  (1.626 m)   Wt 88.3 kg   SpO2 98%   BMI 33.41 kg/m  patient is hypotensive on 72mcg of norepinephrine, completely unresponsive, GCS 3T on 17mcg of Fentanyl, O2 sats 100% on 100FIO2.  Chest clear to auscultation Abdomen open, wound vac in place, well attached Has edema of both upper and lower extremities  A/P Patient who is status post cardiac arrest. Suspect likely respiratory arrest in the setting of agitation leading to cardiac arrest. DDx include cardiac etiology. She had an echo on 6/8 that showed EF of 20% and findings concerning for Takotsubo's cardiomyopathy Patient is unresponsive post code which could be due to fentanyl or possibly a neurological event (ischemic or hemorrhagic stroke) vs hypoxic injury post cardiac arrest.  -Obtain labs-CBC, LFT, BMP, Mg, PO4, troponin, lactic acid -Obtain stat ABG-Decrease FIO2 as O2 sats are at 100% (7.35/43.3/124/23.4) -Obtain stat CXR -Add vasopressin back and titrate levophed to MAP of >43mmhg -Discontinue Fentanyl and monitor neuro status. If neuro status does not improve after a few hours off fentanyl will obtain stat CT head to evaluate further -Stat EKG-NSR, low voltage -Continue holding CRRT for now until we have labs back  Critical Care time: 23  Minutes  Oswald Hillock, MD PCCM

## 2018-11-27 NOTE — Progress Notes (Signed)
NAME:  Carmen Stone, MRN:  [id number], DOB:  [birth year], LOS: 6 ADMISSION DATE:  12/12/2018, CONSULTATION DATE:  11/21/2018 REFERRING MD:  Dr. Ninfa Linden, Surgery, CHIEF COMPLAINT:  Abdominal pain   Brief History   62 yo female presented to ER with nausea, vomiting, abdominal pain.  CT abd/pelvis showed focal perforated loop of SB with free air with multiple complex ventral hernias.  Had emergent laparotomy with SB resection, lysis of adhesions and wound vac.  Remained on vent and pressors post op. Was in surgery 6/11 2020, plan for OR 11/26/2018 Cardiorespiratory arrest 12/09/2018-lasted a couple of minutes  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, HTN, HLD, DM type II  Significant Hospital Events   6/07 Admit, to OR 6/08 transfuse FFP, on multiple pressors 6/09 To OR for wash out, start CRRT 6/12-still on CRRT, no urine output 6/13-acute respiratory arrest overnight, off CRRT, on pressors, limit sedation  Consults:  Renal 6/09 AKI, acidosis Cardiology 6/09 Takotsubo CM   Procedures:  ETT 6/07 >> Rt IJ HD 6/09 >> Rt [place] CVL 6/09 >> Lt radial aline 6/09 >>  Significant Diagnostic Tests:  CT abd/pelvis 6/07 >> focal perforated loop of SB with free air with multiple complex ventral hernias Echo 6/08 >> EF 20%, Takotsubo CM  Micro Data:  COVID 6/07 >> negative  Antimicrobials:  Zosyn 6/07 >> Diflucan 6/07 -6/10  Interim history/subjective:  Cardiorespiratory arrest overnight On pressors, off sedation 4 OR today following CT-ordered for this morning Overnight events discussed with patient's nurse  Objective   Blood pressure 133/64, pulse 82, temperature 98.6 F (37 C), resp. rate 20, height 5\' 4"  (1.626 m), weight 92.4 kg, SpO2 100 %. CVP:  [9 mmHg-19 mmHg] 12 mmHg  Vent Mode: PRVC FiO2 (%):  [30 %-50 %] 50 % Set Rate:  [20 bmp] 20 bmp Vt Set:  [430 mL] 430 mL PEEP:  [5 cmH20-8 cmH20] 8 cmH20 Plateau Pressure:  [20 cmH20-22 cmH20]  20 cmH20   Intake/Output Summary (Last 24 hours) at 11/22/2018 0732 Last data filed at 11/22/2018 0600 Gross per 24 hour  Intake 1476.3 ml  Output 1343 ml  Net 133.3 ml   Filed Weights   11/29/2018 0500 11/26/18 0300 11/24/2018 0335  Weight: 90.6 kg 88.3 kg 92.4 kg    Examination: Off sedation, minimal response to name calling Pupils pinpoint, reactive Endotracheal tube in place S1-S2 appreciated Fair air entry bilaterally, some rhonchi Wound VAC in place 1+ edema  Chest x-ray reviewed by myself showing bilateral pleural effusions   Resolved Hospital Problem list     Assessment & Plan:   Cardiorespiratory arrest 12/08/2018 -Acute agitation, severe hypertension that did not respond to sedation and pain management -Did receive fentanyl and Versed as she was biting on the tube -She did bradycardia down with loss of a pulse and had to be resuscitated  -She is minimally responsive this morning -Plan is to keep off sedation -Obtain a CT scan of the head to rule out an inciting event for overnight decompensation  Acute hypoxic respiratory failure in setting of peritonitis -Continue ventilator support -Follow arterial blood gases, follow radiological changes-chest x-ray reviewed  Sepsis from peritonitis -Remains on pressors-being weaned -Maintain an M AP of greater than 65 -Day 7 of Zosyn, Diflucan discontinued 6/10 secondary to elevated LFTs  Acute systolic congestive heart failure with ejection fraction of 20%, 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 -Cardiology following -Hold lisinopril, Pravachol -Continue pressors  Acute kidney injury from tubular necrosis,  baseline creatinine of 0.73 from 10/17/2704 Metabolic alkalosis -CRRT on hold at present -Trend BMET  Status post small bowel resection, lysis of adhesions, wound VAC in place -for OR 12/05/2018  Type 2 diabetes -Continue to hold metformin -SSI  Thrombocytopenia from sepsis -Continue to hold Lovenox -Trend CBC   Shock liver -Trend LFTs  Respiratory alkalosis -Adjust vent -Follow ABG-ABG 12/04/2018 noted  We will obtain CT scan of the head Hold TPN Trend LFTs CRRT on hold for now Monitor neurological status  Best practice:  Diet: N.p.o. DVT prophylaxis: SCDs GI prophylaxis: Protonix Mobility: Bedrest Code Status: Full code Disposition: ICU  Labs    CMP Latest Ref Rng & Units 11/19/2018 12/11/2018 11/26/2018  Glucose 70 - 99 mg/dL 161(H) 158(H) 143(H)  BUN 8 - 23 mg/dL 28(H) 24(H) 20  Creatinine 0.44 - 1.00 mg/dL 1.64(H) 1.59(H) 1.31(H)  Sodium 135 - 145 mmol/L 138 139 138  Potassium 3.5 - 5.1 mmol/L 4.9 4.6 5.0  Chloride 98 - 111 mmol/L 105 105 102  CO2 22 - 32 mmol/L 24 24 27   Calcium 8.9 - 10.3 mg/dL 7.2(L) 7.8(L) 6.6(L)  Total Protein 6.5 - 8.1 g/dL - 4.5(L) -  Total Bilirubin 0.3 - 1.2 mg/dL - 3.6(H) -  Alkaline Phos 38 - 126 U/L - 636(H) -  AST 15 - 41 U/L - 662(H) -  ALT 0 - 44 U/L - 1,162(H) -   CBC Latest Ref Rng & Units 11/30/2018 12/12/2018 11/24/2018  WBC 4.0 - 10.5 K/uL 24.0(H) 19.8(H) 13.7(H)  Hemoglobin 12.0 - 15.0 g/dL 9.9(L) 11.1(L) 11.5(L)  Hematocrit 36.0 - 46.0 % 30.6(L) 32.4(L) 34.0(L)  Platelets 150 - 400 K/uL 13(LL) 25(LL) 28(LL)   ABG    Component Value Date/Time   PHART 7.351 11/17/2018 0052   PCO2ART 43.3 11/18/2018 0052   PO2ART 124 (H) 12/05/2018 0052   HCO3 23.4 11/29/2018 0052   TCO2 16 (L) 12/03/2018 1658   ACIDBASEDEF 1.7 12/02/2018 0052   O2SAT 97.9 12/08/2018 0052   CBG (last 3)  Recent Labs    11/26/18 2021 11/26/18 2346 11/19/2018 0327  GLUCAP 121* 108* 145*   The patient is critically ill with multiple organ system failure and requires high complexity decision making for assessment and support, frequent evaluation and titration of therapies, advanced monitoring, review of radiographic studies and interpretation of complex data.    Critical Care Time devoted to patient care services, exclusive of separately billable procedures,  described in this note is 35 minutes.  Sherrilyn Rist, MD [place], PCCM Cell: [phone]

## 2018-11-28 ENCOUNTER — Encounter (HOSPITAL_COMMUNITY): Admission: EM | Disposition: E | Payer: Self-pay | Source: Home / Self Care

## 2018-11-28 ENCOUNTER — Inpatient Hospital Stay (HOSPITAL_COMMUNITY): Payer: BLUE CROSS/BLUE SHIELD | Admitting: Anesthesiology

## 2018-11-28 DIAGNOSIS — K659 Peritonitis, unspecified: Secondary | ICD-10-CM

## 2018-11-28 DIAGNOSIS — J9600 Acute respiratory failure, unspecified whether with hypoxia or hypercapnia: Secondary | ICD-10-CM

## 2018-11-28 HISTORY — PX: LAPAROTOMY: SHX154

## 2018-11-28 LAB — RENAL FUNCTION PANEL
Albumin: 1.4 g/dL — ABNORMAL LOW (ref 3.5–5.0)
Albumin: 1.5 g/dL — ABNORMAL LOW (ref 3.5–5.0)
Anion gap: 9 (ref 5–15)
Anion gap: 9 (ref 5–15)
BUN: 54 mg/dL — ABNORMAL HIGH (ref 8–23)
BUN: 49 mg/dL — ABNORMAL HIGH (ref 8–23)
CO2: 23 mmol/L (ref 22–32)
CO2: 25 mmol/L (ref 22–32)
Calcium: 6.5 mg/dL — ABNORMAL LOW (ref 8.9–10.3)
Calcium: 6.5 mg/dL — ABNORMAL LOW (ref 8.9–10.3)
Chloride: 106 mmol/L (ref 98–111)
Chloride: 105 mmol/L (ref 98–111)
Creatinine, Ser: 2.73 mg/dL — ABNORMAL HIGH (ref 0.44–1.00)
Creatinine, Ser: 2.33 mg/dL — ABNORMAL HIGH (ref 0.44–1.00)
GFR calc Af Amer: 20 mL/min — ABNORMAL LOW (ref >60)
GFR calc Af Amer: 25 mL/min — ABNORMAL LOW (ref >60)
GFR calc non Af Amer: 18 mL/min — ABNORMAL LOW (ref >60)
GFR calc non Af Amer: 21 mL/min — ABNORMAL LOW (ref >60)
Glucose, Bld: 172 mg/dL — ABNORMAL HIGH (ref 70–99)
Glucose, Bld: 146 mg/dL — ABNORMAL HIGH (ref 70–99)
Phosphorus: 4.2 mg/dL (ref 2.5–4.6)
Phosphorus: 3.8 mg/dL (ref 2.5–4.6)
Potassium: 5 mmol/L (ref 3.5–5.1)
Potassium: 4.8 mmol/L (ref 3.5–5.1)
Sodium: 138 mmol/L (ref 135–145)
Sodium: 139 mmol/L (ref 135–145)

## 2018-11-28 LAB — HEPATIC FUNCTION PANEL
ALT: 643 U/L — ABNORMAL HIGH (ref 0–44)
AST: 235 U/L — ABNORMAL HIGH (ref 15–41)
Albumin: 1.4 g/dL — ABNORMAL LOW (ref 3.5–5.0)
Alkaline Phosphatase: 693 U/L — ABNORMAL HIGH (ref 38–126)
Bilirubin, Direct: 2.6 mg/dL — ABNORMAL HIGH (ref 0.0–0.2)
Indirect Bilirubin: 1.4 mg/dL — ABNORMAL HIGH (ref 0.3–0.9)
Total Bilirubin: 4 mg/dL — ABNORMAL HIGH (ref 0.3–1.2)
Total Protein: 4.2 g/dL — ABNORMAL LOW (ref 6.5–8.1)

## 2018-11-28 LAB — GLUCOSE, CAPILLARY
Glucose-Capillary: 164 mg/dL — ABNORMAL HIGH (ref 70–99)
Glucose-Capillary: 132 mg/dL — ABNORMAL HIGH (ref 70–99)
Glucose-Capillary: 156 mg/dL — ABNORMAL HIGH (ref 70–99)
Glucose-Capillary: 134 mg/dL — ABNORMAL HIGH (ref 70–99)

## 2018-11-28 LAB — LACTIC ACID, PLASMA: Lactic Acid, Venous: 1.7 mmol/L (ref 0.5–1.9)

## 2018-11-28 LAB — MAGNESIUM: Magnesium: 2.8 mg/dL — ABNORMAL HIGH (ref 1.7–2.4)

## 2018-11-28 SURGERY — LAPAROTOMY, EXPLORATORY
Anesthesia: General | Site: Abdomen

## 2018-11-28 MED ORDER — ROCURONIUM BROMIDE 10 MG/ML (PF) SYRINGE
PREFILLED_SYRINGE | INTRAVENOUS | Status: AC
  Filled 2018-11-28: qty 10

## 2018-11-28 MED ORDER — FENTANYL CITRATE (PF) 100 MCG/2ML IJ SOLN
INTRAMUSCULAR | Status: AC
  Filled 2018-11-28: qty 2

## 2018-11-28 MED ORDER — MIDAZOLAM HCL 2 MG/2ML IJ SOLN
INTRAMUSCULAR | Status: AC
  Filled 2018-11-28: qty 2

## 2018-11-28 MED ORDER — FENTANYL CITRATE (PF) 100 MCG/2ML IJ SOLN
INTRAMUSCULAR | Status: DC | PRN
  Administered 2018-11-28 (×4): 100 ug via INTRAVENOUS

## 2018-11-28 MED ORDER — SODIUM ZIRCONIUM CYCLOSILICATE 10 G PO PACK
10.0000 g | PACK | Freq: Two times a day (BID) | ORAL | Status: DC
  Administered 2018-11-28: 10 g via ORAL
  Filled 2018-11-28 (×2): qty 1

## 2018-11-28 MED ORDER — 0.9 % SODIUM CHLORIDE (POUR BTL) OPTIME
TOPICAL | Status: DC | PRN
  Administered 2018-11-28: 08:00:00 6000 mL

## 2018-11-28 MED ORDER — ROCURONIUM BROMIDE 10 MG/ML (PF) SYRINGE
PREFILLED_SYRINGE | INTRAVENOUS | Status: DC | PRN
  Administered 2018-11-28: 100 mg via INTRAVENOUS

## 2018-11-28 MED ORDER — FENTANYL CITRATE (PF) 250 MCG/5ML IJ SOLN
INTRAMUSCULAR | Status: AC
  Filled 2018-11-28: qty 5

## 2018-11-28 MED ORDER — MIDAZOLAM HCL 5 MG/5ML IJ SOLN
INTRAMUSCULAR | Status: DC | PRN
  Administered 2018-11-28: 2 mg via INTRAVENOUS

## 2018-11-28 SURGICAL SUPPLY — 43 items
APL SWBSTK 6 STRL LF DISP (MISCELLANEOUS) ×1
APPLICATOR COTTON TIP 6 STRL (MISCELLANEOUS) ×1 IMPLANT
APPLICATOR COTTON TIP 6IN STRL (MISCELLANEOUS) ×3
BLADE EXTENDED COATED 6.5IN (ELECTRODE) IMPLANT
BLADE HEX COATED 2.75 (ELECTRODE) ×3 IMPLANT
COVER MAYO STAND STRL (DRAPES) IMPLANT
COVER WAND RF STERILE (DRAPES) IMPLANT
DRAPE LAPAROSCOPIC ABDOMINAL (DRAPES) ×3 IMPLANT
DRAPE WARM FLUID 44X44 (DRAPES) ×2 IMPLANT
DRSG PAD ABDOMINAL 8X10 ST (GAUZE/BANDAGES/DRESSINGS) ×2 IMPLANT
ELECT REM PT RETURN 15FT ADLT (MISCELLANEOUS) ×3 IMPLANT
GAUZE SPONGE 4X4 12PLY STRL (GAUZE/BANDAGES/DRESSINGS) ×3 IMPLANT
GLOVE BIOGEL PI IND STRL 7.0 (GLOVE) ×1 IMPLANT
GLOVE BIOGEL PI INDICATOR 7.0 (GLOVE) ×2
GLOVE INDICATOR 8.0 STRL GRN (GLOVE) ×6 IMPLANT
GLOVE SS BIOGEL STRL SZ 8 (GLOVE) ×1 IMPLANT
GLOVE SUPERSENSE BIOGEL SZ 8 (GLOVE) ×2
GOWN STRL REUS W/TWL LRG LVL3 (GOWN DISPOSABLE) ×3 IMPLANT
GOWN STRL REUS W/TWL XL LVL3 (GOWN DISPOSABLE) ×6 IMPLANT
HANDLE SUCTION POOLE (INSTRUMENTS) IMPLANT
KIT BASIN OR (CUSTOM PROCEDURE TRAY) ×3 IMPLANT
KIT TURNOVER KIT A (KITS) IMPLANT
NS IRRIG 1000ML POUR BTL (IV SOLUTION) ×9 IMPLANT
PACK GENERAL/GYN (CUSTOM PROCEDURE TRAY) ×3 IMPLANT
SPONGE ABD ABTHERA ADVANCE (MISCELLANEOUS) ×2 IMPLANT
SPONGE LAP 18X18 RF (DISPOSABLE) IMPLANT
STAPLER GUN LINEAR PROX 60 (STAPLE) ×2 IMPLANT
STAPLER PROXIMATE 75MM BLUE (STAPLE) ×2 IMPLANT
STAPLER VISISTAT 35W (STAPLE) ×3 IMPLANT
SUCTION POOLE HANDLE (INSTRUMENTS)
SUT NOVA NAB GS-21 0 18 T12 DT (SUTURE) ×14 IMPLANT
SUT PDS AB 1 CTX 36 (SUTURE) IMPLANT
SUT SILK 2 0 (SUTURE) ×3
SUT SILK 2-0 18XBRD TIE 12 (SUTURE) IMPLANT
SUT SILK 3 0 (SUTURE)
SUT SILK 3 0 SH CR/8 (SUTURE) IMPLANT
SUT SILK 3-0 18XBRD TIE 12 (SUTURE) IMPLANT
SUT VIC AB 2-0 SH 18 (SUTURE) ×2 IMPLANT
SUT VIC AB 3-0 SH 18 (SUTURE) ×2 IMPLANT
TAPE CLOTH SURG 6X10 WHT LF (GAUZE/BANDAGES/DRESSINGS) ×2 IMPLANT
TOWEL OR 17X26 10 PK STRL BLUE (TOWEL DISPOSABLE) ×6 IMPLANT
TRAY FOLEY MTR SLVR 16FR STAT (SET/KITS/TRAYS/PACK) IMPLANT
YANKAUER SUCT BULB TIP NO VENT (SUCTIONS) ×2 IMPLANT

## 2018-11-28 NOTE — Progress Notes (Signed)
Patients a-line malfunctioning line assessed by RN and RT clot visible at this time line removed and dressing placed.  Dr. Elsworth Soho updated on loss of line.

## 2018-11-28 NOTE — Anesthesia Postprocedure Evaluation (Signed)
Anesthesia Post Note  Patient: Carmen Stone  Procedure(s) Performed: REOPEN LAPAROTOMY INCISION small  BOWEL RESECTION (N/A Abdomen)     Patient location during evaluation: ICU Anesthesia Type: General Level of consciousness: sedated and patient remains intubated per anesthesia plan Pain management: pain level controlled Vital Signs Assessment: post-procedure vital signs reviewed and stable Respiratory status: patient remains intubated per anesthesia plan Cardiovascular status: stable Postop Assessment: no apparent nausea or vomiting Anesthetic complications: no    Last Vitals:  Vitals:   12/05/2018 0921 11/20/2018 0956  BP:    Pulse: 99 98  Resp:    Temp: 36.5 C 36.5 C  SpO2: 99% 100%    Last Pain:  Vitals:   11/22/2018 1200  TempSrc: Core  PainSc:                  Dasiah Hooley

## 2018-11-28 NOTE — Progress Notes (Signed)
[facility] Progress Note Patient Name: Carmen Stone DOB: Jan 16, 1956 MRN: [id number]   Date of Service  11/22/2018  HPI/Events of Note  Patient on Lokelma K 4.8 and NPO post bowel surgery  eICU Interventions  Will discontinue Lokelma for now and monitor K     Intervention Category Intermediate Interventions: Other:  Judd Lien 11/20/2018, 10:43 PM

## 2018-11-28 NOTE — Op Note (Signed)
Preoperative diagnosis: Open abdomen secondary to small bowel resection with subsequent sepsis in discontinuity of small intestine  Postoperative diagnosis: Same  Procedure: Exploratory laparotomy with creation of side-to-side functional end-to-end small bowel anastomosis and closure of abdominal wall  Surgeon: Erroll Luna, MD  Assistant: Dr. Alphonsa Overall, MD  EBL: 10 cc  Specimen: None  Drains: None  IV fluids: Per anesthesia record  Indications for procedure: The patient is a 63 year old female admitted 1 week ago with a small bowel obstruction and subsequent dead small bowel requiring emergent laparotomy with Dr. Coralie Keens and small bowel resection.  Due to sepsis and significant intra-abdominal swelling, the abdomen was left open.  She was reexplored by Dr. Rosendo Gros 2 days later and then by myself 2 days ago and returns today for possible closure and re-anastomosis of her small bowel to restore GI continuity.  She has remained critically ill on the ventilator requiring significant ventilatory support.  She is now been open him 7 days at this point time is felt the closure was necessary or withdrawal care.  Family would like to proceed since she is making some slow progress of improvement and I feel any further attempt to leave her open will be detrimental and counterproductive to her having the opportunity to progress.  The family understands the increased risk of complications at high risk for breakdown of the anastomosis and fistulization.  The small bowel is discontinuity is is so proximal that any attempted a jejunostomy would lead to a high output fistula with disastrous postoperative complications and difficult management.  They understand situation how dire it is but understand the rationale for proceeding understand that if she fails this will certainly lead to death.  They would like to be aggressive and give her every opportunity to survive this.The procedure has been  discussed with the patient.  Alternative therapies have been discussed with the patient.  Operative risks include bleeding,  Infection,  Organ injury,  Nerve injury,  Blood vessel injury,  DVT,  Pulmonary embolism,  Death,  And possible reoperation.  Medical management risks include worsening of present situation.  The success of the procedure is 50 -90 % at treating patients symptoms.  The patient understands and agrees to proceed.    Description of procedure: The patient was brought to the ICU directly intubated.  She is brought to the operating room and placed supine upon the operating room table.  After ensuring adequate levels of anesthesia, the wound VAC was removed.  Wound and skin were prepped and draped in a sterile fashion.  Timeout was done.  The abdomen was opened.  The small bowel was run from ligament of Treitz down to the proximal division of the small bowel which was about 20 cm to 30 cm from the ligament of Treitz.  There is a suture holding the distal bout of this.  However in the distal bowel and again there is no signs of any progressive ischemia.  There was some fibrinous exudative material but no ischemic or area the small bowel that had a deficiency.  The ascending colon, transverse colon, and descending colon were normal.  The rectum was normal.  The stomach could not be visualized due to upper abdominal adhesions of the liver.  Normal.  Irrigation was used.  I discussed the case with Dr. Lucia Gaskins and we both felt that restoring her GI continuity at this point time would be beneficial.  We weighed the risks and benefits and decided to proceed.  In a  side-to-side fashion I created a enteroenterostomy.  This was done with a GIA 75 stapler and a TA 60.  Stitches were placed in the crotch and oversewed the staple line.  The tissue appeared healthy but mildly edematous but not severely edematous.  There is no tension on the anastomosis and it was widely patent.  I then ran the small bowel again  and saw no kinking or twisting.  This was placed back in the abdomen.  Irrigation was used until clear.  Interrupted #1 Novafil was used and we were able to close the entire abdominal wall with ease.  There was not an excessive amount of tension and she had good pliability of the abdominal wall and also the edema had subsided significantly.  This was packed open with Kerlix.  Dry dressing was applied.  All final counts were found to be correct.  The patient was transferred to the ICU in critical but stable condition.  Her brother was updated after the procedure.

## 2018-11-28 NOTE — Progress Notes (Signed)
[facility] Progress Note  Subjective: going to OR today for abdomen closure  Vitals:   11/19/2018 0730 11/20/2018 0921 12/12/2018 0956 12/07/2018 1100  BP:      Pulse:  99 98 89  Resp: 16   20  Temp: 99.3 F (37.4 C) 97.7 F (36.5 C) 97.7 F (36.5 C) (!) 97.3 F (36.3 C)  TempSrc:      SpO2:  99% 100% 100%  Weight:      Height:        Inpatient medications: . sodium chloride   Intravenous Once  . sodium chloride   Intravenous Once  . chlorhexidine gluconate (MEDLINE KIT)  15 mL Mouth Rinse BID  . Chlorhexidine Gluconate Cloth  6 each Topical Daily  . dorzolamide-timolol  1 drop Both Eyes BID  . hydrocortisone sod succinate (SOLU-CORTEF) inj  50 mg Intravenous Q6H  . insulin aspart  3-9 Units Subcutaneous Q4H  . mouth rinse  15 mL Mouth Rinse 10 times per day  . pantoprazole (PROTONIX) IV  40 mg Intravenous Q24H  . sodium zirconium cyclosilicate  10 g Oral BID   .  prismasol BGK 4/2.5 400 mL/hr at 11/18/2018 0948  .  prismasol BGK 4/2.5 200 mL/hr at 11/22/2018 0948  . sodium chloride 10 mL/hr at 11/29/2018 0730  . fentaNYL infusion INTRAVENOUS 50 mcg/hr (12/09/2018 1114)  . norepinephrine (LEVOPHED) Adult infusion 1 mcg/min (12/05/2018 1100)  . phenylephrine (NEO-SYNEPHRINE) Adult infusion Stopped (11/15/2018 1615)  . piperacillin-tazobactam (ZOSYN)  IV Stopped (12/09/2018 0322)  . prismasol BGK 4/2.5 1,750 mL/hr at 12/06/2018 0948  . vasopressin (PITRESSIN) infusion - *FOR SHOCK* 0.03 Units/min (12/07/2018 1101)   sodium chloride, acetaminophen, albuterol, alteplase, [DISCONTINUED] diphenhydrAMINE **OR** diphenhydrAMINE, fentaNYL, heparin, sodium chloride, sodium chloride flush    Exam: Gen on vent, sedated, ETT  No jvd or bruits Chest clear bilat ant and lat RRR no MRG Abd soft ntnd no mass or ascites +bs, large midline wound vac  GU foley cath minimal urine Ext diffuse LE/ UE 2+ edema Neuro is sedated on the vent   Home meds:  - insulin glargine 10u/ metformin 500  bid/ novolog 70/30 20u bid ac  - atorvastatin 10/ levothyroxine 25 ug/ pravastatin 10   UA neg on 6/7  UNa 35,  UCr 34  CXR 6/9 > bilat effusions and bilat atx  Assessment: 1. Acute renal failure - in setting of septic shock due to perf bowel, suspected ATN. SP CRRT 6/9 - 6/12, held after PEA arrest.  No UOP. Resume after surgery today. Keeping even, can pull fluid if coming off pressors  2. SP PEA arrest: on 6/13 in ICU, brief arrest  3. Septic shock: low dose pressors 4. Perforated small bowel due to closed-loop SBO / septic shock- per CCM/ gen surg, to OR today 5. Thrombocytopenia  6. DM on insulin 7. Vol overload - diffuse edema on exam 15kg+   [facility] 12/09/2018, 11:53 AM  Iron/TIBC/Ferritin/ %Sat No results found for: IRON, TIBC, FERRITIN, IRONPCTSAT Recent Labs  Lab 11/15/2018 0327  11/30/2018 0303  NA 139   < > 138  K 4.3   < > 5.0  CL 105   < > 106  CO2 17*   < > 23  GLUCOSE 171*   < > 172*  BUN 27*   < > 54*  CREATININE 2.09*   < > 2.73*  CALCIUM 6.2*   < > 6.5*  PHOS 3.4   < > 4.2  ALBUMIN 1.5*   < > 1.4*  1.4*  INR 1.6*  --   --    < > = values in this interval not displayed.   Recent Labs  Lab 12/02/2018 0303  AST 235*  ALT 643*  ALKPHOS 693*  BILITOT 4.0*  PROT 4.2*   Recent Labs  Lab 11/29/2018 1934  WBC 27.7*  HGB 8.7*  HCT 26.0*  PLT 34*

## 2018-11-28 NOTE — Progress Notes (Signed)
Attempted to access port a cath but not blood return.

## 2018-11-28 NOTE — Progress Notes (Signed)
Found patient saturated in serous fluid from abdominal wound. elink and charge nurse notified. Instructed to change ABD pads and reinforce dressing. Will continue to monitor at this time.

## 2018-11-28 NOTE — Progress Notes (Signed)
NAME:  Carmen Stone, MRN:  [id number], DOB:  Apr 10, 1956, LOS: 7 ADMISSION DATE:  11/20/2018, CONSULTATION DATE:  11/21/2018 REFERRING MD:  Dr. Ninfa Linden, Surgery, CHIEF COMPLAINT:  Abdominal pain   Brief History   63 yo female presented to ER with nausea, vomiting, abdominal pain.  CT abd/pelvis showed focal perforated loop of SB with free air with multiple complex ventral hernias.  Had emergent laparotomy with SB resection, lysis of adhesions and wound vac.  Remained on vent and pressors post op.  Cardiorespiratory arrest 12/01/2018-lasted a couple of minutes  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, HTN, HLD, DM type II  Significant Hospital Events   6/07 Admit, to OR 6/08 transfuse FFP, on multiple pressors 6/09 To OR for wash out, start CRRT 6/12-still on CRRT, no urine output 6/13-acute respiratory arrest overnight, off CRRT, on pressors, limit sedation 6/14 bowel anastomosis and closure of abdominal wall  Consults:  Renal 6/09 AKI, acidosis Cardiology 6/09 Takotsubo CM   Procedures:  ETT 6/07 >> Rt IJ HD 6/09 >> Rt [place] CVL 6/09 >> Lt radial aline 6/09 >>  Significant Diagnostic Tests:  CT abd/pelvis 6/07 >> focal perforated loop of SB with free air with multiple complex ventral hernias Echo 6/08 >> EF 20%, Takotsubo CM  Micro Data:  COVID 6/07 >> negative  Antimicrobials:  Zosyn 6/07 >> Diflucan 6/07 -6/10  Interim history/subjective:   Remains critically ill, intubated Examined after return from the OR and abdominal closure Pressors being tapered Back on CRRT   Objective   Blood pressure (!) 117/54, pulse 89, temperature (!) 97.3 F (36.3 C), resp. rate 20, height 5\' 4"  (1.626 m), weight 89.5 kg, SpO2 100 %. CVP:  [6 mmHg-9 mmHg] 8 mmHg  Vent Mode: PRVC FiO2 (%):  [40 %-50 %] 40 % Set Rate:  [20 bmp] 20 bmp Vt Set:  [430 mL] 430 mL PEEP:  [8 cmH20] 8 cmH20 Plateau Pressure:  [21 cmH20-24 cmH20] 24 cmH20    Intake/Output Summary (Last 24 hours) at 11/24/2018 1221 Last data filed at 11/15/2018 1200 Gross per 24 hour  Intake 789.34 ml  Output 576 ml  Net 213.34 ml   Filed Weights   11/26/18 0300 11/24/2018 0335 12/04/2018 0400  Weight: 88.3 kg 92.4 kg 89.5 kg    Examination: Critically ill, sedated with fentanyl drip, no distress Pupils pinpoint, reactive Mild pallor, no icterus S1-S2 tachycardic Fair air entry bilaterally 1+ edema   Chest x-ray 6/13 personally reviewed which shows improved bilateral effusions and better aeration of both lungs   Resolved Hospital Problem list     Assessment & Plan:   Cardiorespiratory arrest 11/20/2018   Acute hypoxic respiratory failure in setting of peritonitis -Drop PEEP to 5 -Start spontaneous breathing trials 6/15  Septic shock from peritonitis -Pressors being weaned -Maintain an M AP of greater than 65 -Day 8/10 of Zosyn, Diflucan discontinued 6/10 secondary to elevated LFTs  Acute systolic congestive heart failure with ejection fraction of 20%, 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  Component of cardiogenic shock -Cardiology following -Hold lisinopril, Pravachol   Acute kidney injury from tubular necrosis, baseline creatinine of 0.73 from 12/22/2954 Metabolic alkalosis -CRRT resumed, can aim for negative balance if blood pressure tolerates -Trend BMET   Acute encephalopathy-may be related to delayed clearance of medications Use low-dose fentanyl drip, goal RA SS 0 to -1  Status post small bowel resection, lysis of adhesions, wound VAC in place -Plan per surgery -Resume TNA  Type 2 diabetes -Continue to hold metformin -SSI  Thrombocytopenia from sepsis -Continue to hold Lovenox -Trend CBC  Shock liver, improving -Trend LFTs  Summary -multiorgan failure related to bowel perforation and septic shock, now improving gradually  Best practice:  Diet: N.p.o. DVT prophylaxis: SCDs GI prophylaxis: Protonix Mobility:  Bedrest Code Status: Full code Disposition: ICU  The patient is critically ill with multiple organ systems failure and requires high complexity decision making for assessment and support, frequent evaluation and titration of therapies, application of advanced monitoring technologies and extensive interpretation of multiple databases. Critical Care Time devoted to patient care services described in this note independent of APP/resident  time is 33 minutes.   Kara Mead MD. [place] Flood. [facility] Pager [phone] If no response call [phone]   12/05/2018

## 2018-11-28 NOTE — Transfer of Care (Signed)
Immediate Anesthesia Transfer of Care Note  Patient: Carmen Stone  Procedure(s) Performed: REOPEN LAPAROTOMY INCISION small  BOWEL RESECTION (N/A Abdomen)  Patient Location: ICU  Anesthesia Type:General  Level of Consciousness: Patient remains intubated per anesthesia plan  Airway & Oxygen Therapy: Patient remains intubated per anesthesia plan and Patient placed on Ventilator (see vital sign flow sheet for setting)  Post-op Assessment: Report given to RN and Post -op Vital signs reviewed and stable  Post vital signs: Reviewed and stable  Last Vitals:  Vitals Value Taken Time  BP    Temp    Pulse    Resp    SpO2      Last Pain:  Vitals:   12/09/2018 1200  TempSrc: Core  PainSc:          Complications: No apparent anesthesia complications

## 2018-11-29 ENCOUNTER — Encounter (HOSPITAL_COMMUNITY): Payer: Self-pay | Admitting: Surgery

## 2018-11-29 ENCOUNTER — Inpatient Hospital Stay (HOSPITAL_COMMUNITY): Payer: BLUE CROSS/BLUE SHIELD

## 2018-11-29 DIAGNOSIS — L8990 Pressure ulcer of unspecified site, unspecified stage: Secondary | ICD-10-CM | POA: Insufficient documentation

## 2018-11-29 LAB — RENAL FUNCTION PANEL
Albumin: 1.3 g/dL — ABNORMAL LOW (ref 3.5–5.0)
Albumin: 1.6 g/dL — ABNORMAL LOW (ref 3.5–5.0)
Anion gap: 9 (ref 5–15)
Anion gap: 11 (ref 5–15)
BUN: 42 mg/dL — ABNORMAL HIGH (ref 8–23)
BUN: 36 mg/dL — ABNORMAL HIGH (ref 8–23)
CO2: 25 mmol/L (ref 22–32)
CO2: 24 mmol/L (ref 22–32)
Calcium: 6.3 mg/dL — CL (ref 8.9–10.3)
Calcium: 6.8 mg/dL — ABNORMAL LOW (ref 8.9–10.3)
Chloride: 105 mmol/L (ref 98–111)
Chloride: 105 mmol/L (ref 98–111)
Creatinine, Ser: 2.03 mg/dL — ABNORMAL HIGH (ref 0.44–1.00)
Creatinine, Ser: 1.74 mg/dL — ABNORMAL HIGH (ref 0.44–1.00)
GFR calc Af Amer: 29 mL/min — ABNORMAL LOW (ref >60)
GFR calc Af Amer: 35 mL/min — ABNORMAL LOW (ref >60)
GFR calc non Af Amer: 25 mL/min — ABNORMAL LOW (ref >60)
GFR calc non Af Amer: 30 mL/min — ABNORMAL LOW (ref >60)
Glucose, Bld: 140 mg/dL — ABNORMAL HIGH (ref 70–99)
Glucose, Bld: 134 mg/dL — ABNORMAL HIGH (ref 70–99)
Phosphorus: 3.4 mg/dL (ref 2.5–4.6)
Phosphorus: 3.4 mg/dL (ref 2.5–4.6)
Potassium: 5.1 mmol/L (ref 3.5–5.1)
Potassium: 5 mmol/L (ref 3.5–5.1)
Sodium: 139 mmol/L (ref 135–145)
Sodium: 140 mmol/L (ref 135–145)

## 2018-11-29 LAB — CBC
HCT: 24.8 % — ABNORMAL LOW (ref 36.0–46.0)
Hemoglobin: 8 g/dL — ABNORMAL LOW (ref 12.0–15.0)
MCH: 29.3 pg (ref 26.0–34.0)
MCHC: 32.3 g/dL (ref 30.0–36.0)
MCV: 90.8 fL (ref 80.0–100.0)
Platelets: 35 10*3/uL — ABNORMAL LOW (ref 150–400)
RBC: 2.73 MIL/uL — ABNORMAL LOW (ref 3.87–5.11)
RDW: 15.3 % (ref 11.5–15.5)
WBC: 19.6 10*3/uL — ABNORMAL HIGH (ref 4.0–10.5)
nRBC: 0.7 % — ABNORMAL HIGH (ref 0.0–0.2)

## 2018-11-29 LAB — BPAM RBC
Blood Product Expiration Date: 202007012359
Blood Product Expiration Date: 202007052359
Unit Type and Rh: 5100
Unit Type and Rh: 5100

## 2018-11-29 LAB — TYPE AND SCREEN
ABO/RH(D): O POS
Antibody Screen: NEGATIVE
Unit division: 0
Unit division: 0

## 2018-11-29 LAB — GLUCOSE, CAPILLARY
Glucose-Capillary: 115 mg/dL — ABNORMAL HIGH (ref 70–99)
Glucose-Capillary: 136 mg/dL — ABNORMAL HIGH (ref 70–99)
Glucose-Capillary: 129 mg/dL — ABNORMAL HIGH (ref 70–99)
Glucose-Capillary: 135 mg/dL — ABNORMAL HIGH (ref 70–99)
Glucose-Capillary: 126 mg/dL — ABNORMAL HIGH (ref 70–99)
Glucose-Capillary: 106 mg/dL — ABNORMAL HIGH (ref 70–99)
Glucose-Capillary: 119 mg/dL — ABNORMAL HIGH (ref 70–99)

## 2018-11-29 LAB — MAGNESIUM: Magnesium: 2.7 mg/dL — ABNORMAL HIGH (ref 1.7–2.4)

## 2018-11-29 MED ORDER — FENTANYL CITRATE (PF) 100 MCG/2ML IJ SOLN
25.0000 ug | INTRAMUSCULAR | Status: DC | PRN
  Administered 2018-11-29 (×2): 100 ug via INTRAVENOUS
  Administered 2018-11-30: 50 ug via INTRAVENOUS
  Administered 2018-11-30 (×3): 100 ug via INTRAVENOUS
  Administered 2018-11-30: 50 ug via INTRAVENOUS
  Administered 2018-11-30: 100 ug via INTRAVENOUS
  Filled 2018-11-29 (×7): qty 2

## 2018-11-29 MED ORDER — FENTANYL CITRATE (PF) 100 MCG/2ML IJ SOLN
25.0000 ug | INTRAMUSCULAR | Status: DC | PRN
  Administered 2018-11-29 (×2): 25 ug via INTRAVENOUS
  Filled 2018-11-29 (×2): qty 2

## 2018-11-29 NOTE — Progress Notes (Addendum)
Carmen Stone Progress Note  Subjective: s/p abdominal closure yesterday.  Off pressors today.    Vitals:   11/29/18 1100 11/29/18 1116 11/29/18 1130 11/29/18 1200  BP: (!) 168/75  (!) 189/92 (!) 171/76  Pulse: (!) 104  (!) 109 (!) 106  Resp: 16  (!) 8 (!) 24  Temp: 98.4 F (36.9 C)  98.4 F (36.9 C) 98.4 F (36.9 C)  TempSrc:      SpO2: 97% 98% 98% 98%  Weight:      Height:        Inpatient medications: . sodium chloride   Intravenous Once  . sodium chloride   Intravenous Once  . chlorhexidine gluconate (MEDLINE KIT)  15 mL Mouth Rinse BID  . Chlorhexidine Gluconate Cloth  6 each Topical Daily  . dorzolamide-timolol  1 drop Both Eyes BID  . insulin aspart  3-9 Units Subcutaneous Q4H  . mouth rinse  15 mL Mouth Rinse 10 times per day  . pantoprazole (PROTONIX) IV  40 mg Intravenous Q24H   .  prismasol BGK 4/2.5 400 mL/hr at 11/29/18 1000  .  prismasol BGK 4/2.5 200 mL/hr at 11/29/18 1113  . sodium chloride 10 mL/hr at 11/29/18 1100  . norepinephrine (LEVOPHED) Adult infusion Stopped (11/29/18 0800)  . piperacillin-tazobactam (ZOSYN)  IV Stopped (11/29/18 0846)  . prismasol BGK 4/2.5 1,750 mL/hr at 11/29/18 0848   sodium chloride, acetaminophen, albuterol, alteplase, fentaNYL (SUBLIMAZE) injection, fentaNYL (SUBLIMAZE) injection, heparin, sodium chloride, sodium chloride flush    Exam: Gen on vent, sedated, not following commands NECK: + JVD to angle of mandible  PULM: coarse mech breath sounds bilaterally CV: RRR no MRG ABD: soft ntnd no mass or ascites +bs, large midline wound vac  GU foley cath minimal urine Ext diffuse LE/ UE 2+ edema Neuro is sedated on the vent   Assessment: 1. Acute renal failure - in setting of septic shock due to perf bowel, suspected ATN. SP CRRT 6/9 - 6/12, held after PEA arrest.  No UOP. Resumed yesterday after abdominal closure, doing well.  Off pressors, pulling 100 mL/ hr and tolerating, will try to increase to 200 mL/  hr if can tolerate.  All 4K, no heparin.  At least 10 kg vol up.  Corrected calcium is 8.5.   2. SP PEA arrest: on 6/13 in ICU, brief arrest  3. Acute hypoxic respiratory failure: weaning as able, per PCCM 4. Septic shock: on Zosyn, s/p fluconazole 5. Perforated small bowel due to closed-loop SBO / septic shock- per CCM/ gen surg, s/p closure yesterday 6/14. 6. DM on insulin 7. Severe hypoalbuminemia: per primary 8. Dispo: remains critically ill in ICU   [place] pgr [phone] 11/29/2018, 12:02 PM  Iron/TIBC/Ferritin/ %Sat No results found for: IRON, TIBC, FERRITIN, IRONPCTSAT Recent Labs  Lab 12/02/2018 0327  11/29/18 0206  NA 139   < > 139  K 4.3   < > 5.1  CL 105   < > 105  CO2 17*   < > 25  GLUCOSE 171*   < > 140*  BUN 27*   < > 42*  CREATININE 2.09*   < > 2.03*  CALCIUM 6.2*   < > 6.3*  PHOS 3.4   < > 3.4  ALBUMIN 1.5*   < > 1.3*  INR 1.6*  --   --    < > = values in this interval not displayed.   Recent Labs  Lab 12/12/2018 0303  AST 235*  ALT 643*  ALKPHOS 693*  BILITOT 4.0*  PROT 4.2*   Recent Labs  Lab 11/29/18 0206  WBC 19.6*  HGB 8.0*  HCT 24.8*  PLT 35*

## 2018-11-29 NOTE — Progress Notes (Signed)
Patient ID: Carmen Stone, female   DOB: November 26, 1955, 63 y.o.   MRN: [id number]    1 Day Post-Op  Subjective: Patient still on vent.  Weaning pressors to off this morning.  Still on CRRT with no urine output.  NGT with about 400cc documented overnight.  Responds to pain, but does not follow commands per nurse.  Objective: Vital signs in last 24 hours: Temp:  [97 F (36.1 C)-99.3 F (37.4 C)] 99.1 F (37.3 C) (06/15 0700) Pulse Rate:  [77-103] 99 (06/15 0700) Resp:  [0-20] 0 (06/15 0700) BP: (91-161)/(44-87) 123/61 (06/15 0700) SpO2:  [96 %-100 %] 100 % (06/15 0805) Arterial Line BP: (106-136)/(41-54) 117/44 (06/14 1500) FiO2 (%):  [30 %-40 %] 30 % (06/15 0819) Weight:  [90 kg] 90 kg (06/15 0317) Last BM Date: 11/24/18  Intake/Output from previous day: 06/14 0701 - 06/15 0700 In: 680.2 [I.V.:479.2; IV Piggyback:150] Out: 732 [Emesis/NG output:30; Blood:20] Intake/Output this shift: Total I/O In: 26.2 [I.V.:26.2] Out: 38 [Other:17]  PE: Heart: regular Lungs: CTAB on vent, ETT in place.  HD cath in place in RUC Abd: soft, midline wound with some necrotic adipose tissue.  Serous drainage noted on ABD.  No obvious fascial separation at this time seen or palpated.  NGT in place with bilious output.  Lab Results:  Recent Labs    11/26/2018 1934 11/29/18 0206  WBC 27.7* 19.6*  HGB 8.7* 8.0*  HCT 26.0* 24.8*  PLT 34* 35*   BMET Recent Labs    12/12/2018 1600 11/29/18 0206  NA 139 139  K 4.8 5.1  CL 105 105  CO2 25 25  GLUCOSE 146* 140*  BUN 49* 42*  CREATININE 2.33* 2.03*  CALCIUM 6.5* 6.3*   PT/INR No results for input(s): LABPROT, INR in the last 72 hours. CMP     Component Value Date/Time   NA 139 11/29/2018 0206   NA 138 06/01/2017 0859   K 5.1 11/29/2018 0206   K 4.2 06/01/2017 0859   CL 105 11/29/2018 0206   CO2 25 11/29/2018 0206   CO2 24 06/01/2017 0859   GLUCOSE 140 (H) 11/29/2018 0206   GLUCOSE 118 06/01/2017 0859   BUN 42 (H) 11/29/2018 0206    BUN 15.2 06/01/2017 0859   CREATININE 2.03 (H) 11/29/2018 0206   CREATININE 0.79 06/02/2018 0856   CREATININE 0.7 06/01/2017 0859   CALCIUM 6.3 (LL) 11/29/2018 0206   CALCIUM 9.0 06/01/2017 0859   PROT 4.2 (L) 11/30/2018 0303   PROT 7.0 06/01/2017 0859   ALBUMIN 1.3 (L) 11/29/2018 0206   ALBUMIN 3.7 06/01/2017 0859   AST 235 (H) 12/11/2018 0303   AST 13 (L) 06/02/2018 0856   AST 14 06/01/2017 0859   ALT 643 (H) 11/29/2018 0303   ALT 16 06/02/2018 0856   ALT 12 06/01/2017 0859   ALKPHOS 693 (H) 12/02/2018 0303   ALKPHOS 97 06/01/2017 0859   BILITOT 4.0 (H) 12/03/2018 0303   BILITOT 0.3 06/02/2018 0856   BILITOT 0.22 06/01/2017 0859   GFRNONAA 25 (L) 11/29/2018 0206   GFRNONAA >60 06/02/2018 0856   GFRAA 29 (L) 11/29/2018 0206   GFRAA >60 06/02/2018 0856   Lipase     Component Value Date/Time   LIPASE 24 12/09/2018 0355       Studies/Results: Ct Head Wo Contrast  Result Date: 12/04/2018 CLINICAL DATA:  Encephalopathy and altered mental status. EXAM: CT HEAD WITHOUT CONTRAST TECHNIQUE: Contiguous axial images were obtained from the base of the skull through the vertex  without intravenous contrast. COMPARISON:  None. FINDINGS: Despite efforts by the technologist and patient, motion artifact is present on today's exam and could not be eliminated. This reduces exam sensitivity and specificity. Brain: Mega cisterna magna. The brainstem, cerebellum, cerebral peduncles, thalami, basal ganglia, basilar cisterns, and ventricular system appear within normal limits. No intracranial hemorrhage, mass lesion, or acute CVA. Vascular: Unremarkable Skull: Unremarkable Sinuses/Orbits: Small bilateral mastoid effusions. Other: The patient is nasally intubated. Possible mild supraorbital right scalp soft tissue swelling. IMPRESSION: 1. No acute intracranial findings. 2. Small bilateral mastoid effusions. 3. Possible scalp swelling in the right supraorbital region. Electronically Signed   By:  Van Clines M.D.   On: 11/26/2018 12:14   Dg Chest Port 1 View  Result Date: 11/29/2018 CLINICAL DATA:  Acute respiratory failure. EXAM: PORTABLE CHEST 1 VIEW COMPARISON:  11/24/2018. FINDINGS: Endotracheal tube, NG tube, right IJ line, right subclavian line, left PowerPort catheter in stable position. Heart size stable. Diffuse bilateral interstitial prominence suggesting interstitial edema noted. Findings have worsened from prior exam. Pneumonitis cannot be excluded. Progressive bilateral pleural effusions. These findings again suggest CHF. No pneumothorax. No acute bony abnormality. Degenerative change thoracic spine. Surgical clips right axilla. IMPRESSION: 1.  Lines and tubes stable position. 2. Findings most consistent with CHF with bilateral pulmonary interstitial edema and bilateral pleural effusions. Findings have worsened from prior exam. Electronically Signed   By: [place]   On: 11/29/2018 08:00    Anti-infectives: Anti-infectives (From admission, onward)   Start     Dose/Rate Route Frequency Ordered Stop   12/11/2018 1800  fluconazole (DIFLUCAN) IVPB 200 mg  Status:  Discontinued     200 mg 100 mL/hr over 60 Minutes Intravenous Every 24 hours 11/22/2018 0748 11/29/2018 0847   12/11/2018 1800  fluconazole (DIFLUCAN) IVPB 400 mg  Status:  Discontinued     400 mg 100 mL/hr over 120 Minutes Intravenous Every 24 hours 11/16/2018 0847 12/01/2018 0856   11/20/2018 1400  piperacillin-tazobactam (ZOSYN) IVPB 2.25 g     2.25 g 100 mL/hr over 30 Minutes Intravenous Every 6 hours 11/22/2018 0748     11/15/2018 0600  cefoTEtan (CEFOTAN) 2 g in sodium chloride 0.9 % 100 mL IVPB     2 g 200 mL/hr over 30 Minutes Intravenous On call to O.R. 11/22/18 1300 11/26/2018 0624   11/22/18 1800  fluconazole (DIFLUCAN) IVPB 400 mg  Status:  Discontinued     400 mg 100 mL/hr over 120 Minutes Intravenous Every 24 hours 12/08/2018 1528 11/16/2018 0748   11/22/18 1800  vancomycin (VANCOCIN) 1,500 mg in sodium  chloride 0.9 % 500 mL IVPB  Status:  Discontinued     1,500 mg 250 mL/hr over 120 Minutes Intravenous Every 24 hours 11/22/18 0358 11/22/18 0750   11/22/18 0115  vancomycin (VANCOCIN) 1,500 mg in sodium chloride 0.9 % 500 mL IVPB     1,500 mg 250 mL/hr over 120 Minutes Intravenous  Once 11/22/18 0101 11/22/18 0319   11/26/2018 1630  fluconazole (DIFLUCAN) IVPB 800 mg     800 mg 100 mL/hr over 240 Minutes Intravenous  Once 11/22/2018 1527 11/16/2018 2013   11/22/2018 1600  piperacillin-tazobactam (ZOSYN) IVPB 3.375 g  Status:  Discontinued     3.375 g 12.5 mL/hr over 240 Minutes Intravenous Every 8 hours 12/14/2018 1522 11/18/2018 0748   11/30/2018 0545  piperacillin-tazobactam (ZOSYN) IVPB 3.375 g     3.375 g 100 mL/hr over 30 Minutes Intravenous  Once 11/24/2018 0530 11/29/2018 0626   12/09/2018  0530  piperacillin-tazobactam (ZOSYN) IVPB 4.5 g  Status:  Discontinued     4.5 g 200 mL/hr over 30 Minutes Intravenous  Once 12/11/2018 0517 12/12/2018 0529       Assessment/Plan HTN HLD DM Hypothyroidism 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 S/pEXPLORATORY LAPAROTOMY,SMALL BOWEL RESECTION,LYSIS OF ADHESIONS (1.5 HOURS),PLACEMENT OF WOUND VAC6/7 Dr. Ninfa Linden 2. S/p [facility] Dr. Rosendo Gros 3. S/p Exploratory laparotomy with washout of abdomen and placement of vacuum VAC dressing 6/11 Dr. Brantley Stage 4. Exploratory laparotomy with anastomosis of SB and closure of abdominal wall 6/14 Dr. Brantley Stage -cont NGT and await ileus resolution -TNA on hold due to shock liver.  Cont to follow LFTs so we can start some type of nutrition as soon as we can. -zosyn D8/10 -cont BID NSWD dressing  changes to midline wound              ID -zosyn 6/7>>, diflucan 6/7>>6/11, vancomycin 6/8>>6/8 FEN -IVF, NPO/NGT. Holding on TPN due to shock liver VTE -SCDs, lovenox on hold for thrombocytopenia Foley -in place Contact - brother [name] [phone] or [phone])   LOS: 8 days    [name] , [facility] Surgery 11/29/2018, 8:24 AM Pager: [phone]

## 2018-11-29 NOTE — Progress Notes (Signed)
Pharmacy Antibiotic Note  Carmen Stone is a 63 y.o. female admitted on 12/06/2018 with perforated loop of SB.  Pharmacy had been consulted for Zosyn. 6/7: perforated small bowel from closed-loop obstruction. S/p exp lap. SBR, LOA, placement of wound VAC.   Today, 11/29/18  Day 9 Zosyn  Afebrile  WBC 19.6 -stable, stopping stress dose steroids today  Continues on CRRT  Plan:  Zosyn to 2.25g IV q6 - this remains appropriate dose for CRRT  F/u renal fxn, clinical course  Noted plan to continue for 10 days   Height: 5\' 4"  (162.6 cm) Weight: 198 lb 6.6 oz (90 kg) IBW/kg (Calculated) : 54.7  Temp (24hrs), Avg:98.4 F (36.9 C), Min:97 F (36.1 C), Max:99.3 F (37.4 C)  Recent Labs  Lab 12/05/2018 1945  11/24/18 2316  12/01/2018 0730  12/06/2018 0051 12/06/2018 0052 11/26/2018 0333 12/01/2018 1600 11/20/2018 1934 11/20/2018 0303 12/08/2018 1102 12/09/2018 1600 11/29/18 0206  WBC 10.1   < > 13.7*  --  19.8*  --   --  24.0*  --   --  27.7*  --   --   --  19.6*  CREATININE 3.29*   < >  --    < >  --    < >  --  1.59* 1.64* 2.20*  --  2.73*  --  2.33* 2.03*  LATICACIDVEN 9.5*  --   --   --   --   --  3.6*  --  2.7*  --   --   --  1.7  --   --    < > = values in this interval not displayed.    Estimated Creatinine Clearance: 30.4 mL/min (A) (by C-G formula based on SCr of 2.03 mg/dL (H)).    No Known Allergies  Antimicrobials this admission: 6/7 zosyn> 6/7 fluconazole> 6/11  Microbiology results: 6/7 Covid 19 negative 6/8 MRSA PCR: negative  Thank you for allowing pharmacy to be a part of this patient's care.  Netta Cedars, PharmD, BCPS 11/29/2018 11:50 AM

## 2018-11-29 NOTE — Progress Notes (Addendum)
NAME:  Carmen Stone, MRN:  [id number], DOB:  10/01/1955, LOS: 8 ADMISSION DATE:  12/04/2018, CONSULTATION DATE:  11/21/2018 REFERRING MD:  Dr. Ninfa Linden, Surgery, CHIEF COMPLAINT:  Abdominal pain   Brief History   63 yo female presented to ER with nausea, vomiting, abdominal pain.  CT abd/pelvis showed focal perforated loop of SB with free air with multiple complex ventral hernias.  Had emergent laparotomy with SB resection, lysis of adhesions and wound vac.  Remained on vent and pressors post op.  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  Changing volume goal on CRRT to -100 cc an hour.  Starting to wean.  Fentanyl infusion stopped changed to PRN  Consults:  Renal 6/09 AKI, acidosis Cardiology 6/09 Takotsubo CM   Procedures:  ETT 6/07 >> Rt IJ HD 6/09 >> Rt [place] CVL 6/09 >> Lt radial aline 6/09 >>  Significant Diagnostic Tests:  CT abd/pelvis 6/07 >> focal perforated loop of SB with free air with multiple complex ventral hernias Echo 6/08 >> EF 20%, Takotsubo CM  Micro Data:  COVID 6/07 >> negative  Antimicrobials:  Zosyn 6/07 >> Diflucan 6/07 -6/10  Interim history/subjective:   Sedated on ventilator, now weaning.  No acute issues over the evening   Objective   Blood pressure 123/61, pulse 99, temperature 99.1 F (37.3 C), resp. rate (Abnormal) 0, height 5\' 4"  (1.626 m), weight 90 kg, SpO2 100 %. CVP:  [1 mmHg-10 mmHg] 10 mmHg  Vent Mode: CPAP;PSV FiO2 (%):  [30 %-40 %] 30 % Set Rate:  [20 bmp] 20 bmp Vt Set:  [430 mL] 430 mL PEEP:   [5 cmH20-8 cmH20] 5 cmH20 Pressure Support:  [10 cmH20] 10 cmH20 Plateau Pressure:  [18 cmH20-220 cmH20] 18 cmH20   Intake/Output Summary (Last 24 hours) at 11/29/2018 4174 Last data filed at 11/29/2018 0814 Gross per 24 hour  Intake 667.35 ml  Output 729 ml  Net -61.65 ml   Filed Weights   12/09/2018 0335 11/17/2018 0400 11/29/18 0317  Weight: 92.4 kg 89.5 kg 90 kg    Examination: General: This is a 63 year old black female currently sedated on mechanical ventilation HEENT normocephalic atraumatic mild scleral edema orally intubated no JVD Pulmonary: Diminished throughout tidal volume in the 350 range on pressure support ventilation of 8 cm water and PEEP of 5 Cardiac: Regular rate and rhythm without murmur rub or gallop Abdomen: Soft incision well approximated hypoactive bowel sounds Extremities: Weeping extremities with diffuse anasarca pulses are palpable Neuro: Slowed interactive, not following commands as of yet, no focal motor deficits are appreciated GU: Minimal urine output    Resolved Hospital Problem list   Metabolic alkalosis Abdominal peritonitis with septic shock Circulatory shock, mix of cardiogenic and sepsis off pressors as of 6/15 Cardiorespiratory arrest 6/13 Assessment & Plan:   Status post small bowel resection, lysis of adhesions, status post reexploration on 6/14 with creation of side-to-side functional end-to-end small bowel anastomosis and closure of abdominal wall -He still has significant generalized anasarca complicated further by protein calorie malnutrition -diflucan discontinued on 6/10 due to rising liver function tests Plan  Wound management per surgical services I think we need to start/resume TNA, will repeat LFTs if normalized on 6/16 will order Day #9 of 10 Zosyn Dc stress dose steroids  Acute hypoxic respiratory failure in setting of peritonitis Portable chest x-ray personally reviewed: This demonstrates endotracheal tube in satisfactory  position, the right IJ dialysis catheter as well as the right subclavian triple-lumen catheter also in satisfactory position.  There are bilateral pleural effusions.  Likely an element of atelectasis this does reflect worsening in aeration when comparing film from 6/13 -Would like to get more volume off, not ready for extubation due to volume status as well as mental status, she is still over 10 L positive Plan Initiating pressure support ventilation with daily assessment for spontaneous breathing trial Discontinuing sedation infusion, starting PRN fentanyl Volume removal with CRRT VAP bundle A.m. 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  Suspect this is multifactorial secondary to delayed clearance from renal dysfunction of sedating medications also resolving sepsis  Plan Discontinue fentanyl drip Changed to PRN Would consider Precedex if indicated RASS goal 0 to -1  Type 2 diabetes Excellent glycemic control currently Plan Holding metformin Continuing sliding scale insulin  Thrombocytopenia from sepsis Platelet count improved/stabilized over the last 48 hours Plan Continue to hold anticoagulation, once platelet count greater than 50,000 we should add back prophylactic anticoagulation PAS hose A.m. CBC  Shock liver, improving Plan Repeat LFTs in morning  Summary -multiorgan failure related to bowel perforation and septic shock, now improving gradually  Best practice:  Diet: N.p.o. DVT prophylaxis: SCDs GI prophylaxis: Protonix Mobility: Bedrest Code Status: Full code  Disposition: Stable.  From a pulmonary critical care standpoint she is ready to initiate weaning, I think the primary obstacles to successful extubation at this point are as follows: Volume status, she is 10 L positive also encephalopathy.  She is not at the point where she can protect airway adequately.  Her goals for today are to initiate volume removal with CRRT, need to address nutritional goals, I suspect she should be on TNA, spoke with surgery, this was delayed due to elevated LFTs.  We will recheck these in the morning.  And finally in addition to other supportive measures will discontinue fentanyl drip and change to as needed  My critical care time is 35 minutes  Erick Colace ACNP-BC [place] Pager # [phone] OR # [phone] if no answer  11/29/2018

## 2018-11-29 NOTE — Progress Notes (Signed)
Nutrition Follow-up  DOCUMENTATION CODES:   Not applicable  INTERVENTION:  - TPN initiation when medically feasible.   NUTRITION DIAGNOSIS:   Inadequate oral intake related to inability to eat as evidenced by NPO status. -ongoing  GOAL:   Patient will meet greater than or equal to 90% of their needs -unmet/unable to meet at this time  MONITOR:   Vent status, Labs, Weight trends, Skin, I & O's, Other (Comment)(initiation of nutrition support)  ASSESSMENT:   63 year old female with past medical history of type 2 DM, hyperlipidemia, HTN, inflammatory R breast cancer s/p radiation in 2015. Patient presented to the ED on 6/7 with severe abdominal pain, N/V. CT abdomen/pelvis which showed perforated small bowel with large amount of free air and multiple incarcerated incisional hernias.   Significant Events: 6/7- admission; ex lap, small bowel resection, LOAs, and placement of wound vac; NGT placement 6/9- CRRT initiation; return to OR for re-opening of open abdomen with washout 6/14- bowel anastomosis and closure of abdominal wall   Re-estimated needs based on admission weight of 74.8 kg. RN flow sheet indicates deep pitting edema to BUE and moderate pitting edema to BLE. Patient remains intubated with NGT to LIS with ~425 ml output in canister. RN reports 25 ml from this shift. Patient remains on CRRT. She has triple lumen R subclavian CVC. Able to talk with Laurey Arrow earlier this AM and he reports plan is to recheck labs in the AM on 6/16 and if LFTs have continued to trend down, then will start TPN at that time. Patient's abdomen now closed.    Per notes: s/p small bowel resection, LOAs, re-exploration, s/p abdominal wall closure, acute hypoxic respiratory failure in setting of peritonitis, possible atelectasis, not ready for extubation at this time d/t mentation and volume overload though weaned off pressor support and sedating medications, AKI d/t tubular necrosis on CRRT, acute metabolic  encephalopathy, shock liver--improving.   Patient is currently intubated on ventilator support MV: 8.4 L/min Temp (24hrs), Avg:98.4 F (36.9 C), Min:97 F (36.1 C), Max:99.3 F (37.4 C) Propofol: none BP: 168/71 and MAP: 106  Medications reviewed; sliding scale novolog. Labs reviewed; CBGs: 136 and 129 mg/dl today, BUN: 42 mg/dl, creatinine: 2.03 mg/dl, Ca: 6.3 mg/dl, Mg: 2.7 mg/dl, GFR: 29 ml/min. LFTs trending down from 6/13-6/14; not checked this AM but planned to check 6/16 AM.    Diet Order:   Diet Order    None      EDUCATION NEEDS:   No education needs have been identified at this time  Skin:  Skin Assessment: Skin Integrity Issues: Skin Integrity Issues:: Stage II Stage II: L ear Wound Vac: abdomen Incisions: abdomen (6/7, 6/9, 6/13)  Last BM:  6/10  Height:   Ht Readings from Last 1 Encounters:  11/26/18 5\' 4"  (1.626 m)    Weight:   Wt Readings from Last 1 Encounters:  11/29/18 90 kg    Ideal Body Weight:  54.5 kg  BMI:  Body mass index is 34.06 kg/m.  Estimated Nutritional Needs:   Kcal:  1530 kcal  Protein:  172-187 grams (2.3-2.5 grams/kg)  Fluid:  >/= 1.8 L/day     Jarome Matin, MS, RD, LDN, [facility] Inpatient Clinical Dietitian Pager # [phone] After hours/weekend pager # [phone]

## 2018-11-29 NOTE — Progress Notes (Signed)
250 mL of fentaNYL (SUBLIMAZE) (10 mcg/mL) infusion wasted in Stericycle. Witnessed by Brynda Greathouse, RN.

## 2018-11-29 NOTE — Progress Notes (Signed)
63 year old hypertensive, diabetic who presented 6/7 with perforated small bowel from closed-loop obstruction and underwent exploratory laparotomy emergently, she had an open abdomen and underwent anastomosis with closure of abdominal wall on 6/14. She developed multiorgan failure and has remained intubated although she has made some progress in the last 24 hours and has come off pressors. She remains on CRRT  On exam-sedated, intubated, anasarca, soft distended abdomen, nontender, decreased breath sounds bilateral minimal secretions, S1-S2 regular  Chest x-ray personally reviewed which shows bilateral lower lobe infiltrates and effusions.  Labs show improving LFTs and very low albumin of 1.4, mild hyperkalemia and extreme leukocytosis  ImPression/plan  Septic shock is resolving, she is off pressors this morning Zosyn will be continued. EF of 20% is presumed Takotsubo cardiomyopathy  Acute respiratory failure -start spontaneous breathing trials but would like significant negative balance prior to extubation  AKI -continue CRRT, increase ultrafiltrate 200 cc an hour if tolerated hemodynamically  Shock liver is improving and we should start TNA for moderate protein calorie malnutrition. Fentanyl drip has been discontinued and we will use intermittent fentanyl and Precedex if needed with goal RA SS 0 to -1 for weaning Expect thrombocytopenia to improve  The patient is critically ill with multiple organ systems failure and requires high complexity decision making for assessment and support, frequent evaluation and titration of therapies, application of advanced monitoring technologies and extensive interpretation of multiple databases. Critical Care Time devoted to patient care services described in this note independent of APP/resident  time is 35 minutes.   Leanna Sato Elsworth Soho MD

## 2018-11-29 NOTE — Progress Notes (Signed)
Placed PT back on full support on vent- due to increase wob (PT wean 10/5 for approximately 7 hours). RN aware PT back on full support. VT 430 RR20 Fi02 30% PEEP 5  PIP 23 MAP  10 PPlat 22 Total RR 23 (Spontaneous 3) Ti 0.9 I:E 1:1.7 MVe 9.8  VTe 499  ETT 22cm lip (repositioned to PT right) Low RR alarm 16 PT suctioned small ping tinged thick- tolerated well. HR 101 Sp02 97%  * This entry placed in notes due to Epic issue*

## 2018-11-30 ENCOUNTER — Inpatient Hospital Stay (HOSPITAL_COMMUNITY): Payer: BLUE CROSS/BLUE SHIELD

## 2018-11-30 LAB — CBC
HCT: 26.5 % — ABNORMAL LOW (ref 36.0–46.0)
Hemoglobin: 9 g/dL — ABNORMAL LOW (ref 12.0–15.0)
MCH: 30.3 pg (ref 26.0–34.0)
MCHC: 34 g/dL (ref 30.0–36.0)
MCV: 89.2 fL (ref 80.0–100.0)
Platelets: 49 10*3/uL — ABNORMAL LOW (ref 150–400)
RBC: 2.97 MIL/uL — ABNORMAL LOW (ref 3.87–5.11)
RDW: 15.7 % — ABNORMAL HIGH (ref 11.5–15.5)
WBC: 23.4 10*3/uL — ABNORMAL HIGH (ref 4.0–10.5)
nRBC: 0.4 % — ABNORMAL HIGH (ref 0.0–0.2)

## 2018-11-30 LAB — RENAL FUNCTION PANEL
Albumin: 1.6 g/dL — ABNORMAL LOW (ref 3.5–5.0)
Albumin: 1.7 g/dL — ABNORMAL LOW (ref 3.5–5.0)
Albumin: 1.6 g/dL — ABNORMAL LOW (ref 3.5–5.0)
Anion gap: 11 (ref 5–15)
Anion gap: 15 (ref 5–15)
Anion gap: 13 (ref 5–15)
BUN: 37 mg/dL — ABNORMAL HIGH (ref 8–23)
BUN: 31 mg/dL — ABNORMAL HIGH (ref 8–23)
BUN: 28 mg/dL — ABNORMAL HIGH (ref 8–23)
CO2: 23 mmol/L (ref 22–32)
CO2: 21 mmol/L — ABNORMAL LOW (ref 22–32)
CO2: 21 mmol/L — ABNORMAL LOW (ref 22–32)
Calcium: 6.8 mg/dL — ABNORMAL LOW (ref 8.9–10.3)
Calcium: 6.7 mg/dL — ABNORMAL LOW (ref 8.9–10.3)
Calcium: 6.7 mg/dL — ABNORMAL LOW (ref 8.9–10.3)
Chloride: 105 mmol/L (ref 98–111)
Chloride: 103 mmol/L (ref 98–111)
Chloride: 104 mmol/L (ref 98–111)
Creatinine, Ser: 1.58 mg/dL — ABNORMAL HIGH (ref 0.44–1.00)
Creatinine, Ser: 1.44 mg/dL — ABNORMAL HIGH (ref 0.44–1.00)
Creatinine, Ser: 1.39 mg/dL — ABNORMAL HIGH (ref 0.44–1.00)
GFR calc Af Amer: 40 mL/min — ABNORMAL LOW (ref >60)
GFR calc Af Amer: 44 mL/min — ABNORMAL LOW (ref >60)
GFR calc Af Amer: 46 mL/min — ABNORMAL LOW (ref >60)
GFR calc non Af Amer: 34 mL/min — ABNORMAL LOW (ref >60)
GFR calc non Af Amer: 38 mL/min — ABNORMAL LOW (ref >60)
GFR calc non Af Amer: 40 mL/min — ABNORMAL LOW (ref >60)
Glucose, Bld: 137 mg/dL — ABNORMAL HIGH (ref 70–99)
Glucose, Bld: 133 mg/dL — ABNORMAL HIGH (ref 70–99)
Glucose, Bld: 135 mg/dL — ABNORMAL HIGH (ref 70–99)
Phosphorus: 3.2 mg/dL (ref 2.5–4.6)
Phosphorus: 2.7 mg/dL (ref 2.5–4.6)
Phosphorus: 2.9 mg/dL (ref 2.5–4.6)
Potassium: 5.6 mmol/L — ABNORMAL HIGH (ref 3.5–5.1)
Potassium: 4.8 mmol/L (ref 3.5–5.1)
Potassium: 5.9 mmol/L — ABNORMAL HIGH (ref 3.5–5.1)
Sodium: 139 mmol/L (ref 135–145)
Sodium: 139 mmol/L (ref 135–145)
Sodium: 138 mmol/L (ref 135–145)

## 2018-11-30 LAB — HEPATIC FUNCTION PANEL
ALT: 280 U/L — ABNORMAL HIGH (ref 0–44)
AST: 81 U/L — ABNORMAL HIGH (ref 15–41)
Albumin: 1.6 g/dL — ABNORMAL LOW (ref 3.5–5.0)
Alkaline Phosphatase: 566 U/L — ABNORMAL HIGH (ref 38–126)
Bilirubin, Direct: 1.1 mg/dL — ABNORMAL HIGH (ref 0.0–0.2)
Indirect Bilirubin: 1.6 mg/dL — ABNORMAL HIGH (ref 0.3–0.9)
Total Bilirubin: 2.7 mg/dL — ABNORMAL HIGH (ref 0.3–1.2)
Total Protein: 5.2 g/dL — ABNORMAL LOW (ref 6.5–8.1)

## 2018-11-30 LAB — MAGNESIUM: Magnesium: 2.8 mg/dL — ABNORMAL HIGH (ref 1.7–2.4)

## 2018-11-30 LAB — GLUCOSE, CAPILLARY
Glucose-Capillary: 130 mg/dL — ABNORMAL HIGH (ref 70–99)
Glucose-Capillary: 115 mg/dL — ABNORMAL HIGH (ref 70–99)
Glucose-Capillary: 122 mg/dL — ABNORMAL HIGH (ref 70–99)
Glucose-Capillary: 77 mg/dL (ref 70–99)

## 2018-11-30 MED ORDER — HYDRALAZINE HCL 20 MG/ML IJ SOLN
20.0000 mg | INTRAMUSCULAR | Status: DC | PRN
  Administered 2018-11-30: 12:00:00 20 mg via INTRAVENOUS
  Filled 2018-11-30 (×2): qty 1

## 2018-11-30 MED ORDER — METOPROLOL TARTRATE 5 MG/5ML IV SOLN
5.0000 mg | Freq: Four times a day (QID) | INTRAVENOUS | Status: DC | PRN
  Administered 2018-11-30 – 2018-12-27 (×5): 5 mg via INTRAVENOUS
  Filled 2018-11-30 (×6): qty 5

## 2018-11-30 MED ORDER — STERILE WATER FOR INJECTION IV SOLN
INTRAVENOUS | Status: AC
  Administered 2018-11-30: 18:00:00 via INTRAVENOUS
  Filled 2018-11-30: qty 640

## 2018-11-30 MED ORDER — PRISMASOL BGK 0/2.5 32-2.5 MEQ/L IV SOLN
INTRAVENOUS | Status: DC
  Administered 2018-11-30 – 2018-12-02 (×11): via INTRAVENOUS_CENTRAL
  Filled 2018-11-30 (×18): qty 5000

## 2018-11-30 NOTE — Progress Notes (Addendum)
Patient ID: Carmen Stone, female   DOB: July 23, 1955, 63 y.o.   MRN: [id number]    2 Days Post-Op  Subjective: Patient moves her head and squints some, but doesn't follow commands.  Given some fentanyl to help with pain and BP over 200s.  No new issues noted overnight.  Objective: Vital signs in last 24 hours: Temp:  [97.7 F (36.5 C)-98.8 F (37.1 C)] 97.9 F (36.6 C) (06/16 0840) Pulse Rate:  [88-111] 92 (06/16 0840) Resp:  [0-26] 16 (06/16 0840) BP: (99-207)/(44-106) 196/74 (06/16 0840) SpO2:  [97 %-100 %] 98 % (06/16 0840) FiO2 (%):  [30 %] 30 % (06/16 0840) Weight:  [88.1 kg] 88.1 kg (06/16 0321) Last BM Date: 11/24/18  Intake/Output from previous day: 06/15 0701 - 06/16 0700 In: 508.9 [I.V.:309; IV Piggyback:199.9] Out: 4254 [Emesis/NG output:200] Intake/Output this shift: Total I/O In: 10 [I.V.:10] Out: 211 [Other:211]  PE: Gen: critically ill on vent Heart: regular Lungs: CTAB, on vent.  Central line in [place], HD cath in right neck.   Abd: soft, doesn't grimace much to palpation to abdomen, midline wound is stable with some serous drainage on her ABD, hypoactive BS, NGT with bilious output but only documented 200cc since yesterday. Ext: good pulses in all extremities  Lab Results:  Recent Labs    11/29/18 0206 11/30/18 0321  WBC 19.6* 23.4*  HGB 8.0* 9.0*  HCT 24.8* 26.5*  PLT 35* 49*   BMET Recent Labs    11/29/18 1600 11/30/18 0321  NA 140 139  K 5.0 5.6*  CL 105 105  CO2 24 23  GLUCOSE 134* 137*  BUN 36* 37*  CREATININE 1.74* 1.58*  CALCIUM 6.8* 6.8*   PT/INR No results for input(s): LABPROT, INR in the last 72 hours. CMP     Component Value Date/Time   NA 139 11/30/2018 0321   NA 138 06/01/2017 0859   K 5.6 (H) 11/30/2018 0321   K 4.2 06/01/2017 0859   CL 105 11/30/2018 0321   CO2 23 11/30/2018 0321   CO2 24 06/01/2017 0859   GLUCOSE 137 (H) 11/30/2018 0321   GLUCOSE 118 06/01/2017 0859   BUN 37 (H) 11/30/2018 0321   BUN 15.2  06/01/2017 0859   CREATININE 1.58 (H) 11/30/2018 0321   CREATININE 0.79 06/02/2018 0856   CREATININE 0.7 06/01/2017 0859   CALCIUM 6.8 (L) 11/30/2018 0321   CALCIUM 9.0 06/01/2017 0859   PROT 5.2 (L) 11/30/2018 0321   PROT 7.0 06/01/2017 0859   ALBUMIN 1.6 (L) 11/30/2018 0321   ALBUMIN 1.6 (L) 11/30/2018 0321   ALBUMIN 3.7 06/01/2017 0859   AST 81 (H) 11/30/2018 0321   AST 13 (L) 06/02/2018 0856   AST 14 06/01/2017 0859   ALT 280 (H) 11/30/2018 0321   ALT 16 06/02/2018 0856   ALT 12 06/01/2017 0859   ALKPHOS 566 (H) 11/30/2018 0321   ALKPHOS 97 06/01/2017 0859   BILITOT 2.7 (H) 11/30/2018 0321   BILITOT 0.3 06/02/2018 0856   BILITOT 0.22 06/01/2017 0859   GFRNONAA 34 (L) 11/30/2018 0321   GFRNONAA >60 06/02/2018 0856   GFRAA 40 (L) 11/30/2018 0321   GFRAA >60 06/02/2018 0856   Lipase     Component Value Date/Time   LIPASE 24 11/24/2018 0355       Studies/Results: Dg Chest Port 1 View  Result Date: 11/30/2018 CLINICAL DATA:  Acute respiratory failure.  Pleural effusions. EXAM: PORTABLE CHEST 1 VIEW COMPARISON:  12/09/2018 and 11/29/2018 FINDINGS: Endotracheal tube is 4.4  cm above the carina. Central venous catheter and Port-A-Cath tips are above the cavoatrial junction in the superior vena cava. NG tube tip is below the diaphragm. There are persistent small bilateral pleural effusions. Heart size is normal. Pulmonary vascularity is improved. IMPRESSION: Persistent small bilateral pleural effusions. Improved pulmonary vascular congestion. Electronically Signed   By: Lorriane Shire M.D.   On: 11/30/2018 08:19   Dg Chest Port 1 View  Result Date: 11/29/2018 CLINICAL DATA:  Acute respiratory failure. EXAM: PORTABLE CHEST 1 VIEW COMPARISON:  12/14/2018. FINDINGS: Endotracheal tube, NG tube, right IJ line, right subclavian line, left PowerPort catheter in stable position. Heart size stable. Diffuse bilateral interstitial prominence suggesting interstitial edema noted. Findings  have worsened from prior exam. Pneumonitis cannot be excluded. Progressive bilateral pleural effusions. These findings again suggest CHF. No pneumothorax. No acute bony abnormality. Degenerative change thoracic spine. Surgical clips right axilla. IMPRESSION: 1.  Lines and tubes stable position. 2. Findings most consistent with CHF with bilateral pulmonary interstitial edema and bilateral pleural effusions. Findings have worsened from prior exam. Electronically Signed   By: Hillman   On: 11/29/2018 08:00    Anti-infectives: Anti-infectives (From admission, onward)   Start     Dose/Rate Route Frequency Ordered Stop   12/12/2018 1800  fluconazole (DIFLUCAN) IVPB 200 mg  Status:  Discontinued     200 mg 100 mL/hr over 60 Minutes Intravenous Every 24 hours 12/01/2018 0748 11/22/2018 0847   11/17/2018 1800  fluconazole (DIFLUCAN) IVPB 400 mg  Status:  Discontinued     400 mg 100 mL/hr over 120 Minutes Intravenous Every 24 hours 12/09/2018 0847 11/24/2018 0856   12/08/2018 1400  piperacillin-tazobactam (ZOSYN) IVPB 2.25 g     2.25 g 100 mL/hr over 30 Minutes Intravenous Every 6 hours 11/29/2018 0748     11/24/2018 0600  cefoTEtan (CEFOTAN) 2 g in sodium chloride 0.9 % 100 mL IVPB     2 g 200 mL/hr over 30 Minutes Intravenous On call to O.R. 11/22/18 1300 11/16/2018 0624   11/22/18 1800  fluconazole (DIFLUCAN) IVPB 400 mg  Status:  Discontinued     400 mg 100 mL/hr over 120 Minutes Intravenous Every 24 hours 11/26/2018 1528 11/20/2018 0748   11/22/18 1800  vancomycin (VANCOCIN) 1,500 mg in sodium chloride 0.9 % 500 mL IVPB  Status:  Discontinued     1,500 mg 250 mL/hr over 120 Minutes Intravenous Every 24 hours 11/22/18 0358 11/22/18 0750   11/22/18 0115  vancomycin (VANCOCIN) 1,500 mg in sodium chloride 0.9 % 500 mL IVPB     1,500 mg 250 mL/hr over 120 Minutes Intravenous  Once 11/22/18 0101 11/22/18 0319   12/05/2018 1630  fluconazole (DIFLUCAN) IVPB 800 mg     800 mg 100 mL/hr over 240 Minutes Intravenous   Once 12/11/2018 1527 11/29/2018 2013   11/24/2018 1600  piperacillin-tazobactam (ZOSYN) IVPB 3.375 g  Status:  Discontinued     3.375 g 12.5 mL/hr over 240 Minutes Intravenous Every 8 hours 11/24/2018 1522 11/18/2018 0748   12/04/2018 0545  piperacillin-tazobactam (ZOSYN) IVPB 3.375 g     3.375 g 100 mL/hr over 30 Minutes Intravenous  Once 11/24/2018 0530 11/16/2018 0626   12/05/2018 0530  piperacillin-tazobactam (ZOSYN) IVPB 4.5 g  Status:  Discontinued     4.5 g 200 mL/hr over 30 Minutes Intravenous  Once 12/08/2018 0517 11/16/2018 0529       Assessment/Plan HTN HLD DM Hypothyroidism 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 Ninfa Linden 2. S/pReexploration of recent laparotomyWITH [facility]9 Dr. Rosendo Gros 3. S/pExploratory laparotomy with washout of abdomen and placement of vacuum VAC dressing6/11 Dr. Brantley Stage 4. Exploratory laparotomy with anastomosis of SB and closure of abdominal wall 6/14 Dr. Brantley Stage -cont NGT and await ileus resolution -will start half dose TNA today to get some nutrition started as feeding her gut is too tenuous right now given fresh anastomosis. -zosyn D9 -cont BID NSWD dressing changes to midline wound -trend CMET over the next couple of days to see when we can increase to full dose TNA -WBC up today but no fevers.  Suspect this may still be reactive from surgery.  Will continue to follow for now.  No change in ABX therapy at this time from an abdominal standpoint  ID -zosyn 6/7>>, diflucan 6/7>>6/11, vancomycin 6/8>>6/8 FEN -IVF, NPO/NGT. Start half  dose TNA today VTE -SCDs, lovenox on hold for thrombocytopenia Foley -in place Contact - brother GeorgeMattier([phone] or [phone])   LOS: 9 days    [name] , [facility] Surgery 11/30/2018, 8:53 AM Pager: [phone]

## 2018-11-30 NOTE — Progress Notes (Signed)
[facility] Progress Note  Subjective: Bps high, pulling net 200 mL/ hr.   Running well.    Vitals:   11/30/18 1000 11/30/18 1100 11/30/18 1127 11/30/18 1133  BP: (!) 169/83 (!) 173/98 (!) 173/98 (!) 181/85  Pulse: (!) 110 97 89 85  Resp: (!) 26 (!) 23 (!) 25 (!) 26  Temp: 98.1 F (36.7 C) 97.9 F (36.6 C)  97.7 F (36.5 C)  TempSrc:      SpO2: 100% 100% 100% 99%  Weight:      Height:        Inpatient medications: . sodium chloride   Intravenous Once  . sodium chloride   Intravenous Once  . chlorhexidine gluconate (MEDLINE KIT)  15 mL Mouth Rinse BID  . Chlorhexidine Gluconate Cloth  6 each Topical Daily  . dorzolamide-timolol  1 drop Both Eyes BID  . insulin aspart  3-9 Units Subcutaneous Q4H  . mouth rinse  15 mL Mouth Rinse 10 times per day  . pantoprazole (PROTONIX) IV  40 mg Intravenous Q24H   .  prismasol BGK 4/2.5 400 mL/hr at 11/29/18 1000  .  prismasol BGK 4/2.5 200 mL/hr at 11/29/18 1113  . sodium chloride 10 mL/hr at 11/30/18 1100  . norepinephrine (LEVOPHED) Adult infusion Stopped (11/29/18 0800)  . piperacillin-tazobactam (ZOSYN)  IV Stopped (11/30/18 0844)  . prismasol BGK 4/2.5 1,750 mL/hr at 11/30/18 0911  . TPN ADULT (ION)     sodium chloride, acetaminophen, albuterol, alteplase, fentaNYL (SUBLIMAZE) injection, fentaNYL (SUBLIMAZE) injection, heparin, hydrALAZINE, metoprolol tartrate, sodium chloride, sodium chloride flush    Exam: Gen on vent, mildly agitated, not following commands NECK: + JVD to angle of mandible  PULM: coarse mech breath sounds bilaterally CV: RRR no MRG ABD: soft, abd wall edema, dressings c/d/i GU foley cath minimal urine Ext diffuse LE/ UE 2+ edema Neuro agitated intermittently   Assessment: 1. Acute renal failure - in setting of septic shock due to perf bowel, suspected ATN. SP CRRT 6/9 - 6/12, held after PEA arrest.  No UOP. Resumed 6/14 after abdominal closure, doing well.  Off pressors, pulling 200 mL/  hr.   All 4K, no heparin.  At least 10 kg vol up.  Corrected calcium is 8.5.  Repeating K today as slightly high   2. SP PEA arrest: on 6/13 in ICU, brief arrest  3. Acute hypoxic respiratory failure: weaning as able, per PCCM 4. Septic shock: on Zosyn, s/p fluconazole 5. Perforated small bowel due to closed-loop SBO / septic shock- per CCM/ gen surg, s/p closure 6/14. 6. DM on insulin 7. Severe hypoalbuminemia: per primary 8. Dispo: remains critically ill in ICU   [place] pgr [phone] 11/30/2018, 11:52 AM  Iron/TIBC/Ferritin/ %Sat No results found for: IRON, TIBC, FERRITIN, IRONPCTSAT Recent Labs  Lab 12/12/2018 0327  11/30/18 0321  NA 139   < > 139  K 4.3   < > 5.6*  CL 105   < > 105  CO2 17*   < > 23  GLUCOSE 171*   < > 137*  BUN 27*   < > 37*  CREATININE 2.09*   < > 1.58*  CALCIUM 6.2*   < > 6.8*  PHOS 3.4   < > 3.2  ALBUMIN 1.5*   < > 1.6*  1.6*  INR 1.6*  --   --    < > = values in this interval not displayed.   Recent Labs  Lab 11/30/18 0321  AST 81*  ALT 280*  ALKPHOS 566*  BILITOT 2.7*  PROT 5.2*   Recent Labs  Lab 11/30/18 0321  WBC 23.4*  HGB 9.0*  HCT 26.5*  PLT 49*

## 2018-11-30 NOTE — Progress Notes (Signed)
[facility] NOTE   Pharmacy Consult for TPN Indication: prolonged ileus  Patient Measurements: Height: 5\' 4"  (162.6 cm) Weight: 194 lb 3.6 oz (88.1 kg) IBW/kg (Calculated) : 54.7 TPN AdjBW (KG): 59.7 Body mass index is 33.34 kg/m. Usual Weight: 90kg  Insulin Requirements: 12 units in past 24 hours  Current Nutrition: NPO  IVF: none  Central access: 11/26/2018 TPN start date: 11/30/18  ASSESSMENT                                                                                                          HPI: She presented 6/7 with perforated small bowel from closed-loop obstruction and underwent emergent exploratory laparotomy.  She returned to OR on 6/9 and again on 6/14 for closure of abdominal wall. She coded on 28-Nov-2022 and developed multiorgan failure.  She remains intubated & on CRRT.  Pressors have been weaned off and she is completing antibiotic course for sepsis/PNA.   She has been NPO since admission on 6/7.  Pharmacy consulted to start TPN.  Acute liver injury is improving but will initiate TPN at low rate & advance slowly as requested by surgery.    Significant events:   Today:   Glucose - Hx DM on 70/30 Novolog 20 units BID PTA.  CBGs at goal <150 on ICU resistant scale (range 115-140).  Electrolytes - K elevated at 5.6, Mag elevated at 2.8, CorrCa 8.72, Na and Phos WNL.   Renal - AKI on CRRT  LFTs - Acute liver injury- AST/ALT elevated but trending down (280/566).  TBili 2.7  TGs - 85 (6/9)  Prealbumin - pending  NUTRITIONAL GOALS                                                                                             RD recs:  Kcal:  1530 kcal, Protein:  172-187 grams (2.3-2.5 grams/kg), Fluid:  >/= 1.8 L/day  Custom TPN at goal rate of 75 ml/hr (NO LIPIDS x7 days) to provide: 180 g/day protein, 1515 Kcal/day.  PLAN  At 1800 today:  Start high protein, hypocaloric TPN per recommendations for obese, critically ill patient.  Initiate custom TPN @ 40 ml/hr.  Electrolytes in TPN as follows:  Na 37mEq/L, Phos 5Mmol/L, Ca 20mEq/L, NO K or Mag.  Max acetate.  Hold 20% lipid emulsion for first 7 days for ICU patients per ASPEN guidelines (Start date 6/23)  Plan to advance as tolerated to the goal rate.  TPN to contain standard multivitamins.  Add trace elements on MWF only due to national backorder.  MIVF per MD  Continue resistant ICU glycemic control orders q4h.   TPN lab panels tomorrow then on Mondays & Thursdays.  F/u daily.  Carmen Stone, Carmen Stone 11/30/2018,7:22 AM

## 2018-11-30 NOTE — Progress Notes (Signed)
NAME:  Carmen Stone, MRN:  [id number], DOB:  10-Apr-1956, LOS: 9 ADMISSION DATE:  11/20/2018, CONSULTATION DATE:  11/21/2018 REFERRING MD:  Dr. Ninfa Linden, Surgery, CHIEF COMPLAINT:  Abdominal pain   Brief History   62 yo female presented to ER with nausea, vomiting, abdominal pain.  CT abd/pelvis showed focal perforated loop of SB with free air with multiple complex ventral hernias.  Had emergent laparotomy with SB resection, lysis of adhesions and wound vac.  Remained on vent and pressors post op.  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  Changing volume goal on CRRT to -100 cc an hour.  Starting to wean.  Fentanyl infusion stopped changed to PRN.  7 hours weaning 6/16 remains encephalopathic.  Requiring intermittent fentanyl.  Tolerating clears to 200 cc negative fluid balance hourly.  She is hypertensive, adding labetalol.  IV TNA started Consults:  Renal 6/09 AKI, acidosis Cardiology 6/09 Takotsubo CM   Procedures:  ETT 6/07 >> Rt IJ HD 6/09 >> Rt [place] CVL 6/09 >> Lt radial aline 6/09 >>  Significant Diagnostic Tests:  CT abd/pelvis 6/07 >> focal perforated loop of SB with free air with multiple complex ventral hernias Echo 6/08 >> EF 20%, Takotsubo CM  Micro Data:  COVID 6/07 >> negative  Antimicrobials:  Zosyn 6/07 >> Diflucan 6/07 -6/10  Interim history/subjective:   Remains encephalopathic.  Tolerating spontaneous breathing trial   Objective   Blood pressure (Abnormal)  207/90, pulse (Abnormal) 108, temperature 97.7 F (36.5 C), resp. rate 19, height 5\' 4"  (1.626 m), weight 88.1 kg, SpO2 98 %. CVP:  [3 mmHg-7 mmHg] 4 mmHg  Vent Mode: PRVC FiO2 (%):  [30 %] 30 % Set Rate:  [20 bmp] 20 bmp Vt Set:  [430 mL] 430 mL PEEP:  [5 cmH20] 5 cmH20 Pressure Support:  [10 cmH20] 10 cmH20 Plateau Pressure:  [20 cmH20-21 cmH20] 21 cmH20   Intake/Output Summary (Last 24 hours) at 11/30/2018 5638 Last data filed at 11/30/2018 0800 Gross per 24 hour  Intake 492.72 ml  Output 4448 ml  Net -3955.28 ml   Filed Weights   12/08/2018 0400 11/29/18 0317 11/30/18 0321  Weight: 89.5 kg 90 kg 88.1 kg    Examination:  General 63 year old black woman currently on pressure support ventilation of 10 cm water without acute distress he does shake her head back and forth from time to time triggering high pressure alarm on dialysis catheter however she is not following commands HEENT normocephalic atraumatic orally intubated no JVD mucous membranes are moist Pulmonary: Clear to auscultation no accessory use on pressure support ventilation diminished in the bases bilaterally Cardiac: Regular rate and rhythm without audible murmur rub or gallop Abdomen: Remains swollen.  Midabdominal dressing intact, hypoactive. GU: Minimal urine output with Foley catheter in place currently on CRRT Extremities: Continues to have diffuse anasarca with weeping extremities.  At this point she remains 6.6 L positive Neuro: Eyes were open intermittently to verbal request, she does not follow commands.  Shakes head back-and-forth, not moving extremities currently.  Resolved Hospital Problem list   Metabolic alkalosis Abdominal peritonitis with septic shock Circulatory shock, mix of cardiogenic and sepsis off pressors as of 6/15 Cardiorespiratory arrest 6/13 Assessment & Plan:   Status post small bowel resection, lysis of adhesions, status post reexploration on 6/14 with creation of side-to-side  functional end-to-end small bowel anastomosis and closure of abdominal wall -He still has significant generalized anasarca complicated further by protein calorie malnutrition  Plan Continuing with management per surgical services  Starting TNA today  Day # 10/10 zosyn  Acute hypoxic respiratory failure in setting of peritonitis Portable chest x-ray personally reviewed:Support tubes and lines in satisfactory position there are persistent bilateral basilar effusions/volume loss no significant change when comparing serial films from day prior -Tolerating pressure support ventilation however mental status and volume status remains barrier to extubation today Plan Continuing pressure support ventilation with daily assessment for spontaneous breathing trial Agree with escalation of volume removal, aiming for a -200 mL an hour VAP bundle Attempting to minimize sedation for now we will continue just as needed low-dose fentanyl, if agitation becomes a true issue we will add Precedex  Acute systolic congestive heart failure with ejection fraction of 20%, 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  Suspect this is multifactorial secondary to delayed clearance from renal dysfunction of sedating medications also resolving sepsis  Plan RA SS goal 0 PRN fentanyl  Type 2 diabetes Excellent glycemic control currently Plan Continue to hold metformin Sliding scale insulin  Thrombocytopenia from sepsis Platelet count improved/stabilized over the last 32 hours Plan Continue with PAS hose A.m. CBC Will start prophylactic heparin once platelets greater than 50,000  Shock liver, improving Plan We will check  daily chemistries as resuming TNA  Summary -multiorgan failure related to bowel perforation and septic shock, now improving gradually  Best practice:  Diet: N.p.o. DVT prophylaxis: SCDs GI prophylaxis: Protonix Mobility: Bedrest Code Status: Full code Disposition: She remains stable.  Critical care issues requiring critical care support include: Titration of mechanical ventilation, observation of sedation regimen and titration of sedating medications, assessment of volume removal continuing to push for negative volume status.  We are starting TNA today.  I think her primary barriers to recovery are: Delirium/encephalopathy, volume overload, and protein calorie malnutrition.  We will continue care as outlined above  My critical care time is 32 minutes  Erick Colace ACNP-BC [place] Pager # [phone] OR # [phone] if no answer  11/30/2018

## 2018-11-30 NOTE — Progress Notes (Signed)
63 year old hypertensive, diabetic who presented 6/7 with perforated small bowel from closed-loop obstruction and underwent exploratory laparotomy emergently, she had an open abdomen and underwent anastomosis with closure of abdominal wall on 6/14. She developed multiorgan failure and required CRRT, has come off pressors  Remains unresponsive although weans well on pressure support 10/5, no purposeful movements and does not follow commands, has 1+ edema, S1-S2 tacky and mildly hypertensive, abdomen appears mildly distended with hypoactive bowel sounds.  Labs show mild hyperkalemia with improving LFTs and worsening leukocytosis, slightly improved platelet count.  Chest x-ray 6/16 personally reviewed which shows improved congestion pattern with decreased bilateral effusions.  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%-needs repeat echo at some point for EF recovery  Acute respiratory failure-doing well with spontaneous breathing trial but needs to be more awake to extubate  Acute encephalopathy-off sedation now, goal RA SS 0 with intermittent fentanyl alone and hopeful this will improve  AKI -continue CRRT with negative balance 200/hour if she tolerates hemodynamically, remains to be seen if she will have renal recovery Thrombocytopenia improving Protein calorie malnutrition-start TNA  Prognosis improving but would like mental status to improve before extubating  The patient is critically ill with multiple organ systems failure and requires high complexity decision making for assessment and support, frequent evaluation and titration of therapies, application of advanced monitoring technologies and extensive interpretation of multiple databases. Critical Care Time devoted to patient care services described in this note independent of APP/resident  time is 35 minutes.   Leanna Sato Elsworth Soho MD

## 2018-12-01 LAB — RENAL FUNCTION PANEL
Albumin: 1.7 g/dL — ABNORMAL LOW (ref 3.5–5.0)
Albumin: 1.7 g/dL — ABNORMAL LOW (ref 3.5–5.0)
Anion gap: 11 (ref 5–15)
Anion gap: 10 (ref 5–15)
BUN: 29 mg/dL — ABNORMAL HIGH (ref 8–23)
BUN: 33 mg/dL — ABNORMAL HIGH (ref 8–23)
CO2: 25 mmol/L (ref 22–32)
CO2: 24 mmol/L (ref 22–32)
Calcium: 7.3 mg/dL — ABNORMAL LOW (ref 8.9–10.3)
Calcium: 7.1 mg/dL — ABNORMAL LOW (ref 8.9–10.3)
Chloride: 102 mmol/L (ref 98–111)
Chloride: 102 mmol/L (ref 98–111)
Creatinine, Ser: 1.19 mg/dL — ABNORMAL HIGH (ref 0.44–1.00)
Creatinine, Ser: 1.27 mg/dL — ABNORMAL HIGH (ref 0.44–1.00)
GFR calc Af Amer: 56 mL/min — ABNORMAL LOW (ref >60)
GFR calc Af Amer: 52 mL/min — ABNORMAL LOW (ref >60)
GFR calc non Af Amer: 48 mL/min — ABNORMAL LOW (ref >60)
GFR calc non Af Amer: 45 mL/min — ABNORMAL LOW (ref >60)
Glucose, Bld: 201 mg/dL — ABNORMAL HIGH (ref 70–99)
Glucose, Bld: 238 mg/dL — ABNORMAL HIGH (ref 70–99)
Phosphorus: 2.4 mg/dL — ABNORMAL LOW (ref 2.5–4.6)
Phosphorus: 1.8 mg/dL — ABNORMAL LOW (ref 2.5–4.6)
Potassium: 4.2 mmol/L (ref 3.5–5.1)
Potassium: 3.9 mmol/L (ref 3.5–5.1)
Sodium: 138 mmol/L (ref 135–145)
Sodium: 136 mmol/L (ref 135–145)

## 2018-12-01 LAB — COMPREHENSIVE METABOLIC PANEL
ALT: 209 U/L — ABNORMAL HIGH (ref 0–44)
AST: 60 U/L — ABNORMAL HIGH (ref 15–41)
Albumin: 1.7 g/dL — ABNORMAL LOW (ref 3.5–5.0)
Alkaline Phosphatase: 482 U/L — ABNORMAL HIGH (ref 38–126)
Anion gap: 11 (ref 5–15)
BUN: 29 mg/dL — ABNORMAL HIGH (ref 8–23)
CO2: 25 mmol/L (ref 22–32)
Calcium: 7.2 mg/dL — ABNORMAL LOW (ref 8.9–10.3)
Chloride: 102 mmol/L (ref 98–111)
Creatinine, Ser: 1.27 mg/dL — ABNORMAL HIGH (ref 0.44–1.00)
GFR calc Af Amer: 52 mL/min — ABNORMAL LOW (ref >60)
GFR calc non Af Amer: 45 mL/min — ABNORMAL LOW (ref >60)
Glucose, Bld: 204 mg/dL — ABNORMAL HIGH (ref 70–99)
Potassium: 4.2 mmol/L (ref 3.5–5.1)
Sodium: 138 mmol/L (ref 135–145)
Total Bilirubin: 2 mg/dL — ABNORMAL HIGH (ref 0.3–1.2)
Total Protein: 5.6 g/dL — ABNORMAL LOW (ref 6.5–8.1)

## 2018-12-01 LAB — DIFFERENTIAL
Abs Immature Granulocytes: 0.47 10*3/uL — ABNORMAL HIGH (ref 0.00–0.07)
Basophils Absolute: 0.2 10*3/uL — ABNORMAL HIGH (ref 0.0–0.1)
Basophils Relative: 1 %
Eosinophils Absolute: 0 10*3/uL (ref 0.0–0.5)
Eosinophils Relative: 0 %
Immature Granulocytes: 2 %
Lymphocytes Relative: 3 %
Lymphs Abs: 0.8 10*3/uL (ref 0.7–4.0)
Monocytes Absolute: 0.3 10*3/uL (ref 0.1–1.0)
Monocytes Relative: 1 %
Neutro Abs: 21.8 10*3/uL — ABNORMAL HIGH (ref 1.7–7.7)
Neutrophils Relative %: 93 %

## 2018-12-01 LAB — CBC
HCT: 29.6 % — ABNORMAL LOW (ref 36.0–46.0)
Hemoglobin: 9.9 g/dL — ABNORMAL LOW (ref 12.0–15.0)
MCH: 29.8 pg (ref 26.0–34.0)
MCHC: 33.4 g/dL (ref 30.0–36.0)
MCV: 89.2 fL (ref 80.0–100.0)
Platelets: 67 10*3/uL — ABNORMAL LOW (ref 150–400)
RBC: 3.32 MIL/uL — ABNORMAL LOW (ref 3.87–5.11)
RDW: 15.5 % (ref 11.5–15.5)
WBC: 23.5 10*3/uL — ABNORMAL HIGH (ref 4.0–10.5)
nRBC: 0.3 % — ABNORMAL HIGH (ref 0.0–0.2)

## 2018-12-01 LAB — GLUCOSE, CAPILLARY
Glucose-Capillary: 201 mg/dL — ABNORMAL HIGH (ref 70–99)
Glucose-Capillary: 161 mg/dL — ABNORMAL HIGH (ref 70–99)
Glucose-Capillary: 176 mg/dL — ABNORMAL HIGH (ref 70–99)
Glucose-Capillary: 192 mg/dL — ABNORMAL HIGH (ref 70–99)
Glucose-Capillary: 214 mg/dL — ABNORMAL HIGH (ref 70–99)
Glucose-Capillary: 124 mg/dL — ABNORMAL HIGH (ref 70–99)
Glucose-Capillary: 248 mg/dL — ABNORMAL HIGH (ref 70–99)
Glucose-Capillary: 235 mg/dL — ABNORMAL HIGH (ref 70–99)
Glucose-Capillary: 205 mg/dL — ABNORMAL HIGH (ref 70–99)

## 2018-12-01 LAB — PREALBUMIN: Prealbumin: <5 mg/dL — ABNORMAL LOW (ref 18–38)

## 2018-12-01 LAB — TRIGLYCERIDES: Triglycerides: 159 mg/dL — ABNORMAL HIGH (ref <150)

## 2018-12-01 LAB — PHOSPHORUS: Phosphorus: 2.4 mg/dL — ABNORMAL LOW (ref 2.5–4.6)

## 2018-12-01 LAB — MAGNESIUM: Magnesium: 2.6 mg/dL — ABNORMAL HIGH (ref 1.7–2.4)

## 2018-12-01 MED ORDER — TRACE MINERALS CR-CU-MN-SE-ZN 10-1000-500-60 MCG/ML IV SOLN
INTRAVENOUS | Status: AC
  Administered 2018-12-01: 17:00:00 via INTRAVENOUS
  Filled 2018-12-01: qty 640

## 2018-12-01 MED ORDER — SODIUM CHLORIDE 0.9 % IV BOLUS
250.0000 mL | Freq: Once | INTRAVENOUS | Status: AC
  Administered 2018-12-01: 18:00:00 500 mL via INTRAVENOUS

## 2018-12-01 MED ORDER — FENTANYL CITRATE (PF) 100 MCG/2ML IJ SOLN
12.5000 ug | INTRAMUSCULAR | Status: AC | PRN
  Administered 2018-12-01: 12.5 ug via INTRAVENOUS
  Filled 2018-12-01: qty 2

## 2018-12-01 MED ORDER — HEPARIN SODIUM (PORCINE) 5000 UNIT/ML IJ SOLN
5000.0000 [IU] | Freq: Three times a day (TID) | INTRAMUSCULAR | Status: DC
  Administered 2018-12-01 – 2018-12-05 (×11): 5000 [IU] via SUBCUTANEOUS
  Filled 2018-12-01 (×12): qty 1

## 2018-12-01 NOTE — Progress Notes (Signed)
[facility] NOTE   Pharmacy Consult for TPN Indication: prolonged ileus  Patient Measurements: Height: 5\' 4"  (162.6 cm) Weight: 181 lb 14.1 oz (82.5 kg) IBW/kg (Calculated) : 54.7 TPN AdjBW (KG): 59.7 Body mass index is 31.22 kg/m. Usual Weight: 90kg  Insulin Requirements: 33 units in past 24 hours  Current Nutrition: NPO  IVF: none  Central access: 12/12/2018 TPN start date: 11/30/18  ASSESSMENT                                                                                                          HPI: She presented 6/7 with perforated small bowel from closed-loop obstruction and underwent emergent exploratory laparotomy.  She returned to OR on 6/9 and again on 6/14 for closure of abdominal wall. She coded on Dec 26, 2022 and developed multiorgan failure.  She remains intubated & on CRRT.  Pressors have been weaned off and she is completing antibiotic course for sepsis/PNA.   She has been NPO since admission on 6/7.  Pharmacy consulted to start TPN.  Acute liver injury is improving but will initiate TPN at low rate & advance slowly as requested by surgery.    Significant events:   Today:   Glucose - Hx DM on 70/30 Novolog 20 units BID PTA.  CBGs now above goal on ICU resistant scale since starting TPN (range 176-214).  Electrolytes - K 4.2, Mag elevated at 2.6, Phos low at 2.4, CorrCa & Na WNL.   Renal - AKI on CRRT  LFTs - Acute liver injury- AST/ALT elevated but trending down (60/209).  TBili 2.0  TGs - 85 (6/9)  Prealbumin - <5 (6/17)  NUTRITIONAL GOALS                                                                                             RD recs:  Kcal:  1530 kcal, Protein:  172-187 grams (2.3-2.5 grams/kg), Fluid:  >/= 1.8 L/day  Custom TPN at goal rate of 75 ml/hr (NO LIPIDS x7 days) to provide: 180 g/day protein, 1515 Kcal/day.  PLAN  At 1800 today:  Start high protein, hypocaloric TPN per recommendations for obese, critically ill patient.  Initiate custom TPN @ 40 ml/hr.  Electrolytes in TPN as follows:  Na 42mEq/L, Phos 5Mmol/L, Ca 4mEq/L, NO K or Mag.  Max acetate.  Hold 20% lipid emulsion for first 7 days for ICU patients per ASPEN guidelines (Start date 6/23)  Do not advance to the goal rate until LFTs improved per surgery request.  TPN to contain standard multivitamins.  Add trace elements on MWF only due to national backorder.  MIVF per MD- currently none  Continue resistant ICU glycemic control orders q4h.   Add 20 units/L of insulin to TPN  TPN lab panels tomorrow then on Mondays & Thursdays.  F/u daily.  Raine, [place] 12/01/2018,8:26 AM

## 2018-12-01 NOTE — Progress Notes (Signed)
Nephrologist aware of and agreed to keep even on CRRT. Will pass on in report to night shift.

## 2018-12-01 NOTE — Progress Notes (Signed)
Pt's BP 61/42 at 6pm, cuff repositioned, BP rechecked reading 70/46. MD made aware, new orders received to keep even on CRRT and give 250-500 mL NS bolus. Orders also received to restart levophed if no improvement after bolus. Nephrologist on call paged in regards to having to keep even, awaiting return call. Patient received 500 mL bolus with no improvement, levophed restarted. Last BP 95/49.  Will continue to monitor.

## 2018-12-01 NOTE — Progress Notes (Signed)
NAME:  Carmen Stone, MRN:  [id number], DOB:  04-Feb-1956, LOS: 74 ADMISSION DATE:  11/24/2018, CONSULTATION DATE:  11/21/2018 REFERRING MD:  Dr. Ninfa Linden, Surgery, CHIEF COMPLAINT:  Abdominal pain   Brief History   63 yo female presented to ER with nausea, vomiting, abdominal pain.  CT abd/pelvis showed focal perforated loop of SB with free air with multiple complex ventral hernias.  Had emergent laparotomy with SB resection, lysis of adhesions and wound vac.  Remained on vent and pressors post op.  Cardiorespiratory arrest 12/05/2018-lasted a couple of minutes  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, HTN, HLD, DM type II  Significant Hospital Events   6/07 Admit, to OR 6/08 transfuse FFP, on multiple pressors 6/09 To OR for wash out, start CRRT 6/10: Diflucan discontinued due to rising liver function tests 6/12-still on CRRT, no urine output 6/13-acute respiratory arrest overnight, off CRRT, on pressors, limit sedation 6/14 bowel anastomosis and closure of abdominal wall 6/15: Off pressors.  Changing volume goal on CRRT to -100 cc an hour.  Starting to wean.  Fentanyl infusion stopped changed to PRN.  7 hours weaning 6/16 remains encephalopathic.  Requiring intermittent fentanyl.  Tolerating clears to 200 cc negative fluid balance hourly.  She is hypertensive, adding labetalol.  IV TNA started 6/17: A little more awake.  Tolerating pressure support but SBI exceeds 105 on spontaneous breathing trial.  Continuing aggressive volume removal with CRRT, volume status now down to just around 2 L positive.  Decreasing PRN sedation further Consults:  Renal 6/09 AKI, acidosis Cardiology 6/09 Takotsubo CM   Procedures:  ETT 6/07 >> Rt IJ HD 6/09 >> Rt [place] CVL 6/09 >> Lt radial aline 6/09 >>  Significant Diagnostic Tests:  CT abd/pelvis 6/07 >> focal perforated loop of SB with free air with multiple complex ventral hernias Echo 6/08 >> EF 20%, Takotsubo  CM  Micro Data:  COVID 6/07 >> negative  Antimicrobials:  Zosyn 6/07 >> 6/17 Diflucan 6/07 -6/10  Interim history/subjective:   A little more awake no events overnight   Objective   Blood pressure 139/66, pulse (Abnormal) 109, temperature (Abnormal) 96.5 F (35.8 C), temperature source Axillary, resp. rate (Abnormal) 35, height 5\' 4"  (1.626 m), weight 82.5 kg, SpO2 98 %. CVP:  [0 mmHg-5 mmHg] 0 mmHg  Vent Mode: PSV;CPAP FiO2 (%):  [30 %] 30 % Set Rate:  [20 bmp] 20 bmp Vt Set:  [430 mL] 430 mL PEEP:  [5 cmH20] 5 cmH20 Pressure Support:  [10 cmH20] 10 cmH20 Plateau Pressure:  [20 cmH20-22 cmH20] 20 cmH20   Intake/Output Summary (Last 24 hours) at 12/01/2018 0835 Last data filed at 12/01/2018 0800 Gross per 24 hour  Intake 1078.26 ml  Output 5621 ml  Net -4542.74 ml   Filed Weights   11/30/18 0321 11/30/18 0756 12/01/18 0305  Weight: 88.1 kg 85.1 kg 82.5 kg    Examination:  General this is a 63 year old black female currently on pressure support ventilation and appears comfortable.  She is more briskly awake than exams in the past HEENT normocephalic atraumatic no jugular venous distention orally intubated mucous membranes slightly dry Pulmonary: Diminished throughout.  Tidal volume in the 350 to 400 mL range on pressure support 10 cm of water.  Attempt at spontaneous breathing trial yields SBI exceeding 105 Cardiac: Regular rate and rhythm without murmur rub or gallop Abdomen: Soft, dressing intact, bowel sounds remain quiet Extremities: Warm, dry, persistent anasarca with weeping extremities.  Upper  extremities specifically more swollen than lowers GU: Anuric Neuro: Opens eyes to verbal request.  Not able to move upper extremities.  She did wiggle her toes with the right lower extremity to request.  She is definitely more readily awake than she had been on prior exam    Resolved Hospital Problem list   Metabolic alkalosis Abdominal peritonitis with septic shock  Circulatory shock, mix of cardiogenic and sepsis off pressors as of 6/15 Cardiorespiratory arrest 6/13 Shock liver Assessment & Plan:   Status post small bowel resection, lysis of adhesions, status post reexploration on 6/14 with creation of side-to-side functional end-to-end small bowel anastomosis and closure of abdominal wall -He still has significant generalized anasarca complicated further by protein calorie malnutrition -Continues to have marked leukocytosis,No fever but is on CRRT which can mask this Plan Continue routine wound management and evaluation per surgery Bowel rest Continue TNA Stopping antibiotics today  Acute hypoxic respiratory failure in setting of peritonitis Portable chest x-ray personally reviewed: From 6/16: This demonstrated support tubes and lines in satisfactory position there was bibasilar atelectasis and effusion Again her primary barrier to extubation at this point remains profound deconditioning, ongoing encephalopathy, and volume overload. She is tolerating pressure support ventilation, however her SBI exceeds 105 when pressure support decreased to 5 cmH2O Plan Continue pressure support ventilation with mandatory at bedtime rest on full support  Continue to pull aggressive volume removal at -200 mL an hour  VAP bundle  Minimizing sedation  Repeat chest x-ray in a.m. 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  Tolerating net negative goals.  We are now down to just 2 L positive this seems to be helping She is essentially anuric currently Plan Continue CRRT per nephrology  Continue to trend  chemistries  Acute metabolic encephalopathy.  Suspect this is multifactorial secondary to delayed clearance from renal dysfunction of sedating medications also resolving sepsis.  She has improved ever so slightly, more briskly reactive but profoundly weak and deconditioned Plan RASS goal 0 We will decrease PRN fentanyl  Type 2 diabetes Glycemic control a little worse since starting TNA Plan Holding metformin Continue sliding scale We will add low-dose basal aspart every 4 hours  Thrombocytopenia from sepsis Platelets continue to normalize Plan Start subcutaneous heparin Repeat a.m. CBC watching for bleeding   Summary -multiorgan failure related to bowel perforation and septic shock, now improving gradually  Best practice:  Diet: N.p.o., TNA started 6/15 DVT prophylaxis: SCDs GI prophylaxis: Protonix Mobility: Bedrest Code Status: Full code Disposition: She remains critically ill due to her need for mechanical ventilation, titration of pressure support, and aggressive removal of volume on CRRT.  She has certainly improved however her SBI suggests she is not ready for extubation, I also worry about her current mental status, accessory muscle endurance, and nutritional status as well as ongoing volume overload.  I think the volume removal has helped.  Her weakness is not something that will rapidly improve.  We will give her another day of aggressive volume removal, continue nutritional support, continue pressure support ventilation.  Decreasing sedating medication further.  I am hopeful she will be ready for extubation in the next 20 to 48 hours  however even following extubation she will require aggressive pulmonary hygiene measures  Critical care time is 33 minutes Erick Colace ACNP-BC [place] Pager # [phone] OR # [phone] if no answer  12/01/2018

## 2018-12-01 NOTE — Progress Notes (Addendum)
3 Days Post-Op  Subjective: CC: More awake this morning. Opens eyes. Reportedly doing well weaning. No new events overnight.   Objective: Vital signs in last 24 hours: Temp:  [96.4 F (35.8 C)-98.1 F (36.7 C)] 96.5 F (35.8 C) (06/17 0400) Pulse Rate:  [85-110] 109 (06/17 0800) Resp:  [16-35] 35 (06/17 0800) BP: (121-196)/(50-98) 139/66 (06/17 0800) SpO2:  [97 %-100 %] 98 % (06/17 0800) FiO2 (%):  [30 %] 30 % (06/17 0736) Weight:  [82.5 kg] 82.5 kg (06/17 0305) Last BM Date: 11/24/18  Intake/Output from previous day: 06/16 0701 - 06/17 0700 In: 1049.2 [I.V.:842.1; IV Piggyback:200.1] Out: 5575 [Emesis/NG output:125] Intake/Output this shift: Total I/O In: 81 [I.V.:46.5; IV Piggyback:34.6] Out: 257 [Other:257]  Vent Mode: PSV;CPAP FiO2 (%):  [30 %] 30 % Set Rate:  [20 bmp] 20 bmp Vt Set:  [430 mL] 430 mL PEEP:  [5 cmH20] 5 cmH20 Pressure Support:  [10 cmH20] 10 cmH20 Plateau Pressure:  [20 cmH20-22 cmH20] 20 cmH20  PE: Gen: critically ill on vent, opens eyes. Central line in [place], HD cath in right neck.  Heart: regular Lungs: Diminished at the bases b/l, on vent.    Abd: soft, doesn't grimace much to palpation to abdomen, midline wound with some serous drainage on her ABD (last changed yesterday morning), not much granulation tissue, no dehiscence, hypoactive BS, NGT with bilious output 125cc/24 hours Ext: 1-2+ pitting edema b/l. good pulses in all extremities     Lab Results:  Recent Labs    11/30/18 0321 12/01/18 0222  WBC 23.4* 23.5*  HGB 9.0* 9.9*  HCT 26.5* 29.6*  PLT 49* 67*   BMET Recent Labs    11/30/18 1733 12/01/18 0222  NA 138 138   138  K 5.9* 4.2   4.2  CL 104 102   102  CO2 21* 25   25  GLUCOSE 135* 201*   204*  BUN 28* 29*   29*  CREATININE 1.39* 1.19*   1.27*  CALCIUM 6.7* 7.3*   7.2*   PT/INR No results for input(s): LABPROT, INR in the last 72 hours. CMP     Component Value Date/Time   NA 138 12/01/2018 0222   NA 138  12/01/2018 0222   NA 138 06/01/2017 0859   K 4.2 12/01/2018 0222   K 4.2 12/01/2018 0222   K 4.2 06/01/2017 0859   CL 102 12/01/2018 0222   CL 102 12/01/2018 0222   CO2 25 12/01/2018 0222   CO2 25 12/01/2018 0222   CO2 24 06/01/2017 0859   GLUCOSE 201 (H) 12/01/2018 0222   GLUCOSE 204 (H) 12/01/2018 0222   GLUCOSE 118 06/01/2017 0859   BUN 29 (H) 12/01/2018 0222   BUN 29 (H) 12/01/2018 0222   BUN 15.2 06/01/2017 0859   CREATININE 1.19 (H) 12/01/2018 0222   CREATININE 1.27 (H) 12/01/2018 0222   CREATININE 0.79 06/02/2018 0856   CREATININE 0.7 06/01/2017 0859   CALCIUM 7.3 (L) 12/01/2018 0222   CALCIUM 7.2 (L) 12/01/2018 0222   CALCIUM 9.0 06/01/2017 0859   PROT 5.6 (L) 12/01/2018 0222   PROT 7.0 06/01/2017 0859   ALBUMIN 1.7 (L) 12/01/2018 0222   ALBUMIN 1.7 (L) 12/01/2018 0222   ALBUMIN 3.7 06/01/2017 0859   AST 60 (H) 12/01/2018 0222   AST 13 (L) 06/02/2018 0856   AST 14 06/01/2017 0859   ALT 209 (H) 12/01/2018 0222   ALT 16 06/02/2018 0856   ALT 12 06/01/2017 0859   ALKPHOS  482 (H) 12/01/2018 0222   ALKPHOS 97 06/01/2017 0859   BILITOT 2.0 (H) 12/01/2018 0222   BILITOT 0.3 06/02/2018 0856   BILITOT 0.22 06/01/2017 0859   GFRNONAA 48 (L) 12/01/2018 0222   GFRNONAA 45 (L) 12/01/2018 0222   GFRNONAA >60 06/02/2018 0856   GFRAA 56 (L) 12/01/2018 0222   GFRAA 52 (L) 12/01/2018 0222   GFRAA >60 06/02/2018 0856   Lipase     Component Value Date/Time   LIPASE 24 12/09/2018 0355       Studies/Results: Dg Chest Port 1 View  Result Date: 11/30/2018 CLINICAL DATA:  Acute respiratory failure.  Pleural effusions. EXAM: PORTABLE CHEST 1 VIEW COMPARISON:  11/16/2018 and 11/29/2018 FINDINGS: Endotracheal tube is 4.4 cm above the carina. Central venous catheter and Port-A-Cath tips are above the cavoatrial junction in the superior vena cava. NG tube tip is below the diaphragm. There are persistent small bilateral pleural effusions. Heart size is normal. Pulmonary vascularity  is improved. IMPRESSION: Persistent small bilateral pleural effusions. Improved pulmonary vascular congestion. Electronically Signed   By: Lorriane Shire M.D.   On: 11/30/2018 08:19    Anti-infectives: Anti-infectives (From admission, onward)   Start     Dose/Rate Route Frequency Ordered Stop   12/12/2018 1800  fluconazole (DIFLUCAN) IVPB 200 mg  Status:  Discontinued     200 mg 100 mL/hr over 60 Minutes Intravenous Every 24 hours 12/07/2018 0748 12/12/2018 0847   11/30/2018 1800  fluconazole (DIFLUCAN) IVPB 400 mg  Status:  Discontinued     400 mg 100 mL/hr over 120 Minutes Intravenous Every 24 hours 12/05/2018 0847 12/06/2018 0856   12/08/2018 1400  piperacillin-tazobactam (ZOSYN) IVPB 2.25 g     2.25 g 100 mL/hr over 30 Minutes Intravenous Every 6 hours 11/20/2018 0748     12/09/2018 0600  cefoTEtan (CEFOTAN) 2 g in sodium chloride 0.9 % 100 mL IVPB     2 g 200 mL/hr over 30 Minutes Intravenous On call to O.R. 11/22/18 1300 12/06/2018 0624   11/22/18 1800  fluconazole (DIFLUCAN) IVPB 400 mg  Status:  Discontinued     400 mg 100 mL/hr over 120 Minutes Intravenous Every 24 hours 12/07/2018 1528 12/14/2018 0748   11/22/18 1800  vancomycin (VANCOCIN) 1,500 mg in sodium chloride 0.9 % 500 mL IVPB  Status:  Discontinued     1,500 mg 250 mL/hr over 120 Minutes Intravenous Every 24 hours 11/22/18 0358 11/22/18 0750   11/22/18 0115  vancomycin (VANCOCIN) 1,500 mg in sodium chloride 0.9 % 500 mL IVPB     1,500 mg 250 mL/hr over 120 Minutes Intravenous  Once 11/22/18 0101 11/22/18 0319   12/07/2018 1630  fluconazole (DIFLUCAN) IVPB 800 mg     800 mg 100 mL/hr over 240 Minutes Intravenous  Once 11/30/2018 1527 12/12/2018 2013   12/03/2018 1600  piperacillin-tazobactam (ZOSYN) IVPB 3.375 g  Status:  Discontinued     3.375 g 12.5 mL/hr over 240 Minutes Intravenous Every 8 hours 11/26/2018 1522 11/24/2018 0748   12/09/2018 0545  piperacillin-tazobactam (ZOSYN) IVPB 3.375 g     3.375 g 100 mL/hr over 30 Minutes Intravenous  Once  12/05/2018 0530 12/06/2018 0626   12/06/2018 0530  piperacillin-tazobactam (ZOSYN) IVPB 4.5 g  Status:  Discontinued     4.5 g 200 mL/hr over 30 Minutes Intravenous  Once 11/17/2018 0517 12/01/2018 0529       Assessment/Plan HTN HLD DM Hypothyroidism 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 Ninfa Linden - POD #10 2. S/pReexploration of recent laparotomyWITH [facility] OUT (N[facility]9 Dr. Rosendo Gros - POD #8 3. S/pExploratory laparotomy with washout of abdomen and placement of vacuum VAC dressing6/11 Dr. Brantley Stage - POD #6 4. Exploratory laparotomy with anastomosis of SB and closure of abdominal wall 6/14 Dr. Brantley Stage - POD #3 - Cont NGT and await ileus resolution - Cont 1/2 dose TNA today. LFTs continue to downtrend. trend CMET over the next couple of days to see when we can increase to full dose TNA.  - Cont BID NSWD dressing changes to midline wound - WBC still up today but no fevers.  Suspect this may still be reactive from surgery.  Will continue to follow for now.  No change in ABX therapy at this time from an abdominal standpoint  ID -diflucan 6/7-6/11, vancomycin 6/8-6/8. zosyn 6/7>> (day 10) WBC 23,500 FEN -IVF, NPO/NGT. 1/2 TNA  VTE -SCDs, lovenox on hold for thrombocytopenia Foley -in place Contact - brother GeorgeMattier([phone] or [phone])   LOS: 10 days    Jillyn Ledger , [facility] 12/01/2018, 8:27 AM Pager: [phone]

## 2018-12-01 NOTE — Progress Notes (Signed)
[facility] Progress Note  Subjective: still intubated, working on weaning trials and getting MS better.  Last CVP 3.    Vitals:   12/01/18 1124 12/01/18 1200 12/01/18 1300 12/01/18 1400  BP: 126/61 127/69 (!) 119/54 (!) 112/57  Pulse: (!) 112 (!) 114 (!) 114 (!) 116  Resp: (!) 36 (!) 35 (!) 37 (!) 38  Temp:  98.2 F (36.8 C)    TempSrc:  Oral    SpO2: 98% 98% 98% 99%  Weight:      Height: _0  (1.626 m)       Inpatient medications: . sodium chloride   Intravenous Once  . sodium chloride   Intravenous Once  . chlorhexidine gluconate (MEDLINE KIT)  15 mL Mouth Rinse BID  . Chlorhexidine Gluconate Cloth  6 each Topical Daily  . dorzolamide-timolol  1 drop Both Eyes BID  . heparin injection (subcutaneous)  5,000 Units Subcutaneous Q8H  . insulin aspart  3-9 Units Subcutaneous Q4H  . mouth rinse  15 mL Mouth Rinse 10 times per day  . pantoprazole (PROTONIX) IV  40 mg Intravenous Q24H   .  prismasol BGK 4/2.5 400 mL/hr at 12/01/18 1425  .  prismasol BGK 4/2.5 200 mL/hr at 11/30/18 1322  . sodium chloride 10 mL/hr at 12/01/18 1400  . norepinephrine (LEVOPHED) Adult infusion Stopped (11/29/18 0800)  . prismasol bgk dialysis solution with potassium 2,000 mL/hr at 12/01/18 1429  . TPN ADULT (ION) 40 mL/hr at 12/01/18 1400  . TPN ADULT (ION)     sodium chloride, acetaminophen, albuterol, alteplase, fentaNYL (SUBLIMAZE) injection, heparin, hydrALAZINE, metoprolol tartrate, sodium chloride, sodium chloride flush    Exam: Gen on vent, unresponsive NECK: no JVD PULM: coarse mech breath sounds bilaterally CV: RRR no MRG ABD: soft, abd wall edema, dressings c/d/i GU foley cath minimal urine--> but more than yesterday Ext anasarca improving Neuro intuabted, unresponsive   Assessment: 1. Acute renal failure - in setting of septic shock due to perf bowel, suspected ATN. SP CRRT 6/9 - 6/12, held after PEA arrest.  No UOP. Resumed 6/14 after abdominal closure, doing  well.   All 4K, no heparin.  Finally getting into better fluid balance after aggressive UF.  CVP lower, tachy, BP lower today- decrease rate of UF to 50-100 mL/ hr for next several hours.  Corrected calcium is 8.5.   2. SP PEA arrest: on 6/13 in ICU 3. Acute hypoxic respiratory failure: weaning as able, per PCCM 4. Septic shock: on Zosyn, s/p fluconazole 5. Perforated small bowel due to closed-loop SBO / septic shock- per CCM/ gen surg, s/p closure 6/14. NGT in place 6. DM on insulin 7. Severe hypoalbuminemia: per primary-- on TPN 8. Dispo: remains critically ill in ICU   [place] pgr [phone] 12/01/2018, 2:30 PM  Iron/TIBC/Ferritin/ %Sat No results found for: IRON, TIBC, FERRITIN, IRONPCTSAT Recent Labs  Lab 11/19/2018 0327  12/01/18 0222  NA 139   < > 138  138  K 4.3   < > 4.2  4.2  CL 105   < > 102  102  CO2 17*   < > 25  25  GLUCOSE 171*   < > 201*  204*  BUN 27*   < > 29*  29*  CREATININE 2.09*   < > 1.19*  1.27*  CALCIUM 6.2*   < > 7.3*  7.2*  PHOS 3.4   < > 2.4*  2.4*  ALBUMIN 1.5*   < >  1.7*  1.7*  INR 1.6*  --   --    < > = values in this interval not displayed.   Recent Labs  Lab 12/01/18 0222  AST 60*  ALT 209*  ALKPHOS 482*  BILITOT 2.0*  PROT 5.6*   Recent Labs  Lab 12/01/18 0222  WBC 23.5*  HGB 9.9*  HCT 29.6*  PLT 67*

## 2018-12-01 NOTE — Progress Notes (Signed)
RT called to bedside due to pt's RR increased to 38 and HR 118. Pt placed back on full support. Breath sounds were clear/diminished. RT will continue to monitor.

## 2018-12-02 ENCOUNTER — Inpatient Hospital Stay (HOSPITAL_COMMUNITY): Payer: BLUE CROSS/BLUE SHIELD

## 2018-12-02 LAB — PREPARE PLATELET PHERESIS
Unit division: 0
Unit division: 0
Unit division: 0

## 2018-12-02 LAB — BPAM PLATELET PHERESIS
Blood Product Expiration Date: 202006152359
Blood Product Expiration Date: 202006162359
Blood Product Expiration Date: 202006172359
ISSUE DATE / TIME: 202006130913
ISSUE DATE / TIME: 202006151447
Unit Type and Rh: 6200
Unit Type and Rh: 6200
Unit Type and Rh: 7300

## 2018-12-02 LAB — RENAL FUNCTION PANEL
Albumin: 1.5 g/dL — ABNORMAL LOW (ref 3.5–5.0)
Anion gap: 10 (ref 5–15)
BUN: 32 mg/dL — ABNORMAL HIGH (ref 8–23)
CO2: 24 mmol/L (ref 22–32)
Calcium: 7.3 mg/dL — ABNORMAL LOW (ref 8.9–10.3)
Chloride: 101 mmol/L (ref 98–111)
Creatinine, Ser: 1.12 mg/dL — ABNORMAL HIGH (ref 0.44–1.00)
GFR calc Af Amer: >60 mL/min (ref >60)
GFR calc non Af Amer: 52 mL/min — ABNORMAL LOW (ref >60)
Glucose, Bld: 220 mg/dL — ABNORMAL HIGH (ref 70–99)
Phosphorus: 3.3 mg/dL (ref 2.5–4.6)
Potassium: 4.2 mmol/L (ref 3.5–5.1)
Sodium: 135 mmol/L (ref 135–145)

## 2018-12-02 LAB — COMPREHENSIVE METABOLIC PANEL
ALT: 127 U/L — ABNORMAL HIGH (ref 0–44)
AST: 41 U/L (ref 15–41)
Albumin: 1.5 g/dL — ABNORMAL LOW (ref 3.5–5.0)
Alkaline Phosphatase: 454 U/L — ABNORMAL HIGH (ref 38–126)
Anion gap: 13 (ref 5–15)
BUN: 34 mg/dL — ABNORMAL HIGH (ref 8–23)
CO2: 22 mmol/L (ref 22–32)
Calcium: 7.4 mg/dL — ABNORMAL LOW (ref 8.9–10.3)
Chloride: 102 mmol/L (ref 98–111)
Creatinine, Ser: 1.29 mg/dL — ABNORMAL HIGH (ref 0.44–1.00)
GFR calc Af Amer: 51 mL/min — ABNORMAL LOW (ref >60)
GFR calc non Af Amer: 44 mL/min — ABNORMAL LOW (ref >60)
Glucose, Bld: 192 mg/dL — ABNORMAL HIGH (ref 70–99)
Potassium: 3.9 mmol/L (ref 3.5–5.1)
Sodium: 137 mmol/L (ref 135–145)
Total Bilirubin: 1.5 mg/dL — ABNORMAL HIGH (ref 0.3–1.2)
Total Protein: 5.6 g/dL — ABNORMAL LOW (ref 6.5–8.1)

## 2018-12-02 LAB — GLUCOSE, CAPILLARY
Glucose-Capillary: 165 mg/dL — ABNORMAL HIGH (ref 70–99)
Glucose-Capillary: 151 mg/dL — ABNORMAL HIGH (ref 70–99)
Glucose-Capillary: 95 mg/dL (ref 70–99)
Glucose-Capillary: 197 mg/dL — ABNORMAL HIGH (ref 70–99)
Glucose-Capillary: 204 mg/dL — ABNORMAL HIGH (ref 70–99)

## 2018-12-02 LAB — PHOSPHORUS: Phosphorus: 1.8 mg/dL — ABNORMAL LOW (ref 2.5–4.6)

## 2018-12-02 LAB — MAGNESIUM: Magnesium: 2.4 mg/dL (ref 1.7–2.4)

## 2018-12-02 MED ORDER — STERILE WATER FOR INJECTION IV SOLN
INTRAVENOUS | Status: AC
  Administered 2018-12-02: 17:00:00 via INTRAVENOUS
  Filled 2018-12-02: qty 1200

## 2018-12-02 MED ORDER — PRISMASOL BGK 4/2.5 32-4-2.5 MEQ/L IV SOLN
INTRAVENOUS | Status: DC
  Administered 2018-12-02 – 2018-12-05 (×22): via INTRAVENOUS_CENTRAL
  Filled 2018-12-02 (×40): qty 5000

## 2018-12-02 MED ORDER — POTASSIUM PHOSPHATES 15 MMOLE/5ML IV SOLN
20.0000 mmol | Freq: Once | INTRAVENOUS | Status: AC
  Administered 2018-12-02: 10:00:00 20 mmol via INTRAVENOUS
  Filled 2018-12-02: qty 6.67

## 2018-12-02 NOTE — Progress Notes (Addendum)
Spoke with patient's brother on the phone and provided an update.

## 2018-12-02 NOTE — Progress Notes (Signed)
NAME:  Carmen Stone, MRN:  [id number], DOB:  03-01-1956, LOS: 78 ADMISSION DATE:  12/08/2018, CONSULTATION DATE:  11/21/2018 REFERRING MD:  Dr. Ninfa Linden, Surgery, CHIEF COMPLAINT:  Abdominal pain   Brief History   63 yo female presented to ER with nausea, vomiting, abdominal pain.  CT abd/pelvis showed focal perforated loop of SB with free air with multiple complex ventral hernias.  Had emergent laparotomy with SB resection, lysis of adhesions and wound vac.  Remained on vent and pressors post op.  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  Changing volume goal on CRRT to -100 cc an hour.  Starting to wean.  Fentanyl infusion stopped changed to PRN.  7 hours weaning 6/16 remains encephalopathic.  Requiring intermittent fentanyl.  Tolerating clears to 200 cc negative fluid balance hourly.  She is hypertensive, adding labetalol.  IV TNA started 6/17: A little more awake.  Tolerating pressure support but SBI exceeds 105 on spontaneous breathing trial.  Continuing aggressive volume removal with CRRT, volume status now down to just around 2 L positive.  Decreasing PRN sedation further 6/18: Neuro status continues to improve.  Hemodynamics stable however did require brief vasoactive drips during nighttime.  She is now at an net negative fluid volume status.  Likely nearing euvolemic state.  Tolerating spontaneous breathing trial Consults:  Renal 6/09 AKI, acidosis Cardiology 6/09 Takotsubo CM    Procedures:  ETT 6/07 >> Rt IJ HD 6/09 >> Rt [place] CVL 6/09 >> Lt radial aline 6/09 >>  Significant Diagnostic Tests:  CT abd/pelvis 6/07 >> focal perforated loop of SB with free air with multiple complex ventral hernias Echo 6/08 >> EF 20%, Takotsubo CM  Micro Data:  COVID 6/07 >> negative  Antimicrobials:  Zosyn 6/07 >> 6/17 Diflucan 6/07 -6/10  Interim history/subjective:  Continues to improve   Objective   Blood pressure 127/60, pulse 99, temperature (Abnormal) 97.1 F (36.2 C), temperature source Axillary, resp. rate (Abnormal) 21, height 5\' 4"  (1.626 m), weight 76 kg, SpO2 100 %. CVP:  [3 mmHg-4 mmHg] 4 mmHg  Vent Mode: PSV;CPAP FiO2 (%):  [30 %] 30 % Set Rate:  [20 bmp] 20 bmp Vt Set:  [430 mL] 430 mL PEEP:  [5 cmH20] 5 cmH20 Pressure Support:  [10 cmH20] 10 cmH20 Plateau Pressure:  [20 cmH20-22 cmH20] 22 cmH20   Intake/Output Summary (Last 24 hours) at 12/02/2018 0830 Last data filed at 12/02/2018 0800 Gross per 24 hour  Intake 1831.03 ml  Output 4097 ml  Net -2265.97 ml   Filed Weights   11/30/18 0756 12/01/18 0305 12/02/18 0407  Weight: 85.1 kg 82.5 kg 76 kg    Examination: General this is a 63 year old black female she is currently cycling on pressure support ventilation and appears to be in no acute distress HEENT normocephalic atraumatic no jugular venous distention orally intubated Pulmonary: Diminished in the bases no accessory use on spontaneous breathing trial.  She is: Tidal volume ranges of 350 to 450 mL on pressure support of 5 cmH2O Cardiac: Mildly tachycardic no murmur  rub or gallop Abdomen: Mid abdominal dressing intact, hypoactive bowel sounds.  NG tube with bilious output GU: Essentially an uric Extremities: Diffuse anasarca, upper extremity still weeping however her swelling and weeping has dramatically improved.  Of also seen improvement in range of motion particularly the upper extremities as we have removed volume Neuro: Briskly awake,  follows commands, able to move against gravity in the upper extremities now, will wiggle toes bilaterally.     Resolved Hospital Problem list   Metabolic alkalosis Abdominal peritonitis with septic shock Circulatory shock, mix of cardiogenic and sepsis off pressors as of 6/15 Cardiorespiratory arrest 6/13 Shock liver Assessment & Plan:   Status post small bowel resection, lysis of adhesions, status post reexploration on 6/14 with creation of side-to-side functional end-to-end small bowel anastomosis and closure of abdominal wall -He still has significant generalized anasarca complicated further by protein calorie malnutrition -Continues to have marked leukocytosis,No fever but is on CRRT which can mask this Plan Continue routine wound care  Bowel rest  TNA  Repeat CBC in a.m. monitoring closely for infection, antibiotics were discontinued on 6/17   Acute hypoxic respiratory failure in setting of peritonitis Portable chest x-ray personally reviewed: This demonstrates basilar volume loss but improved aeration bilaterally probable element of residual effusion versus atelectasis -She is tolerating spontaneous breathing trial, frequency tidal volume ratio is acceptable, work of breathing appears acceptable we will need to see how she looks with time Plan Continue spontaneous breathing trial for now, will reevaluate and assess for extubation versus ongoing pressure support for today  She is nearing extubation  Continue volume removal as tolerated  Minimization of sedation  VAP bundle   Acute systolic congestive heart failure with ejection fraction of 20%, 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 Plan Holding lisinopril and Pravachol  As needed hydralazine for systolic blood pressure greater than 180  Volume removal via CRRT  Continue telemetry monitoring  Will need repeat echocardiogram in about 3 to 4 weeks   Acute kidney injury from tubular necrosis, baseline creatinine of 0.73 from  12/01/2018 She is actually now at a negative fluid balance since admit She is essentially anuric currently Plan Continue CRRT Agree that we can decrease net negative goal, pulling 50 to 100 mL an hour we may be nearing euvolemic state Continue to trend blood chemistry  Hypophosphatemia Plan Replace recheck  Acute metabolic encephalopathy.  Suspect this is multifactorial secondary to delayed clearance from renal dysfunction of sedating medications also resolving sepsis.  This is daily improving Plan RA SS goal 0 PRN fentanyl, 12.5 mcg as needed for pain  Type 2 diabetes Looks like we could still increase basal TNA insulin Plan Continuing basal insulin with TNA as well as sliding scale  Thrombocytopenia from sepsis Platelets continue to normalize, subcutaneous heparin resumed on 6/17 Plan Repeat CBC in the a.m. on 6/19  Difficult intravenous access She has only collateral blood vessels in the left neck, I suspect secondary to long term poor on the left.  Currently have a right internal jugular vein HD catheter as well as right subclavian catheter.  In addition to port.  I do not think femoral access is a wise choice for her both for infection prevention and also given abdominal swelling and wound I doubt we would be able to have reliable blood flow from the HD catheter using that site Plan We will see if we can access the left port.  TNA Evaluate to see if we can get by with only the distal port  on HD cath and hopefully remove subclavian Summary -multiorgan failure related to bowel perforation and septic shock, now improving gradually  Best practice:  Diet: N.p.o., TNA started 6/15 DVT prophylaxis: SCDs, subcu heparin GI prophylaxis: Protonix Mobility: Bedrest Code Status: Full code Disposition: She is continuing to improve daily making small but clear improvement every day.  I think we are nearing euvolemic state given she is now in negative volume status.  She seems to be  tolerating spontaneous breathing trial however her deconditioning and weakness remain a barrier.  For today we will continue supportive care this will include nutritional support with TNA, range of motion, bed mobility assist, and weaning.  She is currently on spontaneous breathing trial will reassess her after about 35 to 45 minutes and see readiness for extubation.  I think we are nearing that point within the next 24 hours.  Critical care time  Erick Colace ACNP-BC [place] Pager # [phone] OR # [phone] if no answer  12/02/2018

## 2018-12-02 NOTE — Progress Notes (Addendum)
PHARMACY - ADULT TOTAL PARENTERAL NUTRITION CONSULT NOTE   Pharmacy Consult for TPN Indication: prolonged ileus  Patient Measurements: Height: 5\' 4"  (162.6 cm) Weight: 167 lb 8.8 oz (76 kg) IBW/kg (Calculated) : 54.7 TPN AdjBW (KG): 59.7 Body mass index is 28.76 kg/m. Usual Weight: 90kg  Insulin Requirements: 45 units in past 24 hours  Current Nutrition: NPO  IVF: none  Central access: 11/24/2018 TPN start date: 11/30/18  ASSESSMENT                                                                                                          HPI: She presented 6/7 with perforated small bowel from closed-loop obstruction and underwent emergent exploratory laparotomy.  She returned to OR on 6/9 and again on 6/14 for closure of abdominal wall. She coded on 12/25/22 and developed multiorgan failure.  She remains intubated & on CRRT.  Pressors have been weaned off and she is completing antibiotic course for sepsis/PNA.   She has been NPO since admission on 6/7.  Pharmacy consulted to start TPN.  Acute liver injury is improving but will initiate TPN at low rate & advance slowly as requested by surgery.    Significant events:  6/18: OK to increase to goal rate per surgery  Today:   Glucose - Hx DM on 70/30 Novolog 20 units BID PTA.  CBGs now above goal but improving with addition of insulin to TPN + ICU resistant scale (range 151-238).  Electrolytes - WNL today except Phos continues to trend down (1.8 today)   Renal - AKI on CRRT  LFTs - Acute liver injury- AST/ALT elevated but trending down (41/127).  TBili 1.5  TGs - 85 (6/9)  Prealbumin - <5 (6/17)  NUTRITIONAL GOALS                                                                                             RD recs:  Kcal:  1530 kcal, Protein:  172-187 grams (2.3-2.5 grams/kg), Fluid:  >/= 1.8 L/day  Custom TPN at goal rate of 75 ml/hr (NO LIPIDS x7 days) to provide: 180 g/day protein, 1515 Kcal/day.  PLAN  At 1800 today:  Start high protein, hypocaloric TPN per recommendations for obese, critically ill patient.  Increase custom TPN to 75 ml/hr.  Electrolytes in TPN as follows:  Na 33mEq/L, Phos 20Mmol/L, Ca 56mEq/L, NO K or Mag.  Max acetate.  Hold 20% lipid emulsion for first 7 days for ICU patients per ASPEN guidelines (Start date 6/23)  TPN to contain standard multivitamins.  Add trace elements on MWF only due to national backorder.  MIVF per MD- currently none  Continue resistant ICU glycemic control orders q4h.   Increase to  25 units/L of insulin in TPN  TPN lab panels tomorrow then on Mondays & Thursdays.  Check BMET, Mag, Phos in am.  F/u daily.  Carmen Stone, Carmen Stone 12/02/2018,7:30 AM

## 2018-12-02 NOTE — Progress Notes (Signed)
4 Days Post-Op  Subjective: CC:  Notes reviewed from overnight. Patient now opening eyes to command. Had episode of hypotension yesterday. Levo off this morning. Still on CRRT. Suppose to try weaning today. No BM. Not on sedatives.   Objective: Vital signs in last 24 hours: Temp:  [97.6 F (36.4 C)-98.4 F (36.9 C)] 97.6 F (36.4 C) (06/18 0400) Pulse Rate:  [95-122] 101 (06/18 0730) Resp:  [20-38] 26 (06/18 0730) BP: (59-165)/(37-86) 137/67 (06/18 0730) SpO2:  [97 %-100 %] 100 % (06/18 0730) FiO2 (%):  [30 %] 30 % (06/18 0400) Weight:  [76 kg] 76 kg (06/18 0407) Last BM Date: 11/24/18  Intake/Output from previous day: 06/17 0701 - 06/18 0700 In: 1861.5 [I.V.:1311.5; IV Piggyback:550] Out: 1025 [Urine:20; Emesis/NG output:240] Intake/Output this shift: No intake/output data recorded.  PE: Gen: critically ill on vent, opens eyes. Central line in [place], HD cath in right neck.  Heart: regular Lungs: Rhonchi noted. Diminished at the bases b/l, on vent.  Abd: soft, doesn't grimace to palpation to abdomen, midline wound with some serous drainage on her ABD, some granulation tissue at wound edges, mainly fatty tissue. No dehiscence, hypoactive BS, NGT with bilious output 240cc/24 hours Ext: SCD in place. Good pulses in all extremities  Lab Results:  Recent Labs    11/30/18 0321 12/01/18 0222  WBC 23.4* 23.5*  HGB 9.0* 9.9*  HCT 26.5* 29.6*  PLT 49* 67*   BMET Recent Labs    12/01/18 2049 12/02/18 0307  NA 136 137  K 3.9 3.9  CL 102 102  CO2 24 22  GLUCOSE 238* 192*  BUN 33* 34*  CREATININE 1.27* 1.29*  CALCIUM 7.1* 7.4*   PT/INR No results for input(s): LABPROT, INR in the last 72 hours. CMP     Component Value Date/Time   NA 137 12/02/2018 0307   NA 138 06/01/2017 0859   K 3.9 12/02/2018 0307   K 4.2 06/01/2017 0859   CL 102 12/02/2018 0307   CO2 22 12/02/2018 0307   CO2 24 06/01/2017 0859   GLUCOSE 192 (H) 12/02/2018 0307   GLUCOSE 118 06/01/2017  0859   BUN 34 (H) 12/02/2018 0307   BUN 15.2 06/01/2017 0859   CREATININE 1.29 (H) 12/02/2018 0307   CREATININE 0.79 06/02/2018 0856   CREATININE 0.7 06/01/2017 0859   CALCIUM 7.4 (L) 12/02/2018 0307   CALCIUM 9.0 06/01/2017 0859   PROT 5.6 (L) 12/02/2018 0307   PROT 7.0 06/01/2017 0859   ALBUMIN 1.5 (L) 12/02/2018 0307   ALBUMIN 3.7 06/01/2017 0859   AST 41 12/02/2018 0307   AST 13 (L) 06/02/2018 0856   AST 14 06/01/2017 0859   ALT 127 (H) 12/02/2018 0307   ALT 16 06/02/2018 0856   ALT 12 06/01/2017 0859   ALKPHOS 454 (H) 12/02/2018 0307   ALKPHOS 97 06/01/2017 0859   BILITOT 1.5 (H) 12/02/2018 0307   BILITOT 0.3 06/02/2018 0856   BILITOT 0.22 06/01/2017 0859   GFRNONAA 44 (L) 12/02/2018 0307   GFRNONAA >60 06/02/2018 0856   GFRAA 51 (L) 12/02/2018 0307   GFRAA >60 06/02/2018 0856   Lipase     Component Value Date/Time   LIPASE 24 11/16/2018 0355       Studies/Results: No results found.  Anti-infectives: Anti-infectives (From admission, onward)   Start     Dose/Rate Route Frequency Ordered Stop   12/09/2018 1800  fluconazole (DIFLUCAN) IVPB 200 mg  Status:  Discontinued     200 mg 100  mL/hr over 60 Minutes Intravenous Every 24 hours 12/14/2018 0748 11/26/2018 0847   12/04/2018 1800  fluconazole (DIFLUCAN) IVPB 400 mg  Status:  Discontinued     400 mg 100 mL/hr over 120 Minutes Intravenous Every 24 hours 12/02/2018 0847 12/12/2018 0856   12/03/2018 1400  piperacillin-tazobactam (ZOSYN) IVPB 2.25 g  Status:  Discontinued     2.25 g 100 mL/hr over 30 Minutes Intravenous Every 6 hours 11/22/2018 0748 12/01/18 1104   11/20/2018 0600  cefoTEtan (CEFOTAN) 2 g in sodium chloride 0.9 % 100 mL IVPB     2 g 200 mL/hr over 30 Minutes Intravenous On call to O.R. 11/22/18 1300 11/26/2018 0624   11/22/18 1800  fluconazole (DIFLUCAN) IVPB 400 mg  Status:  Discontinued     400 mg 100 mL/hr over 120 Minutes Intravenous Every 24 hours 11/17/2018 1528 12/07/2018 0748   11/22/18 1800  vancomycin  (VANCOCIN) 1,500 mg in sodium chloride 0.9 % 500 mL IVPB  Status:  Discontinued     1,500 mg 250 mL/hr over 120 Minutes Intravenous Every 24 hours 11/22/18 0358 11/22/18 0750   11/22/18 0115  vancomycin (VANCOCIN) 1,500 mg in sodium chloride 0.9 % 500 mL IVPB     1,500 mg 250 mL/hr over 120 Minutes Intravenous  Once 11/22/18 0101 11/22/18 0319   11/26/2018 1630  fluconazole (DIFLUCAN) IVPB 800 mg     800 mg 100 mL/hr over 240 Minutes Intravenous  Once 11/18/2018 1527 12/08/2018 2013   12/09/2018 1600  piperacillin-tazobactam (ZOSYN) IVPB 3.375 g  Status:  Discontinued     3.375 g 12.5 mL/hr over 240 Minutes Intravenous Every 8 hours 11/19/2018 1522 11/29/2018 0748   11/20/2018 0545  piperacillin-tazobactam (ZOSYN) IVPB 3.375 g     3.375 g 100 mL/hr over 30 Minutes Intravenous  Once 11/26/2018 0530 12/09/2018 0626   12/03/2018 0530  piperacillin-tazobactam (ZOSYN) IVPB 4.5 g  Status:  Discontinued     4.5 g 200 mL/hr over 30 Minutes Intravenous  Once 12/11/2018 0517 11/26/2018 0529       Assessment/Plan HTN HLD DM Hypothyroidism 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 Ninfa Linden - POD #11 2. S/pReexploration of recent laparotomyWITH [facility] OUT (N/A)6/9 Dr. Rosendo Gros - POD #9 3. S/pExploratory laparotomy with washout of abdomen and placement of vacuum VAC dressing6/11 Dr. Brantley Stage - POD #7 4. Exploratory laparotomy with anastomosis of SB and closure of abdominal wall 6/14 Dr. Brantley Stage - POD #4 - Cont NGT while on vent - LFTs still downtrending. Will increase to full TNA today. Continue to trend CMET -  Cont BID NSWD dressing changes to midline wound - No fevers. WBC 23,500 on 6/17.Trend. Suspect this may have been reactive from surgery. Will continue to follow for now. No change in ABX therapy at this time from an abdominal standpoint  ID -diflucan 6/7-6/11, vancomycin 6/8-6/8. zosyn 6/7>> (day 11)  FEN -IVF, NPO/NGT. TNA  VTE -SCDs, heparin Foley -in place Contact - brother GeorgeMattier([phone] or [phone])   LOS: 11 days    Jillyn Ledger , [facility] Surgery 12/02/2018, 7:47 AM Pager: [phone]

## 2018-12-02 NOTE — Progress Notes (Signed)
[facility] Progress Note    Assessment/ Plan:   1. Acute renal failure - in setting of septic shock due to perf bowel, suspected ATN. SP CRRT 6/9 - 6/12, held after PEA arrest.  No UOP. Resumed 6/14 after abdominal closure, doing well.   No heparin.  Finally getting into better fluid balance after aggressive UF.  CVP 3-4 for the last several days.  UF goal net neg 150 mL/ hr and if tolerates for a couple of hours could go to 200 mL/ hr.  K 3.9, had been switched to 3K dialysate bath, now will do 4K.     2. SP PEA arrest: on 6/13 in ICU 3. Acute hypoxic respiratory failure: weaning as able, per PCCM 4. Septic shock: on Zosyn, s/p fluconazole 5. Perforated small bowel due to closed-loop SBO / septic shock- per CCM/ gen surg, s/p closure 6/14. NGT in place 6. DM on insulin 7. Severe hypoalbuminemia: per primary-- on TPN 8. Dispo: remains critically ill in ICU  Subjective:    Intubated, transiently on pressors overnight and was kept even for a couple of hours.     Objective:   BP (!) 145/77   Pulse 92   Temp (!) 97.1 F (36.2 C) (Axillary)   Resp 18   Ht '5\' 4"'  (1.626 m)   Wt 76 kg   SpO2 100%   BMI 28.76 kg/m   Intake/Output Summary (Last 24 hours) at 12/02/2018 1203 Last data filed at 12/02/2018 1200 Gross per 24 hour  Intake 1939.07 ml  Output 3791 ml  Net -1851.93 ml   Weight change: -9.1 kg  Physical Exam: Genon vent, responds to noxious stimuli  NECK: no JVD  PULM: coarse mech breath sounds bilaterally CV: RRR no MRG ABD: soft, abd wall edema, dressings c/d/i, diffusely tender GUfoley cath minimal urine Extanasarca improving Neuro: responsive to noxious stimuli only   Imaging: Dg Chest Port 1 View  Result Date: 12/02/2018 CLINICAL DATA:  Endotracheal tube in place. EXAM: PORTABLE CHEST 1 VIEW COMPARISON:  One-view chest x-ray 11/30/2018 FINDINGS: Endotracheal tube is stable, 4 cm above the carina. Right IJ sheath is stable. A right subclavian  line is stable. Left subclavian Port-A-Cath is stable. Atherosclerotic changes are again noted at the aortic arch. Lung volumes remain low. Bilateral pleural effusions and basilar airspace opacities are present. There is improved aeration of the upper lung fields. IMPRESSION: 1. Support apparatus stable. 2. Similar appearance of bilateral pleural effusions and basilar airspace disease, likely atelectasis. 3. Previous aeration in the upper lung fields bilaterally. Electronically Signed   By: San Morelle M.D.   On: 12/02/2018 08:27    Labs: BMET Recent Labs  Lab 11/29/18 1600 11/30/18 0321 11/30/18 1152 11/30/18 1733 12/01/18 0222 12/01/18 2049 12/02/18 0307  NA 140 139 139 138 138  138 136 137  K 5.0 5.6* 4.8 5.9* 4.2  4.2 3.9 3.9  CL 105 105 103 104 102  102 102 102  CO2 24 23 21* 21* '25  25 24 22  ' GLUCOSE 134* 137* 133* 135* 201*  204* 238* 192*  BUN 36* 37* 31* 28* 29*  29* 33* 34*  CREATININE 1.74* 1.58* 1.44* 1.39* 1.19*  1.27* 1.27* 1.29*  CALCIUM 6.8* 6.8* 6.7* 6.7* 7.3*  7.2* 7.1* 7.4*  PHOS 3.4 3.2 2.7 2.9 2.4*  2.4* 1.8* 1.8*   CBC Recent Labs  Lab 12/05/2018 0052 12/06/2018 1934 11/29/18 0206 11/30/18 0321 12/01/18 0222  WBC 24.0* 27.7* 19.6* 23.4* 23.5*  NEUTROABS  22.6*  --   --   --  21.8*  HGB 9.9* 8.7* 8.0* 9.0* 9.9*  HCT 30.6* 26.0* 24.8* 26.5* 29.6*  MCV 91.1 90.0 90.8 89.2 89.2  PLT 13* 34* 35* 49* 67*    Medications:    . chlorhexidine gluconate (MEDLINE KIT)  15 mL Mouth Rinse BID  . Chlorhexidine Gluconate Cloth  6 each Topical Daily  . dorzolamide-timolol  1 drop Both Eyes BID  . heparin injection (subcutaneous)  5,000 Units Subcutaneous Q8H  . insulin aspart  3-9 Units Subcutaneous Q4H  . mouth rinse  15 mL Mouth Rinse 10 times per day  . pantoprazole (PROTONIX) IV  40 mg Intravenous Q24H      Madelon Lips, MD [place] pgr [phone] 12/02/2018, 12:03 PM

## 2018-12-02 NOTE — Progress Notes (Signed)
Spoke with MD Elsworth Soho, would like to keep subclavian line in place until possible extubation on 6/19. Will discuss accessing chest port or PICC line placement after extubation due to number of days with central line in place.   Will discuss need for urinary catheter with Nephrology upon rounding, patient currently on CRRT, monitoring I/Os and fluid removal.

## 2018-12-02 NOTE — Progress Notes (Signed)
  Spoke with Nephrologist MD Hollie Salk, will keep foley catheter in place to monitor urinary output and return/improvement of urinary function.  Patient remains critically ill and monitoring intake and output.

## 2018-12-02 NOTE — Progress Notes (Signed)
Nutrition Follow-up  DOCUMENTATION CODES:   Not applicable  INTERVENTION:  - continue TPN/TPN advancement per Pharmacy and Surgery's orders. - will monitor for changes in status and medical course.    NUTRITION DIAGNOSIS:   Inadequate oral intake related to inability to eat as evidenced by NPO status. -ongoing  GOAL:   Patient will meet greater than or equal to 90% of their needs -unmet at this time  MONITOR:   Vent status, Labs, Weight trends, Skin, I & O's, Other (Comment)(TPN regimen)  ASSESSMENT:   63 year old female with past medical history of type 2 DM, hyperlipidemia, HTN, inflammatory R breast cancer s/p radiation in 2015. Patient presented to the ED on 6/7 with severe abdominal pain, N/V. CT abdomen/pelvis which showed perforated small bowel with large amount of free air and multiple incarcerated incisional hernias.   Significant Events: 6/7- admission; ex lap, small bowel resection, LOAs, and placement of wound vac; NGT placement 6/9- CRRT initiation; return to OR for re-opening of open abdomen with washout 6/14- bowel anastomosis and closure of abdominal wall 6/16- initiation of TPN   Weight today is down from weight 6/9-6/17 and is only +1.2 kg/2.5 lb compared to admission (6/7) weight. Patient remains intubated with NGT to LIS with <100 ml output this shift. Able to communicate with RN at bedside about output and she reports that night shift reported 150 ml output overnight. Patient continues on CRRT.   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 Pharmacy note indicates goal rate will be 75 ml/hr which will provide 1515 kcal, 180 grams protein. Surgery requests that TPN not be advanced to goal rate until LFTs have improved. ILE currently on hold until 6/23.  Notes indicate that patient is nearing euvolemia, mentation is improving, and patient is tolerating spontaneous breathing trial.    Patient is currently  intubated on ventilator support MV: 8.9 L/min Temp (24hrs), Avg:97.8 F (36.6 C), Min:97.1 F (36.2 C), Max:98.4 F (36.9 C)   Medications reviewed; sliding scale novolog, 20 mmol IV KPhos x1 run 6/18. Labs reviewed; CBGs: 165 and 151 mg/dl today, BUN: 34 mg/dl, creatinine: 1.29 mg/dl, Ca: 7.4 mg/dl, Phos: 1.8 mg/dl, Alk Phos and ALT elevated but continue to trend down, GFR: 51 ml/min.     Diet Order:   Diet Order    None      EDUCATION NEEDS:   No education needs have been identified at this time  Skin:  Skin Assessment: Skin Integrity Issues: Skin Integrity Issues:: Stage II Stage II: L ear Wound Vac: abdomen Incisions: abdomen (6/7, 6/9, 6/13)  Last BM:  6/10  Height:   Ht Readings from Last 1 Encounters:  12/01/18 _0  (1.626 m)    Weight:   Wt Readings from Last 1 Encounters:  12/02/18 76 kg    Ideal Body Weight:  54.5 kg  BMI:  Body mass index is 28.76 kg/m.  Estimated Nutritional Needs:   Kcal:  1462 kcal  Protein:  172-187 grams (2.3-2.5 grams/kg)  Fluid:  >/= 1.8 L/day     Jarome Matin, MS, RD, LDN, [facility] Inpatient Clinical Dietitian Pager # [phone] After hours/weekend pager # [phone]

## 2018-12-03 LAB — HEPATIC FUNCTION PANEL
ALT: 89 U/L — ABNORMAL HIGH (ref 0–44)
AST: 37 U/L (ref 15–41)
Albumin: 1.4 g/dL — ABNORMAL LOW (ref 3.5–5.0)
Alkaline Phosphatase: 371 U/L — ABNORMAL HIGH (ref 38–126)
Bilirubin, Direct: 0.6 mg/dL — ABNORMAL HIGH (ref 0.0–0.2)
Indirect Bilirubin: 0.5 mg/dL (ref 0.3–0.9)
Total Bilirubin: 1.1 mg/dL (ref 0.3–1.2)
Total Protein: 4.8 g/dL — ABNORMAL LOW (ref 6.5–8.1)

## 2018-12-03 LAB — RENAL FUNCTION PANEL
Albumin: 1.4 g/dL — ABNORMAL LOW (ref 3.5–5.0)
Albumin: 1.4 g/dL — ABNORMAL LOW (ref 3.5–5.0)
Anion gap: 8 (ref 5–15)
Anion gap: 8 (ref 5–15)
BUN: 40 mg/dL — ABNORMAL HIGH (ref 8–23)
BUN: 39 mg/dL — ABNORMAL HIGH (ref 8–23)
CO2: 24 mmol/L (ref 22–32)
CO2: 25 mmol/L (ref 22–32)
Calcium: 7 mg/dL — ABNORMAL LOW (ref 8.9–10.3)
Calcium: 7.3 mg/dL — ABNORMAL LOW (ref 8.9–10.3)
Chloride: 103 mmol/L (ref 98–111)
Chloride: 102 mmol/L (ref 98–111)
Creatinine, Ser: 1.2 mg/dL — ABNORMAL HIGH (ref 0.44–1.00)
Creatinine, Ser: 0.99 mg/dL (ref 0.44–1.00)
GFR calc Af Amer: 55 mL/min — ABNORMAL LOW (ref >60)
GFR calc Af Amer: >60 mL/min (ref >60)
GFR calc non Af Amer: 48 mL/min — ABNORMAL LOW (ref >60)
GFR calc non Af Amer: >60 mL/min (ref >60)
Glucose, Bld: 256 mg/dL — ABNORMAL HIGH (ref 70–99)
Glucose, Bld: 185 mg/dL — ABNORMAL HIGH (ref 70–99)
Phosphorus: 2.9 mg/dL (ref 2.5–4.6)
Phosphorus: 2.6 mg/dL (ref 2.5–4.6)
Potassium: 3.8 mmol/L (ref 3.5–5.1)
Potassium: 3.7 mmol/L (ref 3.5–5.1)
Sodium: 135 mmol/L (ref 135–145)
Sodium: 135 mmol/L (ref 135–145)

## 2018-12-03 LAB — BASIC METABOLIC PANEL
Anion gap: 8 (ref 5–15)
BUN: 41 mg/dL — ABNORMAL HIGH (ref 8–23)
CO2: 24 mmol/L (ref 22–32)
Calcium: 7 mg/dL — ABNORMAL LOW (ref 8.9–10.3)
Chloride: 103 mmol/L (ref 98–111)
Creatinine, Ser: 1.24 mg/dL — ABNORMAL HIGH (ref 0.44–1.00)
GFR calc Af Amer: 53 mL/min — ABNORMAL LOW (ref >60)
GFR calc non Af Amer: 46 mL/min — ABNORMAL LOW (ref >60)
Glucose, Bld: 254 mg/dL — ABNORMAL HIGH (ref 70–99)
Potassium: 3.8 mmol/L (ref 3.5–5.1)
Sodium: 135 mmol/L (ref 135–145)

## 2018-12-03 LAB — CBC
HCT: 24.2 % — ABNORMAL LOW (ref 36.0–46.0)
Hemoglobin: 8 g/dL — ABNORMAL LOW (ref 12.0–15.0)
MCH: 30.1 pg (ref 26.0–34.0)
MCHC: 33.1 g/dL (ref 30.0–36.0)
MCV: 91 fL (ref 80.0–100.0)
Platelets: 66 10*3/uL — ABNORMAL LOW (ref 150–400)
RBC: 2.66 MIL/uL — ABNORMAL LOW (ref 3.87–5.11)
RDW: 15.9 % — ABNORMAL HIGH (ref 11.5–15.5)
WBC: 27.4 10*3/uL — ABNORMAL HIGH (ref 4.0–10.5)
nRBC: 0.2 % (ref 0.0–0.2)

## 2018-12-03 LAB — GLUCOSE, CAPILLARY
Glucose-Capillary: 154 mg/dL — ABNORMAL HIGH (ref 70–99)
Glucose-Capillary: 161 mg/dL — ABNORMAL HIGH (ref 70–99)
Glucose-Capillary: 162 mg/dL — ABNORMAL HIGH (ref 70–99)
Glucose-Capillary: 163 mg/dL — ABNORMAL HIGH (ref 70–99)
Glucose-Capillary: 175 mg/dL — ABNORMAL HIGH (ref 70–99)
Glucose-Capillary: 183 mg/dL — ABNORMAL HIGH (ref 70–99)
Glucose-Capillary: 242 mg/dL — ABNORMAL HIGH (ref 70–99)

## 2018-12-03 LAB — PHOSPHORUS: Phosphorus: 2.9 mg/dL (ref 2.5–4.6)

## 2018-12-03 LAB — MAGNESIUM: Magnesium: 2.1 mg/dL (ref 1.7–2.4)

## 2018-12-03 MED ORDER — CHLORHEXIDINE GLUCONATE 0.12 % MT SOLN
15.0000 mL | Freq: Two times a day (BID) | OROMUCOSAL | Status: DC
  Administered 2018-12-03 – 2018-12-04 (×2): 15 mL via OROMUCOSAL
  Filled 2018-12-03: qty 15

## 2018-12-03 MED ORDER — TRACE MINERALS CR-CU-MN-SE-ZN 10-1000-500-60 MCG/ML IV SOLN
INTRAVENOUS | Status: AC
  Administered 2018-12-03: 17:00:00 via INTRAVENOUS
  Filled 2018-12-03: qty 1200

## 2018-12-03 MED ORDER — ORAL CARE MOUTH RINSE
15.0000 mL | Freq: Two times a day (BID) | OROMUCOSAL | Status: DC
  Administered 2018-12-03: 16:00:00 15 mL via OROMUCOSAL

## 2018-12-03 NOTE — Progress Notes (Signed)
[facility] Progress Note    Assessment/ Plan:   1. Acute renal failure - in setting of septic shock due to perf bowel, suspected ATN. SP CRRT 6/9 - 6/12, held after PEA arrest.  No UOP. Resumed 6/14 after abdominal closure, doing well.   No heparin.  Volume status improving.  Will d/c CRRT likely tomorrow AM if remains off pressors.       2. SP PEA arrest: on 6/13 in ICU 3. Acute hypoxic respiratory failure: weaning as able, per PCCM 4. Septic shock: s/p Zosyn and fluconazole 5. Perforated small bowel due to closed-loop SBO / septic shock- per CCM/ gen surg, s/p closure 6/14. NGT in place 6. DM on insulin 7. Severe hypoalbuminemia: per primary-- on TPN 8. Dispo: remains in ICU  Subjective:    Extubated.  Pressures are a little soft--> keeping net even.       Objective:   BP 100/61   Pulse 91   Temp (!) 97.4 F (36.3 C) (Oral)   Resp (!) 25   Ht 5\' 4"  (1.626 m)   Wt 74.2 kg   SpO2 100%   BMI 28.08 kg/m   Intake/Output Summary (Last 24 hours) at 12/03/2018 1412 Last data filed at 12/03/2018 1400 Gross per 24 hour  Intake 2078.01 ml  Output 2499 ml  Net -420.99 ml   Weight change: -1.8 kg  Physical Exam: Genextubated, NAD, on Bair hugger NECK: no JVD  PULM: clear anteriorly CV: RRR no MRG ABD: soft, abd wall edema, dressings c/d/i, mildly diffusely tender GUfoley cath minimal urine, better than yesterday Extanasarca improving Neuro: able to respond to questions  Imaging: Dg Chest Port 1 View  Result Date: 12/02/2018 CLINICAL DATA:  Endotracheal tube in place. EXAM: PORTABLE CHEST 1 VIEW COMPARISON:  One-view chest x-ray 11/30/2018 FINDINGS: Endotracheal tube is stable, 4 cm above the carina. Right IJ sheath is stable. A right subclavian line is stable. Left subclavian Port-A-Cath is stable. Atherosclerotic changes are again noted at the aortic arch. Lung volumes remain low. Bilateral pleural effusions and basilar airspace opacities are present.  There is improved aeration of the upper lung fields. IMPRESSION: 1. Support apparatus stable. 2. Similar appearance of bilateral pleural effusions and basilar airspace disease, likely atelectasis. 3. Previous aeration in the upper lung fields bilaterally. Electronically Signed   By: San Morelle M.D.   On: 12/02/2018 08:27    Labs: BMET Recent Labs  Lab 11/30/18 1152 11/30/18 1733 12/01/18 0222 12/01/18 2049 12/02/18 0307 12/02/18 1606 12/03/18 0325  NA 139 138 138  138 136 137 135 135  135  K 4.8 5.9* 4.2  4.2 3.9 3.9 4.2 3.8  3.8  CL 103 104 102  102 102 102 101 103  103  CO2 21* 21* 25  25 24 22 24 24  24   GLUCOSE 133* 135* 201*  204* 238* 192* 220* 254*  256*  BUN 31* 28* 29*  29* 33* 34* 32* 41*  40*  CREATININE 1.44* 1.39* 1.19*  1.27* 1.27* 1.29* 1.12* 1.24*  1.20*  CALCIUM 6.7* 6.7* 7.3*  7.2* 7.1* 7.4* 7.3* 7.0*  7.0*  PHOS 2.7 2.9 2.4*  2.4* 1.8* 1.8* 3.3 2.9  2.9   CBC Recent Labs  Lab 12/12/2018 0052  11/29/18 0206 11/30/18 0321 12/01/18 0222 12/03/18 0325  WBC 24.0*   < > 19.6* 23.4* 23.5* 27.4*  NEUTROABS 22.6*  --   --   --  21.8*  --   HGB 9.9*   < >  8.0* 9.0* 9.9* 8.0*  HCT 30.6*   < > 24.8* 26.5* 29.6* 24.2*  MCV 91.1   < > 90.8 89.2 89.2 91.0  PLT 13*   < > 35* 49* 67* 66*   < > = values in this interval not displayed.    Medications:    . chlorhexidine  15 mL Mouth Rinse BID  . Chlorhexidine Gluconate Cloth  6 each Topical Daily  . dorzolamide-timolol  1 drop Both Eyes BID  . heparin injection (subcutaneous)  5,000 Units Subcutaneous Q8H  . insulin aspart  3-9 Units Subcutaneous Q4H  . mouth rinse  15 mL Mouth Rinse q12n4p  . pantoprazole (PROTONIX) IV  40 mg Intravenous Q24H      Madelon Lips, MD [facility] pgr [phone] 12/03/2018, 2:12 PM

## 2018-12-03 NOTE — Progress Notes (Signed)
Assisted tele visit to patient with family member.  Yamari Ventola Ann, RN  

## 2018-12-03 NOTE — Procedures (Signed)
Extubation Procedure Note  Patient Details:   Name: Carmen Stone DOB: 02/19/1956 MRN: [id number]   Airway Documentation:    Vent end date: (not recorded) Vent end time: (not recorded)   Evaluation  O2 sats: stable throughout Complications: No apparent complications Patient did tolerate procedure well. Bilateral Breath Sounds: Diminished   Yes  Elsie Stain 12/03/2018, 8:27 AM

## 2018-12-03 NOTE — Progress Notes (Signed)
[facility] NOTE   Pharmacy Consult for TPN Indication: prolonged ileus  Patient Measurements: Height: 5\' 4"  (162.6 cm) Weight: 163 lb 9.3 oz (74.2 kg) IBW/kg (Calculated) : 54.7 TPN AdjBW (KG): 59.7 Body mass index is 28.08 kg/m. Usual Weight: 90kg  Insulin Requirements: 30 units in past 24 hours  Current Nutrition: NPO  IVF: none  Central access: 12/11/2018 TPN start date: 11/30/18  ASSESSMENT                                                                                                          HPI: She presented 6/7 with perforated small bowel from closed-loop obstruction and underwent emergent exploratory laparotomy.  She returned to OR on 6/9 and again on 6/14 for closure of abdominal wall. She coded on 09-Dec-2022 and developed multiorgan failure.  She remains intubated & on CRRT.  Pressors have been weaned off and she is completing antibiotic course for sepsis/PNA.   She has been NPO since admission on 6/7.  Pharmacy consulted to start TPN.  Acute liver injury is improving but will initiate TPN at low rate & advance slowly as requested by surgery.    Significant events:  6/18: OK to increase to goal rate per surgery  Today:   Glucose - Hx DM on 70/30 Novolog 20 units BID PTA.  CBGs now above goal but with increase of TPN to goal rate with 25 units insulin in TPN + ICU resistant scale (range 95-256).  Electrolytes - WNL.  Phos at goal after 20Mmol Kphos IV x1 yesterday. Also increase in dialysate K to 15mEq.  Renal - AKI on CRRT.  265ml NG output on 6/18.  LFTs - Acute liver injury- AST/ALT elevated but trending down (41/127).  TBili 1.5 (on 6/18)  TGs - 85 (6/9)  Prealbumin - <5 (6/17)  NUTRITIONAL GOALS                                                                                             RD recs:  Kcal:  1530 kcal, Protein:  172-187 grams (2.3-2.5 grams/kg), Fluid:  >/= 1.8 L/day  Custom TPN at goal rate of 75 ml/hr (NO  LIPIDS x7 days) to provide: 180 g/day protein, 1515 Kcal/day.  PLAN  At 1800 today:  Custom high protein, hypocaloric TPN per recommendations for obese, critically ill patient.  Continue TPN at 75 ml/hr.  Electrolytes in TPN as follows:  Na 65mEq/L, Phos 20Mmol/L, Ca 20mEq/L, NO K or Mag.  Max acetate.  Hold 20% lipid emulsion for first 7 days for ICU patients per ASPEN guidelines (Start date 6/23)  TPN to contain standard multivitamins.  Add trace elements on MWF only due to national backorder.  MIVF per MD- currently none  Continue resistant ICU glycemic control orders q4h.   Increase to  40 units of insulin in TPN  TPN lab panels Mondays & Thursdays.    F/u daily.  Noted plan for tube feeding trial once NG output improves.   Jamyria, [place] 12/03/2018,7:22 AM

## 2018-12-03 NOTE — Progress Notes (Signed)
Assisted tele visit to patient with daughter.  Silvino Selman Ann, RN  

## 2018-12-03 NOTE — Progress Notes (Signed)
5 Days Post-Op  Subjective: CC:  Pt extubated, answers questions  Objective: Vital signs in last 24 hours: Temp:  [97.2 F (36.2 C)-97.7 F (36.5 C)] 97.4 F (36.3 C) (06/19 0800) Pulse Rate:  [92-110] 98 (06/19 0900) Resp:  [15-31] 27 (06/19 0900) BP: (80-154)/(41-99) 108/53 (06/19 0900) SpO2:  [100 %] 100 % (06/19 0900) FiO2 (%):  [30 %] 30 % (06/19 0730) Weight:  [74.2 kg] 74.2 kg (06/19 0500) Last BM Date: 11/24/18  Intake/Output from previous day: 06/18 0701 - 06/19 0700 In: 2154.2 [I.V.:1648.9; IV Piggyback:505.3] Out: 3663 [Emesis/NG output:200] Intake/Output this shift: Total I/O In: 170 [I.V.:170] Out: 109 [Emesis/NG output:100; Other:9]  PE: Gen: critically ill on vent, opens eyes. Central line in [place], HD cath in right neck.  Abd: soft, non-distended Ext: SCD in place.  Lab Results:  Recent Labs    12/01/18 0222 12/03/18 0325  WBC 23.5* 27.4*  HGB 9.9* 8.0*  HCT 29.6* 24.2*  PLT 67* 66*   BMET Recent Labs    12/02/18 1606 12/03/18 0325  NA 135 135  135  K 4.2 3.8  3.8  CL 101 103  103  CO2 24 24  24   GLUCOSE 220* 254*  256*  BUN 32* 41*  40*  CREATININE 1.12* 1.24*  1.20*  CALCIUM 7.3* 7.0*  7.0*   PT/INR No results for input(s): LABPROT, INR in the last 72 hours. CMP     Component Value Date/Time   NA 135 12/03/2018 0325   NA 135 12/03/2018 0325   NA 138 06/01/2017 0859   K 3.8 12/03/2018 0325   K 3.8 12/03/2018 0325   K 4.2 06/01/2017 0859   CL 103 12/03/2018 0325   CL 103 12/03/2018 0325   CO2 24 12/03/2018 0325   CO2 24 12/03/2018 0325   CO2 24 06/01/2017 0859   GLUCOSE 256 (H) 12/03/2018 0325   GLUCOSE 254 (H) 12/03/2018 0325   GLUCOSE 118 06/01/2017 0859   BUN 40 (H) 12/03/2018 0325   BUN 41 (H) 12/03/2018 0325   BUN 15.2 06/01/2017 0859   CREATININE 1.20 (H) 12/03/2018 0325   CREATININE 1.24 (H) 12/03/2018 0325   CREATININE 0.79 06/02/2018 0856   CREATININE 0.7 06/01/2017 0859   CALCIUM 7.0 (L) 12/03/2018  0325   CALCIUM 7.0 (L) 12/03/2018 0325   CALCIUM 9.0 06/01/2017 0859   PROT 4.8 (L) 12/03/2018 0325   PROT 7.0 06/01/2017 0859   ALBUMIN 1.4 (L) 12/03/2018 0325   ALBUMIN 1.4 (L) 12/03/2018 0325   ALBUMIN 3.7 06/01/2017 0859   AST 37 12/03/2018 0325   AST 13 (L) 06/02/2018 0856   AST 14 06/01/2017 0859   ALT 89 (H) 12/03/2018 0325   ALT 16 06/02/2018 0856   ALT 12 06/01/2017 0859   ALKPHOS 371 (H) 12/03/2018 0325   ALKPHOS 97 06/01/2017 0859   BILITOT 1.1 12/03/2018 0325   BILITOT 0.3 06/02/2018 0856   BILITOT 0.22 06/01/2017 0859   GFRNONAA 48 (L) 12/03/2018 0325   GFRNONAA 46 (L) 12/03/2018 0325   GFRNONAA >60 06/02/2018 0856   GFRAA 55 (L) 12/03/2018 0325   GFRAA 53 (L) 12/03/2018 0325   GFRAA >60 06/02/2018 0856   Lipase     Component Value Date/Time   LIPASE 24 12/02/2018 0355       Studies/Results: Dg Chest Port 1 View  Result Date: 12/02/2018 CLINICAL DATA:  Endotracheal tube in place. EXAM: PORTABLE CHEST 1 VIEW COMPARISON:  One-view chest x-ray 11/30/2018 FINDINGS: Endotracheal tube  is stable, 4 cm above the carina. Right IJ sheath is stable. A right subclavian line is stable. Left subclavian Port-A-Cath is stable. Atherosclerotic changes are again noted at the aortic arch. Lung volumes remain low. Bilateral pleural effusions and basilar airspace opacities are present. There is improved aeration of the upper lung fields. IMPRESSION: 1. Support apparatus stable. 2. Similar appearance of bilateral pleural effusions and basilar airspace disease, likely atelectasis. 3. Previous aeration in the upper lung fields bilaterally. Electronically Signed   By: San Morelle M.D.   On: 12/02/2018 08:27    Anti-infectives: Anti-infectives (From admission, onward)   Start     Dose/Rate Route Frequency Ordered Stop   12/04/2018 1800  fluconazole (DIFLUCAN) IVPB 200 mg  Status:  Discontinued     200 mg 100 mL/hr over 60 Minutes Intravenous Every 24 hours 12/11/2018 0748  12/12/2018 0847   12/03/2018 1800  fluconazole (DIFLUCAN) IVPB 400 mg  Status:  Discontinued     400 mg 100 mL/hr over 120 Minutes Intravenous Every 24 hours 11/30/2018 0847 11/16/2018 0856   11/19/2018 1400  piperacillin-tazobactam (ZOSYN) IVPB 2.25 g  Status:  Discontinued     2.25 g 100 mL/hr over 30 Minutes Intravenous Every 6 hours 12/01/2018 0748 12/01/18 1104   12/11/2018 0600  cefoTEtan (CEFOTAN) 2 g in sodium chloride 0.9 % 100 mL IVPB     2 g 200 mL/hr over 30 Minutes Intravenous On call to O.R. 11/22/18 1300 11/24/2018 0624   11/22/18 1800  fluconazole (DIFLUCAN) IVPB 400 mg  Status:  Discontinued     400 mg 100 mL/hr over 120 Minutes Intravenous Every 24 hours 11/20/2018 1528 12/14/2018 0748   11/22/18 1800  vancomycin (VANCOCIN) 1,500 mg in sodium chloride 0.9 % 500 mL IVPB  Status:  Discontinued     1,500 mg 250 mL/hr over 120 Minutes Intravenous Every 24 hours 11/22/18 0358 11/22/18 0750   11/22/18 0115  vancomycin (VANCOCIN) 1,500 mg in sodium chloride 0.9 % 500 mL IVPB     1,500 mg 250 mL/hr over 120 Minutes Intravenous  Once 11/22/18 0101 11/22/18 0319   12/09/2018 1630  fluconazole (DIFLUCAN) IVPB 800 mg     800 mg 100 mL/hr over 240 Minutes Intravenous  Once 12/03/2018 1527 11/29/2018 2013   11/22/2018 1600  piperacillin-tazobactam (ZOSYN) IVPB 3.375 g  Status:  Discontinued     3.375 g 12.5 mL/hr over 240 Minutes Intravenous Every 8 hours 11/15/2018 1522 11/24/2018 0748   11/18/2018 0545  piperacillin-tazobactam (ZOSYN) IVPB 3.375 g     3.375 g 100 mL/hr over 30 Minutes Intravenous  Once 11/20/2018 0530 11/19/2018 0626   12/04/2018 0530  piperacillin-tazobactam (ZOSYN) IVPB 4.5 g  Status:  Discontinued     4.5 g 200 mL/hr over 30 Minutes Intravenous  Once 12/03/2018 0517 12/12/2018 0529       Assessment/Plan HTN HLD DM Hypothyroidism H/o right inflammatory breast cancer s/p mastectomy and chemo/radiation Anemia - hgb 9.9 6/17 ARF-on CRRT,Cr1.29,no UOP, K 3.9 Hypocalcemia -defer to nephro  given CRRT Thrombocytopenia- [id number] Septic shock - onvent, pressorsoff Appreciate CCM assistance   Perforated small bowel from closed-loop obstruction 1.S/pEXPLORATORY LAPAROTOMY,SMALL BOWEL RESECTION,LYSIS OF ADHESIONS (1.5 HOURS),PLACEMENT OF WOUND VAC6/7 Dr. Ninfa Linden - POD #12 2. S/pReexploration of [facility] lapar[facility]9 Dr. Rosendo Gros - POD #10 3. S/pExploratory laparotomy with washout of abdomen and placement of vacuum VAC dressing6/11 Dr. Brantley Stage - POD #8 4. Exploratory laparotomy with anastomosis of SB and closure of abdominal wall 6/14 Dr. Brantley Stage - POD #  5 - Cont NGT.  Outputs are too high for tube feeds at this point - Cont TNA today. Continue to trend CMET - Cont BID NSWD dressing changes to midline wound - No fevers. WBC increasing.May need CT if this does not improve. No change in ABX therapy at this time from an abdominal standpoint  ID -diflucan 6/7-6/11, vancomycin 6/8-6/8. zosyn 6/7>> (day 12)  FEN -IVF, NPO/NGT. TNA  VTE -SCDs, heparin Foley -in place Contact - brother GeorgeMattier([phone] or [phone])   LOS: 12 days   Rosario Adie, MD  [facility]

## 2018-12-03 NOTE — Progress Notes (Signed)
pT EXTUBATED TO 4LNC PER PROTOCOL AND DR. SOODS ORDERS. PT'S LEAK TEST GOOD,ABLE TO FOLLOW DIRECTION, PT WAS ABLE TO SPEAK AND COUGH. RT DECREASED THE PT'S O2 TO 2L AND SATS ARE STILL 100%. RT WILL CONTINUE TO MONITOR

## 2018-12-03 NOTE — Progress Notes (Signed)
NAME:  Carmen Stone, MRN:  [id number], DOB:  [birth year], LOS: 12 ADMISSION DATE:  11/30/2018, CONSULTATION DATE:  11/21/2018 REFERRING MD:  Dr. Ninfa Linden, Surgery, CHIEF COMPLAINT:  Abdominal pain   Brief History   63 yo female presented to ER with nausea, vomiting, abdominal pain.  CT abd/pelvis showed focal perforated loop of SB with free air with multiple complex ventral hernias.  Had emergent laparotomy with SB resection, lysis of adhesions and wound vac.  Remained on vent and pressors post op.  Cardiorespiratory arrest 12/14/2018-lasted a couple of minutes  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, HTN, HLD, DM type II  Significant Hospital Events   6/07 Admit, to OR 6/08 transfuse FFP, on multiple pressors 6/09 To OR for wash out, start CRRT 6/10: Diflucan discontinued due to rising liver function tests 6/12-still on CRRT, no urine output 6/13-acute respiratory arrest overnight, off CRRT, on pressors, limit sedation 6/14 bowel anastomosis and closure of abdominal wall 6/15: Off pressors.  Changing volume goal on CRRT to -100 cc an hour.  Starting to wean.  Fentanyl infusion stopped changed to PRN.  7 hours weaning 6/16 remains encephalopathic.  Requiring intermittent fentanyl.  Tolerating clears to 200 cc negative fluid balance hourly.  She is hypertensive, adding labetalol.  IV TNA started 6/17: A little more awake.  Tolerating pressure support but SBI exceeds 105 on spontaneous breathing trial.  Continuing aggressive volume removal with CRRT, volume status now down to just around 2 L positive.  Decreasing PRN sedation further 6/18: Neuro status continues to improve.  Hemodynamics stable however did require brief vasoactive drips during nighttime.  She is now at an net negative fluid volume status.  Likely nearing euvolemic state.  Tolerating spontaneous breathing trial 6/19 Extubated  Consults:  Renal 6/09 AKI, acidosis Cardiology 6/09  Takotsubo CM   Procedures:  ETT 6/07 >> 6/19 Rt IJ HD 6/09 >> Rt Power CVL 6/09 >> Lt radial aline 6/09 >>  Significant Diagnostic Tests:  CT abd/pelvis 6/07 >> focal perforated loop of SB with free air with multiple complex ventral hernias Echo 6/08 >> EF 20%, Takotsubo CM  Micro Data:  COVID 6/07 >> negative  Antimicrobials:  Zosyn 6/07 >> 6/17 Diflucan 6/07 -6/10  Interim history/subjective:  Remains on vent, CRRT.  On pressure support.  Objective   Blood pressure 124/74, pulse (!) 103, temperature 97.7 F (36.5 C), temperature source Axillary, resp. rate (!) 26, height 5\' 4"  (1.626 m), weight 74.2 kg, SpO2 100 %. CVP:  [3 mmHg-4 mmHg] 4 mmHg  Vent Mode: PSV;CPAP FiO2 (%):  [30 %] 30 % Set Rate:  [20 bmp] 20 bmp Vt Set:  [430 mL] 430 mL PEEP:  [5 cmH20] 5 cmH20 Pressure Support:  [5 cmH20] 5 cmH20 Plateau Pressure:  [19 cmH20-21 cmH20] 20 cmH20   Intake/Output Summary (Last 24 hours) at 12/03/2018 0804 Last data filed at 12/03/2018 0800 Gross per 24 hour  Intake 2188.62 ml  Output 3433 ml  Net -1244.38 ml   Filed Weights   12/01/18 0305 12/02/18 0407 12/03/18 0500  Weight: 82.5 kg 76 kg 74.2 kg    Examination:  General - alert Eyes - pupils reactive ENT - ETT, NG in. Cardiac - regular rate/rhythm, no murmur Chest - equal breath sounds b/l, no wheezing or rales Abdomen - wound dressing clean Extremities - 1+ edema Skin - no rashes Neuro - follows commands, moves extremities  Resolved Hospital Problem list   Metabolic alkalosis Abdominal  peritonitis with septic shock Circulatory shock, mix of cardiogenic and sepsis off pressors as of 6/15 Cardiorespiratory arrest 6/13 Shock liver Acute metabolic encephalopathy Hypophosphatemia  Assessment & Plan:   Status post small bowel resection, lysis of adhesions, status post reexploration on 6/14 with creation of side-to-side functional end-to-end small bowel anastomosis and closure of abdominal wall. 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 Plan - might be able to transition off CRRT >> defer to renal - even to negative fluid balance as able  Type 2 diabetes. Plan - hyperglycemia 3 protocol  Thrombocytopenia from sepsis. Plan - f/u CBC   Best practice:  Diet: N.p.o., TNA started 6/15 DVT prophylaxis: SCDs, subcu heparin GI prophylaxis: Protonix Mobility: Bedrest Code Status: Full code Disposition: ICU  Labs:   CMP Latest Ref Rng & Units 12/03/2018 12/03/2018 12/02/2018  Glucose 70 - 99 mg/dL 254(H) 256(H) 220(H)  BUN 8 - 23 mg/dL 41(H) 40(H) 32(H)  Creatinine 0.44 - 1.00 mg/dL 1.24(H) 1.20(H) 1.12(H)  Sodium 135 - 145 mmol/L 135 135 135  Potassium 3.5 - 5.1 mmol/L 3.8 3.8 4.2  Chloride 98 - 111 mmol/L 103 103 101  CO2 22 - 32 mmol/L 24 24 24   Calcium 8.9 - 10.3 mg/dL 7.0(L) 7.0(L) 7.3(L)  Total Protein 6.5 - 8.1 g/dL - 4.8(L) -  Total Bilirubin 0.3 - 1.2 mg/dL - 1.1 -  Alkaline Phos 38 - 126 U/L - 371(H) -  AST 15 - 41 U/L - 37 -  ALT 0 - 44 U/L - 89(H) -   CBC Latest Ref Rng & Units 12/03/2018 12/01/2018 11/30/2018  WBC 4.0 - 10.5 K/uL 27.4(H) 23.5(H) 23.4(H)  Hemoglobin 12.0 - 15.0 g/dL 8.0(L) 9.9(L) 9.0(L)  Hematocrit 36.0 - 46.0 % 24.2(L) 29.6(L) 26.5(L)  Platelets 150 - 400 K/uL 66(L) 67(L) 49(L)   ABG    Component Value Date/Time   PHART 7.351 11/20/2018 0052   PCO2ART 43.3 11/22/2018 0052   PO2ART 124 (H) 11/24/2018 0052   HCO3 23.4 11/22/2018 0052   TCO2 16 (L) 11/20/2018  1658   ACIDBASEDEF 1.7 12/02/2018 0052   O2SAT 97.9 11/20/2018 0052   CBG (last 3)  Recent Labs    12/03/18 0018 12/03/18 0322 12/03/18 0732  GLUCAP 175* 242* 162*    CC time 33 minutes  Chesley Mires, MD [facility] 12/03/2018, 8:09 AM

## 2018-12-04 ENCOUNTER — Inpatient Hospital Stay (HOSPITAL_COMMUNITY): Payer: BLUE CROSS/BLUE SHIELD

## 2018-12-04 ENCOUNTER — Inpatient Hospital Stay (HOSPITAL_COMMUNITY): Payer: BLUE CROSS/BLUE SHIELD | Admitting: Registered Nurse

## 2018-12-04 LAB — BLOOD GAS, ARTERIAL
Acid-Base Excess: 1.7 mmol/L (ref 0.0–2.0)
Acid-base deficit: 0.8 mmol/L (ref 0.0–2.0)
Bicarbonate: 25.8 mmol/L (ref 20.0–28.0)
Bicarbonate: 25.3 mmol/L (ref 20.0–28.0)
Drawn by: 308601
Drawn by: 308601
FIO2: 50
MECHVT: 430 mL
O2 Content: 4 L/min
O2 Saturation: 95.8 %
O2 Saturation: 83.6 %
PEEP: 5 cmH2O
Patient temperature: 37
Patient temperature: 37
RATE: 18 resp/min
pCO2 arterial: 57.6 mmHg — ABNORMAL HIGH (ref 32.0–48.0)
pCO2 arterial: 37.4 mmHg (ref 32.0–48.0)
pH, Arterial: 7.273 — ABNORMAL LOW (ref 7.350–7.450)
pH, Arterial: 7.445 (ref 7.350–7.450)
pO2, Arterial: 97.4 mmHg (ref 83.0–108.0)
pO2, Arterial: 48.6 mmHg — ABNORMAL LOW (ref 83.0–108.0)

## 2018-12-04 LAB — CBC
HCT: 24.2 % — ABNORMAL LOW (ref 36.0–46.0)
Hemoglobin: 7.8 g/dL — ABNORMAL LOW (ref 12.0–15.0)
MCH: 30 pg (ref 26.0–34.0)
MCHC: 32.2 g/dL (ref 30.0–36.0)
MCV: 93.1 fL (ref 80.0–100.0)
Platelets: 76 10*3/uL — ABNORMAL LOW (ref 150–400)
RBC: 2.6 MIL/uL — ABNORMAL LOW (ref 3.87–5.11)
RDW: 16.3 % — ABNORMAL HIGH (ref 11.5–15.5)
WBC: 31.7 10*3/uL — ABNORMAL HIGH (ref 4.0–10.5)
nRBC: 0.1 % (ref 0.0–0.2)

## 2018-12-04 LAB — RENAL FUNCTION PANEL
Albumin: 1.5 g/dL — ABNORMAL LOW (ref 3.5–5.0)
Albumin: 1.5 g/dL — ABNORMAL LOW (ref 3.5–5.0)
Anion gap: 8 (ref 5–15)
Anion gap: 9 (ref 5–15)
BUN: 40 mg/dL — ABNORMAL HIGH (ref 8–23)
BUN: 42 mg/dL — ABNORMAL HIGH (ref 8–23)
CO2: 25 mmol/L (ref 22–32)
CO2: 23 mmol/L (ref 22–32)
Calcium: 7.6 mg/dL — ABNORMAL LOW (ref 8.9–10.3)
Calcium: 7.2 mg/dL — ABNORMAL LOW (ref 8.9–10.3)
Chloride: 103 mmol/L (ref 98–111)
Chloride: 104 mmol/L (ref 98–111)
Creatinine, Ser: 1.02 mg/dL — ABNORMAL HIGH (ref 0.44–1.00)
Creatinine, Ser: 0.98 mg/dL (ref 0.44–1.00)
GFR calc Af Amer: >60 mL/min (ref >60)
GFR calc Af Amer: >60 mL/min (ref >60)
GFR calc non Af Amer: 58 mL/min — ABNORMAL LOW (ref >60)
GFR calc non Af Amer: >60 mL/min (ref >60)
Glucose, Bld: 190 mg/dL — ABNORMAL HIGH (ref 70–99)
Glucose, Bld: 113 mg/dL — ABNORMAL HIGH (ref 70–99)
Phosphorus: 3.2 mg/dL (ref 2.5–4.6)
Phosphorus: 2.5 mg/dL (ref 2.5–4.6)
Potassium: 4.2 mmol/L (ref 3.5–5.1)
Potassium: 3.7 mmol/L (ref 3.5–5.1)
Sodium: 136 mmol/L (ref 135–145)
Sodium: 136 mmol/L (ref 135–145)

## 2018-12-04 LAB — GLUCOSE, CAPILLARY
Glucose-Capillary: 102 mg/dL — ABNORMAL HIGH (ref 70–99)
Glucose-Capillary: 135 mg/dL — ABNORMAL HIGH (ref 70–99)
Glucose-Capillary: 155 mg/dL — ABNORMAL HIGH (ref 70–99)
Glucose-Capillary: 160 mg/dL — ABNORMAL HIGH (ref 70–99)
Glucose-Capillary: 161 mg/dL — ABNORMAL HIGH (ref 70–99)
Glucose-Capillary: 96 mg/dL (ref 70–99)
Glucose-Capillary: 97 mg/dL (ref 70–99)

## 2018-12-04 LAB — MAGNESIUM: Magnesium: 2.4 mg/dL (ref 1.7–2.4)

## 2018-12-04 MED ORDER — CHLORHEXIDINE GLUCONATE 0.12% ORAL RINSE (MEDLINE KIT)
15.0000 mL | Freq: Two times a day (BID) | OROMUCOSAL | Status: DC
  Administered 2018-12-04 – 2018-12-05 (×3): 15 mL via OROMUCOSAL

## 2018-12-04 MED ORDER — STERILE WATER FOR INJECTION IV SOLN
INTRAVENOUS | Status: AC
  Administered 2018-12-04: 18:00:00 via INTRAVENOUS
  Filled 2018-12-04: qty 1200

## 2018-12-04 MED ORDER — ORAL CARE MOUTH RINSE
15.0000 mL | OROMUCOSAL | Status: DC
  Administered 2018-12-04 – 2018-12-05 (×10): 15 mL via OROMUCOSAL

## 2018-12-04 MED ORDER — ETOMIDATE 2 MG/ML IV SOLN
INTRAVENOUS | Status: DC | PRN
  Administered 2018-12-04: 8 mg via INTRAVENOUS

## 2018-12-04 MED ORDER — SUCCINYLCHOLINE CHLORIDE 20 MG/ML IJ SOLN
INTRAMUSCULAR | Status: DC | PRN
  Administered 2018-12-04: 100 mg via INTRAVENOUS

## 2018-12-04 MED ORDER — FENTANYL CITRATE (PF) 100 MCG/2ML IJ SOLN
25.0000 ug | INTRAMUSCULAR | Status: DC | PRN
  Administered 2018-12-04 – 2018-12-06 (×4): 25 ug via INTRAVENOUS
  Filled 2018-12-04 (×4): qty 2

## 2018-12-04 NOTE — Progress Notes (Signed)
2145 Pt De Sated, placed on N/C 4 L at this time. Maintaining Sat's at 93-96% continue with Bair Hugger for Temp of 96.4 ax.   2332 Patient's B/P Soft 79/37 MAP of 49 Levo per Dr. Kayleen Memos. Also noted that patient with change in LOC, not answering questions;  does respond to sternal rub at this time.  0145  Patient not responding to painful stimuli, noted no gag reflex when performing oral care. Notified E link nurse, awaiting Dr. Oletta Darter return call.  0215 Orders for ABG.  0255 Spoke with patients family member ( brother) Carmen Stone at # [phone] regarding patients change in condition and that the MD recommends re-intubation at this time. He states that we need to do what ever it takes.   0500 Update given to patients family member Carmen Stone; discussed plan of care with re-intubation, Head CT, and CXR. Again patient states that he wants everything done for Carmen Stone. Bair hugger off at this time, continue with CRRT.    0730 To Radiology for CT scan of head.CRRT on hold at this time.

## 2018-12-04 NOTE — Progress Notes (Signed)
[place] NOTE   Pharmacy Consult for TPN Indication: prolonged ileus  Patient Measurements: Height: 5\' 4"  (162.6 cm) Weight: 163 lb 9.3 oz (74.2 kg) IBW/kg (Calculated) : 54.7 TPN AdjBW (KG): 59.7 Body mass index is 28.08 kg/m. Usual Weight: 90kg  Insulin Requirements: 33 units in past 24 hours  Current Nutrition: NPO  IVF: NS @ 24ml/hr  Central access: 12/09/2018 TPN start date: 11/30/18  ASSESSMENT                                                                                                          HPI: She presented 6/7 with perforated small bowel from closed-loop obstruction and underwent emergent exploratory laparotomy.  She returned to OR on 6/9 and again on 6/14 for closure of abdominal wall. She coded on 2022-12-06 and developed multiorgan failure.  She remains intubated & on CRRT.  Pressors have been weaned off and she is completing antibiotic course for sepsis/PNA.   She has been NPO since admission on 6/7.  Pharmacy consulted to start TPN.  Acute liver injury is improving but will initiate TPN at low rate & advance slowly as requested by surgery.    Significant events:  6/18: OK to increase to goal rate per surgery  Today:   Glucose - Hx DM on 70/30 Novolog 20 units BID PTA.  CBGs improved but remain above goal with increase to 40 units insulin in TPN + ICU resistant scale (range 155-190).  Electrolytes - WNL.    Renal - AKI on CRRT.  257ml NG output on 6/19.  LFTs - Acute liver injury- AST/ALT elevated but trending down (41/127).  TBili 1.5 (on 6/18)  TGs - 85 (6/9)  Prealbumin - <5 (6/17)  NUTRITIONAL GOALS                                                                                             RD recs:  Kcal:  1530 kcal, Protein:  172-187 grams (2.3-2.5 grams/kg), Fluid:  >/= 1.8 L/day  Custom TPN at goal rate of 75 ml/hr (NO LIPIDS x7 days) to provide: 180 g/day protein, 1515 Kcal/day.  PLAN  At 1800 today:  Custom high protein, hypocaloric TPN per recommendations for obese, critically ill patient.  Continue TPN at 75 ml/hr.  Electrolytes in TPN as follows:  Na 31mEq/L, Phos 20Mmol/L, Ca 56mEq/L, NO K or Mag.  Max acetate.  Hold 20% lipid emulsion for first 7 days for ICU patients per ASPEN guidelines (Start date 6/23)  TPN to contain standard multivitamins.  Add trace elements on MWF only due to national backorder.  MIVF per MD- currently none  Continue resistant ICU glycemic control orders q4h.   Increase to 55 units of insulin in TPN  TPN lab panels Mondays & Thursdays.    F/u daily.  Noted plan for tube feeding trial once NG output improves.   Solace, Manwarren 12/04/2018,8:08 AM

## 2018-12-04 NOTE — Progress Notes (Signed)
6 Days Post-Op  Subjective: CC:  Re-intubated overnight due to MS changes and resp acidosis, CT head pending  Objective: Vital signs in last 24 hours: Temp:  [94.2 F (34.6 C)-98.6 F (37 C)] 96.6 F (35.9 C) (06/20 0725) Pulse Rate:  [74-104] 90 (06/20 0600) Resp:  [18-36] 33 (06/20 0645) BP: (80-129)/(25-61) 99/42 (06/20 0715) SpO2:  [92 %-100 %] 100 % (06/20 0807) FiO2 (%):  [40 %-60 %] 60 % (06/20 0807) Last BM Date: 11/24/18  Intake/Output from previous day: 06/19 0701 - 06/20 0700 In: 2079.7 [I.V.:2079.7] Out: 868 [Urine:33; Emesis/NG output:225] Intake/Output this shift: No intake/output data recorded.  PE: Gen: critically ill on vent    Central line in RUC, HD cath in right neck.  Abd: soft, non-distended Ext: SCD in place.  Lab Results:  Recent Labs    12/03/18 0325 12/04/18 0500  WBC 27.4* 31.7*  HGB 8.0* 7.8*  HCT 24.2* 24.2*  PLT 66* 76*   BMET Recent Labs    12/03/18 1613 12/04/18 0500  NA 135 136  K 3.7 4.2  CL 102 103  CO2 25 25  GLUCOSE 185* 190*  BUN 39* 40*  CREATININE 0.99 1.02*  CALCIUM 7.3* 7.6*   PT/INR No results for input(s): LABPROT, INR in the last 72 hours. CMP     Component Value Date/Time   NA 136 12/04/2018 0500   NA 138 06/01/2017 0859   K 4.2 12/04/2018 0500   K 4.2 06/01/2017 0859   CL 103 12/04/2018 0500   CO2 25 12/04/2018 0500   CO2 24 06/01/2017 0859   GLUCOSE 190 (H) 12/04/2018 0500   GLUCOSE 118 06/01/2017 0859   BUN 40 (H) 12/04/2018 0500   BUN 15.2 06/01/2017 0859   CREATININE 1.02 (H) 12/04/2018 0500   CREATININE 0.79 06/02/2018 0856   CREATININE 0.7 06/01/2017 0859   CALCIUM 7.6 (L) 12/04/2018 0500   CALCIUM 9.0 06/01/2017 0859   PROT 4.8 (L) 12/03/2018 0325   PROT 7.0 06/01/2017 0859   ALBUMIN 1.5 (L) 12/04/2018 0500   ALBUMIN 3.7 06/01/2017 0859   AST 37 12/03/2018 0325   AST 13 (L) 06/02/2018 0856   AST 14 06/01/2017 0859   ALT 89 (H) 12/03/2018 0325   ALT 16 06/02/2018 0856   ALT  12 06/01/2017 0859   ALKPHOS 371 (H) 12/03/2018 0325   ALKPHOS 97 06/01/2017 0859   BILITOT 1.1 12/03/2018 0325   BILITOT 0.3 06/02/2018 0856   BILITOT 0.22 06/01/2017 0859   GFRNONAA 58 (L) 12/04/2018 0500   GFRNONAA >60 06/02/2018 0856   GFRAA >60 12/04/2018 0500   GFRAA >60 06/02/2018 0856   Lipase     Component Value Date/Time   LIPASE 24 12/04/2018 0355       Studies/Results: Dg Chest Port 1 View  Result Date: 12/04/2018 CLINICAL DATA:  Intubated EXAM: PORTABLE CHEST 1 VIEW COMPARISON:  12/02/2018 chest radiograph. FINDINGS: Endotracheal tube tip is 3.2 cm above the carina. Enteric tube enters stomach with the tip not seen on this image. Right internal jugular central venous catheter terminates in the lower third of the SVC. Right subclavian central venous catheter terminates at the cavoatrial junction. Left subclavian Port-A-Cath terminates in the lower third of the SVC. Stable cardiomediastinal silhouette with normal heart size. No pneumothorax. Small right and small to moderate left pleural effusions, increased on the left. Stable mild right basilar atelectasis. Patchy left lung base opacity, increased. IMPRESSION: 1. Well-positioned support structures.  No pneumothorax. 2. Small to  moderate left pleural effusion, increased, with increased patchy left lung base opacity. 3. Stable small right pleural effusion with mild right basilar atelectasis. Electronically Signed   By: Ilona Sorrel M.D.   On: 12/04/2018 04:40    Anti-infectives: Anti-infectives (From admission, onward)   Start     Dose/Rate Route Frequency Ordered Stop   12/12/2018 1800  fluconazole (DIFLUCAN) IVPB 200 mg  Status:  Discontinued     200 mg 100 mL/hr over 60 Minutes Intravenous Every 24 hours 12/12/2018 0748 11/22/2018 0847   11/22/2018 1800  fluconazole (DIFLUCAN) IVPB 400 mg  Status:  Discontinued     400 mg 100 mL/hr over 120 Minutes Intravenous Every 24 hours 12/12/2018 0847 12/05/2018 0856   11/20/2018 1400   piperacillin-tazobactam (ZOSYN) IVPB 2.25 g  Status:  Discontinued     2.25 g 100 mL/hr over 30 Minutes Intravenous Every 6 hours 12/07/2018 0748 12/01/18 1104   11/19/2018 0600  cefoTEtan (CEFOTAN) 2 g in sodium chloride 0.9 % 100 mL IVPB     2 g 200 mL/hr over 30 Minutes Intravenous On call to O.R. 11/22/18 1300 11/20/2018 0624   11/22/18 1800  fluconazole (DIFLUCAN) IVPB 400 mg  Status:  Discontinued     400 mg 100 mL/hr over 120 Minutes Intravenous Every 24 hours 11/26/2018 1528 11/22/2018 0748   11/22/18 1800  vancomycin (VANCOCIN) 1,500 mg in sodium chloride 0.9 % 500 mL IVPB  Status:  Discontinued     1,500 mg 250 mL/hr over 120 Minutes Intravenous Every 24 hours 11/22/18 0358 11/22/18 0750   11/22/18 0115  vancomycin (VANCOCIN) 1,500 mg in sodium chloride 0.9 % 500 mL IVPB     1,500 mg 250 mL/hr over 120 Minutes Intravenous  Once 11/22/18 0101 11/22/18 0319   12/09/2018 1630  fluconazole (DIFLUCAN) IVPB 800 mg     800 mg 100 mL/hr over 240 Minutes Intravenous  Once 12/07/2018 1527 12/14/2018 2013   11/22/2018 1600  piperacillin-tazobactam (ZOSYN) IVPB 3.375 g  Status:  Discontinued     3.375 g 12.5 mL/hr over 240 Minutes Intravenous Every 8 hours 12/06/2018 1522 12/05/2018 0748   12/06/2018 0545  piperacillin-tazobactam (ZOSYN) IVPB 3.375 g     3.375 g 100 mL/hr over 30 Minutes Intravenous  Once 11/30/2018 0530 12/04/2018 0626   11/16/2018 0530  piperacillin-tazobactam (ZOSYN) IVPB 4.5 g  Status:  Discontinued     4.5 g 200 mL/hr over 30 Minutes Intravenous  Once 12/08/2018 0517 12/02/2018 0529       Assessment/Plan HTN HLD DM Hypothyroidism 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 Ninfa Linden - POD #13 2. S/pReexploration of [facility] lapar[facility] Dr. Rosendo Gros - POD #11 3. S/pExploratory laparotomy with washout of abdomen and placement of vacuum VAC dressing6/11 Dr. Brantley Stage - POD #9 4. Exploratory laparotomy with anastomosis of SB and closure of abdominal wall 6/14 Dr. Brantley Stage - POD #6 - Cont NGT.   - Cont TNA today. Continue to trend CMET and monitor liver function - Cont BID NSWD dressing changes to midline wound - WBC increasing.Purulent aspirate noted from bronchus today.  Will get CT C/A/P to assess for source of infection.  ID -diflucan 6/7-6/11, vancomycin 6/8-6/8. zosyn 6/7>> (day 13)  FEN -IVF,  NPO/NGT. TNA  VTE -SCDs, heparin Foley -in place Contact - brother GeorgeMattier([phone] or [phone])      - will discuss plan of care today  LOS: 13 days   Rosario Adie, MD  [facility]

## 2018-12-04 NOTE — Progress Notes (Signed)
Spoke to patient's brother and updated him on her condition.  He states that he would like full resuscitation in the event of arrest and would like to continue with aggressive treatment for now.  Informed him that we will get a CT scan of her chest, abd and pelvis to investigate the source of her rising wbc.  Rosario Adie, MD  [facility]

## 2018-12-04 NOTE — Progress Notes (Signed)
Carmen Stone Progress Note    Assessment/ Plan:     1. Acute renal failure: last known baseline 0.79 05/2018.  She has acute oliguric AKI in the setting of septic shock due to perf bowel likely 2/2/ ATN. SP CRRT 6/9 - 6/12, held after PEA arrest.  Resumed 6/14 after abdominal closure.  No heparin.  Keep even in the setting of clinical decompensation overnight.    2. Septic shock: s/p courses of Zosyn and fluconazole, now with clinical decompensation overnight.  Back on norepinephrine. WBC 31.7 this AM.  CT C/A/P pending.  Will order blood cultures.  Antibiotics, if necessary, per primary teams  3. Acute hypoxic respiratory failure: reintubated overnight, CT head negative  4. Perforated small bowel due to closed-loop SBO / septic shock- per CCM/ gen surg, s/p closure 6/14. NGT in place  5. DM: per primary  6. Takatsubo's cardiomyopathy: EF ~ 20%  7. Severe hypoalbuminemia: per primary-- on TPN  8. SP PEA arrest: on 6/13  9. Dispo: remains in ICU  Subjective:    Overnight, had altered level of consciousness and was re-intubated.  Head CT noted no acute change but motion artifact.  WBC ct is 31.7 this AM.  Back on pressors. For Abd CT.  Abdominal xray taken and pending.   Objective:   BP (!) 120/58   Pulse (!) 108   Temp (!) 97.5 F (36.4 C) (Axillary)   Resp (!) 31   Ht '5\' 4"'  (1.626 m)   Wt 74.2 kg   SpO2 100%   BMI 28.08 kg/m   Intake/Output Summary (Last 24 hours) at 12/04/2018 1254 Last data filed at 12/04/2018 1200 Gross per 24 hour  Intake 2112.56 ml  Output 752 ml  Net 1360.56 ml   Weight change:   Physical Exam: Genintubated, sedated NECK: no JVD  PULM: mechanical breath sounds bilaterally CV: RRR no MRG ABD: soft, abd wall edema, dressings c/d/i, quiet bowel sounds GUfoley cath minimal urine,  Extanasarca about the same Neuro: intubated, sedated  Imaging: Ct Head Wo Contrast  Result Date: 12/04/2018 CLINICAL DATA:  Altered mental  status EXAM: CT HEAD WITHOUT CONTRAST TECHNIQUE: Contiguous axial images were obtained from the base of the skull through the vertex without intravenous contrast. COMPARISON:  CT head 11/26/2017 FINDINGS: Brain: Image quality degraded by motion. Images repeated due to motion Ventricle size and cerebral volume normal. No acute infarct. Negative for hemorrhage or mass. Mild white matter changes appear chronic. Vascular: Negative for hyperdense vessel Skull: Negative Sinuses/Orbits: Paranasal sinuses clear. NG tube in place. Negative orbit Other: None IMPRESSION: No acute abnormality.  Image quality degraded by motion. Electronically Signed   By: Franchot Gallo M.D.   On: 12/04/2018 08:25   Dg Chest Port 1 View  Result Date: 12/04/2018 CLINICAL DATA:  Intubated EXAM: PORTABLE CHEST 1 VIEW COMPARISON:  12/02/2018 chest radiograph. FINDINGS: Endotracheal tube tip is 3.2 cm above the carina. Enteric tube enters stomach with the tip not seen on this image. Right internal jugular central venous catheter terminates in the lower third of the SVC. Right subclavian central venous catheter terminates at the cavoatrial junction. Left subclavian Port-A-Cath terminates in the lower third of the SVC. Stable cardiomediastinal silhouette with normal heart size. No pneumothorax. Small right and small to moderate left pleural effusions, increased on the left. Stable mild right basilar atelectasis. Patchy left lung base opacity, increased. IMPRESSION: 1. Well-positioned support structures.  No pneumothorax. 2. Small to moderate left pleural effusion, increased, with increased patchy  left lung base opacity. 3. Stable small right pleural effusion with mild right basilar atelectasis. Electronically Signed   By: Ilona Sorrel M.D.   On: 12/04/2018 04:40    Labs: BMET Recent Labs  Lab 12/01/18 0222 12/01/18 2049 12/02/18 0307 12/02/18 1606 12/03/18 0325 12/03/18 1613 12/04/18 0500  NA 138  138 136 137 135 135  135 135 136   K 4.2  4.2 3.9 3.9 4.2 3.8  3.8 3.7 4.2  CL 102  102 102 102 101 103  103 102 103  CO2 '25  25 24 22 24 24  24 25 25  ' GLUCOSE 201*  204* 238* 192* 220* 254*  256* 185* 190*  BUN 29*  29* 33* 34* 32* 41*  40* 39* 40*  CREATININE 1.19*  1.27* 1.27* 1.29* 1.12* 1.24*  1.20* 0.99 1.02*  CALCIUM 7.3*  7.2* 7.1* 7.4* 7.3* 7.0*  7.0* 7.3* 7.6*  PHOS 2.4*  2.4* 1.8* 1.8* 3.3 2.9  2.9 2.6 3.2   CBC Recent Labs  Lab 11/30/18 0321 12/01/18 0222 12/03/18 0325 12/04/18 0500  WBC 23.4* 23.5* 27.4* 31.7*  NEUTROABS  --  21.8*  --   --   HGB 9.0* 9.9* 8.0* 7.8*  HCT 26.5* 29.6* 24.2* 24.2*  MCV 89.2 89.2 91.0 93.1  PLT 49* 67* 66* 76*    Medications:    . chlorhexidine  15 mL Mouth Rinse BID  . chlorhexidine gluconate (MEDLINE KIT)  15 mL Mouth Rinse BID  . Chlorhexidine Gluconate Cloth  6 each Topical Daily  . dorzolamide-timolol  1 drop Both Eyes BID  . heparin injection (subcutaneous)  5,000 Units Subcutaneous Q8H  . insulin aspart  3-9 Units Subcutaneous Q4H  . mouth rinse  15 mL Mouth Rinse q12n4p  . mouth rinse  15 mL Mouth Rinse 10 times per day  . pantoprazole (PROTONIX) IV  40 mg Intravenous Q24H      Carmen Lips, MD [place] pgr [phone] 12/04/2018, 12:54 PM

## 2018-12-04 NOTE — Progress Notes (Signed)
[place] Progress Note Patient Name: ALITZEL COOKSON DOB: 1955-10-08 MRN: [id number]   Date of Service  12/04/2018  HPI/Events of Note  ALOC - Blood glucose = 160's. ABG 7.27/57.6/97.4  eICU Interventions  Will order: 1. Intubate patient.  2. Head CT Scan without contrast post intubation.      Intervention Category Major Interventions: Change in mental status - evaluation and management;Other:  Esteven Overfelt Cornelia Copa 12/04/2018, 2:46 AM

## 2018-12-04 NOTE — Progress Notes (Signed)
NAME:  Carmen Stone, MRN:  [id number], DOB:  Aug 11, 1955, LOS: 6 ADMISSION DATE:  11/17/2018, CONSULTATION DATE:  11/21/2018 REFERRING MD:  Dr. Ninfa Linden, Surgery, CHIEF COMPLAINT:  Abdominal pain   Brief History   63 yo female presented to ER with nausea, vomiting, abdominal pain.  CT abd/pelvis showed focal perforated loop of SB with free air with multiple complex ventral hernias.  Had emergent laparotomy with SB resection, lysis of adhesions and wound vac.  Remained on vent and pressors post op.  Cardiorespiratory arrest 12/11/2018-lasted a couple of minutes Extubated 12/03/2018 Reintubated 12/04/2018 for altered mentation, was not able to protect her airway-head CT negative  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, HTN, HLD, DM type II  Significant Hospital Events   6/07 Admit, to OR 6/08 transfuse FFP, on multiple pressors 6/09 To OR for wash out, start CRRT 6/10: Diflucan discontinued due to rising liver function tests 6/12-still on CRRT, no urine output 6/13-acute respiratory arrest overnight, off CRRT, on pressors, limit sedation 6/14 bowel anastomosis and closure of abdominal wall 6/15: Off pressors.  Changing volume goal on CRRT to -100 cc an hour.  Starting to wean.  Fentanyl infusion stopped changed to PRN.  7 hours weaning 6/16 remains encephalopathic.  Requiring intermittent fentanyl.  Tolerating clears to 200 cc negative fluid balance hourly.  She is hypertensive, adding labetalol.  IV TNA started 6/17: A little more awake.  Tolerating pressure support but SBI exceeds 105 on spontaneous breathing trial.  Continuing aggressive volume removal with CRRT, volume status now down to just around 2 L positive.  Decreasing PRN sedation further 6/18: Neuro status continues to improve.  Hemodynamics stable however did require brief vasoactive drips during nighttime.  She is now at an net negative fluid volume status.  Likely nearing euvolemic state.   Tolerating spontaneous breathing trial 6/19 Extubated 6/20 reintubated for altered mentation, head CT negative Consults:  Renal 6/09 AKI, acidosis Cardiology 6/09 Takotsubo CM   Procedures:  ETT 6/07 >> 6/19 Rt IJ HD 6/09 >> Rt Switzer CVL 6/09 >> Lt radial aline 6/09 >>  Significant Diagnostic Tests:  CT abd/pelvis 6/07 >> focal perforated loop of SB with free air with multiple complex ventral hernias Echo 6/08 >> EF 20%, Takotsubo CM CT head 6/20-negative for any acute findings  Micro Data:  COVID 6/07 >> negative  Antimicrobials:  Zosyn 6/07 >> 6/17 Diflucan 6/07 -6/10  Interim history/subjective:  Remains on vent-reintubated early this morning On PRVC  Objective   Blood pressure (!) 99/42, pulse 90, temperature (!) 96.6 F (35.9 C), temperature source Axillary, resp. rate (!) 33, height 5\' 4"  (1.626 m), weight 74.2 kg, SpO2 100 %. CVP:  [1 mmHg-8 mmHg] 8 mmHg  Vent Mode: PRVC FiO2 (%):  [40 %-60 %] 60 % Set Rate:  [18 bmp] 18 bmp Vt Set:  [430 mL] 430 mL PEEP:  [5 cmH20-8 cmH20] 8 cmH20 Plateau Pressure:  [26 cmH20-28 cmH20] 28 cmH20   Intake/Output Summary (Last 24 hours) at 12/04/2018 0852 Last data filed at 12/04/2018 0700 Gross per 24 hour  Intake 1994.73 ml  Output 798 ml  Net 1196.73 ml   Filed Weights   12/01/18 0305 12/02/18 0407 12/03/18 0500  Weight: 82.5 kg 76 kg 74.2 kg    Examination: Middle-age lady, unresponsive Pupils reactive Endotracheal tube in place S1-S2 appreciated Abdomen is soft, bowel sounds appreciated Extremities shows no clubbing no edema Skin is warm and dry  Chest x-ray reviewed  by myself showing pleural effusion Head CT results reviewed  Resolved Hospital Problem list   Metabolic alkalosis Abdominal peritonitis with septic shock Circulatory shock, mix of cardiogenic and sepsis off pressors as of 6/15 Cardiorespiratory arrest 6/13 Shock liver Acute metabolic encephalopathy Hypophosphatemia  Assessment & Plan:    Acute change in mental status -This led to reintubation -Head CT negative -She did have a similar event about a week ago with a negative CT head, stabilized after about 24 hours -Labs are remarkable  Status post small bowel resection, lysis of adhesions Reexploration 6/14, creation of functional end-to-end small bowel anastomosis on closure of abdominal wound -Wound care -Continue TPN  Acute hypoxic respiratory failure in setting of peritonitis -Continue ventilator support -Weaning as tolerated after 24 hours  Acute systolic congestive heart failure with ejection fraction of 20%, Tocco Sobel cardiomyopathy -Hold lisinopril, Pravachol -PRN hydralazine for SBP goal less than 180 -Plan is to repeat echo beginning of July  Acute kidney injury from tubular necrosis, baseline creatinine of 0.73 from 11/20/2018 -Defer to renal -On CRRT  Type 2 diabetes -Hypoglycemia 3 protocol  Thrombocytopenia from sepsis -Trend CBC  Altered mentation unexplained by CT scan of the head Unexplained by any metabolic derangements Continue to monitor   Best practice:  Diet: Continue parenteral nutrition DVT prophylaxis: SCDs, subcu heparin GI prophylaxis: Protonix Mobility: Bedrest Code Status: Full code Disposition: ICU  Labs:   CMP Latest Ref Rng & Units 12/04/2018 12/03/2018 12/03/2018  Glucose 70 - 99 mg/dL 190(H) 185(H) 254(H)  BUN 8 - 23 mg/dL 40(H) 39(H) 41(H)  Creatinine 0.44 - 1.00 mg/dL 1.02(H) 0.99 1.24(H)  Sodium 135 - 145 mmol/L 136 135 135  Potassium 3.5 - 5.1 mmol/L 4.2 3.7 3.8  Chloride 98 - 111 mmol/L 103 102 103  CO2 22 - 32 mmol/L 25 25 24   Calcium 8.9 - 10.3 mg/dL 7.6(L) 7.3(L) 7.0(L)  Total Protein 6.5 - 8.1 g/dL - - -  Total Bilirubin 0.3 - 1.2 mg/dL - - -  Alkaline Phos 38 - 126 U/L - - -  AST 15 - 41 U/L - - -  ALT 0 - 44 U/L - - -   CBC Latest Ref Rng & Units 12/04/2018 12/03/2018 12/01/2018  WBC 4.0 - 10.5 K/uL 31.7(H) 27.4(H) 23.5(H)  Hemoglobin 12.0 - 15.0 g/dL  7.8(L) 8.0(L) 9.9(L)  Hematocrit 36.0 - 46.0 % 24.2(L) 24.2(L) 29.6(L)  Platelets 150 - 400 K/uL 76(L) 66(L) 67(L)   ABG    Component Value Date/Time   PHART 7.445 12/04/2018 0435   PCO2ART 37.4 12/04/2018 0435   PO2ART 48.6 (L) 12/04/2018 0435   HCO3 25.3 12/04/2018 0435   TCO2 16 (L) 11/17/2018 1658   ACIDBASEDEF 0.8 12/04/2018 0215   O2SAT 83.6 12/04/2018 0435   CBG (last 3)  Recent Labs    12/04/18 0147 12/04/18 0402 12/04/18 0716  GLUCAP 160* 161* 155*    The patient is critically ill with multiple organ system failure and requires high complexity decision making for assessment and support, frequent evaluation and titration of therapies, advanced monitoring, review of radiographic studies and interpretation of complex data.    Critical Care Time devoted to patient care services, exclusive of separately billable procedures, described in this note is 30 minutes  Sherrilyn Rist, MD [place], PCCM Cell: [phone]

## 2018-12-04 NOTE — Progress Notes (Signed)
Patient vomited a copious amount of the oral contrast shortly after receiving it. No changes in NG tube location had been noted, tube had been flushing well, contrast was given slowly. STAT KUB results pending. Dr. Marcello Moores was notified and instructed RN to either obtain non contrast CT if tube in appropriate position or reposition and re administer contrast if tube is misplaced.

## 2018-12-04 NOTE — Progress Notes (Signed)
eLink Physician-Brief Progress Note Patient Name: Carmen Stone DOB: 1956/06/11 MRN: [id number]   Date of Service  12/04/2018  HPI/Events of Note  ALOC  eICU Interventions  Will get ABG now.      Intervention Category Major Interventions: Change in mental status - evaluation and management  Sommer,Steven Eugene 12/04/2018, 2:15 AM

## 2018-12-04 NOTE — Anesthesia Procedure Notes (Signed)
Procedure Name: Intubation Date/Time: 12/04/2018 3:34 AM Performed by: Lissa Morales, CRNA Pre-anesthesia Checklist: Patient identified, Emergency Drugs available, Suction available and Patient being monitored Patient Re-evaluated:Patient Re-evaluated prior to induction Oxygen Delivery Method: Ambu bag Preoxygenation: Pre-oxygenation with 100% oxygen Induction Type: IV induction and Cricoid Pressure applied Ventilation: Mask ventilation without difficulty Laryngoscope Size: Glidescope and 3 Grade View: Grade I Tube type: Oral Tube size: 7.5 mm Number of attempts: 1 Airway Equipment and Method: Stylet and Oral airway Placement Confirmation: ETT inserted through vocal cords under direct vision,  positive ETCO2 and breath sounds checked- equal and bilateral Secured at: 22 cm Tube secured with: Tape Dental Injury: Teeth and Oropharynx as per pre-operative assessment

## 2018-12-04 NOTE — Progress Notes (Signed)
CRRT paused at approx 1745 for trip to CT. Blood was recirculated at that time. CRRT reinitiated at 3167 without complication.

## 2018-12-04 NOTE — Progress Notes (Signed)
[facility] Progress Note Patient Name: Carmen Stone DOB: 06/02/1956 MRN: [id number]   Date of Service  12/04/2018  HPI/Events of Note  ABG on 40%/PRVC 18/TV 430/P 5 = 7.445/37.4/48.6. Sat on oximeter currently 100%. CXR with bilateral pleural effusion. R atelectasis and ETT 3.2 cm above the carina.   eICU Interventions  Will order: 1. Increase PEEP to 8.     Intervention Category Major Interventions: Respiratory failure - evaluation and management  [name] 12/04/2018, 4:54 AM

## 2018-12-05 LAB — RENAL FUNCTION PANEL
Albumin: 1.4 g/dL — ABNORMAL LOW (ref 3.5–5.0)
Albumin: 2.2 g/dL — ABNORMAL LOW (ref 3.5–5.0)
Anion gap: 8 (ref 5–15)
Anion gap: 11 (ref 5–15)
BUN: 42 mg/dL — ABNORMAL HIGH (ref 8–23)
BUN: 53 mg/dL — ABNORMAL HIGH (ref 8–23)
CO2: 24 mmol/L (ref 22–32)
CO2: 22 mmol/L (ref 22–32)
Calcium: 7.2 mg/dL — ABNORMAL LOW (ref 8.9–10.3)
Calcium: 7.5 mg/dL — ABNORMAL LOW (ref 8.9–10.3)
Chloride: 102 mmol/L (ref 98–111)
Chloride: 98 mmol/L (ref 98–111)
Creatinine, Ser: 0.94 mg/dL (ref 0.44–1.00)
Creatinine, Ser: 1.25 mg/dL — ABNORMAL HIGH (ref 0.44–1.00)
GFR calc Af Amer: >60 mL/min (ref >60)
GFR calc Af Amer: 53 mL/min — ABNORMAL LOW (ref >60)
GFR calc non Af Amer: >60 mL/min (ref >60)
GFR calc non Af Amer: 45 mL/min — ABNORMAL LOW (ref >60)
Glucose, Bld: 127 mg/dL — ABNORMAL HIGH (ref 70–99)
Glucose, Bld: 164 mg/dL — ABNORMAL HIGH (ref 70–99)
Phosphorus: 2.4 mg/dL — ABNORMAL LOW (ref 2.5–4.6)
Phosphorus: 4.4 mg/dL (ref 2.5–4.6)
Potassium: 3.6 mmol/L (ref 3.5–5.1)
Potassium: 4.2 mmol/L (ref 3.5–5.1)
Sodium: 134 mmol/L — ABNORMAL LOW (ref 135–145)
Sodium: 131 mmol/L — ABNORMAL LOW (ref 135–145)

## 2018-12-05 LAB — MAGNESIUM: Magnesium: 2.3 mg/dL (ref 1.7–2.4)

## 2018-12-05 LAB — BLOOD GAS, VENOUS
Acid-Base Excess: 1 mmol/L (ref 0.0–2.0)
Bicarbonate: 23.4 mmol/L (ref 20.0–28.0)
Drawn by: 441261
FIO2: 30
MECHVT: 500 mL
O2 Saturation: 82.4 %
PEEP: 5 cmH2O
Patient temperature: 99.5
RATE: 18 resp/min
pCO2, Ven: 30.2 mmHg — ABNORMAL LOW (ref 44.0–60.0)
pH, Ven: 7.503 — ABNORMAL HIGH (ref 7.250–7.430)
pO2, Ven: 47.8 mmHg — ABNORMAL HIGH (ref 32.0–45.0)

## 2018-12-05 LAB — GLUCOSE, CAPILLARY
Glucose-Capillary: 102 mg/dL — ABNORMAL HIGH (ref 70–99)
Glucose-Capillary: 119 mg/dL — ABNORMAL HIGH (ref 70–99)
Glucose-Capillary: 121 mg/dL — ABNORMAL HIGH (ref 70–99)
Glucose-Capillary: 126 mg/dL — ABNORMAL HIGH (ref 70–99)
Glucose-Capillary: 144 mg/dL — ABNORMAL HIGH (ref 70–99)
Glucose-Capillary: 69 mg/dL — ABNORMAL LOW (ref 70–99)

## 2018-12-05 LAB — CBC
HCT: 20 % — ABNORMAL LOW (ref 36.0–46.0)
Hemoglobin: 6.5 g/dL — CL (ref 12.0–15.0)
MCH: 30 pg (ref 26.0–34.0)
MCHC: 32.5 g/dL (ref 30.0–36.0)
MCV: 92.2 fL (ref 80.0–100.0)
Platelets: 87 10*3/uL — ABNORMAL LOW (ref 150–400)
RBC: 2.17 MIL/uL — ABNORMAL LOW (ref 3.87–5.11)
RDW: 16.7 % — ABNORMAL HIGH (ref 11.5–15.5)
WBC: 25.4 10*3/uL — ABNORMAL HIGH (ref 4.0–10.5)
nRBC: 0.2 % (ref 0.0–0.2)

## 2018-12-05 LAB — HEMOGLOBIN AND HEMATOCRIT, BLOOD
HCT: 24.2 % — ABNORMAL LOW (ref 36.0–46.0)
Hemoglobin: 8 g/dL — ABNORMAL LOW (ref 12.0–15.0)

## 2018-12-05 LAB — PREPARE RBC (CROSSMATCH)

## 2018-12-05 MED ORDER — POTASSIUM PHOSPHATES 15 MMOLE/5ML IV SOLN
20.0000 mmol | Freq: Once | INTRAVENOUS | Status: AC
  Administered 2018-12-05: 12:00:00 20 mmol via INTRAVENOUS
  Filled 2018-12-05: qty 6.67

## 2018-12-05 MED ORDER — ORAL CARE MOUTH RINSE
15.0000 mL | OROMUCOSAL | Status: DC
  Administered 2018-12-05 – 2019-01-09 (×328): 15 mL via OROMUCOSAL

## 2018-12-05 MED ORDER — VANCOMYCIN HCL IN DEXTROSE 750-5 MG/150ML-% IV SOLN
750.0000 mg | INTRAVENOUS | Status: DC
  Administered 2018-12-06: 750 mg via INTRAVENOUS
  Filled 2018-12-05: qty 150

## 2018-12-05 MED ORDER — SODIUM CHLORIDE 0.9% IV SOLUTION
Freq: Once | INTRAVENOUS | Status: AC
  Administered 2018-12-05: 10:00:00 via INTRAVENOUS

## 2018-12-05 MED ORDER — FUROSEMIDE 10 MG/ML IJ SOLN
40.0000 mg | Freq: Once | INTRAMUSCULAR | Status: DC
  Filled 2018-12-05: qty 4

## 2018-12-05 MED ORDER — VANCOMYCIN HCL 10 G IV SOLR
1250.0000 mg | Freq: Once | INTRAVENOUS | Status: AC
  Administered 2018-12-05: 1250 mg via INTRAVENOUS
  Filled 2018-12-05: qty 1250

## 2018-12-05 MED ORDER — DEXTROSE 50 % IV SOLN
INTRAVENOUS | Status: AC
  Filled 2018-12-05: qty 50

## 2018-12-05 MED ORDER — SODIUM CHLORIDE 0.9 % IV SOLN
2.0000 g | Freq: Two times a day (BID) | INTRAVENOUS | Status: DC
  Administered 2018-12-05 – 2018-12-07 (×4): 2 g via INTRAVENOUS
  Filled 2018-12-05 (×4): qty 2

## 2018-12-05 MED ORDER — STERILE WATER FOR INJECTION IV SOLN
INTRAVENOUS | Status: AC
  Administered 2018-12-05: 18:00:00 via INTRAVENOUS
  Filled 2018-12-05: qty 1200

## 2018-12-05 MED ORDER — DEXTROSE 50 % IV SOLN
25.0000 mL | Freq: Once | INTRAVENOUS | Status: AC
  Administered 2018-12-05: 25 mL via INTRAVENOUS

## 2018-12-05 MED ORDER — CHLORHEXIDINE GLUCONATE 0.12% ORAL RINSE (MEDLINE KIT)
15.0000 mL | Freq: Two times a day (BID) | OROMUCOSAL | Status: DC
  Administered 2018-12-05 – 2019-01-09 (×65): 15 mL via OROMUCOSAL

## 2018-12-05 MED ORDER — LORAZEPAM 2 MG/ML IJ SOLN
0.5000 mg | Freq: Four times a day (QID) | INTRAMUSCULAR | Status: DC | PRN
  Administered 2018-12-05: 15:00:00 0.5 mg via INTRAVENOUS
  Filled 2018-12-05: qty 1

## 2018-12-05 MED ORDER — ALBUMIN HUMAN 25 % IV SOLN
25.0000 g | Freq: Once | INTRAVENOUS | Status: AC
  Administered 2018-12-05: 15:00:00 25 g via INTRAVENOUS
  Filled 2018-12-05: qty 100

## 2018-12-05 NOTE — Progress Notes (Signed)
CRITICAL VALUE ALERT  Critical Value:   Hgb 6.5  Date & Time Notied:  7:53 AM 12/05/2018  Provider Notified: Dr. Ander Slade  Orders Received/Actions taken: New orders to be placed. Will continue to monitor.

## 2018-12-05 NOTE — Progress Notes (Signed)
7 Days Post-Op  Subjective: CC:  No acute events yesterday.  Weaning vent settings  Objective: Vital signs in last 24 hours: Temp:  [96 F (35.6 C)-97.5 F (36.4 C)] 96.1 F (35.6 C) (06/21 0415) Pulse Rate:  [85-96] 94 (06/21 0728) Resp:  [16-35] 32 (06/21 0728) BP: (70-220)/(16-175) 108/45 (06/21 0630) SpO2:  [100 %] 100 % (06/21 0728) FiO2 (%):  [30 %-60 %] 30 % (06/21 0728) Weight:  [75.9 kg] 75.9 kg (06/21 0500) Last BM Date: 12/03/18  Intake/Output from previous day: 06/20 0701 - 06/21 0700 In: 2081.8 [I.V.:2081.8] Out: 1538 [Urine:17; Emesis/NG output:650] Intake/Output this shift: No intake/output data recorded.  PE: Gen: critically ill on vent, opens eyes to stimuli    Central line in RUC, HD cath in right neck.  Abd: soft, non-distended Ext: SCD in place.  Lab Results:  Recent Labs    12/04/18 0500 12/05/18 0724  WBC 31.7* 25.4*  HGB 7.8* 6.5*  HCT 24.2* 20.0*  PLT 76* 87*   BMET Recent Labs    12/04/18 1601 12/05/18 0513  NA 136 134*  K 3.7 3.6  CL 104 102  CO2 23 24  GLUCOSE 113* 127*  BUN 42* 42*  CREATININE 0.98 0.94  CALCIUM 7.2* 7.2*   PT/INR No results for input(s): LABPROT, INR in the last 72 hours. CMP     Component Value Date/Time   NA 134 (L) 12/05/2018 0513   NA 138 06/01/2017 0859   K 3.6 12/05/2018 0513   K 4.2 06/01/2017 0859   CL 102 12/05/2018 0513   CO2 24 12/05/2018 0513   CO2 24 06/01/2017 0859   GLUCOSE 127 (H) 12/05/2018 0513   GLUCOSE 118 06/01/2017 0859   BUN 42 (H) 12/05/2018 0513   BUN 15.2 06/01/2017 0859   CREATININE 0.94 12/05/2018 0513   CREATININE 0.79 06/02/2018 0856   CREATININE 0.7 06/01/2017 0859   CALCIUM 7.2 (L) 12/05/2018 0513   CALCIUM 9.0 06/01/2017 0859   PROT 4.8 (L) 12/03/2018 0325   PROT 7.0 06/01/2017 0859   ALBUMIN 1.4 (L) 12/05/2018 0513   ALBUMIN 3.7 06/01/2017 0859   AST 37 12/03/2018 0325   AST 13 (L) 06/02/2018 0856   AST 14 06/01/2017 0859   ALT 89 (H) 12/03/2018 0325    ALT 16 06/02/2018 0856   ALT 12 06/01/2017 0859   ALKPHOS 371 (H) 12/03/2018 0325   ALKPHOS 97 06/01/2017 0859   BILITOT 1.1 12/03/2018 0325   BILITOT 0.3 06/02/2018 0856   BILITOT 0.22 06/01/2017 0859   GFRNONAA >60 12/05/2018 0513   GFRNONAA >60 06/02/2018 0856   GFRAA >60 12/05/2018 0513   GFRAA >60 06/02/2018 0856   Lipase     Component Value Date/Time   LIPASE 24 11/16/2018 0355       Studies/Results: Ct Abdomen Pelvis Wo Contrast  Result Date: 12/04/2018 CLINICAL DATA:  63 year old female with sepsis and elevated white count. History of recent small bowel obstruction, small bowel resection and delayed small bowel anastomosis and closure of abdominal wall. History of breast cancer and RIGHT mastectomy. EXAM: CT CHEST, ABDOMEN AND PELVIS WITHOUT CONTRAST TECHNIQUE: Multidetector CT imaging of the chest, abdomen and pelvis was performed following the standard protocol without IV contrast. COMPARISON:  11/20/2018 abdominal and pelvic CT, 07/31/2014 chest, abdominopelvic CT and other studies. FINDINGS: Please note that parenchymal abnormalities may be missed without intravenous contrast. CT CHEST FINDINGS Cardiovascular: Heart size is normal. Mild thoracic aortic atherosclerotic calcifications noted without aneurysm. No pericardial effusion.  A LEFT IJ Port-A-Cath with tip in the LOWER SVC, RIGHT IJ central venous catheter with tip in the LOWER SVC, and RIGHT subclavian central venous catheter with tip in the LOWER SVC noted. Mediastinum/Nodes: An endotracheal tube is identified with tip 3 cm above the carina. An NG tube is noted with tip in the mid stomach. No enlarged lymph nodes or mediastinal mass. The visualized thyroid is unremarkable. Lungs/Pleura: Small bilateral pleural effusions, LEFT-greater-than-RIGHT noted. LEFT LOWER lung atelectasis noted. RIGHT LOWER lung opacity is noted-question consolidation versus atelectasis. Radiation type changes along the anterior RIGHT lung noted.  Musculoskeletal: Fractures of the anterior RIGHT 4th through 7th ribs appear acute to subacute but correlate clinically. No suspicious bony lesions are present. RIGHT mastectomy is identified. CT ABDOMEN PELVIS FINDINGS Hepatobiliary: The liver is unremarkable. The patient is status post cholecystectomy. No definite biliary dilatation. Pancreas: Mildly atrophic without other abnormality Spleen: Unremarkable Adrenals/Urinary Tract: The kidneys, adrenal glands and bladder are unremarkable except for a Foley catheter within the bladder. Stomach/Bowel: Bowel surgical changes noted. There is mild circumferential wall thickening of scattered small bowel loops and colon of uncertain significance but may represent reactive changes or enteritis/colitis. There is no evidence of bowel obstruction or pneumoperitoneum. A small to moderate amount of ascites is noted as well as fluid along small bowel loops. No discrete collection is definitely identified. Tiny residual foci of free air within the low central abdomen (series 2: Image [id number]) is significantly decreased since 11/22/2018 and likely represents a tiny amount of residual free air. Vascular/Lymphatic: No significant vascular findings are present. No enlarged abdominal or pelvic lymph nodes. Reproductive: Status post hysterectomy. No adnexal masses. Other: Diffuse subcutaneous edema is noted. Anterior abdominal wall surgical changes noted. Musculoskeletal: No acute or suspicious bony abnormalities. Degenerative changes within the RIGHT hip and lumbar spine again noted. IMPRESSION: 1. Small bowel surgical changes with mild circumferential wall thickening of scattered small bowel loops and colon, of uncertain significance. These may represent reactive changes versus enteritis/colitis. No evidence of bowel obstruction. 2. Small bilateral pleural effusions with RIGHT LOWER lung consolidation versus atelectasis. Pneumonia is not excluded. Compressive atelectasis/collapse of the  LEFT LOWER lobe. 3. Small to moderate ascites and diffuse subcutaneous edema. 4. Fractures of the anterior RIGHT 4th through 7th ribs, of uncertain chronicity but appear acute to subacute. Correlate clinically. 5. Electronically Signed   By: Margarette Canada M.D.   On: 12/04/2018 19:04   Dg Abd 1 View  Result Date: 12/04/2018 CLINICAL DATA:  NG tube placement. EXAM: ABDOMEN - 1 VIEW COMPARISON:  Earlier film, same date. FINDINGS: The tip of the NG tube is in the body region of the stomach. The proximal port is in the fundal region below the GE junction. IMPRESSION: NG tube in the stomach in good position without complicating features. Electronically Signed   By: Marijo Sanes M.D.   On: 12/04/2018 14:23   Dg Abd 1 View  Result Date: 12/04/2018 CLINICAL DATA:  NG tube placement EXAM: ABDOMEN - 1 VIEW COMPARISON:  None. FINDINGS: NG tube is identified with tip overlying the proximal stomach and side hole overlying the EG junction. Recommend advancement. IMPRESSION: NG tube with tip overlying the proximal stomach and side hole overlying the EG junction. Recommend advancement. Electronically Signed   By: Margarette Canada M.D.   On: 12/04/2018 13:17   Ct Head Wo Contrast  Result Date: 12/04/2018 CLINICAL DATA:  Altered mental status EXAM: CT HEAD WITHOUT CONTRAST TECHNIQUE: Contiguous axial images were obtained from the  base of the skull through the vertex without intravenous contrast. COMPARISON:  CT head 11/26/2017 FINDINGS: Brain: Image quality degraded by motion. Images repeated due to motion Ventricle size and cerebral volume normal. No acute infarct. Negative for hemorrhage or mass. Mild white matter changes appear chronic. Vascular: Negative for hyperdense vessel Skull: Negative Sinuses/Orbits: Paranasal sinuses clear. NG tube in place. Negative orbit Other: None IMPRESSION: No acute abnormality.  Image quality degraded by motion. Electronically Signed   By: Franchot Gallo M.D.   On: 12/04/2018 08:25   Ct  Chest Wo Contrast  Result Date: 12/04/2018 CLINICAL DATA:  63 year old female with sepsis and elevated white count. History of recent small bowel obstruction, small bowel resection and delayed small bowel anastomosis and closure of abdominal wall. History of breast cancer and RIGHT mastectomy. EXAM: CT CHEST, ABDOMEN AND PELVIS WITHOUT CONTRAST TECHNIQUE: Multidetector CT imaging of the chest, abdomen and pelvis was performed following the standard protocol without IV contrast. COMPARISON:  12/03/2018 abdominal and pelvic CT, 07/31/2014 chest, abdominopelvic CT and other studies. FINDINGS: Please note that parenchymal abnormalities may be missed without intravenous contrast. CT CHEST FINDINGS Cardiovascular: Heart size is normal. Mild thoracic aortic atherosclerotic calcifications noted without aneurysm. No pericardial effusion. A LEFT IJ Port-A-Cath with tip in the LOWER SVC, RIGHT IJ central venous catheter with tip in the LOWER SVC, and RIGHT subclavian central venous catheter with tip in the LOWER SVC noted. Mediastinum/Nodes: An endotracheal tube is identified with tip 3 cm above the carina. An NG tube is noted with tip in the mid stomach. No enlarged lymph nodes or mediastinal mass. The visualized thyroid is unremarkable. Lungs/Pleura: Small bilateral pleural effusions, LEFT-greater-than-RIGHT noted. LEFT LOWER lung atelectasis noted. RIGHT LOWER lung opacity is noted-question consolidation versus atelectasis. Radiation type changes along the anterior RIGHT lung noted. Musculoskeletal: Fractures of the anterior RIGHT 4th through 7th ribs appear acute to subacute but correlate clinically. No suspicious bony lesions are present. RIGHT mastectomy is identified. CT ABDOMEN PELVIS FINDINGS Hepatobiliary: The liver is unremarkable. The patient is status post cholecystectomy. No definite biliary dilatation. Pancreas: Mildly atrophic without other abnormality Spleen: Unremarkable Adrenals/Urinary Tract: The kidneys,  adrenal glands and bladder are unremarkable except for a Foley catheter within the bladder. Stomach/Bowel: Bowel surgical changes noted. There is mild circumferential wall thickening of scattered small bowel loops and colon of uncertain significance but may represent reactive changes or enteritis/colitis. There is no evidence of bowel obstruction or pneumoperitoneum. A small to moderate amount of ascites is noted as well as fluid along small bowel loops. No discrete collection is definitely identified. Tiny residual foci of free air within the low central abdomen (series 2: Image [id number]) is significantly decreased since 11/20/2018 and likely represents a tiny amount of residual free air. Vascular/Lymphatic: No significant vascular findings are present. No enlarged abdominal or pelvic lymph nodes. Reproductive: Status post hysterectomy. No adnexal masses. Other: Diffuse subcutaneous edema is noted. Anterior abdominal wall surgical changes noted. Musculoskeletal: No acute or suspicious bony abnormalities. Degenerative changes within the RIGHT hip and lumbar spine again noted. IMPRESSION: 1. Small bowel surgical changes with mild circumferential wall thickening of scattered small bowel loops and colon, of uncertain significance. These may represent reactive changes versus enteritis/colitis. No evidence of bowel obstruction. 2. Small bilateral pleural effusions with RIGHT LOWER lung consolidation versus atelectasis. Pneumonia is not excluded. Compressive atelectasis/collapse of the LEFT LOWER lobe. 3. Small to moderate ascites and diffuse subcutaneous edema. 4. Fractures of the anterior RIGHT 4th through 7th ribs, of  uncertain chronicity but appear acute to subacute. Correlate clinically. 5. Electronically Signed   By: Margarette Canada M.D.   On: 12/04/2018 19:04   Dg Chest Port 1 View  Result Date: 12/04/2018 CLINICAL DATA:  Intubated EXAM: PORTABLE CHEST 1 VIEW COMPARISON:  12/02/2018 chest radiograph. FINDINGS:  Endotracheal tube tip is 3.2 cm above the carina. Enteric tube enters stomach with the tip not seen on this image. Right internal jugular central venous catheter terminates in the lower third of the SVC. Right subclavian central venous catheter terminates at the cavoatrial junction. Left subclavian Port-A-Cath terminates in the lower third of the SVC. Stable cardiomediastinal silhouette with normal heart size. No pneumothorax. Small right and small to moderate left pleural effusions, increased on the left. Stable mild right basilar atelectasis. Patchy left lung base opacity, increased. IMPRESSION: 1. Well-positioned support structures.  No pneumothorax. 2. Small to moderate left pleural effusion, increased, with increased patchy left lung base opacity. 3. Stable small right pleural effusion with mild right basilar atelectasis. Electronically Signed   By: Ilona Sorrel M.D.   On: 12/04/2018 04:40    Anti-infectives: Anti-infectives (From admission, onward)   Start     Dose/Rate Route Frequency Ordered Stop   12/09/2018 1800  fluconazole (DIFLUCAN) IVPB 200 mg  Status:  Discontinued     200 mg 100 mL/hr over 60 Minutes Intravenous Every 24 hours 12/09/2018 0748 12/12/2018 0847   11/20/2018 1800  fluconazole (DIFLUCAN) IVPB 400 mg  Status:  Discontinued     400 mg 100 mL/hr over 120 Minutes Intravenous Every 24 hours 11/19/2018 0847 12/06/2018 0856   11/20/2018 1400  piperacillin-tazobactam (ZOSYN) IVPB 2.25 g  Status:  Discontinued     2.25 g 100 mL/hr over 30 Minutes Intravenous Every 6 hours 11/29/2018 0748 12/01/18 1104   12/02/2018 0600  cefoTEtan (CEFOTAN) 2 g in sodium chloride 0.9 % 100 mL IVPB     2 g 200 mL/hr over 30 Minutes Intravenous On call to O.R. 11/22/18 1300 12/02/2018 0624   11/22/18 1800  fluconazole (DIFLUCAN) IVPB 400 mg  Status:  Discontinued     400 mg 100 mL/hr over 120 Minutes Intravenous Every 24 hours 12/03/2018 1528 12/11/2018 0748   11/22/18 1800  vancomycin (VANCOCIN) 1,500 mg in sodium  chloride 0.9 % 500 mL IVPB  Status:  Discontinued     1,500 mg 250 mL/hr over 120 Minutes Intravenous Every 24 hours 11/22/18 0358 11/22/18 0750   11/22/18 0115  vancomycin (VANCOCIN) 1,500 mg in sodium chloride 0.9 % 500 mL IVPB     1,500 mg 250 mL/hr over 120 Minutes Intravenous  Once 11/22/18 0101 11/22/18 0319   12/05/2018 1630  fluconazole (DIFLUCAN) IVPB 800 mg     800 mg 100 mL/hr over 240 Minutes Intravenous  Once 11/26/2018 1527 11/16/2018 2013   12/09/2018 1600  piperacillin-tazobactam (ZOSYN) IVPB 3.375 g  Status:  Discontinued     3.375 g 12.5 mL/hr over 240 Minutes Intravenous Every 8 hours 12/07/2018 1522 11/30/2018 0748   12/14/2018 0545  piperacillin-tazobactam (ZOSYN) IVPB 3.375 g     3.375 g 100 mL/hr over 30 Minutes Intravenous  Once 11/16/2018 0530 11/20/2018 0626   11/17/2018 0530  piperacillin-tazobactam (ZOSYN) IVPB 4.5 g  Status:  Discontinued     4.5 g 200 mL/hr over 30 Minutes Intravenous  Once 12/03/2018 0517 12/03/2018 0529       Assessment/Plan HTN HLD DM Hypothyroidism 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 Ninfa Linden - POD #14 2. S/pReexploration of recent laparotomyWITH [facility] OUT (N/A)6/9 Dr. Rosendo Gros - POD #12 3. S/pExploratory laparotomy with washout of abdomen and placement of vacuum VAC dressing6/11 Dr. Brantley Stage - POD #10 4. Exploratory laparotomy with anastomosis of SB and closure of abdominal wall 6/14 Dr. Brantley Stage - POD #7 - Cont NGT.  Pt will most likely have a prolong ileus due to open abd - Cont TNA. Continue to trend CMET and monitor liver  function - Cont BID NSWD dressing changes to midline wound - WBC down today. CT C/A/P show no major signs of infection. Does have some broken ribs ID -diflucan 6/7-6/11, vancomycin 6/8-6/8. zosyn 6/7>> (day 14)  FEN -IVF, NPO/NGT. TNA  VTE -SCDs, heparin Foley -in place Contact - brother GeorgeMattier([phone] or [phone])      - pt is full code  LOS: 14 days   Rosario Adie, MD  [facility]

## 2018-12-05 NOTE — Progress Notes (Signed)
Was hypothermic earlier, was placed on a warmer which was discontinued after she normalized Temperature continue to rise  Tachypnea, general discomfort Requiring sedation, restarted on pressors  CT with atelectasis with concern for infection especially with a prolonged illness  Empirically started on cefepime and vancomycin  Blood cultures, fungal blood cultures, culture tracheal aspirate

## 2018-12-05 NOTE — Progress Notes (Signed)
[facility] NOTE   Pharmacy Consult for TPN Indication: prolonged ileus  Patient Measurements: Height: 5\' 4"  (162.6 cm) Weight: 167 lb 5.3 oz (75.9 kg) IBW/kg (Calculated) : 54.7 TPN AdjBW (KG): 59.7 Body mass index is 28.72 kg/m. Usual Weight: 90kg  Insulin Requirements: 9 units in past 24 hours  Current Nutrition: NPO  IVF: NS @ 31ml/hr  Central access: 12/11/2018 TPN start date: 11/30/18  ASSESSMENT                                                                                                          HPI: She presented 6/7 with perforated small bowel from closed-loop obstruction and underwent emergent exploratory laparotomy.  She returned to OR on 6/9 and again on 6/14 for closure of abdominal wall. She coded on 12-18-2022 and developed multiorgan failure.  She remains intubated & on CRRT.  Pressors have been weaned off and she is completing antibiotic course for sepsis/PNA.   She has been NPO since admission on 6/7.  Pharmacy consulted to start TPN.  Acute liver injury is improving but will initiate TPN at low rate & advance slowly as requested by surgery.    Significant events:  6/18: OK to increase to goal rate per surgery  Today:   Glucose - Hx DM on 70/30 Novolog 20 units BID PTA.  CBGs at goal <150 today with increase to 55 units insulin in TPN + ICU resistant scale (range 102-127).  Electrolytes - Na slightly low at 134, Phos slightly low at 2.4.    Renal - AKI on CRRT.  512ml NG output on 6/20.  LFTs - Acute liver injury- AST/ALT elevated but trending down (37/89).  TBili WNL (on 6/19)  TGs - 85 (6/9)  Prealbumin - <5 (6/17)  NUTRITIONAL GOALS                                                                                             RD recs:  Kcal:  1530 kcal, Protein:  172-187 grams (2.3-2.5 grams/kg), Fluid:  >/= 1.8 L/day  Custom TPN at goal rate of 75 ml/hr (NO LIPIDS x7 days) to provide: 180 g/day protein, 1515  Kcal/day.  PLAN  Now:  Kphos 34mMol IV x1 today  At 1800 today:  Custom high protein, hypocaloric TPN per recommendations for obese, critically ill patient.  Continue TPN at 75 ml/hr.  Electrolytes in TPN as follows:  Increase Na 72mEq/L, Phos 20Mmol/L, Ca 34mEq/L, add  56mEq/L K to TPN, No Mag.  Max acetate.  Hold 20% lipid emulsion for first 7 days for ICU patients per ASPEN guidelines (Start date 6/23)  TPN to contain standard multivitamins.  Add trace elements on MWF only due to national backorder.  MIVF per MD  Continue resistant ICU glycemic control orders q4h.   Continue 55 units of insulin in TPN  TPN lab panels Mondays & Thursdays.    F/u daily.  Noted plan for tube feeding trial once NG output improves.   Biagio Borg 12/05/2018,9:55 AM

## 2018-12-05 NOTE — Progress Notes (Signed)
Dr. Hollie Salk, nephrologist, made aware of patient's current condition and need for pressors once again. Verbal orders received to not give lasix and restart CRRT.  Will continue to monitor.

## 2018-12-05 NOTE — Progress Notes (Signed)
[facility] Progress Note    Assessment/ Plan:     1. Acute renal failure: last known baseline 0.79 05/2018.  She has acute oliguric AKI in the setting of septic shock due to perf bowel likely 2/2/ ATN. SP CRRT 6/9 - 6/12, held after PEA arrest.  Resumed 6/14 after abdominal closure.  No heparin.  Kept even over the course of yesterday.  CVPs 2-3.  Will hold CRRT if filter clots overnight and consider Lasix challenge.  Cr is 0.9 which is likely a function of CRRT, low muscle mass, and malnutrition rather than functioning kidneys; however, holding CRRT and giving Lasix may help [place] determine if any renal recovery is occurring.    2. Septic shock: s/p courses of Zosyn and fluconazole, now off pressors. WBC 31.7--> 25.4.  CT C/A/P without etiology.  Off pressor this AM.    3. Acute hypoxic respiratory failure: reintubated 6/20, CT head negative.  ? If TIA or seizures causing altered level of consciousness, per primary  4. Perforated small bowel due to closed-loop SBO / septic shock- per CCM/ gen surg, s/p closure 6/14. NGT in place  5. DM: per primary  6. Takatsubo's cardiomyopathy: EF ~ 20%  7. Severe hypoalbuminemia: per primary-- on TPN  8. SP PEA arrest: on 6/13  9. Dispo: remains in ICU  Subjective:    Off pressors.  CT C/A/P with possible enteritis, LLL atalectasis, otherwise not really revealing.  ? Neuro etiology of altered LOC   Objective:   BP (!) 104/54   Pulse (!) 104   Temp 97.9 F (36.6 C) (Oral)   Resp (!) 27   Ht '5\' 4"'  (1.626 m)   Wt 75.9 kg   SpO2 99%   BMI 28.72 kg/m   Intake/Output Summary (Last 24 hours) at 12/05/2018 1235 Last data filed at 12/05/2018 1207 Gross per 24 hour  Intake 2350.02 ml  Output 2003 ml  Net 347.02 ml   Weight change:   Physical Exam: Genintubated, sedated NECK: no JVD  PULM: mechanical breath sounds bilaterally CV: RRR no MRG ABD: soft, abd wall edema, dressings c/d/i, quiet bowel sounds GUfoley cath  minimal urine,  Extanasarca about the same Neuro: intubated, sedated  Imaging: Ct Abdomen Pelvis Wo Contrast  Result Date: 12/04/2018 CLINICAL DATA:  63 year old female with sepsis and elevated white count. History of recent small bowel obstruction, small bowel resection and delayed small bowel anastomosis and closure of abdominal wall. History of breast cancer and RIGHT mastectomy. EXAM: CT CHEST, ABDOMEN AND PELVIS WITHOUT CONTRAST TECHNIQUE: Multidetector CT imaging of the chest, abdomen and pelvis was performed following the standard protocol without IV contrast. COMPARISON:  12/08/2018 abdominal and pelvic CT, 07/31/2014 chest, abdominopelvic CT and other studies. FINDINGS: Please note that parenchymal abnormalities may be missed without intravenous contrast. CT CHEST FINDINGS Cardiovascular: Heart size is normal. Mild thoracic aortic atherosclerotic calcifications noted without aneurysm. No pericardial effusion. A LEFT IJ Port-A-Cath with tip in the LOWER SVC, RIGHT IJ central venous catheter with tip in the LOWER SVC, and RIGHT subclavian central venous catheter with tip in the LOWER SVC noted. Mediastinum/Nodes: An endotracheal tube is identified with tip 3 cm above the carina. An NG tube is noted with tip in the mid stomach. No enlarged lymph nodes or mediastinal mass. The visualized thyroid is unremarkable. Lungs/Pleura: Small bilateral pleural effusions, LEFT-greater-than-RIGHT noted. LEFT LOWER lung atelectasis noted. RIGHT LOWER lung opacity is noted-question consolidation versus atelectasis. Radiation type changes along the anterior RIGHT lung noted. Musculoskeletal:  Fractures of the anterior RIGHT 4th through 7th ribs appear acute to subacute but correlate clinically. No suspicious bony lesions are present. RIGHT mastectomy is identified. CT ABDOMEN PELVIS FINDINGS Hepatobiliary: The liver is unremarkable. The patient is status post cholecystectomy. No definite biliary dilatation. Pancreas:  Mildly atrophic without other abnormality Spleen: Unremarkable Adrenals/Urinary Tract: The kidneys, adrenal glands and bladder are unremarkable except for a Foley catheter within the bladder. Stomach/Bowel: Bowel surgical changes noted. There is mild circumferential wall thickening of scattered small bowel loops and colon of uncertain significance but may represent reactive changes or enteritis/colitis. There is no evidence of bowel obstruction or pneumoperitoneum. A small to moderate amount of ascites is noted as well as fluid along small bowel loops. No discrete collection is definitely identified. Tiny residual foci of free air within the low central abdomen (series 2: Image [id number]) is significantly decreased since 11/20/2018 and likely represents a tiny amount of residual free air. Vascular/Lymphatic: No significant vascular findings are present. No enlarged abdominal or pelvic lymph nodes. Reproductive: Status post hysterectomy. No adnexal masses. Other: Diffuse subcutaneous edema is noted. Anterior abdominal wall surgical changes noted. Musculoskeletal: No acute or suspicious bony abnormalities. Degenerative changes within the RIGHT hip and lumbar spine again noted. IMPRESSION: 1. Small bowel surgical changes with mild circumferential wall thickening of scattered small bowel loops and colon, of uncertain significance. These may represent reactive changes versus enteritis/colitis. No evidence of bowel obstruction. 2. Small bilateral pleural effusions with RIGHT LOWER lung consolidation versus atelectasis. Pneumonia is not excluded. Compressive atelectasis/collapse of the LEFT LOWER lobe. 3. Small to moderate ascites and diffuse subcutaneous edema. 4. Fractures of the anterior RIGHT 4th through 7th ribs, of uncertain chronicity but appear acute to subacute. Correlate clinically. 5. Electronically Signed   By: Margarette Canada M.D.   On: 12/04/2018 19:04   Dg Abd 1 View  Result Date: 12/04/2018 CLINICAL DATA:  NG  tube placement. EXAM: ABDOMEN - 1 VIEW COMPARISON:  Earlier film, same date. FINDINGS: The tip of the NG tube is in the body region of the stomach. The proximal port is in the fundal region below the GE junction. IMPRESSION: NG tube in the stomach in good position without complicating features. Electronically Signed   By: Marijo Sanes M.D.   On: 12/04/2018 14:23   Dg Abd 1 View  Result Date: 12/04/2018 CLINICAL DATA:  NG tube placement EXAM: ABDOMEN - 1 VIEW COMPARISON:  None. FINDINGS: NG tube is identified with tip overlying the proximal stomach and side hole overlying the EG junction. Recommend advancement. IMPRESSION: NG tube with tip overlying the proximal stomach and side hole overlying the EG junction. Recommend advancement. Electronically Signed   By: Margarette Canada M.D.   On: 12/04/2018 13:17   Ct Head Wo Contrast  Result Date: 12/04/2018 CLINICAL DATA:  Altered mental status EXAM: CT HEAD WITHOUT CONTRAST TECHNIQUE: Contiguous axial images were obtained from the base of the skull through the vertex without intravenous contrast. COMPARISON:  CT head 11/26/2017 FINDINGS: Brain: Image quality degraded by motion. Images repeated due to motion Ventricle size and cerebral volume normal. No acute infarct. Negative for hemorrhage or mass. Mild white matter changes appear chronic. Vascular: Negative for hyperdense vessel Skull: Negative Sinuses/Orbits: Paranasal sinuses clear. NG tube in place. Negative orbit Other: None IMPRESSION: No acute abnormality.  Image quality degraded by motion. Electronically Signed   By: Franchot Gallo M.D.   On: 12/04/2018 08:25   Ct Chest Wo Contrast  Result Date: 12/04/2018 CLINICAL  DATA:  63 year old female with sepsis and elevated white count. History of recent small bowel obstruction, small bowel resection and delayed small bowel anastomosis and closure of abdominal wall. History of breast cancer and RIGHT mastectomy. EXAM: CT CHEST, ABDOMEN AND PELVIS WITHOUT CONTRAST  TECHNIQUE: Multidetector CT imaging of the chest, abdomen and pelvis was performed following the standard protocol without IV contrast. COMPARISON:  11/16/2018 abdominal and pelvic CT, 07/31/2014 chest, abdominopelvic CT and other studies. FINDINGS: Please note that parenchymal abnormalities may be missed without intravenous contrast. CT CHEST FINDINGS Cardiovascular: Heart size is normal. Mild thoracic aortic atherosclerotic calcifications noted without aneurysm. No pericardial effusion. A LEFT IJ Port-A-Cath with tip in the LOWER SVC, RIGHT IJ central venous catheter with tip in the LOWER SVC, and RIGHT subclavian central venous catheter with tip in the LOWER SVC noted. Mediastinum/Nodes: An endotracheal tube is identified with tip 3 cm above the carina. An NG tube is noted with tip in the mid stomach. No enlarged lymph nodes or mediastinal mass. The visualized thyroid is unremarkable. Lungs/Pleura: Small bilateral pleural effusions, LEFT-greater-than-RIGHT noted. LEFT LOWER lung atelectasis noted. RIGHT LOWER lung opacity is noted-question consolidation versus atelectasis. Radiation type changes along the anterior RIGHT lung noted. Musculoskeletal: Fractures of the anterior RIGHT 4th through 7th ribs appear acute to subacute but correlate clinically. No suspicious bony lesions are present. RIGHT mastectomy is identified. CT ABDOMEN PELVIS FINDINGS Hepatobiliary: The liver is unremarkable. The patient is status post cholecystectomy. No definite biliary dilatation. Pancreas: Mildly atrophic without other abnormality Spleen: Unremarkable Adrenals/Urinary Tract: The kidneys, adrenal glands and bladder are unremarkable except for a Foley catheter within the bladder. Stomach/Bowel: Bowel surgical changes noted. There is mild circumferential wall thickening of scattered small bowel loops and colon of uncertain significance but may represent reactive changes or enteritis/colitis. There is no evidence of bowel obstruction  or pneumoperitoneum. A small to moderate amount of ascites is noted as well as fluid along small bowel loops. No discrete collection is definitely identified. Tiny residual foci of free air within the low central abdomen (series 2: Image [id number]) is significantly decreased since 12/12/2018 and likely represents a tiny amount of residual free air. Vascular/Lymphatic: No significant vascular findings are present. No enlarged abdominal or pelvic lymph nodes. Reproductive: Status post hysterectomy. No adnexal masses. Other: Diffuse subcutaneous edema is noted. Anterior abdominal wall surgical changes noted. Musculoskeletal: No acute or suspicious bony abnormalities. Degenerative changes within the RIGHT hip and lumbar spine again noted. IMPRESSION: 1. Small bowel surgical changes with mild circumferential wall thickening of scattered small bowel loops and colon, of uncertain significance. These may represent reactive changes versus enteritis/colitis. No evidence of bowel obstruction. 2. Small bilateral pleural effusions with RIGHT LOWER lung consolidation versus atelectasis. Pneumonia is not excluded. Compressive atelectasis/collapse of the LEFT LOWER lobe. 3. Small to moderate ascites and diffuse subcutaneous edema. 4. Fractures of the anterior RIGHT 4th through 7th ribs, of uncertain chronicity but appear acute to subacute. Correlate clinically. 5. Electronically Signed   By: Margarette Canada M.D.   On: 12/04/2018 19:04   Dg Chest Port 1 View  Result Date: 12/04/2018 CLINICAL DATA:  Intubated EXAM: PORTABLE CHEST 1 VIEW COMPARISON:  12/02/2018 chest radiograph. FINDINGS: Endotracheal tube tip is 3.2 cm above the carina. Enteric tube enters stomach with the tip not seen on this image. Right internal jugular central venous catheter terminates in the lower third of the SVC. Right subclavian central venous catheter terminates at the cavoatrial junction. Left subclavian Port-A-Cath terminates in the  lower third of the SVC.  Stable cardiomediastinal silhouette with normal heart size. No pneumothorax. Small right and small to moderate left pleural effusions, increased on the left. Stable mild right basilar atelectasis. Patchy left lung base opacity, increased. IMPRESSION: 1. Well-positioned support structures.  No pneumothorax. 2. Small to moderate left pleural effusion, increased, with increased patchy left lung base opacity. 3. Stable small right pleural effusion with mild right basilar atelectasis. Electronically Signed   By: Ilona Sorrel M.D.   On: 12/04/2018 04:40    Labs: BMET Recent Labs  Lab 12/02/18 0307 12/02/18 1606 12/03/18 0325 12/03/18 1613 12/04/18 0500 12/04/18 1601 12/05/18 0513  NA 137 135 135  135 135 136 136 134*  K 3.9 4.2 3.8  3.8 3.7 4.2 3.7 3.6  CL 102 101 103  103 102 103 104 102  CO2 '22 24 24  24 25 25 23 24  ' GLUCOSE 192* 220* 254*  256* 185* 190* 113* 127*  BUN 34* 32* 41*  40* 39* 40* 42* 42*  CREATININE 1.29* 1.12* 1.24*  1.20* 0.99 1.02* 0.98 0.94  CALCIUM 7.4* 7.3* 7.0*  7.0* 7.3* 7.6* 7.2* 7.2*  PHOS 1.8* 3.3 2.9  2.9 2.6 3.2 2.5 2.4*   CBC Recent Labs  Lab 12/01/18 0222 12/03/18 0325 12/04/18 0500 12/05/18 0724  WBC 23.5* 27.4* 31.7* 25.4*  NEUTROABS 21.8*  --   --   --   HGB 9.9* 8.0* 7.8* 6.5*  HCT 29.6* 24.2* 24.2* 20.0*  MCV 89.2 91.0 93.1 92.2  PLT 67* 66* 76* 87*    Medications:    . chlorhexidine gluconate (MEDLINE KIT)  15 mL Mouth Rinse BID  . Chlorhexidine Gluconate Cloth  6 each Topical Daily  . dorzolamide-timolol  1 drop Both Eyes BID  . insulin aspart  3-9 Units Subcutaneous Q4H  . mouth rinse  15 mL Mouth Rinse 10 times per day  . pantoprazole (PROTONIX) IV  40 mg Intravenous Q24H      Madelon Lips, MD [place] pgr [phone] 12/05/2018, 12:35 PM

## 2018-12-05 NOTE — Progress Notes (Signed)
Phlebotomist contacted this RN in regards to drawing blood cultures due to patient being a difficult stick. RN advised phlebotomist to at least attempt. Phlebotomist to come attempt. Will continue to monitor.

## 2018-12-05 NOTE — Progress Notes (Signed)
Pharmacy Antibiotic Note  Carmen Stone is a 63 y.o. female admitted on 11/16/2018 with perforated loop of SB. Her hospital stay has been complicated by cardiac arrest requiring intubation & pressors as well as CRRT for AKI.  She completed 10d course of Zosyn on 6/17.   She had low grade fever of 100.82F & bradycardia requiring pressors to be resumed today.  WBC has also been trending up off Zosyn.  Chest CT 6/20 could not rule out pneumonia.  Pharmacy has been consulted for Vancomycin & Cefepime dosing.   She remains on CRRT.   Plan:  Cefepime 2gm IV q12h- adjusted for CRRT dosing  Vancomycin 1250mg  IV x1 then 750mg  IV q24h  F/u renal fxn, cx data & clinical course  Height: 5\' 4"  (162.6 cm) Weight: 167 lb 5.3 oz (75.9 kg) IBW/kg (Calculated) : 54.7  Temp (24hrs), Avg:97.3 F (36.3 C), Min:94.6 F (34.8 C), Max:100.5 F (38.1 C)  Recent Labs  Lab 11/30/18 0321  12/01/18 0222  12/03/18 0325 12/03/18 1613 12/04/18 0500 12/04/18 1601 12/05/18 0513 12/05/18 0724  WBC 23.4*  --  23.5*  --  27.4*  --  31.7*  --   --  25.4*  CREATININE 1.58*   < > 1.19*  1.27*   < > 1.24*  1.20* 0.99 1.02* 0.98 0.94  --    < > = values in this interval not displayed.    Estimated Creatinine Clearance: 60.3 mL/min (by C-G formula based on SCr of 0.94 mg/dL).    No Known Allergies  Antimicrobials this admission: 6/7 zosyn > 6/17 6/7 fluconazole > 6/11 6/8 vancomycin >> 6/8, 6/21>> 6/21 Cefepime>>  Dose adjustments this admission: 6/9: patient now in renal failure - adjusted Zosyn from EI to 2.25g IV q6 for CRRT, Diflucan 400mg  q24 remains appropriate for CRRT dosing  Microbiology results: 6/7 Covid 19 negative 6/8 MRSA PCR: negative 6/21 BCx: 6/21 Resp Cx:   Thank you for allowing pharmacy to be a part of this patient's care.  Netta Cedars, PharmD, BCPS 12/05/2018 4:55 PM

## 2018-12-05 NOTE — Progress Notes (Signed)
Unable to obtain blood cultures d/t inability to stick. Central line is 42 days old. Order d/c and will need readdressed by rounding MD as appropriate.

## 2018-12-05 NOTE — Progress Notes (Addendum)
Addendum: Updated patient's brother  NAME:  SHOSHANNA MCQUITTY, MRN:  [id number], DOB:  1955-07-19, LOS: 52 ADMISSION DATE:  11/19/2018, CONSULTATION DATE:  11/21/2018 REFERRING MD:  Dr. Ninfa Linden, Surgery, CHIEF COMPLAINT:  Abdominal pain   Brief History   63 yo female presented to ER with nausea, vomiting, abdominal pain.  CT abd/pelvis showed focal perforated loop of SB with free air with multiple complex ventral hernias.  Had emergent laparotomy with SB resection, lysis of adhesions and wound vac.  Remained on vent and pressors post op.  Cardiorespiratory arrest 11/20/2018-lasted a couple of minutes Extubated 12/03/2018 Reintubated 12/04/2018 for altered mentation, was not able to protect her airway-head CT negative  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, HTN, HLD, DM type II  Significant Hospital Events   6/07 Admit, to OR 6/08 transfuse FFP, on multiple pressors 6/09 To OR for wash out, start CRRT 6/10: Diflucan discontinued due to rising liver function tests 6/12-still on CRRT, no urine output 6/13-acute respiratory arrest overnight, off CRRT, on pressors, limit sedation 6/14 bowel anastomosis and closure of abdominal wall 6/15: Off pressors.  Changing volume goal on CRRT to -100 cc an hour.  Starting to wean.  Fentanyl infusion stopped changed to PRN.  7 hours weaning 6/16 remains encephalopathic.  Requiring intermittent fentanyl.  Tolerating clears to 200 cc negative fluid balance hourly.  She is hypertensive, adding labetalol.  IV TNA started 6/17: A little more awake.  Tolerating pressure support but SBI exceeds 105 on spontaneous breathing trial.  Continuing aggressive volume removal with CRRT, volume status now down to just around 2 L positive.  Decreasing PRN sedation further 6/18: Neuro status continues to improve.  Hemodynamics stable however did require brief vasoactive drips during nighttime.  She is now at an net negative fluid volume status.   Likely nearing euvolemic state.  Tolerating spontaneous breathing trial 6/19 Extubated 6/20 reintubated for altered mentation, head CT negative  Consults:  Renal 6/09 AKI, acidosis Cardiology 6/09 Takotsubo CM   Procedures:  ETT 6/07 >> 6/19 Rt IJ HD 6/09 >> Rt Cobb CVL 6/09 >> Lt radial aline 6/09 >>  Significant Diagnostic Tests:  CT abd/pelvis 6/07 >> focal perforated loop of SB with free air with multiple complex ventral hernias Echo 6/08 >> EF 20%, Takotsubo CM CT head 6/20-negative for any acute findings CT abdomen 6/20-bilateral effusions, atelectasis left lower lobe, results noted.  Film reviewed by myself  Micro Data:  COVID 6/07 >> negative  Antimicrobials:  Zosyn 6/07 >> 6/17 Diflucan 6/07 -6/10  Interim history/subjective:  Remains on vent-reintubated 6/20 On PRVC  Objective   Blood pressure (!) 104/46, pulse 94, temperature (!) 96.1 F (35.6 C), temperature source Axillary, resp. rate (!) 29, height 5\' 4"  (1.626 m), weight 75.9 kg, SpO2 100 %. CVP:  [1 mmHg-8 mmHg] 1 mmHg  Vent Mode: PRVC FiO2 (%):  [30 %-60 %] 30 % Set Rate:  [18 bmp] 18 bmp Vt Set:  [430 mL] 430 mL PEEP:  [8 cmH20] 8 cmH20 Plateau Pressure:  [18 cmH20-28 cmH20] 23 cmH20   Intake/Output Summary (Last 24 hours) at 12/05/2018 5462 Last data filed at 12/05/2018 0800 Gross per 24 hour  Intake 2075.34 ml  Output 1652 ml  Net 423.34 ml   Filed Weights   12/02/18 0407 12/03/18 0500 12/05/18 0500  Weight: 76 kg 74.2 kg 75.9 kg    Examination: Middle-aged lady, responds to verbal stimuli Pupils reactive Endotracheal tube in place S1-S2 appreciated Abdomen  is soft, bowel sounds appreciated Extremities shows no clubbing, she does have edema Moving extremities  Chest x-ray reviewed by myself showing pleural effusion Head CT results reviewed CT abdomen reviewed  Resolved Hospital Problem list   Metabolic alkalosis Abdominal peritonitis with septic shock Circulatory shock, mix of  cardiogenic and sepsis off pressors as of 6/15 Cardiorespiratory arrest 6/13 Shock liver Acute metabolic encephalopathy Hypophosphatemia  Assessment & Plan:   Acute change in mental status -Head CT negative -Mental status improving -Labs are remarkable, had similar event about a week ago -Continue to monitor closely  Status post small bowel resection, lysis of adhesions Reexploration 6/14, creation of functional end to end small bowel anastomosis and closure of abdominal wound -Wound care -Continue TPN  Anemia -Transfuse 1 unit packed red blood cells   Acute hypoxic respiratory failure in setting of peritonitis -Continue ventilator support -Weaning as tolerated  Atelectasis/pleural effusion -No fever, no change in leukocytosis -Will send tracheal aspirate for cultures -We will monitor closely for any evidence of possible infection -Atelectasis likely related to complicated abdominal process, poor mobility  Acute systolic congestive heart failure with ejection fraction of 20%, Takotsubo cardiomyopathy -Hold lisinopril, Pravachol -PRN hydralazine -Repeat echo in July  Acute kidney injury from tubular necrosis, baseline creatinine of 0.73 from 11/24/2018 -Defer to renal -On CRRT  Type 2 diabetes -Hypoglycemia 3 protocol  Thrombocytopenia from sepsis -Trend CBC  Altered mentation may be related to TIAs -2 negative CTs so far  Weaning and possible extubation if she continues to stabilize following transfusion  Best practice:  Diet: Continue parenteral nutrition DVT prophylaxis: SCDs, hold subcu heparin today GI prophylaxis: Protonix Mobility: Bedrest Code Status: Full code Disposition: ICU  Labs:   CMP Latest Ref Rng & Units 12/05/2018 12/04/2018 12/04/2018  Glucose 70 - 99 mg/dL 127(H) 113(H) 190(H)  BUN 8 - 23 mg/dL 42(H) 42(H) 40(H)  Creatinine 0.44 - 1.00 mg/dL 0.94 0.98 1.02(H)  Sodium 135 - 145 mmol/L 134(L) 136 136  Potassium 3.5 - 5.1 mmol/L 3.6 3.7  4.2  Chloride 98 - 111 mmol/L 102 104 103  CO2 22 - 32 mmol/L 24 23 25   Calcium 8.9 - 10.3 mg/dL 7.2(L) 7.2(L) 7.6(L)  Total Protein 6.5 - 8.1 g/dL - - -  Total Bilirubin 0.3 - 1.2 mg/dL - - -  Alkaline Phos 38 - 126 U/L - - -  AST 15 - 41 U/L - - -  ALT 0 - 44 U/L - - -   CBC Latest Ref Rng & Units 12/05/2018 12/04/2018 12/03/2018  WBC 4.0 - 10.5 K/uL 25.4(H) 31.7(H) 27.4(H)  Hemoglobin 12.0 - 15.0 g/dL 6.5(LL) 7.8(L) 8.0(L)  Hematocrit 36.0 - 46.0 % 20.0(L) 24.2(L) 24.2(L)  Platelets 150 - 400 K/uL 87(L) 76(L) 66(L)   ABG    Component Value Date/Time   PHART 7.445 12/04/2018 0435   PCO2ART 37.4 12/04/2018 0435   PO2ART 48.6 (L) 12/04/2018 0435   HCO3 25.3 12/04/2018 0435   TCO2 16 (L) 11/20/2018 1658   ACIDBASEDEF 0.8 12/04/2018 0215   O2SAT 83.6 12/04/2018 0435   CBG (last 3)  Recent Labs    12/04/18 2330 12/05/18 0344 12/05/18 0740  GLUCAP 102* 102* 119*   The patient is critically ill with multiple organ system failure and requires high complexity decision making for assessment and support, frequent evaluation and titration of therapies, advanced monitoring, review of radiographic studies and interpretation of complex data.    Critical Care Time devoted to patient care services, exclusive of separately billable  procedures, described in this note is 32 minutes.  Sherrilyn Rist, MD [place], PCCM Cell: [phone]  Addendum:  Updated patient's brother

## 2018-12-05 NOTE — Progress Notes (Signed)
eLink Physician-Brief Progress Note Patient Name: Carmen Stone DOB: February 17, 1956 MRN: [id number]   Date of Service  12/05/2018  HPI/Events of Note  Notified of hypoglycemia at 73.  Pt is getting TPN but tpn with insulin.  eICU Interventions  Give D50 now.  Continue monitoring glucoses q4hrs.  Need to readjust TPN in the AM.     Intervention Category Minor Interventions: Other:  Elsie Lincoln 12/05/2018, 11:57 PM

## 2018-12-06 LAB — PREALBUMIN: Prealbumin: 7.9 mg/dL — ABNORMAL LOW (ref 18–38)

## 2018-12-06 LAB — GLUCOSE, CAPILLARY
Glucose-Capillary: 103 mg/dL — ABNORMAL HIGH (ref 70–99)
Glucose-Capillary: 108 mg/dL — ABNORMAL HIGH (ref 70–99)
Glucose-Capillary: 149 mg/dL — ABNORMAL HIGH (ref 70–99)
Glucose-Capillary: 167 mg/dL — ABNORMAL HIGH (ref 70–99)
Glucose-Capillary: 77 mg/dL (ref 70–99)
Glucose-Capillary: 90 mg/dL (ref 70–99)
Glucose-Capillary: 98 mg/dL (ref 70–99)

## 2018-12-06 LAB — COMPREHENSIVE METABOLIC PANEL
ALT: 53 U/L — ABNORMAL HIGH (ref 0–44)
AST: 32 U/L (ref 15–41)
Albumin: 2 g/dL — ABNORMAL LOW (ref 3.5–5.0)
Alkaline Phosphatase: 285 U/L — ABNORMAL HIGH (ref 38–126)
Anion gap: 9 (ref 5–15)
BUN: 47 mg/dL — ABNORMAL HIGH (ref 8–23)
CO2: 25 mmol/L (ref 22–32)
Calcium: 7.6 mg/dL — ABNORMAL LOW (ref 8.9–10.3)
Chloride: 101 mmol/L (ref 98–111)
Creatinine, Ser: 1.09 mg/dL — ABNORMAL HIGH (ref 0.44–1.00)
GFR calc Af Amer: >60 mL/min (ref >60)
GFR calc non Af Amer: 54 mL/min — ABNORMAL LOW (ref >60)
Glucose, Bld: 105 mg/dL — ABNORMAL HIGH (ref 70–99)
Potassium: 4.1 mmol/L (ref 3.5–5.1)
Sodium: 135 mmol/L (ref 135–145)
Total Bilirubin: 1.2 mg/dL (ref 0.3–1.2)
Total Protein: 5.8 g/dL — ABNORMAL LOW (ref 6.5–8.1)

## 2018-12-06 LAB — RENAL FUNCTION PANEL
Albumin: 1.9 g/dL — ABNORMAL LOW (ref 3.5–5.0)
Anion gap: 9 (ref 5–15)
BUN: 44 mg/dL — ABNORMAL HIGH (ref 8–23)
CO2: 24 mmol/L (ref 22–32)
Calcium: 7.5 mg/dL — ABNORMAL LOW (ref 8.9–10.3)
Chloride: 101 mmol/L (ref 98–111)
Creatinine, Ser: 1.03 mg/dL — ABNORMAL HIGH (ref 0.44–1.00)
GFR calc Af Amer: >60 mL/min (ref >60)
GFR calc non Af Amer: 57 mL/min — ABNORMAL LOW (ref >60)
Glucose, Bld: 103 mg/dL — ABNORMAL HIGH (ref 70–99)
Phosphorus: 3.1 mg/dL (ref 2.5–4.6)
Potassium: 4.1 mmol/L (ref 3.5–5.1)
Sodium: 134 mmol/L — ABNORMAL LOW (ref 135–145)

## 2018-12-06 LAB — CBC
HCT: 23 % — ABNORMAL LOW (ref 36.0–46.0)
Hemoglobin: 7.5 g/dL — ABNORMAL LOW (ref 12.0–15.0)
MCH: 29.6 pg (ref 26.0–34.0)
MCHC: 32.6 g/dL (ref 30.0–36.0)
MCV: 90.9 fL (ref 80.0–100.0)
Platelets: 85 10*3/uL — ABNORMAL LOW (ref 150–400)
RBC: 2.53 MIL/uL — ABNORMAL LOW (ref 3.87–5.11)
RDW: 17.2 % — ABNORMAL HIGH (ref 11.5–15.5)
WBC: 24 10*3/uL — ABNORMAL HIGH (ref 4.0–10.5)
nRBC: 0 % (ref 0.0–0.2)

## 2018-12-06 LAB — DIFFERENTIAL
Abs Immature Granulocytes: 0.27 10*3/uL — ABNORMAL HIGH (ref 0.00–0.07)
Basophils Absolute: 0.1 10*3/uL (ref 0.0–0.1)
Basophils Relative: 0 %
Eosinophils Absolute: 0 10*3/uL (ref 0.0–0.5)
Eosinophils Relative: 0 %
Immature Granulocytes: 1 %
Lymphocytes Relative: 4 %
Lymphs Abs: 1 10*3/uL (ref 0.7–4.0)
Monocytes Absolute: 1.4 10*3/uL — ABNORMAL HIGH (ref 0.1–1.0)
Monocytes Relative: 6 %
Neutro Abs: 21.2 10*3/uL — ABNORMAL HIGH (ref 1.7–7.7)
Neutrophils Relative %: 89 %

## 2018-12-06 LAB — MAGNESIUM: Magnesium: 2.3 mg/dL (ref 1.7–2.4)

## 2018-12-06 LAB — PHOSPHORUS: Phosphorus: 3.2 mg/dL (ref 2.5–4.6)

## 2018-12-06 LAB — TRIGLYCERIDES: Triglycerides: 81 mg/dL (ref <150)

## 2018-12-06 MED ORDER — TRACE MINERALS CR-CU-MN-SE-ZN 10-1000-500-60 MCG/ML IV SOLN
INTRAVENOUS | Status: DC
  Filled 2018-12-06: qty 1200

## 2018-12-06 MED ORDER — VITAL HIGH PROTEIN PO LIQD
1000.0000 mL | ORAL | Status: DC
  Administered 2018-12-06 – 2018-12-08 (×3): 1000 mL

## 2018-12-06 MED ORDER — FENTANYL CITRATE (PF) 100 MCG/2ML IJ SOLN
25.0000 ug | INTRAMUSCULAR | Status: DC | PRN
  Administered 2018-12-08 – 2018-12-24 (×23): 25 ug via INTRAVENOUS
  Filled 2018-12-06 (×13): qty 2

## 2018-12-06 MED ORDER — TRACE MINERALS CR-CU-MN-SE-ZN 10-1000-500-60 MCG/ML IV SOLN
INTRAVENOUS | Status: AC
  Administered 2018-12-06: 18:00:00 via INTRAVENOUS
  Filled 2018-12-06: qty 840

## 2018-12-06 NOTE — Progress Notes (Signed)
[facility] Progress Note   Subjective:    Off pressors this am.    Objective:   BP (!) 113/51   Pulse 99   Temp 98.6 F (37 C) (Oral)   Resp (!) 24   Ht '5\' 4"'  (1.626 m)   Wt 76.9 kg   SpO2 100%   BMI 29.10 kg/m   Intake/Output Summary (Last 24 hours) at 12/06/2018 1515 Last data filed at 12/06/2018 1153 Gross per 24 hour  Intake 2461.1 ml  Output 1953 ml  Net 508.1 ml   Weight change: 1 kg  Physical Exam: Genintubated, sedated NECK: no JVD  PULM: mechanical breath sounds bilaterally CV: RRR no MRG ABD: soft, abd wall edema, dressings c/d/i, quiet bowel sounds GUfoley cath minimal urine,  Extanasarca about the same Neuro: intubated, sedated   Assessment/ Plan:     1. Acute renal failure: last known baseline 0.79 05/2018.  She has acute oliguric AKI in the setting of septic shock due to perf bowel likely 2/2/ ATN. SP CRRT 6/9 - 6/12, held after PEA arrest.  Resumed 6/14 after abdominal closure.  No heparin.  Keeping even.  CVPs 2-3. Cr is 0.9 which is likely a function of CRRT, low muscle mass, and malnutrition rather than functioning kidneys.  Will hold CRRT for a day or two, see if there is any signs of renal recovery.    2. Septic shock: s/p courses of Zosyn and fluconazole, now off pressors. WBC 31.7--> 25.4.  CT C/A/P without etiology.  Off pressor this AM.    3. Acute hypoxic respiratory failure: reintubated 6/20, CT head negative.  ? If TIA or seizures causing altered level of consciousness, per primary  4. Perforated small bowel due to closed-loop SBO / septic shock- per CCM/ gen surg, s/p closure 6/14. NGT in place  5. DM: per primary  6. Takatsubo's cardiomyopathy: EF ~ 20%  7. Severe hypoalbuminemia: per primary-- on TPN  8. SP PEA arrest: on 6/13  9. Dispo: remains in ICU  Kelly Splinter, MD 12/06/2018, 3:16 PM    Labs: BMET Recent Labs  Lab 12/03/18 0325 12/03/18 1613 12/04/18 0500 12/04/18 1601 12/05/18 0513  12/05/18 1600 12/06/18 0228  NA 135  135 135 136 136 134* 131* 134*  135  K 3.8  3.8 3.7 4.2 3.7 3.6 4.2 4.1  4.1  CL 103  103 102 103 104 102 98 101  101  CO2 '24  24 25 25 23 24 22 24  25  ' GLUCOSE 254*  256* 185* 190* 113* 127* 164* 103*  105*  BUN 41*  40* 39* 40* 42* 42* 53* 44*  47*  CREATININE 1.24*  1.20* 0.99 1.02* 0.98 0.94 1.25* 1.03*  1.09*  CALCIUM 7.0*  7.0* 7.3* 7.6* 7.2* 7.2* 7.5* 7.5*  7.6*  PHOS 2.9  2.9 2.6 3.2 2.5 2.4* 4.4 3.1  3.2   CBC Recent Labs  Lab 12/01/18 0222 12/03/18 0325 12/04/18 0500 12/05/18 0724 12/05/18 1405 12/06/18 0228  WBC 23.5* 27.4* 31.7* 25.4*  --  24.0*  NEUTROABS 21.8*  --   --   --   --  21.2*  HGB 9.9* 8.0* 7.8* 6.5* 8.0* 7.5*  HCT 29.6* 24.2* 24.2* 20.0* 24.2* 23.0*  MCV 89.2 91.0 93.1 92.2  --  90.9  PLT 67* 66* 76* 87*  --  85*    Medications:    . chlorhexidine gluconate (MEDLINE KIT)  15 mL Mouth Rinse BID  . Chlorhexidine Gluconate Cloth  6  each Topical Daily  . dorzolamide-timolol  1 drop Both Eyes BID  . feeding supplement (VITAL HIGH PROTEIN)  1,000 mL Per Tube Q24H  . furosemide  40 mg Intravenous Once  . insulin aspart  3-9 Units Subcutaneous Q4H  . mouth rinse  15 mL Mouth Rinse 10 times per day  . pantoprazole (PROTONIX) IV  40 mg Intravenous Q24H

## 2018-12-06 NOTE — Progress Notes (Signed)
Nutrition Follow-up  DOCUMENTATION CODES:   Not applicable  INTERVENTION:  - recommend replace NGT (placed 6/7) with small bore NGT - will order trickle TF: Vital High Protein @ 15 ml/hr which will provide 360 kcal, 31 grams protein, and 301 ml free water. - goal TF regimen is Vital AF 1.2 @ 50 ml/hr with 30 ml prostat TID to provide 1740 kcal (95% estimated kcal need), 135 grams protein, and 973 ml free water.    NUTRITION DIAGNOSIS:   Inadequate oral intake related to inability to eat as evidenced by NPO status. -ongoing  GOAL:   Patient will meet greater than or equal to 90% of their needs -met with nutrition support  MONITOR:   Vent status, TF tolerance, Labs, Weight trends, Skin, Other (Comment)(TPN regimen)  REASON FOR ASSESSMENT:   Consult Enteral/tube feeding initiation and management  ASSESSMENT:   63 year old female with past medical history of type 2 DM, hyperlipidemia, HTN, inflammatory R breast cancer s/p radiation in 2015. Patient presented to the ED on 6/7 with severe abdominal pain, N/V. CT abdomen/pelvis which showed perforated small bowel with large amount of free air and multiple incarcerated incisional hernias.   Significant Events: 6/7- admission; ex lap, small bowel resection, LOAs, and placement of wound vac; NGT placement 6/9- CRRT initiation; return to OR for re-opening of open abdomen with washout 6/12- CRRT stopped 6/14- bowel anastomosis and closure of abdominal wall; CRRT re-started 6/16- initiation of TPN 6/19- extubation 6/20- re-intubation for AMS 6/21- CRRT stopped for break   Patient remains intubated following need for re-intubation by anesthesiology 6/20 at ~3:50 AM. NGT in place with ~125 ml output in canister. RN reports that ~115 ml of this is from this shift. RN aware of plan to start trickle TF today. Re-estimated protein needs today d/t patient now off CRRT; although notes indicate plan for transfer [facility] for [facility]HD so may need to  adjust again in the near future. Per RN flow sheet, moderate pitting edema to BLE and deep pitting edema to BUE.   She has triple lumen CVC in R subclavian (placed 6/9) and is receiving custom TPN at goal rate of 75 ml/hr (lipids on hold currently) which is providing 1515 kcal (83% re-estimated kcal need) and 180 grams protein. Able to communicate with Pharmacist concerning updated protein needs. Will order TF as outlined above.  Per notes: acute metabolic encephalopathy with negative head CT, post-op and wound care per Surgery, TPN, anemia, plan for daily WUA, atelectasis/pleural effusion, AKI d/t tubular necrosis, difficult IV access with possible need for IR assistance in placing a new line.    Patient is currently intubated on ventilator support MV: 13.8 L/min Temp (24hrs), Avg:98.4 F (36.9 C), Min:96.3 F (35.7 C), Max:100.5 F (38.1 C) BP: 106/47 and MAP: 67  Medications reviewed; 25 g albumin x1 dose 6/21, 25 ml D50 x1 dose 6/22, 40 mg IV lasix x1 dose 6/21, sliding scale novolog, 20 mmol IV KPhos x1 run 6/21. Labs reviewed; CBGs:149, 77, and 90 mg/dl today, Na: 134 mmol/l, BUN: 44 mg/dl, creatinine: 1.03 mg/dl, Ca: 7.5 mg/dl, Alk Phos elevated.     Diet Order:   Diet Order    None      EDUCATION NEEDS:   No education needs have been identified at this time  Skin:  Skin Assessment: Skin Integrity Issues: Skin Integrity Issues:: Stage II Stage II: L ear Wound Vac: abdomen Incisions: abdomen (6/7, 6/9, 6/13)  Last BM:  6/19  Height:   Ht  Readings from Last 1 Encounters:  12/04/18 _0  (1.626 m)    Weight:   Wt Readings from Last 1 Encounters:  12/06/18 76.9 kg    Ideal Body Weight:  54.5 kg  BMI:  Body mass index is 29.1 kg/m.  Estimated Nutritional Needs:   Kcal:  1835 kcal  Protein:  123-138 grams (1.6-1.8 grams/kg)  Fluid:  >/= 1.8 L/day     Jarome Matin, MS, RD, LDN, [facility] Inpatient Clinical Dietitian Pager # [phone] After  hours/weekend pager # [phone]

## 2018-12-06 NOTE — Progress Notes (Addendum)
[facility] NOTE   Pharmacy Consult for TPN Indication: prolonged ileus  Patient Measurements: Height: '5\' 4"'  (162.6 cm) Weight: 169 lb 8.5 oz (76.9 kg) IBW/kg (Calculated) : 54.7 TPN AdjBW (KG): 59.7 Body mass index is 29.1 kg/m. Usual Weight: 90kg  Insulin Requirements: 9 units in past 24 hours  Current Nutrition: NPO  IVF: NS @ 40m/hr  Central access: 12/12/2018 TPN start date: 11/30/18  ASSESSMENT                                                                                                          HPI: [id number] admitted 6/7 with perforated small bowel from closed-loop obstruction and underwent emergent exploratory laparotomy. Returned to OR on 6/9 and again on 6/14 for closure of abdominal wall.  Patient coded on 606/29/24and developed multiorgan failure. Remains intubated & on CRRT. Pressors have been weaned off and she is completing antibiotic course for sepsis/PNA.  NPO since admission on 6/7. Pharmacy consulted to start TPN 6/16. D/t acute liver injury from shock, initiated TPN at low rate & advance slowly as requested by surgery.  Significant events:  6/18: OK to increase to goal rate per surgery 6/22: CBG 69 overnight, D50 given but TPN continued (55 units insulin in TPN); BS and BM overnight - attempting trickle feeds  Today:   Glucose - Hx DM on 70/30 Novolog 20 units BID PTA.  55 units insulin in TPN + ICU resistant scale  CBGs previously OK (range 96-144); hypoglycemic overnight to 69 but TPN continued; CBGs improving this AM but still < 100  9 units SSI given yesterday  Electrolytes - Na borderline low, others WNL  Renal - AKI; holding CRRT today, Lasix challenge to see if renal function returning  I/O - 350 ml NGT output yesterday, switching to TF today  LFTs - ALI improving; AST/ALT essentially back to baseline; Alk phos still elevated; TBili WNL; albumin remains low  TGs - WNL  Prealbumin - remains low but  improving  NUTRITIONAL GOALS                                                                                             RD recs: (6/22 - off CRRT) Kcal:  1835 kcal Protein:  123-138 grams (1.6-1.8 grams/kg) Fluid:  >/= 1.8 L/day  Custom TPN at goal rate of 75 ml/hr (NO LIPIDS x7 days) to provide: 126 g/day protein, 1544 Kcal/day.  PLAN  At 1800 today:  Continue TPN at 75 ml/hr; adjusted protein down and dextrose up based on new RD recs; may be able to start weaning TPN if tolerating trickle feeds > 24 hr  Electrolytes: Increased Na and Phos, hold Mag, low K; Cl:Ac ratio = 1:2  Hold 20% lipid emulsion for first 7 days for ICU patients per ASPEN guidelines (Start date 6/23)  TPN to contain standard multivitamins, trace elements MWF only due to national backorder.  IVF per MD  Continue resistant ICU glycemic control orders q4h; will reduce insulin in TPN to 45 units  TPN lab panels Mondays & Thursdays, will check Bmet, Mg, Phos tomorrow with CRRT on hold   Reuel Boom, PharmD, BCPS [phone] 12/06/2018, 7:51 AM

## 2018-12-06 NOTE — Progress Notes (Signed)
Patient ID: Carmen Stone, female   DOB: 07/18/1955, 63 y.o.   MRN: [id number]    8 Days Post-Op  Subjective: Trying to wean while I'm present.  Put back on full support as after just a few minutes she was already having tachypnea.  Had BM overnight.  Minimal NGT output.  Minimal UOP still.  10cc overnight.  Objective: Vital signs in last 24 hours: Temp:  [97.3 F (36.3 C)-100.5 F (38.1 C)] 97.5 F (36.4 C) (06/22 0300) Pulse Rate:  [91-126] 104 (06/22 0800) Resp:  [15-36] 15 (06/22 0800) BP: (76-182)/(37-72) 182/72 (06/22 0800) SpO2:  [99 %-100 %] 100 % (06/22 0800) FiO2 (%):  [30 %] 30 % (06/22 0758) Weight:  [76.9 kg] 76.9 kg (06/22 0231) Last BM Date: 12/05/18  Intake/Output from previous day: 06/21 0701 - 06/22 0700 In: 3408 [I.V.:2078; Blood:293.3; IV Piggyback:1036.8] Out: 2691 [Urine:15; Emesis/NG output:325] Intake/Output this shift: Total I/O In: 75.1 [I.V.:75.1] Out: 91 [Other:91]  PE: Gen: critically ill on vent Heart: regular Lungs: Diffuse rhonchi noted.  On vent Abd: soft, +BS, minimal NGT output.  Midline wound with pale, tan adipose tissue.  No fascial separation noted.  Dressing in place.  Lab Results:  Recent Labs    12/05/18 0724 12/05/18 1405 12/06/18 0228  WBC 25.4*  --  24.0*  HGB 6.5* 8.0* 7.5*  HCT 20.0* 24.2* 23.0*  PLT 87*  --  85*   BMET Recent Labs    12/05/18 1600 12/06/18 0228  NA 131* 134*   135  K 4.2 4.1   4.1  CL 98 101   101  CO2 22 24   25   GLUCOSE 164* 103*   105*  BUN 53* 44*   47*  CREATININE 1.25* 1.03*   1.09*  CALCIUM 7.5* 7.5*   7.6*   PT/INR No results for input(s): LABPROT, INR in the last 72 hours. CMP     Component Value Date/Time   NA 134 (L) 12/06/2018 0228   NA 135 12/06/2018 0228   NA 138 06/01/2017 0859   K 4.1 12/06/2018 0228   K 4.1 12/06/2018 0228   K 4.2 06/01/2017 0859   CL 101 12/06/2018 0228   CL 101 12/06/2018 0228   CO2 24 12/06/2018 0228   CO2 25 12/06/2018 0228   CO2 24  06/01/2017 0859   GLUCOSE 103 (H) 12/06/2018 0228   GLUCOSE 105 (H) 12/06/2018 0228   GLUCOSE 118 06/01/2017 0859   BUN 44 (H) 12/06/2018 0228   BUN 47 (H) 12/06/2018 0228   BUN 15.2 06/01/2017 0859   CREATININE 1.03 (H) 12/06/2018 0228   CREATININE 1.09 (H) 12/06/2018 0228   CREATININE 0.79 06/02/2018 0856   CREATININE 0.7 06/01/2017 0859   CALCIUM 7.5 (L) 12/06/2018 0228   CALCIUM 7.6 (L) 12/06/2018 0228   CALCIUM 9.0 06/01/2017 0859   PROT 5.8 (L) 12/06/2018 0228   PROT 7.0 06/01/2017 0859   ALBUMIN 1.9 (L) 12/06/2018 0228   ALBUMIN 2.0 (L) 12/06/2018 0228   ALBUMIN 3.7 06/01/2017 0859   AST 32 12/06/2018 0228   AST 13 (L) 06/02/2018 0856   AST 14 06/01/2017 0859   ALT 53 (H) 12/06/2018 0228   ALT 16 06/02/2018 0856   ALT 12 06/01/2017 0859   ALKPHOS 285 (H) 12/06/2018 0228   ALKPHOS 97 06/01/2017 0859   BILITOT 1.2 12/06/2018 0228   BILITOT 0.3 06/02/2018 0856   BILITOT 0.22 06/01/2017 0859   GFRNONAA 57 (L) 12/06/2018 0228   GFRNONAA 54 (  L) 12/06/2018 0228   GFRNONAA >60 06/02/2018 0856   GFRAA >60 12/06/2018 0228   GFRAA >60 12/06/2018 0228   GFRAA >60 06/02/2018 0856   Lipase     Component Value Date/Time   LIPASE 24 12/08/2018 0355       Studies/Results: Ct Abdomen Pelvis Wo Contrast  Result Date: 12/04/2018 CLINICAL DATA:  63 year old female with sepsis and elevated white count. History of recent small bowel obstruction, small bowel resection and delayed small bowel anastomosis and closure of abdominal wall. History of breast cancer and RIGHT mastectomy. EXAM: CT CHEST, ABDOMEN AND PELVIS WITHOUT CONTRAST TECHNIQUE: Multidetector CT imaging of the chest, abdomen and pelvis was performed following the standard protocol without IV contrast. COMPARISON:  11/20/2018 abdominal and pelvic CT, 07/31/2014 chest, abdominopelvic CT and other studies. FINDINGS: Please note that parenchymal abnormalities may be missed without intravenous contrast. CT CHEST FINDINGS  Cardiovascular: Heart size is normal. Mild thoracic aortic atherosclerotic calcifications noted without aneurysm. No pericardial effusion. A LEFT IJ Port-A-Cath with tip in the LOWER SVC, RIGHT IJ central venous catheter with tip in the LOWER SVC, and RIGHT subclavian central venous catheter with tip in the LOWER SVC noted. Mediastinum/Nodes: An endotracheal tube is identified with tip 3 cm above the carina. An NG tube is noted with tip in the mid stomach. No enlarged lymph nodes or mediastinal mass. The visualized thyroid is unremarkable. Lungs/Pleura: Small bilateral pleural effusions, LEFT-greater-than-RIGHT noted. LEFT LOWER lung atelectasis noted. RIGHT LOWER lung opacity is noted-question consolidation versus atelectasis. Radiation type changes along the anterior RIGHT lung noted. Musculoskeletal: Fractures of the anterior RIGHT 4th through 7th ribs appear acute to subacute but correlate clinically. No suspicious bony lesions are present. RIGHT mastectomy is identified. CT ABDOMEN PELVIS FINDINGS Hepatobiliary: The liver is unremarkable. The patient is status post cholecystectomy. No definite biliary dilatation. Pancreas: Mildly atrophic without other abnormality Spleen: Unremarkable Adrenals/Urinary Tract: The kidneys, adrenal glands and bladder are unremarkable except for a Foley catheter within the bladder. Stomach/Bowel: Bowel surgical changes noted. There is mild circumferential wall thickening of scattered small bowel loops and colon of uncertain significance but may represent reactive changes or enteritis/colitis. There is no evidence of bowel obstruction or pneumoperitoneum. A small to moderate amount of ascites is noted as well as fluid along small bowel loops. No discrete collection is definitely identified. Tiny residual foci of free air within the low central abdomen (series 2: Image [id number]) is significantly decreased since 12/03/2018 and likely represents a tiny amount of residual free air.  Vascular/Lymphatic: No significant vascular findings are present. No enlarged abdominal or pelvic lymph nodes. Reproductive: Status post hysterectomy. No adnexal masses. Other: Diffuse subcutaneous edema is noted. Anterior abdominal wall surgical changes noted. Musculoskeletal: No acute or suspicious bony abnormalities. Degenerative changes within the RIGHT hip and lumbar spine again noted. IMPRESSION: 1. Small bowel surgical changes with mild circumferential wall thickening of scattered small bowel loops and colon, of uncertain significance. These may represent reactive changes versus enteritis/colitis. No evidence of bowel obstruction. 2. Small bilateral pleural effusions with RIGHT LOWER lung consolidation versus atelectasis. Pneumonia is not excluded. Compressive atelectasis/collapse of the LEFT LOWER lobe. 3. Small to moderate ascites and diffuse subcutaneous edema. 4. Fractures of the anterior RIGHT 4th through 7th ribs, of uncertain chronicity but appear acute to subacute. Correlate clinically. 5. Electronically Signed   By: Margarette Canada M.D.   On: 12/04/2018 19:04   Dg Abd 1 View  Result Date: 12/04/2018 CLINICAL DATA:  NG tube placement. EXAM:  ABDOMEN - 1 VIEW COMPARISON:  Earlier film, same date. FINDINGS: The tip of the NG tube is in the body region of the stomach. The proximal port is in the fundal region below the GE junction. IMPRESSION: NG tube in the stomach in good position without complicating features. Electronically Signed   By: Marijo Sanes M.D.   On: 12/04/2018 14:23   Dg Abd 1 View  Result Date: 12/04/2018 CLINICAL DATA:  NG tube placement EXAM: ABDOMEN - 1 VIEW COMPARISON:  None. FINDINGS: NG tube is identified with tip overlying the proximal stomach and side hole overlying the EG junction. Recommend advancement. IMPRESSION: NG tube with tip overlying the proximal stomach and side hole overlying the EG junction. Recommend advancement. Electronically Signed   By: Margarette Canada M.D.   On:  12/04/2018 13:17   Ct Chest Wo Contrast  Result Date: 12/04/2018 CLINICAL DATA:  64 year old female with sepsis and elevated white count. History of recent small bowel obstruction, small bowel resection and delayed small bowel anastomosis and closure of abdominal wall. History of breast cancer and RIGHT mastectomy. EXAM: CT CHEST, ABDOMEN AND PELVIS WITHOUT CONTRAST TECHNIQUE: Multidetector CT imaging of the chest, abdomen and pelvis was performed following the standard protocol without IV contrast. COMPARISON:  12/03/2018 abdominal and pelvic CT, 07/31/2014 chest, abdominopelvic CT and other studies. FINDINGS: Please note that parenchymal abnormalities may be missed without intravenous contrast. CT CHEST FINDINGS Cardiovascular: Heart size is normal. Mild thoracic aortic atherosclerotic calcifications noted without aneurysm. No pericardial effusion. A LEFT IJ Port-A-Cath with tip in the LOWER SVC, RIGHT IJ central venous catheter with tip in the LOWER SVC, and RIGHT subclavian central venous catheter with tip in the LOWER SVC noted. Mediastinum/Nodes: An endotracheal tube is identified with tip 3 cm above the carina. An NG tube is noted with tip in the mid stomach. No enlarged lymph nodes or mediastinal mass. The visualized thyroid is unremarkable. Lungs/Pleura: Small bilateral pleural effusions, LEFT-greater-than-RIGHT noted. LEFT LOWER lung atelectasis noted. RIGHT LOWER lung opacity is noted-question consolidation versus atelectasis. Radiation type changes along the anterior RIGHT lung noted. Musculoskeletal: Fractures of the anterior RIGHT 4th through 7th ribs appear acute to subacute but correlate clinically. No suspicious bony lesions are present. RIGHT mastectomy is identified. CT ABDOMEN PELVIS FINDINGS Hepatobiliary: The liver is unremarkable. The patient is status post cholecystectomy. No definite biliary dilatation. Pancreas: Mildly atrophic without other abnormality Spleen: Unremarkable  Adrenals/Urinary Tract: The kidneys, adrenal glands and bladder are unremarkable except for a Foley catheter within the bladder. Stomach/Bowel: Bowel surgical changes noted. There is mild circumferential wall thickening of scattered small bowel loops and colon of uncertain significance but may represent reactive changes or enteritis/colitis. There is no evidence of bowel obstruction or pneumoperitoneum. A small to moderate amount of ascites is noted as well as fluid along small bowel loops. No discrete collection is definitely identified. Tiny residual foci of free air within the low central abdomen (series 2: Image [id number]) is significantly decreased since 11/22/2018 and likely represents a tiny amount of residual free air. Vascular/Lymphatic: No significant vascular findings are present. No enlarged abdominal or pelvic lymph nodes. Reproductive: Status post hysterectomy. No adnexal masses. Other: Diffuse subcutaneous edema is noted. Anterior abdominal wall surgical changes noted. Musculoskeletal: No acute or suspicious bony abnormalities. Degenerative changes within the RIGHT hip and lumbar spine again noted. IMPRESSION: 1. Small bowel surgical changes with mild circumferential wall thickening of scattered small bowel loops and colon, of uncertain significance. These may represent reactive changes versus  enteritis/colitis. No evidence of bowel obstruction. 2. Small bilateral pleural effusions with RIGHT LOWER lung consolidation versus atelectasis. Pneumonia is not excluded. Compressive atelectasis/collapse of the LEFT LOWER lobe. 3. Small to moderate ascites and diffuse subcutaneous edema. 4. Fractures of the anterior RIGHT 4th through 7th ribs, of uncertain chronicity but appear acute to subacute. Correlate clinically. 5. Electronically Signed   By: Margarette Canada M.D.   On: 12/04/2018 19:04    Anti-infectives: Anti-infectives (From admission, onward)   Start     Dose/Rate Route Frequency Ordered Stop   12/06/18  1800  vancomycin (VANCOCIN) IVPB 750 mg/150 ml premix     750 mg 150 mL/hr over 60 Minutes Intravenous Every 24 hours 12/05/18 1652     12/05/18 1800  ceFEPIme (MAXIPIME) 2 g in sodium chloride 0.9 % 100 mL IVPB     2 g 200 mL/hr over 30 Minutes Intravenous Every 12 hours 12/05/18 1644     12/05/18 1700  vancomycin (VANCOCIN) 1,250 mg in sodium chloride 0.9 % 250 mL IVPB     1,250 mg 166.7 mL/hr over 90 Minutes Intravenous  Once 12/05/18 1652 12/05/18 2019   11/20/2018 1800  fluconazole (DIFLUCAN) IVPB 200 mg  Status:  Discontinued     200 mg 100 mL/hr over 60 Minutes Intravenous Every 24 hours 11/30/2018 0748 11/19/2018 0847   12/03/2018 1800  fluconazole (DIFLUCAN) IVPB 400 mg  Status:  Discontinued     400 mg 100 mL/hr over 120 Minutes Intravenous Every 24 hours 12/12/2018 0847 12/08/2018 0856   11/30/2018 1400  piperacillin-tazobactam (ZOSYN) IVPB 2.25 g  Status:  Discontinued     2.25 g 100 mL/hr over 30 Minutes Intravenous Every 6 hours 11/26/2018 0748 12/01/18 1104   11/26/2018 0600  cefoTEtan (CEFOTAN) 2 g in sodium chloride 0.9 % 100 mL IVPB     2 g 200 mL/hr over 30 Minutes Intravenous On call to O.R. 11/22/18 1300 12/02/2018 0624   11/22/18 1800  fluconazole (DIFLUCAN) IVPB 400 mg  Status:  Discontinued     400 mg 100 mL/hr over 120 Minutes Intravenous Every 24 hours 11/29/2018 1528 11/24/2018 0748   11/22/18 1800  vancomycin (VANCOCIN) 1,500 mg in sodium chloride 0.9 % 500 mL IVPB  Status:  Discontinued     1,500 mg 250 mL/hr over 120 Minutes Intravenous Every 24 hours 11/22/18 0358 11/22/18 0750   11/22/18 0115  vancomycin (VANCOCIN) 1,500 mg in sodium chloride 0.9 % 500 mL IVPB     1,500 mg 250 mL/hr over 120 Minutes Intravenous  Once 11/22/18 0101 11/22/18 0319   12/07/2018 1630  fluconazole (DIFLUCAN) IVPB 800 mg     800 mg 100 mL/hr over 240 Minutes Intravenous  Once 11/24/2018 1527 11/16/2018 2013   12/12/2018 1600  piperacillin-tazobactam (ZOSYN) IVPB 3.375 g  Status:  Discontinued     3.375  g 12.5 mL/hr over 240 Minutes Intravenous Every 8 hours 12/03/2018 1522 12/12/2018 0748   12/13/2018 0545  piperacillin-tazobactam (ZOSYN) IVPB 3.375 g     3.375 g 100 mL/hr over 30 Minutes Intravenous  Once 12/02/2018 0530 12/11/2018 0626   12/06/2018 0530  piperacillin-tazobactam (ZOSYN) IVPB 4.5 g  Status:  Discontinued     4.5 g 200 mL/hr over 30 Minutes Intravenous  Once 11/20/2018 0517 11/26/2018 0529       Assessment/Plan HTN HLD DM Hypothyroidism 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 Ninfa Linden- POD #15 2. S/pReexploration of recent laparotomyWITH [facility] Dr. Rosendo Gros- POD #13 3. S/pExploratory laparotomy with washout of abdomen and placement of vacuum VAC dressing6/11 Dr. Brantley Stage- POD #11 4. Exploratory laparotomy with anastomosis of SB and closure of abdominal wall 6/14 Dr. Brantley Stage- POD #8 -will try trickle TFs today as patient has BS and had a BM overnight with minimal NGT output. - Cont TNA for now.  LFTs downtrending -Cont BID NSWD dressing changes to midline wound - WBC down today slightly to 24K. CT C/A/P show no major signs of infection. Does have some broken ribs ID -diflucan 6/7-6/11, vancomycin 6/8-6/8.zosyn 6/7>>(day 14)  FEN -IVF, NGT with trickle TFs.TNA  VTE -SCDs, heparin Foley -in place Contact - brother GeorgeMattier([phone] or [phone])      - pt is full code   LOS: 15 days    [name] , [facility] 12/06/2018, 8:12 AM Pager: [phone]

## 2018-12-06 NOTE — Progress Notes (Signed)
Addendum: Updated patient's brother  NAME:  Carmen Stone, MRN:  [id number], DOB:  April 10, 1956, LOS: 21 ADMISSION DATE:  12/12/2018, CONSULTATION DATE:  11/21/2018 REFERRING MD:  Dr. Ninfa Linden, Surgery, CHIEF COMPLAINT:  Abdominal pain   Brief History   63 y/o female presented to ER with nausea, vomiting, abdominal pain.  CT abd/pelvis showed focal perforated loop of SB with free air with multiple complex ventral hernias.  Had emergent laparotomy with SB resection, lysis of adhesions and wound vac.  Remained on vent and pressors post op.  Hospital course complicated by respiratory arrest 6/13, multiple reintubation's, intermittent vasopressor needs.    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 Changing volume goal on CRRT to -100 cc an hour. Weaned 7 hours PSV   6/16 Encephalopathic. Hypertensive, adding labetalol.  IV TNA started 6/17 A little more awake.  Tolerating PSV but SBI exceeds 105.  Volume removal with CVVHD 6/18 Neuro status continues to improve.  Hemodynamics stable however did require brief vasoactive drips during nighttime.  She is now at an net negative fluid volume status.  Likely nearing euvolemic state.  PSV 6/19 Extubated 6/20 reintubated for altered mentation, head CT negative  6/21 Tx PRBC x1   Consults:  Renal 6/09 AKI, acidosis Cardiology 6/09 Takotsubo CM   Procedures:  ETT 6/07 >> 6/19, 6/20 >> Rt IJ HD 6/09 >> Rt  CVL 6/09 >> Lt radial aline 6/09 >>  Significant Diagnostic Tests:  CT abd/pelvis 6/07 >> focal perforated loop of SB with  free air with multiple complex ventral hernias Echo 6/08 >> EF 20%, Takotsubo CM CT head 6/20 >> negative for any acute findings CT abdomen 6/20 >> bilateral effusions, atelectasis left lower lobe, results noted.    CT Chest 6/20 >> small bilateral pleural effusions with compressive atelectasis, anterior R 4-7 rib fractures CT ABD/Pelvis 6/20 >> small bowel surgical changes, mild circumferential wall thickening of scattered small bowel loops, small to moderate ascites  Micro Data:  COVID 6/07 >> negative Tracheal aspirate 6/20 >>   Antimicrobials:  Zosyn 6/07 >> 6/17 Diflucan 6/07 >> 6/10 Cefepime 6/21 >> Vanco 6/21 >>  Interim history/subjective:  Afebrile.  Net net 2.2L overall. RN reports waxing / waning of mental status (unchanged), intermittently follows commands appropriately.  Off vasopressors.  No acute events overnight.   Objective   Blood pressure (!) 182/72, pulse (!) 104, temperature (!) 97.5 F (36.4 C), temperature source Oral, resp. rate 15, height 5\' 4"  (1.626 m), weight 76.9 kg, SpO2 100 %. CVP:  [2 mmHg-6 mmHg] 6 mmHg  Vent Mode: PRVC FiO2 (%):  [30 %] 30 % Set Rate:  [18 bmp] 18 bmp Vt Set:  [430 mL-500 mL] 430 mL PEEP:  [5 cmH20] 5 cmH20 Pressure Support:  [8 cmH20] 8 cmH20 Plateau Pressure:  [18 cmH20-27 cmH20] 27 cmH20   Intake/Output Summary (Last 24 hours) at 12/06/2018 0821 Last data filed at 12/06/2018 0800 Gross per 24 hour  Intake 3398.07 ml  Output 2668 ml  Net 730.07 ml   Filed Weights   12/03/18 0500 12/05/18 0500 12/06/18 0231  Weight: 74.2 kg 75.9 kg 76.9 kg    Examination: General: adult female, appears older than age, critically ill on vent   HEENT: MM pink/moist, ETT, R IJ HD cath, R [place] TLC Neuro: eyes closed, no follow commands, pupils 67mm=/reactive  CV: s1s2 rrr, no m/r/g PULM: even/non-labored, lungs bilaterally with rhonchi bilaterally  ZO:XWRU, non-tender, bsx4 active  Extremities: warm/dry, BUE 2+ pitting edema  Skin: no  rashes or lesions  Resolved Hospital Problem list   Metabolic alkalosis Abdominal peritonitis with septic shock Circulatory shock, mix of cardiogenic and sepsis off pressors as of 6/15 Cardiorespiratory arrest 6/13 Shock liver Acute metabolic encephalopathy Hypophosphatemia  Assessment & Plan:   Acute Metabolic Encephalopathy -Head CT negative, similar unexplained events during admit  -question narcotic effect based on med review and timing of CT imaging  P: Follow serial neuro exams  Minimize sedating medications   Small Bowel Perforation s/p Resection, Lysis of Adhesions -reexploration 6/14, creation of functional end to end small bowel anastomosis and closure of abdominal wound P: Continue TPN per pharmacy  Post operative / wound care per CCS  Anemia P: Trend CBC   Acute Hypoxic Respiratory Failure in setting of Peritonitis P: PRVC 8cc/kg  Wean PEEP / FiO2 for sats >90% Daily PSV with WUA as tolerated Follow intermittent CXR  Atelectasis / Pleural Effusion -likely related to complicated abdominal process  P: Follow CXR intermittently  Cultures as above  ABX as above, empiric coverage for pulmonary source   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%, Takotsubo Cardiomyopathy P: Hold home lisinopril, pravachol  PRN hydralazine  Consider repeat ECHO in July  ICU monitoring   Acute kidney injury from tubular necrosis -baseline creatinine of 0.73 from 12/04/2018 P: Trend BMP / urinary output Replace electrolytes as indicated Avoid nephrotoxic agents, ensure adequate renal perfusion Appreciate Nephrology input  [facility] for [facility] HD   Type 2 Diabetes P: SSI   Thrombocytopenia from sepsis P: Follow CBC  Monitor for bleeding  Consider restart of SQ Heparin 6/23   Moderate Protein Calorie Malnutrition  P: TPN per pharmacy   Difficult IV Access P: Will likely need Perm Cath placement, hold for now / await renal recovery with CVVHD  break Difficult situation as she may need long term HD / will need to avoid PICC line  ? If IR could place alternative line in  L IJ for IV access   Best practice:  Diet: TPN DVT prophylaxis: SCDs, hold heparin 6/22 GI prophylaxis: Protonix Mobility: Bedrest Code Status: Full code Disposition: ICU  Labs:   CMP Latest Ref Rng & Units 12/06/2018 12/06/2018 12/05/2018  Glucose 70 - 99 mg/dL 103(H) 105(H) 164(H)  BUN 8 - 23 mg/dL 44(H) 47(H) 53(H)  Creatinine 0.44 - 1.00 mg/dL 1.03(H) 1.09(H) 1.25(H)  Sodium 135 - 145 mmol/L 134(L) 135 131(L)  Potassium 3.5 - 5.1 mmol/L 4.1 4.1 4.2  Chloride 98 - 111 mmol/L 101 101 98  CO2 22 - 32 mmol/L 24 25 22   Calcium 8.9 - 10.3 mg/dL 7.5(L) 7.6(L) 7.5(L)  Total Protein 6.5 - 8.1 g/dL - 5.8(L) -  Total Bilirubin 0.3 - 1.2 mg/dL - 1.2 -  Alkaline Phos 38 - 126 U/L - 285(H) -  AST 15 - 41 U/L - 32 -  ALT 0 - 44 U/L - 53(H) -   CBC Latest Ref Rng & Units 12/06/2018 12/05/2018 12/05/2018  WBC 4.0 - 10.5 K/uL 24.0(H) - 25.4(H)  Hemoglobin 12.0 - 15.0 g/dL 7.5(L) 8.0(L) 6.5(LL)  Hematocrit 36.0 - 46.0 % 23.0(L) 24.2(L) 20.0(L)  Platelets 150 - 400 K/uL 85(L) - 87(L)   ABG    Component Value Date/Time   PHART 7.445 12/04/2018 0435   PCO2ART 37.4 12/04/2018 0435   PO2ART 48.6 (L) 12/04/2018 0435   HCO3 23.4 12/05/2018 1709   TCO2 16 (L) 11/26/2018 1658   ACIDBASEDEF 0.8 12/04/2018 0215   O2SAT 82.4 12/05/2018 1709   CBG (last 3)  Recent Labs    12/06/18 0013 12/06/18 0236 12/06/18 0740  GLUCAP 149* 77 90    CC Time: 35 minutes   [name], NP-C [facility] Pgr: [phone] or if no answer [phone] 12/06/2018, 8:21 AM

## 2018-12-07 ENCOUNTER — Inpatient Hospital Stay (HOSPITAL_COMMUNITY): Payer: BLUE CROSS/BLUE SHIELD

## 2018-12-07 DIAGNOSIS — G9341 Metabolic encephalopathy: Secondary | ICD-10-CM

## 2018-12-07 LAB — GLUCOSE, CAPILLARY
Glucose-Capillary: 130 mg/dL — ABNORMAL HIGH (ref 70–99)
Glucose-Capillary: 134 mg/dL — ABNORMAL HIGH (ref 70–99)
Glucose-Capillary: 140 mg/dL — ABNORMAL HIGH (ref 70–99)
Glucose-Capillary: 146 mg/dL — ABNORMAL HIGH (ref 70–99)
Glucose-Capillary: 166 mg/dL — ABNORMAL HIGH (ref 70–99)
Glucose-Capillary: 171 mg/dL — ABNORMAL HIGH (ref 70–99)

## 2018-12-07 LAB — BASIC METABOLIC PANEL
Anion gap: 13 (ref 5–15)
BUN: 90 mg/dL — ABNORMAL HIGH (ref 8–23)
CO2: 21 mmol/L — ABNORMAL LOW (ref 22–32)
Calcium: 7.7 mg/dL — ABNORMAL LOW (ref 8.9–10.3)
Chloride: 98 mmol/L (ref 98–111)
Creatinine, Ser: 2.4 mg/dL — ABNORMAL HIGH (ref 0.44–1.00)
GFR calc Af Amer: 24 mL/min — ABNORMAL LOW (ref >60)
GFR calc non Af Amer: 21 mL/min — ABNORMAL LOW (ref >60)
Glucose, Bld: 130 mg/dL — ABNORMAL HIGH (ref 70–99)
Potassium: 4.6 mmol/L (ref 3.5–5.1)
Sodium: 132 mmol/L — ABNORMAL LOW (ref 135–145)

## 2018-12-07 LAB — RENAL FUNCTION PANEL
Albumin: 1.6 g/dL — ABNORMAL LOW (ref 3.5–5.0)
Anion gap: 14 (ref 5–15)
BUN: 87 mg/dL — ABNORMAL HIGH (ref 8–23)
CO2: 21 mmol/L — ABNORMAL LOW (ref 22–32)
Calcium: 7.6 mg/dL — ABNORMAL LOW (ref 8.9–10.3)
Chloride: 99 mmol/L (ref 98–111)
Creatinine, Ser: 2.46 mg/dL — ABNORMAL HIGH (ref 0.44–1.00)
GFR calc Af Amer: 23 mL/min — ABNORMAL LOW (ref >60)
GFR calc non Af Amer: 20 mL/min — ABNORMAL LOW (ref >60)
Glucose, Bld: 128 mg/dL — ABNORMAL HIGH (ref 70–99)
Phosphorus: 6.2 mg/dL — ABNORMAL HIGH (ref 2.5–4.6)
Potassium: 4.8 mmol/L (ref 3.5–5.1)
Sodium: 134 mmol/L — ABNORMAL LOW (ref 135–145)

## 2018-12-07 LAB — CBC
HCT: 19.7 % — ABNORMAL LOW (ref 36.0–46.0)
Hemoglobin: 6.6 g/dL — CL (ref 12.0–15.0)
MCH: 30.7 pg (ref 26.0–34.0)
MCHC: 33.5 g/dL (ref 30.0–36.0)
MCV: 91.6 fL (ref 80.0–100.0)
Platelets: 109 10*3/uL — ABNORMAL LOW (ref 150–400)
RBC: 2.15 MIL/uL — ABNORMAL LOW (ref 3.87–5.11)
RDW: 18.1 % — ABNORMAL HIGH (ref 11.5–15.5)
WBC: 17.3 10*3/uL — ABNORMAL HIGH (ref 4.0–10.5)
nRBC: 0 % (ref 0.0–0.2)

## 2018-12-07 LAB — PREPARE RBC (CROSSMATCH)

## 2018-12-07 LAB — CULTURE, RESPIRATORY W GRAM STAIN

## 2018-12-07 LAB — PHOSPHORUS: Phosphorus: 5.9 mg/dL — ABNORMAL HIGH (ref 2.5–4.6)

## 2018-12-07 LAB — MAGNESIUM: Magnesium: 2.3 mg/dL (ref 1.7–2.4)

## 2018-12-07 LAB — HEMOGLOBIN AND HEMATOCRIT, BLOOD
HCT: 25 % — ABNORMAL LOW (ref 36.0–46.0)
Hemoglobin: 8.4 g/dL — ABNORMAL LOW (ref 12.0–15.0)

## 2018-12-07 MED ORDER — ALTEPLASE 2 MG IJ SOLR
2.0000 mg | Freq: Once | INTRAMUSCULAR | Status: AC
  Administered 2018-12-07: 2 mg

## 2018-12-07 MED ORDER — HEPARIN SODIUM (PORCINE) 5000 UNIT/ML IJ SOLN: 5000.0000 [IU] | Freq: Three times a day (TID) | INTRAMUSCULAR | Status: DC

## 2018-12-07 MED ORDER — PRISMASOL BGK 4/2.5 32-4-2.5 MEQ/L REPLACEMENT SOLN
Status: DC
  Administered 2018-12-07 – 2018-12-18 (×11): via INTRAVENOUS_CENTRAL
  Filled 2018-12-07 (×17): qty 5000

## 2018-12-07 MED ORDER — PRISMASOL BGK 4/2.5 32-4-2.5 MEQ/L IV SOLN
INTRAVENOUS | Status: DC
  Administered 2018-12-07 – 2018-12-19 (×56): via INTRAVENOUS_CENTRAL
  Filled 2018-12-07 (×107): qty 5000

## 2018-12-07 MED ORDER — SODIUM CHLORIDE 0.9 % IV SOLN
2.0000 g | Freq: Two times a day (BID) | INTRAVENOUS | Status: DC
  Administered 2018-12-07 – 2018-12-09 (×5): 2 g via INTRAVENOUS
  Filled 2018-12-07 (×6): qty 2

## 2018-12-07 MED ORDER — VANCOMYCIN HCL IN DEXTROSE 750-5 MG/150ML-% IV SOLN: 750.0000 mg | INTRAVENOUS | Status: DC

## 2018-12-07 MED ORDER — SODIUM CHLORIDE 0.9 % FOR CRRT
INTRAVENOUS_CENTRAL | Status: DC | PRN
  Filled 2018-12-07: qty 1000

## 2018-12-07 MED ORDER — STERILE WATER FOR INJECTION IV SOLN
INTRAVENOUS | Status: AC
  Administered 2018-12-07: 18:00:00 via INTRAVENOUS
  Filled 2018-12-07: qty 1200

## 2018-12-07 MED ORDER — ALTEPLASE 2 MG IJ SOLR
2.0000 mg | Freq: Once | INTRAMUSCULAR | Status: DC | PRN
  Filled 2018-12-07: qty 2

## 2018-12-07 MED ORDER — SODIUM CHLORIDE 0.9% IV SOLUTION
Freq: Once | INTRAVENOUS | Status: AC
  Administered 2018-12-07: 13:00:00 via INTRAVENOUS

## 2018-12-07 MED ORDER — SODIUM CHLORIDE 0.9 % IV SOLN: 2.0000 g | INTRAVENOUS | Status: DC

## 2018-12-07 MED ORDER — HEPARIN SODIUM (PORCINE) 1000 UNIT/ML DIALYSIS
1000.0000 [IU] | INTRAMUSCULAR | Status: DC | PRN
  Filled 2018-12-07: qty 3
  Filled 2018-12-07: qty 6

## 2018-12-07 MED ORDER — PRISMASOL BGK 4/2.5 32-4-2.5 MEQ/L REPLACEMENT SOLN
Status: DC
  Administered 2018-12-07 – 2018-12-19 (×18): via INTRAVENOUS_CENTRAL
  Filled 2018-12-07 (×31): qty 5000

## 2018-12-07 MED ORDER — VANCOMYCIN HCL IN DEXTROSE 750-5 MG/150ML-% IV SOLN
750.0000 mg | INTRAVENOUS | Status: DC
  Administered 2018-12-07 – 2018-12-08 (×2): 750 mg via INTRAVENOUS
  Filled 2018-12-07 (×3): qty 150

## 2018-12-07 NOTE — Progress Notes (Signed)
NUTRITION NOTE RD working remotely.   Secure chat message received from Pharmacist alerting RD to plan for CRRT re-start. Increase protein needs back to previous needs of 172-187 grams (2.3-2.5 grams/kg). RD will continue to follow per protocol.    Jarome Matin, MS, RD, LDN, [facility] Clinical Dietitian Pager # [phone] After hours/weekend pager # [phone]

## 2018-12-07 NOTE — Progress Notes (Signed)
PHARMACY - ADULT TOTAL PARENTERAL NUTRITION CONSULT NOTE   Pharmacy Consult for TPN Indication: prolonged ileus  Patient Measurements: Height: _0  (162.6 cm) Weight: 176 lb 2.4 oz (79.9 kg) IBW/kg (Calculated) : 54.7 TPN AdjBW (KG): 59.7 Body mass index is 30.24 kg/m. Usual Weight: 90kg  Insulin Requirements: 9 units in past 24 hours  Current Nutrition: NPO  IVF: NS @ 87m/hr  Central access: 11/20/2018 TPN start date: 11/30/18  ASSESSMENT                                                                                                          HPI: Carmen Stone admitted 6/7 with perforated small bowel from closed-loop obstruction and underwent emergent exploratory laparotomy. Returned to OR on 6/9 and again on 6/14 for closure of abdominal wall.  Patient coded on 62024/07/03and developed multiorgan failure. Remains intubated & on CRRT. Pressors have been weaned off and she is completing antibiotic course for sepsis/PNA.  NPO since admission on 6/7. Pharmacy consulted to start TPN 6/16. D/t acute liver injury from shock, initiated TPN at low rate & advance slowly as requested by surgery.  Significant events:  6/18: OK to increase to goal rate per surgery 6/22: CRRT stopped for holiday; CBG 69 overnight, D50 given but TPN continued (55 units insulin in TPN); BS and BM overnight - initiating trickle feeds 6/23: resuming CRRT, return to high-protein TPN; fevers overnight, CCM removing some central lines; will use chemo port for TPN   Today:   Glucose (goal 100-150) - Hx DM on 70/30 Novolog 20 units BID PTA.  45 units insulin in TPN + ICU resistant scale  CBGs low 6/22 AM, better today with a few slightly elevated readings (108-167) after increasing dextrose and reducing insulin in TPN by 18%  21 units SSI since most recent TPN started  Electrolytes - Na remains borderline low, K and Phos rising d/t holding CRRT; Ca, Mg, Cl stable WNL  Renal - AKI; intrinsic renal function appears to be  poor off CRRT; BUN, SCr rising; bicarb dropping; resuming CRRT today  LFTs - 6/22: ALI improving; AST/ALT essentially back to baseline although Alk phos still elevated; TBili WNL; albumin remains low  TGs - WNL 6/22  Prealbumin - remains low but improving 6/22  NUTRITIONAL GOALS                                                                                             RD recs: (6/23 - back on CRRT) RD recs:  Kcal:1530 kcal, Protein:172-187 grams (2.3-2.5 grams/kg), Fluid:>/= 1.8 L/day  Custom TPN at goal rate of 75 ml/hr (NO LIPIDS) to provide: 180 g/day protein, 1515 Kcal/day.  PLAN  At 1800 today:  Continue TPN at 75 ml/hr; adjusted protein back up per RD recs  Electrolytes: no changes from yesterday; Cl:Ac ratio = 1:2 (will not change lytes drastically as derangements and acidosis should improve w/ CRRT)  Would normally start IV lipids today (day 8 TPN) with critically ill ICU patient, but given new fever overnight and risk of complicating potential line infection, will hold off on lipids again today. Should be able to resume if BCx clear x 24 hr (CCM thinks lungs most likely source)  TPN to contain standard multivitamins, trace elements MWF only due to national backorder.  IVF per MD  Continue resistant ICU glycemic control orders q4h; continue 45 units insulin in TPN  TPN lab panels Mondays & Thursdays  Bmet, Mg, Phos tomorrow   Reuel Boom, PharmD, BCPS [phone] 12/07/2018, 8:25 AM

## 2018-12-07 NOTE — Progress Notes (Signed)
Purple port on HD cath and blue port on right [place] are clotted, will not pull central line tonight will follow up in the morning per E-Link, MD

## 2018-12-07 NOTE — Progress Notes (Signed)
Addendum: Updated patient's brother  NAME:  Carmen Stone, MRN:  [id number], DOB:  22-Jan-1956, LOS: 42 ADMISSION DATE:  12/12/2018, CONSULTATION DATE:  11/21/2018 REFERRING MD:  Dr. Ninfa Linden, Surgery, CHIEF COMPLAINT:  Abdominal pain   Brief History   63 y/o female presented to ER with nausea, vomiting, abdominal pain.  CT abd/pelvis showed focal perforated loop of SB with free air with multiple complex ventral hernias.  Had emergent laparotomy with SB resection, lysis of adhesions and wound vac.  Remained on vent and pressors post op.  Hospital course complicated by respiratory arrest 6/13, multiple reintubation's, intermittent vasopressor needs.    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 Changing volume goal on CRRT to -100 cc an hour. Weaned 7 hours PSV   6/16 Encephalopathic. Hypertensive, adding labetalol.  IV TNA started 6/17 A little more awake.  Tolerating PSV but SBI exceeds 105.  Volume removal with CVVHD 6/18 Neuro status continues to improve.  Hemodynamics stable however did require brief vasoactive drips during nighttime.  She is now at an net negative fluid volume status.  Likely nearing euvolemic state.  PSV 6/19 Extubated 6/20 reintubated for altered mentation, head CT negative  6/21 Tx PRBC x1.  Off vasopressors 6/23: Febrile overnight, Blood Cx. Transfuse 1 U PRBC  Consults:  Renal 6/09 AKI, acidosis Cardiology 6/09 Takotsubo CM   Procedures:  ETT 6/07 >> 6/19, 6/20 >> Rt IJ HD 6/09 >> Rt [place] CVL 6/09 >> Lt radial aline 6/09 >>Discontinued   Significant Diagnostic Tests:  CT abd/pelvis 6/07 >> focal perforated loop of SB with free air with multiple complex ventral hernias Echo 6/08 >> EF 20%, Takotsubo CM CT head 6/20 >> negative for any acute findings CT abdomen 6/20 >> bilateral effusions, atelectasis left lower lobe, results noted.    CT Chest 6/20 >> small bilateral pleural effusions with compressive atelectasis, anterior R 4-7 rib fractures CT ABD/Pelvis 6/20 >> small bowel surgical changes, mild circumferential wall thickening of scattered small bowel loops, small to moderate ascites  Micro Data:  COVID 6/07 >> negative Tracheal aspirate 6/20 >> E Coli Blood Cx 6/23 >>  Antimicrobials:  Zosyn 6/07 >> 6/17 Diflucan 6/07 >> 6/10 Cefepime 6/21 >> Vanco 6/21 >>  Interim history/subjective:  Fever overnight 101.5 Intermittent tachycardia with HR < 130 overnight 75 ml urine made overnight without CRRT Waxing and waning neurological exam, patient would not follow commands or open eyes on initial exam, on repeat exam with nurse she would open eyes to voice and wiggle toes to command.   Objective   Blood pressure (!) 137/45, pulse (!) 113, temperature (!) 100.7 F (38.2 C), temperature source Oral, resp. rate (!) 34, height 5\' 4"  (1.626 m), weight 79.9 kg, SpO2 99 %. CVP:  [14 mmHg] 14 mmHg  Vent Mode: PRVC FiO2 (%):  [30 %] 30 % Set Rate:  [18 bmp] 18 bmp Vt Set:  [430 mL] 430 mL PEEP:  [5 cmH20] 5 cmH20 Pressure Support:  [12 cmH20] 12 cmH20 Plateau Pressure:  [22 cmH20-27 cmH20] 27 cmH20   Intake/Output Summary (Last 24 hours) at  12/07/2018 1007 Last data filed at 12/07/2018 0800 Gross per 24 hour  Intake 2167.69 ml  Output 160 ml  Net 2007.69 ml   Filed Weights   12/05/18 0500 12/06/18 0231 12/07/18 0334  Weight: 75.9 kg 76.9 kg 79.9 kg    Examination: General: critically ill on vent without sedation Neuro: Opens eyes to voice, wiggles toes on command, PERRL 3 mm  CV: s1s2 rrr, no m/r/g, warm and well  perfused PULM: non-labored, lungs bilaterally with rhonchi bilaterally, small amount of brown secretions via ETT GI: soft, NT/ND incision site clean without drainage. Extremities: warm/dry, BUE 2+ pitting edema  Skin: no rashes or lesions  Resolved Hospital Problem list   Metabolic alkalosis Abdominal peritonitis with septic shock Circulatory shock, mix of cardiogenic and sepsis off pressors as of 6/15 Cardiorespiratory arrest 6/13 Shock liver Acute metabolic encephalopathy Hypophosphatemia  Assessment & Plan:   Acute Metabolic Encephalopathy -Head CT negative, similar unexplained events during admit  -question narcotic effect based on med review and timing of CT imaging  -Mild improvement in neurological exam today, following commands and opening eyes P: Follow serial neuro exams and minimize sedation.   Fever: -T max 101.5 6/23 -Resp Cx: E Coli, pending susceptibilities -No diarrhea -Abdominal exam with clean incision site, no drainage P: -Fever despite appropriate abx for E Coli from respiratory culture.  Will add Blood Cx and continue broad spectrum abx with Vanc/Cefepime. -d/c CVC.  Unfortunately on CRRT and unable to discontinue HD catheter at this time.  If blood cx negative would request Permcath placement for removal of R IJ HD catheter. -Foley x 16 days per nursing, will change out.  Nephrology requesting to keep foley to monitor for renal recovery.  Small Bowel Perforation s/p Resection, Lysis of Adhesions -reexploration 6/14, creation of functional end to end small bowel anastomosis and closure of abdominal wound P: Continue TPN & trickle feeds  Anemia No active bleeding on exam P: Trend CBC, transfuse if Hgb < 7.  Transfuse 1 unit PRBC today for hgb 6.5  Acute Hypoxic Respiratory Failure  P: Continue full vent support with lung protective strategies ( 8 cc/kg Vt's). Neurological exam and failed SBT due to tachypnea and secretions prevent extubation.    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 permcath placement in near future   Best practice:  Diet: TPN DVT prophylaxis: SCDs, hold subq Heparin today with requiring PRBC transfusion GI prophylaxis: Protonix Mobility: Bedrest Code Status: Full code Disposition: ICU  Labs:   CMP Latest Ref Rng & Units 12/07/2018 12/06/2018 12/06/2018  Glucose 70 - 99 mg/dL 130(H) 103(H) 105(H)  BUN 8 - 23 mg/dL 90(H) 44(H) 47(H)  Creatinine 0.44 - 1.00 mg/dL 2.40(H) 1.03(H) 1.09(H)  Sodium 135 - 145 mmol/L 132(L) 134(L) 135  Potassium 3.5 - 5.1 mmol/L 4.6 4.1 4.1  Chloride 98 - 111 mmol/L 98 101 101  CO2 22 - 32 mmol/L 21(L) 24 25  Calcium 8.9 - 10.3 mg/dL 7.7(L) 7.5(L) 7.6(L)  Total Protein 6.5 - 8.1 g/dL - - 5.8(L)  Total Bilirubin 0.3 - 1.2 mg/dL - - 1.2  Alkaline Phos 38 - 126 U/L - - 285(H)  AST 15 - 41 U/L - - 32  ALT 0 - 44 U/L - - 53(H)   CBC Latest Ref Rng & Units 12/06/2018 12/05/2018 12/05/2018  WBC 4.0 - 10.5 K/uL 24.0(H) - 25.4(H)  Hemoglobin 12.0 - 15.0 g/dL 7.5(L) 8.0(L) 6.5(LL)  Hematocrit 36.0 - 46.0 % 23.0(L) 24.2(L) 20.0(L)  Platelets 150 - 400 K/uL 85(L) - 87(L)   ABG    Component Value Date/Time   PHART 7.445 12/04/2018 0435   PCO2ART 37.4 12/04/2018 0435   PO2ART  48.6 (L) 12/04/2018 0435   HCO3 23.4 12/05/2018 1709   TCO2 16 (L) 11/30/2018 1658   ACIDBASEDEF 0.8 12/04/2018 0215   O2SAT 82.4 12/05/2018 1709   CBG (last 3)  Recent Labs    12/07/18 0015 12/07/18 0329 12/07/18 Eva, ACNP [facility]  Pager: [phone]

## 2018-12-07 NOTE — Progress Notes (Signed)
Patient ID: Carmen Stone, female   DOB: 1956-02-22, 63 y.o.   MRN: [id number]    9 Days Post-Op  Subjective: Open eyes to her name.  Shakes head no to abdominal pain.  Ran some fevers overnight.  Has brown secretions being suctioned from her ETT.  Still not really making any urine.  Objective: Vital signs in last 24 hours: Temp:  [98.3 F (36.8 C)-101.5 F (38.6 C)] 100.7 F (38.2 C) (06/23 0800) Pulse Rate:  [93-134] 113 (06/23 0812) Resp:  [24-38] 34 (06/23 0812) BP: (106-152)/(43-68) 137/45 (06/23 0812) SpO2:  [98 %-100 %] 99 % (06/23 0800) FiO2 (%):  [30 %] 30 % (06/23 0812) Weight:  [79.9 kg] 79.9 kg (06/23 0334) Last BM Date: 12/06/18  Intake/Output from previous day: 06/22 0701 - 06/23 0700 In: 2157.8 [I.V.:1742.3; NG/GT:258; IV Piggyback:157.6] Out: 266 [Urine:75; Emesis/NG output:100] Intake/Output this shift: Total I/O In: 180 [I.V.:150; NG/GT:30] Out: -   PE: Gen: critically ill Heart: tachy, but regular Lungs: diffuse coarse rhonchi throughout all fields Abd: soft, slightly more bloated today, less BS, midline wound is stable with necrotic adipose tissue but no fibrin.    Lab Results:  Recent Labs    12/05/18 0724 12/05/18 1405 12/06/18 0228  WBC 25.4*  --  24.0*  HGB 6.5* 8.0* 7.5*  HCT 20.0* 24.2* 23.0*  PLT 87*  --  85*   BMET Recent Labs    12/06/18 0228 12/07/18 0330  NA 134*  135 132*  K 4.1  4.1 4.6  CL 101  101 98  CO2 24  25 21*  GLUCOSE 103*  105* 130*  BUN 44*  47* 90*  CREATININE 1.03*  1.09* 2.40*  CALCIUM 7.5*  7.6* 7.7*   PT/INR No results for input(s): LABPROT, INR in the last 72 hours. CMP     Component Value Date/Time   NA 132 (L) 12/07/2018 0330   NA 138 06/01/2017 0859   K 4.6 12/07/2018 0330   K 4.2 06/01/2017 0859   CL 98 12/07/2018 0330   CO2 21 (L) 12/07/2018 0330   CO2 24 06/01/2017 0859   GLUCOSE 130 (H) 12/07/2018 0330   GLUCOSE 118 06/01/2017 0859   BUN 90 (H) 12/07/2018 0330   BUN 15.2  06/01/2017 0859   CREATININE 2.40 (H) 12/07/2018 0330   CREATININE 0.79 06/02/2018 0856   CREATININE 0.7 06/01/2017 0859   CALCIUM 7.7 (L) 12/07/2018 0330   CALCIUM 9.0 06/01/2017 0859   PROT 5.8 (L) 12/06/2018 0228   PROT 7.0 06/01/2017 0859   ALBUMIN 1.9 (L) 12/06/2018 0228   ALBUMIN 2.0 (L) 12/06/2018 0228   ALBUMIN 3.7 06/01/2017 0859   AST 32 12/06/2018 0228   AST 13 (L) 06/02/2018 0856   AST 14 06/01/2017 0859   ALT 53 (H) 12/06/2018 0228   ALT 16 06/02/2018 0856   ALT 12 06/01/2017 0859   ALKPHOS 285 (H) 12/06/2018 0228   ALKPHOS 97 06/01/2017 0859   BILITOT 1.2 12/06/2018 0228   BILITOT 0.3 06/02/2018 0856   BILITOT 0.22 06/01/2017 0859   GFRNONAA 21 (L) 12/07/2018 0330   GFRNONAA >60 06/02/2018 0856   GFRAA 24 (L) 12/07/2018 0330   GFRAA >60 06/02/2018 0856   Lipase     Component Value Date/Time   LIPASE 24 12/06/2018 0355       Studies/Results: No results found.  Anti-infectives: Anti-infectives (From admission, onward)   Start     Dose/Rate Route Frequency Ordered Stop   12/08/18 1800  vancomycin (  VANCOCIN) IVPB 750 mg/150 ml premix     750 mg 150 mL/hr over 60 Minutes Intravenous Every 48 hours 12/07/18 0822     12/08/18 1000  ceFEPIme (MAXIPIME) 2 g in sodium chloride 0.9 % 100 mL IVPB     2 g 200 mL/hr over 30 Minutes Intravenous Every 24 hours 12/07/18 0822     12/06/18 1800  vancomycin (VANCOCIN) IVPB 750 mg/150 ml premix  Status:  Discontinued     750 mg 150 mL/hr over 60 Minutes Intravenous Every 24 hours 12/05/18 1652 12/07/18 0822   12/05/18 1800  ceFEPIme (MAXIPIME) 2 g in sodium chloride 0.9 % 100 mL IVPB  Status:  Discontinued     2 g 200 mL/hr over 30 Minutes Intravenous Every 12 hours 12/05/18 1644 12/07/18 0822   12/05/18 1700  vancomycin (VANCOCIN) 1,250 mg in sodium chloride 0.9 % 250 mL IVPB     1,250 mg 166.7 mL/hr over 90 Minutes Intravenous  Once 12/05/18 1652 12/05/18 2019   12/02/2018 1800  fluconazole (DIFLUCAN) IVPB 200 mg   Status:  Discontinued     200 mg 100 mL/hr over 60 Minutes Intravenous Every 24 hours 12/12/2018 0748 12/06/2018 0847   12/02/2018 1800  fluconazole (DIFLUCAN) IVPB 400 mg  Status:  Discontinued     400 mg 100 mL/hr over 120 Minutes Intravenous Every 24 hours 11/22/2018 0847 11/22/2018 0856   11/29/2018 1400  piperacillin-tazobactam (ZOSYN) IVPB 2.25 g  Status:  Discontinued     2.25 g 100 mL/hr over 30 Minutes Intravenous Every 6 hours 11/22/2018 0748 12/01/18 1104   11/18/2018 0600  cefoTEtan (CEFOTAN) 2 g in sodium chloride 0.9 % 100 mL IVPB     2 g 200 mL/hr over 30 Minutes Intravenous On call to O.R. 11/22/18 1300 11/24/2018 0624   11/22/18 1800  fluconazole (DIFLUCAN) IVPB 400 mg  Status:  Discontinued     400 mg 100 mL/hr over 120 Minutes Intravenous Every 24 hours 11/17/2018 1528 12/08/2018 0748   11/22/18 1800  vancomycin (VANCOCIN) 1,500 mg in sodium chloride 0.9 % 500 mL IVPB  Status:  Discontinued     1,500 mg 250 mL/hr over 120 Minutes Intravenous Every 24 hours 11/22/18 0358 11/22/18 0750   11/22/18 0115  vancomycin (VANCOCIN) 1,500 mg in sodium chloride 0.9 % 500 mL IVPB     1,500 mg 250 mL/hr over 120 Minutes Intravenous  Once 11/22/18 0101 11/22/18 0319   11/22/2018 1630  fluconazole (DIFLUCAN) IVPB 800 mg     800 mg 100 mL/hr over 240 Minutes Intravenous  Once 12/12/2018 1527 12/07/2018 2013   11/30/2018 1600  piperacillin-tazobactam (ZOSYN) IVPB 3.375 g  Status:  Discontinued     3.375 g 12.5 mL/hr over 240 Minutes Intravenous Every 8 hours 12/08/2018 1522 11/19/2018 0748   11/24/2018 0545  piperacillin-tazobactam (ZOSYN) IVPB 3.375 g     3.375 g 100 mL/hr over 30 Minutes Intravenous  Once 11/17/2018 0530 11/24/2018 0626   11/29/2018 0530  piperacillin-tazobactam (ZOSYN) IVPB 4.5 g  Status:  Discontinued     4.5 g 200 mL/hr over 30 Minutes Intravenous  Once 12/02/2018 0517 12/03/2018 0529       Assessment/Plan HTN HLD DM Hypothyroidism 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 Ninfa Linden- POD #16 2. S/pReexploration of recent laparotomyWITH [facility] OUT (N/A)6/9 Dr. Rosendo Gros- POD #14 3. S/pExploratory laparotomy with washout of abdomen and placement of vacuum VAC dressing6/11 Dr. Brantley Stage- POD #12 4. Exploratory laparotomy with anastomosis of SB and closure of abdominal wall 6/14 Dr. Brantley Stage- POD #9 -cont TFs at trickle rate only for now.  Await further bowel function - Cont TNA for now.  LFTs downtrending -Cont BID NSWD dressing changes to midline wound - WBCdown today slightly to 24K. CT C/A/P show no major signs of infection.Does have some broken ribs ID -diflucan 6/7-6/11, vancomycin 6/8-6/8.zosyn 6/7>>6/21, Cefepime 6/21 --> FEN -IVF, NGT with trickle TFs.TNA  VTE -SCDs, heparin Foley -in place Contact - brother GeorgeMattier([phone] or [phone]) - pt is full code   LOS: 16 days    [name] , [facility] Surgery 12/07/2018, 8:40 AM Pager: [phone]

## 2018-12-07 NOTE — Progress Notes (Signed)
[facility] Progress Note   Subjective:    Remains off pressors.  I/O =1.8L, wt's up 2kg and CVP up 14. 75 cc UOP yest   Objective:   BP (!) 137/45   Pulse (!) 113   Temp (!) 100.7 F (38.2 C) (Oral)   Resp (!) 34   Ht '5\' 4"'  (1.626 m)   Wt 79.9 kg   SpO2 99%   BMI 30.24 kg/m   Intake/Output Summary (Last 24 hours) at 12/07/2018 7824 Last data filed at 12/07/2018 0800 Gross per 24 hour  Intake 2262.73 ml  Output 160 ml  Net 2102.73 ml   Weight change: 3 kg  Physical Exam: Genintubated NECK: no JVD  PULM: clear bilat CV: RRR no MRG ABD: soft, dressings c/d/i, quiet bowel sounds GUfoley cath minimal urine,  Ext: 1-2+ dependent edema Neuro: intubated   Assessment/ Plan:     1. Acute renal failure: last known baseline 0.79 05/2018.  She has acute oliguric AKI in the setting of septic shock due to perf bowel likely 2/2/ ATN. SP CRRT 6/9 - 6/12, held after PEA arrest.  Resumed 6/14 after abdominal closure.  No heparin.  Held CRRT yesterday, today CVP's up and wt's rising given TPN-dependence. Won't do well on intermittent HD w/ volume issue, will resume CRRT. UF 50- 100 cc/hr, avoid hypotension w/ UF.     2. Septic shock: off pressors again today. s/p courses of Zosyn and fluconazole. CT C/A/P without etiology.    3. Acute hypoxic respiratory failure: reintubated 6/20, CT head negative  4. Perforated small bowel due to closed-loop SBO / septic shock- per CCM/ gen surg, s/p closure 6/14. NGT in place  5. DM: per primary  6. Takatsubo's cardiomyopathy: EF ~ 20%  7. Severe hypoalbuminemia: per primary-- on TPN  8. SP PEA arrest: on 6/13  Kelly Splinter, MD 12/06/2018, 3:16 PM    Labs: BMET Recent Labs  Lab 12/03/18 1613 12/04/18 0500 12/04/18 1601 12/05/18 0513 12/05/18 1600 12/06/18 0228 12/07/18 0330  NA 135 136 136 134* 131* 134*  135 132*  K 3.7 4.2 3.7 3.6 4.2 4.1  4.1 4.6  CL 102 103 104 102 98 101  101 98  CO2 '25 25 23 24 22 24   25 ' 21*  GLUCOSE 185* 190* 113* 127* 164* 103*  105* 130*  BUN 39* 40* 42* 42* 53* 44*  47* 90*  CREATININE 0.99 1.02* 0.98 0.94 1.25* 1.03*  1.09* 2.40*  CALCIUM 7.3* 7.6* 7.2* 7.2* 7.5* 7.5*  7.6* 7.7*  PHOS 2.6 3.2 2.5 2.4* 4.4 3.1  3.2 5.9*   CBC Recent Labs  Lab 12/01/18 0222 12/03/18 0325 12/04/18 0500 12/05/18 0724 12/05/18 1405 12/06/18 0228  WBC 23.5* 27.4* 31.7* 25.4*  --  24.0*  NEUTROABS 21.8*  --   --   --   --  21.2*  HGB 9.9* 8.0* 7.8* 6.5* 8.0* 7.5*  HCT 29.6* 24.2* 24.2* 20.0* 24.2* 23.0*  MCV 89.2 91.0 93.1 92.2  --  90.9  PLT 67* 66* 76* 87*  --  85*    Medications:    . chlorhexidine gluconate (MEDLINE KIT)  15 mL Mouth Rinse BID  . Chlorhexidine Gluconate Cloth  6 each Topical Daily  . dorzolamide-timolol  1 drop Both Eyes BID  . feeding supplement (VITAL HIGH PROTEIN)  1,000 mL Per Tube Q24H  . furosemide  40 mg Intravenous Once  . insulin aspart  3-9 Units Subcutaneous Q4H  . mouth rinse  15  mL Mouth Rinse 10 times per day  . pantoprazole (PROTONIX) IV  40 mg Intravenous Q24H

## 2018-12-08 ENCOUNTER — Inpatient Hospital Stay (HOSPITAL_COMMUNITY): Payer: BLUE CROSS/BLUE SHIELD

## 2018-12-08 DIAGNOSIS — M7989 Other specified soft tissue disorders: Secondary | ICD-10-CM

## 2018-12-08 DIAGNOSIS — R509 Fever, unspecified: Secondary | ICD-10-CM

## 2018-12-08 LAB — GLUCOSE, CAPILLARY
Glucose-Capillary: 110 mg/dL — ABNORMAL HIGH (ref 70–99)
Glucose-Capillary: 111 mg/dL — ABNORMAL HIGH (ref 70–99)
Glucose-Capillary: 120 mg/dL — ABNORMAL HIGH (ref 70–99)
Glucose-Capillary: 131 mg/dL — ABNORMAL HIGH (ref 70–99)
Glucose-Capillary: 134 mg/dL — ABNORMAL HIGH (ref 70–99)

## 2018-12-08 LAB — TYPE AND SCREEN
ABO/RH(D): O POS
Antibody Screen: NEGATIVE
Unit division: 0
Unit division: 0

## 2018-12-08 LAB — RENAL FUNCTION PANEL
Albumin: 1.6 g/dL — ABNORMAL LOW (ref 3.5–5.0)
Anion gap: 13 (ref 5–15)
BUN: 67 mg/dL — ABNORMAL HIGH (ref 8–23)
CO2: 23 mmol/L (ref 22–32)
Calcium: 7.9 mg/dL — ABNORMAL LOW (ref 8.9–10.3)
Chloride: 99 mmol/L (ref 98–111)
Creatinine, Ser: 1.61 mg/dL — ABNORMAL HIGH (ref 0.44–1.00)
GFR calc Af Amer: 39 mL/min — ABNORMAL LOW (ref >60)
GFR calc non Af Amer: 33 mL/min — ABNORMAL LOW (ref >60)
Glucose, Bld: 149 mg/dL — ABNORMAL HIGH (ref 70–99)
Phosphorus: 3.6 mg/dL (ref 2.5–4.6)
Potassium: 5.1 mmol/L (ref 3.5–5.1)
Sodium: 135 mmol/L (ref 135–145)

## 2018-12-08 LAB — BASIC METABOLIC PANEL
Anion gap: 12 (ref 5–15)
BUN: 79 mg/dL — ABNORMAL HIGH (ref 8–23)
CO2: 23 mmol/L (ref 22–32)
Calcium: 7.7 mg/dL — ABNORMAL LOW (ref 8.9–10.3)
Chloride: 99 mmol/L (ref 98–111)
Creatinine, Ser: 1.84 mg/dL — ABNORMAL HIGH (ref 0.44–1.00)
GFR calc Af Amer: 33 mL/min — ABNORMAL LOW (ref >60)
GFR calc non Af Amer: 28 mL/min — ABNORMAL LOW (ref >60)
Glucose, Bld: 124 mg/dL — ABNORMAL HIGH (ref 70–99)
Potassium: 4.8 mmol/L (ref 3.5–5.1)
Sodium: 134 mmol/L — ABNORMAL LOW (ref 135–145)

## 2018-12-08 LAB — CBC
HCT: 23.7 % — ABNORMAL LOW (ref 36.0–46.0)
Hemoglobin: 7.7 g/dL — ABNORMAL LOW (ref 12.0–15.0)
MCH: 30.1 pg (ref 26.0–34.0)
MCHC: 32.5 g/dL (ref 30.0–36.0)
MCV: 92.6 fL (ref 80.0–100.0)
Platelets: 83 10*3/uL — ABNORMAL LOW (ref 150–400)
RBC: 2.56 MIL/uL — ABNORMAL LOW (ref 3.87–5.11)
RDW: 17.3 % — ABNORMAL HIGH (ref 11.5–15.5)
WBC: 14.8 10*3/uL — ABNORMAL HIGH (ref 4.0–10.5)
nRBC: 0 % (ref 0.0–0.2)

## 2018-12-08 LAB — BPAM RBC
Blood Product Expiration Date: 202007142359
Blood Product Expiration Date: 202007202359
ISSUE DATE / TIME: 202006211023
ISSUE DATE / TIME: 202006231254
Unit Type and Rh: 5100
Unit Type and Rh: 5100

## 2018-12-08 LAB — APTT: aPTT: 39 seconds — ABNORMAL HIGH (ref 24–36)

## 2018-12-08 LAB — PHOSPHORUS: Phosphorus: 4.3 mg/dL (ref 2.5–4.6)

## 2018-12-08 LAB — MAGNESIUM: Magnesium: 2.2 mg/dL (ref 1.7–2.4)

## 2018-12-08 MED ORDER — VITAL HIGH PROTEIN PO LIQD
1000.0000 mL | ORAL | Status: DC
  Administered 2018-12-08: 1000 mL

## 2018-12-08 MED ORDER — PRO-STAT SUGAR FREE PO LIQD: 60.0000 mL | Freq: Three times a day (TID) | ORAL | Status: DC

## 2018-12-08 MED ORDER — TRACE MINERALS CR-CU-MN-SE-ZN 10-1000-500-60 MCG/ML IV SOLN
INTRAVENOUS | Status: AC
  Administered 2018-12-08: 19:00:00 via INTRAVENOUS
  Filled 2018-12-08: qty 1140

## 2018-12-08 MED ORDER — HEPARIN (PORCINE) 25000 UT/250ML-% IV SOLN: 10.0000 [IU]/kg/h | INTRAVENOUS | Status: DC

## 2018-12-08 MED ORDER — HEPARIN (PORCINE) 25000 UT/250ML-% IV SOLN
1200.0000 [IU]/h | INTRAVENOUS | Status: DC
  Administered 2018-12-08: 15:00:00 1200 [IU]/h via INTRAVENOUS
  Filled 2018-12-08: qty 250

## 2018-12-08 MED ORDER — INSULIN ASPART 100 UNIT/ML ~~LOC~~ SOLN
3.0000 [IU] | Freq: Four times a day (QID) | SUBCUTANEOUS | Status: DC
  Administered 2018-12-08 – 2018-12-14 (×8): 3 [IU] via SUBCUTANEOUS
  Administered 2018-12-14: 6 [IU] via SUBCUTANEOUS
  Administered 2018-12-14 – 2018-12-17 (×5): 3 [IU] via SUBCUTANEOUS
  Administered 2018-12-19: 6 [IU] via SUBCUTANEOUS
  Administered 2018-12-19 (×2): 3 [IU] via SUBCUTANEOUS
  Administered 2018-12-20: 6 [IU] via SUBCUTANEOUS
  Administered 2018-12-21: 3 [IU] via SUBCUTANEOUS
  Administered 2018-12-21 (×2): 6 [IU] via SUBCUTANEOUS
  Administered 2018-12-21 – 2018-12-22 (×2): 9 [IU] via SUBCUTANEOUS
  Administered 2018-12-22: 6 [IU] via SUBCUTANEOUS
  Administered 2018-12-22: 9 [IU] via SUBCUTANEOUS
  Administered 2018-12-22 – 2018-12-23 (×4): 6 [IU] via SUBCUTANEOUS
  Administered 2018-12-23 – 2018-12-24 (×2): 9 [IU] via SUBCUTANEOUS
  Administered 2018-12-24 – 2018-12-25 (×5): 6 [IU] via SUBCUTANEOUS
  Administered 2018-12-25 – 2018-12-26 (×3): 3 [IU] via SUBCUTANEOUS
  Administered 2018-12-26: 6 [IU] via SUBCUTANEOUS
  Administered 2018-12-26 – 2018-12-27 (×2): 3 [IU] via SUBCUTANEOUS
  Administered 2018-12-27: 07:00:00 6 [IU] via SUBCUTANEOUS
  Administered 2018-12-29: 9 [IU] via SUBCUTANEOUS

## 2018-12-08 NOTE — Progress Notes (Signed)
RN notified by vascular tech that patient has a DVT in the jugular vein, left subclavian vein, and left axillary vein.  Vascular tech notified MD.  MD at bedside placed order for heparin per pharmacy.  MD assessed patient for other possible access sites unfortunately unable to place a CVC in groin at this time due to MASD.  RN instructed to keep the subclavian central line in place until alternative access is possible.

## 2018-12-08 NOTE — Progress Notes (Signed)
Left upper extremity venous duplex and bilateral lower extremity venous duplex has been completed. Refer to "CV Proc" under chart review to view preliminary results.  Critical results discussed with Dr. Ander Slade and Pamala Hurry, RN.  12/08/2018 2:40 PM Maudry Mayhew, MHA, RVT, RDCS, RDMS

## 2018-12-08 NOTE — Progress Notes (Signed)
[facility] for IV heparin Indication: IJ DVT  No Known Allergies  Patient Measurements: Height: '5\' 4"'  (162.6 cm) Weight: 176 lb 2.4 oz (79.9 kg) IBW/kg (Calculated) : 54.7 Heparin Dosing Weight: 71 kg  Vital Signs: Temp: 97.9 F (36.6 C) (06/24 1400) Temp Source: Bladder (06/24 0700) BP: 127/57 (06/24 1400) Pulse Rate: 101 (06/24 1400)  Labs: Recent Labs    12/06/18 0228 12/07/18 0330 12/07/18 1021 12/07/18 1736 12/07/18 1846 12/08/18 0509  HGB 7.5*  --  6.6* 8.4*  --  7.7*  HCT 23.0*  --  19.7* 25.0*  --  23.7*  PLT 85*  --  109*  --   --  83*  APTT  --   --   --   --   --  39*  CREATININE 1.03*  1.09* 2.40*  --   --  2.46* 1.84*    Estimated Creatinine Clearance: 31.6 mL/min (A) (by C-G formula based on SCr of 1.84 mg/dL (H)).   Medical History: Past Medical History:  Diagnosis Date  . Diabetes mellitus, type II ([place])   . Hyperlipemia   . Hypertension   . 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 Radiation 06/20/14-07/28/14    Medications:  Medications Prior to Admission  Medication Sig Dispense Refill Last Dose  . atorvastatin (LIPITOR) 10 MG tablet Take 10 mg by mouth daily.     Marland Kitchen acetaminophen (TYLENOL) 325 MG tablet Take 650 mg by mouth every 6 (six) hours as needed for mild pain.     . Continuous Blood Gluc Receiver (FREESTYLE LIBRE 14 DAY READER) DEVI daily.     . Continuous Blood Gluc Sensor (FREESTYLE LIBRE 14 DAY SENSOR) MISC Place 1 Device onto the skin every 14 (fourteen) days.     Marland Kitchen glucose blood test strip Use as instructed 100 each 12   . glucose monitoring kit (FREESTYLE) monitoring kit 1 each by Does not apply route as needed for other. Check blood sugars daily. 1 each 0   . Insulin Glargine (LANTUS SOLOSTAR) 100 UNIT/ML Solostar Pen Inject 10 Units into the skin daily at 10 pm. (Patient not taking: Reported on 12/04/2018) 15 mL 3 Not Taking at Unknown time  . levothyroxine (SYNTHROID) 25 MCG tablet  Take 25 mcg by mouth daily.     Marland Kitchen lidocaine-prilocaine (EMLA) cream Apply 1 application topically as needed. (Patient not taking: Reported on 12/12/2018) 30 g 0 Not Taking at Unknown time  . LORazepam (ATIVAN) 0.5 MG tablet Take 1 tablet (0.5 mg total) by mouth at bedtime as needed (Nausea or vomiting). (Patient not taking: Reported on 12/01/2018) 30 tablet 0 Not Taking at Unknown time  . metFORMIN (GLUCOPHAGE-XR) 500 MG 24 hr tablet Take 500 mg by mouth 2 (two) times daily with a meal.     . NOVOLOG MIX 70/30 FLEXPEN (70-30) 100 UNIT/ML FlexPen Inject 20 Units into the muscle 2 (two) times daily with a meal.     . pravastatin (PRAVACHOL) 10 MG tablet Take 10 mg by mouth daily.   0    Scheduled:  . chlorhexidine gluconate (MEDLINE KIT)  15 mL Mouth Rinse BID  . Chlorhexidine Gluconate Cloth  6 each Topical Daily  . dorzolamide-timolol  1 drop Both Eyes BID  . [START ON 12/09/2018] feeding supplement (PRO-STAT SUGAR FREE 64)  60 mL Per Tube TID  . feeding supplement (VITAL HIGH PROTEIN)  1,000 mL Per Tube Q24H  . insulin aspart  3-9 Units Subcutaneous  Q6H  . mouth rinse  15 mL Mouth Rinse 10 times per day  . pantoprazole (PROTONIX) IV  40 mg Intravenous Q24H   Infusions:  .  prismasol BGK 4/2.5 400 mL/hr at 12/08/18 0501  .  prismasol BGK 4/2.5 200 mL/hr at 12/08/18 0643  . sodium chloride 10 mL/hr at 12/08/18 1400  . ceFEPime (MAXIPIME) IV Stopped (12/08/18 1044)  . heparin    . prismasol BGK 4/2.5 1,500 mL/hr at 12/08/18 1308  . TPN ADULT (ION) 75 mL/hr at 12/08/18 1400  . TPN ADULT (ION)    . vancomycin Stopped (12/07/18 1935)   PRN: sodium chloride, acetaminophen, albuterol, alteplase, fentaNYL (SUBLIMAZE) injection, heparin, hydrALAZINE, metoprolol tartrate, sodium chloride, sodium chloride flush  Assessment: 5 yoF known to pharmacy from TPN and antibiotic dosing; complicated hospital course including cardiac arrest, intubation, VAP, and CRRT, now further complicated with extensive IJ  clot (only L sided catheter removed 6/9). Pharmacy to dose heparin infusion.   Baseline INR, aPTT: baseline INR/aPTT mildly elevated but did have recent acute liver injury  Prior anticoagulation: none  Significant events:  Today, 12/08/2018:  CBC: anemia of acute/chronic illness (and likely also d/t CRRT) requiring multiple transfusions; Hgb 8.4 after blood yesterday, now back down to 7.7; Plt also low but improved from last week  No bleeding or infusion issues per nursing, no heparin or other anticoagulation running in CRRT circuit  Goal of Therapy: Heparin level 0.3-0.7 units/ml Monitor platelets by anticoagulation protocol: Yes  Plan:  Omit bolus d/t anemia  Heparin 1200 units/hr IV infusion  Check heparin level 8 hrs after start  Daily CBC, daily heparin level once stable  Monitor for signs of bleeding or thrombosis   Reuel Boom, PharmD, BCPS [phone] 12/08/2018, 3:03 PM

## 2018-12-08 NOTE — Progress Notes (Signed)
PHARMACY - ADULT TOTAL PARENTERAL NUTRITION CONSULT NOTE   Pharmacy Consult for TPN Indication: prolonged ileus  HPI: 45 yoF admitted 6/7 with perforated small bowel from closed-loop obstruction and underwent emergent exploratory laparotomy. Returned to OR on 6/9 and again on 6/14 for closure of abdominal wall.  Patient coded on 12/26/2022 and developed multiorgan failure. Remains intubated & on CRRT. Pressors have been weaned off and she is completing antibiotic course for sepsis/PNA.  NPO since admission on 6/7. Pharmacy consulted to start TPN 6/16. D/t acute liver injury from shock, initiated TPN at low rate & advance slowly as requested by surgery.  Significant events:  6/18: OK to increase to goal rate per surgery 6/22: CRRT stopped for holiday; CBG 69 overnight, D50 given but TPN continued (55 units insulin in TPN); BS and BM overnight - initiating trickle feeds 6/23: resuming CRRT, return to high-protein TPN; fevers overnight, CCM removing some central lines; will use chemo port for TPN 6/24: began adding lipids to TPN formula   Patient Measurements: Height: '5\' 4"'  (162.6 cm) Weight: 176 lb 2.4 oz (79.9 kg) IBW/kg (Calculated) : 54.7 TPN AdjBW (KG): 59.7 Body mass index is 30.24 kg/m. Usual Weight: 90kg   Intake/Output Summary (Last 24 hours) at 12/08/2018 0855 Last data filed at 12/08/2018 0800 Gross per 24 hour  Intake 2901.03 ml  Output 3632 ml  Net -730.97 ml   CRRT continuing  Insulin Requirements: 9 units in past 24 hours  Current Nutrition: Vital HP at fixed rate of 15 mL/hr   IVF: NS @ 53m/hr  Central access: 11/16/2018 TPN start date: 11/30/18  Recent Labs    12/06/18 0228 12/07/18 0330 12/07/18 1846 12/08/18 0509  NA 134*  135 132* 134* 134*  K 4.1  4.1 4.6 4.8 4.8  CL 101  101 98 99 99  CO2 24  25 21* 21* 23  GLUCOSE 103*  105* 130* 128* 124*  BUN 44*  47* 90* 87* 79*  CREATININE 1.03*  1.09* 2.40* 2.46* 1.84*  CALCIUM 7.5*  7.6* 7.7* 7.6* 7.7*   PHOS 3.1  3.2 5.9* 6.2* 4.3  MG 2.3 2.3  --  2.2  ALBUMIN 1.9*  2.0*  --  1.6*  --   ALKPHOS 285*  --   --   --   AST 32  --   --   --   ALT 53*  --   --   --   BILITOT 1.2  --   --   --   TRIG 81  --   --   --   PREALBUMIN 7.9*  --   --   --   Corr Ca 6/24 = 9.6    ASSESSMENT                                                                                                            Today:   Glucose (goal 100-150) - Hx DM on 70/30 Novolog 20 units BID PTA.  45 units insulin in TPN + ICU resistant scale  CBGs low  6/22 AM, improved after increasing dextrose and reducing insulin in TPN to 45 units/bag  CBGs 110-166 in past 24 hrs.  Most values within goal range.  CBGs and SSI reduced to q6h by CCM.  Electrolytes - Na remains borderline low; all other lytes WNL.  CRRT continues.  Renal - AKI; intrinsic renal function appears to be poor off CRRT; BUN, SCr improving and bicarb WNL on CRRT  LFTs - 6/22: ALI improving; AST/ALT essentially back to baseline although Alk phos still elevated; TBili WNL; albumin remains low  TGs - WNL 6/22  Prealbumin - remains low but improving 6/22  NUTRITIONAL GOALS                                                                                             RD recs: (6/23 - back on CRRT):  Kcal:1530 kcal, Protein:172-187 grams (2.3-2.5 grams/kg), Fluid:>/= 1.8 L/day  Custom TPN at goal rate of 75 ml/hr  to provide: 171 g/day protein, 1530 Kcal/day.  PLAN                                                                                                                          At 1800 today:  Continue TPN at 75 ml/hr  Electrolytes: continue as in yesterday's TPN  Begin adding lipids to TPN with tonight's bag  TPN to contain standard multivitamins, trace elements MWF only due to national backorder.  IVF per MD  ICU resistant-scale glycemic control orders q6h; continue 45 units insulin in TPN  TPN lab panels Mondays &  Thursdays    Clayburn Pert, PharmD, BCPS [phone] 12/08/2018  9:20 AM

## 2018-12-08 NOTE — Progress Notes (Addendum)
NAME:  Carmen Stone, MRN:  [id number], DOB:  1955/11/04, LOS: 60 ADMISSION DATE:  11/15/2018, CONSULTATION DATE:  11/21/2018 REFERRING MD:  Dr. Ninfa Linden, Surgery, CHIEF COMPLAINT:  Abdominal pain   Brief History   63 y/o female presented to ER 11/19/2018 with nausea, vomiting, abdominal pain.  CT abd/pelvis showed focal perforated loop of SB with free air with multiple complex ventral hernias.  Underwent emergent laparotomy with SB resection, lysis of adhesions and wound vac.  Remained on vent and pressors post op.  Hospital course complicated by respiratory arrest 6/13, multiple reintubations, intermittent vasopressor needs.    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 Changing volume goal on CRRT to -100 cc an hour. Weaned 7 hours PSV   6/16 Encephalopathic. Hypertensive, adding labetalol.  IV TNA started 6/17 A little more awake.  Tolerating PSV but SBI exceeds 105.  Volume removal with CVVHD 6/18 Neuro status continues to improve.  Hemodynamics stable however did require brief vasoactive drips during nighttime.  She is now at an net negative fluid volume status.  Likely nearing euvolemic state.  PSV 6/19 Extubated 6/20 reintubated for altered mentation, head CT negative  6/21 Tx PRBC x1.  Off vasopressors 6/23: Febrile overnight, Blood Cx. Transfuse 1 U PRBC 6/24: Afebrile. Remains off pressors. 100cc/hr removal /c CRRT. 30% FiO2.   Consults:  Renal 6/09 AKI, acidosis Cardiology 6/09 Takotsubo CM   Procedures:  ETT 6/07 >> 6/19, 6/20 >> Rt IJ HD 6/09 >> Rt [place]  CVL 6/09 >> Lt radial aline 6/09 >>Discontinued  Significant Diagnostic Tests:  CT abd/pelvis 6/07 >> focal perforated loop of SB with free air with multiple complex ventral hernias Echo 6/08 >> EF 20%, Takotsubo CM CT head 6/20 >> negative for any acute findings CT abdomen 6/20 >> bilateral effusions, atelectasis left lower lobe, results noted.    CT Chest 6/20 >> small bilateral pleural effusions with compressive atelectasis, anterior R 4-7 rib fractures CT ABD/Pelvis 6/20 >> small bowel surgical changes, mild circumferential wall thickening of scattered small bowel loops, small to moderate ascites  Micro Data:  COVID 6/07 >> negative Tracheal aspirate 6/20 >> E Coli Blood Cx 6/23 >>  Antimicrobials:  Zosyn 6/07 >> 6/17 Diflucan 6/07 >> 6/10 Cefepime 6/21 >> Vanco 6/21 >>  Interim history/subjective:  Afebrile overnight. Remains on CRRT.   Following commands. Denies pain.  No sedation or analgesia needs.  Did not tolerated SBT.  +BM. Oliguric.   Objective   Blood pressure (!) 124/50, pulse (!) 108, temperature (!) 97.5 F (36.4 C), resp. rate (!) 33, height 5\' 4"  (1.626 m), weight 79.9 kg, SpO2 100 %.    Vent Mode: PRVC FiO2 (%):  [30 %] 30 % Set Rate:  [18 bmp] 18 bmp Vt Set:  [430 mL] 430 mL PEEP:  [5 cmH20] 5 cmH20 Plateau Pressure:  [15 cmH20-31 cmH20] 31 cmH20   Intake/Output Summary (Last 24 hours) at 12/08/2018 0907 Last data filed at 12/08/2018 0900 Gross per 24 hour  Intake 3000.97 ml  Output 3832 ml  Net -831.03 ml   [employer]  12/05/18 0500 12/06/18 0231 12/07/18 0334  Weight: 75.9 kg 76.9 kg 79.9 kg    Examination: General: well developed, critically ill appearing  Neuro: Alert to verbal. Follows commands x4.  CV:RRR. S1 S2. No MRG.  PULM: BBS rhonchitic upper lobes, FNL, symmetrical. Vent supported. Thin tan secretions.  GI: ABD dressing CDI. Minimal to absent BS x4. Rounded, soft, NT/ND.  Extremities: MAE well but weak. LUE +2 edema,  trace edema to RUE. Trace edema noted to thighs. No edema B/L calves to feet  Skin: PWD. L arm mepilex dressings x 4. Post surfaces not visualized.   Resolved Hospital Problem list   Metabolic alkalosis Abdominal peritonitis with septic shock Circulatory shock, mix of cardiogenic and sepsis off pressors as of 6/15 Cardiorespiratory arrest 6/13 Shock liver Acute metabolic encephalopathy Hypophosphatemia  Assessment & Plan:   Acute Metabolic Encephalopathy -Head CT negative, similar unexplained events during this admission  -no recent sedation or analgesia -Suspect multifactorial in setting of critical illness and protracted course  -She is alert and briskly following commands today P: -neuro checks -encourage sleep/wake cycle -delirium precautions  Fever: -fever curve and leukocytosis improved -Repeat blood Cx pending P: -B/L Lower EXT duplex. LUE duplex  -Continue broad spectrum abx, for now, Vanc/Cefepime. -Subclavian CVC to be d/ced today.  Glori Luis cath accessed yesterday with TPN infusing -when CRRT set goes down and/or pt may transition off CRRT will plan for tunneled HD cath  -Foley changed out. Continue same for I/O and  Nephrology request to keep  to monitor for renal recovery. -Follow fever curve/WBC   Small Bowel Perforation s/p Resection, Lysis of Adhesions -reexploration 6/14, creation of functional end to end small bowel anastomosis and closure of abdominal wound P: -Hopefully can d/c TPN soon as she is tolerating trickle feeds with BM today -remaining plan per surgery  Anemia -H&H continues to be labile /s overt bleeding -suspect anemia of critical illness and nutritional  P: -continue to trend CBC, transfuse if Hgb < 7 -continue to monitor for SnSx bleeding    Acute Hypoxic Respiratory Failure  P: -Continue full vent support -mental status is improving -SBT as able -if fails SBT/weaning would favor trach sooner than later as this is her 3rd  intubation    Atelectasis / Pleural Effusion -likely related to complicated abdominal process  -CXR 6/23 improved  -30% FiO2 P: -CXR prn -consider POCUS to eval for volume of effusion  -continue abx as above -bronchial hygiene, bronchodilator protocol   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%, Takotsubo Cardiomyopathy P: -continue to hold home meds for now: Lisinopril and pravachol -PRN hydralazine  -Will need follow up ECHO  Acute kidney injury from tubular necrosis -baseline creatinine of 0.73 from 11/18/2018 P: -continue CRRT per neph with 100cc/hr volume removal -hopefully CRRT can be d/ced when pt off TPN and can trial iHD -will need tunneled cath-see above  -renal labs per CRRT protocol   Type 2 Diabetes -GLU well controlled 110-124 past 24 hrs not requiring coverage P: -continue SSI-change to Q6H   Thrombocytopenia secondary to sepsis and consumptive  P: -avoid anti PLT -Follow CBC   Moderate Protein Calorie Malnutrition  P: -Tolerating TF. Advance per surgery  -continue TPN per pharmacy for now -CMP tomorrow    Difficult IV Access P: -Left chest portcath accessed yesterday -subclavian CVC will be d/ced today -HD cath as noted above   Best practice:  Diet: TPN. Trickle TF  DVT prophylaxis: SCDs, continue to hold chemoprophylaxis d/t TCP/anemia  GI prophylaxis: Protonix Mobility: Bedrest Code Status: Full code Disposition: continue ICU. Waiting on bed at [facility].   Labs:   CMP Latest Ref Rng & Units 12/08/2018 12/07/2018 12/07/2018  Glucose 70 - 99 mg/dL 124(H) 128(H) 130(H)  BUN 8 - 23 mg/dL 79(H) 87(H) 90(H)  Creatinine 0.44 - 1.00 mg/dL 1.84(H) 2.46(H) 2.40(H)  Sodium 135 - 145 mmol/L 134(L) 134(L) 132(L)  Potassium 3.5 - 5.1 mmol/L 4.8 4.8 4.6  Chloride 98 - 111 mmol/L 99 99 98  CO2 22 - 32 mmol/L 23 21(L) 21(L)  Calcium 8.9 - 10.3 mg/dL 7.7(L) 7.6(L) 7.7(L)  Total Protein 6.5 - 8.1 g/dL - - -  Total Bilirubin 0.3 - 1.2 mg/dL - - -   Alkaline Phos 38 - 126 U/L - - -  AST 15 - 41 U/L - - -  ALT 0 - 44 U/L - - -   CBC Latest Ref Rng & Units 12/08/2018 12/07/2018 12/07/2018  WBC 4.0 - 10.5 K/uL 14.8(H) - 17.3(H)  Hemoglobin 12.0 - 15.0 g/dL 7.7(L) 8.4(L) 6.6(LL)  Hematocrit 36.0 - 46.0 % 23.7(L) 25.0(L) 19.7(L)  Platelets 150 - 400 K/uL 83(L) - 109(L)   ABG    Component Value Date/Time   PHART 7.445 12/04/2018 0435   PCO2ART 37.4 12/04/2018 0435   PO2ART 48.6 (L) 12/04/2018 0435   HCO3 23.4 12/05/2018 1709   TCO2 16 (L) 12/09/2018 1658   ACIDBASEDEF 0.8 12/04/2018 0215   O2SAT 82.4 12/05/2018 1709   CBG (last 3)  Recent Labs    12/07/18 2326 12/08/18 0330 12/08/18 0743  GLUCAP 110* 111* 120*

## 2018-12-08 NOTE — Progress Notes (Signed)
Patient ID: Carmen Stone, female   DOB: 09/11/1955, 63 y.o.   MRN: [id number]    10 Days Post-Op  Subjective: Patient awakens on vent.  Denies abdominal pain but winces some with palpation.  Says no to feeling bloated.  And says yes to a BM this morning.  Unable to find nurse to verify this.  Objective: Vital signs in last 24 hours: Temp:  [96.4 F (35.8 C)-98.1 F (36.7 C)] 97.5 F (36.4 C) (06/24 0800) Pulse Rate:  [91-120] 108 (06/24 0800) Resp:  [18-38] 36 (06/24 0800) BP: (99-164)/(38-73) 164/61 (06/24 0800) SpO2:  [99 %-100 %] 100 % (06/24 0800) FiO2 (%):  [30 %] 30 % (06/24 0739) Last BM Date: 12/07/18  Intake/Output from previous day: 06/23 0701 - 06/24 0700 In: 2980.9 [I.V.:1979.8; Blood:285; NG/GT:375; IV Piggyback:261.1] Out: 3474 [Urine:47] Intake/Output this shift: Total I/O In: 100.1 [I.V.:85.1; NG/GT:15] Out: 194 [Urine:2; Other:192]  PE: Gen: critically ill on vent Heart: regular, but still slightly tachy Lungs: better but still with some rhonchi.  On vent. Abd: soft, but feels more bloated today, NG/OG with trickle TFs running at 15cc/hr.  Midline wound is stable and actually starting to clean up a little at the very inferior part.  Lab Results:  Recent Labs    12/07/18 1021 12/07/18 1736 12/08/18 0509  WBC 17.3*  --  14.8*  HGB 6.6* 8.4* 7.7*  HCT 19.7* 25.0* 23.7*  PLT 109*  --  83*   BMET Recent Labs    12/07/18 1846 12/08/18 0509  NA 134* 134*  K 4.8 4.8  CL 99 99  CO2 21* 23  GLUCOSE 128* 124*  BUN 87* 79*  CREATININE 2.46* 1.84*  CALCIUM 7.6* 7.7*   PT/INR No results for input(s): LABPROT, INR in the last 72 hours. CMP     Component Value Date/Time   NA 134 (L) 12/08/2018 0509   NA 138 06/01/2017 0859   K 4.8 12/08/2018 0509   K 4.2 06/01/2017 0859   CL 99 12/08/2018 0509   CO2 23 12/08/2018 0509   CO2 24 06/01/2017 0859   GLUCOSE 124 (H) 12/08/2018 0509   GLUCOSE 118 06/01/2017 0859   BUN 79 (H) 12/08/2018 0509   BUN 15.2 06/01/2017 0859   CREATININE 1.84 (H) 12/08/2018 0509   CREATININE 0.79 06/02/2018 0856   CREATININE 0.7 06/01/2017 0859   CALCIUM 7.7 (L) 12/08/2018 0509   CALCIUM 9.0 06/01/2017 0859   PROT 5.8 (L) 12/06/2018 0228   PROT 7.0 06/01/2017 0859   ALBUMIN 1.6 (L) 12/07/2018 1846   ALBUMIN 3.7 06/01/2017 0859   AST 32 12/06/2018 0228   AST 13 (L) 06/02/2018 0856   AST 14 06/01/2017 0859   ALT 53 (H) 12/06/2018 0228   ALT 16 06/02/2018 0856   ALT 12 06/01/2017 0859   ALKPHOS 285 (H) 12/06/2018 0228   ALKPHOS 97 06/01/2017 0859   BILITOT 1.2 12/06/2018 0228   BILITOT 0.3 06/02/2018 0856   BILITOT 0.22 06/01/2017 0859   GFRNONAA 28 (L) 12/08/2018 0509   GFRNONAA >60 06/02/2018 0856   GFRAA 33 (L) 12/08/2018 0509   GFRAA >60 06/02/2018 0856   Lipase     Component Value Date/Time   LIPASE 24 12/09/2018 0355       Studies/Results: Dg Chest Port 1 View  Result Date: 12/07/2018 CLINICAL DATA:  Shortness of breath. EXAM: PORTABLE CHEST 1 VIEW COMPARISON:  CT 12/04/2018.  Chest x-ray 12/04/1998. FINDINGS: Endotracheal tube, NG tube, left PowerPort catheter, right IJ catheter  in stable position. Heart size normal. Bibasilar atelectasis/infiltrates, improved from prior exam. Interval near complete clearing of bilateral pleural effusions. Stable right apical pleural-parenchymal thickening. No pneumothorax. Surgical clips right axilla. IMPRESSION: 1.  Lines and tubes in stable position. 2. Bibasilar atelectasis/infiltrates and bilateral pleural effusions have improved from prior exam. Electronically Signed   By: Ione   On: 12/07/2018 08:39    Anti-infectives: Anti-infectives (From admission, onward)   Start     Dose/Rate Route Frequency Ordered Stop   12/08/18 1800  vancomycin (VANCOCIN) IVPB 750 mg/150 ml premix  Status:  Discontinued     750 mg 150 mL/hr over 60 Minutes Intravenous Every 48 hours 12/07/18 0822 12/07/18 0903   12/08/18 1000  ceFEPIme (MAXIPIME) 2 g  in sodium chloride 0.9 % 100 mL IVPB  Status:  Discontinued     2 g 200 mL/hr over 30 Minutes Intravenous Every 24 hours 12/07/18 0822 12/07/18 0903   12/07/18 2200  ceFEPIme (MAXIPIME) 2 g in sodium chloride 0.9 % 100 mL IVPB     2 g 200 mL/hr over 30 Minutes Intravenous Every 12 hours 12/07/18 0903     12/07/18 1800  vancomycin (VANCOCIN) IVPB 750 mg/150 ml premix     750 mg 150 mL/hr over 60 Minutes Intravenous Every 24 hours 12/07/18 0903     12/06/18 1800  vancomycin (VANCOCIN) IVPB 750 mg/150 ml premix  Status:  Discontinued     750 mg 150 mL/hr over 60 Minutes Intravenous Every 24 hours 12/05/18 1652 12/07/18 0822   12/05/18 1800  ceFEPIme (MAXIPIME) 2 g in sodium chloride 0.9 % 100 mL IVPB  Status:  Discontinued     2 g 200 mL/hr over 30 Minutes Intravenous Every 12 hours 12/05/18 1644 12/07/18 0822   12/05/18 1700  vancomycin (VANCOCIN) 1,250 mg in sodium chloride 0.9 % 250 mL IVPB     1,250 mg 166.7 mL/hr over 90 Minutes Intravenous  Once 12/05/18 1652 12/05/18 2019   11/19/2018 1800  fluconazole (DIFLUCAN) IVPB 200 mg  Status:  Discontinued     200 mg 100 mL/hr over 60 Minutes Intravenous Every 24 hours 11/15/2018 0748 12/11/2018 0847   11/18/2018 1800  fluconazole (DIFLUCAN) IVPB 400 mg  Status:  Discontinued     400 mg 100 mL/hr over 120 Minutes Intravenous Every 24 hours 12/11/2018 0847 11/26/2018 0856   12/12/2018 1400  piperacillin-tazobactam (ZOSYN) IVPB 2.25 g  Status:  Discontinued     2.25 g 100 mL/hr over 30 Minutes Intravenous Every 6 hours 11/20/2018 0748 12/01/18 1104   11/30/2018 0600  cefoTEtan (CEFOTAN) 2 g in sodium chloride 0.9 % 100 mL IVPB     2 g 200 mL/hr over 30 Minutes Intravenous On call to O.R. 11/22/18 1300 12/09/2018 0624   11/22/18 1800  fluconazole (DIFLUCAN) IVPB 400 mg  Status:  Discontinued     400 mg 100 mL/hr over 120 Minutes Intravenous Every 24 hours 11/30/2018 1528 11/26/2018 0748   11/22/18 1800  vancomycin (VANCOCIN) 1,500 mg in sodium chloride 0.9 % 500 mL  IVPB  Status:  Discontinued     1,500 mg 250 mL/hr over 120 Minutes Intravenous Every 24 hours 11/22/18 0358 11/22/18 0750   11/22/18 0115  vancomycin (VANCOCIN) 1,500 mg in sodium chloride 0.9 % 500 mL IVPB     1,500 mg 250 mL/hr over 120 Minutes Intravenous  Once 11/22/18 0101 11/22/18 0319   11/30/2018 1630  fluconazole (DIFLUCAN) IVPB 800 mg     800 mg 100  mL/hr over 240 Minutes Intravenous  Once 12/02/2018 1527 12/03/2018 2013   12/06/2018 1600  piperacillin-tazobactam (ZOSYN) IVPB 3.375 g  Status:  Discontinued     3.375 g 12.5 mL/hr over 240 Minutes Intravenous Every 8 hours 11/15/2018 1522 11/19/2018 0748   12/14/2018 0545  piperacillin-tazobactam (ZOSYN) IVPB 3.375 g     3.375 g 100 mL/hr over 30 Minutes Intravenous  Once 12/07/2018 0530 12/14/2018 0626   11/15/2018 0530  piperacillin-tazobactam (ZOSYN) IVPB 4.5 g  Status:  Discontinued     4.5 g 200 mL/hr over 30 Minutes Intravenous  Once 11/29/2018 0517 11/29/2018 0529       Assessment/Plan HTN HLD DM Hypothyroidism 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  Will follow ARF-back on CRRT Hypocalcemia -defer to nephro given CRRT Thrombocytopenia-80s Septic shock - onvent Appreciate CCM assistance   Perforated small bowel from closed-loop obstruction 1.S/pEXPLORATORY LAPAROTOMY,SMALL BOWEL RESECTION,LYSIS OF ADHESIONS (1.5 HOURS),PLACEMENT OF WOUND VAC6/7 Dr. Ninfa Linden- POD #17 2. S/pReexploration of recent laparotomyWITH [facility] OUT (N/A)6/9 Dr. Rosendo Gros- POD #15 3. S/pExploratory laparotomy with washout of abdomen and placement of vacuum VAC dressing6/11 Dr. Brantley Stage- POD #13 4. Exploratory laparotomy with anastomosis of SB and closure of abdominal wall 6/14 Dr. Brantley Stage- POD #10 -cont TFs at trickle rate only for now.  will check x-ray today to eval for bowl dilatation as she seems more bloated. - Cont TNAfor now, but if we  can adv TFs hopefully we can get this off soon. LFTs downtrending -Cont BID NSWD dressing changes to midline wound - WBCdown todayto 14K ID -diflucan 6/7-6/11, vancomycin 6/8-6/8.zosyn 6/7>>6/21, Cefepime 6/21 --> FEN -IVF, NGT with trickle TFs.TNA  VTE -SCDs, heparin Foley -in place Contact - brother GeorgeMattier([phone] or [phone]) - pt is full code   LOS: 17 days    [name] , [facility] 12/08/2018, 8:26 AM Pager: [phone]

## 2018-12-08 NOTE — Progress Notes (Signed)
Pharmacy Antibiotic Note  Carmen Stone is a 63 y.o. female admitted on 11/30/2018 with perforated loop of SB. Her hospital stay has been complicated by cardiac arrest requiring intubation & pressors as well as CRRT for AKI.  She completed 10d course of Zosyn on 6/17.   She had low grade fever of 100.50F & bradycardia requiring pressors to be resumed today.  WBC has also been trending up off Zosyn.  Chest CT 6/20 could not rule out pneumonia.  Pharmacy was consulted for Vancomycin & Cefepime dosing.  6/23: new fevers, BCx ordered, removed 1 of 2 central lines, changed Foley, using Port for TPN   CRRT restarted 6/23  Plan:  Cefepime 2gm IV q12h- adjusted for CRRT dosing  Vancomycin 1250mg  IV x1 then 750mg  IV q24h  F/u renal fxn, cx data & clinical course  Height: 5\' 4"  (162.6 cm) Weight: 176 lb 2.4 oz (79.9 kg) IBW/kg (Calculated) : 54.7  Temp (24hrs), Avg:97.3 F (36.3 C), Min:96.4 F (35.8 C), Max:98.1 F (36.7 C)  Recent Labs  Lab 12/04/18 0500  12/05/18 0724 12/05/18 1600 12/06/18 0228 12/07/18 0330 12/07/18 1021 12/07/18 1846 12/08/18 0509  WBC 31.7*  --  25.4*  --  24.0*  --  17.3*  --  14.8*  CREATININE 1.02*   < >  --  1.25* 1.03*  1.09* 2.40*  --  2.46* 1.84*   < > = values in this interval not displayed.    Estimated Creatinine Clearance: 31.6 mL/min (A) (by C-G formula based on SCr of 1.84 mg/dL (H)).    No Known Allergies  Antimicrobials this admission: 6/7 zosyn > 6/17 6/7 fluconazole > 6/11 6/8 vancomycin >> 6/8, 6/21>> 6/21 cefepime>>  Dose adjustments this admission: 6/9: adjusted Zosyn from EI to 2.25g IV q6 for CRRT  Microbiology results: 6/7 Covid 19 negative 6/8 MRSA PCR: negative 6/21 Tracheal aspirate: rare E.Coli, S to all abx tested except R to ampicillin and Unasyn 6/23 Blood: collected  Thank you for allowing pharmacy to be a part of this patient's care.  Clayburn Pert, PharmD, BCPS [phone] 12/08/2018  10:37 AM

## 2018-12-08 NOTE — Progress Notes (Signed)
[facility] Progress Note   Subjective:    -500 cc yest , no clotting filters.  Had BM per RN.    Objective:   BP (!) 164/61   Pulse (!) 108   Temp (!) 97.5 F (36.4 C)   Resp (!) 36   Ht '5\' 4"'  (1.626 m)   Wt 79.9 kg   SpO2 100%   BMI 30.24 kg/m   Intake/Output Summary (Last 24 hours) at 12/08/2018 0815 Last data filed at 12/08/2018 0800 Gross per 24 hour  Intake 2901.03 ml  Output 3632 ml  Net -730.97 ml   Weight change:   Physical Exam: Genintubated NECK: no JVD  PULM: clear bilat CV: RRR no MRG ABD: soft, dressings c/d/i, quiet bowel sounds GUfoley cath minimal urine,  Ext: 1-2+ diffuse edema Neuro: intubated   Assessment/ Plan:     1. Acute renal failure: last known baseline 0.79 05/2018.  She has acute oliguric AKI in the setting of septic shock due to perf bowel likely 2/2/ ATN. SP CRRT 6/9 - 6/12, held after PEA arrest.  Resumed 6/14 after abdominal closure.  - cont CRRT for now, intermittent HD not advised w/ volume load of TPN - UF 50- 100 cc/hr as long as not dropping BP's  2. Septic shock: appears to have resolved, remains off pressors. s/p courses of Zosyn and fluconazole.   3. Acute hypoxic respiratory failure: reintubated 6/20, CT head negative  4. Perforated small bowel due to closed-loop SBO / septic shock- per CCM/ gen surg, s/p closure 6/14. NGT in place  5. DM: per primary  6. Takatsubo's cardiomyopathy: EF ~ 20%  7. Severe hypoalbuminemia: per primary-- on TPN  8. SP PEA arrest: on 6/13  Kelly Splinter, MD 12/06/2018, 3:16 PM    Labs: BMET Recent Labs  Lab 12/04/18 1601 12/05/18 0513 12/05/18 1600 12/06/18 0228 12/07/18 0330 12/07/18 1846 12/08/18 0509  NA 136 134* 131* 134*  135 132* 134* 134*  K 3.7 3.6 4.2 4.1  4.1 4.6 4.8 4.8  CL 104 102 98 101  101 98 99 99  CO2 '23 24 22 24  25 ' 21* 21* 23  GLUCOSE 113* 127* 164* 103*  105* 130* 128* 124*  BUN 42* 42* 53* 44*  47* 90* 87* 79*  CREATININE 0.98  0.94 1.25* 1.03*  1.09* 2.40* 2.46* 1.84*  CALCIUM 7.2* 7.2* 7.5* 7.5*  7.6* 7.7* 7.6* 7.7*  PHOS 2.5 2.4* 4.4 3.1  3.2 5.9* 6.2* 4.3   CBC Recent Labs  Lab 12/05/18 0724  12/06/18 0228 12/07/18 1021 12/07/18 1736 12/08/18 0509  WBC 25.4*  --  24.0* 17.3*  --  14.8*  NEUTROABS  --   --  21.2*  --   --   --   HGB 6.5*   < > 7.5* 6.6* 8.4* 7.7*  HCT 20.0*   < > 23.0* 19.7* 25.0* 23.7*  MCV 92.2  --  90.9 91.6  --  92.6  PLT 87*  --  85* 109*  --  83*   < > = values in this interval not displayed.    Medications:    . chlorhexidine gluconate (MEDLINE KIT)  15 mL Mouth Rinse BID  . Chlorhexidine Gluconate Cloth  6 each Topical Daily  . dorzolamide-timolol  1 drop Both Eyes BID  . feeding supplement (VITAL HIGH PROTEIN)  1,000 mL Per Tube Q24H  . insulin aspart  3-9 Units Subcutaneous Q4H  . mouth rinse  15 mL Mouth Rinse 10  times per day  . pantoprazole (PROTONIX) IV  40 mg Intravenous Q24H

## 2018-12-08 NOTE — Progress Notes (Signed)
Nutrition Follow-up  RD working remotely.   DOCUMENTATION CODES:   Not applicable  INTERVENTION:  - will adjust TF regimen: Vital High Protein @ 20 ml/hr to advance by 10 ml every 12 hours to reach goal rate of 40 ml/hr with 60 ml prostat TID. - at goal rate, this regimen will provide 1560 kcal (106% estimated kcal need), 174 grams protein, and 802 ml free water.  - TPN/TPN weaning per Pharmacy and Surgery's discretion.  -recommend replacement of NGT; current tube placed on 6/7 (17 days ago).   NUTRITION DIAGNOSIS:   Inadequate oral intake related to inability to eat as evidenced by NPO status. -ongoing  GOAL:   Patient will meet greater than or equal to 90% of their needs -met with nutrition support regimens  MONITOR:   Vent status, TF tolerance, Labs, Weight trends, Skin, Other (Comment)(TPN regimen)  REASON FOR ASSESSMENT:   Consult Enteral/tube feeding initiation and management(increase TF to goal rate.)  ASSESSMENT:   63 year old female with past medical history of type 2 DM, hyperlipidemia, HTN, inflammatory R breast cancer s/p radiation in 2015. Patient presented to the ED on 6/7 with severe abdominal pain, N/V. CT abdomen/pelvis which showed perforated small bowel with large amount of free air and multiple incarcerated incisional hernias.   Significant Events: 6/7- admission; ex lap, small bowel resection, LOAs, and placement of wound vac; NGT placement 6/9- CRRT initiation; return to OR for re-opening of open abdomen with washout 6/12- CRRT stopped 6/14- bowel anastomosis and closure of abdominal wall; CRRT re-started 6/16- initiation of TPN 6/19- extubation 6/20- re-intubation for AMS 6/21- CRRT stopped for break 6/22- initiation of trickle TF 6/23- CRRT re-started   Weight is trending back up with RN flow sheet indicating patient has moderate pitting edema to RUE and BLE and deep pitting edema to LUE. Re-estimated nutrition needs based on admission weight  of 74.8 kg.  Patient remains intubated with NGT in place and is receiving Vital High Protein @ 15 ml/hr which is providing 360 kcal, 31 grams protein, and 301 ml free water. She is receiving custom TPN at goal rate of 75 ml/hr which is providing 1530 kcal, 171 grams protein. Nutrition support is providing a total of 1890 kcal, 202 grams protein.   Per notes: metabolic encephalopathy, fever with cultures pending and ongoing abx, plan to increase TF to goal rate with the goal of discontinuing TPN, acute hypoxic respiratory failure--may need trach, atelectasis/pleural effusion, AKI from ATN back on CRRT.   Patient is currently intubated on ventilator support MV: 11 L/min Temp (24hrs), Avg:97.2 F (36.2 C), Min:96.4 F (35.8 C), Max:97.8 F (36.6 C) Propofol: none   Medications reviewed; sliding scale novolog.  Labs reviewed; CBGs: 111, 120, and 131 mg/dl today, Na: 134 mmol/l, BUN: 79 mg/dl, creatinine: 1.84 mg/dl, Ca: 7.7 mg/dl, GFR: 33 ml/min.    Diet Order:   Diet Order    None      EDUCATION NEEDS:   No education needs have been identified at this time  Skin:  Skin Assessment: Skin Integrity Issues: Skin Integrity Issues:: Stage II Stage II: L ear Wound Vac: abdomen Incisions: abdomen (6/7, 6/9, 6/13)  Last BM:  6/24  Height:   Ht Readings from Last 1 Encounters:  12/04/18 '5\' 4"'  (1.626 m)    Weight:   Wt Readings from Last 1 Encounters:  12/07/18 79.9 kg    Ideal Body Weight:  54.5 kg  BMI:  Body mass index is 30.24 kg/m.  Estimated Nutritional Needs:  Kcal:  1477  Protein:  172-187 grams (2.3-2.5 grams/kg)  Fluid:  >/= 1.8 L/day     Jarome Matin, MS, RD, LDN, [facility] Inpatient Clinical Dietitian Pager # [phone] After hours/weekend pager # [phone]

## 2018-12-08 NOTE — Progress Notes (Addendum)
Notified of extensive upper extremity DVT, left IJ  Start on heparin  Will hold off on removing line as it is our only access  Groin is wet, not macerated but, risk of infection is astronomical for a groin access

## 2018-12-09 ENCOUNTER — Inpatient Hospital Stay (HOSPITAL_COMMUNITY): Payer: BLUE CROSS/BLUE SHIELD

## 2018-12-09 LAB — RENAL FUNCTION PANEL
Albumin: 1.9 g/dL — ABNORMAL LOW (ref 3.5–5.0)
Anion gap: 11 (ref 5–15)
BUN: 68 mg/dL — ABNORMAL HIGH (ref 8–23)
CO2: 25 mmol/L (ref 22–32)
Calcium: 8.2 mg/dL — ABNORMAL LOW (ref 8.9–10.3)
Chloride: 100 mmol/L (ref 98–111)
Creatinine, Ser: 1.61 mg/dL — ABNORMAL HIGH (ref 0.44–1.00)
GFR calc Af Amer: 39 mL/min — ABNORMAL LOW (ref >60)
GFR calc non Af Amer: 33 mL/min — ABNORMAL LOW (ref >60)
Glucose, Bld: 126 mg/dL — ABNORMAL HIGH (ref 70–99)
Phosphorus: 3.5 mg/dL (ref 2.5–4.6)
Potassium: 5 mmol/L (ref 3.5–5.1)
Sodium: 136 mmol/L (ref 135–145)

## 2018-12-09 LAB — CBC
HCT: 24.6 % — ABNORMAL LOW (ref 36.0–46.0)
Hemoglobin: 8.1 g/dL — ABNORMAL LOW (ref 12.0–15.0)
MCH: 30.7 pg (ref 26.0–34.0)
MCHC: 32.9 g/dL (ref 30.0–36.0)
MCV: 93.2 fL (ref 80.0–100.0)
Platelets: 111 10*3/uL — ABNORMAL LOW (ref 150–400)
RBC: 2.64 MIL/uL — ABNORMAL LOW (ref 3.87–5.11)
RDW: 17.5 % — ABNORMAL HIGH (ref 11.5–15.5)
WBC: 16 10*3/uL — ABNORMAL HIGH (ref 4.0–10.5)
nRBC: 0.5 % — ABNORMAL HIGH (ref 0.0–0.2)

## 2018-12-09 LAB — COMPREHENSIVE METABOLIC PANEL
ALT: 39 U/L (ref 0–44)
AST: 28 U/L (ref 15–41)
Albumin: 1.7 g/dL — ABNORMAL LOW (ref 3.5–5.0)
Alkaline Phosphatase: 173 U/L — ABNORMAL HIGH (ref 38–126)
Anion gap: 15 (ref 5–15)
BUN: 69 mg/dL — ABNORMAL HIGH (ref 8–23)
CO2: 21 mmol/L — ABNORMAL LOW (ref 22–32)
Calcium: 8.2 mg/dL — ABNORMAL LOW (ref 8.9–10.3)
Chloride: 99 mmol/L (ref 98–111)
Creatinine, Ser: 1.49 mg/dL — ABNORMAL HIGH (ref 0.44–1.00)
GFR calc Af Amer: 43 mL/min — ABNORMAL LOW (ref >60)
GFR calc non Af Amer: 37 mL/min — ABNORMAL LOW (ref >60)
Glucose, Bld: 147 mg/dL — ABNORMAL HIGH (ref 70–99)
Potassium: 5.1 mmol/L (ref 3.5–5.1)
Sodium: 135 mmol/L (ref 135–145)
Total Bilirubin: 0.9 mg/dL (ref 0.3–1.2)
Total Protein: 7 g/dL (ref 6.5–8.1)

## 2018-12-09 LAB — GLUCOSE, CAPILLARY
Glucose-Capillary: 116 mg/dL — ABNORMAL HIGH (ref 70–99)
Glucose-Capillary: 116 mg/dL — ABNORMAL HIGH (ref 70–99)
Glucose-Capillary: 127 mg/dL — ABNORMAL HIGH (ref 70–99)
Glucose-Capillary: 135 mg/dL — ABNORMAL HIGH (ref 70–99)
Glucose-Capillary: 97 mg/dL (ref 70–99)

## 2018-12-09 LAB — MAGNESIUM: Magnesium: 2.4 mg/dL (ref 1.7–2.4)

## 2018-12-09 LAB — PHOSPHORUS: Phosphorus: 3.4 mg/dL (ref 2.5–4.6)

## 2018-12-09 LAB — HEPARIN LEVEL (UNFRACTIONATED)
Heparin Unfractionated: 0.22 IU/mL — ABNORMAL LOW (ref 0.30–0.70)
Heparin Unfractionated: 0.35 IU/mL (ref 0.30–0.70)

## 2018-12-09 LAB — LACTIC ACID, PLASMA: Lactic Acid, Venous: 2 mmol/L (ref 0.5–1.9)

## 2018-12-09 LAB — APTT: aPTT: 85 seconds — ABNORMAL HIGH (ref 24–36)

## 2018-12-09 MED ORDER — HEPARIN (PORCINE) 25000 UT/250ML-% IV SOLN
1450.0000 [IU]/h | INTRAVENOUS | Status: DC
  Administered 2018-12-09: 16:00:00 1450 [IU]/h via INTRAVENOUS

## 2018-12-09 MED ORDER — FENTANYL 2500MCG IN NS 250ML (10MCG/ML) PREMIX INFUSION
0.0000 ug/h | INTRAVENOUS | Status: DC
  Administered 2018-12-09 – 2018-12-10 (×2): 25 ug/h via INTRAVENOUS
  Administered 2018-12-12: 75 ug/h via INTRAVENOUS
  Administered 2018-12-13: 100 ug/h via INTRAVENOUS
  Administered 2018-12-16: 50 ug/h via INTRAVENOUS
  Administered 2018-12-17: 100 ug/h via INTRAVENOUS
  Administered 2018-12-19: 25 ug/h via INTRAVENOUS
  Administered 2018-12-19: 75 ug/h via INTRAVENOUS
  Administered 2018-12-21: 100 ug/h via INTRAVENOUS
  Administered 2018-12-22: 50 ug/h via INTRAVENOUS
  Administered 2018-12-23: 100 ug/h via INTRAVENOUS
  Administered 2018-12-24 – 2018-12-27 (×5): 150 ug/h via INTRAVENOUS
  Administered 2018-12-28: 09:00:00 250 ug/h via INTRAVENOUS
  Administered 2018-12-28: 150 ug/h via INTRAVENOUS
  Administered 2018-12-28: 200 ug/h via INTRAVENOUS
  Administered 2018-12-29 – 2018-12-31 (×2): 125 ug/h via INTRAVENOUS
  Administered 2019-01-01: 150 ug/h via INTRAVENOUS
  Administered 2019-01-02: 300 ug/h via INTRAVENOUS
  Administered 2019-01-02: 250 ug/h via INTRAVENOUS
  Administered 2019-01-03 – 2019-01-04 (×6): 300 ug/h via INTRAVENOUS
  Administered 2019-01-05 – 2019-01-06 (×2): 200 ug/h via INTRAVENOUS
  Administered 2019-01-06: 175 ug/h via INTRAVENOUS
  Administered 2019-01-07: 125 ug/h via INTRAVENOUS
  Administered 2019-01-07: 175 ug/h via INTRAVENOUS
  Administered 2019-01-08: 75 ug/h via INTRAVENOUS
  Filled 2018-12-09 (×41): qty 250

## 2018-12-09 MED ORDER — STERILE WATER FOR INJECTION IJ SOLN
INTRAMUSCULAR | Status: AC
  Filled 2018-12-09: qty 10

## 2018-12-09 MED ORDER — ALTEPLASE 2 MG IJ SOLR
2.0000 mg | Freq: Once | INTRAMUSCULAR | Status: AC
  Administered 2018-12-09: 2 mg
  Filled 2018-12-09: qty 2

## 2018-12-09 MED ORDER — HEPARIN (PORCINE) 25000 UT/250ML-% IV SOLN
1400.0000 [IU]/h | INTRAVENOUS | Status: DC
  Administered 2018-12-09: 11:00:00 1400 [IU]/h via INTRAVENOUS
  Filled 2018-12-09: qty 250

## 2018-12-09 MED ORDER — ALTEPLASE 2 MG IJ SOLR: 2.0000 mg | Freq: Once | INTRAMUSCULAR | Status: AC

## 2018-12-09 MED ORDER — STERILE WATER FOR INJECTION IV SOLN
INTRAVENOUS | Status: AC
  Administered 2018-12-09: 18:00:00 via INTRAVENOUS
  Filled 2018-12-09: qty 1140

## 2018-12-09 MED ORDER — CHLORHEXIDINE GLUCONATE CLOTH 2 % EX PADS
6.0000 | MEDICATED_PAD | Freq: Once | CUTANEOUS | Status: AC
  Administered 2018-12-09: 23:00:00 6 via TOPICAL

## 2018-12-09 MED ORDER — CHLORHEXIDINE GLUCONATE CLOTH 2 % EX PADS
6.0000 | MEDICATED_PAD | Freq: Once | CUTANEOUS | Status: AC
  Administered 2018-12-10: 05:00:00 6 via TOPICAL

## 2018-12-09 NOTE — Progress Notes (Signed)
CRITICAL VALUE ALERT  Critical Value:  Lactic Acid 2.0  Date & Time Notied: 12/09/2018 1925  Provider Notified: Harlow Asa, MD  Orders Received/Actions taken: No new orders at this time

## 2018-12-09 NOTE — Progress Notes (Signed)
CRRT resumed at 2045 without incident. Pt tolerating well.

## 2018-12-09 NOTE — Progress Notes (Signed)
PHARMACY - ADULT TOTAL PARENTERAL NUTRITION CONSULT NOTE   Pharmacy Consult for TPN Indication: prolonged ileus  HPI: 28 yoF admitted 6/7 with perforated small bowel from closed-loop obstruction and underwent emergent exploratory laparotomy. Returned to OR on 6/9 and again on 6/14 for closure of abdominal wall.  Patient coded on 2022/12/09 and developed multiorgan failure. Remains intubated & on CRRT. Pressors have been weaned off and she is completing antibiotic course for sepsis/PNA.  NPO since admission on 6/7. Pharmacy consulted to start TPN 6/16. D/t acute liver injury from shock, initiated TPN at low rate & advance slowly as requested by surgery.  Significant events:  6/18: OK to increase to goal rate per surgery 6/22: CRRT stopped for holiday; CBG 69 overnight, D50 given but TPN continued (55 units insulin in TPN); BS and BM overnight - initiating trickle feeds 6/23: resuming CRRT, return to high-protein TPN; fevers overnight, CCM removing some central lines; will use chemo port for TPN 6/24: began adding lipids to TPN formula 6/25: trickle feeds stopped d/t abd distention   Patient Measurements: Height: 5\' 4"  (162.6 cm) Weight: 171 lb 11.8 oz (77.9 kg) IBW/kg (Calculated) : 54.7 TPN AdjBW (KG): 59.7 Body mass index is 29.48 kg/m. Usual Weight: 90kg   Intake/Output Summary (Last 24 hours) at 12/09/2018 1054 Last data filed at 12/09/2018 1000 Gross per 24 hour  Intake 2930.44 ml  Output 5555 ml  Net -2624.56 ml   CRRT continuing  Insulin Requirements: 9 units in past 24 hours  Current Nutrition: Vital HP at fixed rate of 15 mL/hr   IVF: NS @ 38ml/hr  Central access: 12/09/2018 TPN start date: 11/30/18  Recent Labs    12/08/18 0509 12/08/18 1910 12/09/18 0455  NA 134* 135 135  K 4.8 5.1 5.1  CL 99 99 99  CO2 23 23 21*  GLUCOSE 124* 149* 147*  BUN 79* 67* 69*  CREATININE 1.84* 1.61* 1.49*  CALCIUM 7.7* 7.9* 8.2*  PHOS 4.3 3.6 3.4  MG 2.2  --  2.4  ALBUMIN  --   1.6* 1.7*  ALKPHOS  --   --  173*  AST  --   --  28  ALT  --   --  39  BILITOT  --   --  0.9  Corr Ca 6/24 = 9.6    ASSESSMENT                                                                                                            Today:   Glucose (goal 100-150) - Hx DM on 70/30 Novolog 20 units BID PTA.  45 units insulin in TPN + ICU resistant scale; received 6 units SSI yesterday  CBGs well controlled on current regimen (range 127-135)  Electrolytes - K still elevated today, all other lytes WNL; CRRT continues.  Renal - AKI; intrinsic renal function still appears to be poor; SCr improving with CRRT resumed, BUN elevated and bicarb remains low  LFTs - enzymes continue to normalize; TBili WNL; albumin remains low  TGs - WNL 6/22  Prealbumin - remains low but improving 6/22  NUTRITIONAL GOALS                                                                                             RD recs: (6/23 - back on CRRT):  Kcal:1530 kcal, Protein:172-187 grams (2.3-2.5 grams/kg), Fluid:>/= 1.8 L/day  Custom TPN at goal rate of 75 ml/hr  to provide: 171 g/day protein, 1530 Kcal/day.  PLAN                                                                                                                          At 1800 today:  Continue TPN at 75 ml/hr  Electrolytes: no K or Mag, others unchanged  Begin adding lipids to TPN with tonight's bag  TPN to contain standard multivitamins, trace elements MWF only due to national backorder.  IVF per MD  ICU resistant-scale glycemic control orders q6h; continue 45 units insulin in TPN  TPN lab panels Mondays & Thursdays  [place], PharmD, BCPS [phone] 12/09/2018, 11:00 AM

## 2018-12-09 NOTE — Progress Notes (Signed)
Patient ID: Carmen Stone, female   DOB: [birth year], 63 y.o.   MRN: [id number]    11 Days Post-Op  Subjective: Patient still on vent. Found to have L IJ, subclavian, axillary DVT yesterday.  Now on heparin gtt.  Still on CRRT.  Not making really any urine still. Had another BM overnight   Objective: Vital signs in last 24 hours: Temp:  [97 F (36.1 C)-98.4 F (36.9 C)] 97.9 F (36.6 C) (06/25 0700) Pulse Rate:  [96-120] 120 (06/25 0700) Resp:  [25-42] 35 (06/25 0700) BP: (109-144)/(50-73) 124/61 (06/25 0700) SpO2:  [100 %] 100 % (06/25 0730) FiO2 (%):  [30 %] 30 % (06/25 0730) Weight:  [77.9 kg] 77.9 kg (06/25 0500) Last BM Date: 12/08/18  Intake/Output from previous day: 06/24 0701 - 06/25 0700 In: 2840.9 [I.V.:2107.3; NG/GT:494.6; IV Piggyback:238.5] Out: 8469 [Urine:36] Intake/Output this shift: No intake/output data recorded.  PE: Heart: tachy, but regular Lungs: clearing up and sound much improved.  Mostly vent sounds heard Abd: tight and distended, some BS, midline wound stable with some necrotic adipose tissue.  NGT in place with TFs running.  Lab Results:  Recent Labs    12/08/18 0509 12/09/18 0455  WBC 14.8* 16.0*  HGB 7.7* 8.1*  HCT 23.7* 24.6*  PLT 83* 111*   BMET Recent Labs    12/08/18 1910 12/09/18 0455  NA 135 135  K 5.1 5.1  CL 99 99  CO2 23 21*  GLUCOSE 149* 147*  BUN 67* 69*  CREATININE 1.61* 1.49*  CALCIUM 7.9* 8.2*   PT/INR No results for input(s): LABPROT, INR in the last 72 hours. CMP     Component Value Date/Time   NA 135 12/09/2018 0455   NA 138 06/01/2017 0859   K 5.1 12/09/2018 0455   K 4.2 06/01/2017 0859   CL 99 12/09/2018 0455   CO2 21 (L) 12/09/2018 0455   CO2 24 06/01/2017 0859   GLUCOSE 147 (H) 12/09/2018 0455   GLUCOSE 118 06/01/2017 0859   BUN 69 (H) 12/09/2018 0455   BUN 15.2 06/01/2017 0859   CREATININE 1.49 (H) 12/09/2018 0455   CREATININE 0.79 06/02/2018 0856   CREATININE 0.7 06/01/2017 0859   CALCIUM 8.2 (L) 12/09/2018 0455   CALCIUM 9.0 06/01/2017 0859   PROT 7.0 12/09/2018 0455   PROT 7.0 06/01/2017 0859   ALBUMIN 1.7 (L) 12/09/2018 0455   ALBUMIN 3.7 06/01/2017 0859   AST 28 12/09/2018 0455   AST 13 (L) 06/02/2018 0856   AST 14 06/01/2017 0859   ALT 39 12/09/2018 0455   ALT 16 06/02/2018 0856   ALT 12 06/01/2017 0859   ALKPHOS 173 (H) 12/09/2018 0455   ALKPHOS 97 06/01/2017 0859   BILITOT 0.9 12/09/2018 0455   BILITOT 0.3 06/02/2018 0856   BILITOT 0.22 06/01/2017 0859   GFRNONAA 37 (L) 12/09/2018 0455   GFRNONAA >60 06/02/2018 0856   GFRAA 43 (L) 12/09/2018 0455   GFRAA >60 06/02/2018 0856   Lipase     Component Value Date/Time   LIPASE 24 12/02/2018 0355       Studies/Results: Dg Abd Portable 1v  Result Date: 12/08/2018 CLINICAL DATA:  Increased abdominal distention.  Ileus. EXAM: PORTABLE ABDOMEN - 1 VIEW COMPARISON:  CT scan and x-ray dated 12/04/2018 FINDINGS: NG tube tip is in the body of the stomach. No dilated loops of large or small bowel. The contrast from the prior CT scan is now in the ascending and transverse portions of the colon. No acute  bone abnormality. IMPRESSION: No dilated bowel. NG tube in good position. Overall density of the abdomen is consistent with the ascites seen on the prior CT scan. Electronically Signed   By: Lorriane Shire M.D.   On: 12/08/2018 12:24   Vas [place] Lower Extremity Venous (dvt)  Result Date: 12/08/2018  Lower Venous Study Indications: Swelling, and fever.  Limitations: Body habitus, poor ultrasound/tissue interface and foley catheter, in ICU, CRRT. Performing Technologist: Maudry Mayhew MHA, RDMS, RVT, RDCS  Examination Guidelines: A complete evaluation includes B-mode imaging, spectral Doppler, color Doppler, and power Doppler as needed of all accessible portions of each vessel. Bilateral testing is considered an integral part of a complete examination. Limited examinations for reoccurring indications may be  performed as noted.  +---------+---------------+---------+-----------+----------+-------------------+  RIGHT     Compressibility Phasicity Spontaneity Properties Summary              +---------+---------------+---------+-----------+----------+-------------------+  CFV       Full                                                                  +---------+---------------+---------+-----------+----------+-------------------+  SFJ       Full            No        Yes                    pulsatile flow       +---------+---------------+---------+-----------+----------+-------------------+  FV Prox   Full                                                                  +---------+---------------+---------+-----------+----------+-------------------+  FV Mid    Full                                                                  +---------+---------------+---------+-----------+----------+-------------------+  FV Distal Full                                                                  +---------+---------------+---------+-----------+----------+-------------------+  PFV       Full                                                                  +---------+---------------+---------+-----------+----------+-------------------+  POP       Full            No  Yes                    pulsatile flow       +---------+---------------+---------+-----------+----------+-------------------+  PTV                                                        Not adequately                                                                   visualized           +---------+---------------+---------+-----------+----------+-------------------+  PERO                                                       Not adequately                                                                   visualized           +---------+---------------+---------+-----------+----------+-------------------+    +---------+---------------+---------+-----------+----------+--------------+  LEFT      Compressibility Phasicity Spontaneity Properties Summary         +---------+---------------+---------+-----------+----------+--------------+  CFV                                                        Not visualized  +---------+---------------+---------+-----------+----------+--------------+  SFJ                                                        Not visualized  +---------+---------------+---------+-----------+----------+--------------+  FV Prox                                                    Not visualized  +---------+---------------+---------+-----------+----------+--------------+  FV Mid    Full                                                             +---------+---------------+---------+-----------+----------+--------------+  FV Distal Full                                                             +---------+---------------+---------+-----------+----------+--------------+  PFV                                                        Not visualized  +---------+---------------+---------+-----------+----------+--------------+  POP                                                        Not visualized  +---------+---------------+---------+-----------+----------+--------------+  PTV                                                        Not visualized  +---------+---------------+---------+-----------+----------+--------------+  PERO                                                       Not visualized  +---------+---------------+---------+-----------+----------+--------------+     Summary: Right: There is no evidence of deep vein thrombosis in the lower extremity. However, portions of this examination were limited- see technologist comments above. No cystic structure found in the popliteal fossa. Left: There is no evidence of deep vein thrombosis in the lower extremity. However, portions of this examination were limited-  see technologist comments above. No cystic structure found in the popliteal fossa.  *See table(s) above for measurements and observations.    Preliminary    Vas [place] Upper Extremity Venous Duplex  Result Date: 12/08/2018 UPPER VENOUS STUDY  Indications: Swelling, and fever Limitations: Body habitus, poor ultrasound/tissue interface, in ICU, on ventilator, CRRT, foley catheter, bandaging, and musculoskeletal features. Performing Technologist: Maudry Mayhew MHA, RDMS, RVT, RDCS  Examination Guidelines: A complete evaluation includes B-mode imaging, spectral Doppler, color Doppler, and power Doppler as needed of all accessible portions of each vessel. Bilateral testing is considered an integral part of a complete examination. Limited examinations for reoccurring indications may be performed as noted.  Right Findings: +----------+------------+---------+-----------+----------+---------------------+  RIGHT      Compressible Phasicity Spontaneous Properties        Summary         +----------+------------+---------+-----------+----------+---------------------+  Subclavian                                                Unable to visualize                                                               due to bandaging     +----------+------------+---------+-----------+----------+---------------------+  Left Findings: +----------+------------+---------+-----------+----------+-------+  LEFT       Compressible Phasicity Spontaneous Properties Summary  +----------+------------+---------+-----------+----------+-------+  IJV  None                   No                  Acute   +----------+------------+---------+-----------+----------+-------+  Subclavian     None                   No                  Acute   +----------+------------+---------+-----------+----------+-------+  Axillary       None                   No                  Acute   +----------+------------+---------+-----------+----------+-------+  Brachial        Full        Yes        Yes                         +----------+------------+---------+-----------+----------+-------+  Radial         Full                                               +----------+------------+---------+-----------+----------+-------+  Ulnar          Full                                               +----------+------------+---------+-----------+----------+-------+  Cephalic       None                                       Acute   +----------+------------+---------+-----------+----------+-------+  Basilic        Full                                               +----------+------------+---------+-----------+----------+-------+  Summary:  Left: Findings consistent with acute deep vein thrombosis involving the left internal jugular vein, left subclavian vein and left axillary vein. Findings consistent with acute superficial vein thrombosis involving the left cephalic vein.  *See table(s) above for measurements and observations.    Preliminary     Anti-infectives: Anti-infectives (From admission, onward)   Start     Dose/Rate Route Frequency Ordered Stop   12/08/18 1800  vancomycin (VANCOCIN) IVPB 750 mg/150 ml premix  Status:  Discontinued     750 mg 150 mL/hr over 60 Minutes Intravenous Every 48 hours 12/07/18 0822 12/07/18 0903   12/08/18 1000  ceFEPIme (MAXIPIME) 2 g in sodium chloride 0.9 % 100 mL IVPB  Status:  Discontinued     2 g 200 mL/hr over 30 Minutes Intravenous Every 24 hours 12/07/18 0822 12/07/18 0903   12/07/18 2200  ceFEPIme (MAXIPIME) 2 g in sodium chloride 0.9 % 100 mL IVPB     2 g 200 mL/hr over 30 Minutes Intravenous Every 12 hours 12/07/18 0903     12/07/18 1800  vancomycin (VANCOCIN)  IVPB 750 mg/150 ml premix     750 mg 150 mL/hr over 60 Minutes Intravenous Every 24 hours 12/07/18 0903     12/06/18 1800  vancomycin (VANCOCIN) IVPB 750 mg/150 ml premix  Status:  Discontinued     750 mg 150 mL/hr over 60 Minutes Intravenous Every 24 hours 12/05/18 1652  12/07/18 0822   12/05/18 1800  ceFEPIme (MAXIPIME) 2 g in sodium chloride 0.9 % 100 mL IVPB  Status:  Discontinued     2 g 200 mL/hr over 30 Minutes Intravenous Every 12 hours 12/05/18 1644 12/07/18 0822   12/05/18 1700  vancomycin (VANCOCIN) 1,250 mg in sodium chloride 0.9 % 250 mL IVPB     1,250 mg 166.7 mL/hr over 90 Minutes Intravenous  Once 12/05/18 1652 12/05/18 2019   11/24/2018 1800  fluconazole (DIFLUCAN) IVPB 200 mg  Status:  Discontinued     200 mg 100 mL/hr over 60 Minutes Intravenous Every 24 hours 12/01/2018 0748 12/12/2018 0847   12/02/2018 1800  fluconazole (DIFLUCAN) IVPB 400 mg  Status:  Discontinued     400 mg 100 mL/hr over 120 Minutes Intravenous Every 24 hours 12/08/2018 0847 11/15/2018 0856   11/24/2018 1400  piperacillin-tazobactam (ZOSYN) IVPB 2.25 g  Status:  Discontinued     2.25 g 100 mL/hr over 30 Minutes Intravenous Every 6 hours 12/05/2018 0748 12/01/18 1104   12/09/2018 0600  cefoTEtan (CEFOTAN) 2 g in sodium chloride 0.9 % 100 mL IVPB     2 g 200 mL/hr over 30 Minutes Intravenous On call to O.R. 11/22/18 1300 11/29/2018 0624   11/22/18 1800  fluconazole (DIFLUCAN) IVPB 400 mg  Status:  Discontinued     400 mg 100 mL/hr over 120 Minutes Intravenous Every 24 hours 12/07/2018 1528 12/06/2018 0748   11/22/18 1800  vancomycin (VANCOCIN) 1,500 mg in sodium chloride 0.9 % 500 mL IVPB  Status:  Discontinued     1,500 mg 250 mL/hr over 120 Minutes Intravenous Every 24 hours 11/22/18 0358 11/22/18 0750   11/22/18 0115  vancomycin (VANCOCIN) 1,500 mg in sodium chloride 0.9 % 500 mL IVPB     1,500 mg 250 mL/hr over 120 Minutes Intravenous  Once 11/22/18 0101 11/22/18 0319   12/08/2018 1630  fluconazole (DIFLUCAN) IVPB 800 mg     800 mg 100 mL/hr over 240 Minutes Intravenous  Once 12/04/2018 1527 11/26/2018 2013   11/29/2018 1600  piperacillin-tazobactam (ZOSYN) IVPB 3.375 g  Status:  Discontinued     3.375 g 12.5 mL/hr over 240 Minutes Intravenous Every 8 hours 12/09/2018 1522 11/29/2018 0748    12/06/2018 0545  piperacillin-tazobactam (ZOSYN) IVPB 3.375 g     3.375 g 100 mL/hr over 30 Minutes Intravenous  Once 11/30/2018 0530 11/20/2018 0626   11/24/2018 0530  piperacillin-tazobactam (ZOSYN) IVPB 4.5 g  Status:  Discontinued     4.5 g 200 mL/hr over 30 Minutes Intravenous  Once 11/30/2018 0517 12/06/2018 0529       Assessment/Plan HTN HLD DM Hypothyroidism 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 Ninfa Linden- POD #18 2. S/pReexploration of recent laparotomyWITH [facility] OUT (N[facility]9 Dr. Rosendo Gros- POD #16 3. S/pExploratory laparotomy with washout of abdomen and placement of vacuum VAC  dressing6/11 Dr. Brantley Stage- POD #14 4. Exploratory laparotomy with anastomosis of SB and closure of abdominal wall 6/14 Dr. Brantley Stage- POD #11 -stop TFs as patient's abdominal is significantly distended today and hard.  Film from yesterday didn't show ileus, but question if bowel was totally fluid filled.  Will DC TFs for now and resume NGT to LIWS -cont TNA for now -WBC up to 16 from 13.  No fevers.  Will follow.  May be secondary to lines, will follow abdomen given distention -Cont BID NSWD dressing changes to midline wound  ID -diflucan 6/7-6/11, vancomycin 6/8-6/8.zosyn 6/7>>6/21, Cefepime 6/21 --> FEN -IVF, NGT,TNA  VTE -SCDs, heparin gtt Foley -in place Contact - brother GeorgeMattier([phone] or [phone]) - pt is full code   LOS: 18 days    [name] , [facility] 12/09/2018, 8:16 AM Pager: [phone]

## 2018-12-09 NOTE — Progress Notes (Signed)
  [facility] Progress Note   Subjective:     I/O net - 2.3 L yest, min UOP   Objective:   BP 114/61   Pulse (!) 111   Temp 98.4 F (36.9 C)   Resp (!) 35   Ht '5\' 4"'$  (1.626 m)   Wt 77.9 kg   SpO2 100%   BMI 29.48 kg/m   Intake/Output Summary (Last 24 hours) at 12/09/2018 1343 Last data filed at 12/09/2018 1300 Gross per 24 hour  Intake 2807.92 ml  Output 5506 ml  Net -2698.08 ml   Weight change:   Physical Exam: Genintubated NECK: no JVD  PULM: clear bilat CV: RRR no MRG ABD: soft, dressings c/d/i, quiet bowel sounds GUfoley cath minimal urine,  Ext: 1-2+ diffuse edema Neuro: intubated   Assessment/ Plan:     1. Acute renal failure: last known baseline 0.79 05/2018.  She has acute oliguric AKI in the setting of septic shock due to perf bowel likely 2/2/ ATN. SP CRRT 6/9 - 6/12, held after PEA arrest.  Resumed 6/14 after abdominal closure.  - cont CRRT, intermittent HD not advised w/ volume load of TPN - ^UF 75- 150 cc/hr for vol overload  2. Septic shock: appears to have resolved, remains off pressors. s/p IV abx  3. Acute hypoxic respiratory failure: reintubated 6/20, CT head negative  4. Perforated small bowel due to closed-loop SBO / septic shock- per CCM/ gen surg, s/p closure 6/14. NGT in place  5. DM: per primary  6. Takatsubo's cardiomyopathy: EF ~ 20%  7. Severe hypoalbuminemia: per primary-- on TPN  8. SP PEA arrest: on 6/13  Kelly Splinter, MD 12/06/2018, 3:16 PM    Labs: BMET Recent Labs  Lab 12/05/18 1600 12/06/18 0228 12/07/18 0330 12/07/18 1846 12/08/18 0509 12/08/18 1910 12/09/18 0455  NA 131* 134*  135 132* 134* 134* 135 135  K 4.2 4.1  4.1 4.6 4.8 4.8 5.1 5.1  CL 98 101  101 98 99 99 99 99  CO2 '22 24  25 '$ 21* 21* 23 23 21*  GLUCOSE 164* 103*  105* 130* 128* 124* 149* 147*  BUN 53* 44*  47* 90* 87* 79* 67* 69*  CREATININE 1.25* 1.03*  1.09* 2.40* 2.46* 1.84* 1.61* 1.49*  CALCIUM 7.5* 7.5*  7.6* 7.7*  7.6* 7.7* 7.9* 8.2*  PHOS 4.4 3.1  3.2 5.9* 6.2* 4.3 3.6 3.4   CBC Recent Labs  Lab 12/06/18 0228 12/07/18 1021 12/07/18 1736 12/08/18 0509 12/09/18 0455  WBC 24.0* 17.3*  --  14.8* 16.0*  NEUTROABS 21.2*  --   --   --   --   HGB 7.5* 6.6* 8.4* 7.7* 8.1*  HCT 23.0* 19.7* 25.0* 23.7* 24.6*  MCV 90.9 91.6  --  92.6 93.2  PLT 85* 109*  --  83* 111*    Medications:    . chlorhexidine gluconate (MEDLINE KIT)  15 mL Mouth Rinse BID  . Chlorhexidine Gluconate Cloth  6 each Topical Daily  . dorzolamide-timolol  1 drop Both Eyes BID  . insulin aspart  3-9 Units Subcutaneous Q6H  . mouth rinse  15 mL Mouth Rinse 10 times per day  . pantoprazole (PROTONIX) IV  40 mg Intravenous Q24H

## 2018-12-09 NOTE — Progress Notes (Signed)
[place] for IV heparin Indication: IJ DVT  No Known Allergies  Patient Measurements: Height: '5\' 4"'  (162.6 cm) Weight: 176 lb 2.4 oz (79.9 kg) IBW/kg (Calculated) : 54.7 Heparin Dosing Weight: 71 kg  Vital Signs: Temp: 97 F (36.1 C) (06/25 0000) Temp Source: Bladder (06/25 0000) BP: 124/66 (06/25 0000) Pulse Rate: 109 (06/25 0000)  Labs: Recent Labs    12/06/18 0228  12/07/18 1021 12/07/18 1736 12/07/18 1846 12/08/18 0509 12/08/18 1910 12/09/18 0004  HGB 7.5*  --  6.6* 8.4*  --  7.7*  --   --   HCT 23.0*  --  19.7* 25.0*  --  23.7*  --   --   PLT 85*  --  109*  --   --  83*  --   --   APTT  --   --   --   --   --  39*  --   --   HEPARINUNFRC  --   --   --   --   --   --   --  0.22*  CREATININE 1.03*  1.09*   < >  --   --  2.46* 1.84* 1.61*  --    < > = values in this interval not displayed.    Estimated Creatinine Clearance: 36.1 mL/min (A) (by C-G formula based on SCr of 1.61 mg/dL (H)).   Medical History: Past Medical History:  Diagnosis Date  . Diabetes mellitus, type II ([place])   . Hyperlipemia   . Hypertension   . Inflammatory breast cancer ([place]) 10/22/13   right  . Radiation 06/20/14-07/28/14    Medications:  Medications Prior to Admission  Medication Sig Dispense Refill Last Dose  . atorvastatin (LIPITOR) 10 MG tablet Take 10 mg by mouth daily.     Marland Kitchen acetaminophen (TYLENOL) 325 MG tablet Take 650 mg by mouth every 6 (six) hours as needed for mild pain.     . Continuous Blood Gluc Receiver (FREESTYLE LIBRE 14 DAY READER) DEVI daily.     . Continuous Blood Gluc Sensor (FREESTYLE LIBRE 14 DAY SENSOR) MISC Place 1 Device onto the skin every 14 (fourteen) days.     Marland Kitchen glucose blood test strip Use as instructed 100 each 12   . glucose monitoring kit (FREESTYLE) monitoring kit 1 each by Does not apply route as needed for other. Check blood sugars daily. 1 each 0   . Insulin Glargine (LANTUS SOLOSTAR) 100 UNIT/ML Solostar  Pen Inject 10 Units into the skin daily at 10 pm. (Patient not taking: Reported on 11/17/2018) 15 mL 3 Not Taking at Unknown time  . levothyroxine (SYNTHROID) 25 MCG tablet Take 25 mcg by mouth daily.     Marland Kitchen lidocaine-prilocaine (EMLA) cream Apply 1 application topically as needed. (Patient not taking: Reported on 12/03/2018) 30 g 0 Not Taking at Unknown time  . LORazepam (ATIVAN) 0.5 MG tablet Take 1 tablet (0.5 mg total) by mouth at bedtime as needed (Nausea or vomiting). (Patient not taking: Reported on 11/16/2018) 30 tablet 0 Not Taking at Unknown time  . metFORMIN (GLUCOPHAGE-XR) 500 MG 24 hr tablet Take 500 mg by mouth 2 (two) times daily with a meal.     . NOVOLOG MIX 70/30 FLEXPEN (70-30) 100 UNIT/ML FlexPen Inject 20 Units into the muscle 2 (two) times daily with a meal.     . pravastatin (PRAVACHOL) 10 MG tablet Take 10 mg by mouth daily.   0    Scheduled:  .  chlorhexidine gluconate (MEDLINE KIT)  15 mL Mouth Rinse BID  . Chlorhexidine Gluconate Cloth  6 each Topical Daily  . dorzolamide-timolol  1 drop Both Eyes BID  . feeding supplement (PRO-STAT SUGAR FREE 64)  60 mL Per Tube TID  . feeding supplement (VITAL HIGH PROTEIN)  1,000 mL Per Tube Q24H  . insulin aspart  3-9 Units Subcutaneous Q6H  . mouth rinse  15 mL Mouth Rinse 10 times per day  . pantoprazole (PROTONIX) IV  40 mg Intravenous Q24H   Infusions:  .  prismasol BGK 4/2.5 400 mL/hr at 12/08/18 1554  .  prismasol BGK 4/2.5 200 mL/hr at 12/08/18 0643  . sodium chloride Stopped (12/08/18 1844)  . ceFEPime (MAXIPIME) IV Stopped (12/08/18 2228)  . heparin    . prismasol BGK 4/2.5 1,500 mL/hr at 12/08/18 2325  . TPN ADULT (ION) 75 mL/hr at 12/09/18 0000  . vancomycin Stopped (12/08/18 1944)   PRN: sodium chloride, acetaminophen, albuterol, alteplase, fentaNYL (SUBLIMAZE) injection, heparin, hydrALAZINE, metoprolol tartrate, sodium chloride, sodium chloride flush  Assessment: 67 yoF known to pharmacy from TPN and antibiotic  dosing; complicated hospital course including cardiac arrest, intubation, VAP, and CRRT, now further complicated with extensive IJ clot (only L sided catheter removed 6/9). Pharmacy to dose heparin infusion.   Baseline INR, aPTT: baseline INR/aPTT mildly elevated but did have recent acute liver injury  Prior anticoagulation: none  Significant events:  6/24:  CBC: anemia of acute/chronic illness (and likely also d/t CRRT) requiring multiple transfusions; Hgb 8.4 after blood yesterday, now back down to 7.7; Plt also low but improved from last week  No bleeding or infusion issues per nursing, no heparin or other anticoagulation running in CRRT circuit  Today, 6/25  0040 HL = 0.22 below goal, no bleeding or infusion issues per RN.   Goal of Therapy: Heparin level 0.3-0.7 units/ml Monitor platelets by anticoagulation protocol: Yes  Plan:  Omit bolus d/t anemia  Increase  Heparin drip to 1400 units/hr  Recheck HL in 8 hours  Daily CBC, daily heparin level once stable  Monitor for signs of bleeding or thrombosis   Dorrene German 12/09/2018, 1:00 AM

## 2018-12-09 NOTE — Progress Notes (Signed)
NUTRITION NOTE  Able to communicate with Claiborne Billings (Surgery) yesterday about recommendations to replace NGT (placed 6/7) with small bore NGT. Order was in for this to occur this AM. Able to communicate with RN outside of patient's room a few minutes ago. Plan has now changed d/t abdominal distention. Order for small bore NGT has been canceled and NGT now to LIS and TF off d/t distention. TPN continues.    Jarome Matin, MS, RD, LDN, [facility] [place] Inpatient Clinical Dietitian Pager # [phone] After hours/weekend pager # [phone]

## 2018-12-09 NOTE — Progress Notes (Signed)
Verbal consent via telephone obtained from patients brother, Clide Cliff, for exploratory laparotomy with placement of vacuum dressing in open abdomen. Signed form placed in shadow chart.

## 2018-12-09 NOTE — Progress Notes (Signed)
Pt continued to have abdominal distention with persistent grimacing observed throughout the day despite decompression from NG tube. HR  remained ST 120s and Respirations ranged from 30s-40s. Surgery notified and STAT CT abdomen/pelvis ordered. CRRT stopped and blood returned to pt. Pt transported to and from CT with respiratory therapy without any complications. Surgeon to bedside after results called to him. MD discussed results with pts brother and ordered to stop heparin gtt pending surgery in the AM.  Will resume CRRT.

## 2018-12-09 NOTE — Progress Notes (Signed)
[place] for IV heparin Indication: IJ DVT  No Known Allergies  Patient Measurements: Height: _0  (162.6 cm) Weight: 171 lb 11.8 oz (77.9 kg) IBW/kg (Calculated) : 54.7 Heparin Dosing Weight: 71 kg  Vital Signs: Temp: 98.4 F (36.9 C) (06/25 1100) Temp Source: Bladder (06/25 0800) BP: 110/61 (06/25 1100) Pulse Rate: 107 (06/25 1100)  Labs: Recent Labs    12/07/18 1021 12/07/18 1736  12/08/18 0509 12/08/18 1910 12/09/18 0004 12/09/18 0455 12/09/18 0900  HGB 6.6* 8.4*  --  7.7*  --   --  8.1*  --   HCT 19.7* 25.0*  --  23.7*  --   --  24.6*  --   PLT 109*  --   --  83*  --   --  111*  --   APTT  --   --   --  39*  --   --  85*  --   HEPARINUNFRC  --   --   --   --   --  0.22*  --  0.35  CREATININE  --   --    < > 1.84* 1.61*  --  1.49*  --    < > = values in this interval not displayed.    Estimated Creatinine Clearance: 38.5 mL/min (A) (by C-G formula based on SCr of 1.49 mg/dL (H)).   Medical History: Past Medical History:  Diagnosis Date  . Diabetes mellitus, type II ([place])   . Hyperlipemia   . Hypertension   . 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 Radiation 06/20/14-07/28/14    Medications:  Medications Prior to Admission  Medication Sig Dispense Refill Last Dose  . atorvastatin (LIPITOR) 10 MG tablet Take 10 mg by mouth daily.     Marland Kitchen acetaminophen (TYLENOL) 325 MG tablet Take 650 mg by mouth every 6 (six) hours as needed for mild pain.     . Continuous Blood Gluc Receiver (FREESTYLE LIBRE 14 DAY READER) DEVI daily.     . Continuous Blood Gluc Sensor (FREESTYLE LIBRE 14 DAY SENSOR) MISC Place 1 Device onto the skin every 14 (fourteen) days.     Marland Kitchen glucose blood test strip Use as instructed 100 each 12   . glucose monitoring kit (FREESTYLE) monitoring kit 1 each by Does not apply route as needed for other. Check blood sugars daily. 1 each 0   . Insulin Glargine (LANTUS SOLOSTAR) 100 UNIT/ML Solostar Pen  Inject 10 Units into the skin daily at 10 pm. (Patient not taking: Reported on 11/22/2018) 15 mL 3 Not Taking at Unknown time  . levothyroxine (SYNTHROID) 25 MCG tablet Take 25 mcg by mouth daily.     Marland Kitchen lidocaine-prilocaine (EMLA) cream Apply 1 application topically as needed. (Patient not taking: Reported on 11/22/2018) 30 g 0 Not Taking at Unknown time  . LORazepam (ATIVAN) 0.5 MG tablet Take 1 tablet (0.5 mg total) by mouth at bedtime as needed (Nausea or vomiting). (Patient not taking: Reported on 11/29/2018) 30 tablet 0 Not Taking at Unknown time  . metFORMIN (GLUCOPHAGE-XR) 500 MG 24 hr tablet Take 500 mg by mouth 2 (two) times daily with a meal.     . NOVOLOG MIX 70/30 FLEXPEN (70-30) 100 UNIT/ML FlexPen Inject 20 Units into the muscle 2 (two) times daily with a meal.     . pravastatin (PRAVACHOL) 10 MG tablet Take 10 mg by mouth daily.   0    Scheduled:  . chlorhexidine gluconate (  MEDLINE KIT)  15 mL Mouth Rinse BID  . Chlorhexidine Gluconate Cloth  6 each Topical Daily  . dorzolamide-timolol  1 drop Both Eyes BID  . insulin aspart  3-9 Units Subcutaneous Q6H  . mouth rinse  15 mL Mouth Rinse 10 times per day  . pantoprazole (PROTONIX) IV  40 mg Intravenous Q24H   Infusions:  .  prismasol BGK 4/2.5 400 mL/hr at 12/09/18 0435  .  prismasol BGK 4/2.5 200 mL/hr at 12/09/18 0832  . sodium chloride Stopped (12/08/18 1844)  . ceFEPime (MAXIPIME) IV Stopped (12/09/18 0931)  . heparin    . prismasol BGK 4/2.5 1,500 mL/hr at 12/09/18 0944  . TPN ADULT (ION) 75 mL/hr at 12/09/18 1100  . TPN ADULT (ION)     PRN: sodium chloride, acetaminophen, albuterol, alteplase, fentaNYL (SUBLIMAZE) injection, heparin, hydrALAZINE, metoprolol tartrate, sodium chloride, sodium chloride flush  Assessment: 3 yoF known to pharmacy from TPN and antibiotic dosing; complicated hospital course including cardiac arrest, intubation, VAP, and CRRT, now further complicated with extensive IJ clot (only L sided catheter  removed 6/9). Pharmacy to dose heparin infusion.   Baseline INR, aPTT: baseline INR/aPTT mildly elevated but did have recent acute liver injury  Prior anticoagulation: none  Significant events:  Today, 12/09/2018:  CBC: anemia of acute/chronic illness (and likely also d/t CRRT) requiring multiple transfusions; Hgb and Plt low but improved today  Most recent heparin level therapeutic but low end of range on 1400 units/hr  No bleeding or infusion issues per nursing, no heparin or other anticoagulation running in CRRT circuit  Goal of Therapy: Heparin level 0.3-0.7 units/ml Monitor platelets by anticoagulation protocol: Yes  Plan:  Slight increase heparin to 1450 units/hr IV infusion  Check confirmatory heparin level 8 hrs after rate change  Daily CBC, daily heparin level once stable  Monitor for signs of bleeding or thrombosis   Reuel Boom, PharmD, BCPS [phone] 12/09/2018, 11:49 AM

## 2018-12-09 NOTE — Progress Notes (Signed)
NAME:  Carmen Stone, MRN:  [id number], DOB:  04-14-1956, LOS: 7 ADMISSION DATE:  12/03/2018, CONSULTATION DATE:  11/21/2018 REFERRING MD:  Dr. Ninfa Linden, Surgery, CHIEF COMPLAINT:  Abdominal pain   Brief History   63 y/o female presented to ER 11/17/2018 with nausea, vomiting, abdominal pain.  CT abd/pelvis showed focal perforated loop of SB with free air with multiple complex ventral hernias.  Underwent emergent laparotomy with SB resection, lysis of adhesions and wound vac.  Remained on vent and pressors post op.  Hospital course complicated by respiratory arrest 6/13, multiple reintubations, intermittent vasopressor needs.    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 Changing volume goal on CRRT to -100 cc an hour. Weaned 7 hours PSV   6/16 Encephalopathic. Hypertensive, adding labetalol.  IV TNA started 6/17 A little more awake.  Tolerating PSV but SBI exceeds 105.  Volume removal with CVVHD 6/18 Neuro status continues to improve.  Hemodynamics stable however did require brief vasoactive drips during nighttime.  She is now at an net negative fluid volume status.  Likely nearing euvolemic state.  PSV 6/19 Extubated 6/20 reintubated for altered mentation, head CT negative  6/21 Tx PRBC x1.  Off vasopressors 6/23: Febrile overnight, Blood Cx. Transfuse 1 U PRBC 6/24: Afebrile. Remains off pressors. 100cc/hr removal /c CRRT. 30% FiO2.  6/25: L IJ, subclavian, axillary  + DVT. Heparin initiated. Remains on CRRT.   Consults:  PCCM 6/7 Renal 6/09 AKI, acidosis Cardiology  6/09 Takotsubo CM   Procedures:  ETT 6/07 >> 6/19, 6/20 >> Rt IJ HD 6/09 >> Rt [place] CVL 6/09 >> Lt radial aline 6/09 >>Discontinued  Significant Diagnostic Tests:  CT abd/pelvis 6/07 >> focal perforated loop of SB with free air with multiple complex ventral hernias Echo 6/08 >> EF 20%, Takotsubo CM CT head 6/20 >> negative for any acute findings CT abdomen 6/20 >> bilateral effusions, atelectasis left lower lobe, results noted.    CT Chest 6/20 >> small bilateral pleural effusions with compressive atelectasis, anterior R 4-7 rib fractures CT ABD/Pelvis 6/20 >> small bowel surgical changes, mild circumferential wall thickening of scattered small bowel loops, small to moderate ascites LUE Duplex 6/24>>DVT L IJ, subclavian, axillary, SVT L cephalic   Micro Data:  COVID 6/07 >> negative Tracheal aspirate 6/20 >> E Coli Blood Cx 6/23 >>NGTD  Antimicrobials:  Zosyn 6/07 >> 6/17 Diflucan 6/07 >> 6/10 Cefepime 6/21 >> Vanco 6/21 >>6/25  Interim history/subjective:  L IJ DVT, heparin gtt initiated. Bilious drainage from NGT. TF stopped d/t NGT output.  Remains oliguric. Required analgesia overnight as she was c/o ABD pain.    Objective   Blood pressure 124/61, pulse (!) 120, temperature 97.9 F (36.6 C), resp. rate (!) 35, height 5\' 4"  (1.626 m), weight 77.9 kg, SpO2 100 %.    Vent Mode: PRVC FiO2 (%):  [30 %] 30 % Set Rate:  [18 bmp] 18 bmp Vt Set:  [430 mL] 430 mL PEEP:  [5 cmH20] 5 cmH20 Plateau Pressure:  [25 cmH20-34 cmH20] 30 cmH20   Intake/Output Summary (Last 24 hours) at  12/09/2018 0820 Last data filed at 12/09/2018 0800 Gross per 24 hour  Intake 2859.84 ml  Output 5223 ml  Net -2363.16 ml   Filed Weights   12/06/18 0231 12/07/18 0334 12/09/18 0500  Weight: 76.9 kg 79.9 kg 77.9 kg    Examination: General: well developed, critically ill appearing, grimacing  Neuro: moving all EXT spontaneously, follows commands in lower EXT only  CV: RRR. S1 S2. No MRG.  PULM:  BBS faint rhonchit upper lobes, tachypneic at times, symmetrical. Vent supported. Thin white secretions.  GI: ABD dressing CDI. Minimal BS x4. Rounded, firm, distended. ND.  Extremities: MAE well but weak. LUE +2 edema, trace edema to RUE. Trace edema noted to thighs. No edema B/L calves to feet  Skin: PWD. L arm mepilex dressings x 4. Post surfaces not visualized.   Resolved Hospital Problem list   Metabolic alkalosis Abdominal peritonitis with septic shock Circulatory shock, mix of cardiogenic and sepsis off pressors as of 6/15 Cardiorespiratory arrest 6/13 Shock liver Acute metabolic encephalopathy Hypophosphatemia  Assessment & Plan:   Acute Metabolic Encephalopathy -Head CT negative, similar unexplained events during this admission  -no recent sedation or analgesia -Suspect multifactorial in setting of critical illness and protracted course  -She is less alert today /p pain meds P: -neuro checks -encourage sleep/wake cycle -delirium precautions  Fever: -suspect 2/2 DVT and possibly VAP  -Has been Tx for E coli in sputum -Blood Cx NGTD P: -Heparin infusion for DVT -D/c Vanc -continue Cefepime 5 days total-end date 6/26. -Unable to d/c R Subclavian CVC d/t L IJ clot  -Porta cath continued for TPN infusing -when CRRT set goes down and/or pt may transition off CRRT will plan for tunneled HD cath-per neph will continue CRRT at least while pt requiring TPN -Continue foley for I/O and  Nephrology request to keep  to monitor for renal recovery. -Follow fever curve/WBC   Small Bowel Perforation s/p Resection, Lysis of Adhesions -reexploration 6/14, creation of functional end to end small bowel anastomosis and closure of abdominal wound -bilious output overnight with ABD distension P: -TF d/c by surgery -continue TPN -remaining plan per surgery  Anemia -H&H continues to be labile /s overt bleeding and is improved today -suspect anemia of critical illness and nutritional   P: -continue to trend CBC, transfuse if Hgb < 7 -continue to monitor for SnSx bleeding    Acute Hypoxic Respiratory Failure  P: -Continue full vent support -mental status is improving but continues to wax and wane -SBT as able -if fails SBT/weaning would favor trach sooner than later as this is her 3rd intubation   -am ABG  Atelectasis / Pleural Effusion -likely related to complicated abdominal process  -CXR 6/23 improved  -30% FiO2 P: -CXR prn -continue abx as above -bronchial hygiene, bronchodilator protocol   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%, Takotsubo Cardiomyopathy P: -continue to hold home meds for now: Lisinopril and pravachol -PRN hydralazine  -Will need follow up ECHO -volume removal per neph   Acute kidney injury from tubular necrosis -baseline creatinine of 0.73 from 12/12/2018 P: -continue CRRT per neph with 100cc/hr volume removal -will need tunneled cath-see above  -renal labs per CRRT protocol   Type 2 Diabetes -GLU well controlled 127-147 past 24 hrs, minimal SSI needed P: -continue SSI- Q6H   Thrombocytopenia secondary to sepsis, medications and consumptive  -improved P: -Follow CBC   Moderate Protein Calorie Malnutrition  P: -TF off as noted above -continue TPN per pharmacy for  now -TPN labs per protocol   Acute DVT- Left IJ, subclavian, axillary  P: -Heparin infusion per pharmacy    Difficult IV Access P: -Left chest portcath accessed -Unable to d/c subclavian CVC as no further access options at this time d/t LIJ DVT, existing R IJ and R SCL CVC. Groins high risk for infection and cannot place PICC -HD cath as noted above   Best practice:  Diet: TPN. DVT prophylaxis: SCDs, therapeutic heparin GI prophylaxis: Protonix Mobility: Bedrest Code Status: Full code Disposition: continue ICU. [facility]ing on bed at [facility].   Labs:   CMP Latest Ref Rng & Units 12/09/2018 12/08/2018 12/08/2018  Glucose 70 - 99 mg/dL 147(H) 149(H)  124(H)  BUN 8 - 23 mg/dL 69(H) 67(H) 79(H)  Creatinine 0.44 - 1.00 mg/dL 1.49(H) 1.61(H) 1.84(H)  Sodium 135 - 145 mmol/L 135 135 134(L)  Potassium 3.5 - 5.1 mmol/L 5.1 5.1 4.8  Chloride 98 - 111 mmol/L 99 99 99  CO2 22 - 32 mmol/L 21(L) 23 23  Calcium 8.9 - 10.3 mg/dL 8.2(L) 7.9(L) 7.7(L)  Total Protein 6.5 - 8.1 g/dL 7.0 - -  Total Bilirubin 0.3 - 1.2 mg/dL 0.9 - -  Alkaline Phos 38 - 126 U/L 173(H) - -  AST 15 - 41 U/L 28 - -  ALT 0 - 44 U/L 39 - -   CBC Latest Ref Rng & Units 12/09/2018 12/08/2018 12/07/2018  WBC 4.0 - 10.5 K/uL 16.0(H) 14.8(H) -  Hemoglobin 12.0 - 15.0 g/dL 8.1(L) 7.7(L) 8.4(L)  Hematocrit 36.0 - 46.0 % 24.6(L) 23.7(L) 25.0(L)  Platelets 150 - 400 K/uL 111(L) 83(L) -   ABG-no recent   CBG (last 3)  Recent Labs    12/08/18 1801 12/09/18 0003 12/09/18 0525  GLUCAP 134* 135* 127*

## 2018-12-09 NOTE — Progress Notes (Signed)
Assessment & Plan: Probable perforated viscus vs anastomotic leak Sepsis  - Clinical history reviewed with Dr. Marlou Starks, medical record reviewed, operative procedures reviewed. - Patient seen and examined.  Discussed with ICU nursing staff at the bedside. - CT scan from this afternoon reviewed and discussed with radiologist. - Telephone call to patient's brother, [name], to discuss findings and options for management.  He wishes continued aggressive surgical care and agrees to exploratory laparotomy.  I discussed the marked increase risk of the procedure given her prior surgeries, the timing of this procedure 12 days after her last operation, the possibility of further bowel injury and fistula formation, and the likelihood of leaving the abdomen open with a vacuum dressing in place requiring multiple trips to the operating room in the future.  He understands and wishes for [place] to proceed. - Patient fully anticoagulated on heparin for DVT.  Heparin stopped just before 7 PM at my request. - will check lactate level now - last was done on 6/14 - continue abx coverage as currently in place - CRRT per renal - plan laparotomy 12 hours after stopping heparin - 7 AM on 6/26 - will discuss with Dr. Marlou Starks. - orders entered and OR notified        [name], MD       [facility]       Office: [phone]   Chief Complaint: Perforated viscus  Subjective: Patient in ICU, vent, sedated.  Does not answer questions. Nurse at bedside.  Objective: Vital signs in last 24 hours: Temp:  [97 F (36.1 C)-100 F (37.8 C)] 100 F (37.8 C) (06/25 1800) Pulse Rate:  [103-126] 121 (06/25 1800) Resp:  [22-42] 30 (06/25 1800) BP: (102-144)/(50-73) 102/56 (06/25 1800) SpO2:  [100 %] 100 % (06/25 1800) FiO2 (%):  [30 %-100 %] 30 % (06/25 1719) Weight:  [77.9 kg] 77.9 kg (06/25 0500) Last BM Date: 12/09/18  Intake/Output from previous day: 06/24 0701 - 06/25 0700 In: 2840.9  [I.V.:2107.3; NG/GT:494.6; IV Piggyback:238.5] Out: 4742 [Urine:36] Intake/Output this shift: No intake/output data recorded.  Physical Exam: HEENT - sclerae clear, mucous membranes moist Neck - soft; IJ lines in place Abdomen - moderate distension, tense, quiet; intact dressing in midline Ext - no edema at ankles  Lab Results:  Recent Labs    12/08/18 0509 12/09/18 0455  WBC 14.8* 16.0*  HGB 7.7* 8.1*  HCT 23.7* 24.6*  PLT 83* 111*   BMET Recent Labs    12/09/18 0455 12/09/18 1558  NA 135 136  K 5.1 5.0  CL 99 100  CO2 21* 25  GLUCOSE 147* 126*  BUN 69* 68*  CREATININE 1.49* 1.61*  CALCIUM 8.2* 8.2*   PT/INR No results for input(s): LABPROT, INR in the last 72 hours. Comprehensive Metabolic Panel:    Component Value Date/Time   NA 136 12/09/2018 1558   NA 135 12/09/2018 0455   NA 138 06/01/2017 0859   NA 135 (L) 11/27/2016 0952   K 5.0 12/09/2018 1558   K 5.1 12/09/2018 0455   K 4.2 06/01/2017 0859   K 4.3 11/27/2016 0952   CL 100 12/09/2018 1558   CL 99 12/09/2018 0455   CO2 25 12/09/2018 1558   CO2 21 (L) 12/09/2018 0455   CO2 24 06/01/2017 0859   CO2 27 11/27/2016 0952   BUN 68 (H) 12/09/2018 1558   BUN 69 (H) 12/09/2018 0455   BUN 15.2 06/01/2017 0859   BUN 13.0 11/27/2016  5397   CREATININE 1.61 (H) 12/09/2018 1558   CREATININE 1.49 (H) 12/09/2018 0455   CREATININE 0.79 06/02/2018 0856   CREATININE 0.7 06/01/2017 0859   CREATININE 1.1 11/27/2016 0952   GLUCOSE 126 (H) 12/09/2018 1558   GLUCOSE 147 (H) 12/09/2018 0455   GLUCOSE 118 06/01/2017 0859   GLUCOSE 550 Repeated and Verified (H) 11/27/2016 0952   CALCIUM 8.2 (L) 12/09/2018 1558   CALCIUM 8.2 (L) 12/09/2018 0455   CALCIUM 9.0 06/01/2017 0859   CALCIUM 9.2 11/27/2016 0952   AST 28 12/09/2018 0455   AST 32 12/06/2018 0228   AST 13 (L) 06/02/2018 0856   AST 14 06/01/2017 0859   AST 16 11/27/2016 0952   ALT 39 12/09/2018 0455   ALT 53 (H) 12/06/2018 0228   ALT 16 06/02/2018 0856    ALT 12 06/01/2017 0859   ALT 18 11/27/2016 0952   ALKPHOS 173 (H) 12/09/2018 0455   ALKPHOS 285 (H) 12/06/2018 0228   ALKPHOS 97 06/01/2017 0859   ALKPHOS 166 (H) 11/27/2016 0952   BILITOT 0.9 12/09/2018 0455   BILITOT 1.2 12/06/2018 0228   BILITOT 0.3 06/02/2018 0856   BILITOT 0.22 06/01/2017 0859   BILITOT 0.31 11/27/2016 0952   PROT 7.0 12/09/2018 0455   PROT 5.8 (L) 12/06/2018 0228   PROT 7.0 06/01/2017 0859   PROT 6.7 11/27/2016 0952   ALBUMIN 1.9 (L) 12/09/2018 1558   ALBUMIN 1.7 (L) 12/09/2018 0455   ALBUMIN 3.7 06/01/2017 0859   ALBUMIN 3.3 (L) 11/27/2016 0952    Studies/Results: Ct Abdomen Pelvis Wo Contrast  Result Date: 12/09/2018 CLINICAL DATA:  History of small bowel perforation. Now with abdominal distention and abdominal pain. EXAM: CT ABDOMEN AND PELVIS WITHOUT CONTRAST TECHNIQUE: Multidetector CT imaging of the abdomen and pelvis was performed following the standard protocol without IV contrast. COMPARISON:  12/04/2018 FINDINGS: Lower chest: Central line tip projects in the upper right atrium. Airspace consolidation noted posterior right lower lobe with collapse/consolidation in the posterior left lower lobe. No substantial pleural effusion. Hepatobiliary: No focal abnormality in the liver on this study without intravenous contrast. Gallbladder is surgically absent. No intrahepatic or extrahepatic biliary dilation. Pancreas: No focal mass lesion. No dilatation of the main duct. No intraparenchymal cyst. No peripancreatic edema. Spleen: No splenomegaly. No focal mass lesion. Adrenals/Urinary Tract: No adrenal nodule or mass. Kidneys unremarkable on this noncontrast study. No hydroureteronephrosis. Foley catheter decompresses the urinary bladder. Stomach/Bowel: NG tube decompresses the stomach. Duodenum is normally positioned as is the ligament of Treitz. Circumferential wall thickening is noted in proximal jejunal loops in the left upper quadrant (image 40/series 2). Small  bowel anastomosis noted anterior left paramidline abdomen. No small bowel dilatation. Small diverticulum noted on the terminal ileum. 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 Vascular/Lymphatic: No abdominal aortic aneurysm. No abdominal lymphadenopathy Reproductive: The uterus is surgically absent. There is no adnexal mass. Other: Interval development of large volume free fluid in the abdomen and pelvis. This is associated with intraperitoneal free air anteriorly under the rectus sheath and bubbles of gas scattered in the central mesentery. Source for the fluid and gas is not readily evident, but anastomotic etiology would be a likely consideration. Musculoskeletal: Some of the free intraperitoneal air has dissected through the anterior midline fascia into the subcutaneous fat of the anterior abdominal wall. Open midline surgical wound noted. Degenerative changes noted in the right hip. No worrisome lytic or sclerotic osseous abnormality. IMPRESSION: 1. Since the study from 5  days ago, the amount of intraperitoneal free fluid has progressed substantially with large volume ascites now evident. This is associated with prominent intraperitoneal free air collecting under the anterior abdominal wall with gas scattered in the central small bowel mesentery. Etiology for the fluid and gas not definitely identified. Mild circumferential wall thickening is noted in jejunal loops, proximal to the anastomosis. 2. Patchy airspace disease in the right lower lobe is similar. The left lower lobe collapse seen on the previous study has improved significantly. Critical Value/emergent results were called by telephone at the time of interpretation on 12/09/2018 at 6:03 pm to Dr. [name], who verbally acknowledged these results. Electronically Signed   By: Misty Stanley M.D.   On: 12/09/2018 18:04   Dg Abd Portable 1v  Result Date: 12/08/2018 CLINICAL DATA:  Increased abdominal distention.   Ileus. EXAM: PORTABLE ABDOMEN - 1 VIEW COMPARISON:  CT scan and x-ray dated 12/04/2018 FINDINGS: NG tube tip is in the body of the stomach. No dilated loops of large or small bowel. The contrast from the prior CT scan is now in the ascending and transverse portions of the colon. No acute bone abnormality. IMPRESSION: No dilated bowel. NG tube in good position. Overall density of the abdomen is consistent with the ascites seen on the prior CT scan. Electronically Signed   By: Lorriane Shire M.D.   On: 12/08/2018 12:24   Vas [place] Lower Extremity Venous (dvt)  Result Date: 12/09/2018  Lower Venous Study Indications: Swelling, and fever.  Limitations: Body habitus, poor ultrasound/tissue interface and foley catheter, in ICU, CRRT. Comparison Study: No prior study. Performing Technologist: Maudry Mayhew MHA, RDMS, RVT, RDCS  Examination Guidelines: A complete evaluation includes B-mode imaging, spectral Doppler, color Doppler, and power Doppler as needed of all accessible portions of each vessel. Bilateral testing is considered an integral part of a complete examination. Limited examinations for reoccurring indications may be performed as noted.  +---------+---------------+---------+-----------+----------+-------------------+  RIGHT     Compressibility Phasicity Spontaneity Properties Summary              +---------+---------------+---------+-----------+----------+-------------------+  CFV       Full                                                                  +---------+---------------+---------+-----------+----------+-------------------+  SFJ       Full            No        Yes                    pulsatile flow       +---------+---------------+---------+-----------+----------+-------------------+  FV Prox   Full                                                                  +---------+---------------+---------+-----------+----------+-------------------+  FV Mid    Full                                                                   +---------+---------------+---------+-----------+----------+-------------------+  FV Distal Full                                                                  +---------+---------------+---------+-----------+----------+-------------------+  PFV       Full                                                                  +---------+---------------+---------+-----------+----------+-------------------+  POP       Full            No        Yes                    pulsatile flow       +---------+---------------+---------+-----------+----------+-------------------+  PTV                                                        Not adequately                                                                   visualized           +---------+---------------+---------+-----------+----------+-------------------+  PERO                                                       Not adequately                                                                   visualized           +---------+---------------+---------+-----------+----------+-------------------+   +---------+---------------+---------+-----------+----------+--------------+  LEFT      Compressibility Phasicity Spontaneity Properties Summary         +---------+---------------+---------+-----------+----------+--------------+  CFV                                                        Not visualized  +---------+---------------+---------+-----------+----------+--------------+  SFJ  Not visualized  +---------+---------------+---------+-----------+----------+--------------+  FV Prox                                                    Not visualized  +---------+---------------+---------+-----------+----------+--------------+  FV Mid    Full                                                             +---------+---------------+---------+-----------+----------+--------------+  FV Distal Full                                                              +---------+---------------+---------+-----------+----------+--------------+  PFV                                                        Not visualized  +---------+---------------+---------+-----------+----------+--------------+  POP                                                        Not visualized  +---------+---------------+---------+-----------+----------+--------------+  PTV                                                        Not visualized  +---------+---------------+---------+-----------+----------+--------------+  PERO                                                       Not visualized  +---------+---------------+---------+-----------+----------+--------------+     Summary: Right: There is no evidence of deep vein thrombosis in the lower extremity. However, portions of this examination were limited- see technologist comments above. No cystic structure found in the popliteal fossa. Left: There is no evidence of deep vein thrombosis in the lower extremity. However, portions of this examination were limited- see technologist comments above. No cystic structure found in the popliteal fossa.  *See table(s) above for measurements and observations. Electronically signed by Servando Snare MD on 12/09/2018 at 1:24:45 PM.    Final    Vas [place] Upper Extremity Venous Duplex  Result Date: 12/09/2018 UPPER VENOUS STUDY  Indications: Swelling, and fever Limitations: Body habitus, poor ultrasound/tissue interface, in ICU, on ventilator, CRRT, bandaging, and musculoskeletal features. Comparison Study: No prior study Performing Technologist: Maudry Mayhew MHA, RDMS, RVT, RDCS  Examination Guidelines: A complete evaluation includes B-mode imaging, spectral Doppler, color Doppler, and power Doppler as needed of all  accessible portions of each vessel. Bilateral testing is considered an integral part of a complete examination. Limited examinations for reoccurring indications  may be performed as noted.  Right Findings: +----------+------------+---------+-----------+----------+---------------------+  RIGHT      Compressible Phasicity Spontaneous Properties        Summary         +----------+------------+---------+-----------+----------+---------------------+  Subclavian                                                Unable to visualize                                                               due to bandaging     +----------+------------+---------+-----------+----------+---------------------+  Left Findings: +----------+------------+---------+-----------+----------+-------+  LEFT       Compressible Phasicity Spontaneous Properties Summary  +----------+------------+---------+-----------+----------+-------+  IJV            None                   No                  Acute   +----------+------------+---------+-----------+----------+-------+  Subclavian     None                   No                  Acute   +----------+------------+---------+-----------+----------+-------+  Axillary       None                   No                  Acute   +----------+------------+---------+-----------+----------+-------+  Brachial       Full        Yes        Yes                         +----------+------------+---------+-----------+----------+-------+  Radial         Full                                               +----------+------------+---------+-----------+----------+-------+  Ulnar          Full                                               +----------+------------+---------+-----------+----------+-------+  Cephalic       None                                       Acute   +----------+------------+---------+-----------+----------+-------+  Basilic        Full                                               +----------+------------+---------+-----------+----------+-------+  Summary:  Left: Findings consistent with acute deep vein thrombosis involving the left internal jugular vein, left subclavian  vein and left axillary vein. Findings consistent with acute superficial vein thrombosis involving the left cephalic vein.  *See table(s) above for measurements and observations.  Diagnosing physician: Servando Snare MD Electronically signed by Servando Snare MD on 12/09/2018 at 1:21:38 PM.    Final       [name] 12/09/2018  Patient ID: Carmen Stone, female   DOB: 1955/11/20, 63 y.o.   MRN: [id number]

## 2018-12-10 ENCOUNTER — Inpatient Hospital Stay (HOSPITAL_COMMUNITY): Payer: BLUE CROSS/BLUE SHIELD | Admitting: Anesthesiology

## 2018-12-10 ENCOUNTER — Encounter (HOSPITAL_COMMUNITY): Payer: Self-pay | Admitting: Anesthesiology

## 2018-12-10 ENCOUNTER — Encounter (HOSPITAL_COMMUNITY): Admission: EM | Disposition: E | Payer: Self-pay | Source: Home / Self Care

## 2018-12-10 DIAGNOSIS — K631 Perforation of intestine (nontraumatic): Secondary | ICD-10-CM

## 2018-12-10 HISTORY — PX: LAPAROTOMY: SHX154

## 2018-12-10 HISTORY — PX: APPLICATION OF WOUND VAC: SHX5189

## 2018-12-10 LAB — GLUCOSE, CAPILLARY
Glucose-Capillary: 101 mg/dL — ABNORMAL HIGH (ref 70–99)
Glucose-Capillary: 124 mg/dL — ABNORMAL HIGH (ref 70–99)
Glucose-Capillary: 76 mg/dL (ref 70–99)

## 2018-12-10 LAB — CBC
HCT: 23 % — ABNORMAL LOW (ref 36.0–46.0)
HCT: 23.3 % — ABNORMAL LOW (ref 36.0–46.0)
Hemoglobin: 7.5 g/dL — ABNORMAL LOW (ref 12.0–15.0)
Hemoglobin: 7.5 g/dL — ABNORMAL LOW (ref 12.0–15.0)
MCH: 30.9 pg (ref 26.0–34.0)
MCH: 30.6 pg (ref 26.0–34.0)
MCHC: 32.6 g/dL (ref 30.0–36.0)
MCHC: 32.2 g/dL (ref 30.0–36.0)
MCV: 94.7 fL (ref 80.0–100.0)
MCV: 95.1 fL (ref 80.0–100.0)
Platelets: 122 10*3/uL — ABNORMAL LOW (ref 150–400)
Platelets: 108 10*3/uL — ABNORMAL LOW (ref 150–400)
RBC: 2.43 MIL/uL — ABNORMAL LOW (ref 3.87–5.11)
RBC: 2.45 MIL/uL — ABNORMAL LOW (ref 3.87–5.11)
RDW: 17.6 % — ABNORMAL HIGH (ref 11.5–15.5)
RDW: 17 % — ABNORMAL HIGH (ref 11.5–15.5)
WBC: 15.2 10*3/uL — ABNORMAL HIGH (ref 4.0–10.5)
WBC: 12.8 10*3/uL — ABNORMAL HIGH (ref 4.0–10.5)
nRBC: 0.4 % — ABNORMAL HIGH (ref 0.0–0.2)
nRBC: 0 % (ref 0.0–0.2)

## 2018-12-10 LAB — RENAL FUNCTION PANEL
Albumin: 1.7 g/dL — ABNORMAL LOW (ref 3.5–5.0)
Albumin: 1.4 g/dL — ABNORMAL LOW (ref 3.5–5.0)
Anion gap: 13 (ref 5–15)
Anion gap: 16 — ABNORMAL HIGH (ref 5–15)
BUN: 74 mg/dL — ABNORMAL HIGH (ref 8–23)
BUN: 93 mg/dL — ABNORMAL HIGH (ref 8–23)
CO2: 24 mmol/L (ref 22–32)
CO2: 20 mmol/L — ABNORMAL LOW (ref 22–32)
Calcium: 7.9 mg/dL — ABNORMAL LOW (ref 8.9–10.3)
Calcium: 7.6 mg/dL — ABNORMAL LOW (ref 8.9–10.3)
Chloride: 100 mmol/L (ref 98–111)
Chloride: 100 mmol/L (ref 98–111)
Creatinine, Ser: 1.6 mg/dL — ABNORMAL HIGH (ref 0.44–1.00)
Creatinine, Ser: 2.25 mg/dL — ABNORMAL HIGH (ref 0.44–1.00)
GFR calc Af Amer: 39 mL/min — ABNORMAL LOW (ref >60)
GFR calc Af Amer: 26 mL/min — ABNORMAL LOW (ref >60)
GFR calc non Af Amer: 34 mL/min — ABNORMAL LOW (ref >60)
GFR calc non Af Amer: 22 mL/min — ABNORMAL LOW (ref >60)
Glucose, Bld: 116 mg/dL — ABNORMAL HIGH (ref 70–99)
Glucose, Bld: 102 mg/dL — ABNORMAL HIGH (ref 70–99)
Phosphorus: 3.7 mg/dL (ref 2.5–4.6)
Phosphorus: 5.7 mg/dL — ABNORMAL HIGH (ref 2.5–4.6)
Potassium: 4.8 mmol/L (ref 3.5–5.1)
Potassium: 5.1 mmol/L (ref 3.5–5.1)
Sodium: 137 mmol/L (ref 135–145)
Sodium: 136 mmol/L (ref 135–145)

## 2018-12-10 LAB — PREPARE RBC (CROSSMATCH)

## 2018-12-10 LAB — LACTIC ACID, PLASMA: Lactic Acid, Venous: 1.6 mmol/L (ref 0.5–1.9)

## 2018-12-10 LAB — MAGNESIUM: Magnesium: 2.5 mg/dL — ABNORMAL HIGH (ref 1.7–2.4)

## 2018-12-10 SURGERY — LAPAROTOMY, EXPLORATORY
Anesthesia: General | Site: Abdomen

## 2018-12-10 MED ORDER — FENTANYL CITRATE (PF) 250 MCG/5ML IJ SOLN
INTRAMUSCULAR | Status: AC
  Filled 2018-12-10: qty 5

## 2018-12-10 MED ORDER — NOREPINEPHRINE 4 MG/250ML-% IV SOLN
0.0000 ug/min | INTRAVENOUS | Status: DC
  Administered 2018-12-10: 2 ug/min via INTRAVENOUS
  Administered 2018-12-11: 10 ug/min via INTRAVENOUS
  Administered 2018-12-12 – 2018-12-13 (×2): 3 ug/min via INTRAVENOUS
  Filled 2018-12-10 (×6): qty 250

## 2018-12-10 MED ORDER — PIPERACILLIN-TAZOBACTAM IN DEX 2-0.25 GM/50ML IV SOLN
2.2500 g | Freq: Four times a day (QID) | INTRAVENOUS | Status: DC
  Administered 2018-12-10 – 2018-12-11 (×2): 2.25 g via INTRAVENOUS
  Filled 2018-12-10 (×3): qty 50

## 2018-12-10 MED ORDER — PROPOFOL 10 MG/ML IV BOLUS
INTRAVENOUS | Status: AC
  Filled 2018-12-10: qty 20

## 2018-12-10 MED ORDER — HEPARIN (PORCINE) 25000 UT/250ML-% IV SOLN
1450.0000 [IU]/h | INTRAVENOUS | Status: DC
  Administered 2018-12-10: 1450 [IU]/h via INTRAVENOUS
  Filled 2018-12-10: qty 250

## 2018-12-10 MED ORDER — 0.9 % SODIUM CHLORIDE (POUR BTL) OPTIME
TOPICAL | Status: DC | PRN
  Administered 2018-12-10: 08:00:00 5000 mL

## 2018-12-10 MED ORDER — ROCURONIUM BROMIDE 10 MG/ML (PF) SYRINGE
PREFILLED_SYRINGE | INTRAVENOUS | Status: DC | PRN
  Administered 2018-12-10: 100 mg via INTRAVENOUS

## 2018-12-10 MED ORDER — FENTANYL CITRATE (PF) 100 MCG/2ML IJ SOLN
INTRAMUSCULAR | Status: DC | PRN
  Administered 2018-12-10: 50 ug via INTRAVENOUS

## 2018-12-10 MED ORDER — HEPARIN SODIUM (PORCINE) 1000 UNIT/ML DIALYSIS
1000.0000 [IU] | INTRAMUSCULAR | Status: DC | PRN
  Administered 2018-12-10: 10:00:00 2400 [IU] via INTRAVENOUS_CENTRAL
  Administered 2018-12-10: 3000 [IU] via INTRAVENOUS_CENTRAL
  Administered 2018-12-10: 12:00:00 2400 [IU] via INTRAVENOUS_CENTRAL
  Administered 2018-12-13: 09:00:00 3000 [IU] via INTRAVENOUS_CENTRAL
  Administered 2018-12-17: 2000 [IU] via INTRAVENOUS_CENTRAL
  Filled 2018-12-10: qty 6
  Filled 2018-12-10: qty 3
  Filled 2018-12-10 (×4): qty 6
  Filled 2018-12-10: qty 3
  Filled 2018-12-10: qty 6

## 2018-12-10 MED ORDER — SODIUM CHLORIDE 0.9 % IV SOLN
INTRAVENOUS | Status: DC | PRN
  Administered 2018-12-10: 20 ug/min via INTRAVENOUS

## 2018-12-10 MED ORDER — TRACE MINERALS CR-CU-MN-SE-ZN 10-1000-500-60 MCG/ML IV SOLN
INTRAVENOUS | Status: AC
  Administered 2018-12-10: 17:00:00 via INTRAVENOUS
  Filled 2018-12-10: qty 1140

## 2018-12-10 MED ORDER — HEPARIN SOD (PORK) LOCK FLUSH 100 UNIT/ML IV SOLN
500.0000 [IU] | Freq: Once | INTRAVENOUS | Status: DC
  Filled 2018-12-10 (×2): qty 5

## 2018-12-10 MED ORDER — SODIUM CHLORIDE 0.9% IV SOLUTION: Freq: Once | INTRAVENOUS | Status: AC

## 2018-12-10 MED ORDER — PIPERACILLIN-TAZOBACTAM 3.375 G IVPB 30 MIN
3.3750 g | Freq: Once | INTRAVENOUS | Status: AC
  Administered 2018-12-10: 3.375 g via INTRAVENOUS
  Filled 2018-12-10 (×2): qty 50

## 2018-12-10 MED ORDER — SODIUM CHLORIDE 0.9 % IV SOLN
100.0000 mg | INTRAVENOUS | Status: DC
  Administered 2018-12-11 – 2018-12-19 (×9): 100 mg via INTRAVENOUS
  Filled 2018-12-10 (×10): qty 100

## 2018-12-10 MED ORDER — SODIUM CHLORIDE 0.9 % IV SOLN
200.0000 mg | Freq: Once | INTRAVENOUS | Status: AC
  Administered 2018-12-10: 200 mg via INTRAVENOUS
  Filled 2018-12-10: qty 200

## 2018-12-10 SURGICAL SUPPLY — 46 items
APL PRP STRL LF DISP 70% ISPRP (MISCELLANEOUS) ×1
APL SWBSTK 6 STRL LF DISP (MISCELLANEOUS) ×1
APPLICATOR COTTON TIP 6 STRL (MISCELLANEOUS) ×1 IMPLANT
APPLICATOR COTTON TIP 6IN STRL (MISCELLANEOUS) ×3
BLADE EXTENDED COATED 6.5IN (ELECTRODE) IMPLANT
BLADE HEX COATED 2.75 (ELECTRODE) ×3 IMPLANT
CHLORAPREP W/TINT 26 (MISCELLANEOUS) ×3 IMPLANT
COVER MAYO STAND STRL (DRAPES) IMPLANT
COVER SURGICAL LIGHT HANDLE (MISCELLANEOUS) ×3 IMPLANT
COVER WAND RF STERILE (DRAPES) IMPLANT
DRAPE LAPAROSCOPIC ABDOMINAL (DRAPES) ×3 IMPLANT
DRAPE WARM FLUID 44X44 (DRAPES) IMPLANT
ELECT REM PT RETURN 15FT ADLT (MISCELLANEOUS) ×3 IMPLANT
GAUZE SPONGE 4X4 12PLY STRL (GAUZE/BANDAGES/DRESSINGS) ×3 IMPLANT
GLOVE BIOGEL PI IND STRL 7.0 (GLOVE) ×1 IMPLANT
GLOVE BIOGEL PI INDICATOR 7.0 (GLOVE) ×2
GLOVE SURG ORTHO 8.0 STRL STRW (GLOVE) ×3 IMPLANT
GOWN STRL REUS W/TWL LRG LVL3 (GOWN DISPOSABLE) ×3 IMPLANT
GOWN STRL REUS W/TWL XL LVL3 (GOWN DISPOSABLE) ×6 IMPLANT
HANDLE SUCTION POOLE (INSTRUMENTS) IMPLANT
KIT BASIN OR (CUSTOM PROCEDURE TRAY) ×3 IMPLANT
KIT TURNOVER KIT A (KITS) IMPLANT
LIGASURE IMPACT 36 18CM CVD LR (INSTRUMENTS) ×2 IMPLANT
NS IRRIG 1000ML POUR BTL (IV SOLUTION) ×3 IMPLANT
PACK GENERAL/GYN (CUSTOM PROCEDURE TRAY) ×3 IMPLANT
RELOAD PROXIMATE 75MM BLUE (ENDOMECHANICALS) ×3 IMPLANT
RELOAD STAPLE 75 3.8 BLU REG (ENDOMECHANICALS) IMPLANT
SPONGE ABDOMINAL VAC ABTHERA (MISCELLANEOUS) ×2 IMPLANT
SPONGE LAP 18X18 RF (DISPOSABLE) IMPLANT
STAPLER PROXIMATE 75MM BLUE (STAPLE) ×2 IMPLANT
STAPLER VISISTAT 35W (STAPLE) ×3 IMPLANT
SUCTION POOLE HANDLE (INSTRUMENTS) ×3
SUT NOV 1 T60/GS (SUTURE) IMPLANT
SUT PROLENE 2 0 BLUE (SUTURE) ×2 IMPLANT
SUT SILK 2 0 (SUTURE)
SUT SILK 2 0 SH CR/8 (SUTURE) ×2 IMPLANT
SUT SILK 2-0 18XBRD TIE 12 (SUTURE) IMPLANT
SUT SILK 3 0 (SUTURE)
SUT SILK 3 0 SH CR/8 (SUTURE) ×2 IMPLANT
SUT SILK 3-0 18XBRD TIE 12 (SUTURE) IMPLANT
SUT VICRYL 2 0 18  UND BR (SUTURE)
SUT VICRYL 2 0 18 UND BR (SUTURE) IMPLANT
TOWEL OR 17X26 10 PK STRL BLUE (TOWEL DISPOSABLE) ×6 IMPLANT
TRAY FOLEY MTR SLVR 16FR STAT (SET/KITS/TRAYS/PACK) IMPLANT
WATER STERILE IRR 1000ML POUR (IV SOLUTION) ×3 IMPLANT
YANKAUER SUCT BULB TIP NO VENT (SUCTIONS) IMPLANT

## 2018-12-10 NOTE — Progress Notes (Addendum)
NAME:  Carmen Stone, MRN:  [id number], DOB:  1956-02-17, LOS: 55 ADMISSION DATE:  11/26/2018, CONSULTATION DATE:  11/21/2018 REFERRING MD:  Dr. Ninfa Linden, Surgery, CHIEF COMPLAINT:  Abdominal pain   Brief History   63 y/o female presented to ER 11/30/2018 with nausea, vomiting, abdominal pain.  CT abd/pelvis showed focal perforated loop of SB with free air with multiple complex ventral hernias.  Underwent emergent laparotomy with SB resection, lysis of adhesions and wound vac.  Remained on vent and pressors post op.  Hospital course complicated by respiratory arrest 6/13, multiple reintubations, intermittent vasopressor needs.    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 Changing volume goal on CRRT to -100 cc an hour. Weaned 7 hours PSV   6/16 Encephalopathic. Hypertensive, adding labetalol.  IV TNA started 6/17 A little more awake.  Tolerating PSV but SBI exceeds 105.  Volume removal with CVVHD 6/18 Neuro status continues to improve.  Hemodynamics stable however did require brief vasoactive drips during nighttime.  She is now at an net negative fluid volume status.  Likely nearing euvolemic state.  PSV 6/19 Extubated 6/20 reintubated for altered mentation, head CT negative  6/21 Tx PRBC x1.  Off vasopressors 6/23: Febrile overnight, Blood Cx. Transfuse 1 U PRBC 6/24: Afebrile. Remains off pressors. 100cc/hr removal /c CRRT. 30% FiO2.  6/25: L IJ, subclavian, axillary  + DVT. Heparin initiated. Remains on CRRT.  6/26: overnight CT ABD/pelvis positive for free air and  ascites. Back to OR this am for ex lap of perforated viscous, resection of previous anastomosis, application of wound vac. 1U RBCs intra op. HD cath clotted off.   Consults:  PCCM 6/7 Renal 6/09 AKI, acidosis Cardiology 6/09 Takotsubo CM   Procedures:  ETT 6/07 >> 6/19, 6/20 >> Rt IJ HD 6/09 >> Rt Painter CVL 6/09 >> Lt radial aline 6/09 >>Discontinued  Significant Diagnostic Tests:  CT abd/pelvis 6/07 >> focal perforated loop of SB with free air with multiple complex ventral hernias Echo 6/08 >> EF 20%, Takotsubo CM CT head 6/20 >> negative for any acute findings CT abdomen 6/20 >> bilateral effusions, atelectasis left lower lobe, results noted.    CT Chest 6/20 >> small bilateral pleural effusions with compressive atelectasis, anterior R 4-7 rib fractures CT ABD/Pelvis 6/20 >> small bowel surgical changes, mild circumferential wall thickening of scattered small bowel loops, small to moderate ascites LUE Duplex 6/24>>DVT L IJ, subclavian, axillary, SVT L cephalic  CT ABD/Pelvis 9/21>>JHERD volume ascites with intraperitoneal free air   Micro Data:  COVID 6/07 >> negative Tracheal aspirate 6/20 >> E Coli Blood Cx 6/23 >>NGTD  Antimicrobials:  Zosyn 6/07 >> 6/17 Diflucan 6/07 >> 6/10 Cefepime 6/21 >>6/26 Vanco 6/21 >>6/25 Zosyn 6/26>> Anidulafungin 6/26>>  Interim history/subjective:  Back to OR for re-ex lap. HD cath clotted off.   Objective   Blood pressure (!) 141/58, pulse (!) 111, temperature (!) 97.5 F (36.4 C), temperature source Oral, resp. rate (!) 27, height 5\' 4"  (1.626 m), weight 72.9 kg, SpO2 100 %.    Vent Mode: PRVC FiO2 (%):  [30 %-  100 %] 30 % Set Rate:  [18 bmp] 18 bmp Vt Set:  [430 mL-460 mL] 430 mL PEEP:  [5 cmH20] 5 cmH20 Plateau Pressure:  [20 cmH20-30 cmH20] 20 cmH20   Intake/Output Summary (Last 24 hours) at 11/22/2018 1008 Last data filed at 11/19/2018 2831 Gross per 24 hour  Intake 2818.55 ml  Output 3101 ml  Net -282.45 ml   Filed Weights    12/07/18 0334 12/09/18 0500 11/20/2018 0500  Weight: 79.9 kg 77.9 kg 72.9 kg    Examination: General: well developed, critically ill appearing Neuro: minimally withdraws to noxious stimuli. Does not blink to threat  CV: RRR. S1 S2. No MRG.  PULM: BBS faint rhonchi upper lobes, FNL, symmetrical. Vent supported. Thin white secretions suctioned.  GI: ABD vertical surgical wound is open with wound vac in place. Firm. Absent bowel sounds.  Extremities: LUE 2+ edema,  1+ edema to RUE. Trace edema to thighs. No edema B/L calves to feet  Skin: PWD. L arm mepilex dressings x 4. Post surfaces not visualized.   Resolved Hospital Problem list   Metabolic alkalosis Abdominal peritonitis with septic shock Circulatory shock, mix of cardiogenic and sepsis off pressors as of 6/15 Cardiorespiratory arrest 6/13 Shock liver Acute metabolic encephalopathy Hypophosphatemia  Assessment & Plan:   Acute Metabolic Encephalopathy -Previous head CT negative, similar unexplained events during this admission  -pt has been on fentanyl infusion overnight, currently off during assessment -Suspect multifactorial in setting of critical illness, infection and protracted course  P: -continue neuro checks -encourage sleep/wake cycle -delirium precautions -limit sedation as able   Fever: -suspect 2/2 DVT and peritonitis and less likely VAP -Has been Tx for E coli in sputum -Blood Cx remain NGTD however pt had anastomosis broken down with holes in same s/p re-ex lap with resection P: -repeat blood Cx -broaden to pip-tazo and add anti-fungal -Porta cath continued for TPN infusing -IR consult for perm HD cath as HD line is day 16  -Continue foley for I/O and  Nephrology request to keep  to monitor for renal recovery. -follow fever curve/WBC  -check lactic acid   Small Bowel Perforation s/p Resection, Lysis of Adhesions -reexploration 6/14, creation of functional end to end small bowel anastomosis and closure  of abdominal wound -reexploration 6/26 with pervious anastomosis broken down with holes in same s/p resection of anastomosis with placement of wound vac P: -continue TPN -remaining plan per surgery   Anemia -H&H continues to be labile s/p 1 U RBCs in OR -suspect anemia of critical illness, acute blood loss and nutritional  P: -continue to trend CBC, transfuse if Hgb < 7 -continue to monitor for SnSx bleeding    Acute Hypoxic Respiratory Failure  -this is patient's 3rd intubation. Unfortunately she has not been in a position to attempt SBT and vent liberation. 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 Vascular team attempting to clear thrombosis.  -IR consult for perm HD cath  -CRRT per neph   Type 2 Diabetes -GLU well controlled 97-116 past 24 hrs P: -continue SSI- Q6H   Thrombocytopenia secondary to sepsis, medications and consumptive  -improved P: -Follow CBC   Moderate Protein Calorie Malnutrition  P: -continue TPN per pharmacy  -TPN labs per protocol   Acute DVT- Left IJ, subclavian, axillary  P: -Heparin infusion currently off due to post op -will be restarted tonight at 2000 per pharmacy d/w surgery    Difficult IV Access P: -Left chest portcath accessed -Unable to d/c  subclavian CVC as no further access options at this time d/t LIJ DVT, existing R IJ and R SCL CVC. Groins high risk for infection and cannot place PICC -consulted IR for perm HD cath placement   Best practice:  Diet: TPN. DVT prophylaxis: SCDs, chemoprophylaxis/treatment on hold until tonight at 2000 GI prophylaxis: Protonix Mobility: Bedrest Code Status: Full code Disposition: continue ICU. [facility]ing [facility] bed at [facility]. Need clear goals of care d/w family as pt is in [facility] and will, at a minimum, require LTACH post hospitalization, possibly including permanent dialysis.   Labs:   CMP Latest Ref Rng & Units 12/04/2018 12/09/2018 12/09/2018  Glucose 70 - 99 mg/dL 116(H) 126(H) 147(H)  BUN 8 - 23 mg/dL 74(H) 68(H) 69(H)  Creatinine 0.44 - 1.00 mg/dL 1.60(H) 1.61(H) 1.49(H)  Sodium 135 - 145 mmol/L 137 136 135  Potassium 3.5 - 5.1 mmol/L 4.8 5.0 5.1  Chloride 98 - 111 mmol/L 100 100 99  CO2 22 - 32 mmol/L 24 25 21(L)  Calcium 8.9 - 10.3 mg/dL 7.9(L) 8.2(L) 8.2(L)  Total Protein 6.5 - 8.1 g/dL - - 7.0  Total Bilirubin 0.3 - 1.2 mg/dL - - 0.9  Alkaline Phos 38 - 126 U/L - - 173(H)  AST 15 - 41 U/L - - 28  ALT 0 - 44 U/L - - 39   CBC Latest Ref Rng & Units 12/12/2018 12/09/2018 12/08/2018  WBC 4.0 - 10.5 K/uL 15.2(H) 16.0(H) 14.8(H)  Hemoglobin 12.0 - 15.0 g/dL 7.5(L) 8.1(L) 7.7(L)  Hematocrit 36.0 - 46.0 % 23.0(L) 24.6(L) 23.7(L)  Platelets 150 - 400 K/uL 122(L) 111(L) 83(L)   ABG-pnding   CBG (last 3)  Recent Labs    12/09/18 1734 12/09/18 2334 12/03/2018 0559  GLUCAP 116* 97 101*

## 2018-12-10 NOTE — Progress Notes (Signed)
Heparin removed from both ports, dressing changed, and CRRT restarted at 2100.

## 2018-12-10 NOTE — Transfer of Care (Signed)
Immediate Anesthesia Transfer of Care Note  Patient: Carmen Stone  Procedure(s) Performed: EXPLORATORY LAPAROTOMY RESECTION OF PREVIOUS ANASTAMOSIS (N/A Abdomen) APPLICATION OF WOUND VAC (N/A Abdomen)  Patient Location: ICU  Anesthesia Type:General  Level of Consciousness: sedated and unresponsive  Airway & Oxygen Therapy: Patient remains intubated per anesthesia plan and Patient placed on Ventilator (see vital sign flow sheet for setting)  Post-op Assessment: Report given to RN  Post vital signs: Reviewed and stable  Last Vitals:  Vitals Value Taken Time  BP    Temp    Pulse    Resp    SpO2      Last Pain:  Vitals:   11/30/2018 0400  TempSrc: Bladder  PainSc:          Complications: No apparent anesthesia complications

## 2018-12-10 NOTE — Progress Notes (Signed)
PHARMACY - ADULT TOTAL PARENTERAL NUTRITION CONSULT NOTE   Pharmacy Consult for TPN Indication: prolonged ileus  HPI: 8 yoF admitted 6/7 with perforated small bowel from closed-loop obstruction and underwent emergent exploratory laparotomy. Returned to OR on 6/9 and again on 6/14 for closure of abdominal wall.  Patient coded on 12/25/22 and developed multiorgan failure. Remains intubated & on CRRT. Pressors have been weaned off and she is completing antibiotic course for sepsis/PNA.  NPO since admission on 6/7. Pharmacy consulted to start TPN 6/16. D/t acute liver injury from shock, initiated TPN at low rate & advance slowly as requested by surgery.  Patient Measurements: Height: 5\' 4"  (162.6 cm) Weight: 160 lb 11.5 oz (72.9 kg) IBW/kg (Calculated) : 54.7 TPN AdjBW (KG): 59.7 Body mass index is 27.59 kg/m. Usual Weight: 90kg   Intake/Output Summary (Last 24 hours) at 12/03/2018 0813 Last data filed at 12/06/2018 0810 Gross per 24 hour  Intake 2623.03 ml  Output 3635 ml  Net -1011.97 ml    Recent Labs    12/09/18 0455 12/09/18 1558 11/26/2018 0352  NA 135 136 137  K 5.1 5.0 4.8  CL 99 100 100  CO2 21* 25 24  GLUCOSE 147* 126* 116*  BUN 69* 68* 74*  CREATININE 1.49* 1.61* 1.60*  CALCIUM 8.2* 8.2* 7.9*  PHOS 3.4 3.5 3.7  MG 2.4  --  2.5*  ALBUMIN 1.7* 1.9* 1.7*  ALKPHOS 173*  --   --   AST 28  --   --   ALT 39  --   --   BILITOT 0.9  --   --     Significant events:  6/18: OK to increase to goal rate per surgery 6/22: CRRT stopped for holiday; CBG 69 overnight, D50 given but TPN continued (55 units insulin in TPN); BS and BM overnight - initiating trickle feeds 6/23: resuming CRRT, return to high-protein TPN; fevers overnight, CCM removing some central lines; will use chemo port for TPN 6/24: began adding lipids to TPN formula 6/25: trickle feeds stopped d/t abd distention 6/26: CRRT clotted off overnight; patient to OR this AM for ex lap; RN attempting to restart CRRT  post-op but still having issues at this time   Central access: 11/20/2018 TPN start date: 11/30/18  ASSESSMENT                                                                                                          Current Nutrition: NPO IVF: On CRRT   Today:   Glucose (goal 100-150) - Hx DM on 70/30 Novolog 20 units BID PTA.  CBGs on lower side with current regimen (range 97-116); suspect lower d/t stopping TFs  45 units insulin in TPN + ICU resistant scale; received 6 units SSI yesterday (none after stopping TFs)  Electrolytes - K now < 5 d/t CRRT, Mg slightly elevated, all other lytes WNL; unclear if CRRT will be resumed soon  Renal - AKI; intrinsic renal function still appears to be poor;  BUN elevated and bicarb now back to WNL  LFTs -  enzymes continue to normalize; TBili WNL; albumin remains low  TGs - WNL 6/22  Prealbumin - remains low but improving 6/22  NUTRITIONAL GOALS                                                                                             RD recs: (6/23 - back on CRRT):  Kcal:1530 kcal, Protein:172-187 grams (2.3-2.5 grams/kg), Fluid:>/= 1.8 L/day  PLAN                                                                                                                          At 1800 today:  Continue TPN at 75 ml/hr - unknown at this time if CRRT will be resumed today, but per discussion with RD, would prefer to keep with high protein formulation given extent of malnutrition and daily follow-up  Custom TPN at goal rate of 75 ml/hr  to provide: 171 g/day protein, 1530 Kcal/day.  Electrolytes: continue to hold Mag & K while off CRRT, others unchanged  TPN to contain standard multivitamins, trace elements MWF only due to national backorder.  IVF per MD  Continue ICU resistant-scale glycemic control orders q6h; reduce insulin to 35 units in TPN while TFs held  TPN lab panels Mondays & Thursdays  [place], PharmD,  BCPS [phone] 11/20/2018, 8:13 AM

## 2018-12-10 NOTE — Progress Notes (Signed)
Upon patient's return from [place] removed heparin from HD cath, flushed, and attempted to restart CRRT at approximately 0930. Machine would not run d/t an access issue. IV team consulted and looked at each line. All lines were verified as being patent with good blood return. Attempted to restart dialysis again but machine would still not run d/t an "access" issue. Several RN's looked over the set up to make sure that there was not a filter issue and to ensure that access was working appropriately. Machine sent to be calibrated by materials management to ensure that it was not a machine issue, replacement machine to be delivered.   CCM has been on the unit and has been made aware of the line/dialysis difficulties today. IR consult was  placed for tunneled HD placement. Dr. Melvia Heaps also updated and he agreed that it is acceptable to wait to resume CRRT tonight when the heparin gtt is resumed and when the new machine has arrived and been calibrated.

## 2018-12-10 NOTE — Progress Notes (Signed)
Patient ID: Carmen Stone, female   DOB: [birth year], 63 y.o.   MRN: [id number]    12 Days Post-Op  Subjective: Patient on vent. CRRT clotted off overnight.  Having a lot of pain overnight.    Objective: Vital signs in last 24 hours: Temp:  [96.6 F (35.9 C)-100.8 F (38.2 C)] 98.2 F (36.8 C) (06/26 0604) Pulse Rate:  [101-126] 111 (06/26 0604) Resp:  [19-36] 23 (06/26 0604) BP: (94-137)/(47-68) 101/56 (06/26 0604) SpO2:  [99 %-100 %] 100 % (06/26 0604) FiO2 (%):  [30 %-100 %] 30 % (06/26 0402) Weight:  [72.9 kg] 72.9 kg (06/26 0500) Last BM Date: 12/09/18  Intake/Output from previous day: 06/25 0701 - 06/26 0700 In: 2337 [I.V.:2117.4; NG/GT:20; IV Piggyback:199.6] Out: 6789 [Urine:5; Emesis/NG output:775] Intake/Output this shift: No intake/output data recorded.  PE: Heart: regular, but tachy Lungs: on vent, breathing over vent some Abd: tight, peritonitic, midline wound packed and not taken down as she is going to the OR  Lab Results:  Recent Labs    12/09/18 0455 12/06/2018 0352  WBC 16.0* 15.2*  HGB 8.1* 7.5*  HCT 24.6* 23.0*  PLT 111* 122*   BMET Recent Labs    12/09/18 1558 12/12/2018 0352  NA 136 137  K 5.0 4.8  CL 100 100  CO2 25 24  GLUCOSE 126* 116*  BUN 68* 74*  CREATININE 1.61* 1.60*  CALCIUM 8.2* 7.9*   PT/INR No results for input(s): LABPROT, INR in the last 72 hours. CMP     Component Value Date/Time   NA 137 12/03/2018 0352   NA 138 06/01/2017 0859   K 4.8 11/24/2018 0352   K 4.2 06/01/2017 0859   CL 100 11/24/2018 0352   CO2 24 12/05/2018 0352   CO2 24 06/01/2017 0859   GLUCOSE 116 (H) 12/09/2018 0352   GLUCOSE 118 06/01/2017 0859   BUN 74 (H) 12/07/2018 0352   BUN 15.2 06/01/2017 0859   CREATININE 1.60 (H) 11/20/2018 0352   CREATININE 0.79 06/02/2018 0856   CREATININE 0.7 06/01/2017 0859   CALCIUM 7.9 (L) 11/19/2018 0352   CALCIUM 9.0 06/01/2017 0859   PROT 7.0 12/09/2018 0455   PROT 7.0 06/01/2017 0859   ALBUMIN 1.7  (L) 11/17/2018 0352   ALBUMIN 3.7 06/01/2017 0859   AST 28 12/09/2018 0455   AST 13 (L) 06/02/2018 0856   AST 14 06/01/2017 0859   ALT 39 12/09/2018 0455   ALT 16 06/02/2018 0856   ALT 12 06/01/2017 0859   ALKPHOS 173 (H) 12/09/2018 0455   ALKPHOS 97 06/01/2017 0859   BILITOT 0.9 12/09/2018 0455   BILITOT 0.3 06/02/2018 0856   BILITOT 0.22 06/01/2017 0859   GFRNONAA 34 (L) 11/26/2018 0352   GFRNONAA >60 06/02/2018 0856   GFRAA 39 (L) 11/18/2018 0352   GFRAA >60 06/02/2018 0856   Lipase     Component Value Date/Time   LIPASE 24 12/12/2018 0355       Studies/Results: Ct Abdomen Pelvis Wo Contrast  Result Date: 12/09/2018 CLINICAL DATA:  History of small bowel perforation. Now with abdominal distention and abdominal pain. EXAM: CT ABDOMEN AND PELVIS WITHOUT CONTRAST TECHNIQUE: Multidetector CT imaging of the abdomen and pelvis was performed following the standard protocol without IV contrast. COMPARISON:  12/04/2018 FINDINGS: Lower chest: Central line tip projects in the upper right atrium. Airspace consolidation noted posterior right lower lobe with collapse/consolidation in the posterior left lower lobe. No substantial pleural effusion. Hepatobiliary: No focal abnormality in the liver on this  study without intravenous contrast. Gallbladder is surgically absent. No intrahepatic or extrahepatic biliary dilation. Pancreas: No focal mass lesion. No dilatation of the main duct. No intraparenchymal cyst. No peripancreatic edema. Spleen: No splenomegaly. No focal mass lesion. Adrenals/Urinary Tract: No adrenal nodule or mass. Kidneys unremarkable on this noncontrast study. No hydroureteronephrosis. Foley catheter decompresses the urinary bladder. Stomach/Bowel: NG tube decompresses the stomach. Duodenum is normally positioned as is the ligament of Treitz. Circumferential wall thickening is noted in proximal jejunal loops in the left upper quadrant (image 40/series 2). Small bowel anastomosis  noted anterior left paramidline abdomen. No small bowel dilatation. Small diverticulum noted on the terminal ileum. 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 Vascular/Lymphatic: No abdominal aortic aneurysm. No abdominal lymphadenopathy Reproductive: The uterus is surgically absent. There is no adnexal mass. Other: Interval development of large volume free fluid in the abdomen and pelvis. This is associated with intraperitoneal free air anteriorly under the rectus sheath and bubbles of gas scattered in the central mesentery. Source for the fluid and gas is not readily evident, but anastomotic etiology would be a likely consideration. Musculoskeletal: Some of the free intraperitoneal air has dissected through the anterior midline fascia into the subcutaneous fat of the anterior abdominal wall. Open midline surgical wound noted. Degenerative changes noted in the right hip. No worrisome lytic or sclerotic osseous abnormality. IMPRESSION: 1. Since the study from 5 days ago, the amount of intraperitoneal free fluid has progressed substantially with large volume ascites now evident. This is associated with prominent intraperitoneal free air collecting under the anterior abdominal wall with gas scattered in the central small bowel mesentery. Etiology for the fluid and gas not definitely identified. Mild circumferential wall thickening is noted in jejunal loops, proximal to the anastomosis. 2. Patchy airspace disease in the right lower lobe is similar. The left lower lobe collapse seen on the previous study has improved significantly. Critical Value/emergent results were called by telephone at the time of interpretation on 12/09/2018 at 6:03 pm to Dr. [name], who verbally acknowledged these results. Electronically Signed   By: Misty Stanley M.D.   On: 12/09/2018 18:04   Dg Abd Portable 1v  Result Date: 12/08/2018 CLINICAL DATA:  Increased abdominal distention.  Ileus. EXAM:  PORTABLE ABDOMEN - 1 VIEW COMPARISON:  CT scan and x-ray dated 12/04/2018 FINDINGS: NG tube tip is in the body of the stomach. No dilated loops of large or small bowel. The contrast from the prior CT scan is now in the ascending and transverse portions of the colon. No acute bone abnormality. IMPRESSION: No dilated bowel. NG tube in good position. Overall density of the abdomen is consistent with the ascites seen on the prior CT scan. Electronically Signed   By: Lorriane Shire M.D.   On: 12/08/2018 12:24   Vas [place] Lower Extremity Venous (dvt)  Result Date: 12/09/2018  Lower Venous Study Indications: Swelling, and fever.  Limitations: Body habitus, poor ultrasound/tissue interface and foley catheter, in ICU, CRRT. Comparison Study: No prior study. Performing Technologist: Maudry Mayhew MHA, RDMS, RVT, RDCS  Examination Guidelines: A complete evaluation includes B-mode imaging, spectral Doppler, color Doppler, and power Doppler as needed of all accessible portions of each vessel. Bilateral testing is considered an integral part of a complete examination. Limited examinations for reoccurring indications may be performed as noted.  +---------+---------------+---------+-----------+----------+-------------------+  RIGHT     Compressibility Phasicity Spontaneity Properties Summary              +---------+---------------+---------+-----------+----------+-------------------+  CFV       Full                                                                  +---------+---------------+---------+-----------+----------+-------------------+  SFJ       Full            No        Yes                    pulsatile flow       +---------+---------------+---------+-----------+----------+-------------------+  FV Prox   Full                                                                  +---------+---------------+---------+-----------+----------+-------------------+  FV Mid    Full                                                                   +---------+---------------+---------+-----------+----------+-------------------+  FV Distal Full                                                                  +---------+---------------+---------+-----------+----------+-------------------+  PFV       Full                                                                  +---------+---------------+---------+-----------+----------+-------------------+  POP       Full            No        Yes                    pulsatile flow       +---------+---------------+---------+-----------+----------+-------------------+  PTV                                                        Not adequately  visualized           +---------+---------------+---------+-----------+----------+-------------------+  PERO                                                       Not adequately                                                                   visualized           +---------+---------------+---------+-----------+----------+-------------------+   +---------+---------------+---------+-----------+----------+--------------+  LEFT      Compressibility Phasicity Spontaneity Properties Summary         +---------+---------------+---------+-----------+----------+--------------+  CFV                                                        Not visualized  +---------+---------------+---------+-----------+----------+--------------+  SFJ                                                        Not visualized  +---------+---------------+---------+-----------+----------+--------------+  FV Prox                                                    Not visualized  +---------+---------------+---------+-----------+----------+--------------+  FV Mid    Full                                                             +---------+---------------+---------+-----------+----------+--------------+  FV Distal Full                                                              +---------+---------------+---------+-----------+----------+--------------+  PFV                                                        Not visualized  +---------+---------------+---------+-----------+----------+--------------+  POP  Not visualized  +---------+---------------+---------+-----------+----------+--------------+  PTV                                                        Not visualized  +---------+---------------+---------+-----------+----------+--------------+  PERO                                                       Not visualized  +---------+---------------+---------+-----------+----------+--------------+     Summary: Right: There is no evidence of deep vein thrombosis in the lower extremity. However, portions of this examination were limited- see technologist comments above. No cystic structure found in the popliteal fossa. Left: There is no evidence of deep vein thrombosis in the lower extremity. However, portions of this examination were limited- see technologist comments above. No cystic structure found in the popliteal fossa.  *See table(s) above for measurements and observations. Electronically signed by Servando Snare MD on 12/09/2018 at 1:24:45 PM.    Final    Vas [place] Upper Extremity Venous Duplex  Result Date: 12/09/2018 UPPER VENOUS STUDY  Indications: Swelling, and fever Limitations: Body habitus, poor ultrasound/tissue interface, in ICU, on ventilator, CRRT, bandaging, and musculoskeletal features. Comparison Study: No prior study Performing Technologist: Maudry Mayhew MHA, RDMS, RVT, RDCS  Examination Guidelines: A complete evaluation includes B-mode imaging, spectral Doppler, color Doppler, and power Doppler as needed of all accessible portions of each vessel. Bilateral testing is considered an integral part of a complete examination. Limited examinations for reoccurring indications may be  performed as noted.  Right Findings: +----------+------------+---------+-----------+----------+---------------------+  RIGHT      Compressible Phasicity Spontaneous Properties        Summary         +----------+------------+---------+-----------+----------+---------------------+  Subclavian                                                Unable to visualize                                                               due to bandaging     +----------+------------+---------+-----------+----------+---------------------+  Left Findings: +----------+------------+---------+-----------+----------+-------+  LEFT       Compressible Phasicity Spontaneous Properties Summary  +----------+------------+---------+-----------+----------+-------+  IJV            None                   No                  Acute   +----------+------------+---------+-----------+----------+-------+  Subclavian     None                   No                  Acute   +----------+------------+---------+-----------+----------+-------+  Axillary       None  No                  Acute   +----------+------------+---------+-----------+----------+-------+  Brachial       Full        Yes        Yes                         +----------+------------+---------+-----------+----------+-------+  Radial         Full                                               +----------+------------+---------+-----------+----------+-------+  Ulnar          Full                                               +----------+------------+---------+-----------+----------+-------+  Cephalic       None                                       Acute   +----------+------------+---------+-----------+----------+-------+  Basilic        Full                                               +----------+------------+---------+-----------+----------+-------+  Summary:  Left: Findings consistent with acute deep vein thrombosis involving the left internal jugular vein, left subclavian vein and  left axillary vein. Findings consistent with acute superficial vein thrombosis involving the left cephalic vein.  *See table(s) above for measurements and observations.  Diagnosing physician: Servando Snare MD Electronically signed by Servando Snare MD on 12/09/2018 at 1:21:38 PM.    Final     Anti-infectives: Anti-infectives (From admission, onward)   Start     Dose/Rate Route Frequency Ordered Stop   12/08/18 1800  vancomycin (VANCOCIN) IVPB 750 mg/150 ml premix  Status:  Discontinued     750 mg 150 mL/hr over 60 Minutes Intravenous Every 48 hours 12/07/18 0822 12/07/18 0903   12/08/18 1000  ceFEPIme (MAXIPIME) 2 g in sodium chloride 0.9 % 100 mL IVPB  Status:  Discontinued     2 g 200 mL/hr over 30 Minutes Intravenous Every 24 hours 12/07/18 0822 12/07/18 0903   12/07/18 2200  ceFEPIme (MAXIPIME) 2 g in sodium chloride 0.9 % 100 mL IVPB     2 g 200 mL/hr over 30 Minutes Intravenous Every 12 hours 12/07/18 0903     12/07/18 1800  vancomycin (VANCOCIN) IVPB 750 mg/150 ml premix  Status:  Discontinued     750 mg 150 mL/hr over 60 Minutes Intravenous Every 24 hours 12/07/18 0903 12/09/18 1040   12/06/18 1800  vancomycin (VANCOCIN) IVPB 750 mg/150 ml premix  Status:  Discontinued     750 mg 150 mL/hr over 60 Minutes Intravenous Every 24 hours 12/05/18 1652 12/07/18 0822   12/05/18 1800  ceFEPIme (MAXIPIME) 2 g in sodium chloride 0.9 % 100 mL IVPB  Status:  Discontinued     2 g 200 mL/hr over 30 Minutes Intravenous Every 12  hours 12/05/18 1644 12/07/18 0822   12/05/18 1700  vancomycin (VANCOCIN) 1,250 mg in sodium chloride 0.9 % 250 mL IVPB     1,250 mg 166.7 mL/hr over 90 Minutes Intravenous  Once 12/05/18 1652 12/05/18 2019   11/30/2018 1800  fluconazole (DIFLUCAN) IVPB 200 mg  Status:  Discontinued     200 mg 100 mL/hr over 60 Minutes Intravenous Every 24 hours 12/14/2018 0748 12/02/2018 0847   11/17/2018 1800  fluconazole (DIFLUCAN) IVPB 400 mg  Status:  Discontinued     400 mg 100 mL/hr over 120  Minutes Intravenous Every 24 hours 11/15/2018 0847 12/08/2018 0856   12/02/2018 1400  piperacillin-tazobactam (ZOSYN) IVPB 2.25 g  Status:  Discontinued     2.25 g 100 mL/hr over 30 Minutes Intravenous Every 6 hours 11/15/2018 0748 12/01/18 1104   12/05/2018 0600  cefoTEtan (CEFOTAN) 2 g in sodium chloride 0.9 % 100 mL IVPB     2 g 200 mL/hr over 30 Minutes Intravenous On call to O.R. 11/22/18 1300 11/26/2018 0624   11/22/18 1800  fluconazole (DIFLUCAN) IVPB 400 mg  Status:  Discontinued     400 mg 100 mL/hr over 120 Minutes Intravenous Every 24 hours 11/20/2018 1528 11/26/2018 0748   11/22/18 1800  vancomycin (VANCOCIN) 1,500 mg in sodium chloride 0.9 % 500 mL IVPB  Status:  Discontinued     1,500 mg 250 mL/hr over 120 Minutes Intravenous Every 24 hours 11/22/18 0358 11/22/18 0750   11/22/18 0115  vancomycin (VANCOCIN) 1,500 mg in sodium chloride 0.9 % 500 mL IVPB     1,500 mg 250 mL/hr over 120 Minutes Intravenous  Once 11/22/18 0101 11/22/18 0319   11/29/2018 1630  fluconazole (DIFLUCAN) IVPB 800 mg     800 mg 100 mL/hr over 240 Minutes Intravenous  Once 11/16/2018 1527 11/24/2018 2013   12/09/2018 1600  piperacillin-tazobactam (ZOSYN) IVPB 3.375 g  Status:  Discontinued     3.375 g 12.5 mL/hr over 240 Minutes Intravenous Every 8 hours 11/26/2018 1522 11/22/2018 0748   12/02/2018 0545  piperacillin-tazobactam (ZOSYN) IVPB 3.375 g     3.375 g 100 mL/hr over 30 Minutes Intravenous  Once 12/02/2018 0530 12/11/2018 0626   12/11/2018 0530  piperacillin-tazobactam (ZOSYN) IVPB 4.5 g  Status:  Discontinued     4.5 g 200 mL/hr over 30 Minutes Intravenous  Once 12/01/2018 0517 11/15/2018 0529       Assessment/Plan HTN HLD DM Hypothyroidism 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 Ninfa Linden- POD #19 2. S/pReexploration of [facility] laparotomy[facility] Dr. Rosendo Gros- POD #17 3. S/pExploratory laparotomy with washout of abdomen and placement of vacuum VAC dressing6/11 Dr. Brantley Stage- POD #15 4. Exploratory laparotomy with anastomosis of SB and closure of abdominal wall 6/14 Dr. Brantley Stage- POD #12 -CT scan from yesterday noted with free air and significant ascites.  To OR today. -cont TNA for now -WBC 15K today -Cont BID NSWD dressing changes to midline wound -heparin held overnight for OR today -T&S for possible need for transfusion.  hgb 7.5 today  ID -diflucan 6/7-6/11, vancomycin 6/8-6/8.zosyn 6/7>>6/21, Cefepime 6/21 --> FEN -IVF, NGT,TNA  VTE -SCDs, heparin gtt, on hold for OR Foley -in place Contact -  brother [phone] or [phone]) - pt is full code   LOS: 19 days    [name] , [facility] 12/14/2018, 7:10 AM Pager: [phone]

## 2018-12-10 NOTE — Progress Notes (Signed)
CRRT filter clotted at 0445. Blood was not returned to patient due to clotting.

## 2018-12-10 NOTE — Anesthesia Preprocedure Evaluation (Signed)
Anesthesia Evaluation  Patient identified by MRN, date of birth, ID band Patient unresponsive    Reviewed: Unable to perform ROS - Chart review onlyPreop documentation limited or incomplete due to emergent nature of procedure.  Airway Mallampati: Intubated       Dental   Pulmonary    Pulmonary exam normal        Cardiovascular hypertension, Pt. on medications Normal cardiovascular exam     Neuro/Psych    GI/Hepatic   Endo/Other  diabetes, Type 2, Oral Hypoglycemic Agents  Renal/GU      Musculoskeletal   Abdominal   Peds  Hematology   Anesthesia Other Findings   Reproductive/Obstetrics                             Anesthesia Physical Anesthesia Plan  ASA: III and emergent  Anesthesia Plan: General   Post-op Pain Management:    Induction: Intravenous  PONV Risk Score and Plan: 3 and Treatment may vary due to age or medical condition  Airway Management Planned: Oral ETT  Additional Equipment:   Intra-op Plan:   Post-operative Plan: Post-operative intubation/ventilation  Informed Consent: I have reviewed the patients History and Physical, chart, labs and discussed the procedure including the risks, benefits and alternatives for the proposed anesthesia with the patient or authorized representative who has indicated his/her understanding and acceptance.       Plan Discussed with: CRNA and Surgeon  Anesthesia Plan Comments:         Anesthesia Quick Evaluation

## 2018-12-10 NOTE — Progress Notes (Deleted)
  After instillation of TPA at 11:30, tubing and vacuutainer connected, successfully drained 1.6 L of clear blood tinged fluid.  Ok to use PleurX at home as previously instructed.  Judie Grieve Lala Been PA-C 12/09/2018 2:34 PM

## 2018-12-10 NOTE — Progress Notes (Signed)
Pharmacy Antibiotic Note  Carmen Stone is a 63 y.o. female admitted on 11/20/2018 with perforated loop of SB. Her hospital stay has been complicated by cardiac arrest requiring intubation & pressors as well as CRRT for AKI.  She completed 10d course of Zosyn on 6/17.   She had low grade fever of 100.2F & bradycardia requiring pressors to be resumed today.  WBC has also been trending up off Zosyn.  Chest CT 6/20 could not rule out pneumonia.  Pharmacy was consulted for Vancomycin & Cefepime dosing.  6/23: new fevers, BCx ordered, removed 1 of 2 central lines, changed Foley, using Port for TPN   Today, 12/09/2018:  Fevers gagin this AM and taken back to OR this AM revealing perforated bowel, cefepime changed to Zosyn + Eraxis  CRRT clotted 6/26 AM, still trying to resume at the time of this writing; may get permanent HD cath soon  WBC elevated but trending slowly down   Plan:  Zosyn 2.25 g IV q6 hrs,   Pharmacy will f/u and convert Zosyn to CRRT dosing (recommend 3.375 g q6 hr) once reliably back on CRRT  Eraxis per MD, dosing appropriate  Height: 5\' 4"  (162.6 cm) Weight: 160 lb 11.5 oz (72.9 kg) IBW/kg (Calculated) : 54.7  Temp (24hrs), Avg:98.1 F (36.7 C), Min:96.6 F (35.9 C), Max:100.8 F (38.2 C)  Recent Labs  Lab 12/06/18 0228  12/07/18 1021  12/08/18 0509 12/08/18 1910 12/09/18 0455 12/09/18 1558 12/09/18 1856 11/18/2018 0352 11/15/2018 1222  WBC 24.0*  --  17.3*  --  14.8*  --  16.0*  --   --  15.2*  --   CREATININE 1.03*  1.09*   < >  --    < > 1.84* 1.61* 1.49* 1.61*  --  1.60*  --   LATICACIDVEN  --   --   --   --   --   --   --   --  2.0*  --  1.6   < > = values in this interval not displayed.    Estimated Creatinine Clearance: 34.8 mL/min (A) (by C-G formula based on SCr of 1.6 mg/dL (H)).    No Known Allergies  Antimicrobials this admission: 6/7 zosyn > 6/17 6/7 fluconazole > 6/11 6/8 vancomycin >> 6/8, resumed 6/21>> 6/24 6/21 cefepime>>  6/26 6/26 Zosyn >> 6/26 Eraxis >>  Dose adjustments this admission:  Microbiology results: 6/7 Covid 19 negative 6/8 MRSA PCR: negative 6/21 Tracheal aspirate: rare E.Coli; R to ampicillin and Unasyn 6/23 BCx (x1): ngtd 6/26 BCx: sent  Thank you for allowing pharmacy to be a part of this patient's care.  Reuel Boom, PharmD, BCPS [phone] 11/18/2018, 2:17 PM

## 2018-12-10 NOTE — Progress Notes (Signed)
eLink Physician-Brief Progress Note Patient Name: Carmen Stone DOB: [birth year] MRN: [id number]   Date of Service  11/22/2018  HPI/Events of Note  CRRT discontinued this AM , RN requesting Heparin lock order for dialysis catheter  eICU Interventions  Heparin heplock flush order entered        Frederik Pear 12/01/2018, 5:36 AM

## 2018-12-10 NOTE — Progress Notes (Signed)
[place] for IV heparin Indication: IJ DVT  No Known Allergies  Patient Measurements: Height: '5\' 4"'  (162.6 cm) Weight: 160 lb 11.5 oz (72.9 kg) IBW/kg (Calculated) : 54.7 Heparin Dosing Weight: 71 kg  Vital Signs: Temp: 97.5 F (36.4 C) (06/26 0800) Temp Source: Oral (06/26 0800) BP: 141/58 (06/26 0700) Pulse Rate: 111 (06/26 0700)  Labs: Recent Labs    12/08/18 0509  12/09/18 0004 12/09/18 0455 12/09/18 0900 12/09/18 1558 11/18/2018 0352  HGB 7.7*  --   --  8.1*  --   --  7.5*  HCT 23.7*  --   --  24.6*  --   --  23.0*  PLT 83*  --   --  111*  --   --  122*  APTT 39*  --   --  85*  --   --   --   HEPARINUNFRC  --   --  0.22*  --  0.35  --   --   CREATININE 1.84*   < >  --  1.49*  --  1.61* 1.60*   < > = values in this interval not displayed.    Estimated Creatinine Clearance: 34.8 mL/min (A) (by C-G formula based on SCr of 1.6 mg/dL (H)).  Scheduled:  . sodium chloride   Intravenous Once  . chlorhexidine gluconate (MEDLINE KIT)  15 mL Mouth Rinse BID  . Chlorhexidine Gluconate Cloth  6 each Topical Daily  . dorzolamide-timolol  1 drop Both Eyes BID  . heparin lock flush  500 Units Intravenous Once  . insulin aspart  3-9 Units Subcutaneous Q6H  . mouth rinse  15 mL Mouth Rinse 10 times per day  . pantoprazole (PROTONIX) IV  40 mg Intravenous Q24H   Infusions:  .  prismasol BGK 4/2.5 Stopped (12/14/2018 0445)  .  prismasol BGK 4/2.5 Stopped (11/26/2018 0445)  . sodium chloride 10 mL/hr at 11/15/2018 0700  . ceFEPime (MAXIPIME) IV Stopped (12/09/18 2316)  . fentaNYL infusion INTRAVENOUS 50 mcg/hr (12/02/2018 0729)  . heparin    . prismasol BGK 4/2.5 Stopped (11/19/2018 0445)  . TPN ADULT (ION) 75 mL/hr at 12/01/2018 0700   PRN: sodium chloride, albuterol, alteplase, fentaNYL (SUBLIMAZE) injection, heparin, hydrALAZINE, metoprolol tartrate, sodium chloride, sodium chloride flush  Assessment: 34 yoF known to pharmacy from TPN and  antibiotic dosing; complicated hospital course including cardiac arrest, intubation, VAP, and CRRT, now further complicated with extensive IJ clot (only L sided catheter removed 6/9). Pharmacy to dose heparin infusion.   Baseline INR, aPTT: baseline INR/aPTT mildly elevated but did have recent acute liver injury  Prior anticoagulation: none  Significant events: 6/25: heparin held in PM for OR tomorrow 6/26: ex lap for perforated viscus repair, PRBC given during procedure  Today, 12/03/2018:  CBC: anemia of acute/chronic illness (and likely also d/t CRRT) requiring multiple transfusions; slightly lower than yesterday and given PRBC in OR; Plt low but improving  Most recent heparin level therapeutic but low end of range on 1400 units/hr; rate increased to 1450 units/hr but no level rechecked d/t turning off heparin for OR 6/26  No bleeding or infusion issues per nursing, no heparin or other anticoagulation running in CRRT circuit  Per surgery, resume heparin tonight at 8p w/o bolus  Goal of Therapy: Heparin level 0.3-0.7 units/ml Monitor platelets by anticoagulation protocol: Yes  Plan:  Resume heparin at 1450 units/hr tonight without bolus  Check 8-hr heparin level  Daily CBC, daily heparin level once stable  Monitor for signs of bleeding or thrombosis   Reuel Boom, PharmD, BCPS [phone] 12/08/2018, 9:20 AM

## 2018-12-10 NOTE — Progress Notes (Signed)
Patient returned from procedure; placed back on previous settings. RT will continue to monitor patient.

## 2018-12-10 NOTE — Op Note (Signed)
12/04/2018  8:31 AM  PATIENT:  Carmen Stone  63 y.o. female  PRE-OPERATIVE DIAGNOSIS:  PERFORATED VISCOUS,SEPSIS  POST-OPERATIVE DIAGNOSIS:  PERFORATED VISCOUS,SEPSIS  PROCEDURE:  Procedure(s): EXPLORATORY LAPAROTOMY RESECTION OF PREVIOUS ANASTAMOSIS (N/A) APPLICATION OF WOUND VAC (N/A)  SURGEON:  Surgeon(s) and Role:    * Jovita Kussmaul, MD - Primary    * [name], MD - Assisting  PHYSICIAN ASSISTANT:   ASSISTANTS: Dr. Harlow Asa   ANESTHESIA:   general  EBL:  minimal   BLOOD ADMINISTERED:1 unit CC PRBC  DRAINS: none   LOCAL MEDICATIONS USED:  NONE  SPECIMEN:  Source of Specimen:  small bowel anastamosis  DISPOSITION OF SPECIMEN:  PATHOLOGY  COUNTS:  YES  TOURNIQUET:  * No tourniquets in log *  DICTATION: .Dragon Dictation   After informed consent was obtained the patient was brought to the operating room and placed in the supine position on the operating table.  After adequate induction of general anesthesia the patient's abdomen was prepped with Betadine and draped in usual sterile manner.  An appropriate timeout was performed.  The stitches were removed from the midline incision and the abdominal cavity was reopened.  A large amount of succus material was evacuated.  The abdomen was inspected.  The anastomosis was right under the wound opening in the anterior abdomen and the anastomosis had obviously broken down and had multiple holes in it.  A GIA-75 stapler was placed above and below the anastomosis, clamped, and fired thereby dividing the small bowel between staple lines.  The mesentery to the anastomosis was taken down sharply with the LigaSure.  The anastomosis was sent to pathology for further evaluation.  The abdomen was then irrigated with copious amounts of saline.  Because of the amount of contamination and the unhealthy nature of the small intestine we decided to leave her stapled off for right now and packed the abdominal cavity with a vacuum dressing.   We will take her back to the ICU intubated for further resuscitation measures.  At the end of the case all needle sponge and instrument counts were correct.  The patient tolerated the procedure well.  PLAN OF CARE: Admit to inpatient   PATIENT DISPOSITION:  ICU - intubated and critically ill.   Delay start of Pharmacological VTE agent (>24hrs) due to surgical blood loss or risk of bleeding: yes

## 2018-12-10 NOTE — Progress Notes (Signed)
   [facility] Progress Note   Subjective:       Objective:   BP (!) 141/58   Pulse (!) 111   Temp (!) 97.5 F (36.4 C) (Oral)   Resp (!) 27   Ht '5\' 4"'$  (1.626 m)   Wt 72.9 kg   SpO2 100%   BMI 27.59 kg/m   Intake/Output Summary (Last 24 hours) at 12/07/2018 0959 Last data filed at 12/02/2018 6213 Gross per 24 hour  Intake 3022.86 ml  Output 3439 ml  Net -416.14 ml   Weight change: -5 kg  Physical Exam: Genintubated NECK: no JVD  PULM: clear bilat CV: RRR no MRG ABD: soft, dressings c/d/i, quiet bowel sounds GUfoley cath minimal urine,  Ext: 1-2+ diffuse edema Neuro: intubated   Assessment/ Plan:    1. Acute renal failure: last known baseline 0.79 05/2018.  She has acute oliguric AKI in the setting of septic shock due to perf bowel likely 2/2/ ATN. SP CRRT 6/9 - 6/12, held after PEA arrest.  Resumed 6/14 after abdominal closure.  - cont CRRT - vol and wt's down significantly, keep even for now  2. Septic shock: appears to have resolved, remains off pressors. s/p IV abx  3. Acute hypoxic respiratory failure: reintubated 6/20, CT head negative  4. Perforated small bowel due to closed-loop SBO / septic shock- per CCM/ gen surg, s/p closure 6/14. Now has had another leak due to non-healing of anastomosis, back to OR 6/26 today did resection of prior anastamosis, was not reconnected.   5. DM: per primary  6. Takatsubo's cardiomyopathy: EF ~ 20%  7. Severe hypoalbuminemia: per primary-- on TPN  8. SP PEA arrest: on 6/13  Kelly Splinter, MD 12/06/2018, 3:16 PM    Labs: BMET Recent Labs  Lab 12/07/18 0330 12/07/18 1846 12/08/18 0509 12/08/18 1910 12/09/18 0455 12/09/18 1558 11/24/2018 0352  NA 132* 134* 134* 135 135 136 137  K 4.6 4.8 4.8 5.1 5.1 5.0 4.8  CL 98 99 99 99 99 100 100  CO2 21* 21* 23 23 21* 25 24  GLUCOSE 130* 128* 124* 149* 147* 126* 116*  BUN 90* 87* 79* 67* 69* 68* 74*  CREATININE 2.40* 2.46* 1.84* 1.61* 1.49* 1.61* 1.60*   CALCIUM 7.7* 7.6* 7.7* 7.9* 8.2* 8.2* 7.9*  PHOS 5.9* 6.2* 4.3 3.6 3.4 3.5 3.7   CBC Recent Labs  Lab 12/06/18 0228 12/07/18 1021 12/07/18 1736 12/08/18 0509 12/09/18 0455 12/11/2018 0352  WBC 24.0* 17.3*  --  14.8* 16.0* 15.2*  NEUTROABS 21.2*  --   --   --   --   --   HGB 7.5* 6.6* 8.4* 7.7* 8.1* 7.5*  HCT 23.0* 19.7* 25.0* 23.7* 24.6* 23.0*  MCV 90.9 91.6  --  92.6 93.2 94.7  PLT 85* 109*  --  83* 111* 122*    Medications:    . chlorhexidine gluconate (MEDLINE KIT)  15 mL Mouth Rinse BID  . Chlorhexidine Gluconate Cloth  6 each Topical Daily  . dorzolamide-timolol  1 drop Both Eyes BID  . heparin lock flush  500 Units Intravenous Once  . insulin aspart  3-9 Units Subcutaneous Q6H  . mouth rinse  15 mL Mouth Rinse 10 times per day  . pantoprazole (PROTONIX) IV  40 mg Intravenous Q24H

## 2018-12-10 NOTE — Anesthesia Postprocedure Evaluation (Signed)
Anesthesia Post Note  Patient: Carmen Stone  Procedure(s) Performed: EXPLORATORY LAPAROTOMY RESECTION OF PREVIOUS ANASTAMOSIS (N/A Abdomen) APPLICATION OF WOUND VAC (N/A Abdomen)     Patient location during evaluation: PACU Anesthesia Type: General Level of consciousness: awake and alert Pain management: pain level controlled Vital Signs Assessment: post-procedure vital signs reviewed and stable Respiratory status: spontaneous breathing, nonlabored ventilation, respiratory function stable and patient connected to nasal cannula oxygen Cardiovascular status: blood pressure returned to baseline and stable Postop Assessment: no apparent nausea or vomiting Anesthetic complications: no    Last Vitals:  Vitals:   12/12/2018 0604 11/22/2018 0700  BP: (!) 101/56 (!) 141/58  Pulse: (!) 111 (!) 111  Resp: (!) 23 (!) 27  Temp: 36.8 C 37.2 C  SpO2: 100% 100%    Last Pain:  Vitals:   12/01/2018 0400  TempSrc: Bladder  PainSc:                  Iwalani Templeton DAVID

## 2018-12-11 ENCOUNTER — Inpatient Hospital Stay (HOSPITAL_COMMUNITY): Payer: BLUE CROSS/BLUE SHIELD

## 2018-12-11 LAB — GLUCOSE, CAPILLARY
Glucose-Capillary: 112 mg/dL — ABNORMAL HIGH (ref 70–99)
Glucose-Capillary: 113 mg/dL — ABNORMAL HIGH (ref 70–99)
Glucose-Capillary: 113 mg/dL — ABNORMAL HIGH (ref 70–99)
Glucose-Capillary: 113 mg/dL — ABNORMAL HIGH (ref 70–99)

## 2018-12-11 LAB — RENAL FUNCTION PANEL
Albumin: 1.3 g/dL — ABNORMAL LOW (ref 3.5–5.0)
Albumin: 1.3 g/dL — ABNORMAL LOW (ref 3.5–5.0)
Anion gap: 11 (ref 5–15)
Anion gap: 8 (ref 5–15)
BUN: 86 mg/dL — ABNORMAL HIGH (ref 8–23)
BUN: 65 mg/dL — ABNORMAL HIGH (ref 8–23)
CO2: 23 mmol/L (ref 22–32)
CO2: 25 mmol/L (ref 22–32)
Calcium: 7.4 mg/dL — ABNORMAL LOW (ref 8.9–10.3)
Calcium: 7.3 mg/dL — ABNORMAL LOW (ref 8.9–10.3)
Chloride: 102 mmol/L (ref 98–111)
Chloride: 103 mmol/L (ref 98–111)
Creatinine, Ser: 1.94 mg/dL — ABNORMAL HIGH (ref 0.44–1.00)
Creatinine, Ser: 1.48 mg/dL — ABNORMAL HIGH (ref 0.44–1.00)
GFR calc Af Amer: 31 mL/min — ABNORMAL LOW (ref >60)
GFR calc Af Amer: 43 mL/min — ABNORMAL LOW (ref >60)
GFR calc non Af Amer: 27 mL/min — ABNORMAL LOW (ref >60)
GFR calc non Af Amer: 37 mL/min — ABNORMAL LOW (ref >60)
Glucose, Bld: 124 mg/dL — ABNORMAL HIGH (ref 70–99)
Glucose, Bld: 142 mg/dL — ABNORMAL HIGH (ref 70–99)
Phosphorus: 3.6 mg/dL (ref 2.5–4.6)
Phosphorus: 2.5 mg/dL (ref 2.5–4.6)
Potassium: 4.5 mmol/L (ref 3.5–5.1)
Potassium: 4.1 mmol/L (ref 3.5–5.1)
Sodium: 136 mmol/L (ref 135–145)
Sodium: 136 mmol/L (ref 135–145)

## 2018-12-11 LAB — APTT: aPTT: 99 seconds — ABNORMAL HIGH (ref 24–36)

## 2018-12-11 LAB — MAGNESIUM: Magnesium: 2.2 mg/dL (ref 1.7–2.4)

## 2018-12-11 LAB — CBC
HCT: 21.3 % — ABNORMAL LOW (ref 36.0–46.0)
Hemoglobin: 7.1 g/dL — ABNORMAL LOW (ref 12.0–15.0)
MCH: 31.1 pg (ref 26.0–34.0)
MCHC: 33.3 g/dL (ref 30.0–36.0)
MCV: 93.4 fL (ref 80.0–100.0)
Platelets: 121 10*3/uL — ABNORMAL LOW (ref 150–400)
RBC: 2.28 MIL/uL — ABNORMAL LOW (ref 3.87–5.11)
RDW: 17.2 % — ABNORMAL HIGH (ref 11.5–15.5)
WBC: 17.6 10*3/uL — ABNORMAL HIGH (ref 4.0–10.5)
nRBC: 0 % (ref 0.0–0.2)

## 2018-12-11 LAB — HEPARIN LEVEL (UNFRACTIONATED)
Heparin Unfractionated: 0.27 IU/mL — ABNORMAL LOW (ref 0.30–0.70)
Heparin Unfractionated: 0.35 IU/mL (ref 0.30–0.70)
Heparin Unfractionated: 0.4 IU/mL (ref 0.30–0.70)

## 2018-12-11 MED ORDER — HEPARIN (PORCINE) 25000 UT/250ML-% IV SOLN
1600.0000 [IU]/h | INTRAVENOUS | Status: DC
  Administered 2018-12-11 (×2): 1600 [IU]/h via INTRAVENOUS
  Filled 2018-12-11: qty 250

## 2018-12-11 MED ORDER — STERILE WATER FOR INJECTION IV SOLN
INTRAVENOUS | Status: AC
  Administered 2018-12-11: 18:00:00 via INTRAVENOUS
  Filled 2018-12-11: qty 1140

## 2018-12-11 MED ORDER — LEVOTHYROXINE SODIUM 100 MCG/5ML IV SOLN
12.5000 ug | Freq: Every day | INTRAVENOUS | Status: DC
  Administered 2018-12-11 – 2018-12-28 (×17): 12.5 ug via INTRAVENOUS
  Filled 2018-12-11 (×18): qty 5

## 2018-12-11 MED ORDER — HEPARIN (PORCINE) 25000 UT/250ML-% IV SOLN
1650.0000 [IU]/h | INTRAVENOUS | Status: DC
  Administered 2018-12-11 – 2018-12-12 (×2): 1650 [IU]/h via INTRAVENOUS
  Filled 2018-12-11 (×2): qty 250

## 2018-12-11 MED ORDER — PIPERACILLIN-TAZOBACTAM 3.375 G IVPB
3.3750 g | Freq: Four times a day (QID) | INTRAVENOUS | Status: DC
  Administered 2018-12-11 – 2018-12-24 (×53): 3.375 g via INTRAVENOUS
  Filled 2018-12-11 (×54): qty 50

## 2018-12-11 MED ORDER — SODIUM CHLORIDE 0.9 % IV SOLN
INTRAVENOUS | Status: DC | PRN
  Administered 2018-12-11 – 2018-12-17 (×2): 250 mL via INTRAVENOUS
  Administered 2018-12-19: 1000 mL via INTRAVENOUS

## 2018-12-11 NOTE — Progress Notes (Signed)
  [facility] Progress Note   Subjective:    no new changes overnight   Objective:   BP (!) 165/53   Pulse 89   Temp 97.7 F (36.5 C)   Resp (!) 21   Ht _0  (1.626 m)   Wt 72.2 kg   SpO2 100%   BMI 27.32 kg/m   Intake/Output Summary (Last 24 hours) at 12/11/2018 0751 Last data filed at 12/11/2018 0700 Gross per 24 hour  Intake 3351.34 ml  Output 2827 ml  Net 524.34 ml   Weight change: -0.7 kg  Physical Exam: Genintubated, sedated , grimacing NECK: no JVD  PULM: clear bilat CV: RRR no MRG ABD: soft, dressings c/d/i GUfoley cath minimal urine Ext: minimal edema except 2+ LUE Neuro: intubated   Assessment/ Plan:    1. Acute renal failure: last known baseline 0.79 05/2018.  She has acute oliguric AKI in the setting of septic shock due to perf bowel likely 2/2/ ATN. SP CRRT 6/9 - 6/12, held after PEA arrest.  Resumed 6/14 after abdominal closure.  - +500 cc last 24 hrs, wt's stable - cont CRRT keeping even  2. Perforated small bowel due to closed-loop SBO / septic shock- per CCM/ gen surg, s/p closure 6/14.  New recent bowel leak due to non-healing of anastomosis. Went to OR 6/26 for resection of prior anastamosis, was not reconnected  3. Acute hypoxic respiratory failure: reintubated 6/20, CT head negative  4. DM: per primary  5. Takatsubo's cardiomyopathy: EF ~ 20%  6. Severe hypoalbuminemia: per primary-- on TPN  7. SP PEA arrest: on 6/13  Kelly Splinter, MD 12/06/2018, 3:16 PM    Labs: BMET Recent Labs  Lab 12/08/18 0509 12/08/18 1910 12/09/18 0455 12/09/18 1558 12/02/2018 0352 11/20/2018 1700 12/11/18 0409  NA 134* 135 135 136 137 136 136  K 4.8 5.1 5.1 5.0 4.8 5.1 4.5  CL 99 99 99 100 100 100 102  CO2 23 23 21* 25 24 20* 23  GLUCOSE 124* 149* 147* 126* 116* 102* 124*  BUN 79* 67* 69* 68* 74* 93* 86*  CREATININE 1.84* 1.61* 1.49* 1.61* 1.60* 2.25* 1.94*  CALCIUM 7.7* 7.9* 8.2* 8.2* 7.9* 7.6* 7.4*  PHOS 4.3 3.6 3.4 3.5 3.7 5.7*  3.6   CBC Recent Labs  Lab 12/06/18 0228  12/09/18 0455 11/22/2018 0352 11/18/2018 1415 12/11/18 0409  WBC 24.0*   < > 16.0* 15.2* 12.8* 17.6*  NEUTROABS 21.2*  --   --   --   --   --   HGB 7.5*   < > 8.1* 7.5* 7.5* 7.1*  HCT 23.0*   < > 24.6* 23.0* 23.3* 21.3*  MCV 90.9   < > 93.2 94.7 95.1 93.4  PLT 85*   < > 111* 122* 108* 121*   < > = values in this interval not displayed.    Medications:    . chlorhexidine gluconate (MEDLINE KIT)  15 mL Mouth Rinse BID  . Chlorhexidine Gluconate Cloth  6 each Topical Daily  . dorzolamide-timolol  1 drop Both Eyes BID  . heparin lock flush  500 Units Intravenous Once  . insulin aspart  3-9 Units Subcutaneous Q6H  . mouth rinse  15 mL Mouth Rinse 10 times per day  . pantoprazole (PROTONIX) IV  40 mg Intravenous Q24H

## 2018-12-11 NOTE — Progress Notes (Signed)
ANTICOAGULATION CONSULT NOTE  Pharmacy Consult for IV heparin Indication: IJ DVT  No Known Allergies  Patient Measurements: Height: '5\' 4"'  (162.6 cm) Weight: 159 lb 2.8 oz (72.2 kg) IBW/kg (Calculated) : 54.7 Heparin Dosing Weight: 71 kg  Vital Signs: Temp: 98.8 F (37.1 C) (06/27 2000) Temp Source: Core (Comment) (06/27 2000) BP: 131/59 (06/27 2039) Pulse Rate: 87 (06/27 2039)  Labs: Recent Labs    12/09/18 0455  11/22/2018 0352 11/15/2018 1415 11/16/2018 1700 12/11/18 0409 12/11/18 1256 12/11/18 1600 12/11/18 2133  HGB 8.1*  --  7.5* 7.5*  --  7.1*  --   --   --   HCT 24.6*  --  23.0* 23.3*  --  21.3*  --   --   --   PLT 111*  --  122* 108*  --  121*  --   --   --   APTT 85*  --   --   --   --  99*  --   --   --   HEPARINUNFRC  --    < >  --   --   --  0.27* 0.35  --  0.40  CREATININE 1.49*   < > 1.60*  --  2.25* 1.94*  --  1.48*  --    < > = values in this interval not displayed.    Estimated Creatinine Clearance: 37.4 mL/min (A) (by C-G formula based on SCr of 1.48 mg/dL (H)).  Scheduled:  . chlorhexidine gluconate (MEDLINE KIT)  15 mL Mouth Rinse BID  . Chlorhexidine Gluconate Cloth  6 each Topical Daily  . dorzolamide-timolol  1 drop Both Eyes BID  . heparin lock flush  500 Units Intravenous Once  . insulin aspart  3-9 Units Subcutaneous Q6H  . levothyroxine  12.5 mcg Intravenous Daily  . mouth rinse  15 mL Mouth Rinse 10 times per day  . pantoprazole (PROTONIX) IV  40 mg Intravenous Q24H   Infusions:  .  prismasol BGK 4/2.5 400 mL/hr at 12/11/18 1407  .  prismasol BGK 4/2.5 200 mL/hr at 12/11/18 0450  . sodium chloride 10 mL/hr at 12/11/18 1900  . sodium chloride Stopped (12/11/18 1655)  . anidulafungin Stopped (12/11/18 1645)  . fentaNYL infusion INTRAVENOUS 75 mcg/hr (12/11/18 2027)  . heparin 1,650 Units/hr (12/11/18 1900)  . norepinephrine (LEVOPHED) Adult infusion 3 mcg/min (12/11/18 1938)  . piperacillin-tazobactam (ZOSYN)  IV Stopped (12/11/18 1832)   . prismasol BGK 4/2.5 1,500 mL/hr at 12/11/18 1912  . TPN ADULT (ION) 75 mL/hr at 12/11/18 1900   PRN: sodium chloride, sodium chloride, albuterol, alteplase, fentaNYL (SUBLIMAZE) injection, heparin, hydrALAZINE, metoprolol tartrate, sodium chloride, sodium chloride flush  Assessment: 62 yoF known to pharmacy from TPN and antibiotic dosing; complicated hospital course including cardiac arrest, intubation, VAP, and CRRT, now further complicated with extensive IJ clot (only L sided catheter removed 6/9). Pharmacy to dose heparin infusion.   Baseline INR, aPTT: baseline INR/aPTT mildly elevated but did have recent acute liver injury  Prior anticoagulation: none  Significant events: 6/25: heparin held in PM for OR tomorrow 6/26: ex lap for perforated viscus repair, PRBC given during procedure; heparin resumed 8p  Today, 12/11/2018:  CBC: anemia of acute/chronic illness (and likely also d/t CRRT) requiring multiple transfusions; slightly lower than yesterday and given PRBC in OR; Plt low but improving  Most recent heparin level therapeutic but low end of range on 1400 units/hr; rate increased to 1450 units/hr but no level rechecked d/t turning off heparin  for OR 6/26  No bleeding or infusion issues per nursing, no heparin or other anticoagulation running in CRRT circuit  2133 HL = 0.40 at goal no infusion or bleeding issues per RN.   Goal of Therapy: Heparin level 0.3-0.7 units/ml Monitor platelets by anticoagulation protocol: Yes  Plan:  Continue heparin drip at 1650 units/hr  Daily CBC, daily heparin level once stable  Monitor for signs of bleeding or thrombosis    Dorrene German 12/11/2018, 10:35 PM

## 2018-12-11 NOTE — Progress Notes (Signed)
PHARMACY - ADULT TOTAL PARENTERAL NUTRITION CONSULT NOTE   Pharmacy Consult for TPN Indication: prolonged ileus  HPI: 68 yoF admitted 6/7 with perforated small bowel from closed-loop obstruction and underwent emergent exploratory laparotomy. Returned to OR on 6/9 and again on 6/14 for closure of abdominal wall.  Patient coded on 12/14/2022 and developed multiorgan failure. Remains intubated & on CRRT. Pressors have been weaned off and she is completing antibiotic course for sepsis/PNA.  NPO since admission on 6/7. Pharmacy consulted to start TPN 6/16. D/t acute liver injury from shock, initiated TPN at low rate & advance slowly as requested by surgery.  Patient Measurements: Height: 5\' 4"  (162.6 cm) Weight: 159 lb 2.8 oz (72.2 kg) IBW/kg (Calculated) : 54.7 TPN AdjBW (KG): 59.7 Body mass index is 27.32 kg/m. Usual Weight: 90kg   Intake/Output Summary (Last 24 hours) at 12/11/2018 0839 Last data filed at 12/11/2018 0800 Gross per 24 hour  Intake 2659.27 ml  Output 3014 ml  Net -354.73 ml    Recent Labs    12/09/18 0455  12/08/2018 0352 12/11/2018 1700 12/11/18 0409  NA 135   < > 137 136 136  K 5.1   < > 4.8 5.1 4.5  CL 99   < > 100 100 102  CO2 21*   < > 24 20* 23  GLUCOSE 147*   < > 116* 102* 124*  BUN 69*   < > 74* 93* 86*  CREATININE 1.49*   < > 1.60* 2.25* 1.94*  CALCIUM 8.2*   < > 7.9* 7.6* 7.4*  PHOS 3.4   < > 3.7 5.7* 3.6  MG 2.4  --  2.5*  --  2.2  ALBUMIN 1.7*   < > 1.7* 1.4* 1.3*  ALKPHOS 173*  --   --   --   --   AST 28  --   --   --   --   ALT 39  --   --   --   --   BILITOT 0.9  --   --   --   --    < > = values in this interval not displayed.    Significant events:  6/18: OK to increase to goal rate per surgery 6/22: CRRT stopped for holiday; CBG 69 overnight, D50 given but TPN continued (55 units insulin in TPN); BS and BM overnight - initiating trickle feeds 6/23: resuming CRRT, return to high-protein TPN; fevers overnight, CCM removing some central lines;  will use chemo port for TPN 6/24: began adding lipids to TPN formula 6/25: trickle feeds stopped d/t abd distention 6/26: CRRT clotted off overnight; patient to OR this AM for ex lap; RN attempting to restart CRRT post-op but still having issues at this time 6/27: CRRT appears to have been successfully resumed overnight   Central access: 11/24/2018 TPN start date: 11/30/18  ASSESSMENT                                                                                                          Current Nutrition: NPO IVF:  On CRRT to keep net neutral I/O  Today:   Glucose (goal 100-150) - Hx DM on 70/30 Novolog 20 units BID PTA.  CBGs still on lower side despite reducing insulin in TPN 45 >. 35 units (range 76-113); suspect lower d/t stopping TFs  35 units insulin in TPN + ICU resistant scale; received only 3 units SSI yesterday  Electrolytes - normalized on CRRT  Renal - AKI; intrinsic renal function still appears to be poor; SCr, bicarb normalized on CRRT, BUN remains elevated  LFTs - enzymes continue to normalize; TBili WNL; albumin remains low (6/25)  TGs - WNL (6/22)  Prealbumin - remains low but improving (6/22)  NUTRITIONAL GOALS                                                                                             RD recs: (6/23 - back on CRRT):  Kcal:1530 kcal, Protein:172-187 grams (2.3-2.5 grams/kg), Fluid:>/= 1.8 L/day  PLAN                                                                                                                          At 1800 today:  Continue TPN at 75 ml/hr  Custom TPN at goal rate of 75 ml/hr  to provide: 171 g/day protein, 1530 Kcal/day.  Electrolytes: continue to hold Mag & K, others unchanged  TPN to contain standard multivitamins, trace elements MWF only due to national backorder.  IVF per MD  Continue ICU resistant-scale glycemic control orders q6h; reduce insulin to 25 units in TPN while TFs held  TPN lab panels Mondays &  Thursdays  Ordered BID renal function panels while on CRRT   Reuel Boom, PharmD, BCPS [phone] 12/11/2018, 8:39 AM

## 2018-12-11 NOTE — Progress Notes (Signed)
1 Day Post-Op   Subjective/Chief Complaint: Intubated and sedated but seems uncomfortable   Objective: Vital signs in last 24 hours: Temp:  [97 F (36.1 C)-100.4 F (38 C)] 97.5 F (36.4 C) (06/27 0630) Pulse Rate:  [89-108] 89 (06/27 0720) Resp:  [15-30] 27 (06/27 0720) BP: (84-172)/(39-64) 115/39 (06/27 0720) SpO2:  [100 %] 100 % (06/27 0422) FiO2 (%):  [30 %] 30 % (06/27 0720) Weight:  [72.2 kg] 72.2 kg (06/27 0400) Last BM Date: 11/30/2018  Intake/Output from previous day: 06/26 0701 - 06/27 0700 In: 3351.3 [I.V.:2583.5; Blood:315; IV Piggyback:452.8] Out: 2827 [Urine:68; Emesis/NG output:295; Drains:565] Intake/Output this shift: No intake/output data recorded.  General appearance: alert and mild distress Resp: clear to auscultation bilaterally and on vent Cardio: regular rate and rhythm GI: soft, tender. vac in place  Lab Results:  Recent Labs    12/14/2018 1415 12/11/18 0409  WBC 12.8* 17.6*  HGB 7.5* 7.1*  HCT 23.3* 21.3*  PLT 108* 121*   BMET Recent Labs    11/26/2018 1700 12/11/18 0409  NA 136 136  K 5.1 4.5  CL 100 102  CO2 20* 23  GLUCOSE 102* 124*  BUN 93* 86*  CREATININE 2.25* 1.94*  CALCIUM 7.6* 7.4*   PT/INR No results for input(s): LABPROT, INR in the last 72 hours. ABG No results for input(s): PHART, HCO3 in the last 72 hours.  Invalid input(s): PCO2, PO2  Studies/Results: Ct Abdomen Pelvis Wo Contrast  Result Date: 12/09/2018 CLINICAL DATA:  History of small bowel perforation. Now with abdominal distention and abdominal pain. EXAM: CT ABDOMEN AND PELVIS WITHOUT CONTRAST TECHNIQUE: Multidetector CT imaging of the abdomen and pelvis was performed following the standard protocol without IV contrast. COMPARISON:  12/04/2018 FINDINGS: Lower chest: Central line tip projects in the upper right atrium. Airspace consolidation noted posterior right lower lobe with collapse/consolidation in the posterior left lower lobe. No substantial pleural  effusion. Hepatobiliary: No focal abnormality in the liver on this study without intravenous contrast. Gallbladder is surgically absent. No intrahepatic or extrahepatic biliary dilation. Pancreas: No focal mass lesion. No dilatation of the main duct. No intraparenchymal cyst. No peripancreatic edema. Spleen: No splenomegaly. No focal mass lesion. Adrenals/Urinary Tract: No adrenal nodule or mass. Kidneys unremarkable on this noncontrast study. No hydroureteronephrosis. Foley catheter decompresses the urinary bladder. Stomach/Bowel: NG tube decompresses the stomach. Duodenum is normally positioned as is the ligament of Treitz. Circumferential wall thickening is noted in proximal jejunal loops in the left upper quadrant (image 40/series 2). Small bowel anastomosis noted anterior left paramidline abdomen. No small bowel dilatation. Small diverticulum noted on the terminal ileum. 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 Vascular/Lymphatic: No abdominal aortic aneurysm. No abdominal lymphadenopathy Reproductive: The uterus is surgically absent. There is no adnexal mass. Other: Interval development of large volume free fluid in the abdomen and pelvis. This is associated with intraperitoneal free air anteriorly under the rectus sheath and bubbles of gas scattered in the central mesentery. Source for the fluid and gas is not readily evident, but anastomotic etiology would be a likely consideration. Musculoskeletal: Some of the free intraperitoneal air has dissected through the anterior midline fascia into the subcutaneous fat of the anterior abdominal wall. Open midline surgical wound noted. Degenerative changes noted in the right hip. No worrisome lytic or sclerotic osseous abnormality. IMPRESSION: 1. Since the study from 5 days ago, the amount of intraperitoneal free fluid has progressed substantially with large volume ascites now evident. This is associated  with prominent  intraperitoneal free air collecting under the anterior abdominal wall with gas scattered in the central small bowel mesentery. Etiology for the fluid and gas not definitely identified. Mild circumferential wall thickening is noted in jejunal loops, proximal to the anastomosis. 2. Patchy airspace disease in the right lower lobe is similar. The left lower lobe collapse seen on the previous study has improved significantly. Critical Value/emergent results were called by telephone at the time of interpretation on 12/09/2018 at 6:03 pm to Dr. [name], who verbally acknowledged these results. Electronically Signed   By: Misty Stanley M.D.   On: 12/09/2018 18:04   Dg Chest Port 1 View  Result Date: 12/11/2018 CLINICAL DATA:  Pleural effusion EXAM: PORTABLE CHEST 1 VIEW COMPARISON:  Four days ago FINDINGS: Endotracheal tube tip just below the clavicular heads. The orogastric tube reaches the stomach. Left port with 2 right-sided lines. Tips terminate at the [facility] or upper cavoatrial junction. Bandlike opacities at the bases. No edema, effusion, or pneumothorax. Normal heart size. IMPRESSION: Stable hardware positioning and lower lobe atelectasis. Electronically Signed   By: Monte Fantasia M.D.   On: 12/11/2018 07:16    Anti-infectives: Anti-infectives (From admission, onward)   Start     Dose/Rate Route Frequency Ordered Stop   12/11/18 1500  anidulafungin (ERAXIS) 100 mg in sodium chloride 0.9 % 100 mL IVPB     100 mg 78 mL/hr over 100 Minutes Intravenous Every 24 hours 11/26/2018 1007     12/11/18 0600  piperacillin-tazobactam (ZOSYN) IVPB 3.375 g     3.375 g 100 mL/hr over 30 Minutes Intravenous Every 6 hours 12/11/18 0317     11/20/2018 1800  piperacillin-tazobactam (ZOSYN) IVPB 2.25 g  Status:  Discontinued     2.25 g 100 mL/hr over 30 Minutes Intravenous Every 6 hours 12/11/2018 1413 12/11/18 0317   12/06/2018 1100  anidulafungin (ERAXIS) 200 mg in sodium chloride 0.9 % 200 mL IVPB     200 mg 78 mL/hr  over 200 Minutes Intravenous  Once 12/09/2018 1007 11/24/2018 1456   11/15/2018 1100  piperacillin-tazobactam (ZOSYN) IVPB 3.375 g     3.375 g 100 mL/hr over 30 Minutes Intravenous  Once 12/12/2018 1027 11/26/2018 1245   12/08/18 1800  vancomycin (VANCOCIN) IVPB 750 mg/150 ml premix  Status:  Discontinued     750 mg 150 mL/hr over 60 Minutes Intravenous Every 48 hours 12/07/18 0822 12/07/18 0903   12/08/18 1000  ceFEPIme (MAXIPIME) 2 g in sodium chloride 0.9 % 100 mL IVPB  Status:  Discontinued     2 g 200 mL/hr over 30 Minutes Intravenous Every 24 hours 12/07/18 0822 12/07/18 0903   12/07/18 2200  ceFEPIme (MAXIPIME) 2 g in sodium chloride 0.9 % 100 mL IVPB  Status:  Discontinued     2 g 200 mL/hr over 30 Minutes Intravenous Every 12 hours 12/07/18 0903 11/20/2018 1027   12/07/18 1800  vancomycin (VANCOCIN) IVPB 750 mg/150 ml premix  Status:  Discontinued     750 mg 150 mL/hr over 60 Minutes Intravenous Every 24 hours 12/07/18 0903 12/09/18 1040   12/06/18 1800  vancomycin (VANCOCIN) IVPB 750 mg/150 ml premix  Status:  Discontinued     750 mg 150 mL/hr over 60 Minutes Intravenous Every 24 hours 12/05/18 1652 12/07/18 0822   12/05/18 1800  ceFEPIme (MAXIPIME) 2 g in sodium chloride 0.9 % 100 mL IVPB  Status:  Discontinued     2 g 200 mL/hr over 30 Minutes Intravenous Every 12  hours 12/05/18 1644 12/07/18 0822   12/05/18 1700  vancomycin (VANCOCIN) 1,250 mg in sodium chloride 0.9 % 250 mL IVPB     1,250 mg 166.7 mL/hr over 90 Minutes Intravenous  Once 12/05/18 1652 12/05/18 2019   11/15/2018 1800  fluconazole (DIFLUCAN) IVPB 200 mg  Status:  Discontinued     200 mg 100 mL/hr over 60 Minutes Intravenous Every 24 hours 12/12/2018 0748 11/19/2018 0847   11/26/2018 1800  fluconazole (DIFLUCAN) IVPB 400 mg  Status:  Discontinued     400 mg 100 mL/hr over 120 Minutes Intravenous Every 24 hours 12/04/2018 0847 11/22/2018 0856   12/04/2018 1400  piperacillin-tazobactam (ZOSYN) IVPB 2.25 g  Status:  Discontinued      2.25 g 100 mL/hr over 30 Minutes Intravenous Every 6 hours 11/26/2018 0748 12/01/18 1104   11/20/2018 0600  cefoTEtan (CEFOTAN) 2 g in sodium chloride 0.9 % 100 mL IVPB     2 g 200 mL/hr over 30 Minutes Intravenous On call to O.R. 11/22/18 1300 11/30/2018 0624   11/22/18 1800  fluconazole (DIFLUCAN) IVPB 400 mg  Status:  Discontinued     400 mg 100 mL/hr over 120 Minutes Intravenous Every 24 hours 11/30/2018 1528 12/01/2018 0748   11/22/18 1800  vancomycin (VANCOCIN) 1,500 mg in sodium chloride 0.9 % 500 mL IVPB  Status:  Discontinued     1,500 mg 250 mL/hr over 120 Minutes Intravenous Every 24 hours 11/22/18 0358 11/22/18 0750   11/22/18 0115  vancomycin (VANCOCIN) 1,500 mg in sodium chloride 0.9 % 500 mL IVPB     1,500 mg 250 mL/hr over 120 Minutes Intravenous  Once 11/22/18 0101 11/22/18 0319   11/20/2018 1630  fluconazole (DIFLUCAN) IVPB 800 mg     800 mg 100 mL/hr over 240 Minutes Intravenous  Once 11/24/2018 1527 11/16/2018 2013   11/19/2018 1600  piperacillin-tazobactam (ZOSYN) IVPB 3.375 g  Status:  Discontinued     3.375 g 12.5 mL/hr over 240 Minutes Intravenous Every 8 hours 11/19/2018 1522 11/20/2018 0748   12/14/2018 0545  piperacillin-tazobactam (ZOSYN) IVPB 3.375 g     3.375 g 100 mL/hr over 30 Minutes Intravenous  Once 11/19/2018 0530 11/26/2018 0626   11/22/2018 0530  piperacillin-tazobactam (ZOSYN) IVPB 4.5 g  Status:  Discontinued     4.5 g 200 mL/hr over 30 Minutes Intravenous  Once 11/20/2018 0517 11/24/2018 0529      Assessment/Plan: s/p Procedure(s): EXPLORATORY LAPAROTOMY RESECTION OF PREVIOUS ANASTAMOSIS (N/A) APPLICATION OF WOUND VAC (N/A) Continue ng and bowel rest  HTN HLD DM Hypothyroidism 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 Ninfa Linden- POD #21 2. S/pReexploration of recent laparotomyWITH [facility] OUT (N/A)6/9 Dr. Rosendo Gros- POD #19 3. S/pExploratory laparotomy with washout of abdomen and placement of vacuum VAC dressing6/11 Dr. Brantley Stage- POD #17 4. Exploratory laparotomy with anastomosis of SB and closure of abdominal wall 6/14 Dr. Brantley Stage- POD #14 -ex lap, resection anastamosis and vac. Pod 1. Will need to go back on monday -cont TNA for now -Cont vac to midline wound -heparin drip -T&S for possible need for transfusion.  hgb 7.5 today  ID -diflucan 6/7-6/11, vancomycin 6/8-6/8.zosyn 6/7>>6/21, Cefepime 6/21 --> FEN -IVF, NGT,TNA  VTE -SCDs, heparingtt Foley -  in place Contact - brother [phone] or [phone]) - pt is full code  LOS: 20 days    Autumn Messing III 12/11/2018

## 2018-12-11 NOTE — Progress Notes (Signed)
[place] for IV heparin Indication: IJ DVT  No Known Allergies  Patient Measurements: Height: _0  (162.6 cm) Weight: 160 lb 11.5 oz (72.9 kg) IBW/kg (Calculated) : 54.7 Heparin Dosing Weight: 71 kg  Vital Signs: Temp: 97.7 F (36.5 C) (06/27 0300) Temp Source: Bladder (06/27 0000) BP: 100/52 (06/27 0300) Pulse Rate: 90 (06/27 0000)  Labs: Recent Labs    12/08/18 0509  12/09/18 0004 12/09/18 0455 12/09/18 0900  11/22/2018 0352 12/09/2018 1415 11/24/2018 1700 12/11/18 0409  HGB 7.7*  --   --  8.1*  --   --  7.5* 7.5*  --  7.1*  HCT 23.7*  --   --  24.6*  --   --  23.0* 23.3*  --  21.3*  PLT 83*  --   --  111*  --   --  122* 108*  --  121*  APTT 39*  --   --  85*  --   --   --   --   --  99*  HEPARINUNFRC  --   --  0.22*  --  0.35  --   --   --   --  0.27*  CREATININE 1.84*   < >  --  1.49*  --    < > 1.60*  --  2.25* 1.94*   < > = values in this interval not displayed.    Estimated Creatinine Clearance: 28.7 mL/min (A) (by C-G formula based on SCr of 1.94 mg/dL (H)).  Scheduled:  . chlorhexidine gluconate (MEDLINE KIT)  15 mL Mouth Rinse BID  . Chlorhexidine Gluconate Cloth  6 each Topical Daily  . dorzolamide-timolol  1 drop Both Eyes BID  . heparin lock flush  500 Units Intravenous Once  . insulin aspart  3-9 Units Subcutaneous Q6H  . mouth rinse  15 mL Mouth Rinse 10 times per day  . pantoprazole (PROTONIX) IV  40 mg Intravenous Q24H   Infusions:  .  prismasol BGK 4/2.5 400 mL/hr at 12/11/18 0135  .  prismasol BGK 4/2.5 200 mL/hr at 11/15/2018 2100  . sodium chloride 10 mL/hr at 12/11/18 0300  . anidulafungin    . fentaNYL infusion INTRAVENOUS 50 mcg/hr (12/11/18 0300)  . heparin    . norepinephrine (LEVOPHED) Adult infusion 2 mcg/min (12/11/18 0300)  . piperacillin-tazobactam (ZOSYN)  IV    . prismasol BGK 4/2.5 1,500 mL/hr at 12/11/18 0228  . TPN ADULT (ION) 75 mL/hr at 12/11/18 0300   PRN: sodium chloride, albuterol,  alteplase, fentaNYL (SUBLIMAZE) injection, heparin, hydrALAZINE, metoprolol tartrate, sodium chloride, sodium chloride flush  Assessment: 51 yoF known to pharmacy from TPN and antibiotic dosing; complicated hospital course including cardiac arrest, intubation, VAP, and CRRT, now further complicated with extensive IJ clot (only L sided catheter removed 6/9). Pharmacy to dose heparin infusion.   Baseline INR, aPTT: baseline INR/aPTT mildly elevated but did have recent acute liver injury  Prior anticoagulation: none  Significant events: 6/25: heparin held in PM for OR tomorrow 6/26: ex lap for perforated viscus repair, PRBC given during procedure  6/26  CBC: anemia of acute/chronic illness (and likely also d/t CRRT) requiring multiple transfusions; slightly lower than yesterday and given PRBC in OR; Plt low but improving  Most recent heparin level therapeutic but low end of range on 1400 units/hr; rate increased to 1450 units/hr but no level rechecked d/t turning off heparin for OR 6/26  No bleeding or infusion issues per nursing, no heparin or other  anticoagulation running in CRRT circuit  Per surgery, resume heparin tonight at 8p w/o bolus Today, 6/27  0444 HL = 0.27 below goal, no infusion or bleeding issues per RN.   Goal of Therapy: Heparin level 0.3-0.7 units/ml Monitor platelets by anticoagulation protocol: Yes  Plan:  Increase heparin drip to 1600 units/hr  Recheck HL in 8 hours  Daily CBC, daily heparin level once stable  Monitor for signs of bleeding or thrombosis    Dorrene German 12/11/2018, 4:57 AM

## 2018-12-11 NOTE — Progress Notes (Signed)
[place] for IV heparin Indication: IJ DVT  No Known Allergies  Patient Measurements: Height: _0  (162.6 cm) Weight: 159 lb 2.8 oz (72.2 kg) IBW/kg (Calculated) : 54.7 Heparin Dosing Weight: 71 kg  Vital Signs: Temp: 98.6 F (37 C) (06/27 1400) Temp Source: Core (06/27 1400) BP: 109/45 (06/27 1400) Pulse Rate: 77 (06/27 1127)  Labs: Recent Labs    12/09/18 0455 12/09/18 0900  11/29/2018 0352 11/22/2018 1415 11/19/2018 1700 12/11/18 0409 12/11/18 1256  HGB 8.1*  --   --  7.5* 7.5*  --  7.1*  --   HCT 24.6*  --   --  23.0* 23.3*  --  21.3*  --   PLT 111*  --   --  122* 108*  --  121*  --   APTT 85*  --   --   --   --   --  99*  --   HEPARINUNFRC  --  0.35  --   --   --   --  0.27* 0.35  CREATININE 1.49*  --    < > 1.60*  --  2.25* 1.94*  --    < > = values in this interval not displayed.    Estimated Creatinine Clearance: 28.5 mL/min (A) (by C-G formula based on SCr of 1.94 mg/dL (H)).  Scheduled:  . chlorhexidine gluconate (MEDLINE KIT)  15 mL Mouth Rinse BID  . Chlorhexidine Gluconate Cloth  6 each Topical Daily  . dorzolamide-timolol  1 drop Both Eyes BID  . heparin lock flush  500 Units Intravenous Once  . insulin aspart  3-9 Units Subcutaneous Q6H  . levothyroxine  12.5 mcg Intravenous Daily  . mouth rinse  15 mL Mouth Rinse 10 times per day  . pantoprazole (PROTONIX) IV  40 mg Intravenous Q24H   Infusions:  .  prismasol BGK 4/2.5 400 mL/hr at 12/11/18 1407  .  prismasol BGK 4/2.5 200 mL/hr at 12/11/18 0450  . sodium chloride 10 mL/hr at 12/11/18 1400  . sodium chloride Stopped (12/11/18 1243)  . anidulafungin    . fentaNYL infusion INTRAVENOUS 50 mcg/hr (12/11/18 1400)  . heparin 1,600 Units/hr (12/11/18 1400)  . norepinephrine (LEVOPHED) Adult infusion 9 mcg/min (12/11/18 1400)  . piperacillin-tazobactam (ZOSYN)  IV Stopped (12/11/18 1241)  . prismasol BGK 4/2.5 1,500 mL/hr at 12/11/18 1240  . TPN ADULT (ION) 75  mL/hr at 12/11/18 1400  . TPN ADULT (ION)     PRN: sodium chloride, sodium chloride, albuterol, alteplase, fentaNYL (SUBLIMAZE) injection, heparin, hydrALAZINE, metoprolol tartrate, sodium chloride, sodium chloride flush  Assessment: 89 yoF known to pharmacy from TPN and antibiotic dosing; complicated hospital course including cardiac arrest, intubation, VAP, and CRRT, now further complicated with extensive IJ clot (only L sided catheter removed 6/9). Pharmacy to dose heparin infusion.   Baseline INR, aPTT: baseline INR/aPTT mildly elevated but did have recent acute liver injury  Prior anticoagulation: none  Significant events: 6/25: heparin held in PM for OR tomorrow 6/26: ex lap for perforated viscus repair, PRBC given during procedure; heparin resumed 8p  Today, 12/11/2018:  CBC: anemia of acute/chronic illness (and likely also d/t CRRT) requiring multiple transfusions; slightly lower than yesterday and given PRBC in OR; Plt low but improving  Most recent heparin level therapeutic but low end of range on 1400 units/hr; rate increased to 1450 units/hr but no level rechecked d/t turning off heparin for OR 6/26  No bleeding or infusion issues per nursing, no heparin or  other anticoagulation running in CRRT circuit  Goal of Therapy: Heparin level 0.3-0.7 units/ml Monitor platelets by anticoagulation protocol: Yes  Plan:  Increase heparin to 1650 units/hr  Check 8-hr confirmatory heparin level  Daily CBC, daily heparin level once stable  Monitor for signs of bleeding or thrombosis   Reuel Boom, PharmD, BCPS [phone] 12/11/2018, 2:24 PM

## 2018-12-12 DIAGNOSIS — D696 Thrombocytopenia, unspecified: Secondary | ICD-10-CM | POA: Diagnosis present

## 2018-12-12 DIAGNOSIS — J9601 Acute respiratory failure with hypoxia: Secondary | ICD-10-CM | POA: Diagnosis present

## 2018-12-12 DIAGNOSIS — G928 Other toxic encephalopathy: Secondary | ICD-10-CM | POA: Diagnosis present

## 2018-12-12 DIAGNOSIS — G92 Toxic encephalopathy: Secondary | ICD-10-CM | POA: Diagnosis present

## 2018-12-12 DIAGNOSIS — J9602 Acute respiratory failure with hypercapnia: Secondary | ICD-10-CM

## 2018-12-12 LAB — RENAL FUNCTION PANEL
Albumin: 1.3 g/dL — ABNORMAL LOW (ref 3.5–5.0)
Albumin: 1.5 g/dL — ABNORMAL LOW (ref 3.5–5.0)
Anion gap: 8 (ref 5–15)
Anion gap: 10 (ref 5–15)
BUN: 58 mg/dL — ABNORMAL HIGH (ref 8–23)
BUN: 48 mg/dL — ABNORMAL HIGH (ref 8–23)
CO2: 25 mmol/L (ref 22–32)
CO2: 27 mmol/L (ref 22–32)
Calcium: 7.5 mg/dL — ABNORMAL LOW (ref 8.9–10.3)
Calcium: 7.5 mg/dL — ABNORMAL LOW (ref 8.9–10.3)
Chloride: 102 mmol/L (ref 98–111)
Chloride: 99 mmol/L (ref 98–111)
Creatinine, Ser: 1.36 mg/dL — ABNORMAL HIGH (ref 0.44–1.00)
Creatinine, Ser: 1.29 mg/dL — ABNORMAL HIGH (ref 0.44–1.00)
GFR calc Af Amer: 48 mL/min — ABNORMAL LOW (ref >60)
GFR calc Af Amer: 51 mL/min — ABNORMAL LOW (ref >60)
GFR calc non Af Amer: 41 mL/min — ABNORMAL LOW (ref >60)
GFR calc non Af Amer: 44 mL/min — ABNORMAL LOW (ref >60)
Glucose, Bld: 115 mg/dL — ABNORMAL HIGH (ref 70–99)
Glucose, Bld: 153 mg/dL — ABNORMAL HIGH (ref 70–99)
Phosphorus: 2 mg/dL — ABNORMAL LOW (ref 2.5–4.6)
Phosphorus: 4.6 mg/dL (ref 2.5–4.6)
Potassium: 4 mmol/L (ref 3.5–5.1)
Potassium: 3.9 mmol/L (ref 3.5–5.1)
Sodium: 135 mmol/L (ref 135–145)
Sodium: 136 mmol/L (ref 135–145)

## 2018-12-12 LAB — CULTURE, BLOOD (ROUTINE X 2)
Culture: NO GROWTH
Special Requests: ADEQUATE

## 2018-12-12 LAB — PREPARE RBC (CROSSMATCH)

## 2018-12-12 LAB — CBC
HCT: 21.1 % — ABNORMAL LOW (ref 36.0–46.0)
Hemoglobin: 6.6 g/dL — CL (ref 12.0–15.0)
MCH: 29.6 pg (ref 26.0–34.0)
MCHC: 31.3 g/dL (ref 30.0–36.0)
MCV: 94.6 fL (ref 80.0–100.0)
Platelets: 128 10*3/uL — ABNORMAL LOW (ref 150–400)
RBC: 2.23 MIL/uL — ABNORMAL LOW (ref 3.87–5.11)
RDW: 17.2 % — ABNORMAL HIGH (ref 11.5–15.5)
WBC: 20.1 10*3/uL — ABNORMAL HIGH (ref 4.0–10.5)
nRBC: 0.2 % (ref 0.0–0.2)

## 2018-12-12 LAB — HEPARIN LEVEL (UNFRACTIONATED): Heparin Unfractionated: 0.43 IU/mL (ref 0.30–0.70)

## 2018-12-12 LAB — HEMOGLOBIN AND HEMATOCRIT, BLOOD
HCT: 30 % — ABNORMAL LOW (ref 36.0–46.0)
Hemoglobin: 9.7 g/dL — ABNORMAL LOW (ref 12.0–15.0)

## 2018-12-12 LAB — GLUCOSE, CAPILLARY
Glucose-Capillary: 110 mg/dL — ABNORMAL HIGH (ref 70–99)
Glucose-Capillary: 111 mg/dL — ABNORMAL HIGH (ref 70–99)
Glucose-Capillary: 111 mg/dL — ABNORMAL HIGH (ref 70–99)
Glucose-Capillary: 142 mg/dL — ABNORMAL HIGH (ref 70–99)
Glucose-Capillary: 94 mg/dL (ref 70–99)

## 2018-12-12 LAB — MAGNESIUM: Magnesium: 2.2 mg/dL (ref 1.7–2.4)

## 2018-12-12 LAB — APTT: aPTT: 167 seconds (ref 24–36)

## 2018-12-12 MED ORDER — SODIUM CHLORIDE 0.9% IV SOLUTION: Freq: Once | INTRAVENOUS | Status: DC

## 2018-12-12 MED ORDER — SODIUM PHOSPHATES 45 MMOLE/15ML IV SOLN
15.0000 mmol | Freq: Once | INTRAVENOUS | Status: AC
  Administered 2018-12-12: 15 mmol via INTRAVENOUS
  Filled 2018-12-12: qty 5

## 2018-12-12 MED ORDER — STERILE WATER FOR INJECTION IV SOLN
INTRAVENOUS | Status: AC
  Administered 2018-12-12: 17:00:00 via INTRAVENOUS
  Filled 2018-12-12: qty 1140

## 2018-12-12 NOTE — H&P (View-Only) (Signed)
2 Days Post-Op   Subjective/Chief Complaint: Intubated and sedated on vent   Objective: Vital signs in last 24 hours: Temp:  [97.7 F (36.5 C)-99.5 F (37.5 C)] 99.1 F (37.3 C) (06/28 0700) Pulse Rate:  [72-87] 80 (06/28 0710) Resp:  [16-32] 20 (06/28 0710) BP: (73-180)/(30-65) 109/45 (06/28 0710) SpO2:  [99 %-100 %] 100 % (06/28 0710) FiO2 (%):  [30 %] 30 % (06/28 0710) Last BM Date: 12/11/18  Intake/Output from previous day: 06/27 0701 - 06/28 0700 In: 3337.7 [I.V.:2974.6; IV Piggyback:338.1] Out: 3873 [Urine:70; Emesis/NG output:510; Drains:195] Intake/Output this shift: Total I/O In: -  Out: 129 [Other:129]  General appearance: sedated on vent. arouses Resp: clear to auscultation bilaterally and on vent Cardio: regular rate and rhythm GI: soft, tender. vac intact  Lab Results:  Recent Labs    12/11/18 0409 12/12/18 0545  WBC 17.6* 20.1*  HGB 7.1* 6.6*  HCT 21.3* 21.1*  PLT 121* 128*   BMET Recent Labs    12/11/18 1600 12/12/18 0545  NA 136 135  K 4.1 4.0  CL 103 102  CO2 25 25  GLUCOSE 142* 115*  BUN 65* 58*  CREATININE 1.48* 1.36*  CALCIUM 7.3* 7.5*   PT/INR No results for input(s): LABPROT, INR in the last 72 hours. ABG No results for input(s): PHART, HCO3 in the last 72 hours.  Invalid input(s): PCO2, PO2  Studies/Results: Dg Chest Port 1 View  Result Date: 12/11/2018 CLINICAL DATA:  Pleural effusion EXAM: PORTABLE CHEST 1 VIEW COMPARISON:  Four days ago FINDINGS: Endotracheal tube tip just below the clavicular heads. The orogastric tube reaches the stomach. Left port with 2 right-sided lines. Tips terminate at the [facility] or upper cavoatrial junction. Bandlike opacities at the bases. No edema, effusion, or pneumothorax. Normal heart size. IMPRESSION: Stable hardware positioning and lower lobe atelectasis. Electronically Signed   By: Monte Fantasia M.D.   On: 12/11/2018 07:16    Anti-infectives: Anti-infectives (From admission, onward)   Start     Dose/Rate Route Frequency Ordered Stop   12/11/18 1500  anidulafungin (ERAXIS) 100 mg in sodium chloride 0.9 % 100 mL IVPB     100 mg 78 mL/hr over 100 Minutes Intravenous Every 24 hours 12/07/2018 1007     12/11/18 0600  piperacillin-tazobactam (ZOSYN) IVPB 3.375 g     3.375 g 100 mL/hr over 30 Minutes Intravenous Every 6 hours 12/11/18 0317     11/16/2018 1800  piperacillin-tazobactam (ZOSYN) IVPB 2.25 g  Status:  Discontinued     2.25 g 100 mL/hr over 30 Minutes Intravenous Every 6 hours 12/09/2018 1413 12/11/18 0317   12/12/2018 1100  anidulafungin (ERAXIS) 200 mg in sodium chloride 0.9 % 200 mL IVPB     200 mg 78 mL/hr over 200 Minutes Intravenous  Once 11/17/2018 1007 11/15/2018 1456   12/03/2018 1100  piperacillin-tazobactam (ZOSYN) IVPB 3.375 g     3.375 g 100 mL/hr over 30 Minutes Intravenous  Once 12/09/2018 1027 11/20/2018 1245   12/08/18 1800  vancomycin (VANCOCIN) IVPB 750 mg/150 ml premix  Status:  Discontinued     750 mg 150 mL/hr over 60 Minutes Intravenous Every 48 hours 12/07/18 0822 12/07/18 0903   12/08/18 1000  ceFEPIme (MAXIPIME) 2 g in sodium chloride 0.9 % 100 mL IVPB  Status:  Discontinued     2 g 200 mL/hr over 30 Minutes Intravenous Every 24 hours 12/07/18 0822 12/07/18 0903   12/07/18 2200  ceFEPIme (MAXIPIME) 2 g in sodium chloride 0.9 % 100 mL  IVPB  Status:  Discontinued     2 g 200 mL/hr over 30 Minutes Intravenous Every 12 hours 12/07/18 0903 11/29/2018 1027   12/07/18 1800  vancomycin (VANCOCIN) IVPB 750 mg/150 ml premix  Status:  Discontinued     750 mg 150 mL/hr over 60 Minutes Intravenous Every 24 hours 12/07/18 0903 12/09/18 1040   12/06/18 1800  vancomycin (VANCOCIN) IVPB 750 mg/150 ml premix  Status:  Discontinued     750 mg 150 mL/hr over 60 Minutes Intravenous Every 24 hours 12/05/18 1652 12/07/18 0822   12/05/18 1800  ceFEPIme (MAXIPIME) 2 g in sodium chloride 0.9 % 100 mL IVPB  Status:  Discontinued     2 g 200 mL/hr over 30 Minutes Intravenous Every  12 hours 12/05/18 1644 12/07/18 0822   12/05/18 1700  vancomycin (VANCOCIN) 1,250 mg in sodium chloride 0.9 % 250 mL IVPB     1,250 mg 166.7 mL/hr over 90 Minutes Intravenous  Once 12/05/18 1652 12/05/18 2019   11/15/2018 1800  fluconazole (DIFLUCAN) IVPB 200 mg  Status:  Discontinued     200 mg 100 mL/hr over 60 Minutes Intravenous Every 24 hours 11/20/2018 0748 11/22/2018 0847   12/06/2018 1800  fluconazole (DIFLUCAN) IVPB 400 mg  Status:  Discontinued     400 mg 100 mL/hr over 120 Minutes Intravenous Every 24 hours 12/06/2018 0847 12/04/2018 0856   11/30/2018 1400  piperacillin-tazobactam (ZOSYN) IVPB 2.25 g  Status:  Discontinued     2.25 g 100 mL/hr over 30 Minutes Intravenous Every 6 hours 11/22/2018 0748 12/01/18 1104   12/01/2018 0600  cefoTEtan (CEFOTAN) 2 g in sodium chloride 0.9 % 100 mL IVPB     2 g 200 mL/hr over 30 Minutes Intravenous On call to O.R. 11/22/18 1300 12/14/2018 0624   11/22/18 1800  fluconazole (DIFLUCAN) IVPB 400 mg  Status:  Discontinued     400 mg 100 mL/hr over 120 Minutes Intravenous Every 24 hours 12/09/2018 1528 11/24/2018 0748   11/22/18 1800  vancomycin (VANCOCIN) 1,500 mg in sodium chloride 0.9 % 500 mL IVPB  Status:  Discontinued     1,500 mg 250 mL/hr over 120 Minutes Intravenous Every 24 hours 11/22/18 0358 11/22/18 0750   11/22/18 0115  vancomycin (VANCOCIN) 1,500 mg in sodium chloride 0.9 % 500 mL IVPB     1,500 mg 250 mL/hr over 120 Minutes Intravenous  Once 11/22/18 0101 11/22/18 0319   11/26/2018 1630  fluconazole (DIFLUCAN) IVPB 800 mg     800 mg 100 mL/hr over 240 Minutes Intravenous  Once 12/03/2018 1527 11/17/2018 2013   12/09/2018 1600  piperacillin-tazobactam (ZOSYN) IVPB 3.375 g  Status:  Discontinued     3.375 g 12.5 mL/hr over 240 Minutes Intravenous Every 8 hours 11/25/2018 1522 12/12/2018 0748   11/20/2018 0545  piperacillin-tazobactam (ZOSYN) IVPB 3.375 g     3.375 g 100 mL/hr over 30 Minutes Intravenous  Once 12/07/2018 0530 11/30/2018 0626   11/17/2018 0530   piperacillin-tazobactam (ZOSYN) IVPB 4.5 g  Status:  Discontinued     4.5 g 200 mL/hr over 30 Minutes Intravenous  Once 12/06/2018 0517 12/14/2018 0529      Assessment/Plan: s/p Procedure(s): EXPLORATORY LAPAROTOMY RESECTION OF PREVIOUS ANASTAMOSIS (N/A) APPLICATION OF WOUND VAC (N/A) continue bowel rest  tpn for nutrition s/p Procedure(s): EXPLORATORY LAPAROTOMY RESECTION OF PREVIOUS ANASTAMOSIS (N/A) APPLICATION OF WOUND VAC (N/A) Continue ng and bowel rest  HTN HLD DM Hypothyroidism 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 Ninfa Linden- POD #21 2. S/pReexploration of recent laparotomyWITH [facility] OUT (N/A)6/9 Dr. Rosendo Gros- POD #19 3. S/pExploratory laparotomy with washout of abdomen and placement of vacuum VAC dressing6/11 Dr. Brantley Stage- POD #17 4. Exploratory laparotomy with anastomosis of SB and closure of abdominal wall 6/14 Dr. Brantley Stage- POD #14 -ex lap, resection anastamosis and vac. Pod 2. Will need to go back on monday -cont TNA for now -Cont vac to midline wound -heparin drip ID -diflucan 6/7-6/11, vancomycin 6/8-6/8.zosyn 6/7>>6/21, Cefepime 6/21 --> FEN -IVF, NGT,TNA  VTE -SCDs, heparingtt Foley -in place Contact - brother GeorgeMattier([phone] or [phone]) - pt is full code  LOS: 21 days    Carmen Stone 12/12/2018

## 2018-12-12 NOTE — Progress Notes (Signed)
[facility] Progress Note   Subjective:    no new changes overnight   Objective:   BP (!) 113/37 (BP Location: Left Leg)   Pulse 78   Temp 99.1 F (37.3 C) (Bladder)   Resp 16   Ht '5\' 4"'  (1.626 m)   Wt 72.2 kg   SpO2 100%   BMI 27.32 kg/m   Intake/Output Summary (Last 24 hours) at 12/12/2018 0845 Last data filed at 12/12/2018 0800 Gross per 24 hour  Intake 3214.79 ml  Output 3815 ml  Net -600.21 ml   Weight change:   Physical Exam: Genintubated, sedated , grimacing NECK: no JVD  PULM: clear bilat CV: RRR no MRG ABD: soft, dressings c/d/i GUfoley cath minimal urine Ext: 1+ hip edema,  2+ LUE edema w dvt Neuro: intubated   Assessment/ Plan:    1. Acute renal failure: last known baseline 0.79 05/2018.  She has acute oliguric AKI/ ATN  in the setting of septic shock due to perf bowel.  Got CRRT 6/9 - 6/12, held after PEA arrest.  Resumed 6/14 after abdominal closure - no sign of renal recovery yet - on TNA w/ large volume load , needs continued CRRT for now - is on systemic IV heparin for dvt - I/O even yest, wt's down or stable, cont to keep even   2. Perforated small bowel due to closed-loop SBO / septic shock- per CCM/ gen surg, s/p closure 6/14.  New recent bowel leak due to non-healing of anastomosis. Went to OR 6/26 for resection of prior anastamosis and was not reconnected.  - going back to OR tomorrow - remains on TNA - has large wound VAC  3. Acute hypoxic respiratory failure: reintubated 6/20, CT head negative  4. DM: per primary  5. Takatsubo's cardiomyopathy: EF ~ 20%  6. Severe hypoalbuminemia: per primary-- on TPN  7. SP PEA arrest: on 6/13  Kelly Splinter, MD 12/06/2018, 3:16 PM    Labs: BMET Recent Labs  Lab 12/09/18 0455 12/09/18 1558 11/22/2018 0352 11/24/2018 1700 12/11/18 0409 12/11/18 1600 12/12/18 0545  NA 135 136 137 136 136 136 135  K 5.1 5.0 4.8 5.1 4.5 4.1 4.0  CL 99 100 100 100 102 103 102  CO2 21* 25 24  20* '23 25 25  ' GLUCOSE 147* 126* 116* 102* 124* 142* 115*  BUN 69* 68* 74* 93* 86* 65* 58*  CREATININE 1.49* 1.61* 1.60* 2.25* 1.94* 1.48* 1.36*  CALCIUM 8.2* 8.2* 7.9* 7.6* 7.4* 7.3* 7.5*  PHOS 3.4 3.5 3.7 5.7* 3.6 2.5 2.0*   CBC Recent Labs  Lab 12/06/18 0228  12/06/2018 0352 12/09/2018 1415 12/11/18 0409 12/12/18 0545  WBC 24.0*   < > 15.2* 12.8* 17.6* 20.1*  NEUTROABS 21.2*  --   --   --   --   --   HGB 7.5*   < > 7.5* 7.5* 7.1* 6.6*  HCT 23.0*   < > 23.0* 23.3* 21.3* 21.1*  MCV 90.9   < > 94.7 95.1 93.4 94.6  PLT 85*   < > 122* 108* 121* 128*   < > = values in this interval not displayed.    Medications:    . chlorhexidine gluconate (MEDLINE KIT)  15 mL Mouth Rinse BID  . Chlorhexidine Gluconate Cloth  6 each Topical Daily  . dorzolamide-timolol  1 drop Both Eyes BID  . heparin lock flush  500 Units Intravenous Once  . insulin aspart  3-9 Units Subcutaneous Q6H  . levothyroxine  12.5 mcg Intravenous Daily  . mouth rinse  15 mL Mouth Rinse 10 times per day  . pantoprazole (PROTONIX) IV  40 mg Intravenous Q24H

## 2018-12-12 NOTE — Progress Notes (Signed)
PHARMACY - ADULT TOTAL PARENTERAL NUTRITION CONSULT NOTE   Pharmacy Consult for TPN Indication: prolonged ileus  HPI: 38 yoF admitted 6/7 with perforated small bowel from closed-loop obstruction and underwent emergent exploratory laparotomy. Returned to OR on 6/9 and again on 6/14 for closure of abdominal wall.  Patient coded on 12-05-22 and developed multiorgan failure. Remains intubated & on CRRT. Pressors have been weaned off and she is completing antibiotic course for sepsis/PNA.  NPO since admission on 6/7. Pharmacy consulted to start TPN 6/16. D/t acute liver injury from shock, initiated TPN at low rate & advance slowly as requested by surgery.  Patient Measurements: Height: 5\' 4"  (162.6 cm) Weight: 159 lb 2.8 oz (72.2 kg) IBW/kg (Calculated) : 54.7 TPN AdjBW (KG): 59.7 Body mass index is 27.32 kg/m. Usual Weight: 90kg   Intake/Output Summary (Last 24 hours) at 12/12/2018 0757 Last data filed at 12/12/2018 0700 Gross per 24 hour  Intake 3337.72 ml  Output 3873 ml  Net -535.28 ml    Recent Labs    12/11/18 0409 12/11/18 1600 12/12/18 0545  NA 136 136 135  K 4.5 4.1 4.0  CL 102 103 102  CO2 23 25 25   GLUCOSE 124* 142* 115*  BUN 86* 65* 58*  CREATININE 1.94* 1.48* 1.36*  CALCIUM 7.4* 7.3* 7.5*  PHOS 3.6 2.5 2.0*  MG 2.2  --  2.2  ALBUMIN 1.3* 1.3* 1.3*    Significant events:  6/18: OK to increase to goal rate per surgery 6/22: CRRT stopped for holiday; CBG 69 overnight, D50 given but TPN continued (55 units insulin in TPN); BS and BM overnight - initiating trickle feeds 6/23: resuming CRRT, return to high-protein TPN; fevers overnight, CCM removing some central lines; will use chemo port for TPN 6/24: began adding lipids to TPN formula 6/25: trickle feeds stopped d/t abd distention 6/26: CRRT clotted off overnight; patient to OR this AM for ex lap; RN attempting to restart CRRT post-op but still having issues at this time 6/27: CRRT appears to have been successfully  resumed overnight   Central access: 12/09/2018 TPN start date: 11/30/18  ASSESSMENT                                                                                                          Current Nutrition: NPO IVF: On CRRT to keep net neutral I/O  Today:   Glucose (goal 100-150) - Hx DM on 70/30 Novolog 20 units BID PTA.  CBGs well controlled (range 110-113)  25 units insulin in TPN + ICU resistant scale; no SSI required yesterday  Electrolytes - Na slowly trending down, Phos now low on CRRT  Renal - AKI; intrinsic renal function still appears to be poor; SCr, bicarb normalized on CRRT, BUN remains elevated  LFTs - enzymes continue to normalize; TBili WNL; albumin remains low (6/25)  TGs - WNL (6/22)  Prealbumin - remains low but improving (6/22)  NUTRITIONAL GOALS  RD recs: (6/23 - back on CRRT):  Kcal:1530 kcal, Protein:172-187 grams (2.3-2.5 grams/kg), Fluid:>/= 1.8 L/day  PLAN                                                                                                                          15 mmol NaPhos IV x 1  At 1800 today:  Continue TPN at 75 ml/hr  Custom TPN at goal rate of 75 ml/hr  to provide: 171 g/day protein, 1530 Kcal/day.  Electrolytes: continue to hold Mag, add back small K, increase Na and Phos; Cl:Ac = 1:2  TPN to contain standard multivitamins, trace elements MWF only due to national backorder.  IVF per MD  Continue ICU resistant-scale glycemic control orders q6h; continue 25 units regular insulin in TPN while TFs held  TPN lab panels Mondays & Thursdays  Ordered BID renal function panels while on CRRT   Reuel Boom, PharmD, BCPS [phone] 12/12/2018, 7:57 AM

## 2018-12-12 NOTE — Progress Notes (Signed)
NAME:  Carmen Stone, MRN:  [id number], DOB:  12-Mar-1956, LOS: 21 ADMISSION DATE:  11/30/2018, CONSULTATION DATE:  11/21/2018 REFERRING MD:  Dr. Ninfa Linden, Surgery, CHIEF COMPLAINT:  Abdominal pain   Brief History   63 y/o female presented to ER 12/11/2018 with nausea, vomiting, abdominal pain.  CT abd/pelvis showed focal perforated loop of SB with free air with multiple complex ventral hernias.  Underwent emergent laparotomy with SB resection, lysis of adhesions and wound vac.  Remained on vent and pressors post op.  Hospital course complicated by respiratory arrest 6/13, multiple reintubations, intermittent vasopressor needs.    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 Changing volume goal on CRRT to -100 cc an hour. Weaned 7 hours PSV   6/16 Encephalopathic. Hypertensive, adding labetalol.  IV TNA started 6/17 A little more awake.  Tolerating PSV but SBI exceeds 105.  Volume removal with CVVHD 6/18 Neuro status continues to improve.  Hemodynamics stable however did require brief vasoactive drips during nighttime.  She is now at an net negative fluid volume status.  Likely nearing euvolemic state.  PSV 6/19 Extubated 6/20 reintubated for altered mentation, head CT negative  6/21 Tx PRBC x1.  Off vasopressors 6/23: Febrile overnight, Blood Cx. Transfuse 1 U PRBC 6/24: Afebrile. Remains off pressors. 100cc/hr removal /c CRRT. 30% FiO2.  6/25: L IJ, subclavian, axillary  + DVT. Heparin initiated. Remains on CRRT.  6/26:  CT ABD/pelvis positive for free air and ascites. Back  to OR  for ex lap of perforated viscous, resection of previous anastomosis, application of wound vac. 1U RBCs intra op.    Consults:  PCCM 6/7 Renal 6/09 AKI, acidosis Cardiology 6/09 Takotsubo CM   Procedures:  ETT 6/07 >> 6/19, 6/20 >> Rt IJ HD 6/09 >> Rt [facility] CVL 6/09 >> Lt radial aline 6/09 >>Discontinued  Significant Diagnostic Tests:  CT abd/pelvis 6/07 >> focal perforated loop of SB with free air with multiple complex ventral hernias Echo 6/08 >> EF 20%, Takotsubo CM CT head 6/20 >> negative for any acute findings CT abdomen 6/20 >> bilateral effusions, atelectasis left lower lobe, results noted.    CT Chest 6/20 >> small bilateral pleural effusions with compressive atelectasis, anterior R 4-7 rib fractures CT ABD/Pelvis 6/20 >> small bowel surgical changes, mild circumferential wall thickening of scattered small bowel loops, small to moderate ascites LUE Duplex 6/24>>DVT L IJ, subclavian, axillary, SVT L cephalic  CT ABD/Pelvis 0/63>>KZSWF volume ascites with intraperitoneal free air   Micro Data:  COVID 6/07 >> negative Tracheal aspirate 6/20 >> E Coli sensitive to zosyn Blood Cx 6/23 >>  neg BC x 2  6/26  >>     Antimicrobials:  Zosyn 6/07 >> 6/17 Diflucan 6/07 >> 6/10 Cefepime 6/21 >>6/26 Vanco 6/21 >>6/25 Anidulafungin 6/26>> Zosyn 6/26>>   Interim history/subjective:  Back on pressors with hgb <7 / sedated on fent drip   Objective   Blood pressure (!) 113/37, pulse 78, temperature 99.1 F (37.3 C), temperature source Bladder, resp. rate 16, height 5\' 4"  (1.626 m), weight 72.2 kg, SpO2 100 %.  CVP:  [7 mmHg-9 mmHg] 7 mmHg  Vent Mode: PRVC FiO2 (%):  [30 %] 30 % Set Rate:  [18 bmp] 18 bmp Vt Set:  [430 mL] 430 mL PEEP:  [5 cmH20] 5 cmH20 Plateau Pressure:  [13 cmH20-19 cmH20] 19 cmH20   Intake/Output Summary (Last 24 hours) at 12/12/2018 0920 Last data filed at 12/12/2018 0900 Gross per 24 hour  Intake 3341.4 ml  Output 3666 ml  Net -324.6 ml   Filed  Weights   12/09/18 0500 12/09/2018 0500 12/11/18 0400  Weight: 77.9 kg 72.9 kg 72.2 kg    Examination: Appears comfortable to prvc breathing slt over vent rate and not air trapping No jvd Oropharynx  Et in place Neck supple Lungs with a few scattered exp > insp rhonchi bilaterally RRR no s3 or or sign murmur Abd mod distened/ wound vac in place drainging about 10 cc/ h/ serosanguinous   Extr warm with no edema or clubbing noted - PAS in place Neuro  RASS  = 0 to -2   Resolved Hospital Problem list   Metabolic alkalosis Abdominal peritonitis with septic shock Circulatory shock, mix of cardiogenic and sepsis off pressors as of 6/15 Cardiorespiratory arrest 6/13 Shock liver Acute metabolic encephalopathy Hypophosphatemia  Assessment & Plan:   Acute Metabolic Encephalopathy -Previous head CT negative, similar unexplained events during this admission  -hard to eval on fent drip  -Suspect multifactorial in setting of critical illness, infection and protracted course  P: -continue neuro checks -encourage sleep/wake cycle -delirium precautions -limit sedation as tol  Fever: -suspect 2/2 DVT and peritonitis and less likely VAP -Has been Tx for E coli in sputum -Blood Cx remain NGTD however pt had anastomosis broken down with holes in same s/p re-ex lap with resection P:   -broadened to pip-tazo and add anti-fungal as per abx flow sheet above  -Porta cath continued for TPN infusing -IR consult for perm HD cath  For 6/29  -Continue foley for I/O and  Nephrology request to keep  to monitor for renal recovery.    Small Bowel Perforation s/p Resection, Lysis of Adhesions -reexploration 6/14, creation of functional end to end small bowel anastomosis and closure of abdominal wound -reexploration 6/26 with pervious anastomosis broken down with holes in same s/p resection of anastomosis with placement of wound vac P: -continue TPN -remaining plan per surgery= re-explore 6/29   Anemia -suspect anemia of critical illness, acute blood loss and nutritional    Lab Results  Component Value Date   HGB 6.6 (LL) 12/12/2018   HGB 7.1 (L) 12/11/2018   HGB 7.5 (L) 11/16/2018   HGB 12.6 06/02/2018   HGB 11.9 06/01/2017   HGB 12.2 11/27/2016   HGB 12.5 05/29/2016    P: since back on pressors rec 2 units prbc's am 6/28      Acute Hypoxic Respiratory Failure  -this is patient's 3rd intubation. Unfortunately she has not been in a position to attempt SBT and vent liberation. She is unlikely able to be weaned from vent at this point and would require trach/long term mechanical ventilation and LTACH P: -Continue full vent support - depending on surgical prognosis consider trach in OR 6/28    Atelectasis / Pleural Effusion -likely related to complicated abdominal process  P: -bronchial hygiene, bronchodilator protocol   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%, Takotsubo Cardiomyopathy P: -continue to hold home meds for now: Lisinopril and pravachol -PRN hydralazine  -Will need follow up ECHO -volume removal per neph  as tol   Acute kidney injury from tubular necrosis -baseline creatinine of 0.73 from 12/04/2018 P: -currently tol CVVH per renal -IR consult for perm HD cath ? 6/29     Type 2 Diabetes -GLU well controlled 97-116 past 24 hrs P: -continue SSI- Q6H   Thrombocytopenia secondary to sepsis, medications and consumptive  Lab Results  Component Value Date   PLT 128 (L) 12/12/2018   PLT 121 (L) 12/11/2018   PLT 108 (L) 12/07/2018   PLT 277 06/02/2018   PLT 247 06/01/2017   PLT 251 11/27/2016   PLT 246 05/29/2016    P: -Follow CBC   Moderate Protein Calorie Malnutrition  P: -continue TPN per pharmacy  -TPN labs per protocol   Acute DVT- Left IJ, subclavian, axillary  P:  - continue IV heparin unless acute bleeding obvious or more precipitous drop in hgb  Difficult IV Access P: -Left chest portcath accessed -Unable to d/c  subclavian CVC as no further access options at this time d/t LIJ DVT, existing R IJ and R SCL CVC. Groins high risk for infection and cannot place PICC -consulted IR for perm HD cath placement ? To be done 6/29?   Best practice:  Diet: TPN. DVT prophylaxis: SCDs,  IV hep GI prophylaxis: Protonix Mobility: Bedrest Code Status: Full code Disposition: continue ICU. Waiting on bed at [facility]. Need clear goals of care d/w family as pt is in MS[facility] and will, at a minimum, require LTACH post hospitalization, possibly including permanent dialysis.   Labs:   CMP Latest Ref Rng & Units 12/12/2018 12/11/2018 12/11/2018  Glucose 70 - 99 mg/dL 115(H) 142(H) 124(H)  BUN 8 - 23 mg/dL 58(H) 65(H) 86(H)  Creatinine 0.44 - 1.00 mg/dL 1.36(H) 1.48(H) 1.94(H)  Sodium 135 - 145 mmol/L 135 136 136  Potassium 3.5 - 5.1 mmol/L 4.0 4.1 4.5  Chloride 98 - 111 mmol/L 102 103 102  CO2 22 - 32 mmol/L 25 25 23   Calcium 8.9 - 10.3 mg/dL 7.5(L) 7.3(L) 7.4(L)  Total Protein 6.5 - 8.1 g/dL - - -  Total Bilirubin 0.3 - 1.2 mg/dL - - -  Alkaline Phos 38 - 126 U/L - - -  AST 15 - 41 U/L - - -  ALT 0 - 44 U/L - - -   CBC Latest Ref Rng & Units 12/12/2018 12/11/2018 12/01/2018  WBC 4.0 - 10.5 K/uL 20.1(H) 17.6(H) 12.8(H)  Hemoglobin 12.0 - 15.0 g/dL 6.6(LL) 7.1(L) 7.5(L)  Hematocrit 36.0 - 46.0 % 21.1(L) 21.3(L) 23.3(L)  Platelets 150 - 400 K/uL 128(L) 121(L) 108(L)   ABG-pnding   CBG (last 3)  Recent Labs    12/11/18 1857 12/12/18 0055 12/12/18 0539  GLUCAP 113* 110* 111*     Christinia Gully, MD Pulmonary and [place] [phone] After 5:30 PM or weekends, use [facility] [phone]

## 2018-12-12 NOTE — Progress Notes (Signed)
[place] for IV heparin Indication: IJ DVT  No Known Allergies  Patient Measurements: Height: '5\' 4"'  (162.6 cm) Weight: 159 lb 2.8 oz (72.2 kg) IBW/kg (Calculated) : 54.7 Heparin Dosing Weight: 71 kg  Vital Signs: Temp: 99.1 F (37.3 C) (06/28 0700) BP: 109/45 (06/28 0710) Pulse Rate: 80 (06/28 0710)  Labs: Recent Labs    12/06/2018 1415  12/11/18 0409 12/11/18 1256 12/11/18 1600 12/11/18 2133 12/12/18 0545  HGB 7.5*  --  7.1*  --   --   --  6.6*  HCT 23.3*  --  21.3*  --   --   --  21.1*  PLT 108*  --  121*  --   --   --  128*  APTT  --   --  99*  --   --   --   --   HEPARINUNFRC  --   --  0.27* 0.35  --  0.40 0.43  CREATININE  --    < > 1.94*  --  1.48*  --  1.36*   < > = values in this interval not displayed.    Estimated Creatinine Clearance: 40.7 mL/min (A) (by C-G formula based on SCr of 1.36 mg/dL (H)).  Scheduled:  . chlorhexidine gluconate (MEDLINE KIT)  15 mL Mouth Rinse BID  . Chlorhexidine Gluconate Cloth  6 each Topical Daily  . dorzolamide-timolol  1 drop Both Eyes BID  . heparin lock flush  500 Units Intravenous Once  . insulin aspart  3-9 Units Subcutaneous Q6H  . levothyroxine  12.5 mcg Intravenous Daily  . mouth rinse  15 mL Mouth Rinse 10 times per day  . pantoprazole (PROTONIX) IV  40 mg Intravenous Q24H   Infusions:  .  prismasol BGK 4/2.5 400 mL/hr at 12/12/18 0300  .  prismasol BGK 4/2.5 200 mL/hr at 12/12/18 0651  . sodium chloride 10 mL/hr at 12/11/18 1900  . sodium chloride Stopped (12/11/18 1655)  . anidulafungin Stopped (12/11/18 1645)  . fentaNYL infusion INTRAVENOUS 75 mcg/hr (12/11/18 2027)  . heparin 1,650 Units/hr (12/12/18 0315)  . norepinephrine (LEVOPHED) Adult infusion 3 mcg/min (12/12/18 0313)  . piperacillin-tazobactam (ZOSYN)  IV Stopped (12/12/18 0708)  . prismasol BGK 4/2.5 1,500 mL/hr at 12/12/18 0526  . sodium phosphate  Dextrose 5% IVPB    . TPN ADULT (ION) 75 mL/hr at 12/11/18  1900  . TPN ADULT (ION)     PRN: sodium chloride, sodium chloride, albuterol, alteplase, fentaNYL (SUBLIMAZE) injection, heparin, hydrALAZINE, metoprolol tartrate, sodium chloride, sodium chloride flush  Assessment: 68 yoF known to pharmacy from TPN and antibiotic dosing; complicated hospital course including cardiac arrest, intubation, VAP, and CRRT, now further complicated with extensive IJ clot (only L sided catheter removed 6/9). Pharmacy to dose heparin infusion.   Baseline INR, aPTT: baseline INR/aPTT mildly elevated but did have recent acute liver injury  Prior anticoagulation: none  Significant events: 6/25: heparin held in PM for OR tomorrow 6/26: ex lap for perforated viscus repair, PRBC given during procedure; heparin resumed 8p  Today, 12/12/2018:  CBC: anemia of acute/chronic illness (and likely also d/t CRRT) requiring multiple transfusions; Hgb now < 7 and PRBC ordered; Plt low but improving, OK to continue heparin per MD  Most recent heparin level therapeutic on 1650 units/hr  No bleeding or infusion issues per nursing, no heparin or other anticoagulation running in CRRT circuit  Plan is to return to OR on Monday, will need to pause heparin drip at 0500  tomorrow AM  Goal of Therapy: Heparin level 0.3-0.7 units/ml Monitor platelets by anticoagulation protocol: Yes  Plan:  Continue heparin at 1650 units/hr; stop at 0500 tomorrow for OR  Daily CBC and heparin level  Monitor for signs of bleeding or thrombosis   Reuel Boom, PharmD, BCPS [phone] 12/12/2018, 8:41 AM

## 2018-12-12 NOTE — Progress Notes (Signed)
2 Days Post-Op   Subjective/Chief Complaint: Intubated and sedated on vent   Objective: Vital signs in last 24 hours: Temp:  [97.7 F (36.5 C)-99.5 F (37.5 C)] 99.1 F (37.3 C) (06/28 0700) Pulse Rate:  [72-87] 80 (06/28 0710) Resp:  [16-32] 20 (06/28 0710) BP: (73-180)/(30-65) 109/45 (06/28 0710) SpO2:  [99 %-100 %] 100 % (06/28 0710) FiO2 (%):  [30 %] 30 % (06/28 0710) Last BM Date: 12/11/18  Intake/Output from previous day: 06/27 0701 - 06/28 0700 In: 3337.7 [I.V.:2974.6; IV Piggyback:338.1] Out: 3873 [Urine:70; Emesis/NG output:510; Drains:195] Intake/Output this shift: Total I/O In: -  Out: 129 [Other:129]  General appearance: sedated on vent. arouses Resp: clear to auscultation bilaterally and on vent Cardio: regular rate and rhythm GI: soft, tender. vac intact  Lab Results:  Recent Labs    12/11/18 0409 12/12/18 0545  WBC 17.6* 20.1*  HGB 7.1* 6.6*  HCT 21.3* 21.1*  PLT 121* 128*   BMET Recent Labs    12/11/18 1600 12/12/18 0545  NA 136 135  K 4.1 4.0  CL 103 102  CO2 25 25  GLUCOSE 142* 115*  BUN 65* 58*  CREATININE 1.48* 1.36*  CALCIUM 7.3* 7.5*   PT/INR No results for input(s): LABPROT, INR in the last 72 hours. ABG No results for input(s): PHART, HCO3 in the last 72 hours.  Invalid input(s): PCO2, PO2  Studies/Results: Dg Chest Port 1 View  Result Date: 12/11/2018 CLINICAL DATA:  Pleural effusion EXAM: PORTABLE CHEST 1 VIEW COMPARISON:  Four days ago FINDINGS: Endotracheal tube tip just below the clavicular heads. The orogastric tube reaches the stomach. Left port with 2 right-sided lines. Tips terminate at the [facility] or upper cavoatrial junction. Bandlike opacities at the bases. No edema, effusion, or pneumothorax. Normal heart size. IMPRESSION: Stable hardware positioning and lower lobe atelectasis. Electronically Signed   By: Monte Fantasia M.D.   On: 12/11/2018 07:16    Anti-infectives: Anti-infectives (From admission, onward)   Start     Dose/Rate Route Frequency Ordered Stop   12/11/18 1500  anidulafungin (ERAXIS) 100 mg in sodium chloride 0.9 % 100 mL IVPB     100 mg 78 mL/hr over 100 Minutes Intravenous Every 24 hours 12/05/2018 1007     12/11/18 0600  piperacillin-tazobactam (ZOSYN) IVPB 3.375 g     3.375 g 100 mL/hr over 30 Minutes Intravenous Every 6 hours 12/11/18 0317     12/08/2018 1800  piperacillin-tazobactam (ZOSYN) IVPB 2.25 g  Status:  Discontinued     2.25 g 100 mL/hr over 30 Minutes Intravenous Every 6 hours 12/11/2018 1413 12/11/18 0317   11/29/2018 1100  anidulafungin (ERAXIS) 200 mg in sodium chloride 0.9 % 200 mL IVPB     200 mg 78 mL/hr over 200 Minutes Intravenous  Once 11/16/2018 1007 11/17/2018 1456   11/17/2018 1100  piperacillin-tazobactam (ZOSYN) IVPB 3.375 g     3.375 g 100 mL/hr over 30 Minutes Intravenous  Once 12/09/2018 1027 11/16/2018 1245   12/08/18 1800  vancomycin (VANCOCIN) IVPB 750 mg/150 ml premix  Status:  Discontinued     750 mg 150 mL/hr over 60 Minutes Intravenous Every 48 hours 12/07/18 0822 12/07/18 0903   12/08/18 1000  ceFEPIme (MAXIPIME) 2 g in sodium chloride 0.9 % 100 mL IVPB  Status:  Discontinued     2 g 200 mL/hr over 30 Minutes Intravenous Every 24 hours 12/07/18 0822 12/07/18 0903   12/07/18 2200  ceFEPIme (MAXIPIME) 2 g in sodium chloride 0.9 % 100 mL  IVPB  Status:  Discontinued     2 g 200 mL/hr over 30 Minutes Intravenous Every 12 hours 12/07/18 0903 12/01/2018 1027   12/07/18 1800  vancomycin (VANCOCIN) IVPB 750 mg/150 ml premix  Status:  Discontinued     750 mg 150 mL/hr over 60 Minutes Intravenous Every 24 hours 12/07/18 0903 12/09/18 1040   12/06/18 1800  vancomycin (VANCOCIN) IVPB 750 mg/150 ml premix  Status:  Discontinued     750 mg 150 mL/hr over 60 Minutes Intravenous Every 24 hours 12/05/18 1652 12/07/18 0822   12/05/18 1800  ceFEPIme (MAXIPIME) 2 g in sodium chloride 0.9 % 100 mL IVPB  Status:  Discontinued     2 g 200 mL/hr over 30 Minutes Intravenous Every  12 hours 12/05/18 1644 12/07/18 0822   12/05/18 1700  vancomycin (VANCOCIN) 1,250 mg in sodium chloride 0.9 % 250 mL IVPB     1,250 mg 166.7 mL/hr over 90 Minutes Intravenous  Once 12/05/18 1652 12/05/18 2019   12/06/2018 1800  fluconazole (DIFLUCAN) IVPB 200 mg  Status:  Discontinued     200 mg 100 mL/hr over 60 Minutes Intravenous Every 24 hours 11/26/2018 0748 11/22/2018 0847   12/08/2018 1800  fluconazole (DIFLUCAN) IVPB 400 mg  Status:  Discontinued     400 mg 100 mL/hr over 120 Minutes Intravenous Every 24 hours 11/20/2018 0847 11/24/2018 0856   11/15/2018 1400  piperacillin-tazobactam (ZOSYN) IVPB 2.25 g  Status:  Discontinued     2.25 g 100 mL/hr over 30 Minutes Intravenous Every 6 hours 11/15/2018 0748 12/01/18 1104   11/18/2018 0600  cefoTEtan (CEFOTAN) 2 g in sodium chloride 0.9 % 100 mL IVPB     2 g 200 mL/hr over 30 Minutes Intravenous On call to O.R. 11/22/18 1300 12/07/2018 0624   11/22/18 1800  fluconazole (DIFLUCAN) IVPB 400 mg  Status:  Discontinued     400 mg 100 mL/hr over 120 Minutes Intravenous Every 24 hours 12/09/2018 1528 11/30/2018 0748   11/22/18 1800  vancomycin (VANCOCIN) 1,500 mg in sodium chloride 0.9 % 500 mL IVPB  Status:  Discontinued     1,500 mg 250 mL/hr over 120 Minutes Intravenous Every 24 hours 11/22/18 0358 11/22/18 0750   11/22/18 0115  vancomycin (VANCOCIN) 1,500 mg in sodium chloride 0.9 % 500 mL IVPB     1,500 mg 250 mL/hr over 120 Minutes Intravenous  Once 11/22/18 0101 11/22/18 0319   11/16/2018 1630  fluconazole (DIFLUCAN) IVPB 800 mg     800 mg 100 mL/hr over 240 Minutes Intravenous  Once 12/08/2018 1527 11/29/2018 2013   11/22/2018 1600  piperacillin-tazobactam (ZOSYN) IVPB 3.375 g  Status:  Discontinued     3.375 g 12.5 mL/hr over 240 Minutes Intravenous Every 8 hours 12/09/2018 1522 11/30/2018 0748   12/12/2018 0545  piperacillin-tazobactam (ZOSYN) IVPB 3.375 g     3.375 g 100 mL/hr over 30 Minutes Intravenous  Once 11/16/2018 0530 11/26/2018 0626   11/22/2018 0530   piperacillin-tazobactam (ZOSYN) IVPB 4.5 g  Status:  Discontinued     4.5 g 200 mL/hr over 30 Minutes Intravenous  Once 12/05/2018 0517 11/22/2018 0529      Assessment/Plan: s/p Procedure(s): EXPLORATORY LAPAROTOMY RESECTION OF PREVIOUS ANASTAMOSIS (N/A) APPLICATION OF WOUND VAC (N/A) continue bowel rest  tpn for nutrition s/p Procedure(s): EXPLORATORY LAPAROTOMY RESECTION OF PREVIOUS ANASTAMOSIS (N/A) APPLICATION OF WOUND VAC (N/A) Continue ng and bowel rest  HTN HLD DM Hypothyroidism 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 Ninfa Linden- POD #21 2. S/pReexploration of recent laparotomyWITH [facility] OUT (N/A)6/9 Dr. Rosendo Gros- POD #19 3. S/pExploratory laparotomy with washout of abdomen and placement of vacuum VAC dressing6/11 Dr. Brantley Stage- POD #17 4. Exploratory laparotomy with anastomosis of SB and closure of abdominal wall 6/14 Dr. Brantley Stage- POD #14 -ex lap, resection anastamosis and vac. Pod 2. Will need to go back on monday -cont TNA for now -Cont vac to midline wound -heparin drip ID -diflucan 6/7-6/11, vancomycin 6/8-6/8.zosyn 6/7>>6/21, Cefepime 6/21 --> FEN -IVF, NGT,TNA  VTE -SCDs, heparingtt Foley -in place Contact - brother GeorgeMattier([phone] or [phone]) - pt is full code  LOS: 21 days    Autumn Messing III 12/12/2018

## 2018-12-13 ENCOUNTER — Encounter (HOSPITAL_COMMUNITY): Payer: Self-pay | Admitting: General Surgery

## 2018-12-13 ENCOUNTER — Inpatient Hospital Stay (HOSPITAL_COMMUNITY): Payer: BLUE CROSS/BLUE SHIELD

## 2018-12-13 ENCOUNTER — Encounter (HOSPITAL_COMMUNITY): Admission: EM | Disposition: E | Payer: Self-pay | Source: Home / Self Care

## 2018-12-13 ENCOUNTER — Inpatient Hospital Stay (HOSPITAL_COMMUNITY): Payer: BLUE CROSS/BLUE SHIELD | Admitting: Anesthesiology

## 2018-12-13 HISTORY — PX: LAPAROTOMY: SHX154

## 2018-12-13 LAB — RENAL FUNCTION PANEL
Albumin: 1.5 g/dL — ABNORMAL LOW (ref 3.5–5.0)
Albumin: 1.3 g/dL — ABNORMAL LOW (ref 3.5–5.0)
Anion gap: 8 (ref 5–15)
Anion gap: 7 (ref 5–15)
BUN: 55 mg/dL — ABNORMAL HIGH (ref 8–23)
BUN: 47 mg/dL — ABNORMAL HIGH (ref 8–23)
CO2: 25 mmol/L (ref 22–32)
CO2: 24 mmol/L (ref 22–32)
Calcium: 7.6 mg/dL — ABNORMAL LOW (ref 8.9–10.3)
Calcium: 6.7 mg/dL — ABNORMAL LOW (ref 8.9–10.3)
Chloride: 103 mmol/L (ref 98–111)
Chloride: 103 mmol/L (ref 98–111)
Creatinine, Ser: 1.51 mg/dL — ABNORMAL HIGH (ref 0.44–1.00)
Creatinine, Ser: 1.22 mg/dL — ABNORMAL HIGH (ref 0.44–1.00)
GFR calc Af Amer: 42 mL/min — ABNORMAL LOW (ref >60)
GFR calc Af Amer: 54 mL/min — ABNORMAL LOW (ref >60)
GFR calc non Af Amer: 36 mL/min — ABNORMAL LOW (ref >60)
GFR calc non Af Amer: 47 mL/min — ABNORMAL LOW (ref >60)
Glucose, Bld: 119 mg/dL — ABNORMAL HIGH (ref 70–99)
Glucose, Bld: 142 mg/dL — ABNORMAL HIGH (ref 70–99)
Phosphorus: 3.5 mg/dL (ref 2.5–4.6)
Phosphorus: 3.2 mg/dL (ref 2.5–4.6)
Potassium: 4.2 mmol/L (ref 3.5–5.1)
Potassium: 4 mmol/L (ref 3.5–5.1)
Sodium: 136 mmol/L (ref 135–145)
Sodium: 134 mmol/L — ABNORMAL LOW (ref 135–145)

## 2018-12-13 LAB — TYPE AND SCREEN
ABO/RH(D): O POS
Antibody Screen: NEGATIVE
Unit division: 0
Unit division: 0
Unit division: 0

## 2018-12-13 LAB — COMPREHENSIVE METABOLIC PANEL
ALT: 27 U/L (ref 0–44)
AST: 30 U/L (ref 15–41)
Albumin: 1.4 g/dL — ABNORMAL LOW (ref 3.5–5.0)
Alkaline Phosphatase: 217 U/L — ABNORMAL HIGH (ref 38–126)
Anion gap: 8 (ref 5–15)
BUN: 45 mg/dL — ABNORMAL HIGH (ref 8–23)
CO2: 25 mmol/L (ref 22–32)
Calcium: 7.3 mg/dL — ABNORMAL LOW (ref 8.9–10.3)
Chloride: 104 mmol/L (ref 98–111)
Creatinine, Ser: 1.14 mg/dL — ABNORMAL HIGH (ref 0.44–1.00)
GFR calc Af Amer: 59 mL/min — ABNORMAL LOW (ref >60)
GFR calc non Af Amer: 51 mL/min — ABNORMAL LOW (ref >60)
Glucose, Bld: 92 mg/dL (ref 70–99)
Potassium: 3.7 mmol/L (ref 3.5–5.1)
Sodium: 137 mmol/L (ref 135–145)
Total Bilirubin: 1 mg/dL (ref 0.3–1.2)
Total Protein: 5.7 g/dL — ABNORMAL LOW (ref 6.5–8.1)

## 2018-12-13 LAB — GLUCOSE, CAPILLARY
Glucose-Capillary: 96 mg/dL (ref 70–99)
Glucose-Capillary: 118 mg/dL — ABNORMAL HIGH (ref 70–99)
Glucose-Capillary: 136 mg/dL — ABNORMAL HIGH (ref 70–99)

## 2018-12-13 LAB — DIFFERENTIAL
Abs Immature Granulocytes: 3.2 10*3/uL — ABNORMAL HIGH (ref 0.00–0.07)
Band Neutrophils: 9 %
Basophils Absolute: 0 10*3/uL (ref 0.0–0.1)
Basophils Relative: 0 %
Eosinophils Absolute: 0 10*3/uL (ref 0.0–0.5)
Eosinophils Relative: 0 %
Lymphocytes Relative: 8 %
Lymphs Abs: 1.8 10*3/uL (ref 0.7–4.0)
Metamyelocytes Relative: 9 %
Monocytes Absolute: 0.5 10*3/uL (ref 0.1–1.0)
Monocytes Relative: 2 %
Myelocytes: 5 %
Neutro Abs: 17.3 10*3/uL — ABNORMAL HIGH (ref 1.7–7.7)
Neutrophils Relative %: 67 %

## 2018-12-13 LAB — CBC
HCT: 28.5 % — ABNORMAL LOW (ref 36.0–46.0)
Hemoglobin: 9.2 g/dL — ABNORMAL LOW (ref 12.0–15.0)
MCH: 30 pg (ref 26.0–34.0)
MCHC: 32.3 g/dL (ref 30.0–36.0)
MCV: 92.8 fL (ref 80.0–100.0)
Platelets: 120 10*3/uL — ABNORMAL LOW (ref 150–400)
RBC: 3.07 MIL/uL — ABNORMAL LOW (ref 3.87–5.11)
RDW: 16.7 % — ABNORMAL HIGH (ref 11.5–15.5)
WBC: 22.8 10*3/uL — ABNORMAL HIGH (ref 4.0–10.5)
nRBC: 0.3 % — ABNORMAL HIGH (ref 0.0–0.2)

## 2018-12-13 LAB — BPAM RBC
Blood Product Expiration Date: 202007212359
Blood Product Expiration Date: 202007242359
Blood Product Expiration Date: 202007242359
ISSUE DATE / TIME: 202006260659
ISSUE DATE / TIME: 202006281105
ISSUE DATE / TIME: 202006281436
Unit Type and Rh: 5100
Unit Type and Rh: 5100
Unit Type and Rh: 5100

## 2018-12-13 LAB — PREALBUMIN: Prealbumin: 6.8 mg/dL — ABNORMAL LOW (ref 18–38)

## 2018-12-13 LAB — MAGNESIUM: Magnesium: 2.3 mg/dL (ref 1.7–2.4)

## 2018-12-13 LAB — HEPARIN LEVEL (UNFRACTIONATED): Heparin Unfractionated: <0.1 IU/mL — ABNORMAL LOW (ref 0.30–0.70)

## 2018-12-13 LAB — TRIGLYCERIDES: Triglycerides: 108 mg/dL (ref <150)

## 2018-12-13 SURGERY — LAPAROTOMY, EXPLORATORY
Anesthesia: General | Site: Abdomen

## 2018-12-13 MED ORDER — PHENYLEPHRINE 40 MCG/ML (10ML) SYRINGE FOR IV PUSH (FOR BLOOD PRESSURE SUPPORT)
PREFILLED_SYRINGE | INTRAVENOUS | Status: DC | PRN
  Administered 2018-12-13 (×2): 120 ug via INTRAVENOUS
  Administered 2018-12-13: 80 ug via INTRAVENOUS

## 2018-12-13 MED ORDER — 0.9 % SODIUM CHLORIDE (POUR BTL) OPTIME
TOPICAL | Status: DC | PRN
  Administered 2018-12-13: 8000 mL

## 2018-12-13 MED ORDER — ROCURONIUM BROMIDE 10 MG/ML (PF) SYRINGE
PREFILLED_SYRINGE | INTRAVENOUS | Status: AC
  Filled 2018-12-13: qty 20

## 2018-12-13 MED ORDER — SODIUM CHLORIDE 0.9 % IV SOLN
INTRAVENOUS | Status: DC | PRN
  Administered 2018-12-13: 10:00:00 via INTRAVENOUS

## 2018-12-13 MED ORDER — ONDANSETRON HCL 4 MG/2ML IJ SOLN
INTRAMUSCULAR | Status: DC | PRN
  Administered 2018-12-13: 4 mg via INTRAVENOUS

## 2018-12-13 MED ORDER — HEPARIN (PORCINE) 25000 UT/250ML-% IV SOLN
1650.0000 [IU]/h | INTRAVENOUS | Status: DC
  Administered 2018-12-13 – 2018-12-16 (×5): 1650 [IU]/h via INTRAVENOUS
  Filled 2018-12-13 (×5): qty 250

## 2018-12-13 MED ORDER — ONDANSETRON HCL 4 MG/2ML IJ SOLN
INTRAMUSCULAR | Status: AC
  Filled 2018-12-13: qty 2

## 2018-12-13 MED ORDER — PHENYLEPHRINE 40 MCG/ML (10ML) SYRINGE FOR IV PUSH (FOR BLOOD PRESSURE SUPPORT)
PREFILLED_SYRINGE | INTRAVENOUS | Status: AC
  Filled 2018-12-13: qty 10

## 2018-12-13 MED ORDER — ROCURONIUM BROMIDE 10 MG/ML (PF) SYRINGE
PREFILLED_SYRINGE | INTRAVENOUS | Status: DC | PRN
  Administered 2018-12-13: 10 mg via INTRAVENOUS
  Administered 2018-12-13: 40 mg via INTRAVENOUS
  Administered 2018-12-13 (×2): 10 mg via INTRAVENOUS
  Administered 2018-12-13: 20 mg via INTRAVENOUS
  Administered 2018-12-13: 50 mg via INTRAVENOUS
  Administered 2018-12-13 (×2): 10 mg via INTRAVENOUS

## 2018-12-13 MED ORDER — LACTATED RINGERS IV SOLN
INTRAVENOUS | Status: DC | PRN
  Administered 2018-12-13: 10:00:00 via INTRAVENOUS

## 2018-12-13 MED ORDER — NOREPINEPHRINE BITARTRATE 1 MG/ML IV SOLN
INTRAVENOUS | Status: DC | PRN
  Administered 2018-12-13: 2 ug/min via INTRAVENOUS

## 2018-12-13 MED ORDER — FENTANYL CITRATE (PF) 100 MCG/2ML IJ SOLN
INTRAMUSCULAR | Status: DC | PRN
  Administered 2018-12-13 (×2): 25 ug via INTRAVENOUS

## 2018-12-13 MED ORDER — FENTANYL CITRATE (PF) 100 MCG/2ML IJ SOLN
INTRAMUSCULAR | Status: AC
  Filled 2018-12-13: qty 2

## 2018-12-13 MED ORDER — PROPOFOL 10 MG/ML IV BOLUS
INTRAVENOUS | Status: AC
  Filled 2018-12-13: qty 20

## 2018-12-13 MED ORDER — SODIUM CHLORIDE 0.9 % IV SOLN
INTRAVENOUS | Status: AC
  Administered 2018-12-13: 1000 mL
  Filled 2018-12-13: qty 6

## 2018-12-13 MED ORDER — TRACE MINERALS CR-CU-MN-SE-ZN 10-1000-500-60 MCG/ML IV SOLN
INTRAVENOUS | Status: AC
  Administered 2018-12-13: 17:00:00 via INTRAVENOUS
  Filled 2018-12-13: qty 1140

## 2018-12-13 SURGICAL SUPPLY — 42 items
BLADE EXTENDED COATED 6.5IN (ELECTRODE) IMPLANT
BLADE HEX COATED 2.75 (ELECTRODE) ×3 IMPLANT
BLADE SURG SZ10 CARB STEEL (BLADE) IMPLANT
COVER MAYO STAND STRL (DRAPES) ×3 IMPLANT
COVER SURGICAL LIGHT HANDLE (MISCELLANEOUS) ×3 IMPLANT
COVER WAND RF STERILE (DRAPES) IMPLANT
DRAIN CHANNEL 19F RND (DRAIN) IMPLANT
DRAPE LAPAROSCOPIC ABDOMINAL (DRAPES) ×3 IMPLANT
DRAPE SHEET LG 3/4 BI-LAMINATE (DRAPES) IMPLANT
DRAPE WARM FLUID 44X44 (DRAPES) ×3 IMPLANT
DRSG PAD ABDOMINAL 8X10 ST (GAUZE/BANDAGES/DRESSINGS) IMPLANT
ELECT REM PT RETURN 15FT ADLT (MISCELLANEOUS) ×3 IMPLANT
EVACUATOR DRAINAGE 10X20 100CC (DRAIN) IMPLANT
EVACUATOR SILICONE 100CC (DRAIN)
GAUZE SPONGE 4X4 12PLY STRL (GAUZE/BANDAGES/DRESSINGS) ×1 IMPLANT
GLOVE BIO SURGEON STRL SZ 6 (GLOVE) ×5 IMPLANT
GLOVE BIOGEL PI IND STRL 6.5 (GLOVE) ×1 IMPLANT
GLOVE BIOGEL PI INDICATOR 6.5 (GLOVE) ×4
GLOVE ECLIPSE 6.5 STRL STRAW (GLOVE) ×2 IMPLANT
GLOVE INDICATOR 6.5 STRL GRN (GLOVE) ×4 IMPLANT
GLOVE SURG SS PI 7.0 STRL IVOR (GLOVE) ×2 IMPLANT
GOWN STRL REUS W/ TWL XL LVL3 (GOWN DISPOSABLE) ×3 IMPLANT
GOWN STRL REUS W/TWL 2XL LVL3 (GOWN DISPOSABLE) ×3 IMPLANT
GOWN STRL REUS W/TWL XL LVL3 (GOWN DISPOSABLE) ×6
HANDLE SUCTION POOLE (INSTRUMENTS) ×1 IMPLANT
KIT BASIN OR (CUSTOM PROCEDURE TRAY) ×3 IMPLANT
KIT TURNOVER KIT A (KITS) ×2 IMPLANT
LIGASURE IMPACT 36 18CM CVD LR (INSTRUMENTS) ×2 IMPLANT
PACK GENERAL/GYN (CUSTOM PROCEDURE TRAY) ×3 IMPLANT
RELOAD PROXIMATE 75MM BLUE (ENDOMECHANICALS) ×6 IMPLANT
RELOAD STAPLE 75 3.8 BLU REG (ENDOMECHANICALS) IMPLANT
SPONGE ABDOMINAL VAC ABTHERA (MISCELLANEOUS) ×2 IMPLANT
STAPLER GUN LINEAR PROX 60 (STAPLE) ×2 IMPLANT
STAPLER PROXIMATE 75MM BLUE (STAPLE) ×2 IMPLANT
STAPLER VISISTAT 35W (STAPLE) ×3 IMPLANT
SUCTION POOLE HANDLE (INSTRUMENTS) ×3
SUT PDS AB 1 CTX 36 (SUTURE) IMPLANT
SUT PROLENE 2 0 SH DA (SUTURE) ×2 IMPLANT
SUT VIC AB 2-0 SH 18 (SUTURE) ×3 IMPLANT
SUT VIC AB 3-0 SH 18 (SUTURE) ×3 IMPLANT
TOWEL OR 17X26 10 PK STRL BLUE (TOWEL DISPOSABLE) ×6 IMPLANT
YANKAUER SUCT BULB TIP NO VENT (SUCTIONS) ×3 IMPLANT

## 2018-12-13 NOTE — Progress Notes (Addendum)
PHARMACY - ADULT TOTAL PARENTERAL NUTRITION CONSULT NOTE   Pharmacy Consult for TPN Indication: prolonged ileus  HPI: 52 yoF admitted 6/7 with perforated small bowel from closed-loop obstruction and underwent emergent exploratory laparotomy. Returned to OR on 6/9 and again on 6/14 for closure of abdominal wall.  Patient coded on 12-25-22 and developed multiorgan failure. Remains intubated & on CRRT. Pressors have been weaned off and she is completing antibiotic course for sepsis/PNA.  NPO since admission on 6/7. Pharmacy consulted to start TPN 6/16. D/t acute liver injury from shock, initiated TPN at low rate & advance slowly as requested by surgery.  Patient Measurements: Height: '5\' 4"'  (162.6 cm) Weight: 159 lb 2.8 oz (72.2 kg) IBW/kg (Calculated) : 54.7 TPN AdjBW (KG): 59.7 Body mass index is 27.32 kg/m. Usual Weight: 90kg   Intake/Output Summary (Last 24 hours) at 11/15/2018 0811 Last data filed at 11/26/2018 0700 Gross per 24 hour  Intake 3881.79 ml  Output 3907 ml  Net -25.21 ml    Recent Labs    12/12/18 0545 12/12/18 1800 11/26/2018 0500  NA 135 136 137  K 4.0 3.9 3.7  CL 102 99 104  CO2 '25 27 25  ' GLUCOSE 115* 153* 92  BUN 58* 48* 45*  CREATININE 1.36* 1.29* 1.14*  CALCIUM 7.5* 7.5* 7.3*  PHOS 2.0* 4.6  --   MG 2.2  --  2.3  ALBUMIN 1.3* 1.5* 1.4*  ALKPHOS  --   --  217*  AST  --   --  30  ALT  --   --  27  BILITOT  --   --  1.0    Significant events:  6/18: OK to increase to goal rate per surgery 6/22: CRRT stopped for holiday; CBG 69 overnight, D50 given but TPN continued (55 units insulin in TPN); BS and BM overnight - initiating trickle feeds 6/23: resuming CRRT, return to high-protein TPN; fevers overnight, CCM removing some central lines; will use chemo port for TPN 6/24: began adding lipids to TPN formula 6/25: trickle feeds stopped d/t abd distention 6/26: CRRT clotted off overnight; patient to OR this AM for ex lap; RN attempting to restart CRRT  post-op but still having issues at this time 6/27: CRRT appears to have been successfully resumed overnight   Central access: 12/14/2018 TPN start date: 11/30/18  ASSESSMENT                                                                                                          Current Nutrition: NPO IVF: On CRRT to keep net neutral I/O  Today:   Glucose (goal 100-150) - Hx DM on 70/30 Novolog 20 units BID PTA.  CBGs well controlled (range 110-113)  25 units insulin in TPN + ICU resistant scale; 3 units SSI required yesterday  Electrolytes - on CRRT; K 3.7, CoCa 9.38, mag 2.3, phos 2>4.6 after 75m Na phos yesterday  Renal - AKI; SCr, bicarb normalized on CRRT, BUN remains elevated  LFTs - albumin remains low; alk phos 217  TGs - WNL (6/22)  Prealbumin - remains low but improving (6/22), not done 6/29  NUTRITIONAL GOALS                                                                                             RD recs: (6/23 - back on CRRT):  Kcal:1530 kcal, Protein:172-187 grams (2.3-2.5 grams/kg), Fluid:>/= 1.8 L/day  PLAN                                                                                                                         At 1800 today:  Continue TPN at 75 ml/hr  Custom TPN at goal rate of 75 ml/hr  to provide: 171 g/day protein, 1530 Kcal/day.  Electrolytes: continue to hold Mag, small K, increased Na, decr Phos; Cl:Ac = 1:2  TPN to contain standard multivitamins, trace elements MWF only due to national backorder.  IVF per MD  Continue ICU resistant-scale glycemic control orders q6h; continue 25 units regular insulin in TPN while TFs held  TPN lab panels Mondays & Thursdays  Ordered BID renal function panels while on CRRT  Eudelia Bunch, Pharm.D 12/05/2018 8:26 AM

## 2018-12-13 NOTE — Interval H&P Note (Deleted)
History and Physical Interval Note:  11/20/2018 9:00 AM  Carmen Stone  has presented today for surgery, with the diagnosis of small bowel leak.  The various methods of treatment have been discussed with the patient and family. I spoke with patient and mother.  There was concern that the patient was still a bit confused after self extubation, but he provided assent and his mother provided consent.  After consideration of risks, benefits and other options for treatment, the patient has consented to  Procedure(s): OPEN ABDOMEN TAKE BACK (N/A) as a surgical intervention.  The patient's history has been reviewed, patient examined, no change in status, stable for surgery.  I have reviewed the patient's chart and labs.  Questions were answered to the patient's satisfaction.     Stark Klein

## 2018-12-13 NOTE — Progress Notes (Signed)
Pharmacy Antibiotic Note  ANALLELI Stone is a 63 y.o. female admitted on 12/14/2018 with perforated loop of SB. Her hospital stay has been complicated by cardiac arrest requiring intubation & pressors as well as CRRT for AKI.  She completed 10d course of Zosyn on 6/17.   She had low grade fever of 100.43F & bradycardia requiring pressors to be resumed today.  WBC has also been trending up off Zosyn.  Chest CT 6/20 could not rule out pneumonia.  Pharmacy was consulted for Vancomycin & Cefepime dosing.  6/23: new fevers, BCx ordered, removed 1 of 2 central lines, changed Foley, using Port for TPN 6/26 Fevers this AM and taken back to OR this AM revealing perforated bowel, cefepime changed to Zosyn + Eraxis 11/17/2018 WBC rising, up to 22.8 today. AF, on CRRT, intubated and sedated, to OR today to re-explore  Plan:  Zosyn 3.375 g IV q6 hrs each dose over 30 mins while on CRRT  Eraxis per MD, dosing appropriate  Height: 5\' 4"  (162.6 cm) Weight: 159 lb 2.8 oz (72.2 kg) IBW/kg (Calculated) : 54.7  Temp (24hrs), Avg:98.9 F (37.2 C), Min:98.4 F (36.9 C), Max:99.7 F (37.6 C)  Recent Labs  Lab 12/09/18 1856 11/18/2018 0352 11/26/2018 1222 12/01/2018 1415  12/11/18 0409 12/11/18 1600 12/12/18 0545 12/12/18 1800 12/06/2018 0500  WBC  --  15.2*  --  12.8*  --  17.6*  --  20.1*  --  22.8*  CREATININE  --  1.60*  --   --    < > 1.94* 1.48* 1.36* 1.29* 1.14*  LATICACIDVEN 2.0*  --  1.6  --   --   --   --   --   --   --    < > = values in this interval not displayed.    Estimated Creatinine Clearance: 48.6 mL/min (A) (by C-G formula based on SCr of 1.14 mg/dL (H)).    No Known Allergies  Antimicrobials this admission: 6/7 zosyn > 6/17 6/7 fluconazole > 6/11 6/8 vancomycin >> 6/8, resumed 6/21>> 6/24 6/21 cefepime>> 6/26 6/26 Zosyn >> 6/26 Eraxis >>  Dose adjustments this admission:  Microbiology results: 6/7 Covid 19 negative 6/8 MRSA PCR: negative 6/21 Tracheal aspirate: rare  E.Coli; R to ampicillin and Unasyn 6/23 BCx (x1): ngF 6/26 BCx2: ngtd  Thank you for allowing pharmacy to be a part of this patient's care.  Carmen Stone, Pharm.D [phone] 11/16/2018 8:03 AM

## 2018-12-13 NOTE — Progress Notes (Signed)
[facility] ROUNDING NOTE   Subjective:   This is a 63 year old lady with acute kidney injury last baseline 0.79 06/04/2018.  She had acute oliguric acute tubular necrosis in the setting of septic shock from bowel perforation.  Was on CRRT from 6/ 9  to  6/12 was held after cardiac arrest.  She resumed 12/14/2018 after abdominal closure.  Still no signs of renal recovery.  Status post small bowel perforation due to closed-loop SBO and septic shock followed by CCM underwent closure 11/29/2018 new recent bowel leak due to nonhealing anastomosis.  Went back to the OR 11/19/2018 for resection of anastomosis.  She went back to the OR 11/22/2018.  She has a large wound VAC.  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  No history of diabetes mellitus type 2.  No recorded urine output.  Has been kept even on CRRT  Blood pressure 140/47 pulse 91 temperature 97 O2 sats 98% FiO2 30%  Sodium 137 potassium 3.7 chloride 104 CO2 25 BUN 45 creatinine 1.1 glucose 92 calcium 7.3 phosphorus 3.5 magnesium 2.3 albumin 1.5 AST 30 ALT 27 WBC 22.8 hemoglobin 9.24 platelets 120  CT scan abdomen 12/09/2018 shows large amount of intraperitoneal free fluid which is progressed to large volumes ascites.  Prominent intraperitoneal free air collection under the anterior abdominal wall with gas scattered in the central small bowel mesentery.  Patchy airspace disease.  Levothyroxine 12.5 mcg IV daily Protonix 40 mg IV every 24 hours   Objective:  Vital signs in last 24 hours:  Temp:  [98.2 F (36.8 C)-99.7 F (37.6 C)] 98.2 F (36.8 C) (06/29 1200) Pulse Rate:  [66-101] 101 (06/29 1200) Resp:  [16-23] 18 (06/29 1200) BP: (94-157)/(33-61) 131/51 (06/29 1200) SpO2:  [94 %-100 %] 98 % (06/29 1200) FiO2 (%):  [30 %] 30 % (06/29 1115)  Weight change:  Filed Weights   12/09/18 0500 11/15/2018 0500 12/11/18 0400  Weight: 77.9 kg 72.9 kg 72.2 kg    Intake/Output: I/O last 3  completed shifts: In: 6295 [I.V.:3997; Blood:903; IV Piggyback:650.1] Out: 2841 [Urine:41; Emesis/NG output:665; Drains:270; LKGMW:1027]   Intake/Output this shift:  Total I/O In: 547.6 [I.V.:547.6] Out: 199 [Other:99; Blood:100] Intubated and sedated making grimacing faces. CVS- RRR no JVP RS- CTA intubated with rhonchorous breath sounds ABD- BS present soft non-distended EXT-1-2+ lower extremity edema   Basic Metabolic Panel: Recent Labs  Lab 12/09/18 0455  11/18/2018 0352  12/11/18 0409 12/11/18 1600 12/12/18 0545 12/12/18 1800 11/22/2018 0500  NA 135   < > 137   < > 136 136 135 136 136  137  K 5.1   < > 4.8   < > 4.5 4.1 4.0 3.9 4.2  3.7  CL 99   < > 100   < > 102 103 102 99 103  104  CO2 21*   < > 24   < > '23 25 25 27 25  25  ' GLUCOSE 147*   < > 116*   < > 124* 142* 115* 153* 119*  92  BUN 69*   < > 74*   < > 86* 65* 58* 48* 55*  45*  CREATININE 1.49*   < > 1.60*   < > 1.94* 1.48* 1.36* 1.29* 1.51*  1.14*  CALCIUM 8.2*   < > 7.9*   < > 7.4* 7.3* 7.5* 7.5* 7.6*  7.3*  MG 2.4  --  2.5*  --  2.2  --  2.2  --  2.3  PHOS 3.4   < > 3.7   < > 3.6 2.5 2.0* 4.6 3.5   < > = values in this interval not displayed.    Liver Function Tests: Recent Labs  Lab 12/09/18 0455  12/11/18 0409 12/11/18 1600 12/12/18 0545 12/12/18 1800 11/29/2018 0500  AST 28  --   --   --   --   --  30  ALT 39  --   --   --   --   --  27  ALKPHOS 173*  --   --   --   --   --  217*  BILITOT 0.9  --   --   --   --   --  1.0  PROT 7.0  --   --   --   --   --  5.7*  ALBUMIN 1.7*   < > 1.3* 1.3* 1.3* 1.5* 1.5*  1.4*   < > = values in this interval not displayed.   No results for input(s): LIPASE, AMYLASE in the last 168 hours. No results for input(s): AMMONIA in the last 168 hours.  CBC: Recent Labs  Lab 12/02/2018 0352 11/19/2018 1415 12/11/18 0409 12/12/18 0545 12/12/18 1832 11/30/2018 0500  WBC 15.2* 12.8* 17.6* 20.1*  --  22.8*  NEUTROABS  --   --   --   --   --  17.3*  HGB 7.5* 7.5*  7.1* 6.6* 9.7* 9.2*  HCT 23.0* 23.3* 21.3* 21.1* 30.0* 28.5*  MCV 94.7 95.1 93.4 94.6  --  92.8  PLT 122* 108* 121* 128*  --  120*    Cardiac Enzymes: No results for input(s): CKTOTAL, CKMB, CKMBINDEX, TROPONINI in the last 168 hours.  BNP: Invalid input(s): POCBNP  CBG: Recent Labs  Lab 12/12/18 1201 12/12/18 1841 12/12/18 2325 11/22/2018 0635 11/19/2018 1202  GLUCAP 111* 142* 94 96 118*    Microbiology: Results for orders placed or performed during the hospital encounter of 11/26/2018  SARS Coronavirus 2 (CEPHEID - Performed in [place] hospital lab), Hosp Order     Status: None   Collection Time: 11/26/2018  3:55 AM   Specimen: Nasopharyngeal Swab  Result Value Ref Range Status   SARS Coronavirus 2 NEGATIVE NEGATIVE Final    Comment: (NOTE) If result is NEGATIVE SARS-CoV-2 target nucleic acids are NOT DETECTED. The SARS-CoV-2 RNA is generally detectable in upper and lower  respiratory specimens during the acute phase of infection. The lowest  concentration of SARS-CoV-2 viral copies this assay can detect is 250  copies / mL. A negative result does not preclude SARS-CoV-2 infection  and should not be used as the sole basis for treatment or other  patient management decisions.  A negative result may occur with  improper specimen collection / handling, submission of specimen other  than nasopharyngeal swab, presence of viral mutation(s) within the  areas targeted by this assay, and inadequate number of viral copies  (<250 copies / mL). A negative result must be combined with clinical  observations, patient history, and epidemiological information. If result is POSITIVE SARS-CoV-2 target nucleic acids are DETECTED. The SARS-CoV-2 RNA is generally detectable in upper and lower  respiratory specimens dur ing the acute phase of infection.  Positive  results are indicative of active infection with SARS-CoV-2.  Clinical  correlation with patient history and other diagnostic  information is  necessary to determine patient infection status.  Positive results do  not rule out bacterial infection or co-infection with other  viruses. If result is PRESUMPTIVE POSTIVE SARS-CoV-2 nucleic acids MAY BE PRESENT.   A presumptive positive result was obtained on the submitted specimen  and confirmed on repeat testing.  While 2019 novel coronavirus  (SARS-CoV-2) nucleic acids may be present in the submitted sample  additional confirmatory testing may be necessary for epidemiological  and / or clinical management purposes  to differentiate between  SARS-CoV-2 and other Sarbecovirus currently known to infect humans.  If clinically indicated additional testing with an alternate test  methodology [phone]) is advised. The SARS-CoV-2 RNA is generally  detectable in upper and lower respiratory sp ecimens during the acute  phase of infection. The expected result is Negative. Fact Sheet for Patients:  StrictlyIdeas.no Fact Sheet for Healthcare Providers: BankingDealers.co.za This test is not yet approved or cleared by the [place] FDA and has been authorized for detection and/or diagnosis of SARS-CoV-2 by FDA under an Emergency Use Authorization (EUA).  This EUA will remain in effect (meaning this test can be used) for the duration of the COVID-19 declaration under Section 564(b)(1) of the Act, 21 U.S.C. section 360bbb-3(b)(1), unless the authorization is terminated or revoked sooner. Performed at [facility], [place] [address] [place], [place] [address]   MRSA PCR Screening     Status: None   Collection Time: 11/22/18 10:14 AM   Specimen: Nasal Mucosa; Nasopharyngeal  Result Value Ref Range Status   MRSA by PCR NEGATIVE NEGATIVE Final    Comment:        The GeneXpert MRSA Assay (FDA approved for NASAL specimens only), is one component of a comprehensive MRSA colonization surveillance program. It is  not intended to diagnose MRSA infection nor to guide or monitor treatment for MRSA infections. Performed at [facility], [place] [address] [place], [place] [address]   Culture, respiratory (non-expectorated)     Status: None   Collection Time: 12/05/18 11:25 AM   Specimen: Tracheal Aspirate; Respiratory  Result Value Ref Range Status   Specimen Description   Final    TRACHEAL ASPIRATE Performed at [facility] [address] [place], [place] [address]    Special Requests   Final    NONE Performed at [facility], [place] [address] [place], [place] [address]    Gram Stain   Final    RARE WBC PRESENT, PREDOMINANTLY PMN NO ORGANISMS SEEN Performed at [facility], [place] [address] [place], [place] [address]    Culture RARE ESCHERICHIA COLI  Final   Report Status 12/07/2018 FINAL  Final   Organism ID, Bacteria ESCHERICHIA COLI  Final      Susceptibility   Escherichia coli - MIC*    AMPICILLIN >=32 RESISTANT Resistant     CEFAZOLIN <=4 SENSITIVE Sensitive     CEFEPIME <=1 SENSITIVE Sensitive     CEFTAZIDIME <=1 SENSITIVE Sensitive     CEFTRIAXONE <=1 SENSITIVE Sensitive     CIPROFLOXACIN <=0.25 SENSITIVE Sensitive     GENTAMICIN <=1 SENSITIVE Sensitive     IMIPENEM <=0.25 SENSITIVE Sensitive     TRIMETH/SULFA <=20 SENSITIVE Sensitive     AMPICILLIN/SULBACTAM >=32 RESISTANT Resistant     PIP/TAZO <=4 SENSITIVE Sensitive     Extended ESBL NEGATIVE Sensitive     * RARE ESCHERICHIA COLI  Culture, blood (routine x 2)     Status: None   Collection Time: 12/07/18 10:50 AM   Specimen: BLOOD LEFT ARM  Result Value Ref Range Status   Specimen Description  Final    BLOOD LEFT ARM Performed at [facility], [place] [address] [place], [place] [address]    Special Requests   Final    BOTTLES DRAWN AEROBIC AND ANAEROBIC Blood Culture adequate volume Performed at [facility] [address],  [place], [place] [address]    Culture   Final    NO GROWTH 5 DAYS Performed at [facility], [place] [address], [place], [place] [address]    Report Status 12/12/2018 FINAL  Final  Culture, blood (routine x 2)     Status: None (Preliminary result)   Collection Time: 12/09/2018 12:24 PM   Specimen: BLOOD  Result Value Ref Range Status   Specimen Description   Final    BLOOD RIGHT ARM Performed at [facility] [address], [place], [place] [address]    Special Requests   Final    BOTTLES DRAWN AEROBIC AND ANAEROBIC Blood Culture adequate volume Performed at [facility] [address], [place], [place] [address]    Culture   Final    NO GROWTH 3 DAYS Performed at [facility], [place] [address] [place], [place] [address]    Report Status PENDING  Incomplete  Culture, blood (routine x 2)     Status: None (Preliminary result)   Collection Time: 11/26/2018  1:03 PM   Specimen: BLOOD  Result Value Ref Range Status   Specimen Description   Final    BLOOD RIGHT ANTECUBITAL Performed at [facility] [address], [place], [place] [address]    Special Requests   Final    BOTTLES DRAWN AEROBIC ONLY Blood Culture adequate volume Performed at [facility] [address] [place], [place] [address]    Culture   Final    NO GROWTH 3 DAYS Performed at [facility], [place] [address] [place],  [address]    Report Status PENDING  Incomplete    Coagulation Studies: No results for input(s): LABPROT, INR in the last 72 hours.  Urinalysis: No results for input(s): COLORURINE, LABSPEC, PHURINE, GLUCOSEU, HGBUR, BILIRUBINUR, KETONESUR, PROTEINUR, UROBILINOGEN, NITRITE, LEUKOCYTESUR in the last 72 hours.  Invalid input(s): APPERANCEUR    Imaging: No results found.   Medications:   .  prismasol BGK 4/2.5 400 mL/hr at 12/12/18 1550  .  prismasol BGK 4/2.5 200 mL/hr at 11/22/2018 0837  . sodium  chloride Stopped (12/12/18 1005)  . sodium chloride Stopped (12/11/18 1655)  . anidulafungin Stopped (12/12/18 1716)  . fentaNYL infusion INTRAVENOUS 100 mcg/hr (11/24/2018 0934)  . heparin    . norepinephrine (LEVOPHED) Adult infusion Stopped (12/12/18 1418)  . piperacillin-tazobactam (ZOSYN)  IV Stopped (11/16/2018 1683)  . prismasol BGK 4/2.5 1,500 mL/hr at 12/12/18 1547  . TPN ADULT (ION) 75 mL/hr at 11/30/2018 0800  . TPN ADULT (ION)     . chlorhexidine gluconate (MEDLINE KIT)  15 mL Mouth Rinse BID  . Chlorhexidine Gluconate Cloth  6 each Topical Daily  . dorzolamide-timolol  1 drop Both Eyes BID  . insulin aspart  3-9 Units Subcutaneous Q6H  . levothyroxine  12.5 mcg Intravenous Daily  . mouth rinse  15 mL Mouth Rinse 10 times per day  . pantoprazole (PROTONIX) IV  40 mg Intravenous Q24H   sodium chloride, sodium chloride, albuterol, alteplase, fentaNYL (SUBLIMAZE) injection, heparin, hydrALAZINE, metoprolol tartrate, sodium chloride, sodium chloride flush  Assessment/ Plan:   Acute kidney injury with very little signs of  recovery.  She is anuric she is requiring CRRT.  Baseline serum creatinine 0.9.  Acute kidney injury following perforated small bowel with fecal peritonitis and wound debridement.  Hypotension/volumes continue to keep even.  She may be able to tolerate some volume removal.  Electrolytes appear to be stable at this time  Acid-base balance stable at this time  Perforated small bowel with acute kidney injury 6/9 to 11/26/2018.  New recent bowel leak due to nonhealing of anastomosis went to the OR 12/02/2018 11/24/2018 appreciate assistance from Dr. Barry Dienes  Diabetes mellitus per primary team  Nutrition continues on TNA     LOS: [place] '@TODAY' '@12' :52 PM

## 2018-12-13 NOTE — Op Note (Signed)
PRE-OPERATIVE DIAGNOSIS: open abdomen, small bowel perforation.    POST-OPERATIVE DIAGNOSIS:  Same  PROCEDURE:  Procedure(s): Reopening of recent laparotomy, small bowel resection, placement of abthera    SURGEON:  Surgeon(s): Stark Klein, MD  ASSISTANT:   Brigid Re, PA-C  ANESTHESIA:   general  DRAINS: abthera wound vac   LOCAL MEDICATIONS USED:  NONE  SPECIMEN:  Source of Specimen:  small bowel   DISPOSITION OF SPECIMEN:  PATHOLOGY  COUNTS:  YES  DICTATION: .Dragon Dictation  PLAN OF CARE: back to icu  PATIENT DISPOSITION:  ICU - intubated and critically ill.  FINDINGS:  Pus throughout abdomen, though minimal succus.  Bilious rind over intestines.  Small hole on distal segment of small bowel with small amount of bile  EBL: min  PROCEDURE:  Patient was identified in the ICU endotracheally to the operating room.  She was placed supine on the operating room table and general anesthesia was induced.  The outer portion of the wound VAC was removed.  Her abdomen was prepped and draped in sterile fashion.  A timeout was performed according to the surgical safety checklist.  When all was correct, we continued.  The inner sponge and the abdominal drape was removed.  Immediately apparent was pus all over the intestines and in the pelvis.  This was also in the right paracolic space.  There was pus between the loops of bowel.  There was a rind over all of the intestines that was bilious stained.  Decision was entertained to place the bowel back together as this problem was not going to be fixed anytime soon.  However, upon running the distal portion of the bowel to determine where to divide, a small site of perforation was located downstream.  There were also several weeks proximal of the wall that looked as though they may be small bowel diverticuli.  Because of this additional perforation, the bowel is not put back together.  The distal segment was removed to the site of  perforation.  The patient has minimal small bowel left.  The total amount past the ligament of Treitz is right out 100 cm.  The bowel was marked with a 2-0 Prolene at the distal portion.  The abdomen was irrigated copiously.  Antibiotic irrigation was also used.  The wound VAC was replaced.  DuraPrep was used on the abdominal wall to assist with adherence of the drape.  The patient was allowed to emerge from anesthesia but kept intubated.  She was taken back to the ICU.  Needle, sponge, and instrument counts were correct x2.

## 2018-12-13 NOTE — Progress Notes (Addendum)
NAME:  Carmen Stone, MRN:  [id number], DOB:  29-Oct-1955, LOS: 67 ADMISSION DATE:  11/24/2018, CONSULTATION DATE:  11/21/2018 REFERRING MD:  Dr. Ninfa Linden, Surgery, CHIEF COMPLAINT:  Abdominal pain   Brief History   63 y/o female presented to ER 11/18/2018 with nausea, vomiting, abdominal pain.  CT abd/pelvis showed focal perforated loop of SB with free air with multiple complex ventral hernias.  Underwent emergent laparotomy with SB resection, lysis of adhesions and wound vac.  Remained on vent and pressors post op.  Hospital course complicated by respiratory arrest 6/13, multiple reintubations, intermittent vasopressor needs, AKI on CVVHD.     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 Changing volume goal on CRRT to -100 cc an hour. Weaned 7 hours PSV   6/16 Encephalopathic. Hypertensive, adding labetalol.  IV TNA started 6/17 A little more awake.  Tolerating PSV but SBI exceeds 105.  Volume removal with CVVHD 6/18 Neuro status continues to improve.  Hemodynamics stable however did require brief vasoactive drips during nighttime.  She is now at an net negative fluid volume status.  Likely nearing euvolemic state.  PSV 6/19 Extubated 6/20 reintubated for altered mentation, head CT negative  6/21 Tx PRBC x1.  Off vasopressors 6/23 Febrile overnight, Blood Cx. Transfuse 1 U PRBC 6/24 Afebrile. Remains off pressors. 100cc/hr removal /c CRRT. 30% FiO2.  6/25 L IJ, subclavian, axillary  + DVT. Heparin initiated. Remains on CRRT.  6/26  CT ABD/pelvis positive for free air and  ascites. Back to OR  for ex lap of perforated viscous, resection of previous anastomosis, application of wound vac. 1U RBCs intra op.   6/28 Back on pressors with hgb <7 / sedated on fent drip, PRBC 6/29 Pending OR for possible closure   Consults:  PCCM 6/7 Renal 6/09 AKI, acidosis Cardiology 6/09 Takotsubo CM   Procedures:  ETT 6/07 >> 6/19, 6/20 >> Rt IJ HD 6/09 >> Rt Blair CVL 6/09 >> Lt radial aline 6/09 >> discontinued  Significant Diagnostic Tests:  CT abd/pelvis 6/07 >> focal perforated loop of SB with free air with multiple complex ventral hernias Echo 6/08 >> EF 20%, Takotsubo CM CT head 6/20 >> negative for any acute findings CT abdomen 6/20 >> bilateral effusions, atelectasis left lower lobe, results noted.    CT Chest 6/20 >> small bilateral pleural effusions with compressive atelectasis, anterior R 4-7 rib fractures CT ABD/Pelvis 6/20 >> small bowel surgical changes, mild circumferential wall thickening of scattered small bowel loops, small to moderate ascites LUE Duplex 6/24 >> DVT L IJ, subclavian, axillary, SVT L cephalic  CT ABD/Pelvis 2/97 >> large volume ascites with intraperitoneal free air   Micro Data:  COVID 6/07 >> negative Tracheal aspirate 6/20 >> E Coli sensitive to zosyn Blood Cx 6/23 >> neg BCx2  6/26 >>     Antimicrobials:  Zosyn 6/07 >> 6/17 Diflucan 6/07 >> 6/10 Cefepime 6/21 >>6/26 Vanco 6/21 >> 6/25 Anidulafungin 6/26 >> Zosyn 6/26 >>  Interim history/subjective:  Tmax 99.1.  I/O- net even for last 24 hours / net neg 12L for admit.  Objective   Blood pressure (!) 138/49, pulse 88, temperature 99.1 F (37.3 C), temperature source Bladder, resp. rate (!) 21, height 5\' 4"  (1.626 m), weight 72.2 kg, SpO2 97 %. CVP:  [7 mmHg] 7 mmHg  Vent Mode: PRVC FiO2 (%):  [30 %] 30 % Set Rate:  [18 bmp] 18 bmp Vt Set:  [430 mL] 430 mL PEEP:  [5 cmH20] 5 cmH20 Plateau Pressure:  [17 cmH20-30 cmH20] 30 cmH20   Intake/Output Summary (Last 24 hours) at  11/15/2018 0848 Last data filed at 12/05/2018 0800 Gross per 24 hour  Intake 3974.86 ml  Output 4006 ml  Net -31.14 ml   Filed Weights   12/09/18 0500 11/22/2018 0500 12/11/18 0400  Weight: 77.9 kg 72.9 kg 72.2 kg    Examination: General: adult female lying in bed in NAD on vent  HEENT: MM pink/moist, ETT, R IJ HD cath, R Lyons TLC Neuro: eyes closed, nods intermittently to questions CV: s1s2 rrr, no m/r/g PULM: even/non-labored, lungs bilaterally with rhonchi GI: midline VAC in place, abd tender to palpation   Extremities: warm/dry, no edema  Skin: no rashes or lesions  Resolved Hospital Problem list   Metabolic alkalosis Abdominal peritonitis with septic shock Circulatory shock, mix of cardiogenic and sepsis off pressors as of 6/15 Cardiorespiratory arrest 6/13 Shock liver Acute metabolic encephalopathy Hypophosphatemia  Assessment & Plan:   Acute Metabolic Encephalopathy -Previous head CT negative, similar unexplained events during this admission  -Suspect multifactorial in setting of critical illness, infection and protracted course  P: Continue supportive care Follow neuro checks  Minimize narcotics / sedating medications as able Encourage sleep / wake cycle   Fever -suspect 2/2 DVT and peritonitis and less likely E-Coli VAP -Has been Tx for E coli in sputum -Blood Cx remain NGTD however pt had anastomosis broken down with holes in same s/p re-ex lap with resection P: Continue zosyn, anidulafungin as above  IR consulted for [facility] Cath placement, appreciate input  Limit lines as able but has been difficult as not able to use L IJ due to clot Continue foley for now  Small Bowel Perforation s/p Resection, Lysis of Adhesions -reexploration 6/14, creation of functional end to end small bowel anastomosis and closure of abdominal wound -reexploration 6/26 with pervious anastomosis broken down with holes in same s/p resection of anastomosis with placement of wound vac P:  TPN per pharmacy  Post operative care, wound care per [facility] Discussed overall goals of care with Dr. Barry Dienes, will consult Palliative to assist with family discussions  Anemia -suspect anemia of critical illness, acute blood loss and nutritional   P:  Trend CBC    Transfuse per ICU guidelines   Acute Hypoxic Respiratory Failure  -3rd intubation. Unfortunately she has not been in a position to attempt SBT and vent liberation. She is unlikely able to be weaned from vent at this point and would require trach/long term mechanical ventilation and LTACH P: PRVC 8cc/kg  Wean PEEP / FiO2 for sats > 90% Pending surgical prognosis, may need trach.  CCS aware.  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%, Takotsubo Cardiomyopathy P: Hold home lisinopril, pravachol, NPO  PRN hydralazine  Volume removal per Nephrology  Consider follow up ECHO   Acute kidney injury from tubular necrosis -baseline creatinine of 0.73 from 11/26/2018 P: CVVHD per Nephrology  Trend BMP / urinary output Replace electrolytes as indicated Avoid nephrotoxic agents, ensure adequate renal perfusion Doubt she will have renal recovery   Type 2 Diabetes P: SSI  Thrombocytopenia  -secondary to sepsis, medications and consumptive  P: Trend CBC  Monitor for bleeding  No indication for transfusion at this time  Moderate to Severe Protein Calorie Malnutrition  P: TPN per pharmacy   Acute DVT- Left IJ, subclavian, axillary  P: Heparin gtt per pharmacy  Monitor for bleeding   Difficult IV Access -L chest port accessed  -DVT of L IJ P: IR consulted for perm cath placement, appreciate input    Best practice:  Diet: TPN. DVT prophylaxis: SCDs,  IV hep GI prophylaxis: Protonix Mobility: Bedrest Code Status: Full code  Disposition: ICU.  Need clear goals of care d/w family as pt is in MSOF and will, at a minimum, require LTACH post hospitalization, possibly including permanent dialysis & tracheostomy.   Labs:   CMP Latest Ref Rng & Units 12/04/2018 12/12/2018 12/12/2018  Glucose 70 - 99 mg/dL 92 153(H) 115(H)  BUN 8 - 23 mg/dL 45(H) 48(H) 58(H)  Creatinine 0.44 - 1.00 mg/dL 1.14(H) 1.29(H) 1.36(H)  Sodium 135 - 145 mmol/L 137 136 135  Potassium 3.5 - 5.1 mmol/L 3.7 3.9 4.0  Chloride 98 - 111 mmol/L 104 99 102  CO2 22 - 32 mmol/L 25 27 25   Calcium 8.9 - 10.3 mg/dL 7.3(L) 7.5(L) 7.5(L)  Total Protein 6.5 - 8.1 g/dL 5.7(L) - -  Total Bilirubin 0.3 - 1.2 mg/dL 1.0 - -  Alkaline Phos 38 - 126 U/L 217(H) - -  AST 15 - 41 U/L 30 - -  ALT 0 - 44 U/L 27 - -   CBC Latest Ref Rng & Units 12/11/2018 12/12/2018 12/12/2018  WBC 4.0 - 10.5 K/uL 22.8(H) - 20.1(H)  Hemoglobin 12.0 - 15.0 g/dL 9.2(L) 9.7(L) 6.6(LL)  Hematocrit 36.0 - 46.0 % 28.5(L) 30.0(L) 21.1(L)  Platelets 150 - 400 K/uL 120(L) - 128(L)    CBG (last 3)  Recent Labs    12/12/18 1841 12/12/18 2325 12/06/2018 0635  GLUCAP 142* 94 96    CC Time: 35 minutes   [name], NP-C [facility] Pgr: [phone] or if no answer [phone] 11/26/2018, 9:11 AM

## 2018-12-13 NOTE — Interval H&P Note (Signed)
History and Physical Interval Note:  12/14/2018 9:01 AM  Carmen Stone  has presented today for surgery, with the diagnosis of small bowel leak.  The various methods of treatment have been discussed with the patient and family. After consideration of risks, benefits and other options for treatment, the patient has consented to  Procedure(s): OPEN ABDOMEN TAKE BACK (N/A) as a surgical intervention.  The patient's history has been reviewed, patient examined, no change in status, stable for surgery.  I have reviewed the patient's chart and labs.  Questions were answered to the patient's satisfaction.     Stark Klein

## 2018-12-13 NOTE — Transfer of Care (Signed)
Immediate Anesthesia Transfer of Care Note  Patient: Carmen Stone  Procedure(s) Performed: EXPLORATORY LAPAROTOMY, SMALL BOWEL RESECTION, PLACEMENT OF WOUND VAC (N/A Abdomen)  Patient Location: ICU  Anesthesia Type:General  Level of Consciousness: sedated  Airway & Oxygen Therapy: Patient remains intubated per anesthesia plan  Post-op Assessment: Report given to RN and Post -op Vital signs reviewed and stable  Post vital signs: Reviewed and stable  Last Vitals:  Vitals Value Taken Time  BP    Temp    Pulse    Resp    SpO2      Last Pain:  Vitals:   11/30/2018 0800  TempSrc: Bladder  PainSc:          Complications: No apparent anesthesia complications

## 2018-12-13 NOTE — Anesthesia Preprocedure Evaluation (Signed)
Anesthesia Evaluation  Patient identified by MRN, date of birth, ID bandGeneral Assessment Comment:Intubated, sedated  Reviewed: Allergy & Precautions, NPO status , Patient's Chart, lab work & pertinent test results, Unable to perform ROS - Chart review only  History of Anesthesia Complications Negative for: history of anesthetic complications  Airway Mallampati: Intubated       Dental   Pulmonary neg pulmonary ROS,       + intubated    Cardiovascular hypertension, +CHF (Takotsubo's cardiomyopathy with EF 20%) and + DVT (IJ)  Normal cardiovascular exam     Neuro/Psych negative neurological ROS  negative psych ROS   GI/Hepatic Neg liver ROS, Perforated bowel w/ open abdomen s/p multiple surgeries   Endo/Other  diabetes, Type 2  Renal/GU ARF and DialysisRenal disease  negative genitourinary   Musculoskeletal negative musculoskeletal ROS (+)   Abdominal   Peds  Hematology  (+) anemia ,   Anesthesia Other Findings Currently off pressors, intubated with fentanyl infusion, on CRRT for acute renal failure  Reproductive/Obstetrics                             Anesthesia Physical Anesthesia Plan  ASA: IV  Anesthesia Plan: General   Post-op Pain Management:    Induction: Inhalational and Intravenous  PONV Risk Score and Plan: 3 and Ondansetron, Dexamethasone, Midazolam and Treatment may vary due to age or medical condition  Airway Management Planned: Oral ETT  Additional Equipment: None  Intra-op Plan:   Post-operative Plan: Post-operative intubation/ventilation  Informed Consent: I have reviewed the patients History and Physical, chart, labs and discussed the procedure including the risks, benefits and alternatives for the proposed anesthesia with the patient or authorized representative who has indicated his/her understanding and acceptance.     Consent reviewed with POA and History  available from chart only  Plan Discussed with:   Anesthesia Plan Comments:         Anesthesia Quick Evaluation

## 2018-12-13 NOTE — Progress Notes (Signed)
Nutrition Follow-up  DOCUMENTATION CODES:   Not applicable  INTERVENTION:  - continue TPN regimen per Pharmacy. - recommend replace NGT d/t current tube being placed on 6/7 (22 days ago) which can be a risk for infection.  - will monitor for ability to re-start trickle feeds in the future.    NUTRITION DIAGNOSIS:   Inadequate oral intake related to inability to eat as evidenced by NPO status. -ongoing  GOAL:   Patient will meet greater than or equal to 90% of their needs -met with TPN regimen  MONITOR:   Vent status, Labs, Weight trends, Skin, I & O's, Other (Comment)(TPN regimen)  ASSESSMENT:   63 year old female with past medical history of type 2 DM, hyperlipidemia, HTN, inflammatory R breast cancer s/p radiation in 2015. Patient presented to the ED on 6/7 with severe abdominal pain, N/V. CT abdomen/pelvis which showed perforated small bowel with large amount of free air and multiple incarcerated incisional hernias.   Significant Events: 6/7- admission; ex lap, small bowel resection, LOAs, and placement of wound vac; NGT placement 6/9- CRRT initiation; return to OR for re-opening of open abdomen with washout 6/12- CRRT stopped 6/14- bowel anastomosis and closure of abdominal wall; CRRT re-started 6/16- initiation of TPN 6/19- extubation 6/20- re-intubation for AMS 6/21- CRRT stopped for break 6/22- initiation of trickle TF 6/23- CRRT re-started 6/24- begin slowly advancing TF to goal rate 6/25- TF stopped and NGT to LIS d/t abdominal distention 6/26- CT abdomen/pelvis (+) for free air; return to OR for ex lap, resection of previous anastomosis, application of wound vac 6/29- return to OR (possible abd closure today)   Patient remains intubated with NGT to LIS with flow sheet indicating ~150 ml output overnight. Patient remains on TPN via triple lumen R subclavian CVC. She is receiving custom TPN at goal rate of 75 ml/hr which is providing 1530 kcal, 171 grams protein.  Estimated protein need increased this AM and Pharmacist made aware. Pharmacist states plan to re-check triglycerides and prealbumin tomorrow AM. Abdomen remains open since surgery on 6/26.   Patient is currently intubated on ventilator support MV: 7.6 L/min Temp (24hrs), Avg:98.9 F (37.2 C), Min:98.4 F (36.9 C), Max:99.7 F (37.6 C) Propofol: none  Medications reviewed; sliding scale novolog, 12.5 mcg IV synthroid/day, 15 mmol IV NaPhos x1 run 6/28. Labs reviewed; CBGs: 94 and 96 mg/dl today, BUN: 45 mg/dl, creatinine: 1.14 mg/dl, Ca: 7.3 mg/dl, Alk Phos elevated, GFR: 59 ml/min, triglycerides (6/21); 81 mg/dl.  Drip; fentanyl @ 100 mcg/hr    NUTRITION - FOCUSED PHYSICAL EXAM:  will re-do on follow-up if feasible (originally done on 6/8)  Diet Order:   Diet Order    None      EDUCATION NEEDS:   No education needs have been identified at this time  Skin:  Skin Assessment: Skin Integrity Issues: Skin Integrity Issues:: DTI, Stage II, Incisions DTI: sacrum (new 6/27) Stage II: L ear Wound Vac: abdomen Incisions: abdomen (6/26)  Last BM:  6/28  Height:   Ht Readings from Last 1 Encounters:  12/09/18 _0  (1.626 m)    Weight:   Wt Readings from Last 1 Encounters:  12/11/18 72.2 kg    Ideal Body Weight:  54.5 kg  BMI:  Body mass index is 27.32 kg/m.  Estimated Nutritional Needs:   Kcal:  1514 kcal  Protein:  195-215 grams  Fluid:  >/= 1.8 L/day     Jarome Matin, MS, RD, LDN, [facility] Inpatient Clinical Dietitian Pager # [phone] After  hours/weekend pager # [phone]

## 2018-12-13 NOTE — Progress Notes (Signed)
[place] for IV heparin Indication: IJ DVT  No Known Allergies  Patient Measurements: Height: 5\' 4"  (162.6 cm) Weight: 159 lb 2.8 oz (72.2 kg) IBW/kg (Calculated) : 54.7 Heparin Dosing Weight: 71 kg  Vital Signs: Temp: 98.2 F (36.8 C) (06/29 1200) Temp Source: Bladder (06/29 1200) BP: 131/51 (06/29 1200) Pulse Rate: 90 (06/29 0900)  Labs: Recent Labs    12/11/18 0409 12/11/18 1256  12/11/18 2133 12/12/18 0545 12/12/18 1800 12/12/18 1832 11/26/2018 0500  HGB 7.1*  --   --   --  6.6*  --  9.7* 9.2*  HCT 21.3*  --   --   --  21.1*  --  30.0* 28.5*  PLT 121*  --   --   --  128*  --   --  120*  APTT 99*  --   --   --  167*  --   --   --   HEPARINUNFRC 0.27* 0.35  --  0.40 0.43  --   --   --   CREATININE 1.94*  --    < >  --  1.36* 1.29*  --  1.14*   < > = values in this interval not displayed.    Estimated Creatinine Clearance: 48.6 mL/min (A) (by C-G formula based on SCr of 1.14 mg/dL (H)).  Assessment: 47 yoF known to pharmacy from TPN and antibiotic dosing; complicated hospital course including cardiac arrest, intubation, VAP, and CRRT, now further complicated with extensive IJ clot (only L sided catheter removed 6/9). Pharmacy to dose heparin infusion.   Baseline INR, aPTT: baseline INR/aPTT mildly elevated but did have recent acute liver injury  Prior anticoagulation: none  Today, 12/05/2018: Heparin off since 05 am for OR, per CCS resume heparin with no bolus 6 hrs after OR at 1630 today Hg 9.2 after 2 U PRBC 6/28, PLTC up to 120    Goal of Therapy: Heparin level 0.3-0.7 units/ml Monitor platelets by anticoagulation protocol: Yes  Plan:  Resume heparin at 1650 units/hr today at 1630 and check 8 hr HL at 0030 am  Daily CBC and heparin level  Monitor for signs of bleeding or thrombosis  Eudelia Bunch, Pharm.D [phone] 11/30/2018 12:49 PM

## 2018-12-14 ENCOUNTER — Encounter (HOSPITAL_COMMUNITY): Payer: Self-pay | Admitting: General Surgery

## 2018-12-14 ENCOUNTER — Inpatient Hospital Stay (HOSPITAL_COMMUNITY): Payer: BLUE CROSS/BLUE SHIELD

## 2018-12-14 DIAGNOSIS — Z7189 Other specified counseling: Secondary | ICD-10-CM

## 2018-12-14 LAB — RENAL FUNCTION PANEL
Albumin: 1.3 g/dL — ABNORMAL LOW (ref 3.5–5.0)
Albumin: 1.4 g/dL — ABNORMAL LOW (ref 3.5–5.0)
Anion gap: 7 (ref 5–15)
Anion gap: 6 (ref 5–15)
BUN: 47 mg/dL — ABNORMAL HIGH (ref 8–23)
BUN: 42 mg/dL — ABNORMAL HIGH (ref 8–23)
CO2: 27 mmol/L (ref 22–32)
CO2: 26 mmol/L (ref 22–32)
Calcium: 7.5 mg/dL — ABNORMAL LOW (ref 8.9–10.3)
Calcium: 7.4 mg/dL — ABNORMAL LOW (ref 8.9–10.3)
Chloride: 104 mmol/L (ref 98–111)
Chloride: 103 mmol/L (ref 98–111)
Creatinine, Ser: 1.15 mg/dL — ABNORMAL HIGH (ref 0.44–1.00)
Creatinine, Ser: 1.07 mg/dL — ABNORMAL HIGH (ref 0.44–1.00)
GFR calc Af Amer: 58 mL/min — ABNORMAL LOW (ref >60)
GFR calc Af Amer: >60 mL/min (ref >60)
GFR calc non Af Amer: 50 mL/min — ABNORMAL LOW (ref >60)
GFR calc non Af Amer: 55 mL/min — ABNORMAL LOW (ref >60)
Glucose, Bld: 113 mg/dL — ABNORMAL HIGH (ref 70–99)
Glucose, Bld: 155 mg/dL — ABNORMAL HIGH (ref 70–99)
Phosphorus: 2.5 mg/dL (ref 2.5–4.6)
Phosphorus: 1.9 mg/dL — ABNORMAL LOW (ref 2.5–4.6)
Potassium: 4.1 mmol/L (ref 3.5–5.1)
Potassium: 4.1 mmol/L (ref 3.5–5.1)
Sodium: 138 mmol/L (ref 135–145)
Sodium: 135 mmol/L (ref 135–145)

## 2018-12-14 LAB — GLUCOSE, CAPILLARY
Glucose-Capillary: 111 mg/dL — ABNORMAL HIGH (ref 70–99)
Glucose-Capillary: 123 mg/dL — ABNORMAL HIGH (ref 70–99)
Glucose-Capillary: 138 mg/dL — ABNORMAL HIGH (ref 70–99)
Glucose-Capillary: 146 mg/dL — ABNORMAL HIGH (ref 70–99)
Glucose-Capillary: 146 mg/dL — ABNORMAL HIGH (ref 70–99)
Glucose-Capillary: 151 mg/dL — ABNORMAL HIGH (ref 70–99)
Glucose-Capillary: 202 mg/dL — ABNORMAL HIGH (ref 70–99)
Glucose-Capillary: 94 mg/dL (ref 70–99)

## 2018-12-14 LAB — CBC
HCT: 28.4 % — ABNORMAL LOW (ref 36.0–46.0)
Hemoglobin: 8.9 g/dL — ABNORMAL LOW (ref 12.0–15.0)
MCH: 30.6 pg (ref 26.0–34.0)
MCHC: 31.3 g/dL (ref 30.0–36.0)
MCV: 97.6 fL (ref 80.0–100.0)
Platelets: 102 10*3/uL — ABNORMAL LOW (ref 150–400)
RBC: 2.91 MIL/uL — ABNORMAL LOW (ref 3.87–5.11)
RDW: 16.7 % — ABNORMAL HIGH (ref 11.5–15.5)
WBC: 23.2 10*3/uL — ABNORMAL HIGH (ref 4.0–10.5)
nRBC: 0 % (ref 0.0–0.2)

## 2018-12-14 LAB — PHOSPHORUS: Phosphorus: 2.5 mg/dL (ref 2.5–4.6)

## 2018-12-14 LAB — PREALBUMIN: Prealbumin: 5.8 mg/dL — ABNORMAL LOW (ref 18–38)

## 2018-12-14 LAB — HEPARIN LEVEL (UNFRACTIONATED)
Heparin Unfractionated: 0.4 IU/mL (ref 0.30–0.70)
Heparin Unfractionated: 0.42 IU/mL (ref 0.30–0.70)

## 2018-12-14 LAB — TRIGLYCERIDES: Triglycerides: 100 mg/dL (ref <150)

## 2018-12-14 LAB — MAGNESIUM: Magnesium: 2.1 mg/dL (ref 1.7–2.4)

## 2018-12-14 MED ORDER — STERILE WATER FOR INJECTION IV SOLN
INTRAVENOUS | Status: AC
  Administered 2018-12-14: 20:00:00 via INTRAVENOUS
  Filled 2018-12-14: qty 1320

## 2018-12-14 NOTE — Progress Notes (Signed)
NAME:  Carmen Stone, MRN:  [id number], DOB:  Nov 19, 1955, LOS: 66 ADMISSION DATE:  11/30/2018, CONSULTATION DATE:  11/21/2018 REFERRING MD:  Dr. Ninfa Linden, Surgery, CHIEF COMPLAINT:  Abdominal pain   Brief History   63 y/o female presented to ER 11/18/2018 with nausea, vomiting, abdominal pain.  CT abd/pelvis showed focal perforated loop of SB with free air with multiple complex ventral hernias.  Underwent emergent laparotomy with SB resection, lysis of adhesions and wound vac.  Remained on vent and pressors post op.  Hospital course complicated by respiratory arrest 6/13, multiple reintubations, intermittent vasopressor needs, AKI on CVVHD.     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 Changing volume goal on CRRT to -100 cc an hour. Weaned 7 hours PSV   6/16 Encephalopathic. Hypertensive, adding labetalol.  IV TNA started 6/17 A little more awake.  Tolerating PSV but SBI exceeds 105.  Volume removal with CVVHD 6/18 Neuro status improved.  Required brief vasoactive drips.  Net negative fluid volume status.PSV 6/19 Extubated 6/20 reintubated for altered mentation, head CT negative  6/21 Tx PRBC x1.  Off vasopressors 6/23 Febrile overnight, Blood Cx. Transfuse 1 U PRBC 6/24 Afebrile. Remains off pressors. 100cc/hr removal /c CRRT. 30% FiO2.  6/25 L IJ, subclavian, axillary  + DVT. Heparin initiated. Remains on CRRT.  6/26  CT ABD/pelvis positive for free air and ascites. Back to OR  for ex lap of perforated viscous, resection of previous anastomosis, application of wound vac.  1U RBCs intra op.   6/28 Back on pressors with hgb <7 / sedated on fent drip, PRBC 6/29 Pending OR for possible closure   Consults:  PCCM 6/7 Renal 6/09 AKI, acidosis Cardiology 6/09 Takotsubo CM   Procedures:  ETT 6/07 >> 6/19, 6/20 >> Rt IJ HD 6/09 >> Rt [place] CVL 6/09 >> Lt radial aline 6/09 >> discontinued  Significant Diagnostic Tests:  CT abd/pelvis 6/07 >> focal perforated loop of SB with free air with multiple complex ventral hernias Echo 6/08 >> EF 20%, Takotsubo CM CT head 6/20 >> negative for any acute findings CT abdomen 6/20 >> bilateral effusions, atelectasis left lower lobe, results noted.    CT Chest 6/20 >> small bilateral pleural effusions with compressive atelectasis, anterior R 4-7 rib fractures CT ABD/Pelvis 6/20 >> small bowel surgical changes, mild circumferential wall thickening of scattered small bowel loops, small to moderate ascites LUE Duplex 6/24 >> DVT L IJ, subclavian, axillary, SVT L cephalic  CT ABD/Pelvis 5/17 >> large volume ascites with intraperitoneal free air   Micro Data:  COVID 6/07 >> negative Tracheal aspirate 6/20 >> E Coli sensitive to zosyn Blood Cx 6/23 >> neg BCx2  6/26 >>     Antimicrobials:  Zosyn 6/07 >> 6/17 Diflucan 6/07 >> 6/10 Cefepime 6/21 >>6/26 Vanco 6/21 >> 6/25 Anidulafungin 6/26 >> Zosyn 6/26 >>  Interim history/subjective:  Afebrile.  No acute events overnight. Family in to visit patient.   Objective   Blood pressure (!) 127/49, pulse 92, temperature (!) 96.1 F (35.6 C), temperature source Bladder, resp. rate 16, height 5\' 4"  (  1.626 m), weight 69.5 kg, SpO2 100 %.    Vent Mode: PRVC FiO2 (%):  [30 %] 30 % Set Rate:  [18 bmp] 18 bmp Vt Set:  [430 mL] 430 mL PEEP:  [5 cmH20] 5 cmH20 Plateau Pressure:  [11 OEV03-50 cmH20] 12 cmH20   Intake/Output Summary (Last 24 hours) at 12/14/2018 0851 Last data filed at 12/14/2018 0800 Gross per 24 hour  Intake 2474.4 ml  Output 2400 ml  Net 74.4 ml   Filed Weights    12/14/2018 0500 12/11/18 0400 12/14/18 0747  Weight: 72.9 kg 72.2 kg 69.5 kg    Examination: General: adult female lying in bed in NAD on vent HEENT: MM pink/moist, ETT Neuro: eyes closed, nods yes to pain CV: s1s2 rrr, no m/r/g PULM: even/non-labored, lungs bilaterally coarse GI: VAC in place, TNA per IV Extremities: warm/dry, no significant edema  Skin: no rashes or lesions  Resolved Hospital Problem list   Metabolic alkalosis Abdominal peritonitis with septic shock Circulatory shock, mix of cardiogenic and sepsis off pressors as of 6/15 Cardiorespiratory arrest 6/13 Shock liver Acute metabolic encephalopathy Hypophosphatemia  Assessment & Plan:   Small Bowel Perforation s/p Resection, Lysis of Adhesions -reexploration 6/14, creation of functional end to end small bowel anastomosis and closure of abdominal wound -reexploration 6/26 with pervious anastomosis broken down with holes in same s/p resection of anastomosis with placement of wound vac -6/29 OR findings of pus in the abd, bilious inflammatory rind on intestines & additional perforation P: TPN per pharmacy, appreciate assistance  Post operative care, wound care per CCS Defer surgical prognostication to CCS Palliative care consulted for goals of care, appreciate input  Family hopeful for patient to be closed   Acute Metabolic Encephalopathy -Previous head CT negative, similar unexplained events during this admission  -Suspect multifactorial in setting of critical illness, infection and protracted course  P: Supportive care Follow neuro exam  Minimize sedating medications as able  Promote sleep / wake cycle   Fever -suspect 2/2 DVT and peritonitis and less likely E-Coli VAP -Has been Tx for E coli in sputum -Blood Cx remain NGTD however pt had anastomosis broken down with holes in same s/p re-ex lap with resection P: ABX, antifungals as above  Will need Perm Cath placement pending goals of care  Limit lines  as able but difficult with arm restrictions, L IJ clot Continue foley  Anemia -suspect anemia of critical illness, acute blood loss and nutritional   P:  Trend CBC  Transfuse per ICU guidelines   Acute Hypoxic Respiratory Failure  -3rd intubation. Unfortunately she has not been in a position to attempt SBT and vent liberation. She is unlikely able to be weaned from vent at this point and would require trach/long term mechanical ventilation and LTACH P: Continue vent support, PRVC 8cc/kg Wean PEEP/FiO2 for sats >90% Pending surgical prognosis, may need trach.  CCS aware.  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%, Takotsubo Cardiomyopathy P: Hold home lisinopril, pravachol > NPO  PRN hydralazine  Volume status per Nephrology  Consider follow up ECHO at some point / possible before discharge   Acute kidney injury from tubular necrosis -baseline creatinine of 0.73 from 11/24/2018 P: CVVHD per Nephrology  Concerned she will not have renal recovery  Trend BMP / urinary output Replace electrolytes as  indicated Avoid nephrotoxic agents, ensure adequate renal perfusion  Type 2 Diabetes P: SSI   Thrombocytopenia  -secondary to sepsis, medications and consumptive  P: Trend CBC  Monitor for bleeding  No indication for transfusion at this time   Moderate to Severe Protein Calorie Malnutrition  P: TPN per pharmacy   Acute DVT- Left IJ, subclavian, axillary  P: Heparin gtt per pharmacy  Monitor for bleeding   Difficult IV Access -L chest port accessed  -DVT of L IJ P: IR consulted for perm cath placement 6/26, will review plan with IR 6/30  Hypothyroidism P: Synthroid IV   Best practice:  Diet: TPN. DVT prophylaxis: SCDs,  IV hep GI prophylaxis: Protonix Mobility: Bedrest  Code Status: Full code Disposition: ICU.  Need clear goals of care d/w family as pt is in MSOF and will, at a minimum, require LTACH post hospitalization, possibly including permanent dialysis & tracheostomy.   Family updated at bedside 6/30 on plan of care.   Labs:   CMP Latest Ref Rng & Units 12/14/2018 12/09/2018 11/26/2018  Glucose 70 - 99 mg/dL 113(H) 142(H) 119(H)  BUN 8 - 23 mg/dL 47(H) 47(H) 55(H)  Creatinine 0.44 - 1.00 mg/dL 1.15(H) 1.22(H) 1.51(H)  Sodium 135 - 145 mmol/L 138 134(L) 136  Potassium 3.5 - 5.1 mmol/L 4.1 4.0 4.2  Chloride 98 - 111 mmol/L 104 103 103  CO2 22 - 32 mmol/L 27 24 25   Calcium 8.9 - 10.3 mg/dL 7.5(L) 6.7(L) 7.6(L)  Total Protein 6.5 - 8.1 g/dL - - -  Total Bilirubin 0.3 - 1.2 mg/dL - - -  Alkaline Phos 38 - 126 U/L - - -  AST 15 - 41 U/L - - -  ALT 0 - 44 U/L - - -   CBC Latest Ref Rng & Units 12/14/2018 11/20/2018 12/12/2018  WBC 4.0 - 10.5 K/uL 23.2(H) 22.8(H) -  Hemoglobin 12.0 - 15.0 g/dL 8.9(L) 9.2(L) 9.7(L)  Hematocrit 36.0 - 46.0 % 28.4(L) 28.5(L) 30.0(L)  Platelets 150 - 400 K/uL 102(L) 120(L) -    CBG (last 3)  Recent Labs    12/06/2018 1705 12/14/18 0033 12/14/18 0605  GLUCAP 136* 123* 94    CC Time: 35 minutes.   [name], NP-C [facility] Pgr: [phone] or if no answer [phone] 12/14/2018, 8:50 AM

## 2018-12-14 NOTE — Progress Notes (Signed)
[facility] ROUNDING NOTE   Subjective:   This is a 63 year old lady with acute kidney injury last baseline 0.79 06/04/2018.  She had acute oliguric acute tubular necrosis in the setting of septic shock from bowel perforation.  Was on CRRT from 6/ 9  to  6/12 was held after cardiac arrest.  She resumed 11/19/2018 after abdominal closure.  Still no signs of renal recovery.  Status post small bowel perforation due to closed-loop SBO and septic shock followed by CCM underwent closure 12/09/2018 new recent bowel leak due to nonhealing anastomosis.  Went back to the OR 12/11/2018 for resection of anastomosis.  She went back to the OR 11/16/2018.  She has a large wound VAC.  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  No history of diabetes mellitus type 2.  No recorded urine output.  Has been kept even on CRRT  Blood pressure 89/42 pulse 72 temperature 97 O2 sats 100% FiO2 30%  IV Levophed IV heparin IV TPN  IV Zosyn  Sodium 138 potassium 4.1 chloride 104 CO2 27 BUN 47 creatinine 1.15 glucose 113 calcium 7.5 phosphorus 2.5 magnesium 2.1 albumin 1.3 WBC 23.2 hemoglobin 8.9 platelets 102  CT scan abdomen 12/09/2018 shows large amount of intraperitoneal free fluid which is progressed to large volumes ascites.  Prominent intraperitoneal free air collection under the anterior abdominal wall with gas scattered in the central small bowel mesentery.  Patchy airspace disease.  Levothyroxine 12.5 mcg IV daily Protonix 40 mg IV every 24 hours   Objective:  Vital signs in last 24 hours:  Temp:  [96.1 F (35.6 C)-99.3 F (37.4 C)] 97.3 F (36.3 C) (06/30 0900) Pulse Rate:  [80-101] 92 (06/30 0800) Resp:  [15-24] 24 (06/30 0900) BP: (91-147)/(31-60) 115/55 (06/30 0900) SpO2:  [97 %-100 %] 100 % (06/30 0800) FiO2 (%):  [30 %] 30 % (06/30 0800) Weight:  [69.5 kg] 69.5 kg (06/30 0747)  Weight change:  Filed Weights   12/14/2018 0500 12/11/18 0400 12/14/18  0747  Weight: 72.9 kg 72.2 kg 69.5 kg    Intake/Output: I/O last 3 completed shifts: In: 3861.9 [I.V.:3408.7; IV Piggyback:453.1] Out: 5498 [Urine:55; Emesis/NG output:360; Drains:385; YMEBR:8309; Blood:100]   Intake/Output this shift:  Total I/O In: 302.7 [I.V.:302.7] Out: 305 [Drains:40; Other:265] Intubated and sedated making grimacing faces. CVS- RRR no JVP RS- CTA intubated with rhonchorous breath sounds ABD- BS present soft non-distended EXT-1-2+ lower extremity edema   Basic Metabolic Panel: Recent Labs  Lab 12/04/2018 0352  12/11/18 0409  12/12/18 0545 12/12/18 1800 12/05/2018 0500 12/12/2018 1600 12/14/18 0551  NA 137   < > 136   < > 135 136 136  137 134* 138  K 4.8   < > 4.5   < > 4.0 3.9 4.2  3.7 4.0 4.1  CL 100   < > 102   < > 102 99 103  104 103 104  CO2 24   < > 23   < > '25 27 25  25 24 27  ' GLUCOSE 116*   < > 124*   < > 115* 153* 119*  92 142* 113*  BUN 74*   < > 86*   < > 58* 48* 55*  45* 47* 47*  CREATININE 1.60*   < > 1.94*   < > 1.36* 1.29* 1.51*  1.14* 1.22* 1.15*  CALCIUM 7.9*   < > 7.4*   < > 7.5* 7.5* 7.6*  7.3* 6.7* 7.5*  MG 2.5*  --  2.2  --  2.2  --  2.3  --  2.1  PHOS 3.7   < > 3.6   < > 2.0* 4.6 3.5 3.2 2.5  2.5   < > = values in this interval not displayed.    Liver Function Tests: Recent Labs  Lab 12/09/18 0455  12/12/18 0545 12/12/18 1800 12/05/2018 0500 11/18/2018 1600 12/14/18 0551  AST 28  --   --   --  30  --   --   ALT 39  --   --   --  27  --   --   ALKPHOS 173*  --   --   --  217*  --   --   BILITOT 0.9  --   --   --  1.0  --   --   PROT 7.0  --   --   --  5.7*  --   --   ALBUMIN 1.7*   < > 1.3* 1.5* 1.5*  1.4* 1.3* 1.3*   < > = values in this interval not displayed.   No results for input(s): LIPASE, AMYLASE in the last 168 hours. No results for input(s): AMMONIA in the last 168 hours.  CBC: Recent Labs  Lab 12/07/2018 1415 12/11/18 0409 12/12/18 0545 12/12/18 1832 12/05/2018 0500 12/14/18 0551  WBC 12.8* 17.6*  20.1*  --  22.8* 23.2*  NEUTROABS  --   --   --   --  17.3*  --   HGB 7.5* 7.1* 6.6* 9.7* 9.2* 8.9*  HCT 23.3* 21.3* 21.1* 30.0* 28.5* 28.4*  MCV 95.1 93.4 94.6  --  92.8 97.6  PLT 108* 121* 128*  --  120* 102*    Cardiac Enzymes: No results for input(s): CKTOTAL, CKMB, CKMBINDEX, TROPONINI in the last 168 hours.  BNP: Invalid input(s): POCBNP  CBG: Recent Labs  Lab 12/11/2018 0635 12/01/2018 1202 12/12/2018 1705 12/14/18 0033 12/14/18 0605  GLUCAP 96 118* 136* 123* 94    Microbiology: Results for orders placed or performed during the hospital encounter of 11/18/2018  SARS Coronavirus 2 (CEPHEID - Performed in [place] hospital lab), Hosp Order     Status: None   Collection Time: 11/28/2018  3:55 AM   Specimen: Nasopharyngeal Swab  Result Value Ref Range Status   SARS Coronavirus 2 NEGATIVE NEGATIVE Final    Comment: (NOTE) If result is NEGATIVE SARS-CoV-2 target nucleic acids are NOT DETECTED. The SARS-CoV-2 RNA is generally detectable in upper and lower  respiratory specimens during the acute phase of infection. The lowest  concentration of SARS-CoV-2 viral copies this assay can detect is 250  copies / mL. A negative result does not preclude SARS-CoV-2 infection  and should not be used as the sole basis for treatment or other  patient management decisions.  A negative result may occur with  improper specimen collection / handling, submission of specimen other  than nasopharyngeal swab, presence of viral mutation(s) within the  areas targeted by this assay, and inadequate number of viral copies  (<250 copies / mL). A negative result must be combined with clinical  observations, patient history, and epidemiological information. If result is POSITIVE SARS-CoV-2 target nucleic acids are DETECTED. The SARS-CoV-2 RNA is generally detectable in upper and lower  respiratory specimens dur ing the acute phase of infection.  Positive  results are indicative of active infection with  SARS-CoV-2.  Clinical  correlation with patient history and other diagnostic information is  necessary to determine patient infection status.  Positive results do  not rule out bacterial infection or co-infection with other viruses. If result is PRESUMPTIVE POSTIVE SARS-CoV-2 nucleic acids MAY BE PRESENT.   A presumptive positive result was obtained on the submitted specimen  and confirmed on repeat testing.  While 2019 novel coronavirus  (SARS-CoV-2) nucleic acids may be present in the submitted sample  additional confirmatory testing may be necessary for epidemiological  and / or clinical management purposes  to differentiate between  SARS-CoV-2 and other Sarbecovirus currently known to infect humans.  If clinically indicated additional testing with an alternate test  methodology [phone]) is advised. The SARS-CoV-2 RNA is generally  detectable in upper and lower respiratory sp ecimens during the acute  phase of infection. The expected result is Negative. Fact Sheet for Patients:  StrictlyIdeas.no Fact Sheet for Healthcare Providers: BankingDealers.co.za This test is not yet approved or cleared by the [place] FDA and has been authorized for detection and/or diagnosis of SARS-CoV-2 by FDA under an Emergency Use Authorization (EUA).  This EUA will remain in effect (meaning this test can be used) for the duration of the COVID-19 declaration under Section 564(b)(1) of the Act, 21 U.S.C. section 360bbb-3(b)(1), unless the authorization is terminated or revoked sooner. Performed at [facility], [place] [address], [place], [place] [address]   MRSA PCR Screening     Status: None   Collection Time: 11/22/18 10:14 AM   Specimen: Nasal Mucosa; Nasopharyngeal  Result Value Ref Range Status   MRSA by PCR NEGATIVE NEGATIVE Final    Comment:        The GeneXpert MRSA Assay (FDA approved for NASAL specimens only), is one  component of a comprehensive MRSA colonization surveillance program. It is not intended to diagnose MRSA infection nor to guide or monitor treatment for MRSA infections. Performed at [facility], [place] [address], [place], [place] [address]   Culture, respiratory (non-expectorated)     Status: None   Collection Time: 12/05/18 11:25 AM   Specimen: Tracheal Aspirate; Respiratory  Result Value Ref Range Status   Specimen Description   Final    TRACHEAL ASPIRATE Performed at [place] [address] [place], [place] [address]    Special Requests   Final    NONE Performed at [facility], [place] [address] [place], [place] [address]    Gram Stain   Final    RARE WBC PRESENT, PREDOMINANTLY PMN NO ORGANISMS SEEN Performed at [facility], [place] [address], [place], [place] [address]    Culture RARE ESCHERICHIA COLI  Final   Report Status 12/07/2018 FINAL  Final   Organism ID, Bacteria ESCHERICHIA COLI  Final      Susceptibility   Escherichia coli - MIC*    AMPICILLIN >=32 RESISTANT Resistant     CEFAZOLIN <=4 SENSITIVE Sensitive     CEFEPIME <=1 SENSITIVE Sensitive     CEFTAZIDIME <=1 SENSITIVE Sensitive     CEFTRIAXONE <=1 SENSITIVE Sensitive     CIPROFLOXACIN <=0.25 SENSITIVE Sensitive     GENTAMICIN <=1 SENSITIVE Sensitive     IMIPENEM <=0.25 SENSITIVE Sensitive     TRIMETH/SULFA <=20 SENSITIVE Sensitive     AMPICILLIN/SULBACTAM >=32 RESISTANT Resistant     PIP/TAZO <=4 SENSITIVE Sensitive     Extended ESBL NEGATIVE Sensitive     * RARE ESCHERICHIA COLI  Culture, blood (routine x 2)     Status: None   Collection Time: 12/07/18 10:50 AM   Specimen: BLOOD  LEFT ARM  Result Value Ref Range Status   Specimen Description   Final    BLOOD LEFT ARM Performed at [facility], [address] [place], [place] [address]    Special Requests   Final    BOTTLES DRAWN AEROBIC AND ANAEROBIC Blood Culture adequate  volume Performed at [place] [address], [place], [place] [address]    Culture   Final    NO GROWTH 5 DAYS Performed at [facility], [place] [address], [place], [place] [address]    Report Status 12/12/2018 FINAL  Final  Culture, blood (routine x 2)     Status: None (Preliminary result)   Collection Time: 11/20/2018 12:24 PM   Specimen: BLOOD  Result Value Ref Range Status   Specimen Description   Final    BLOOD RIGHT ARM Performed at [place] [address], [place], [place] [address]    Special Requests   Final    BOTTLES DRAWN AEROBIC AND ANAEROBIC Blood Culture adequate volume Performed at [place] [address], [place], [place] [address]    Culture   Final    NO GROWTH 4 DAYS Performed at [facility], [place] [address] [place], [place] [address]    Report Status PENDING  Incomplete  Culture, blood (routine x 2)     Status: None (Preliminary result)   Collection Time: 12/05/2018  1:03 PM   Specimen: BLOOD  Result Value Ref Range Status   Specimen Description   Final    BLOOD RIGHT ANTECUBITAL Performed at [place] [address] [place], [place] [address]    Special Requests   Final    BOTTLES DRAWN AEROBIC ONLY Blood Culture adequate volume Performed at [place] [address], [place], [place] [address]    Culture   Final    NO GROWTH 4 DAYS Performed at [facility], [place] [address], [place], [place] [address]    Report Status PENDING  Incomplete    Coagulation Studies: No results for input(s): LABPROT, INR in the last 72 hours.  Urinalysis: No results for input(s): COLORURINE, LABSPEC, PHURINE, GLUCOSEU, HGBUR, BILIRUBINUR, KETONESUR, PROTEINUR, UROBILINOGEN, NITRITE, LEUKOCYTESUR in the last 72 hours.  Invalid input(s): APPERANCEUR    Imaging: Dg Chest Port 1 View  Result Date: 12/14/2018 CLINICAL DATA:  Respiratory  failure. EXAM: PORTABLE CHEST 1 VIEW COMPARISON:  12/01/2018. FINDINGS: Endotracheal tube NG tube, NG tube, left PowerPort catheter, right IJ line, right subclavian line in stable position. Heart size stable. Mild right base infiltrate noted on today's exam. Small right pleural effusion. IMPRESSION: 1.  Lines and tubes in stable position. 2. Mild right base infiltrate noted on today's exam. Small right pleural effusion. Electronically Signed   By: Marcello Moores  Register   On: 12/14/2018 06:18   Dg Chest Port 1 View  Result Date: 12/11/2018 CLINICAL DATA:  Central line complication EXAM: PORTABLE CHEST 1 VIEW COMPARISON:  12/11/2018 FINDINGS: Endotracheal tube has been advanced, and is now positioned with tip just above the carina. Otherwise no change in AP portable examination with support apparatus including right subclavian vascular catheter, right neck large bore vascular catheter or sheath, left subclavian port catheter, and esophagogastric tube with tip and side port below the diaphragm. The heart and mediastinum are unremarkable. No acute airspace opacity. IMPRESSION: 1. Endotracheal tube has been advanced, and is now positioned with tip just above the carina. Consider slight  retraction. 2. Otherwise no change in AP portable examination with support apparatus including right subclavian vascular catheter, right neck large bore vascular catheter or sheath, left subclavian port catheter, and esophagogastric tube with tip and side port below the diaphragm. The heart and mediastinum are unremarkable. No acute airspace opacity. Electronically Signed   By: Eddie Candle M.D.   On: 12/07/2018 19:43     Medications:   .  prismasol BGK 4/2.5 400 mL/hr at 12/14/18 0135  .  prismasol BGK 4/2.5 200 mL/hr at 12/14/2018 0837  . sodium chloride Stopped (12/12/18 1005)  . sodium chloride Stopped (12/11/18 1655)  . anidulafungin Stopped (11/26/2018 1723)  . fentaNYL infusion INTRAVENOUS 100 mcg/hr (12/14/18 0600)  .  heparin 1,650 Units/hr (12/14/18 1000)  . norepinephrine (LEVOPHED) Adult infusion 2.5 mcg/min (12/14/18 1000)  . piperacillin-tazobactam (ZOSYN)  IV Stopped (12/14/18 0629)  . prismasol BGK 4/2.5 1,500 mL/hr at 12/14/18 0557  . TPN ADULT (ION) 75 mL/hr at 12/14/18 1000  . TPN ADULT (ION)     . chlorhexidine gluconate (MEDLINE KIT)  15 mL Mouth Rinse BID  . Chlorhexidine Gluconate Cloth  6 each Topical Daily  . dorzolamide-timolol  1 drop Both Eyes BID  . insulin aspart  3-9 Units Subcutaneous Q6H  . levothyroxine  12.5 mcg Intravenous Daily  . mouth rinse  15 mL Mouth Rinse 10 times per day  . pantoprazole (PROTONIX) IV  40 mg Intravenous Q24H   sodium chloride, sodium chloride, albuterol, alteplase, fentaNYL (SUBLIMAZE) injection, heparin, hydrALAZINE, metoprolol tartrate, sodium chloride, sodium chloride flush  Assessment/ Plan:   Acute kidney injury with very little signs of recovery.  She is anuric she is requiring CRRT.  Baseline serum creatinine 0.9.  Acute kidney injury following perforated small bowel with fecal peritonitis and wound debridement.  Hypotension/volumes continue to keep even.  She may be able to tolerate some volume removal.  She is now on IV pressors  Electrolytes appear to be stable at this time  Acid-base balance stable at this time  Perforated small bowel with acute kidney injury 6/9 to 11/26/2018.  New recent bowel leak due to nonhealing of anastomosis went to the OR 11/24/2018 12/14/2018 appreciate assistance from Dr. Barry Dienes  Diabetes mellitus per primary team  Nutrition continues on TNA     LOS: [place] '@TODAY' '@10' :24 AM

## 2018-12-14 NOTE — Progress Notes (Signed)
PHARMACY - ADULT TOTAL PARENTERAL NUTRITION CONSULT NOTE   Pharmacy Consult for TPN Indication: prolonged ileus  HPI: 6 yoF admitted 6/7 with perforated small bowel from closed-loop obstruction and underwent emergent exploratory laparotomy. Returned to OR on 6/9 and again on 6/14 for closure of abdominal wall.  Patient coded on 12-02-2022 and developed multiorgan failure. Remains intubated & on CRRT. Pressors have been weaned off and she is completing antibiotic course for sepsis/PNA.  NPO since admission on 6/7. Pharmacy consulted to start TPN 6/16. D/t acute liver injury from shock, initiated TPN at low rate & advance slowly as requested by surgery.  Patient Measurements: Height: '5\' 4"'  (162.6 cm) Weight: 159 lb 2.8 oz (72.2 kg) IBW/kg (Calculated) : 54.7 TPN AdjBW (KG): 59.7 Body mass index is 27.32 kg/m. Usual Weight: 90kg   Intake/Output Summary (Last 24 hours) at 12/14/2018 0705 Last data filed at 12/14/2018 0600 Gross per 24 hour  Intake 2318.73 ml  Output 2245 ml  Net 73.73 ml    Recent Labs    12/12/18 0545  11/30/2018 0500 12/02/2018 1600 12/14/18 0551  NA 135   < > 136  137 134* 138  K 4.0   < > 4.2  3.7 4.0 4.1  CL 102   < > 103  104 103 104  CO2 25   < > '25  25 24 27  ' GLUCOSE 115*   < > 119*  92 142* 113*  BUN 58*   < > 55*  45* 47* 47*  CREATININE 1.36*   < > 1.51*  1.14* 1.22* 1.15*  CALCIUM 7.5*   < > 7.6*  7.3* 6.7* 7.5*  PHOS 2.0*   < > 3.5 3.2 2.5  MG 2.2  --  2.3  --   --   ALBUMIN 1.3*   < > 1.5*  1.4* 1.3* 1.3*  ALKPHOS  --   --  217*  --   --   AST  --   --  30  --   --   ALT  --   --  27  --   --   BILITOT  --   --  1.0  --   --   TRIG  --   --  108  --   --   PREALBUMIN  --   --  6.8*  --   --    < > = values in this interval not displayed.    Significant events:  6/18: OK to increase to goal rate per surgery 6/22: CRRT stopped for holiday; CBG 69 overnight, D50 given but TPN continued (55 units insulin in TPN); BS and BM overnight -  initiating trickle feeds 6/23: resuming CRRT, return to high-protein TPN; fevers overnight, CCM removing some central lines; will use chemo port for TPN 6/24: began adding lipids to TPN formula 6/25: trickle feeds stopped d/t abd distention 6/26: CRRT clotted off overnight; patient to OR this AM for ex lap; RN attempting to restart CRRT post-op but still having issues at this time 6/27: CRRT appears to have been successfully resumed overnight   Central access: 12/04/2018 TPN start date: 11/30/18  ASSESSMENT  Current Nutrition: NPO IVF: On CRRT to keep net neutral I/O  Today:   Glucose (goal 100-150) - Hx DM on 70/30 Novolog 20 units BID PTA.  CBGs well controlled (range 94-136)  25 units insulin in TPN + ICU resistant scale; 6 units SSI required yesterday  Electrolytes - on CRRT; WNL   Renal - AKI; SCr, bicarb normalized on CRRT, BUN remains elevated  LFTs - albumin remains low; alk phos 217  TGs - WNL (6/30)  Prealbumin - down to 5.8 6/30, reflects inflammation and critical illness,  NUTRITIONAL GOALS                                                                                             RD recs: (6/29):  Kcal:1514 kcal, Protein:195-215 grams Fluid:>/= 1.8 L/day  PLAN                                                                                                                         At 1800 today:  Continue TPN at 75 ml/hr  Adjust custom TPN at goal rate of 75 ml/hr  to provide: 198 g/day protein, 1512 Kcal/day = 100% support  Electrolytes: continue to hold Mag, small K, increased Na, decr Phos; Cl:Ac = 1:2  TPN to contain standard multivitamins, trace elements MWF only due to national backorder.  IVF per MD  Continue ICU resistant-scale glycemic control orders q6h; continue 25 units regular insulin in TPN while TFs held  TPN lab panels Mondays &  Thursdays  Ordered BID renal function panels while on CRRT  Eudelia Bunch, Pharm.D 12/14/2018 7:05 AM

## 2018-12-14 NOTE — Progress Notes (Signed)
1 Day Post-Op    OE:[id number] pain  Subjective: No real change.  Continuing support.  Family is here to see her.  Objective: Vital signs in last 24 hours: Temp:  [96.1 F (35.6 C)-99.3 F (37.4 C)] 96.3 F (35.7 C) (06/30 0700) Pulse Rate:  [80-101] 80 (06/30 0330) Resp:  [15-22] 16 (06/30 0700) BP: (91-147)/(31-60) 111/60 (06/30 0630) SpO2:  [96 %-100 %] 100 % (06/30 0330) FiO2 (%):  [30 %] 30 % (06/30 0330) Last BM Date: 12/12/18(smear) 2466 IV Urine 40 cc NG 210 drain210 CRRT 1826 Afebrile, VSS  BP on Norepinephrine CMP OK   Intake/Output from previous day: 06/29 0701 - 06/30 0700 In: 2466.6 [I.V.:2112.6; IV Piggyback:354] Out: 2386 [Urine:40; Emesis/NG output:210; Drains:210; Blood:100] Intake/Output this shift: No intake/output data recorded.  General appearance: Sedated on the vent. Resp: clear to auscultation bilaterally and Clear anteriorly still on full vent support. GI: Wound VAC is in place no bowel sounds.  Lab Results:  Recent Labs    11/22/2018 0500 12/14/18 0551  WBC 22.8* 23.2*  HGB 9.2* 8.9*  HCT 28.5* 28.4*  PLT 120* 102*    BMET Recent Labs    12/11/2018 1600 12/14/18 0551  NA 134* 138  K 4.0 4.1  CL 103 104  CO2 24 27  GLUCOSE 142* 113*  BUN 47* 47*  CREATININE 1.22* 1.15*  CALCIUM 6.7* 7.5*   PT/INR No results for input(s): LABPROT, INR in the last 72 hours.  Recent Labs  Lab 12/09/18 0455  12/12/18 0545 12/12/18 1800 12/01/2018 0500 11/16/2018 1600 12/14/18 0551  AST 28  --   --   --  30  --   --   ALT 39  --   --   --  27  --   --   ALKPHOS 173*  --   --   --  217*  --   --   BILITOT 0.9  --   --   --  1.0  --   --   PROT 7.0  --   --   --  5.7*  --   --   ALBUMIN 1.7*   < > 1.3* 1.5* 1.5*  1.4* 1.3* 1.3*   < > = values in this interval not displayed.     Lipase     Component Value Date/Time   LIPASE 24 12/02/2018 0355     Medications: . chlorhexidine gluconate (MEDLINE KIT)  15 mL Mouth Rinse BID  .  Chlorhexidine Gluconate Cloth  6 each Topical Daily  . dorzolamide-timolol  1 drop Both Eyes BID  . insulin aspart  3-9 Units Subcutaneous Q6H  . levothyroxine  12.5 mcg Intravenous Daily  . mouth rinse  15 mL Mouth Rinse 10 times per day  . pantoprazole (PROTONIX) IV  40 mg Intravenous Q24H   .  prismasol BGK 4/2.5 400 mL/hr at 12/14/18 0135  .  prismasol BGK 4/2.5 200 mL/hr at 12/14/2018 0837  . sodium chloride Stopped (12/12/18 1005)  . sodium chloride Stopped (12/11/18 1655)  . anidulafungin Stopped (11/20/2018 1723)  . fentaNYL infusion INTRAVENOUS 100 mcg/hr (12/14/18 0600)  . heparin 1,650 Units/hr (12/14/18 0700)  . norepinephrine (LEVOPHED) Adult infusion 2.5 mcg/min (12/14/18 0700)  . piperacillin-tazobactam (ZOSYN)  IV Stopped (12/14/18 0629)  . prismasol BGK 4/2.5 1,500 mL/hr at 12/14/18 0557  . TPN ADULT (ION) 75 mL/hr at 12/14/18 0700  . TPN ADULT (ION)      Assessment/Plan Sepsis Ongoing hypotension -low-dose Levophed Acute kidney injury/tubular  necrosis-CRRT Acute respiratory arrest 12/04/2018 Acute hypoxic respiratory failure/atelectasis/pleural effusion: Reintubated 5/50/15 Acute metabolic encephalopathy -head CT 12/04/2018: No acute abnormality Takatsubo's cardiomyopathy: EF ~ 20% Anemia transfused 12/05/2018 DVT left IJ 6/24 -Heparin drip Thrombocytopenia Severe malnutrition -prealbumin 6.8(6/29) >>5.8(6/30 Leukocytosis 17.6 (6/27) >> 20.1(6/28) >> 22.8(6/29) >>23.2(6/30) Hypothyroid  Perforated small bowel with closed-loop obstruction 1. Exploratory laparotomy small bowel resection, lysis of adhesions, 1.5 hours, placement of wound VAC 12/07/2018 Dr. Coralie Keens, POD #23 2.  Exploratory laparotomy, washout and placement of wound VAC 12/14/2018 Dr. Erroll Luna, POD #19 3.  Exploratory laparotomy with anastomosis of small bowel and closure of abdominal wall 11/26/2018 Dr. Marcello Moores Cornett POD#16 4.  Exploratory laparotomy, resection of previous anastomosis,  wound VAC placement 11/16/2018 Dr. Autumn Messing POD #4 5.  Reopening of recent laparotomy, small bowel resection, placement of wound VAC 11/17/2018, Dr. Stark Klein  POD#1 (FINDINGS:Pus throughout abdomen, though minimal succus. Bilious rind over intestines. Small hole on distal segment of small bowel with small amount of bile)  FEN: N.p.o./TPN ID: Maxipime 6/21 -6/25; Eraxis 6/26>> day 5; Zosyn 6/26 >> day 5 DVT: Heparin drip Follow-up: To be determined POC: Mattier,George Brother [phone]  [phone]  Matier,Vickie    [phone]   Palliative consult requested  Plan: Continued medical support.  Family plans to talk with palliative today.       LOS: 23 days    Carmen Stone 12/14/2018 [phone]

## 2018-12-14 NOTE — Progress Notes (Signed)
Palliative Medicine Team consult was received.   I called and spoke with patient's brother, Iona Beard, who has been acting as her surrogate for medical decisions.  We are planning for a meeting this evening at 5:45-6:00 PM.   If there are urgent needs or questions please call [phone]. Thank you for consulting out team to assist with this patients care.  Micheline Rough, MD [facility] Team [phone]

## 2018-12-14 NOTE — Anesthesia Postprocedure Evaluation (Signed)
Anesthesia Post Note  Patient: Carmen Stone  Procedure(s) Performed: EXPLORATORY LAPAROTOMY, SMALL BOWEL RESECTION, PLACEMENT OF WOUND VAC (N/A Abdomen)     Patient location during evaluation: SICU Anesthesia Type: General Level of consciousness: sedated Pain management: pain level controlled Vital Signs Assessment: post-procedure vital signs reviewed and stable Respiratory status: patient remains intubated per anesthesia plan Cardiovascular status: stable Postop Assessment: no apparent nausea or vomiting Anesthetic complications: no    Last Vitals:  Vitals:   12/14/18 0700 12/14/18 0719  BP: (!) 127/49   Pulse:    Resp: 16   Temp: (!) 35.7 C   SpO2:  100%    Last Pain:  Vitals:   12/13/2018 1600  TempSrc: Bladder  PainSc:                  Carmen Stone

## 2018-12-14 NOTE — Progress Notes (Signed)
ANTICOAGULATION CONSULT NOTE - Follow Up Consult  Pharmacy Consult for Heparin Indication: IJ DVT  No Known Allergies  Patient Measurements: Height: 5\' 4"  (162.6 cm) Weight: 159 lb 2.8 oz (72.2 kg) IBW/kg (Calculated) : 54.7 Heparin Dosing Weight:   Vital Signs: Temp: 96.3 F (35.7 C) (06/30 0430) BP: 128/44 (06/30 0500) Pulse Rate: 80 (06/30 0330)  Labs: Recent Labs    12/12/18 0545 12/12/18 1800 12/12/18 1832 12/08/2018 0500 12/06/2018 1600 12/14/18 0034  HGB 6.6*  --  9.7* 9.2*  --   --   HCT 21.1*  --  30.0* 28.5*  --   --   PLT 128*  --   --  120*  --   --   APTT 167*  --   --   --   --   --   HEPARINUNFRC 0.43  --   --  <0.10*  --  0.40  CREATININE 1.36* 1.29*  --  1.51*  1.14* 1.22*  --     Estimated Creatinine Clearance: 45.4 mL/min (A) (by C-G formula based on SCr of 1.22 mg/dL (H)).   Medications:  Infusions:  .  prismasol BGK 4/2.5 400 mL/hr at 12/14/18 0135  .  prismasol BGK 4/2.5 200 mL/hr at 12/14/2018 0837  . sodium chloride Stopped (12/12/18 1005)  . sodium chloride Stopped (12/11/18 1655)  . anidulafungin Stopped (11/20/2018 1723)  . fentaNYL infusion INTRAVENOUS 100 mcg/hr (12/14/18 0500)  . heparin 1,650 Units/hr (12/14/18 0500)  . norepinephrine (LEVOPHED) Adult infusion 2.5 mcg/min (12/14/18 0500)  . piperacillin-tazobactam (ZOSYN)  IV Stopped (12/14/18 0058)  . prismasol BGK 4/2.5 1,500 mL/hr at 12/14/18 0237  . TPN ADULT (ION) 75 mL/hr at 12/14/18 0500    Assessment: Heparin level at goal.  No heparin issues noted.  Goal of Therapy:  Heparin level 0.3-0.7 units/ml Monitor platelets by anticoagulation protocol: Yes   Plan:  Continue heparin drip at current rate Recheck level with AM labs  Tyler Deis, Shea Stakes Crowford 12/14/2018,5:48 AM

## 2018-12-14 NOTE — Consult Note (Signed)
Consultation Note Date: 12/14/2018   Patient Name: Carmen Stone  DOB: 05-Jan-1956  MRN: [id number]  Age / Sex: 63 y.o., female  PCP: Jettie Booze, NP Referring Physician: Edison Pace, Md, MD  Reason for Consultation: Establishing goals of care  HPI/Patient Profile: 63 y.o. female  with past medical history of  admitted on 11/20/2018 with abdominal pain from perforation and complicated clinical course with multiple surgeries requiring resection of large portion of small bowel, code blue, and multisystem organ failure (EF 20%, on CRRT and vent dependent).    Clinical Assessment and Goals of Care: I met today with patient's brother and daughter.  Initial conversation was with patient's daughter until her brother Carmen Stone arrived.  She reports that the most important things to her mother are family, faith, and her work Psychologist, forensic.  She feels that the care team has been doing a good job explaining things to them and they are coming to terms with the fact that we are approaching end of life.  She pointed out that her uncle is HCPOA and will need to make final decisions.  Patient's brother/HCPOA arrived and we moved to another room at his request.    We discussed clinical course this admission including multiple surgical interventions, multisystem organ failure, and prior code blue.  Her brother reports understanding seriousness of her condition and that he is waiting to hear "if she is going to have more surgery or not."  I shared my concern that with her continued decline, we are at a point where she is going to decline moving forward regardless of interventions.  Discussed complications of multisystem organ failure as well as my conversation with Dr. Barry Dienes that she agrees her underlying issues are not going to be fixed with further surgical intervention.   Concepts specific to code status and care plan this hospitalization  discussed.  We discussed difference between a aggressive medical intervention path and a palliative, comfort focused care path.   Concept of Hospice and Palliative Care were discussed.  Questions and concerns addressed.   PMT will continue to support holistically.  HCPOA: Brother, Carmen Stone   SUMMARY OF RECOMMENDATIONS   - Met with family including her daughter and brother (34).  Her brother reports understanding her situation and need to make decisions regarding care plan, but feels that he needs to have time for them to discuss as a family.  He and patient's daughter Carmen Stone) are very appreciative of information but not ready to change care plan this evening.  Brother reports that they may be able to make decisions tomorrow.  Code Status/Advance Care Planning:  Full code  Prognosis:   Poor  Discharge Planning: To Be Determined      Primary Diagnoses: Present on Admission: . Perforated viscus . Acute respiratory failure with hypoxia and hypercapnia (HCC) . Toxic metabolic encephalopathy . Thrombocytopenia, acquired ([place])   I have reviewed the medical record, interviewed the patient and family, and examined the patient. The following aspects are pertinent.  Past Medical History:  Diagnosis Date  .  Diabetes mellitus, type II ([place])   . Hyperlipemia   . Hypertension   . 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 Radiation 06/20/14-07/28/14   Social History   Socioeconomic History  . Marital status: Single    Spouse name: Not on file  . Number of children: 1  . Years of education: Not on file  . Highest education level: Not on file  Occupational History  . Occupation: maid, Training and development officer  Social Needs  . Financial resource strain: Not on file  . Food insecurity    Worry: Not on file    Inability: Not on file  . Transportation needs    Medical: Not on file    Non-medical: Not on file  Tobacco Use  . Smoking status: Never Smoker  . Smokeless tobacco: Never Used   Substance and Sexual Activity  . Alcohol use: No    Alcohol/week: 0.0 standard drinks  . Drug use: No  . Sexual activity: Not on file    Comment: menarche age 63, first live birth age 5, P77, menopause age 49, no HRT  Lifestyle  . Physical activity    Days per week: Not on file    Minutes per session: Not on file  . Stress: Not on file  Relationships  . Social Herbalist on phone: Not on file    Gets together: Not on file    Attends religious service: Not on file    Active member of club or organization: Not on file    Attends meetings of clubs or organizations: Not on file    Relationship status: Not on file  Other Topics Concern  . Not on file  Social History Narrative  . Not on file   Family History  Problem Relation Age of Onset  . Lung cancer Mother        smoker  . Emphysema Father        smoker  . Colon cancer Neg Hx    Scheduled Meds: . chlorhexidine gluconate (MEDLINE KIT)  15 mL Mouth Rinse BID  . Chlorhexidine Gluconate Cloth  6 each Topical Daily  . dorzolamide-timolol  1 drop Both Eyes BID  . insulin aspart  3-9 Units Subcutaneous Q6H  . levothyroxine  12.5 mcg Intravenous Daily  . mouth rinse  15 mL Mouth Rinse 10 times per day  . pantoprazole (PROTONIX) IV  40 mg Intravenous Q24H   Continuous Infusions: .  prismasol BGK 4/2.5 400 mL/hr at 12/14/18 0135  .  prismasol BGK 4/2.5 200 mL/hr at 12/01/2018 0837  . sodium chloride Stopped (12/12/18 1005)  . sodium chloride Stopped (12/11/18 1655)  . anidulafungin 100 mg (12/14/18 1647)  . fentaNYL infusion INTRAVENOUS 50 mcg/hr (12/14/18 1647)  . heparin 1,650 Units/hr (12/14/18 1600)  . norepinephrine (LEVOPHED) Adult infusion 5 mcg/min (12/14/18 1600)  . piperacillin-tazobactam (ZOSYN)  IV Stopped (12/14/18 1348)  . prismasol BGK 4/2.5 1,500 mL/hr at 12/14/18 1650  . TPN ADULT (ION) 75 mL/hr at 12/14/18 1600  . TPN ADULT (ION)     PRN Meds:.sodium chloride, sodium chloride, albuterol,  alteplase, fentaNYL (SUBLIMAZE) injection, heparin, hydrALAZINE, metoprolol tartrate, sodium chloride, sodium chloride flush Medications Prior to Admission:  Prior to Admission medications   Medication Sig Start Date End Date Taking? Authorizing Provider  atorvastatin (LIPITOR) 10 MG tablet Take 10 mg by mouth daily.   Yes [provider]  acetaminophen (TYLENOL) 325 MG tablet Take 650 mg by mouth every 6 (six) hours  as needed for mild pain.    [provider]  Continuous Blood Gluc Receiver (FREESTYLE LIBRE 14 DAY READER) DEVI daily. 08/09/18   [provider]  Continuous Blood Gluc Sensor (FREESTYLE LIBRE 14 DAY SENSOR) MISC Place 1 Device onto the skin every 14 (fourteen) days. 10/25/18   [provider]  glucose blood test strip Use as instructed 11/07/13   Cristal Ford, DO  glucose monitoring kit (FREESTYLE) monitoring kit 1 each by Does not apply route as needed for other. Check blood sugars daily. 11/07/13   Mikhail, Velta Addison, DO  Insulin Glargine (LANTUS SOLOSTAR) 100 UNIT/ML Solostar Pen Inject 10 Units into the skin daily at 10 pm. Patient not taking: Reported on 11/17/2018 03/20/14   Bernadene Bell, MD  levothyroxine (SYNTHROID) 25 MCG tablet Take 25 mcg by mouth daily. 11/03/18   [provider]  lidocaine-prilocaine (EMLA) cream Apply 1 application topically as needed. Patient not taking: Reported on 11/17/2018 04/24/14   Bernadene Bell, MD  LORazepam (ATIVAN) 0.5 MG tablet Take 1 tablet (0.5 mg total) by mouth at bedtime as needed (Nausea or vomiting). Patient not taking: Reported on 12/01/2018 01/09/14   Gardenia Phlegm, NP  metFORMIN (GLUCOPHAGE-XR) 500 MG 24 hr tablet Take 500 mg by mouth 2 (two) times daily with a meal. 10/30/18   [provider]  NOVOLOG MIX 70/30 FLEXPEN (70-30) 100 UNIT/ML FlexPen Inject 20 Units into the muscle 2 (two) times daily with a meal. 10/15/18   [provider]  pravastatin (PRAVACHOL) 10 MG  tablet Take 10 mg by mouth daily.  05/18/15   [provider]   No Known Allergies Review of Systems Unable to assess  Physical Exam General: Intubated and does not respond to verbal or tactile stimulation Heart: Regular rate and rhythm. No murmur appreciated. Lungs: Good air movement  Abdomen: Vac in place.  Ext: No significant edema Skin: Warm and dry  Vital Signs: BP (!) 154/58 (BP Location: Left Leg)   Pulse 78   Temp 98.8 F (37.1 C) (Bladder)   Resp 18   Ht '5\' 4"'  (1.626 m)   Wt 69.5 kg   SpO2 100%   BMI 26.30 kg/m  Pain Scale: CPOT POSS *See Group Information*: S-Acceptable,Sleep, easy to arouse Pain Score: 4    SpO2: SpO2: 100 % O2 Device:SpO2: 100 % O2 Flow Rate: .O2 Flow Rate (L/min): 4 L/min  IO: Intake/output summary:   Intake/Output Summary (Last 24 hours) at 12/14/2018 1748 Last data filed at 12/14/2018 1600 Gross per 24 hour  Intake 2679.31 ml  Output 2724 ml  Net -44.69 ml    LBM: Last BM Date: 12/12/18(smear) Baseline Weight: Weight: 74.8 kg Most recent weight: Weight: 69.5 kg     Palliative Assessment/Data:   Flowsheet Rows     Most Recent Value  Intake Tab  Referral Department  Surgery  Unit at Time of Referral  ICU  Palliative Care Primary Diagnosis  Sepsis/Infectious Disease  Date Notified  12/08/2018  Palliative Care Type  New Palliative care  Reason for referral  Clarify Goals of Care  Date of Admission  12/12/2018  Date first seen by Palliative Care  12/14/18  # of days Palliative referral response time  1 Day(s)  # of days IP prior to Palliative referral  22  Clinical Assessment  Palliative Performance Scale Score  10%  Psychosocial & Spiritual Assessment  Palliative Care Outcomes  Palliative Care Outcomes  Clarified goals of care      Time In:  1720 Time Out: 1840 Time Total: 80 Greater than 50%  of this time was spent counseling and coordinating care related to the above assessment and plan.  Signed by: Micheline Rough, MD   Please contact Palliative Medicine Team phone at [phone] for questions and concerns.  For individual provider: See Shea Evans

## 2018-12-15 LAB — GLUCOSE, CAPILLARY
Glucose-Capillary: 108 mg/dL — ABNORMAL HIGH (ref 70–99)
Glucose-Capillary: 122 mg/dL — ABNORMAL HIGH (ref 70–99)
Glucose-Capillary: 118 mg/dL — ABNORMAL HIGH (ref 70–99)
Glucose-Capillary: 111 mg/dL — ABNORMAL HIGH (ref 70–99)

## 2018-12-15 LAB — CBC
HCT: 24.1 % — ABNORMAL LOW (ref 36.0–46.0)
Hemoglobin: 7.5 g/dL — ABNORMAL LOW (ref 12.0–15.0)
MCH: 30.1 pg (ref 26.0–34.0)
MCHC: 31.1 g/dL (ref 30.0–36.0)
MCV: 96.8 fL (ref 80.0–100.0)
Platelets: 108 10*3/uL — ABNORMAL LOW (ref 150–400)
RBC: 2.49 MIL/uL — ABNORMAL LOW (ref 3.87–5.11)
RDW: 16.2 % — ABNORMAL HIGH (ref 11.5–15.5)
WBC: 19.9 10*3/uL — ABNORMAL HIGH (ref 4.0–10.5)
nRBC: 0 % (ref 0.0–0.2)

## 2018-12-15 LAB — RENAL FUNCTION PANEL
Albumin: 1.3 g/dL — ABNORMAL LOW (ref 3.5–5.0)
Albumin: 1.3 g/dL — ABNORMAL LOW (ref 3.5–5.0)
Anion gap: 9 (ref 5–15)
Anion gap: 12 (ref 5–15)
BUN: 44 mg/dL — ABNORMAL HIGH (ref 8–23)
BUN: 44 mg/dL — ABNORMAL HIGH (ref 8–23)
CO2: 25 mmol/L (ref 22–32)
CO2: 26 mmol/L (ref 22–32)
Calcium: 7.5 mg/dL — ABNORMAL LOW (ref 8.9–10.3)
Calcium: 7.1 mg/dL — ABNORMAL LOW (ref 8.9–10.3)
Chloride: 103 mmol/L (ref 98–111)
Chloride: 100 mmol/L (ref 98–111)
Creatinine, Ser: 1.06 mg/dL — ABNORMAL HIGH (ref 0.44–1.00)
Creatinine, Ser: 1.04 mg/dL — ABNORMAL HIGH (ref 0.44–1.00)
GFR calc Af Amer: >60 mL/min (ref >60)
GFR calc Af Amer: >60 mL/min (ref >60)
GFR calc non Af Amer: 55 mL/min — ABNORMAL LOW (ref >60)
GFR calc non Af Amer: 57 mL/min — ABNORMAL LOW (ref >60)
Glucose, Bld: 113 mg/dL — ABNORMAL HIGH (ref 70–99)
Glucose, Bld: 163 mg/dL — ABNORMAL HIGH (ref 70–99)
Phosphorus: 1.9 mg/dL — ABNORMAL LOW (ref 2.5–4.6)
Phosphorus: 3 mg/dL (ref 2.5–4.6)
Potassium: 4.1 mmol/L (ref 3.5–5.1)
Potassium: 4 mmol/L (ref 3.5–5.1)
Sodium: 137 mmol/L (ref 135–145)
Sodium: 138 mmol/L (ref 135–145)

## 2018-12-15 LAB — CULTURE, BLOOD (ROUTINE X 2)
Culture: NO GROWTH
Culture: NO GROWTH
Special Requests: ADEQUATE
Special Requests: ADEQUATE

## 2018-12-15 LAB — HEPARIN LEVEL (UNFRACTIONATED): Heparin Unfractionated: 0.47 IU/mL (ref 0.30–0.70)

## 2018-12-15 LAB — MAGNESIUM: Magnesium: 2.2 mg/dL (ref 1.7–2.4)

## 2018-12-15 MED ORDER — SODIUM PHOSPHATES 45 MMOLE/15ML IV SOLN
15.0000 mmol | Freq: Once | INTRAVENOUS | Status: AC
  Administered 2018-12-15: 15 mmol via INTRAVENOUS
  Filled 2018-12-15: qty 5

## 2018-12-15 MED ORDER — SODIUM PHOSPHATES 45 MMOLE/15ML IV SOLN: 10.0000 mmol | Freq: Once | INTRAVENOUS | Status: DC

## 2018-12-15 MED ORDER — TRACE MINERALS CR-CU-MN-SE-ZN 10-1000-500-60 MCG/ML IV SOLN
INTRAVENOUS | Status: AC
  Administered 2018-12-15: 17:00:00 via INTRAVENOUS
  Filled 2018-12-15: qty 1320

## 2018-12-15 NOTE — Progress Notes (Addendum)
NAME:  Carmen Stone, MRN:  [id number], DOB:  1956/02/13, LOS: 24 ADMISSION DATE:  11/15/2018, CONSULTATION DATE:  11/21/2018 REFERRING MD:  Dr. Ninfa Linden, Surgery, CHIEF COMPLAINT:  Abdominal pain   Brief History   63 y/o female presented to ER 11/20/2018 with nausea, vomiting, abdominal pain.  CT abd/pelvis showed focal perforated loop of SB with free air with multiple complex ventral hernias.  Underwent emergent laparotomy with SB resection, lysis of adhesions and wound vac.  Remained on vent and pressors post op.  Hospital course complicated by respiratory arrest 6/13, multiple reintubations, intermittent vasopressor needs, AKI on CVVHD.     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 Changing volume goal on CRRT to -100 cc an hour. Weaned 7 hours PSV   6/16 Encephalopathic. Hypertensive, adding labetalol.  IV TNA started 6/17 A little more awake.  Tolerating PSV but SBI exceeds 105.  Volume removal with CVVHD 6/18 Neuro status improved.  Required brief vasoactive drips.  Net negative fluid volume status.PSV 6/19 Extubated 6/20 reintubated for altered mentation, head CT negative  6/21 Tx PRBC x1.  Off vasopressors 6/23 Febrile overnight, Blood Cx. Transfuse 1 U PRBC 6/24 Afebrile. Remains off pressors. 100cc/hr removal /c CRRT. 30% FiO2.  6/25 L IJ, subclavian, axillary  + DVT. Heparin initiated. Remains on CRRT.  6/26  CT ABD/pelvis positive for free air and ascites. Back to OR  for ex lap of perforated viscous, resection of previous anastomosis, application of wound vac.  1U RBCs intra op.   6/28 Back on pressors with hgb <7 / sedated on fent drip, PRBC 6/29 Pending OR for possible closure  7/01 Less sedation, following commands, abd open with VAC, on CVVHD (even)  Consults:  PCCM 6/7 Renal 6/09 AKI, acidosis Cardiology 6/09 Takotsubo CM   Procedures:  ETT 6/07 >> 6/19, 6/20 >> Rt IJ HD 6/09 >> Rt Nixon CVL 6/09 >> Lt radial aline 6/09 >> discontinued  Significant Diagnostic Tests:  CT abd/pelvis 6/07 >> focal perforated loop of SB with free air with multiple complex ventral hernias Echo 6/08 >> EF 20%, Takotsubo CM CT head 6/20 >> negative for any acute findings CT abdomen 6/20 >> bilateral effusions, atelectasis left lower lobe, results noted.    CT Chest 6/20 >> small bilateral pleural effusions with compressive atelectasis, anterior R 4-7 rib fractures CT ABD/Pelvis 6/20 >> small bowel surgical changes, mild circumferential wall thickening of scattered small bowel loops, small to moderate ascites LUE Duplex 6/24 >> DVT L IJ, subclavian, axillary, SVT L cephalic  CT ABD/Pelvis 2/99 >> large volume ascites with intraperitoneal free air   Micro Data:  COVID 6/07 >> negative Tracheal aspirate 6/20 >> E Coli sensitive to zosyn BCx2 6/23 >> negative BCx2  6/26 >> negative   Antimicrobials:  Zosyn 6/07 >> 6/17 Diflucan 6/07 >> 6/10 Cefepime 6/21 >>6/26 Vanco 6/21 >> 6/25 Anidulafungin 6/26 >> Zosyn 6/26 >>  Interim history/subjective:  Afebrile.  RN reports pt even on CVVHD.  On 50 mcg fentanyl gtt, TPN.  Vasopressors off.   Objective   Blood pressure (!) 155/49,  pulse 83, temperature (!) 97 F (36.1 C), resp. rate (!) 21, height 5\' 4"  (1.626 m), weight 69.5 kg, SpO2 100 %.    Vent Mode: PRVC FiO2 (%):  [30 %] 30 % Set Rate:  [18 bmp] 18 bmp Vt Set:  [430 mL] 430 mL PEEP:  [5 cmH20] 5 cmH20 Plateau Pressure:  [9 cmH20-19 cmH20] 17 cmH20   Intake/Output Summary (Last 24 hours) at 12/15/2018 0913 Last data filed at 12/15/2018 0900 Gross per  24 hour  Intake 2810.89 ml  Output 3315 ml  Net -504.11 ml   Filed Weights   12/03/2018 0500 12/11/18 0400 12/14/18 0747  Weight: 72.9 kg 72.2 kg 69.5 kg    Examination: General: chronically ill appearing female lying in bed in NAD HEENT: MM pink/moist, ETT, R IJ HD cath clean / dry / intact  Neuro: opens eyes to voice, nods / interacts appropriately, follows commands, generalized weakness / non-focal, attempts to speak/mouth words CV: s1s2 rrr, no m/r/g PULM: even/non-labored, vent assisted breaths  GI: wound VAC in place, TPN per IV   Extremities: warm/dry, no edema  Skin: no rashes or lesions  Resolved Hospital Problem list   Metabolic alkalosis Abdominal peritonitis with septic shock Circulatory shock, mix of cardiogenic and sepsis off pressors as of 6/15 Cardiorespiratory arrest 6/13 Shock liver Acute metabolic encephalopathy Hypophosphatemia  Assessment & Plan:   Small Bowel Perforation s/p Resection, Lysis of Adhesions -reexploration 6/14, creation of functional end to end small bowel anastomosis and closure of abdominal wound -reexploration 6/26 with pervious anastomosis broken down with holes in same s/p resection of anastomosis with placement of wound vac -6/29 OR findings of pus in the abd, bilious inflammatory rind on intestines & additional perforation P: Post-operative & wound care per CCS Defer surgical prognostication to CCS > at this point, will need to decide with family if they are going to attempt closure + trach vs comfort measures  TPN per pharmacy  Appreciate Palliative Care input   Acute Metabolic Encephalopathy -Previous head CT negative, similar unexplained events during this admission  -Suspect multifactorial in setting of critical illness, infection and protracted course, improved 7/1 P: Follow serial neuro exams Supportive care  Minimize sedating medications   Fever -suspect 2/2 DVT and peritonitis and less likely E-Coli VAP -Has been Tx  for E coli in sputum -Blood Cx remain NGTD however pt had anastomosis broken down with holes in same s/p re-ex lap with resection P: ABX, antifungals as above  Will need [employer] pending goals of care  Limit lines as able but difficult access with bilateral arm restrictions and L IJ DVT  Foley for I/O's with AKI   Anemia -suspect anemia of critical illness, acute blood loss and nutritional   P:  Follow CBC Transfuse per guidelines   Acute Hypoxic Respiratory Failure  -3rd intubation. Unfortunately she has not been in a position to attempt SBT and vent liberation. 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%, Takotsubo Cardiomyopathy P: Volume status per Nephrology  PRN hydralazine  Hold home lisinopril, pravachol  Will need follow up ECHO if patient  improves   Acute kidney injury from tubular necrosis -baseline creatinine of 0.73 from 11/22/2018 Hypophosphatemia  P: Appreciate Nephrology Continue CVVHD, concerned she will not have renal recovery  Trend BMP / urinary output Replace electrolytes as indicated, NaPhos 7/1 Avoid nephrotoxic agents, ensure adequate renal perfusion  Type 2 Diabetes P: SSI  Thrombocytopenia  -secondary to sepsis, medications and consumptive  P: Trend CBC  Monitor for bleeding  No indication for transfusion at this time   Moderate to Severe Protein Calorie Malnutrition  P: TPN per pharmacy   Acute DVT- Left IJ, subclavian, axillary  P: Heparin gtt per pharmacy  Monitor heparin levels, evidence of bleeding   Difficult IV Access -L chest  port accessed  -DVT of L IJ P: IR aware of potential need for perm cath > await surgical prognostication before placement   Hypothyroidism P: Continue synthroid IV   Best practice:  Diet: TPN. DVT prophylaxis: SCDs,  IV hep GI prophylaxis: Protonix Mobility: Bedrest Code Status: Full code Disposition: ICU.  Need clear goals of care d/w family as pt is in MS[facility] and will, at a minimum, require LTACH post hospitalization, possibly including permanent dialysis & tracheostomy.   Family updated at bedside 6/30 on plan of care.  Defer update 7/1 to primary service.   Labs:   CMP Latest Ref Rng & Units 12/15/2018 12/14/2018 12/14/2018  Glucose 70 - 99 mg/dL 113(H) 155(H) 113(H)  BUN 8 - 23 mg/dL 44(H) 42(H) 47(H)  Creatinine 0.44 - 1.00 mg/dL 1.06(H) 1.07(H) 1.15(H)  Sodium 135 - 145 mmol/L 137 135 138  Potassium 3.5 - 5.1 mmol/L 4.1 4.1 4.1  Chloride 98 - 111 mmol/L 103 103 104  CO2 22 - 32 mmol/L 25 26 27   Calcium 8.9 - 10.3 mg/dL 7.5(L) 7.4(L) 7.5(L)  Total Protein 6.5 - 8.1 g/dL - - -  Total Bilirubin 0.3 - 1.2 mg/dL - - -  Alkaline Phos 38 - 126 U/L - - -  AST 15 - 41 U/L - - -  ALT 0 - 44 U/L - - -   CBC Latest Ref Rng & Units 12/15/2018 12/14/2018 12/04/2018  WBC 4.0 - 10.5 K/uL 19.9(H) 23.2(H) 22.8(H)  Hemoglobin 12.0 - 15.0 g/dL 7.5(L) 8.9(L) 9.2(L)  Hematocrit 36.0 - 46.0 % 24.1(L) 28.4(L) 28.5(L)  Platelets 150 - 400 K/uL 108(L) 102(L) 120(L)    CBG (last 3)  Recent Labs    12/14/18 2315 12/14/18 2321 12/15/18 0531  GLUCAP 202* 151* 108*    CC Time: 32 minutes.   [name], NP-C [facility] Pgr: [phone] or if no answer [phone] 12/15/2018, 9:13 AM

## 2018-12-15 NOTE — Progress Notes (Signed)
Daily Progress Note   Patient Name: Carmen Stone       Date: 12/15/2018 DOB: [birth year]  Age: 63 y.o. MRN#: [id number] Attending Physician: Nolon Nations, MD Primary Care Physician: Jettie Booze, NP Admit Date: 12/11/2018  Reason for Consultation/Follow-up: Establishing goals of care  Subjective: Met today with Iona Beard at the bedside.  He reports understanding what everyone is telling him regarding his sister and poor prognosis, but he reports "needing a little more time to process."  Ms. Horiuchi is more alert today and he reports that "makes things even harder."  Briefly reviewed clinical course, and brother denied any further questions today.  Length of Stay: 24  Current Medications: Scheduled Meds:  . chlorhexidine gluconate (MEDLINE KIT)  15 mL Mouth Rinse BID  . Chlorhexidine Gluconate Cloth  6 each Topical Daily  . dorzolamide-timolol  1 drop Both Eyes BID  . insulin aspart  3-9 Units Subcutaneous Q6H  . levothyroxine  12.5 mcg Intravenous Daily  . mouth rinse  15 mL Mouth Rinse 10 times per day  . pantoprazole (PROTONIX) IV  40 mg Intravenous Q24H    Continuous Infusions: .  prismasol BGK 4/2.5 400 mL/hr at 12/15/18 1407  .  prismasol BGK 4/2.5 200 mL/hr at 12/15/18 1606  . sodium chloride Stopped (12/12/18 1005)  . sodium chloride Stopped (12/11/18 1655)  . anidulafungin Stopped (12/15/18 1730)  . fentaNYL infusion INTRAVENOUS 50 mcg/hr (12/15/18 2000)  . heparin 1,650 Units/hr (12/15/18 2000)  . norepinephrine (LEVOPHED) Adult infusion Stopped (12/15/18 0400)  . piperacillin-tazobactam (ZOSYN)  IV Stopped (12/15/18 1804)  . prismasol BGK 4/2.5 1,500 mL/hr at 12/15/18 2000  . TPN ADULT (ION) 75 mL/hr at 12/15/18 2000    PRN Meds: sodium chloride, sodium chloride,  albuterol, alteplase, fentaNYL (SUBLIMAZE) injection, heparin, hydrALAZINE, metoprolol tartrate, sodium chloride, sodium chloride flush  Physical Exam         General: Intubated more awake today Heart: Regular rate and rhythm. No murmur appreciated. Lungs: Good air movement  Abdomen: Vac in place.  Ext: No significant edema Skin: Warm and dry  Vital Signs: BP (!) 116/41   Pulse 83   Temp 98.2 F (36.8 C)   Resp 17   Ht 5' 4" (1.626 m)   Wt 69.5 kg   SpO2 100%   BMI 26.30 kg/m  SpO2: SpO2: 100 % O2 Device: O2 Device: Ventilator O2 Flow Rate: O2 Flow Rate (L/min): 4 L/min  Intake/output summary:   Intake/Output Summary (Last 24 hours) at 12/15/2018 2033 Last data filed at 12/15/2018 2000 Gross per 24 hour  Intake 2845.53 ml  Output 3604 ml  Net -758.47 ml   LBM: Last BM Date: 12/12/18 Baseline Weight: Weight: 74.8 kg Most recent weight: Weight: 69.5 kg       Palliative Assessment/Data:    Flowsheet Rows     Most Recent Value  Intake Tab  Referral Department  Surgery  Unit at Time of Referral  ICU  Palliative Care Primary Diagnosis  Sepsis/Infectious Disease  Date Notified  11/22/2018  Palliative Care Type  New Palliative care  Reason for referral  Clarify Goals of Care  Date of Admission  11/26/2018  Date first seen by Palliative Care  12/14/18  # of days Palliative referral response time  1 Day(s)  # of days IP prior to Palliative referral  22  Clinical Assessment  Palliative Performance Scale Score  10%  Psychosocial & Spiritual Assessment  Palliative Care Outcomes  Palliative Care Outcomes  Clarified goals of care      Patient Active Problem List   Diagnosis Date Noted  . Acute respiratory failure with hypoxia and hypercapnia ([place]) 12/12/2018  . Toxic metabolic encephalopathy 58/85/0277  . Thrombocytopenia, acquired ([place]) 12/12/2018  . Small bowel perforation ([place])   . Pressure injury of skin 11/29/2018  . Encounter for central line placement   .  Septic shock ([place])   . AKI (acute kidney injury) ([place])   . S/P dialysis catheter insertion ([place])   . 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 Perforated viscus 12/06/2018  . Port-A-Cath in place 08/03/2017  . Port catheter in place 01/31/2016  . Hypersensitivity reaction 02/08/2014  . Drug induced neutropenia(288.03) 01/30/2014  . Nausea with vomiting 11/04/2013  . 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 ATN (acute tubular necrosis) 11/02/2013  . Acute respiratory failure ([place]) 11/02/2013  . Acute respiratory failure with hypoxia ([place]) 10/25/2013  . Abnormal urinalysis 10/25/2013  . Postoperative anemia due to acute blood loss 10/24/2013  . Breast abscess of female 10/22/2013  . HYPERLIPIDEMIA 07/16/2010  . ATELECTASIS 07/16/2010  . Type 2 diabetes mellitus ([place]) 06/24/2010  . 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 HYPOXEMIA 06/24/2010  . SMALL BOWEL OBSTRUCTION, HX OF 06/24/2010    Palliative Care Assessment & Plan   Patient Profile: 63 y.o. female  with past medical history of  admitted on 11/24/2018 with abdominal pain from perforation and complicated clinical course with multiple surgeries requiring resection of large portion of small bowel, code blue, and multisystem organ failure (EF 20%, on CRRT and vent dependent).     Assessment: Patient Active Problem List   Diagnosis Date Noted  . Acute respiratory failure with hypoxia and hypercapnia ([place]) 12/12/2018  . Toxic metabolic encephalopathy 41/28/7867  . Thrombocytopenia, acquired ([place]) 12/12/2018  . Small bowel perforation ([place])   . Pressure injury of skin 11/29/2018  . Encounter for central line placement   . Septic shock ([place])   . AKI (acute kidney injury) ([place])   . S/P dialysis catheter insertion ([place])   . 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 Perforated viscus 12/05/2018  . Port-A-Cath in place 08/03/2017  . Port catheter  in place 01/31/2016  . Hypersensitivity reaction 02/08/2014  . Drug induced neutropenia(288.03) 01/30/2014  . Nausea with  vomiting 11/04/2013  . 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 ATN (acute tubular necrosis) 11/02/2013  . Acute respiratory failure ([place]) 11/02/2013  . Acute respiratory failure with hypoxia ([place]) 10/25/2013  . Abnormal urinalysis 10/25/2013  . Postoperative anemia due to acute blood loss 10/24/2013  . Breast abscess of female 10/22/2013  . HYPERLIPIDEMIA 07/16/2010  . ATELECTASIS 07/16/2010  . Type 2 diabetes mellitus ([place]) 06/24/2010  . 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 HYPOXEMIA 06/24/2010  . SMALL BOWEL OBSTRUCTION, HX OF 06/24/2010    Recommendations/Plan:  Discussed again with patient's brother/HCPOA regarding patient's clinical course and poor prognosis.  He denies questions and reports needing time to process as family.  He continues to progress in his acceptance of her poor prognosis per questions and conversations with bedside staff today.  Continue conversation daily.   Code Status:    Code Status Orders  (From admission, onward)         Start     Ordered   11/22/2018 1515  Full code  Continuous     12/06/2018 1515        Code Status History    Date Active Date Inactive Code Status Order ID Comments User Context   11/18/2018 1512 11/16/2018 1515 Full Code [id number]  Chesley Mires, MD Inpatient   Advance Care Planning Activity       Prognosis:   Poor  Discharge Planning:  Likely hospital death  Care plan was discussed with brother, bedside RN  Thank you for allowing the Palliative Medicine Team to assist in the care of this patient.   Time In: 1600 Time Out: 1630 Total Time 30 Prolonged Time Billed No      Greater than 50%  of this time was spent counseling and coordinating care related to the above assessment and plan.  Micheline Rough, MD  Please contact Palliative Medicine Team phone at [phone] for questions and concerns.

## 2018-12-15 NOTE — Progress Notes (Signed)
ANTICOAGULATION CONSULT NOTE - Follow Up Consult  Pharmacy Consult for heparin Indication: IJ DVT  No Known Allergies  Patient Measurements: Height: 5\' 4"  (162.6 cm) Weight: 153 lb 3.5 oz (69.5 kg) IBW/kg (Calculated) : 54.7 Heparin Dosing Weight:   Vital Signs: Temp: 98.1 F (36.7 C) (07/01 0600) BP: 154/52 (07/01 0600) Pulse Rate: 83 (07/01 0329)  Labs: Recent Labs    12/06/2018 0500  12/14/18 0034 12/14/18 0551 12/14/18 1600 12/15/18 0522  HGB 9.2*  --   --  8.9*  --  7.5*  HCT 28.5*  --   --  28.4*  --  24.1*  PLT 120*  --   --  102*  --  108*  HEPARINUNFRC <0.10*  --  0.40 0.42  --  0.47  CREATININE 1.51*  1.14*   < >  --  1.15* 1.07* 1.06*   < > = values in this interval not displayed.    Estimated Creatinine Clearance: 51.3 mL/min (A) (by C-G formula based on SCr of 1.06 mg/dL (H)).   Medications:  Infusions:  .  prismasol BGK 4/2.5 400 mL/hr at 12/15/18 0130  .  prismasol BGK 4/2.5 200 mL/hr at 12/12/2018 0837  . sodium chloride Stopped (12/12/18 1005)  . sodium chloride Stopped (12/11/18 1655)  . anidulafungin Stopped (12/14/18 1825)  . fentaNYL infusion INTRAVENOUS 50 mcg/hr (12/15/18 0600)  . heparin 1,650 Units/hr (12/15/18 0600)  . norepinephrine (LEVOPHED) Adult infusion Stopped (12/15/18 0400)  . piperacillin-tazobactam (ZOSYN)  IV 3.375 g (12/15/18 0532)  . prismasol BGK 4/2.5 1,500 mL/hr at 12/15/18 0240  . TPN ADULT (ION) 75 mL/hr at 12/15/18 0600    Assessment: Patient with heparin level at goal again.  No heparin issues noted.  Goal of Therapy:  Heparin level 0.3-0.7 units/ml Monitor platelets by anticoagulation protocol: Yes   Plan:  Continue heparin drip at current rate Recheck level with AM labs  Tyler Deis, Shea Stakes [place] 12/15/2018,6:50 AM

## 2018-12-15 NOTE — Progress Notes (Addendum)
2 Days Post-Op    WN:[id number] pain  Subjective: Patient's awake and looking around this morning.  She is aware her brother is present.  And is responsive this a.m.  No urine output.  Appears comfortable on the ventilator.  Objective: Vital signs in last 24 hours: Temp:  [96.1 F (35.6 C)-99.1 F (37.3 C)] 98.1 F (36.7 C) (07/01 0600) Pulse Rate:  [78-92] 83 (07/01 0329) Resp:  [13-28] 20 (07/01 0600) BP: (89-154)/(43-61) 154/52 (07/01 0600) SpO2:  [100 %] 100 % (07/01 0329) FiO2 (%):  [30 %] 30 % (07/01 0329) Weight:  [69.5 kg] 69.5 kg (06/30 0747) Last BM Date: 12/12/18(smear) 2733 IV 46 urine 415 NG 505 drain Afebile, VSS currently off pressors Vent PRVC rate 18 FiO2 30% Creatinine 1.06 WBC 19.9 CXR 6/30: Mild right base infiltrate noted on today's exam. Small right pleural effusion. Intake/Output from previous day: 06/30 0701 - 07/01 0700 In: 2733.1 [I.V.:2429; IV Piggyback:304.1] Out: 3197 [Urine:46; Emesis/NG output:415; Drains:505] Intake/Output this shift: Total I/O In: 1133.2 [I.V.:1033.2; IV Piggyback:100] Out: 1838 [Urine:46; Emesis/NG output:365; Drains:245; XFGHW:2993]  General appearance: alert, cooperative, no distress and Much more awake and alert this a.m. Resp: Clear anterior on vent GI: Wound VAC in place few bowel sounds.  Lab Results:  Recent Labs    12/14/18 0551 12/15/18 0522  WBC 23.2* 19.9*  HGB 8.9* 7.5*  HCT 28.4* 24.1*  PLT 102* 108*    BMET Recent Labs    12/14/18 1600 12/15/18 0522  NA 135 137  K 4.1 4.1  CL 103 103  CO2 26 25  GLUCOSE 155* 113*  BUN 42* 44*  CREATININE 1.07* 1.06*  CALCIUM 7.4* 7.5*   PT/INR No results for input(s): LABPROT, INR in the last 72 hours.  Recent Labs  Lab 12/09/18 0455  11/24/2018 0500 12/11/2018 1600 12/14/18 0551 12/14/18 1600 12/15/18 0522  AST 28  --  30  --   --   --   --   ALT 39  --  27  --   --   --   --   ALKPHOS 173*  --  217*  --   --   --   --   BILITOT 0.9  --  1.0   --   --   --   --   PROT 7.0  --  5.7*  --   --   --   --   ALBUMIN 1.7*   < > 1.5*  1.4* 1.3* 1.3* 1.4* 1.3*   < > = values in this interval not displayed.     Lipase     Component Value Date/Time   LIPASE 24 12/02/2018 0355     Medications: . chlorhexidine gluconate (MEDLINE KIT)  15 mL Mouth Rinse BID  . Chlorhexidine Gluconate Cloth  6 each Topical Daily  . dorzolamide-timolol  1 drop Both Eyes BID  . insulin aspart  3-9 Units Subcutaneous Q6H  . levothyroxine  12.5 mcg Intravenous Daily  . mouth rinse  15 mL Mouth Rinse 10 times per day  . pantoprazole (PROTONIX) IV  40 mg Intravenous Q24H   .  prismasol BGK 4/2.5 400 mL/hr at 12/15/18 0130  .  prismasol BGK 4/2.5 200 mL/hr at 11/24/2018 0837  . sodium chloride Stopped (12/12/18 1005)  . sodium chloride Stopped (12/11/18 1655)  . anidulafungin Stopped (12/14/18 1825)  . fentaNYL infusion INTRAVENOUS 50 mcg/hr (12/15/18 0600)  . heparin 1,650 Units/hr (12/15/18 0600)  . norepinephrine (LEVOPHED) Adult infusion Stopped (12/15/18  0400)  . piperacillin-tazobactam (ZOSYN)  IV 3.375 g (12/15/18 0532)  . prismasol BGK 4/2.5 1,500 mL/hr at 12/15/18 0240  . TPN ADULT (ION) 75 mL/hr at 12/15/18 0600    Assessment/Plan Sepsis Ongoing hypotension-low-dose Levophed Acute kidney injury/tubular necrosis-CRRT (1.06 creatinine) Acute respiratory arrest 12/12/2018 Acute hypoxic respiratory failure/atelectasis/pleural effusion: Reintubated 4/96/11 Acute metabolic encephalopathy-head CT 12/04/2018: No acute abnormality Takatsubo's cardiomyopathy: EF ~ 20% Anemia transfused 12/05/2018 DVT left IJ 6/24-Heparin drip Thrombocytopenia Severe malnutrition-prealbumin 6.8(6/29) >>5.8(6/30 Leukocytosis 17.6(6/27) >>20.1(6/28) >>22.8(6/29) >>23.2(6/30)>>19.9(7/1) Hypothyroid Full Code - ongoing full support  Perforated small bowel with closed-loop obstruction 1.Exploratory laparotomy small bowel resection, lysis of adhesions, 1.5  hours, placement of wound VAC 12/12/2018 Dr. Coralie Keens, POD #24 2. Exploratory laparotomy, washout and placement of wound VAC 11/24/2018 Dr. Erroll Luna, POD #20 3. Exploratory laparotomy with anastomosis of small bowel and closure of abdominal wall 12/12/2018 Dr. Marcello Moores [place]ett POD#17 4.Exploratory laparotomy, resection of previous anastomosis, wound VAC placement 11/16/2018 Dr. Autumn Messing POD #5 5. Reopening of recent laparotomy, small bowel resection, placement of wound VAC 11/17/2018, Dr. Stark Klein  POD#2 (FINDINGS:Pus throughout abdomen, though minimal succus. Bilious rind over intestines. Small hole on distal segment of small bowel with small amount of bile)  FEN: N.p.o./TPN ID: Maxipime 6/21 -6/25; Eraxis 6/26>>day 6; Zosyn 6/26>>day 6 DVT: Heparin drip Follow-up: To be determined POC: Mattier,George Brother [phone]  [phone]  Matier,Vickie    [phone]    Plan: Patient is awake and alert.  Family has met with Palliative Medicine Team.  Currently the patient remains full code and full support.  Hemodynamically stable currently, off pressors, ongoing renal failure.     LOS: 24 days    Sorrel Cassetta 12/15/[phone]

## 2018-12-15 NOTE — Progress Notes (Signed)
PHARMACY - ADULT TOTAL PARENTERAL NUTRITION CONSULT NOTE   Pharmacy Consult for TPN Indication: prolonged ileus  HPI: 53 yoF admitted 6/7 with perforated small bowel from closed-loop obstruction and underwent emergent exploratory laparotomy. Returned to OR on 6/9 and again on 6/14 for closure of abdominal wall.  Patient coded on 12/07/22 and developed multiorgan failure. Remains intubated & on CRRT. Pressors have been weaned off and she is completing antibiotic course for sepsis/PNA.  NPO since admission on 6/7. Pharmacy consulted to start TPN 6/16. D/t acute liver injury from shock, initiated TPN at low rate & advance slowly as requested by surgery.  Patient Measurements: Height: '5\' 4"'  (162.6 cm) Weight: 153 lb 3.5 oz (69.5 kg) IBW/kg (Calculated) : 54.7 TPN AdjBW (KG): 59.7 Body mass index is 26.3 kg/m. Usual Weight: 90kg   Intake/Output Summary (Last 24 hours) at 12/15/2018 0805 Last data filed at 12/15/2018 0800 Gross per 24 hour  Intake 2820.25 ml  Output 3308 ml  Net -487.75 ml    Recent Labs    11/17/2018 0500  12/14/18 0551 12/14/18 1600 12/15/18 0522  NA 136  137   < > 138 135 137  K 4.2  3.7   < > 4.1 4.1 4.1  CL 103  104   < > 104 103 103  CO2 25  25   < > '27 26 25  ' GLUCOSE 119*  92   < > 113* 155* 113*  BUN 55*  45*   < > 47* 42* 44*  CREATININE 1.51*  1.14*   < > 1.15* 1.07* 1.06*  CALCIUM 7.6*  7.3*   < > 7.5* 7.4* 7.5*  PHOS 3.5   < > 2.5  2.5 1.9* 1.9*  MG 2.3  --  2.1  --  2.2  ALBUMIN 1.5*  1.4*   < > 1.3* 1.4* 1.3*  ALKPHOS 217*  --   --   --   --   AST 30  --   --   --   --   ALT 27  --   --   --   --   BILITOT 1.0  --   --   --   --   TRIG 108  --  100  --   --   PREALBUMIN 6.8*  --  5.8*  --   --    < > = values in this interval not displayed.    Significant events:  6/18: OK to increase to goal rate per surgery 6/22: CRRT stopped for holiday; CBG 69 overnight, D50 given but TPN continued (55 units insulin in TPN); BS and BM overnight -  initiating trickle feeds 6/23: resuming CRRT, return to high-protein TPN; fevers overnight, CCM removing some central lines; will use chemo port for TPN 6/24: began adding lipids to TPN formula 6/25: trickle feeds stopped d/t abd distention 6/26: CRRT clotted off overnight; patient to OR this AM for ex lap; RN attempting to restart CRRT post-op but still having issues at this time 6/27: CRRT appears to have been successfully resumed overnight   Central access: 12/08/2018 TPN start date: 11/30/18  ASSESSMENT  Current Nutrition: NPO IVF: On CRRT to keep net neutral I/O  Today:   Glucose (goal 100-150) - Hx DM on 70/30 Novolog 20 units BID PTA.  CBGs acceptable  25 units insulin in TPN + ICU resistant scale; 12 units SSI required yesterday  Electrolytes - on CRRT; WNL x phos low at 1.9  Renal - AKI; SCr, bicarb normalized on CRRT, BUN remains elevated  LFTs - albumin remains low; alk phos 217  TGs - WNL (6/30)  Prealbumin - down to 5.8 6/30, reflects inflammation and critical illness,  NUTRITIONAL GOALS                                                                                             RD recs: (6/29):  Kcal:1514 kcal, Protein:195-215 grams Fluid:>/= 1.8 L/day  PLAN                                                                                                                         Now: [place] 15 mMol At 1800 today:  Continue TPN at 75 ml/hr  custom TPN at goal rate of 75 ml/hr provides: 198 g/day protein, 1512 Kcal/day = 100% support  Electrolytes: continue to hold Mag, small K, increased Na, decr Phos; Cl:Ac = 1:2  TPN to contain standard multivitamins, trace elements MWF only due to national backorder.  IVF per MD  Continue ICU resistant-scale glycemic control orders q6h; continue 25 units regular insulin in TPN while TFs held  TPN lab panels Mondays  & Thursdays  Ordered BID renal function panels while on CRRT  Eudelia Bunch, Pharm.D 12/15/2018 8:05 AM

## 2018-12-15 NOTE — Progress Notes (Signed)
[facility] ROUNDING NOTE   Subjective:   This is a 63 year old lady with acute kidney injury last baseline 0.79 06/04/2018.  She had acute oliguric acute tubular necrosis in the setting of septic shock from bowel perforation.  Was on CRRT from 6/ 9  to  6/12 was held after cardiac arrest.  She resumed 11/15/2018 after abdominal closure.  Still no signs of renal recovery.  Status post small bowel perforation due to closed-loop SBO and septic shock followed by CCM underwent closure 12/12/2018 new recent bowel leak due to nonhealing anastomosis.  Went back to the OR 11/22/2018 for resection of anastomosis.  She went back to the OR 11/16/2018.  She has a large wound VAC.  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  No history of diabetes mellitus type 2.  No recorded urine output.  Has been kept even on CRRT  Sodium 137 potassium 4.1 chloride 103 CO2 25 BUN 44 creatinine 1.06 glucose 113 calcium 7.9 phosphorus 1.9 albumin 1.3 magnesium 2.2 WBC 19.9 hemoglobin 7.5 platelets 108  IV Levophed IV heparin IV TPN  IV Zosyn   CT scan abdomen 12/09/2018 shows large amount of intraperitoneal free fluid which is progressed to large volumes ascites.  Prominent intraperitoneal free air collection under the anterior abdominal wall with gas scattered in the central small bowel mesentery.  Patchy airspace disease.  Levothyroxine 12.5 mcg IV daily Protonix 40 mg IV every 24 hours   Objective:  Vital signs in last 24 hours:  Temp:  [96.6 F (35.9 C)-99.1 F (37.3 C)] 96.6 F (35.9 C) (07/01 1117) Pulse Rate:  [78-83] 83 (07/01 0329) Resp:  [13-21] 20 (07/01 1117) BP: (99-155)/(38-61) 120/41 (07/01 1117) SpO2:  [100 %] 100 % (07/01 1139) FiO2 (%):  [30 %] 30 % (07/01 1139)  Weight change:  Filed Weights   11/18/2018 0500 12/11/18 0400 12/14/18 0747  Weight: 72.9 kg 72.2 kg 69.5 kg    Intake/Output: I/O last 3 completed shifts: In: 4268.3 [I.V.:3820.4;  IV Piggyback:447.8] Out: 3474 [Urine:73; Emesis/NG output:600; Drains:700; Other:3481]   Intake/Output this shift:  Total I/O In: 446.4 [I.V.:366.1; IV Piggyback:80.3] Out: 574 [Drains:105; Other:469] Intubated and sedated making grimacing faces. CVS- RRR no JVP RS- CTA intubated with rhonchorous breath sounds ABD- BS present soft non-distended EXT-1-2+ lower extremity edema   Basic Metabolic Panel: Recent Labs  Lab 12/11/18 0409  12/12/18 0545  11/29/2018 0500 12/02/2018 1600 12/14/18 0551 12/14/18 1600 12/15/18 0522  NA 136   < > 135   < > 136  137 134* 138 135 137  K 4.5   < > 4.0   < > 4.2  3.7 4.0 4.1 4.1 4.1  CL 102   < > 102   < > 103  104 103 104 103 103  CO2 23   < > 25   < > _0 GLUCOSE 124*   < > 115*   < > 119*  92 142* 113* 155* 113*  BUN 86*   < > 58*   < > 55*  45* 47* 47* 42* 44*  CREATININE 1.94*   < > 1.36*   < > 1.51*  1.14* 1.22* 1.15* 1.07* 1.06*  CALCIUM 7.4*   < > 7.5*   < > 7.6*  7.3* 6.7* 7.5* 7.4* 7.5*  MG 2.2  --  2.2  --  2.3  --  2.1  --  2.2  PHOS 3.6   < >  2.0*   < > 3.5 3.2 2.5  2.5 1.9* 1.9*   < > = values in this interval not displayed.    Liver Function Tests: Recent Labs  Lab 12/09/18 0455  12/07/2018 0500 11/22/2018 1600 12/14/18 0551 12/14/18 1600 12/15/18 0522  AST 28  --  30  --   --   --   --   ALT 39  --  27  --   --   --   --   ALKPHOS 173*  --  217*  --   --   --   --   BILITOT 0.9  --  1.0  --   --   --   --   PROT 7.0  --  5.7*  --   --   --   --   ALBUMIN 1.7*   < > 1.5*  1.4* 1.3* 1.3* 1.4* 1.3*   < > = values in this interval not displayed.   No results for input(s): LIPASE, AMYLASE in the last 168 hours. No results for input(s): AMMONIA in the last 168 hours.  CBC: Recent Labs  Lab 12/11/18 0409 12/12/18 0545 12/12/18 1832 12/05/2018 0500 12/14/18 0551 12/15/18 0522  WBC 17.6* 20.1*  --  22.8* 23.2* 19.9*  NEUTROABS  --   --   --  17.3*  --   --   HGB 7.1* 6.6* 9.7* 9.2* 8.9* 7.5*   HCT 21.3* 21.1* 30.0* 28.5* 28.4* 24.1*  MCV 93.4 94.6  --  92.8 97.6 96.8  PLT 121* 128*  --  120* 102* 108*    Cardiac Enzymes: No results for input(s): CKTOTAL, CKMB, CKMBINDEX, TROPONINI in the last 168 hours.  BNP: Invalid input(s): POCBNP  CBG: Recent Labs  Lab 12/14/18 2311 12/14/18 2315 12/14/18 2321 12/15/18 0531 12/15/18 1149  GLUCAP 146* 202* 151* 108* 122*    Microbiology: Results for orders placed or performed during the hospital encounter of 11/17/2018  SARS Coronavirus 2 (CEPHEID - Performed in [facility] hospital lab), Hosp Order     Status: None   Collection Time: 12/07/2018  3:55 AM   Specimen: Nasopharyngeal Swab  Result Value Ref Range Status   SARS Coronavirus 2 NEGATIVE NEGATIVE Final    Comment: (NOTE) If result is NEGATIVE SARS-CoV-2 target nucleic acids are NOT DETECTED. The SARS-CoV-2 RNA is generally detectable in upper and lower  respiratory specimens during the acute phase of infection. The lowest  concentration of SARS-CoV-2 viral copies this assay can detect is 250  copies / mL. A negative result does not preclude SARS-CoV-2 infection  and should not be used as the sole basis for treatment or other  patient management decisions.  A negative result may occur with  improper specimen collection / handling, submission of specimen other  than nasopharyngeal swab, presence of viral mutation(s) within the  areas targeted by this assay, and inadequate number of viral copies  (<250 copies / mL). A negative result must be combined with clinical  observations, patient history, and epidemiological information. If result is POSITIVE SARS-CoV-2 target nucleic acids are DETECTED. The SARS-CoV-2 RNA is generally detectable in upper and lower  respiratory specimens dur ing the acute phase of infection.  Positive  results are indicative of active infection with SARS-CoV-2.  Clinical  correlation with patient history and other diagnostic information is   necessary to determine patient infection status.  Positive results do  not rule out bacterial infection or co-infection with other viruses. If result is PRESUMPTIVE POSTIVE  SARS-CoV-2 nucleic acids MAY BE PRESENT.   A presumptive positive result was obtained on the submitted specimen  and confirmed on repeat testing.  While 2019 novel coronavirus  (SARS-CoV-2) nucleic acids may be present in the submitted sample  additional confirmatory testing may be necessary for epidemiological  and / or clinical management purposes  to differentiate between  SARS-CoV-2 and other Sarbecovirus currently known to infect humans.  If clinically indicated additional testing with an alternate test  methodology [phone]) is advised. The SARS-CoV-2 RNA is generally  detectable in upper and lower respiratory sp ecimens during the acute  phase of infection. The expected result is Negative. Fact Sheet for Patients:  StrictlyIdeas.no Fact Sheet for Healthcare Providers: BankingDealers.co.za This test is not yet approved or cleared by the [place] FDA and has been authorized for detection and/or diagnosis of SARS-CoV-2 by FDA under an Emergency Use Authorization (EUA).  This EUA will remain in effect (meaning this test can be used) for the duration of the COVID-19 declaration under Section 564(b)(1) of the Act, 21 U.S.C. section 360bbb-3(b)(1), unless the authorization is terminated or revoked sooner. Performed at [facility], [place] [address], [place], [place] [address]   MRSA PCR Screening     Status: None   Collection Time: 11/22/18 10:14 AM   Specimen: Nasal Mucosa; Nasopharyngeal  Result Value Ref Range Status   MRSA by PCR NEGATIVE NEGATIVE Final    Comment:        The GeneXpert MRSA Assay (FDA approved for NASAL specimens only), is one component of a comprehensive MRSA colonization surveillance program. It is not intended to  diagnose MRSA infection nor to guide or monitor treatment for MRSA infections. Performed at [facility], [place] [address] [place], [place] [address]   Culture, respiratory (non-expectorated)     Status: None   Collection Time: 12/05/18 11:25 AM   Specimen: Tracheal Aspirate; Respiratory  Result Value Ref Range Status   Specimen Description   Final    TRACHEAL ASPIRATE Performed at [place] [address] [place], [place] [address]    Special Requests   Final    NONE Performed at [facility], [place] [address], [place], [place] [address]    Gram Stain   Final    RARE WBC PRESENT, PREDOMINANTLY PMN NO ORGANISMS SEEN Performed at [facility], [place] [address], [place], [place] [address]    Culture RARE ESCHERICHIA COLI  Final   Report Status 12/07/2018 FINAL  Final   Organism ID, Bacteria ESCHERICHIA COLI  Final      Susceptibility   Escherichia coli - MIC*    AMPICILLIN >=32 RESISTANT Resistant     CEFAZOLIN <=4 SENSITIVE Sensitive     CEFEPIME <=1 SENSITIVE Sensitive     CEFTAZIDIME <=1 SENSITIVE Sensitive     CEFTRIAXONE <=1 SENSITIVE Sensitive     CIPROFLOXACIN <=0.25 SENSITIVE Sensitive     GENTAMICIN <=1 SENSITIVE Sensitive     IMIPENEM <=0.25 SENSITIVE Sensitive     TRIMETH/SULFA <=20 SENSITIVE Sensitive     AMPICILLIN/SULBACTAM >=32 RESISTANT Resistant     PIP/TAZO <=4 SENSITIVE Sensitive     Extended ESBL NEGATIVE Sensitive     * RARE ESCHERICHIA COLI  Culture, blood (routine x 2)     Status: None   Collection Time: 12/07/18 10:50 AM   Specimen: BLOOD LEFT ARM  Result Value Ref Range Status   Specimen Description   Final    BLOOD  LEFT ARM Performed at [facility], [place] [address], [place], [place] [address]    Special Requests   Final    BOTTLES DRAWN AEROBIC AND ANAEROBIC Blood Culture adequate volume Performed at [place] [address] [place], [place]  [address]    Culture   Final    NO GROWTH 5 DAYS Performed at [facility], [place] [address], [place], [place] [address]    Report Status 12/12/2018 FINAL  Final  Culture, blood (routine x 2)     Status: None   Collection Time: 11/30/2018 12:24 PM   Specimen: BLOOD  Result Value Ref Range Status   Specimen Description   Final    BLOOD RIGHT ARM Performed at [place] [address], [place], [place] [address]    Special Requests   Final    BOTTLES DRAWN AEROBIC AND ANAEROBIC Blood Culture adequate volume Performed at [place] [address] [place], [place] [address]    Culture   Final    NO GROWTH 5 DAYS Performed at [facility], [place] [address], [place], [place] [address]    Report Status 12/15/2018 FINAL  Final  Culture, blood (routine x 2)     Status: None   Collection Time: 12/06/2018  1:03 PM   Specimen: BLOOD  Result Value Ref Range Status   Specimen Description   Final    BLOOD RIGHT ANTECUBITAL Performed at [place] [address] [place], [place] [address]    Special Requests   Final    BOTTLES DRAWN AEROBIC ONLY Blood Culture adequate volume Performed at [place] [address] [place], [place] [address]    Culture   Final    NO GROWTH 5 DAYS Performed at [facility], [place] [address] [place],  [address]    Report Status 12/15/2018 FINAL  Final    Coagulation Studies: No results for input(s): LABPROT, INR in the last 72 hours.  Urinalysis: No results for input(s): COLORURINE, LABSPEC, PHURINE, GLUCOSEU, HGBUR, BILIRUBINUR, KETONESUR, PROTEINUR, UROBILINOGEN, NITRITE, LEUKOCYTESUR in the last 72 hours.  Invalid input(s): APPERANCEUR    Imaging: Dg Chest Port 1 View  Result Date: 12/14/2018 CLINICAL DATA:  Respiratory failure. EXAM: PORTABLE CHEST 1 VIEW COMPARISON:  11/29/2018. FINDINGS: Endotracheal tube NG tube, NG tube, left PowerPort  catheter, right IJ line, right subclavian line in stable position. Heart size stable. Mild right base infiltrate noted on today's exam. Small right pleural effusion. IMPRESSION: 1.  Lines and tubes in stable position. 2. Mild right base infiltrate noted on today's exam. Small right pleural effusion. Electronically Signed   By: Marcello Moores  Register   On: 12/14/2018 06:18   Dg Chest Port 1 View  Result Date: 12/08/2018 CLINICAL DATA:  Central line complication EXAM: PORTABLE CHEST 1 VIEW COMPARISON:  12/11/2018 FINDINGS: Endotracheal tube has been advanced, and is now positioned with tip just above the carina. Otherwise no change in AP portable examination with support apparatus including right subclavian vascular catheter, right neck large bore vascular catheter or sheath, left subclavian port catheter, and esophagogastric tube with tip and side port below the diaphragm. The heart and mediastinum are unremarkable. No acute airspace opacity. IMPRESSION: 1. Endotracheal tube has been advanced, and is now positioned with tip just above the carina. Consider slight retraction. 2. Otherwise no change in AP portable examination with support apparatus including right subclavian vascular catheter, right neck large bore  vascular catheter or sheath, left subclavian port catheter, and esophagogastric tube with tip and side port below the diaphragm. The heart and mediastinum are unremarkable. No acute airspace opacity. Electronically Signed   By: Eddie Candle M.D.   On: 12/03/2018 19:43     Medications:   .  prismasol BGK 4/2.5 400 mL/hr at 12/15/18 0130  .  prismasol BGK 4/2.5 200 mL/hr at 12/02/2018 0837  . sodium chloride Stopped (12/12/18 1005)  . sodium chloride Stopped (12/11/18 1655)  . anidulafungin Stopped (12/14/18 1825)  . fentaNYL infusion INTRAVENOUS 50 mcg/hr (12/15/18 0700)  . heparin 1,650 Units/hr (12/15/18 1100)  . norepinephrine (LEVOPHED) Adult infusion Stopped (12/15/18 0400)  .  piperacillin-tazobactam (ZOSYN)  IV 3.375 g (12/15/18 0532)  . prismasol BGK 4/2.5 1,500 mL/hr at 12/15/18 0949  . TPN ADULT (ION) 75 mL/hr at 12/15/18 1100  . TPN ADULT (ION)     . chlorhexidine gluconate (MEDLINE KIT)  15 mL Mouth Rinse BID  . Chlorhexidine Gluconate Cloth  6 each Topical Daily  . dorzolamide-timolol  1 drop Both Eyes BID  . insulin aspart  3-9 Units Subcutaneous Q6H  . levothyroxine  12.5 mcg Intravenous Daily  . mouth rinse  15 mL Mouth Rinse 10 times per day  . pantoprazole (PROTONIX) IV  40 mg Intravenous Q24H   sodium chloride, sodium chloride, albuterol, alteplase, fentaNYL (SUBLIMAZE) injection, heparin, hydrALAZINE, metoprolol tartrate, sodium chloride, sodium chloride flush  Assessment/ Plan:   Acute kidney injury with very little signs of recovery.  She is anuric she is requiring CRRT.  Baseline serum creatinine 0.9.  Acute kidney injury following perforated small bowel with fecal peritonitis and wound debridement.  Hypotension/volumes continue to keep even.  She may be able to tolerate some volume removal.  She is now on IV pressors  Electrolytes appear to be stable at this time  Acid-base balance stable at this time  Perforated small bowel with acute kidney injury 6/9 to 11/26/2018.  New recent bowel leak due to nonhealing of anastomosis went to the OR 11/22/2018 11/15/2018 appreciate assistance from Dr. Barry Dienes  Diabetes mellitus per primary team  Nutrition continues on TNA  Hypophosphatemia we will add additional IV phosphate     LOS: [place] _0 _1 :53 AM

## 2018-12-15 DEATH — deceased

## 2018-12-16 ENCOUNTER — Inpatient Hospital Stay (HOSPITAL_COMMUNITY): Payer: BLUE CROSS/BLUE SHIELD

## 2018-12-16 LAB — COMPREHENSIVE METABOLIC PANEL
ALT: 22 U/L (ref 0–44)
AST: 24 U/L (ref 15–41)
Albumin: 1.4 g/dL — ABNORMAL LOW (ref 3.5–5.0)
Alkaline Phosphatase: 225 U/L — ABNORMAL HIGH (ref 38–126)
Anion gap: 7 (ref 5–15)
BUN: 45 mg/dL — ABNORMAL HIGH (ref 8–23)
CO2: 26 mmol/L (ref 22–32)
Calcium: 7.6 mg/dL — ABNORMAL LOW (ref 8.9–10.3)
Chloride: 104 mmol/L (ref 98–111)
Creatinine, Ser: 0.99 mg/dL (ref 0.44–1.00)
GFR calc Af Amer: >60 mL/min (ref >60)
GFR calc non Af Amer: >60 mL/min (ref >60)
Glucose, Bld: 114 mg/dL — ABNORMAL HIGH (ref 70–99)
Potassium: 4.1 mmol/L (ref 3.5–5.1)
Sodium: 137 mmol/L (ref 135–145)
Total Bilirubin: 0.4 mg/dL (ref 0.3–1.2)
Total Protein: 6 g/dL — ABNORMAL LOW (ref 6.5–8.1)

## 2018-12-16 LAB — PREPARE RBC (CROSSMATCH)

## 2018-12-16 LAB — RENAL FUNCTION PANEL
Albumin: 1.3 g/dL — ABNORMAL LOW (ref 3.5–5.0)
Anion gap: 7 (ref 5–15)
BUN: 48 mg/dL — ABNORMAL HIGH (ref 8–23)
CO2: 26 mmol/L (ref 22–32)
Calcium: 7.6 mg/dL — ABNORMAL LOW (ref 8.9–10.3)
Chloride: 105 mmol/L (ref 98–111)
Creatinine, Ser: 1.02 mg/dL — ABNORMAL HIGH (ref 0.44–1.00)
GFR calc Af Amer: >60 mL/min (ref >60)
GFR calc non Af Amer: 58 mL/min — ABNORMAL LOW (ref >60)
Glucose, Bld: 122 mg/dL — ABNORMAL HIGH (ref 70–99)
Phosphorus: 2.6 mg/dL (ref 2.5–4.6)
Potassium: 4.3 mmol/L (ref 3.5–5.1)
Sodium: 138 mmol/L (ref 135–145)

## 2018-12-16 LAB — GLUCOSE, CAPILLARY
Glucose-Capillary: 105 mg/dL — ABNORMAL HIGH (ref 70–99)
Glucose-Capillary: 106 mg/dL — ABNORMAL HIGH (ref 70–99)
Glucose-Capillary: 110 mg/dL — ABNORMAL HIGH (ref 70–99)

## 2018-12-16 LAB — MAGNESIUM: Magnesium: 2.2 mg/dL (ref 1.7–2.4)

## 2018-12-16 LAB — CBC
HCT: 21.6 % — ABNORMAL LOW (ref 36.0–46.0)
Hemoglobin: 6.7 g/dL — CL (ref 12.0–15.0)
MCH: 29.9 pg (ref 26.0–34.0)
MCHC: 31 g/dL (ref 30.0–36.0)
MCV: 96.4 fL (ref 80.0–100.0)
Platelets: 135 10*3/uL — ABNORMAL LOW (ref 150–400)
RBC: 2.24 MIL/uL — ABNORMAL LOW (ref 3.87–5.11)
RDW: 15.8 % — ABNORMAL HIGH (ref 11.5–15.5)
WBC: 22.4 10*3/uL — ABNORMAL HIGH (ref 4.0–10.5)
nRBC: 0.1 % (ref 0.0–0.2)

## 2018-12-16 LAB — PHOSPHORUS: Phosphorus: 2.2 mg/dL — ABNORMAL LOW (ref 2.5–4.6)

## 2018-12-16 LAB — HEPARIN LEVEL (UNFRACTIONATED)
Heparin Unfractionated: 0.71 IU/mL — ABNORMAL HIGH (ref 0.30–0.70)
Heparin Unfractionated: 0.59 IU/mL (ref 0.30–0.70)

## 2018-12-16 LAB — HEMOGLOBIN AND HEMATOCRIT, BLOOD
HCT: 22.8 % — ABNORMAL LOW (ref 36.0–46.0)
Hemoglobin: 7.2 g/dL — ABNORMAL LOW (ref 12.0–15.0)

## 2018-12-16 MED ORDER — HEPARIN (PORCINE) 25000 UT/250ML-% IV SOLN
1500.0000 [IU]/h | INTRAVENOUS | Status: DC
  Administered 2018-12-16: 1500 [IU]/h via INTRAVENOUS
  Filled 2018-12-16: qty 250

## 2018-12-16 MED ORDER — SODIUM PHOSPHATES 45 MMOLE/15ML IV SOLN
10.0000 mmol | Freq: Once | INTRAVENOUS | Status: AC
  Administered 2018-12-16: 10 mmol via INTRAVENOUS
  Filled 2018-12-16: qty 3.33

## 2018-12-16 MED ORDER — STERILE WATER FOR INJECTION IV SOLN
INTRAVENOUS | Status: AC
  Administered 2018-12-16: 17:00:00 via INTRAVENOUS
  Filled 2018-12-16: qty 1320

## 2018-12-16 MED ORDER — SODIUM CHLORIDE 0.9% IV SOLUTION: Freq: Once | INTRAVENOUS | Status: DC

## 2018-12-16 NOTE — Progress Notes (Signed)
3 Days Post-Op    CC: Abdominal pain  Subjective: No real change.  Sedated on the Vent, desaturates very easily if there are any issue with the Vent.  H/H is down this AM.  Objective: Vital signs in last 24 hours: Temp:  [96.6 F (35.9 C)-98.6 F (37 C)] 97.2 F (36.2 C) (07/02 0700) Pulse Rate:  [90] 90 (07/02 0017) Resp:  [16-26] 19 (07/02 0700) BP: (101-178)/(35-70) 153/49 (07/02 0700) SpO2:  [100 %] 100 % (07/02 0017) FiO2 (%):  [30 %] 30 % (07/02 0328) Last BM Date: 12/12/18 2763 IV Urine 52 Wound vac 665 CRRT 2642 Afebrile, VSS - off pressors WBC 22.4 CMP Stable creatinine down to 0.99 H/H 6.7/21.6 Intake/Output from previous day: 07/01 0701 - 07/02 0700 In: 2763.6 [I.V.:2273; IV Piggyback:490.6] Out: 3829 [Urine:52; Emesis/NG output:470; Drains:665] Intake/Output this shift: No intake/output data recorded.  General appearance: sedated on the Vent Resp: clear to auscultation bilaterally and anterior exam on Vent Cardio: tachycardic GI: Wound vac in place  Lab Results:  Recent Labs    12/15/18 0522 12/16/18 0600  WBC 19.9* 22.4*  HGB 7.5* 6.7*  HCT 24.1* 21.6*  PLT 108* 135*    BMET Recent Labs    12/15/18 0522 12/15/18 1553  NA 137 138  K 4.1 4.0  CL 103 100  CO2 25 26  GLUCOSE 113* 163*  BUN 44* 44*  CREATININE 1.06* 1.04*  CALCIUM 7.5* 7.1*   PT/INR No results for input(s): LABPROT, INR in the last 72 hours.  Recent Labs  Lab 12/11/2018 0500 12/09/2018 1600 12/14/18 0551 12/14/18 1600 12/15/18 0522 12/15/18 1553  AST 30  --   --   --   --   --   ALT 27  --   --   --   --   --   ALKPHOS 217*  --   --   --   --   --   BILITOT 1.0  --   --   --   --   --   PROT 5.7*  --   --   --   --   --   ALBUMIN 1.5*  1.4* 1.3* 1.3* 1.4* 1.3* 1.3*     Lipase     Component Value Date/Time   LIPASE 24 11/20/2018 0355     Medications: . chlorhexidine gluconate (MEDLINE KIT)  15 mL Mouth Rinse BID  . Chlorhexidine Gluconate Cloth  6 each  Topical Daily  . dorzolamide-timolol  1 drop Both Eyes BID  . insulin aspart  3-9 Units Subcutaneous Q6H  . levothyroxine  12.5 mcg Intravenous Daily  . mouth rinse  15 mL Mouth Rinse 10 times per day  . pantoprazole (PROTONIX) IV  40 mg Intravenous Q24H    Assessment/Plan Sepsis Ongoing hypotension-low-dose Levophed Acute kidney injury/tubular necrosis-CRRT (1.06 creatinine) Acute respiratory arrest 11/26/2018 Acute hypoxic respiratory failure/atelectasis/pleural effusion: Reintubated 01/15/64 Acute metabolic encephalopathy-head CT 12/04/2018: No acute abnormality Takatsubo's cardiomyopathy: EF ~ 20% Anemia transfused 12/05/2018 DVT left IJ 6/24-Heparin drip Thrombocytopenia Severe malnutrition-prealbumin 6.8(6/29)>>5.8(6/30 Leukocytosis 17.6(6/27) >>20.1(6/28) >>22.8(6/29)>>23.2(6/30)>>19.9(7/1)>>22.4(7/2) Hypothyroid Full Code - ongoing full support  Perforated small bowel with closed-loop obstruction 1.Exploratory laparotomy small bowel resection, lysis of adhesions, 1.5 hours, placement of wound VAC 11/24/2018 Dr. Coralie Keens, POD #24 2. Exploratory laparotomy, washout and placement of wound VAC 11/15/2018 Dr. Erroll Luna, POD #20 3. Exploratory laparotomy with anastomosis of small bowel and closure of abdominal wall 12/04/2018 Dr. Marcello Moores CornettPOD#17 4.Exploratory laparotomy, resection of previous anastomosis, wound VAC  placement 11/26/2018 Dr. Autumn Messing POD #5 5. Reopening of recent laparotomy, small bowel resection, placement of wound VAC 12/14/2018, Dr. Dorris Fetch ByerlyPOD#2(FINDINGS:Pus throughout abdomen, though minimal succus. Bilious rind over intestines. Small hole on distal segment of small bowel with small amount of bile)  FEN: N.p.o./TPN ID: Maxipime 6/21 -6/25; Eraxis 6/26>>day7; Zosyn 6/26>>day7 DVT: Heparin drip Follow-up: To be determined POC: Mattier,George Brother [phone]  [phone]  Matier,Vickie    [phone]    Plan:   Transfuse her this AM.  May take her back to the OR tomorrow pending family decision.       LOS: 25 days    Candyce Gambino 12/16/2018 [phone]

## 2018-12-16 NOTE — Progress Notes (Addendum)
Pharmacy Brief Note - Anticoagulation Follow Up:  Afternoon follow up brief note. For full work up and assessment, see note from Leodis Sias, PharmD.   Assessment:  HL = 0.59 is therapeutic on heparin infusion of 1500 units/hr  Hgb 7.2 - stable compared to AM lab. Slightly increased s/p transfusion  Per discussion with RN, there continues to be red-tinged output from wound vac - but no significant changes since surgery saw and evaulated patient this morning.   Confirmed heparin infusion running at correct rate   Plan:  Continue heparin infusion at current rate of 1500 units/hr  Check confirmatory HL in 8 hours  CBC with AM labs tomorrow  Notes mention possibility of pt returning to OR tomorrow. Surgery to determine when heparin should be stopped/resumed around timing of surgical procedure.   Lenis Noon, PharmD 12/16/18 6:51 PM

## 2018-12-16 NOTE — Anesthesia Preprocedure Evaluation (Addendum)
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%) and + DVT (IJ)   Rhythm:Regular Rate:Tachycardia     Neuro/Psych negative neurological ROS  negative psych ROS   GI/Hepatic Neg liver ROS, Perforated bowel w/ open abdomen s/p multiple surgeries   Endo/Other  diabetes, Type 2  Renal/GU ARF and DialysisRenal disease  negative genitourinary   Musculoskeletal negative musculoskeletal ROS (+)   Abdominal   Peds  Hematology  (+) anemia ,   Anesthesia Other Findings   Reproductive/Obstetrics                           Anesthesia Physical  Anesthesia Plan  ASA: IV  Anesthesia Plan: General   Post-op Pain Management:    Induction: Inhalational and Intravenous  PONV Risk Score and Plan: 3 and Ondansetron, Dexamethasone, Midazolam and Treatment may vary due to age or medical condition  Airway Management Planned: Oral ETT  Additional Equipment: None  Intra-op Plan:   Post-operative Plan: Post-operative intubation/ventilation  Informed Consent: I have reviewed the patients History and Physical, chart, labs and discussed the procedure including the risks, benefits and alternatives for the proposed anesthesia with the patient or authorized representative who has indicated his/her understanding and acceptance.       Plan Discussed with: CRNA, Anesthesiologist and Surgeon  Anesthesia Plan Comments:         Anesthesia Quick Evaluation

## 2018-12-16 NOTE — Progress Notes (Signed)
[facility] ROUNDING NOTE   Subjective:   This is a 63 year old lady with acute kidney injury last baseline 0.79 06/04/2018.  She had acute oliguric acute tubular necrosis in the setting of septic shock from bowel perforation.  Was on CRRT from 6/ 9  to  6/12 was held after cardiac arrest.  She resumed 11/18/2018 after abdominal closure.  Still no signs of renal recovery.  Status post small bowel perforation due to closed-loop SBO and septic shock followed by CCM underwent closure 11/24/2018 new recent bowel leak due to nonhealing anastomosis.  Went back to the OR 11/22/2018 for resection of anastomosis.  She went back to the OR 12/01/2018.  She has a large wound VAC.  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  No history of diabetes mellitus type 2.  No recorded urine output.  Has been kept even on CRRT  Blood pressure 148/41 pulse 86 temperature 97 O2 sats 100% 30% FiO2  Sodium 137 potassium 4.1 chloride 104 CO2 26 BUN 45 creatinine 0.9 glucose 114 calcium 7.6 phosphorus 2.2 magnesium 2.2 albumin 1.4 WBC 22.4 hemoglobin 6.7 platelets 135  IV heparin IV TPN  IV Zosyn IV eraxis   CT scan abdomen 12/09/2018 shows large amount of intraperitoneal free fluid which is progressed to large volumes ascites.  Prominent intraperitoneal free air collection under the anterior abdominal wall with gas scattered in the central small bowel mesentery.  Patchy airspace disease.  Levothyroxine 12.5 mcg IV daily Protonix 40 mg IV every 24 hours   Objective:  Vital signs in last 24 hours:  Temp:  [96.6 F (35.9 C)-98.6 F (37 C)] 97.5 F (36.4 C) (07/02 1000) Pulse Rate:  [90-103] 103 (07/02 0800) Resp:  [16-26] 20 (07/02 1000) BP: (101-178)/(35-70) 148/41 (07/02 1000) SpO2:  [100 %] 100 % (07/02 0800) FiO2 (%):  [30 %] 30 % (07/02 0800) Weight:  [67.1 kg] 67.1 kg (07/02 0500)  Weight change: -2.4 kg Filed Weights   12/14/18 0747 12/15/18 0600 12/16/18  0500  Weight: 69.5 kg 67.1 kg 67.1 kg    Intake/Output: I/O last 3 completed shifts: In: 4028.1 [I.V.:3402.7; IV Piggyback:625.4] Out: 5909 [Urine:98; Emesis/NG output:860; Drains:945; YTKPT:4656]   Intake/Output this shift:  Total I/O In: 285.2 [I.V.:270.4; IV Piggyback:14.9] Out: 341 [Emesis/NG output:50; Drains:50; Other:241] Intubated and sedated making grimacing faces. CVS- RRR no JVP RS- CTA intubated with rhonchorous breath sounds ABD- BS present soft non-distended EXT-1-2+ lower extremity edema   Basic Metabolic Panel: Recent Labs  Lab 12/12/18 0545  11/22/2018 0500  12/14/18 0551 12/14/18 1600 12/15/18 0522 12/15/18 1553 12/16/18 0600  NA 135   < > 136  137   < > 138 135 137 138 137  K 4.0   < > 4.2  3.7   < > 4.1 4.1 4.1 4.0 4.1  CL 102   < > 103  104   < > 104 103 103 100 104  CO2 25   < > 25  25   < > '27 26 25 26 26  ' GLUCOSE 115*   < > 119*  92   < > 113* 155* 113* 163* 114*  BUN 58*   < > 55*  45*   < > 47* 42* 44* 44* 45*  CREATININE 1.36*   < > 1.51*  1.14*   < > 1.15* 1.07* 1.06* 1.04* 0.99  CALCIUM 7.5*   < > 7.6*  7.3*   < > 7.5* 7.4* 7.5* 7.1* 7.6*  MG 2.2  --  2.3  --  2.1  --  2.2  --  2.2  PHOS 2.0*   < > 3.5   < > 2.5  2.5 1.9* 1.9* 3.0 2.2*   < > = values in this interval not displayed.    Liver Function Tests: Recent Labs  Lab 12/12/2018 0500  12/14/18 0551 12/14/18 1600 12/15/18 0522 12/15/18 1553 12/16/18 0600  AST 30  --   --   --   --   --  24  ALT 27  --   --   --   --   --  22  ALKPHOS 217*  --   --   --   --   --  225*  BILITOT 1.0  --   --   --   --   --  0.4  PROT 5.7*  --   --   --   --   --  6.0*  ALBUMIN 1.5*  1.4*   < > 1.3* 1.4* 1.3* 1.3* 1.4*   < > = values in this interval not displayed.   No results for input(s): LIPASE, AMYLASE in the last 168 hours. No results for input(s): AMMONIA in the last 168 hours.  CBC: Recent Labs  Lab 12/12/18 0545 12/12/18 1832 11/29/2018 0500 12/14/18 0551 12/15/18 0522  12/16/18 0600  WBC 20.1*  --  22.8* 23.2* 19.9* 22.4*  NEUTROABS  --   --  17.3*  --   --   --   HGB 6.6* 9.7* 9.2* 8.9* 7.5* 6.7*  HCT 21.1* 30.0* 28.5* 28.4* 24.1* 21.6*  MCV 94.6  --  92.8 97.6 96.8 96.4  PLT 128*  --  120* 102* 108* 135*    Cardiac Enzymes: No results for input(s): CKTOTAL, CKMB, CKMBINDEX, TROPONINI in the last 168 hours.  BNP: Invalid input(s): POCBNP  CBG: Recent Labs  Lab 12/15/18 0531 12/15/18 1149 12/15/18 1740 12/15/18 2326 12/16/18 0625  GLUCAP 108* 122* 118* 111* 105*    Microbiology: Results for orders placed or performed during the hospital encounter of 12/03/2018  SARS Coronavirus 2 (CEPHEID - Performed in [facility] hospital lab), Hosp Order     Status: None   Collection Time: 11/29/2018  3:55 AM   Specimen: Nasopharyngeal Swab  Result Value Ref [place] Status   SARS Coronavirus 2 NEGATIVE NEGATIVE Final    Comment: (NOTE) If result is NEGATIVE SARS-CoV-2 target nucleic acids are NOT DETECTED. The SARS-CoV-2 RNA is generally detectable in upper and lower  respiratory specimens during the acute phase of infection. The lowest  concentration of SARS-CoV-2 viral copies this assay can detect is 250  copies / mL. A negative result does not preclude SARS-CoV-2 infection  and should not be used as the sole basis for treatment or other  patient management decisions.  A negative result may occur with  improper specimen collection / handling, submission of specimen other  than nasopharyngeal swab, presence of viral mutation(s) within the  areas targeted by this assay, and inadequate number of viral copies  (<250 copies / mL). A negative result must be combined with clinical  observations, patient history, and epidemiological information. If result is POSITIVE SARS-CoV-2 target nucleic acids are DETECTED. The SARS-CoV-2 RNA is generally detectable in upper and lower  respiratory specimens dur ing the acute phase of infection.  Positive  results  are indicative of active infection with SARS-CoV-2.  Clinical  correlation with patient history and other diagnostic information is  necessary  to determine patient infection status.  Positive results do  not rule out bacterial infection or co-infection with other viruses. If result is PRESUMPTIVE POSTIVE SARS-CoV-2 nucleic acids MAY BE PRESENT.   A presumptive positive result was obtained on the submitted specimen  and confirmed on repeat testing.  While 2019 novel coronavirus  (SARS-CoV-2) nucleic acids may be present in the submitted sample  additional confirmatory testing may be necessary for epidemiological  and / or clinical management purposes  to differentiate between  SARS-CoV-2 and other Sarbecovirus currently known to infect humans.  If clinically indicated additional testing with an alternate test  methodology [phone]) is advised. The SARS-CoV-2 RNA is generally  detectable in upper and lower respiratory sp ecimens during the acute  phase of infection. The expected result is Negative. Fact Sheet for Patients:  StrictlyIdeas.no Fact Sheet for Healthcare Providers: BankingDealers.co.za This test is not yet approved or cleared by the [place] FDA and has been authorized for detection and/or diagnosis of SARS-CoV-2 by FDA under an Emergency Use Authorization (EUA).  This EUA will remain in effect (meaning this test can be used) for the duration of the COVID-19 declaration under Section 564(b)(1) of the Act, 21 U.S.C. section 360bbb-3(b)(1), unless the authorization is terminated or revoked sooner. Performed at [facility], [place] [address], [place], [place] [address]   MRSA PCR Screening     Status: None   Collection Time: 11/22/18 10:14 AM   Specimen: Nasal Mucosa; Nasopharyngeal  Result Value Ref Range Status   MRSA by PCR NEGATIVE NEGATIVE Final    Comment:        The GeneXpert MRSA Assay  (FDA approved for NASAL specimens only), is one component of a comprehensive MRSA colonization surveillance program. It is not intended to diagnose MRSA infection nor to guide or monitor treatment for MRSA infections. Performed at [facility], [place] [address], [place], [place] [address]   Culture, respiratory (non-expectorated)     Status: None   Collection Time: 12/05/18 11:25 AM   Specimen: Tracheal Aspirate; Respiratory  Result Value Ref [place] Status   Specimen Description   Final    TRACHEAL ASPIRATE Performed at [place] [address], [place], [place] [address]    Special Requests   Final    NONE Performed at [facility], [place] [address], [place], [place] [address]    Gram Stain   Final    RARE WBC PRESENT, PREDOMINANTLY PMN NO ORGANISMS SEEN Performed at [facility], [place] [address] [place], [place] [address]    Culture RARE ESCHERICHIA COLI  Final   Report Status 12/07/2018 FINAL  Final   Organism ID, Bacteria ESCHERICHIA COLI  Final      Susceptibility   Escherichia coli - MIC*    AMPICILLIN >=32 RESISTANT Resistant     CEFAZOLIN <=4 SENSITIVE Sensitive     CEFEPIME <=1 SENSITIVE Sensitive     CEFTAZIDIME <=1 SENSITIVE Sensitive     CEFTRIAXONE <=1 SENSITIVE Sensitive     CIPROFLOXACIN <=0.25 SENSITIVE Sensitive     GENTAMICIN <=1 SENSITIVE Sensitive     IMIPENEM <=0.25 SENSITIVE Sensitive     TRIMETH/SULFA <=20 SENSITIVE Sensitive     AMPICILLIN/SULBACTAM >=32 RESISTANT Resistant     PIP/TAZO <=4 SENSITIVE Sensitive     Extended ESBL NEGATIVE Sensitive     * RARE ESCHERICHIA COLI  Culture, blood (routine x 2)     Status: None   Collection Time: 12/07/18  10:50 AM   Specimen: BLOOD LEFT ARM  Result Value Ref [place] Status   Specimen Description   Final    BLOOD LEFT ARM Performed at [facility], [address], [place], [place] [address]    Special Requests   Final    BOTTLES  DRAWN AEROBIC AND ANAEROBIC Blood Culture adequate volume Performed at [facility] [address] [place], [place] [address]    Culture   Final    NO GROWTH 5 DAYS Performed at [facility], [place] [address], [place], [place] [address]    Report Status 12/12/2018 FINAL  Final  Culture, blood (routine x 2)     Status: None   Collection Time: 12/05/2018 12:24 PM   Specimen: BLOOD  Result Value Ref [place] Status   Specimen Description   Final    BLOOD RIGHT ARM Performed at [facility] [address] [place], [place] [address]    Special Requests   Final    BOTTLES DRAWN AEROBIC AND ANAEROBIC Blood Culture adequate volume Performed at [facility] [address] [place], [place] [address]    Culture   Final    NO GROWTH 5 DAYS Performed at [facility], [place] [address] [place], [place] [address]    Report Status 12/15/2018 FINAL  Final  Culture, blood (routine x 2)     Status: None   Collection Time: 11/20/2018  1:03 PM   Specimen: BLOOD  Result Value Ref [place] Status   Specimen Description   Final    BLOOD RIGHT ANTECUBITAL Performed at [place] [address] [place], [place] [address]    Special Requests   Final    BOTTLES DRAWN AEROBIC ONLY Blood Culture adequate volume Performed at [facility] [address], [place], [place] [address]    Culture   Final    NO GROWTH 5 DAYS Performed at [facility], [place] [address], [place],  [address]    Report Status 12/15/2018 FINAL  Final    Coagulation Studies: No results for input(s): LABPROT, INR in the last 72 hours.  Urinalysis: No results for input(s): COLORURINE, LABSPEC, PHURINE, GLUCOSEU, HGBUR, BILIRUBINUR, KETONESUR, PROTEINUR, UROBILINOGEN, NITRITE, LEUKOCYTESUR in the last 72 hours.  Invalid input(s): APPERANCEUR    Imaging: Dg Chest Port 1 View  Result Date: 12/16/2018 CLINICAL  DATA:  Respiratory failure. EXAM: PORTABLE CHEST 1 VIEW COMPARISON:  12/14/2018. FINDINGS: Endotracheal tube, NG tube, left PowerPort catheter, right IJ line, right subclavian line stable position. Heart size stable. Mild right base atelectasis/infiltrate small right pleural effusion again noted. Slight improvement in aeration. No pneumothorax. Surgical clips right axilla. IMPRESSION: 1.  Lines and tubes stable position. 2. Mild right base atelectasis/infiltrate small right pleural effusion again noted. Slight improvement in aeration from prior exam. Electronically Signed   By: Marcello Moores  Register   On: 12/16/2018 08:03     Medications:   .  prismasol BGK 4/2.5 400 mL/hr at 12/16/18 0200  .  prismasol BGK 4/2.5 200 mL/hr at 12/15/18 1606  . sodium chloride Stopped (12/12/18 1005)  . sodium chloride Stopped (12/11/18 1655)  . anidulafungin Stopped (12/15/18 1730)  . fentaNYL infusion INTRAVENOUS 50 mcg/hr (12/16/18 0700)  . heparin 1,500 Units/hr (12/16/18 1000)  . norepinephrine (LEVOPHED) Adult infusion Stopped (12/15/18 0400)  . piperacillin-tazobactam (ZOSYN)  IV Stopped (12/16/18 0708)  . prismasol BGK 4/2.5 1,500 mL/hr at 12/16/18 0540  . sodium  phosphate  Dextrose 5% IVPB 42 mL/hr at 12/16/18 1000  . TPN ADULT (ION) 75 mL/hr at 12/16/18 1000  . TPN ADULT (ION)     . sodium chloride   Intravenous Once  . chlorhexidine gluconate (MEDLINE KIT)  15 mL Mouth Rinse BID  . Chlorhexidine Gluconate Cloth  6 each Topical Daily  . dorzolamide-timolol  1 drop Both Eyes BID  . insulin aspart  3-9 Units Subcutaneous Q6H  . levothyroxine  12.5 mcg Intravenous Daily  . mouth rinse  15 mL Mouth Rinse 10 times per day  . pantoprazole (PROTONIX) IV  40 mg Intravenous Q24H   sodium chloride, sodium chloride, albuterol, alteplase, fentaNYL (SUBLIMAZE) injection, heparin, hydrALAZINE, metoprolol tartrate, sodium chloride, sodium chloride flush  Assessment/ Plan:   Acute kidney injury with very little  signs of recovery.  She is anuric she is requiring CRRT.  Baseline serum creatinine 0.9.  Acute kidney injury following perforated small bowel with fecal peritonitis and wound debridement.  Hypotension/volumes continue to keep even.  She may be able to tolerate some volume removal.  IV pressors have been discontinued blood pressure appears to be doing fine  Electrolytes appear to be stable at this time  Acid-base balance stable at this time  Perforated small bowel with acute kidney injury 6/9 to 11/26/2018.  New recent bowel leak due to nonhealing of anastomosis went to the OR 11/19/2018 11/22/2018 appreciate assistance from Dr. Barry Dienes  Diabetes mellitus per primary team  Nutrition continues on TNA  Hypophosphatemia additional phosphorus being added by pharmacy     LOS: Lilly '@TODAY' '@10' :16 AM

## 2018-12-16 NOTE — Progress Notes (Addendum)
PHARMACY - ADULT TOTAL PARENTERAL NUTRITION CONSULT NOTE   Pharmacy Consult for TPN Indication: prolonged ileus  HPI: 60 yoF admitted 6/7 with perforated small bowel from closed-loop obstruction and underwent emergent exploratory laparotomy. Returned to OR on 6/9 and again on 6/14 for closure of abdominal wall.  Patient coded on 04-Dec-2022 and developed multiorgan failure. Remains intubated & on CRRT. Pressors have been weaned off and she is completing antibiotic course for sepsis/PNA.  NPO since admission on 6/7. Pharmacy consulted to start TPN 6/16. D/t acute liver injury from shock, initiated TPN at low rate & advance slowly as requested by surgery.  Patient Measurements: Height: '5\' 4"'  (162.6 cm) Weight: 147 lb 14.9 oz (67.1 kg) IBW/kg (Calculated) : 54.7 TPN AdjBW (KG): 59.7 Body mass index is 25.39 kg/m. Usual Weight: 90kg   Intake/Output Summary (Last 24 hours) at 12/16/2018 0753 Last data filed at 12/16/2018 0700 Gross per 24 hour  Intake 2798.39 ml  Output 3979 ml  Net -1180.61 ml    Recent Labs    12/14/18 0551  12/15/18 0522 12/15/18 1553 12/16/18 0600  NA 138   < > 137 138 137  K 4.1   < > 4.1 4.0 4.1  CL 104   < > 103 100 104  CO2 27   < > '25 26 26  ' GLUCOSE 113*   < > 113* 163* 114*  BUN 47*   < > 44* 44* 45*  CREATININE 1.15*   < > 1.06* 1.04* 0.99  CALCIUM 7.5*   < > 7.5* 7.1* 7.6*  PHOS 2.5  2.5   < > 1.9* 3.0 2.2*  MG 2.1  --  2.2  --  2.2  ALBUMIN 1.3*   < > 1.3* 1.3* 1.4*  ALKPHOS  --   --   --   --  225*  AST  --   --   --   --  24  ALT  --   --   --   --  22  BILITOT  --   --   --   --  0.4  TRIG 100  --   --   --   --   PREALBUMIN 5.8*  --   --   --   --    < > = values in this interval not displayed.    Significant events:  6/18: OK to increase to goal rate per surgery 6/22: CRRT stopped for holiday; CBG 69 overnight, D50 given but TPN continued (55 units insulin in TPN); BS and BM overnight - initiating trickle feeds 6/23: resuming CRRT, return  to high-protein TPN; fevers overnight, CCM removing some central lines; will use chemo port for TPN 6/24: began adding lipids to TPN formula 6/25: trickle feeds stopped d/t abd distention 6/26: CRRT clotted off overnight; patient to OR this AM for ex lap; RN attempting to restart CRRT post-op but still having issues at this time 6/27: CRRT appears to have been successfully resumed overnight  6/29 OR pus throughout abdomen. Small hole in small bowel  Central access: 11/26/2018 TPN start date: 11/30/18  ASSESSMENT  Current Nutrition: NPO IVF: On CRRT to keep net neutral I/O  Today:   Glucose (goal 100-150) - Hx DM on 70/30 Novolog 20 units BID PTA.  CBGs acceptable  25 units insulin in TPN + ICU resistant scale; 3 units SSI required yesterday  Electrolytes - on CRRT; WNL x phos 1.9 > 2.2 after 15 mM Na phos bolus yesterday  Renal - AKI; SCr, bicarb normalized on CRRT, BUN remains elevated  LFTs - albumin remains low; alk phos 225  TGs - WNL (6/30)  Prealbumin - down to 5.8 6/30, reflects inflammation and critical illness,  NUTRITIONAL GOALS                                                                                             RD recs: (6/29):  Kcal:1514 kcal, Protein:195-215 grams Fluid:>/= 1.8 L/day  PLAN                                                                                                                         Now: [place] 10 mMol At 1800 today:  Continue TPN at 75 ml/hr  custom TPN at goal rate of 75 ml/hr provides: 198 g/day protein, 1512 Kcal/day = 100% support  Electrolytes: continue to hold Mag, small K, increased Na, Cl:Ac = 1:2  TPN to contain standard multivitamins, trace elements MWF only due to national backorder.  IVF per MD  Continue ICU resistant-scale glycemic control orders q6h; continue 25 units regular insulin in TPN   TPN lab  panels Mondays & Thursdays  Ordered BID renal function panels while on CRRT  Eudelia Bunch, Pharm.D 12/16/2018 7:53 AM

## 2018-12-16 NOTE — Progress Notes (Signed)
Pharmacy Antibiotic Note  Carmen Stone is a 63 y.o. female admitted on 12/14/2018 with perforated loop of SB. Her hospital stay has been complicated by cardiac arrest requiring intubation & pressors as well as CRRT for AKI.  She completed 10d course of Zosyn on 6/17.   She had low grade fever of 100.45F & bradycardia requiring pressors to be resumed today.  WBC has also been trending up off Zosyn.  Chest CT 6/20 could not rule out pneumonia.  Pharmacy was consulted for Vancomycin & Cefepime dosing.  6/23: new fevers, BCx ordered, removed 1 of 2 central lines, changed Foley, using Port for TPN 6/26 Fevers this AM and taken back to OR this AM revealing perforated bowel, cefepime changed to Zosyn + Eraxis 6/29 OR pus throughout abdomen. Small hole in small bowel 12/16/2018 WBC remains elevated 22.4 today. AF/hypothermic, on CRRT, intubated and sedated, off levophed 7/1 AM  Plan:  Zosyn 3.375 g IV q6 hrs each dose over 30 mins while on CRRT  Eraxis per MD, dosing appropriate  Height: 5\' 4"  (162.6 cm) Weight: 147 lb 14.9 oz (67.1 kg) IBW/kg (Calculated) : 54.7  Temp (24hrs), Avg:97.6 F (36.4 C), Min:96.6 F (35.9 C), Max:98.6 F (37 C)  Recent Labs  Lab 12/09/18 1856  11/19/2018 1222  12/12/18 0545  11/22/2018 0500  12/14/18 0551 12/14/18 1600 12/15/18 0522 12/15/18 1553 12/16/18 0600  WBC  --    < >  --    < > 20.1*  --  22.8*  --  23.2*  --  19.9*  --  22.4*  CREATININE  --    < >  --    < > 1.36*   < > 1.51*  1.14*   < > 1.15* 1.07* 1.06* 1.04* 0.99  LATICACIDVEN 2.0*  --  1.6  --   --   --   --   --   --   --   --   --   --    < > = values in this interval not displayed.    Estimated Creatinine Clearance: 54.1 mL/min (by C-G formula based on SCr of 0.99 mg/dL).    No Known Allergies  Antimicrobials this admission: 6/7 zosyn > 6/17  6/26 Zosyn >> 6/7 fluconazole > 6/11 6/8 vancomycin >> 6/8, resumed 6/21>> 6/24 6/21 cefepime>> 6/26 6/26 Eraxis >>  Dose adjustments  this admission:  Microbiology results: 6/7 Covid 19 negative 6/8 MRSA PCR: negative 6/21 Tracheal aspirate: rare E.Coli; R to ampicillin and Unasyn 6/23 BCx (x1): ngF 6/26 BCx2: ngF  Thank you for allowing pharmacy to be a part of this patient's care.  Eudelia Bunch, Pharm.D [phone] 12/16/2018 7:45 AM

## 2018-12-16 NOTE — Progress Notes (Signed)
[facility] for IV heparin Indication: IJ DVT  No Known Allergies  Patient Measurements: Height: 5\' 4"  (162.6 cm) Weight: 147 lb 14.9 oz (67.1 kg) IBW/kg (Calculated) : 54.7 Heparin Dosing Weight: 71 kg  Vital Signs: Temp: 97.2 F (36.2 C) (07/02 0700) BP: 153/49 (07/02 0700) Pulse Rate: 90 (07/02 0017)  Labs: Recent Labs    12/14/18 0551  12/15/18 0522 12/15/18 1553 12/16/18 0600  HGB 8.9*  --  7.5*  --  6.7*  HCT 28.4*  --  24.1*  --  21.6*  PLT 102*  --  108*  --  135*  HEPARINUNFRC 0.42  --  0.47  --  0.71*  CREATININE 1.15*   < > 1.06* 1.04* 0.99   < > = values in this interval not displayed.    Estimated Creatinine Clearance: 54.1 mL/min (by C-G formula based on SCr of 0.99 mg/dL).  Assessment: 34 yoF known to pharmacy from TPN and antibiotic dosing; complicated hospital course including cardiac arrest, intubation, VAP, and CRRT, now further complicated with extensive IJ clot (only L sided catheter removed 6/9). Pharmacy to dose heparin infusion.   Baseline INR, aPTT: baseline INR/aPTT mildly elevated but did have recent acute liver injury  Prior anticoagulation: none  Today, 12/16/2018: Heparin level is at the high end of goal at 0.71 on rate of 1650 units/hr. Hg has dropped to 6.7.  RN reports bleeding last night from wound VAC that has since been changed out.  PLTC up to 135 today. CRRT continues.  Last transfusion 6/28 w/ 2 Units.   Goal of Therapy: Heparin level 0.3-0.7 units/ml Monitor platelets by anticoagulation protocol: Yes  Plan:  Decrease heparin to 1500 units/hr and check 8 hr HL  Daily CBC and heparin level  Monitor for signs of bleeding or thrombosis  Eudelia Bunch, Pharm.D [phone] 12/16/2018 7:30 AM

## 2018-12-16 NOTE — Progress Notes (Signed)
NAME:  Carmen Stone, MRN:  [id number], DOB:  11-27-1955, LOS: 27 ADMISSION DATE:  12/03/2018, CONSULTATION DATE:  11/21/2018 REFERRING MD:  Dr. Ninfa Linden, Surgery, CHIEF COMPLAINT:  Abdominal pain   Brief History   63 y/o female presented to ER 12/09/2018 with nausea, vomiting, abdominal pain.  CT abd/pelvis showed focal perforated loop of SB with free air with multiple complex ventral hernias.  Underwent emergent laparotomy with SB resection, lysis of adhesions and wound vac.  Remained on vent and pressors post op.  Hospital course complicated by respiratory arrest 6/13, multiple reintubations, intermittent vasopressor needs, AKI on CVVHD.     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 Changing volume goal on CRRT to -100 cc an hour. Weaned 7 hours PSV   6/16 Encephalopathic. Hypertensive, adding labetalol.  IV TNA started 6/17 A little more awake.  Tolerating PSV but SBI exceeds 105.  Volume removal with CVVHD 6/18 Neuro status improved.  Required brief vasoactive drips.  Net negative fluid volume status.PSV 6/19 Extubated 6/20 reintubated for altered mentation, head CT negative  6/21 Tx PRBC x1.  Off vasopressors 6/23 Febrile overnight, Blood Cx. Transfuse 1 U PRBC 6/24 Afebrile. Remains off pressors. 100cc/hr removal /c CRRT. 30% FiO2.  6/25 L IJ, subclavian, axillary  + DVT. Heparin initiated. Remains on CRRT.  6/26  CT ABD/pelvis positive for free air and ascites. Back to OR  for ex lap of perforated viscous, resection of previous anastomosis, application of wound vac.  1U RBCs intra op.   6/28 Back on pressors with hgb <7 / sedated on fent drip, PRBC 6/29 Pending OR for possible closure  7/01 Less sedation, following commands, abd open with VAC, on CVVHD (even)  Consults:  PCCM 6/7 Renal 6/09 AKI, acidosis Cardiology 6/09 Takotsubo CM   Procedures:  ETT 6/07 >> 6/19, 6/20 >> Rt IJ HD 6/09 >> Rt [place] CVL 6/09 >> Lt radial aline 6/09 >> discontinued  Significant Diagnostic Tests:  CT abd/pelvis 6/07 >> focal perforated loop of SB with free air with multiple complex ventral hernias Echo 6/08 >> EF 20%, Takotsubo CM CT head 6/20 >> negative for any acute findings CT abdomen 6/20 >> bilateral effusions, atelectasis left lower lobe, results noted.    CT Chest 6/20 >> small bilateral pleural effusions with compressive atelectasis, anterior R 4-7 rib fractures CT ABD/Pelvis 6/20 >> small bowel surgical changes, mild circumferential wall thickening of scattered small bowel loops, small to moderate ascites LUE Duplex 6/24 >> DVT L IJ, subclavian, axillary, SVT L cephalic  CT ABD/Pelvis 4/09 >> large volume ascites with intraperitoneal free air   Micro Data:  COVID 6/07 >> negative Tracheal aspirate 6/20 >> E Coli sensitive to zosyn BCx2 6/23 >> negative BCx2  6/26 >> negative   Antimicrobials:  Zosyn 6/07 >> 6/17 Diflucan 6/07 >> 6/10 Cefepime 6/21 >>6/26 Vanco 6/21 >> 6/25 Anidulafungin 6/26 >> Zosyn 6/26 >>  Interim history/subjective:  Some volume removal with CVVH Pressors off   Objective   Blood pressure (!) 139/45, pulse 90, temperature (!) 97 F (36.1 C), resp. rate Marland Kitchen)  21, height 5\' 4"  (1.626 m), weight 67.1 kg, SpO2 100 %.    Vent Mode: PRVC FiO2 (%):  [30 %] 30 % Set Rate:  [18 bmp] 18 bmp Vt Set:  [430 mL] 430 mL PEEP:  [5 cmH20] 5 cmH20 Plateau Pressure:  [14 cmH20-23 cmH20] 19 cmH20   Intake/Output Summary (Last 24 hours) at 12/16/2018 2536 Last data filed at 12/16/2018 0700 Gross per 24 hour  Intake 2706.86 ml  Output 3847  ml  Net -1140.14 ml   Filed Weights   12/14/18 0747 12/15/18 0600 12/16/18 0500  Weight: 69.5 kg 67.1 kg 67.1 kg    Examination: General: Chronically ill-appearing woman, intubated, ventilated HEENT: ET tube in place, right IJ HD catheter Neuro: Opens eyes to voice, nods to questions, follows commands with generalized weakness CV: Regular, heart rate 103, distant, no murmur PULM: Clear breath sounds, few upper zone rhonchi GI: Wound VAC is in place, serosanguineous drainage Extremities: No edema Skin: No rash  Resolved Hospital Problem list   Metabolic alkalosis Abdominal peritonitis with septic shock Circulatory shock, mix of cardiogenic and sepsis off pressors as of 6/15 Cardiorespiratory arrest 6/13 Shock liver Acute metabolic encephalopathy Hypophosphatemia  Assessment & Plan:   Small Bowel Perforation s/p Resection, Lysis of Adhesions -reexploration 6/14, creation of functional end to end small bowel anastomosis and closure of abdominal wound -reexploration 6/26 with pervious anastomosis broken down with holes in same s/p resection of anastomosis with placement of wound vac -6/29 OR findings of pus in the abd, bilious inflammatory rind on intestines & additional perforation P: Unclear that she can recover from this despite heroic efforts by CCS.  Defer to their advice regarding prognosis, efficacy of any further surgery.  Palliative care is participating in these discussions as well. If the patient and family want to push forward longer then I recommend tracheostomy to facilitate continued mechanical ventilation. TPN as ordered  Acute Metabolic Encephalopathy -Previous head CT negative, similar unexplained events during this admission  -Suspect multifactorial in setting of critical illness, infection and protracted course, improved 7/1 P: Balance sedating medications with adequate pain control Supportive care  Fever, severe sepsis -suspect 2/2 DVT and peritonitis  and less likely E-Coli VAP -Has been Tx for E coli in sputum -Blood Cx remain NGTD however pt had anastomosis broken down with holes in same s/p re-ex lap with resection P: Antimicrobials as outlined above, duration unclear, indefinite based on intra-abdominal findings  Anemia -suspect anemia of critical illness, acute blood loss and nutritional   P:  Transfuse PRBC 7/2 Follow CBC, attempt to minimize blood draws etc.  Acute Hypoxic Respiratory Failure  -3rd intubation. Unfortunately she has not been in a position to attempt SBT and vent liberation. She is unlikely able to be weaned from vent at this point and would require trach/long term mechanical ventilation and continued weaning efforts P: Continue PRVC 8 cc/kg Okay for PSV if mental status, degree of sedation/pain control will allow.  She is not a candidate for extubation given her overall condition, debilitation, mental status Again if we are to push forward she likely needs tracheostomy.  Given the intra-abdominal status, prognosis for overall recovery very poor.  I believe it would be reasonable to defer tracheostomy and transition to comfort based on this.  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%, Takotsubo Cardiomyopathy P: Volume removal per CVVH, per nephrology Hydralazine ordered as needed Lisinopril,  Pravachol on hold Could consider echocardiogram if she were to stabilize, improved  Acute kidney injury from tubular necrosis -baseline creatinine of 0.73 from 12/02/2018 Hypophosphatemia  P: Continue CVVHD, unlikely to obtain a renal recovery Follow BMP Replace electrolytes as indicated  Type 2 Diabetes P: Sliding-scale insulin as ordered  Thrombocytopenia, improved -secondary to sepsis, medications and consumptive  P: Follow CBC No evidence of active bleeding Tolerating heparin  Moderate to  Severe Protein Calorie Malnutrition  P: TPN as ordered  Acute DVT- Left IJ, subclavian, axillary  P: Heparin drip as ordered  Difficult IV Access -L chest port accessed  -DVT of L IJ P: IR aware of potential need for perm cath > await surgical prognostication before placement   Hypothyroidism P: IV Synthroid as ordered  Best practice:  Diet: TPN. DVT prophylaxis: SCDs,  IV hep GI prophylaxis: Protonix Mobility: Bedrest Code Status: Full code Disposition: ICU.  Need clear goals of care d/w family as pt is in [facility] and will, at a minimum, require LTACH post hospitalization, possibly including permanent dialysis & tracheostomy.   Family discussions ongoing with palliative care, CCS.  Labs:   CMP Latest Ref Rng & Units 12/16/2018 12/15/2018 12/15/2018  Glucose 70 - 99 mg/dL 114(H) 163(H) 113(H)  BUN 8 - 23 mg/dL 45(H) 44(H) 44(H)  Creatinine 0.44 - 1.00 mg/dL 0.99 1.04(H) 1.06(H)  Sodium 135 - 145 mmol/L 137 138 137  Potassium 3.5 - 5.1 mmol/L 4.1 4.0 4.1  Chloride 98 - 111 mmol/L 104 100 103  CO2 22 - 32 mmol/L 26 26 25   Calcium 8.9 - 10.3 mg/dL 7.6(L) 7.1(L) 7.5(L)  Total Protein 6.5 - 8.1 g/dL 6.0(L) - -  Total Bilirubin 0.3 - 1.2 mg/dL 0.4 - -  Alkaline Phos 38 - 126 U/L 225(H) - -  AST 15 - 41 U/L 24 - -  ALT 0 - 44 U/L 22 - -   CBC Latest Ref Rng & Units 12/16/2018 12/15/2018 12/14/2018  WBC 4.0 - 10.5 K/uL 22.4(H) 19.9(H) 23.2(H)  Hemoglobin 12.0 - 15.0 g/dL 6.7(LL) 7.5(L) 8.9(L)  Hematocrit 36.0 - 46.0 % 21.6(L) 24.1(L) 28.4(L)  Platelets 150 - 400 K/uL 135(L) 108(L) 102(L)    CBG (last 3)  Recent Labs    12/15/18 1740 12/15/18 2326 12/16/18 0625  GLUCAP 118* 111* 105*    Independent CC time 31 minutes  Baltazar Apo, MD, PhD 12/16/2018, 8:37 AM  Pulmonary and Critical Care [phone] or if no answer [phone]

## 2018-12-16 NOTE — Progress Notes (Signed)
Daily Progress Note   Patient Name: Carmen Stone       Date: 12/16/2018 DOB: Mar 04, 1956  Age: 63 y.o. MRN#: [id number] Attending Physician: Nolon Nations, MD Primary Care Physician: Jettie Booze, NP Admit Date: 11/29/2018  Reason for Consultation/Follow-up: Establishing goals of care  Subjective: Met today with Iona Beard at the bedside after initial conversation with him on the phone.   Discussed surgery evaluation today and he reports that he feels it is "great news" that plan is to return to OR tomorrow.  We discused that further surgical intervention will not resolve her underling renal failure, vent dependence, and fact that she has very little small bowel remaining.  Also discussed that plan for continued aggressive intervention would include recommendation for tracheostomy.     Length of Stay: 25  Current Medications: Scheduled Meds:  . sodium chloride   Intravenous Once  . chlorhexidine gluconate (MEDLINE KIT)  15 mL Mouth Rinse BID  . Chlorhexidine Gluconate Cloth  6 each Topical Daily  . dorzolamide-timolol  1 drop Both Eyes BID  . insulin aspart  3-9 Units Subcutaneous Q6H  . levothyroxine  12.5 mcg Intravenous Daily  . mouth rinse  15 mL Mouth Rinse 10 times per day  . pantoprazole (PROTONIX) IV  40 mg Intravenous Q24H    Continuous Infusions: .  prismasol BGK 4/2.5 400 mL/hr at 12/16/18 1623  .  prismasol BGK 4/2.5 200 mL/hr at 12/16/18 1623  . sodium chloride Stopped (12/12/18 1005)  . sodium chloride Stopped (12/11/18 1655)  . anidulafungin 100 mg (12/16/18 1545)  . fentaNYL infusion INTRAVENOUS 50 mcg/hr (12/16/18 0700)  . heparin 1,500 Units/hr (12/16/18 1600)  . norepinephrine (LEVOPHED) Adult infusion Stopped (12/15/18 0400)  . piperacillin-tazobactam (ZOSYN)  IV  Stopped (12/16/18 1159)  . prismasol BGK 4/2.5 1,500 mL/hr at 12/16/18 1623  . TPN ADULT (ION) 75 mL/hr at 12/16/18 1724    PRN Meds: sodium chloride, sodium chloride, albuterol, alteplase, fentaNYL (SUBLIMAZE) injection, heparin, hydrALAZINE, metoprolol tartrate, sodium chloride, sodium chloride flush  Physical Exam         General: Intubated.  Less awake today. Heart: Regular rate and rhythm. No murmur appreciated. Lungs: Good air movement  Abdomen: Vac in place.  Ext: No significant edema Skin: Warm and dry  Vital Signs: BP (!) 120/47 (BP Location: Left  Leg)   Pulse 100   Temp 99 F (37.2 C) (Bladder)   Resp 20   Ht _0  (1.626 m)   Wt 67.1 kg   SpO2 99%   BMI 25.39 kg/m  SpO2: SpO2: 99 % O2 Device: O2 Device: Ventilator O2 Flow Rate: O2 Flow Rate (L/min): 4 L/min  Intake/output summary:   Intake/Output Summary (Last 24 hours) at 12/16/2018 1808 Last data filed at 12/16/2018 1600 Gross per 24 hour  Intake 2985.43 ml  Output 3617 ml  Net -631.57 ml   LBM: Last BM Date: 12/12/18 Baseline Weight: Weight: 74.8 kg Most recent weight: Weight: 67.1 kg       Palliative Assessment/Data:    Flowsheet Rows     Most Recent Value  Intake Tab  Referral Department  Surgery  Unit at Time of Referral  ICU  Palliative Care Primary Diagnosis  Sepsis/Infectious Disease  Date Notified  12/02/2018  Palliative Care Type  New Palliative care  Reason for referral  Clarify Goals of Care  Date of Admission  11/20/2018  Date first seen by Palliative Care  12/14/18  # of days Palliative referral response time  1 Day(s)  # of days IP prior to Palliative referral  22  Clinical Assessment  Palliative Performance Scale Score  10%  Psychosocial & Spiritual Assessment  Palliative Care Outcomes  Palliative Care Outcomes  Clarified goals of care      Patient Active Problem List   Diagnosis Date Noted  . Acute respiratory failure with hypoxia and hypercapnia ([place]) 12/12/2018  . Toxic  metabolic encephalopathy 84/13/2440  . Thrombocytopenia, acquired ([place]) 12/12/2018  . Small bowel perforation ([place])   . Pressure injury of skin 11/29/2018  . Encounter for central line placement   . Septic shock ([place])   . AKI (acute kidney injury) ([place])   . S/P dialysis catheter insertion ([place])   . 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 Perforated viscus 12/06/2018  . Port-A-Cath in place 08/03/2017  . Port catheter in place 01/31/2016  . Hypersensitivity reaction 02/08/2014  . Drug induced neutropenia(288.03) 01/30/2014  . Nausea with vomiting 11/04/2013  . 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 ATN (acute tubular necrosis) 11/02/2013  . Acute respiratory failure ([place]) 11/02/2013  . Acute respiratory failure with hypoxia ([place]) 10/25/2013  . Abnormal urinalysis 10/25/2013  . Postoperative anemia due to acute blood loss 10/24/2013  . Breast abscess of female 10/22/2013  . HYPERLIPIDEMIA 07/16/2010  . ATELECTASIS 07/16/2010  . Type 2 diabetes mellitus ([place]) 06/24/2010  . 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 HYPOXEMIA 06/24/2010  . SMALL BOWEL OBSTRUCTION, HX OF 06/24/2010    Palliative Care Assessment & Plan   Patient Profile: 63 y.o. female  with past medical history of  admitted on 11/20/2018 with abdominal pain from perforation and complicated clinical course with multiple surgeries requiring resection of large portion of small bowel, code blue, and multisystem organ failure (EF 20%, on CRRT and vent dependent).     Assessment: Patient Active Problem List   Diagnosis Date Noted  . Acute respiratory failure with hypoxia and hypercapnia ([place]) 12/12/2018  . Toxic metabolic encephalopathy 04/12/2535  . Thrombocytopenia, acquired ([place]) 12/12/2018  . Small bowel perforation ([place])   . Pressure injury of skin 11/29/2018  . Encounter for central line placement   . Septic shock ([place])   . AKI (acute kidney injury) ([place])   . S/P  dialysis catheter insertion ([place])   . Small intestinal perforation with  gangrene s/p LOA/SB resection 12/14/2018 11/22/2018  . Perforated viscus 12/12/2018  . Port-A-Cath in place 08/03/2017  . Port catheter in place 01/31/2016  . Hypersensitivity reaction 02/08/2014  . Drug induced neutropenia(288.03) 01/30/2014  . Nausea with vomiting 11/04/2013  . 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 ATN (acute tubular necrosis) 11/02/2013  . Acute respiratory failure ([place]) 11/02/2013  . Acute respiratory failure with hypoxia ([place]) 10/25/2013  . Abnormal urinalysis 10/25/2013  . Postoperative anemia due to acute blood loss 10/24/2013  . Breast abscess of female 10/22/2013  . HYPERLIPIDEMIA 07/16/2010  . ATELECTASIS 07/16/2010  . Type 2 diabetes mellitus ([place]) 06/24/2010  . 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 HYPOXEMIA 06/24/2010  . SMALL BOWEL OBSTRUCTION, HX OF 06/24/2010    Recommendations/Plan:  Discussed again with patient's brother/HCPOA regarding patient's clinical course over the last 24 hours.  He would like for her to return to OR tomorrow as planned.  Discussed concerns regarding poor prognosis regardless of surgical intervention.  He remains hopeful that she will improve and remains open to all offered interventions.  Began discussion regarding tracheostomy, but he would like to see how surgery goes tomorrow prior to further conversation regarding trach.   Code Status:    Code Status Orders  (From admission, onward)         Start     Ordered   11/30/2018 1515  Full code  Continuous     12/11/2018 1515        Code Status History    Date Active Date Inactive Code Status Order ID Comments User Context   12/04/2018 1512 11/22/2018 1515 Full Code [id number]  Chesley Mires, MD Inpatient   Advance Care Planning Activity       Prognosis:   Poor  Discharge Planning:  To Be Determined  Care plan was discussed with brother, bedside RN  Thank you for allowing the Palliative  Medicine Team to assist in the care of this patient.   Time In: 1600 Time Out: 1650 Total Time 50 Prolonged Time Billed No      Greater than 50%  of this time was spent counseling and coordinating care related to the above assessment and plan.  Micheline Rough, MD  Please contact Palliative Medicine Team phone at [phone] for questions and concerns.

## 2018-12-17 ENCOUNTER — Inpatient Hospital Stay (HOSPITAL_COMMUNITY): Payer: BLUE CROSS/BLUE SHIELD | Admitting: Certified Registered"

## 2018-12-17 ENCOUNTER — Inpatient Hospital Stay (HOSPITAL_COMMUNITY): Payer: BLUE CROSS/BLUE SHIELD

## 2018-12-17 ENCOUNTER — Encounter (HOSPITAL_COMMUNITY): Admission: EM | Disposition: E | Payer: Self-pay | Source: Home / Self Care

## 2018-12-17 HISTORY — PX: APPLICATION OF WOUND VAC: SHX5189

## 2018-12-17 HISTORY — PX: ABDOMINAL WOUND DEHISCENCE: SHX540

## 2018-12-17 LAB — GLUCOSE, CAPILLARY
Glucose-Capillary: 145 mg/dL — ABNORMAL HIGH (ref 70–99)
Glucose-Capillary: 71 mg/dL (ref 70–99)
Glucose-Capillary: 94 mg/dL (ref 70–99)
Glucose-Capillary: 126 mg/dL — ABNORMAL HIGH (ref 70–99)

## 2018-12-17 LAB — RENAL FUNCTION PANEL
Albumin: 1.3 g/dL — ABNORMAL LOW (ref 3.5–5.0)
Albumin: 1.4 g/dL — ABNORMAL LOW (ref 3.5–5.0)
Anion gap: 6 (ref 5–15)
Anion gap: 10 (ref 5–15)
BUN: 48 mg/dL — ABNORMAL HIGH (ref 8–23)
BUN: 57 mg/dL — ABNORMAL HIGH (ref 8–23)
CO2: 27 mmol/L (ref 22–32)
CO2: 22 mmol/L (ref 22–32)
Calcium: 7.5 mg/dL — ABNORMAL LOW (ref 8.9–10.3)
Calcium: 6.8 mg/dL — ABNORMAL LOW (ref 8.9–10.3)
Chloride: 104 mmol/L (ref 98–111)
Chloride: 105 mmol/L (ref 98–111)
Creatinine, Ser: 1 mg/dL (ref 0.44–1.00)
Creatinine, Ser: 1.18 mg/dL — ABNORMAL HIGH (ref 0.44–1.00)
GFR calc Af Amer: >60 mL/min (ref >60)
GFR calc Af Amer: 56 mL/min — ABNORMAL LOW (ref >60)
GFR calc non Af Amer: 59 mL/min — ABNORMAL LOW (ref >60)
GFR calc non Af Amer: 49 mL/min — ABNORMAL LOW (ref >60)
Glucose, Bld: 118 mg/dL — ABNORMAL HIGH (ref 70–99)
Glucose, Bld: 133 mg/dL — ABNORMAL HIGH (ref 70–99)
Phosphorus: 2.1 mg/dL — ABNORMAL LOW (ref 2.5–4.6)
Phosphorus: 3.7 mg/dL (ref 2.5–4.6)
Potassium: 4.2 mmol/L (ref 3.5–5.1)
Potassium: 4 mmol/L (ref 3.5–5.1)
Sodium: 137 mmol/L (ref 135–145)
Sodium: 137 mmol/L (ref 135–145)

## 2018-12-17 LAB — CBC
HCT: 20.7 % — ABNORMAL LOW (ref 36.0–46.0)
Hemoglobin: 6.7 g/dL — CL (ref 12.0–15.0)
MCH: 30.5 pg (ref 26.0–34.0)
MCHC: 32.4 g/dL (ref 30.0–36.0)
MCV: 94.1 fL (ref 80.0–100.0)
Platelets: 160 10*3/uL (ref 150–400)
RBC: 2.2 MIL/uL — ABNORMAL LOW (ref 3.87–5.11)
RDW: 15.6 % — ABNORMAL HIGH (ref 11.5–15.5)
WBC: 21 10*3/uL — ABNORMAL HIGH (ref 4.0–10.5)
nRBC: 0.4 % — ABNORMAL HIGH (ref 0.0–0.2)

## 2018-12-17 LAB — PREPARE RBC (CROSSMATCH)

## 2018-12-17 LAB — MAGNESIUM: Magnesium: 2.2 mg/dL (ref 1.7–2.4)

## 2018-12-17 SURGERY — REPAIR, DEHISCENCE, WOUND, ABDOMEN
Anesthesia: General | Site: Abdomen

## 2018-12-17 MED ORDER — ONDANSETRON HCL 4 MG/2ML IJ SOLN
INTRAMUSCULAR | Status: DC | PRN
  Administered 2018-12-17: 4 mg via INTRAVENOUS

## 2018-12-17 MED ORDER — PROPOFOL 10 MG/ML IV BOLUS
INTRAVENOUS | Status: DC | PRN
  Administered 2018-12-17: 40 mg via INTRAVENOUS

## 2018-12-17 MED ORDER — MIDAZOLAM HCL 5 MG/5ML IJ SOLN
INTRAMUSCULAR | Status: DC | PRN
  Administered 2018-12-17: 2 mg via INTRAVENOUS

## 2018-12-17 MED ORDER — EPHEDRINE 5 MG/ML INJ
INTRAVENOUS | Status: AC
  Filled 2018-12-17: qty 10

## 2018-12-17 MED ORDER — HEPARIN (PORCINE) 25000 UT/250ML-% IV SOLN
1500.0000 [IU]/h | INTRAVENOUS | Status: DC
  Administered 2018-12-17: 1500 [IU]/h via INTRAVENOUS
  Filled 2018-12-17: qty 250

## 2018-12-17 MED ORDER — SODIUM CHLORIDE 0.9 % IV SOLN
INTRAVENOUS | Status: DC | PRN
  Administered 2018-12-17: 15 ug/min via INTRAVENOUS
  Administered 2018-12-17: 50 ug/min via INTRAVENOUS

## 2018-12-17 MED ORDER — SODIUM CHLORIDE 0.9 % IR SOLN
Status: DC | PRN
  Administered 2018-12-17: 6000 mL

## 2018-12-17 MED ORDER — PROPOFOL 10 MG/ML IV BOLUS
INTRAVENOUS | Status: AC
  Filled 2018-12-17: qty 20

## 2018-12-17 MED ORDER — TRACE MINERALS CR-CU-MN-SE-ZN 10-1000-500-60 MCG/ML IV SOLN
INTRAVENOUS | Status: AC
  Administered 2018-12-17: 19:00:00 via INTRAVENOUS
  Filled 2018-12-17: qty 1320

## 2018-12-17 MED ORDER — FENTANYL CITRATE (PF) 100 MCG/2ML IJ SOLN
INTRAMUSCULAR | Status: AC
  Filled 2018-12-17: qty 2

## 2018-12-17 MED ORDER — MIDAZOLAM HCL 2 MG/2ML IJ SOLN
INTRAMUSCULAR | Status: AC
  Filled 2018-12-17: qty 2

## 2018-12-17 MED ORDER — ROCURONIUM BROMIDE 10 MG/ML (PF) SYRINGE
PREFILLED_SYRINGE | INTRAVENOUS | Status: DC | PRN
  Administered 2018-12-17 (×2): 50 mg via INTRAVENOUS

## 2018-12-17 MED ORDER — SODIUM PHOSPHATES 45 MMOLE/15ML IV SOLN
10.0000 mmol | Freq: Once | INTRAVENOUS | Status: AC
  Administered 2018-12-17: 10 mmol via INTRAVENOUS
  Filled 2018-12-17: qty 3.33

## 2018-12-17 MED ORDER — SODIUM CHLORIDE 0.9% IV SOLUTION: Freq: Once | INTRAVENOUS | Status: DC

## 2018-12-17 MED ORDER — FENTANYL CITRATE (PF) 100 MCG/2ML IJ SOLN
INTRAMUSCULAR | Status: DC | PRN
  Administered 2018-12-17 (×2): 50 ug via INTRAVENOUS

## 2018-12-17 MED ORDER — PHENYLEPHRINE 40 MCG/ML (10ML) SYRINGE FOR IV PUSH (FOR BLOOD PRESSURE SUPPORT)
PREFILLED_SYRINGE | INTRAVENOUS | Status: DC | PRN
  Administered 2018-12-17 (×4): 120 ug via INTRAVENOUS
  Administered 2018-12-17: 160 ug via INTRAVENOUS

## 2018-12-17 MED ORDER — LIDOCAINE 2% (20 MG/ML) 5 ML SYRINGE
INTRAMUSCULAR | Status: AC
  Filled 2018-12-17: qty 5

## 2018-12-17 MED ORDER — PHENYLEPHRINE 40 MCG/ML (10ML) SYRINGE FOR IV PUSH (FOR BLOOD PRESSURE SUPPORT)
PREFILLED_SYRINGE | INTRAVENOUS | Status: AC
  Filled 2018-12-17: qty 10

## 2018-12-17 MED ORDER — ROCURONIUM BROMIDE 10 MG/ML (PF) SYRINGE
PREFILLED_SYRINGE | INTRAVENOUS | Status: AC
  Filled 2018-12-17: qty 10

## 2018-12-17 SURGICAL SUPPLY — 20 items
COVER WAND RF STERILE (DRAPES) IMPLANT
DRAPE LAPAROSCOPIC ABDOMINAL (DRAPES) ×3 IMPLANT
DRESSING VAC ATS ABD (GAUZE/BANDAGES/DRESSINGS) ×2 IMPLANT
ELECT REM PT RETURN 15FT ADLT (MISCELLANEOUS) ×3 IMPLANT
GLOVE BIOGEL PI IND STRL 7.0 (GLOVE) ×1 IMPLANT
GLOVE BIOGEL PI INDICATOR 7.0 (GLOVE) ×2
GLOVE SURG SIGNA 7.5 PF LTX (GLOVE) ×3 IMPLANT
GOWN STRL REUS W/TWL LRG LVL3 (GOWN DISPOSABLE) ×3 IMPLANT
GOWN STRL REUS W/TWL XL LVL3 (GOWN DISPOSABLE) ×4 IMPLANT
HANDLE SUCTION POOLE (INSTRUMENTS) IMPLANT
KIT BASIN OR (CUSTOM PROCEDURE TRAY) ×3 IMPLANT
KIT TURNOVER KIT A (KITS) IMPLANT
NS IRRIG 1000ML POUR BTL (IV SOLUTION) ×5 IMPLANT
PACK GENERAL/GYN (CUSTOM PROCEDURE TRAY) ×3 IMPLANT
SHEARS HARMONIC ACE PLUS 36CM (ENDOMECHANICALS) IMPLANT
SPONGE ABD ABTHERA ADVANCE (MISCELLANEOUS) ×2 IMPLANT
SPONGE LAP 18X18 RF (DISPOSABLE) ×4 IMPLANT
STAPLER VISISTAT 35W (STAPLE) ×1 IMPLANT
SUCTION POOLE HANDLE (INSTRUMENTS) ×3
TOWEL OR 17X26 10 PK STRL BLUE (TOWEL DISPOSABLE) ×3 IMPLANT

## 2018-12-17 NOTE — Op Note (Addendum)
  Carmen Stone 01/02/2019   Pre-op Diagnosis: open abdomen, small bowel perforation     Post-op Diagnosis: same  Procedure(s): EXPLORATORY LAPAROTOMY, [place] OUT OF ABDOMEN APPLICATION OF WOUND VAC (Ab[place]ra)  Surgeon(s): Coralie Keens, MD  Anesthesia: General  Staff:  Circulator: Rayetta Humphrey, RN Scrub Person: Joellen Jersey, CST  Estimated Blood Loss: Minimal               Findings: There was a moderate amount of hematoma and fibrinous exudate in the abdominal cavity was removed.  The remaining small bowel appeared viable and I could see no obvious leak of bile.  There was some central necrosis in the mesentery the bowel but I cannot see any definite hole for opening of the colon.  I elected not to reanastomose the small bowel given the amount of hematoma.  A wound VAC was replaced  Procedure: The patient was brought still intubated from the intensive care unit.  She is placed upon the operating table and general anesthesia was induced.  Her wound VAC was then completely removed including sponges.  Upon entering the abdomen there was a large hematoma centrally as well as mild to moderate free fluid.  I removed the hematoma and fibrinous exudate.  I could not identify the small bowel.  I identified the Prolene sutures in the 2 separate segments of small bowel.  I can see no obvious leak from these areas.  There was some central necrotic debris of the mesentery centrally in the abdomen.  This was fairly fixated but I was able to remove some.  I can see no other openings in the small or large intestines.  At this point, I irrigated the abdomen with multiple liters of normal saline.  I can see no area of bleeding or even oozing.  At this point, I elected to again place a abthera wound VAC and leave the abdomen open.  The VAC and sponges were placed and secured in place with adhesive.  It was then placed to suction and good suction was achieved.  The patient was then taken back  still intubated to the intensive care unit.  Will be to return to the operating room for another washout on Monday.          Coralie Keens   Date: 01/08/2019  Time: 8:30 AM

## 2018-12-17 NOTE — Progress Notes (Signed)
[facility] for IV heparin Indication: IJ DVT  No Known Allergies  Patient Measurements: Height: 5\' 4"  (162.6 cm) Weight: 148 lb 9.4 oz (67.4 kg) IBW/kg (Calculated) : 54.7 Heparin Dosing Weight: 71 kg  Vital Signs: Temp: 97.5 F (36.4 C) (07/03 0600) BP: 106/52 (07/03 0600) Pulse Rate: 85 (07/03 0841)  Labs: Recent Labs    12/15/18 0522  12/16/18 0600 12/16/18 1805 12/16/18 1806 12/18/2018 0419  HGB 7.5*  --  6.7*  --  7.2* 6.7*  HCT 24.1*  --  21.6*  --  22.8* 20.7*  PLT 108*  --  135*  --   --  160  HEPARINUNFRC 0.47  --  0.71*  --  0.59  --   CREATININE 1.06*   < > 0.99 1.02*  --  1.00   < > = values in this interval not displayed.    Estimated Creatinine Clearance: 53.7 mL/min (by C-G formula based on SCr of 1 mg/dL).  Assessment: 89 yoF known to pharmacy from TPN and antibiotic dosing; complicated hospital course including cardiac arrest, intubation, VAP, and CRRT, now further complicated with extensive IJ clot (only L sided catheter removed 6/9). Pharmacy to dose heparin infusion.   Baseline INR, aPTT: baseline INR/aPTT mildly elevated but did have recent acute liver injury  Prior anticoagulation: none  Today, 01/11/2019: Heparin off since MN for OR.  S/p OR today for wash out of abd and replacement of wound VAC.  Moderate amount of hematoma & fibrous exudate removed from abd cavity by CCS.Marland Kitchen CRRT continues. Hg down to 6.7 after 1 U PRBC yesterday PLTC up to 160.  Heparin level tx last night at 0.59 after drip decreased to 1500 units/hr.  Per CCS and CCM heparin to resume tonight at 1700 with no bolus.   Goal of Therapy: Heparin level 0.3-0.7 units/ml Monitor platelets by anticoagulation protocol: Yes  Plan:  Resume heparin today at 1700 at prior rate of heparin 1500 units/hr with no bolus and check 8 hr HL  Daily CBC and heparin level  Monitor for signs of bleeding or thrombosis  Eudelia Bunch,  Pharm.D [phone] 12/29/2018 9:17 AM

## 2018-12-17 NOTE — Anesthesia Postprocedure Evaluation (Signed)
Anesthesia Post Note  Patient: Carmen Stone  Procedure(s) Performed: [facility] OUT OF ABDOMEN (N/A Abdomen) APPLICATION OF WOUND VAC (N/A Abdomen)     Patient location during evaluation: SICU Anesthesia Type: General Level of consciousness: sedated Pain management: pain level controlled Vital Signs Assessment: post-procedure vital signs reviewed and stable Respiratory status: patient remains intubated per anesthesia plan Cardiovascular status: stable Postop Assessment: no apparent nausea or vomiting Anesthetic complications: no    Last Vitals:  Vitals:   12/26/2018 0600 01/08/2019 0841  BP: (!) 106/52   Pulse:  85  Resp: 16 (!) 22  Temp: (!) 36.4 C   SpO2:  100%    Last Pain:  Vitals:   12/16/18 1600  TempSrc: Bladder  PainSc:                  Carmen Stone DANIEL

## 2018-12-17 NOTE — Progress Notes (Signed)
[facility] Progress Note Patient Name: Carmen Stone DOB: 1955-11-16 MRN: [id number]   Date of Service  12/30/2018  HPI/Events of Note  Second drop in Hgb from 7.2 to 6.7.  Is on heparin for CRRT.  Received 1 unit pRBC yesterday for similar episode.    eICU Interventions  Plan: Transfuse 1 unit pRBC Post-transfusion CBC Consider CRRT without heparin infusion.     Intervention Category Intermediate Interventions: Other:  DETERDING,ELIZABETH 01/10/2019, 5:13 AM

## 2018-12-17 NOTE — Progress Notes (Signed)
[facility] ROUNDING NOTE   Subjective:   This is a 63 year old lady with acute kidney injury last baseline 0.79 06/04/2018.  She had acute oliguric acute tubular necrosis in the setting of septic shock from bowel perforation.  Was on CRRT from 6/ 9  to  6/12 was held after cardiac arrest.  She resumed 12/06/2018 after abdominal closure.  Still no signs of renal recovery.  Status post small bowel perforation due to closed-loop SBO and septic shock followed by CCM underwent closure 11/29/2018 new recent bowel leak due to nonhealing anastomosis.  Went back to the OR 12/14/2018 for resection of anastomosis.  She went back to the OR 11/19/2018.  She has a large wound VAC.  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  No history of diabetes mellitus type 2.  Urine output 74 cc 12/16/2018.  Has been kept even on CRRT  Blood pressure 111/61 pulse 100 temperature 97 O2 sats 100% 30% FiO2  Sodium 137 potassium 4.2 chloride 104 CO2 27 BUN 48 creatinine 1.0 glucose 118 calcium 7.5 phosphorus 2.1 albumin 1.3 magnesium 2.2 WBC 21.0 hemoglobin 6.7 platelets 160  IV heparin IV TPN  IV Zosyn 3.375 g IV eraxis   CT scan abdomen 12/09/2018 shows large amount of intraperitoneal free fluid which is progressed to large volumes ascites.  Prominent intraperitoneal free air collection under the anterior abdominal wall with gas scattered in the central small bowel mesentery.  Patchy airspace disease.  Levothyroxine 12.5 mcg IV daily Protonix 40 mg IV every 24 hours   Objective:  Vital signs in last 24 hours:  Temp:  [96.8 F (36 C)-99.9 F (37.7 C)] 97.5 F (36.4 C) (07/03 0600) Pulse Rate:  [85-104] 85 (07/03 0841) Resp:  [16-27] 22 (07/03 0841) BP: (104-179)/(38-69) 106/52 (07/03 0600) SpO2:  [99 %-100 %] 100 % (07/03 0841) FiO2 (%):  [30 %] 30 % (07/03 0841) Weight:  [67.4 kg] 67.4 kg (07/03 0500)  Weight change: 0.3 kg Filed Weights   12/15/18 0600 12/16/18  0500 12/23/2018 0500  Weight: 67.1 kg 67.1 kg 67.4 kg    Intake/Output: I/O last 3 completed shifts: In: 4230.6 [I.V.:3237.8; Blood:523; IV Piggyback:469.7] Out: 0867 [Urine:146; Emesis/NG output:880; Drains:1100; Other:3285]   Intake/Output this shift:  Total I/O In: 462.5 [I.V.:462.5] Out: 40 [Blood:40] Intubated and sedated making grimacing faces. CVS- RRR no JVP RS- CTA intubated with rhonchorous breath sounds ABD- BS present soft non-distended EXT-1-2+ lower extremity edema   Basic Metabolic Panel: Recent Labs  Lab 12/12/2018 0500  12/14/18 0551  12/15/18 0522 12/15/18 1553 12/16/18 0600 12/16/18 1805 12/28/2018 0419  NA 136  137   < > 138   < > 137 138 137 138 137  K 4.2  3.7   < > 4.1   < > 4.1 4.0 4.1 4.3 4.2  CL 103  104   < > 104   < > 103 100 104 105 104  CO2 25  25   < > 27   < > '25 26 26 26 27  ' GLUCOSE 119*  92   < > 113*   < > 113* 163* 114* 122* 118*  BUN 55*  45*   < > 47*   < > 44* 44* 45* 48* 48*  CREATININE 1.51*  1.14*   < > 1.15*   < > 1.06* 1.04* 0.99 1.02* 1.00  CALCIUM 7.6*  7.3*   < > 7.5*   < > 7.5* 7.1* 7.6* 7.6* 7.5*  MG 2.3  --  2.1  --  2.2  --  2.2  --  2.2  PHOS 3.5   < > 2.5  2.5   < > 1.9* 3.0 2.2* 2.6 2.1*   < > = values in this interval not displayed.    Liver Function Tests: Recent Labs  Lab 12/07/2018 0500  12/15/18 0522 12/15/18 1553 12/16/18 0600 12/16/18 1805 12/30/2018 0419  AST 30  --   --   --  24  --   --   ALT 27  --   --   --  22  --   --   ALKPHOS 217*  --   --   --  225*  --   --   BILITOT 1.0  --   --   --  0.4  --   --   PROT 5.7*  --   --   --  6.0*  --   --   ALBUMIN 1.5*  1.4*   < > 1.3* 1.3* 1.4* 1.3* 1.3*   < > = values in this interval not displayed.   No results for input(s): LIPASE, AMYLASE in the last 168 hours. No results for input(s): AMMONIA in the last 168 hours.  CBC: Recent Labs  Lab 12/11/2018 0500 12/14/18 0551 12/15/18 0522 12/16/18 0600 12/16/18 1806 01/11/2019 0419  WBC 22.8*  23.2* 19.9* 22.4*  --  21.0*  NEUTROABS 17.3*  --   --   --   --   --   HGB 9.2* 8.9* 7.5* 6.7* 7.2* 6.7*  HCT 28.5* 28.4* 24.1* 21.6* 22.8* 20.7*  MCV 92.8 97.6 96.8 96.4  --  94.1  PLT 120* 102* 108* 135*  --  160    Cardiac Enzymes: No results for input(s): CKTOTAL, CKMB, CKMBINDEX, TROPONINI in the last 168 hours.  BNP: Invalid input(s): POCBNP  CBG: Recent Labs  Lab 12/16/18 0625 12/16/18 1118 12/16/18 1826 12/16/18 2348 12/16/2018 0619  GLUCAP 105* 106* 110* 71 145*    Microbiology: Results for orders placed or performed during the hospital encounter of 11/20/2018  SARS Coronavirus 2 (CEPHEID - Performed in [place] hospital lab), Hosp Order     Status: None   Collection Time: 11/24/2018  3:55 AM   Specimen: Nasopharyngeal Swab  Result Value Ref Range Status   SARS Coronavirus 2 NEGATIVE NEGATIVE Final    Comment: (NOTE) If result is NEGATIVE SARS-CoV-2 target nucleic acids are NOT DETECTED. The SARS-CoV-2 RNA is generally detectable in upper and lower  respiratory specimens during the acute phase of infection. The lowest  concentration of SARS-CoV-2 viral copies this assay can detect is 250  copies / mL. A negative result does not preclude SARS-CoV-2 infection  and should not be used as the sole basis for treatment or other  patient management decisions.  A negative result may occur with  improper specimen collection / handling, submission of specimen other  than nasopharyngeal swab, presence of viral mutation(s) within the  areas targeted by this assay, and inadequate number of viral copies  (<250 copies / mL). A negative result must be combined with clinical  observations, patient history, and epidemiological information. If result is POSITIVE SARS-CoV-2 target nucleic acids are DETECTED. The SARS-CoV-2 RNA is generally detectable in upper and lower  respiratory specimens dur ing the acute phase of infection.  Positive  results are indicative of active  infection with SARS-CoV-2.  Clinical  correlation with patient history and other diagnostic information is  necessary  to determine patient infection status.  Positive results do  not rule out bacterial infection or co-infection with other viruses. If result is PRESUMPTIVE POSTIVE SARS-CoV-2 nucleic acids MAY BE PRESENT.   A presumptive positive result was obtained on the submitted specimen  and confirmed on repeat testing.  While 2019 novel coronavirus  (SARS-CoV-2) nucleic acids may be present in the submitted sample  additional confirmatory testing may be necessary for epidemiological  and / or clinical management purposes  to differentiate between  SARS-CoV-2 and other Sarbecovirus currently known to infect humans.  If clinically indicated additional testing with an alternate test  methodology [phone]) is advised. The SARS-CoV-2 RNA is generally  detectable in upper and lower respiratory sp ecimens during the acute  phase of infection. The expected result is Negative. Fact Sheet for Patients:  StrictlyIdeas.no Fact Sheet for Healthcare Providers: BankingDealers.co.za This test is not yet approved or cleared by the [place] FDA and has been authorized for detection and/or diagnosis of SARS-CoV-2 by FDA under an Emergency Use Authorization (EUA).  This EUA will remain in effect (meaning this test can be used) for the duration of the COVID-19 declaration under Section 564(b)(1) of the Act, 21 U.S.C. section 360bbb-3(b)(1), unless the authorization is terminated or revoked sooner. Performed at [facility], [place] [address], [place], [place] [address]   MRSA PCR Screening     Status: None   Collection Time: 11/22/18 10:14 AM   Specimen: Nasal Mucosa; Nasopharyngeal  Result Value Ref Range Status   MRSA by PCR NEGATIVE NEGATIVE Final    Comment:        The GeneXpert MRSA Assay (FDA approved for NASAL  specimens only), is one component of a comprehensive MRSA colonization surveillance program. It is not intended to diagnose MRSA infection nor to guide or monitor treatment for MRSA infections. Performed at [facility], [place] [address], [place], [place] [address]   Culture, respiratory (non-expectorated)     Status: None   Collection Time: 12/05/18 11:25 AM   Specimen: Tracheal Aspirate; Respiratory  Result Value Ref Range Status   Specimen Description   Final    TRACHEAL ASPIRATE Performed at [place] [address], [place], [place] [address]    Special Requests   Final    NONE Performed at [facility], [place] [address] [place], [place] [address]    Gram Stain   Final    RARE WBC PRESENT, PREDOMINANTLY PMN NO ORGANISMS SEEN Performed at [facility], [place] [address] [place], [place] [address]    Culture RARE ESCHERICHIA COLI  Final   Report Status 12/07/2018 FINAL  Final   Organism ID, Bacteria ESCHERICHIA COLI  Final      Susceptibility   Escherichia coli - MIC*    AMPICILLIN >=32 RESISTANT Resistant     CEFAZOLIN <=4 SENSITIVE Sensitive     CEFEPIME <=1 SENSITIVE Sensitive     CEFTAZIDIME <=1 SENSITIVE Sensitive     CEFTRIAXONE <=1 SENSITIVE Sensitive     CIPROFLOXACIN <=0.25 SENSITIVE Sensitive     GENTAMICIN <=1 SENSITIVE Sensitive     IMIPENEM <=0.25 SENSITIVE Sensitive     TRIMETH/SULFA <=20 SENSITIVE Sensitive     AMPICILLIN/SULBACTAM >=32 RESISTANT Resistant     PIP/TAZO <=4 SENSITIVE Sensitive     Extended ESBL NEGATIVE Sensitive     * RARE ESCHERICHIA COLI  Culture, blood (routine x 2)     Status: None   Collection Time: 12/07/18  10:50 AM   Specimen: BLOOD LEFT ARM  Result Value Ref Range Status   Specimen Description   Final    BLOOD LEFT ARM Performed at [facility], [address] [place], [place] [address]    Special Requests   Final    BOTTLES DRAWN AEROBIC AND ANAEROBIC  Blood Culture adequate volume Performed at [place] [address] [place], [place] [address]    Culture   Final    NO GROWTH 5 DAYS Performed at [facility], [place] [address] [place], [place] [address]    Report Status 12/12/2018 FINAL  Final  Culture, blood (routine x 2)     Status: None   Collection Time: 12/06/2018 12:24 PM   Specimen: BLOOD  Result Value Ref Range Status   Specimen Description   Final    BLOOD RIGHT ARM Performed at [place] [address], [place], [place] [address]    Special Requests   Final    BOTTLES DRAWN AEROBIC AND ANAEROBIC Blood Culture adequate volume Performed at [place] [address], [place], [place] [address]    Culture   Final    NO GROWTH 5 DAYS Performed at [facility], [place] [address] [place], [place] [address]    Report Status 12/15/2018 FINAL  Final  Culture, blood (routine x 2)     Status: None   Collection Time: 12/07/2018  1:03 PM   Specimen: BLOOD  Result Value Ref Range Status   Specimen Description   Final    BLOOD RIGHT ANTECUBITAL Performed at [place] [facility] [address] [place], [place] [address]    Special Requests   Final    BOTTLES DRAWN AEROBIC ONLY Blood Culture adequate volume Performed at [place] [address] [place], [place] [address]    Culture   Final    NO GROWTH 5 DAYS Performed at [facility], [place] [address] [place], [place] [address]    Report Status 12/15/2018 FINAL  Final    Coagulation Studies: No results for input(s): LABPROT, INR in the last 72 hours.  Urinalysis: No results for input(s): COLORURINE, LABSPEC, PHURINE, GLUCOSEU, HGBUR, BILIRUBINUR, KETONESUR, PROTEINUR, UROBILINOGEN, NITRITE, LEUKOCYTESUR in the last 72 hours.  Invalid input(s): APPERANCEUR    Imaging: Dg Chest Port 1 View  Result Date: 01/01/2019 CLINICAL DATA:  Respiratory failure EXAM:  PORTABLE CHEST 1 VIEW COMPARISON:  December 16, 2018 FINDINGS: The heart size and mediastinal contours are stable. Endotracheal tube is identified distal tip 3 cm from carina. Bilateral central venous line and nasogastric tube are identified unchanged. Mild patchy opacity is identified in the right lung base unchanged. The left lung is clear. The visualized skeletal structures are stable. IMPRESSION: Life supporting devices are stable compared to prior exam. Stable mild patchy opacity of right lung base unchanged. Electronically Signed   By: Abelardo Diesel M.D.   On: 01/03/2019 08:53   Dg Chest Port 1 View  Result Date: 12/16/2018 CLINICAL DATA:  Respiratory failure. EXAM: PORTABLE CHEST 1 VIEW COMPARISON:  12/14/2018. FINDINGS: Endotracheal tube, NG tube, left PowerPort catheter, right IJ line, right subclavian line stable position. Heart size stable. Mild right base atelectasis/infiltrate small right pleural effusion again noted. Slight improvement in aeration. No pneumothorax. Surgical clips right axilla. IMPRESSION: 1.  Lines and tubes stable position. 2. Mild right base atelectasis/infiltrate small right pleural effusion again noted. Slight improvement in aeration from  prior exam. Electronically Signed   By: Marcello Moores  Register   On: 12/16/2018 08:03     Medications:   .  prismasol BGK 4/2.5 400 mL/hr at 12/16/18 1623  .  prismasol BGK 4/2.5 200 mL/hr at 12/16/18 1623  . sodium chloride 0 mL/hr at 12/12/18 1005  . sodium chloride 250 mL (12/22/2018 1011)  . anidulafungin Stopped (12/16/18 1725)  . fentaNYL infusion INTRAVENOUS 100 mcg/hr (01/08/2019 0751)  . heparin    . norepinephrine (LEVOPHED) Adult infusion Stopped (12/15/18 0400)  . piperacillin-tazobactam (ZOSYN)  IV 3.375 g (01/01/2019 0622)  . prismasol BGK 4/2.5 1,500 mL/hr at 12/16/18 1623  . sodium phosphate  Dextrose 5% IVPB 10 mmol (12/24/2018 1014)  . TPN ADULT (ION) 75 mL/hr at 12/26/2018 0600  . TPN ADULT (ION)     . sodium chloride    Intravenous Once  . chlorhexidine gluconate (MEDLINE KIT)  15 mL Mouth Rinse BID  . Chlorhexidine Gluconate Cloth  6 each Topical Daily  . dorzolamide-timolol  1 drop Both Eyes BID  . insulin aspart  3-9 Units Subcutaneous Q6H  . levothyroxine  12.5 mcg Intravenous Daily  . mouth rinse  15 mL Mouth Rinse 10 times per day  . pantoprazole (PROTONIX) IV  40 mg Intravenous Q24H   sodium chloride, sodium chloride, albuterol, alteplase, fentaNYL (SUBLIMAZE) injection, heparin, hydrALAZINE, metoprolol tartrate, sodium chloride, sodium chloride flush  Assessment/ Plan:   Acute kidney injury with very little signs of recovery.  She is anuric she is requiring CRRT.  Baseline serum creatinine 0.9.  Acute kidney injury following perforated small bowel with fecal peritonitis and wound debridement.  Hypotension/volumes continue to keep even.  She may be able to tolerate some volume removal.  IV pressors have been discontinued blood pressure appears to be doing fine  Electrolytes appear to be stable at this time  Acid-base balance stable at this time  Perforated small bowel with acute kidney injury 6/9 to 11/26/2018.  New recent bowel leak due to nonhealing of anastomosis went to the OR 11/29/2018 11/15/2018 appreciate assistance from Dr. Barry Dienes.  Wound VAC  Diabetes mellitus per primary team  Nutrition continues on TNA  Hypophosphatemia resolved added with TNA     LOS: [place] '@TODAY' '@10' :21 AM

## 2018-12-17 NOTE — Progress Notes (Addendum)
PHARMACY - ADULT TOTAL PARENTERAL NUTRITION CONSULT NOTE   Pharmacy Consult for TPN Indication: prolonged ileus  HPI: 76 yoF admitted 6/7 with perforated small bowel from closed-loop obstruction and underwent emergent exploratory laparotomy. Returned to OR on 6/9 and again on 6/14 for closure of abdominal wall.  Patient coded on 2022/12/26 and developed multiorgan failure. Remains intubated & on CRRT. Pressors have been weaned off and she is completing antibiotic course for sepsis/PNA.  NPO since admission on 6/7. Pharmacy consulted to start TPN 6/16. D/t acute liver injury from shock, initiated TPN at low rate & advance slowly as requested by surgery.  Patient Measurements: Height: '5\' 4"'  (162.6 cm) Weight: 148 lb 9.4 oz (67.4 kg) IBW/kg (Calculated) : 54.7 TPN AdjBW (KG): 59.7 Body mass index is 25.51 kg/m. Usual Weight: 90kg   Intake/Output Summary (Last 24 hours) at 01/14/2019 0832 Last data filed at 01/10/2019 0820 Gross per 24 hour  Intake 2896.34 ml  Output 3133 ml  Net -236.66 ml    Recent Labs    12/16/18 0600 12/16/18 1805 12/26/2018 0419  NA 137 138 137  K 4.1 4.3 4.2  CL 104 105 104  CO2 '26 26 27  ' GLUCOSE 114* 122* 118*  BUN 45* 48* 48*  CREATININE 0.99 1.02* 1.00  CALCIUM 7.6* 7.6* 7.5*  PHOS 2.2* 2.6 2.1*  MG 2.2  --  2.2  ALBUMIN 1.4* 1.3* 1.3*  ALKPHOS 225*  --   --   AST 24  --   --   ALT 22  --   --   BILITOT 0.4  --   --     Significant events:  6/18: OK to increase to goal rate per surgery 6/22: CRRT stopped for holiday; CBG 69 overnight, D50 given but TPN continued (55 units insulin in TPN); BS and BM overnight - initiating trickle feeds 6/23: resuming CRRT, return to high-protein TPN; fevers overnight, CCM removing some central lines; will use chemo port for TPN 6/24: began adding lipids to TPN formula 6/25: trickle feeds stopped d/t abd distention 6/26: CRRT clotted off overnight; patient to OR this AM for ex lap; RN attempting to restart CRRT  post-op but still having issues at this time 6/27: CRRT appears to have been successfully resumed overnight 6/29 OR pus throughout abdomen. Small hole in small bowel 7/3 OR wash out of abd, replace wound VAC, abd remains open  Central access: 11/18/2018 TPN start date: 11/30/18  ASSESSMENT                                                                                                          Current Nutrition: NPO IVF: On CRRT to keep net neutral I/O  Today:   Glucose (goal 100-150) - Hx DM on 70/30 Novolog 20 units BID PTA.  CBGs acceptable  25 units insulin in TPN + ICU resistant scale; 3 units SSI required yesterday  Electrolytes - on CRRT; WNL x phos 2.1 after 10 mM Na phos bolus yesterday  Renal - AKI; SCr, bicarb normalized on CRRT, BUN remains elevated  LFTs - albumin remains low; alk phos 225  TGs - WNL (6/30)  Prealbumin - down to 5.8 6/30, reflects inflammation and critical illness,  NUTRITIONAL GOALS                                                                                             RD recs: (6/29):  Kcal:1514 kcal, Protein:195-215 grams Fluid:>/= 1.8 L/day  PLAN                                                                                                                         Now: [place] 10 mMol At 1800 today:  Continue TPN at 75 ml/hr  custom TPN at goal rate of 75 ml/hr provides: 198 g/day protein, 1512 Kcal/day = 100% support  Electrolytes: continue to hold Mag, small K, increased Na, Cl:Ac = 1:2  TPN to contain standard multivitamins, trace elements MWF only due to national backorder.  IVF per MD  Continue ICU resistant-scale glycemic control orders q6h; continue 25 units regular insulin in TPN   TPN lab panels Mondays & Thursdays  Ordered BID renal function panels while on CRRT  Eudelia Bunch, Pharm.D 12/26/2018 8:32 AM

## 2018-12-17 NOTE — Progress Notes (Signed)
Patient manually ventilated to OR by CRNA. Vent on standby at bedside.

## 2018-12-17 NOTE — Progress Notes (Signed)
Patient ID: Carmen Stone, female   DOB: [birth year], 63 y.o.   MRN: [id number]   Pre Procedure note for inpatients:   SUHEYLA MORTELLARO has been scheduled for Procedure(s): EXPLORATORY LAPAROTOMY, SMALL BOWEL RESECTION, PLACEMENT OF WOUND VAC (N/A) today. The various methods of treatment have been discussed with the patient. After consideration of the risks, benefits and treatment options the patient has consented to the planned procedure.   The patient has been seen and labs reviewed. There are no changes in the patient's condition to prevent proceeding with the planned procedure today.  Recent labs:  Lab Results  Component Value Date   WBC 21.0 (H) 12/23/2018   HGB 6.7 (LL) 12/29/2018   HCT 20.7 (L) 12/21/2018   PLT 160 12/22/2018   GLUCOSE 118 (H) 12/29/2018   TRIG 100 12/14/2018   ALT 22 12/16/2018   AST 24 12/16/2018   NA 137 12/18/2018   K 4.2 01/06/2019   CL 104 12/31/2018   CREATININE 1.00 12/27/2018   BUN 48 (H) 01/13/2019   CO2 27 01/12/2019   TSH 5.067 (H) 11/22/2018   INR 1.6 (H) 12/05/2018   HGBA1C 11.8 (H) 10/24/2013    Coralie Keens, MD 01/12/2019 7:15 AM

## 2018-12-17 NOTE — Progress Notes (Signed)
NAME:  Carmen Stone, MRN:  [id number], DOB:  August 27, 1955, LOS: 80 ADMISSION DATE:  11/29/2018, CONSULTATION DATE:  11/21/2018 REFERRING MD:  Dr. Ninfa Linden, Surgery, CHIEF COMPLAINT:  Abdominal pain   Brief History   63 y/o female presented to ER 11/17/2018 with nausea, vomiting, abdominal pain.  CT abd/pelvis showed focal perforated loop of SB with free air with multiple complex ventral hernias.  Underwent emergent laparotomy with SB resection, lysis of adhesions and wound vac.  Remained on vent and pressors post op.  Hospital course complicated by respiratory arrest 6/13, multiple reintubations, intermittent vasopressor needs, AKI on CVVHD.     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 Changing volume goal on CRRT to -100 cc an hour. Weaned 7 hours PSV   6/16 Encephalopathic. Hypertensive, adding labetalol.  IV TNA started 6/17 A little more awake.  Tolerating PSV but SBI exceeds 105.  Volume removal with CVVHD 6/18 Neuro status improved.  Required brief vasoactive drips.  Net negative fluid volume status.PSV 6/19 Extubated 6/20 reintubated for altered mentation, head CT negative  6/21 Tx PRBC x1.  Off vasopressors 6/23 Febrile overnight, Blood Cx. Transfuse 1 U PRBC 6/24 Afebrile. Remains off pressors. 100cc/hr removal /c CRRT. 30% FiO2.  6/25 L IJ, subclavian, axillary  + DVT. Heparin initiated. Remains on CRRT.  6/26  CT ABD/pelvis positive for free air and ascites. Back to OR  for ex lap of perforated viscous, resection of previous anastomosis, application of wound vac.  1U RBCs intra op.   6/28 Back on pressors with hgb <7 / sedated on fent drip, PRBC 6/29 Pending OR for possible closure  7/01 Less sedation, following commands, abd open with VAC, on CVVHD (even) 7/03 OR, unable to close but bowel "looks some better", hematoma in abd, on CVVHD  Consults:  PCCM 6/7 Renal 6/09 AKI, acidosis Cardiology 6/09 Takotsubo CM   Procedures:  ETT 6/07 >> 6/19, 6/20 >> Rt IJ HD 6/09 >> Rt [place] CVL 6/09 >> Lt radial aline 6/09 >> discontinued  Significant Diagnostic Tests:  CT abd/pelvis 6/07 >> focal perforated loop of SB with free air with multiple complex ventral hernias Echo 6/08 >> EF 20%, Takotsubo CM CT head 6/20 >> negative for any acute findings CT abdomen 6/20 >> bilateral effusions, atelectasis left lower lobe, results noted.    CT Chest 6/20 >> small bilateral pleural effusions with compressive atelectasis, anterior R 4-7 rib fractures CT ABD/Pelvis 6/20 >> small bowel surgical changes, mild circumferential wall thickening of scattered small bowel loops, small to moderate ascites LUE Duplex 6/24 >> DVT L IJ, subclavian, axillary, SVT L cephalic  CT ABD/Pelvis 5/03 >> large volume ascites with intraperitoneal free air   Micro Data:  COVID 6/07 >> negative Tracheal aspirate 6/20 >> E Coli sensitive to zosyn BCx2 6/23 >> negative BCx2  6/26 >> negative   Antimicrobials:  Zosyn 6/07 >> 6/17 Diflucan 6/07 >> 6/10 Cefepime 6/21 >>6/26 Vanco 6/21 >> 6/25 Anidulafungin 6/26 >> Zosyn 6/26 >>  Interim history/subjective:  Afebrile.  Returned from [place], sedate.  Family at bedside.  Objective   Blood pressure (!) 106/52, pulse 85, temperature (!) 97.5 F (36.4 C), resp. rate (!) 22, height 5\' 4"  (1.626 m), weight 67.4 kg, SpO2 100 %.    Vent Mode: PRVC FiO2 (%):  [30 %] 30 % Set Rate:  [18 bmp] 18 bmp Vt Set:  [430 mL] 430 mL PEEP:  [5 cmH20] 5 cmH20 Plateau Pressure:  [18 cmH20-22 cmH20] 19 cmH20   Intake/Output Summary (Last 24 hours) at  12/30/2018 5361 Last data filed at 12/23/2018 4431 Gross per 24 hour  Intake 3268.94 ml  Output 3005 ml  Net 263.94 ml   Filed Weights   12/15/18 0600 12/16/18 0500 01/13/2019 0500  Weight: 67.1 kg 67.1 kg 67.4 kg    Examination: General: adult female lying in bed, critically ill appearing HEENT: MM pink/moist, ETT Neuro: sedate  CV: s1s2 rrr, no m/r/g PULM: even/non-labored, lungs bilaterally clear  VQ:MGQQ, non-tender, bsx4 active  Extremities: warm/dry, LUE edema, no edema in other extremities   Skin: no rashes or lesions  Resolved Hospital Problem list   Metabolic alkalosis Abdominal peritonitis with septic shock Circulatory shock, mix of cardiogenic and sepsis off pressors as of 6/15 Cardiorespiratory arrest 6/13 Shock liver Acute metabolic encephalopathy Hypophosphatemia  Assessment & Plan:   Small Bowel Perforation s/p Resection, Lysis of Adhesions -reexploration 6/14, creation of functional end to end small bowel anastomosis and closure of abdominal wound -reexploration 6/26 with pervious anastomosis broken down with holes in same s/p resection of anastomosis with placement of wound vac -6/29 OR findings of pus in the abd, bilious inflammatory rind on intestines & additional perforation -7/03 OR with hematoma, improvement in bowel appearance, not closed  P: Post operative care per [facility] She has had multiple surgeries, unclear if she can recover from this illness despite the heroic efforts of [facility].  Will defer to [facility] regarding surgical prognosis.   Appreciate Palliative Care input  Plan is for her to return to OR 7/6, if patient / family want to push forward, would recommend a tracheostomy  TPN per pharmacy   Acute Metabolic Encephalopathy -Previous head CT negative, similar unexplained events during this admission  -Suspect multifactorial in setting of critical illness, infection and protracted course, improved 7/1 P: Fentanyl for pain control / sedation  Supportive  care / PT efforts as able / passive ROM  Fever, severe sepsis -suspect 2/2 DVT and peritonitis and less likely E-Coli VAP -Has been Tx for E coli in sputum -Blood Cx remain NGTD however pt had anastomosis broken down with holes in same s/p re-ex lap with resection P: ABX as above, ? Duration of therapy given complex abdominal process  Anemia -suspect anemia of critical illness, acute blood loss and nutritional   P:  Transfused PRBC x1 7/3 pre-op  Follow Hgb trend / CBC  Acute Hypoxic Respiratory Failure  -3rd intubation. Unfortunately she has not been in a position to attempt SBT and vent liberation. She is unlikely able to be weaned from vent at this point and would require trach/long term mechanical ventilation and continued weaning efforts P: PRVC 8cc/kg  Wean PEEP / FiO2 for sats >90% Ok for PSV as tolerated but not candidate for extubation  Will need trach if family wants to push forward > consider placement on 7/6 at return to OR. Concerned that she will not have good functional recovery.   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%, Takotsubo Cardiomyopathy P: CVVHD per Nephrology  PRN hydralazine  Home lisinopril, pravachol on hold  Consider repeat ECHO if stabilized / improved   Acute kidney injury from tubular necrosis -baseline creatinine of 0.73 from 11/17/2018 Hypophosphatemia  P: Doubt she will have renal recovery  Appreciate Nephrology  Trend BMP / urinary output Replace electrolytes as indicated Avoid nephrotoxic agents, ensure adequate renal perfusion  Type 2 Diabetes P: SSI  Thrombocytopenia, improved -secondary to sepsis, medications and consumptive  P: Follow CBC  On heparin gtt > ok to restart per [facility] this evening   Moderate to Severe Protein Calorie Malnutrition  P: TPN   Acute DVT- Left IJ, subclavian, axillary  P: Heparin gtt per  pharmacy   Difficult IV Access -L chest port accessed  -DVT of L IJ -moisture in groin  P: Hold changing of lines at this point.  She has no other site that is safe for placement of additional IV's at this time.  Sites are clean, blood cultures negative as of 6/26.  Will follow closely.   Hypothyroidism P: IV Synthroid   Best practice:  Diet: TPN. DVT prophylaxis: SCDs,  IV hep GI prophylaxis: Protonix Mobility: Bedrest Code Status: Full code Disposition: ICU.  Palliative assisting with goals of care - pt is in MS[facility] and will, at a minimum, require LTACH post hospitalization, possibly including permanent dialysis & tracheostomy. Concerned she will have poor functional recovery. Discussions per Palliative / [facility]. Spoke with family 7/3, as long as she is a candidate for closure / further surgical intervention they want to continue aggressive care.    Labs:   CMP Latest Ref Rng & Units 01/08/2019 12/16/2018 12/16/2018  Glucose 70 - 99 mg/dL 118(H) 122(H) 114(H)  BUN 8 - 23 mg/dL 48(H) 48(H) 45(H)  Creatinine 0.44 - 1.00 mg/dL 1.00 1.02(H) 0.99  Sodium 135 - 145 mmol/L 137 138 137  Potassium 3.5 - 5.1 mmol/L 4.2 4.3 4.1  Chloride 98 - 111 mmol/L 104 105 104  CO2 22 - 32 mmol/L 27 26 26   Calcium 8.9 - 10.3 mg/dL 7.5(L) 7.6(L) 7.6(L)  Total Protein 6.5 - 8.1 g/dL - - 6.0(L)  Total Bilirubin 0.3 - 1.2 mg/dL - - 0.4  Alkaline Phos 38 - 126 U/L - - 225(H)  AST 15 - 41 U/L - - 24  ALT 0 - 44 U/L - - 22   CBC Latest Ref Rng & Units 01/12/2019 12/16/2018 12/16/2018  WBC 4.0 - 10.5 K/uL 21.0(H) - 22.4(H)  Hemoglobin 12.0 - 15.0 g/dL 6.7(LL) 7.2(L) 6.7(LL)  Hematocrit 36.0 - 46.0 % 20.7(L) 22.8(L) 21.6(L)  Platelets 150 - 400 K/uL 160 - 135(L)    CBG (last 3)  Recent Labs    12/16/18 1826 12/16/18 2348 01/02/2019 0619  GLUCAP 110* 71 145*   CC Time: 35 minutes   [name], NP-C  Pulmonary & Critical Care Pgr: [phone] or if no answer [phone] 01/07/2019, 9:40 AM

## 2018-12-17 NOTE — Transfer of Care (Signed)
Immediate Anesthesia Transfer of Care Note  Patient: Carmen Stone  Procedure(s) Performed: [facility] OUT OF ABDOMEN (N/A Abdomen) APPLICATION OF WOUND VAC (N/A Abdomen)  Patient Location: ICU  Anesthesia Type:General  Level of Consciousness: sedated and Patient remains intubated per anesthesia plan  Airway & Oxygen Therapy: Patient remains intubated per anesthesia plan  Post-op Assessment: Report given to RN and Post -op Vital signs reviewed and stable  Post vital signs: Reviewed and stable  Last Vitals:  Vitals Value Taken Time  BP 115/57 12/28/2018 0845  Temp 36.6 C 12/26/2018 0849  Pulse 91 01/10/2019 0849  Resp 17 12/21/2018 0849  SpO2 100 % 12/18/2018 0849  Vitals shown include unvalidated device data.  Last Pain:  Vitals:   12/16/18 1600  TempSrc: Bladder  PainSc:          Complications: No apparent anesthesia complications

## 2018-12-18 ENCOUNTER — Inpatient Hospital Stay (HOSPITAL_COMMUNITY): Payer: BLUE CROSS/BLUE SHIELD

## 2018-12-18 LAB — MAGNESIUM: Magnesium: 2.2 mg/dL (ref 1.7–2.4)

## 2018-12-18 LAB — GLUCOSE, CAPILLARY
Glucose-Capillary: 92 mg/dL (ref 70–99)
Glucose-Capillary: 103 mg/dL — ABNORMAL HIGH (ref 70–99)
Glucose-Capillary: 96 mg/dL (ref 70–99)
Glucose-Capillary: 96 mg/dL (ref 70–99)

## 2018-12-18 LAB — CBC
HCT: 22.4 % — ABNORMAL LOW (ref 36.0–46.0)
HCT: 21.3 % — ABNORMAL LOW (ref 36.0–46.0)
Hemoglobin: 7 g/dL — ABNORMAL LOW (ref 12.0–15.0)
Hemoglobin: 6.8 g/dL — CL (ref 12.0–15.0)
MCH: 30.2 pg (ref 26.0–34.0)
MCH: 31.1 pg (ref 26.0–34.0)
MCHC: 31.3 g/dL (ref 30.0–36.0)
MCHC: 31.9 g/dL (ref 30.0–36.0)
MCV: 96.6 fL (ref 80.0–100.0)
MCV: 97.3 fL (ref 80.0–100.0)
Platelets: 194 10*3/uL (ref 150–400)
Platelets: 243 10*3/uL (ref 150–400)
RBC: 2.32 MIL/uL — ABNORMAL LOW (ref 3.87–5.11)
RBC: 2.19 MIL/uL — ABNORMAL LOW (ref 3.87–5.11)
RDW: 15.2 % (ref 11.5–15.5)
RDW: 15.2 % (ref 11.5–15.5)
WBC: 23.4 10*3/uL — ABNORMAL HIGH (ref 4.0–10.5)
WBC: 24.6 10*3/uL — ABNORMAL HIGH (ref 4.0–10.5)
nRBC: 0.1 % (ref 0.0–0.2)
nRBC: 0.1 % (ref 0.0–0.2)

## 2018-12-18 LAB — RENAL FUNCTION PANEL
Albumin: 1.4 g/dL — ABNORMAL LOW (ref 3.5–5.0)
Albumin: 1.4 g/dL — ABNORMAL LOW (ref 3.5–5.0)
Anion gap: 7 (ref 5–15)
Anion gap: 5 (ref 5–15)
BUN: 45 mg/dL — ABNORMAL HIGH (ref 8–23)
BUN: 42 mg/dL — ABNORMAL HIGH (ref 8–23)
CO2: 26 mmol/L (ref 22–32)
CO2: 26 mmol/L (ref 22–32)
Calcium: 7.5 mg/dL — ABNORMAL LOW (ref 8.9–10.3)
Calcium: 7.5 mg/dL — ABNORMAL LOW (ref 8.9–10.3)
Chloride: 103 mmol/L (ref 98–111)
Chloride: 105 mmol/L (ref 98–111)
Creatinine, Ser: 1.01 mg/dL — ABNORMAL HIGH (ref 0.44–1.00)
Creatinine, Ser: 0.88 mg/dL (ref 0.44–1.00)
GFR calc Af Amer: >60 mL/min
GFR calc Af Amer: >60 mL/min (ref >60)
GFR calc non Af Amer: 59 mL/min — ABNORMAL LOW
GFR calc non Af Amer: >60 mL/min (ref >60)
Glucose, Bld: 97 mg/dL (ref 70–99)
Glucose, Bld: 116 mg/dL — ABNORMAL HIGH (ref 70–99)
Phosphorus: 2.4 mg/dL — ABNORMAL LOW (ref 2.5–4.6)
Phosphorus: 2.3 mg/dL — ABNORMAL LOW (ref 2.5–4.6)
Potassium: 4.3 mmol/L (ref 3.5–5.1)
Potassium: 4.2 mmol/L (ref 3.5–5.1)
Sodium: 136 mmol/L (ref 135–145)
Sodium: 136 mmol/L (ref 135–145)

## 2018-12-18 LAB — HEPARIN LEVEL (UNFRACTIONATED)
Heparin Unfractionated: 0.53 [IU]/mL (ref 0.30–0.70)
Heparin Unfractionated: 0.79 IU/mL — ABNORMAL HIGH (ref 0.30–0.70)
Heparin Unfractionated: 0.86 IU/mL — ABNORMAL HIGH (ref 0.30–0.70)

## 2018-12-18 LAB — FERRITIN: Ferritin: 599 ng/mL — ABNORMAL HIGH (ref 11–307)

## 2018-12-18 LAB — IRON AND TIBC
Iron: 41 ug/dL (ref 28–170)
Saturation Ratios: 21 % (ref 10.4–31.8)
TIBC: 198 ug/dL — ABNORMAL LOW (ref 250–450)
UIBC: 157 ug/dL

## 2018-12-18 LAB — PREPARE RBC (CROSSMATCH)

## 2018-12-18 MED ORDER — SODIUM CHLORIDE 0.9% IV SOLUTION
Freq: Once | INTRAVENOUS | Status: AC
  Administered 2018-12-18: 22:00:00 via INTRAVENOUS

## 2018-12-18 MED ORDER — HEPARIN (PORCINE) 25000 UT/250ML-% IV SOLN
1200.0000 [IU]/h | INTRAVENOUS | Status: DC
  Administered 2018-12-18: 1200 [IU]/h via INTRAVENOUS

## 2018-12-18 MED ORDER — HEPARIN (PORCINE) 25000 UT/250ML-% IV SOLN
1400.0000 [IU]/h | INTRAVENOUS | Status: DC
  Administered 2018-12-18: 1400 [IU]/h via INTRAVENOUS

## 2018-12-18 MED ORDER — STERILE WATER FOR INJECTION IV SOLN
INTRAVENOUS | Status: AC
  Administered 2018-12-18: 17:00:00 via INTRAVENOUS
  Filled 2018-12-18: qty 1320

## 2018-12-18 NOTE — Progress Notes (Signed)
Wound Vac drainage noted to be frank red at approx 1845. The canister had been changed at 1830. Approx 100cc of blood in the new canister. Dr. Redmond Pulling paged. Pharmacy also made aware since they are monitoring heparin gtt. Per Dr. Redmond Pulling, obtain stat CBC and call with results. RN asked about heparin gtt. Instructed to leave heparin gtt infusing until the results of the CBC are in. STAT cbc drawn/sent, report given to oncoming RN.

## 2018-12-18 NOTE — Progress Notes (Signed)
1 Day Post-Op   Subjective/Chief Complaint: No acute changes overnight Remains on vent   Objective: Vital signs in last 24 hours: Temp:  [95.5 F (35.3 C)-99.5 F (37.5 C)] 96.1 F (35.6 C) (07/04 0700) Pulse Rate:  [80-116] 84 (07/04 0700) Resp:  [14-24] 15 (07/04 0700) BP: (86-141)/(34-66) 134/55 (07/04 0700) SpO2:  [98 %-100 %] 100 % (07/04 0700) FiO2 (%):  [30 %] 30 % (07/04 0700) Last BM Date: 12/12/18  Intake/Output from previous day: 07/03 0701 - 07/04 0700 In: 3862.4 [I.V.:2815.2; Blood:300; IV Piggyback:675.2] Out: 3187 [Urine:83; Emesis/NG output:140; Drains:510; Blood:40] Intake/Output this shift: No intake/output data recorded.  Exam: Intubated and sedated Abdomen soft, VAC in place  Lab Results:  Recent Labs    12/16/2018 0419 12/18/18 0500  WBC 21.0* 23.4*  HGB 6.7* 7.0*  HCT 20.7* 22.4*  PLT 160 194   BMET Recent Labs    12/16/2018 1618 12/18/18 0500  NA 137 136  K 4.0 4.3  CL 105 103  CO2 22 26  GLUCOSE 133* 97  BUN 57* 45*  CREATININE 1.18* 1.01*  CALCIUM 6.8* 7.5*   PT/INR No results for input(s): LABPROT, INR in the last 72 hours. ABG No results for input(s): PHART, HCO3 in the last 72 hours.  Invalid input(s): PCO2, PO2  Studies/Results: Dg Chest Port 1 View  Result Date: 12/18/2018 CLINICAL DATA:  Followup ventilator support EXAM: PORTABLE CHEST 1 VIEW COMPARISON:  12/16/2018 FINDINGS: Endotracheal tube tip is 3 cm above the carina. Orogastric or nasogastric tube enters the stomach. Left subclavian power port tip in the SVC 1 cm above the right atrium. Right subclavian central line tip at the SVC RA junction. Right internal jugular central line tip in the SVC 4 cm above the right atrium. The vascularity is normal. The lungs are clear except for mild volume loss at the right base. IMPRESSION: Lines and tubes unchanged and well positioned. Lungs clear except for mild atelectasis at the right base. Electronically Signed   By: Nelson Chimes  M.D.   On: 12/18/2018 06:53   Dg Chest Port 1 View  Result Date: 12/27/2018 CLINICAL DATA:  Respiratory failure EXAM: PORTABLE CHEST 1 VIEW COMPARISON:  December 16, 2018 FINDINGS: The heart size and mediastinal contours are stable. Endotracheal tube is identified distal tip 3 cm from carina. Bilateral central venous line and nasogastric tube are identified unchanged. Mild patchy opacity is identified in the right lung base unchanged. The left lung is clear. The visualized skeletal structures are stable. IMPRESSION: Life supporting devices are stable compared to prior exam. Stable mild patchy opacity of right lung base unchanged. Electronically Signed   By: Abelardo Diesel M.D.   On: 12/28/2018 08:53    Anti-infectives: Anti-infectives (From admission, onward)   Start     Dose/Rate Route Frequency Ordered Stop   12/09/2018 1030  clindamycin (CLEOCIN) 900 mg, gentamicin (GARAMYCIN) 240 mg in sodium chloride 0.9 % 1,000 mL for intraperitoneal lavage      Irrigation To Surgery 12/06/2018 1026 12/03/2018 1042   12/11/18 1500  anidulafungin (ERAXIS) 100 mg in sodium chloride 0.9 % 100 mL IVPB     100 mg 78 mL/hr over 100 Minutes Intravenous Every 24 hours 11/24/2018 1007     12/11/18 0600  piperacillin-tazobactam (ZOSYN) IVPB 3.375 g     3.375 g 100 mL/hr over 30 Minutes Intravenous Every 6 hours 12/11/18 0317     12/12/2018 1800  piperacillin-tazobactam (ZOSYN) IVPB 2.25 g  Status:  Discontinued  2.25 g 100 mL/hr over 30 Minutes Intravenous Every 6 hours 12/01/2018 1413 12/11/18 0317   12/03/2018 1100  anidulafungin (ERAXIS) 200 mg in sodium chloride 0.9 % 200 mL IVPB     200 mg 78 mL/hr over 200 Minutes Intravenous  Once 12/07/2018 1007 11/19/2018 1456   11/24/2018 1100  piperacillin-tazobactam (ZOSYN) IVPB 3.375 g     3.375 g 100 mL/hr over 30 Minutes Intravenous  Once 11/26/2018 1027 11/20/2018 1245   12/08/18 1800  vancomycin (VANCOCIN) IVPB 750 mg/150 ml premix  Status:  Discontinued     750 mg 150 mL/hr over 60  Minutes Intravenous Every 48 hours 12/07/18 0822 12/07/18 0903   12/08/18 1000  ceFEPIme (MAXIPIME) 2 g in sodium chloride 0.9 % 100 mL IVPB  Status:  Discontinued     2 g 200 mL/hr over 30 Minutes Intravenous Every 24 hours 12/07/18 0822 12/07/18 0903   12/07/18 2200  ceFEPIme (MAXIPIME) 2 g in sodium chloride 0.9 % 100 mL IVPB  Status:  Discontinued     2 g 200 mL/hr over 30 Minutes Intravenous Every 12 hours 12/07/18 0903 11/16/2018 1027   12/07/18 1800  vancomycin (VANCOCIN) IVPB 750 mg/150 ml premix  Status:  Discontinued     750 mg 150 mL/hr over 60 Minutes Intravenous Every 24 hours 12/07/18 0903 12/09/18 1040   12/06/18 1800  vancomycin (VANCOCIN) IVPB 750 mg/150 ml premix  Status:  Discontinued     750 mg 150 mL/hr over 60 Minutes Intravenous Every 24 hours 12/05/18 1652 12/07/18 0822   12/05/18 1800  ceFEPIme (MAXIPIME) 2 g in sodium chloride 0.9 % 100 mL IVPB  Status:  Discontinued     2 g 200 mL/hr over 30 Minutes Intravenous Every 12 hours 12/05/18 1644 12/07/18 0822   12/05/18 1700  vancomycin (VANCOCIN) 1,250 mg in sodium chloride 0.9 % 250 mL IVPB     1,250 mg 166.7 mL/hr over 90 Minutes Intravenous  Once 12/05/18 1652 12/05/18 2019   12/05/2018 1800  fluconazole (DIFLUCAN) IVPB 200 mg  Status:  Discontinued     200 mg 100 mL/hr over 60 Minutes Intravenous Every 24 hours 11/16/2018 0748 12/09/2018 0847   12/02/2018 1800  fluconazole (DIFLUCAN) IVPB 400 mg  Status:  Discontinued     400 mg 100 mL/hr over 120 Minutes Intravenous Every 24 hours 12/08/2018 0847 11/24/2018 0856   11/30/2018 1400  piperacillin-tazobactam (ZOSYN) IVPB 2.25 g  Status:  Discontinued     2.25 g 100 mL/hr over 30 Minutes Intravenous Every 6 hours 11/16/2018 0748 12/01/18 1104   12/11/2018 0600  cefoTEtan (CEFOTAN) 2 g in sodium chloride 0.9 % 100 mL IVPB     2 g 200 mL/hr over 30 Minutes Intravenous On call to O.R. 11/22/18 1300 12/02/2018 0624   11/22/18 1800  fluconazole (DIFLUCAN) IVPB 400 mg  Status:  Discontinued      400 mg 100 mL/hr over 120 Minutes Intravenous Every 24 hours 11/20/2018 1528 12/05/2018 0748   11/22/18 1800  vancomycin (VANCOCIN) 1,500 mg in sodium chloride 0.9 % 500 mL IVPB  Status:  Discontinued     1,500 mg 250 mL/hr over 120 Minutes Intravenous Every 24 hours 11/22/18 0358 11/22/18 0750   11/22/18 0115  vancomycin (VANCOCIN) 1,500 mg in sodium chloride 0.9 % 500 mL IVPB     1,500 mg 250 mL/hr over 120 Minutes Intravenous  Once 11/22/18 0101 11/22/18 0319   12/11/2018 1630  fluconazole (DIFLUCAN) IVPB 800 mg     800 mg  100 mL/hr over 240 Minutes Intravenous  Once 12/06/2018 1527 11/20/2018 2013   12/03/2018 1600  piperacillin-tazobactam (ZOSYN) IVPB 3.375 g  Status:  Discontinued     3.375 g 12.5 mL/hr over 240 Minutes Intravenous Every 8 hours 11/17/2018 1522 11/29/2018 0748   12/09/2018 0545  piperacillin-tazobactam (ZOSYN) IVPB 3.375 g     3.375 g 100 mL/hr over 30 Minutes Intravenous  Once 11/22/2018 0530 12/12/2018 0626   12/14/2018 0530  piperacillin-tazobactam (ZOSYN) IVPB 4.5 g  Status:  Discontinued     4.5 g 200 mL/hr over 30 Minutes Intravenous  Once 12/14/2018 0517 11/19/2018 0529      Assessment/Plan: s/p Procedure(s): [place] OUT OF ABDOMEN (N/A) APPLICATION OF WOUND VAC (N/A)  Family wants to continue current care for now. Plan probable return to the OR Monday to see if bowel can be reanastomosed and fascia closed.  Continue TNA  Follow H/H   LOS: 27 days    Coralie Keens 12/18/2018

## 2018-12-18 NOTE — Progress Notes (Signed)
Daily Progress Note   Patient Name: Carmen Stone       Date: 12/18/2018 DOB: January 09, 1956  Age: 63 y.o. MRN#: [id number] Attending Physician: Nolon Nations, MD Primary Care Physician: Jettie Booze, NP Admit Date: 12/03/2018  Reason for Consultation/Follow-up: Establishing goals of care  Subjective: Saw and examined Ms. Vanscyoc today.   I called and spoke with her brother, Iona Beard.  We discussed plan for return to OR Monday and discussed that plan for continued aggressive intervention would include recommendation for tracheostomy.  He reports being agreeable to return for abdominal surgery on Monday, but he will need to discuss trach further with family, particularly patient's daughter.  I advised him that if goal remains for aggressive medical care, that they should be having this discussion as noted by PCCM to possibly plan for this at time of return to OR on Monday.     Length of Stay: 27  Current Medications: Scheduled Meds:  . chlorhexidine gluconate (MEDLINE KIT)  15 mL Mouth Rinse BID  . Chlorhexidine Gluconate Cloth  6 each Topical Daily  . dorzolamide-timolol  1 drop Both Eyes BID  . insulin aspart  3-9 Units Subcutaneous Q6H  . levothyroxine  12.5 mcg Intravenous Daily  . mouth rinse  15 mL Mouth Rinse 10 times per day  . pantoprazole (PROTONIX) IV  40 mg Intravenous Q24H    Continuous Infusions: .  prismasol BGK 4/2.5 400 mL/hr at 12/18/18 0707  .  prismasol BGK 4/2.5 200 mL/hr at 01/05/2019 2106  . sodium chloride 10 mL/hr at 12/18/18 1700  . anidulafungin Stopped (12/18/18 1650)  . fentaNYL infusion INTRAVENOUS 75 mcg/hr (12/18/18 1700)  . heparin 1,400 Units/hr (12/18/18 1700)  . norepinephrine (LEVOPHED) Adult infusion Stopped (12/15/18 0400)  . piperacillin-tazobactam  (ZOSYN)  IV Stopped (12/18/18 1143)  . prismasol BGK 4/2.5 1,500 mL/hr at 12/18/18 1644  . TPN ADULT (ION) 75 mL/hr at 12/18/18 1700  . TPN ADULT (ION) 75 mL/hr at 12/18/18 1709    PRN Meds: sodium chloride, albuterol, alteplase, fentaNYL (SUBLIMAZE) injection, heparin, hydrALAZINE, metoprolol tartrate, sodium chloride, sodium chloride flush  Physical Exam         General: Intubated.  Not awake today. Heart: Regular rate and rhythm. No murmur appreciated. Lungs: Good air movement  Abdomen: Vac in place.  Ext: No significant  edema Skin: Warm and dry  Vital Signs: BP (!) 118/44   Pulse 88   Temp (!) 96.8 F (36 C) (Core)   Resp 20   Ht '5\' 4"'  (1.626 m)   Wt 67.4 kg   SpO2 100%   BMI 25.51 kg/m  SpO2: SpO2: 100 % O2 Device: O2 Device: Ventilator O2 Flow Rate: O2 Flow Rate (L/min): 4 L/min  Intake/output summary:   Intake/Output Summary (Last 24 hours) at 12/18/2018 1752 Last data filed at 12/18/2018 1700 Gross per 24 hour  Intake 2949.67 ml  Output 3751 ml  Net -801.33 ml   LBM: Last BM Date: 12/12/18 Baseline Weight: Weight: 74.8 kg Most recent weight: Weight: (bed weight not working this morning)       Palliative Assessment/Data:    Flowsheet Rows     Most Recent Value  Intake Tab  Referral Department  Surgery  Unit at Time of Referral  ICU  Palliative Care Primary Diagnosis  Sepsis/Infectious Disease  Date Notified  11/22/2018  Palliative Care Type  New Palliative care  Reason for referral  Clarify Goals of Care  Date of Admission  12/11/2018  Date first seen by Palliative Care  12/14/18  # of days Palliative referral response time  1 Day(s)  # of days IP prior to Palliative referral  22  Clinical Assessment  Palliative Performance Scale Score  10%  Psychosocial & Spiritual Assessment  Palliative Care Outcomes  Palliative Care Outcomes  Clarified goals of care      Patient Active Problem List   Diagnosis Date Noted  . Acute respiratory failure with  hypoxia and hypercapnia ([place]) 12/12/2018  . Toxic metabolic encephalopathy 69/48/5462  . Thrombocytopenia, acquired ([place]) 12/12/2018  . Small bowel perforation ([place])   . Pressure injury of skin 11/29/2018  . Encounter for central line placement   . Septic shock ([place])   . AKI (acute kidney injury) ([place])   . S/P dialysis catheter insertion ([place])   . 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 Perforated viscus 12/14/2018  . Port-A-Cath in place 08/03/2017  . Port catheter in place 01/31/2016  . Hypersensitivity reaction 02/08/2014  . Drug induced neutropenia(288.03) 01/30/2014  . Nausea with vomiting 11/04/2013  . 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 ATN (acute tubular necrosis) 11/02/2013  . Acute respiratory failure ([place]) 11/02/2013  . Acute respiratory failure with hypoxia ([place]) 10/25/2013  . Abnormal urinalysis 10/25/2013  . Postoperative anemia due to acute blood loss 10/24/2013  . Breast abscess of female 10/22/2013  . HYPERLIPIDEMIA 07/16/2010  . ATELECTASIS 07/16/2010  . Type 2 diabetes mellitus ([place]) 06/24/2010  . 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 HYPOXEMIA 06/24/2010  . SMALL BOWEL OBSTRUCTION, HX OF 06/24/2010    Palliative Care Assessment & Plan   Patient Profile: 63 y.o. female  with past medical history of  admitted on 11/29/2018 with abdominal pain from perforation and complicated clinical course with multiple surgeries requiring resection of large portion of small bowel, code blue, and multisystem organ failure (EF 20%, on CRRT and vent dependent).     Assessment: Patient Active Problem List   Diagnosis Date Noted  . Acute respiratory failure with hypoxia and hypercapnia ([place]) 12/12/2018  . Toxic metabolic encephalopathy 70/35/0093  . Thrombocytopenia, acquired ([place]) 12/12/2018  . Small bowel perforation ([place])   . Pressure injury of skin 11/29/2018  . Encounter for central line placement   . Septic shock ([place])    . AKI (acute kidney injury) ([place])   .  S/P dialysis catheter insertion ([place])   . 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 Perforated viscus 12/09/2018  . Port-A-Cath in place 08/03/2017  . Port catheter in place 01/31/2016  . Hypersensitivity reaction 02/08/2014  . Drug induced neutropenia(288.03) 01/30/2014  . Nausea with vomiting 11/04/2013  . 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 ATN (acute tubular necrosis) 11/02/2013  . Acute respiratory failure ([place]) 11/02/2013  . Acute respiratory failure with hypoxia ([place]) 10/25/2013  . Abnormal urinalysis 10/25/2013  . Postoperative anemia due to acute blood loss 10/24/2013  . Breast abscess of female 10/22/2013  . HYPERLIPIDEMIA 07/16/2010  . ATELECTASIS 07/16/2010  . Type 2 diabetes mellitus ([place]) 06/24/2010  . 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 HYPOXEMIA 06/24/2010  . SMALL BOWEL OBSTRUCTION, HX OF 06/24/2010    Recommendations/Plan:  Discussed again with patient's brother/HCPOA.  He would like for her to return to OR Monday as planned.  He remains hopeful that she will improve and remains open to continuing all offered interventions. Continued discussion regarding tracheostomy and let him know that this would be next step if plan for continued aggressive care.  He will discuss with rest of family.   Code Status:    Code Status Orders  (From admission, onward)         Start     Ordered   11/30/2018 1515  Full code  Continuous     12/12/2018 1515        Code Status History    Date Active Date Inactive Code Status Order ID Comments User Context   12/03/2018 1512 12/14/2018 1515 Full Code [id number]  Chesley Mires, MD Inpatient   Advance Care Planning Activity       Prognosis:   Poor  Discharge Planning:  To Be Determined  Care plan was discussed with brother, bedside RN  Thank you for allowing the Palliative Medicine Team to assist in the care of this patient.   Time In:  1330 Time Out: 1410 Total Time 40 Prolonged Time Billed No      Greater than 50%  of this time was spent counseling and coordinating care related to the above assessment and plan.  Micheline Rough, MD  Please contact Palliative Medicine Team phone at [phone] for questions and concerns.

## 2018-12-18 NOTE — Progress Notes (Signed)
CRITICAL VALUE ALERT  Critical Value:  hgb 6.8  Date & Time Notied:  12/18/2018  2004  Provider Notified: yes  Orders Received/Actions taken: pending

## 2018-12-18 NOTE — Progress Notes (Addendum)
[facility] for IV heparin Indication: IJ DVT  No Known Allergies  Patient Measurements: Height: 5\' 4"  (162.6 cm) Weight: (bed weight not working this morning) IBW/kg (Calculated) : 54.7 Heparin Dosing Weight: 71 kg  Vital Signs: Temp: 96.8 F (36 C) (07/04 1800) Temp Source: Core (07/04 1700) BP: 136/49 (07/04 1800) Pulse Rate: 88 (07/04 1800)  Labs: Recent Labs    12/16/18 0600  12/16/18 1806 12/26/2018 0419 12/22/2018 1618 12/18/18 0058 12/18/18 0500 12/18/18 0819 12/18/18 1612 12/18/18 1805  HGB 6.7*  --  7.2* 6.7*  --   --  7.0*  --   --   --   HCT 21.6*  --  22.8* 20.7*  --   --  22.4*  --   --   --   PLT 135*  --   --  160  --   --  194  --   --   --   HEPARINUNFRC 0.71*  --  0.59  --   --  0.53  --  0.79*  --  0.86*  CREATININE 0.99   < >  --  1.00 1.18*  --  1.01*  --  0.88  --    < > = values in this interval not displayed.    Estimated Creatinine Clearance: 61 mL/min (by C-G formula based on SCr of 0.88 mg/dL).  Assessment: 26 yoF known to pharmacy from TPN and antibiotic dosing; complicated hospital course including cardiac arrest, intubation, VAP, and CRRT, now further complicated with extensive IJ clot (only L sided catheter removed 6/9). Pharmacy to dose heparin infusion.   Baseline INR, aPTT: baseline INR/aPTT mildly elevated but did have recent acute liver injury  Prior anticoagulation: none  Today, 12/18/2018: heparin level is above goal at 0.86 after heparin drip decreased from 1500 units/hr to 1400 units/hr Hg remains low at 7 - transfused 7/2 & 7/3. PLTC has recovered, now 194.  On CRRT.   Hematoma in abd removed in OR 7/3.   Goal of Therapy: Heparin level 0.3-0.7 units/ml Monitor platelets by anticoagulation protocol: Yes  Plan:  Decrease heparin to 1200 units/hr and check 8 hr HL  Daily CBC and heparin level  Monitor for signs of bleeding or thrombosis  Eudelia Bunch,  Pharm.D [phone] 12/18/2018 6:40 PM  Addendum:  RN called to say when she went to decrease heparin rate she noticed significant bleeding from wound VAC - she will let CCS know.  Eudelia Bunch, Pharm.D 12/18/2018 7:04 PM

## 2018-12-18 NOTE — Progress Notes (Signed)
NAME:  Carmen Stone, MRN:  [id number], DOB:  1955/09/28, LOS: 23 ADMISSION DATE:  11/18/2018, CONSULTATION DATE:  11/21/2018 REFERRING MD:  Dr. Ninfa Linden, Surgery, CHIEF COMPLAINT:  Abdominal pain   Brief History   63 y/o female presented to ER 12/02/2018 with nausea, vomiting, abdominal pain.  CT abd/pelvis showed focal perforated loop of SB with free air with multiple complex ventral hernias.  Underwent emergent laparotomy with SB resection, lysis of adhesions and wound vac.  Remained on vent and pressors post op.  Hospital course complicated by respiratory arrest 6/13, multiple reintubations, intermittent vasopressor needs, AKI on CVVHD.     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 Changing volume goal on CRRT to -100 cc an hour. Weaned 7 hours PSV   6/16 Encephalopathic. Hypertensive, adding labetalol.  IV TNA started 6/17 A little more awake.  Tolerating PSV but SBI exceeds 105.  Volume removal with CVVHD 6/18 Neuro status improved.  Required brief vasoactive drips.  Net negative fluid volume status.PSV 6/19 Extubated 6/20 reintubated for altered mentation, head CT negative  6/21 Tx PRBC x1.  Off vasopressors 6/23 Febrile overnight, Blood Cx. Transfuse 1 U PRBC 6/24 Afebrile. Remains off pressors. 100cc/hr removal /c CRRT. 30% FiO2.  6/25 L IJ, subclavian, axillary  + DVT. Heparin initiated. Remains on CRRT.  6/26  CT ABD/pelvis positive for free air and ascites. Back to OR  for ex lap of perforated viscous, resection of previous anastomosis, application of wound vac.  1U RBCs intra op.   6/28 Back on pressors with hgb <7 / sedated on fent drip, PRBC 6/29 Pending OR for possible closure  7/01 Less sedation, following commands, abd open with VAC, on CVVHD (even) 7/03 OR, unable to close but bowel "looks some better", hematoma in abd, on CVVHD  Consults:  PCCM 6/7 Renal 6/09 AKI, acidosis Cardiology 6/09 Takotsubo CM   Procedures:  ETT 6/07 >> 6/19, 6/20 >> Rt IJ HD 6/09 >> Rt [place] CVL 6/09 >> Lt radial aline 6/09 >> discontinued  Significant Diagnostic Tests:  CT abd/pelvis 6/07 >> focal perforated loop of SB with free air with multiple complex ventral hernias Echo 6/08 >> EF 20%, Takotsubo CM CT head 6/20 >> negative for any acute findings CT abdomen 6/20 >> bilateral effusions, atelectasis left lower lobe, results noted.    CT Chest 6/20 >> small bilateral pleural effusions with compressive atelectasis, anterior R 4-7 rib fractures CT ABD/Pelvis 6/20 >> small bowel surgical changes, mild circumferential wall thickening of scattered small bowel loops, small to moderate ascites LUE Duplex 6/24 >> DVT L IJ, subclavian, axillary, SVT L cephalic  CT ABD/Pelvis 6/76 >> large volume ascites with intraperitoneal free air   Micro Data:  COVID 6/07 >> negative Tracheal aspirate 6/20 >> E Coli sensitive to zosyn BCx2 6/23 >> negative BCx2  6/26 >> negative   Antimicrobials:  Zosyn 6/07 >> 6/17 Diflucan 6/07 >> 6/10 Cefepime 6/21 >>6/26 Vanco 6/21 >> 6/25 Anidulafungin 6/26 >> Zosyn 6/26 >>  Interim history/subjective:  Remains on full vent support, CRRT.  Objective   Blood pressure Marland Kitchen)  134/55, pulse 92, temperature (!) 96.1 F (35.6 C), temperature source Core, resp. rate 19, height 5\' 4"  (1.626 m), weight 67.4 kg, SpO2 100 %. CVP:  [3 mmHg] 3 mmHg  Vent Mode: PRVC FiO2 (%):  [30 %] 30 % Set Rate:  [18 bmp] 18 bmp Vt Set:  [430 mL] 430 mL PEEP:  [5 cmH20] 5 cmH20 Plateau Pressure:  [9 cmH20-20 cmH20] 19 cmH20   Intake/Output Summary (Last  24 hours) at 12/18/2018 0753 Last data filed at 12/18/2018 0700 Gross per 24 hour  Intake 3862.39 ml  Output 3187 ml  Net 675.39 ml   Filed Weights   12/15/18 0600 12/16/18 0500 12/16/2018 0500  Weight: 67.1 kg 67.1 kg 67.4 kg    Examination:  General - sedated Eyes - pupils reactive ENT - ETT in place Cardiac - regular rate/rhythm, no murmur Chest - equal breath sounds b/l, no wheezing or rales Abdomen - wound vac in place Extremities - 1+ edema Skin - no rashes Neuro - RASS -3  CXR (reviewed by me) - ETT in good position   Resolved Hospital Problem list   Metabolic alkalosis, Abdominal peritonitis with septic shock, Circulatory shock, Cardiorespiratory arrest 6/13, Shock liver, Hypophosphatemia, thrombocytopenia  Assessment & Plan:  Acute Hypoxic Respiratory Failure  -3rd intubation. Unfortunately she has not been in a position to attempt SBT and vent liberation. She is unlikely able to be weaned from vent at this point and would require trach/long term mechanical ventilation and continued weaning efforts P: - full vent support - f/u CXR intermittently - d/w surgery on 7/04 about possible need for tracheostomy if family wishes to continue aggressive care, and whether it would be best to arrange for tracheostomy when she has next OR trip  Small Bowel Perforation s/p Resection, Lysis of Adhesions -reexploration 6/14, creation of functional end to end small bowel anastomosis and closure of abdominal wound -reexploration 6/26 with pervious anastomosis broken down with holes in same s/p resection of anastomosis with placement of wound vac -6/29 OR findings of pus in the abd, bilious inflammatory rind on intestines & additional perforation -7/03 OR with hematoma, improvement in bowel appearance, not closed  P: - post op care, nutrition per CCS - tentative plan to return to OR 3/33  Acute Metabolic Encephalopathy -Previous head CT negative, similar unexplained events during this  admission  -Suspect multifactorial in setting of critical illness, infection and protracted course, improved 7/1 P: - RASS goal -1 to -2 until abdominal surgical issues more stable  Fever, severe sepsis -suspect 2/2 DVT and peritonitis and less likely E-Coli VAP -Has been Tx for E coli in sputum -Blood Cx remain NGTD however pt had anastomosis broken down with holes in same s/p re-ex lap with resection P: - day 8/14 of zosyn, anidulafungin   Anemia -suspect anemia of critical illness, acute blood loss and nutritional   P:  - f/u CBC - transfuse for Hb < 7 or bleeding - check iron levels  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 P: - monitor hemodynamics - hold outpt lisinopril, pravachol - f/u Echo when more stable - prn hydralazine for SBP > 160  Acute kidney injury from tubular necrosis. -baseline creatinine of 0.73 from 12/07/2018 P: - continue CRRT  Type 2 Diabetes P: - SSI  Moderate to Severe Protein Calorie Malnutrition  P: - continue TPN  Acute DVT- Left IJ, subclavian, axillary  P: - continue heparin gtt  Difficult IV Access -  L chest port accessed  -DVT of L IJ -moisture in groin  P: - keep current lines in place  Hypothyroidism P: - IV synthroid  Best practice:  Diet: TPN. DVT prophylaxis: SCDs,  IV hep GI prophylaxis: Protonix Mobility: Bedrest Code Status: Full code Disposition: ICU.  Palliative assisting with goals of care - pt is in MSOF and will, at a minimum, require LTACH post hospitalization, possibly including permanent dialysis & tracheostomy. Concerned she will have poor functional recovery. Discussions per Palliative / CCS. Spoke with family 7/3, as long as she is a candidate for closure / further surgical intervention they want to continue aggressive care.    Labs:   CMP Latest Ref Rng & Units 12/18/2018 01/10/2019 01/03/2019  Glucose 70 - 99 mg/dL 97 133(H) 118(H)  BUN 8 - 23 mg/dL  45(H) 57(H) 48(H)  Creatinine 0.44 - 1.00 mg/dL 1.01(H) 1.18(H) 1.00  Sodium 135 - 145 mmol/L 136 137 137  Potassium 3.5 - 5.1 mmol/L 4.3 4.0 4.2  Chloride 98 - 111 mmol/L 103 105 104  CO2 22 - 32 mmol/L 26 22 27   Calcium 8.9 - 10.3 mg/dL 7.5(L) 6.8(L) 7.5(L)  Total Protein 6.5 - 8.1 g/dL - - -  Total Bilirubin 0.3 - 1.2 mg/dL - - -  Alkaline Phos 38 - 126 U/L - - -  AST 15 - 41 U/L - - -  ALT 0 - 44 U/L - - -   CBC Latest Ref Rng & Units 12/18/2018 01/05/2019 12/16/2018  WBC 4.0 - 10.5 K/uL 23.4(H) 21.0(H) -  Hemoglobin 12.0 - 15.0 g/dL 7.0(L) 6.7(LL) 7.2(L)  Hematocrit 36.0 - 46.0 % 22.4(L) 20.7(L) 22.8(L)  Platelets 150 - 400 K/uL 194 160 -    CBG (last 3)  Recent Labs    12/28/2018 1818 12/18/18 0053 12/18/18 [place], MD [place] 12/18/2018, 8:03 AM

## 2018-12-18 NOTE — Progress Notes (Signed)
[facility] ROUNDING NOTE   Subjective:   This is a 63 year old lady with acute kidney injury last baseline 0.79 06/04/2018.  She had acute oliguric acute tubular necrosis in the setting of septic shock from bowel perforation.  Was on CRRT from 6/ 9  to  6/12 was held after cardiac arrest.  She resumed 11/20/2018 after abdominal closure.  Still no signs of renal recovery.  Status post small bowel perforation due to closed-loop SBO and septic shock followed by CCM underwent closure 11/24/2018 new recent bowel leak due to nonhealing anastomosis.  Went back to the OR 11/26/2018 for resection of anastomosis.  She went back to the OR 11/20/2018.  She has a large wound VAC.  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  No history of diabetes mellitus type 2.  Urine output 97 cc 01/13/2019.  Has been kept even on CRRT  Blood pressure 131/71 pulse 80 temperature 96 O2 sats 100% FiO2 30  Sodium 136 potassium 4.3 chloride 103 CO2 26 BUN 45 creatinine 1 glucose 97 calcium 7.5 phosphorus 2.4 magnesium 2.2 albumin 1.4 WBC 23.4 hemoglobin 7.0 platelets 194  IV heparin IV TPN  IV Zosyn 3.375 g IV eraxis   CT scan abdomen 12/09/2018 shows large amount of intraperitoneal free fluid which is progressed to large volumes ascites.  Prominent intraperitoneal free air collection under the anterior abdominal wall with gas scattered in the central small bowel mesentery.  Patchy airspace disease.  Levothyroxine 12.5 mcg IV daily Protonix 40 mg IV every 24 hours  Chest x-ray revealed clear lungs except for mild atelectasis right base   Objective:  Vital signs in last 24 hours:  Temp:  [95.5 F (35.3 C)-99.5 F (37.5 C)] 96.3 F (35.7 C) (07/04 0800) Pulse Rate:  [80-116] 80 (07/04 0800) Resp:  [14-24] 16 (07/04 0800) BP: (86-151)/(34-66) 151/61 (07/04 0800) SpO2:  [98 %-100 %] 100 % (07/04 0800) FiO2 (%):  [30 %] 30 % (07/04 0740)  Weight change:  Filed Weights    12/15/18 0600 12/16/18 0500 01/13/2019 0500  Weight: 67.1 kg 67.1 kg 67.4 kg    Intake/Output: I/O last 3 completed shifts: In: 4959.8 [I.V.:3840.4; Blood:300; Other:72; IV Piggyback:747.4] Out: 3734 [Urine:123; Emesis/NG output:290; Drains:1010; KAJGO:1157; Blood:40]   Intake/Output this shift:  Total I/O In: 105 [I.V.:105] Out: 188 [Urine:10; Emesis/NG output:10; Drains:50; Other:118] Intubated and sedated making grimacing faces. CVS- RRR no JVP RS- CTA intubated with rhonchorous breath sounds ABD- BS present soft non-distended EXT-1-2+ lower extremity edema   Basic Metabolic Panel: Recent Labs  Lab 12/14/18 0551  12/15/18 0522  12/16/18 0600 12/16/18 1805 12/16/2018 0419 01/10/2019 1618 12/18/18 0500  NA 138   < > 137   < > 137 138 137 137 136  K 4.1   < > 4.1   < > 4.1 4.3 4.2 4.0 4.3  CL 104   < > 103   < > 104 105 104 105 103  CO2 27   < > 25   < > _0 GLUCOSE 113*   < > 113*   < > 114* 122* 118* 133* 97  BUN 47*   < > 44*   < > 45* 48* 48* 57* 45*  CREATININE 1.15*   < > 1.06*   < > 0.99 1.02* 1.00 1.18* 1.01*  CALCIUM 7.5*   < > 7.5*   < > 7.6* 7.6* 7.5* 6.8* 7.5*  MG 2.1  --  2.2  --  2.2  --  2.2  --  2.2  PHOS 2.5  2.5   < > 1.9*   < > 2.2* 2.6 2.1* 3.7 2.4*   < > = values in this interval not displayed.    Liver Function Tests: Recent Labs  Lab 11/24/2018 0500  12/16/18 0600 12/16/18 1805 01/05/2019 0419 12/25/2018 1618 12/18/18 0500  AST 30  --  24  --   --   --   --   ALT 27  --  22  --   --   --   --   ALKPHOS 217*  --  225*  --   --   --   --   BILITOT 1.0  --  0.4  --   --   --   --   PROT 5.7*  --  6.0*  --   --   --   --   ALBUMIN 1.5*  1.4*   < > 1.4* 1.3* 1.3* 1.4* 1.4*   < > = values in this interval not displayed.   No results for input(s): LIPASE, AMYLASE in the last 168 hours. No results for input(s): AMMONIA in the last 168 hours.  CBC: Recent Labs  Lab 12/14/2018 0500 12/14/18 0551 12/15/18 0522 12/16/18 0600  12/16/18 1806 01/07/2019 0419 12/18/18 0500  WBC 22.8* 23.2* 19.9* 22.4*  --  21.0* 23.4*  NEUTROABS 17.3*  --   --   --   --   --   --   HGB 9.2* 8.9* 7.5* 6.7* 7.2* 6.7* 7.0*  HCT 28.5* 28.4* 24.1* 21.6* 22.8* 20.7* 22.4*  MCV 92.8 97.6 96.8 96.4  --  94.1 96.6  PLT 120* 102* 108* 135*  --  160 194    Cardiac Enzymes: No results for input(s): CKTOTAL, CKMB, CKMBINDEX, TROPONINI in the last 168 hours.  BNP: Invalid input(s): POCBNP  CBG: Recent Labs  Lab 12/31/2018 0619 01/04/2019 1233 12/25/2018 1818 12/18/18 0053 12/18/18 0553  GLUCAP 145* 94 126* 92 103*    Microbiology: Results for orders placed or performed during the hospital encounter of 12/01/2018  SARS Coronavirus 2 (CEPHEID - Performed in [facility] hospital lab), Hosp Order     Status: None   Collection Time: 11/30/2018  3:55 AM   Specimen: Nasopharyngeal Swab  Result Value Ref Range Status   SARS Coronavirus 2 NEGATIVE NEGATIVE Final    Comment: (NOTE) If result is NEGATIVE SARS-CoV-2 target nucleic acids are NOT DETECTED. The SARS-CoV-2 RNA is generally detectable in upper and lower  respiratory specimens during the acute phase of infection. The lowest  concentration of SARS-CoV-2 viral copies this assay can detect is 250  copies / mL. A negative result does not preclude SARS-CoV-2 infection  and should not be used as the sole basis for treatment or other  patient management decisions.  A negative result may occur with  improper specimen collection / handling, submission of specimen other  than nasopharyngeal swab, presence of viral mutation(s) within the  areas targeted by this assay, and inadequate number of viral copies  (<250 copies / mL). A negative result must be combined with clinical  observations, patient history, and epidemiological information. If result is POSITIVE SARS-CoV-2 target nucleic acids are DETECTED. The SARS-CoV-2 RNA is generally detectable in upper and lower  respiratory specimens  dur ing the acute phase of infection.  Positive  results are indicative of active infection with SARS-CoV-2.  Clinical  correlation with patient history and other diagnostic information is  necessary to determine patient infection status.  Positive results do  not rule out bacterial infection or co-infection with other viruses. If result is PRESUMPTIVE POSTIVE SARS-CoV-2 nucleic acids MAY BE PRESENT.   A presumptive positive result was obtained on the submitted specimen  and confirmed on repeat testing.  While 2019 novel coronavirus  (SARS-CoV-2) nucleic acids may be present in the submitted sample  additional confirmatory testing may be necessary for epidemiological  and / or clinical management purposes  to differentiate between  SARS-CoV-2 and other Sarbecovirus currently known to infect humans.  If clinically indicated additional testing with an alternate test  methodology [phone]) is advised. The SARS-CoV-2 RNA is generally  detectable in upper and lower respiratory sp ecimens during the acute  phase of infection. The expected result is Negative. Fact Sheet for Patients:  StrictlyIdeas.no Fact Sheet for Healthcare Providers: BankingDealers.co.za This test is not yet approved or cleared by the [place] FDA and has been authorized for detection and/or diagnosis of SARS-CoV-2 by FDA under an Emergency Use Authorization (EUA).  This EUA will remain in effect (meaning this test can be used) for the duration of the COVID-19 declaration under Section 564(b)(1) of the Act, 21 U.S.C. section 360bbb-3(b)(1), unless the authorization is terminated or revoked sooner. Performed at [facility], [place] [address] [place], [place] [address]   MRSA PCR Screening     Status: None   Collection Time: 11/22/18 10:14 AM   Specimen: Nasal Mucosa; Nasopharyngeal  Result Value Ref Range Status   MRSA by PCR NEGATIVE NEGATIVE Final     Comment:        The GeneXpert MRSA Assay (FDA approved for NASAL specimens only), is one component of a comprehensive MRSA colonization surveillance program. It is not intended to diagnose MRSA infection nor to guide or monitor treatment for MRSA infections. Performed at [facility], [place] [address] [place], [place] [address]   Culture, respiratory (non-expectorated)     Status: None   Collection Time: 12/05/18 11:25 AM   Specimen: Tracheal Aspirate; Respiratory  Result Value Ref Range Status   Specimen Description   Final    TRACHEAL ASPIRATE Performed at [facility] [address], [place], [place] [address]    Special Requests   Final    NONE Performed at [facility], [place] [address] [place], [place] [address]    Gram Stain   Final    RARE WBC PRESENT, PREDOMINANTLY PMN NO ORGANISMS SEEN Performed at [facility], [place] [address] [place], [place] [address]    Culture RARE ESCHERICHIA COLI  Final   Report Status 12/07/2018 FINAL  Final   Organism ID, Bacteria ESCHERICHIA COLI  Final      Susceptibility   Escherichia coli - MIC*    AMPICILLIN >=32 RESISTANT Resistant     CEFAZOLIN <=4 SENSITIVE Sensitive     CEFEPIME <=1 SENSITIVE Sensitive     CEFTAZIDIME <=1 SENSITIVE Sensitive     CEFTRIAXONE <=1 SENSITIVE Sensitive     CIPROFLOXACIN <=0.25 SENSITIVE Sensitive     GENTAMICIN <=1 SENSITIVE Sensitive     IMIPENEM <=0.25 SENSITIVE Sensitive     TRIMETH/SULFA <=20 SENSITIVE Sensitive     AMPICILLIN/SULBACTAM >=32 RESISTANT Resistant     PIP/TAZO <=4 SENSITIVE Sensitive     Extended ESBL NEGATIVE Sensitive     * RARE ESCHERICHIA COLI  Culture, blood (routine x 2)     Status: None   Collection Time:  12/07/18 10:50 AM   Specimen: BLOOD LEFT ARM  Result Value Ref Range Status   Specimen Description   Final    BLOOD LEFT ARM Performed at [facility], [address], [place], [place]  [address]    Special Requests   Final    BOTTLES DRAWN AEROBIC AND ANAEROBIC Blood Culture adequate volume Performed at [facility] [address] [place], [place] [address]    Culture   Final    NO GROWTH 5 DAYS Performed at [facility], [place] [address], [place], [place] [address]    Report Status 12/12/2018 FINAL  Final  Culture, blood (routine x 2)     Status: None   Collection Time: 12/04/2018 12:24 PM   Specimen: BLOOD  Result Value Ref Range Status   Specimen Description   Final    BLOOD RIGHT ARM Performed at [facility] [address] [place], [place] [address]    Special Requests   Final    BOTTLES DRAWN AEROBIC AND ANAEROBIC Blood Culture adequate volume Performed at [facility] [address], [place], [place] [address]    Culture   Final    NO GROWTH 5 DAYS Performed at [facility], [place] [address] [place], [place] [address]    Report Status 12/15/2018 FINAL  Final  Culture, blood (routine x 2)     Status: None   Collection Time: 11/24/2018  1:03 PM   Specimen: BLOOD  Result Value Ref Range Status   Specimen Description   Final    BLOOD RIGHT ANTECUBITAL Performed at [facility] [address], [place], [place] [address]    Special Requests   Final    BOTTLES DRAWN AEROBIC ONLY Blood Culture adequate volume Performed at [facility] [address], [place], [place] [address]    Culture   Final    NO GROWTH 5 DAYS Performed at [facility], [place] [address] [place], [place] [address]    Report Status 12/15/2018 FINAL  Final    Coagulation Studies: No results for input(s): LABPROT, INR in the last 72 hours.  Urinalysis: No results for input(s): COLORURINE, LABSPEC, PHURINE, GLUCOSEU, HGBUR, BILIRUBINUR, KETONESUR, PROTEINUR, UROBILINOGEN, NITRITE, LEUKOCYTESUR in the last 72 hours.  Invalid input(s): APPERANCEUR    Imaging: Dg Chest  Port 1 View  Result Date: 12/18/2018 CLINICAL DATA:  Followup ventilator support EXAM: PORTABLE CHEST 1 VIEW COMPARISON:  12/16/2018 FINDINGS: Endotracheal tube tip is 3 cm above the carina. Orogastric or nasogastric tube enters the stomach. Left subclavian power port tip in the SVC 1 cm above the right atrium. Right subclavian central line tip at the SVC RA junction. Right internal jugular central line tip in the SVC 4 cm above the right atrium. The vascularity is normal. The lungs are clear except for mild volume loss at the right base. IMPRESSION: Lines and tubes unchanged and well positioned. Lungs clear except for mild atelectasis at the right base. Electronically Signed   By: Nelson Chimes M.D.   On: 12/18/2018 06:53   Dg Chest Port 1 View  Result Date: 01/12/2019 CLINICAL DATA:  Respiratory failure EXAM: PORTABLE CHEST 1 VIEW COMPARISON:  December 16, 2018 FINDINGS: The heart size and mediastinal contours are stable. Endotracheal tube is identified distal tip 3 cm from carina. Bilateral central venous line and nasogastric tube are identified unchanged. Mild patchy opacity is identified in the right lung base unchanged.  The left lung is clear. The visualized skeletal structures are stable. IMPRESSION: Life supporting devices are stable compared to prior exam. Stable mild patchy opacity of right lung base unchanged. Electronically Signed   By: Abelardo Diesel M.D.   On: 01/03/2019 08:53     Medications:   .  prismasol BGK 4/2.5 400 mL/hr at 12/18/18 0707  .  prismasol BGK 4/2.5 200 mL/hr at 01/04/2019 2106  . sodium chloride 250 mL (12/28/2018 1011)  . anidulafungin Stopped (12/27/2018 1728)  . fentaNYL infusion INTRAVENOUS 50 mcg/hr (12/18/18 0200)  . heparin 1,500 Units/hr (12/18/18 0800)  . norepinephrine (LEVOPHED) Adult infusion Stopped (12/15/18 0400)  . piperacillin-tazobactam (ZOSYN)  IV 3.375 g (12/18/18 0615)  . prismasol BGK 4/2.5 1,500 mL/hr at 12/18/18 8938  . TPN ADULT (ION) 75 mL/hr at  01/14/2019 1854  . TPN ADULT (ION)     . chlorhexidine gluconate (MEDLINE KIT)  15 mL Mouth Rinse BID  . Chlorhexidine Gluconate Cloth  6 each Topical Daily  . dorzolamide-timolol  1 drop Both Eyes BID  . insulin aspart  3-9 Units Subcutaneous Q6H  . levothyroxine  12.5 mcg Intravenous Daily  . mouth rinse  15 mL Mouth Rinse 10 times per day  . pantoprazole (PROTONIX) IV  40 mg Intravenous Q24H   sodium chloride, albuterol, alteplase, fentaNYL (SUBLIMAZE) injection, heparin, hydrALAZINE, metoprolol tartrate, sodium chloride, sodium chloride flush  Assessment/ Plan:   Acute kidney injury with very little signs of recovery.  She is anuric she is requiring CRRT.  Baseline serum creatinine 0.9.  Acute kidney injury following perforated small bowel with fecal peritonitis and wound debridement.  Hypotension/volumes continue to keep even.  She may be able to tolerate some volume removal.    Electrolytes appear to be stable at this time  Acid-base balance stable at this time  Perforated small bowel with acute kidney injury 6/9 to 11/26/2018.  New recent bowel leak due to nonhealing of anastomosis went to the OR 12/09/2018 12/12/2018 appreciate assistance from Dr. Barry Dienes.  Wound VAC  Diabetes mellitus per primary team  Nutrition continues on TNA  Hypophosphatemia resolved added with TNA  Antimicrobials continues on Zosyn and Eraxis     LOS: [place] _0 _1 :44 AM

## 2018-12-18 NOTE — Progress Notes (Signed)
ANTICOAGULATION CONSULT NOTE - Follow Up Consult  Pharmacy Consult for Heparin Indication: IJ DVT  No Known Allergies  Patient Measurements: Height: 5\' 4"  (162.6 cm) Weight: 148 lb 9.4 oz (67.4 kg) IBW/kg (Calculated) : 54.7 Heparin Dosing Weight:   Vital Signs: Temp: 97 F (36.1 C) (07/04 0313) Temp Source: Core (07/03 1800) BP: 105/43 (07/04 0313) Pulse Rate: 98 (07/04 0313)  Labs: Recent Labs    12/15/18 0522  12/16/18 0600 12/16/18 1805 12/16/18 1806 01/14/2019 0419 12/25/2018 1618 12/18/18 0058  HGB 7.5*  --  6.7*  --  7.2* 6.7*  --   --   HCT 24.1*  --  21.6*  --  22.8* 20.7*  --   --   PLT 108*  --  135*  --   --  160  --   --   HEPARINUNFRC 0.47  --  0.71*  --  0.59  --   --  0.53  CREATININE 1.06*   < > 0.99 1.02*  --  1.00 1.18*  --    < > = values in this interval not displayed.    Estimated Creatinine Clearance: 45.5 mL/min (A) (by C-G formula based on SCr of 1.18 mg/dL (H)).   Medications:  Infusions:  .  prismasol BGK 4/2.5 400 mL/hr at 12/18/18 0306  .  prismasol BGK 4/2.5 200 mL/hr at 01/12/2019 2106  . sodium chloride 250 mL (12/24/2018 1011)  . anidulafungin Stopped (12/18/2018 1728)  . fentaNYL infusion INTRAVENOUS 50 mcg/hr (12/18/18 0200)  . heparin 1,500 Units/hr (12/18/18 0400)  . norepinephrine (LEVOPHED) Adult infusion Stopped (12/15/18 0400)  . piperacillin-tazobactam (ZOSYN)  IV Stopped (12/18/18 0041)  . prismasol BGK 4/2.5 1,500 mL/hr at 12/22/2018 2343  . TPN ADULT (ION) 75 mL/hr at 01/14/2019 1854    Assessment: Patient with heparin level at goal.  No heparin issues noted.  Goal of Therapy:  Heparin level 0.3-0.7 units/ml Monitor platelets by anticoagulation protocol: Yes   Plan:  Continue heparin drip at current rate Recheck level at 0900  Tyler Deis, Shea Stakes [place] 12/18/2018,4:29 AM

## 2018-12-18 NOTE — Progress Notes (Signed)
PHARMACY - ADULT TOTAL PARENTERAL NUTRITION CONSULT NOTE   Pharmacy Consult for TPN Indication: prolonged ileus  HPI: 6 yoF admitted 6/7 with perforated small bowel from closed-loop obstruction and underwent emergent exploratory laparotomy. Returned to OR on 6/9 and again on 6/14 for closure of abdominal wall.  Patient coded on 05-Dec-2022 and developed multiorgan failure. Remains intubated & on CRRT. Pressors have been weaned off and she is completing antibiotic course for sepsis/PNA.  NPO since admission on 6/7. Pharmacy consulted to start TPN 6/16. D/t acute liver injury from shock, initiated TPN at low rate & advance slowly as requested by surgery.  Patient Measurements: Height: _0  (162.6 cm) Weight: (bed weight not working this morning) IBW/kg (Calculated) : 54.7 TPN AdjBW (KG): 59.7 Body mass index is 25.51 kg/m. Usual Weight: 90kg   Intake/Output Summary (Last 24 hours) at 12/18/2018 0827 Last data filed at 12/18/2018 0800 Gross per 24 hour  Intake 3967.34 ml  Output 3335 ml  Net 632.34 ml    Recent Labs    12/16/18 0600  01/07/2019 0419 01/01/2019 1618 12/18/18 0500  NA 137   < > 137 137 136  K 4.1   < > 4.2 4.0 4.3  CL 104   < > 104 105 103  CO2 26   < > _1 GLUCOSE 114*   < > 118* 133* 97  BUN 45*   < > 48* 57* 45*  CREATININE 0.99   < > 1.00 1.18* 1.01*  CALCIUM 7.6*   < > 7.5* 6.8* 7.5*  PHOS 2.2*   < > 2.1* 3.7 2.4*  MG 2.2  --  2.2  --  2.2  ALBUMIN 1.4*   < > 1.3* 1.4* 1.4*  ALKPHOS 225*  --   --   --   --   AST 24  --   --   --   --   ALT 22  --   --   --   --   BILITOT 0.4  --   --   --   --    < > = values in this interval not displayed.    Significant events:  6/18: OK to increase to goal rate per surgery 6/22: CRRT stopped for holiday; CBG 69 overnight, D50 given but TPN continued (55 units insulin in TPN); BS and BM overnight - initiating trickle feeds 6/23: resuming CRRT, return to high-protein TPN; fevers overnight, CCM removing some central  lines; will use chemo port for TPN 6/24: began adding lipids to TPN formula 6/25: trickle feeds stopped d/t abd distention 6/26: CRRT clotted off overnight; patient to OR this AM for ex lap; RN attempting to restart CRRT post-op but still having issues at this time 6/27: CRRT appears to have been successfully resumed overnight 6/29 OR pus throughout abdomen. Small hole in small bowel 7/3 OR wash out of abd, replace wound VAC, abd remains open  Central access: 12/08/2018 TPN start date: 11/30/18  ASSESSMENT  Current Nutrition: NPO IVF: On CRRT to keep net neutral I/O  Today:   Glucose (goal 100-150) - Hx DM on 70/30 Novolog 20 units BID PTA.  CBGs acceptable  25 units insulin in TPN + ICU resistant scale; 3 units SSI required yesterday  Electrolytes - on CRRT; WNL x phos 2.4 after 10 mM Na phos bolus yesterday  Renal - AKI; SCr, bicarb normalized on CRRT, BUN remains elevated  LFTs - albumin remains low; alk phos 225  TGs - WNL (6/30)  Prealbumin - down to 5.8 6/30, reflects inflammation and critical illness,  NUTRITIONAL GOALS                                                                                             RD recs: (6/29):  Kcal:1514 kcal, Protein:195-215 grams Fluid:>/= 1.8 L/day  PLAN                                                                                                                         At 1800 today:  Continue TPN at 75 ml/hr  custom TPN at goal rate of 75 ml/hr provides: 198 g/day protein, 1512 Kcal/day = 100% support  Electrolytes: continue to hold Mag, small K, increased Na, increased phos, Cl:Ac = 1:2  TPN to contain standard multivitamins, trace elements MWF only due to national backorder.  IVF per MD  Continue ICU resistant-scale glycemic control orders q6h; continue 25 units regular insulin in TPN   TPN lab panels Mondays &  Thursdays  Ordered BID renal function panels while on CRRT  Eudelia Bunch, Pharm.D 12/18/2018 8:27 AM

## 2018-12-18 NOTE — Progress Notes (Signed)
[place] for IV heparin Indication: IJ DVT  No Known Allergies  Patient Measurements: Height: 5\' 4"  (162.6 cm) Weight: (bed weight not working this morning) IBW/kg (Calculated) : 54.7 Heparin Dosing Weight: 71 kg  Vital Signs: Temp: 96.3 F (35.7 C) (07/04 0800) Temp Source: Core (07/04 0700) BP: 151/61 (07/04 0800) Pulse Rate: 80 (07/04 0800)  Labs: Recent Labs    12/16/18 0600  12/16/18 1806 01/04/2019 0419 01/05/2019 1618 12/18/18 0058 12/18/18 0500 12/18/18 0819  HGB 6.7*  --  7.2* 6.7*  --   --  7.0*  --   HCT 21.6*  --  22.8* 20.7*  --   --  22.4*  --   PLT 135*  --   --  160  --   --  194  --   HEPARINUNFRC 0.71*  --  0.59  --   --  0.53  --  0.79*  CREATININE 0.99   < >  --  1.00 1.18*  --  1.01*  --    < > = values in this interval not displayed.    Estimated Creatinine Clearance: 53.1 mL/min (A) (by C-G formula based on SCr of 1.01 mg/dL (H)).  Assessment: 89 yoF known to pharmacy from TPN and antibiotic dosing; complicated hospital course including cardiac arrest, intubation, VAP, and CRRT, now further complicated with extensive IJ clot (only L sided catheter removed 6/9). Pharmacy to dose heparin infusion.   Baseline INR, aPTT: baseline INR/aPTT mildly elevated but did have recent acute liver injury  Prior anticoagulation: none  Today, 12/18/2018: Confirmatory heparin level is slightly high at 0.79 on heparin at 1500 units/hr Hg remains low at 7 - transfused 7/2 & 7/3. PLTC has recovered, now 194.  On CRRT.  No bleeding per RN. Hematoma in abd removed in OR 7/3.   Goal of Therapy: Heparin level 0.3-0.7 units/ml Monitor platelets by anticoagulation protocol: Yes  Plan:  Decrease heparin to 1400 units/hr and check 8 hr HL  Daily CBC and heparin level  Monitor for signs of bleeding or thrombosis  Eudelia Bunch, Pharm.D [phone] 12/18/2018 9:27 AM

## 2018-12-19 ENCOUNTER — Encounter (HOSPITAL_COMMUNITY): Admission: EM | Disposition: E | Payer: Self-pay | Source: Home / Self Care

## 2018-12-19 ENCOUNTER — Encounter (HOSPITAL_COMMUNITY): Payer: Self-pay | Admitting: Certified Registered Nurse Anesthetist

## 2018-12-19 ENCOUNTER — Inpatient Hospital Stay (HOSPITAL_COMMUNITY): Payer: BLUE CROSS/BLUE SHIELD

## 2018-12-19 LAB — RENAL FUNCTION PANEL
Albumin: 1.3 g/dL — ABNORMAL LOW (ref 3.5–5.0)
Albumin: 1.3 g/dL — ABNORMAL LOW (ref 3.5–5.0)
Anion gap: 6 (ref 5–15)
Anion gap: 8 (ref 5–15)
BUN: 45 mg/dL — ABNORMAL HIGH (ref 8–23)
BUN: 43 mg/dL — ABNORMAL HIGH (ref 8–23)
CO2: 24 mmol/L (ref 22–32)
CO2: 28 mmol/L (ref 22–32)
Calcium: 7.1 mg/dL — ABNORMAL LOW (ref 8.9–10.3)
Calcium: 8 mg/dL — ABNORMAL LOW (ref 8.9–10.3)
Chloride: 105 mmol/L (ref 98–111)
Chloride: 102 mmol/L (ref 98–111)
Creatinine, Ser: 0.94 mg/dL (ref 0.44–1.00)
Creatinine, Ser: 0.92 mg/dL (ref 0.44–1.00)
GFR calc Af Amer: >60 mL/min (ref >60)
GFR calc Af Amer: >60 mL/min (ref >60)
GFR calc non Af Amer: >60 mL/min (ref >60)
GFR calc non Af Amer: >60 mL/min (ref >60)
Glucose, Bld: 147 mg/dL — ABNORMAL HIGH (ref 70–99)
Glucose, Bld: 166 mg/dL — ABNORMAL HIGH (ref 70–99)
Phosphorus: 2.4 mg/dL — ABNORMAL LOW (ref 2.5–4.6)
Phosphorus: 2.5 mg/dL (ref 2.5–4.6)
Potassium: 4.3 mmol/L (ref 3.5–5.1)
Potassium: 4 mmol/L (ref 3.5–5.1)
Sodium: 135 mmol/L (ref 135–145)
Sodium: 138 mmol/L (ref 135–145)

## 2018-12-19 LAB — CBC
HCT: 18.4 % — ABNORMAL LOW (ref 36.0–46.0)
HCT: 28.5 % — ABNORMAL LOW (ref 36.0–46.0)
Hemoglobin: 6 g/dL — CL (ref 12.0–15.0)
Hemoglobin: 9.3 g/dL — ABNORMAL LOW (ref 12.0–15.0)
MCH: 31.1 pg (ref 26.0–34.0)
MCH: 29.9 pg (ref 26.0–34.0)
MCHC: 32.6 g/dL (ref 30.0–36.0)
MCHC: 32.6 g/dL (ref 30.0–36.0)
MCV: 95.3 fL (ref 80.0–100.0)
MCV: 91.6 fL (ref 80.0–100.0)
Platelets: 177 10*3/uL (ref 150–400)
Platelets: 170 10*3/uL (ref 150–400)
RBC: 1.93 MIL/uL — ABNORMAL LOW (ref 3.87–5.11)
RBC: 3.11 MIL/uL — ABNORMAL LOW (ref 3.87–5.11)
RDW: 15 % (ref 11.5–15.5)
RDW: 14.6 % (ref 11.5–15.5)
WBC: 33 10*3/uL — ABNORMAL HIGH (ref 4.0–10.5)
WBC: 19.1 10*3/uL — ABNORMAL HIGH (ref 4.0–10.5)
nRBC: 0.2 % (ref 0.0–0.2)
nRBC: 0.4 % — ABNORMAL HIGH (ref 0.0–0.2)

## 2018-12-19 LAB — GLUCOSE, CAPILLARY
Glucose-Capillary: 105 mg/dL — ABNORMAL HIGH (ref 70–99)
Glucose-Capillary: 123 mg/dL — ABNORMAL HIGH (ref 70–99)
Glucose-Capillary: 127 mg/dL — ABNORMAL HIGH (ref 70–99)

## 2018-12-19 LAB — PREPARE RBC (CROSSMATCH)

## 2018-12-19 LAB — PROTIME-INR
INR: 1.2 (ref 0.8–1.2)
Prothrombin Time: 14.9 seconds (ref 11.4–15.2)

## 2018-12-19 LAB — HEMOGLOBIN AND HEMATOCRIT, BLOOD
HCT: 29.3 % — ABNORMAL LOW (ref 36.0–46.0)
Hemoglobin: 9.6 g/dL — ABNORMAL LOW (ref 12.0–15.0)

## 2018-12-19 LAB — MAGNESIUM: Magnesium: 2.2 mg/dL (ref 1.7–2.4)

## 2018-12-19 SURGERY — LAPAROTOMY, EXPLORATORY
Anesthesia: General

## 2018-12-19 MED ORDER — SODIUM CHLORIDE 0.9 % IV BOLUS
500.0000 mL | Freq: Once | INTRAVENOUS | Status: AC
  Administered 2018-12-19: 500 mL via INTRAVENOUS

## 2018-12-19 MED ORDER — PRISMASOL BGK 4/2.5 32-4-2.5 MEQ/L REPLACEMENT SOLN
Status: DC
  Filled 2018-12-19 (×2): qty 5000

## 2018-12-19 MED ORDER — SODIUM CHLORIDE 0.9% IV SOLUTION: Freq: Once | INTRAVENOUS | Status: DC

## 2018-12-19 MED ORDER — DEXTROSE 5 % IV SOLN
Status: DC
  Administered 2018-12-19 – 2018-12-27 (×33): via INTRAVENOUS_CENTRAL
  Filled 2018-12-19 (×47): qty 1500

## 2018-12-19 MED ORDER — HEPARIN SODIUM (PORCINE) 1000 UNIT/ML DIALYSIS
1000.0000 [IU] | INTRAMUSCULAR | Status: DC | PRN
  Administered 2018-12-20: 09:00:00 2400 [IU] via INTRAVENOUS_CENTRAL
  Filled 2018-12-19 (×2): qty 6

## 2018-12-19 MED ORDER — PRISMASOL BGK 4/2.5 32-4-2.5 MEQ/L REPLACEMENT SOLN
Status: DC
  Administered 2018-12-19 – 2018-12-26 (×13): via INTRAVENOUS_CENTRAL
  Filled 2018-12-19 (×14): qty 5000

## 2018-12-19 MED ORDER — STERILE WATER FOR INJECTION IV SOLN
INTRAVENOUS | Status: AC
  Administered 2018-12-19: 18:00:00 via INTRAVENOUS
  Filled 2018-12-19: qty 1320

## 2018-12-19 MED ORDER — DEXTROSE 5 % IV SOLN
20.0000 g | INTRAVENOUS | Status: DC
  Administered 2018-12-19 – 2018-12-26 (×17): 20 g via INTRAVENOUS_CENTRAL
  Administered 2018-12-26: 03:00:00 via INTRAVENOUS_CENTRAL
  Administered 2018-12-27: 20 g via INTRAVENOUS_CENTRAL
  Filled 2018-12-19 (×29): qty 200

## 2018-12-19 MED ORDER — SODIUM CHLORIDE 0.9 % FOR CRRT
INTRAVENOUS_CENTRAL | Status: DC | PRN
  Filled 2018-12-19: qty 1000

## 2018-12-19 MED ORDER — PRISMASOL BGK 4/2.5 32-4-2.5 MEQ/L IV SOLN
INTRAVENOUS | Status: DC
  Administered 2018-12-19 – 2018-12-20 (×8): via INTRAVENOUS_CENTRAL
  Filled 2018-12-19 (×14): qty 5000

## 2018-12-19 NOTE — Progress Notes (Signed)
CRITICAL VALUE ALERT  Critical Value:  Hgb 6.0  Date & Time Notied:  01/04/2019 @ 0525am  Provider Notified: Warren Lacy on call & Greer Pickerel (on call surgery)  Orders Received/Actions taken: New orders recieved

## 2018-12-19 NOTE — Progress Notes (Signed)
PHARMACY - ADULT TOTAL PARENTERAL NUTRITION CONSULT NOTE   Pharmacy Consult for TPN Indication: prolonged ileus  HPI: 89 yoF admitted 6/7 with perforated small bowel from closed-loop obstruction and underwent emergent exploratory laparotomy. Returned to OR on 6/9 and again on 6/14 for closure of abdominal wall.  Patient coded on 06-Dec-2022 and developed multiorgan failure. Remains intubated & on CRRT. Pressors have been weaned off and she is completing antibiotic course for sepsis/PNA.  NPO since admission on 6/7. Pharmacy consulted to start TPN 6/16. D/t acute liver injury from shock, initiated TPN at low rate & advance slowly as requested by surgery.  Patient Measurements: Height: '5\' 4"'  (162.6 cm) Weight: (bed weight not working this morning) IBW/kg (Calculated) : 54.7 TPN AdjBW (KG): 59.7 Body mass index is 25.51 kg/m. Usual Weight: 90kg   Intake/Output Summary (Last 24 hours) at 01/05/2019 0729 Last data filed at 12/18/2018 0715 Gross per 24 hour  Intake 3789.12 ml  Output 3268 ml  Net 521.12 ml    Recent Labs    12/18/18 0500 12/18/18 1612 12/26/2018 0500  NA 136 136 135  K 4.3 4.2 4.3  CL 103 105 105  CO2 '26 26 24  ' GLUCOSE 97 116* 147*  BUN 45* 42* 45*  CREATININE 1.01* 0.88 0.94  CALCIUM 7.5* 7.5* 7.1*  PHOS 2.4* 2.3* 2.4*  MG 2.2  --  2.2  ALBUMIN 1.4* 1.4* 1.3*    Significant events:  6/18: OK to increase to goal rate per surgery 6/22: CRRT stopped for holiday; CBG 69 overnight, D50 given but TPN continued (55 units insulin in TPN); BS and BM overnight - initiating trickle feeds 6/23: resuming CRRT, return to high-protein TPN; fevers overnight, CCM removing some central lines; will use chemo port for TPN 6/24: began adding lipids to TPN formula 6/25: trickle feeds stopped d/t abd distention 6/26: CRRT clotted off overnight; patient to OR this AM for ex lap; RN attempting to restart CRRT post-op but still having issues at this time 6/27: CRRT appears to have been  successfully resumed overnight 6/29 OR pus throughout abdomen. Small hole in small bowel 7/3 OR wash out of abd, replace wound VAC, abd remains open 7/5 sched for OR today for wash out abd, replace wound VAC  Central access: 12/11/2018 TPN start date: 11/30/18  ASSESSMENT                                                                                                          Current Nutrition: NPO IVF: On CRRT to keep net neutral I/O  Today:   Glucose (goal 100-150) - Hx DM on 70/30 Novolog 20 units BID PTA.  CBGs acceptable  25 units insulin in TPN + ICU resistant scale; 3 units SSI required yesterday  Electrolytes - on CRRT; WNL x phos 2.4 - slightly low  Renal - AKI; SCr, bicarb normalized on CRRT, BUN remains elevated  LFTs - albumin remains low; alk phos 225  TGs - WNL (6/30)  Prealbumin - down to 5.8 6/30, reflects inflammation and critical illness,  NUTRITIONAL GOALS  RD recs: (6/29):  Kcal:1514 kcal, Protein:195-215 grams Fluid:>/= 1.8 L/day  PLAN                                                                                                                         At 1800 today:  Continue TPN at 75 ml/hr - no lipids today due to national backorder  custom TPN with lipids at goal rate of 75 ml/hr provides: 198 g/day protein, 1512 Kcal/day = 100% support  Electrolytes: continue to hold Mag, small K, increased Na, increased phos, Cl:Ac = 1:2  TPN to contain standard multivitamins, trace elements MWF only due to national backorder.  IVF per MD  Continue ICU resistant-scale glycemic control orders q6h; continue 25 units regular insulin in TPN   TPN lab panels Mondays & Thursdays  Ordered BID renal function panels while on CRRT  Eudelia Bunch, Pharm.D 12/22/2018 7:29 AM

## 2018-12-19 NOTE — Progress Notes (Signed)
Pt hgb 6.0 & BP 57/27 (32)   MD notified  Orders received & Intiated: 551mL bolus, 3U PRBC, INR/PT lab drawn

## 2018-12-19 NOTE — Anesthesia Preprocedure Evaluation (Deleted)
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%) and + DVT (IJ)       Neuro/Psych negative neurological ROS  negative psych ROS   GI/Hepatic Neg liver ROS, Perforated bowel w/ open abdomen s/p multiple surgeries   Endo/Other  diabetes, Type 2  Renal/GU ARF and DialysisRenal disease  negative genitourinary   Musculoskeletal negative musculoskeletal ROS (+)   Abdominal   Peds  Hematology  (+) anemia ,   Anesthesia Other Findings   Reproductive/Obstetrics                             Anesthesia Physical  Anesthesia Plan  ASA: IV  Anesthesia Plan: General   Post-op Pain Management:    Induction: Inhalational and Intravenous  PONV Risk Score and Plan: 3 and Ondansetron, Dexamethasone, Midazolam and Treatment may vary due to age or medical condition  Airway Management Planned: Oral ETT  Additional Equipment: None  Intra-op Plan:   Post-operative Plan: Post-operative intubation/ventilation  Informed Consent:   Plan Discussed with: CRNA, Anesthesiologist and Surgeon  Anesthesia Plan Comments: (Case postponed by surgeon)       Anesthesia Quick Evaluation

## 2018-12-19 NOTE — Progress Notes (Signed)
Carmen Stone   Subjective:   This is a 63 year old lady with acute kidney injury last baseline 0.79 06/04/2018.  She had acute oliguric acute tubular necrosis in the setting of septic shock from bowel perforation.  Was on CRRT from 6/ 9  to  6/12 was held after cardiac arrest.  She resumed 12/12/2018 after abdominal closure.  Still no signs of renal recovery.  Status post small bowel perforation due to closed-loop SBO and septic shock followed by CCM underwent closure 12/11/2018 new recent bowel leak due to nonhealing anastomosis.  Went back to the OR 11/22/2018 for resection of anastomosis.  She went back to the OR 12/11/2018.  She has a large wound VAC.  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  No history of diabetes mellitus type 2.  Urine output 84 cc 12/18/2018.  Has been kept even on CRRT  Blood pressure 102/41 pulse 117 temperature 96.8 O2 sats 100% FiO2 30%  Sodium 135 potassium 4.3 chloride 105 CO2 24 BUN 45 creatinine 0.94 glucose 147 calcium 7.1 phosphorus 2.4 Albumin 1.3 WBC 33,000 hemoglobin 6.0 platelets 177 WBC 23.4 hemoglobin 7.0 platelets 194  IV heparin IV TPN  IV Zosyn 3.375 g    CT scan abdomen 12/09/2018 shows large amount of intraperitoneal free fluid which is progressed to large volumes ascites.  Prominent intraperitoneal free air collection under the anterior abdominal wall with gas scattered in the central small bowel mesentery.  Patchy airspace disease.  Levothyroxine 12.5 mcg IV daily Protonix 40 mg IV every 24 hours  Chest x-ray revealed clear lungs except for mild atelectasis right base   Objective:  Vital signs in last 24 hours:  Temp:  [95.7 F (35.4 C)-97.9 F (36.6 C)] 96.3 F (35.7 C) (07/05 0800) Pulse Rate:  [85-117] 117 (07/05 0715) Resp:  [16-28] 28 (07/05 0800) BP: (103-146)/(39-60) 103/52 (07/05 0800) SpO2:  [98 %-100 %] 100 % (07/04 2329) FiO2 (%):  [30 %] 30 % (07/05  0745)  Weight change:  Filed Weights   12/15/18 0600 12/16/18 0500 12/18/2018 0500  Weight: 67.1 kg 67.1 kg 67.4 kg    Intake/Output: I/O last 3 completed shifts: In: 5126.3 [I.V.:3596.4; Blood:660; IV Piggyback:869.9] Out: 6644 [Urine:90; Emesis/NG output:115; Drains:935; IHKVQ:2595]   Intake/Output this shift:  Total I/O In: 227.4 [I.V.:84.9; Blood:142.5] Out: 59 [Urine:8; Drains:50; Other:1] Intubated and sedated making grimacing faces. CVS- RRR no JVP RS- CTA intubated with rhonchorous breath sounds ABD- BS present soft non-distended EXT-1-2+ lower extremity edema   Basic Metabolic Panel: Recent Labs  Lab 12/15/18 0522  12/16/18 0600  01/13/2019 0419 01/10/2019 1618 12/18/18 0500 12/18/18 1612 12/28/2018 0500  NA 137   < > 137   < > 137 137 136 136 135  K 4.1   < > 4.1   < > 4.2 4.0 4.3 4.2 4.3  CL 103   < > 104   < > 104 105 103 105 105  CO2 25   < > 26   < > _0 GLUCOSE 113*   < > 114*   < > 118* 133* 97 116* 147*  BUN 44*   < > 45*   < > 48* 57* 45* 42* 45*  CREATININE 1.06*   < > 0.99   < > 1.00 1.18* 1.01* 0.88 0.94  CALCIUM 7.5*   < > 7.6*   < > 7.5* 6.8* 7.5* 7.5* 7.1*  MG 2.2  --  2.2  --  2.2  --  2.2  --  2.2  PHOS 1.9*   < > 2.2*   < > 2.1* 3.7 2.4* 2.3* 2.4*   < > = values in this interval not displayed.    Liver Function Tests: Recent Labs  Lab 11/15/2018 0500  12/16/18 0600  12/21/2018 0419 12/31/2018 1618 12/18/18 0500 12/18/18 1612 12/24/2018 0500  AST 30  --  24  --   --   --   --   --   --   ALT 27  --  22  --   --   --   --   --   --   ALKPHOS 217*  --  225*  --   --   --   --   --   --   BILITOT 1.0  --  0.4  --   --   --   --   --   --   PROT 5.7*  --  6.0*  --   --   --   --   --   --   ALBUMIN 1.5*  1.4*   < > 1.4*   < > 1.3* 1.4* 1.4* 1.4* 1.3*   < > = values in this interval not displayed.   No results for input(s): LIPASE, AMYLASE in the last 168 hours. No results for input(s): AMMONIA in the last 168 hours.  CBC: Recent  Labs  Lab 12/07/2018 0500  12/16/18 0600 12/16/18 1806 01/02/2019 0419 12/18/18 0500 12/18/18 1916 01/05/2019 0500  WBC 22.8*   < > 22.4*  --  21.0* 23.4* 24.6* 33.0*  NEUTROABS 17.3*  --   --   --   --   --   --   --   HGB 9.2*   < > 6.7* 7.2* 6.7* 7.0* 6.8* 6.0*  HCT 28.5*   < > 21.6* 22.8* 20.7* 22.4* 21.3* 18.4*  MCV 92.8   < > 96.4  --  94.1 96.6 97.3 95.3  PLT 120*   < > 135*  --  160 194 243 177   < > = values in this interval not displayed.    Cardiac Enzymes: No results for input(s): CKTOTAL, CKMB, CKMBINDEX, TROPONINI in the last 168 hours.  BNP: Invalid input(s): POCBNP  CBG: Recent Labs  Lab 12/18/18 0053 12/18/18 0553 12/18/18 1119 12/18/18 1714 01/07/2019 0000  GLUCAP 92 103* 96 96 105*    Microbiology: Results for orders placed or performed during the hospital encounter of 11/17/2018  SARS Coronavirus 2 (CEPHEID - Performed in [facility] hospital lab), Hosp Order     Status: None   Collection Time: 11/20/2018  3:55 AM   Specimen: Nasopharyngeal Swab  Result Value Ref Range Status   SARS Coronavirus 2 NEGATIVE NEGATIVE Final    Comment: (Stone) If result is NEGATIVE SARS-CoV-2 target nucleic acids are NOT DETECTED. The SARS-CoV-2 RNA is generally detectable in upper and lower  respiratory specimens during the acute phase of infection. The lowest  concentration of SARS-CoV-2 viral copies this assay can detect is 250  copies / mL. A negative result does not preclude SARS-CoV-2 infection  and should not be used as the sole basis for treatment or other  patient management decisions.  A negative result may occur with  improper specimen collection / handling, submission of specimen other  than nasopharyngeal swab, presence of viral mutation(s) within the  areas targeted by this assay, and inadequate number of viral copies  (<250 copies /  mL). A negative result must be combined with clinical  observations, patient history, and epidemiological information. If result  is POSITIVE SARS-CoV-2 target nucleic acids are DETECTED. The SARS-CoV-2 RNA is generally detectable in upper and lower  respiratory specimens dur ing the acute phase of infection.  Positive  results are indicative of active infection with SARS-CoV-2.  Clinical  correlation with patient history and other diagnostic information is  necessary to determine patient infection status.  Positive results do  not rule out bacterial infection or co-infection with other viruses. If result is PRESUMPTIVE POSTIVE SARS-CoV-2 nucleic acids MAY BE PRESENT.   A presumptive positive result was obtained on the submitted specimen  and confirmed on repeat testing.  While 2019 novel coronavirus  (SARS-CoV-2) nucleic acids may be present in the submitted sample  additional confirmatory testing may be necessary for epidemiological  and / or clinical management purposes  to differentiate between  SARS-CoV-2 and other Sarbecovirus currently known to infect humans.  If clinically indicated additional testing with an alternate test  methodology [phone]) is advised. The SARS-CoV-2 RNA is generally  detectable in upper and lower respiratory sp ecimens during the acute  phase of infection. The expected result is Negative. Fact Sheet for Patients:  StrictlyIdeas.no Fact Sheet for Healthcare Providers: BankingDealers.co.za This test is not yet approved or cleared by the [place] FDA and has been authorized for detection and/or diagnosis of SARS-CoV-2 by FDA under an Emergency Use Authorization (EUA).  This EUA will remain in effect (meaning this test can be used) for the duration of the COVID-19 declaration under Section 564(b)(1) of the Act, 21 U.S.C. section 360bbb-3(b)(1), unless the authorization is terminated or revoked sooner. Performed at [facility], [place] [address] [place], [place] [address]   MRSA PCR Screening     Status: None    Collection Time: 11/22/18 10:14 AM   Specimen: Nasal Mucosa; Nasopharyngeal  Result Value Ref Range Status   MRSA by PCR NEGATIVE NEGATIVE Final    Comment:        The GeneXpert MRSA Assay (FDA approved for NASAL specimens only), is one component of a comprehensive MRSA colonization surveillance program. It is not intended to diagnose MRSA infection nor to guide or monitor treatment for MRSA infections. Performed at [facility], [place] [address], [place], [place] [address]   Culture, respiratory (non-expectorated)     Status: None   Collection Time: 12/05/18 11:25 AM   Specimen: Tracheal Aspirate; Respiratory  Result Value Ref Range Status   Specimen Description   Final    TRACHEAL ASPIRATE Performed at [place] [address], [place], [place] [address]    Special Requests   Final    NONE Performed at [facility], [place] [address] [place], [place] [address]    Gram Stain   Final    RARE WBC PRESENT, PREDOMINANTLY PMN NO ORGANISMS SEEN Performed at [facility], [place] [address] [place],  [address]    Culture RARE ESCHERICHIA COLI  Final   Report Status 12/07/2018 FINAL  Final   Organism ID, Bacteria ESCHERICHIA COLI  Final      Susceptibility   Escherichia coli - MIC*    AMPICILLIN >=32 RESISTANT Resistant     CEFAZOLIN <=4 SENSITIVE Sensitive     CEFEPIME <=1 SENSITIVE Sensitive     CEFTAZIDIME <=1 SENSITIVE Sensitive     CEFTRIAXONE <=1 SENSITIVE Sensitive     CIPROFLOXACIN <=0.25 SENSITIVE Sensitive  GENTAMICIN <=1 SENSITIVE Sensitive     IMIPENEM <=0.25 SENSITIVE Sensitive     TRIMETH/SULFA <=20 SENSITIVE Sensitive     AMPICILLIN/SULBACTAM >=32 RESISTANT Resistant     PIP/TAZO <=4 SENSITIVE Sensitive     Extended ESBL NEGATIVE Sensitive     * RARE ESCHERICHIA COLI  Culture, blood (routine x 2)     Status: None   Collection Time: 12/07/18 10:50 AM   Specimen: BLOOD LEFT ARM  Result  Value Ref Range Status   Specimen Description   Final    BLOOD LEFT ARM Performed at [place] [address] [place], [place] [address]    Special Requests   Final    BOTTLES DRAWN AEROBIC AND ANAEROBIC Blood Culture adequate volume Performed at [place] [address], [place], [place] [address]    Culture   Final    NO GROWTH 5 DAYS Performed at [facility], [place] [address] [place], [place] [address]    Report Status 12/12/2018 FINAL  Final  Culture, blood (routine x 2)     Status: None   Collection Time: 11/30/2018 12:24 PM   Specimen: BLOOD  Result Value Ref Range Status   Specimen Description   Final    BLOOD RIGHT ARM Performed at [place] [address], [place], [place] [address]    Special Requests   Final    BOTTLES DRAWN AEROBIC AND ANAEROBIC Blood Culture adequate volume Performed at [place] [address] [place], [place] [address]    Culture   Final    NO GROWTH 5 DAYS Performed at [facility], [place] [address] [place], [place] [address]    Report Status 12/15/2018 FINAL  Final  Culture, blood (routine x 2)     Status: None   Collection Time: 11/20/2018  1:03 PM   Specimen: BLOOD  Result Value Ref Range Status   Specimen Description   Final    BLOOD RIGHT ANTECUBITAL Performed at [place] [address], [place], [place] [address]    Special Requests   Final    BOTTLES DRAWN AEROBIC ONLY Blood Culture adequate volume Performed at [place] [address] [place], [place] [address]    Culture   Final    NO GROWTH 5 DAYS Performed at [facility], [place] [address], [place], [place] [address]    Report Status 12/15/2018 FINAL  Final    Coagulation Studies: Recent Labs    12/30/2018 0628  LABPROT 14.9  INR 1.2    Urinalysis: No results for input(s): COLORURINE, LABSPEC, PHURINE, GLUCOSEU, HGBUR,  BILIRUBINUR, KETONESUR, PROTEINUR, UROBILINOGEN, NITRITE, LEUKOCYTESUR in the last 72 hours.  Invalid input(s): APPERANCEUR    Imaging: Dg Chest Port 1 View  Result Date: 12/18/2018 CLINICAL DATA:  Followup ventilator support EXAM: PORTABLE CHEST 1 VIEW COMPARISON:  12/21/2018 FINDINGS: Endotracheal tube tip is 3 cm above the carina. Orogastric or nasogastric tube enters the stomach. Left subclavian power port tip in the SVC 1 cm above the right atrium. Right subclavian central line tip at the SVC RA junction. Right internal jugular central line tip in the SVC 4 cm above the right atrium. The vascularity is normal. The lungs are clear except for mild volume loss at the right base. IMPRESSION: Lines and tubes unchanged and well positioned. Lungs clear except for mild atelectasis at the right base. Electronically Signed   By: Nelson Chimes  M.D.   On: 12/18/2018 06:53     Medications:   .  prismasol BGK 4/2.5 400 mL/hr at 12/18/18 1949  .  prismasol BGK 4/2.5 200 mL/hr at 12/18/18 2242  . sodium chloride 10 mL/hr at 01/04/2019 0800  . anidulafungin Stopped (12/18/18 1650)  . fentaNYL infusion INTRAVENOUS Stopped (12/24/2018 0603)  . piperacillin-tazobactam (ZOSYN)  IV Stopped (01/04/2019 0540)  . prismasol BGK 4/2.5 1,500 mL/hr at 12/18/18 2317  . TPN ADULT (ION) 75 mL/hr at 01/05/2019 0800  . TPN ADULT (ION)     . sodium chloride   Intravenous Once  . sodium chloride   Intravenous Once  . chlorhexidine gluconate (MEDLINE KIT)  15 mL Mouth Rinse BID  . Chlorhexidine Gluconate Cloth  6 each Topical Daily  . dorzolamide-timolol  1 drop Both Eyes BID  . insulin aspart  3-9 Units Subcutaneous Q6H  . levothyroxine  12.5 mcg Intravenous Daily  . mouth rinse  15 mL Mouth Rinse 10 times per day  . pantoprazole (PROTONIX) IV  40 mg Intravenous Q24H   sodium chloride, albuterol, alteplase, fentaNYL (SUBLIMAZE) injection, heparin, hydrALAZINE, metoprolol tartrate, sodium chloride, sodium chloride  flush  Assessment/ Plan:   Acute kidney injury with very little signs of recovery.  She is anuric she is requiring CRRT.  Baseline serum creatinine 0.9.  Acute kidney injury following perforated small bowel with fecal peritonitis and wound debridement.  Hypotension/volumes  continue CRRT and keep even  Electrolytes appear to be stable at this time  Acid-base balance stable at this time  Anemia with acute drop in hemoglobin defer to primary team  Perforated small bowel with acute kidney injury 6/9 to 11/26/2018.  New recent bowel leak due to nonhealing of anastomosis went to the OR 11/30/2018 11/20/2018 appreciate assistance from Dr. Barry Dienes.  Wound VAC  Diabetes mellitus per primary team  Nutrition continues on TNA  Hypophosphatemia resolved added with TNA  Antimicrobials continues on Zosyn.  It appears that Eraxis was discontinued 12/18/2018     LOS: [place] _0 _1 :00 AM

## 2018-12-19 NOTE — Progress Notes (Signed)
NAME:  Carmen Stone, MRN:  [id number], DOB:  Jan 16, 1956, LOS: 68 ADMISSION DATE:  12/05/2018, CONSULTATION DATE:  11/21/2018 REFERRING MD:  Dr. Ninfa Linden, Surgery, CHIEF COMPLAINT:  Abdominal pain   Brief History   63 y/o female presented to ER 12/09/2018 with nausea, vomiting, abdominal pain.  CT abd/pelvis showed focal perforated loop of SB with free air with multiple complex ventral hernias.  Underwent emergent laparotomy with SB resection, lysis of adhesions and wound vac.  Remained on vent and pressors post op.  Hospital course complicated by respiratory arrest 6/13, multiple reintubations, intermittent vasopressor needs, AKI on CVVHD.     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 Changing volume goal on CRRT to -100 cc an hour. Weaned 7 hours PSV   6/16 Encephalopathic. Hypertensive, adding labetalol.  IV TNA started 6/17 A little more awake.  Tolerating PSV but SBI exceeds 105.  Volume removal with CVVHD 6/18 Neuro status improved.  Required brief vasoactive drips.  Net negative fluid volume status.PSV 6/19 Extubated 6/20 reintubated for altered mentation, head CT negative  6/21 Tx PRBC x1.  Off vasopressors 6/23 Febrile overnight, Blood Cx. Transfuse 1 U PRBC 6/24 Afebrile. Remains off pressors. 100cc/hr removal /c CRRT. 30% FiO2.  6/25 L IJ, subclavian, axillary  + DVT. Heparin initiated. Remains on CRRT.  6/26  CT ABD/pelvis positive for free air and ascites. Back to OR  for ex lap of perforated viscous, resection of previous anastomosis, application of wound vac.  1U RBCs intra op.   6/28 Back on pressors with hgb <7 / sedated on fent drip, PRBC 6/29 Pending OR for possible closure  7/01 Less sedation, following commands, abd open with VAC, on CVVHD (even) 7/03 OR, unable to close but bowel "looks some better", hematoma in abd, on CVVHD 7/05 hemoglobin drop with red drainage from wound vac, heparin gtt held, transfuse PRBC  Consults:  Renal 6/09 AKI, acidosis Cardiology 6/09 Takotsubo CM  Palliative care 6/30 Goals of care  Procedures:  ETT 6/07 >> 6/19, 6/20 >> Rt IJ HD 6/09 >> Rt [place] CVL 6/09 >> Lt radial aline 6/09 >> discontinued  Significant Diagnostic Tests:  CT abd/pelvis 6/07 >> focal perforated loop of SB with free air with multiple complex ventral hernias Echo 6/08 >> EF 20%, Takotsubo CM CT head 6/20 >> negative for any acute findings CT abdomen 6/20 >> bilateral effusions, atelectasis left lower lobe, results noted.    CT Chest 6/20 >> small bilateral pleural effusions with compressive atelectasis, anterior R 4-7 rib fractures CT ABD/Pelvis 6/20 >> small bowel surgical changes, mild circumferential wall thickening of scattered small bowel loops, small to moderate ascites LUE Duplex 6/24 >> DVT L IJ, subclavian, axillary, SVT L cephalic  CT ABD/Pelvis 1/27 >> large volume ascites with intraperitoneal free air   Micro Data:  COVID 6/07 >> negative Tracheal aspirate 6/20 >> E Coli sensitive to zosyn BCx2 6/23 >> negative BCx2  6/26 >> negative   Antimicrobials:  Zosyn 6/07 >> 6/17 Diflucan 6/07 >> 6/10 Cefepime 6/21 >>6/26 Vanco 6/21 >> 6/25 Anidulafungin 6/26 >> Zosyn  6/26 >>  Interim history/subjective:  Getting PRBC transfusion.  Hypotensive overnight.  Remains on CRRT, full vent support.  Objective   Blood pressure (!) 115/47, pulse (!) 117, temperature (!) 95.7 F (35.4 C), temperature source Bladder, resp. rate 20, height 5\' 4"  (1.626 m), weight 67.4 kg, SpO2 100 %.    Vent Mode: PRVC FiO2 (%):  [30 %] 30 % Set  Rate:  [18 bmp] 18 bmp Vt Set:  [430 mL] 430 mL PEEP:  [5 cmH20] 5 cmH20 Plateau Pressure:  [15 cmH20-24 cmH20] 17 cmH20   Intake/Output Summary (Last 24 hours) at 12/16/2018 0810 Last data filed at 01/06/2019 0800 Gross per 24 hour  Intake 3881.56 ml  Output 3139 ml  Net 742.56 ml   Filed Weights   12/15/18 0600 12/16/18 0500 12/31/2018 0500  Weight: 67.1 kg 67.1 kg 67.4 kg    Examination:  General - sedated Eyes - pupils reactive ENT - ETT in place Cardiac - regular rate/rhythm, no murmur Chest - decreased BS, no wheeze Abdomen - soft, wound vac in place with dark red drainage Extremities - no edema Skin - no rashes Neuro - RASS -1   Resolved Hospital Problem list   Metabolic alkalosis, Abdominal peritonitis with septic shock, Circulatory shock, Cardiorespiratory arrest 6/13, Shock liver, Hypophosphatemia, thrombocytopenia  Assessment & Plan:   Acute hypoxic respiratory failure in setting of septic shock with peritonitis. Failure to wean - failed extubation attempt 3 times. Plan - full vent support - f/u CXR - if family wishes to continue aggressive care, then she will need trach to assist with longer term vent support - d/w surgery on 7/04 about arranging for trach next time she has OR trip  Small Bowel Perforation s/p Resection, Lysis of Adhesions. -reexploration 6/14, creation of functional end to end small bowel anastomosis and closure of abdominal wound -reexploration 6/26 with pervious anastomosis broken down with holes in same s/p resection of anastomosis with placement of wound vac -6/29 OR findings of pus in the abd, bilious inflammatory rind on intestines & additional perforation -7/03 OR with hematoma, improvement in bowel appearance, not closed  Plan - post op care, nutrition per [facility] - tentative plan to return to OR 7/06 unless she has more bleeding, then sooner  Acute Metabolic Encephalopathy. Plan - RASS goal -1 to -2 until abdominal surgical issues  more stable  Fever. - could be related to abdominal hematoma - E coli also in sputum from 6/21 Plan - day 9/14 of zosyn, anidulafungin  Anemia from blood loss with intraabdominal hematoma. 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 Plan - monitor hemodynamics - hold outpt lisinopril, pravachol - f/u Echo when more stable - prn hydralazine for SBP > 160  Acute kidney injury from tubular necrosis. -baseline creatinine of 0.73 from 11/19/2018 Plan - continue CRRT, keep even to positive fluid balance  Type 2 Diabetes. Plan - SSI  Moderate to Severe Protein Calorie Malnutrition.  Plan - continue TPN  Acute DVT- Left IJ, subclavian, axillary. Plan - hold heparin gtt until abdominal hematoma improved/resolved  Hypothyroidism. Plan - IV synthroid  Goals of care. Plan - ongoing d/w family and palliative care; long term prognosis for meaningful recovery seems poor  Best practice:  Diet: TPN. DVT prophylaxis: SCDs GI prophylaxis: Protonix Mobility: Bedrest Code Status: Full code Disposition: ICU.    Labs:   CMP Latest Ref Rng &  Units 12/29/2018 12/18/2018 12/18/2018  Glucose 70 - 99 mg/dL 147(H) 116(H) 97  BUN 8 - 23 mg/dL 45(H) 42(H) 45(H)  Creatinine 0.44 - 1.00 mg/dL 0.94 0.88 1.01(H)  Sodium 135 - 145 mmol/L 135 136 136  Potassium 3.5 - 5.1 mmol/L 4.3 4.2 4.3  Chloride 98 - 111 mmol/L 105 105 103  CO2 22 - 32 mmol/L 24 26 26   Calcium 8.9 - 10.3 mg/dL 7.1(L) 7.5(L) 7.5(L)  Total Protein 6.5 - 8.1 g/dL - - -  Total Bilirubin 0.3 - 1.2 mg/dL - - -  Alkaline Phos 38 - 126 U/L - - -  AST 15 - 41 U/L - - -  ALT 0 - 44 U/L - - -   CBC Latest Ref Rng & Units 12/28/2018 12/18/2018 12/18/2018  WBC 4.0 - 10.5 K/uL 33.0(H) 24.6(H) 23.4(H)  Hemoglobin 12.0 - 15.0 g/dL 6.0(LL) 6.8(LL) 7.0(L)  Hematocrit 36.0 - 46.0 % 18.4(L) 21.3(L) 22.4(L)  Platelets 150 - 400 K/uL 177 243 194     CBG (last 3)  Recent Labs    12/18/18 1119 12/18/18 1714 01/06/2019 0000  GLUCAP 96 96 105*   CC time 33 minutes  Chesley Mires, MD Home 12/25/2018, 8:10 AM

## 2018-12-19 NOTE — Progress Notes (Addendum)
eLink Physician-Brief Progress Note Patient Name: Carmen Stone DOB: September 12, 1955 MRN: [id number]   Date of Service  12/30/2018  HPI/Events of Note  63 year old female had abdominal surgery and lap site is bleeding.  Hemoglobin 6.  Supposed to go to surgery today  eICU Interventions  Ordered 1 unit of packed RBC for transfusion.       Intervention Category Intermediate Interventions: Communication with other healthcare providers and/or family;Bleeding - evaluation and treatment with blood products  Ephriam Jenkins Magen Suriano 12/21/2018, 5:58 AM   6:18 AM Hypotension with a blood pressure of 63/23 with a map of 35. 1 unit of packed RBC transfusion is pending.  Meanwhile ordered 500 mL of normal saline bolus and can resume Levophed to maintain map more than 65 or systolic more than 90, either.

## 2018-12-19 NOTE — Progress Notes (Signed)
Pharmacy Antibiotic Note  Carmen Stone is a 63 y.o. female admitted on 12/05/2018 with perforated loop of SB. Her hospital stay has been complicated by cardiac arrest requiring intubation & pressors as well as CRRT for AKI.  She completed 10d course of Zosyn on 6/17.   She had low grade fever of 100.66F & bradycardia requiring pressors to be resumed today.  WBC has also been trending up off Zosyn.  Chest CT 6/20 could not rule out pneumonia.  Pharmacy was consulted for Vancomycin & Cefepime dosing.  6/23: new fevers, BCx ordered, removed 1 of 2 central lines, changed Foley, using Port for TPN 6/26 Fevers this AM and taken back to OR this AM revealing perforated bowel, cefepime changed to Zosyn + Eraxis 6/29 OR pus throughout abdomen. Small hole in small bowel 7/5 to OR for wash out,  Wound VAC exchange 01/03/2019 WBC remains elevated,up to 33 today. AF/hypothermic, on CRRT, intubated and sedated, off levophed 7/1 AM. To OR again today for abd wash out and wound VAC exchange On full day ABX since 6/21, on zosyn since 6/26 - D#10 zosyn  Plan:  Zosyn 3.375 g IV q6 hrs each dose over 30 mins while on CRRT  Eraxis per MD, dosing appropriate  Height: 5\' 4"  (162.6 cm) Weight: (bed weight not working this morning) IBW/kg (Calculated) : 54.7  Temp (24hrs), Avg:96.9 F (36.1 C), Min:95.9 F (35.5 C), Max:97.9 F (36.6 C)  Recent Labs  Lab 12/16/18 0600  12/28/2018 0419 12/29/2018 1618 12/18/18 0500 12/18/18 1612 12/18/18 1916 01/10/2019 0500  WBC 22.4*  --  21.0*  --  23.4*  --  24.6* 33.0*  CREATININE 0.99   < > 1.00 1.18* 1.01* 0.88  --  0.94   < > = values in this interval not displayed.    Estimated Creatinine Clearance: 57.1 mL/min (by C-G formula based on SCr of 0.94 mg/dL).    No Known Allergies  Antimicrobials this admission: 6/7 zosyn > 6/17  6/26 Zosyn >> 6/7 fluconazole > 6/11 6/8 vancomycin >> 6/8, resumed 6/21>> 6/24 6/21 cefepime>> 6/26 6/26 Eraxis >>  Dose  adjustments this admission:  Microbiology results: 6/7 Covid 19 negative 6/8 MRSA PCR: negative 6/21 Tracheal aspirate: rare E.Coli; R to ampicillin and Unasyn F 6/23 BCx (x1): ngF 6/26 BCx2: ngF  Thank you for allowing pharmacy to be a part of this patient's care.  Eudelia Bunch, Pharm.D [phone] 01/14/2019 7:12 AM

## 2018-12-19 NOTE — Progress Notes (Signed)
2 Days Post-Op   Subjective/Chief Complaint: Events noted.  By report, had bright blood in VAC now stopped.  hgb dropped.  Has been on IV heparin which is being held.  SBP >100  Objective: Vital signs in last 24 hours: Temp:  [95.7 F (35.4 C)-97.9 F (36.6 C)] 95.7 F (35.4 C) (07/05 0715) Pulse Rate:  [80-117] 117 (07/05 0715) Resp:  [16-28] 20 (07/05 0715) BP: (103-155)/(39-66) 115/47 (07/05 0715) SpO2:  [98 %-100 %] 100 % (07/04 2329) FiO2 (%):  [30 %] 30 % (07/05 0337) Last BM Date: 01/11/19  Intake/Output from previous day: 07/04 0701 - 07/05 0700 In: 3759.1 [I.V.:2328.4; Blood:660; IV Piggyback:770.8] Out: 36 [Urine:61; Emesis/NG output:65; Drains:695] Intake/Output this shift: Total I/O In: 30 [Blood:30] Out: -   Exam: On Vent Abdomen soft, Non-distended, VAC in place and maintaining suction.  No gross bleeding evident  NG clear  No blood per rectum  Lab Results:  Recent Labs    12/18/18 1916 12/24/2018 0500  WBC 24.6* 33.0*  HGB 6.8* 6.0*  HCT 21.3* 18.4*  PLT 243 177   BMET Recent Labs    12/18/18 1612 12/30/2018 0500  NA 136 135  K 4.2 4.3  CL 105 105  CO2 26 24  GLUCOSE 116* 147*  BUN 42* 45*  CREATININE 0.88 0.94  CALCIUM 7.5* 7.1*   PT/INR Recent Labs    12/25/2018 0628  LABPROT 14.9  INR 1.2   ABG No results for input(s): PHART, HCO3 in the last 72 hours.  Invalid input(s): PCO2, PO2  Studies/Results: Dg Chest Port 1 View  Result Date: 12/18/2018 CLINICAL DATA:  Followup ventilator support EXAM: PORTABLE CHEST 1 VIEW COMPARISON:  12/24/2018 FINDINGS: Endotracheal tube tip is 3 cm above the carina. Orogastric or nasogastric tube enters the stomach. Left subclavian power port tip in the SVC 1 cm above the right atrium. Right subclavian central line tip at the SVC RA junction. Right internal jugular central line tip in the SVC 4 cm above the right atrium. The vascularity is normal. The lungs are clear except for mild volume loss at the  right base. IMPRESSION: Lines and tubes unchanged and well positioned. Lungs clear except for mild atelectasis at the right base. Electronically Signed   By: Nelson Chimes M.D.   On: 12/18/2018 06:53    Anti-infectives: Anti-infectives (From admission, onward)   Start     Dose/Rate Route Frequency Ordered Stop   12/04/2018 1030  clindamycin (CLEOCIN) 900 mg, gentamicin (GARAMYCIN) 240 mg in sodium chloride 0.9 % 1,000 mL for intraperitoneal lavage      Irrigation To Surgery 12/08/2018 1026 11/20/2018 1042   12/11/18 1500  anidulafungin (ERAXIS) 100 mg in sodium chloride 0.9 % 100 mL IVPB     100 mg 78 mL/hr over 100 Minutes Intravenous Every 24 hours 12/10/2018 1007     12/11/18 0600  piperacillin-tazobactam (ZOSYN) IVPB 3.375 g     3.375 g 100 mL/hr over 30 Minutes Intravenous Every 6 hours 12/11/18 0317     11/29/2018 1800  piperacillin-tazobactam (ZOSYN) IVPB 2.25 g  Status:  Discontinued     2.25 g 100 mL/hr over 30 Minutes Intravenous Every 6 hours 11/24/2018 1413 12/11/18 0317   12/05/2018 1100  anidulafungin (ERAXIS) 200 mg in sodium chloride 0.9 % 200 mL IVPB     200 mg 78 mL/hr over 200 Minutes Intravenous  Once 11/19/2018 1007 12/12/2018 1456   12/01/2018 1100  piperacillin-tazobactam (ZOSYN) IVPB 3.375 g     3.375 g  100 mL/hr over 30 Minutes Intravenous  Once 12/09/2018 1027 12/01/2018 1245   12/08/18 1800  vancomycin (VANCOCIN) IVPB 750 mg/150 ml premix  Status:  Discontinued     750 mg 150 mL/hr over 60 Minutes Intravenous Every 48 hours 12/07/18 0822 12/07/18 0903   12/08/18 1000  ceFEPIme (MAXIPIME) 2 g in sodium chloride 0.9 % 100 mL IVPB  Status:  Discontinued     2 g 200 mL/hr over 30 Minutes Intravenous Every 24 hours 12/07/18 0822 12/07/18 0903   12/07/18 2200  ceFEPIme (MAXIPIME) 2 g in sodium chloride 0.9 % 100 mL IVPB  Status:  Discontinued     2 g 200 mL/hr over 30 Minutes Intravenous Every 12 hours 12/07/18 0903 11/22/2018 1027   12/07/18 1800  vancomycin (VANCOCIN) IVPB 750 mg/150 ml  premix  Status:  Discontinued     750 mg 150 mL/hr over 60 Minutes Intravenous Every 24 hours 12/07/18 0903 12/09/18 1040   12/06/18 1800  vancomycin (VANCOCIN) IVPB 750 mg/150 ml premix  Status:  Discontinued     750 mg 150 mL/hr over 60 Minutes Intravenous Every 24 hours 12/05/18 1652 12/07/18 0822   12/05/18 1800  ceFEPIme (MAXIPIME) 2 g in sodium chloride 0.9 % 100 mL IVPB  Status:  Discontinued     2 g 200 mL/hr over 30 Minutes Intravenous Every 12 hours 12/05/18 1644 12/07/18 0822   12/05/18 1700  vancomycin (VANCOCIN) 1,250 mg in sodium chloride 0.9 % 250 mL IVPB     1,250 mg 166.7 mL/hr over 90 Minutes Intravenous  Once 12/05/18 1652 12/05/18 2019   11/26/2018 1800  fluconazole (DIFLUCAN) IVPB 200 mg  Status:  Discontinued     200 mg 100 mL/hr over 60 Minutes Intravenous Every 24 hours 11/24/2018 0748 12/03/2018 0847   11/25/2018 1800  fluconazole (DIFLUCAN) IVPB 400 mg  Status:  Discontinued     400 mg 100 mL/hr over 120 Minutes Intravenous Every 24 hours 12/11/2018 0847 12/11/2018 0856   11/26/2018 1400  piperacillin-tazobactam (ZOSYN) IVPB 2.25 g  Status:  Discontinued     2.25 g 100 mL/hr over 30 Minutes Intravenous Every 6 hours 11/17/2018 0748 12/01/18 1104   11/30/2018 0600  cefoTEtan (CEFOTAN) 2 g in sodium chloride 0.9 % 100 mL IVPB     2 g 200 mL/hr over 30 Minutes Intravenous On call to O.R. 11/22/18 1300 12/06/2018 0624   11/22/18 1800  fluconazole (DIFLUCAN) IVPB 400 mg  Status:  Discontinued     400 mg 100 mL/hr over 120 Minutes Intravenous Every 24 hours 12/05/2018 1528 11/26/2018 0748   11/22/18 1800  vancomycin (VANCOCIN) 1,500 mg in sodium chloride 0.9 % 500 mL IVPB  Status:  Discontinued     1,500 mg 250 mL/hr over 120 Minutes Intravenous Every 24 hours 11/22/18 0358 11/22/18 0750   11/22/18 0115  vancomycin (VANCOCIN) 1,500 mg in sodium chloride 0.9 % 500 mL IVPB     1,500 mg 250 mL/hr over 120 Minutes Intravenous  Once 11/22/18 0101 11/22/18 0319   12/04/2018 1630  fluconazole  (DIFLUCAN) IVPB 800 mg     800 mg 100 mL/hr over 240 Minutes Intravenous  Once 11/29/2018 1527 11/22/2018 2013   11/19/2018 1600  piperacillin-tazobactam (ZOSYN) IVPB 3.375 g  Status:  Discontinued     3.375 g 12.5 mL/hr over 240 Minutes Intravenous Every 8 hours 12/12/2018 1522 11/17/2018 0748   12/02/2018 0545  piperacillin-tazobactam (ZOSYN) IVPB 3.375 g     3.375 g 100 mL/hr over 30 Minutes  Intravenous  Once 12/12/2018 0530 12/09/2018 0626   11/17/2018 0530  piperacillin-tazobactam (ZOSYN) IVPB 4.5 g  Status:  Discontinued     4.5 g 200 mL/hr over 30 Minutes Intravenous  Once 11/17/2018 0517 11/16/2018 0529      Assessment/Plan: s/p Procedure(s): [place] OUT OF ABDOMEN (N/A) APPLICATION OF WOUND VAC (N/A)   Remains critically ill.  Given findings in OR Friday and exam today, I do not suspect large active intra-abdominal bleeding.  Suspect this is very multi factorial and an indication of her over all poor prognosis. Will still plan laparotomy tomorrow with VAC change and possible reanastomosis unless things change and she will need to go to the OR today.  LOS: 28 days    Coralie Keens 12/30/2018

## 2018-12-20 ENCOUNTER — Inpatient Hospital Stay (HOSPITAL_COMMUNITY): Payer: BLUE CROSS/BLUE SHIELD

## 2018-12-20 ENCOUNTER — Encounter (HOSPITAL_COMMUNITY): Admission: EM | Disposition: E | Payer: Self-pay | Source: Home / Self Care

## 2018-12-20 ENCOUNTER — Inpatient Hospital Stay (HOSPITAL_COMMUNITY): Payer: BLUE CROSS/BLUE SHIELD | Admitting: Anesthesiology

## 2018-12-20 ENCOUNTER — Encounter (HOSPITAL_COMMUNITY): Payer: Self-pay | Admitting: Anesthesiology

## 2018-12-20 HISTORY — PX: LAPAROTOMY: SHX154

## 2018-12-20 LAB — BPAM RBC
Blood Product Expiration Date: 202007282359
Blood Product Expiration Date: 202007282359
Blood Product Expiration Date: 202007282359
Blood Product Expiration Date: 202007282359
Blood Product Expiration Date: 202008032359
Blood Product Expiration Date: 202008032359
ISSUE DATE / TIME: 202007021345
ISSUE DATE / TIME: 202007030713
ISSUE DATE / TIME: 202007042204
ISSUE DATE / TIME: 202007050645
ISSUE DATE / TIME: 202007050940
ISSUE DATE / TIME: 202007051225
Unit Type and Rh: 5100
Unit Type and Rh: 5100
Unit Type and Rh: 5100
Unit Type and Rh: 5100
Unit Type and Rh: 5100
Unit Type and Rh: 5100

## 2018-12-20 LAB — GLUCOSE, CAPILLARY
Glucose-Capillary: 114 mg/dL — ABNORMAL HIGH (ref 70–99)
Glucose-Capillary: 153 mg/dL — ABNORMAL HIGH (ref 70–99)
Glucose-Capillary: 172 mg/dL — ABNORMAL HIGH (ref 70–99)
Glucose-Capillary: 96 mg/dL (ref 70–99)
Glucose-Capillary: 96 mg/dL (ref 70–99)

## 2018-12-20 LAB — TYPE AND SCREEN
ABO/RH(D): O POS
Antibody Screen: NEGATIVE
Unit division: 0
Unit division: 0
Unit division: 0
Unit division: 0
Unit division: 0
Unit division: 0

## 2018-12-20 LAB — RENAL FUNCTION PANEL
Albumin: 1.3 g/dL — ABNORMAL LOW (ref 3.5–5.0)
Albumin: 1.3 g/dL — ABNORMAL LOW (ref 3.5–5.0)
Anion gap: 12 (ref 5–15)
Anion gap: 15 (ref 5–15)
BUN: 41 mg/dL — ABNORMAL HIGH (ref 8–23)
BUN: 40 mg/dL — ABNORMAL HIGH (ref 8–23)
CO2: 36 mmol/L — ABNORMAL HIGH (ref 22–32)
CO2: 33 mmol/L — ABNORMAL HIGH (ref 22–32)
Calcium: 9.5 mg/dL (ref 8.9–10.3)
Calcium: 8.8 mg/dL — ABNORMAL LOW (ref 8.9–10.3)
Chloride: 92 mmol/L — ABNORMAL LOW (ref 98–111)
Chloride: 92 mmol/L — ABNORMAL LOW (ref 98–111)
Creatinine, Ser: 0.96 mg/dL (ref 0.44–1.00)
Creatinine, Ser: 0.93 mg/dL (ref 0.44–1.00)
GFR calc Af Amer: >60 mL/min (ref >60)
GFR calc Af Amer: >60 mL/min (ref >60)
GFR calc non Af Amer: >60 mL/min (ref >60)
GFR calc non Af Amer: >60 mL/min (ref >60)
Glucose, Bld: 145 mg/dL — ABNORMAL HIGH (ref 70–99)
Glucose, Bld: 291 mg/dL — ABNORMAL HIGH (ref 70–99)
Phosphorus: 2.4 mg/dL — ABNORMAL LOW (ref 2.5–4.6)
Phosphorus: 2.5 mg/dL (ref 2.5–4.6)
Potassium: 3.6 mmol/L (ref 3.5–5.1)
Potassium: 3.4 mmol/L — ABNORMAL LOW (ref 3.5–5.1)
Sodium: 140 mmol/L (ref 135–145)
Sodium: 140 mmol/L (ref 135–145)

## 2018-12-20 LAB — COMPREHENSIVE METABOLIC PANEL
ALT: 19 U/L (ref 0–44)
AST: 25 U/L (ref 15–41)
Albumin: 1.4 g/dL — ABNORMAL LOW (ref 3.5–5.0)
Alkaline Phosphatase: 139 U/L — ABNORMAL HIGH (ref 38–126)
Anion gap: 16 — ABNORMAL HIGH (ref 5–15)
BUN: 41 mg/dL — ABNORMAL HIGH (ref 8–23)
CO2: 32 mmol/L (ref 22–32)
Calcium: 9.4 mg/dL (ref 8.9–10.3)
Chloride: 91 mmol/L — ABNORMAL LOW (ref 98–111)
Creatinine, Ser: 1 mg/dL (ref 0.44–1.00)
GFR calc Af Amer: >60 mL/min (ref >60)
GFR calc non Af Amer: 59 mL/min — ABNORMAL LOW (ref >60)
Glucose, Bld: 146 mg/dL — ABNORMAL HIGH (ref 70–99)
Potassium: 3.6 mmol/L (ref 3.5–5.1)
Sodium: 139 mmol/L (ref 135–145)
Total Bilirubin: 0.4 mg/dL (ref 0.3–1.2)
Total Protein: 4.9 g/dL — ABNORMAL LOW (ref 6.5–8.1)

## 2018-12-20 LAB — TRIGLYCERIDES: Triglycerides: 142 mg/dL (ref <150)

## 2018-12-20 LAB — DIFFERENTIAL
Abs Immature Granulocytes: 1.15 10*3/uL — ABNORMAL HIGH (ref 0.00–0.07)
Basophils Absolute: 0.1 10*3/uL (ref 0.0–0.1)
Basophils Relative: 1 %
Eosinophils Absolute: 0.1 10*3/uL (ref 0.0–0.5)
Eosinophils Relative: 0 %
Immature Granulocytes: 5 %
Lymphocytes Relative: 7 %
Lymphs Abs: 1.5 10*3/uL (ref 0.7–4.0)
Monocytes Absolute: 2 10*3/uL — ABNORMAL HIGH (ref 0.1–1.0)
Monocytes Relative: 9 %
Neutro Abs: 16.9 10*3/uL — ABNORMAL HIGH (ref 1.7–7.7)
Neutrophils Relative %: 78 %

## 2018-12-20 LAB — CBC
HCT: 28.2 % — ABNORMAL LOW (ref 36.0–46.0)
Hemoglobin: 9.2 g/dL — ABNORMAL LOW (ref 12.0–15.0)
MCH: 29.6 pg (ref 26.0–34.0)
MCHC: 32.6 g/dL (ref 30.0–36.0)
MCV: 90.7 fL (ref 80.0–100.0)
Platelets: 206 10*3/uL (ref 150–400)
RBC: 3.11 MIL/uL — ABNORMAL LOW (ref 3.87–5.11)
RDW: 14.9 % (ref 11.5–15.5)
WBC: 21.9 10*3/uL — ABNORMAL HIGH (ref 4.0–10.5)
nRBC: 0.5 % — ABNORMAL HIGH (ref 0.0–0.2)

## 2018-12-20 LAB — PHOSPHORUS: Phosphorus: 2.4 mg/dL — ABNORMAL LOW (ref 2.5–4.6)

## 2018-12-20 LAB — MAGNESIUM: Magnesium: 1.9 mg/dL (ref 1.7–2.4)

## 2018-12-20 LAB — PREALBUMIN: Prealbumin: 9.3 mg/dL — ABNORMAL LOW (ref 18–38)

## 2018-12-20 SURGERY — LAPAROTOMY, EXPLORATORY
Anesthesia: General

## 2018-12-20 MED ORDER — ONDANSETRON HCL 4 MG/2ML IJ SOLN
INTRAMUSCULAR | Status: AC
  Filled 2018-12-20: qty 2

## 2018-12-20 MED ORDER — FENTANYL CITRATE (PF) 100 MCG/2ML IJ SOLN
INTRAMUSCULAR | Status: DC | PRN
  Administered 2018-12-20 (×4): 50 ug via INTRAVENOUS

## 2018-12-20 MED ORDER — TRACE MINERALS CR-CU-MN-SE-ZN 10-1000-500-60 MCG/ML IV SOLN
INTRAVENOUS | Status: DC
  Filled 2018-12-20: qty 1319.2

## 2018-12-20 MED ORDER — ROCURONIUM BROMIDE 10 MG/ML (PF) SYRINGE
PREFILLED_SYRINGE | INTRAVENOUS | Status: AC
  Filled 2018-12-20: qty 10

## 2018-12-20 MED ORDER — ROCURONIUM BROMIDE 10 MG/ML (PF) SYRINGE
PREFILLED_SYRINGE | INTRAVENOUS | Status: DC | PRN
  Administered 2018-12-20: 30 mg via INTRAVENOUS
  Administered 2018-12-20: 50 mg via INTRAVENOUS
  Administered 2018-12-20: 20 mg via INTRAVENOUS
  Administered 2018-12-20: 50 mg via INTRAVENOUS

## 2018-12-20 MED ORDER — ONDANSETRON HCL 4 MG/2ML IJ SOLN
INTRAMUSCULAR | Status: DC | PRN
  Administered 2018-12-20: 4 mg via INTRAVENOUS

## 2018-12-20 MED ORDER — MIDAZOLAM HCL 2 MG/2ML IJ SOLN
INTRAMUSCULAR | Status: AC
  Filled 2018-12-20: qty 2

## 2018-12-20 MED ORDER — SODIUM CHLORIDE 0.9 % IV SOLN
INTRAVENOUS | Status: DC | PRN
  Administered 2018-12-20: 10:00:00 via INTRAVENOUS

## 2018-12-20 MED ORDER — FENTANYL CITRATE (PF) 100 MCG/2ML IJ SOLN
INTRAMUSCULAR | Status: AC
  Filled 2018-12-20: qty 2

## 2018-12-20 MED ORDER — PROPOFOL 10 MG/ML IV BOLUS
INTRAVENOUS | Status: AC
  Filled 2018-12-20: qty 20

## 2018-12-20 MED ORDER — PHENYLEPHRINE HCL-NACL 10-0.9 MG/250ML-% IV SOLN
INTRAVENOUS | Status: AC
  Filled 2018-12-20: qty 250

## 2018-12-20 MED ORDER — 0.9 % SODIUM CHLORIDE (POUR BTL) OPTIME
TOPICAL | Status: DC | PRN
  Administered 2018-12-20: 11:00:00 2000 mL

## 2018-12-20 MED ORDER — PRISMASOL B22GK 4/0 22-4 MEQ/L IV SOLN
INTRAVENOUS | Status: DC
  Administered 2018-12-20 – 2018-12-27 (×44): via INTRAVENOUS_CENTRAL
  Filled 2018-12-20 (×72): qty 5000

## 2018-12-20 MED ORDER — TRACE MINERALS CR-CU-MN-SE-ZN 10-1000-500-60 MCG/ML IV SOLN
INTRAVENOUS | Status: AC
  Administered 2018-12-20: 17:00:00 via INTRAVENOUS
  Filled 2018-12-20: qty 1319.2

## 2018-12-20 MED ORDER — SODIUM CHLORIDE 0.9 % IV SOLN
INTRAVENOUS | Status: DC | PRN
  Administered 2018-12-20: 10:00:00 40 ug/min via INTRAVENOUS

## 2018-12-20 SURGICAL SUPPLY — 50 items
APL PRP STRL LF DISP 70% ISPRP (MISCELLANEOUS)
BLADE EXTENDED COATED 6.5IN (ELECTRODE) ×1 IMPLANT
BNDG GAUZE ELAST 4 BULKY (GAUZE/BANDAGES/DRESSINGS) ×2 IMPLANT
CHLORAPREP W/TINT 26 (MISCELLANEOUS) ×1 IMPLANT
COVER MAYO STAND STRL (DRAPES) ×1 IMPLANT
COVER WAND RF STERILE (DRAPES) IMPLANT
DRAIN CHANNEL 19F RND (DRAIN) IMPLANT
DRAPE LAPAROSCOPIC ABDOMINAL (DRAPES) ×3 IMPLANT
DRAPE SHEET LG 3/4 BI-LAMINATE (DRAPES) IMPLANT
DRAPE UTILITY XL STRL (DRAPES) ×1 IMPLANT
DRAPE WARM FLUID 44X44 (DRAPES) ×1 IMPLANT
DRSG PAD ABDOMINAL 8X10 ST (GAUZE/BANDAGES/DRESSINGS) ×4 IMPLANT
ELECT REM PT RETURN 15FT ADLT (MISCELLANEOUS) ×3 IMPLANT
EVACUATOR SILICONE 100CC (DRAIN) IMPLANT
GAUZE SPONGE 4X4 12PLY STRL (GAUZE/BANDAGES/DRESSINGS) ×1 IMPLANT
GLOVE ECLIPSE 8.0 STRL XLNG CF (GLOVE) ×6 IMPLANT
GLOVE INDICATOR 8.0 STRL GRN (GLOVE) ×3 IMPLANT
GOWN STRL REUS W/TWL XL LVL3 (GOWN DISPOSABLE) ×8 IMPLANT
HANDLE SUCTION POOLE (INSTRUMENTS) ×1 IMPLANT
KIT BASIN OR (CUSTOM PROCEDURE TRAY) ×3 IMPLANT
KIT TURNOVER KIT A (KITS) IMPLANT
LEGGING LITHOTOMY PAIR STRL (DRAPES) IMPLANT
PACK GENERAL/GYN (CUSTOM PROCEDURE TRAY) ×3 IMPLANT
PAD POSITIONING PINK XL (MISCELLANEOUS) ×1 IMPLANT
RELOAD AUTO 90-4.8 TA90 GRN (ENDOMECHANICALS) ×3 IMPLANT
RELOAD STAPLE 90 GRN THCK DST (ENDOMECHANICALS) IMPLANT
SOL PREP POV-IOD 4OZ 10% (MISCELLANEOUS) ×2 IMPLANT
SPONGE LAP 18X18 RF (DISPOSABLE) IMPLANT
STAPLER 90 3.5 STAND SLIM (STAPLE) ×3
STAPLER 90 3.5 STD SLIM (STAPLE) IMPLANT
STAPLER PROXIMATE 75MM BLUE (STAPLE) ×2 IMPLANT
STAPLER VISISTAT 35W (STAPLE) ×1 IMPLANT
SUCTION POOLE HANDLE (INSTRUMENTS) ×3
SUT ETHILON 3 0 PS 1 (SUTURE) IMPLANT
SUT NOVA 1 T20/GS 25DT (SUTURE) IMPLANT
SUT PDS AB 1 CTX 36 (SUTURE) IMPLANT
SUT PDS AB 1 TP1 96 (SUTURE) ×4 IMPLANT
SUT SILK 2 0 (SUTURE)
SUT SILK 2 0 SH CR/8 (SUTURE) ×5 IMPLANT
SUT SILK 2-0 18XBRD TIE 12 (SUTURE) ×1 IMPLANT
SUT SILK 3 0 (SUTURE)
SUT SILK 3 0 SH CR/8 (SUTURE) ×1 IMPLANT
SUT SILK 3-0 18XBRD TIE 12 (SUTURE) ×1 IMPLANT
SUT VIC AB 2-0 SH 18 (SUTURE) IMPLANT
SUT VIC AB 3-0 SH 18 (SUTURE) IMPLANT
TAPE CLOTH SURG 6X10 WHT LF (GAUZE/BANDAGES/DRESSINGS) ×2 IMPLANT
TOWEL OR 17X26 10 PK STRL BLUE (TOWEL DISPOSABLE) ×4 IMPLANT
TOWEL OR NON WOVEN STRL DISP B (DISPOSABLE) ×3 IMPLANT
TRAY FOLEY MTR SLVR 14FR STAT (SET/KITS/TRAYS/PACK) IMPLANT
TRAY FOLEY MTR SLVR 16FR STAT (SET/KITS/TRAYS/PACK) IMPLANT

## 2018-12-20 NOTE — Op Note (Signed)
11/20/2018 - 01/13/2019  10:44 AM  PATIENT:  Carmen Stone  63 y.o. female  Patient Care Team: Jettie Booze, NP as PCP - General (Family Medicine) Buford Dresser, MD as PCP - Cardiology (Cardiology) [name], MD as Consulting Physician (General Surgery) Thea Silversmith, MD as Consulting Physician ([place]) Truitt Merle, MD as Consulting Physician (Hematology) Holley Bouche, NP (Inactive) as Nurse Practitioner (Nurse Practitioner)  PRE-OPERATIVE DIAGNOSIS:  Open abdomen, hx of small bowel perforation  POST-OPERATIVE DIAGNOSIS:  same  PROCEDURE:   1. Exploratory laparotomy with abdominal washout 2. Small bowel anastomosis  SURGEON:  Sharon Mt. Dema Severin, MD  ASSISTANT: Greer Pickerel, MD  ANESTHESIA:   general  COUNTS:  Sponge, needle and instrument counts were reported correct x2 at the conclusion of the operation.  EBL: 20 mL  DRAINS: None  SPECIMEN: None  COMPLICATIONS: None  FINDINGS: Old appearing blood clot coating viscera. Epic photo taken. Somewhat cocooned abdomen due to duration of open abdomen and overall course including a prior anastomotic leak.  Unable to clearly identify cecum/ascending colon. Transverse colon (vs stomach?) appeared viable. Entire upper abdomen completely socked in which obscured any anatomic identification in this location. Liver/gallbladder was not able to be visualized. Approximately 1 foot of small bowel past what was likely ligement of Treitz remaining. Two ends reached one another. Entire small bowel diffusely congested; viable. Mesentery tethered... this precludes creation of any kind of proximal ostomy/mucus fistula particularly in setting of habitus and thick abdominal wall. Small bowel proximally had partial dehiscence of prior staple line. Bowel friable and would not hold sutures. Therefore, given prior discussions, proceeded with small bowel-small bowel anastomosis - proximal jejunum to distal ileum  using GIA blue load. Serosa was fracturing with stapler application. No leakage of succus but given very poor nutrition, prolonged open abdomen, intraoperative findings today, extremely high risk anastomosis but no other options are present including diversion.    DESCRIPTION: The patient was identified in in the ICU and transported directly to the OR where she was placed on the operating room table. SCDs were in place and funcitonal. General anesthesia was induced without difficulty. She was then prepped and draped in the usual sterile fashion with betadine after removing the overlying ABThera dressing. A surgical timeout was performed indicating the correct patient, procedure, positioning and need for preoperative antibiotics.   The abdomen was copiously irrigated with warm sterile saline.  There was significant old appearing clot overlying the entire abdomen which was evacuated. Her abdomen was somewhat cocooned with fibrinous exudate coating viscera. Necrotic, liquidified fat- presumably omentum sloughed. What was most likely the transverse colon was fixed in the upper abdomen and viable. The stomach, liver, gallbladder were not visualized due to the amount of scar tissue in her upper abdomen.  Her entire upper abdomen is essentially socked in.  Two ends of small bowel are identified with Prolene on each.  Each segment is approximately 15 cm in length.  The proximal end is jejunum runs down into the deep central upper abdomen but given difficulty with anatomic identification at this location, unclear how much small bowel actually remains proximally.  Part of the staple line had already dehisced and there was some leakage of succus present on entering the abdomen.  The abdomen was coated and purulent-like exudate with necrotic omentum making this clearly contaminated case.  The distal limb of small bowel runs into the right lower quadrant but the cecum/appendix/descending colon are not able to be seen due to  the amount of scarring at this location.  Given all the scarring is relatively acute, any attempts at dissection/identification will likely result in enterotomies or colotomies.  The remaining small bowel which is estimated to be approximately 30 cm in length appears viable but diffusely edematous and not "healthy" in appearance.  The mesentery is foreshortened/tethered due to all of the above and no ostomy would reach. Given complex discussions and wishes moving forward, despite a high risk of leakage, we proceeded with small bowel anastomosis.  With even attempted placement of stay sutures, they pulled through.  Therefore a handsewn anastomosis was not selected.  The antimesenteric borders of each respective small bowel staple line were cut and a side to side, functional end to end small bowel to small bowel anastomosis created using a single firing of the GIA 75 mm blue load stapler.  With just application of the stapler, the serosa was noted to fracture.  The bowel at this level but not hold sutures well and they would tear through. The common enterotomy which also included the previous staple line was then closed using a green load TX 90 stapler. Due to how edematous the bowel was, this stapler partially failed distally towards end of staple line. This segment was therefore closed very carefully using interrupted 3-0 silk suture. All staple lines were viable. Succus was then pushed back and forth through the anastomosis and no leakage was evident. No anastomotic apex sutures were placed due to how tenuous the small bowel appeared. There was no omentum to cover the anastomosis with. The mesentery was inspected and hemostatic. All staple lines were hemostatic.  Attention was turned to closing the abdomen.  The midline fascia was able to be brought together in the midline without any significant tension.  Therefore, 2 running #1 looped PDS sutures were used to close the midline fascia.  The skin was  intentionally left open given contaminated nature of the case and her open abdomen previously. She was then allowed to emerge from general anesthesia but transported back to the ICU intubated.   I updated the family - her brother Iona Beard and his niece in person in the ICU waiting room. We discussed all of the above findings today and high potential for anastomotic failure. We discussed that there are currently no other realistic options should this anastomosis leak given that an ostomy wasn't even possible today and we discussed the probability of this anastomosis leaking again as being quite high. Their questions were answered to their satisfaction and they expressed understanding and agreement with the plan moving forward.  DISPOSITION: ICU in critical but stable condition

## 2018-12-20 NOTE — Anesthesia Postprocedure Evaluation (Signed)
Anesthesia Post Note  Patient: Carmen Stone  Procedure(s) Performed: EXPLORATORY LAPAROTOMY WITH WOUND WASHOUT AND SMALL BOWEL ANASTOMOSIS     Patient location during evaluation: SICU Anesthesia Type: General Level of consciousness: sedated Pain management: pain level controlled Vital Signs Assessment: post-procedure vital signs reviewed and stable Respiratory status: patient remains intubated per anesthesia plan Cardiovascular status: stable Postop Assessment: no apparent nausea or vomiting Anesthetic complications: no    Last Vitals:  Vitals:   12/18/2018 1230 12/24/2018 1252  BP: (!) 169/84   Pulse:    Resp: 19   Temp: (!) 35.7 C (!) 35.6 C  SpO2:      Last Pain:  Vitals:   01/11/2019 1200  TempSrc: Bladder  PainSc:                  Catalina Gravel

## 2018-12-20 NOTE — Progress Notes (Signed)
Daily Progress Note   Patient Name: Carmen Stone       Date: 12/25/2018 DOB: [birth year]  Age: 63 y.o. MRN#: [id number] Attending Physician: Nolon Nations, MD Primary Care Physician: Jettie Booze, NP Admit Date: 12/03/2018  Reason for Consultation/Follow-up: Establishing goals of care  Subjective: Saw and examined Ms. Pyle today.   I called and spoke with her brother, Iona Beard.  He reports speaking with surgery following procedure.  Plan to see how she does over the next couple of days and then make decision regarding tracheostomy.  Length of Stay: 29  Current Medications: Scheduled Meds:  . chlorhexidine gluconate (MEDLINE KIT)  15 mL Mouth Rinse BID  . Chlorhexidine Gluconate Cloth  6 each Topical Daily  . dorzolamide-timolol  1 drop Both Eyes BID  . insulin aspart  3-9 Units Subcutaneous Q6H  . levothyroxine  12.5 mcg Intravenous Daily  . mouth rinse  15 mL Mouth Rinse 10 times per day  . pantoprazole (PROTONIX) IV  40 mg Intravenous Q24H    Continuous Infusions: .  prismasol BGK 4/2.5 400 mL/hr at 12/22/2018 1411  . sodium chloride 7.5 mL/hr at 12/21/2018 1900  . calcium gluconate infusion for CRRT 70 mL/hr at 12/15/2018 1150  . fentaNYL infusion INTRAVENOUS 75 mcg/hr (12/26/2018 1900)  . piperacillin-tazobactam (ZOSYN)  IV Stopped (12/25/2018 1838)  . prismasol B22GK 4/0 1,700 mL/hr at 12/18/2018 2145  . sodium citrate 2 %/dextrose 2.5% solution 3000 mL 250 mL/hr at 12/31/2018 1844  . TPN ADULT (ION) 85 mL/hr at 01/10/2019 1900    PRN Meds: sodium chloride, albuterol, alteplase, fentaNYL (SUBLIMAZE) injection, heparin, hydrALAZINE, metoprolol tartrate, sodium chloride, sodium chloride flush  Physical Exam         General: Intubated.  Not awake today. Heart: Regular rate and rhythm.  No murmur appreciated. Lungs: Good air movement  Abdomen: Vac in place.  Ext: No significant edema Skin: Warm and dry  Vital Signs: BP (!) 101/53   Pulse (!) 116   Temp 97.9 F (36.6 C)   Resp 19   Ht _0  (1.626 m)   Wt 67.4 kg   SpO2 100%   BMI 25.51 kg/m  SpO2: SpO2: 100 % O2 Device: O2 Device: Ventilator O2 Flow Rate: O2 Flow Rate (L/min): 4 L/min  Intake/output summary:   Intake/Output Summary (Last  24 hours) at 12/23/2018 2252 Last data filed at 12/24/2018 2200 Gross per 24 hour  Intake 4014.79 ml  Output 3592 ml  Net 422.79 ml   LBM: Last BM Date: 12/12/18 Baseline Weight: Weight: 74.8 kg Most recent weight: Weight: (Unable to weigh due to error on bed.)       Palliative Assessment/Data:    Flowsheet Rows     Most Recent Value  Intake Tab  Referral Department  Surgery  Unit at Time of Referral  ICU  Palliative Care Primary Diagnosis  Sepsis/Infectious Disease  Date Notified  12/01/2018  Palliative Care Type  New Palliative care  Reason for referral  Clarify Goals of Care  Date of Admission  12/06/2018  Date first seen by Palliative Care  12/14/18  # of days Palliative referral response time  1 Day(s)  # of days IP prior to Palliative referral  22  Clinical Assessment  Palliative Performance Scale Score  10%  Psychosocial & Spiritual Assessment  Palliative Care Outcomes  Palliative Care Outcomes  Clarified goals of care      Patient Active Problem List   Diagnosis Date Noted  . Acute respiratory failure with hypoxia and hypercapnia ([place]) 12/12/2018  . Toxic metabolic encephalopathy 11/91/4782  . Thrombocytopenia, acquired ([place]) 12/12/2018  . Small bowel perforation ([place])   . Pressure injury of skin 11/29/2018  . Encounter for central line placement   . Septic shock ([place])   . AKI (acute kidney injury) ([place])   . S/P dialysis catheter insertion ([place])   . 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  Perforated viscus 12/14/2018  . Port-A-Cath in place 08/03/2017  . Port catheter in place 01/31/2016  . Hypersensitivity reaction 02/08/2014  . Drug induced neutropenia(288.03) 01/30/2014  . Nausea with vomiting 11/04/2013  . 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 ATN (acute tubular necrosis) 11/02/2013  . Acute respiratory failure ([place]) 11/02/2013  . Acute respiratory failure with hypoxia ([place]) 10/25/2013  . Abnormal urinalysis 10/25/2013  . Postoperative anemia due to acute blood loss 10/24/2013  . Breast abscess of female 10/22/2013  . HYPERLIPIDEMIA 07/16/2010  . ATELECTASIS 07/16/2010  . Type 2 diabetes mellitus ([place]) 06/24/2010  . 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 HYPOXEMIA 06/24/2010  . SMALL BOWEL OBSTRUCTION, HX OF 06/24/2010    Palliative Care Assessment & Plan   Patient Profile: 63 y.o. female  with past medical history of  admitted on 12/09/2018 with abdominal pain from perforation and complicated clinical course with multiple surgeries requiring resection of large portion of small bowel, code blue, and multisystem organ failure (EF 20%, on CRRT and vent dependent).     Assessment: Patient Active Problem List   Diagnosis Date Noted  . Acute respiratory failure with hypoxia and hypercapnia ([place]) 12/12/2018  . Toxic metabolic encephalopathy 95/62/1308  . Thrombocytopenia, acquired ([place]) 12/12/2018  . Small bowel perforation ([place])   . Pressure injury of skin 11/29/2018  . Encounter for central line placement   . Septic shock ([place])   . AKI (acute kidney injury) ([place])   . S/P dialysis catheter insertion ([place])   . 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 Perforated viscus 11/20/2018  . Port-A-Cath in place 08/03/2017  . Port catheter in place 01/31/2016  . Hypersensitivity reaction 02/08/2014  . Drug induced neutropenia(288.03) 01/30/2014  . Nausea with vomiting 11/04/2013  . 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 ATN (acute tubular necrosis) 11/02/2013  . Acute respiratory  failure ([place]) 11/02/2013  . Acute respiratory failure with hypoxia ([place]) 10/25/2013  . Abnormal urinalysis 10/25/2013  . Postoperative anemia due to acute blood loss 10/24/2013  . Breast abscess of female 10/22/2013  . HYPERLIPIDEMIA 07/16/2010  . ATELECTASIS 07/16/2010  . Type 2 diabetes mellitus ([place]) 06/24/2010  . 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 HYPOXEMIA 06/24/2010  . SMALL BOWEL OBSTRUCTION, HX OF 06/24/2010    Recommendations/Plan: Discussed again with patient's brother/HCPOA.  He reports understanding she has a high risk of developing another leak but remains hopeful.  We reviewed again briefly about trach and he reports wanting to see how she does after this most recent surgery until addressing trach.  Code Status:    Code Status Orders  (From admission, onward)         Start     Ordered   12/12/2018 1515  Full code  Continuous     11/30/2018 1515        Code Status History    Date Active Date Inactive Code Status Order ID Comments User Context   12/14/2018 1512 12/14/2018 1515 Full Code [id number]  Chesley Mires, MD Inpatient   Advance Care Planning Activity       Prognosis:   Poor  Discharge Planning:  To Be Determined  Care plan was discussed with brother, bedside RN  Thank you for allowing the Palliative Medicine Team to assist in the care of this patient.   Time In: 1900 Time Out: 1915 Total Time 15 Prolonged Time Billed No      Greater than 50%  of this time was spent counseling and coordinating care related to the above assessment and plan.  Micheline Rough, MD  Please contact Palliative Medicine Team phone at [phone] for questions and concerns.

## 2018-12-20 NOTE — Anesthesia Preprocedure Evaluation (Signed)
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%) and + DVT (IJ)   Rhythm:Regular Rate:Tachycardia     Neuro/Psych negative neurological ROS  negative psych ROS   GI/Hepatic Neg liver ROS, Perforated bowel w/ open abdomen s/p multiple surgeries   Endo/Other  diabetes, Type 2  Renal/GU ARF and DialysisRenal disease  negative genitourinary   Musculoskeletal negative musculoskeletal ROS (+)   Abdominal   Peds  Hematology  (+) anemia ,   Anesthesia Other Findings   Reproductive/Obstetrics                             Anesthesia Physical  Anesthesia Plan  ASA: IV  Anesthesia Plan: General   Post-op Pain Management:    Induction: Inhalational and Intravenous  PONV Risk Score and Plan: 3 and Ondansetron, Dexamethasone, Midazolam and Treatment may vary due to age or medical condition  Airway Management Planned: Oral ETT  Additional Equipment: None  Intra-op Plan:   Post-operative Plan: Post-operative intubation/ventilation  Informed Consent: I have reviewed the patients History and Physical, chart, labs and discussed the procedure including the risks, benefits and alternatives for the proposed anesthesia with the patient or authorized representative who has indicated his/her understanding and acceptance.       Plan Discussed with: CRNA, Anesthesiologist and Surgeon  Anesthesia Plan Comments:          Anesthesia Quick Evaluation

## 2018-12-20 NOTE — Progress Notes (Addendum)
PHARMACY - ADULT TOTAL PARENTERAL NUTRITION CONSULT NOTE   Pharmacy Consult for TPN Indication: prolonged ileus  HPI: 89 yoF admitted 6/7 with perforated small bowel from closed-loop obstruction and underwent emergent exploratory laparotomy. Returned to OR on 6/9 and again on 6/14 for closure of abdomen.  Patient coded on 2022/12/14 and developed multiorgan failure. Remains intubated & on CRRT. Pressors have been weaned off and she is completing antibiotic course for sepsis/PNA.  NPO since admission on 6/7. Pharmacy consulted to start TPN 6/16. D/t acute liver injury from shock, initiated TPN at low rate & advance slowly as requested by surgery.  Patient Measurements: Height: '5\' 4"'  (162.6 cm) Weight: (Unable to weigh due to error on bed.) IBW/kg (Calculated) : 54.7 TPN AdjBW (KG): 59.7 Body mass index is 25.51 kg/m. Usual Weight: 90kg   Intake/Output Summary (Last 24 hours) at 12/23/2018 1047 Last data filed at 12/22/2018 1036 Gross per 24 hour  Intake 3968.11 ml  Output 3127 ml  Net 841.11 ml    Recent Labs    12/18/2018 0500 12/25/2018 1600 01/03/2019 0345  NA 135 138 140  139  K 4.3 4.0 3.6  3.6  CL 105 102 92*  91*  CO2 24 28 36*  32  GLUCOSE 147* 166* 145*  146*  BUN 45* 43* 41*  41*  CREATININE 0.94 0.92 0.96  1.00  CALCIUM 7.1* 8.0* 9.5  9.4  PHOS 2.4* 2.5 2.4*  2.4*  MG 2.2  --  1.9  ALBUMIN 1.3* 1.3* 1.3*  1.4*  ALKPHOS  --   --  139*  AST  --   --  25  ALT  --   --  19  BILITOT  --   --  0.4  TRIG  --   --  142  PREALBUMIN  --   --  9.3*    Significant events:  6/18: OK to increase to goal rate per surgery 6/22: CRRT stopped for holiday; CBG 69 overnight, D50 given but TPN continued (55 units insulin in TPN); BS and BM overnight - initiating trickle feeds 6/23: resuming CRRT, return to high-protein TPN; fevers overnight, CCM removing some central lines; will use chemo port for TPN 6/24: began adding lipids to TPN formula 6/25: trickle feeds stopped d/t  abd distention 6/26: CRRT clotted off overnight; patient to OR this AM for ex lap; RN attempting to restart CRRT post-op but still having issues at this time 6/27: CRRT appears to have been successfully resumed overnight 6/29 OR pus throughout abdomen. Small hole in small bowel 7/3 OR wash out of abd, replace wound VAC, abd remains open 7/6 sched for OR today for wash out abd, replace wound VAC  Central access: 11/16/2018 TPN start date: 11/30/18  ASSESSMENT                                                                                                          Current Nutrition: NPO IVF: On CRRT to keep net neutral I/O  Today:   Glucose (goal 100-150) - Hx DM on 70/30 Novolog  20 units BID PTA.  CBGs lower without lipids in TPN (range 96-172)  25 units insulin in TPN + ICU resistant scale; 12 units SSI required yesterday  Electrolytes - on CRRT; K, Cl, Phos borderline low, Ca elevated, Na WNL  Renal - on CRRT, SCr, BUN following; bicarb elevated  LFTs - albumin remains low; alk phos elevated but improved to near normal (7/6)  TGs - WNL (7/6)  Prealbumin - remains low but improving  NUTRITIONAL GOALS                                                                                             RD recs: (6/29):  Kcal:1514 kcal, Protein:195-215 grams Fluid:>/= 1.8 L/day  PLAN                                                                                                                         At 1800 today:  Adjust TPN to 85 ml/hr to accommodate for less concentrated lipids (macros held at same amount)  30% lipid still on backorder, should be available tomorrow  Custom TPN with lipids at goal rate of 75 ml/hr provides: 198 g/day protein, 1512 Kcal/day = 100% support  Electrolytes: continue to hold Mag, increase K, Phos, remove Ca; Cl:Ac = max Cl; patient is on Ca Gluconate drip while on anticoagulant citrate for CRRT - question if this could be adjusted if Ca remains  persistently elevated  TPN to contain standard multivitamins, trace elements MWF only due to national backorder.  IVF per MD  Continue ICU resistant-scale glycemic control orders q6h; continue 25 units regular insulin in TPN   TPN lab panels Mondays & Thursdays  Ordered BID renal function panels while on CRRT  Reuel Boom, PharmD, BCPS [phone] 01/03/2019, 10:48 AM

## 2018-12-20 NOTE — Transfer of Care (Signed)
Immediate Anesthesia Transfer of Care Note  Patient: Carmen Stone  Procedure(s) Performed: EXPLORATORY LAPAROTOMY WITH WOUND WASHOUT AND SMALL BOWEL ANASTOMOSIS  Patient Location: ICU  Anesthesia Type:General  Level of Consciousness: unresponsive and Patient remains intubated per anesthesia plan  Airway & Oxygen Therapy: Patient remains intubated per anesthesia plan  Post-op Assessment: Report given to RN and Post -op Vital signs reviewed and stable  Post vital signs: Reviewed and stable  Last Vitals:  Vitals Value Taken Time  BP    Temp 36.4 C 12/30/2018 1103  Pulse 108 12/26/2018 1103  Resp 18 01/03/2019 1103  SpO2 100 % 12/24/2018 1103  Vitals shown include unvalidated device data.  Last Pain:  Vitals:   12/28/2018 0800  TempSrc: Bladder  PainSc:          Complications: No apparent anesthesia complications

## 2018-12-20 NOTE — Progress Notes (Signed)
3 Days Post-Op   Subjective/Chief Complaint: On CRRT; opens eyes to voice. Shakes head no when asked if anything is bother her.  Objective: Vital signs in last 24 hours: Temp:  [97 F (36.1 C)-99.7 F (37.6 C)] 97.9 F (36.6 C) (07/06 0700) Pulse Rate:  [79-125] 87 (07/06 0630) Resp:  [13-30] 17 (07/06 0700) BP: (93-174)/(25-88) 146/56 (07/06 0700) SpO2:  [96 %-100 %] 100 % (07/06 0744) FiO2 (%):  [30 %] 30 % (07/06 0744) Last BM Date: 01/11/19  Intake/Output from previous day: 07/05 0701 - 07/06 0700 In: 4228.6 [I.V.:2958.2; Blood:1007.5; IV Piggyback:257.8] Out: 2986 [Urine:106; Emesis/NG output:665; Drains:1070] Intake/Output this shift: Total I/O In: 178.9 [I.V.:153; IV Piggyback:25.9] Out: 155 [Urine:30; Emesis/NG output:50; Drains:25; Other:50]  Exam: On Vent Abdomen soft, Non-distended, VAC in place and maintaining suction. Blood tinged output  NG clear  No blood per rectum  Lab Results:  Recent Labs    01/06/2019 1700 01/11/2019 2009 12/21/2018 0345  WBC 19.1*  --  21.9*  HGB 9.3* 9.6* 9.2*  HCT 28.5* 29.3* 28.2*  PLT 170  --  206   BMET Recent Labs    01/12/2019 1600 12/30/2018 0345  NA 138 140  139  K 4.0 3.6  3.6  CL 102 92*  91*  CO2 28 36*  32  GLUCOSE 166* 145*  146*  BUN 43* 41*  41*  CREATININE 0.92 0.96  1.00  CALCIUM 8.0* 9.5  9.4   PT/INR Recent Labs    12/23/2018 0628  LABPROT 14.9  INR 1.2   ABG No results for input(s): PHART, HCO3 in the last 72 hours.  Invalid input(s): PCO2, PO2  Studies/Results: Dg Abd 1 View  Result Date: 12/28/2018 CLINICAL DATA:  Adjustment of nasogastric tube. EXAM: ABDOMEN - 1 VIEW COMPARISON:  Earlier today at 3:04 p.m. FINDINGS: 6:59 p.m. The nasogastric tube has been advanced and terminates at the gastric antrum with the side port at the level of the gastric body. Mild right hemidiaphragm elevation. Non-obstructive bowel gas pattern. Contrast within normal caliber colon. No free intraperitoneal  air. Probable cholecystectomy clips. IMPRESSION: Nasogastric tube terminates at the gastric antrum. Electronically Signed   By: Abigail Miyamoto M.D.   On: 12/30/2018 20:06   Dg Chest Port 1 View  Result Date: 12/27/2018 CLINICAL DATA:  Respiratory failure. EXAM: PORTABLE CHEST 1 VIEW COMPARISON:  12/18/2018 and 12/25/2018 FINDINGS: Endotracheal tube tip is 4.7 cm above the carina. NG tube tip is below the diaphragm. Port-A-Cath and 2 central lines all appear in good position, unchanged in the superior vena cava. Heart size and vascularity are normal. Minimal atelectasis at the right base laterally. IMPRESSION: Minimal atelectasis at the right lung base.  No significant change. Electronically Signed   By: Lorriane Shire M.D.   On: 01/14/2019 05:23   Dg Abd Portable 1v  Result Date: 12/28/2018 CLINICAL DATA:  63 year old female with vomiting despite NG tube placement. EXAM: PORTABLE ABDOMEN - 1 VIEW COMPARISON:  CT Abdomen and Pelvis 12/09/2018. FINDINGS: Portable AP view at 1504 hours. NG tube tip is at the level of the gastric body. Side hole is at the level of the gastric cardia. Nonobstructed visible bowel gas pattern. There is some retained oral contrast at the non dilated hepatic flexure. Stable cholecystectomy clips. Partially visible central lines in the right mediastinum. Partially visible left chest port, accessed. Stable lung bases. No acute osseous abnormality identified. Right axillary surgical clips. IMPRESSION: Enteric tube side hole at the level of the gastric cardia and  nonobstructed visible bowel gas pattern. Electronically Signed   By: Genevie Ann M.D.   On: 01/11/2019 15:39    Anti-infectives: Anti-infectives (From admission, onward)   Start     Dose/Rate Route Frequency Ordered Stop   12/08/2018 1030  clindamycin (CLEOCIN) 900 mg, gentamicin (GARAMYCIN) 240 mg in sodium chloride 0.9 % 1,000 mL for intraperitoneal lavage      Irrigation To Surgery 12/03/2018 1026 11/24/2018 1042   12/11/18 1500   anidulafungin (ERAXIS) 100 mg in sodium chloride 0.9 % 100 mL IVPB     100 mg 78 mL/hr over 100 Minutes Intravenous Every 24 hours 11/15/2018 1007     12/11/18 0600  piperacillin-tazobactam (ZOSYN) IVPB 3.375 g     3.375 g 100 mL/hr over 30 Minutes Intravenous Every 6 hours 12/11/18 0317     11/16/2018 1800  piperacillin-tazobactam (ZOSYN) IVPB 2.25 g  Status:  Discontinued     2.25 g 100 mL/hr over 30 Minutes Intravenous Every 6 hours 11/20/2018 1413 12/11/18 0317   12/08/2018 1100  anidulafungin (ERAXIS) 200 mg in sodium chloride 0.9 % 200 mL IVPB     200 mg 78 mL/hr over 200 Minutes Intravenous  Once 12/08/2018 1007 12/12/2018 1456   11/26/2018 1100  piperacillin-tazobactam (ZOSYN) IVPB 3.375 g     3.375 g 100 mL/hr over 30 Minutes Intravenous  Once 12/09/2018 1027 11/15/2018 1245   12/08/18 1800  vancomycin (VANCOCIN) IVPB 750 mg/150 ml premix  Status:  Discontinued     750 mg 150 mL/hr over 60 Minutes Intravenous Every 48 hours 12/07/18 0822 12/07/18 0903   12/08/18 1000  ceFEPIme (MAXIPIME) 2 g in sodium chloride 0.9 % 100 mL IVPB  Status:  Discontinued     2 g 200 mL/hr over 30 Minutes Intravenous Every 24 hours 12/07/18 0822 12/07/18 0903   12/07/18 2200  ceFEPIme (MAXIPIME) 2 g in sodium chloride 0.9 % 100 mL IVPB  Status:  Discontinued     2 g 200 mL/hr over 30 Minutes Intravenous Every 12 hours 12/07/18 0903 11/29/2018 1027   12/07/18 1800  vancomycin (VANCOCIN) IVPB 750 mg/150 ml premix  Status:  Discontinued     750 mg 150 mL/hr over 60 Minutes Intravenous Every 24 hours 12/07/18 0903 12/09/18 1040   12/06/18 1800  vancomycin (VANCOCIN) IVPB 750 mg/150 ml premix  Status:  Discontinued     750 mg 150 mL/hr over 60 Minutes Intravenous Every 24 hours 12/05/18 1652 12/07/18 0822   12/05/18 1800  ceFEPIme (MAXIPIME) 2 g in sodium chloride 0.9 % 100 mL IVPB  Status:  Discontinued     2 g 200 mL/hr over 30 Minutes Intravenous Every 12 hours 12/05/18 1644 12/07/18 0822   12/05/18 1700  vancomycin  (VANCOCIN) 1,250 mg in sodium chloride 0.9 % 250 mL IVPB     1,250 mg 166.7 mL/hr over 90 Minutes Intravenous  Once 12/05/18 1652 12/05/18 2019   12/07/2018 1800  fluconazole (DIFLUCAN) IVPB 200 mg  Status:  Discontinued     200 mg 100 mL/hr over 60 Minutes Intravenous Every 24 hours 11/29/2018 0748 11/20/2018 0847   11/18/2018 1800  fluconazole (DIFLUCAN) IVPB 400 mg  Status:  Discontinued     400 mg 100 mL/hr over 120 Minutes Intravenous Every 24 hours 11/22/2018 0847 12/14/2018 0856   11/26/2018 1400  piperacillin-tazobactam (ZOSYN) IVPB 2.25 g  Status:  Discontinued     2.25 g 100 mL/hr over 30 Minutes Intravenous Every 6 hours 12/03/2018 0748 12/01/18 1104   11/19/2018 0600  cefoTEtan (CEFOTAN) 2 g in sodium chloride 0.9 % 100 mL IVPB     2 g 200 mL/hr over 30 Minutes Intravenous On call to O.R. 11/22/18 1300 12/02/2018 0624   11/22/18 1800  fluconazole (DIFLUCAN) IVPB 400 mg  Status:  Discontinued     400 mg 100 mL/hr over 120 Minutes Intravenous Every 24 hours 12/03/2018 1528 11/26/2018 0748   11/22/18 1800  vancomycin (VANCOCIN) 1,500 mg in sodium chloride 0.9 % 500 mL IVPB  Status:  Discontinued     1,500 mg 250 mL/hr over 120 Minutes Intravenous Every 24 hours 11/22/18 0358 11/22/18 0750   11/22/18 0115  vancomycin (VANCOCIN) 1,500 mg in sodium chloride 0.9 % 500 mL IVPB     1,500 mg 250 mL/hr over 120 Minutes Intravenous  Once 11/22/18 0101 11/22/18 0319   11/16/2018 1630  fluconazole (DIFLUCAN) IVPB 800 mg     800 mg 100 mL/hr over 240 Minutes Intravenous  Once 11/26/2018 1527 12/05/2018 2013   12/01/2018 1600  piperacillin-tazobactam (ZOSYN) IVPB 3.375 g  Status:  Discontinued     3.375 g 12.5 mL/hr over 240 Minutes Intravenous Every 8 hours 11/30/2018 1522 11/24/2018 0748   12/11/2018 0545  piperacillin-tazobactam (ZOSYN) IVPB 3.375 g     3.375 g 100 mL/hr over 30 Minutes Intravenous  Once 12/01/2018 0530 11/18/2018 0626   12/06/2018 0530  piperacillin-tazobactam (ZOSYN) IVPB 4.5 g  Status:  Discontinued     4.5  g 200 mL/hr over 30 Minutes Intravenous  Once 11/24/2018 0517 11/26/2018 0529      Assessment/Plan: s/p Procedure(s): [place] OUT OF ABDOMEN (N/A) APPLICATION OF WOUND VAC (N/A)   -Remains critically ill.   -Albumin 1.3; abdomen has been open now for 10 days; critically ill on TPN and CRRT. I had a long discussion over the phone this morning with her brother, Iona Beard. We discussed her case at length. We discussed her critically ill status, significant nutrition deficits, recent anastomotic leak and small amount of bowel remaining. We discussed that anastomotic complications remain high with any further surgery and potential for mortality in her particular case is very high. We discussed that based on prior findings, bringing up a proximal ostomy may not be possible based on mesenteric length and her habitus. We discussed palliative options as well with goals being to keep her comfortable. We discussed further surgery is highly likely to end in similar manner as previous - including bowel leak. -The anatomy and physiology of the GI tract was discussed at length with her family. The pathophysiology of her condition was discussed at length as well -We discussed palliative options moving forward vs takeback with washout, possible bowel anastomosis, possible ostomy all based on intraoperative options available with findings. -The procedure, material risks (including, but not limited to, pain, bleeding, infection, scarring, need for blood transfusion, damage to surrounding structures- blood vessels/nerves/viscus/organs, damage to ureter, leak from anastomosis - high likelihood, inability for ostomy to reach, need for stoma which may be permanent, worsening of pre-existing medical conditions, hernia, recurrence, pneumonia, heart attack, stroke, death) benefits and alternatives to surgery were discussed at length. George's (and the patient's) questions were answered to their satisfaction, they expressed understanding  and after considering all options have elected to proceed with takeback to OR today with attempt at bowel anastomosis. Their questions were answered to their satisfaction, they voiced understanding and elected to proceed with surgery. Additionally, we discussed high probability for anastomotic complications should this be done as noted above. We discussed that following this  surgery, should she experience any major abdominal issues including bowel leaks that additional surgery is unlikely to change her overall prognosis/outcome and proceeding with comfort measures would likely be the most humane options available; they have expressed understanding and agreement with this plan   LOS: 29 days   Carmen Stone 01/04/2019

## 2018-12-20 NOTE — Progress Notes (Addendum)
[facility] ROUNDING NOTE   Subjective:   This is a 63 year old lady with acute kidney injury last baseline 0.79 06/04/2018.  She had acute oliguric acute tubular necrosis in the setting of septic shock from bowel perforation.  Was on CRRT from 6/ 9  to  6/12 was held after cardiac arrest.  She resumed 12/05/2018 after abdominal closure.  Still no signs of renal recovery.  Status post small bowel perforation due to closed-loop SBO and septic shock followed by CCM underwent closure 12/04/2018 new recent bowel leak due to nonhealing anastomosis.  Went back to the OR 12/11/2018 for resection of anastomosis.  She went back to the OR 11/26/2018.  She has a large wound VAC.  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  No history of diabetes mellitus type 2.  Urine output 100 cc yesterday 12/18/2018.  Has been kept even on CRRT  Blood pressure 102/41 pulse 117 temperature 96.8 O2 sats 100% FiO2 30%  IV heparin IV TPN IV Zosyn 3.375 g IV hydralazine and metoprolol prn ^BP Protonix 40 mg IV every 24 hours  Chest x-ray revealed clear lungs except for mild atelectasis right base   Objective:  Vital signs in last 24 hours:  Temp:  [95.9 F (35.5 C)-99 F (37.2 C)] 97.6 F (36.4 C) (07/06 1349) Pulse Rate:  [79-120] 101 (07/06 1400) Resp:  [13-27] 18 (07/06 1400) BP: (109-174)/(25-88) 148/82 (07/06 1400) SpO2:  [98 %-100 %] 100 % (07/06 1400) FiO2 (%):  [30 %] 30 % (07/06 1200)  Weight change:  Filed Weights   12/16/18 0500 01/03/2019 0500  Weight: 67.1 kg 67.4 kg    Intake/Output: I/O last 3 completed shifts: In: 6512.1 [I.V.:4041; Blood:1667.5; Other:5; IV Piggyback:798.6] Out: 2440 [Urine:109; Emesis/NG output:715; Drains:1370; NUUVO:5366]   Intake/Output this shift:  Total I/O In: 1258.1 [I.V.:1182.3; IV Piggyback:75.9] Out: 622 [Urine:120; Emesis/NG output:110; Drains:100; [place]; Blood:25]  Exam: Intubated and sedated CVS- RRR no  JVP RS- CTA intubated with rhonchorous breath sounds ABD- BS present soft non-distended EXT-1-2+ lower extremity edema   Assessment/ Plan:   Acute kidney injury with very little signs of recovery.  She is anuric she is requiring CRRT since 11/15/2018.  Baseline serum creatinine 0.9.  Acute kidney injury following perforated small bowel with fecal peritonitis and wound debridement. Getting citrate AC w/ CRRT and serum Ca and HCO3 are rising, will change bath to low Ca/ low HCO3 version.  Hypotension/volume: no vol excess on exam, 8 kg under admit wt, 23kg down from peak wt. Getting TNA w/ volume load. Continue CRRT and keep even  Acid-base balance stable at this time  Anemia with acute drop in hemoglobin defer to primary team  Perforated small bowel with acute kidney injury 6/9 to 11/26/2018.  New recent bowel leak due to nonhealing of anastomosis went to the OR 12/12/2018 12/14/2018 appreciate assistance from Dr. Barry Dienes.  Wound VAC  Diabetes mellitus per primary team  Nutrition continues on TNA  Hypophosphatemia resolved added with TNA  Antimicrobials continues on Zosyn.  It appears that Eraxis was discontinued 12/18/2018    Kelly Splinter, MD 12/23/2018, 2:08 PM     Basic Metabolic Panel: Recent Labs  Lab 12/16/18 0600  01/01/2019 0419  12/18/18 0500 12/18/18 1612 01/08/2019 0500 12/22/2018 1600 12/27/2018 0345  NA 137   < > 137   < > 136 136 135 138 140  139  K 4.1   < > 4.2   < > 4.3 4.2 4.3 4.0  3.6  3.6  CL 104   < > 104   < > 103 105 105 102 92*  91*  CO2 26   < > 27   < > _0 36*  32  GLUCOSE 114*   < > 118*   < > 97 116* 147* 166* 145*  146*  BUN 45*   < > 48*   < > 45* 42* 45* 43* 41*  41*  CREATININE 0.99   < > 1.00   < > 1.01* 0.88 0.94 0.92 0.96  1.00  CALCIUM 7.6*   < > 7.5*   < > 7.5* 7.5* 7.1* 8.0* 9.5  9.4  MG 2.2  --  2.2  --  2.2  --  2.2  --  1.9  PHOS 2.2*   < > 2.1*   < > 2.4* 2.3* 2.4* 2.5 2.4*  2.4*   < > = values in this interval not displayed.     Liver Function Tests: Recent Labs  Lab 12/16/18 0600  12/18/18 0500 12/18/18 1612 12/24/2018 0500 01/03/2019 1600 01/06/2019 0345  AST 24  --   --   --   --   --  25  ALT 22  --   --   --   --   --  19  ALKPHOS 225*  --   --   --   --   --  139*  BILITOT 0.4  --   --   --   --   --  0.4  PROT 6.0*  --   --   --   --   --  4.9*  ALBUMIN 1.4*   < > 1.4* 1.4* 1.3* 1.3* 1.3*  1.4*   < > = values in this interval not displayed.   No results for input(s): LIPASE, AMYLASE in the last 168 hours. No results for input(s): AMMONIA in the last 168 hours.  CBC: Recent Labs  Lab 12/18/18 0500 12/18/18 1916 01/06/2019 0500 12/22/2018 1700 12/28/2018 2009 12/22/2018 0345  WBC 23.4* 24.6* 33.0* 19.1*  --  21.9*  NEUTROABS  --   --   --   --   --  16.9*  HGB 7.0* 6.8* 6.0* 9.3* 9.6* 9.2*  HCT 22.4* 21.3* 18.4* 28.5* 29.3* 28.2*  MCV 96.6 97.3 95.3 91.6  --  90.7  PLT 194 243 177 170  --  206     Medications:   .  prismasol BGK 4/2.5 500 mL/hr at 12/16/2018 1123  . sodium chloride 7.5 mL/hr at 12/31/2018 1400  . calcium gluconate infusion for CRRT 70 mL/hr at 12/31/2018 1150  . fentaNYL infusion INTRAVENOUS 50 mcg/hr (01/11/2019 1400)  . piperacillin-tazobactam (ZOSYN)  IV Stopped (01/05/2019 1256)  . prismasol BGK 4/2.5 2,000 mL/hr at 12/18/2018 1300  . sodium citrate 2 %/dextrose 2.5% solution 3000 mL 250 mL/hr at 01/14/2019 1123  . TPN ADULT (ION) 75 mL/hr at 12/21/2018 1400  . TPN ADULT (ION)     . chlorhexidine gluconate (MEDLINE KIT)  15 mL Mouth Rinse BID  . Chlorhexidine Gluconate Cloth  6 each Topical Daily  . dorzolamide-timolol  1 drop Both Eyes BID  . insulin aspart  3-9 Units Subcutaneous Q6H  . levothyroxine  12.5 mcg Intravenous Daily  . mouth rinse  15 mL Mouth Rinse 10 times per day  . pantoprazole (PROTONIX) IV  40 mg Intravenous Q24H   sodium chloride, albuterol, alteplase, fentaNYL (SUBLIMAZE) injection, heparin, hydrALAZINE, metoprolol tartrate, sodium chloride,  sodium chloride  flush

## 2018-12-20 NOTE — Progress Notes (Signed)
Nutrition Follow-up  DOCUMENTATION CODES:   Not applicable  INTERVENTION:  - continue TPN per Pharmacy. - recommend replacement of NGT as tube has been in since 6/7 and presents increased risk of infection.   NUTRITION DIAGNOSIS:   Inadequate oral intake related to inability to eat as evidenced by NPO status. -ongoing  GOAL:   Patient will meet greater than or equal to 90% of their needs -met with TPN regimen  MONITOR:   Vent status, Labs, Weight trends, Skin, I & O's, Other (Comment)(TPN regimen)  ASSESSMENT:   63 year old female with past medical history of type 2 DM, hyperlipidemia, HTN, inflammatory R breast cancer s/p radiation in 2015. Patient presented to the ED on 6/7 with severe abdominal pain, N/V. CT abdomen/pelvis which showed perforated small bowel with large amount of free air and multiple incarcerated incisional hernias.   Significant Events: 6/7- admission; ex lap, small bowel resection, LOAs, and placement of wound vac; NGT placement 6/9- CRRT initiation; return to OR for re-opening of open abdomen with washout 6/12- CRRT stopped 6/14- bowel anastomosis and closure of abdominal wall; CRRT re-started 6/16- initiation of TPN 6/19- extubation 6/20- re-intubation for AMS 6/21- CRRT stopped for break 6/22- initiation of trickle TF 6/23- CRRT re-started 6/24- begin slowly advancing TF to goal rate 6/25- TF stopped and NGT to LIS d/t abdominal distention 6/26- CT abdomen/pelvis (+) for free air; return to OR for ex lap, resection of previous anastomosis, application of wound vac 6/29- return to OR (possible abd closure today) 6/30- Palliative Care consulted 7/3- return to OR for ex lap, wash out of abdomen, placement of wound vac (open abd) 7/6- return to OR for washout, possible bowel anastomosis, and possible ostomy   Patient remains intubated. NGT to LIS. This NGT has been in place since 6/7--highly recommend replacement as tube has now been in for 1 month  and presents increased risk for infection. Patient continues with custom TPN at goal rate of 75 ml/hr which is providing 1512 kcal, 198 grams protein to meet 100% estimated nutrition needs. Noted last BM was on 6/28 (8 days ago). She continues on CRRT. Palliative continues to follow and work with patient and family. Patient remains full code at this time.    Patient is currently intubated on ventilator support MV: 8.4 L/min Temp (24hrs), Avg:98.5 F (36.9 C), Min:97.3 F (36.3 C), Max:99.7 F (37.6 C) Propofol: none    Medications reviewed; sliding scale novolog, 12.5 mcg IV synthroid/day. Labs reviewed; CBGs: 153 and 96 mg/dl today, Cl: 91 mmol/l, BUN: 41 mg/dl, Phos: 2.4 mg/dl, Alk Phos elevated, triglycerides: 142 mg/dl today. Drip; fentanyl @ 50 mcg/hr.    NUTRITION - FOCUSED PHYSICAL EXAM:  last done 6/8; will re-assess at next follow-up  Diet Order:   Diet Order    None      EDUCATION NEEDS:   No education needs have been identified at this time  Skin:  Skin Assessment: Skin Integrity Issues: Skin Integrity Issues:: DTI, Stage II, Incisions DTI: sacrum Stage II: L ear Wound Vac: abdomen Incisions: abdomen (most recent: 7/3)  Last BM:  6/28  Height:   Ht Readings from Last 1 Encounters:  12/09/18 '5\' 4"'  (1.626 m)    Weight:   Wt Readings from Last 1 Encounters:  06/02/18 76.4 kg    Ideal Body Weight:  54.5 kg  BMI:  Body mass index is 25.51 kg/m.  Estimated Nutritional Needs:   Kcal:  1493 kcal  Protein:  195-215 grams  Fluid:  >/=  1.8 L/day     Jarome Matin, MS, RD, LDN, [facility] Inpatient Clinical Dietitian Pager # [phone] After hours/weekend pager # [phone]

## 2018-12-20 NOTE — Progress Notes (Signed)
NAME:  Carmen Stone, MRN:  [id number], DOB:  Jun 05, 1956, LOS: 45 ADMISSION DATE:  11/22/2018, CONSULTATION DATE:  11/21/2018 REFERRING MD:  Dr. Ninfa Linden, Surgery, CHIEF COMPLAINT:  Abdominal pain   Brief History   63 y/o female presented to ER 12/02/2018 with nausea, vomiting, abdominal pain.  CT abd/pelvis showed focal perforated loop of SB with free air with multiple complex ventral hernias.  Underwent emergent laparotomy with SB resection, lysis of adhesions and wound vac.  Remained on vent and pressors post op.  Hospital course complicated by respiratory arrest 6/13, multiple reintubations, intermittent vasopressor needs, AKI on CVVHD.     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 Changing volume goal on CRRT to -100 cc an hour. Weaned 7 hours PSV   6/16 Encephalopathic. Hypertensive, adding labetalol.  IV TNA started 6/17 A little more awake.  Tolerating PSV but SBI exceeds 105.  Volume removal with CVVHD 6/18 Neuro status improved.  Required brief vasoactive drips.  Net negative fluid volume status.PSV 6/19 Extubated 6/20 reintubated for altered mentation, head CT negative  6/21 Tx PRBC x1.  Off vasopressors 6/23 Febrile overnight, Blood Cx. Transfuse 1 U PRBC 6/24 Afebrile. Remains off pressors. 100cc/hr removal /c CRRT. 30% FiO2.  6/25 L IJ, subclavian, axillary  + DVT. Heparin initiated. Remains on CRRT.  6/26  CT ABD/pelvis positive for free air and ascites. Back to OR  for ex lap of perforated viscous, resection of previous anastomosis, application of wound vac.  1U RBCs intra op.   6/28 Back on pressors with hgb <7 / sedated on fent drip, PRBC 6/29 Pending OR for possible closure  7/01 Less sedation, following commands, abd open with VAC, on CVVHD (even) 7/03 OR, unable to close but bowel "looks some better", hematoma in abd, on CVVHD 7/05 hemoglobin drop with red drainage from wound vac, heparin gtt held, transfuse PRBC  Consults:  Renal 6/09 AKI, acidosis Cardiology 6/09 Takotsubo CM  Palliative care 6/30 Goals of care  Procedures:  ETT 6/07 >> 6/19, 6/20 >> Rt IJ HD 6/09 >> Rt [place] CVL 6/09 >> Lt radial aline 6/09 >> discontinued  Significant Diagnostic Tests:  CT abd/pelvis 6/07 >> focal perforated loop of SB with free air with multiple complex ventral hernias Echo 6/08 >> EF 20%, Takotsubo CM CT head 6/20 >> negative for any acute findings CT abdomen 6/20 >> bilateral effusions, atelectasis left lower lobe, results noted.    CT Chest 6/20 >> small bilateral pleural effusions with compressive atelectasis, anterior R 4-7 rib fractures CT ABD/Pelvis 6/20 >> small bowel surgical changes, mild circumferential wall thickening of scattered small bowel loops, small to moderate ascites LUE Duplex 6/24 >> DVT L IJ, subclavian, axillary, SVT L cephalic  CT ABD/Pelvis 1/93 >> large volume ascites with intraperitoneal free air   Micro Data:  COVID 6/07 >> negative Tracheal aspirate 6/20 >> E Coli sensitive to zosyn BCx2 6/23 >> negative BCx2  6/26 >> negative   Antimicrobials:  Zosyn 6/07 >> 6/17 Diflucan 6/07 >> 6/10 Cefepime 6/21 >>6/26 Vanco 6/21 >> 6/25 Anidulafungin 6/26 >> 7/06  Zosyn 6/26 >>  Interim history/subjective:  Remains on vent support, sedation, CRRT.  475 ml from wound vac.  Objective   Blood pressure (!) 146/56, pulse 87, temperature 97.9 F (36.6 C), resp. rate 17, height 5\' 4"  (1.626 m), weight 67.4 kg, SpO2 100 %.    Vent Mode: PRVC FiO2 (%):  [30 %] 30 % Set Rate:  [18 bmp] 18 bmp Vt Set:  [430 mL] 430 mL  PEEP:  [5 cmH20] 5 cmH20 Plateau Pressure:  [12 cmH20-24 cmH20] 18 cmH20   Intake/Output Summary (Last 24 hours) at 12/28/2018 6270 Last data filed at 01/10/2019 0700 Gross per 24 hour  Intake 4001.16 ml  Output 2927 ml  Net 1074.16 ml   Filed Weights   12/16/18 0500 12/29/2018 0500  Weight: 67.1 kg 67.4 kg    Examination:  General - sedated Eyes - pupils reactive ENT - ETT in place Cardiac - regular rate/rhythm, no murmur Chest - equal breath sounds b/l, no wheezing or rales Abdomen - soft, non tender, wound vac in place Extremities - no cyanosis, clubbing, or edema Skin - no rashes Neuro - RASS -2  CXR (reviewed by me) - basilar ATX   Resolved Hospital Problem list   Metabolic alkalosis, Abdominal peritonitis with septic shock, Circulatory shock, Cardiorespiratory arrest 6/13, Shock liver, Hypophosphatemia, thrombocytopenia  Assessment & Plan:   Acute hypoxic respiratory failure in setting of septic shock with peritonitis. Failure to wean - failed extubation attempt 3 times. Plan - full vent support - will need trach if family wishes to continue aggressive care >> can be done at her next OR trip by surgery, or if no further OR trips planned then can arrange for percutaneous tracheostomy at bedside - f/u CXR intermittently  Small Bowel Perforation s/p Resection, Lysis of Adhesions. -reexploration 6/14, creation of functional end to end small bowel anastomosis and closure of abdominal wound -reexploration 6/26 with pervious anastomosis broken down with holes in same s/p resection of anastomosis with placement of wound vac -6/29 OR findings of pus in the abd, bilious inflammatory rind on intestines & additional perforation -7/03 OR with hematoma, improvement in bowel appearance, not closed  Plan - post op care, nutrition per CCS  Acute Metabolic Encephalopathy. Plan - RASS goal 0 to -1  Fever. - could be related to abdominal hematoma - E coli also in sputum from 6/21  Plan - day 10/14 of Abx - will d/c anidulafungin  Anemia from blood loss with intraabdominal hematoma. 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 Plan - monitor hemodynamics - hold outpt lisinopril, pravachol - f/u Echo when more stable - prn hydralazine for SBP > 160  Acute kidney injury from tubular necrosis. -baseline creatinine of 0.73 from 11/16/2018 Plan - continue CRRT >> should be okay to try pulling fluid again now that hemodynamics better  Type 2 Diabetes. Plan - SSI  Moderate to Severe Protein Calorie Malnutrition.  Plan - continue TPN  Acute DVT- Left IJ, subclavian, axillary. Plan - hold heparin gtt in setting of abdominal hematoma  Hypothyroidism. Plan - IV synthroid  Goals of care. Plan - ongoing d/w family and palliative care; long term prognosis for meaningful recovery seems poor  Best practice:  Diet: TPN. DVT prophylaxis: SCDs GI prophylaxis: Protonix Mobility: Bedrest Code Status: Full code Disposition: ICU.    Labs:   CMP Latest Ref Rng & Units 12/30/2018  01/02/2019 12/25/2018  Glucose 70 - 99 mg/dL 145(H) 146(H) 166(H)  BUN 8 - 23 mg/dL 41(H) 41(H) 43(H)  Creatinine 0.44 - 1.00 mg/dL 0.96 1.00 0.92  Sodium 135 - 145 mmol/L 140 139 138  Potassium 3.5 - 5.1 mmol/L 3.6 3.6 4.0  Chloride 98 - 111 mmol/L 92(L) 91(L) 102  CO2 22 - 32 mmol/L 36(H) 32 28  Calcium 8.9 - 10.3 mg/dL 9.5 9.4 8.0(L)  Total Protein 6.5 - 8.1 g/dL - 4.9(L) -  Total Bilirubin 0.3 - 1.2 mg/dL - 0.4 -  Alkaline Phos 38 - 126 U/L - 139(H) -  AST 15 - 41 U/L - 25 -  ALT 0 - 44 U/L - 19 -   CBC Latest Ref Rng & Units 12/22/2018 01/04/2019 12/18/2018  WBC 4.0 - 10.5 K/uL 21.9(H) - 19.1(H)  Hemoglobin 12.0 - 15.0 g/dL 9.2(L) 9.6(L) 9.3(L)  Hematocrit 36.0 - 46.0 % 28.2(L) 29.3(L) 28.5(L)  Platelets 150 - 400 K/uL 206 - 170    CBG (last 3)  Recent Labs    01/12/2019 1751  01/14/2019 0020 01/04/2019 0634  GLUCAP 172* 153* 96    Chesley Mires, MD [facility] 12/30/2018, 8:06 AM

## 2018-12-21 ENCOUNTER — Encounter (HOSPITAL_COMMUNITY): Payer: Self-pay | Admitting: Surgery

## 2018-12-21 ENCOUNTER — Inpatient Hospital Stay (HOSPITAL_COMMUNITY): Payer: BLUE CROSS/BLUE SHIELD

## 2018-12-21 LAB — RENAL FUNCTION PANEL
Albumin: 1.3 g/dL — ABNORMAL LOW (ref 3.5–5.0)
Albumin: 1.3 g/dL — ABNORMAL LOW (ref 3.5–5.0)
Anion gap: 14 (ref 5–15)
Anion gap: 13 (ref 5–15)
BUN: 36 mg/dL — ABNORMAL HIGH (ref 8–23)
BUN: 32 mg/dL — ABNORMAL HIGH (ref 8–23)
CO2: 34 mmol/L — ABNORMAL HIGH (ref 22–32)
CO2: 35 mmol/L — ABNORMAL HIGH (ref 22–32)
Calcium: 7.2 mg/dL — ABNORMAL LOW (ref 8.9–10.3)
Calcium: 8.7 mg/dL — ABNORMAL LOW (ref 8.9–10.3)
Chloride: 92 mmol/L — ABNORMAL LOW (ref 98–111)
Chloride: 90 mmol/L — ABNORMAL LOW (ref 98–111)
Creatinine, Ser: 0.91 mg/dL (ref 0.44–1.00)
Creatinine, Ser: 0.82 mg/dL (ref 0.44–1.00)
GFR calc Af Amer: >60 mL/min (ref >60)
GFR calc Af Amer: >60 mL/min (ref >60)
GFR calc non Af Amer: >60 mL/min (ref >60)
GFR calc non Af Amer: >60 mL/min (ref >60)
Glucose, Bld: 193 mg/dL — ABNORMAL HIGH (ref 70–99)
Glucose, Bld: 239 mg/dL — ABNORMAL HIGH (ref 70–99)
Phosphorus: 2 mg/dL — ABNORMAL LOW (ref 2.5–4.6)
Phosphorus: 2.9 mg/dL (ref 2.5–4.6)
Potassium: 3.3 mmol/L — ABNORMAL LOW (ref 3.5–5.1)
Potassium: 3.8 mmol/L (ref 3.5–5.1)
Sodium: 140 mmol/L (ref 135–145)
Sodium: 138 mmol/L (ref 135–145)

## 2018-12-21 LAB — MAGNESIUM: Magnesium: 1.6 mg/dL — ABNORMAL LOW (ref 1.7–2.4)

## 2018-12-21 LAB — CBC
HCT: 26.6 % — ABNORMAL LOW (ref 36.0–46.0)
Hemoglobin: 8.7 g/dL — ABNORMAL LOW (ref 12.0–15.0)
MCH: 31.2 pg (ref 26.0–34.0)
MCHC: 32.7 g/dL (ref 30.0–36.0)
MCV: 95.3 fL (ref 80.0–100.0)
Platelets: 199 10*3/uL (ref 150–400)
RBC: 2.79 MIL/uL — ABNORMAL LOW (ref 3.87–5.11)
RDW: 15.2 % (ref 11.5–15.5)
WBC: 20.7 10*3/uL — ABNORMAL HIGH (ref 4.0–10.5)
nRBC: 0.1 % (ref 0.0–0.2)

## 2018-12-21 LAB — GLUCOSE, CAPILLARY
Glucose-Capillary: 180 mg/dL — ABNORMAL HIGH (ref 70–99)
Glucose-Capillary: 198 mg/dL — ABNORMAL HIGH (ref 70–99)
Glucose-Capillary: 219 mg/dL — ABNORMAL HIGH (ref 70–99)
Glucose-Capillary: 276 mg/dL — ABNORMAL HIGH (ref 70–99)

## 2018-12-21 LAB — CALCIUM, IONIZED: Calcium, Ionized, Serum: 5.1 mg/dL (ref 4.5–5.6)

## 2018-12-21 MED ORDER — POTASSIUM PHOSPHATES 15 MMOLE/5ML IV SOLN
15.0000 mmol | Freq: Once | INTRAVENOUS | Status: AC
  Administered 2018-12-21: 15 mmol via INTRAVENOUS
  Filled 2018-12-21: qty 5

## 2018-12-21 MED ORDER — STERILE WATER FOR INJECTION IV SOLN
INTRAVENOUS | Status: AC
  Administered 2018-12-21: 18:00:00 via INTRAVENOUS
  Filled 2018-12-21: qty 1320

## 2018-12-21 MED ORDER — POTASSIUM CHLORIDE 10 MEQ/50ML IV SOLN
10.0000 meq | INTRAVENOUS | Status: AC
  Administered 2018-12-21 (×4): 10 meq via INTRAVENOUS
  Filled 2018-12-21 (×4): qty 50

## 2018-12-21 MED ORDER — MAGNESIUM SULFATE 4 GM/100ML IV SOLN
4.0000 g | Freq: Once | INTRAVENOUS | Status: AC
  Administered 2018-12-21: 4 g via INTRAVENOUS
  Filled 2018-12-21: qty 100

## 2018-12-21 NOTE — TOC Progression Note (Signed)
Transition of Care [facility]) - Progression Note    Patient Details  Name: Carmen Stone MRN: [id number] Date of Birth: [birth year]  [facility]) CM/SW Contact  Jamyla Ard, Marjie Skiff, RN Phone Number: 12/21/2018, 9:57 AM  Clinical Narrative:     7/06 laparotomy, washout, SB anastomosis. Pt still on CRRT and Vent. TOC continuing to follow for progression needs       Readmission Risk Interventions Readmission Risk Prevention Plan 12/09/2018  Transportation Screening Complete  PCP or Specialist Appt within 3-5 Days Not Complete  Not Complete comments not ready for d/c  HRI or [place] Complete  Social Work Consult for [place] Planning/Counseling Complete  Palliative Care Screening Not Applicable  Medication Review [employer]) Complete  Some recent data might be hidden   Marney Doctor RN,BSN [phone]

## 2018-12-21 NOTE — Progress Notes (Addendum)
PHARMACY - ADULT TOTAL PARENTERAL NUTRITION CONSULT NOTE   Pharmacy Consult for TPN Indication: prolonged ileus  HPI: 21 yoF admitted 6/7 with perforated small bowel from closed-loop obstruction and underwent emergent exploratory laparotomy. Returned to OR on 6/9 and again on 6/14 for closure of abdomen.  Patient coded on 12/26/22 and developed multiorgan failure. Remains intubated & on CRRT. Pressors have been weaned off and she is completing antibiotic course for sepsis/PNA.  NPO since admission on 6/7. Pharmacy consulted to start TPN 6/16. D/t acute liver injury from shock, initiated TPN at low rate & advance slowly as requested by surgery.  Patient Measurements: Height: '5\' 4"'  (162.6 cm) Weight: (Unable to weigh due to error on bed.) IBW/kg (Calculated) : 54.7 TPN AdjBW (KG): 59.7 Body mass index is 25.51 kg/m. Usual Weight: 90kg   Intake/Output Summary (Last 24 hours) at 12/21/2018 0823 Last data filed at 12/21/2018 0800 Gross per 24 hour  Intake 4576.84 ml  Output 4093 ml  Net 483.84 ml    Recent Labs    01/06/2019 0345 12/15/2018 1600 12/21/18 0422  NA 140  139 140 140  K 3.6  3.6 3.4* 3.3*  CL 92*  91* 92* 92*  CO2 36*  32 33* 34*  GLUCOSE 145*  146* 291* 193*  BUN 41*  41* 40* 36*  CREATININE 0.96  1.00 0.93 0.91  CALCIUM 9.5  9.4 8.8* 7.2*  PHOS 2.4*  2.4* 2.5 2.0*  MG 1.9  --  1.6*  ALBUMIN 1.3*  1.4* 1.3* 1.3*  ALKPHOS 139*  --   --   AST 25  --   --   ALT 19  --   --   BILITOT 0.4  --   --   TRIG 142  --   --   PREALBUMIN 9.3*  --   --     Significant events:  6/18: OK to increase to goal rate per surgery 6/22: CRRT stopped for holiday; CBG 69 overnight, D50 given but TPN continued (55 units insulin in TPN); BS and BM overnight - initiating trickle feeds 6/23: resuming CRRT, return to high-protein TPN; fevers overnight, CCM removing some central lines; will use chemo port for TPN 6/24: began adding lipids to TPN formula 6/25: trickle feeds stopped  d/t abd distention 6/26: CRRT clotted off overnight; patient to OR this AM for ex lap; RN attempting to restart CRRT post-op but still having issues at this time 6/27: CRRT appears to have been successfully resumed overnight 6/29 OR pus throughout abdomen. Small hole in small bowel 7/3 OR wash out of abd, replace wound VAC, abd remains open 7/6 sched for OR today for wash out abd, replace wound VAC  Central access: 11/29/2018 TPN start date: 11/30/18  ASSESSMENT                                                                                                          Current Nutrition: NPO IVF: On CRRT to keep net neutral I/O  Today:   Glucose (goal 100-150) - Hx DM on 70/30 Novolog  20 units BID PTA.  CBGs now elevated again with lipids in TPN (range 96-276) - Dextrose was not adjusted down yesterday to accommodate for resumption of lipids, leading to excess glucose/lipid calories in TPN  25 units insulin in TPN + ICU resistant scale; only 6 units SSI required yesterday (likely d/t omission of lipids the day prior) but already required 12 units SSI this AM (see above)  Electrolytes - on CRRT; K, Cl, Phos, Mg all low, previously WNL; Ca normalized after changing to 0 Ca bath  Renal - on CRRT, SCr, BUN following; bicarb remains elevated  LFTs - albumin remains low; alk phos elevated but improved to near normal (7/6)  TGs - WNL (7/6)  Prealbumin - remains low but improving  NUTRITIONAL GOALS                                                                                             RD recs: (6/29):  Kcal:1493 kcal, Protein:195-215 gram Fluid:>/= 1.8 L/day  PLAN                                                                                                                          Mag 4g IV x 1  KPhos 15 mmol IV x 1  KCl 10 mEq IV x 4  Will receive ~90 mEq K between IV boluses and increased K in TPN tonight  At 1800 today:  Adjust TPN to 75 ml/hr with reduced  dextrose  30% lipid still on backorder, using 20% lipid formulation in the interim; reduce Dex to match calorie goals  Custom TPN with lipids at goal rate of 75 ml/hr provides: 198 g/day protein, 1494 Kcal/day = 100% support  Electrolytes: resume Mag, Cl; increase K, Phos; Na unchanged; Cl:Ac = max Cl  TPN to contain standard MVI daily, trace elements MWF only due to national backorder.  IVF per MD - CRRT fluids  Continue ICU resistant-scale glycemic control orders q6h; continue 25 units regular insulin in TPN   TPN lab panels Mondays & Thursdays  Ordered BID renal function panels while on CRRT  Reuel Boom, PharmD, BCPS [phone] 12/21/2018, 8:23 AM

## 2018-12-21 NOTE — Progress Notes (Signed)
[facility] ROUNDING NOTE   Subjective:   This is a 63 year old lady with acute kidney injury last baseline 0.79 06/04/2018.  She had acute oliguric acute tubular necrosis in the setting of septic shock from bowel perforation.  Was on CRRT from 6/ 9  to  6/12 was held after cardiac arrest.  She resumed 12/02/2018 after abdominal closure.  Still no signs of renal recovery.  Status post small bowel perforation due to closed-loop SBO and septic shock followed by CCM underwent closure 12/06/2018 new recent bowel leak due to nonhealing anastomosis.  Went back to the OR 12/11/2018 for resection of anastomosis.  She went back to the OR 11/22/2018.  She has a large wound VAC.  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  No history of diabetes mellitus type 2.  UOP 225 cc yesterday.  Has been kept even on CRRT  IV heparin IV TPN IV Zosyn 3.375 g Protonix 40 mg IV every 24 hours  Chest x-ray 7/6 revealed clear lungs except for mild atelectasis right base   Objective:  Vital signs in last 24 hours:  Temp:  [97.3 F (36.3 C)-98.7 F (37.1 C)] 98.7 F (37.1 C) (07/07 1600) Pulse Rate:  [94-118] 106 (07/07 1600) Resp:  [12-27] 18 (07/07 1600) BP: (101-131)/(42-61) 111/51 (07/07 1600) SpO2:  [97 %-100 %] 100 % (07/07 1600) FiO2 (%):  [30 %] 30 % (07/07 1600)  Weight change:  Filed Weights   12/16/18 0500 01/10/2019 0500  Weight: 67.1 kg 67.4 kg    Intake/Output: I/O last 3 completed shifts: In: 6268.5 [I.V.:5956.4; Other:5; IV Piggyback:307.1] Out: 3086 [Urine:298; Emesis/NG output:585; Drains:575; VHQIO:9629; Blood:25]   Intake/Output this shift:  Total I/O In: 2578.7 [I.V.:2252.2; IV Piggyback:326.4] Out: 2906 [Urine:66; Emesis/NG output:150; Other:2690]  Exam: Intubated and sedated CVS- RRR no JVP RS- CTA ant and lat ABD- BS present soft non-distended EXT-1-2+ lower extremity edema   Assessment/ Plan:   Acute kidney injury/ ATN: with  very little signs of recovery.  She has required CRRT since 11/26/2018.  Baseline serum creatinine 0.9.  Acute kidney injury following perforated small bowel with fecal peritonitis and septic shock. Getting citrate AC w/ CRRT, keeping even for now. Poor prognosis.   Hypotension/volume: no vol excess on exam, 8 kg under admit wt. Getting TNA w/ volume load  Anemia - transfuse prn  Perforated small bowel - multiple surgeries w/ significant SB resection.  Diabetes mellitus per primary team  Nutrition continues on TNA  Hypophosphatemia: resolved added with TNA    Kelly Splinter, MD 12/21/2018, 4:29 PM     Basic Metabolic Panel: Recent Labs  Lab 12/25/2018 0419  12/18/18 0500  12/26/2018 0500 01/03/2019 1600 12/23/2018 0345 01/07/2019 1600 12/21/18 0422  NA 137   < > 136   < > 135 138 140  139 140 140  K 4.2   < > 4.3   < > 4.3 4.0 3.6  3.6 3.4* 3.3*  CL 104   < > 103   < > 105 102 92*  91* 92* 92*  CO2 27   < > 26   < > 24 28 36*  32 33* 34*  GLUCOSE 118*   < > 97   < > 147* 166* 145*  146* 291* 193*  BUN 48*   < > 45*   < > 45* 43* 41*  41* 40* 36*  CREATININE 1.00   < > 1.01*   < > 0.94 0.92 0.96  1.00 0.93 0.91  CALCIUM 7.5*   < > 7.5*   < > 7.1* 8.0* 9.5  9.4 8.8* 7.2*  MG 2.2  --  2.2  --  2.2  --  1.9  --  1.6*  PHOS 2.1*   < > 2.4*   < > 2.4* 2.5 2.4*  2.4* 2.5 2.0*   < > = values in this interval not displayed.    Liver Function Tests: Recent Labs  Lab 12/16/18 0600  12/25/2018 0500 01/13/2019 1600 12/30/2018 0345 12/31/2018 1600 12/21/18 0422  AST 24  --   --   --  25  --   --   ALT 22  --   --   --  19  --   --   ALKPHOS 225*  --   --   --  139*  --   --   BILITOT 0.4  --   --   --  0.4  --   --   PROT 6.0*  --   --   --  4.9*  --   --   ALBUMIN 1.4*   < > 1.3* 1.3* 1.3*  1.4* 1.3* 1.3*   < > = values in this interval not displayed.   No results for input(s): LIPASE, AMYLASE in the last 168 hours. No results for input(s): AMMONIA in the last 168  hours.  CBC: Recent Labs  Lab 12/18/18 1916 12/30/2018 0500 01/03/2019 1700 12/25/2018 2009 01/06/2019 0345 12/21/18 0422  WBC 24.6* 33.0* 19.1*  --  21.9* 20.7*  NEUTROABS  --   --   --   --  16.9*  --   HGB 6.8* 6.0* 9.3* 9.6* 9.2* 8.7*  HCT 21.3* 18.4* 28.5* 29.3* 28.2* 26.6*  MCV 97.3 95.3 91.6  --  90.7 95.3  PLT 243 177 170  --  206 199     Medications:   .  prismasol BGK 4/2.5 400 mL/hr at 12/21/18 1217  . sodium chloride 7.5 mL/hr at 12/21/18 1600  . calcium gluconate infusion for CRRT 20 g (12/21/18 1215)  . fentaNYL infusion INTRAVENOUS 75 mcg/hr (12/21/18 1600)  . piperacillin-tazobactam (ZOSYN)  IV Stopped (12/21/18 1234)  . potassium chloride    . potassium PHOSPHATE IVPB (in mmol) 43 mL/hr at 12/21/18 1600  . prismasol B22GK 4/0 1,700 mL/hr at 12/21/18 1545  . sodium citrate 2 %/dextrose 2.5% solution 3000 mL 480 mL/hr at 12/21/18 1435  . TPN ADULT (ION) 85 mL/hr at 12/21/18 1600  . TPN ADULT (ION)     . chlorhexidine gluconate (MEDLINE KIT)  15 mL Mouth Rinse BID  . Chlorhexidine Gluconate Cloth  6 each Topical Daily  . dorzolamide-timolol  1 drop Both Eyes BID  . insulin aspart  3-9 Units Subcutaneous Q6H  . levothyroxine  12.5 mcg Intravenous Daily  . mouth rinse  15 mL Mouth Rinse 10 times per day  . pantoprazole (PROTONIX) IV  40 mg Intravenous Q24H   sodium chloride, albuterol, alteplase, fentaNYL (SUBLIMAZE) injection, heparin, hydrALAZINE, metoprolol tartrate, sodium chloride, sodium chloride flush

## 2018-12-21 NOTE — Progress Notes (Addendum)
1 Day Post-Op   Subjective/Chief Complaint: On CRRT. No acute overnight events as per nursing.  Objective: Vital signs in last 24 hours: Temp:  [95.9 F (35.5 C)-98.5 F (36.9 C)] 97.3 F (36.3 C) (07/07 0320) Pulse Rate:  [95-118] 102 (07/07 0700) Resp:  [12-24] 19 (07/07 0700) BP: (101-174)/(47-84) 109/48 (07/07 0700) SpO2:  [100 %] 100 % (07/07 0756) FiO2 (%):  [30 %] 30 % (07/07 0756) Last BM Date: 12/12/18  Intake/Output from previous day: 07/06 0701 - 07/07 0700 In: 4498.7 [I.V.:4265.7; IV Piggyback:233] Out: 3948 [Urine:228; Emesis/NG output:110; Drains:100; Blood:25] Intake/Output this shift: Total I/O In: 10 [I.V.:10] Out: -   Exam: On Vent Abdomen soft, Non-distended, midline wound clean/dry with moist kerlex in wound bed  NG somewhat bilious   Lab Results:  Recent Labs    01/03/2019 0345 12/21/18 0422  WBC 21.9* 20.7*  HGB 9.2* 8.7*  HCT 28.2* 26.6*  PLT 206 199   BMET Recent Labs    12/29/2018 1600 12/21/18 0422  NA 140 140  K 3.4* 3.3*  CL 92* 92*  CO2 33* 34*  GLUCOSE 291* 193*  BUN 40* 36*  CREATININE 0.93 0.91  CALCIUM 8.8* 7.2*   PT/INR Recent Labs    12/22/2018 0628  LABPROT 14.9  INR 1.2   ABG No results for input(s): PHART, HCO3 in the last 72 hours.  Invalid input(s): PCO2, PO2  Studies/Results: Dg Abd 1 View  Result Date: 12/18/2018 CLINICAL DATA:  Adjustment of nasogastric tube. EXAM: ABDOMEN - 1 VIEW COMPARISON:  Earlier today at 3:04 p.m. FINDINGS: 6:59 p.m. The nasogastric tube has been advanced and terminates at the gastric antrum with the side port at the level of the gastric body. Mild right hemidiaphragm elevation. Non-obstructive bowel gas pattern. Contrast within normal caliber colon. No free intraperitoneal air. Probable cholecystectomy clips. IMPRESSION: Nasogastric tube terminates at the gastric antrum. Electronically Signed   By: Abigail Miyamoto M.D.   On: 01/06/2019 20:06   Dg Chest Port 1 View  Result Date:  12/17/2018 CLINICAL DATA:  Respiratory failure. EXAM: PORTABLE CHEST 1 VIEW COMPARISON:  12/18/2018 and 12/23/2018 FINDINGS: Endotracheal tube tip is 4.7 cm above the carina. NG tube tip is below the diaphragm. Port-A-Cath and 2 central lines all appear in good position, unchanged in the superior vena cava. Heart size and vascularity are normal. Minimal atelectasis at the right base laterally. IMPRESSION: Minimal atelectasis at the right lung base.  No significant change. Electronically Signed   By: Lorriane Shire M.D.   On: 12/29/2018 05:23   Dg Abd Portable 1v  Result Date: 01/10/2019 CLINICAL DATA:  63 year old female with vomiting despite NG tube placement. EXAM: PORTABLE ABDOMEN - 1 VIEW COMPARISON:  CT Abdomen and Pelvis 12/09/2018. FINDINGS: Portable AP view at 1504 hours. NG tube tip is at the level of the gastric body. Side hole is at the level of the gastric cardia. Nonobstructed visible bowel gas pattern. There is some retained oral contrast at the non dilated hepatic flexure. Stable cholecystectomy clips. Partially visible central lines in the right mediastinum. Partially visible left chest port, accessed. Stable lung bases. No acute osseous abnormality identified. Right axillary surgical clips. IMPRESSION: Enteric tube side hole at the level of the gastric cardia and nonobstructed visible bowel gas pattern. Electronically Signed   By: Genevie Ann M.D.   On: 12/16/2018 15:39    Anti-infectives: Anti-infectives (From admission, onward)   Start     Dose/Rate Route Frequency Ordered Stop   11/26/2018 1030  clindamycin (CLEOCIN) 900 mg, gentamicin (GARAMYCIN) 240 mg in sodium chloride 0.9 % 1,000 mL for intraperitoneal lavage      Irrigation To Surgery 11/18/2018 1026 11/20/2018 1042   12/11/18 1500  anidulafungin (ERAXIS) 100 mg in sodium chloride 0.9 % 100 mL IVPB  Status:  Discontinued     100 mg 78 mL/hr over 100 Minutes Intravenous Every 24 hours 12/09/2018 1007 01/01/2019 1103   12/11/18 0600   piperacillin-tazobactam (ZOSYN) IVPB 3.375 g     3.375 g 100 mL/hr over 30 Minutes Intravenous Every 6 hours 12/11/18 0317     12/09/2018 1800  piperacillin-tazobactam (ZOSYN) IVPB 2.25 g  Status:  Discontinued     2.25 g 100 mL/hr over 30 Minutes Intravenous Every 6 hours 12/09/2018 1413 12/11/18 0317   12/09/2018 1100  anidulafungin (ERAXIS) 200 mg in sodium chloride 0.9 % 200 mL IVPB     200 mg 78 mL/hr over 200 Minutes Intravenous  Once 11/30/2018 1007 11/15/2018 1456   11/15/2018 1100  piperacillin-tazobactam (ZOSYN) IVPB 3.375 g     3.375 g 100 mL/hr over 30 Minutes Intravenous  Once 11/15/2018 1027 11/26/2018 1245   12/08/18 1800  vancomycin (VANCOCIN) IVPB 750 mg/150 ml premix  Status:  Discontinued     750 mg 150 mL/hr over 60 Minutes Intravenous Every 48 hours 12/07/18 0822 12/07/18 0903   12/08/18 1000  ceFEPIme (MAXIPIME) 2 g in sodium chloride 0.9 % 100 mL IVPB  Status:  Discontinued     2 g 200 mL/hr over 30 Minutes Intravenous Every 24 hours 12/07/18 0822 12/07/18 0903   12/07/18 2200  ceFEPIme (MAXIPIME) 2 g in sodium chloride 0.9 % 100 mL IVPB  Status:  Discontinued     2 g 200 mL/hr over 30 Minutes Intravenous Every 12 hours 12/07/18 0903 12/01/2018 1027   12/07/18 1800  vancomycin (VANCOCIN) IVPB 750 mg/150 ml premix  Status:  Discontinued     750 mg 150 mL/hr over 60 Minutes Intravenous Every 24 hours 12/07/18 0903 12/09/18 1040   12/06/18 1800  vancomycin (VANCOCIN) IVPB 750 mg/150 ml premix  Status:  Discontinued     750 mg 150 mL/hr over 60 Minutes Intravenous Every 24 hours 12/05/18 1652 12/07/18 0822   12/05/18 1800  ceFEPIme (MAXIPIME) 2 g in sodium chloride 0.9 % 100 mL IVPB  Status:  Discontinued     2 g 200 mL/hr over 30 Minutes Intravenous Every 12 hours 12/05/18 1644 12/07/18 0822   12/05/18 1700  vancomycin (VANCOCIN) 1,250 mg in sodium chloride 0.9 % 250 mL IVPB     1,250 mg 166.7 mL/hr over 90 Minutes Intravenous  Once 12/05/18 1652 12/05/18 2019   12/11/2018 1800   fluconazole (DIFLUCAN) IVPB 200 mg  Status:  Discontinued     200 mg 100 mL/hr over 60 Minutes Intravenous Every 24 hours 12/12/2018 0748 12/04/2018 0847   12/09/2018 1800  fluconazole (DIFLUCAN) IVPB 400 mg  Status:  Discontinued     400 mg 100 mL/hr over 120 Minutes Intravenous Every 24 hours 11/17/2018 0847 11/18/2018 0856   11/24/2018 1400  piperacillin-tazobactam (ZOSYN) IVPB 2.25 g  Status:  Discontinued     2.25 g 100 mL/hr over 30 Minutes Intravenous Every 6 hours 12/06/2018 0748 12/01/18 1104   11/24/2018 0600  cefoTEtan (CEFOTAN) 2 g in sodium chloride 0.9 % 100 mL IVPB     2 g 200 mL/hr over 30 Minutes Intravenous On call to O.R. 11/22/18 1300 11/20/2018 0624   11/22/18 1800  fluconazole (DIFLUCAN)  IVPB 400 mg  Status:  Discontinued     400 mg 100 mL/hr over 120 Minutes Intravenous Every 24 hours 11/29/2018 1528 12/01/2018 0748   11/22/18 1800  vancomycin (VANCOCIN) 1,500 mg in sodium chloride 0.9 % 500 mL IVPB  Status:  Discontinued     1,500 mg 250 mL/hr over 120 Minutes Intravenous Every 24 hours 11/22/18 0358 11/22/18 0750   11/22/18 0115  vancomycin (VANCOCIN) 1,500 mg in sodium chloride 0.9 % 500 mL IVPB     1,500 mg 250 mL/hr over 120 Minutes Intravenous  Once 11/22/18 0101 11/22/18 0319   12/05/2018 1630  fluconazole (DIFLUCAN) IVPB 800 mg     800 mg 100 mL/hr over 240 Minutes Intravenous  Once 11/19/2018 1527 11/20/2018 2013   12/06/2018 1600  piperacillin-tazobactam (ZOSYN) IVPB 3.375 g  Status:  Discontinued     3.375 g 12.5 mL/hr over 240 Minutes Intravenous Every 8 hours 11/16/2018 1522 11/16/2018 0748   11/20/2018 0545  piperacillin-tazobactam (ZOSYN) IVPB 3.375 g     3.375 g 100 mL/hr over 30 Minutes Intravenous  Once 11/29/2018 0530 12/05/2018 0626   12/09/2018 0530  piperacillin-tazobactam (ZOSYN) IVPB 4.5 g  Status:  Discontinued     4.5 g 200 mL/hr over 30 Minutes Intravenous  Once 11/18/2018 0517 12/01/2018 0529      Assessment/Plan: s/p Procedure(s): EXPLORATORY LAPAROTOMY WITH WOUND WASHOUT  AND SMALL BOWEL ANASTOMOSIS - 12/16/2018  -Remains critically ill.   -Doing ok thus far. WBC remains elevated. No pressors at this time. Would continue IV abx given intraoperative findings of extensive amounts of necrotic fat - including pieces of mesentery that had to remain in situ so as not to devascularize remaining small bowel  -Continue IV nutrition; NG to low intermittent wall suction -Appreciate CCMs assistance in her care -PPx: SCDs; if hgb remains stable over next 24hrs, can consider restarting prophylactic anticoagulation   LOS: 30 days   Ileana Roup 12/21/2018

## 2018-12-21 NOTE — Progress Notes (Signed)
I was able to update her brother Iona Beard on her current status via phone today. Questions were welcomed and answered  Sharon Mt. Dema Severin, M.D. [facility]

## 2018-12-21 NOTE — Progress Notes (Signed)
NAME:  Carmen Stone, MRN:  [id number], DOB:  27-Feb-1956, LOS: 32 ADMISSION DATE:  11/24/2018, CONSULTATION DATE:  11/21/2018 REFERRING MD:  Dr. Ninfa Linden, Surgery, CHIEF COMPLAINT:  Abdominal pain   Brief History   63 y/o female presented to ER 12/07/2018 with nausea, vomiting, abdominal pain.  CT abd/pelvis showed focal perforated loop of SB with free air with multiple complex ventral hernias.  Underwent emergent laparotomy with SB resection, lysis of adhesions and wound vac.  Remained on vent and pressors post op.  Hospital course complicated by respiratory arrest 6/13, multiple reintubations, intermittent vasopressor needs, AKI on CVVHD.     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 Changing volume goal on CRRT to -100 cc an hour. Weaned 7 hours PSV   6/16 Encephalopathic. Hypertensive, adding labetalol.  IV TNA started 6/17 A little more awake.  Tolerating PSV but SBI exceeds 105.  Volume removal with CVVHD 6/18 Neuro status improved.  Required brief vasoactive drips.  Net negative fluid volume status.PSV 6/19 Extubated 6/20 reintubated for altered mentation, head CT negative  6/21 Tx PRBC x1.  Off vasopressors 6/23 Febrile overnight, Blood Cx. Transfuse 1 U PRBC 6/24 Afebrile. Remains off pressors. 100cc/hr removal /c CRRT. 30% FiO2.  6/25 L IJ, subclavian, axillary  + DVT. Heparin initiated. Remains on CRRT.  6/26  CT ABD/pelvis positive for free air and ascites. Back to OR  for ex lap of perforated viscous, resection of previous anastomosis, application of wound vac.  1U RBCs intra op.   6/28 Back on pressors with hgb <7 / sedated on fent drip, PRBC 6/29 Pending OR for possible closure  7/01 Less sedation, following commands, abd open with VAC, on CVVHD (even) 7/03 OR, unable to close but bowel "looks some better", hematoma in abd, on CVVHD 7/05 hemoglobin drop with red drainage from wound vac, heparin gtt held, transfuse PRBC 7/06 laparotomy, washout, SB anastomosis  Consults:  Renal 6/09 AKI, acidosis Cardiology 6/09 Takotsubo CM  Palliative care 6/30 Goals of care  Procedures:  ETT 6/07 >> 6/19, 6/20 >> Rt IJ HD 6/09 >> Rt [place] CVL 6/09 >> Lt radial aline 6/09 >> discontinued  Significant Diagnostic Tests:  CT abd/pelvis 6/07 >> focal perforated loop of SB with free air with multiple complex ventral hernias Echo 6/08 >> EF 20%, Takotsubo CM CT head 6/20 >> negative for any acute findings CT abdomen 6/20 >> bilateral effusions, atelectasis left lower lobe, results noted.    CT Chest 6/20 >> small bilateral pleural effusions with compressive atelectasis, anterior R 4-7 rib fractures CT ABD/Pelvis 6/20 >> small bowel surgical changes, mild circumferential wall thickening of scattered small bowel loops, small to moderate ascites LUE Duplex 6/24 >> DVT L IJ, subclavian, axillary, SVT L cephalic  CT ABD/Pelvis 7/51 >> large volume ascites with intraperitoneal free air   Micro Data:  COVID 6/07 >> negative Tracheal aspirate 6/20 >> E Coli sensitive to zosyn BCx2 6/23 >> negative BCx2  6/26 >> negative   Antimicrobials:  Zosyn 6/07 >> 6/17 Diflucan 6/07 >> 6/10 Cefepime 6/21 >>6/26 Vanco 6/21 >>  6/25 Anidulafungin 6/26 >> 7/06 Zosyn 6/26 >>  Interim history/subjective:  Remains on vent, CRRT.  Appears uncomfortable.  Objective   Blood pressure (!) 106/43, pulse 99, temperature 97.8 F (36.6 C), temperature source Oral, resp. rate 20, height 5\' 4"  (1.626 m), weight 67.4 kg, SpO2 100 %.    Vent Mode: PRVC FiO2 (%):  [30 %] 30 % Set Rate:   [18 bmp] 18 bmp Vt Set:  [430 mL] 430 mL PEEP:  [5 cmH20] 5 cmH20 Plateau Pressure:  [18 cmH20-24 cmH20] 20 cmH20   Intake/Output Summary (Last 24 hours) at 12/21/2018 0834 Last data filed at 12/21/2018 0800 Gross per 24 hour  Intake 4576.84 ml  Output 4093 ml  Net 483.84 ml   Filed Weights   12/16/18 0500 01/05/2019 0500  Weight: 67.1 kg 67.4 kg    Examination:  General - sedated Eyes - squints eyes with stimulation ENT - ETT in place Cardiac - regular rate/rhythm, no murmur Chest - no wheeze/rales Abdomen - wound dressing clean Extremities - decreased muscle bulk Skin - no rashes Neuro - RASS -1  Resolved Hospital Problem list   Metabolic alkalosis, Abdominal peritonitis with septic shock, Circulatory shock, Cardiorespiratory arrest 6/13, Shock liver, Hypophosphatemia, thrombocytopenia  Assessment & Plan:   Acute hypoxic respiratory failure in setting of septic shock with peritonitis. Failure to wean - failed extubation attempt 3 times. Plan - can cycle on pressure support as tolerated - not candidate for extubation given overall status - defer decision about tracheostomy until it is clear her abdominal processes can stabilize - f/u CXR intermittently  Small Bowel Perforation s/p Resection, Lysis of Adhesions. - surgery not optimistic her SB anastomosis will hold and she will likely developed leak again; not sure any additional options if this happens. Plan - post op care, nutrition per CCS  Acute Metabolic Encephalopathy. Plan - RASS goal 0 to -1  Fever. - could be related to abdominal hematoma - E coli also in sputum from 6/21 Plan - day 11/14 of ABx  Anemia from blood loss with intraabdominal hematoma. 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 Plan - monitor hemodynamics - f/u Echo when more stable - prn hydralazine for SBP > 160  Acute kidney  injury from tubular necrosis. -baseline creatinine of 0.73 from 12/11/2018 Plan - CRRT   Type 2 Diabetes. Plan - SSI  Moderate to Severe Protein Calorie Malnutrition.  Plan - continue TPN  Acute DVT- Left IJ, subclavian, axillary. Plan - hold heparin gtt in setting of abdominal hematoma  Hypothyroidism. Plan - IV synthroid  Goals of care. Plan - palliative care working with family  Best practice:  Diet: TPN. DVT prophylaxis: SCDs GI prophylaxis: Protonix Mobility: Bedrest Code Status: Full code Disposition: ICU.    Labs:   CMP Latest Ref Rng & Units 12/21/2018 01/05/2019 01/08/2019  Glucose 70 - 99 mg/dL 193(H) 291(H) 145(H)  BUN 8 - 23 mg/dL 36(H) 40(H) 41(H)  Creatinine 0.44 - 1.00 mg/dL 0.91 0.93 0.96  Sodium 135 - 145 mmol/L 140 140 140  Potassium 3.5 - 5.1 mmol/L 3.3(L) 3.4(L) 3.6  Chloride 98 - 111 mmol/L 92(L) 92(L) 92(L)  CO2 22 - 32 mmol/L 34(H) 33(H) 36(H)  Calcium 8.9 - 10.3 mg/dL 7.2(L) 8.8(L) 9.5  Total Protein 6.5 - 8.1 g/dL - - -  Total Bilirubin 0.3 - 1.2 mg/dL - - -  Alkaline Phos  38 - 126 U/L - - -  AST 15 - 41 U/L - - -  ALT 0 - 44 U/L - - -   CBC Latest Ref Rng & Units 12/21/2018 01/02/2019 01/01/2019  WBC 4.0 - 10.5 K/uL 20.7(H) 21.9(H) -  Hemoglobin 12.0 - 15.0 g/dL 8.7(L) 9.2(L) 9.6(L)  Hematocrit 36.0 - 46.0 % 26.6(L) 28.2(L) 29.3(L)  Platelets 150 - 400 K/uL 199 206 -    CBG (last 3)  Recent Labs    12/31/2018 1810 12/21/18 0007 12/21/18 0013  GLUCAP 96 219* 276*   D/w Dr. Nadara Eaton, MD [facility] Pulmonary/Critical Care 12/21/2018, 8:34 AM

## 2018-12-21 NOTE — Progress Notes (Addendum)
Pharmacy Antibiotic Note  Carmen Stone is a 63 y.o. female admitted on 12/12/2018 with perforated loop of SB. Her hospital stay has been complicated by cardiac arrest requiring intubation & pressors as well as CRRT for AKI.  She completed 10d course of Zosyn on 6/17.   She had low grade fever of 100.25F & bradycardia requiring pressors to be resumed today.  WBC has also been trending up off Zosyn.  Chest CT 6/20 could not rule out pneumonia.  Pharmacy was consulted for Vancomycin & Cefepime dosing.  6/23: new fevers, BCx ordered, removed 1 of 2 central lines, changed Foley, using Port for TPN 6/26 Fevers this AM and taken back to OR this AM revealing perforated bowel, cefepime changed to Zosyn + Eraxis 6/29 OR pus throughout abdomen. Small hole in small bowel 7/5 to OR for wash out,  Wound VAC exchange  Today, 12/21/2018:  Day 11/14 of Zosyn  WBC remain elevated ~20 but stable  Afebrile since 6/27  Remains on CRRT  Eraxis stopped 7/6  Plan:  Continue Zosyn 3.375 g IV q6 hr while on CRRT; CCM plans to continue for 3 more days  Pharmacy will sign off of note writing but will follow daily while on CRRT and adjust dosing as appropriate  Height: 5\' 4"  (162.6 cm) Weight: (Unable to weigh due to error on bed.) IBW/kg (Calculated) : 54.7  Temp (24hrs), Avg:97.6 F (36.4 C), Min:95.9 F (35.5 C), Max:98.5 F (36.9 C)  Recent Labs  Lab 12/18/18 1916 12/30/2018 0500 12/16/2018 1600 12/31/2018 1700 12/30/2018 0345 01/08/2019 1600 12/21/18 0422  WBC 24.6* 33.0*  --  19.1* 21.9*  --  20.7*  CREATININE  --  0.94 0.92  --  0.96  1.00 0.93 0.91    Estimated Creatinine Clearance: 59 mL/min (by C-G formula based on SCr of 0.91 mg/dL).    No Known Allergies  Antimicrobials this admission: 6/7 zosyn > 6/17  6/26 Zosyn >> 6/7 fluconazole > 6/11 6/8 vancomycin >> 6/8, resumed 6/21>> 6/24 6/21 cefepime>> 6/26 6/26 Eraxis >>  Dose adjustments this admission: - Frequent adjustments d/t  CRRT stopping/starting  Microbiology results: 6/7 Covid 19 negative 6/8 MRSA PCR: negative 6/21 Tracheal aspirate: rare E.Coli; R to ampicillin and Unasyn F 6/23 BCx (x1): ngF 6/26 BCx2: ngF  Thank you for allowing pharmacy to be a part of this patient's care.  Reuel Boom, PharmD, BCPS [phone] 12/21/2018, 9:38 AM

## 2018-12-22 DIAGNOSIS — Z515 Encounter for palliative care: Secondary | ICD-10-CM

## 2018-12-22 DIAGNOSIS — R0600 Dyspnea, unspecified: Secondary | ICD-10-CM

## 2018-12-22 DIAGNOSIS — Z7189 Other specified counseling: Secondary | ICD-10-CM

## 2018-12-22 LAB — CBC
HCT: 25.5 % — ABNORMAL LOW (ref 36.0–46.0)
Hemoglobin: 8.1 g/dL — ABNORMAL LOW (ref 12.0–15.0)
MCH: 30.8 pg (ref 26.0–34.0)
MCHC: 31.8 g/dL (ref 30.0–36.0)
MCV: 97 fL (ref 80.0–100.0)
Platelets: 232 10*3/uL (ref 150–400)
RBC: 2.63 MIL/uL — ABNORMAL LOW (ref 3.87–5.11)
RDW: 15.2 % (ref 11.5–15.5)
WBC: 20 10*3/uL — ABNORMAL HIGH (ref 4.0–10.5)
nRBC: 0.2 % (ref 0.0–0.2)

## 2018-12-22 LAB — POCT I-STAT, CHEM 8
BUN: 22 mg/dL (ref 8–23)
BUN: 22 mg/dL (ref 8–23)
BUN: 23 mg/dL (ref 8–23)
BUN: 23 mg/dL (ref 8–23)
BUN: 23 mg/dL (ref 8–23)
BUN: 23 mg/dL (ref 8–23)
BUN: 24 mg/dL — ABNORMAL HIGH (ref 8–23)
BUN: 24 mg/dL — ABNORMAL HIGH (ref 8–23)
BUN: 25 mg/dL — ABNORMAL HIGH (ref 8–23)
BUN: 25 mg/dL — ABNORMAL HIGH (ref 8–23)
BUN: 25 mg/dL — ABNORMAL HIGH (ref 8–23)
BUN: 25 mg/dL — ABNORMAL HIGH (ref 8–23)
BUN: 26 mg/dL — ABNORMAL HIGH (ref 8–23)
BUN: 26 mg/dL — ABNORMAL HIGH (ref 8–23)
BUN: 27 mg/dL — ABNORMAL HIGH (ref 8–23)
BUN: 27 mg/dL — ABNORMAL HIGH (ref 8–23)
BUN: 27 mg/dL — ABNORMAL HIGH (ref 8–23)
BUN: 28 mg/dL — ABNORMAL HIGH (ref 8–23)
BUN: 28 mg/dL — ABNORMAL HIGH (ref 8–23)
BUN: 29 mg/dL — ABNORMAL HIGH (ref 8–23)
BUN: 30 mg/dL — ABNORMAL HIGH (ref 8–23)
BUN: 30 mg/dL — ABNORMAL HIGH (ref 8–23)
BUN: 30 mg/dL — ABNORMAL HIGH (ref 8–23)
BUN: 30 mg/dL — ABNORMAL HIGH (ref 8–23)
BUN: 30 mg/dL — ABNORMAL HIGH (ref 8–23)
BUN: 30 mg/dL — ABNORMAL HIGH (ref 8–23)
BUN: 30 mg/dL — ABNORMAL HIGH (ref 8–23)
BUN: 30 mg/dL — ABNORMAL HIGH (ref 8–23)
BUN: 30 mg/dL — ABNORMAL HIGH (ref 8–23)
BUN: 31 mg/dL — ABNORMAL HIGH (ref 8–23)
BUN: 31 mg/dL — ABNORMAL HIGH (ref 8–23)
BUN: 31 mg/dL — ABNORMAL HIGH (ref 8–23)
BUN: 31 mg/dL — ABNORMAL HIGH (ref 8–23)
BUN: 31 mg/dL — ABNORMAL HIGH (ref 8–23)
BUN: 31 mg/dL — ABNORMAL HIGH (ref 8–23)
BUN: 32 mg/dL — ABNORMAL HIGH (ref 8–23)
BUN: 32 mg/dL — ABNORMAL HIGH (ref 8–23)
BUN: 32 mg/dL — ABNORMAL HIGH (ref 8–23)
BUN: 32 mg/dL — ABNORMAL HIGH (ref 8–23)
BUN: 33 mg/dL — ABNORMAL HIGH (ref 8–23)
BUN: 34 mg/dL — ABNORMAL HIGH (ref 8–23)
BUN: 34 mg/dL — ABNORMAL HIGH (ref 8–23)
BUN: 35 mg/dL — ABNORMAL HIGH (ref 8–23)
BUN: 35 mg/dL — ABNORMAL HIGH (ref 8–23)
BUN: 35 mg/dL — ABNORMAL HIGH (ref 8–23)
Calcium, Ion: 0.3 mmol/L — CL (ref 1.15–1.40)
Calcium, Ion: 0.3 mmol/L — CL (ref 1.15–1.40)
Calcium, Ion: 0.35 mmol/L — CL (ref 1.15–1.40)
Calcium, Ion: 0.36 mmol/L — CL (ref 1.15–1.40)
Calcium, Ion: 0.39 mmol/L — CL (ref 1.15–1.40)
Calcium, Ion: 0.41 mmol/L — CL (ref 1.15–1.40)
Calcium, Ion: 0.41 mmol/L — CL (ref 1.15–1.40)
Calcium, Ion: 0.45 mmol/L — CL (ref 1.15–1.40)
Calcium, Ion: 0.46 mmol/L — CL (ref 1.15–1.40)
Calcium, Ion: 0.48 mmol/L — CL (ref 1.15–1.40)
Calcium, Ion: 0.53 mmol/L — CL (ref 1.15–1.40)
Calcium, Ion: 0.58 mmol/L — CL (ref 1.15–1.40)
Calcium, Ion: 0.58 mmol/L — CL (ref 1.15–1.40)
Calcium, Ion: 0.59 mmol/L — CL (ref 1.15–1.40)
Calcium, Ion: 0.6 mmol/L — CL (ref 1.15–1.40)
Calcium, Ion: 0.6 mmol/L — CL (ref 1.15–1.40)
Calcium, Ion: 0.61 mmol/L — CL (ref 1.15–1.40)
Calcium, Ion: 0.63 mmol/L — CL (ref 1.15–1.40)
Calcium, Ion: 0.63 mmol/L — CL (ref 1.15–1.40)
Calcium, Ion: 0.65 mmol/L — CL (ref 1.15–1.40)
Calcium, Ion: 0.67 mmol/L — CL (ref 1.15–1.40)
Calcium, Ion: 0.79 mmol/L — CL (ref 1.15–1.40)
Calcium, Ion: 0.79 mmol/L — CL (ref 1.15–1.40)
Calcium, Ion: 0.82 mmol/L — CL (ref 1.15–1.40)
Calcium, Ion: 0.83 mmol/L — CL (ref 1.15–1.40)
Calcium, Ion: 0.84 mmol/L — CL (ref 1.15–1.40)
Calcium, Ion: 0.87 mmol/L — CL (ref 1.15–1.40)
Calcium, Ion: 0.92 mmol/L — ABNORMAL LOW (ref 1.15–1.40)
Calcium, Ion: 0.93 mmol/L — ABNORMAL LOW (ref 1.15–1.40)
Calcium, Ion: 0.94 mmol/L — ABNORMAL LOW (ref 1.15–1.40)
Calcium, Ion: 0.96 mmol/L — ABNORMAL LOW (ref 1.15–1.40)
Calcium, Ion: 0.97 mmol/L — ABNORMAL LOW (ref 1.15–1.40)
Calcium, Ion: 0.97 mmol/L — ABNORMAL LOW (ref 1.15–1.40)
Calcium, Ion: 0.98 mmol/L — ABNORMAL LOW (ref 1.15–1.40)
Calcium, Ion: 0.98 mmol/L — ABNORMAL LOW (ref 1.15–1.40)
Calcium, Ion: 0.99 mmol/L — ABNORMAL LOW (ref 1.15–1.40)
Calcium, Ion: 0.99 mmol/L — ABNORMAL LOW (ref 1.15–1.40)
Calcium, Ion: 1 mmol/L — ABNORMAL LOW (ref 1.15–1.40)
Calcium, Ion: 1.01 mmol/L — ABNORMAL LOW (ref 1.15–1.40)
Calcium, Ion: 1.06 mmol/L — ABNORMAL LOW (ref 1.15–1.40)
Calcium, Ion: 1.1 mmol/L — ABNORMAL LOW (ref 1.15–1.40)
Calcium, Ion: 1.16 mmol/L (ref 1.15–1.40)
Calcium, Ion: 1.17 mmol/L (ref 1.15–1.40)
Calcium, Ion: 1.18 mmol/L (ref 1.15–1.40)
Calcium, Ion: <0.3 mmol/L — CL (ref 1.15–1.40)
Chloride: 100 mmol/L (ref 98–111)
Chloride: 101 mmol/L (ref 98–111)
Chloride: 103 mmol/L (ref 98–111)
Chloride: 104 mmol/L (ref 98–111)
Chloride: 82 mmol/L — ABNORMAL LOW (ref 98–111)
Chloride: 83 mmol/L — ABNORMAL LOW (ref 98–111)
Chloride: 86 mmol/L — ABNORMAL LOW (ref 98–111)
Chloride: 86 mmol/L — ABNORMAL LOW (ref 98–111)
Chloride: 87 mmol/L — ABNORMAL LOW (ref 98–111)
Chloride: 87 mmol/L — ABNORMAL LOW (ref 98–111)
Chloride: 87 mmol/L — ABNORMAL LOW (ref 98–111)
Chloride: 87 mmol/L — ABNORMAL LOW (ref 98–111)
Chloride: 87 mmol/L — ABNORMAL LOW (ref 98–111)
Chloride: 87 mmol/L — ABNORMAL LOW (ref 98–111)
Chloride: 87 mmol/L — ABNORMAL LOW (ref 98–111)
Chloride: 88 mmol/L — ABNORMAL LOW (ref 98–111)
Chloride: 88 mmol/L — ABNORMAL LOW (ref 98–111)
Chloride: 88 mmol/L — ABNORMAL LOW (ref 98–111)
Chloride: 88 mmol/L — ABNORMAL LOW (ref 98–111)
Chloride: 88 mmol/L — ABNORMAL LOW (ref 98–111)
Chloride: 88 mmol/L — ABNORMAL LOW (ref 98–111)
Chloride: 88 mmol/L — ABNORMAL LOW (ref 98–111)
Chloride: 89 mmol/L — ABNORMAL LOW (ref 98–111)
Chloride: 89 mmol/L — ABNORMAL LOW (ref 98–111)
Chloride: 89 mmol/L — ABNORMAL LOW (ref 98–111)
Chloride: 89 mmol/L — ABNORMAL LOW (ref 98–111)
Chloride: 89 mmol/L — ABNORMAL LOW (ref 98–111)
Chloride: 89 mmol/L — ABNORMAL LOW (ref 98–111)
Chloride: 89 mmol/L — ABNORMAL LOW (ref 98–111)
Chloride: 89 mmol/L — ABNORMAL LOW (ref 98–111)
Chloride: 91 mmol/L — ABNORMAL LOW (ref 98–111)
Chloride: 92 mmol/L — ABNORMAL LOW (ref 98–111)
Chloride: 92 mmol/L — ABNORMAL LOW (ref 98–111)
Chloride: 92 mmol/L — ABNORMAL LOW (ref 98–111)
Chloride: 94 mmol/L — ABNORMAL LOW (ref 98–111)
Chloride: 94 mmol/L — ABNORMAL LOW (ref 98–111)
Chloride: 96 mmol/L — ABNORMAL LOW (ref 98–111)
Chloride: 96 mmol/L — ABNORMAL LOW (ref 98–111)
Chloride: 96 mmol/L — ABNORMAL LOW (ref 98–111)
Chloride: 97 mmol/L — ABNORMAL LOW (ref 98–111)
Chloride: 98 mmol/L (ref 98–111)
Chloride: 98 mmol/L (ref 98–111)
Chloride: 99 mmol/L (ref 98–111)
Chloride: 99 mmol/L (ref 98–111)
Chloride: 99 mmol/L (ref 98–111)
Creatinine, Ser: 0.5 mg/dL (ref 0.44–1.00)
Creatinine, Ser: 0.5 mg/dL (ref 0.44–1.00)
Creatinine, Ser: 0.5 mg/dL (ref 0.44–1.00)
Creatinine, Ser: 0.6 mg/dL (ref 0.44–1.00)
Creatinine, Ser: 0.6 mg/dL (ref 0.44–1.00)
Creatinine, Ser: 0.6 mg/dL (ref 0.44–1.00)
Creatinine, Ser: 0.6 mg/dL (ref 0.44–1.00)
Creatinine, Ser: 0.6 mg/dL (ref 0.44–1.00)
Creatinine, Ser: 0.6 mg/dL (ref 0.44–1.00)
Creatinine, Ser: 0.6 mg/dL (ref 0.44–1.00)
Creatinine, Ser: 0.6 mg/dL (ref 0.44–1.00)
Creatinine, Ser: 0.6 mg/dL (ref 0.44–1.00)
Creatinine, Ser: 0.6 mg/dL (ref 0.44–1.00)
Creatinine, Ser: 0.6 mg/dL (ref 0.44–1.00)
Creatinine, Ser: 0.6 mg/dL (ref 0.44–1.00)
Creatinine, Ser: 0.7 mg/dL (ref 0.44–1.00)
Creatinine, Ser: 0.7 mg/dL (ref 0.44–1.00)
Creatinine, Ser: 0.7 mg/dL (ref 0.44–1.00)
Creatinine, Ser: 0.7 mg/dL (ref 0.44–1.00)
Creatinine, Ser: 0.7 mg/dL (ref 0.44–1.00)
Creatinine, Ser: 0.7 mg/dL (ref 0.44–1.00)
Creatinine, Ser: 0.7 mg/dL (ref 0.44–1.00)
Creatinine, Ser: 0.7 mg/dL (ref 0.44–1.00)
Creatinine, Ser: 0.7 mg/dL (ref 0.44–1.00)
Creatinine, Ser: 0.7 mg/dL (ref 0.44–1.00)
Creatinine, Ser: 0.7 mg/dL (ref 0.44–1.00)
Creatinine, Ser: 0.7 mg/dL (ref 0.44–1.00)
Creatinine, Ser: 0.7 mg/dL (ref 0.44–1.00)
Creatinine, Ser: 0.7 mg/dL (ref 0.44–1.00)
Creatinine, Ser: 0.7 mg/dL (ref 0.44–1.00)
Creatinine, Ser: 0.8 mg/dL (ref 0.44–1.00)
Creatinine, Ser: 0.8 mg/dL (ref 0.44–1.00)
Creatinine, Ser: 0.8 mg/dL (ref 0.44–1.00)
Creatinine, Ser: 0.8 mg/dL (ref 0.44–1.00)
Creatinine, Ser: 0.8 mg/dL (ref 0.44–1.00)
Creatinine, Ser: 0.8 mg/dL (ref 0.44–1.00)
Creatinine, Ser: 0.8 mg/dL (ref 0.44–1.00)
Creatinine, Ser: 0.8 mg/dL (ref 0.44–1.00)
Creatinine, Ser: 0.8 mg/dL (ref 0.44–1.00)
Creatinine, Ser: 0.8 mg/dL (ref 0.44–1.00)
Creatinine, Ser: 0.8 mg/dL (ref 0.44–1.00)
Creatinine, Ser: 0.8 mg/dL (ref 0.44–1.00)
Creatinine, Ser: 0.8 mg/dL (ref 0.44–1.00)
Creatinine, Ser: 0.9 mg/dL (ref 0.44–1.00)
Creatinine, Ser: 1 mg/dL (ref 0.44–1.00)
Glucose, Bld: 118 mg/dL — ABNORMAL HIGH (ref 70–99)
Glucose, Bld: 122 mg/dL — ABNORMAL HIGH (ref 70–99)
Glucose, Bld: 124 mg/dL — ABNORMAL HIGH (ref 70–99)
Glucose, Bld: 124 mg/dL — ABNORMAL HIGH (ref 70–99)
Glucose, Bld: 125 mg/dL — ABNORMAL HIGH (ref 70–99)
Glucose, Bld: 136 mg/dL — ABNORMAL HIGH (ref 70–99)
Glucose, Bld: 137 mg/dL — ABNORMAL HIGH (ref 70–99)
Glucose, Bld: 147 mg/dL — ABNORMAL HIGH (ref 70–99)
Glucose, Bld: 148 mg/dL — ABNORMAL HIGH (ref 70–99)
Glucose, Bld: 149 mg/dL — ABNORMAL HIGH (ref 70–99)
Glucose, Bld: 152 mg/dL — ABNORMAL HIGH (ref 70–99)
Glucose, Bld: 158 mg/dL — ABNORMAL HIGH (ref 70–99)
Glucose, Bld: 164 mg/dL — ABNORMAL HIGH (ref 70–99)
Glucose, Bld: 175 mg/dL — ABNORMAL HIGH (ref 70–99)
Glucose, Bld: 177 mg/dL — ABNORMAL HIGH (ref 70–99)
Glucose, Bld: 181 mg/dL — ABNORMAL HIGH (ref 70–99)
Glucose, Bld: 183 mg/dL — ABNORMAL HIGH (ref 70–99)
Glucose, Bld: 183 mg/dL — ABNORMAL HIGH (ref 70–99)
Glucose, Bld: 184 mg/dL — ABNORMAL HIGH (ref 70–99)
Glucose, Bld: 192 mg/dL — ABNORMAL HIGH (ref 70–99)
Glucose, Bld: 193 mg/dL — ABNORMAL HIGH (ref 70–99)
Glucose, Bld: 197 mg/dL — ABNORMAL HIGH (ref 70–99)
Glucose, Bld: 198 mg/dL — ABNORMAL HIGH (ref 70–99)
Glucose, Bld: 198 mg/dL — ABNORMAL HIGH (ref 70–99)
Glucose, Bld: 209 mg/dL — ABNORMAL HIGH (ref 70–99)
Glucose, Bld: 209 mg/dL — ABNORMAL HIGH (ref 70–99)
Glucose, Bld: 211 mg/dL — ABNORMAL HIGH (ref 70–99)
Glucose, Bld: 211 mg/dL — ABNORMAL HIGH (ref 70–99)
Glucose, Bld: 212 mg/dL — ABNORMAL HIGH (ref 70–99)
Glucose, Bld: 213 mg/dL — ABNORMAL HIGH (ref 70–99)
Glucose, Bld: 216 mg/dL — ABNORMAL HIGH (ref 70–99)
Glucose, Bld: 221 mg/dL — ABNORMAL HIGH (ref 70–99)
Glucose, Bld: 229 mg/dL — ABNORMAL HIGH (ref 70–99)
Glucose, Bld: 238 mg/dL — ABNORMAL HIGH (ref 70–99)
Glucose, Bld: 239 mg/dL — ABNORMAL HIGH (ref 70–99)
Glucose, Bld: 248 mg/dL — ABNORMAL HIGH (ref 70–99)
Glucose, Bld: 255 mg/dL — ABNORMAL HIGH (ref 70–99)
Glucose, Bld: 258 mg/dL — ABNORMAL HIGH (ref 70–99)
Glucose, Bld: 262 mg/dL — ABNORMAL HIGH (ref 70–99)
Glucose, Bld: 274 mg/dL — ABNORMAL HIGH (ref 70–99)
Glucose, Bld: 278 mg/dL — ABNORMAL HIGH (ref 70–99)
Glucose, Bld: 286 mg/dL — ABNORMAL HIGH (ref 70–99)
Glucose, Bld: 289 mg/dL — ABNORMAL HIGH (ref 70–99)
Glucose, Bld: 297 mg/dL — ABNORMAL HIGH (ref 70–99)
Glucose, Bld: 301 mg/dL — ABNORMAL HIGH (ref 70–99)
HCT: 19 % — ABNORMAL LOW (ref 36.0–46.0)
HCT: 19 % — ABNORMAL LOW (ref 36.0–46.0)
HCT: 21 % — ABNORMAL LOW (ref 36.0–46.0)
HCT: 22 % — ABNORMAL LOW (ref 36.0–46.0)
HCT: 22 % — ABNORMAL LOW (ref 36.0–46.0)
HCT: 23 % — ABNORMAL LOW (ref 36.0–46.0)
HCT: 23 % — ABNORMAL LOW (ref 36.0–46.0)
HCT: 23 % — ABNORMAL LOW (ref 36.0–46.0)
HCT: 24 % — ABNORMAL LOW (ref 36.0–46.0)
HCT: 24 % — ABNORMAL LOW (ref 36.0–46.0)
HCT: 25 % — ABNORMAL LOW (ref 36.0–46.0)
HCT: 25 % — ABNORMAL LOW (ref 36.0–46.0)
HCT: 25 % — ABNORMAL LOW (ref 36.0–46.0)
HCT: 25 % — ABNORMAL LOW (ref 36.0–46.0)
HCT: 25 % — ABNORMAL LOW (ref 36.0–46.0)
HCT: 26 % — ABNORMAL LOW (ref 36.0–46.0)
HCT: 26 % — ABNORMAL LOW (ref 36.0–46.0)
HCT: 26 % — ABNORMAL LOW (ref 36.0–46.0)
HCT: 27 % — ABNORMAL LOW (ref 36.0–46.0)
HCT: 27 % — ABNORMAL LOW (ref 36.0–46.0)
HCT: 27 % — ABNORMAL LOW (ref 36.0–46.0)
HCT: 27 % — ABNORMAL LOW (ref 36.0–46.0)
HCT: 27 % — ABNORMAL LOW (ref 36.0–46.0)
HCT: 27 % — ABNORMAL LOW (ref 36.0–46.0)
HCT: 27 % — ABNORMAL LOW (ref 36.0–46.0)
HCT: 28 % — ABNORMAL LOW (ref 36.0–46.0)
HCT: 28 % — ABNORMAL LOW (ref 36.0–46.0)
HCT: 28 % — ABNORMAL LOW (ref 36.0–46.0)
HCT: 28 % — ABNORMAL LOW (ref 36.0–46.0)
HCT: 28 % — ABNORMAL LOW (ref 36.0–46.0)
HCT: 28 % — ABNORMAL LOW (ref 36.0–46.0)
HCT: 28 % — ABNORMAL LOW (ref 36.0–46.0)
HCT: 29 % — ABNORMAL LOW (ref 36.0–46.0)
HCT: 29 % — ABNORMAL LOW (ref 36.0–46.0)
HCT: 29 % — ABNORMAL LOW (ref 36.0–46.0)
HCT: 29 % — ABNORMAL LOW (ref 36.0–46.0)
HCT: 30 % — ABNORMAL LOW (ref 36.0–46.0)
HCT: 30 % — ABNORMAL LOW (ref 36.0–46.0)
HCT: 31 % — ABNORMAL LOW (ref 36.0–46.0)
HCT: 31 % — ABNORMAL LOW (ref 36.0–46.0)
HCT: 31 % — ABNORMAL LOW (ref 36.0–46.0)
HCT: 32 % — ABNORMAL LOW (ref 36.0–46.0)
HCT: 32 % — ABNORMAL LOW (ref 36.0–46.0)
HCT: 32 % — ABNORMAL LOW (ref 36.0–46.0)
HCT: 39 % (ref 36.0–46.0)
Hemoglobin: 10.2 g/dL — ABNORMAL LOW (ref 12.0–15.0)
Hemoglobin: 10.2 g/dL — ABNORMAL LOW (ref 12.0–15.0)
Hemoglobin: 10.5 g/dL — ABNORMAL LOW (ref 12.0–15.0)
Hemoglobin: 10.5 g/dL — ABNORMAL LOW (ref 12.0–15.0)
Hemoglobin: 10.5 g/dL — ABNORMAL LOW (ref 12.0–15.0)
Hemoglobin: 10.9 g/dL — ABNORMAL LOW (ref 12.0–15.0)
Hemoglobin: 10.9 g/dL — ABNORMAL LOW (ref 12.0–15.0)
Hemoglobin: 10.9 g/dL — ABNORMAL LOW (ref 12.0–15.0)
Hemoglobin: 13.3 g/dL (ref 12.0–15.0)
Hemoglobin: 6.5 g/dL — CL (ref 12.0–15.0)
Hemoglobin: 6.5 g/dL — CL (ref 12.0–15.0)
Hemoglobin: 7.1 g/dL — ABNORMAL LOW (ref 12.0–15.0)
Hemoglobin: 7.5 g/dL — ABNORMAL LOW (ref 12.0–15.0)
Hemoglobin: 7.5 g/dL — ABNORMAL LOW (ref 12.0–15.0)
Hemoglobin: 7.8 g/dL — ABNORMAL LOW (ref 12.0–15.0)
Hemoglobin: 7.8 g/dL — ABNORMAL LOW (ref 12.0–15.0)
Hemoglobin: 7.8 g/dL — ABNORMAL LOW (ref 12.0–15.0)
Hemoglobin: 8.2 g/dL — ABNORMAL LOW (ref 12.0–15.0)
Hemoglobin: 8.2 g/dL — ABNORMAL LOW (ref 12.0–15.0)
Hemoglobin: 8.5 g/dL — ABNORMAL LOW (ref 12.0–15.0)
Hemoglobin: 8.5 g/dL — ABNORMAL LOW (ref 12.0–15.0)
Hemoglobin: 8.5 g/dL — ABNORMAL LOW (ref 12.0–15.0)
Hemoglobin: 8.5 g/dL — ABNORMAL LOW (ref 12.0–15.0)
Hemoglobin: 8.5 g/dL — ABNORMAL LOW (ref 12.0–15.0)
Hemoglobin: 8.8 g/dL — ABNORMAL LOW (ref 12.0–15.0)
Hemoglobin: 8.8 g/dL — ABNORMAL LOW (ref 12.0–15.0)
Hemoglobin: 8.8 g/dL — ABNORMAL LOW (ref 12.0–15.0)
Hemoglobin: 9.2 g/dL — ABNORMAL LOW (ref 12.0–15.0)
Hemoglobin: 9.2 g/dL — ABNORMAL LOW (ref 12.0–15.0)
Hemoglobin: 9.2 g/dL — ABNORMAL LOW (ref 12.0–15.0)
Hemoglobin: 9.2 g/dL — ABNORMAL LOW (ref 12.0–15.0)
Hemoglobin: 9.2 g/dL — ABNORMAL LOW (ref 12.0–15.0)
Hemoglobin: 9.2 g/dL — ABNORMAL LOW (ref 12.0–15.0)
Hemoglobin: 9.2 g/dL — ABNORMAL LOW (ref 12.0–15.0)
Hemoglobin: 9.5 g/dL — ABNORMAL LOW (ref 12.0–15.0)
Hemoglobin: 9.5 g/dL — ABNORMAL LOW (ref 12.0–15.0)
Hemoglobin: 9.5 g/dL — ABNORMAL LOW (ref 12.0–15.0)
Hemoglobin: 9.5 g/dL — ABNORMAL LOW (ref 12.0–15.0)
Hemoglobin: 9.5 g/dL — ABNORMAL LOW (ref 12.0–15.0)
Hemoglobin: 9.5 g/dL — ABNORMAL LOW (ref 12.0–15.0)
Hemoglobin: 9.5 g/dL — ABNORMAL LOW (ref 12.0–15.0)
Hemoglobin: 9.9 g/dL — ABNORMAL LOW (ref 12.0–15.0)
Hemoglobin: 9.9 g/dL — ABNORMAL LOW (ref 12.0–15.0)
Hemoglobin: 9.9 g/dL — ABNORMAL LOW (ref 12.0–15.0)
Hemoglobin: 9.9 g/dL — ABNORMAL LOW (ref 12.0–15.0)
Potassium: 2.1 mmol/L — CL (ref 3.5–5.1)
Potassium: 2.6 mmol/L — CL (ref 3.5–5.1)
Potassium: 2.7 mmol/L — CL (ref 3.5–5.1)
Potassium: 2.7 mmol/L — CL (ref 3.5–5.1)
Potassium: 2.8 mmol/L — ABNORMAL LOW (ref 3.5–5.1)
Potassium: 3 mmol/L — ABNORMAL LOW (ref 3.5–5.1)
Potassium: 3.1 mmol/L — ABNORMAL LOW (ref 3.5–5.1)
Potassium: 3.2 mmol/L — ABNORMAL LOW (ref 3.5–5.1)
Potassium: 3.2 mmol/L — ABNORMAL LOW (ref 3.5–5.1)
Potassium: 3.2 mmol/L — ABNORMAL LOW (ref 3.5–5.1)
Potassium: 3.3 mmol/L — ABNORMAL LOW (ref 3.5–5.1)
Potassium: 3.3 mmol/L — ABNORMAL LOW (ref 3.5–5.1)
Potassium: 3.3 mmol/L — ABNORMAL LOW (ref 3.5–5.1)
Potassium: 3.3 mmol/L — ABNORMAL LOW (ref 3.5–5.1)
Potassium: 3.3 mmol/L — ABNORMAL LOW (ref 3.5–5.1)
Potassium: 3.3 mmol/L — ABNORMAL LOW (ref 3.5–5.1)
Potassium: 3.3 mmol/L — ABNORMAL LOW (ref 3.5–5.1)
Potassium: 3.4 mmol/L — ABNORMAL LOW (ref 3.5–5.1)
Potassium: 3.4 mmol/L — ABNORMAL LOW (ref 3.5–5.1)
Potassium: 3.4 mmol/L — ABNORMAL LOW (ref 3.5–5.1)
Potassium: 3.5 mmol/L (ref 3.5–5.1)
Potassium: 3.5 mmol/L (ref 3.5–5.1)
Potassium: 3.5 mmol/L (ref 3.5–5.1)
Potassium: 3.5 mmol/L (ref 3.5–5.1)
Potassium: 3.6 mmol/L (ref 3.5–5.1)
Potassium: 3.6 mmol/L (ref 3.5–5.1)
Potassium: 3.6 mmol/L (ref 3.5–5.1)
Potassium: 3.6 mmol/L (ref 3.5–5.1)
Potassium: 3.6 mmol/L (ref 3.5–5.1)
Potassium: 3.6 mmol/L (ref 3.5–5.1)
Potassium: 3.6 mmol/L (ref 3.5–5.1)
Potassium: 3.7 mmol/L (ref 3.5–5.1)
Potassium: 3.7 mmol/L (ref 3.5–5.1)
Potassium: 3.8 mmol/L (ref 3.5–5.1)
Potassium: 3.8 mmol/L (ref 3.5–5.1)
Potassium: 3.8 mmol/L (ref 3.5–5.1)
Potassium: 3.8 mmol/L (ref 3.5–5.1)
Potassium: 3.9 mmol/L (ref 3.5–5.1)
Potassium: 3.9 mmol/L (ref 3.5–5.1)
Potassium: 4.1 mmol/L (ref 3.5–5.1)
Potassium: 4.1 mmol/L (ref 3.5–5.1)
Potassium: 4.1 mmol/L (ref 3.5–5.1)
Potassium: 4.2 mmol/L (ref 3.5–5.1)
Potassium: 4.2 mmol/L (ref 3.5–5.1)
Potassium: 4.5 mmol/L (ref 3.5–5.1)
Sodium: 135 mmol/L (ref 135–145)
Sodium: 135 mmol/L (ref 135–145)
Sodium: 136 mmol/L (ref 135–145)
Sodium: 136 mmol/L (ref 135–145)
Sodium: 136 mmol/L (ref 135–145)
Sodium: 136 mmol/L (ref 135–145)
Sodium: 137 mmol/L (ref 135–145)
Sodium: 137 mmol/L (ref 135–145)
Sodium: 137 mmol/L (ref 135–145)
Sodium: 137 mmol/L (ref 135–145)
Sodium: 137 mmol/L (ref 135–145)
Sodium: 138 mmol/L (ref 135–145)
Sodium: 138 mmol/L (ref 135–145)
Sodium: 138 mmol/L (ref 135–145)
Sodium: 138 mmol/L (ref 135–145)
Sodium: 139 mmol/L (ref 135–145)
Sodium: 139 mmol/L (ref 135–145)
Sodium: 139 mmol/L (ref 135–145)
Sodium: 139 mmol/L (ref 135–145)
Sodium: 139 mmol/L (ref 135–145)
Sodium: 139 mmol/L (ref 135–145)
Sodium: 139 mmol/L (ref 135–145)
Sodium: 139 mmol/L (ref 135–145)
Sodium: 139 mmol/L (ref 135–145)
Sodium: 139 mmol/L (ref 135–145)
Sodium: 139 mmol/L (ref 135–145)
Sodium: 139 mmol/L (ref 135–145)
Sodium: 139 mmol/L (ref 135–145)
Sodium: 139 mmol/L (ref 135–145)
Sodium: 140 mmol/L (ref 135–145)
Sodium: 140 mmol/L (ref 135–145)
Sodium: 140 mmol/L (ref 135–145)
Sodium: 140 mmol/L (ref 135–145)
Sodium: 140 mmol/L (ref 135–145)
Sodium: 140 mmol/L (ref 135–145)
Sodium: 140 mmol/L (ref 135–145)
Sodium: 140 mmol/L (ref 135–145)
Sodium: 141 mmol/L (ref 135–145)
Sodium: 141 mmol/L (ref 135–145)
Sodium: 141 mmol/L (ref 135–145)
Sodium: 141 mmol/L (ref 135–145)
Sodium: 142 mmol/L (ref 135–145)
Sodium: 144 mmol/L (ref 135–145)
Sodium: 144 mmol/L (ref 135–145)
Sodium: 145 mmol/L (ref 135–145)
TCO2: 22 mmol/L (ref 22–32)
TCO2: 22 mmol/L (ref 22–32)
TCO2: 24 mmol/L (ref 22–32)
TCO2: 24 mmol/L (ref 22–32)
TCO2: 24 mmol/L (ref 22–32)
TCO2: 24 mmol/L (ref 22–32)
TCO2: 25 mmol/L (ref 22–32)
TCO2: 25 mmol/L (ref 22–32)
TCO2: 25 mmol/L (ref 22–32)
TCO2: 27 mmol/L (ref 22–32)
TCO2: 27 mmol/L (ref 22–32)
TCO2: 27 mmol/L (ref 22–32)
TCO2: 28 mmol/L (ref 22–32)
TCO2: 29 mmol/L (ref 22–32)
TCO2: 31 mmol/L (ref 22–32)
TCO2: 31 mmol/L (ref 22–32)
TCO2: 31 mmol/L (ref 22–32)
TCO2: 31 mmol/L (ref 22–32)
TCO2: 31 mmol/L (ref 22–32)
TCO2: 32 mmol/L (ref 22–32)
TCO2: 32 mmol/L (ref 22–32)
TCO2: 32 mmol/L (ref 22–32)
TCO2: 33 mmol/L — ABNORMAL HIGH (ref 22–32)
TCO2: 33 mmol/L — ABNORMAL HIGH (ref 22–32)
TCO2: 33 mmol/L — ABNORMAL HIGH (ref 22–32)
TCO2: 33 mmol/L — ABNORMAL HIGH (ref 22–32)
TCO2: 33 mmol/L — ABNORMAL HIGH (ref 22–32)
TCO2: 34 mmol/L — ABNORMAL HIGH (ref 22–32)
TCO2: 34 mmol/L — ABNORMAL HIGH (ref 22–32)
TCO2: 34 mmol/L — ABNORMAL HIGH (ref 22–32)
TCO2: 34 mmol/L — ABNORMAL HIGH (ref 22–32)
TCO2: 35 mmol/L — ABNORMAL HIGH (ref 22–32)
TCO2: 35 mmol/L — ABNORMAL HIGH (ref 22–32)
TCO2: 35 mmol/L — ABNORMAL HIGH (ref 22–32)
TCO2: 36 mmol/L — ABNORMAL HIGH (ref 22–32)
TCO2: 36 mmol/L — ABNORMAL HIGH (ref 22–32)
TCO2: 36 mmol/L — ABNORMAL HIGH (ref 22–32)
TCO2: 36 mmol/L — ABNORMAL HIGH (ref 22–32)
TCO2: 36 mmol/L — ABNORMAL HIGH (ref 22–32)
TCO2: 38 mmol/L — ABNORMAL HIGH (ref 22–32)
TCO2: 38 mmol/L — ABNORMAL HIGH (ref 22–32)
TCO2: 38 mmol/L — ABNORMAL HIGH (ref 22–32)
TCO2: 39 mmol/L — ABNORMAL HIGH (ref 22–32)
TCO2: 39 mmol/L — ABNORMAL HIGH (ref 22–32)
TCO2: 43 mmol/L — ABNORMAL HIGH (ref 22–32)

## 2018-12-22 LAB — RENAL FUNCTION PANEL
Albumin: 1.2 g/dL — ABNORMAL LOW (ref 3.5–5.0)
Albumin: 1.2 g/dL — ABNORMAL LOW (ref 3.5–5.0)
Anion gap: 15 (ref 5–15)
Anion gap: 19 — ABNORMAL HIGH (ref 5–15)
BUN: 29 mg/dL — ABNORMAL HIGH (ref 8–23)
BUN: 35 mg/dL — ABNORMAL HIGH (ref 8–23)
CO2: 35 mmol/L — ABNORMAL HIGH (ref 22–32)
CO2: 41 mmol/L — ABNORMAL HIGH (ref 22–32)
Calcium: 10.4 mg/dL — ABNORMAL HIGH (ref 8.9–10.3)
Calcium: 12 mg/dL — ABNORMAL HIGH (ref 8.9–10.3)
Chloride: 88 mmol/L — ABNORMAL LOW (ref 98–111)
Chloride: 82 mmol/L — ABNORMAL LOW (ref 98–111)
Creatinine, Ser: 0.74 mg/dL (ref 0.44–1.00)
Creatinine, Ser: 0.95 mg/dL (ref 0.44–1.00)
GFR calc Af Amer: >60 mL/min (ref >60)
GFR calc Af Amer: >60 mL/min (ref >60)
GFR calc non Af Amer: >60 mL/min (ref >60)
GFR calc non Af Amer: >60 mL/min (ref >60)
Glucose, Bld: 186 mg/dL — ABNORMAL HIGH (ref 70–99)
Glucose, Bld: 201 mg/dL — ABNORMAL HIGH (ref 70–99)
Phosphorus: 2.4 mg/dL — ABNORMAL LOW (ref 2.5–4.6)
Phosphorus: 3.4 mg/dL (ref 2.5–4.6)
Potassium: 4.4 mmol/L (ref 3.5–5.1)
Potassium: 3.4 mmol/L — ABNORMAL LOW (ref 3.5–5.1)
Sodium: 138 mmol/L (ref 135–145)
Sodium: 142 mmol/L (ref 135–145)

## 2018-12-22 LAB — GLUCOSE, CAPILLARY
Glucose-Capillary: 155 mg/dL — ABNORMAL HIGH (ref 70–99)
Glucose-Capillary: 167 mg/dL — ABNORMAL HIGH (ref 70–99)
Glucose-Capillary: 168 mg/dL — ABNORMAL HIGH (ref 70–99)
Glucose-Capillary: 200 mg/dL — ABNORMAL HIGH (ref 70–99)
Glucose-Capillary: 203 mg/dL — ABNORMAL HIGH (ref 70–99)
Glucose-Capillary: 243 mg/dL — ABNORMAL HIGH (ref 70–99)

## 2018-12-22 LAB — MAGNESIUM: Magnesium: 2 mg/dL (ref 1.7–2.4)

## 2018-12-22 LAB — CALCIUM, IONIZED: Calcium, Ionized, Serum: 3.7 mg/dL — ABNORMAL LOW (ref 4.5–5.6)

## 2018-12-22 MED ORDER — TRACE MINERALS CR-CU-MN-SE-ZN 10-1000-500-60 MCG/ML IV SOLN
INTRAVENOUS | Status: DC
  Filled 2018-12-22: qty 1320

## 2018-12-22 MED ORDER — TRACE MINERALS CR-CU-MN-SE-ZN 10-1000-500-60 MCG/ML IV SOLN
INTRAVENOUS | Status: AC
  Administered 2018-12-22: 18:00:00 via INTRAVENOUS
  Filled 2018-12-22: qty 1320

## 2018-12-22 MED ORDER — SODIUM PHOSPHATES 45 MMOLE/15ML IV SOLN
15.0000 mmol | Freq: Once | INTRAVENOUS | Status: AC
  Administered 2018-12-22: 15 mmol via INTRAVENOUS
  Filled 2018-12-22: qty 5

## 2018-12-22 NOTE — Consult Note (Signed)
[place] Nurse wound consult note Reason for Consult: Open abdominal incision with feculent effluent oozing from proximal end.  No further surgical options at this time. I have been asked to apply an Eakin pouch Wound type: Surgical with anastomotic leak Pressure Injury POA: NA Measurement: 16 cm x 1 cm x 1.5 cm  Wound bed: Necrotic tissue with feculent effluent present. 25% necrotic 75% pale pink nongranulating Drainage (amount, consistency, odor) see above Periwound:intact Dressing procedure/placement/frequency: Cleanse abdominal wound with NS.  Cover periwound with Hydrocolloid strips.  Apply Large Eakin pouch.  Change as needed.  [place] team will follow.  Domenic Moras MSN, RN, FNP-BC CWON Wound, Ostomy, Continence Nurse Pager [phone]

## 2018-12-22 NOTE — Progress Notes (Signed)
Daily Progress Note   Patient Name: Carmen Stone       Date: 12/22/2018 DOB: [birth year]  Age: 63 y.o. MRN#: [id number] Attending Physician: Nolon Nations, MD Primary Care Physician: Jettie Booze, NP Admit Date: 12/07/2018  Reason for Consultation/Follow-up: Establishing goals of care  Subjective:   Ms. Goehring is resting in bed, she appears chronically ill, doesn't arouse or engage.   I called and spoke with her brother, Iona Beard. Also discussed with PCCM colleague ms Alfredo Martinez, NP, see below.   Length of Stay: 31  Current Medications: Scheduled Meds:  . chlorhexidine gluconate (MEDLINE KIT)  15 mL Mouth Rinse BID  . Chlorhexidine Gluconate Cloth  6 each Topical Daily  . dorzolamide-timolol  1 drop Both Eyes BID  . insulin aspart  3-9 Units Subcutaneous Q6H  . levothyroxine  12.5 mcg Intravenous Daily  . mouth rinse  15 mL Mouth Rinse 10 times per day  . pantoprazole (PROTONIX) IV  40 mg Intravenous Q24H    Continuous Infusions: .  prismasol BGK 4/2.5 400 mL/hr at 12/22/18 1310  . sodium chloride Stopped (12/22/18 1231)  . calcium gluconate infusion for CRRT 20 g (12/22/18 0530)  . fentaNYL infusion INTRAVENOUS 50 mcg/hr (12/22/18 1357)  . piperacillin-tazobactam (ZOSYN)  IV 100 mL/hr at 12/22/18 1300  . prismasol B22GK 4/0 1,700 mL/hr at 12/22/18 1206  . sodium citrate 2 %/dextrose 2.5% solution 3000 mL 510 mL/hr at 12/22/18 0929  . sodium phosphate  Dextrose 5% IVPB 43 mL/hr at 12/22/18 1300  . TPN ADULT (ION) 75 mL/hr at 12/22/18 1300  . TPN ADULT (ION)      PRN Meds: sodium chloride, albuterol, alteplase, fentaNYL (SUBLIMAZE) injection, heparin, hydrALAZINE, metoprolol tartrate, sodium chloride, sodium chloride flush  Physical Exam         General: Intubated.  Moves her  head some when her name is called. Heart: Regular rate and rhythm. No murmur appreciated. Lungs: Good air movement  Abdomen:  abdominal wound drainage noted earlier today Ext: No significant edema Skin: Warm and dry  Vital Signs: BP (!) 107/46   Pulse (!) 111   Temp 98.9 F (37.2 C) (Oral)   Resp 18   Ht '5\' 4"'  (1.626 m)   Wt 64.7 kg   SpO2 100%   BMI 24.48 kg/m  SpO2: SpO2: 100 %  O2 Device: O2 Device: Ventilator O2 Flow Rate: O2 Flow Rate (L/min): 4 L/min  Intake/output summary:   Intake/Output Summary (Last 24 hours) at 12/22/2018 1404 Last data filed at 12/22/2018 1400 Gross per 24 hour  Intake 6487.06 ml  Output 6606 ml  Net -118.94 ml   LBM: Last BM Date: (stool possibly present in abdominal wound.) Baseline Weight: Weight: 74.8 kg Most recent weight: Weight: 64.7 kg       Palliative Assessment/Data:    Flowsheet Rows     Most Recent Value  Intake Tab  Referral Department  Surgery  Unit at Time of Referral  ICU  Palliative Care Primary Diagnosis  Sepsis/Infectious Disease  Date Notified  12/05/2018  Palliative Care Type  New Palliative care  Reason for referral  Clarify Goals of Care  Date of Admission  12/11/2018  Date first seen by Palliative Care  12/14/18  # of days Palliative referral response time  1 Day(s)  # of days IP prior to Palliative referral  22  Clinical Assessment  Palliative Performance Scale Score  10%  Psychosocial & Spiritual Assessment  Palliative Care Outcomes  Palliative Care Outcomes  Clarified goals of care      Patient Active Problem List   Diagnosis Date Noted  . Acute respiratory failure with hypoxia and hypercapnia ([place]) 12/12/2018  . Toxic metabolic encephalopathy 69/62/9528  . Thrombocytopenia, acquired ([place]) 12/12/2018  . Small bowel perforation ([place])   . Pressure injury of skin 11/29/2018  . Encounter for central line placement   . Septic shock ([place])   . AKI (acute kidney injury) ([place])   . S/P dialysis catheter  insertion ([place])   . 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 Perforated viscus 11/22/2018  . Port-A-Cath in place 08/03/2017  . Port catheter in place 01/31/2016  . Hypersensitivity reaction 02/08/2014  . Drug induced neutropenia(288.03) 01/30/2014  . Nausea with vomiting 11/04/2013  . 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 ATN (acute tubular necrosis) 11/02/2013  . Acute respiratory failure ([place]) 11/02/2013  . Acute respiratory failure with hypoxia ([place]) 10/25/2013  . Abnormal urinalysis 10/25/2013  . Postoperative anemia due to acute blood loss 10/24/2013  . Breast abscess of female 10/22/2013  . HYPERLIPIDEMIA 07/16/2010  . ATELECTASIS 07/16/2010  . Type 2 diabetes mellitus ([place]) 06/24/2010  . 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 HYPOXEMIA 06/24/2010  . SMALL BOWEL OBSTRUCTION, HX OF 06/24/2010    Palliative Care Assessment & Plan   Patient Profile: 63 y.o. female  with past medical history of  admitted on 11/15/2018 with abdominal pain from perforation and complicated clinical course with multiple surgeries requiring resection of large portion of small bowel, code blue, and multisystem organ failure (EF 20%, on CRRT and vent dependent).     Assessment: Patient Active Problem List   Diagnosis Date Noted  . Acute respiratory failure with hypoxia and hypercapnia ([place]) 12/12/2018  . Toxic metabolic encephalopathy 41/32/4401  . Thrombocytopenia, acquired ([place]) 12/12/2018  . Small bowel perforation ([place])   . Pressure injury of skin 11/29/2018  . Encounter for central line placement   . Septic shock ([place])   . AKI (acute kidney injury) ([place])   . S/P dialysis catheter insertion ([place])   . 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 Perforated viscus 12/07/2018  . Port-A-Cath in place 08/03/2017  . Port catheter in place 01/31/2016  . Hypersensitivity reaction 02/08/2014  . Drug induced  neutropenia(288.03) 01/30/2014  . Nausea  with vomiting 11/04/2013  . 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 ATN (acute tubular necrosis) 11/02/2013  . Acute respiratory failure ([place]) 11/02/2013  . Acute respiratory failure with hypoxia ([place]) 10/25/2013  . Abnormal urinalysis 10/25/2013  . Postoperative anemia due to acute blood loss 10/24/2013  . Breast abscess of female 10/22/2013  . HYPERLIPIDEMIA 07/16/2010  . ATELECTASIS 07/16/2010  . Type 2 diabetes mellitus ([place]) 06/24/2010  . 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 HYPOXEMIA 06/24/2010  . SMALL BOWEL OBSTRUCTION, HX OF 06/24/2010    Recommendations/Plan: Discussed again with patient's brother/HCPOA [name] on the phone at [phone].   He reports understanding that he has been given information about the patient having persistent bowel leak with no meaningful surgical options left to be employed. Additionally that she will not benefit from a trach placement either. He is aware that the patient continues to decline, despite all efforts being made at life prolongation measures. She remains intubated, on CRRT.   He is asking about having the patient's daughter come along with him to visit. I have discussed with him about referring this matter to nursing leadership and their most recent visitor restrictions policy.   Discussed frankly but compassionately with him that the patient remains at high risk of declining and dying, in spite of current interventions. Offered that PMT will be ready to assist, if consideration is given to focusing on comfort measures. He is thankful for this information, how ever no further discussion was held.    Code Status:    Code Status Orders  (From admission, onward)         Start     Ordered   11/20/2018 1515  Full code  Continuous     11/29/2018 1515        Code Status History    Date Active Date Inactive Code Status Order ID Comments User Context   12/12/2018 1512 11/20/2018 1515 Full Code  [id number]  Chesley Mires, MD Inpatient   Advance Care Planning Activity       Prognosis:   Poor  Discharge Planning:  To Be Determined  Care plan was discussed with brother, bedside RN Also discussed with PCCM.  Thank you for allowing the Palliative Medicine Team to assist in the care of this patient.   Time In: 10 Time Out: 10.35 Total Time 35 Prolonged Time Billed No      Greater than 50%  of this time was spent counseling and coordinating care related to the above assessment and plan.  Loistine Chance, MD [phone] Please contact Palliative Medicine Team phone at [phone] for questions and concerns.

## 2018-12-22 NOTE — Progress Notes (Signed)
Called brother Carmen Stone) for update given findings on exam / concern for stool leaking from wound.  CCS indicates that there are no other surgical options.  Reviewed with Carmen Stone that given her primary problem can not repaired, would recommend stopping HD and extubating her for comfort.  We discussed a possible small window of time where the family might be able to have meaningful interaction with the patient.  Carmen Stone indicates that they want to continue full code status for now but hopes to come and visit with the patient.  He states he knows that she is not going to get better from this illness and wants as much time with her as he can have.  He states she would want everything done.  Discussed that we were not on any sort of timeline and that she may pass despite the support of mechanical ventilation / HD.  Will ask about visitation for the family given that she will not survive but do not think her death is imminent within 6 hours.     [name], NP-C [facility] Pgr: [phone] or if no answer [phone] 12/22/2018, 9:45 AM

## 2018-12-22 NOTE — Progress Notes (Signed)
NAME:  Carmen Stone, MRN:  [id number], DOB:  [birth year], LOS: 103 ADMISSION DATE:  11/24/2018, CONSULTATION DATE:  11/21/2018 REFERRING MD:  Dr. Ninfa Linden, Surgery, CHIEF COMPLAINT:  Abdominal pain   Brief History   63 y/o female presented to ER 11/24/2018 with nausea, vomiting, abdominal pain.  CT abd/pelvis showed focal perforated loop of SB with free air with multiple complex ventral hernias.  Underwent emergent laparotomy with SB resection, lysis of adhesions and wound vac.  Remained on vent and pressors post op.  Hospital course complicated by respiratory arrest 6/13, multiple reintubations, intermittent vasopressor needs, AKI on CVVHD.     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 Changing volume goal on CRRT to -100 cc an hour. Weaned 7 hours PSV   6/16 Encephalopathic. Hypertensive, adding labetalol.  IV TNA started 6/17 A little more awake.  Tolerating PSV but SBI exceeds 105.  Volume removal with CVVHD 6/18 Neuro status improved.  Required brief vasoactive drips.  Net negative fluid volume status.PSV 6/19 Extubated 6/20 reintubated for altered mentation, head CT negative  6/21 Tx PRBC x1.  Off vasopressors 6/23 Febrile overnight, Blood Cx. Transfuse 1 U PRBC 6/24 Afebrile. Remains off pressors. 100cc/hr removal /c CRRT. 30% FiO2.  6/25 L IJ, subclavian, axillary  + DVT. Heparin initiated. Remains on CRRT.  6/26  CT ABD/pelvis positive for free air and ascites. Back to OR  for ex lap of perforated viscous, resection of previous anastomosis, application of wound vac.  1U RBCs intra op.   6/28 Back on pressors with hgb <7 / sedated on fent drip, PRBC 6/29 Pending OR for possible closure  7/01 Less sedation, following commands, abd open with VAC, on CVVHD (even) 7/03 OR, unable to close but bowel "looks some better", hematoma in abd, on CVVHD 7/05 hemoglobin drop with red drainage from wound vac, heparin gtt held, transfuse PRBC 7/06 laparotomy, washout, SB anastomosis  Consults:  Renal 6/09 AKI, acidosis Cardiology 6/09 Takotsubo CM  Palliative care 6/30 Goals of care  Procedures:  ETT 6/07 >> 6/19, 6/20 >> Rt IJ HD 6/09 >> Rt [place] CVL 6/09 >> Lt radial aline 6/09 >> discontinued  Significant Diagnostic Tests:  CT abd/pelvis 6/07 >> focal perforated loop of SB with free air with multiple complex ventral hernias Echo 6/08 >> EF 20%, Takotsubo CM CT head 6/20 >> negative for any acute findings CT abdomen 6/20 >> bilateral effusions, atelectasis left lower lobe, results noted.    CT Chest 6/20 >> small bilateral pleural effusions with compressive atelectasis, anterior R 4-7 rib fractures CT ABD/Pelvis 6/20 >> small bowel surgical changes, mild circumferential wall thickening of scattered small bowel loops, small to moderate ascites LUE Duplex 6/24 >> DVT L IJ, subclavian, axillary, SVT L cephalic  CT ABD/Pelvis 1/63 >> large volume ascites with intraperitoneal free air   Micro Data:  COVID 6/07 >> negative Tracheal aspirate 6/20 >> E Coli sensitive to zosyn BCx2 6/23 >> negative BCx2  6/26 >> negative   Antimicrobials:  Zosyn 6/07 >> 6/17 Diflucan 6/07 >> 6/10 Cefepime 6/21 >>6/26 Vanco 6/21 >>  6/25 Anidulafungin 6/26 >> 7/06 Zosyn 6/26 >>  Interim history/subjective:  Afebrile. I/O- UOP 160ml, -6.5L on CVVHD / net even for 24h.  RN reports concern for stool leaking from abdominal wound  Objective   Blood pressure (!) 138/49, pulse (!) 122, temperature 98.1 F (36.7 C), temperature source Oral, resp. rate (!) 21, height 5\' 4"  (1.626 m),  weight 64.7 kg, SpO2 100 %.    Vent Mode: PRVC FiO2 (%):  [30 %] 30 % Set Rate:  [18 bmp] 18 bmp Vt Set:  [430 mL] 430 mL PEEP:  [5 cmH20] 5 cmH20 Plateau Pressure:  [18 cmH20-20 cmH20] 20 cmH20   Intake/Output Summary (Last 24 hours) at 12/22/2018 0759 Last data filed at 12/22/2018 0700 Gross per 24 hour  Intake 6577.04 ml  Output 6913 ml  Net -335.96 ml   Filed Weights   12/27/2018 0500 12/22/18 0500  Weight: 67.4 kg 64.7 kg    Examination: General: critically ill appearing female lying in bed on vent HEENT: MM pink/moist, ETT Neuro: eyes closed, no follow commands / sedate, does move spontaneously CV: s1s2 rrr, no m/r/g PULM: even/non-labored, lungs bilaterally clear / vent assisted breaths  QH:UTML, non-tender, bsx4 active  Extremities: warm/dry, no edema  Skin: no rashes or lesions  Resolved Hospital Problem list   Metabolic alkalosis, Abdominal peritonitis with septic shock, Circulatory shock, Cardiorespiratory arrest 6/13, Shock liver, Hypophosphatemia, thrombocytopenia  Assessment & Plan:   Acute hypoxic respiratory failure in setting of septic shock with peritonitis. Failure to wean - failed extubation attempt 3 times. Plan -PSV as tolerated  -not a candidate for extubation given overall condition  -follow CXR intermittently  -would not trach at this point given concerns for re-leak  Small Bowel Perforation s/p Resection, Lysis of Adhesions. - surgery not optimistic her SB anastomosis will hold and she will likely developed leak again; not sure any additional options if this happens. Plan -post-operative care per CCS -will need to hear from CCS regarding prognosis with concerns for re-leak  Acute Metabolic Encephalopathy. Plan -RASS Goal: 0 to -1   Fever -likely related to leak, abd hematoma  -E coli also in sputum from 6/21 Plan -day 12/14 abx  Anemia from blood loss with intraabdominal hematoma. 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 Plan -follow hemodynamics  -PRN hydralazine for SBP > 160  -Consider ECHO to follow up EF if stabilizes   Acute kidney injury from tubular necrosis. -baseline creatinine of 0.73 from 11/15/2018 Plan -CRRT per Nephrology  Type 2 Diabetes. Plan -SSI   Moderate to Severe Protein Calorie Malnutrition.  Plan -TPN  Acute DVT- Left IJ, subclavian, axillary. Plan -heparin on hold due to abdominal hematoma   Hypothyroidism. Plan -IV synthroid   Goals of care. Plan -palliative care following, appreciate input -if another leak of stool, not sure surgery has more to offer. In that event, would consider extubating her to allow her time to spend with her family.    Best practice:  Diet: TPN. DVT prophylaxis: SCDs GI prophylaxis: Protonix Mobility: Bedrest Code Status: Full code Disposition: ICU.    Labs:   CMP Latest Ref Rng & Units 12/22/2018 12/21/2018 12/21/2018  Glucose 70 - 99 mg/dL 186(H) 239(H) 193(H)  BUN 8 - 23 mg/dL 29(H) 32(H) 36(H)  Creatinine 0.44 - 1.00 mg/dL 0.74 0.82 0.91  Sodium 135 - 145 mmol/L 138 138 140  Potassium 3.5 - 5.1 mmol/L  4.4 3.8 3.3(L)  Chloride 98 - 111 mmol/L 88(L) 90(L) 92(L)  CO2 22 - 32 mmol/L 35(H) 35(H) 34(H)  Calcium 8.9 - 10.3 mg/dL 10.4(H) 8.7(L) 7.2(L)  Total Protein 6.5 - 8.1 g/dL - - -  Total Bilirubin 0.3 - 1.2 mg/dL - - -  Alkaline Phos 38 - 126 U/L - - -  AST 15 - 41 U/L - - -  ALT 0 - 44 U/L - - -   CBC Latest Ref Rng & Units 12/22/2018 12/21/2018 12/28/2018  WBC 4.0 - 10.5 K/uL 20.0(H) 20.7(H) 21.9(H)  Hemoglobin 12.0 - 15.0 g/dL 8.1(L) 8.7(L) 9.2(L)  Hematocrit 36.0 - 46.0 % 25.5(L) 26.6(L) 28.2(L)  Platelets 150 - 400 K/uL 232 199 206    CBG (last 3)  Recent Labs    12/21/18 1155 12/22/18 0001 12/22/18 0535  GLUCAP 180* 243* 203*    CC Time: 30 minutes  [name], NP-C [facility] Pgr: [phone] or if  no answer [phone] 12/22/2018, 7:59 AM

## 2018-12-22 NOTE — Progress Notes (Signed)
PHARMACY - ADULT TOTAL PARENTERAL NUTRITION CONSULT NOTE   Pharmacy Consult for TPN Indication: prolonged ileus  HPI: 62 yoF admitted 6/7 with perforated small bowel from closed-loop obstruction and underwent emergent exploratory laparotomy. Returned to OR on 6/9 and again on 6/14 for closure of abdomen.  Patient coded on 12/24/2022 and developed multiorgan failure. Remains intubated & on CRRT. Pressors have been weaned off and she is completing antibiotic course for sepsis/PNA.  NPO since admission on 6/7. Pharmacy consulted to start TPN 6/16. D/t acute liver injury from shock, initiated TPN at low rate & advance slowly as requested by surgery.  Patient Measurements: Height: '5\' 4"'  (162.6 cm) Weight: 142 lb 10.2 oz (64.7 kg) IBW/kg (Calculated) : 54.7 TPN AdjBW (KG): 59.7 Body mass index is 24.48 kg/m. Usual Weight: 90kg  Significant events:  6/18: OK to increase to goal rate per surgery 6/22: CRRT stopped for holiday; CBG 69 overnight, D50 given but TPN continued (55 units insulin in TPN); BS and BM overnight - initiating trickle feeds 6/23: resuming CRRT, return to high-protein TPN; fevers overnight, CCM removing some central lines; will use chemo port for TPN 6/24: began adding lipids to TPN formula 6/25: trickle feeds stopped d/t abd distention 6/26: CRRT clotted off overnight; patient to OR this AM for ex lap; RN attempting to restart CRRT post-op but still having issues at this time 6/27: CRRT appears to have been successfully resumed overnight 6/29 OR pus throughout abdomen. Small hole in small bowel 7/3 OR wash out of abd, replace wound VAC, abd remains open 7/6 sched for OR today for wash out abd, replace wound VAC 7/8 new leak at   Central access: 11/24/2018 TPN start date: 11/30/18  ASSESSMENT                                                                                                          Current Nutrition: NPO IVF: On CRRT to keep net neutral I/O  Today:   Glucose  (goal 100-150) - Hx DM on 70/30 Novolog 20 units BID PTA.  CBGs now elevated again with lipids in TPN (range 167-243)  25 units insulin in TPN + ICU resistant scale; 24 units SSI required yesterday  Electrolytes - on CRRT; K, Mg, Phos all improved following repletion yesterday but Phos still borderline low; Cl remains low, Ca now elevated  Renal - on CRRT, SCr, BUN following; bicarb remains elevated (possibly d/t Cl depletion)  LFTs - albumin remains low; alk phos elevated but improved to near normal (7/6)  TGs - WNL (7/6)  Prealbumin - remains low but improving (7/6)  NUTRITIONAL GOALS  RD recs: (6/29):  Kcal:1493 kcal, Protein:195-215 gram Fluid:>/= 1.8 L/day  PLAN                                                                                                                          NaPhos 15 mmol IV x 1  At 1800 today:   Continue TPN at 75 ml/hr   Custom TPN with lipids at goal rate of 75 ml/hr provides: 198 g/day protein, 1494 Kcal/day = 100% support  Electrolytes: reduce K, remove Ca, others unchanged; Cl:Ac = max Cl  TPN to contain standard MVI daily, trace elements MWF only due to national backorder.  IVF per MD - CRRT fluids  Continue ICU resistant-scale glycemic control orders q6h; increase to 40 units regular insulin in TPN   TPN lab panels Mondays & Thursdays  Ordered BID renal function panels while on CRRT  Reuel Boom, PharmD, BCPS [phone] 12/22/2018, 8:37 AM

## 2018-12-22 NOTE — Progress Notes (Signed)
2 Days Post-Op   Subjective/Chief Complaint: Succus began leaking from her midline wound overnight per RN. No vasopressors currently. Remains on CRRT  Objective: Vital signs in last 24 hours: Temp:  [98.1 F (36.7 C)-98.7 F (37.1 C)] 98.1 F (36.7 C) (07/08 0400) Pulse Rate:  [94-122] 122 (07/08 0730) Resp:  [16-28] 21 (07/08 0730) BP: (85-157)/(36-74) 138/49 (07/08 0730) SpO2:  [97 %-100 %] 100 % (07/08 0730) FiO2 (%):  [30 %] 30 % (07/08 0600) Weight:  [64.7 kg] 64.7 kg (07/08 0500) Last BM Date: 12/12/18  Intake/Output from previous day: 07/07 0701 - 07/08 0700 In: 6587 [I.V.:5827.1; IV Piggyback:760] Out: 0539 [Urine:127; Emesis/NG output:250] Intake/Output this shift: Total I/O In: 92.3 [I.V.:92.3] Out: -   Exam: On Vent Abdomen soft,  midline wound leaking succus from superior aspect; moist kerlex replaced  NG somewhat bilious   Lab Results:  Recent Labs    12/21/18 0422 12/22/18 0415  WBC 20.7* 20.0*  HGB 8.7* 8.1*  HCT 26.6* 25.5*  PLT 199 232   BMET Recent Labs    12/21/18 1607 12/22/18 0415  NA 138 138  K 3.8 4.4  CL 90* 88*  CO2 35* 35*  GLUCOSE 239* 186*  BUN 32* 29*  CREATININE 0.82 0.74  CALCIUM 8.7* 10.4*   PT/INR No results for input(s): LABPROT, INR in the last 72 hours. ABG No results for input(s): PHART, HCO3 in the last 72 hours.  Invalid input(s): PCO2, PO2  Studies/Results: Dg Abd Portable 1v  Result Date: 12/21/2018 CLINICAL DATA:  Evaluate OG placement EXAM: PORTABLE ABDOMEN - 1 VIEW COMPARISON:  KUB radiograph 12/25/2018 chest radiograph 12/08/2018 FINDINGS: Transesophageal tube tip and side port are distal to the GE junction, tip terminating near the gastric antrum. Endotracheal tube terminates in the midtrachea. Left port catheter tip near the superior cavoatrial junction. Right subclavian venous catheter terminating near the superior cavoatrial junction. Right internal jugular catheter terminating in the lower SVC. No  consolidative opacity in the visualized lungs. Enteric high-density material seen within the bowels. Visualized bowel gas pattern is unremarkable. Surgical clips noted in the right upper quadrant. IMPRESSION: Transesophageal tube tip near the gastric antrum, side port within the gastric body. Appropriate positioning of remaining visualized lines and tubes. Electronically Signed   By: MD Lovena Le   On: 12/21/2018 20:56    Anti-infectives: Anti-infectives (From admission, onward)   Start     Dose/Rate Route Frequency Ordered Stop   12/09/2018 1030  clindamycin (CLEOCIN) 900 mg, gentamicin (GARAMYCIN) 240 mg in sodium chloride 0.9 % 1,000 mL for intraperitoneal lavage      Irrigation To Surgery 11/17/2018 1026 12/11/2018 1042   12/11/18 1500  anidulafungin (ERAXIS) 100 mg in sodium chloride 0.9 % 100 mL IVPB  Status:  Discontinued     100 mg 78 mL/hr over 100 Minutes Intravenous Every 24 hours 12/08/2018 1007 12/15/2018 1103   12/11/18 0600  piperacillin-tazobactam (ZOSYN) IVPB 3.375 g     3.375 g 100 mL/hr over 30 Minutes Intravenous Every 6 hours 12/11/18 0317     12/03/2018 1800  piperacillin-tazobactam (ZOSYN) IVPB 2.25 g  Status:  Discontinued     2.25 g 100 mL/hr over 30 Minutes Intravenous Every 6 hours 12/03/2018 1413 12/11/18 0317   12/01/2018 1100  anidulafungin (ERAXIS) 200 mg in sodium chloride 0.9 % 200 mL IVPB     200 mg 78 mL/hr over 200 Minutes Intravenous  Once 11/22/2018 1007 11/29/2018 1456   11/19/2018 1100  piperacillin-tazobactam (ZOSYN) IVPB 3.375  g     3.375 g 100 mL/hr over 30 Minutes Intravenous  Once 11/17/2018 1027 12/02/2018 1245   12/08/18 1800  vancomycin (VANCOCIN) IVPB 750 mg/150 ml premix  Status:  Discontinued     750 mg 150 mL/hr over 60 Minutes Intravenous Every 48 hours 12/07/18 0822 12/07/18 0903   12/08/18 1000  ceFEPIme (MAXIPIME) 2 g in sodium chloride 0.9 % 100 mL IVPB  Status:  Discontinued     2 g 200 mL/hr over 30 Minutes Intravenous Every 24 hours 12/07/18 0822  12/07/18 0903   12/07/18 2200  ceFEPIme (MAXIPIME) 2 g in sodium chloride 0.9 % 100 mL IVPB  Status:  Discontinued     2 g 200 mL/hr over 30 Minutes Intravenous Every 12 hours 12/07/18 0903 12/12/2018 1027   12/07/18 1800  vancomycin (VANCOCIN) IVPB 750 mg/150 ml premix  Status:  Discontinued     750 mg 150 mL/hr over 60 Minutes Intravenous Every 24 hours 12/07/18 0903 12/09/18 1040   12/06/18 1800  vancomycin (VANCOCIN) IVPB 750 mg/150 ml premix  Status:  Discontinued     750 mg 150 mL/hr over 60 Minutes Intravenous Every 24 hours 12/05/18 1652 12/07/18 0822   12/05/18 1800  ceFEPIme (MAXIPIME) 2 g in sodium chloride 0.9 % 100 mL IVPB  Status:  Discontinued     2 g 200 mL/hr over 30 Minutes Intravenous Every 12 hours 12/05/18 1644 12/07/18 0822   12/05/18 1700  vancomycin (VANCOCIN) 1,250 mg in sodium chloride 0.9 % 250 mL IVPB     1,250 mg 166.7 mL/hr over 90 Minutes Intravenous  Once 12/05/18 1652 12/05/18 2019   11/17/2018 1800  fluconazole (DIFLUCAN) IVPB 200 mg  Status:  Discontinued     200 mg 100 mL/hr over 60 Minutes Intravenous Every 24 hours 11/29/2018 0748 12/06/2018 0847   12/01/2018 1800  fluconazole (DIFLUCAN) IVPB 400 mg  Status:  Discontinued     400 mg 100 mL/hr over 120 Minutes Intravenous Every 24 hours 11/19/2018 0847 12/06/2018 0856   11/26/2018 1400  piperacillin-tazobactam (ZOSYN) IVPB 2.25 g  Status:  Discontinued     2.25 g 100 mL/hr over 30 Minutes Intravenous Every 6 hours 12/04/2018 0748 12/01/18 1104   11/15/2018 0600  cefoTEtan (CEFOTAN) 2 g in sodium chloride 0.9 % 100 mL IVPB     2 g 200 mL/hr over 30 Minutes Intravenous On call to O.R. 11/22/18 1300 12/06/2018 0624   11/22/18 1800  fluconazole (DIFLUCAN) IVPB 400 mg  Status:  Discontinued     400 mg 100 mL/hr over 120 Minutes Intravenous Every 24 hours 11/30/2018 1528 12/06/2018 0748   11/22/18 1800  vancomycin (VANCOCIN) 1,500 mg in sodium chloride 0.9 % 500 mL IVPB  Status:  Discontinued     1,500 mg 250 mL/hr over 120  Minutes Intravenous Every 24 hours 11/22/18 0358 11/22/18 0750   11/22/18 0115  vancomycin (VANCOCIN) 1,500 mg in sodium chloride 0.9 % 500 mL IVPB     1,500 mg 250 mL/hr over 120 Minutes Intravenous  Once 11/22/18 0101 11/22/18 0319   12/07/2018 1630  fluconazole (DIFLUCAN) IVPB 800 mg     800 mg 100 mL/hr over 240 Minutes Intravenous  Once 11/26/2018 1527 11/16/2018 2013   11/24/2018 1600  piperacillin-tazobactam (ZOSYN) IVPB 3.375 g  Status:  Discontinued     3.375 g 12.5 mL/hr over 240 Minutes Intravenous Every 8 hours 12/14/2018 1522 12/12/2018 0748   12/14/2018 0545  piperacillin-tazobactam (ZOSYN) IVPB 3.375 g  3.375 g 100 mL/hr over 30 Minutes Intravenous  Once 12/01/2018 0530 12/01/2018 0626   11/20/2018 0530  piperacillin-tazobactam (ZOSYN) IVPB 4.5 g  Status:  Discontinued     4.5 g 200 mL/hr over 30 Minutes Intravenous  Once 12/08/2018 0517 11/20/2018 0529      Assessment/Plan: s/p Procedure(s): EXPLORATORY LAPAROTOMY WITH WOUND WASHOUT AND SMALL BOWEL ANASTOMOSIS - 12/25/2018  -Remains critically ill - now with leak. I have discussed everything with her brother Iona Beard again today and these new findings. We discussed that at this point we have exhausted our surgical options for her - she was not a candidate for any diversion/stoma 2d ago due to tethering; additionally she has ~6 inches of small bowel proximal to her anastomosis. We discussed palliative options moving forward and potential for changing code status. He would like additional time to contemplate everything. I think palliative care weighing in again at this point would be quite helpful with family discussions -Continue IV nutrition; NG to low intermittent wall suction -WOCN for Eakin pouch application to assist in control -Appreciate CCMs assistance in her care   LOS: 31 days   Ileana Roup 12/22/2018

## 2018-12-22 NOTE — Progress Notes (Signed)
[facility] ROUNDING NOTE   Subjective:   This is a 63 year old lady with acute kidney injury last baseline 0.79 06/04/2018.  She had acute oliguric acute tubular necrosis in the setting of septic shock from bowel perforation.  Was on CRRT from 6/ 9  to  6/12 was held after cardiac arrest.  She resumed 11/30/2018 after abdominal closure.  Still no signs of renal recovery.  Status post small bowel perforation due to closed-loop SBO and septic shock followed by CCM underwent closure 11/22/2018 new recent bowel leak due to nonhealing anastomosis.  Went back to the OR 12/07/2018 for resection of anastomosis.  She went back to the OR 12/12/2018.  She has a large wound VAC.  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  No history of diabetes mellitus type 2.   Has been kept even on CRRT.  Ca++ up today 10.4, corr Ca++ = 12..4  IV heparin IV TPN IV Zosyn 3.375 g Protonix 40 mg IV every 24 hours  Chest x-ray 7/6 revealed clear lungs except for mild atelectasis right base   Objective:  Vital signs in last 24 hours:  Temp:  [98.1 F (36.7 C)-98.9 F (37.2 C)] 98.9 F (37.2 C) (07/08 1200) Pulse Rate:  [95-122] 111 (07/08 1400) Resp:  [16-28] 18 (07/08 1400) BP: (85-165)/(36-74) 107/46 (07/08 1400) SpO2:  [100 %] 100 % (07/08 1400) FiO2 (%):  [30 %] 30 % (07/08 1347) Weight:  [64.7 kg] 64.7 kg (07/08 0500)  Weight change:  Filed Weights   01/02/2019 0500 12/22/18 0500  Weight: 67.4 kg 64.7 kg    Intake/Output: I/O last 3 completed shifts: In: 8933 [I.V.:8065.8; IV Piggyback:867.1] Out: 5681 [Urine:200; Emesis/NG output:250; Other:8849]   Intake/Output this shift:  Total I/O In: 1873.6 [I.V.:1659.5; IV Piggyback:214.1] Out: 1911 [Urine:45; Emesis/NG output:100; Other:1766]  Exam: Intubated and sedated CVS- RRR no JVP RS- CTA ant and lat ABD- BS present soft non-distended EXT-1-2+ lower extremity edema   Assessment/ Plan:   Acute  kidney injury/ ATN: with very little signs of recovery.  She has required CRRT since 11/30/2018.  Baseline serum creatinine 0.9.  Acute kidney injury following perforated small bowel with fecal peritonitis and septic shock. Getting citrate for anticoagulation of the circuit due to frequent clotting. Keeping even.   Hypotension/volume: no vol excess on exam, well under dry wt. Getting TNA w/ volume load. Keeping even w/ CRRT.   Anemia - transfuse prn  Perforated small bowel - multiple surgeries w/ significant SB resection.  Diabetes mellitus per primary team  Nutrition continues on TNA  Hypophosphatemia: resolved   Prognosis: poor prognosis, pall care working w/ family    Kelly Splinter, MD 12/22/2018, 2:40 PM     Basic Metabolic Panel: Recent Labs  Lab 12/18/18 0500  01/06/2019 0500  12/29/2018 0345 01/04/2019 1600 12/21/18 0422 12/21/18 1607 12/22/18 0415  NA 136   < > 135   < > 140  139 140 140 138 138  K 4.3   < > 4.3   < > 3.6  3.6 3.4* 3.3* 3.8 4.4  CL 103   < > 105   < > 92*  91* 92* 92* 90* 88*  CO2 26   < > 24   < > 36*  32 33* 34* 35* 35*  GLUCOSE 97   < > 147*   < > 145*  146* 291* 193* 239* 186*  BUN 45*   < > 45*   < > 41*  41* 40* 36* 32* 29*  CREATININE 1.01*   < > 0.94   < > 0.96  1.00 0.93 0.91 0.82 0.74  CALCIUM 7.5*   < > 7.1*   < > 9.5  9.4 8.8* 7.2* 8.7* 10.4*  MG 2.2  --  2.2  --  1.9  --  1.6*  --  2.0  PHOS 2.4*   < > 2.4*   < > 2.4*  2.4* 2.5 2.0* 2.9 2.4*   < > = values in this interval not displayed.    Liver Function Tests: Recent Labs  Lab 12/16/18 0600  01/01/2019 0345 12/18/2018 1600 12/21/18 0422 12/21/18 1607 12/22/18 0415  AST 24  --  25  --   --   --   --   ALT 22  --  19  --   --   --   --   ALKPHOS 225*  --  139*  --   --   --   --   BILITOT 0.4  --  0.4  --   --   --   --   PROT 6.0*  --  4.9*  --   --   --   --   ALBUMIN 1.4*   < > 1.3*  1.4* 1.3* 1.3* 1.3* 1.2*   < > = values in this interval not displayed.   No results for  input(s): LIPASE, AMYLASE in the last 168 hours. No results for input(s): AMMONIA in the last 168 hours.  CBC: Recent Labs  Lab 01/13/2019 0500 01/02/2019 1700 01/11/2019 2009 12/29/2018 0345 12/21/18 0422 12/22/18 0415  WBC 33.0* 19.1*  --  21.9* 20.7* 20.0*  NEUTROABS  --   --   --  16.9*  --   --   HGB 6.0* 9.3* 9.6* 9.2* 8.7* 8.1*  HCT 18.4* 28.5* 29.3* 28.2* 26.6* 25.5*  MCV 95.3 91.6  --  90.7 95.3 97.0  PLT 177 170  --  206 199 232     Medications:   .  prismasol BGK 4/2.5 400 mL/hr at 12/22/18 1310  . sodium chloride Stopped (12/22/18 1231)  . calcium gluconate infusion for CRRT 20 g (12/22/18 1406)  . fentaNYL infusion INTRAVENOUS 50 mcg/hr (12/22/18 1357)  . piperacillin-tazobactam (ZOSYN)  IV Stopped (12/22/18 1301)  . prismasol B22GK 4/0 1,700 mL/hr at 12/22/18 1206  . sodium citrate 2 %/dextrose 2.5% solution 3000 mL 510 mL/hr at 12/22/18 0929  . sodium phosphate  Dextrose 5% IVPB 43 mL/hr at 12/22/18 1300  . TPN ADULT (ION) 75 mL/hr at 12/22/18 1300  . TPN ADULT (ION)     . chlorhexidine gluconate (MEDLINE KIT)  15 mL Mouth Rinse BID  . Chlorhexidine Gluconate Cloth  6 each Topical Daily  . dorzolamide-timolol  1 drop Both Eyes BID  . insulin aspart  3-9 Units Subcutaneous Q6H  . levothyroxine  12.5 mcg Intravenous Daily  . mouth rinse  15 mL Mouth Rinse 10 times per day  . pantoprazole (PROTONIX) IV  40 mg Intravenous Q24H   sodium chloride, albuterol, alteplase, fentaNYL (SUBLIMAZE) injection, heparin, hydrALAZINE, metoprolol tartrate, sodium chloride, sodium chloride flush

## 2018-12-23 DIAGNOSIS — K9189 Other postprocedural complications and disorders of digestive system: Secondary | ICD-10-CM

## 2018-12-23 LAB — POCT I-STAT, CHEM 8
BUN: 26 mg/dL — ABNORMAL HIGH (ref 8–23)
BUN: 27 mg/dL — ABNORMAL HIGH (ref 8–23)
BUN: 28 mg/dL — ABNORMAL HIGH (ref 8–23)
BUN: 30 mg/dL — ABNORMAL HIGH (ref 8–23)
BUN: 27 mg/dL — ABNORMAL HIGH (ref 8–23)
BUN: 29 mg/dL — ABNORMAL HIGH (ref 8–23)
BUN: 26 mg/dL — ABNORMAL HIGH (ref 8–23)
BUN: 29 mg/dL — ABNORMAL HIGH (ref 8–23)
BUN: 26 mg/dL — ABNORMAL HIGH (ref 8–23)
BUN: 29 mg/dL — ABNORMAL HIGH (ref 8–23)
BUN: 25 mg/dL — ABNORMAL HIGH (ref 8–23)
BUN: 27 mg/dL — ABNORMAL HIGH (ref 8–23)
BUN: 29 mg/dL — ABNORMAL HIGH (ref 8–23)
BUN: 31 mg/dL — ABNORMAL HIGH (ref 8–23)
BUN: 25 mg/dL — ABNORMAL HIGH (ref 8–23)
BUN: 29 mg/dL — ABNORMAL HIGH (ref 8–23)
BUN: 25 mg/dL — ABNORMAL HIGH (ref 8–23)
BUN: 28 mg/dL — ABNORMAL HIGH (ref 8–23)
BUN: 26 mg/dL — ABNORMAL HIGH (ref 8–23)
BUN: 28 mg/dL — ABNORMAL HIGH (ref 8–23)
Calcium, Ion: 0.6 mmol/L — CL (ref 1.15–1.40)
Calcium, Ion: 0.64 mmol/L — CL (ref 1.15–1.40)
Calcium, Ion: 1.23 mmol/L (ref 1.15–1.40)
Calcium, Ion: 1.25 mmol/L (ref 1.15–1.40)
Calcium, Ion: 0.67 mmol/L — CL (ref 1.15–1.40)
Calcium, Ion: 1.21 mmol/L (ref 1.15–1.40)
Calcium, Ion: 0.66 mmol/L — CL (ref 1.15–1.40)
Calcium, Ion: 1.28 mmol/L (ref 1.15–1.40)
Calcium, Ion: 0.55 mmol/L — CL (ref 1.15–1.40)
Calcium, Ion: 1.05 mmol/L — ABNORMAL LOW (ref 1.15–1.40)
Calcium, Ion: 0.56 mmol/L — CL (ref 1.15–1.40)
Calcium, Ion: 0.65 mmol/L — CL (ref 1.15–1.40)
Calcium, Ion: 0.97 mmol/L — ABNORMAL LOW (ref 1.15–1.40)
Calcium, Ion: 1.35 mmol/L (ref 1.15–1.40)
Calcium, Ion: 0.51 mmol/L — CL (ref 1.15–1.40)
Calcium, Ion: 1.09 mmol/L — ABNORMAL LOW (ref 1.15–1.40)
Calcium, Ion: 0.46 mmol/L — CL (ref 1.15–1.40)
Calcium, Ion: 0.96 mmol/L — ABNORMAL LOW (ref 1.15–1.40)
Calcium, Ion: 0.45 mmol/L — CL (ref 1.15–1.40)
Calcium, Ion: 1 mmol/L — ABNORMAL LOW (ref 1.15–1.40)
Chloride: 79 mmol/L — ABNORMAL LOW (ref 98–111)
Chloride: 80 mmol/L — ABNORMAL LOW (ref 98–111)
Chloride: 81 mmol/L — ABNORMAL LOW (ref 98–111)
Chloride: 84 mmol/L — ABNORMAL LOW (ref 98–111)
Chloride: 78 mmol/L — ABNORMAL LOW (ref 98–111)
Chloride: 78 mmol/L — ABNORMAL LOW (ref 98–111)
Chloride: 77 mmol/L — ABNORMAL LOW (ref 98–111)
Chloride: 78 mmol/L — ABNORMAL LOW (ref 98–111)
Chloride: 76 mmol/L — ABNORMAL LOW (ref 98–111)
Chloride: 77 mmol/L — ABNORMAL LOW (ref 98–111)
Chloride: 77 mmol/L — ABNORMAL LOW (ref 98–111)
Chloride: 77 mmol/L — ABNORMAL LOW (ref 98–111)
Chloride: 79 mmol/L — ABNORMAL LOW (ref 98–111)
Chloride: 80 mmol/L — ABNORMAL LOW (ref 98–111)
Chloride: 79 mmol/L — ABNORMAL LOW (ref 98–111)
Chloride: 79 mmol/L — ABNORMAL LOW (ref 98–111)
Chloride: 77 mmol/L — ABNORMAL LOW (ref 98–111)
Chloride: 77 mmol/L — ABNORMAL LOW (ref 98–111)
Chloride: 78 mmol/L — ABNORMAL LOW (ref 98–111)
Chloride: 78 mmol/L — ABNORMAL LOW (ref 98–111)
Creatinine, Ser: 0.8 mg/dL (ref 0.44–1.00)
Creatinine, Ser: 0.8 mg/dL (ref 0.44–1.00)
Creatinine, Ser: 0.8 mg/dL (ref 0.44–1.00)
Creatinine, Ser: 0.9 mg/dL (ref 0.44–1.00)
Creatinine, Ser: 0.8 mg/dL (ref 0.44–1.00)
Creatinine, Ser: 0.9 mg/dL (ref 0.44–1.00)
Creatinine, Ser: 0.8 mg/dL (ref 0.44–1.00)
Creatinine, Ser: 0.9 mg/dL (ref 0.44–1.00)
Creatinine, Ser: 0.7 mg/dL (ref 0.44–1.00)
Creatinine, Ser: 0.9 mg/dL (ref 0.44–1.00)
Creatinine, Ser: 0.8 mg/dL (ref 0.44–1.00)
Creatinine, Ser: 0.8 mg/dL (ref 0.44–1.00)
Creatinine, Ser: 0.9 mg/dL (ref 0.44–1.00)
Creatinine, Ser: 1 mg/dL (ref 0.44–1.00)
Creatinine, Ser: 0.7 mg/dL (ref 0.44–1.00)
Creatinine, Ser: 0.9 mg/dL (ref 0.44–1.00)
Creatinine, Ser: 0.7 mg/dL (ref 0.44–1.00)
Creatinine, Ser: 0.8 mg/dL (ref 0.44–1.00)
Creatinine, Ser: 0.7 mg/dL (ref 0.44–1.00)
Creatinine, Ser: 0.9 mg/dL (ref 0.44–1.00)
Glucose, Bld: 165 mg/dL — ABNORMAL HIGH (ref 70–99)
Glucose, Bld: 167 mg/dL — ABNORMAL HIGH (ref 70–99)
Glucose, Bld: 209 mg/dL — ABNORMAL HIGH (ref 70–99)
Glucose, Bld: 226 mg/dL — ABNORMAL HIGH (ref 70–99)
Glucose, Bld: 154 mg/dL — ABNORMAL HIGH (ref 70–99)
Glucose, Bld: 199 mg/dL — ABNORMAL HIGH (ref 70–99)
Glucose, Bld: 202 mg/dL — ABNORMAL HIGH (ref 70–99)
Glucose, Bld: 240 mg/dL — ABNORMAL HIGH (ref 70–99)
Glucose, Bld: 181 mg/dL — ABNORMAL HIGH (ref 70–99)
Glucose, Bld: 223 mg/dL — ABNORMAL HIGH (ref 70–99)
Glucose, Bld: 174 mg/dL — ABNORMAL HIGH (ref 70–99)
Glucose, Bld: 191 mg/dL — ABNORMAL HIGH (ref 70–99)
Glucose, Bld: 221 mg/dL — ABNORMAL HIGH (ref 70–99)
Glucose, Bld: 244 mg/dL — ABNORMAL HIGH (ref 70–99)
Glucose, Bld: 189 mg/dL — ABNORMAL HIGH (ref 70–99)
Glucose, Bld: 242 mg/dL — ABNORMAL HIGH (ref 70–99)
Glucose, Bld: 180 mg/dL — ABNORMAL HIGH (ref 70–99)
Glucose, Bld: 226 mg/dL — ABNORMAL HIGH (ref 70–99)
Glucose, Bld: 187 mg/dL — ABNORMAL HIGH (ref 70–99)
Glucose, Bld: 246 mg/dL — ABNORMAL HIGH (ref 70–99)
HCT: 23 % — ABNORMAL LOW (ref 36.0–46.0)
HCT: 23 % — ABNORMAL LOW (ref 36.0–46.0)
HCT: 24 % — ABNORMAL LOW (ref 36.0–46.0)
HCT: 25 % — ABNORMAL LOW (ref 36.0–46.0)
HCT: 22 % — ABNORMAL LOW (ref 36.0–46.0)
HCT: 26 % — ABNORMAL LOW (ref 36.0–46.0)
HCT: 22 % — ABNORMAL LOW (ref 36.0–46.0)
HCT: 24 % — ABNORMAL LOW (ref 36.0–46.0)
HCT: 24 % — ABNORMAL LOW (ref 36.0–46.0)
HCT: 25 % — ABNORMAL LOW (ref 36.0–46.0)
HCT: 21 % — ABNORMAL LOW (ref 36.0–46.0)
HCT: 22 % — ABNORMAL LOW (ref 36.0–46.0)
HCT: 23 % — ABNORMAL LOW (ref 36.0–46.0)
HCT: 24 % — ABNORMAL LOW (ref 36.0–46.0)
HCT: 22 % — ABNORMAL LOW (ref 36.0–46.0)
HCT: 25 % — ABNORMAL LOW (ref 36.0–46.0)
HCT: 23 % — ABNORMAL LOW (ref 36.0–46.0)
HCT: 25 % — ABNORMAL LOW (ref 36.0–46.0)
HCT: 24 % — ABNORMAL LOW (ref 36.0–46.0)
HCT: 25 % — ABNORMAL LOW (ref 36.0–46.0)
Hemoglobin: 7.8 g/dL — ABNORMAL LOW (ref 12.0–15.0)
Hemoglobin: 7.8 g/dL — ABNORMAL LOW (ref 12.0–15.0)
Hemoglobin: 8.2 g/dL — ABNORMAL LOW (ref 12.0–15.0)
Hemoglobin: 8.5 g/dL — ABNORMAL LOW (ref 12.0–15.0)
Hemoglobin: 7.5 g/dL — ABNORMAL LOW (ref 12.0–15.0)
Hemoglobin: 8.8 g/dL — ABNORMAL LOW (ref 12.0–15.0)
Hemoglobin: 7.5 g/dL — ABNORMAL LOW (ref 12.0–15.0)
Hemoglobin: 8.2 g/dL — ABNORMAL LOW (ref 12.0–15.0)
Hemoglobin: 8.2 g/dL — ABNORMAL LOW (ref 12.0–15.0)
Hemoglobin: 8.5 g/dL — ABNORMAL LOW (ref 12.0–15.0)
Hemoglobin: 7.1 g/dL — ABNORMAL LOW (ref 12.0–15.0)
Hemoglobin: 7.5 g/dL — ABNORMAL LOW (ref 12.0–15.0)
Hemoglobin: 7.8 g/dL — ABNORMAL LOW (ref 12.0–15.0)
Hemoglobin: 8.2 g/dL — ABNORMAL LOW (ref 12.0–15.0)
Hemoglobin: 7.5 g/dL — ABNORMAL LOW (ref 12.0–15.0)
Hemoglobin: 8.5 g/dL — ABNORMAL LOW (ref 12.0–15.0)
Hemoglobin: 7.8 g/dL — ABNORMAL LOW (ref 12.0–15.0)
Hemoglobin: 8.5 g/dL — ABNORMAL LOW (ref 12.0–15.0)
Hemoglobin: 8.2 g/dL — ABNORMAL LOW (ref 12.0–15.0)
Hemoglobin: 8.5 g/dL — ABNORMAL LOW (ref 12.0–15.0)
Potassium: 3.1 mmol/L — ABNORMAL LOW (ref 3.5–5.1)
Potassium: 3.3 mmol/L — ABNORMAL LOW (ref 3.5–5.1)
Potassium: 3.4 mmol/L — ABNORMAL LOW (ref 3.5–5.1)
Potassium: 3.5 mmol/L (ref 3.5–5.1)
Potassium: 3.3 mmol/L — ABNORMAL LOW (ref 3.5–5.1)
Potassium: 3.4 mmol/L — ABNORMAL LOW (ref 3.5–5.1)
Potassium: 3.2 mmol/L — ABNORMAL LOW (ref 3.5–5.1)
Potassium: 3.3 mmol/L — ABNORMAL LOW (ref 3.5–5.1)
Potassium: 3.2 mmol/L — ABNORMAL LOW (ref 3.5–5.1)
Potassium: 3.2 mmol/L — ABNORMAL LOW (ref 3.5–5.1)
Potassium: 3.1 mmol/L — ABNORMAL LOW (ref 3.5–5.1)
Potassium: 3.2 mmol/L — ABNORMAL LOW (ref 3.5–5.1)
Potassium: 3.5 mmol/L (ref 3.5–5.1)
Potassium: 3.6 mmol/L (ref 3.5–5.1)
Potassium: 3.1 mmol/L — ABNORMAL LOW (ref 3.5–5.1)
Potassium: 3.2 mmol/L — ABNORMAL LOW (ref 3.5–5.1)
Potassium: 3.2 mmol/L — ABNORMAL LOW (ref 3.5–5.1)
Potassium: 3.3 mmol/L — ABNORMAL LOW (ref 3.5–5.1)
Potassium: 3.2 mmol/L — ABNORMAL LOW (ref 3.5–5.1)
Potassium: 3.3 mmol/L — ABNORMAL LOW (ref 3.5–5.1)
Sodium: 136 mmol/L (ref 135–145)
Sodium: 137 mmol/L (ref 135–145)
Sodium: 137 mmol/L (ref 135–145)
Sodium: 137 mmol/L (ref 135–145)
Sodium: 135 mmol/L (ref 135–145)
Sodium: 137 mmol/L (ref 135–145)
Sodium: 135 mmol/L (ref 135–145)
Sodium: 137 mmol/L (ref 135–145)
Sodium: 136 mmol/L (ref 135–145)
Sodium: 137 mmol/L (ref 135–145)
Sodium: 136 mmol/L (ref 135–145)
Sodium: 136 mmol/L (ref 135–145)
Sodium: 136 mmol/L (ref 135–145)
Sodium: 137 mmol/L (ref 135–145)
Sodium: 135 mmol/L (ref 135–145)
Sodium: 136 mmol/L (ref 135–145)
Sodium: 137 mmol/L (ref 135–145)
Sodium: 138 mmol/L (ref 135–145)
Sodium: 138 mmol/L (ref 135–145)
Sodium: 138 mmol/L (ref 135–145)
TCO2: 40 mmol/L — ABNORMAL HIGH (ref 22–32)
TCO2: 42 mmol/L — ABNORMAL HIGH (ref 22–32)
TCO2: 42 mmol/L — ABNORMAL HIGH (ref 22–32)
TCO2: 44 mmol/L — ABNORMAL HIGH (ref 22–32)
TCO2: 43 mmol/L — ABNORMAL HIGH (ref 22–32)
TCO2: 49 mmol/L — ABNORMAL HIGH (ref 22–32)
TCO2: 44 mmol/L — ABNORMAL HIGH (ref 22–32)
TCO2: 49 mmol/L — ABNORMAL HIGH (ref 22–32)
TCO2: 44 mmol/L — ABNORMAL HIGH (ref 22–32)
TCO2: 50 mmol/L — ABNORMAL HIGH (ref 22–32)
TCO2: 43 mmol/L — ABNORMAL HIGH (ref 22–32)
TCO2: 46 mmol/L — ABNORMAL HIGH (ref 22–32)
TCO2: 48 mmol/L — ABNORMAL HIGH (ref 22–32)
TCO2: 49 mmol/L — ABNORMAL HIGH (ref 22–32)
TCO2: 42 mmol/L — ABNORMAL HIGH (ref 22–32)
TCO2: 48 mmol/L — ABNORMAL HIGH (ref 22–32)
TCO2: 46 mmol/L — ABNORMAL HIGH (ref 22–32)
TCO2: 48 mmol/L — ABNORMAL HIGH (ref 22–32)
TCO2: 43 mmol/L — ABNORMAL HIGH (ref 22–32)
TCO2: 48 mmol/L — ABNORMAL HIGH (ref 22–32)

## 2018-12-23 LAB — RENAL FUNCTION PANEL
Albumin: 1.2 g/dL — ABNORMAL LOW (ref 3.5–5.0)
Albumin: 1.2 g/dL — ABNORMAL LOW (ref 3.5–5.0)
Anion gap: 18 — ABNORMAL HIGH (ref 5–15)
Anion gap: 15 (ref 5–15)
BUN: 33 mg/dL — ABNORMAL HIGH (ref 8–23)
BUN: 34 mg/dL — ABNORMAL HIGH (ref 8–23)
CO2: 41 mmol/L — ABNORMAL HIGH (ref 22–32)
CO2: 46 mmol/L — ABNORMAL HIGH (ref 22–32)
Calcium: 12.3 mg/dL — ABNORMAL HIGH (ref 8.9–10.3)
Calcium: 10.7 mg/dL — ABNORMAL HIGH (ref 8.9–10.3)
Chloride: 79 mmol/L — ABNORMAL LOW (ref 98–111)
Chloride: 80 mmol/L — ABNORMAL LOW (ref 98–111)
Creatinine, Ser: 0.87 mg/dL (ref 0.44–1.00)
Creatinine, Ser: 0.91 mg/dL (ref 0.44–1.00)
GFR calc Af Amer: >60 mL/min (ref >60)
GFR calc Af Amer: >60 mL/min (ref >60)
GFR calc non Af Amer: >60 mL/min (ref >60)
GFR calc non Af Amer: >60 mL/min (ref >60)
Glucose, Bld: 187 mg/dL — ABNORMAL HIGH (ref 70–99)
Glucose, Bld: 160 mg/dL — ABNORMAL HIGH (ref 70–99)
Phosphorus: 2.5 mg/dL (ref 2.5–4.6)
Phosphorus: 1.8 mg/dL — ABNORMAL LOW (ref 2.5–4.6)
Potassium: 3.4 mmol/L — ABNORMAL LOW (ref 3.5–5.1)
Potassium: 3.3 mmol/L — ABNORMAL LOW (ref 3.5–5.1)
Sodium: 138 mmol/L (ref 135–145)
Sodium: 141 mmol/L (ref 135–145)

## 2018-12-23 LAB — CBC
HCT: 24 % — ABNORMAL LOW (ref 36.0–46.0)
Hemoglobin: 7.5 g/dL — ABNORMAL LOW (ref 12.0–15.0)
MCH: 30.4 pg (ref 26.0–34.0)
MCHC: 31.3 g/dL (ref 30.0–36.0)
MCV: 97.2 fL (ref 80.0–100.0)
Platelets: 252 10*3/uL (ref 150–400)
RBC: 2.47 MIL/uL — ABNORMAL LOW (ref 3.87–5.11)
RDW: 15.1 % (ref 11.5–15.5)
WBC: 19.6 10*3/uL — ABNORMAL HIGH (ref 4.0–10.5)
nRBC: 0 % (ref 0.0–0.2)

## 2018-12-23 LAB — HEPATIC FUNCTION PANEL
ALT: 16 U/L (ref 0–44)
AST: 20 U/L (ref 15–41)
Albumin: 1.2 g/dL — ABNORMAL LOW (ref 3.5–5.0)
Alkaline Phosphatase: 178 U/L — ABNORMAL HIGH (ref 38–126)
Bilirubin, Direct: 0.2 mg/dL (ref 0.0–0.2)
Indirect Bilirubin: 0.3 mg/dL (ref 0.3–0.9)
Total Bilirubin: 0.5 mg/dL (ref 0.3–1.2)
Total Protein: 5.6 g/dL — ABNORMAL LOW (ref 6.5–8.1)

## 2018-12-23 LAB — CALCIUM, IONIZED: Calcium, Ionized, Serum: 5.1 mg/dL (ref 4.5–5.6)

## 2018-12-23 LAB — GLUCOSE, CAPILLARY
Glucose-Capillary: 220 mg/dL — ABNORMAL HIGH (ref 70–99)
Glucose-Capillary: 190 mg/dL — ABNORMAL HIGH (ref 70–99)

## 2018-12-23 LAB — MAGNESIUM: Magnesium: 1.8 mg/dL (ref 1.7–2.4)

## 2018-12-23 MED ORDER — STERILE WATER FOR INJECTION IV SOLN
INTRAVENOUS | Status: AC
  Administered 2018-12-23: 17:00:00 via INTRAVENOUS
  Filled 2018-12-23: qty 1320

## 2018-12-23 MED ORDER — POTASSIUM CHLORIDE 10 MEQ/50ML IV SOLN
10.0000 meq | INTRAVENOUS | Status: AC
  Administered 2018-12-23 (×4): 10 meq via INTRAVENOUS
  Filled 2018-12-23 (×4): qty 50

## 2018-12-23 MED ORDER — SODIUM PHOSPHATES 45 MMOLE/15ML IV SOLN
30.0000 mmol | Freq: Once | INTRAVENOUS | Status: AC
  Administered 2018-12-24: 30 mmol via INTRAVENOUS
  Filled 2018-12-23: qty 10

## 2018-12-23 MED ORDER — POTASSIUM CHLORIDE 10 MEQ/50ML IV SOLN
10.0000 meq | INTRAVENOUS | Status: AC
  Administered 2018-12-24 (×4): 10 meq via INTRAVENOUS
  Filled 2018-12-23 (×4): qty 50

## 2018-12-23 MED ORDER — MAGNESIUM SULFATE 2 GM/50ML IV SOLN
2.0000 g | Freq: Once | INTRAVENOUS | Status: AC
  Administered 2018-12-23: 2 g via INTRAVENOUS
  Filled 2018-12-23: qty 50

## 2018-12-23 NOTE — Progress Notes (Signed)
3 Days Post-Op   Subjective/Chief Complaint: Continues with succus leak from midline - Eakin pouch now applied. No vasopressors currently. Remains on CRRT  Objective: Vital signs in last 24 hours: Temp:  [98.2 F (36.8 C)-99.1 F (37.3 C)] 98.2 F (36.8 C) (07/08 1920) Pulse Rate:  [105-125] 121 (07/09 0730) Resp:  [15-26] 19 (07/09 0730) BP: (91-165)/(33-56) 101/44 (07/09 0730) SpO2:  [96 %-100 %] 100 % (07/09 0735) FiO2 (%):  [30 %] 30 % (07/09 0735) Weight:  [65.2 kg] 65.2 kg (07/09 0452) Last BM Date: (possible stool coming through abd wound)  Intake/Output from previous day: 07/08 0701 - 07/09 0700 In: 6047.5 [I.V.:5582.6; IV Piggyback:464.8] Out: 6026 [Urine:340; Emesis/NG output:275; Stool:160] Intake/Output this shift: No intake/output data recorded.  Exam: On Vent Abdomen soft,  midline wound leaking succus from superior aspect; Eakin pouch in place with good seal  NG somewhat bilious   Lab Results:  Recent Labs    12/22/18 0415  12/23/18 0435 12/23/18 0507 12/23/18 0517  WBC 20.0*  --  19.6*  --   --   HGB 8.1*   < > 7.5* 8.2* 7.5*  HCT 25.5*   < > 24.0* 24.0* 22.0*  PLT 232  --  252  --   --    < > = values in this interval not displayed.   BMET Recent Labs    12/22/18 2038  12/23/18 0435 12/23/18 0507 12/23/18 0517  NA 142   < > 138 137 135  K 3.4*   < > 3.4* 3.2* 3.3*  CL 82*   < > 79* 78* 77*  CO2 41*  --  41*  --   --   GLUCOSE 201*   < > 187* 240* 202*  BUN 35*   < > 33* 26* 29*  CREATININE 0.95   < > 0.87 0.80 0.90  CALCIUM 12.0*  --  12.3*  --   --    < > = values in this interval not displayed.   PT/INR No results for input(s): LABPROT, INR in the last 72 hours. ABG No results for input(s): PHART, HCO3 in the last 72 hours.  Invalid input(s): PCO2, PO2  Studies/Results: Dg Abd Portable 1v  Result Date: 12/21/2018 CLINICAL DATA:  Evaluate OG placement EXAM: PORTABLE ABDOMEN - 1 VIEW COMPARISON:  KUB radiograph 01/05/2019 chest  radiograph 12/04/2018 FINDINGS: Transesophageal tube tip and side port are distal to the GE junction, tip terminating near the gastric antrum. Endotracheal tube terminates in the midtrachea. Left port catheter tip near the superior cavoatrial junction. Right subclavian venous catheter terminating near the superior cavoatrial junction. Right internal jugular catheter terminating in the lower SVC. No consolidative opacity in the visualized lungs. Enteric high-density material seen within the bowels. Visualized bowel gas pattern is unremarkable. Surgical clips noted in the right upper quadrant. IMPRESSION: Transesophageal tube tip near the gastric antrum, side port within the gastric body. Appropriate positioning of remaining visualized lines and tubes. Electronically Signed   By: MD Lovena Le   On: 12/21/2018 20:56    Anti-infectives: Anti-infectives (From admission, onward)   Start     Dose/Rate Route Frequency Ordered Stop   11/18/2018 1030  clindamycin (CLEOCIN) 900 mg, gentamicin (GARAMYCIN) 240 mg in sodium chloride 0.9 % 1,000 mL for intraperitoneal lavage      Irrigation To Surgery 11/29/2018 1026 11/24/2018 1042   12/11/18 1500  anidulafungin (ERAXIS) 100 mg in sodium chloride 0.9 % 100 mL IVPB  Status:  Discontinued  100 mg 78 mL/hr over 100 Minutes Intravenous Every 24 hours 11/22/2018 1007 01/04/2019 1103   12/11/18 0600  piperacillin-tazobactam (ZOSYN) IVPB 3.375 g     3.375 g 100 mL/hr over 30 Minutes Intravenous Every 6 hours 12/11/18 0317     12/09/2018 1800  piperacillin-tazobactam (ZOSYN) IVPB 2.25 g  Status:  Discontinued     2.25 g 100 mL/hr over 30 Minutes Intravenous Every 6 hours 12/10/2018 1413 12/11/18 0317   12/01/2018 1100  anidulafungin (ERAXIS) 200 mg in sodium chloride 0.9 % 200 mL IVPB     200 mg 78 mL/hr over 200 Minutes Intravenous  Once 11/19/2018 1007 11/16/2018 1456   12/07/2018 1100  piperacillin-tazobactam (ZOSYN) IVPB 3.375 g     3.375 g 100 mL/hr over 30 Minutes Intravenous   Once 11/19/2018 1027 12/14/2018 1245   12/08/18 1800  vancomycin (VANCOCIN) IVPB 750 mg/150 ml premix  Status:  Discontinued     750 mg 150 mL/hr over 60 Minutes Intravenous Every 48 hours 12/07/18 0822 12/07/18 0903   12/08/18 1000  ceFEPIme (MAXIPIME) 2 g in sodium chloride 0.9 % 100 mL IVPB  Status:  Discontinued     2 g 200 mL/hr over 30 Minutes Intravenous Every 24 hours 12/07/18 0822 12/07/18 0903   12/07/18 2200  ceFEPIme (MAXIPIME) 2 g in sodium chloride 0.9 % 100 mL IVPB  Status:  Discontinued     2 g 200 mL/hr over 30 Minutes Intravenous Every 12 hours 12/07/18 0903 12/07/2018 1027   12/07/18 1800  vancomycin (VANCOCIN) IVPB 750 mg/150 ml premix  Status:  Discontinued     750 mg 150 mL/hr over 60 Minutes Intravenous Every 24 hours 12/07/18 0903 12/09/18 1040   12/06/18 1800  vancomycin (VANCOCIN) IVPB 750 mg/150 ml premix  Status:  Discontinued     750 mg 150 mL/hr over 60 Minutes Intravenous Every 24 hours 12/05/18 1652 12/07/18 0822   12/05/18 1800  ceFEPIme (MAXIPIME) 2 g in sodium chloride 0.9 % 100 mL IVPB  Status:  Discontinued     2 g 200 mL/hr over 30 Minutes Intravenous Every 12 hours 12/05/18 1644 12/07/18 0822   12/05/18 1700  vancomycin (VANCOCIN) 1,250 mg in sodium chloride 0.9 % 250 mL IVPB     1,250 mg 166.7 mL/hr over 90 Minutes Intravenous  Once 12/05/18 1652 12/05/18 2019   12/14/2018 1800  fluconazole (DIFLUCAN) IVPB 200 mg  Status:  Discontinued     200 mg 100 mL/hr over 60 Minutes Intravenous Every 24 hours 12/02/2018 0748 11/29/2018 0847   11/18/2018 1800  fluconazole (DIFLUCAN) IVPB 400 mg  Status:  Discontinued     400 mg 100 mL/hr over 120 Minutes Intravenous Every 24 hours 12/12/2018 0847 11/22/2018 0856   12/11/2018 1400  piperacillin-tazobactam (ZOSYN) IVPB 2.25 g  Status:  Discontinued     2.25 g 100 mL/hr over 30 Minutes Intravenous Every 6 hours 11/20/2018 0748 12/01/18 1104   12/08/2018 0600  cefoTEtan (CEFOTAN) 2 g in sodium chloride 0.9 % 100 mL IVPB     2 g 200  mL/hr over 30 Minutes Intravenous On call to O.R. 11/22/18 1300 12/07/2018 0624   11/22/18 1800  fluconazole (DIFLUCAN) IVPB 400 mg  Status:  Discontinued     400 mg 100 mL/hr over 120 Minutes Intravenous Every 24 hours 12/04/2018 1528 11/19/2018 0748   11/22/18 1800  vancomycin (VANCOCIN) 1,500 mg in sodium chloride 0.9 % 500 mL IVPB  Status:  Discontinued     1,500 mg 250 mL/hr  over 120 Minutes Intravenous Every 24 hours 11/22/18 0358 11/22/18 0750   11/22/18 0115  vancomycin (VANCOCIN) 1,500 mg in sodium chloride 0.9 % 500 mL IVPB     1,500 mg 250 mL/hr over 120 Minutes Intravenous  Once 11/22/18 0101 11/22/18 0319   12/12/2018 1630  fluconazole (DIFLUCAN) IVPB 800 mg     800 mg 100 mL/hr over 240 Minutes Intravenous  Once 11/18/2018 1527 12/06/2018 2013   11/20/2018 1600  piperacillin-tazobactam (ZOSYN) IVPB 3.375 g  Status:  Discontinued     3.375 g 12.5 mL/hr over 240 Minutes Intravenous Every 8 hours 12/06/2018 1522 11/16/2018 0748   12/08/2018 0545  piperacillin-tazobactam (ZOSYN) IVPB 3.375 g     3.375 g 100 mL/hr over 30 Minutes Intravenous  Once 12/12/2018 0530 12/05/2018 0626   11/30/2018 0530  piperacillin-tazobactam (ZOSYN) IVPB 4.5 g  Status:  Discontinued     4.5 g 200 mL/hr over 30 Minutes Intravenous  Once 11/24/2018 0517 11/26/2018 0529      Assessment/Plan: s/p Procedure(s): EXPLORATORY LAPAROTOMY WITH WOUND WASHOUT AND SMALL BOWEL ANASTOMOSIS - 01/14/2019  -Remains critically ill - now with leak most likely from the anastomosis. I have discussed everything with her brother Iona Beard again this morning. -We discussed that at this point we have exhausted our surgical options for her - she was not a candidate for any diversion/stoma due to tethering; additionally she has ~6 inches of small bowel proximal to her anastomosis. We again discussed palliative options moving forward given all these findings. He expressed appreciation for all the work that has been done; stated that he and his family were still  undecided about plans moving forward but would take it "day to day" for now. Appreciate palliative care assisting in these discussions moving forward. -Will continue Eakin pouch to help control leakage -Appreciate CCMs assistance in her care   LOS: 32 days   Ileana Roup 12/23/2018

## 2018-12-23 NOTE — Progress Notes (Signed)
PT Cancellation Note  Patient Details Name: Carmen Stone MRN: [id number] DOB: [birth year]   Cancelled Treatment:    Reason Eval/Treat Not Completed: PT screened, no needs identified, will sign off  Order received for hydrotherapy.  Pt with open abdominal incision with feculent materal oozing from proximal end and Eakin pouch currently in place.  Pt not a surgical candidate.  Hydrotherapy would not be appropriate for this wound.  Recommend following [facility] RN note for wound care and "Cleanse abdominal wound with NS".  Also discussed with RN, who is in agreement.  [name], PT, DPT Acute Rehabilitation Services Office: [phone] Pager: [phone]     Trena Platt 12/23/2018, 4:41 PM

## 2018-12-23 NOTE — Progress Notes (Signed)
[facility] ROUNDING NOTE   Subjective:  I/O yest were even, UOP 300 cc yest.    Objective:  Vital signs in last 24 hours:  Temp:  [98.2 F (36.8 C)-100.7 F (38.2 C)] 99.8 F (37.7 C) (07/09 1200) Pulse Rate:  [104-131] 108 (07/09 1500) Resp:  [15-25] 17 (07/09 1500) BP: (91-156)/(33-62) 111/53 (07/09 1500) SpO2:  [96 %-100 %] 100 % (07/09 1524) FiO2 (%):  [30 %] 30 % (07/09 1524) Weight:  [65.2 kg] 65.2 kg (07/09 0452)  Weight change: 0.5 kg Filed Weights   12/22/18 0500 12/23/18 0452  Weight: 64.7 kg 65.2 kg    Intake/Output: I/O last 3 completed shifts: In: 9165.1 [I.V.:8449; IV Piggyback:716.1] Out: 9794 [Urine:387; Emesis/NG output:305; IAXKP:5374; Stool:160]   Intake/Output this shift:  Total I/O In: 2140.5 [I.V.:1890.6; IV Piggyback:249.8] Out: 2127 [Urine:112; Emesis/NG output:100; MOLMB:8675; Stool:30]  Exam: Intubated and sedated CVS- RRR no JVP RS- CTA ant and lat ABD- BS present soft non-distended EXT-1-2+ lower extremity edema   Assessment/ Plan:   Acute kidney injury/ ATN: with very little signs of recovery.  She has required CRRT since 12/12/2018.  Baseline serum creatinine 0.9.  Acute kidney injury following perforated small bowel with fecal peritonitis and septic shock. Getting citrate for anticoagulation of the circuit due to frequent clotting. Keeping even. Poor prognosis.   Perforated small bowel - multiple surgeries w/ significant SB resection.  Hypotension/volume: no vol excess on exam, well under dry wt. Getting TNA w/ volume load. Keeping even w/ CRRT.   Anemia - transfuse prn  CM EF 20%  Diabetes mellitus per primary team  Nutrition continues on TNA  Hypophosphatemia: resolved   Prognosis: poor prognosis, pall care working w/ family    Kelly Splinter, MD 12/23/2018, 3:44 PM     Basic Metabolic Panel: Recent Labs  Lab 01/05/2019 0500  12/26/2018 0345  12/21/18 0422  12/21/18 1607  12/22/18 4492  12/22/18 2038   12/23/18 0435 12/23/18 0507 12/23/18 0517 12/23/18 0920 12/23/18 0929  NA 135   < > 140  139   < > 140   < > 138   < > 138   < > 142   < > 138 137 135 137 136  K 4.3   < > 3.6  3.6   < > 3.3*   < > 3.8   < > 4.4   < > 3.4*   < > 3.4* 3.2* 3.3* 3.2* 3.2*  CL 105   < > 92*  91*   < > 92*   < > 90*   < > 88*   < > 82*   < > 79* 78* 77* 77* 76*  CO2 24   < > 36*  32   < > 34*  --  35*  --  35*  --  41*  --  41*  --   --   --   --   GLUCOSE 147*   < > 145*  146*   < > 193*   < > 239*   < > 186*   < > 201*   < > 187* 240* 202* 223* 181*  BUN 45*   < > 41*  41*   < > 36*   < > 32*   < > 29*   < > 35*   < > 33* 26* 29* 26* 29*  CREATININE 0.94   < > 0.96  1.00   < > 0.91   < >  0.82   < > 0.74   < > 0.95   < > 0.87 0.80 0.90 0.70 0.90  CALCIUM 7.1*   < > 9.5  9.4   < > 7.2*  --  8.7*  --  10.4*  --  12.0*  --  12.3*  --   --   --   --   MG 2.2  --  1.9  --  1.6*  --   --   --  2.0  --   --   --  1.8  --   --   --   --   PHOS 2.4*   < > 2.4*  2.4*   < > 2.0*  --  2.9  --  2.4*  --  3.4  --  2.5  --   --   --   --    < > = values in this interval not displayed.    Liver Function Tests: Recent Labs  Lab 12/18/2018 0345  12/21/18 0422 12/21/18 1607 12/22/18 0415 12/22/18 2038 12/23/18 0435  AST 25  --   --   --   --   --  20  ALT 19  --   --   --   --   --  16  ALKPHOS 139*  --   --   --   --   --  178*  BILITOT 0.4  --   --   --   --   --  0.5  PROT 4.9*  --   --   --   --   --  5.6*  ALBUMIN 1.3*  1.4*   < > 1.3* 1.3* 1.2* 1.2* 1.2*  1.2*   < > = values in this interval not displayed.   No results for input(s): LIPASE, AMYLASE in the last 168 hours. No results for input(s): AMMONIA in the last 168 hours.  CBC: Recent Labs  Lab 12/22/2018 1700  01/14/2019 0345  12/21/18 0422  12/22/18 0415  12/23/18 0435 12/23/18 0507 12/23/18 0517 12/23/18 0920 12/23/18 0929  WBC 19.1*  --  21.9*  --  20.7*  --  20.0*  --  19.6*  --   --   --   --   NEUTROABS  --   --  16.9*  --   --    --   --   --   --   --   --   --   --   HGB 9.3*   < > 9.2*   < > 8.7*   < > 8.1*   < > 7.5* 8.2* 7.5* 8.5* 8.2*  HCT 28.5*   < > 28.2*   < > 26.6*   < > 25.5*   < > 24.0* 24.0* 22.0* 25.0* 24.0*  MCV 91.6  --  90.7  --  95.3  --  97.0  --  97.2  --   --   --   --   PLT 170  --  206  --  199  --  232  --  252  --   --   --   --    < > = values in this interval not displayed.     Medications:   .  prismasol BGK 4/2.5 200 mL/hr at 12/23/18 1345  . sodium chloride Stopped (12/23/18 1429)  . calcium gluconate infusion for CRRT 140 mL/hr at 12/23/18 1448  . fentaNYL infusion INTRAVENOUS 100 mcg/hr (12/23/18 1500)  .  piperacillin-tazobactam (ZOSYN)  IV Stopped (12/23/18 1251)  . prismasol B22GK 4/0 1,500 mL/hr at 12/23/18 1525  . sodium citrate 2 %/dextrose 2.5% solution 3000 mL 640 mL/hr at 12/23/18 1449  . TPN ADULT (ION) 75 mL/hr at 12/23/18 1500  . TPN ADULT (ION)     . chlorhexidine gluconate (MEDLINE KIT)  15 mL Mouth Rinse BID  . Chlorhexidine Gluconate Cloth  6 each Topical Daily  . dorzolamide-timolol  1 drop Both Eyes BID  . insulin aspart  3-9 Units Subcutaneous Q6H  . levothyroxine  12.5 mcg Intravenous Daily  . mouth rinse  15 mL Mouth Rinse 10 times per day  . pantoprazole (PROTONIX) IV  40 mg Intravenous Q24H   sodium chloride, albuterol, alteplase, fentaNYL (SUBLIMAZE) injection, heparin, hydrALAZINE, metoprolol tartrate, sodium chloride, sodium chloride flush

## 2018-12-23 NOTE — Progress Notes (Signed)
Chaplain referred by RN.  Pt's brother, Iona Beard, at bedside.  Learning that further surgical intervention is not an option.   Introduced [place] as support.   Patient is supported by brother, Iona Beard, and daughter, [name].  Iona Beard is only family member in hospital now due to [place] restrictions.   Iona Beard is faithful christian and engages through praying for miracles for his sister.  He welcomes prayer at bedside.   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

## 2018-12-23 NOTE — Progress Notes (Signed)
PHARMACY - ADULT TOTAL PARENTERAL NUTRITION CONSULT NOTE   Pharmacy Consult for TPN Indication: prolonged ileus  HPI: 45 yoF admitted 6/7 with perforated small bowel from closed-loop obstruction and underwent emergent exploratory laparotomy. Returned to OR on 6/9 and again on 6/14 for closure of abdomen.  Patient coded on 2022-12-05 and developed multiorgan failure. Remains intubated & on CRRT. Pressors have been weaned off and she is completing antibiotic course for sepsis/PNA.  NPO since admission on 6/7. Pharmacy consulted to start TPN 6/16. D/t acute liver injury from shock, initiated TPN at low rate & advance slowly as requested by surgery.  Patient Measurements: Height: '5\' 4"'  (162.6 cm) Weight: 143 lb 11.8 oz (65.2 kg) IBW/kg (Calculated) : 54.7 TPN AdjBW (KG): 59.7 Body mass index is 24.67 kg/m. Usual Weight: 90kg  Significant events:  6/18: OK to increase to goal rate per surgery 6/22: CRRT stopped for holiday; CBG 69 overnight, D50 given but TPN continued (55 units insulin in TPN); BS and BM overnight - initiating trickle feeds 6/23: resuming CRRT, return to high-protein TPN; fevers overnight, CCM removing some central lines; will use chemo port for TPN 6/24: began adding lipids to TPN formula 6/25: trickle feeds stopped d/t abd distention 6/26: CRRT clotted off overnight; patient to OR this AM for ex lap; RN attempting to restart CRRT post-op but still having issues at this time 6/27: CRRT appears to have been successfully resumed overnight 6/29 OR pus throughout abdomen. Small hole in small bowel 7/3 OR wash out of abd, replace wound VAC, abd remains open 7/6 sched for OR today for wash out abd, replace wound VAC 7/8 new leak at surgical site; patient is not a candidate for additional surgeries. To continue TPN pending [place] discussion  Central access: 11/24/2018 TPN start date: 11/30/18  ASSESSMENT                                                                                                           Current Nutrition: NPO IVF: On CRRT to keep net neutral I/O  Today:   Glucose (goal 100-150) - Hx DM on 70/30 Novolog 20 units BID PTA.  CBGs remain elevated again with lipids in TPN (range 155-220)  40 units insulin in TPN + ICU resistant scale; 30 units SSI required yesterday  Electrolytes - on CRRT; Cl remains low and decreasing, K now low; Mg, Phos decreasing; corrected Ca elevated but ionized Ca from patient normal (confirmed with RN that neither AM labs nor iStat should be confounded by CRRT return or Ca Gluconate infusion)  Renal - on CRRT, SCr, BUN following; bicarb elevated and continues to climb (suspect Cl depletion alkalosis)  LFTs - albumin remains low; alk phos still mildly elevated and trending up, others WNL (7/9)  TGs - WNL (7/6)  Prealbumin - remains low but improving (7/6)  NUTRITIONAL GOALS  RD recs: (6/29):  Kcal:1493 kcal, Protein:195-215 gram Fluid:>/= 1.8 L/day  PLAN                                                                                                                          KCl 10 mEq IV x 4 per CCM  Mg 2g IV x 1  At 1800 today:   Continue TPN at 75 ml/hr   Custom TPN with lipids at goal rate of 75 ml/hr provides: 198 g/day protein, 1494 Kcal/day = 100% support  Electrolytes: increase K, Mg, Phos, Na (to add Cl), remove Ca; Cl:Ac = max Cl  TPN to contain standard MVI daily, trace elements MWF only due to national backorder.  IVF per MD - CRRT fluids  Continue ICU resistant-scale glycemic control orders q6h; increase to 45 units regular insulin in TPN   TPN lab panels Mondays & Thursdays  Ordered BID renal function panels while on CRRT  Reuel Boom, PharmD, BCPS [phone] 12/23/2018, 10:26 AM

## 2018-12-23 NOTE — Progress Notes (Signed)
NAME:  Carmen Stone, MRN:  [id number], DOB:  Aug 26, 1955, LOS: 51 ADMISSION DATE:  12/01/2018, CONSULTATION DATE:  11/21/2018 REFERRING MD:  Dr. Ninfa Linden, Surgery, CHIEF COMPLAINT:  Abdominal pain   Brief History   63 y/o female presented to ER 12/09/2018 with nausea, vomiting, abdominal pain.  CT abd/pelvis showed focal perforated loop of SB with free air with multiple complex ventral hernias.  Underwent emergent laparotomy with SB resection, lysis of adhesions and wound vac.  Remained on vent and pressors post op.  Hospital course complicated by respiratory arrest 6/13, multiple reintubations, intermittent vasopressor needs, AKI on CVVHD.     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 Changing volume goal on CRRT to -100 cc an hour. Weaned 7 hours PSV   6/16 Encephalopathic. Hypertensive, adding labetalol.  IV TNA started 6/17 A little more awake.  Tolerating PSV but SBI exceeds 105.  Volume removal with CVVHD 6/18 Neuro status improved.  Required brief vasoactive drips.  Net negative fluid volume status.PSV 6/19 Extubated 6/20 reintubated for altered mentation, head CT negative  6/21 Tx PRBC x1.  Off vasopressors 6/23 Febrile overnight, Blood Cx. Transfuse 1 U PRBC 6/24 Afebrile. Remains off pressors. 100cc/hr removal /c CRRT. 30% FiO2.  6/25 L IJ, subclavian, axillary  + DVT. Heparin initiated. Remains on CRRT.  6/26  CT ABD/pelvis positive for free air and ascites. Back to OR  for ex lap of perforated viscous, resection of previous anastomosis, application of wound vac.  1U RBCs intra op.   6/28 Back on pressors with hgb <7 / sedated on fent drip, PRBC 6/29 Pending OR for possible closure  7/01 Less sedation, following commands, abd open with VAC, on CVVHD (even) 7/03 OR, unable to close but bowel "looks some better", hematoma in abd, on CVVHD 7/05 hemoglobin drop with red drainage from wound vac, heparin gtt held, transfuse PRBC 7/06 laparotomy, washout, SB anastomosis  Consults:  Renal 6/09 AKI, acidosis Cardiology 6/09 Takotsubo CM  Palliative care 6/30 Goals of care  Procedures:  ETT 6/07 >> 6/19, 6/20 >> Rt IJ HD 6/09 >> Rt [place] CVL 6/09 >> Lt radial aline 6/09 >> discontinued  Significant Diagnostic Tests:  CT abd/pelvis 6/07 >> focal perforated loop of SB with free air with multiple complex ventral hernias Echo 6/08 >> EF 20%, Takotsubo CM CT head 6/20 >> negative for any acute findings CT abdomen 6/20 >> bilateral effusions, atelectasis left lower lobe, results noted.    CT Chest 6/20 >> small bilateral pleural effusions with compressive atelectasis, anterior R 4-7 rib fractures CT ABD/Pelvis 6/20 >> small bowel surgical changes, mild circumferential wall thickening of scattered small bowel loops, small to moderate ascites LUE Duplex 6/24 >> DVT L IJ, subclavian, axillary, SVT L cephalic  CT ABD/Pelvis 4/23 >> large volume ascites with intraperitoneal free air   Micro Data:  COVID 6/07 >> negative Tracheal aspirate 6/20 >> E Coli sensitive to zosyn BCx2 6/23 >> negative BCx2  6/26 >> negative   Antimicrobials:  Zosyn 6/07 >> 6/17 Diflucan 6/07 >> 6/10 Cefepime 6/21 >>6/26 Vanco 6/21 >>  6/25 Anidulafungin 6/26 >> 7/06 Zosyn 6/26 >>  Interim history/subjective:  Sedated on mechanical ventilation, on CRRT, intermittently follows commands with nursing when awake per nursing  Objective   Blood pressure (!) 110/49, pulse (!) 119, temperature 98.2 F (36.8 C), temperature source Axillary, resp. rate 18, height 5\' 4"  (1.626 m), weight  65.2 kg, SpO2 99 %.    Vent Mode: PRVC FiO2 (%):  [30 %] 30 % Set Rate:  [18 bmp] 18 bmp Vt Set:  [430 mL] 430 mL PEEP:  [5 cmH20] 5 cmH20 Pressure Support:  [10 cmH20] 10 cmH20 Plateau Pressure:  [18 cmH20-21 cmH20] 18 cmH20   Intake/Output Summary (Last 24 hours) at 12/23/2018 0735 Last data filed at 12/23/2018 0700 Gross per 24 hour  Intake 6047.46 ml  Output 6026 ml  Net 21.46 ml   Filed Weights   12/22/18 0500 12/23/18 0452  Weight: 64.7 kg 65.2 kg    Physical Exam: General: Chronically ill-appearing, sedated HENT: [place], AT, ETT in place Eyes: EOMI, no scleral icterus Respiratory: Anterior rhonchi bilaterally. Cardiovascular: Tachycardic, regular rate, -M/R/G, no JVD GI: midline wound with Eakin pouch in place, BS+, soft, nontender Extremities:-Edema,-tenderness Neuro: Sedated, grimaces to pain however no withdrawal, moves extremities x 4 spontaneously GU: Foley in place  Resolved Hospital Problem list   Metabolic alkalosis, Abdominal peritonitis with septic shock, Circulatory shock, Cardiorespiratory arrest 6/13, Shock liver, Hypophosphatemia, thrombocytopenia Assessment & Plan:   Acute hypoxic respiratory failure in setting of septic shock with peritonitis. Failure to wean - failed extubation attempt 3 times. Plan - Extubation precluded by mental status and ongoing critical illness - Not a candidate for tracheostomy due to poor prognosis and poor survival without definitive treatment with her abdominal issues - Pressure support as tolerated - Continue CRRT for net even volume status - VAP protocol  Small Bowel Perforation s/p Resection, Lysis of Adhesions. - Patient s/p small bowel anastomosis with leak. Per surgical team, no further surgical options available. Not currently in shock however will likely develop infection again Plan - Post op care and nutrition per CCS  Acute Metabolic Encephalopathy. Plan - RASS goal 0 to -1 - PAD protocol: Fentanyl  Fever. -  could be related to abdominal hematoma, anastomosis leak - E coli also in sputum from 6/21 Plan - Continue Zosyn Day 13 - May require antibiotics indefinitely  Anemia from blood loss with intraabdominal hematoma. 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 Plan - monitor hemodynamics - prn hydralazine for SBP > 160  Acute kidney injury from tubular necrosis. - Baseline creatinine of 0.73 from 11/29/2018 - No signs of renal recovery Plan - CRRT for net even volume status - Appreciate Nephrology input  Hypokalemia - Replete  Type 2 Diabetes. Plan - SSI  Acute DVT- Left IJ, subclavian, axillary. Plan - Hold heparin gtt in setting of abdominal hematoma  Moderate to Severe Protein Calorie Malnutrition.  Plan - Continue TPN  Hypothyroidism. Plan - IV synthroid  Goals of care. Plan - Appreciate Palliative care input. Family wishes to keep patient full code. - Will likely need Ethics consult as patient remains critically ill despite medical management and she is no longer a candidate for surgical treatment. She will likely develop recurrent infection with her known anastomosis leak and is unlikely to survive this hospitalization. Without definitive surgical options to repair her abdominal issues, continuing current medical care is futile  in my opinion.  Best practice:  Diet: TPN. DVT prophylaxis: SCDs GI prophylaxis: Protonix Mobility: Bedrest Code Status: Full code Disposition: ICU.    Labs:   CMP Latest Ref Rng & Units 12/23/2018 12/23/2018 12/23/2018  Glucose 70 - 99 mg/dL 202(H) 240(H) 187(H)  BUN 8 - 23 mg/dL 29(H) 26(H) 33(H)  Creatinine 0.44 - 1.00 mg/dL 0.90 0.80 0.87  Sodium 135 - 145 mmol/L 135 137 138  Potassium 3.5 - 5.1 mmol/L 3.3(L) 3.2(L) 3.4(L)  Chloride 98 - 111 mmol/L 77(L) 78(L) 79(L)  CO2 22 - 32 mmol/L - - 41(H)  Calcium 8.9 - 10.3 mg/dL - -  12.3(H)  Total Protein 6.5 - 8.1 g/dL - - 5.6(L)  Total Bilirubin 0.3 - 1.2 mg/dL - - 0.5  Alkaline Phos 38 - 126 U/L - - 178(H)  AST 15 - 41 U/L - - 20  ALT 0 - 44 U/L - - 16   CBC Latest Ref Rng & Units 12/23/2018 12/23/2018 12/23/2018  WBC 4.0 - 10.5 K/uL - - 19.6(H)  Hemoglobin 12.0 - 15.0 g/dL 7.5(L) 8.2(L) 7.5(L)  Hematocrit 36.0 - 46.0 % 22.0(L) 24.0(L) 24.0(L)  Platelets 150 - 400 K/uL - - 252    CBG (last 3)  Recent Labs    12/22/18 1744 12/22/18 2347 12/23/18 0514  GLUCAP 155* 168* 220*    The patient is critically ill with multiple organ systems failure and requires high complexity decision making for assessment and support, frequent evaluation and titration of therapies, application of advanced monitoring technologies and extensive interpretation of multiple databases.   Critical Care Time devoted to patient care services described in this note is 38 Minutes. Discussed care with Dr. Dema Severin, Surgery. Coordinated care with RT and RN.  Rodman Pickle, M.D. [facility] Pulmonary/Critical Care Medicine Pager: [phone] After hours pager: [phone]   Rodman Pickle, M.D. [facility] Pulmonary/Critical Care Medicine Pager: [phone] After hours pager: [phone]  12/23/2018, 7:35 AM

## 2018-12-24 DIAGNOSIS — Z515 Encounter for palliative care: Secondary | ICD-10-CM

## 2018-12-24 LAB — RENAL FUNCTION PANEL
Albumin: 1.2 g/dL — ABNORMAL LOW (ref 3.5–5.0)
Albumin: 1.2 g/dL — ABNORMAL LOW (ref 3.5–5.0)
Anion gap: 19 — ABNORMAL HIGH (ref 5–15)
Anion gap: 20 — ABNORMAL HIGH (ref 5–15)
BUN: 32 mg/dL — ABNORMAL HIGH (ref 8–23)
BUN: 32 mg/dL — ABNORMAL HIGH (ref 8–23)
CO2: 44 mmol/L — ABNORMAL HIGH (ref 22–32)
CO2: 48 mmol/L — ABNORMAL HIGH (ref 22–32)
Calcium: 10 mg/dL (ref 8.9–10.3)
Calcium: 10.6 mg/dL — ABNORMAL HIGH (ref 8.9–10.3)
Chloride: 77 mmol/L — ABNORMAL LOW (ref 98–111)
Chloride: 75 mmol/L — ABNORMAL LOW (ref 98–111)
Creatinine, Ser: 0.89 mg/dL (ref 0.44–1.00)
Creatinine, Ser: 0.89 mg/dL (ref 0.44–1.00)
GFR calc Af Amer: >60 mL/min (ref >60)
GFR calc Af Amer: >60 mL/min (ref >60)
GFR calc non Af Amer: >60 mL/min (ref >60)
GFR calc non Af Amer: >60 mL/min (ref >60)
Glucose, Bld: 166 mg/dL — ABNORMAL HIGH (ref 70–99)
Glucose, Bld: 176 mg/dL — ABNORMAL HIGH (ref 70–99)
Phosphorus: 2.7 mg/dL (ref 2.5–4.6)
Phosphorus: 2.7 mg/dL (ref 2.5–4.6)
Potassium: 3.7 mmol/L (ref 3.5–5.1)
Potassium: 3.3 mmol/L — ABNORMAL LOW (ref 3.5–5.1)
Sodium: 140 mmol/L (ref 135–145)
Sodium: 143 mmol/L (ref 135–145)

## 2018-12-24 LAB — POCT I-STAT, CHEM 8
BUN: 25 mg/dL — ABNORMAL HIGH (ref 8–23)
BUN: 28 mg/dL — ABNORMAL HIGH (ref 8–23)
BUN: 25 mg/dL — ABNORMAL HIGH (ref 8–23)
BUN: 24 mg/dL — ABNORMAL HIGH (ref 8–23)
BUN: 23 mg/dL (ref 8–23)
BUN: 25 mg/dL — ABNORMAL HIGH (ref 8–23)
Calcium, Ion: 0.42 mmol/L — CL (ref 1.15–1.40)
Calcium, Ion: 1.12 mmol/L — ABNORMAL LOW (ref 1.15–1.40)
Calcium, Ion: 0.42 mmol/L — CL (ref 1.15–1.40)
Calcium, Ion: 0.39 mmol/L — CL (ref 1.15–1.40)
Calcium, Ion: 0.35 mmol/L — CL (ref 1.15–1.40)
Calcium, Ion: 1.13 mmol/L — ABNORMAL LOW (ref 1.15–1.40)
Chloride: 78 mmol/L — ABNORMAL LOW (ref 98–111)
Chloride: 79 mmol/L — ABNORMAL LOW (ref 98–111)
Chloride: 77 mmol/L — ABNORMAL LOW (ref 98–111)
Chloride: 76 mmol/L — ABNORMAL LOW (ref 98–111)
Chloride: 77 mmol/L — ABNORMAL LOW (ref 98–111)
Chloride: 83 mmol/L — ABNORMAL LOW (ref 98–111)
Creatinine, Ser: 0.7 mg/dL (ref 0.44–1.00)
Creatinine, Ser: 0.9 mg/dL (ref 0.44–1.00)
Creatinine, Ser: 0.7 mg/dL (ref 0.44–1.00)
Creatinine, Ser: 0.7 mg/dL (ref 0.44–1.00)
Creatinine, Ser: 0.7 mg/dL (ref 0.44–1.00)
Creatinine, Ser: 0.8 mg/dL (ref 0.44–1.00)
Glucose, Bld: 168 mg/dL — ABNORMAL HIGH (ref 70–99)
Glucose, Bld: 240 mg/dL — ABNORMAL HIGH (ref 70–99)
Glucose, Bld: 305 mg/dL — ABNORMAL HIGH (ref 70–99)
Glucose, Bld: 263 mg/dL — ABNORMAL HIGH (ref 70–99)
Glucose, Bld: 119 mg/dL — ABNORMAL HIGH (ref 70–99)
Glucose, Bld: 226 mg/dL — ABNORMAL HIGH (ref 70–99)
HCT: 23 % — ABNORMAL LOW (ref 36.0–46.0)
HCT: 23 % — ABNORMAL LOW (ref 36.0–46.0)
HCT: 25 % — ABNORMAL LOW (ref 36.0–46.0)
HCT: 24 % — ABNORMAL LOW (ref 36.0–46.0)
HCT: 22 % — ABNORMAL LOW (ref 36.0–46.0)
HCT: 24 % — ABNORMAL LOW (ref 36.0–46.0)
Hemoglobin: 7.8 g/dL — ABNORMAL LOW (ref 12.0–15.0)
Hemoglobin: 7.8 g/dL — ABNORMAL LOW (ref 12.0–15.0)
Hemoglobin: 8.5 g/dL — ABNORMAL LOW (ref 12.0–15.0)
Hemoglobin: 8.2 g/dL — ABNORMAL LOW (ref 12.0–15.0)
Hemoglobin: 7.5 g/dL — ABNORMAL LOW (ref 12.0–15.0)
Hemoglobin: 8.2 g/dL — ABNORMAL LOW (ref 12.0–15.0)
Potassium: 3.6 mmol/L (ref 3.5–5.1)
Potassium: 3.8 mmol/L (ref 3.5–5.1)
Potassium: 3.5 mmol/L (ref 3.5–5.1)
Potassium: 3.2 mmol/L — ABNORMAL LOW (ref 3.5–5.1)
Potassium: 2.7 mmol/L — CL (ref 3.5–5.1)
Potassium: 3 mmol/L — ABNORMAL LOW (ref 3.5–5.1)
Sodium: 138 mmol/L (ref 135–145)
Sodium: 138 mmol/L (ref 135–145)
Sodium: 137 mmol/L (ref 135–145)
Sodium: 140 mmol/L (ref 135–145)
Sodium: 142 mmol/L (ref 135–145)
Sodium: 143 mmol/L (ref 135–145)
TCO2: 47 mmol/L — ABNORMAL HIGH (ref 22–32)
TCO2: 48 mmol/L — ABNORMAL HIGH (ref 22–32)
TCO2: 47 mmol/L — ABNORMAL HIGH (ref 22–32)
TCO2: 47 mmol/L — ABNORMAL HIGH (ref 22–32)
TCO2: 47 mmol/L — ABNORMAL HIGH (ref 22–32)
TCO2: 47 mmol/L — ABNORMAL HIGH (ref 22–32)

## 2018-12-24 LAB — CBC
HCT: 23.8 % — ABNORMAL LOW (ref 36.0–46.0)
Hemoglobin: 7.5 g/dL — ABNORMAL LOW (ref 12.0–15.0)
MCH: 31.1 pg (ref 26.0–34.0)
MCHC: 31.5 g/dL (ref 30.0–36.0)
MCV: 98.8 fL (ref 80.0–100.0)
Platelets: 253 10*3/uL (ref 150–400)
RBC: 2.41 MIL/uL — ABNORMAL LOW (ref 3.87–5.11)
RDW: 15 % (ref 11.5–15.5)
WBC: 16.6 10*3/uL — ABNORMAL HIGH (ref 4.0–10.5)
nRBC: 0 % (ref 0.0–0.2)

## 2018-12-24 LAB — GLUCOSE, CAPILLARY
Glucose-Capillary: 220 mg/dL — ABNORMAL HIGH (ref 70–99)
Glucose-Capillary: 175 mg/dL — ABNORMAL HIGH (ref 70–99)
Glucose-Capillary: 193 mg/dL — ABNORMAL HIGH (ref 70–99)
Glucose-Capillary: 187 mg/dL — ABNORMAL HIGH (ref 70–99)
Glucose-Capillary: 162 mg/dL — ABNORMAL HIGH (ref 70–99)

## 2018-12-24 LAB — MAGNESIUM: Magnesium: 2 mg/dL (ref 1.7–2.4)

## 2018-12-24 LAB — CALCIUM, IONIZED: Calcium, Ionized, Serum: 5.7 mg/dL — ABNORMAL HIGH (ref 4.5–5.6)

## 2018-12-24 MED ORDER — TRACE MINERALS CR-CU-MN-SE-ZN 10-1000-500-60 MCG/ML IV SOLN
INTRAVENOUS | Status: AC
  Administered 2018-12-24: 19:00:00 via INTRAVENOUS
  Filled 2018-12-24: qty 1320

## 2018-12-24 MED ORDER — FENTANYL BOLUS VIA INFUSION
25.0000 ug | INTRAVENOUS | Status: DC | PRN
  Administered 2018-12-24 – 2018-12-26 (×4): 25 ug via INTRAVENOUS
  Filled 2018-12-24: qty 25

## 2018-12-24 NOTE — Progress Notes (Signed)
4 Days Post-Op   Subjective/Chief Complaint: Continues with succus leak from midline - Eakin pouch now applied. Remains on CRRT  Objective: Vital signs in last 24 hours: Temp:  [98.6 F (37 C)-100.7 F (38.2 C)] 99.2 F (37.3 C) (07/10 0400) Pulse Rate:  [104-131] 126 (07/10 0700) Resp:  [16-25] 23 (07/10 0700) BP: (85-134)/(39-65) 103/46 (07/10 0700) SpO2:  [98 %-100 %] 100 % (07/10 0700) FiO2 (%):  [30 %] 30 % (07/10 0600) Weight:  [64.3 kg] 64.3 kg (07/10 0500) Last BM Date: (possible stool coming through abd wound)  Intake/Output from previous day: 07/09 0701 - 07/10 0700 In: 6409.6 [I.V.:5540.2; IV Piggyback:869.4] Out: 3662 [Urine:344; Emesis/NG output:320; Stool:275] Intake/Output this shift: No intake/output data recorded.  Exam: On Vent Abdomen soft, midline wound leaking succus from superior aspect; Eakin pouch in place  NG somewhat bilious   Lab Results:  Recent Labs    12/23/18 0435  12/24/18 0300 12/24/18 0310 12/24/18 0626  WBC 19.6*  --  16.6*  --   --   HGB 7.5*   < > 7.5* 7.8* 8.5*  HCT 24.0*   < > 23.8* 23.0* 25.0*  PLT 252  --  253  --   --    < > = values in this interval not displayed.   BMET Recent Labs    12/23/18 2134  12/24/18 0300 12/24/18 0310 12/24/18 0626  NA 141   < > 140 138 137  K 3.3*   < > 3.7 3.8 3.5  CL 80*   < > 77* 79* 77*  CO2 46*  --  44*  --   --   GLUCOSE 160*   < > 166* 168* 305*  BUN 34*   < > 32* 28* 25*  CREATININE 0.91   < > 0.89 0.90 0.70  CALCIUM 10.7*  --  10.0  --   --    < > = values in this interval not displayed.   PT/INR No results for input(s): LABPROT, INR in the last 72 hours. ABG No results for input(s): PHART, HCO3 in the last 72 hours.  Invalid input(s): PCO2, PO2  Studies/Results: No results found.  Anti-infectives: Anti-infectives (From admission, onward)   Start     Dose/Rate Route Frequency Ordered Stop   11/26/2018 1030  clindamycin (CLEOCIN) 900 mg, gentamicin (GARAMYCIN) 240  mg in sodium chloride 0.9 % 1,000 mL for intraperitoneal lavage      Irrigation To Surgery 11/17/2018 1026 12/09/2018 1042   12/11/18 1500  anidulafungin (ERAXIS) 100 mg in sodium chloride 0.9 % 100 mL IVPB  Status:  Discontinued     100 mg 78 mL/hr over 100 Minutes Intravenous Every 24 hours 12/14/2018 1007 01/04/2019 1103   12/11/18 0600  piperacillin-tazobactam (ZOSYN) IVPB 3.375 g     3.375 g 100 mL/hr over 30 Minutes Intravenous Every 6 hours 12/11/18 0317     11/30/2018 1800  piperacillin-tazobactam (ZOSYN) IVPB 2.25 g  Status:  Discontinued     2.25 g 100 mL/hr over 30 Minutes Intravenous Every 6 hours 12/08/2018 1413 12/11/18 0317   12/12/2018 1100  anidulafungin (ERAXIS) 200 mg in sodium chloride 0.9 % 200 mL IVPB     200 mg 78 mL/hr over 200 Minutes Intravenous  Once 12/03/2018 1007 12/14/2018 1456   11/17/2018 1100  piperacillin-tazobactam (ZOSYN) IVPB 3.375 g     3.375 g 100 mL/hr over 30 Minutes Intravenous  Once 11/24/2018 1027 11/17/2018 1245   12/08/18 1800  vancomycin (VANCOCIN) IVPB 750  mg/150 ml premix  Status:  Discontinued     750 mg 150 mL/hr over 60 Minutes Intravenous Every 48 hours 12/07/18 0822 12/07/18 0903   12/08/18 1000  ceFEPIme (MAXIPIME) 2 g in sodium chloride 0.9 % 100 mL IVPB  Status:  Discontinued     2 g 200 mL/hr over 30 Minutes Intravenous Every 24 hours 12/07/18 0822 12/07/18 0903   12/07/18 2200  ceFEPIme (MAXIPIME) 2 g in sodium chloride 0.9 % 100 mL IVPB  Status:  Discontinued     2 g 200 mL/hr over 30 Minutes Intravenous Every 12 hours 12/07/18 0903 12/12/2018 1027   12/07/18 1800  vancomycin (VANCOCIN) IVPB 750 mg/150 ml premix  Status:  Discontinued     750 mg 150 mL/hr over 60 Minutes Intravenous Every 24 hours 12/07/18 0903 12/09/18 1040   12/06/18 1800  vancomycin (VANCOCIN) IVPB 750 mg/150 ml premix  Status:  Discontinued     750 mg 150 mL/hr over 60 Minutes Intravenous Every 24 hours 12/05/18 1652 12/07/18 0822   12/05/18 1800  ceFEPIme (MAXIPIME) 2 g in  sodium chloride 0.9 % 100 mL IVPB  Status:  Discontinued     2 g 200 mL/hr over 30 Minutes Intravenous Every 12 hours 12/05/18 1644 12/07/18 0822   12/05/18 1700  vancomycin (VANCOCIN) 1,250 mg in sodium chloride 0.9 % 250 mL IVPB     1,250 mg 166.7 mL/hr over 90 Minutes Intravenous  Once 12/05/18 1652 12/05/18 2019   12/11/2018 1800  fluconazole (DIFLUCAN) IVPB 200 mg  Status:  Discontinued     200 mg 100 mL/hr over 60 Minutes Intravenous Every 24 hours 11/15/2018 0748 11/19/2018 0847   11/22/2018 1800  fluconazole (DIFLUCAN) IVPB 400 mg  Status:  Discontinued     400 mg 100 mL/hr over 120 Minutes Intravenous Every 24 hours 12/09/2018 0847 12/14/2018 0856   11/26/2018 1400  piperacillin-tazobactam (ZOSYN) IVPB 2.25 g  Status:  Discontinued     2.25 g 100 mL/hr over 30 Minutes Intravenous Every 6 hours 11/15/2018 0748 12/01/18 1104   12/11/2018 0600  cefoTEtan (CEFOTAN) 2 g in sodium chloride 0.9 % 100 mL IVPB     2 g 200 mL/hr over 30 Minutes Intravenous On call to O.R. 11/22/18 1300 12/06/2018 0624   11/22/18 1800  fluconazole (DIFLUCAN) IVPB 400 mg  Status:  Discontinued     400 mg 100 mL/hr over 120 Minutes Intravenous Every 24 hours 12/09/2018 1528 12/09/2018 0748   11/22/18 1800  vancomycin (VANCOCIN) 1,500 mg in sodium chloride 0.9 % 500 mL IVPB  Status:  Discontinued     1,500 mg 250 mL/hr over 120 Minutes Intravenous Every 24 hours 11/22/18 0358 11/22/18 0750   11/22/18 0115  vancomycin (VANCOCIN) 1,500 mg in sodium chloride 0.9 % 500 mL IVPB     1,500 mg 250 mL/hr over 120 Minutes Intravenous  Once 11/22/18 0101 11/22/18 0319   12/09/2018 1630  fluconazole (DIFLUCAN) IVPB 800 mg     800 mg 100 mL/hr over 240 Minutes Intravenous  Once 12/11/2018 1527 12/11/2018 2013   11/29/2018 1600  piperacillin-tazobactam (ZOSYN) IVPB 3.375 g  Status:  Discontinued     3.375 g 12.5 mL/hr over 240 Minutes Intravenous Every 8 hours 12/01/2018 1522 11/16/2018 0748   12/08/2018 0545  piperacillin-tazobactam (ZOSYN) IVPB 3.375 g      3.375 g 100 mL/hr over 30 Minutes Intravenous  Once 11/22/2018 0530 12/06/2018 0626   11/26/2018 0530  piperacillin-tazobactam (ZOSYN) IVPB 4.5 g  Status:  Discontinued  4.5 g 200 mL/hr over 30 Minutes Intravenous  Once 12/01/2018 0517 12/12/2018 0529      Assessment/Plan: s/p Procedure(s): EXPLORATORY LAPAROTOMY WITH WOUND WASHOUT AND SMALL BOWEL ANASTOMOSIS - 12/18/2018  -Remains critically ill - now with leak -I called and spoke with Iona Beard today. We discussed that at this point we have exhausted our surgical options for her - she was not a candidate for any diversion/stoma due to tethering; additionally she has ~6 inches of small bowel proximal to her anastomosis with total of ~1 foot of small bowel remaining that I could see. We again discussed palliative options moving forward given all these findings. He and her daughter were planning to come by today and think about things but has requested we continue maximal medical support for time being. -Will continue Eakin pouch to help control leakage -We appreciate palliative care assisting in these discussions as well. -Appreciate CCMs assistance in her care   LOS: 81 days   Ileana Roup 12/24/2018

## 2018-12-24 NOTE — Progress Notes (Signed)
PCCM Interval Note  Family meeting held with patient's daughter and brother. Family described patient has energetic, hard working and independent prior to this hospitalization. Ms. Hafer was working two jobs, taking care of her own home and caring for her needs without assistance. We discussed her clinical course in the last month regarding her abdominal surgeris and her current dependence on life support devices including mechanical ventilation and CRRT. I explicitly stated that she does not any definitive surgical management for her leaking anastomosis and that her risk of re-infection is inevitable. I also expressed that the likelihood of her achieving independent status that she had before was low and that she will unlikely survive this hospitalization without the use of life support. We discussed options including withdrawal of care followed by hospice vs continuing current management. We also discussed CPR in the event the patient's heart were to stop.  Family states that they have had similar conversations in the past with other family members and understand patient's critical condition. Her brother stated that currently the family is going through a difficult time and that a decision will need to be discussed with the remainder of the family first and readdress this tomorrow morning.  I offered to be a part of any further family discussions if they wished. I let them know my partner will take over tomorrow and continue this discussion.  Rodman Pickle, M.D. [facility] Pulmonary/Critical Care Medicine Pager: [phone] After hours pager: [phone]

## 2018-12-24 NOTE — Progress Notes (Signed)
[facility] ROUNDING NOTE   Subjective:  I/O yest were even, UOP 300 cc yest.    Objective:  Vital signs in last 24 hours:  Temp:  [98.6 F (37 C)-99.6 F (37.6 C)] 98.8 F (37.1 C) (07/10 1200) Pulse Rate:  [107-129] 117 (07/10 1330) Resp:  [16-25] 17 (07/10 1330) BP: (85-134)/(39-65) 100/47 (07/10 1330) SpO2:  [99 %-100 %] 100 % (07/10 1330) FiO2 (%):  [30 %] 30 % (07/10 1200) Weight:  [64.3 kg] 64.3 kg (07/10 0500)  Weight change: -0.9 kg Filed Weights   12/22/18 0500 12/23/18 0452 12/24/18 0500  Weight: 64.7 kg 65.2 kg 64.3 kg    Intake/Output: I/O last 3 completed shifts: In: 9238.6 [I.V.:8269.2; IV Piggyback:969.4] Out: 9783 [Urine:529; Emesis/NG output:445; WERXV:4008; Stool:435]   Intake/Output this shift:  Total I/O In: 1620.9 [I.V.:1600.4; IV Piggyback:20.4] Out: 6761 [Urine:198; Emesis/NG output:75; Other:978; Stool:33]  Exam: Intubated and sedated CVS- RRR no JVP RS- CTA ant and lat ABD- BS present soft non-distended EXT-1-2+ lower extremity edema   Assessment/ Plan:   Acute kidney injury/ ATN: with very little signs of recovery.  She has required CRRT since 11/17/2018.  Baseline serum creatinine 0.9.  Acute kidney injury following perforated small bowel with fecal peritonitis and septic shock. Getting citrate for anticoagulation of the circuit due to frequent clotting. Keeping even. Poor prognosis.   Perforated small bowel - multiple surgeries w/ significant SB resection  Hypotension/volume: no vol excess on exam, well under dry wt. Getting TNA w/ volume load. Keeping even w/ CRRT.   Anemia - transfuse prn  CM EF 20%  Diabetes mellitus per primary team  Nutrition continues on TNA  Hypophosphatemia: resolved   Prognosis: poor prognosis, pall care and CCM working w/ family    Kelly Splinter, MD 12/24/2018, 2:57 PM     Basic Metabolic Panel: Recent Labs  Lab 01/08/2019 0345  12/21/18 0422  12/22/18 9509  12/22/18 2038   12/23/18 0435  12/23/18 2134  12/24/18 0259 12/24/18 0300 12/24/18 0310 12/24/18 0626 12/24/18 1127  NA 140  139   < > 140   < > 138   < > 142   < > 138   < > 141   < > 138 140 138 137 140  K 3.6  3.6   < > 3.3*   < > 4.4   < > 3.4*   < > 3.4*   < > 3.3*   < > 3.6 3.7 3.8 3.5 3.2*  CL 92*  91*   < > 92*   < > 88*   < > 82*   < > 79*   < > 80*   < > 78* 77* 79* 77* 76*  CO2 36*  32   < > 34*   < > 35*  --  41*  --  41*  --  46*  --   --  44*  --   --   --   GLUCOSE 145*  146*   < > 193*   < > 186*   < > 201*   < > 187*   < > 160*   < > 240* 166* 168* 305* 263*  BUN 41*  41*   < > 36*   < > 29*   < > 35*   < > 33*   < > 34*   < > 25* 32* 28* 25* 24*  CREATININE 0.96  1.00   < > 0.91   < >  0.74   < > 0.95   < > 0.87   < > 0.91   < > 0.70 0.89 0.90 0.70 0.70  CALCIUM 9.5  9.4   < > 7.2*   < > 10.4*  --  12.0*  --  12.3*  --  10.7*  --   --  10.0  --   --   --   MG 1.9  --  1.6*  --  2.0  --   --   --  1.8  --   --   --   --  2.0  --   --   --   PHOS 2.4*  2.4*   < > 2.0*   < > 2.4*  --  3.4  --  2.5  --  1.8*  --   --  2.7  --   --   --    < > = values in this interval not displayed.    Liver Function Tests: Recent Labs  Lab 01/01/2019 0345  12/22/18 0415 12/22/18 2038 12/23/18 0435 12/23/18 2134 12/24/18 0300  AST 25  --   --   --  20  --   --   ALT 19  --   --   --  16  --   --   ALKPHOS 139*  --   --   --  178*  --   --   BILITOT 0.4  --   --   --  0.5  --   --   PROT 4.9*  --   --   --  5.6*  --   --   ALBUMIN 1.3*  1.4*   < > 1.2* 1.2* 1.2*  1.2* 1.2* 1.2*   < > = values in this interval not displayed.   No results for input(s): LIPASE, AMYLASE in the last 168 hours. No results for input(s): AMMONIA in the last 168 hours.  CBC: Recent Labs  Lab 01/01/2019 0345  12/21/18 0422  12/22/18 0415  12/23/18 0435  12/24/18 0259 12/24/18 0300 12/24/18 0310 12/24/18 0626 12/24/18 1127  WBC 21.9*  --  20.7*  --  20.0*  --  19.6*  --   --  16.6*  --   --   --    NEUTROABS 16.9*  --   --   --   --   --   --   --   --   --   --   --   --   HGB 9.2*   < > 8.7*   < > 8.1*   < > 7.5*   < > 7.8* 7.5* 7.8* 8.5* 8.2*  HCT 28.2*   < > 26.6*   < > 25.5*   < > 24.0*   < > 23.0* 23.8* 23.0* 25.0* 24.0*  MCV 90.7  --  95.3  --  97.0  --  97.2  --   --  98.8  --   --   --   PLT 206  --  199  --  232  --  252  --   --  253  --   --   --    < > = values in this interval not displayed.     Medications:   .  prismasol BGK 4/2.5 200 mL/hr at 12/24/18 1455  . sodium chloride 5 mL/hr at 12/24/18 1400  . calcium gluconate infusion for CRRT 140 mL/hr at 12/24/18 1200  . fentaNYL  infusion INTRAVENOUS 125 mcg/hr (12/24/18 1400)  . prismasol B22GK 4/0 1,500 mL/hr at 12/24/18 1456  . sodium citrate 2 %/dextrose 2.5% solution 3000 mL 250 mL/hr at 12/24/18 1247  . TPN ADULT (ION) 75 mL/hr at 12/24/18 1400  . TPN ADULT (ION)     . chlorhexidine gluconate (MEDLINE KIT)  15 mL Mouth Rinse BID  . Chlorhexidine Gluconate Cloth  6 each Topical Daily  . dorzolamide-timolol  1 drop Both Eyes BID  . insulin aspart  3-9 Units Subcutaneous Q6H  . levothyroxine  12.5 mcg Intravenous Daily  . mouth rinse  15 mL Mouth Rinse 10 times per day  . pantoprazole (PROTONIX) IV  40 mg Intravenous Q24H   sodium chloride, albuterol, alteplase, fentaNYL, heparin, hydrALAZINE, metoprolol tartrate, sodium chloride, sodium chloride flush

## 2018-12-24 NOTE — Progress Notes (Addendum)
NAME:  Carmen Stone, MRN:  [id number], DOB:  02-Sep-1955, LOS: 47 ADMISSION DATE:  11/30/2018, CONSULTATION DATE:  11/21/2018 REFERRING MD:  Dr. Ninfa Linden, Surgery, CHIEF COMPLAINT:  Abdominal pain   Brief History   63 y/o female presented to ER 11/20/2018 with nausea, vomiting, abdominal pain.  CT abd/pelvis showed focal perforated loop of SB with free air with multiple complex ventral hernias.  Underwent emergent laparotomy with SB resection, lysis of adhesions and wound vac.  Remained on vent and pressors post op.  Hospital course complicated by respiratory arrest 6/13, multiple reintubations, intermittent vasopressor needs, AKI on CVVHD.     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 Changing volume goal on CRRT to -100 cc an hour. Weaned 7 hours PSV   6/16 Encephalopathic. Hypertensive, adding labetalol.  IV TNA started 6/17 A little more awake.  Tolerating PSV but SBI exceeds 105.  Volume removal with CVVHD 6/18 Neuro status improved.  Required brief vasoactive drips.  Net negative fluid volume status.PSV 6/19 Extubated 6/20 reintubated for altered mentation, head CT negative  6/21 Tx PRBC x1.  Off vasopressors 6/23 Febrile overnight, Blood Cx. Transfuse 1 U PRBC 6/24 Afebrile. Remains off pressors. 100cc/hr removal /c CRRT. 30% FiO2.  6/25 L IJ, subclavian, axillary  + DVT. Heparin initiated. Remains on CRRT.  6/26  CT ABD/pelvis positive for free air and ascites. Back to OR  for ex lap of perforated viscous, resection of previous anastomosis, application of wound vac.  1U RBCs intra op.   6/28 Back on pressors with hgb <7 / sedated on fent drip, PRBC 6/29 Pending OR for possible closure  7/01 Less sedation, following commands, abd open with VAC, on CVVHD (even) 7/03 OR, unable to close but bowel "looks some better", hematoma in abd, on CVVHD 7/05 hemoglobin drop with red drainage from wound vac, heparin gtt held, transfuse PRBC 7/06 laparotomy, washout, SB anastomosis  Consults:  Renal 6/09 AKI, acidosis Cardiology 6/09 Takotsubo CM  Palliative care 6/30 Goals of care  Procedures:  ETT 6/07 >> 6/19, 6/20 >> Rt IJ HD 6/09 >> Rt [place] CVL 6/09 >> Lt radial aline 6/09 >> discontinued  Significant Diagnostic Tests:  CT abd/pelvis 6/07 >> focal perforated loop of SB with free air with multiple complex ventral hernias Echo 6/08 >> EF 20%, Takotsubo CM CT head 6/20 >> negative for any acute findings CT abdomen 6/20 >> bilateral effusions, atelectasis left lower lobe, results noted.    CT Chest 6/20 >> small bilateral pleural effusions with compressive atelectasis, anterior R 4-7 rib fractures CT ABD/Pelvis 6/20 >> small bowel surgical changes, mild circumferential wall thickening of scattered small bowel loops, small to moderate ascites LUE Duplex 6/24 >> DVT L IJ, subclavian, axillary, SVT L cephalic  CT ABD/Pelvis 9/37 >> large volume ascites with intraperitoneal free air   Micro Data:  COVID 6/07 >> negative Tracheal aspirate 6/20 >> E Coli sensitive to zosyn BCx2 6/23 >> negative BCx2  6/26 >> negative   Antimicrobials:  Zosyn 6/07 >> 6/17 Diflucan 6/07 >> 6/10 Cefepime 6/21 >>6/26 Vanco 6/21 >>  6/25 Anidulafungin 6/26 >> 7/06 Zosyn 6/26 >>7/10  Interim history/subjective:  Grimaces to voice and touch. Refusing mouth care per nursing. Appears uncomfortable.  Objective   Blood pressure (!) 103/46, pulse (!) 126, temperature 99.2 F (37.3 C), temperature source Axillary, resp. rate (!) 23, height 5\' 4"  (1.626 m), weight 64.3 kg, SpO2 100  %.    Vent Mode: PRVC FiO2 (%):  [30 %] 30 % Set Rate:  [18 bmp] 18 bmp Vt Set:  [430 mL] 430 mL PEEP:  [5 cmH20-8 cmH20] 8 cmH20 Plateau Pressure:  [17 cmH20-23 cmH20] 19 cmH20   Intake/Output Summary (Last 24 hours) at 12/24/2018 0728 Last data filed at 12/24/2018 0700 Gross per 24 hour  Intake 6409.57 ml  Output 6844 ml  Net -434.43 ml   Filed Weights   12/22/18 0500 12/23/18 0452 12/24/18 0500  Weight: 64.7 kg 65.2 kg 64.3 kg   Physical Exam: General: Chronically-appearing, appears uncomfortable HENT: [place], AT, ETT in place Eyes: EOMI, no scleral icterus Respiratory: Mild anterior rhonchi present Cardiovascular: Tachycardic, regular rhythm, -M/R/G, no JVD GI: open midline wound with Eakin pouch in place, tender Extremities:-Edema,-tenderness Neuro: Grimaces to pain however no withdrawal, does not follow commands  Resolved Hospital Problem list   Metabolic alkalosis, Abdominal peritonitis with septic shock, Circulatory shock, Cardiorespiratory arrest 6/13, Shock liver, Hypophosphatemia, thrombocytopenia  Assessment & Plan:  63 year old critically ill female who remains in the ICU for mechanical ventilation and CRRT dependence. She was admitted to [facility] for perforated small bowel c/b septic shock. PCCM consulted to manage critical illness. Shock has resolved however patient remains high risk for recurrent infection due to anastomosis with leak. Unfortunately, patient is not a surgical candidate for further repair. She is also a poor candidate for long-term dialysis. Palliative has been involved however family wishes to continue care. Ethics has been consulted.  Small Bowel Perforation s/p Resection, Lysis of Adhesions. - Patient s/p small bowel anastomosis with leak. Per surgical team, no further surgical options available. Not currently in shock however will likely develop infection again Plan - Post- op care and nutrition per primary team: [facility]   Acute hypoxic respiratory  failure in setting of septic shock with peritonitis. Failure to wean - failed extubation attempt 3 times. Plan - Extubation precluded by mental status and ongoing critical illness - Not a candidate for tracheostomy due to poor prognosis and poor survival without definitive treatment with her abdominal issues - Pressure support as tolerate - VAP protocol  Acute Metabolic Encephalopathy. Plan - RASS goal 0 to -1 - PAD protocol: Fentanyl  Fever, leukocytosis: Improving. - Could be related to abdominal hematoma, anastomosis leak - E coli also in sputum from 6/21 Plan - Complete Zosyn Day 14 - Low threshold to restart antibiotics  Anemia from blood loss with intraabdominal hematoma. 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 Plan - monitor hemodynamics - prn hydralazine for SBP > 160  Acute kidney injury from tubular necrosis. - Baseline creatinine of 0.73 from 12/05/2018 - No signs of renal recovery. Unlikely to be a hemodialysis candidate. Plan - CRRT for slightly positive balance due to dry status - Appreciate Nephrology input  Hypokalemia - Replete  Type 2 Diabetes. Plan - SSI  Acute DVT- Left IJ, subclavian, axillary. Plan - Hold heparin gtt in setting of abdominal hematoma  Moderate to Severe Protein Calorie Malnutrition.  Plan - Continue  TPN  Hypothyroidism. Plan - IV synthroid  Goals of care. Plan - Appreciate Palliative care input. Family wishes to keep patient full code. - Ethics consulted on 7/9 as patient remains critically ill despite medical management and she is no longer a candidate for surgical treatment. She will likely develop recurrent infection with her known anastomosis leak and is unlikely to survive this hospitalization. Without definitive surgical options to repair her abdominal issues, continuing current medical care is futile in my  opinion.  Best practice:  Diet: TPN. DVT prophylaxis: SCDs GI prophylaxis: Protonix Mobility: Bedrest Code Status: Full code Disposition: ICU.    Labs:   CMP Latest Ref Rng & Units 12/24/2018 12/24/2018 12/24/2018  Glucose 70 - 99 mg/dL 305(H) 168(H) 166(H)  BUN 8 - 23 mg/dL 25(H) 28(H) 32(H)  Creatinine 0.44 - 1.00 mg/dL 0.70 0.90 0.89  Sodium 135 - 145 mmol/L 137 138 140  Potassium 3.5 - 5.1 mmol/L 3.5 3.8 3.7  Chloride 98 - 111 mmol/L 77(L) 79(L) 77(L)  CO2 22 - 32 mmol/L - - 44(H)  Calcium 8.9 - 10.3 mg/dL - - 10.0  Total Protein 6.5 - 8.1 g/dL - - -  Total Bilirubin 0.3 - 1.2 mg/dL - - -  Alkaline Phos 38 - 126 U/L - - -  AST 15 - 41 U/L - - -  ALT 0 - 44 U/L - - -   CBC Latest Ref Rng & Units 12/24/2018 12/24/2018 12/24/2018  WBC 4.0 - 10.5 K/uL - - 16.6(H)  Hemoglobin 12.0 - 15.0 g/dL 8.5(L) 7.8(L) 7.5(L)  Hematocrit 36.0 - 46.0 % 25.0(L) 23.0(L) 23.8(L)  Platelets 150 - 400 K/uL - - 253    CBG (last 3)  Recent Labs    12/23/18 0514 12/23/18 1413 12/24/18 0600  GLUCAP 220* 190* 220*   The patient is critically ill with multiple organ systems failure and requires high complexity decision making for assessment and support, frequent evaluation and titration of therapies, application of advanced monitoring technologies and extensive interpretation of multiple databases.   Critical Care Time devoted to patient care services described in this note is 70 Minutes. This time reflects time of care of this signee Dr. Rodman Pickle. This critical care time does not reflect procedure time, or teaching time or supervisory time of PA/NP/Med student/Med Resident etc but could involve care discussion time.  Discussed care with [facility], Dr. Dema Severin.  Rodman Pickle, M.D. [facility] Pulmonary/Critical Care Medicine Pager: [phone] After hours pager: [phone]  12/24/2018, 7:28 AM

## 2018-12-24 NOTE — Progress Notes (Signed)
Chaplain follow up with family for continued support family meeting with care team.    Family had left room and chaplain found them in parking lot.  Provided brief support around meeting.  When asked how she is doing, Carmen Stone's daughter, Carmen Stone, stated "we are preparing'"  Family requested chaplain follow up this evening after 6:30.  Will make referral to night chaplain for continued support when family returns to hospital today.

## 2018-12-24 NOTE — Progress Notes (Signed)
PHARMACY - ADULT TOTAL PARENTERAL NUTRITION CONSULT NOTE   Pharmacy Consult for TPN Indication: prolonged ileus  HPI: [name] admitted 6/7 with perforated small bowel from closed-loop obstruction and underwent emergent exploratory laparotomy. Returned to OR on 6/9 and again on 6/14 for closure of abdomen.  Patient coded on 2022-12-12 and developed multiorgan failure. Remains intubated & on CRRT. Pressors have been weaned off and she is completing antibiotic course for sepsis/PNA.  NPO since admission on 6/7. Pharmacy consulted to start TPN 6/16. D/t acute liver injury from shock, initiated TPN at low rate & advance slowly as requested by surgery.  Patient Measurements: Height: '5\' 4"'  (162.6 cm) Weight: 141 lb 12.1 oz (64.3 kg) IBW/kg (Calculated) : 54.7 TPN AdjBW (KG): 59.7 Body mass index is 24.33 kg/m. Usual Weight: 90kg  Significant events:  6/18: OK to increase to goal rate per surgery 6/22: CRRT stopped for holiday; CBG 69 overnight, D50 given but TPN continued (55 units insulin in TPN); BS and BM overnight - initiating trickle feeds 6/23: resuming CRRT, return to high-protein TPN; fevers overnight, CCM removing some central lines; will use chemo port for TPN 6/24: began adding lipids to TPN formula 6/25: trickle feeds stopped d/t abd distention 6/26: CRRT clotted off overnight; patient to OR this AM for ex lap; RN attempting to restart CRRT post-op but still having issues at this time 6/27: CRRT appears to have been successfully resumed overnight 6/29 OR pus throughout abdomen. Small hole in small bowel 7/3 OR wash out of abd, replace wound VAC, abd remains open 7/6 sched for OR today for wash out abd, replace wound VAC 7/8 new leak at surgical site; patient is not a candidate for additional surgeries. To continue TPN pending [place] discussion  Central access: 12/12/2018 TPN start date: 11/30/18  ASSESSMENT                                                                                                           Current Nutrition: NPO IVF: On CRRT to keep net neutral I/O  Today:   Glucose (goal 100-150) - Hx DM on 70/30 Novolog 20 units BID PTA.  CBGs remain elevated but slightly improved with increased insulin in TPN (range 168-220)  45 units insulin in TPN + ICU resistant scale; 27 units SSI required yesterday  Patient has required this much insulin in the past and then become hypoglycemic so don't suspect this is her baseline. Possibly d/t renewed infection d/t bowel leakage (fever noted yesterday) - f/u CBGs closely, low threshold to pause TPN if becoming hypoglycemic  Electrolytes - on CRRT; continues to lose Cl; K, Phos low overnight and replaced; Mg stable; corrected Ca elevated but ionized Ca from patient normal (confirmed with RN that neither AM labs nor patient iStat were drawn from CRRT return line or Ca Gluconate infusion)  Renal - on CRRT; SCr, BUN following; bicarb elevated and continues to climb (suspect Cl depletion alkalosis)  LFTs - albumin remains low; alk phos still mildly elevated and trending up, others WNL (7/9)  TGs - WNL (  7/6)  Prealbumin - remains low but improving (7/6)  NUTRITIONAL GOALS                                                                                             RD recs: (6/29):  Kcal:1493 kcal, Protein:195-215 gram Fluid:>/= 1.8 L/day  PLAN                                                                                                                         At 1800 today:   Continue TPN at 75 ml/hr   Custom TPN with lipids at goal rate of 75 ml/hr provides: 198 g/day protein, 1494 Kcal/day = 100% support  Electrolytes: increase K, Phos, Na (to add Cl), remove Ca (will let this be controlled by Citrate & Ca infusion titration); Cl:Ac = max Cl  TPN to contain standard MVI daily, trace elements MWF only due to national backorder.  IVF per MD - CRRT fluids  Continue ICU resistant-scale glycemic control orders q6h;  increase to 55 units regular insulin in TPN   TPN lab panels Mondays & Thursdays  Ordered BID renal function panels while on CRRT  Reuel Boom, PharmD, BCPS [phone] 12/24/2018, 7:12 AM

## 2018-12-25 LAB — RENAL FUNCTION PANEL
Albumin: 1.2 g/dL — ABNORMAL LOW (ref 3.5–5.0)
Albumin: 1.2 g/dL — ABNORMAL LOW (ref 3.5–5.0)
Anion gap: 15 (ref 5–15)
Anion gap: 13 (ref 5–15)
BUN: 31 mg/dL — ABNORMAL HIGH (ref 8–23)
BUN: 30 mg/dL — ABNORMAL HIGH (ref 8–23)
CO2: 49 mmol/L — ABNORMAL HIGH (ref 22–32)
CO2: 46 mmol/L — ABNORMAL HIGH (ref 22–32)
Calcium: 10 mg/dL (ref 8.9–10.3)
Calcium: 9.1 mg/dL (ref 8.9–10.3)
Chloride: 81 mmol/L — ABNORMAL LOW (ref 98–111)
Chloride: 85 mmol/L — ABNORMAL LOW (ref 98–111)
Creatinine, Ser: 0.79 mg/dL (ref 0.44–1.00)
Creatinine, Ser: 0.71 mg/dL (ref 0.44–1.00)
GFR calc Af Amer: >60 mL/min (ref >60)
GFR calc Af Amer: >60 mL/min (ref >60)
GFR calc non Af Amer: >60 mL/min (ref >60)
GFR calc non Af Amer: >60 mL/min (ref >60)
Glucose, Bld: 124 mg/dL — ABNORMAL HIGH (ref 70–99)
Glucose, Bld: 154 mg/dL — ABNORMAL HIGH (ref 70–99)
Phosphorus: 2.2 mg/dL — ABNORMAL LOW (ref 2.5–4.6)
Phosphorus: 3.1 mg/dL (ref 2.5–4.6)
Potassium: 3.5 mmol/L (ref 3.5–5.1)
Potassium: 3.7 mmol/L (ref 3.5–5.1)
Sodium: 145 mmol/L (ref 135–145)
Sodium: 144 mmol/L (ref 135–145)

## 2018-12-25 LAB — POCT I-STAT, CHEM 8
BUN: 22 mg/dL (ref 8–23)
BUN: 21 mg/dL (ref 8–23)
BUN: 26 mg/dL — ABNORMAL HIGH (ref 8–23)
BUN: 22 mg/dL (ref 8–23)
BUN: 25 mg/dL — ABNORMAL HIGH (ref 8–23)
Calcium, Ion: 0.35 mmol/L — CL (ref 1.15–1.40)
Calcium, Ion: 0.36 mmol/L — CL (ref 1.15–1.40)
Calcium, Ion: 1.08 mmol/L — ABNORMAL LOW (ref 1.15–1.40)
Calcium, Ion: 1.03 mmol/L — ABNORMAL LOW (ref 1.15–1.40)
Calcium, Ion: <0.3 mmol/L — CL (ref 1.15–1.40)
Chloride: 80 mmol/L — ABNORMAL LOW (ref 98–111)
Chloride: 80 mmol/L — ABNORMAL LOW (ref 98–111)
Chloride: 81 mmol/L — ABNORMAL LOW (ref 98–111)
Chloride: 80 mmol/L — ABNORMAL LOW (ref 98–111)
Chloride: 83 mmol/L — ABNORMAL LOW (ref 98–111)
Creatinine, Ser: 0.7 mg/dL (ref 0.44–1.00)
Creatinine, Ser: 0.6 mg/dL (ref 0.44–1.00)
Creatinine, Ser: 0.8 mg/dL (ref 0.44–1.00)
Creatinine, Ser: 0.6 mg/dL (ref 0.44–1.00)
Creatinine, Ser: 0.7 mg/dL (ref 0.44–1.00)
Glucose, Bld: 217 mg/dL — ABNORMAL HIGH (ref 70–99)
Glucose, Bld: 148 mg/dL — ABNORMAL HIGH (ref 70–99)
Glucose, Bld: 237 mg/dL — ABNORMAL HIGH (ref 70–99)
Glucose, Bld: 155 mg/dL — ABNORMAL HIGH (ref 70–99)
Glucose, Bld: 258 mg/dL — ABNORMAL HIGH (ref 70–99)
HCT: 23 % — ABNORMAL LOW (ref 36.0–46.0)
HCT: 20 % — ABNORMAL LOW (ref 36.0–46.0)
HCT: 22 % — ABNORMAL LOW (ref 36.0–46.0)
HCT: 19 % — ABNORMAL LOW (ref 36.0–46.0)
HCT: 22 % — ABNORMAL LOW (ref 36.0–46.0)
Hemoglobin: 7.8 g/dL — ABNORMAL LOW (ref 12.0–15.0)
Hemoglobin: 6.8 g/dL — CL (ref 12.0–15.0)
Hemoglobin: 7.5 g/dL — ABNORMAL LOW (ref 12.0–15.0)
Hemoglobin: 6.5 g/dL — CL (ref 12.0–15.0)
Hemoglobin: 7.5 g/dL — ABNORMAL LOW (ref 12.0–15.0)
Potassium: 3.3 mmol/L — ABNORMAL LOW (ref 3.5–5.1)
Potassium: 3.2 mmol/L — ABNORMAL LOW (ref 3.5–5.1)
Potassium: 3.4 mmol/L — ABNORMAL LOW (ref 3.5–5.1)
Potassium: 3.5 mmol/L (ref 3.5–5.1)
Potassium: 3.5 mmol/L (ref 3.5–5.1)
Sodium: 140 mmol/L (ref 135–145)
Sodium: 139 mmol/L (ref 135–145)
Sodium: 139 mmol/L (ref 135–145)
Sodium: 140 mmol/L (ref 135–145)
Sodium: 140 mmol/L (ref 135–145)
TCO2: 47 mmol/L — ABNORMAL HIGH (ref 22–32)
TCO2: 44 mmol/L — ABNORMAL HIGH (ref 22–32)
TCO2: 50 mmol/L — ABNORMAL HIGH (ref 22–32)
TCO2: 44 mmol/L — ABNORMAL HIGH (ref 22–32)
TCO2: 48 mmol/L — ABNORMAL HIGH (ref 22–32)

## 2018-12-25 LAB — MAGNESIUM: Magnesium: 1.8 mg/dL (ref 1.7–2.4)

## 2018-12-25 LAB — GLUCOSE, CAPILLARY
Glucose-Capillary: 117 mg/dL — ABNORMAL HIGH (ref 70–99)
Glucose-Capillary: 122 mg/dL — ABNORMAL HIGH (ref 70–99)
Glucose-Capillary: 147 mg/dL — ABNORMAL HIGH (ref 70–99)
Glucose-Capillary: 156 mg/dL — ABNORMAL HIGH (ref 70–99)
Glucose-Capillary: 160 mg/dL — ABNORMAL HIGH (ref 70–99)

## 2018-12-25 LAB — CBC
HCT: 24.4 % — ABNORMAL LOW (ref 36.0–46.0)
Hemoglobin: 7.3 g/dL — ABNORMAL LOW (ref 12.0–15.0)
MCH: 30.3 pg (ref 26.0–34.0)
MCHC: 29.9 g/dL — ABNORMAL LOW (ref 30.0–36.0)
MCV: 101.2 fL — ABNORMAL HIGH (ref 80.0–100.0)
Platelets: 271 10*3/uL (ref 150–400)
RBC: 2.41 MIL/uL — ABNORMAL LOW (ref 3.87–5.11)
RDW: 14.9 % (ref 11.5–15.5)
WBC: 20.1 10*3/uL — ABNORMAL HIGH (ref 4.0–10.5)
nRBC: 0.1 % (ref 0.0–0.2)

## 2018-12-25 LAB — CALCIUM, IONIZED: Calcium, Ionized, Serum: 5 mg/dL (ref 4.5–5.6)

## 2018-12-25 MED ORDER — MAGNESIUM SULFATE 2 GM/50ML IV SOLN
2.0000 g | Freq: Once | INTRAVENOUS | Status: AC
  Administered 2018-12-25: 2 g via INTRAVENOUS
  Filled 2018-12-25: qty 50

## 2018-12-25 MED ORDER — STERILE WATER FOR INJECTION IV SOLN
INTRAVENOUS | Status: AC
  Administered 2018-12-25: 17:00:00 via INTRAVENOUS
  Filled 2018-12-25: qty 1320

## 2018-12-25 MED ORDER — POTASSIUM PHOSPHATES 15 MMOLE/5ML IV SOLN
15.0000 mmol | Freq: Once | INTRAVENOUS | Status: AC
  Administered 2018-12-25: 15 mmol via INTRAVENOUS
  Filled 2018-12-25: qty 5

## 2018-12-25 NOTE — Progress Notes (Addendum)
NAME:  Carmen Stone, MRN:  [id number], DOB:  [birth year], LOS: 47 ADMISSION DATE:  11/22/2018, CONSULTATION DATE:  11/21/2018 REFERRING MD:  Dr. Ninfa Linden, Surgery, CHIEF COMPLAINT:  Abdominal pain   Brief History   63 y/o female presented to ER 11/17/2018 with nausea, vomiting, abdominal pain.  CT abd/pelvis showed focal perforated loop of SB with free air with multiple complex ventral hernias.  Underwent emergent laparotomy with SB resection, lysis of adhesions and wound vac.  Remained on vent and pressors post op.  Hospital course complicated by respiratory arrest 6/13, multiple reintubations, intermittent vasopressor needs, AKI on CVVHD.     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 Changing volume goal on CRRT to -100 cc an hour. Weaned 7 hours PSV   6/16 Encephalopathic. Hypertensive, adding labetalol.  IV TNA started 6/17 A little more awake.  Tolerating PSV but SBI exceeds 105.  Volume removal with CVVHD 6/18 Neuro status improved.  Required brief vasoactive drips.  Net negative fluid volume status.PSV 6/19 Extubated 6/20 reintubated for altered mentation, head CT negative  6/21 Tx PRBC x1.  Off vasopressors 6/23 Febrile overnight, Blood Cx. Transfuse 1 U PRBC 6/24 Afebrile. Remains off pressors. 100cc/hr removal /c CRRT. 30% FiO2.  6/25 L IJ, subclavian, axillary  + DVT. Heparin initiated. Remains on CRRT.  6/26  CT ABD/pelvis positive for free air and ascites. Back to OR  for ex lap of perforated viscous, resection of previous anastomosis, application of wound vac.  1U RBCs intra op.   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    CT Chest 6/20 >> small bilateral pleural effusions with compressive atelectasis, anterior R 4-7 rib fractures CT ABD/Pelvis 6/20 >> small bowel surgical changes, mild circumferential wall thickening of scattered small bowel loops, small to moderate ascites LUE Duplex 6/24 >> DVT L IJ, subclavian, axillary, SVT L cephalic  CT ABD/Pelvis 1/63 >> large volume ascites with intraperitoneal free air   Micro Data:  COVID 6/07 >> negative Tracheal aspirate 6/20 >> E Coli sensitive to zosyn BCx2 6/23 >> negative BCx2  6/26 >> negative   Antimicrobials:  Zosyn 6/07 >> 6/17 Diflucan 6/07 >> 6/10 Cefepime 6/21 >>6/26 Vanco 6/21 >>  6/25 Anidulafungin 6/26 >> 7/06 Zosyn 6/26 >>7/10  Interim history/subjective:  Less responsive today, will grimace to pain that's about it.  Objective   Blood pressure 131/60, pulse (!) 114, temperature 98.4 F (36.9 C), temperature source Axillary, resp. rate 18, height 5\' 4"  (1.626 m), weight 65 kg, SpO2 100 %.    Vent Mode: PRVC FiO2 (%):   [30 %] 30 % Set Rate:  [18 bmp] 18 bmp Vt Set:  [430 mL] 430 mL PEEP:  [5 cmH20] 5 cmH20 Plateau Pressure:  [18 cmH20-22 cmH20] 19 cmH20   Intake/Output Summary (Last 24 hours) at 12/25/2018 0737 Last data filed at 12/25/2018 0600 Gross per 24 hour  Intake 5384.71 ml  Output 4509 ml  Net 875.71 ml   Filed Weights   12/23/18 0452 12/24/18 0500 12/25/18 0500  Weight: 65.2 kg 64.3 kg 65 kg   Physical Exam: GEN: ill appearing woman in NAD HEENT: ETT in place minimal secretions CV: tachycardic, ext warm PULM: diminished bases, no accessory muscle use GI: soft, drain in place with stool coming out EXT: no edema NEURO: grimaces but does with withdraw from pain PSYCH: RASS -5 SKIN: No rashes   Resolved Hospital Problem list   Metabolic alkalosis, Abdominal peritonitis with septic shock, Circulatory shock, Cardiorespiratory arrest 6/13, Shock liver, Hypophosphatemia, thrombocytopenia  Assessment & Plan:  63 year old critically ill female who remains in the ICU for multiorgan failure related to small bowel perforation s/p multiple attempts at repair, washout, and closure.  She unfortunately has developed recurrent leak without further surgical options.  In addition to this terminal diagnosis, she has a number of other hospital-acquired conditions detailed in other notes.  She is approaching end of life and we are prolonging her suffering.   - I called brother this AM, we agreed that in the event of cardiac arrest, we would not do chest compressions or shocks. He is coming in today and I would encourage all team members to emphasize the degree of suffering Nakeita is currently dealing with daily as a result of continued intervention.  She should be allowed to go on her own terms without [place] continuing this pain.  - Vent, CRRT, drains in interim  - See orders for remainder of care   Best practice:  Diet: TPN. DVT prophylaxis: SCDs GI prophylaxis: Protonix Mobility: Bedrest Code  Status: Full code Disposition: ICU.    The patient is critically ill with multiple organ systems failure and requires high complexity decision making for assessment and support, frequent evaluation and titration of therapies, application of advanced monitoring technologies and extensive interpretation of multiple databases. Critical Care Time devoted to patient care services described in this note independent of APP/resident time (if applicable)  is 34 minutes.   Erskine Emery MD [facility] 12/25/18 7:43 AM Personal pager: [phone] If unanswered, please page CCM On-call: [phone]

## 2018-12-25 NOTE — Progress Notes (Signed)
Pause in CRRT therapy due to delay in pharmacy with mixing of sodium Citrate. Blood recirculated from 15:40-16:02 and restarted with new bag of sodium citrate. Calcium gluconate was held during this time as well. CRRT therapy restarted without complication. Will continue to monitor.

## 2018-12-25 NOTE — Progress Notes (Signed)
PHARMACY - ADULT TOTAL PARENTERAL NUTRITION CONSULT NOTE   Pharmacy Consult for TPN Indication: prolonged ileus  HPI: [name] admitted 6/7 with perforated small bowel from closed-loop obstruction and underwent emergent exploratory laparotomy. Returned to OR on 6/9 and again on 6/14 for closure of abdomen.  Patient coded on 11-29-2022 and developed multiorgan failure. Remains intubated & on CRRT. Pressors have been weaned off and she is completing antibiotic course for sepsis/PNA.  NPO since admission on 6/7. Pharmacy consulted to start TPN 6/16. D/t acute liver injury from shock, initiated TPN at low rate & advance slowly as requested by surgery.  Patient Measurements: Height: '5\' 4"'  (162.6 cm) Weight: 143 lb 4.8 oz (65 kg) IBW/kg (Calculated) : 54.7 TPN AdjBW (KG): 59.7 Body mass index is 24.6 kg/m. Usual Weight: 90kg  Significant events:  6/18: OK to increase to goal rate per surgery 6/22: CRRT stopped for holiday; CBG 69 overnight, D50 given but TPN continued (55 units insulin in TPN); BS and BM overnight - initiating trickle feeds 6/23: resuming CRRT, return to high-protein TPN; fevers overnight, CCM removing some central lines; will use chemo port for TPN 6/24: began adding lipids to TPN formula 6/25: trickle feeds stopped d/t abd distention 6/26: CRRT clotted off overnight; patient to OR this AM for ex lap; RN attempting to restart CRRT post-op but still having issues at this time 6/27: CRRT appears to have been successfully resumed overnight 6/29 OR pus throughout abdomen. Small hole in small bowel 7/3 OR wash out of abd, replace wound VAC, abd remains open 7/6 sched for OR today for wash out abd, replace wound VAC 7/8 new leak at surgical site; patient is not a candidate for additional surgeries. To continue TPN pending [place] discussion  Central access: 12/04/2018 TPN start date: 11/30/18  ASSESSMENT                                                                                                           Current Nutrition: NPO IVF: On CRRT to keep net neutral I/O  Today:   Glucose (goal 100-150) - Hx DM on 70/30 Novolog 20 units BID PTA.  CBGs improved with increased insulin in TPN (range 156-162)  55 units insulin in TPN + ICU resistant scale; 27 units SSI required yesterday  Electrolytes - on CRRT; Na WNL; continues to lose Cl; K, Phos, Mg remain borderline low; corrected Ca elevated  Renal - on CRRT; SCr, BUN following; bicarb elevated and continues to climb (suspect Cl depletion alkalosis)  LFTs - albumin remains low; alk phos still mildly elevated and trending up, others WNL (7/9)  TGs - WNL (7/6)  Prealbumin - remains low but improving (7/6)  NUTRITIONAL GOALS  RD recs: (6/29):  Kcal:1493 kcal, Protein:195-215 gram Fluid:>/= 1.8 L/day  PLAN                                                                                                                         Mg 2g IV x1 KPhos 15 mmol IV x 1 KCl   At 1800 today:   Continue TPN at 75 ml/hr   Custom TPN with lipids at goal rate of 75 ml/hr provides: 198 g/day protein, 1494 Kcal/day = 100% support  Electrolytes: increase K, Phos already maxed out, remove Ca (will let this be controlled by Citrate & Ca infusion titration); Cl:Ac = max Cl  TPN to contain standard MVI daily, trace elements MWF only due to national backorder.  IVF per MD - CRRT fluids  Continue ICU resistant-scale glycemic control orders q6h; increase to 55 units regular insulin in TPN  Patient has had episodes of hypoglycemia previously; low threshold to pause TPN if becoming hypoglycemic  TPN lab panels Mondays & Thursdays  Ordered BID renal function panels and frequent iStat labs while on CRRT  Reuel Boom, PharmD, BCPS [phone] 12/25/2018, 7:30 AM

## 2018-12-25 NOTE — Progress Notes (Signed)
Daily Progress Note   Patient Name: Carmen Stone       Date: 12/25/2018 DOB: 08-Feb-1956  Age: 63 y.o. MRN#: [id number] Attending Physician: Nolon Nations, MD Primary Care Physician: Carmen Booze, NP Admit Date: 11/24/2018  Reason for Consultation/Follow-up: Establishing goals of care  Subjective:   Carmen Stone is resting in bed, she appears chronically ill, doesn't arouse or engage.   I met with her daughter and her brother, Carmen Stone are are at the bedside.  Length of Stay: 34  Current Medications: Scheduled Meds:  . chlorhexidine gluconate (MEDLINE KIT)  15 mL Mouth Rinse BID  . Chlorhexidine Gluconate Cloth  6 each Topical Daily  . dorzolamide-timolol  1 drop Both Eyes BID  . insulin aspart  3-9 Units Subcutaneous Q6H  . levothyroxine  12.5 mcg Intravenous Daily  . mouth rinse  15 mL Mouth Rinse 10 times per day  . pantoprazole (PROTONIX) IV  40 mg Intravenous Q24H    Continuous Infusions: .  prismasol BGK 4/2.5 200 mL/hr at 12/24/18 2210  . sodium chloride Stopped (12/25/18 0957)  . calcium gluconate infusion for CRRT 140 mL/hr at 12/25/18 0600  . fentaNYL infusion INTRAVENOUS 150 mcg/hr (12/25/18 1000)  . potassium PHOSPHATE IVPB (in mmol) 15 mmol (12/25/18 1016)  . prismasol B22GK 4/0 1,500 mL/hr at 12/25/18 0823  . sodium citrate 2 %/dextrose 2.5% solution 3000 mL 250 mL/hr at 12/25/18 0658  . TPN ADULT (ION) 75 mL/hr at 12/25/18 1000  . TPN ADULT (ION)      PRN Meds: sodium chloride, albuterol, alteplase, fentaNYL, heparin, hydrALAZINE, metoprolol tartrate, sodium chloride, sodium chloride flush  Physical Exam         General: Intubated.  Furrows her brows together when her name is called. Heart: Regular rate and rhythm. No murmur appreciated. Lungs: Good air  movement on vent. Abdomen:  has eakins pouch Ext: No significant edema Skin: Warm and dry  Vital Signs: BP 129/60   Pulse (!) 109   Temp 98.3 F (36.8 C) (Oral)   Resp 18   Ht _0  (1.626 m)   Wt 65 kg   SpO2 100%   BMI 24.60 kg/m  SpO2: SpO2: 100 % O2 Device: O2 Device: Ventilator O2 Flow Rate: O2 Flow Rate (L/min): 4 L/min  Intake/output summary:   Intake/Output Summary (  Last 24 hours) at 12/25/2018 1102 Last data filed at 12/25/2018 1000 Gross per 24 hour  Intake 5445.34 ml  Output 4308 ml  Net 1137.34 ml   LBM: Last BM Date: 12/25/18 Baseline Weight: Weight: 74.8 kg Most recent weight: Weight: 65 kg       Palliative Assessment/Data:    Flowsheet Rows     Most Recent Value  Intake Tab  Referral Department  Surgery  Unit at Time of Referral  ICU  Palliative Care Primary Diagnosis  Sepsis/Infectious Disease  Date Notified  12/06/2018  Palliative Care Type  New Palliative care  Reason for referral  Clarify Goals of Care  Date of Admission  12/02/2018  Date first seen by Palliative Care  12/14/18  # of days Palliative referral response time  1 Day(s)  # of days IP prior to Palliative referral  22  Clinical Assessment  Palliative Performance Scale Score  10%  Psychosocial & Spiritual Assessment  Palliative Care Outcomes  Palliative Care Outcomes  Clarified goals of care      Patient Active Problem List   Diagnosis Date Noted  . Dyspnea   . Palliative care by specialist   . Goals of care, counseling/discussion   . Acute respiratory failure with hypoxia and hypercapnia ([place]) 12/12/2018  . Toxic metabolic encephalopathy 66/29/4765  . Thrombocytopenia, acquired ([place]) 12/12/2018  . Small bowel perforation ([place])   . Pressure injury of skin 11/29/2018  . Encounter for central line placement   . Septic shock ([place])   . AKI (acute kidney injury) ([place])   . S/P dialysis catheter insertion ([place])   . 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 Perforated viscus 11/30/2018  . Port-A-Cath in place 08/03/2017  . Port catheter in place 01/31/2016  . Hypersensitivity reaction 02/08/2014  . Drug induced neutropenia(288.03) 01/30/2014  . Nausea with vomiting 11/04/2013  . 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 ATN (acute tubular necrosis) 11/02/2013  . Acute respiratory failure ([place]) 11/02/2013  . Acute respiratory failure with hypoxia ([place]) 10/25/2013  . Abnormal urinalysis 10/25/2013  . Postoperative anemia due to acute blood loss 10/24/2013  . Breast abscess of female 10/22/2013  . HYPERLIPIDEMIA 07/16/2010  . ATELECTASIS 07/16/2010  . Type 2 diabetes mellitus ([place]) 06/24/2010  . 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 HYPOXEMIA 06/24/2010  . SMALL BOWEL OBSTRUCTION, HX OF 06/24/2010    Palliative Care Assessment & Plan   Patient Profile: 63 y.o. female  with past medical history of  admitted on 12/01/2018 with abdominal pain from perforation and complicated clinical course with multiple surgeries requiring resection of large portion of small bowel, code blue, and multisystem organ failure (EF 20%, on CRRT and vent dependent).     Assessment: Patient Active Problem List   Diagnosis Date Noted  . Dyspnea   . Palliative care by specialist   . Goals of care, counseling/discussion   . Acute respiratory failure with hypoxia and hypercapnia ([place]) 12/12/2018  . Toxic metabolic encephalopathy 46/50/3546  . Thrombocytopenia, acquired ([place]) 12/12/2018  . Small bowel perforation ([place])   . Pressure injury of skin 11/29/2018  . Encounter for central line placement   . Septic shock ([place])   . AKI (acute kidney injury) ([place])   . S/P dialysis catheter insertion ([place])   . 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 Perforated viscus 12/02/2018  . Port-A-Cath in place 08/03/2017  . Port catheter in place 01/31/2016  . Hypersensitivity reaction 02/08/2014  .  Drug induced  neutropenia(288.03) 01/30/2014  . Nausea with vomiting 11/04/2013  . 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 ATN (acute tubular necrosis) 11/02/2013  . Acute respiratory failure ([place]) 11/02/2013  . Acute respiratory failure with hypoxia ([place]) 10/25/2013  . Abnormal urinalysis 10/25/2013  . Postoperative anemia due to acute blood loss 10/24/2013  . Breast abscess of female 10/22/2013  . HYPERLIPIDEMIA 07/16/2010  . ATELECTASIS 07/16/2010  . Type 2 diabetes mellitus ([place]) 06/24/2010  . 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 HYPOXEMIA 06/24/2010  . SMALL BOWEL OBSTRUCTION, HX OF 06/24/2010    Recommendations/Plan: Agree with DNR, Mr Carmen Stone recalls having this conversation with PCCM attending earlier today.   We re discussed that Ms Rettig is soon approaching end of life, in spite of all of her current care. Again brought up about one way extubation and full scope of comfort measures.  Family states that they are thankful for the chance to come visit with patient and that they are thankful for the information provided. They are continuing to discuss about comfort care and one way extubation with their extended family. Other than DNR, they wish to continue current measures, including the vent.   Offered active listening and supportive care. All of their questions addressed to the best of my ability.      Code Status:    Code Status Orders  (From admission, onward)         Start     Ordered   12/03/2018 1515  Full code  Continuous     11/22/2018 1515        Code Status History    Date Active Date Inactive Code Status Order ID Comments User Context   11/19/2018 1512 11/29/2018 1515 Full Code [id number]  Chesley Mires, MD Inpatient   Advance Care Planning Activity      Now DNR Prognosis:   Poor  Discharge Planning:  Anticipated hospital death.   Care plan was discussed with brother, bedside RN and daughter.    Thank you for allowing the Palliative Medicine Team to assist in  the care of this patient.   Time In: 10 Time Out: 10.25 Total Time 25 Prolonged Time Billed No      Greater than 50%  of this time was spent counseling and coordinating care related to the above assessment and plan.  Loistine Chance, MD [phone] Please contact Palliative Medicine Team phone at [phone] for questions and concerns.

## 2018-12-25 NOTE — Progress Notes (Signed)
[facility] ROUNDING NOTE   I/O even yesterday   Objective:  Vital signs in last 24 hours:  Temp:  [98.3 F (36.8 C)-98.8 F (37.1 C)] 98.3 F (36.8 C) (07/11 0800) Pulse Rate:  [108-134] 109 (07/11 1100) Resp:  [13-26] 18 (07/11 1100) BP: (93-139)/(43-74) 122/56 (07/11 1100) SpO2:  [98 %-100 %] 100 % (07/11 1100) FiO2 (%):  [30 %] 30 % (07/11 0812) Weight:  [65 kg] 65 kg (07/11 0500)  Weight change: 0.7 kg Filed Weights   12/23/18 0452 12/24/18 0500 12/25/18 0500  Weight: 65.2 kg 64.3 kg 65 kg    Intake/Output: I/O last 3 completed shifts: In: 8902.2 [I.V.:8336.4; IV Piggyback:565.9] Out: 2130 [Urine:709; Emesis/NG output:435; QMVHQ:4696; EXBMW:4132]   Intake/Output this shift:  Total I/O In: 1009.3 [I.V.:929.3; IV Piggyback:80] Out: 440 [Urine:100; Other:596; Stool:5]  Exam: Intubated and sedated CVS- RRR no JVP RS- CTA ant and lat ABD- BS present soft non-distended EXT-1-2+ lower extremity edema   Assessment/ Plan:   Acute kidney injury/ ATN: with very little signs of recovery.  She has required CRRT since 11/20/2018.  Baseline serum creatinine 0.9.  Acute kidney injury following perforated small bowel with fecal peritonitis and septic shock. Now has no surgical options and unable to survive this illness. Dialysis currently is prolonging her suffering. Support transition to comfort care and withdrawal of dialysis/ CRRT.    Perforated small bowel - multiple surgeries w/ significant SB resection  Hypotension/volume: no vol excess on exam, well under dry wt. Getting TNA w/ volume load.   Anemia - transfuse prn  CM EF 20%  Diabetes mellitus per primary team  Nutrition continues on TNA  Hypophosphatemia: resolved     Carmen Splinter, MD 12/25/2018, 11:15 AM     Basic Metabolic Panel: Recent Labs  Lab 12/21/18 0422  12/22/18 0415  12/23/18 0435  12/23/18 2134  12/24/18 0300  12/24/18 1600 12/24/18 1856 12/24/18 1901 12/25/18 0258  12/25/18 0335  NA 140   < > 138   < > 138   < > 141   < > 140   < > 143 142 143 145 140  K 3.3*   < > 4.4   < > 3.4*   < > 3.3*   < > 3.7   < > 3.3* 3.0* 2.7* 3.5 3.3*  CL 92*   < > 88*   < > 79*   < > 80*   < > 77*   < > 75* 77* 83* 81* 80*  CO2 34*   < > 35*   < > 41*  --  46*  --  44*  --  48*  --   --  49*  --   GLUCOSE 193*   < > 186*   < > 187*   < > 160*   < > 166*   < > 176* 226* 119* 124* 217*  BUN 36*   < > 29*   < > 33*   < > 34*   < > 32*   < > 32* 23 25* 31* 22  CREATININE 0.91   < > 0.74   < > 0.87   < > 0.91   < > 0.89   < > 0.89 0.70 0.80 0.79 0.70  CALCIUM 7.2*   < > 10.4*   < > 12.3*  --  10.7*  --  10.0  --  10.6*  --   --  10.0  --   MG 1.6*  --  2.0  --  1.8  --   --   --  2.0  --   --   --   --  1.8  --   PHOS 2.0*   < > 2.4*   < > 2.5  --  1.8*  --  2.7  --  2.7  --   --  2.2*  --    < > = values in this interval not displayed.    Liver Function Tests: Recent Labs  Lab 12/18/2018 0345  12/23/18 0435 12/23/18 2134 12/24/18 0300 12/24/18 1600 12/25/18 0258  AST 25  --  20  --   --   --   --   ALT 19  --  16  --   --   --   --   ALKPHOS 139*  --  178*  --   --   --   --   BILITOT 0.4  --  0.5  --   --   --   --   PROT 4.9*  --  5.6*  --   --   --   --   ALBUMIN 1.3*  1.4*   < > 1.2*  1.2* 1.2* 1.2* 1.2* 1.2*   < > = values in this interval not displayed.   No results for input(s): LIPASE, AMYLASE in the last 168 hours. No results for input(s): AMMONIA in the last 168 hours.  CBC: Recent Labs  Lab 01/06/2019 0345  12/21/18 0422  12/22/18 0415  12/23/18 0435  12/24/18 0300  12/24/18 1127 12/24/18 1856 12/24/18 1901 12/25/18 0258 12/25/18 0335  WBC 21.9*  --  20.7*  --  20.0*  --  19.6*  --  16.6*  --   --   --   --  20.1*  --   NEUTROABS 16.9*  --   --   --   --   --   --   --   --   --   --   --   --   --   --   HGB 9.2*   < > 8.7*   < > 8.1*   < > 7.5*   < > 7.5*   < > 8.2* 8.2* 7.5* 7.3* 7.8*  HCT 28.2*   < > 26.6*   < > 25.5*   < > 24.0*   < >  23.8*   < > 24.0* 24.0* 22.0* 24.4* 23.0*  MCV 90.7  --  95.3  --  97.0  --  97.2  --  98.8  --   --   --   --  101.2*  --   PLT 206  --  199  --  232  --  252  --  253  --   --   --   --  271  --    < > = values in this interval not displayed.     Medications:   .  prismasol BGK 4/2.5 200 mL/hr at 12/24/18 2210  . sodium chloride Stopped (12/25/18 0957)  . calcium gluconate infusion for CRRT 140 mL/hr at 12/25/18 0600  . fentaNYL infusion INTRAVENOUS 150 mcg/hr (12/25/18 1100)  . potassium PHOSPHATE IVPB (in mmol) 43 mL/hr at 12/25/18 1100  . prismasol B22GK 4/0 1,500 mL/hr at 12/25/18 0823  . sodium citrate 2 %/dextrose 2.5% solution 3000 mL 250 mL/hr at 12/25/18 0658  . TPN ADULT (ION) 75 mL/hr at 12/25/18 1100  . TPN ADULT (ION)     .  chlorhexidine gluconate (MEDLINE KIT)  15 mL Mouth Rinse BID  . Chlorhexidine Gluconate Cloth  6 each Topical Daily  . dorzolamide-timolol  1 drop Both Eyes BID  . insulin aspart  3-9 Units Subcutaneous Q6H  . levothyroxine  12.5 mcg Intravenous Daily  . mouth rinse  15 mL Mouth Rinse 10 times per day  . pantoprazole (PROTONIX) IV  40 mg Intravenous Q24H   sodium chloride, albuterol, alteplase, fentaNYL, heparin, hydrALAZINE, metoprolol tartrate, sodium chloride, sodium chloride flush

## 2018-12-25 NOTE — Progress Notes (Signed)
CRRT stopped at 2040 and blood returned to patient and filter set recirculated with NS. PBP line showed air that was unable to be primed out during recirculation priming process. Filter was discarded and a new filter set was attached. CRRT restarted at 2145 without incident.

## 2018-12-25 NOTE — Progress Notes (Signed)
[facility] Office:  [phone] General Surgery Progress Note   LOS: 34 days  POD -  5 Days Post-Op  Chief Complaint: Abdominal pain  Assessment and Plan: 1.  EXPLORATORY LAPAROTOMY WITH WOUND WASHOUT AND SMALL BOWEL ANASTOMOSIS - 12/18/2018 - [place]  Has leak  There are no further surgical options  1A.  Expl lap, wash out of abdomen, wound VAC - 01/04/2019 - [facility] 1B.  Multiple recent operations - 6/7 Ninfa Linden, 6/9 Rosendo Gros, 6/11 and 6/14 - [place], 6/26 Marlou Starks, 6/29 - [place] 1C..  Initial Operation 11/30/2018 - Exp lap, small bowel resection, lysis of adhesions, wound VAC - [facility]  2.  Respiratory failure - on vent 3.  On CVVH 4.  DM 5.  Severe malnutrition - Pre albumn - 9.3 on 12/18/2018  On TPN 6.  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 11/26/2018 Active Problems:   Perforated viscus   Encounter for central line placement   Septic shock ([place])   AKI (acute kidney injury) ([place])   S/P dialysis catheter insertion ([place])   Pressure injury of skin   Small bowel perforation ([place])   Acute respiratory failure with hypoxia and hypercapnia ([place])   Toxic metabolic encephalopathy   Thrombocytopenia, acquired ([place])   Dyspnea   Palliative care by specialist   Goals of care, counseling/discussion   Subjective:  Intubated, sedated  Objective:   Vitals:   12/25/18 0800 12/25/18 0812  BP: 132/60   Pulse: (!) 118   Resp: 18   Temp:    SpO2: 100% 100%     Intake/Output from previous day:  07/10 0701 - 07/11 0700 In: 5619.6 [I.V.:5599.2; IV Piggyback:20.4] Out: 4625 [Urine:546; Emesis/NG output:265; Stool:838]  Intake/Output this shift:  Total I/O In: 234.9 [I.V.:234.9] Out: 133 [Urine:10; Other:118; Stool:5]   Physical Exam:   General: Older AA F who is intubated   HEENT: Normal. Pupils equal.   Abdomen: Eakins pouch with drainage.     Lab Results:    Recent Labs   12/24/18 0300  12/25/18 0258 12/25/18 0335  WBC 16.6*  --  20.1*  --   HGB 7.5*   < > 7.3* 7.8*  HCT 23.8*   < > 24.4* 23.0*  PLT 253  --  271  --    < > = values in this interval not displayed.    BMET   Recent Labs    12/24/18 1600  12/25/18 0258 12/25/18 0335  NA 143   < > 145 140  K 3.3*   < > 3.5 3.3*  CL 75*   < > 81* 80*  CO2 48*  --  49*  --   GLUCOSE 176*   < > 124* 217*  BUN 32*   < > 31* 22  CREATININE 0.89   < > 0.79 0.70  CALCIUM 10.6*  --  10.0  --    < > = values in this interval not displayed.    PT/INR  No results for input(s): LABPROT, INR in the last 72 hours.  ABG  No results for input(s): PHART, HCO3 in the last 72 hours.  Invalid input(s): PCO2, PO2   Studies/Results:  No results found.   Anti-infectives:   Anti-infectives (From admission, onward)   Start     Dose/Rate Route Frequency Ordered Stop   11/24/2018 1030  clindamycin (CLEOCIN) 900 mg, gentamicin (GARAMYCIN) 240 mg in sodium chloride 0.9 %  1,000 mL for intraperitoneal lavage      Irrigation To Surgery 12/05/2018 1026 12/07/2018 1042   12/11/18 1500  anidulafungin (ERAXIS) 100 mg in sodium chloride 0.9 % 100 mL IVPB  Status:  Discontinued     100 mg 78 mL/hr over 100 Minutes Intravenous Every 24 hours 12/04/2018 1007 12/27/2018 1103   12/11/18 0600  piperacillin-tazobactam (ZOSYN) IVPB 3.375 g  Status:  Discontinued     3.375 g 100 mL/hr over 30 Minutes Intravenous Every 6 hours 12/11/18 0317 12/24/18 0737   12/09/2018 1800  piperacillin-tazobactam (ZOSYN) IVPB 2.25 g  Status:  Discontinued     2.25 g 100 mL/hr over 30 Minutes Intravenous Every 6 hours 12/02/2018 1413 12/11/18 0317   12/02/2018 1100  anidulafungin (ERAXIS) 200 mg in sodium chloride 0.9 % 200 mL IVPB     200 mg 78 mL/hr over 200 Minutes Intravenous  Once 11/26/2018 1007 11/18/2018 1456   11/20/2018 1100  piperacillin-tazobactam (ZOSYN) IVPB 3.375 g     3.375 g 100 mL/hr over 30 Minutes Intravenous  Once 11/24/2018 1027 12/02/2018 1245    12/08/18 1800  vancomycin (VANCOCIN) IVPB 750 mg/150 ml premix  Status:  Discontinued     750 mg 150 mL/hr over 60 Minutes Intravenous Every 48 hours 12/07/18 0822 12/07/18 0903   12/08/18 1000  ceFEPIme (MAXIPIME) 2 g in sodium chloride 0.9 % 100 mL IVPB  Status:  Discontinued     2 g 200 mL/hr over 30 Minutes Intravenous Every 24 hours 12/07/18 0822 12/07/18 0903   12/07/18 2200  ceFEPIme (MAXIPIME) 2 g in sodium chloride 0.9 % 100 mL IVPB  Status:  Discontinued     2 g 200 mL/hr over 30 Minutes Intravenous Every 12 hours 12/07/18 0903 12/11/2018 1027   12/07/18 1800  vancomycin (VANCOCIN) IVPB 750 mg/150 ml premix  Status:  Discontinued     750 mg 150 mL/hr over 60 Minutes Intravenous Every 24 hours 12/07/18 0903 12/09/18 1040   12/06/18 1800  vancomycin (VANCOCIN) IVPB 750 mg/150 ml premix  Status:  Discontinued     750 mg 150 mL/hr over 60 Minutes Intravenous Every 24 hours 12/05/18 1652 12/07/18 0822   12/05/18 1800  ceFEPIme (MAXIPIME) 2 g in sodium chloride 0.9 % 100 mL IVPB  Status:  Discontinued     2 g 200 mL/hr over 30 Minutes Intravenous Every 12 hours 12/05/18 1644 12/07/18 0822   12/05/18 1700  vancomycin (VANCOCIN) 1,250 mg in sodium chloride 0.9 % 250 mL IVPB     1,250 mg 166.7 mL/hr over 90 Minutes Intravenous  Once 12/05/18 1652 12/05/18 2019   11/16/2018 1800  fluconazole (DIFLUCAN) IVPB 200 mg  Status:  Discontinued     200 mg 100 mL/hr over 60 Minutes Intravenous Every 24 hours 11/26/2018 0748 12/03/2018 0847   12/05/2018 1800  fluconazole (DIFLUCAN) IVPB 400 mg  Status:  Discontinued     400 mg 100 mL/hr over 120 Minutes Intravenous Every 24 hours 12/14/2018 0847 12/05/2018 0856   12/04/2018 1400  piperacillin-tazobactam (ZOSYN) IVPB 2.25 g  Status:  Discontinued     2.25 g 100 mL/hr over 30 Minutes Intravenous Every 6 hours 11/20/2018 0748 12/01/18 1104   11/26/2018 0600  cefoTEtan (CEFOTAN) 2 g in sodium chloride 0.9 % 100 mL IVPB     2 g 200 mL/hr over 30 Minutes Intravenous On  call to O.R. 11/22/18 1300 11/26/2018 0624   11/22/18 1800  fluconazole (DIFLUCAN) IVPB 400 mg  Status:  Discontinued  400 mg 100 mL/hr over 120 Minutes Intravenous Every 24 hours 11/17/2018 1528 11/30/2018 0748   11/22/18 1800  vancomycin (VANCOCIN) 1,500 mg in sodium chloride 0.9 % 500 mL IVPB  Status:  Discontinued     1,500 mg 250 mL/hr over 120 Minutes Intravenous Every 24 hours 11/22/18 0358 11/22/18 0750   11/22/18 0115  vancomycin (VANCOCIN) 1,500 mg in sodium chloride 0.9 % 500 mL IVPB     1,500 mg 250 mL/hr over 120 Minutes Intravenous  Once 11/22/18 0101 11/22/18 0319   11/18/2018 1630  fluconazole (DIFLUCAN) IVPB 800 mg     800 mg 100 mL/hr over 240 Minutes Intravenous  Once 11/15/2018 1527 12/14/2018 2013   12/09/2018 1600  piperacillin-tazobactam (ZOSYN) IVPB 3.375 g  Status:  Discontinued     3.375 g 12.5 mL/hr over 240 Minutes Intravenous Every 8 hours 12/12/2018 1522 12/06/2018 0748   11/30/2018 0545  piperacillin-tazobactam (ZOSYN) IVPB 3.375 g     3.375 g 100 mL/hr over 30 Minutes Intravenous  Once 12/09/2018 0530 12/05/2018 0626   12/04/2018 0530  piperacillin-tazobactam (ZOSYN) IVPB 4.5 g  Status:  Discontinued     4.5 g 200 mL/hr over 30 Minutes Intravenous  Once 11/16/2018 0517 12/06/2018 0529      Alphonsa Overall, MD, FACS Pager: [facility] Office: [phone] 12/25/2018

## 2018-12-25 NOTE — Progress Notes (Signed)
While turning patient for bath, 441mL of succus/liquid stool leaked from the patients abdominal incision into the Eakin pouch. Will monitor.

## 2018-12-25 NOTE — Progress Notes (Signed)
Location [facility]24  Petra Kuba of Visit: Pastoral Support  Summary:  Weekend Chaplain received this consult from [employer]. Upon arrival family of Pt was at bedside. Chaplain provided a listening ear and concluded the visit with prayer based on the family's prayer request at the time.   Next day chaplain stopped by to check on Pt.  Chaplain plans to follow up with family during the weekend.

## 2018-12-26 LAB — POCT I-STAT, CHEM 8
BUN: 23 mg/dL (ref 8–23)
BUN: 24 mg/dL — ABNORMAL HIGH (ref 8–23)
BUN: 20 mg/dL (ref 8–23)
BUN: 27 mg/dL — ABNORMAL HIGH (ref 8–23)
BUN: 22 mg/dL (ref 8–23)
BUN: 24 mg/dL — ABNORMAL HIGH (ref 8–23)
Calcium, Ion: 0.32 mmol/L — CL (ref 1.15–1.40)
Calcium, Ion: 1.01 mmol/L — ABNORMAL LOW (ref 1.15–1.40)
Calcium, Ion: 0.35 mmol/L — CL (ref 1.15–1.40)
Calcium, Ion: 1.09 mmol/L — ABNORMAL LOW (ref 1.15–1.40)
Calcium, Ion: 0.31 mmol/L — CL (ref 1.15–1.40)
Calcium, Ion: 1.02 mmol/L — ABNORMAL LOW (ref 1.15–1.40)
Chloride: 81 mmol/L — ABNORMAL LOW (ref 98–111)
Chloride: 87 mmol/L — ABNORMAL LOW (ref 98–111)
Chloride: 83 mmol/L — ABNORMAL LOW (ref 98–111)
Chloride: 85 mmol/L — ABNORMAL LOW (ref 98–111)
Chloride: 80 mmol/L — ABNORMAL LOW (ref 98–111)
Chloride: 87 mmol/L — ABNORMAL LOW (ref 98–111)
Creatinine, Ser: 0.6 mg/dL (ref 0.44–1.00)
Creatinine, Ser: 0.7 mg/dL (ref 0.44–1.00)
Creatinine, Ser: 0.6 mg/dL (ref 0.44–1.00)
Creatinine, Ser: 0.7 mg/dL (ref 0.44–1.00)
Creatinine, Ser: 0.6 mg/dL (ref 0.44–1.00)
Creatinine, Ser: 0.6 mg/dL (ref 0.44–1.00)
Glucose, Bld: 237 mg/dL — ABNORMAL HIGH (ref 70–99)
Glucose, Bld: 139 mg/dL — ABNORMAL HIGH (ref 70–99)
Glucose, Bld: 126 mg/dL — ABNORMAL HIGH (ref 70–99)
Glucose, Bld: 216 mg/dL — ABNORMAL HIGH (ref 70–99)
Glucose, Bld: 155 mg/dL — ABNORMAL HIGH (ref 70–99)
Glucose, Bld: 261 mg/dL — ABNORMAL HIGH (ref 70–99)
HCT: 26 % — ABNORMAL LOW (ref 36.0–46.0)
HCT: 20 % — ABNORMAL LOW (ref 36.0–46.0)
HCT: 26 % — ABNORMAL LOW (ref 36.0–46.0)
HCT: 27 % — ABNORMAL LOW (ref 36.0–46.0)
HCT: 19 % — ABNORMAL LOW (ref 36.0–46.0)
HCT: 26 % — ABNORMAL LOW (ref 36.0–46.0)
Hemoglobin: 8.8 g/dL — ABNORMAL LOW (ref 12.0–15.0)
Hemoglobin: 6.8 g/dL — CL (ref 12.0–15.0)
Hemoglobin: 8.8 g/dL — ABNORMAL LOW (ref 12.0–15.0)
Hemoglobin: 9.2 g/dL — ABNORMAL LOW (ref 12.0–15.0)
Hemoglobin: 6.5 g/dL — CL (ref 12.0–15.0)
Hemoglobin: 8.8 g/dL — ABNORMAL LOW (ref 12.0–15.0)
Potassium: 3.8 mmol/L (ref 3.5–5.1)
Potassium: 3.6 mmol/L (ref 3.5–5.1)
Potassium: 3.7 mmol/L (ref 3.5–5.1)
Potassium: 4.1 mmol/L (ref 3.5–5.1)
Potassium: 3.8 mmol/L (ref 3.5–5.1)
Potassium: 3.9 mmol/L (ref 3.5–5.1)
Sodium: 141 mmol/L (ref 135–145)
Sodium: 141 mmol/L (ref 135–145)
Sodium: 140 mmol/L (ref 135–145)
Sodium: 141 mmol/L (ref 135–145)
Sodium: 140 mmol/L (ref 135–145)
Sodium: 142 mmol/L (ref 135–145)
TCO2: 41 mmol/L — ABNORMAL HIGH (ref 22–32)
TCO2: 43 mmol/L — ABNORMAL HIGH (ref 22–32)
TCO2: 43 mmol/L — ABNORMAL HIGH (ref 22–32)
TCO2: 49 mmol/L — ABNORMAL HIGH (ref 22–32)
TCO2: 44 mmol/L — ABNORMAL HIGH (ref 22–32)
TCO2: 44 mmol/L — ABNORMAL HIGH (ref 22–32)

## 2018-12-26 LAB — RENAL FUNCTION PANEL
Albumin: 1.2 g/dL — ABNORMAL LOW (ref 3.5–5.0)
Albumin: 1.3 g/dL — ABNORMAL LOW (ref 3.5–5.0)
Anion gap: 16 — ABNORMAL HIGH (ref 5–15)
Anion gap: 18 — ABNORMAL HIGH (ref 5–15)
BUN: 31 mg/dL — ABNORMAL HIGH (ref 8–23)
BUN: 32 mg/dL — ABNORMAL HIGH (ref 8–23)
CO2: 44 mmol/L — ABNORMAL HIGH (ref 22–32)
CO2: 43 mmol/L — ABNORMAL HIGH (ref 22–32)
Calcium: 9.2 mg/dL (ref 8.9–10.3)
Calcium: 9.3 mg/dL (ref 8.9–10.3)
Chloride: 83 mmol/L — ABNORMAL LOW (ref 98–111)
Chloride: 82 mmol/L — ABNORMAL LOW (ref 98–111)
Creatinine, Ser: 0.73 mg/dL (ref 0.44–1.00)
Creatinine, Ser: 0.8 mg/dL (ref 0.44–1.00)
GFR calc Af Amer: >60 mL/min (ref >60)
GFR calc Af Amer: >60 mL/min (ref >60)
GFR calc non Af Amer: >60 mL/min (ref >60)
GFR calc non Af Amer: >60 mL/min (ref >60)
Glucose, Bld: 151 mg/dL — ABNORMAL HIGH (ref 70–99)
Glucose, Bld: 151 mg/dL — ABNORMAL HIGH (ref 70–99)
Phosphorus: 2.6 mg/dL (ref 2.5–4.6)
Phosphorus: 2.5 mg/dL (ref 2.5–4.6)
Potassium: 4.2 mmol/L (ref 3.5–5.1)
Potassium: 4.2 mmol/L (ref 3.5–5.1)
Sodium: 143 mmol/L (ref 135–145)
Sodium: 143 mmol/L (ref 135–145)

## 2018-12-26 LAB — CBC
HCT: 22.7 % — ABNORMAL LOW (ref 36.0–46.0)
Hemoglobin: 6.8 g/dL — CL (ref 12.0–15.0)
MCH: 30.5 pg (ref 26.0–34.0)
MCHC: 30 g/dL (ref 30.0–36.0)
MCV: 101.8 fL — ABNORMAL HIGH (ref 80.0–100.0)
Platelets: 287 10*3/uL (ref 150–400)
RBC: 2.23 MIL/uL — ABNORMAL LOW (ref 3.87–5.11)
RDW: 15.1 % (ref 11.5–15.5)
WBC: 24.7 10*3/uL — ABNORMAL HIGH (ref 4.0–10.5)
nRBC: 0.5 % — ABNORMAL HIGH (ref 0.0–0.2)

## 2018-12-26 LAB — GLUCOSE, CAPILLARY
Glucose-Capillary: 103 mg/dL — ABNORMAL HIGH (ref 70–99)
Glucose-Capillary: 130 mg/dL — ABNORMAL HIGH (ref 70–99)
Glucose-Capillary: 145 mg/dL — ABNORMAL HIGH (ref 70–99)
Glucose-Capillary: 175 mg/dL — ABNORMAL HIGH (ref 70–99)

## 2018-12-26 LAB — MAGNESIUM: Magnesium: 2 mg/dL (ref 1.7–2.4)

## 2018-12-26 MED ORDER — STERILE WATER FOR INJECTION IV SOLN
INTRAVENOUS | Status: DC
  Administered 2018-12-26: 18:00:00 via INTRAVENOUS
  Filled 2018-12-26: qty 1320

## 2018-12-26 NOTE — Progress Notes (Addendum)
NAME:  Carmen Stone, MRN:  [id number], DOB:  December 19, 1955, LOS: 80 ADMISSION DATE:  11/24/2018, CONSULTATION DATE:  11/21/2018 REFERRING MD:  Dr. Ninfa Linden, Surgery, CHIEF COMPLAINT:  Abdominal pain   Brief History   63 y/o female presented to ER 12/12/2018 with nausea, vomiting, abdominal pain.  CT abd/pelvis showed focal perforated loop of SB with free air with multiple complex ventral hernias.  Underwent emergent laparotomy with SB resection, lysis of adhesions and wound vac.  Remained on vent and pressors post op.  Hospital course complicated by respiratory arrest 6/13, multiple reintubations, intermittent vasopressor needs, AKI on CVVHD.     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 Changing volume goal on CRRT to -100 cc an hour. Weaned 7 hours PSV   6/16 Encephalopathic. Hypertensive, adding labetalol.  IV TNA started 6/17 A little more awake.  Tolerating PSV but SBI exceeds 105.  Volume removal with CVVHD 6/18 Neuro status improved.  Required brief vasoactive drips.  Net negative fluid volume status.PSV 6/19 Extubated 6/20 reintubated for altered mentation, head CT negative  6/21 Tx PRBC x1.  Off vasopressors 6/23 Febrile overnight, Blood Cx. Transfuse 1 U PRBC 6/24 Afebrile. Remains off pressors. 100cc/hr removal /c CRRT. 30% FiO2.  6/25 L IJ, subclavian, axillary  + DVT. Heparin initiated. Remains on CRRT.  6/26  CT ABD/pelvis positive for free air and ascites. Back to OR  for ex lap of perforated viscous, resection of previous anastomosis, application of wound vac.  1U RBCs intra op.   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    CT Chest 6/20 >> small bilateral pleural effusions with compressive atelectasis, anterior R 4-7 rib fractures CT ABD/Pelvis 6/20 >> small bowel surgical changes, mild circumferential wall thickening of scattered small bowel loops, small to moderate ascites LUE Duplex 6/24 >> DVT L IJ, subclavian, axillary, SVT L cephalic  CT ABD/Pelvis 5/64 >> large volume ascites with intraperitoneal free air   Micro Data:  COVID 6/07 >> negative Tracheal aspirate 6/20 >> E Coli sensitive to zosyn BCx2 6/23 >> negative BCx2  6/26 >> negative   Antimicrobials:  Zosyn 6/07 >> 6/17 Diflucan 6/07 >> 6/10 Cefepime 6/21 >>6/26 Vanco 6/21 >>  6/25 Anidulafungin 6/26 >> 7/06 Zosyn 6/26 >>7/10  Interim history/subjective:  No events, patient still appears to be suffering.  Objective   Blood pressure 131/62, pulse (!) 125, temperature 97.9 F (36.6 C), temperature source Axillary, resp. rate 18, height 5\' 4"  (1.626 m), weight 65 kg, SpO2 100 %.    Vent Mode: PRVC FiO2 (%):  [30 %] 30 %  Set Rate:  [18 bmp] 18 bmp Vt Set:  [430 mL] 430 mL PEEP:  [5 cmH20] 5 cmH20 Plateau Pressure:  [20 cmH20-24 cmH20] 22 cmH20   Intake/Output Summary (Last 24 hours) at 12/26/2018 0847 Last data filed at 12/26/2018 0800 Gross per 24 hour  Intake 5666.77 ml  Output 4717 ml  Net 949.77 ml   Filed Weights   12/23/18 0452 12/24/18 0500 12/25/18 0500  Weight: 65.2 kg 64.3 kg 65 kg   Physical Exam: GEN: ill appearing woman in NAD HEENT: ETT in place minimal secretions CV: tachycardic, ext warm PULM: diminished bases, no accessory muscle use GI: soft, drain in place with stool coming out EXT: no edema NEURO: grimaces but does with withdraw from pain PSYCH: RASS -5 SKIN: No rashes   Resolved Hospital Problem list   Metabolic alkalosis, Abdominal peritonitis with septic shock, Circulatory shock, Cardiorespiratory arrest 6/13, Shock liver, Hypophosphatemia, thrombocytopenia  Assessment & Plan:  63 year old critically ill female who remains in the ICU for multiorgan failure related to small bowel perforation s/p multiple attempts at repair, washout, and closure.  She unfortunately has developed recurrent leak without further surgical options.  In addition to this terminal diagnosis, she has a number of other hospital-acquired conditions detailed in other notes.  She is approaching end of life and we are prolonging her suffering.   - Appreciate palliative input, from my standpoint, we will not be offering escalation of care due to futility ie no blood transfusions, pressors or additional medications other than directed toward patient comfort  - Vent, CRRT, drains in interim  - See orders for remainder of care   Best practice:  Diet: TPN. DVT prophylaxis: SCDs GI prophylaxis: Protonix Mobility: Bedrest Code Status: Full code Disposition: ICU.    The patient is critically ill with multiple organ systems failure and requires high complexity decision making for assessment and support,  frequent evaluation and titration of therapies, application of advanced monitoring technologies and extensive interpretation of multiple databases. Critical Care Time devoted to patient care services described in this note independent of APP/resident time (if applicable)  is 32 minutes.   Erskine Emery MD [facility] 12/26/18 8:47 AM Personal pager: [phone] If unanswered, please page CCM On-call: [phone]

## 2018-12-26 NOTE — Progress Notes (Addendum)
PHARMACY - ADULT TOTAL PARENTERAL NUTRITION CONSULT NOTE   Pharmacy Consult for TPN Indication: prolonged ileus  HPI: 87 yoF admitted 6/7 with perforated small bowel from closed-loop obstruction and underwent emergent exploratory laparotomy. Returned to OR on 6/9 and again on 6/14 for closure of abdomen.  Patient coded on 12-07-2022 and developed multiorgan failure. Remains intubated & on CRRT. Pressors have been weaned off and she is completing antibiotic course for sepsis/PNA.  NPO since admission on 6/7. Pharmacy consulted to start TPN 6/16. D/t acute liver injury from shock, initiated TPN at low rate & advance slowly as requested by surgery.  Patient Measurements: Height: '5\' 4"'  (162.6 cm) Weight: 143 lb 4.8 oz (65 kg) IBW/kg (Calculated) : 54.7 TPN AdjBW (KG): 59.7 Body mass index is 24.6 kg/m. Usual Weight: 90kg  Significant events:  6/18: OK to increase to goal rate per surgery 6/22: CRRT stopped for holiday; CBG 69 overnight, D50 given but TPN continued (55 units insulin in TPN); BS and BM overnight - initiating trickle feeds 6/23: resuming CRRT, return to high-protein TPN; fevers overnight, CCM removing some central lines; will use chemo port for TPN 6/24: began adding lipids to TPN formula 6/25: trickle feeds stopped d/t abd distention 6/26: CRRT clotted off overnight; patient to OR this AM for ex lap; RN attempting to restart CRRT post-op but still having issues at this time 6/27: CRRT appears to have been successfully resumed overnight 6/29 OR pus throughout abdomen. Small hole in small bowel 7/3 OR wash out of abd, replace wound VAC, abd remains open 7/6 sched for OR today for wash out abd, replace wound VAC 7/8 new leak at surgical site; patient is not a candidate for additional surgeries. To continue TPN pending [place] discussion  Central access: 12/11/2018 TPN start date: 11/30/18  ASSESSMENT                                                                                                           Current Nutrition: NPO IVF: On CRRT to keep slightly positive  Today:   Glucose (goal 100-150) - Hx DM on 70/30 Novolog 20 units BID PTA.  CBGs improved with increased insulin in TPN (range 117-147)  55 units insulin in TPN + ICU resistant scale; 18 units SSI required yesterday  Electrolytes - on CRRT; all WNL except remains Cl deficient and corrected Ca elevated but improving  Renal - on CRRT; SCr, BUN following; bicarb elevated but improving (suspect Cl depletion alkalosis)  LFTs - albumin remains low; alk phos still mildly elevated and trending up, others WNL (7/9)  TGs - WNL (7/6)  Prealbumin - remains low but improving (7/6)  NUTRITIONAL GOALS  RD recs: (6/29):  Kcal:1493 kcal, Protein:195-215 gram Fluid:>/= 1.8 L/day  PLAN                                                                                                                          At 1800 today:   Continue TPN at 75 ml/hr   Custom TPN with lipids at goal rate of 75 ml/hr provides: 198 g/day protein, 1494 Kcal/day = 100% support  Electrolytes: max K, Mg, Phos; remove Ca (will let this be controlled by Citrate & Ca infusion titration); Cl:Ac = max Cl  TPN to contain standard MVI daily, trace elements MWF only due to national backorder.  IVF per MD - CRRT fluids  Continue ICU resistant-scale glycemic control orders q6h; continue 55 units regular insulin in TPN  Patient has had episodes of hypoglycemia previously; low threshold to pause TPN if becoming hypoglycemic  TPN lab panels Mondays & Thursdays  Ordered BID renal function panels and frequent iStat labs while on CRRT  Reuel Boom, PharmD, BCPS [phone] 12/26/2018, 7:17 AM

## 2018-12-26 NOTE — Progress Notes (Signed)
[facility] Office:  [phone] General Surgery Progress Note   LOS: 35 days  POD -  6 Days Post-Op  Chief Complaint: Abdominal pain  Assessment and Plan: 1.  EXPLORATORY LAPAROTOMY WITH WOUND WASHOUT AND SMALL BOWEL ANASTOMOSIS - 12/24/2018 - [place]  Has small bowel anastomosis leak  There are no further surgical options  Daughter and brother, Iona Beard, met with Dr. Earnest Conroy. Rowe Pavy, Palliative Care yesterday  1A.  Expl lap, wash out of abdomen, wound VAC - 12/27/2018 - Blackman 1B.  Multiple recent operations - 6/7 Ninfa Linden, 6/9 Rosendo Gros, 6/11 and 6/14 - [place], 6/26 Marlou Starks, 6/29 - [place] 1C.  Initial Operation 11/17/2018 - Exp lap, small bowel resection, lysis of adhesions, wound VAC - Blackman  2.  Respiratory failure - on vent 3.  Acute renal failure - followed by Dr. Mickel Crow  On CRRT 4.  DM 5.  Severe malnutrition - Pre albumn - 9.3 on 12/25/2018  On TPN 6.  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 Active Problems:   Perforated viscus   Encounter for central line placement   Septic shock ([place])   AKI (acute kidney injury) ([place])   S/P dialysis catheter insertion ([place])   Pressure injury of skin   Small bowel perforation ([place])   Acute respiratory failure with hypoxia and hypercapnia ([place])   Toxic metabolic encephalopathy   Thrombocytopenia, acquired ([place])   Dyspnea   Palliative care by specialist   Goals of care, counseling/discussion   Subjective:  Intubated, sedated  Objective:   Vitals:   12/26/18 0800 12/26/18 0830  BP: 131/62   Pulse: (!) 125   Resp: 18   Temp:    SpO2: 100% 100%     Intake/Output from previous day:  07/11 0701 - 07/12 0700 In: 5671.8 [I.V.:5370.8; IV Piggyback:301] Out: 1219 [Urine:824; Emesis/NG output:180; Stool:120]  Intake/Output this shift:  Total I/O In: 459.8 [I.V.:459.8] Out: 335 [Urine:55; Emesis/NG output:25; XJOIT:254;  Stool:40]   Physical Exam:   General: Older AA F who is intubated   HEENT: Normal. Pupils equal.   Abdomen: Eakins pouch with drainage.     Lab Results:    Recent Labs    12/25/18 0258  12/26/18 0309 12/26/18 0547  WBC 20.1*  --   --  24.7*  HGB 7.3*   < > 6.8* 6.8*  HCT 24.4*   < > 20.0* 22.7*  PLT 271  --   --  287   < > = values in this interval not displayed.    BMET   Recent Labs    12/25/18 1700  12/26/18 0309 12/26/18 0547  NA 144   < > 141 143  K 3.7   < > 3.6 4.2  CL 85*   < > 87* 83*  CO2 46*  --   --  44*  GLUCOSE 154*   < > 139* 151*  BUN 30*   < > 24* 31*  CREATININE 0.71   < > 0.70 0.73  CALCIUM 9.1  --   --  9.2   < > = values in this interval not displayed.    PT/INR  No results for input(s): LABPROT, INR in the last 72 hours.  ABG  No results for input(s): PHART, HCO3 in the last 72 hours.  Invalid input(s): PCO2, PO2   Studies/Results:  No results found.   Anti-infectives:   Anti-infectives (From admission,  onward)   Start     Dose/Rate Route Frequency Ordered Stop   12/03/2018 1030  clindamycin (CLEOCIN) 900 mg, gentamicin (GARAMYCIN) 240 mg in sodium chloride 0.9 % 1,000 mL for intraperitoneal lavage      Irrigation To Surgery 12/14/2018 1026 12/02/2018 1042   12/11/18 1500  anidulafungin (ERAXIS) 100 mg in sodium chloride 0.9 % 100 mL IVPB  Status:  Discontinued     100 mg 78 mL/hr over 100 Minutes Intravenous Every 24 hours 11/24/2018 1007 12/25/2018 1103   12/11/18 0600  piperacillin-tazobactam (ZOSYN) IVPB 3.375 g  Status:  Discontinued     3.375 g 100 mL/hr over 30 Minutes Intravenous Every 6 hours 12/11/18 0317 12/24/18 0737   12/08/2018 1800  piperacillin-tazobactam (ZOSYN) IVPB 2.25 g  Status:  Discontinued     2.25 g 100 mL/hr over 30 Minutes Intravenous Every 6 hours 12/09/2018 1413 12/11/18 0317   12/02/2018 1100  anidulafungin (ERAXIS) 200 mg in sodium chloride 0.9 % 200 mL IVPB     200 mg 78 mL/hr over 200 Minutes Intravenous  Once  12/02/2018 1007 12/02/2018 1456   12/02/2018 1100  piperacillin-tazobactam (ZOSYN) IVPB 3.375 g     3.375 g 100 mL/hr over 30 Minutes Intravenous  Once 12/07/2018 1027 12/05/2018 1245   12/08/18 1800  vancomycin (VANCOCIN) IVPB 750 mg/150 ml premix  Status:  Discontinued     750 mg 150 mL/hr over 60 Minutes Intravenous Every 48 hours 12/07/18 0822 12/07/18 0903   12/08/18 1000  ceFEPIme (MAXIPIME) 2 g in sodium chloride 0.9 % 100 mL IVPB  Status:  Discontinued     2 g 200 mL/hr over 30 Minutes Intravenous Every 24 hours 12/07/18 0822 12/07/18 0903   12/07/18 2200  ceFEPIme (MAXIPIME) 2 g in sodium chloride 0.9 % 100 mL IVPB  Status:  Discontinued     2 g 200 mL/hr over 30 Minutes Intravenous Every 12 hours 12/07/18 0903 11/16/2018 1027   12/07/18 1800  vancomycin (VANCOCIN) IVPB 750 mg/150 ml premix  Status:  Discontinued     750 mg 150 mL/hr over 60 Minutes Intravenous Every 24 hours 12/07/18 0903 12/09/18 1040   12/06/18 1800  vancomycin (VANCOCIN) IVPB 750 mg/150 ml premix  Status:  Discontinued     750 mg 150 mL/hr over 60 Minutes Intravenous Every 24 hours 12/05/18 1652 12/07/18 0822   12/05/18 1800  ceFEPIme (MAXIPIME) 2 g in sodium chloride 0.9 % 100 mL IVPB  Status:  Discontinued     2 g 200 mL/hr over 30 Minutes Intravenous Every 12 hours 12/05/18 1644 12/07/18 0822   12/05/18 1700  vancomycin (VANCOCIN) 1,250 mg in sodium chloride 0.9 % 250 mL IVPB     1,250 mg 166.7 mL/hr over 90 Minutes Intravenous  Once 12/05/18 1652 12/05/18 2019   12/04/2018 1800  fluconazole (DIFLUCAN) IVPB 200 mg  Status:  Discontinued     200 mg 100 mL/hr over 60 Minutes Intravenous Every 24 hours 11/22/2018 0748 11/22/2018 0847   12/05/2018 1800  fluconazole (DIFLUCAN) IVPB 400 mg  Status:  Discontinued     400 mg 100 mL/hr over 120 Minutes Intravenous Every 24 hours 11/18/2018 0847 11/29/2018 0856   12/06/2018 1400  piperacillin-tazobactam (ZOSYN) IVPB 2.25 g  Status:  Discontinued     2.25 g 100 mL/hr over 30 Minutes  Intravenous Every 6 hours 11/18/2018 0748 12/01/18 1104   12/07/2018 0600  cefoTEtan (CEFOTAN) 2 g in sodium chloride 0.9 % 100 mL IVPB     2  g 200 mL/hr over 30 Minutes Intravenous On call to O.R. 11/22/18 1300 12/04/2018 0624   11/22/18 1800  fluconazole (DIFLUCAN) IVPB 400 mg  Status:  Discontinued     400 mg 100 mL/hr over 120 Minutes Intravenous Every 24 hours 11/19/2018 1528 11/17/2018 0748   11/22/18 1800  vancomycin (VANCOCIN) 1,500 mg in sodium chloride 0.9 % 500 mL IVPB  Status:  Discontinued     1,500 mg 250 mL/hr over 120 Minutes Intravenous Every 24 hours 11/22/18 0358 11/22/18 0750   11/22/18 0115  vancomycin (VANCOCIN) 1,500 mg in sodium chloride 0.9 % 500 mL IVPB     1,500 mg 250 mL/hr over 120 Minutes Intravenous  Once 11/22/18 0101 11/22/18 0319   12/02/2018 1630  fluconazole (DIFLUCAN) IVPB 800 mg     800 mg 100 mL/hr over 240 Minutes Intravenous  Once 11/26/2018 1527 12/07/2018 2013   12/06/2018 1600  piperacillin-tazobactam (ZOSYN) IVPB 3.375 g  Status:  Discontinued     3.375 g 12.5 mL/hr over 240 Minutes Intravenous Every 8 hours 12/01/2018 1522 11/29/2018 0748   11/26/2018 0545  piperacillin-tazobactam (ZOSYN) IVPB 3.375 g     3.375 g 100 mL/hr over 30 Minutes Intravenous  Once 11/17/2018 0530 11/29/2018 0626   11/26/2018 0530  piperacillin-tazobactam (ZOSYN) IVPB 4.5 g  Status:  Discontinued     4.5 g 200 mL/hr over 30 Minutes Intravenous  Once 12/06/2018 0517 12/14/2018 0529      Alphonsa Overall, MD, FACS Pager: [facility] Office: [phone] 12/26/2018

## 2018-12-26 NOTE — Progress Notes (Signed)
[facility] ROUNDING NOTE   I/O +900 cc yest, UOP improving gradually. Remains off pressors   Objective:  Vital signs in last 24 hours:  Temp:  [97.5 F (36.4 C)-98 F (36.7 C)] 97.9 F (36.6 C) (07/12 0400) Pulse Rate:  [104-135] 125 (07/12 0800) Resp:  [13-31] 18 (07/12 0800) BP: (86-144)/(45-68) 131/62 (07/12 0800) SpO2:  [99 %-100 %] 100 % (07/12 0830) FiO2 (%):  [30 %] 30 % (07/12 0830)  Weight change:  Filed Weights   12/23/18 0452 12/24/18 0500 12/25/18 0500  Weight: 65.2 kg 64.3 kg 65 kg    Intake/Output: I/O last 3 completed shifts: In: 8492.8 [I.V.:8191.8; IV SWFUXNATF:573] Out: 2202 [RKYHC:6237; Emesis/NG output:270; SEGBT:5176; Stool:885]   Intake/Output this shift:  Total I/O In: 229.9 [I.V.:229.9] Out: 147 [Urine:20; Other:87; Stool:40]  Exam: Intubated and sedated CVS- RRR no JVP RS- CTA ant and lat ABD- BS present soft non-distended EXT-1-2+ lower extremity edema   Assessment/ Plan:   Acute kidney injury/ ATN: with very little signs of recovery.  She has required CRRT since 11/18/2018.  Baseline serum creatinine 0.9.  Acute kidney injury following perforated small bowel with fecal peritonitis and septic shock. Patient now has no surgical options and will be unable to survive this illness. Dialysis currently is prolonging her suffering. Support transition to comfort care and withdrawal of dialysis/ CRRT.    Perforated small bowel - multiple surgeries w/ significant SB resection  Hypotension/volume: no vol excess on exam. 10kg under admit wt, euvolemic on exam. Getting TNA w/ volume load.   Anemia - transfuse prn  CM EF 20%  Diabetes mellitus per primary team  Nutrition continues on TNA  Hypophosphatemia: resolved     Kelly Splinter, MD 12/26/2018, 8:47 AM     Basic Metabolic Panel: Recent Labs  Lab 12/22/18 0415  12/23/18 0435  12/24/18 0300  12/24/18 1600  12/25/18 0258  12/25/18 1700 12/25/18 1834 12/25/18 1841  12/26/18 0211 12/26/18 0309 12/26/18 0547  NA 138   < > 138   < > 140   < > 143   < > 145   < > 144 140 140 141 141 143  K 4.4   < > 3.4*   < > 3.7   < > 3.3*   < > 3.5   < > 3.7 3.5 3.5 3.8 3.6 4.2  CL 88*   < > 79*   < > 77*   < > 75*   < > 81*   < > 85* 83* 80* 81* 87* 83*  CO2 35*   < > 41*   < > 44*  --  48*  --  49*  --  46*  --   --   --   --  44*  GLUCOSE 186*   < > 187*   < > 166*   < > 176*   < > 124*   < > 154* 155* 258* 237* 139* 151*  BUN 29*   < > 33*   < > 32*   < > 32*   < > 31*   < > 30* 25* 22 23 24* 31*  CREATININE 0.74   < > 0.87   < > 0.89   < > 0.89   < > 0.79   < > 0.71 0.70 0.60 0.60 0.70 0.73  CALCIUM 10.4*   < > 12.3*   < > 10.0  --  10.6*  --  10.0  --  9.1  --   --   --   --  9.2  MG 2.0  --  1.8  --  2.0  --   --   --  1.8  --   --   --   --   --   --  2.0  PHOS 2.4*   < > 2.5   < > 2.7  --  2.7  --  2.2*  --  3.1  --   --   --   --  2.6   < > = values in this interval not displayed.    Liver Function Tests: Recent Labs  Lab 12/15/2018 0345  12/23/18 0435  12/24/18 0300 12/24/18 1600 12/25/18 0258 12/25/18 1700 12/26/18 0547  AST 25  --  20  --   --   --   --   --   --   ALT 19  --  16  --   --   --   --   --   --   ALKPHOS 139*  --  178*  --   --   --   --   --   --   BILITOT 0.4  --  0.5  --   --   --   --   --   --   PROT 4.9*  --  5.6*  --   --   --   --   --   --   ALBUMIN 1.3*  1.4*   < > 1.2*  1.2*   < > 1.2* 1.2* 1.2* 1.2* 1.2*   < > = values in this interval not displayed.   No results for input(s): LIPASE, AMYLASE in the last 168 hours. No results for input(s): AMMONIA in the last 168 hours.  CBC: Recent Labs  Lab 12/31/2018 0345  12/22/18 0415  12/23/18 0435  12/24/18 0300  12/25/18 0258  12/25/18 1834 12/25/18 1841 12/26/18 0211 12/26/18 0309 12/26/18 0547  WBC 21.9*   < > 20.0*  --  19.6*  --  16.6*  --  20.1*  --   --   --   --   --  24.7*  NEUTROABS 16.9*  --   --   --   --   --   --   --   --   --   --   --   --   --   --    HGB 9.2*   < > 8.1*   < > 7.5*   < > 7.5*   < > 7.3*   < > 6.5* 7.5* 8.8* 6.8* 6.8*  HCT 28.2*   < > 25.5*   < > 24.0*   < > 23.8*   < > 24.4*   < > 19.0* 22.0* 26.0* 20.0* 22.7*  MCV 90.7   < > 97.0  --  97.2  --  98.8  --  101.2*  --   --   --   --   --  101.8*  PLT 206   < > 232  --  252  --  253  --  271  --   --   --   --   --  287   < > = values in this interval not displayed.     Medications:   .  prismasol BGK 4/2.5 200 mL/hr at 12/25/18 2145  . sodium chloride Stopped (12/25/18 0957)  . calcium gluconate infusion for CRRT 140 mL/hr at 12/26/18 0800  . fentaNYL infusion INTRAVENOUS 150 mcg/hr (12/26/18  0800)  . prismasol [id number] 4/0 1,500 mL/hr at 12/26/18 0534  . sodium citrate 2 %/dextrose 2.5% solution 3000 mL 710 mL/hr at 12/26/18 0635  . TPN ADULT (ION) 75 mL/hr at 12/26/18 0800  . TPN ADULT (ION)     . chlorhexidine gluconate (MEDLINE KIT)  15 mL Mouth Rinse BID  . Chlorhexidine Gluconate Cloth  6 each Topical Daily  . dorzolamide-timolol  1 drop Both Eyes BID  . insulin aspart  3-9 Units Subcutaneous Q6H  . levothyroxine  12.5 mcg Intravenous Daily  . mouth rinse  15 mL Mouth Rinse 10 times per day  . pantoprazole (PROTONIX) IV  40 mg Intravenous Q24H   sodium chloride, albuterol, alteplase, fentaNYL, heparin, hydrALAZINE, metoprolol tartrate, sodium chloride, sodium chloride flush

## 2018-12-27 LAB — POCT I-STAT, CHEM 8
BUN: 21 mg/dL (ref 8–23)
BUN: 25 mg/dL — ABNORMAL HIGH (ref 8–23)
Calcium, Ion: 0.93 mmol/L — ABNORMAL LOW (ref 1.15–1.40)
Calcium, Ion: <0.3 mmol/L — CL (ref 1.15–1.40)
Chloride: 81 mmol/L — ABNORMAL LOW (ref 98–111)
Chloride: 82 mmol/L — ABNORMAL LOW (ref 98–111)
Creatinine, Ser: 0.6 mg/dL (ref 0.44–1.00)
Creatinine, Ser: 0.7 mg/dL (ref 0.44–1.00)
Glucose, Bld: 186 mg/dL — ABNORMAL HIGH (ref 70–99)
Glucose, Bld: 266 mg/dL — ABNORMAL HIGH (ref 70–99)
HCT: 20 % — ABNORMAL LOW (ref 36.0–46.0)
HCT: 22 % — ABNORMAL LOW (ref 36.0–46.0)
Hemoglobin: 6.8 g/dL — CL (ref 12.0–15.0)
Hemoglobin: 7.5 g/dL — ABNORMAL LOW (ref 12.0–15.0)
Potassium: 3.5 mmol/L (ref 3.5–5.1)
Potassium: 3.9 mmol/L (ref 3.5–5.1)
Sodium: 141 mmol/L (ref 135–145)
Sodium: 141 mmol/L (ref 135–145)
TCO2: 42 mmol/L — ABNORMAL HIGH (ref 22–32)
TCO2: 46 mmol/L — ABNORMAL HIGH (ref 22–32)

## 2018-12-27 LAB — DIFFERENTIAL
Band Neutrophils: 8 %
Basophils Absolute: 0.3 10*3/uL — ABNORMAL HIGH (ref 0.0–0.1)
Basophils Relative: 1 %
Blasts: 0 %
Eosinophils Absolute: 0.5 10*3/uL (ref 0.0–0.5)
Eosinophils Relative: 2 %
Lymphocytes Relative: 2 %
Lymphs Abs: 0.5 10*3/uL — ABNORMAL LOW (ref 0.7–4.0)
Metamyelocytes Relative: 4 %
Monocytes Absolute: 0.3 10*3/uL (ref 0.1–1.0)
Monocytes Relative: 1 %
Myelocytes: 1 %
Neutro Abs: 25.2 10*3/uL — ABNORMAL HIGH (ref 1.7–7.7)
Neutrophils Relative %: 81 %
Other: 0 %
Promyelocytes Relative: 0 %
nRBC: 0 /100 WBC

## 2018-12-27 LAB — CBC
HCT: 22.8 % — ABNORMAL LOW (ref 36.0–46.0)
Hemoglobin: 6.9 g/dL — CL (ref 12.0–15.0)
MCH: 30.9 pg (ref 26.0–34.0)
MCHC: 30.3 g/dL (ref 30.0–36.0)
MCV: 102.2 fL — ABNORMAL HIGH (ref 80.0–100.0)
Platelets: 298 10*3/uL (ref 150–400)
RBC: 2.23 MIL/uL — ABNORMAL LOW (ref 3.87–5.11)
RDW: 15.5 % (ref 11.5–15.5)
WBC: 26.8 10*3/uL — ABNORMAL HIGH (ref 4.0–10.5)
nRBC: 0.3 % — ABNORMAL HIGH (ref 0.0–0.2)

## 2018-12-27 LAB — CALCIUM, IONIZED: Calcium, Ionized, Serum: 4.7 mg/dL (ref 4.5–5.6)

## 2018-12-27 LAB — HEPATIC FUNCTION PANEL
ALT: 20 U/L (ref 0–44)
AST: 28 U/L (ref 15–41)
Albumin: 1.2 g/dL — ABNORMAL LOW (ref 3.5–5.0)
Alkaline Phosphatase: 268 U/L — ABNORMAL HIGH (ref 38–126)
Bilirubin, Direct: 0.2 mg/dL (ref 0.0–0.2)
Indirect Bilirubin: 0.2 mg/dL — ABNORMAL LOW (ref 0.3–0.9)
Total Bilirubin: 0.4 mg/dL (ref 0.3–1.2)
Total Protein: 6.3 g/dL — ABNORMAL LOW (ref 6.5–8.1)

## 2018-12-27 LAB — RENAL FUNCTION PANEL
Albumin: 1.2 g/dL — ABNORMAL LOW (ref 3.5–5.0)
Anion gap: 15 (ref 5–15)
BUN: 30 mg/dL — ABNORMAL HIGH (ref 8–23)
CO2: 46 mmol/L — ABNORMAL HIGH (ref 22–32)
Calcium: 9.2 mg/dL (ref 8.9–10.3)
Chloride: 84 mmol/L — ABNORMAL LOW (ref 98–111)
Creatinine, Ser: 0.79 mg/dL (ref 0.44–1.00)
GFR calc Af Amer: >60 mL/min (ref >60)
GFR calc non Af Amer: >60 mL/min (ref >60)
Glucose, Bld: 195 mg/dL — ABNORMAL HIGH (ref 70–99)
Phosphorus: 2.5 mg/dL (ref 2.5–4.6)
Potassium: 4 mmol/L (ref 3.5–5.1)
Sodium: 145 mmol/L (ref 135–145)

## 2018-12-27 LAB — GLUCOSE, CAPILLARY
Glucose-Capillary: 176 mg/dL — ABNORMAL HIGH (ref 70–99)
Glucose-Capillary: 142 mg/dL — ABNORMAL HIGH (ref 70–99)
Glucose-Capillary: 40 mg/dL — CL (ref 70–99)
Glucose-Capillary: 120 mg/dL — ABNORMAL HIGH (ref 70–99)

## 2018-12-27 LAB — TRIGLYCERIDES: Triglycerides: 166 mg/dL — ABNORMAL HIGH (ref <150)

## 2018-12-27 LAB — MAGNESIUM: Magnesium: 2 mg/dL (ref 1.7–2.4)

## 2018-12-27 LAB — PREALBUMIN: Prealbumin: 7.8 mg/dL — ABNORMAL LOW (ref 18–38)

## 2018-12-27 MED ORDER — MIDAZOLAM HCL 2 MG/2ML IJ SOLN
1.0000 mg | INTRAMUSCULAR | Status: DC | PRN
  Administered 2019-01-07 (×4): 2 mg via INTRAVENOUS
  Filled 2018-12-27 (×5): qty 2

## 2018-12-27 MED ORDER — TRACE MINERALS CR-CU-MN-SE-ZN 10-1000-500-60 MCG/ML IV SOLN
INTRAVENOUS | Status: DC
  Filled 2018-12-27: qty 1320

## 2018-12-27 MED ORDER — TRACE MINERALS CR-CU-MN-SE-ZN 10-1000-500-60 MCG/ML IV SOLN
INTRAVENOUS | Status: AC
  Administered 2018-12-27: 19:00:00 via INTRAVENOUS
  Filled 2018-12-27: qty 352

## 2018-12-27 MED ORDER — ALBUMIN HUMAN 25 % IV SOLN
25.0000 g | Freq: Once | INTRAVENOUS | Status: AC
  Administered 2018-12-27: 25 g via INTRAVENOUS
  Filled 2018-12-27: qty 50

## 2018-12-27 MED ORDER — ACETAMINOPHEN 650 MG RE SUPP
650.0000 mg | RECTAL | Status: DC | PRN
  Administered 2018-12-27 – 2019-01-03 (×8): 650 mg via RECTAL
  Filled 2018-12-27 (×8): qty 1

## 2018-12-27 MED ORDER — SODIUM CHLORIDE 0.9 % IV BOLUS
250.0000 mL | Freq: Once | INTRAVENOUS | Status: AC
  Administered 2018-12-27: 250 mL via INTRAVENOUS

## 2018-12-27 MED ORDER — FENTANYL BOLUS VIA INFUSION
25.0000 ug | INTRAVENOUS | Status: DC | PRN
  Administered 2018-12-27 – 2018-12-29 (×3): 25 ug via INTRAVENOUS
  Filled 2018-12-27: qty 25

## 2018-12-27 MED ORDER — DEXTROSE 50 % IV SOLN
INTRAVENOUS | Status: AC
  Administered 2018-12-27: 50 mL
  Filled 2018-12-27: qty 50

## 2018-12-27 MED ORDER — STERILE WATER FOR INJECTION IV SOLN
INTRAVENOUS | Status: AC
  Filled 2018-12-27: qty 352

## 2018-12-27 NOTE — Consult Note (Signed)
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 Measurement: 20cm x 4cm x did not measure depth, stool is pouring from distal portion of the wound bed  Wound bed:50% yellow stringy slough material/50% red Drainage (amount, consistency, odor) succus draining from the distal portion of the wound bed in large amounts; succus draining from a small area of the most proximal portion of the wound, but not as much Periwound:denudation from drainage noted under the pannus with skin breakdown, making it very challenging to have Eakin pouch seal to the skin  Dressing procedure/placement/frequency: Used strips of Eakin to line wound edges and fill pannus/skn fold, bedside nurse is holding suction to the distal fistula to help to keep the skin dry. Placed new large Eakin with RR catheter in place to LWS at 50mmHg.  Used barrier extenders along the distal aspect of the Eakin pouch. Wound extends the whole length of the Eakin making it challenging to have much skin barrier to attach to the patient.   Bedside nurse and new graduate nurse at the bedside to assist.  Unfortunate situation with no surgical options and large amounts of stool leaking from the belly at this time. Not sure if management with Shirlean Schlein will be successful even with suction due to the skin damage under the pannus and the distal aspect of the wound.   [place] nurse team will follow along with you [place], [place], [place]

## 2018-12-27 NOTE — Progress Notes (Addendum)
[facility] Progress Note  7 Days Post-Op  Subjective: CC-  Remains on the vent, CRRT.  Code status changed to DNR. Per last palliative note one way extubation/comfort care was discussed and patient's brother and daughter wanted to discuss further with extended family.  Objective: Vital signs in last 24 hours: Temp:  [97.8 F (36.6 C)-99.2 F (37.3 C)] 97.8 F (36.6 C) (07/13 0400) Pulse Rate:  [101-132] 113 (07/13 0736) Resp:  [18-24] 19 (07/13 0700) BP: (96-148)/(48-87) 112/59 (07/13 0700) SpO2:  [96 %-100 %] 100 % (07/13 0700) FiO2 (%):  [30 %] 30 % (07/13 0736) Weight:  [65.1 kg] 65.1 kg (07/13 0500) Last BM Date: 12/26/18  Intake/Output from previous day: 07/12 0701 - 07/13 0700 In: 5536.5 [I.V.:5536.5] Out: 3884 [Urine:990; Emesis/NG output:175; Stool:205] Intake/Output this shift: Total I/O In: 90 [I.V.:90] Out: 163 [Urine:45; Other:118]  PE: Gen:  On the vent Pulm:  Few rhonchi bilaterally, mechanically ventilated Abd: Soft, Eakins pouch in place with feculent drainage Ext:  1+ edema BLE/BUE Skin: warm and dry  Lab Results:  Recent Labs    12/26/18 0547  12/27/18 0351 12/27/18 0354 12/27/18 0401  WBC 24.7*  --  26.8*  --   --   HGB 6.8*   < > 6.9* 7.5* 6.8*  HCT 22.7*   < > 22.8* 22.0* 20.0*  PLT 287  --  298  --   --    < > = values in this interval not displayed.   BMET Recent Labs    12/26/18 1510  12/27/18 0350 12/27/18 0354 12/27/18 0401  NA 143   < > 145 141 141  K 4.2   < > 4.0 3.5 3.9  CL 82*   < > 84* 81* 82*  CO2 43*  --  46*  --   --   GLUCOSE 151*   < > 195* 266* 186*  BUN 32*   < > 30* 21 25*  CREATININE 0.80   < > 0.79 0.60 0.70  CALCIUM 9.3  --  9.2  --   --    < > = values in this interval not displayed.   PT/INR No results for input(s): LABPROT, INR in the last 72 hours. CMP     Component Value Date/Time   NA 141 12/27/2018 0401   NA 138 06/01/2017 0859   K 3.9 12/27/2018 0401   K 4.2 06/01/2017 0859    CL 82 (L) 12/27/2018 0401   CO2 46 (H) 12/27/2018 0350   CO2 24 06/01/2017 0859   GLUCOSE 186 (H) 12/27/2018 0401   GLUCOSE 118 06/01/2017 0859   BUN 25 (H) 12/27/2018 0401   BUN 15.2 06/01/2017 0859   CREATININE 0.70 12/27/2018 0401   CREATININE 0.79 06/02/2018 0856   CREATININE 0.7 06/01/2017 0859   CALCIUM 9.2 12/27/2018 0350   CALCIUM 9.0 06/01/2017 0859   PROT 6.3 (L) 12/27/2018 0351   PROT 7.0 06/01/2017 0859   ALBUMIN 1.2 (L) 12/27/2018 0351   ALBUMIN 3.7 06/01/2017 0859   AST 28 12/27/2018 0351   AST 13 (L) 06/02/2018 0856   AST 14 06/01/2017 0859   ALT 20 12/27/2018 0351   ALT 16 06/02/2018 0856   ALT 12 06/01/2017 0859   ALKPHOS 268 (H) 12/27/2018 0351   ALKPHOS 97 06/01/2017 0859   BILITOT 0.4 12/27/2018 0351   BILITOT 0.3 06/02/2018 0856   BILITOT 0.22 06/01/2017 0859   GFRNONAA >60 12/27/2018 0350   GFRNONAA >60 06/02/2018 5462  GFRAA >60 12/27/2018 0350   GFRAA >60 06/02/2018 0856   Lipase     Component Value Date/Time   LIPASE 24 11/22/2018 0355       Studies/Results: No results found.  Anti-infectives: Anti-infectives (From admission, onward)   Start     Dose/Rate Route Frequency Ordered Stop   11/17/2018 1030  clindamycin (CLEOCIN) 900 mg, gentamicin (GARAMYCIN) 240 mg in sodium chloride 0.9 % 1,000 mL for intraperitoneal lavage      Irrigation To Surgery 11/24/2018 1026 11/15/2018 1042   12/11/18 1500  anidulafungin (ERAXIS) 100 mg in sodium chloride 0.9 % 100 mL IVPB  Status:  Discontinued     100 mg 78 mL/hr over 100 Minutes Intravenous Every 24 hours 11/26/2018 1007 12/22/2018 1103   12/11/18 0600  piperacillin-tazobactam (ZOSYN) IVPB 3.375 g  Status:  Discontinued     3.375 g 100 mL/hr over 30 Minutes Intravenous Every 6 hours 12/11/18 0317 12/24/18 0737   11/26/2018 1800  piperacillin-tazobactam (ZOSYN) IVPB 2.25 g  Status:  Discontinued     2.25 g 100 mL/hr over 30 Minutes Intravenous Every 6 hours 12/12/2018 1413 12/11/18 0317   11/22/2018 1100   anidulafungin (ERAXIS) 200 mg in sodium chloride 0.9 % 200 mL IVPB     200 mg 78 mL/hr over 200 Minutes Intravenous  Once 11/16/2018 1007 12/06/2018 1456   12/08/2018 1100  piperacillin-tazobactam (ZOSYN) IVPB 3.375 g     3.375 g 100 mL/hr over 30 Minutes Intravenous  Once 11/20/2018 1027 11/15/2018 1245   12/08/18 1800  vancomycin (VANCOCIN) IVPB 750 mg/150 ml premix  Status:  Discontinued     750 mg 150 mL/hr over 60 Minutes Intravenous Every 48 hours 12/07/18 0822 12/07/18 0903   12/08/18 1000  ceFEPIme (MAXIPIME) 2 g in sodium chloride 0.9 % 100 mL IVPB  Status:  Discontinued     2 g 200 mL/hr over 30 Minutes Intravenous Every 24 hours 12/07/18 0822 12/07/18 0903   12/07/18 2200  ceFEPIme (MAXIPIME) 2 g in sodium chloride 0.9 % 100 mL IVPB  Status:  Discontinued     2 g 200 mL/hr over 30 Minutes Intravenous Every 12 hours 12/07/18 0903 12/01/2018 1027   12/07/18 1800  vancomycin (VANCOCIN) IVPB 750 mg/150 ml premix  Status:  Discontinued     750 mg 150 mL/hr over 60 Minutes Intravenous Every 24 hours 12/07/18 0903 12/09/18 1040   12/06/18 1800  vancomycin (VANCOCIN) IVPB 750 mg/150 ml premix  Status:  Discontinued     750 mg 150 mL/hr over 60 Minutes Intravenous Every 24 hours 12/05/18 1652 12/07/18 0822   12/05/18 1800  ceFEPIme (MAXIPIME) 2 g in sodium chloride 0.9 % 100 mL IVPB  Status:  Discontinued     2 g 200 mL/hr over 30 Minutes Intravenous Every 12 hours 12/05/18 1644 12/07/18 0822   12/05/18 1700  vancomycin (VANCOCIN) 1,250 mg in sodium chloride 0.9 % 250 mL IVPB     1,250 mg 166.7 mL/hr over 90 Minutes Intravenous  Once 12/05/18 1652 12/05/18 2019   12/09/2018 1800  fluconazole (DIFLUCAN) IVPB 200 mg  Status:  Discontinued     200 mg 100 mL/hr over 60 Minutes Intravenous Every 24 hours 11/22/2018 0748 12/07/2018 0847   12/08/2018 1800  fluconazole (DIFLUCAN) IVPB 400 mg  Status:  Discontinued     400 mg 100 mL/hr over 120 Minutes Intravenous Every 24 hours 12/14/2018 0847 12/07/2018 0856    11/20/2018 1400  piperacillin-tazobactam (ZOSYN) IVPB 2.25 g  Status:  Discontinued  2.25 g 100 mL/hr over 30 Minutes Intravenous Every 6 hours 12/03/2018 0748 12/01/18 1104   12/14/2018 0600  cefoTEtan (CEFOTAN) 2 g in sodium chloride 0.9 % 100 mL IVPB     2 g 200 mL/hr over 30 Minutes Intravenous On call to O.R. 11/22/18 1300 12/08/2018 0624   11/22/18 1800  fluconazole (DIFLUCAN) IVPB 400 mg  Status:  Discontinued     400 mg 100 mL/hr over 120 Minutes Intravenous Every 24 hours 12/09/2018 1528 11/24/2018 0748   11/22/18 1800  vancomycin (VANCOCIN) 1,500 mg in sodium chloride 0.9 % 500 mL IVPB  Status:  Discontinued     1,500 mg 250 mL/hr over 120 Minutes Intravenous Every 24 hours 11/22/18 0358 11/22/18 0750   11/22/18 0115  vancomycin (VANCOCIN) 1,500 mg in sodium chloride 0.9 % 500 mL IVPB     1,500 mg 250 mL/hr over 120 Minutes Intravenous  Once 11/22/18 0101 11/22/18 0319   12/06/2018 1630  fluconazole (DIFLUCAN) IVPB 800 mg     800 mg 100 mL/hr over 240 Minutes Intravenous  Once 11/16/2018 1527 12/09/2018 2013   12/07/2018 1600  piperacillin-tazobactam (ZOSYN) IVPB 3.375 g  Status:  Discontinued     3.375 g 12.5 mL/hr over 240 Minutes Intravenous Every 8 hours 11/15/2018 1522 11/18/2018 0748   12/12/2018 0545  piperacillin-tazobactam (ZOSYN) IVPB 3.375 g     3.375 g 100 mL/hr over 30 Minutes Intravenous  Once 11/18/2018 0530 12/05/2018 0626   11/16/2018 0530  piperacillin-tazobactam (ZOSYN) IVPB 4.5 g  Status:  Discontinued     4.5 g 200 mL/hr over 30 Minutes Intravenous  Once 12/12/2018 0517 12/12/2018 0529       Assessment/Plan Perforated small bowel with closed-loop obstruction  1.Exploratory laparotomy small bowel resection, lysis of adhesions, 1.5 hours, placement of wound VAC 12/05/2018 Dr. Coralie Keens 2. Exploratory laparotomy, washout and placement of wound VAC 11/30/2018 Dr. Marcello Moores [place] 3. Exploratory laparotomy with anastomosis of small bowel and closure of abdominal wall 12/09/2018 Dr.  Marcello Moores [place] 4.Exploratory laparotomy, resection of previous anastomosis, wound VAC placement 12/01/2018 Dr. Autumn Messing 5. Reopening of recent laparotomy, small bowel resection, placement of wound VAC 12/06/2018, Dr. Dorris Fetch [place](FINDINGS:Pus throughout abdomen, though minimal succus. Bilious rind over intestines. Small hole on distal segment of small bowel with small amount of bile) 6. EXPLORATORY LAPAROTOMY, [place] OUT OF ABDOMEN APPLICATION OF WOUND VAC ([place]) 12/21/2018 Dr. Ninfa Linden 7. Exploratory laparotomy with abdominal washout, Small bowel anastomosis 01/03/2019 Dr. Dema Severin             Has small bowel anastomosis leak             There are no further surgical options  Respiratory failure - on vent Acute renal failure - followed by Dr. Mickel Crow             On CRRT DM Severe malnutrition - Pre albumn - 9.3 on 01/14/2019             On TPN Anemia             Hgb - 6.9 - 12/27/2018  FEN: N.p.o./TPN ID: none currently DVT: SCDs, heparin held due to anemia Follow-up: To be determined POC: Mattier,George Brother [phone]  [phone]  Matier,Vickie    [phone]   Plan: Unfortunately patient has had multiple operations and remains critically ill. No further surgical options.   Appreciate CCM and palliative care assistance. Agree with DNR. Depending on patient's family discussions would also agree with comfort care/one way extubation.  Please  page me when the patient's brother is here.   LOS: 36 days    Carmen Stone , [facility] Surgery 12/27/2018, 8:28 AM Pager: [phone]  Agree with above.  Will stop lab draws, since it does not change management.  I see the note about the "futility page".  I have not used it before.  Met with brother, Carmen Stone, and daughter, at bedside. Like everyone, I outlined the findings and that continued care will not change the eventual outcome. I think he has some lack of insight into her prognosis and he  does not explain why he wants continued care in the face of an unsurvivable process. The brother does not want any change in care  Carmen Stone Overall, MD, [facility] Surgery Pager: [phone] Office phone:  [phone]

## 2018-12-27 NOTE — Progress Notes (Signed)
Nutrition Follow-up  DOCUMENTATION CODES:   Not applicable  INTERVENTION:  - continue TPN per Pharmacy. - will continue to monitor for plans moving forward/family decision.    NUTRITION DIAGNOSIS:   Inadequate oral intake related to inability to eat as evidenced by NPO status. -ongoing  GOAL:   Patient will meet greater than or equal to 90% of their needs -met with TPN regimen  MONITOR:   Vent status, Labs, Weight trends, Skin, I & O's, Other (Comment)(TPN regimen)  ASSESSMENT:   63 year old female with past medical history of type 2 DM, hyperlipidemia, HTN, inflammatory R breast cancer s/p radiation in 2015. Patient presented to the ED on 6/7 with severe abdominal pain, N/V. CT abdomen/pelvis which showed perforated small bowel with large amount of free air and multiple incarcerated incisional hernias.   Significant Events: 6/7- admission; ex lap, small bowel resection, LOAs, and placement of wound vac; NGT placement 6/9- CRRT initiation; return to OR for re-opening of open abdomen with washout 6/12- CRRT stopped 6/14- bowel anastomosis and closure of abdominal wall; CRRT re-started 6/16- initiation of TPN 6/19- extubation 6/20- re-intubation for AMS 6/21- CRRT stopped for break 6/22- initiation of trickle TF 6/23- CRRT re-started 6/24- begin slowly advancing TF to goal rate 6/25- TF stopped and NGT to LIS d/t abdominal distention 6/26- CT abdomen/pelvis (+) for free air; return to OR for ex lap, resection of previous anastomosis, application of wound vac 6/29- return to OR (possible abd closure today) 6/30- Palliative Care consulted 7/3- return to OR for ex lap, wash out of abdomen, placement of wound vac (open abd) 7/6- return to OR for washout, possible bowel anastomosis, and possible ostomy 7/13- now making urine   Weight has been stable over the past 1 week. Re-did NFPE this AM. Patient remains intubated with NGT to LIS with scant amount of output in tubing, no  output in canister. NGT was placed on 6/7 and would recommend replacing, but patient grimacing during RD visit and RN reports that patient is uncomfortable; also in light of possible move to comfort care. She is receiving custom TPN at goal rate of 75 ml/hr which is providing 1494 kcal, 198 grams protein. She continues on CRRT.  Notes indicate that patient has developed recurrent leak and she is nearing EOL. She is not a candidate for trach and not a candidate for further surgical intervention. Pete's note this AM indicates renal status is improving and the recommendation for stopping CRRT with no plan for re-start as this would be futile. Anemia of critical illness and noted that she is no longer a candidate for transfusion.   Family continues discussions about possible one-way extubation and move to comfort care.    Patient is currently intubated on ventilator support MV: 8.5 L/min Temp (24hrs), Avg:98.1 F (36.7 C), Min:97.7 F (36.5 C), Max:99.2 F (37.3 C) Propofol: none  Medications reviewed; sliding scale novolog, 12.5 mcg IV synthroid/day, 40 mg IV protonix BID. Labs reviewed; CBG: 176 mg/dl, Cl: 82 mmol/l, BUN: 25 mg/dl, ionized Ca: 0.93 mmol/l, Alk Phos elevated, triglycerides today: 166 mg/dl. Drip; fentanyl @ 150 mcg/hr.     NUTRITION - FOCUSED PHYSICAL EXAM:    Most Recent Value  Orbital Region  No depletion  Upper Arm Region  No depletion  Thoracic and Lumbar Region  Unable to assess  Buccal Region  No depletion  Temple Region  No depletion  Clavicle Bone Region  Mild depletion  Clavicle and Acromion Bone Region  Mild depletion  Scapular Bone  Region  Unable to assess  Dorsal Hand  No depletion  Patellar Region  No depletion  Anterior Thigh Region  Unable to assess  Posterior Calf Region  Mild depletion  Edema (RD Assessment)  -- [severe/deep pitting to LUE]  Hair  Reviewed  Eyes  Unable to assess  Mouth  Unable to assess  Skin  Reviewed  Nails  Reviewed        Diet Order:   Diet Order    None      EDUCATION NEEDS:   No education needs have been identified at this time  Skin:  Skin Assessment: Skin Integrity Issues: Skin Integrity Issues:: DTI, Stage II, Incisions DTI: sacrum Stage II: L ear Wound Vac: abdomen Incisions: abdomen (7/6)  Last BM:  7/13 (10 ml via ostomy)  Height:   Ht Readings from Last 1 Encounters:  12/26/18 _0  (1.626 m)    Weight:   Wt Readings from Last 1 Encounters:  12/27/18 65.1 kg    Ideal Body Weight:  54.5 kg  BMI:  Body mass index is 24.64 kg/m.  Estimated Nutritional Needs:   Kcal:  1423 kcal  Protein:  195-215 grams  Fluid:  >/= 1.8 L/day     Jarome Matin, MS, RD, LDN, [facility] Inpatient Clinical Dietitian Pager # [phone] After hours/weekend pager # [phone]

## 2018-12-27 NOTE — Progress Notes (Signed)
NAME:  Carmen Stone, MRN:  [id number], DOB:  [birth year], LOS: 61 ADMISSION DATE:  11/30/2018, CONSULTATION DATE:  11/21/2018 REFERRING MD:  Dr. Ninfa Linden, Surgery, CHIEF COMPLAINT:  Abdominal pain   Brief History   63 y/o female presented to ER 11/24/2018 with nausea, vomiting, abdominal pain.  CT abd/pelvis showed focal perforated loop of SB with free air with multiple complex ventral hernias.  Underwent emergent laparotomy with SB resection, lysis of adhesions and wound vac.  Remained on vent and pressors post op.  Hospital course complicated by respiratory arrest 6/13, multiple reintubations, intermittent vasopressor needs, AKI on CVVHD.     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 Changing volume goal on CRRT to -100 cc an hour. Weaned 7 hours PSV   6/16 Encephalopathic. Hypertensive, adding labetalol.  IV TNA started 6/17 A little more awake.  Tolerating PSV but SBI exceeds 105.  Volume removal with CVVHD 6/18 Neuro status improved.  Required brief vasoactive drips.  Net negative fluid volume status.PSV 6/19 Extubated 6/20 reintubated for altered mentation, head CT negative  6/21 Tx PRBC x1.  Off vasopressors 6/23 Febrile overnight, Blood Cx. Transfuse 1 U PRBC 6/24 Afebrile. Remains off pressors. 100cc/hr removal /c CRRT. 30% FiO2.  6/25 L IJ, subclavian, axillary  + DVT. Heparin initiated. Remains on CRRT.  6/26  CT ABD/pelvis positive for free air and ascites. Back to OR  for ex lap of perforated viscous, resection of previous anastomosis, application of wound vac.  1U RBCs intra op.   6/28 Back on pressors with hgb <7 / sedated on fent drip, PRBC 6/29 Pending OR for possible closure  7/01 Less sedation, following commands, abd open with VAC, on CVVHD (even) 7/03 OR, unable to close but bowel "looks some better", hematoma in abd, on CVVHD 7/05 hemoglobin drop with red drainage from wound vac, heparin gtt held, transfuse PRBC 7/06 laparotomy, washout, SB anastomosis 7/13 no sig change. Making urine.  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    CT Chest 6/20 >> small bilateral pleural effusions with compressive atelectasis, anterior R 4-7 rib fractures CT ABD/Pelvis 6/20 >> small bowel surgical changes, mild circumferential wall thickening of scattered small bowel loops, small to moderate ascites LUE Duplex 6/24 >> DVT L IJ, subclavian, axillary, SVT L cephalic  CT ABD/Pelvis 4/08 >> large volume ascites with intraperitoneal free air   Micro Data:  COVID 6/07 >> negative Tracheal aspirate 6/20 >> E Coli sensitive to zosyn BCx2 6/23 >> negative BCx2  6/26 >> negative   Antimicrobials:  Zosyn 6/07 >> 6/17 Diflucan 6/07 >> 6/10  Cefepime 6/21 >>6/26 Vanco 6/21 >> 6/25 Anidulafungin 6/26 >> 7/06 Zosyn 6/26 >>7/10  Interim history/subjective:  Still appears critically ill and to be suffering   Objective   Blood pressure 102/64, pulse (Abnormal) 115, temperature 97.9 F (36.6 C), temperature source Oral, resp. rate 18, height 5\' 4"  (1.626 m), weight 65.1 kg, SpO2 100 %.     Vent Mode: PRVC FiO2 (%):  [30 %] 30 % Set Rate:  [18 bmp] 18 bmp Vt Set:  [430 mL] 430 mL PEEP:  [5 cmH20] 5 cmH20 Plateau Pressure:  [19 cmH20-24 cmH20] 23 cmH20   Intake/Output Summary (Last 24 hours) at 12/27/2018 0912 Last data filed at 12/27/2018 0800 Gross per 24 hour  Intake 5166.72 ml  Output 3712 ml  Net 1454.72 ml   Filed Weights   12/24/18 0500 12/25/18 0500 12/27/18 0500  Weight: 64.3 kg 65 kg 65.1 kg   Physical Exam:  General: Is a chronically ill-appearing 63 female currently ventilator dependent HEENT normocephalic atraumatic no jugular venous distention orally intubated mucous membranes moist Pulmonary: Scattered rhonchi no accessory use Cardiac: Regular rate and rhythm Abdomen: Wound VAC intact GU: Making urine now Neuro: Sedated on vent.  Resolved Hospital Problem list   Metabolic alkalosis, Abdominal peritonitis with septic shock, Circulatory shock, Cardiorespiratory arrest 6/13, Shock liver, Hypophosphatemia, thrombocytopenia  Assessment & Plan:  63 year old critically ill female who remains in the ICU for multiorgan failure related to small bowel perforation s/p multiple attempts at repair, washout, and closure.  She unfortunately has developed recurrent leak without further surgical options.  In addition to this terminal diagnosis, she has a number of other hospital-acquired conditions detailed in other notes.  She is approaching end of life and we are prolonging her suffering.   Ventilator dependence w/ failure to wean  in setting of prolonged critical illness, severe protein calorie malnutrition, metabolic encephalopathy and on-going peritonitis -not a candidate for trach Plan Cont current supportive measures VAP bundle  Full vent support   abd peritonitis 2/2 anastomotic leak  -s/p several OR trips no longer candidate for OR given friability of tissue and failed prior attempts -would require life-long TPN  -completed abx Plan Cont current wound  care as directed by sirg   Acute renal failure  -appears to be making renal recovery Plan I would support d/cing CRRT Would not offer this again as would represent futile care  Anemia of critical illness Plan No longer candidate for transfusion    Best practice:  Diet: TPN. DVT prophylaxis: SCDs GI prophylaxis: Protonix Mobility: Bedrest Code Status: Full code Disposition: ICU.  Nothing new.  Still critically ill.  Looks like making renal recovery but this does not change her overall prognosis given wounds not healing.  She is ventilator dependent and is not a candidate for tracheostomy.  We will continue ongoing supportive care and discussing goals of care with brother  Erick Colace ACNP-BC [place] Pager # [phone] OR # [phone] if no answer

## 2018-12-27 NOTE — Progress Notes (Addendum)
eLink Physician-Brief Progress Note Patient Name: Carmen Stone DOB: 1955/12/05 MRN: [id number]   Date of Service  12/27/2018  HPI/Events of Note  Notified of fever 102.8 with subsequent tachycardia. Cleared with surgery to give acetaminophen suppository. Metoprolol given for tachycardia but became hypotensive.  eICU Interventions  Note of plans of no escalation of care as patient is not a surgical candidate. Will give a trial of 250 NS bolus and albumin 25 g. CVP to be checked and further fluid resuscitation as deemed necessary.     Intervention Category Major Interventions: Arrhythmia - evaluation and management Intermediate Interventions: Other:  Judd Lien 12/27/2018, 11:14 PM

## 2018-12-27 NOTE — Progress Notes (Signed)
Palliative Medicine RN Note: PMT MDs Dr Rowe Pavy and Dr Domingo Cocking have spoken at length with Carmen Stone's family. Her daughter defers to the patient's brother. He is polite and listens but is unwilling to make decisions to de-escalate care.  Discussed Carmen Stone's case in morning rounds, then rec'd a call from [place] this afternoon. If the attending service feels that further care is futile, PMT recommends using the futility policy to guide further action and care. This can be found on the [employer].  Our team will continue to interact with Carmen Stone's brother as is appropriate.  [name], RN, BSN, [facility] Palliative Medicine Team 12/27/2018 3:16 PM Office [phone]

## 2018-12-27 NOTE — Progress Notes (Signed)
At 2100, pt HR-157, BP-96/49. 5mg  metoprolol given IV with HR decreasing to 140s. After metoprolol administration SBP dropped to 80s, then 70s.  Pt CVP-4. Dr. Genevive Bi notified and 250 NS bolused with SBP decreasing to 50s. Fentanyl drip paused at this time and 25g albumin 25% started per MD order.  BP came up to 73/35 @ 2350 and pt began grimacing. Fentanyl gtt restarted at 137mcg (half the dose it was on before it was paused). Albumin still infusing at this time. Will continue to monitor pt.

## 2018-12-27 NOTE — Plan of Care (Signed)
  Problem: Clinical Measurements: Goal: Will remain free from infection Outcome: Not Progressing Note: T-102.8

## 2018-12-27 NOTE — Ethics Note (Signed)
Ethics Note:  This case was passed on to me last 2022/09/13. At the time I read the chart and noted that the brother was to have further conversation with the family and that the patient had been made a DNR. It appears the the brother has not changed in his reluctance to consider withdrawal of treatments.  It appears that all providers are in agreement that continuing aggressive treatment is not beneficial, however comfort care is always appropriate. Noting Palliative Care suggestion to engage the [place] which is to be found in Patient Care Policies listed as Medical Futility Policy, I would suggest an additional family meeting to review case with Ethics consultant present to lead that portion of the conversation. This is part of the Futility Policy protocol prior to decisions to withdraw or not continue non-beneficial treatments. The emphasis should be made that it is only about utilizing non-beneficial treatments since " care" is never futile.   Please page the Ethics Pager if you wish to proceed with this protocol  [phone].  [facility] Ethics Committee Consult Service.

## 2018-12-27 NOTE — Progress Notes (Signed)
Patients brother and daughter, Iona Beard and Darden Amber, arrived around 1745 to visit with the patient.  Iona Beard expressed concern that patients CRRT had been stopped.  I explained why the CRRT was stopped and the current improved condition of her Renal system.  I also referenced a phone call I had with Iona Beard earlier where he was told the CRRT would be discontinued.  Iona Beard denied that he was told CRRT would be discontinued.  However, Iona Beard was updated via phone call about the pending discontinuation of CRRT and he voiced understanding.  Iona Beard stated, "my wishes were to continue life support" and "you are not respecting our wishes".  I explained that the CRRT was not life support and that her renal function had improved and continuing CRRT posed more risk than stopping.  Iona Beard also expressed frustration that "life support" medication was stopped.  I explained the only drip that had been stopped was the calcium drip correlated to the CRRT anticoagulation.  I discussed at length the patients care for the day and future plans and reinforced the fact that no life support would be stopped without his permission.  Iona Beard thanked me for my time and then stated "I understand, I understand you stopped CRRT because someone else needs that machine, I just want to keep her on life support and be consulted before life support is stopped, how do I even now if she'll be on the ventilator in the morning if you all are making the decisions".  Again, I reiterated to Iona Beard that life support was not stopped and that he would decide when life support was stopped.  I again reviewed the care the patient was receiving and the plan/goals for the following night.

## 2018-12-27 NOTE — Progress Notes (Signed)
[facility] ROUNDING NOTE   Made 990 of urine yesterday - 230 so far today but ended up positive on CRRT.  Talking about possible one way extubation   Objective:  Vital signs in last 24 hours:  Temp:  [97.7 F (36.5 C)-99.2 F (37.3 C)] 97.9 F (36.6 C) (07/13 1200) Pulse Rate:  [101-132] 124 (07/13 1300) Resp:  [17-30] 17 (07/13 1300) BP: (95-148)/(48-87) 119/65 (07/13 1300) SpO2:  [96 %-100 %] 100 % (07/13 1300) FiO2 (%):  [30 %] 30 % (07/13 1057) Weight:  [65.1 kg] 65.1 kg (07/13 0500)  Weight change:  Filed Weights   12/24/18 0500 12/25/18 0500 12/27/18 0500  Weight: 64.3 kg 65 kg 65.1 kg    Intake/Output: I/O last 3 completed shifts: In: 8144.4 [I.V.:8144.4] Out: 9326 [ZTIWP:8099; Emesis/NG output:255; IPJAS:5053; Stool:295]   Intake/Output this shift:  Total I/O In: 958.3 [I.V.:958.3] Out: 796 [Urine:230; Emesis/NG output:25; Other:541]  Exam: Intubated and sedated CVS- RRR no JVP RS- CTA ant and lat ABD- BS present soft non-distended EXT-1-2+ lower extremity edema   Assessment/ Plan:   Acute kidney injury/ ATN:  She has required CRRT since 12/01/2018.  Baseline serum creatinine 0.9.  Acute kidney injury following perforated small bowel with fecal peritonitis and septic shock. Patient now has no surgical options and will be unable to survive this illness per other providers. Dialysis currently is prolonging her suffering. Support transition to comfort care and withdrawal of dialysis/ CRRT.  Since making urine, I think OK to stop CRRT - have discussed with nursing-  Line has been in since 6/9 !!! So would remove it as well as will not be re initiating CRRT  Perforated small bowel - multiple surgeries w/ significant SB resection  Hypotension/volume: no vol excess on exam. 10kg under admit wt, euvolemic on exam. Getting TNA w/ volume load. No longer on pressors   Anemia - transfuse prn  CM EF 20%  Diabetes mellitus per primary team  Nutrition  continues on TNA  Hypophosphatemia: resolved   Renal will sign off as there is no further utility to continued dialysis- call if questions     Louis Meckel  12/27/2018, 2:04 PM     Basic Metabolic Panel: Recent Labs  Lab 12/23/18 0435  12/24/18 0300  12/25/18 0258  12/25/18 1700  12/26/18 0547  12/26/18 1510 12/26/18 1934 12/26/18 1945 12/27/18 0350 12/27/18 0351 12/27/18 0354 12/27/18 0401  NA 138   < > 140   < > 145   < > 144   < > 143   < > 143 140 142 145  --  141 141  K 3.4*   < > 3.7   < > 3.5   < > 3.7   < > 4.2   < > 4.2 3.9 3.8 4.0  --  3.5 3.9  CL 79*   < > 77*   < > 81*   < > 85*   < > 83*   < > 82* 80* 87* 84*  --  81* 82*  CO2 41*   < > 44*   < > 49*  --  46*  --  44*  --  43*  --   --  46*  --   --   --   GLUCOSE 187*   < > 166*   < > 124*   < > 154*   < > 151*   < > 151* 261* 155* 195*  --  266* 186*  BUN 33*   < > 32*   < > 31*   < > 30*   < > 31*   < > 32* 22 24* 30*  --  21 25*  CREATININE 0.87   < > 0.89   < > 0.79   < > 0.71   < > 0.73   < > 0.80 0.60 0.60 0.79  --  0.60 0.70  CALCIUM 12.3*   < > 10.0   < > 10.0  --  9.1  --  9.2  --  9.3  --   --  9.2  --   --   --   MG 1.8  --  2.0  --  1.8  --   --   --  2.0  --   --   --   --   --  2.0  --   --   PHOS 2.5   < > 2.7   < > 2.2*  --  3.1  --  2.6  --  2.5  --   --  2.5  --   --   --    < > = values in this interval not displayed.    Liver Function Tests: Recent Labs  Lab 12/23/18 0435  12/25/18 1700 12/26/18 0547 12/26/18 1510 12/27/18 0350 12/27/18 0351  AST 20  --   --   --   --   --  28  ALT 16  --   --   --   --   --  20  ALKPHOS 178*  --   --   --   --   --  268*  BILITOT 0.5  --   --   --   --   --  0.4  PROT 5.6*  --   --   --   --   --  6.3*  ALBUMIN 1.2*  1.2*   < > 1.2* 1.2* 1.3* 1.2* 1.2*   < > = values in this interval not displayed.   No results for input(s): LIPASE, AMYLASE in the last 168 hours. No results for input(s): AMMONIA in the last 168  hours.  CBC: Recent Labs  Lab 12/23/18 0435  12/24/18 0300  12/25/18 0258  12/26/18 0547  12/26/18 1934 12/26/18 1945 12/27/18 0351 12/27/18 0354 12/27/18 0401  WBC 19.6*  --  16.6*  --  20.1*  --  24.7*  --   --   --  26.8*  --   --   NEUTROABS  --   --   --   --   --   --   --   --   --   --  25.2*  --   --   HGB 7.5*   < > 7.5*   < > 7.3*   < > 6.8*   < > 8.8* 6.5* 6.9* 7.5* 6.8*  HCT 24.0*   < > 23.8*   < > 24.4*   < > 22.7*   < > 26.0* 19.0* 22.8* 22.0* 20.0*  MCV 97.2  --  98.8  --  101.2*  --  101.8*  --   --   --  102.2*  --   --   PLT 252  --  253  --  271  --  287  --   --   --  298  --   --    < > = values in this interval not displayed.  Medications:   .  prismasol BGK 4/2.5 200 mL/hr at 12/26/18 1940  . sodium chloride Stopped (12/25/18 0957)  . calcium gluconate infusion for CRRT 140 mL/hr at 12/27/18 1100  . fentaNYL infusion INTRAVENOUS 150 mcg/hr (12/27/18 1300)  . prismasol B22GK 4/0 1,500 mL/hr at 12/27/18 0135  . sodium citrate 2 %/dextrose 2.5% solution 3000 mL 700 mL/hr at 12/27/18 0948  . TPN ADULT (ION) 75 mL/hr at 12/27/18 1300  . TPN ADULT (ION)     . chlorhexidine gluconate (MEDLINE KIT)  15 mL Mouth Rinse BID  . Chlorhexidine Gluconate Cloth  6 each Topical Daily  . dorzolamide-timolol  1 drop Both Eyes BID  . insulin aspart  3-9 Units Subcutaneous Q6H  . levothyroxine  12.5 mcg Intravenous Daily  . mouth rinse  15 mL Mouth Rinse 10 times per day  . pantoprazole (PROTONIX) IV  40 mg Intravenous Q24H   sodium chloride, albuterol, alteplase, fentaNYL, heparin, hydrALAZINE, metoprolol tartrate, sodium chloride, sodium chloride flush

## 2018-12-27 NOTE — Progress Notes (Addendum)
PHARMACY - ADULT TOTAL PARENTERAL NUTRITION CONSULT NOTE   Pharmacy Consult for TPN Indication: prolonged ileus  HPI: 46 yoF admitted 6/7 with perforated small bowel from closed-loop obstruction and underwent emergent exploratory laparotomy. Returned to OR on 6/9 and again on 6/14 for closure of abdomen.  Patient coded on December 09, 2022 and developed multiorgan failure. Remains intubated & on CRRT. Pressors have been weaned off and she is completing antibiotic course for sepsis/PNA.  NPO since admission on 6/7. Pharmacy consulted to start TPN 6/16. D/t acute liver injury from shock, initiated TPN at low rate & advance slowly as requested by surgery.  Patient Measurements: Height: '5\' 4"'  (162.6 cm) Weight: 143 lb 8.3 oz (65.1 kg) IBW/kg (Calculated) : 54.7 TPN AdjBW (KG): 59.7 Body mass index is 24.64 kg/m. Usual Weight: 90kg  Significant events:  6/18: OK to increase to goal rate per surgery 6/22: CRRT stopped for holiday; CBG 69 overnight, D50 given but TPN continued (55 units insulin in TPN); BS and BM overnight - initiating trickle feeds 6/23: resuming CRRT, return to high-protein TPN; fevers overnight, CCM removing some central lines; will use chemo port for TPN 6/24: began adding lipids to TPN formula 6/25: trickle feeds stopped d/t abd distention 6/26: CRRT clotted off overnight; patient to OR this AM for ex lap; RN attempting to restart CRRT post-op but still having issues at this time 6/27: CRRT appears to have been successfully resumed overnight 6/29 OR pus throughout abdomen. Small hole in small bowel 7/3 OR wash out of abd, replace wound VAC, abd remains open 7/6 sched for OR today for wash out abd, replace wound VAC 7/8 new leak at surgical site; patient is not a candidate for additional surgeries. To continue TPN pending [place] discussion  Central access: 11/30/2018 TPN start date: 11/30/18  ASSESSMENT                                                                                                           Current Nutrition: NPO IVF: On CRRT to keep slightly positive  Today:   Glucose (goal 100-150) - Hx DM on 70/30 Novolog 20 units BID PTA.  CBGs (range 103-176 fingerstick)  55 units insulin in TPN + ICU resistant scale; 15 units SSI required yesterday  Electrolytes - on CRRT; all WNL except remains Cl deficient, Ca being replaced via CRRT  Renal - on CRRT; bicarb elevated (suspect Cl depletion alkalosis)  LFTs - albumin remains low; alk phos elevated and trending up, others WNL (7/13)  TGs - 166 (7/13)  Prealbumin -down to 7.8 (7/13) despite full support: reflects inflammation and critical illness  NUTRITIONAL GOALS  RD recs: (6/29):  Kcal:1493 kcal, Protein:195-215 gram Fluid:>/= 1.8 L/day  PLAN                                                                                                                          At 1800 today:   Continue TPN at 75 ml/hr   Custom TPN with lipids at goal rate of 75 ml/hr provides: 198 g/day protein, 1494 Kcal/day = 100% support  Electrolytes: max K, Mg, Phos; remove Ca (will let this be controlled by Citrate & Ca infusion titration); Cl:Ac = max Cl  TPN to contain standard MVI daily, trace elements MWF only due to national backorder.  IVF per MD - CRRT fluids  Continue ICU resistant-scale glycemic control orders q6h; continue 55 units regular insulin in TPN  Patient has had episodes of hypoglycemia previously; low threshold to pause TPN if becoming hypoglycemic  TPN lab panels Mondays & Thursdays  Ordered BID renal function panels and frequent iStat labs while on CRRT  Eudelia Bunch, Pharm.D [phone] 12/27/2018 11:11 AM    Addendum: CRRT has been stopped. TPN already made for today at [facility].  Will run at lower rate of 20 ml/hr and check am BMET, Mg and Phos  Eudelia Bunch,  Pharm.D [phone] 12/27/2018 3:07 PM

## 2018-12-28 LAB — BASIC METABOLIC PANEL
Anion gap: 13 (ref 5–15)
BUN: 62 mg/dL — ABNORMAL HIGH (ref 8–23)
CO2: 40 mmol/L — ABNORMAL HIGH (ref 22–32)
Calcium: 8.2 mg/dL — ABNORMAL LOW (ref 8.9–10.3)
Chloride: 91 mmol/L — ABNORMAL LOW (ref 98–111)
Creatinine, Ser: 1.84 mg/dL — ABNORMAL HIGH (ref 0.44–1.00)
GFR calc Af Amer: 33 mL/min — ABNORMAL LOW (ref >60)
GFR calc non Af Amer: 28 mL/min — ABNORMAL LOW (ref >60)
Glucose, Bld: 146 mg/dL — ABNORMAL HIGH (ref 70–99)
Potassium: 5.6 mmol/L — ABNORMAL HIGH (ref 3.5–5.1)
Sodium: 144 mmol/L (ref 135–145)

## 2018-12-28 LAB — GLUCOSE, CAPILLARY
Glucose-Capillary: 113 mg/dL — ABNORMAL HIGH (ref 70–99)
Glucose-Capillary: 156 mg/dL — ABNORMAL HIGH (ref 70–99)
Glucose-Capillary: 31 mg/dL — CL (ref 70–99)
Glucose-Capillary: 59 mg/dL — ABNORMAL LOW (ref 70–99)
Glucose-Capillary: 67 mg/dL — ABNORMAL LOW (ref 70–99)
Glucose-Capillary: 73 mg/dL (ref 70–99)
Glucose-Capillary: 77 mg/dL (ref 70–99)
Glucose-Capillary: 90 mg/dL (ref 70–99)

## 2018-12-28 LAB — CALCIUM, IONIZED
Calcium, Ionized, Serum: 5 mg/dL (ref 4.5–5.6)
Calcium, Ionized, Serum: 4.3 mg/dL — ABNORMAL LOW (ref 4.5–5.6)

## 2018-12-28 LAB — PHOSPHORUS: Phosphorus: 4.6 mg/dL (ref 2.5–4.6)

## 2018-12-28 LAB — MAGNESIUM: Magnesium: 2.2 mg/dL (ref 1.7–2.4)

## 2018-12-28 MED ORDER — DEXTROSE 50 % IV SOLN
25.0000 g | INTRAVENOUS | Status: AC
  Administered 2018-12-28: 25 g via INTRAVENOUS

## 2018-12-28 MED ORDER — DEXTROSE 50 % IV SOLN
INTRAVENOUS | Status: AC
  Filled 2018-12-28: qty 50

## 2018-12-28 MED ORDER — TRAVASOL 10 % IV SOLN
INTRAVENOUS | Status: AC
  Administered 2018-12-28: 18:00:00 via INTRAVENOUS
  Filled 2018-12-28: qty 480

## 2018-12-28 MED ORDER — SODIUM CHLORIDE 0.9 % IV BOLUS
500.0000 mL | Freq: Once | INTRAVENOUS | Status: AC
  Administered 2018-12-28: 500 mL via INTRAVENOUS

## 2018-12-28 MED ORDER — DEXTROSE 50 % IV SOLN
INTRAVENOUS | Status: AC
  Administered 2018-12-28: 25 mL
  Filled 2018-12-28: qty 50

## 2018-12-28 NOTE — Progress Notes (Addendum)
NAME:  Carmen Stone, MRN:  [id number], DOB:  10-Sep-1955, LOS: 41 ADMISSION DATE:  12/03/2018, CONSULTATION DATE:  11/21/2018 REFERRING MD:  Dr. Ninfa Linden, Surgery, CHIEF COMPLAINT:  Abdominal pain   Brief History   63 y/o female presented to ER 11/17/2018 with nausea, vomiting, abdominal pain.  CT abd/pelvis showed focal perforated loop of SB with free air with multiple complex ventral hernias.  Underwent emergent laparotomy with SB resection, lysis of adhesions and wound vac.  Remained on vent and pressors post op.  Hospital course complicated by respiratory arrest 6/13, multiple reintubations, intermittent vasopressor needs, AKI on CVVHD.     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 Changing volume goal on CRRT to -100 cc an hour. Weaned 7 hours PSV   6/16 Encephalopathic. Hypertensive, adding labetalol.  IV TNA started 6/17 A little more awake.  Tolerating PSV but SBI exceeds 105.  Volume removal with CVVHD 6/18 Neuro status improved.  Required brief vasoactive drips.  Net negative fluid volume status.PSV 6/19 Extubated 6/20 reintubated for altered mentation, head CT negative  6/21 Tx PRBC x1.  Off vasopressors 6/23 Febrile overnight, Blood Cx. Transfuse 1 U PRBC 6/24 Afebrile. Remains off pressors. 100cc/hr removal /c CRRT. 30% FiO2.  6/25 L IJ, subclavian, axillary  + DVT. Heparin initiated. Remains on CRRT.  6/26  CT ABD/pelvis positive for free air and ascites. Back to OR  for ex lap of perforated viscous, resection of previous anastomosis, application of wound vac.  1U RBCs intra op.   6/28 Back on pressors with hgb <7 / sedated on fent drip, PRBC 6/29 Pending OR for possible closure  7/01 Less sedation, following commands, abd open with VAC, on CVVHD (even) 7/03 OR, unable to close but bowel "looks some better", hematoma in abd, on CVVHD 7/05 hemoglobin drop with red drainage from wound vac, heparin gtt held, transfuse PRBC 7/06 laparotomy, washout, SB anastomosis 7/13 no sig change. Making urine.  7/14 Spiked a fever over night to 102.3, BP soft requiring 500 cc bolus and albumin, CVVH off as she is making urine Consults:  Renal 6/09 AKI, acidosis Cardiology 6/09 Takotsubo CM  Palliative care 6/30 Goals of care  Procedures:  ETT 6/07 >> 6/19, 6/20 >> Rt IJ HD 6/09 >> Rt [place] CVL 6/09 >> Lt radial aline 6/09 >> discontinued  Significant Diagnostic Tests:  CT abd/pelvis 6/07 >> focal perforated loop of SB with free air with multiple complex ventral hernias Echo 6/08 >> EF 20%, Takotsubo CM CT head 6/20 >> negative for any acute findings CT abdomen 6/20 >> bilateral effusions, atelectasis left lower lobe, results noted.    CT Chest 6/20 >> small bilateral pleural effusions with compressive atelectasis, anterior R 4-7 rib fractures CT ABD/Pelvis 6/20 >> small bowel surgical changes, mild circumferential wall thickening of scattered small bowel loops, small to moderate ascites LUE Duplex 6/24 >> DVT L IJ, subclavian, axillary, SVT L cephalic  CT ABD/Pelvis 4/19 >> large volume ascites with intraperitoneal free air   Micro Data:  COVID 6/07 >> negative Tracheal aspirate 6/20 >> E Coli sensitive  to zosyn BCx2 6/23 >> negative BCx2  6/26 >> negative   Antimicrobials:  Zosyn 6/07 >> 6/17 Diflucan 6/07 >> 6/10 Cefepime 6/21 >>6/26 Vanco 6/21 >> 6/25 Anidulafungin 6/26 >> 7/06 Zosyn 6/26 >>7/10  Interim history/subjective:  Still appears critically ill and to be suffering  Spiked a fever to 102.3, BP soft treated with fluid( CVP was 4, now 6  after 500 cc bolus and albumin 25 grams) Fentanyl at 300 mcg + 4500 cc  Labs dc'd per surgery  Objective   Blood pressure (!) 72/39, pulse (!) 110, temperature 99.5 F (37.5 C), temperature source Oral, resp. rate 18, height 5\' 4"  (1.626 m), weight 65.1 kg, SpO2 99 %. CVP:  [4 mmHg-6 mmHg] 6 mmHg  Vent Mode: PRVC FiO2 (%):  [30 %] 30 % Set Rate:  [16 bmp-18 bmp] 18 bmp Vt Set:  [430 mL] 430 mL PEEP:  [5 cmH20] 5 cmH20 Plateau Pressure:  [20 cmH20-27 cmH20] 20 cmH20   Intake/Output Summary (Last 24 hours) at 12/28/2018 0838 Last data filed at 12/28/2018 0700 Gross per 24 hour  Intake 3143.44 ml  Output 2655 ml  Net 488.44 ml   Filed Weights   12/24/18 0500 12/25/18 0500 12/27/18 0500  Weight: 64.3 kg 65 kg 65.1 kg   Physical Exam:  General: Is a chronically ill-appearing 53 female currently ventilator dependent, sedated  HEENT normocephalic atraumatic no jugular venous distention orally intubated mucous membranes moist, No LAD, Right HD cath noted Pulmonary: Bilateral chest excursion, Scattered rhonchi no accessory use, not overbreathing the vent Cardiac:S1, S2,  Regular rate and rhythm, No RMG Abdomen: Wound VAC intact with purulent drainage , BS diminished , + tender, TPN infusing  GU: Clear amber Neuro: Sedated on vent.  Resolved Hospital Problem list   Metabolic alkalosis, Abdominal peritonitis with septic shock, Circulatory shock, Cardiorespiratory arrest 6/13, Shock liver, Hypophosphatemia, thrombocytopenia  Assessment & Plan:  63 year old critically ill female who remains in the ICU for multiorgan failure related to small bowel perforation s/p multiple attempts at repair, washout, and closure.  She unfortunately has developed recurrent leak without further surgical options.  In addition to this terminal diagnosis, she has a number of other hospital-acquired conditions detailed in other notes.  She is approaching end of life and we are prolonging her suffering.    Ventilator dependence w/ failure to wean  in setting of prolonged critical illness, severe protein calorie malnutrition, metabolic encephalopathy and on-going peritonitis -not a candidate for trach Plan Cont current supportive measures VAP bundle  Full vent support  Goals of care   abd peritonitis 2/2 anastomotic leak  -s/p several OR trips no longer candidate for OR given friability of tissue and failed prior attempts -would require life-long TPN  -completed abx Plan Cont current wound care as directed by surg   Acute renal failure  -appears to be making renal recovery - CRRT discontinued 7/13 Plan Trend UO Would not offer this again as would represent futile care  Anemia of critical illness Plan No longer candidate for transfusion  TNA Plan Will need to trend BMET, Mag and Phos for continued TNA therapy  For family conference today at 14:30. Pt spiked a temp through the night, with drop in BP. Will continue to support with fluids as no escalation of care . Goals of care conversation with need for palliation as patient is declaring herself daily.  Best practice:  Diet: TPN. DVT prophylaxis: SCDs GI prophylaxis: Protonix Mobility: Bedrest Code Status: Full code Disposition:  ICU.  Spiked temp overnight.  Still critically ill.  Looks like making renal recovery but this does not change her overall prognosis given wounds not healing.  She is ventilator dependent and is not a candidate for tracheostomy. I suspect she has developed sepsis. He wound is draining what appears to be stool.  We will continue ongoing supportive care and discussing goals of care with brother  Magdalen Spatz, [facility] [place] Pager # [phone] After 4 pm please call [phone] 12/28/2018 8:38 AM

## 2018-12-28 NOTE — Progress Notes (Signed)
PHARMACY - ADULT TOTAL PARENTERAL NUTRITION CONSULT NOTE   Pharmacy Consult for TPN Indication: prolonged ileus  HPI: 7 yoF admitted 6/7 with perforated small bowel from closed-loop obstruction and underwent emergent exploratory laparotomy. Returned to OR on 6/9 and again on 6/14 for closure of abdomen.  Patient coded on 05-Dec-2022 and developed multiorgan failure. Remains intubated & on CRRT. Pressors have been weaned off and she is completing antibiotic course for sepsis/PNA.  NPO since admission on 6/7. Pharmacy consulted to start TPN 6/16. D/t acute liver injury from shock, initiated TPN at low rate & advance slowly as requested by surgery.  Patient Measurements: Height: _0  (162.6 cm) Weight: 143 lb 8.3 oz (65.1 kg) IBW/kg (Calculated) : 54.7 TPN AdjBW (KG): 59.7 Body mass index is 24.64 kg/m. Usual Weight: 90kg  Significant events:  6/18: OK to increase to goal rate per surgery 6/22: CRRT stopped for holiday; CBG 69 overnight, D50 given but TPN continued (55 units insulin in TPN); BS and BM overnight - initiating trickle feeds 6/23: resuming CRRT, return to high-protein TPN; fevers overnight, CCM removing some central lines; will use chemo port for TPN 6/24: began adding lipids to TPN formula 6/25: trickle feeds stopped d/t abd distention 6/26: CRRT clotted off overnight; patient to OR this AM for ex lap; RN attempting to restart CRRT post-op but still having issues at this time 6/27: CRRT appears to have been successfully resumed overnight 6/29 OR pus throughout abdomen. Small hole in small bowel 7/3 OR wash out of abd, replace wound VAC, abd remains open 7/6 sched for OR today for wash out abd, replace wound VAC 7/8 new leak at surgical site; patient is not a candidate for additional surgeries. To continue TPN pending [employer] discussion 7/13 CRRT dc'd 7/14 septic overnight. Hypoglycemic. Wound draining what appears to be stool  Central access: 11/15/2018 TPN start date:  11/30/18  ASSESSMENT                                                                                                          Current Nutrition: NPO, TPN IVF: none  Today:   Glucose (goal 100-150) - Hx DM on 70/30 Novolog 20 units BID PTA.  Multiple episodes of hypoglycemia requiring D50 after CRRT stopped 7/13  ICU resistant scale q6h  Electrolytes - K 5.6; phos 4.6, mag 2.2  Renal - CRRT stopped 7/13, SCr 0.7>1.84, 975 mls UOP/24 hrs  LFTs - albumin remains low; alk phos elevated and trending up, others WNL (7/13)  TGs - 166 (7/13)  Prealbumin -down to 7.8 (7/13) despite full support: reflects inflammation and critical illness  NUTRITIONAL GOALS  RD recs: (6/29):  Kcal:1493 kcal, Protein:195-215 gram Fluid:>/= 1.8 L/day  PLAN                                                                                                                          At 1800 today:  TPN at 40 ml/hr with no electrolytes (x Na) and no insulin.  Provides ~ 48 gm protein and ~ 970 kcals  Electrolytes:  take all lytes out of TPN x Na: Cl:Ac = max Cl  TPN to contain standard MVI daily, trace elements MWF only due to national backorder.  IVF per MD  Continue ICU resistant-scale glycemic control orders q6h- or per MD  take insulin out of TPN 7/14  Labs DC'd by [facility].  Eudelia Bunch, Pharm.D [phone] 12/28/2018 10:15 AM

## 2018-12-28 NOTE — Progress Notes (Signed)
Patient's brother, Iona Beard, called for an update.  Iona Beard updated regarding pt's T-102.8, HR-140s, and SBP-80s.

## 2018-12-28 NOTE — Progress Notes (Signed)
Daily Progress Note   Patient Name: Carmen Stone       Date: 12/28/2018 DOB: [birth year]  Age: 63 y.o. MRN#: [id number] Attending Physician: Nolon Nations, MD Primary Care Physician: Jettie Booze, NP Admit Date: 12/03/2018  Reason for Consultation/Follow-up: Establishing goals of care  Subjective: Carmen Stone is resting in bed, she appears chronically ill, doesn't arouse or engage.   I discussed with brother and daughter at the bedside.  I expressed again concern about how critically ill Carmen Stone is and that I believe she is dying.  Family acknowledged this has been shared with them by multiple providers.  Iona Beard remains polite and reports being appreciative of information, but states he has no questions and wants to continue with current interventions.    Length of Stay: 37  Current Medications: Scheduled Meds:  . chlorhexidine gluconate (MEDLINE KIT)  15 mL Mouth Rinse BID  . Chlorhexidine Gluconate Cloth  6 each Topical Daily  . dorzolamide-timolol  1 drop Both Eyes BID  . insulin aspart  3-9 Units Subcutaneous Q6H  . levothyroxine  12.5 mcg Intravenous Daily  . mouth rinse  15 mL Mouth Rinse 10 times per day  . pantoprazole (PROTONIX) IV  40 mg Intravenous Q24H    Continuous Infusions: . sodium chloride Stopped (12/25/18 0957)  . fentaNYL infusion INTRAVENOUS 200 mcg/hr (12/28/18 1700)  . TPN ADULT (ION) 40 mL/hr at 12/28/18 1759    PRN Meds: sodium chloride, acetaminophen, albuterol, fentaNYL, fentaNYL, hydrALAZINE, metoprolol tartrate, midazolam, sodium chloride flush  Physical Exam         General: Intubated.  Heart: Regular rate and rhythm.  Lungs: Good air movement on vent. Ext: No significant edema Skin: Warm and dry  Vital Signs: BP (!) 87/45   Pulse (!)  117   Temp 99.5 F (37.5 C) (Oral)   Resp 18   Ht _0  (1.626 m)   Wt 65.1 kg   SpO2 100%   BMI 24.64 kg/m  SpO2: SpO2: 100 % O2 Device: O2 Device: Ventilator O2 Flow Rate: O2 Flow Rate (L/min): 4 L/min  Intake/output summary:   Intake/Output Summary (Last 24 hours) at 12/28/2018 1826 Last data filed at 12/28/2018 1700 Gross per 24 hour  Intake 1960.76 ml  Output 555 ml  Net 1405.76 ml   LBM: Last BM  Date: 12/28/18 Baseline Weight: Weight: 74.8 kg Most recent weight: Weight: 65.1 kg       Palliative Assessment/Data:    Flowsheet Rows     Most Recent Value  Intake Tab  Referral Department  Surgery  Unit at Time of Referral  ICU  Palliative Care Primary Diagnosis  Sepsis/Infectious Disease  Date Notified  12/09/2018  Palliative Care Type  New Palliative care  Reason for referral  Clarify Goals of Care  Date of Admission  11/20/2018  Date first seen by Palliative Care  12/14/18  # of days Palliative referral response time  1 Day(s)  # of days IP prior to Palliative referral  22  Clinical Assessment  Palliative Performance Scale Score  10%  Psychosocial & Spiritual Assessment  Palliative Care Outcomes  Palliative Care Outcomes  Clarified goals of care      Patient Active Problem List   Diagnosis Date Noted  . Dyspnea   . Palliative care by specialist   . Goals of care, counseling/discussion   . Acute respiratory failure with hypoxia and hypercapnia ([place]) 12/12/2018  . Toxic metabolic encephalopathy 25/10/3974  . Thrombocytopenia, acquired ([place]) 12/12/2018  . Small bowel perforation ([place])   . Pressure injury of skin 11/29/2018  . Encounter for central line placement   . Septic shock ([place])   . AKI (acute kidney injury) ([place])   . S/P dialysis catheter insertion ([place])   . 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 Perforated viscus 11/15/2018  . Port-A-Cath in place 08/03/2017  . Port catheter in place 01/31/2016  .  Hypersensitivity reaction 02/08/2014  . Drug induced neutropenia(288.03) 01/30/2014  . Nausea with vomiting 11/04/2013  . 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 ATN (acute tubular necrosis) 11/02/2013  . Acute respiratory failure ([place]) 11/02/2013  . Acute respiratory failure with hypoxia ([place]) 10/25/2013  . Abnormal urinalysis 10/25/2013  . Postoperative anemia due to acute blood loss 10/24/2013  . Breast abscess of female 10/22/2013  . HYPERLIPIDEMIA 07/16/2010  . ATELECTASIS 07/16/2010  . Type 2 diabetes mellitus ([place]) 06/24/2010  . 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 HYPOXEMIA 06/24/2010  . SMALL BOWEL OBSTRUCTION, HX OF 06/24/2010    Palliative Care Assessment & Plan   Patient Profile: 63 y.o. female  with past medical history of  admitted on 12/08/2018 with abdominal pain from perforation and complicated clinical course with multiple surgeries requiring resection of large portion of small bowel, code blue, and multisystem organ failure (EF 20% and vent dependent).  CRRT stopped with no plan to restart.     Assessment: Patient Active Problem List   Diagnosis Date Noted  . Dyspnea   . Palliative care by specialist   . Goals of care, counseling/discussion   . Acute respiratory failure with hypoxia and hypercapnia ([place]) 12/12/2018  . Toxic metabolic encephalopathy 73/41/9379  . Thrombocytopenia, acquired ([place]) 12/12/2018  . Small bowel perforation ([place])   . Pressure injury of skin 11/29/2018  . Encounter for central line placement   . Septic shock ([place])   . AKI (acute kidney injury) ([place])   . S/P dialysis catheter insertion ([place])   . 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 Perforated viscus 11/26/2018  . Port-A-Cath in place 08/03/2017  . Port catheter in place 01/31/2016  . Hypersensitivity reaction 02/08/2014  . Drug induced neutropenia(288.03) 01/30/2014  . Nausea with vomiting 11/04/2013  . Inflammatory breast cancer  ([place]) 11/02/2013  . ATN (acute tubular necrosis) 11/02/2013  .  Acute respiratory failure ([place]) 11/02/2013  . Acute respiratory failure with hypoxia ([place]) 10/25/2013  . Abnormal urinalysis 10/25/2013  . Postoperative anemia due to acute blood loss 10/24/2013  . Breast abscess of female 10/22/2013  . HYPERLIPIDEMIA 07/16/2010  . ATELECTASIS 07/16/2010  . Type 2 diabetes mellitus ([place]) 06/24/2010  . 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 HYPOXEMIA 06/24/2010  . SMALL BOWEL OBSTRUCTION, HX OF 06/24/2010    Recommendations/Plan: - DNR - Multiple discussions held with family by multiple providers that Ms. Clegg is approaching end of life regardless of interventions moving forward. - Brother notes he will continue to discuss with family about potential transition to comfort care, but he wishes to continue with current interventions.  I believe that she will die on the ventilator in the near future regardless of interventions moving forward.  Ethics is aware of case and has noted that meeting with family and ethics would be next step if it is felt most appropriate to pursue Medical Futility Policy.     - Offered active listening and supportive care. All of their questions addressed to the best of my ability.      Code Status:    Code Status Orders  (From admission, onward)         Start     Ordered   12/03/2018 1515  Full code  Continuous     12/11/2018 1515        Code Status History    Date Active Date Inactive Code Status Order ID Comments User Context   12/14/2018 1512 11/30/2018 1515 Full Code [id number]  Chesley Mires, MD Inpatient   Advance Care Planning Activity      Now DNR Prognosis:   Poor  Discharge Planning:  Anticipated hospital death.   Care plan was discussed with brother, bedside RN and daughter.    Thank you for allowing the Palliative Medicine Team to assist in the care of this patient.   Time In: 1530 Time Out: 1600 Total Time 30  Prolonged Time Billed No       Greater than 50%  of this time was spent counseling and coordinating care related to the above assessment and plan.  Micheline Rough, MD [facility] Team [phone]  Please contact Palliative Medicine Team phone at [phone] for questions and concerns.

## 2018-12-28 NOTE — Progress Notes (Signed)
Hypoglycemic Event  CBG: 59  Treatment: D50 50 mL (25 gm)  Symptoms: None  Follow-up CBG: QUIV:[id number] CBG Result:156  Possible Reasons for Event: Unknown  Comments/MD notified:per protocol    Carmen Stone

## 2018-12-28 NOTE — Progress Notes (Signed)
Patient's brother, Iona Beard, called for an update. Iona Beard updated regarding pt's SBP-60s.

## 2018-12-28 NOTE — Progress Notes (Signed)
Hypoglycemic Event  CBG: 67  Treatment: 12.5mg  D50 IV  Symptoms: None  Follow-up CBG: Time: 1000 CBG Result:113  Possible Reasons for Event: TNA  Comments/MD notified: N/A    Arthor Captain Brunette Lavalle

## 2018-12-28 NOTE — Progress Notes (Addendum)
[facility] Progress Note  8 Days Post-Op  Subjective: CC-  Patient febrile and hypotensive over Stone.  Patient's family met with Dr. Lucia Gaskins last Stone, Carmen brother does not wish to make any changes to plan of care.  Objective: Vital signs in last 24 hours: Temp:  [97.9 F (36.6 Stone F (39.4 C)] 99.5 F (37.5 C) (07/14 0115) Pulse Rate:  [109-153] 110 (07/14 0700) Resp:  [15-30] 18 (07/14 0700) BP: (55-126)/(27-68) 72/39 (07/14 0700) SpO2:  [96 %-100 %] 99 % (07/14 0700) FiO2 (%):  [30 %] 30 % (07/14 0725) Last BM Date: 12/26/18  Intake/Output from previous day: 07/13 0701 - 07/14 0700 In: 3233.4 [I.V.:2293.4; NG/GT:90; IV Piggyback:850] Out: 2818 [Urine:935; Emesis/NG output:350; Stool:650] Intake/Output this shift: No intake/output data recorded.  PE: Gen:  On the vent Pulm:  coarse breath sounds bilaterally, mechanically ventilated Cardio: tachy Abd: Soft, Eakins pouch to suction with good seal/ feculent drainage Ext:  1+ edema BLE/BUE Skin: warm and dry  Neuro: not following commands  Lab Results:  Recent Labs    12/26/18 0547  12/27/18 0351 12/27/18 0354 12/27/18 0401  WBC 24.7*  --  26.8*  --   --   HGB 6.8*   < > 6.9* 7.5* 6.8*  HCT 22.7*   < > 22.8* 22.0* 20.0*  PLT 287  --  298  --   --    < > = values in this interval not displayed.   BMET Recent Labs    12/26/18 1510  12/27/18 0350 12/27/18 0354 12/27/18 0401  NA 143   < > 145 141 141  K 4.2   < > 4.0 3.5 3.9  CL 82*   < > 84* 81* 82*  CO2 43*  --  46*  --   --   GLUCOSE 151*   < > 195* 266* 186*  BUN 32*   < > 30* 21 25*  CREATININE 0.80   < > 0.79 0.60 0.70  CALCIUM 9.3  --  9.2  --   --    < > = values in this interval not displayed.   PT/INR No results for input(s): LABPROT, INR in the last 72 hours. CMP     Component Value Date/Time   NA 141 12/27/2018 0401   NA 138 06/01/2017 0859   K 3.9 12/27/2018 0401   K 4.2 06/01/2017 0859   CL 82 (L) 12/27/2018  0401   CO2 46 (H) 12/27/2018 0350   CO2 24 06/01/2017 0859   GLUCOSE 186 (H) 12/27/2018 0401   GLUCOSE 118 06/01/2017 0859   BUN 25 (H) 12/27/2018 0401   BUN 15.2 06/01/2017 0859   CREATININE 0.70 12/27/2018 0401   CREATININE 0.79 06/02/2018 0856   CREATININE 0.7 06/01/2017 0859   CALCIUM 9.2 12/27/2018 0350   CALCIUM 9.0 06/01/2017 0859   PROT 6.3 (L) 12/27/2018 0351   PROT 7.0 06/01/2017 0859   ALBUMIN 1.2 (L) 12/27/2018 0351   ALBUMIN 3.7 06/01/2017 0859   AST 28 12/27/2018 0351   AST 13 (L) 06/02/2018 0856   AST 14 06/01/2017 0859   ALT 20 12/27/2018 0351   ALT 16 06/02/2018 0856   ALT 12 06/01/2017 0859   ALKPHOS 268 (H) 12/27/2018 0351   ALKPHOS 97 06/01/2017 0859   BILITOT 0.4 12/27/2018 0351   BILITOT 0.3 06/02/2018 0856   BILITOT 0.22 06/01/2017 0859   GFRNONAA >60 12/27/2018 0350   GFRNONAA >60 06/02/2018 0856   GFRAA >60 12/27/2018 0350  GFRAA >60 06/02/2018 0856   Lipase     Component Value Date/Time   LIPASE 24 11/30/2018 0355       Studies/Results: No results found.  Anti-infectives: Anti-infectives (From admission, onward)   Start     Dose/Rate Route Frequency Ordered Stop   11/30/2018 1030  clindamycin (CLEOCIN) 900 mg, gentamicin (GARAMYCIN) 240 mg in sodium chloride 0.9 % 1,000 mL for intraperitoneal lavage      Irrigation To Surgery 12/02/2018 1026 11/24/2018 1042   12/11/18 1500  anidulafungin (ERAXIS) 100 mg in sodium chloride 0.9 % 100 mL IVPB  Status:  Discontinued     100 mg 78 mL/hr over 100 Minutes Intravenous Every 24 hours 11/24/2018 1007 12/30/2018 1103   12/11/18 0600  piperacillin-tazobactam (ZOSYN) IVPB 3.375 g  Status:  Discontinued     3.375 g 100 mL/hr over 30 Minutes Intravenous Every 6 hours 12/11/18 0317 12/24/18 0737   12/02/2018 1800  piperacillin-tazobactam (ZOSYN) IVPB 2.25 g  Status:  Discontinued     2.25 g 100 mL/hr over 30 Minutes Intravenous Every 6 hours 12/12/2018 1413 12/11/18 0317   11/15/2018 1100  anidulafungin (ERAXIS)  200 mg in sodium chloride 0.9 % 200 mL IVPB     200 mg 78 mL/hr over 200 Minutes Intravenous  Once 12/02/2018 1007 11/30/2018 1456   12/03/2018 1100  piperacillin-tazobactam (ZOSYN) IVPB 3.375 g     3.375 g 100 mL/hr over 30 Minutes Intravenous  Once 11/22/2018 1027 12/09/2018 1245   12/08/18 1800  vancomycin (VANCOCIN) IVPB 750 mg/150 ml premix  Status:  Discontinued     750 mg 150 mL/hr over 60 Minutes Intravenous Every 48 hours 12/07/18 0822 12/07/18 0903   12/08/18 1000  ceFEPIme (MAXIPIME) 2 g in sodium chloride 0.9 % 100 mL IVPB  Status:  Discontinued     2 g 200 mL/hr over 30 Minutes Intravenous Every 24 hours 12/07/18 0822 12/07/18 0903   12/07/18 2200  ceFEPIme (MAXIPIME) 2 g in sodium chloride 0.9 % 100 mL IVPB  Status:  Discontinued     2 g 200 mL/hr over 30 Minutes Intravenous Every 12 hours 12/07/18 0903 11/20/2018 1027   12/07/18 1800  vancomycin (VANCOCIN) IVPB 750 mg/150 ml premix  Status:  Discontinued     750 mg 150 mL/hr over 60 Minutes Intravenous Every 24 hours 12/07/18 0903 12/09/18 1040   12/06/18 1800  vancomycin (VANCOCIN) IVPB 750 mg/150 ml premix  Status:  Discontinued     750 mg 150 mL/hr over 60 Minutes Intravenous Every 24 hours 12/05/18 1652 12/07/18 0822   12/05/18 1800  ceFEPIme (MAXIPIME) 2 g in sodium chloride 0.9 % 100 mL IVPB  Status:  Discontinued     2 g 200 mL/hr over 30 Minutes Intravenous Every 12 hours 12/05/18 1644 12/07/18 0822   12/05/18 1700  vancomycin (VANCOCIN) 1,250 mg in sodium chloride 0.9 % 250 mL IVPB     1,250 mg 166.7 mL/hr over 90 Minutes Intravenous  Once 12/05/18 1652 12/05/18 2019   12/11/2018 1800  fluconazole (DIFLUCAN) IVPB 200 mg  Status:  Discontinued     200 mg 100 mL/hr over 60 Minutes Intravenous Every 24 hours 11/30/2018 0748 12/07/2018 0847   12/05/2018 1800  fluconazole (DIFLUCAN) IVPB 400 mg  Status:  Discontinued     400 mg 100 mL/hr over 120 Minutes Intravenous Every 24 hours 12/03/2018 0847 11/29/2018 0856   12/08/2018 1400   piperacillin-tazobactam (ZOSYN) IVPB 2.25 g  Status:  Discontinued     2.25 g  100 mL/hr over 30 Minutes Intravenous Every 6 hours 12/03/2018 0748 12/01/18 1104   11/30/2018 0600  cefoTEtan (CEFOTAN) 2 g in sodium chloride 0.9 % 100 mL IVPB     2 g 200 mL/hr over 30 Minutes Intravenous On call to O.R. 11/22/18 1300 12/11/2018 0624   11/22/18 1800  fluconazole (DIFLUCAN) IVPB 400 mg  Status:  Discontinued     400 mg 100 mL/hr over 120 Minutes Intravenous Every 24 hours 12/06/2018 1528 12/01/2018 0748   11/22/18 1800  vancomycin (VANCOCIN) 1,500 mg in sodium chloride 0.9 % 500 mL IVPB  Status:  Discontinued     1,500 mg 250 mL/hr over 120 Minutes Intravenous Every 24 hours 11/22/18 0358 11/22/18 0750   11/22/18 0115  vancomycin (VANCOCIN) 1,500 mg in sodium chloride 0.9 % 500 mL IVPB     1,500 mg 250 mL/hr over 120 Minutes Intravenous  Once 11/22/18 0101 11/22/18 0319   12/03/2018 1630  fluconazole (DIFLUCAN) IVPB 800 mg     800 mg 100 mL/hr over 240 Minutes Intravenous  Once 12/02/2018 1527 11/20/2018 2013   11/15/2018 1600  piperacillin-tazobactam (ZOSYN) IVPB 3.375 g  Status:  Discontinued     3.375 g 12.5 mL/hr over 240 Minutes Intravenous Every 8 hours 12/01/2018 1522 12/04/2018 0748   11/16/2018 0545  piperacillin-tazobactam (ZOSYN) IVPB 3.375 g     3.375 g 100 mL/hr over 30 Minutes Intravenous  Once 12/08/2018 0530 12/05/2018 0626   12/08/2018 0530  piperacillin-tazobactam (ZOSYN) IVPB 4.5 g  Status:  Discontinued     4.5 g 200 mL/hr over 30 Minutes Intravenous  Once 11/20/2018 0517 12/02/2018 0529       Assessment/Plan Perforated small bowel with closed-loop obstruction  1.Exploratory laparotomy small bowel resection, lysis of adhesions, 1.5 hours, placement of wound VAC 11/22/2018 Dr. Coralie Keens 2. Exploratory laparotomy, washout and placement of wound VAC 11/29/2018 Dr. Marcello Moores [place] 3. Exploratory laparotomy with anastomosis of small bowel and closure of abdominal wall 12/08/2018 Dr. Marcello Moores  [place] 4.Exploratory laparotomy, resection of previous anastomosis, wound VAC placement 11/18/2018 Dr. Autumn Messing 5. Reopening of recent laparotomy, small bowel resection, placement of wound VAC 12/11/2018, Dr. Dorris Fetch [place](FINDINGS:Pus throughout abdomen, though minimal succus. Bilious rind over intestines. Small hole on distal segment of small bowel with small amount of bile)  6. EXPLORATORY LAPAROTOMY,WASH OUT OF ABDOMEN APPLICATION OF WOUND VAC([place]ra) 01/12/2019 Dr. Ninfa Linden 7. Exploratory laparotomy with abdominal washout, Small bowel anastomosis 01/05/2019 Dr. Dema Severin Has small bowel anastomosisleak There are no further surgical options  Respiratory failure - on vent Acute renal failure - followed by Dr. Mickel Crow. CRRT stopped 7/13 as kidney function improved and patient making urine DM Severe malnutrition - Pre albumn - 9.3 on 12/29/2018 On TPN Anemia Hgb - 6.9 - 12/27/2018 Code status DNR  FEN: N.p.o./TPN ID: none currently DVT: SCDs, heparin held due to anemia Follow-up: To be determined POC: Mattier,George Brother [phone]  [phone]  Matier,Vickie    [phone]   Plan: Patient continues to decline. Will continue to discuss comfort care options with family. Appreciate palliative team assistance.    LOS: 37 days    Wellington Hampshire , [facility] 12/28/2018, 7:49 AM Pager: [phone]  Agree with above. No plans for change of care.  Alphonsa Overall, MD, [facility] Pager: [phone] Office phone:  [phone]

## 2018-12-28 NOTE — Progress Notes (Signed)
Pt temp 102.8 despite 650mg  tylenol PR and ice packs on pt.  Dr. Genevive Bi aware. Will continue to monitot pt.

## 2018-12-28 NOTE — TOC Progression Note (Signed)
Transition of Care [facility]) - Progression Note    Patient Details  Name: Carmen Stone MRN: [id number] Date of Birth: [birth year]  Transition of [facility]) CM/SW Contact  Jayleah Garbers, Marjie Skiff, RN Phone Number: 12/28/2018, 1:04 PM  Clinical Narrative:     [facility] staff continue to follow along and assist with DC planning as needed.  Readmission Risk Interventions Readmission Risk Prevention Plan 12/04/2018  Transportation Screening Complete  PCP or Specialist Appt within 3-5 Days Not Complete  Not Complete comments not ready for d/c  HRI or [place] Complete  Social Work Consult for [place] Planning/Counseling Complete  Palliative Care Screening Not Applicable  Medication Review [employer]) Complete  Some recent data might be hidden

## 2018-12-29 LAB — GLUCOSE, CAPILLARY
Glucose-Capillary: 107 mg/dL — ABNORMAL HIGH (ref 70–99)
Glucose-Capillary: 233 mg/dL — ABNORMAL HIGH (ref 70–99)

## 2018-12-29 MED ORDER — NOREPINEPHRINE 4 MG/250ML-% IV SOLN
0.0000 ug/min | INTRAVENOUS | Status: DC
  Administered 2018-12-29: 21:00:00 2 ug/min via INTRAVENOUS
  Filled 2018-12-29 (×2): qty 250

## 2018-12-29 MED ORDER — TRAVASOL 10 % IV SOLN
INTRAVENOUS | Status: AC
  Administered 2018-12-29: 18:00:00 via INTRAVENOUS
  Filled 2018-12-29: qty 480

## 2018-12-29 MED ORDER — ALBUMIN HUMAN 5 % IV SOLN
25.0000 g | Freq: Once | INTRAVENOUS | Status: AC
  Administered 2018-12-29: 25 g via INTRAVENOUS
  Filled 2018-12-29: qty 500

## 2018-12-29 NOTE — Progress Notes (Signed)
Daily Progress Note   Patient Name: Carmen [facility]       Date: 12/29/2018 DOB: 1955-07-31  Age: 63 y.o. MRN#: [id number] Attending Physician: Nolon Nations, MD Primary Care Physician: Jettie Booze, NP Admit Date: 11/19/2018  Reason for Consultation/Follow-up: Establishing goals of care  Subjective: [name] is resting in bed, she appears chronically ill, doesn't arouse or engage.   Attempted to call but did not reach brother today.  Length of Stay: 38  Current Medications: Scheduled Meds:  . chlorhexidine gluconate (MEDLINE KIT)  15 mL Mouth Rinse BID  . Chlorhexidine Gluconate Cloth  6 each Topical Daily  . dorzolamide-timolol  1 drop Both Eyes BID  . mouth rinse  15 mL Mouth Rinse 10 times per day  . pantoprazole (PROTONIX) IV  40 mg Intravenous Q24H    Continuous Infusions: . sodium chloride Stopped (12/25/18 0957)  . fentaNYL infusion INTRAVENOUS 125 mcg/hr (12/29/18 1200)  . TPN ADULT (ION) 40 mL/hr at 12/29/18 1200  . TPN ADULT (ION)      PRN Meds: sodium chloride, acetaminophen, albuterol, fentaNYL, fentaNYL, hydrALAZINE, metoprolol tartrate, midazolam, sodium chloride flush  Physical Exam         General: Intubated.  Heart: Regular rate and rhythm.  Lungs: Good air movement on vent. Ext: No significant edema Skin: Warm and dry  Vital Signs: BP (!) 85/43   Pulse (!) 108   Temp 99.6 F (37.6 C) (Axillary)   Resp 18   Ht '5\' 4"'  (1.626 m)   Wt 65.1 kg   SpO2 98%   BMI 24.64 kg/m  SpO2: SpO2: 98 % O2 Device: O2 Device: Ventilator O2 Flow Rate: O2 Flow Rate (L/min): 4 L/min  Intake/output summary:   Intake/Output Summary (Last 24 hours) at 12/29/2018 1429 Last data filed at 12/29/2018 1200 Gross per 24 hour  Intake 1168.7 ml  Output 1900 ml  Net  -731.3 ml   LBM: Last BM Date: 12/28/18 Baseline Weight: Weight: 74.8 kg Most recent weight: Weight: 65.1 kg       Palliative Assessment/Data:    Flowsheet Rows     Most Recent Value  Intake Tab  Referral Department  Surgery  Unit at Time of Referral  ICU  Palliative Care Primary Diagnosis  Sepsis/Infectious Disease  Date Notified  12/08/2018  Palliative Care Type  New Palliative care  Reason for referral  Clarify Goals of Care  Date of Admission  12/11/2018  Date first seen by Palliative Care  12/14/18  # of days Palliative referral response time  1 Day(s)  # of days IP prior to Palliative referral  22  Clinical Assessment  Palliative Performance Scale Score  10%  Psychosocial & Spiritual Assessment  Palliative Care Outcomes  Palliative Care Outcomes  Clarified goals of care      Patient Active Problem List   Diagnosis Date Noted  . Dyspnea   . Palliative care by specialist   . Goals of care, counseling/discussion   . Acute respiratory failure with hypoxia and hypercapnia ([place]) 12/12/2018  . Toxic metabolic encephalopathy 97/67/3419  . Thrombocytopenia, acquired ([place]) 12/12/2018  . Small bowel perforation ([place])   . Pressure injury of skin 11/29/2018  . Encounter for central line placement   . Septic shock ([place])   . AKI (acute kidney injury) ([place])   . S/P dialysis catheter insertion ([place])   . 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 Perforated viscus 11/20/2018  . Port-A-Cath in place 08/03/2017  . Port catheter in place 01/31/2016  . Hypersensitivity reaction 02/08/2014  . Drug induced neutropenia(288.03) 01/30/2014  . Nausea with vomiting 11/04/2013  . 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 ATN (acute tubular necrosis) 11/02/2013  . Acute respiratory failure ([place]) 11/02/2013  . Acute respiratory failure with hypoxia ([place]) 10/25/2013  . Abnormal urinalysis 10/25/2013  . Postoperative anemia due to acute blood  loss 10/24/2013  . Breast abscess of female 10/22/2013  . HYPERLIPIDEMIA 07/16/2010  . ATELECTASIS 07/16/2010  . Type 2 diabetes mellitus ([place]) 06/24/2010  . 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 HYPOXEMIA 06/24/2010  . SMALL BOWEL OBSTRUCTION, HX OF 06/24/2010    Palliative Care Assessment & Plan   Patient Profile: 63 y.o. female  with past medical history of  admitted on 11/17/2018 with abdominal pain from perforation and complicated clinical course with multiple surgeries requiring resection of large portion of small bowel, code blue, and multisystem organ failure (EF 20% and vent dependent).  CRRT stopped with no plan to restart.     Assessment: Patient Active Problem List   Diagnosis Date Noted  . Dyspnea   . Palliative care by specialist   . Goals of care, counseling/discussion   . Acute respiratory failure with hypoxia and hypercapnia ([place]) 12/12/2018  . Toxic metabolic encephalopathy 37/90/2409  . Thrombocytopenia, acquired ([place]) 12/12/2018  . Small bowel perforation ([place])   . Pressure injury of skin 11/29/2018  . Encounter for central line placement   . Septic shock ([place])   . AKI (acute kidney injury) ([place])   . S/P dialysis catheter insertion ([place])   . 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 Perforated viscus 12/06/2018  . Port-A-Cath in place 08/03/2017  . Port catheter in place 01/31/2016  . Hypersensitivity reaction 02/08/2014  . Drug induced neutropenia(288.03) 01/30/2014  . Nausea with vomiting 11/04/2013  . 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 ATN (acute tubular necrosis) 11/02/2013  . Acute respiratory failure ([place]) 11/02/2013  . Acute respiratory failure with hypoxia ([place]) 10/25/2013  . Abnormal urinalysis 10/25/2013  . Postoperative anemia due to acute blood loss 10/24/2013  . Breast abscess of female 10/22/2013  . HYPERLIPIDEMIA 07/16/2010  . ATELECTASIS 07/16/2010  . Type 2 diabetes mellitus ([place])  06/24/2010  . 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 HYPOXEMIA 06/24/2010  . SMALL BOWEL OBSTRUCTION,  HX OF 06/24/2010    Recommendations/Plan: - DNR - Multiple discussions have been held with family by multiple providers over the past 2 weeks that [name] is approaching end of life regardless of interventions moving forward. - I did not reach her brother today, but on 7/15 he noted he will continue to discuss with family about potential transition to comfort care, but he wishes to continue with current interventions.  Agree with surgery team and PCCM that further procedures or escalation of care will not be beneficial and is not indicated.  Continue current care unless family agrees to transition to comfort.  I believe that she will die on the ventilator in the near future regardless of interventions moving forward.  Discussed with PCCM.  Code Status: DNR  Prognosis:   Poor  Discharge Planning:  Anticipated hospital death.   Care plan was discussed with PCCM.    Thank you for allowing the Palliative Medicine Team to assist in the care of this patient.   Time In: 1400 Time Out: 1425 Total Time 25 Prolonged Time Billed No      Greater than 50%  of this time was spent counseling and coordinating care related to the above assessment and plan.  Micheline Rough, MD [place] Team [phone]  Please contact Palliative Medicine Team phone at [phone] for questions and concerns.

## 2018-12-29 NOTE — Progress Notes (Signed)
Nutrition Follow-up  DOCUMENTATION CODES:   Not applicable  INTERVENTION:  - continue TPN per Pharmacy and Surgery discretion. - will continue to monitor for changes in medical care.   NUTRITION DIAGNOSIS:   Inadequate oral intake related to inability to eat as evidenced by NPO status. -ongoing  GOAL:   Patient will meet greater than or equal to 90% of their needs -unable to meet with current TPN regimen  MONITOR:   Vent status, Skin, I & O's, Other (Comment)(TPN regimen)  ASSESSMENT:   63 year old female with past medical history of type 2 DM, hyperlipidemia, HTN, inflammatory R breast cancer s/p radiation in 2015. Patient presented to the ED on 6/7 with severe abdominal pain, N/V. CT abdomen/pelvis which showed perforated small bowel with large amount of free air and multiple incarcerated incisional hernias.   Significant Events: 6/7- admission; ex lap, small bowel resection, LOAs, and placement of wound vac; NGT placement 6/9- CRRT initiation; return to OR for re-opening of open abdomen with washout 6/12- CRRT stopped 6/14- bowel anastomosis and closure of abdominal wall; CRRT re-started 6/16- initiation of TPN 6/19- extubation 6/20- re-intubation for AMS 6/21- CRRT stopped for break 6/22- initiation of trickle TF 6/23- CRRT re-started 6/24- begin slowly advancing TF to goal rate 6/25- TF stopped and NGT to LIS d/t abdominal distention 6/26- CT abdomen/pelvis (+) for free air; return to OR for ex lap, resection of previous anastomosis, application of wound vac 6/29- return to OR (possible abd closure today) 6/30- Palliative Care consulted 7/3- return to OR for ex lap, wash out of abdomen, placement of wound vac (open abd) 7/6- return to OR for washout, possible bowel anastomosis, and possible ostomy 7/13- now making urine; CRRT stopped 7/14- TPN decreased from goal rate of 75 ml/hr to 40 ml/hr   Patient remains intubated with NGT to LIS with scant amount of brown  output this AM. Patient has been off CRRT x2 days and no plan for re-start. Able to talk with RN. Able to talk with Pharmacist who reports she discussed with Surgery about TPN and Surgery wants to continue. Pharmacist opted to decrease rate d/t other medical care being de-escalated, d/c of labs and CBGs.   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  Palliative Care met with family yesterday afternoon and brother indicated that he is appreciative of information but would like to continue current interventions. Patient is DNR. Brother reported that he will continue to discuss with family about possible transition to comfort care. Ethics Committee is aware of patient. Palliative note from yesterday states "anticipated hospital death."    Patient is currently intubated on ventilator support MV: 7.4 L/min Temp (24hrs), Avg:100.2 F (37.9 C), Min:98.9 F (37.2 C), Max:101.4 F (38.6 C) Propofol: none BP: 109/50 and MAP: 68  Medications reviewed. Labs reviewed; CBGs: 107 and 233 mg/dl earlier this AM; 5/14-- K: 5.6 mmol/l, Cl: 91 mmol/l, BUN: 62 mg/dl, creatinine: 1.84 mg/dl, Ca: 8.2 mg/dl, GFR: 33 ml/min. Drip; fentanyl @ 125 mcg/hr.      Diet Order:   Diet Order    None      EDUCATION NEEDS:   No education needs have been identified at this time  Skin:  Skin Assessment: Skin Integrity Issues: Skin Integrity Issues:: DTI, Stage II, Incisions DTI: sacrum Stage II: L ear Wound Vac: abdomen Incisions: abdomen (7/6)  Last BM:  7/15 (50 ml via ileostomy)  Height:   Ht Readings from Last 1 Encounters:  12/26/18 _0  (1.626 m)    Weight:   Wt Readings from Last 1 Encounters:  12/27/18 65.1 kg    Ideal Body Weight:  54.5 kg  BMI:  Body mass index is 24.64 kg/m.  Estimated Nutritional Needs:   Kcal:  1423 kcal  Protein:  130-143 grams  Fluid:  >/= 1.8 L/day     Jarome Matin,  MS, RD, LDN, [facility] Inpatient Clinical Dietitian Pager # [phone] After hours/weekend pager # [phone]

## 2018-12-29 NOTE — Progress Notes (Addendum)
[facility] Progress Note  9 Days Post-Op  Subjective: CC-  Family discussion with palliative team yesterday noted. Patient's brother wishes to continue with current interventions. Ethics is aware of case. Patient remains on the vent. Tachycardic, hypotensive, and febrile.  Objective: Vital signs in last 24 hours: Temp:  [99.3 F (37.4 C)-101.4 F (38.6 C)] 99.7 F (37.6 C) (07/15 0348) Pulse Rate:  [103-124] 113 (07/15 0700) Resp:  [18-19] 18 (07/15 0700) BP: (80-113)/(41-63) 94/47 (07/15 0700) SpO2:  [97 %-100 %] 100 % (07/15 0700) FiO2 (%):  [30 %] 30 % (07/15 0348) Last BM Date: 12/28/18  Intake/Output from previous day: 07/14 0701 - 07/15 0700 In: 1175.3 [I.V.:1175.2; IV Piggyback:0.1] Out: 1750 [Urine:1200; Emesis/NG output:350; Stool:200] Intake/Output this shift: No intake/output data recorded.  PE: Gen:On the vent, wincing at times Pulm:coarse breath sounds bilaterally, mechanically ventilated Cardio: tachy Abd: Soft,Eakins pouch to suction with good seal/ feculent drainage Ext:1+ edema BLE/BUE Skin: warm and dry  Neuro: not following commands  Lab Results:  Recent Labs    12/27/18 0351 12/27/18 0354 12/27/18 0401  WBC 26.8*  --   --   HGB 6.9* 7.5* 6.8*  HCT 22.8* 22.0* 20.0*  PLT 298  --   --    BMET Recent Labs    12/27/18 0350  12/27/18 0401 12/28/18 0924  NA 145   < > 141 144  K 4.0   < > 3.9 5.6*  CL 84*   < > 82* 91*  CO2 46*  --   --  40*  GLUCOSE 195*   < > 186* 146*  BUN 30*   < > 25* 62*  CREATININE 0.79   < > 0.70 1.84*  CALCIUM 9.2  --   --  8.2*   < > = values in this interval not displayed.   PT/INR No results for input(s): LABPROT, INR in the last 72 hours. CMP     Component Value Date/Time   NA 144 12/28/2018 0924   NA 138 06/01/2017 0859   K 5.6 (H) 12/28/2018 0924   K 4.2 06/01/2017 0859   CL 91 (L) 12/28/2018 0924   CO2 40 (H) 12/28/2018 0924   CO2 24 06/01/2017 0859   GLUCOSE 146 (H)  12/28/2018 0924   GLUCOSE 118 06/01/2017 0859   BUN 62 (H) 12/28/2018 0924   BUN 15.2 06/01/2017 0859   CREATININE 1.84 (H) 12/28/2018 0924   CREATININE 0.79 06/02/2018 0856   CREATININE 0.7 06/01/2017 0859   CALCIUM 8.2 (L) 12/28/2018 0924   CALCIUM 9.0 06/01/2017 0859   PROT 6.3 (L) 12/27/2018 0351   PROT 7.0 06/01/2017 0859   ALBUMIN 1.2 (L) 12/27/2018 0351   ALBUMIN 3.7 06/01/2017 0859   AST 28 12/27/2018 0351   AST 13 (L) 06/02/2018 0856   AST 14 06/01/2017 0859   ALT 20 12/27/2018 0351   ALT 16 06/02/2018 0856   ALT 12 06/01/2017 0859   ALKPHOS 268 (H) 12/27/2018 0351   ALKPHOS 97 06/01/2017 0859   BILITOT 0.4 12/27/2018 0351   BILITOT 0.3 06/02/2018 0856   BILITOT 0.22 06/01/2017 0859   GFRNONAA 28 (L) 12/28/2018 0924   GFRNONAA >60 06/02/2018 0856   GFRAA 33 (L) 12/28/2018 0924   GFRAA >60 06/02/2018 0856   Lipase     Component Value Date/Time   LIPASE 24 12/14/2018 0355       Studies/Results: No results found.  Anti-infectives: Anti-infectives (From admission, onward)   Start     Dose/Rate  Route Frequency Ordered Stop   12/12/2018 1030  clindamycin (CLEOCIN) 900 mg, gentamicin (GARAMYCIN) 240 mg in sodium chloride 0.9 % 1,000 mL for intraperitoneal lavage      Irrigation To Surgery 11/26/2018 1026 11/20/2018 1042   12/11/18 1500  anidulafungin (ERAXIS) 100 mg in sodium chloride 0.9 % 100 mL IVPB  Status:  Discontinued     100 mg 78 mL/hr over 100 Minutes Intravenous Every 24 hours 11/26/2018 1007 01/06/2019 1103   12/11/18 0600  piperacillin-tazobactam (ZOSYN) IVPB 3.375 g  Status:  Discontinued     3.375 g 100 mL/hr over 30 Minutes Intravenous Every 6 hours 12/11/18 0317 12/24/18 0737   12/12/2018 1800  piperacillin-tazobactam (ZOSYN) IVPB 2.25 g  Status:  Discontinued     2.25 g 100 mL/hr over 30 Minutes Intravenous Every 6 hours 11/26/2018 1413 12/11/18 0317   12/01/2018 1100  anidulafungin (ERAXIS) 200 mg in sodium chloride 0.9 % 200 mL IVPB     200 mg 78 mL/hr  over 200 Minutes Intravenous  Once 12/03/2018 1007 11/22/2018 1456   11/26/2018 1100  piperacillin-tazobactam (ZOSYN) IVPB 3.375 g     3.375 g 100 mL/hr over 30 Minutes Intravenous  Once 11/20/2018 1027 12/05/2018 1245   12/08/18 1800  vancomycin (VANCOCIN) IVPB 750 mg/150 ml premix  Status:  Discontinued     750 mg 150 mL/hr over 60 Minutes Intravenous Every 48 hours 12/07/18 0822 12/07/18 0903   12/08/18 1000  ceFEPIme (MAXIPIME) 2 g in sodium chloride 0.9 % 100 mL IVPB  Status:  Discontinued     2 g 200 mL/hr over 30 Minutes Intravenous Every 24 hours 12/07/18 0822 12/07/18 0903   12/07/18 2200  ceFEPIme (MAXIPIME) 2 g in sodium chloride 0.9 % 100 mL IVPB  Status:  Discontinued     2 g 200 mL/hr over 30 Minutes Intravenous Every 12 hours 12/07/18 0903 11/20/2018 1027   12/07/18 1800  vancomycin (VANCOCIN) IVPB 750 mg/150 ml premix  Status:  Discontinued     750 mg 150 mL/hr over 60 Minutes Intravenous Every 24 hours 12/07/18 0903 12/09/18 1040   12/06/18 1800  vancomycin (VANCOCIN) IVPB 750 mg/150 ml premix  Status:  Discontinued     750 mg 150 mL/hr over 60 Minutes Intravenous Every 24 hours 12/05/18 1652 12/07/18 0822   12/05/18 1800  ceFEPIme (MAXIPIME) 2 g in sodium chloride 0.9 % 100 mL IVPB  Status:  Discontinued     2 g 200 mL/hr over 30 Minutes Intravenous Every 12 hours 12/05/18 1644 12/07/18 0822   12/05/18 1700  vancomycin (VANCOCIN) 1,250 mg in sodium chloride 0.9 % 250 mL IVPB     1,250 mg 166.7 mL/hr over 90 Minutes Intravenous  Once 12/05/18 1652 12/05/18 2019   11/22/2018 1800  fluconazole (DIFLUCAN) IVPB 200 mg  Status:  Discontinued     200 mg 100 mL/hr over 60 Minutes Intravenous Every 24 hours 11/22/2018 0748 11/15/2018 0847   11/22/2018 1800  fluconazole (DIFLUCAN) IVPB 400 mg  Status:  Discontinued     400 mg 100 mL/hr over 120 Minutes Intravenous Every 24 hours 12/01/2018 0847 11/29/2018 0856   11/20/2018 1400  piperacillin-tazobactam (ZOSYN) IVPB 2.25 g  Status:  Discontinued      2.25 g 100 mL/hr over 30 Minutes Intravenous Every 6 hours 11/26/2018 0748 12/01/18 1104   11/20/2018 0600  cefoTEtan (CEFOTAN) 2 g in sodium chloride 0.9 % 100 mL IVPB     2 g 200 mL/hr over 30 Minutes Intravenous On call  to O.R. 11/22/18 1300 12/01/2018 0624   11/22/18 1800  fluconazole (DIFLUCAN) IVPB 400 mg  Status:  Discontinued     400 mg 100 mL/hr over 120 Minutes Intravenous Every 24 hours 11/22/2018 1528 11/24/2018 0748   11/22/18 1800  vancomycin (VANCOCIN) 1,500 mg in sodium chloride 0.9 % 500 mL IVPB  Status:  Discontinued     1,500 mg 250 mL/hr over 120 Minutes Intravenous Every 24 hours 11/22/18 0358 11/22/18 0750   11/22/18 0115  vancomycin (VANCOCIN) 1,500 mg in sodium chloride 0.9 % 500 mL IVPB     1,500 mg 250 mL/hr over 120 Minutes Intravenous  Once 11/22/18 0101 11/22/18 0319   12/09/2018 1630  fluconazole (DIFLUCAN) IVPB 800 mg     800 mg 100 mL/hr over 240 Minutes Intravenous  Once 12/06/2018 1527 12/04/2018 2013   12/01/2018 1600  piperacillin-tazobactam (ZOSYN) IVPB 3.375 g  Status:  Discontinued     3.375 g 12.5 mL/hr over 240 Minutes Intravenous Every 8 hours 11/29/2018 1522 11/26/2018 0748   12/02/2018 0545  piperacillin-tazobactam (ZOSYN) IVPB 3.375 g     3.375 g 100 mL/hr over 30 Minutes Intravenous  Once 11/26/2018 0530 12/12/2018 0626   11/30/2018 0530  piperacillin-tazobactam (ZOSYN) IVPB 4.5 g  Status:  Discontinued     4.5 g 200 mL/hr over 30 Minutes Intravenous  Once 12/09/2018 0517 12/08/2018 0529       Assessment/Plan Perforatedsmall bowel with closed-loop obstruction  1.Exploratory laparotomy small bowel resection, lysis of adhesions, 1.5 hours, placement of wound VAC 12/12/2018 Dr. Coralie Keens 2. Exploratory laparotomy, washout and placement of wound VAC 11/24/2018 Dr. Marcello Moores Cornett 3. Exploratory laparotomy with anastomosis of small bowel and closure of abdominal wall 12/09/2018 Dr. Marcello Moores [place] 4.Exploratory laparotomy, resection of previous anastomosis, wound VAC  placement 12/03/2018 Dr. Autumn Messing 5. Reopening of recent laparotomy, small bowel resection, placement of wound VAC 12/11/2018, Dr. Dorris Fetch Byerly(FINDINGS:Pus throughout abdomen, though minimal succus. Bilious rind over intestines. Small hole on distal segment of small bowel with small amount of bile)  6.EXPLORATORY LAPAROTOMY,WASH OUT OF ABDOMEN APPLICATION OF WOUND TJQ(ZESPQZR)0/0/7622 Dr. Ninfa Linden 7.Exploratory laparotomy with abdominal washout,Small bowel anastomosis07/21/2020 Dr. Dema Severin Has small bowel anastomosisleak There are no further surgical options  Respiratory failure - on vent Acute renal failure - followed by Dr. Mickel Crow. CRRT stopped 7/13 as kidney function improved and patient making urine DM Severe malnutrition - Pre albumn - 9.3 on 01/13/2019 On TPN Anemia Hgb -6.9- 12/27/2018 Code status DNR  FEN: N.p.o./TPN QJ:FHLK currently TGY:BWLS, heparin held due to anemia Follow-up: To be determined POC: Mattier,George Brother [phone]  [phone]  Matier,Vickie    [phone]   Plan: Currently no plans for change of current interventions. Appreciate palliative team assistance.    LOS: 55 days    Wellington Hampshire , [facility] 12/29/2018, 7:48 AM Pager: [phone]  Agree with above.  Alphonsa Overall, MD, [facility] Pager: [phone] Office phone:  [phone]

## 2018-12-29 NOTE — Progress Notes (Addendum)
NAME:  Carmen Stone, MRN:  [id number], DOB:  12-27-1955, LOS: 51 ADMISSION DATE:  11/24/2018, CONSULTATION DATE:  11/21/2018 REFERRING MD:  Dr. Ninfa Linden, Surgery, CHIEF COMPLAINT:  Abdominal pain   Brief History   63 y/o female presented to ER 11/22/2018 with nausea, vomiting, abdominal pain.  CT abd/pelvis showed focal perforated loop of SB with free air with multiple complex ventral hernias.  Underwent emergent laparotomy with SB resection, lysis of adhesions and wound vac.  Remained on vent and pressors post op.  Hospital course complicated by respiratory arrest 6/13, multiple reintubations, intermittent vasopressor needs, AKI on CVVHD.     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, HTN, HLD, DM type II  Significant Hospital Events   6/07 Admit, to OR 6/08 transfuse FFP, on multiple pressors 6/09 To OR for wash out, start CRRT 6/13 Acute respiratory arrest overnight, off CRRT, on pressors, limit sedation 6/14 OR for bowel anastomosis and closure of abdominal wall 6/15 Changing volume goal on CRRT to -100 cc an hour. Weaned 7 hours PSV   6/16 Encephalopathic. Hypertensive, adding labetalol.  IV TNA started 6/18 Neuro status improved.  Required brief vasoactive drips.  Net negative fluid volume status.PSV 6/19 Extubated 6/20 Reintubated for altered mentation, head CT negative  6/23 Febrile overnight, Blood Cx.  6/25 L IJ, subclavian, axillary  + DVT. Heparin initiated. 6/26  CT ABD/pelvis positive for free air and ascites. Back to OR  for ex lap of perforated viscous, resection of previous anastomosis, application of wound vac. 1U RBCs intra op.   6/28 Back on pressors with hgb <7 / sedated on fent drip, PRBC 7/01 Less sedation, following commands, abd open with VAC, on CVVHD (even) 7/03 OR, unable to close but bowel "looks some better", hematoma in abd, on CVVHD 7/06 OR for laparotomy, washout, SB anastomosis  7/14 Febrile over night to 102.3,  Hypotensive 500 cc bolus and albumin, CVVHD d/c 7/15 no acute changes.  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    CT Chest 6/20 >> small bilateral pleural effusions with compressive atelectasis, anterior R 4-7 rib fractures CT ABD/Pelvis 6/20 >> small bowel surgical changes, mild circumferential wall thickening of scattered small bowel loops, small to moderate ascites LUE Duplex 6/24 >> DVT L IJ, subclavian, axillary, SVT L cephalic  CT ABD/Pelvis 8/00 >> large volume ascites with intraperitoneal free air   Micro Data:  COVID 6/07 >> negative Tracheal aspirate 6/20 >> E Coli sensitive to zosyn BCx2 6/23 >> negative BCx2  6/26 >> negative   Antimicrobials:  Zosyn 6/07 >> 6/17 Diflucan 6/07 >> 6/10 Cefepime 6/21 >>6/26 Vanco 6/21 >> 6/25 Anidulafungin 6/26 >> 7/06 Zosyn 6/26 >>7/10  Interim history/subjective:  Continues to appear critically ill and suffering. Patient grimaces with any movement. Fentanyl at 125 mcg and hypotensive   Objective   Blood pressure (Abnormal) 112/50, pulse (Abnormal) 118, temperature 98.9 F (37.2 C), temperature source Axillary, resp. rate 18, height 5\' 4"  (  1.626 m), weight 65.1 kg, SpO2 100 %. CVP:  [5 mmHg] 5 mmHg  Vent Mode: PRVC FiO2 (%):  [30 %] 30 % Set Rate:  [18 bmp] 18 bmp Vt Set:  [430 mL] 430 mL PEEP:  [5 cmH20] 5 cmH20 Plateau Pressure:  [20 cmH20-22 cmH20] 22 cmH20   Intake/Output Summary (Last 24 hours) at 12/29/2018 0834 Last data filed at 12/29/2018 0800  Gross per 24 hour  Intake 1175.29 ml  Output 1900 ml  Net -724.71 ml   Filed Weights   12/24/18 0500 12/25/18 0500 12/27/18 0500  Weight: 64.3 kg 65 kg 65.1 kg   Physical Exam:  General: Chronically ill-appearing 63 year old female, grimaces HEENT: /AT, no JVD, ETT in place, moist mucous membranes Pulmonary: Bil chest excursions, clear through, no accessory muscle use, not overbreathing the vent Cardiac: S1/S2, RRR, no murmurs, rubs, or gallops Abdomen: Wound vac intact with purulent drainage, BS hypoactive, tender, TPN infusing GU: Clea Neuro: grimaces w/ movement, does not open eyes to command. 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  in setting of prolonged critical illness, severe protein calorie malnutrition, metabolic encephalopathy and on-going peritonitis Not a candidate for trach Plan: - Continue current supportive measures - VAP bundle  - Full vent support - Appreciate palliative team assistance  Abd peritonitis 2/2 anastomotic leak S/p several ex-lap d/t anastomotic leaks Plan - Surgery following  - Continue current wound care as directed by surg  Acute renal failure  CRRT d/c 7/13 as kidney function improved Plan - No longer a candidate for CRRT  - Trend Cr and UOP -Last serum creatinine did rise from 0.7 up to 1.84, assuming this is stabilized I think we should discontinue dialysis catheter  Anemia of critical illness Plan - No longer a candidate for transfusions  Severe Protein Malnutrition Plan: - Will need to trend BMET, Mg, and Phos for continued TPN therapy - Pharm to manage TPN  Hypoglycemia Plan: - Hypoglycemia protocol in place - CBG q4h  Palliative care has had multiple discussions with family regarding the patient's poor prognosis. The brother indicated that he is appreciative of information but would like to continue with current interventions as a DNR, at  this time. Ethics is aware of the case.  There is really nothing else I can offer for this patient    Best practice:  Diet: TPN DVT prophylaxis: SCDs GI prophylaxis: Protonix Mobility: Bedrest Code Status: DNR Disposition: ICU   Erick Colace ACNP-BC [place] Pager # [phone] OR # [phone] if no answer

## 2018-12-29 NOTE — Progress Notes (Signed)
[facility] Progress Note Patient Name: Carmen Stone DOB: 1955-08-05 MRN: [id number]   Date of Service  12/29/2018  HPI/Events of Note  Pt with BP of 55/33, I spoke with POA Iona Beard who wants everything done short of CPR. Poor prognosis reiterated, family on their way back to the hospital.  eICU Interventions  Albumin 5 % 25 gm iv bolus ordered, Norepinephrine ordered for persistent hypotension following fluid bolus.        Kerry Kass Jaquasha [place] 12/29/2018, 8:29 PM

## 2018-12-29 NOTE — Progress Notes (Signed)
[facility] Progress Note Patient Name: Carmen Stone DOB: 1956-04-21 MRN: [id number]   Date of Service  12/29/2018  HPI/Events of Note  Pt hypotension much improved following Albumin + Norepinephrine. Family is in the room.  eICU Interventions  I updated family and answered all questions. I emphasized the poor prognosis.        Frederik Pear 12/29/2018, 9:48 PM

## 2018-12-29 NOTE — Progress Notes (Signed)
PHARMACY - ADULT TOTAL PARENTERAL NUTRITION CONSULT NOTE   Pharmacy Consult for TPN Indication: prolonged ileus  HPI: 60 yoF admitted 6/7 with perforated small bowel from closed-loop obstruction and underwent emergent exploratory laparotomy. Returned to OR on 6/9 and again on 6/14 for closure of abdomen.  Patient coded on 12/14/22 and developed multiorgan failure. Remains intubated & on CRRT. Pressors have been weaned off and she is completing antibiotic course for sepsis/PNA.  NPO since admission on 6/7. Pharmacy consulted to start TPN 6/16. D/t acute liver injury from shock, initiated TPN at low rate & advance slowly as requested by surgery.  Patient Measurements: Height: '5\' 4"'  (162.6 cm) Weight: 143 lb 8.3 oz (65.1 kg) IBW/kg (Calculated) : 54.7 TPN AdjBW (KG): 59.7 Body mass index is 24.64 kg/m. Usual Weight: 90kg  Significant events:  6/18: OK to increase to goal rate per surgery 6/22: CRRT stopped for holiday; CBG 69 overnight, D50 given but TPN continued (55 units insulin in TPN); BS and BM overnight - initiating trickle feeds 6/23: resuming CRRT, return to high-protein TPN; fevers overnight, CCM removing some central lines; will use chemo port for TPN 6/24: began adding lipids to TPN formula 6/25: trickle feeds stopped d/t abd distention 6/26: CRRT clotted off overnight; patient to OR this AM for ex lap; RN attempting to restart CRRT post-op but still having issues at this time 6/27: CRRT appears to have been successfully resumed overnight 6/29 OR pus throughout abdomen. Small hole in small bowel 7/3 OR wash out of abd, replace wound VAC, abd remains open 7/6 sched for OR today for wash out abd, replace wound VAC 7/8 new leak at surgical site; patient is not a candidate for additional surgeries. To continue TPN pending [place] discussion 7/13 CRRT dc'd 7/14 septic overnight. Hypoglycemic. Wound draining what appears to be stool  Central access: 12/14/2018 TPN start date:  11/30/18  ASSESSMENT                                                                                                          Current Nutrition: NPO, TPN IVF: none  Today:   Glucose (goal 100-150) - Hx DM on 70/30 Novolog 20 units BID PTA.  7/14 Multiple episodes of hypoglycemia requiring D50 after CRRT stopped 7/13 - resolved 7/15  ICU resistant scale q6h  Electrolytes - no labs today, only Na in TPN, no other lytes in TPN  Renal - CRRT stopped 7/13,   LFTs - albumin remains low; alk phos elevated and trending up, others WNL (7/13)  TGs - 166 (7/13)  Prealbumin -down to 7.8 (7/13) despite full support: reflects inflammation and critical illness  NUTRITIONAL GOALS  RD recs: (6/29):  Kcal:1493 kcal, Protein:195-215 gram Fluid:>/= 1.8 L/day  PLAN                                                                                                                          At 1800 today:  TPN at 40 ml/hr with no electrolytes (x Na) and no insulin.  Provides ~ 48 gm protein and ~ 970 kcals  Electrolytes:  take all lytes out of TPN x Na: Cl:Ac = max Cl  TPN to contain standard MVI daily, trace elements MWF only due to national backorder.  IVF per MD- none  DC SSI & CBGs 7/15   insulin out of TPN 7/14  Labs DC'd by CCS 7/13  Eudelia Bunch, Pharm.D [phone] 12/29/2018 7:19 AM

## 2018-12-29 NOTE — Progress Notes (Signed)
This chaplain attempted a Pt. and family spiritual care visit.  At the time of the visit, the Pt.family was not present.  The chaplain talked to the Pt. RN-Haley. The chaplain appreciated the RN steady presence and care with the Pt.   This chaplain is available for F/U spiritual care as needed.

## 2018-12-30 LAB — CBC
HCT: 19.4 % — ABNORMAL LOW (ref 36.0–46.0)
Hemoglobin: 6 g/dL — CL (ref 12.0–15.0)
MCH: 30.9 pg (ref 26.0–34.0)
MCHC: 30.9 g/dL (ref 30.0–36.0)
MCV: 100 fL (ref 80.0–100.0)
Platelets: 329 10*3/uL (ref 150–400)
RBC: 1.94 MIL/uL — ABNORMAL LOW (ref 3.87–5.11)
RDW: 16.4 % — ABNORMAL HIGH (ref 11.5–15.5)
WBC: 20.7 10*3/uL — ABNORMAL HIGH (ref 4.0–10.5)
nRBC: 0.3 % — ABNORMAL HIGH (ref 0.0–0.2)

## 2018-12-30 LAB — PREPARE RBC (CROSSMATCH)

## 2018-12-30 LAB — COMPREHENSIVE METABOLIC PANEL
ALT: 23 U/L (ref 0–44)
AST: 24 U/L (ref 15–41)
Albumin: 2 g/dL — ABNORMAL LOW (ref 3.5–5.0)
Alkaline Phosphatase: 199 U/L — ABNORMAL HIGH (ref 38–126)
Anion gap: 14 (ref 5–15)
BUN: 84 mg/dL — ABNORMAL HIGH (ref 8–23)
CO2: 32 mmol/L (ref 22–32)
Calcium: 7.7 mg/dL — ABNORMAL LOW (ref 8.9–10.3)
Chloride: 97 mmol/L — ABNORMAL LOW (ref 98–111)
Creatinine, Ser: 3.33 mg/dL — ABNORMAL HIGH (ref 0.44–1.00)
GFR calc Af Amer: 16 mL/min — ABNORMAL LOW (ref >60)
GFR calc non Af Amer: 14 mL/min — ABNORMAL LOW (ref >60)
Glucose, Bld: 382 mg/dL — ABNORMAL HIGH (ref 70–99)
Potassium: 3 mmol/L — ABNORMAL LOW (ref 3.5–5.1)
Sodium: 143 mmol/L (ref 135–145)
Total Bilirubin: 0.4 mg/dL (ref 0.3–1.2)
Total Protein: 6.7 g/dL (ref 6.5–8.1)

## 2018-12-30 LAB — GLUCOSE, CAPILLARY: Glucose-Capillary: 28 mg/dL — CL (ref 70–99)

## 2018-12-30 MED ORDER — SODIUM CHLORIDE 0.9% IV SOLUTION
Freq: Once | INTRAVENOUS | Status: AC
  Administered 2018-12-30: 06:00:00 via INTRAVENOUS

## 2018-12-30 MED ORDER — POTASSIUM CHLORIDE 10 MEQ/50ML IV SOLN: 10.0000 meq | INTRAVENOUS | Status: DC

## 2018-12-30 MED ORDER — TRAVASOL 10 % IV SOLN
INTRAVENOUS | Status: AC
  Administered 2018-12-30: 18:00:00 via INTRAVENOUS
  Filled 2018-12-30: qty 480

## 2018-12-30 MED ORDER — POTASSIUM CHLORIDE 10 MEQ/50ML IV SOLN
10.0000 meq | INTRAVENOUS | Status: AC
  Administered 2018-12-30 (×2): 10 meq via INTRAVENOUS
  Filled 2018-12-30 (×3): qty 50

## 2018-12-30 NOTE — Progress Notes (Signed)
eLink Physician-Brief Progress Note Patient Name: Carmen Stone DOB: [birth year] MRN: [id number]   Date of Service  12/30/2018  HPI/Events of Note  Hypokalemia  eICU Interventions  Modified Elink electrolyte replacement protocol orders entered        Frederik Pear 12/30/2018, 5:08 AM

## 2018-12-30 NOTE — Progress Notes (Signed)
PHARMACY - ADULT TOTAL PARENTERAL NUTRITION CONSULT NOTE   Pharmacy Consult for TPN Indication: prolonged ileus  HPI: [name] admitted 6/7 with perforated small bowel from closed-loop obstruction and underwent emergent exploratory laparotomy. Returned to OR on 6/9 and again on 6/14 for closure of abdomen.  Patient coded on 6/13 and developed multiorgan failure. Remains intubated & on CRRT. Pressors have been weaned off and she is completing antibiotic course for sepsis/PNA.  NPO since admission on 6/7. Pharmacy consulted to start TPN 6/16. D/t acute liver injury from shock, initiated TPN at low rate & advance slowly as requested by surgery.  Patient Measurements: Height: 5' 4" (162.6 cm) Weight: 143 lb 8.3 oz (65.1 kg) IBW/kg (Calculated) : 54.7 TPN AdjBW (KG): 59.7 Body mass index is 24.64 kg/m. Usual Weight: 90kg  Significant events:  6/18: OK to increase to goal rate per surgery 6/22: CRRT stopped for holiday; CBG 69 overnight, D50 given but TPN continued (55 units insulin in TPN); BS and BM overnight - initiating trickle feeds 6/23: resuming CRRT, return to high-protein TPN; fevers overnight, CCM removing some central lines; will use chemo port for TPN 6/24: began adding lipids to TPN formula 6/25: trickle feeds stopped d/t abd distention 6/26: CRRT clotted off overnight; patient to OR this AM for ex lap; RN attempting to restart CRRT post-op but still having issues at this time 6/27: CRRT appears to have been successfully resumed overnight 6/29 OR pus throughout abdomen. Small hole in small bowel 7/3 OR wash out of abd, replace wound VAC, abd remains open 7/6 sched for OR today for wash out abd, replace wound VAC 7/8 new leak at surgical site; patient is not a candidate for additional surgeries. To continue TPN pending GOC discussion 7/13 CRRT dc'd 7/14 septic overnight. Hypoglycemic. Wound draining what appears to be stool  Central access: 11/16/2018 TPN start date:  11/30/18  ASSESSMENT                                                                                                          Current Nutrition: NPO, TPN IVF: none  Today:   Glucose (goal 100-150) - Hx DM on 70/30 Novolog 20 units BID PTA.  7/14 Multiple episodes of hypoglycemia requiring D50 after CRRT stopped 7/13 - resolved 7/15  Electrolytes - K 3> 2 runs given by CCM, only Na in TPN, no other lytes in TPN  Renal - CRRT stopped 7/13, SCr up to 3.33, making urine  LFTs - albumin remains low; alk phos elevated, others WNL (7/16)  TGs - 166 (7/13)  Prealbumin -down to 7.8 (7/13) despite full support: reflects inflammation and critical illness  NUTRITIONAL GOALS                                                                                               RD recs: (6/29):  Kcal:1493 kcal, Protein:195-215 gram Fluid:>/= 1.8 L/day  PLAN                                                                                                                          At 1800 today:  TPN at 40 ml/hr with no electrolytes (x Na) and no insulin.  Provides ~ 48 gm protein and ~ 970 kcals  Electrolytes: all lytes out of TPN x Na: Cl:Ac = max Cl  TPN to contain standard MVI daily  IVF per MD- none  DC SSI & CBGs 7/15  insulin out of TPN 7/14  Chrisha T. Bell, Pharm.D [phone] 12/30/2018 9:19 AM  

## 2018-12-30 NOTE — Progress Notes (Signed)
[facility] Progress Note Patient Name: Carmen Stone DOB: January 04, 1956 MRN: [id number]   Date of Service  12/30/2018  HPI/Events of Note  Acute blood loss anemia: Hemoglobin is 6 gm  eICU Interventions  Transfuse 2 units PRBC        Okoronkwo U Ogan 12/30/2018, 4:43 AM

## 2018-12-30 NOTE — Progress Notes (Signed)
[facility] Progress Note Patient Name: Carmen Stone DOB: May 25, 1956 MRN: [id number]   Date of Service  12/30/2018  HPI/Events of Note  Had an episode of narrow complex tachycardia earlier tonight, Now appears to be in sinus rhythm.  eICU Interventions  Will monitor electrolytes, and replace aggressively as indicated        [name] U Corney Knighton 12/30/2018, 2:04 AM

## 2018-12-30 NOTE — Progress Notes (Addendum)
Daily Progress Note   Patient Name: Carmen Stone       Date: 12/30/2018 DOB: [birth year]  Age: 63 y.o. MRN#: [id number] Attending Physician: Nolon Nations, MD Primary Care Physician: Jettie Booze, NP Admit Date: 12/08/2018  Reason for Consultation/Follow-up: Establishing goals of care  Subjective: Ms. Merriott is resting in bed, she appears chronically ill, doesn't arouse or engage.   Daughter is at the bedside.  We discussed her mother's clinical course and plan moving forward for continuation of current interventions without escalation of care (including no reinitiation of renal replacement therapy).  She expresses understanding care plan and gratefulness for the care her mother has received.    Length of Stay: 39  Current Medications: Scheduled Meds:  . chlorhexidine gluconate (MEDLINE KIT)  15 mL Mouth Rinse BID  . Chlorhexidine Gluconate Cloth  6 each Topical Daily  . dorzolamide-timolol  1 drop Both Eyes BID  . mouth rinse  15 mL Mouth Rinse 10 times per day  . pantoprazole (PROTONIX) IV  40 mg Intravenous Q24H    Continuous Infusions: . sodium chloride Stopped (12/25/18 0957)  . fentaNYL infusion INTRAVENOUS 125 mcg/hr (12/30/18 1400)  . norepinephrine (LEVOPHED) Adult infusion 2 mcg/min (12/30/18 1400)  . TPN ADULT (ION) 40 mL/hr at 12/30/18 1400  . TPN ADULT (ION)      PRN Meds: sodium chloride, acetaminophen, albuterol, fentaNYL, fentaNYL, hydrALAZINE, metoprolol tartrate, midazolam, sodium chloride flush  Physical Exam         General: Intubated.  Heart: Regular rate and rhythm.  Lungs: Good air movement on vent. Ext: No significant edema Skin: Warm and dry  Vital Signs: BP (!) 100/43   Pulse 92   Temp 98.8 F (37.1 C) (Axillary)   Resp 18   Ht _0  (1.626  m)   Wt 65.1 kg   SpO2 100%   BMI 24.64 kg/m  SpO2: SpO2: 100 % O2 Device: O2 Device: Ventilator O2 Flow Rate: O2 Flow Rate (L/min): 4 L/min  Intake/output summary:   Intake/Output Summary (Last 24 hours) at 12/30/2018 1534 Last data filed at 12/30/2018 1400 Gross per 24 hour  Intake 2337.53 ml  Output 1725 ml  Net 612.53 ml   LBM: Last BM Date: 12/29/18 Baseline Weight: Weight: 74.8 kg Most recent weight: Weight: 65.1 kg  Palliative Assessment/Data:    Flowsheet Rows     Most Recent Value  Intake Tab  Referral Department  Surgery  Unit at Time of Referral  ICU  Palliative Care Primary Diagnosis  Sepsis/Infectious Disease  Date Notified  12/14/2018  Palliative Care Type  New Palliative care  Reason for referral  Clarify Goals of Care  Date of Admission  11/15/2018  Date first seen by Palliative Care  12/14/18  # of days Palliative referral response time  1 Day(s)  # of days IP prior to Palliative referral  22  Clinical Assessment  Palliative Performance Scale Score  10%  Psychosocial & Spiritual Assessment  Palliative Care Outcomes  Palliative Care Outcomes  Clarified goals of care      Patient Active Problem List   Diagnosis Date Noted  . Dyspnea   . Palliative care by specialist   . Goals of care, counseling/discussion   . Acute respiratory failure with hypoxia and hypercapnia ([place]) 12/12/2018  . Toxic metabolic encephalopathy 76/73/4193  . Thrombocytopenia, acquired ([place]) 12/12/2018  . Small bowel perforation ([place])   . Pressure injury of skin 11/29/2018  . Encounter for central line placement   . Septic shock ([place])   . AKI (acute kidney injury) ([place])   . S/P dialysis catheter insertion ([place])   . 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 Perforated viscus 11/24/2018  . Port-A-Cath in place 08/03/2017  . Port catheter in place 01/31/2016  . Hypersensitivity reaction 02/08/2014  . Drug induced  neutropenia(288.03) 01/30/2014  . Nausea with vomiting 11/04/2013  . 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 ATN (acute tubular necrosis) 11/02/2013  . Acute respiratory failure ([place]) 11/02/2013  . Acute respiratory failure with hypoxia ([place]) 10/25/2013  . Abnormal urinalysis 10/25/2013  . Postoperative anemia due to acute blood loss 10/24/2013  . Breast abscess of female 10/22/2013  . HYPERLIPIDEMIA 07/16/2010  . ATELECTASIS 07/16/2010  . Type 2 diabetes mellitus ([place]) 06/24/2010  . 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 HYPOXEMIA 06/24/2010  . SMALL BOWEL OBSTRUCTION, HX OF 06/24/2010    Palliative Care Assessment & Plan   Patient Profile: 63 y.o. female  with past medical history of  admitted on 12/02/2018 with abdominal pain from perforation and complicated clinical course with multiple surgeries requiring resection of large portion of small bowel, code blue, and multisystem organ failure (EF 20% and vent dependent).  CRRT stopped with no plan to restart.     Assessment: Patient Active Problem List   Diagnosis Date Noted  . Dyspnea   . Palliative care by specialist   . Goals of care, counseling/discussion   . Acute respiratory failure with hypoxia and hypercapnia ([place]) 12/12/2018  . Toxic metabolic encephalopathy 79/07/4095  . Thrombocytopenia, acquired ([place]) 12/12/2018  . Small bowel perforation ([place])   . Pressure injury of skin 11/29/2018  . Encounter for central line placement   . Septic shock ([place])   . AKI (acute kidney injury) ([place])   . S/P dialysis catheter insertion ([place])   . 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 Perforated viscus 12/01/2018  . Port-A-Cath in place 08/03/2017  . Port catheter in place 01/31/2016  . Hypersensitivity reaction 02/08/2014  . Drug induced neutropenia(288.03) 01/30/2014  . Nausea with vomiting 11/04/2013  . 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 ATN (acute tubular necrosis)  11/02/2013  . Acute respiratory failure ([place]) 11/02/2013  . Acute respiratory failure with hypoxia ([place]) 10/25/2013  . Abnormal  urinalysis 10/25/2013  . Postoperative anemia due to acute blood loss 10/24/2013  . Breast abscess of female 10/22/2013  . HYPERLIPIDEMIA 07/16/2010  . ATELECTASIS 07/16/2010  . Type 2 diabetes mellitus ([place]) 06/24/2010  . 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 HYPOXEMIA 06/24/2010  . SMALL BOWEL OBSTRUCTION, HX OF 06/24/2010    Recommendations/Plan: - DNR - Multiple discussions have been held with family by multiple providers over the past 2 weeks that Ms. Imai is approaching end of life regardless of interventions moving forward.  Agree with surgery team and PCCM that further procedures or escalation of care will not be beneficial and is not indicated.  Continue current care unless family agrees to transition to comfort.  I believe that she will die on the ventilator in the near future regardless of interventions moving forward.  - I discussed this with her daughter today and she expressed understanding of care plan.  She expressed understanding that continued care will not result in her mother recovering, but it was apparent that continuation of current care - rather than shifting to comfort - will continue to allow family (particularly her 39) to feel as though they honored her wishes. - At this point I do think that further pursuit of futility policy is in the best interest of family as a whole.  I believe that she is likely to die regardless of interventions in the next few days and that pursuing futility policy will cause additional family trauma without significant change in her care or outcome at this point.    Code Status: DNR  Prognosis:   Poor  Discharge Planning:  Anticipated hospital death.   Care plan was discussed with PCCM.    Thank you for allowing the Palliative Medicine Team to assist in the care of this patient.   Time In: 1130  Time Out: 1220 Total Time 50 Prolonged Time Billed No      Greater than 50%  of this time was spent counseling and coordinating care related to the above assessment and plan.  Micheline Rough, MD [facility] Team [phone]  Please contact Palliative Medicine Team phone at [phone] for questions and concerns.

## 2018-12-30 NOTE — Progress Notes (Signed)
NAME:  Carmen Stone, MRN:  [id number], DOB:  [birth year], LOS: 49 ADMISSION DATE:  11/22/2018, CONSULTATION DATE:  11/21/2018 REFERRING MD:  Dr. Ninfa Linden, Surgery, CHIEF COMPLAINT:  Abdominal pain   Brief History   63 y/o female presented to ER 11/15/2018 with nausea, vomiting, abdominal pain.  CT abd/pelvis showed focal perforated loop of SB with free air with multiple complex ventral hernias.  Underwent emergent laparotomy with SB resection, lysis of adhesions and wound vac.  Remained on vent and pressors post op.  Hospital course complicated by respiratory arrest 6/13, multiple reintubations, intermittent vasopressor needs, AKI on CVVHD.     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, HTN, HLD, DM type II  Significant Hospital Events   6/07 Admit, to OR 6/08 transfuse FFP, on multiple pressors 6/09 To OR for wash out, start CRRT 6/13 Acute respiratory arrest overnight, off CRRT, on pressors, limit sedation 6/14 OR for bowel anastomosis and closure of abdominal wall 6/15 Changing volume goal on CRRT to -100 cc an hour. Weaned 7 hours PSV   6/16 Encephalopathic. Hypertensive, adding labetalol.  IV TNA started 6/18 Neuro status improved.  Required brief vasoactive drips.  Net negative fluid volume status.PSV 6/19 Extubated 6/20 Reintubated for altered mentation, head CT negative  6/23 Febrile overnight, Blood Cx.  6/25 L IJ, subclavian, axillary  + DVT. Heparin initiated. 6/26  CT ABD/pelvis positive for free air and ascites. Back to OR  for ex lap of perforated viscous, resection of previous anastomosis, application of wound vac. 1U RBCs intra op.   6/28 Back on pressors with hgb <7 / sedated on fent drip, PRBC 7/01 Less sedation, following commands, abd open with VAC, on CVVHD (even) 7/03 OR, unable to close but bowel "looks some better", hematoma in abd, on CVVHD 7/06 OR for laparotomy, washout, SB anastomosis  7/14 Febrile over night to 102.3,  Hypotensive 500 cc bolus and albumin, CVVHD d/c 7/15 no acute changes.  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    CT Chest 6/20 >> small bilateral pleural effusions with compressive atelectasis, anterior R 4-7 rib fractures CT ABD/Pelvis 6/20 >> small bowel surgical changes, mild circumferential wall thickening of scattered small bowel loops, small to moderate ascites LUE Duplex 6/24 >> DVT L IJ, subclavian, axillary, SVT L cephalic  CT ABD/Pelvis 4/48 >> large volume ascites with intraperitoneal free air   Micro Data:  COVID 6/07 >> negative Tracheal aspirate 6/20 >> E Coli sensitive to zosyn BCx2 6/23 >> negative BCx2  6/26 >> negative   Antimicrobials:  Zosyn 6/07 >> 6/17 Diflucan 6/07 >> 6/10 Cefepime 6/21 >>6/26 Vanco 6/21 >> 6/25 Anidulafungin 6/26 >> 7/06 Zosyn 6/26 >>7/10  Interim history/subjective:  Now on pressors.  Completing blood transfusion Objective   Blood pressure (Abnormal) 111/46, pulse 96, temperature 99.1 F (37.3 C), temperature source Oral, resp. rate 18, height 5\' 4"  (1.626 m), weight 65.1  kg, SpO2 100 %.    Vent Mode: PRVC FiO2 (%):  [30 %] 30 % Set Rate:  [18 bmp] 18 bmp Vt Set:  [430 mL] 430 mL PEEP:  [5 cmH20] 5 cmH20 Plateau Pressure:  [20 cmH20-25 cmH20] 25 cmH20   Intake/Output Summary (Last 24 hours) at 12/30/2018 0930 Last data filed at 12/30/2018 0815 Gross per 24 hour  Intake 1424.28 ml  Output 1725 ml    Net -300.72 ml   Filed Weights   12/24/18 0500 12/25/18 0500 12/27/18 0500  Weight: 64.3 kg 65 kg 65.1 kg   Physical Exam:  General: Chronically ill-appearing 63 year old female she still grimaces to pain she remains ventilator dependent and now on pressors  HEENT: Sclera nonicteric orally intubated no JVD mucous membranes moist Pulmonary: Diminished bases no accessory use Cardiac: Regular rate and rhythm GU: Concentrated yellow urine Neuro: Grimaces not following commands Abdomen: Wound VAC intact.  Feculent still draining from wound to drain  Resolved Hospital Problem list     Assessment & Plan:   Ventilator dependence w/ failure to wean  in setting of prolonged critical illness, severe protein calorie malnutrition, metabolic encephalopathy and on-going peritonitis -She is not a candidate for tracheostomy Plan: Continue full ventilator support VAP bundle PAD protocol  Abd peritonitis 2/2 anastomotic leak with recurrent septic shock -This is ongoing and not amendable to surgery  -Off antibiotics  -Still with significant leukocytosis -Ongoing fevers plan Continue wound care  Acute renal failure  -Serum creatinine climbing but continues to make urine. -No longer has dialysis access -No longer a dialysis candidate Plan Monitor urine output Limit lab work  Anemia of critical illness, hemoglobin has dropped down to 6 -Received 2 units of blood Plan We will discontinue further CBCs  Severe Protein Malnutrition Plan: Continue TPN  Hypoglycemia Plan: Glycemic protocol   Best practice:  Diet: TPN DVT prophylaxis: SCDs GI prophylaxis: PPI Mobility: Bedrest Code Status: DO NOT RESUSCITATE Disposition: Terminally ill.  In shock once again.  She will not survive this illness.  We will not escalate care further than what we have done   Erick Colace ACNP-BC [place] Pager # [phone] OR # [phone] if no answer

## 2018-12-30 NOTE — Progress Notes (Addendum)
[facility] Progress Note  10 Days Post-Op  Subjective: CC-  Started on pressors last night and getting blood transfusion this morning. Per RN palliative team plans to attempt one more family meeting, then may considering further involving ethics comity.   Objective: Vital signs in last 24 hours: Temp:  [97.9 F (36.6 C)-101.7 F (38.7 C)] 99.1 F (37.3 C) (07/16 0815) Pulse Rate:  [87-140] 96 (07/16 0815) Resp:  [14-22] 18 (07/16 0815) BP: (52-130)/(22-57) 111/46 (07/16 0815) SpO2:  [89 %-100 %] 100 % (07/16 0815) FiO2 (%):  [30 %] 30 % (07/16 0728) Last BM Date: 12/29/18  Intake/Output from previous day: 07/15 0701 - 07/16 0700 In: 1101.2 [I.V.:722.5; Blood:310; IV Piggyback:68.6] Out: 1875 [Urine:1075; Emesis/NG output:150; Drains:600; Stool:50] Intake/Output this shift: Total I/O In: 373.3 [Blood:373.3] Out: -   PE: Gen:On the vent Pulm:coarse breath soundsbilaterally, mechanically ventilated Cardio: tachy Abd: Soft,grimaces in pain to palpation, Eakins pouchto suction with good seal/feculent drainage Ext:1+ edema BLE/BUE Skin: warm and dry Neuro: not following commands  Lab Results:  Recent Labs    12/30/18 0406  WBC 20.7*  HGB 6.0*  HCT 19.4*  PLT 329   BMET Recent Labs    12/28/18 0924 12/30/18 0406  NA 144 143  K 5.6* 3.0*  CL 91* 97*  CO2 40* 32  GLUCOSE 146* 382*  BUN 62* 84*  CREATININE 1.84* 3.33*  CALCIUM 8.2* 7.7*   PT/INR No results for input(s): LABPROT, INR in the last 72 hours. CMP     Component Value Date/Time   NA 143 12/30/2018 0406   NA 138 06/01/2017 0859   K 3.0 (L) 12/30/2018 0406   K 4.2 06/01/2017 0859   CL 97 (L) 12/30/2018 0406   CO2 32 12/30/2018 0406   CO2 24 06/01/2017 0859   GLUCOSE 382 (H) 12/30/2018 0406   GLUCOSE 118 06/01/2017 0859   BUN 84 (H) 12/30/2018 0406   BUN 15.2 06/01/2017 0859   CREATININE 3.33 (H) 12/30/2018 0406   CREATININE 0.79 06/02/2018 0856   CREATININE 0.7  06/01/2017 0859   CALCIUM 7.7 (L) 12/30/2018 0406   CALCIUM 9.0 06/01/2017 0859   PROT 6.7 12/30/2018 0406   PROT 7.0 06/01/2017 0859   ALBUMIN 2.0 (L) 12/30/2018 0406   ALBUMIN 3.7 06/01/2017 0859   AST 24 12/30/2018 0406   AST 13 (L) 06/02/2018 0856   AST 14 06/01/2017 0859   ALT 23 12/30/2018 0406   ALT 16 06/02/2018 0856   ALT 12 06/01/2017 0859   ALKPHOS 199 (H) 12/30/2018 0406   ALKPHOS 97 06/01/2017 0859   BILITOT 0.4 12/30/2018 0406   BILITOT 0.3 06/02/2018 0856   BILITOT 0.22 06/01/2017 0859   GFRNONAA 14 (L) 12/30/2018 0406   GFRNONAA >60 06/02/2018 0856   GFRAA 16 (L) 12/30/2018 0406   GFRAA >60 06/02/2018 0856   Lipase     Component Value Date/Time   LIPASE 24 11/18/2018 0355       Studies/Results: No results found.  Anti-infectives: Anti-infectives (From admission, onward)   Start     Dose/Rate Route Frequency Ordered Stop   12/04/2018 1030  clindamycin (CLEOCIN) 900 mg, gentamicin (GARAMYCIN) 240 mg in sodium chloride 0.9 % 1,000 mL for intraperitoneal lavage      Irrigation To Surgery 12/11/2018 1026 12/09/2018 1042   12/11/18 1500  anidulafungin (ERAXIS) 100 mg in sodium chloride 0.9 % 100 mL IVPB  Status:  Discontinued     100 mg 78 mL/hr over 100 Minutes Intravenous Every  24 hours 12/12/2018 1007 12/26/2018 1103   12/11/18 0600  piperacillin-tazobactam (ZOSYN) IVPB 3.375 g  Status:  Discontinued     3.375 g 100 mL/hr over 30 Minutes Intravenous Every 6 hours 12/11/18 0317 12/24/18 0737   12/12/2018 1800  piperacillin-tazobactam (ZOSYN) IVPB 2.25 g  Status:  Discontinued     2.25 g 100 mL/hr over 30 Minutes Intravenous Every 6 hours 11/16/2018 1413 12/11/18 0317   11/28/2018 1100  anidulafungin (ERAXIS) 200 mg in sodium chloride 0.9 % 200 mL IVPB     200 mg 78 mL/hr over 200 Minutes Intravenous  Once 12/12/2018 1007 11/24/2018 1456   11/22/2018 1100  piperacillin-tazobactam (ZOSYN) IVPB 3.375 g     3.375 g 100 mL/hr over 30 Minutes Intravenous  Once 12/09/2018 1027  11/26/2018 1245   12/08/18 1800  vancomycin (VANCOCIN) IVPB 750 mg/150 ml premix  Status:  Discontinued     750 mg 150 mL/hr over 60 Minutes Intravenous Every 48 hours 12/07/18 0822 12/07/18 0903   12/08/18 1000  ceFEPIme (MAXIPIME) 2 g in sodium chloride 0.9 % 100 mL IVPB  Status:  Discontinued     2 g 200 mL/hr over 30 Minutes Intravenous Every 24 hours 12/07/18 0822 12/07/18 0903   12/07/18 2200  ceFEPIme (MAXIPIME) 2 g in sodium chloride 0.9 % 100 mL IVPB  Status:  Discontinued     2 g 200 mL/hr over 30 Minutes Intravenous Every 12 hours 12/07/18 0903 12/08/2018 1027   12/07/18 1800  vancomycin (VANCOCIN) IVPB 750 mg/150 ml premix  Status:  Discontinued     750 mg 150 mL/hr over 60 Minutes Intravenous Every 24 hours 12/07/18 0903 12/09/18 1040   12/06/18 1800  vancomycin (VANCOCIN) IVPB 750 mg/150 ml premix  Status:  Discontinued     750 mg 150 mL/hr over 60 Minutes Intravenous Every 24 hours 12/05/18 1652 12/07/18 0822   12/05/18 1800  ceFEPIme (MAXIPIME) 2 g in sodium chloride 0.9 % 100 mL IVPB  Status:  Discontinued     2 g 200 mL/hr over 30 Minutes Intravenous Every 12 hours 12/05/18 1644 12/07/18 0822   12/05/18 1700  vancomycin (VANCOCIN) 1,250 mg in sodium chloride 0.9 % 250 mL IVPB     1,250 mg 166.7 mL/hr over 90 Minutes Intravenous  Once 12/05/18 1652 12/05/18 2019   12/06/2018 1800  fluconazole (DIFLUCAN) IVPB 200 mg  Status:  Discontinued     200 mg 100 mL/hr over 60 Minutes Intravenous Every 24 hours 11/15/2018 0748 11/24/2018 0847   11/24/2018 1800  fluconazole (DIFLUCAN) IVPB 400 mg  Status:  Discontinued     400 mg 100 mL/hr over 120 Minutes Intravenous Every 24 hours 11/26/2018 0847 11/29/2018 0856   11/24/2018 1400  piperacillin-tazobactam (ZOSYN) IVPB 2.25 g  Status:  Discontinued     2.25 g 100 mL/hr over 30 Minutes Intravenous Every 6 hours 12/03/2018 0748 12/01/18 1104   12/14/2018 0600  cefoTEtan (CEFOTAN) 2 g in sodium chloride 0.9 % 100 mL IVPB     2 g 200 mL/hr over 30  Minutes Intravenous On call to O.R. 11/22/18 1300 11/24/2018 0624   11/22/18 1800  fluconazole (DIFLUCAN) IVPB 400 mg  Status:  Discontinued     400 mg 100 mL/hr over 120 Minutes Intravenous Every 24 hours 12/06/2018 1528 11/20/2018 0748   11/22/18 1800  vancomycin (VANCOCIN) 1,500 mg in sodium chloride 0.9 % 500 mL IVPB  Status:  Discontinued     1,500 mg 250 mL/hr over 120 Minutes Intravenous Every  24 hours 11/22/18 0358 11/22/18 0750   11/22/18 0115  vancomycin (VANCOCIN) 1,500 mg in sodium chloride 0.9 % 500 mL IVPB     1,500 mg 250 mL/hr over 120 Minutes Intravenous  Once 11/22/18 0101 11/22/18 0319   11/18/2018 1630  fluconazole (DIFLUCAN) IVPB 800 mg     800 mg 100 mL/hr over 240 Minutes Intravenous  Once 12/01/2018 1527 11/24/2018 2013   12/11/2018 1600  piperacillin-tazobactam (ZOSYN) IVPB 3.375 g  Status:  Discontinued     3.375 g 12.5 mL/hr over 240 Minutes Intravenous Every 8 hours 11/29/2018 1522 11/19/2018 0748   12/01/2018 0545  piperacillin-tazobactam (ZOSYN) IVPB 3.375 g     3.375 g 100 mL/hr over 30 Minutes Intravenous  Once 11/24/2018 0530 11/17/2018 0626   12/12/2018 0530  piperacillin-tazobactam (ZOSYN) IVPB 4.5 g  Status:  Discontinued     4.5 g 200 mL/hr over 30 Minutes Intravenous  Once 11/20/2018 0517 11/18/2018 0529       Assessment/Plan Perforatedsmall bowel with closed-loop obstruction  1.Exploratory laparotomy small bowel resection, lysis of adhesions, 1.5 hours, placement of wound VAC 11/26/2018 Dr. Coralie Keens 2. Exploratory laparotomy, washout and placement of wound VAC 11/29/2018 Dr. Marcello Moores [place]ett 3. Exploratory laparotomy with anastomosis of small bowel and closure of abdominal wall 11/24/2018 Dr. Marcello Moores [place]ett 4.Exploratory laparotomy, resection of previous anastomosis, wound VAC placement 12/11/2018 Dr. Autumn Messing 5. Reopening of recent laparotomy, small bowel resection, placement of wound VAC 11/18/2018, Dr. Dorris Fetch Byerly(FINDINGS:Pus throughout abdomen, though  minimal succus. Bilious rind over intestines. Small hole on distal segment of small bowel with small amount of bile)  6.EXPLORATORY LAPAROTOMY,WASH OUT OF ABDOMEN APPLICATION OF WOUND TXM(IWOEHOZ)07/19/4823 Dr. Ninfa Linden 7.Exploratory laparotomy with abdominal washout,Small bowel anastomosis07/29/2020 Dr. Dema Severin Has small bowel anastomosisleak There are no further surgical options  Respiratory failure - on vent Acute renal failure - followed by Dr. Mickel Crow. CRRT stopped 7/13 as kidney function improved and patient making urine DM Severe malnutrition - Pre albumn - 9.3 on 01/04/2019 On TPN Anemia Hgb 6 today, getting 1 unit PRBCs Code status DNR  FEN: N.p.o./TPN OI:BBCW currently UGQ:BVQX, heparin held due to anemia Follow-up: To be determined POC: Mattier,George Brother [phone]  [phone]  Matier,Vickie    [phone]   Plan:Appreciate palliative team assistance with family meetings and ongoing discussions of comfort care. As of now patient's family wishes to continue current interventions.     LOS: 1 days    Wellington Hampshire , [facility] 12/30/2018, 9:42 AM Pager: [phone]  Agree with above. Daughter, Karrisa Didio, at bedside. She had no specific questions or requests.  Alphonsa Overall, MD, [facility] Pager: [phone] Office phone:  [phone]

## 2018-12-30 NOTE — Progress Notes (Signed)
CRITICAL VALUE ALERT  Critical Value:  Hbg 6.0  Date & Time Notied:  7/16 & 0426  Provider Notified: elink on call  Orders Received/Actions taken: Type & Screen and tranfuse 2U PRBC

## 2018-12-31 MED ORDER — TRAVASOL 10 % IV SOLN
INTRAVENOUS | Status: AC
  Administered 2018-12-31: 18:00:00 via INTRAVENOUS
  Filled 2018-12-31: qty 480

## 2018-12-31 MED ORDER — NOREPINEPHRINE 4 MG/250ML-% IV SOLN
0.0000 ug/min | INTRAVENOUS | Status: DC
  Administered 2018-12-31: 21:00:00 2 ug/min via INTRAVENOUS
  Administered 2019-01-01: 3 ug/min via INTRAVENOUS
  Administered 2019-01-02: 4 ug/min via INTRAVENOUS
  Administered 2019-01-02: 9 ug/min via INTRAVENOUS
  Administered 2019-01-03: 7 ug/min via INTRAVENOUS
  Administered 2019-01-03 – 2019-01-04 (×3): 10 ug/min via INTRAVENOUS
  Administered 2019-01-04: 8 ug/min via INTRAVENOUS
  Administered 2019-01-04 – 2019-01-09 (×15): 10 ug/min via INTRAVENOUS
  Filled 2018-12-31 (×27): qty 250

## 2018-12-31 MED ORDER — ACETAMINOPHEN 10 MG/ML IV SOLN
1000.0000 mg | Freq: Four times a day (QID) | INTRAVENOUS | Status: AC
  Administered 2018-12-31 – 2019-01-01 (×4): 1000 mg via INTRAVENOUS
  Filled 2018-12-31 (×4): qty 100

## 2018-12-31 NOTE — Progress Notes (Signed)
PHARMACY - ADULT TOTAL PARENTERAL NUTRITION CONSULT NOTE   Pharmacy Consult for TPN Indication: prolonged ileus  HPI: [name] admitted 6/7 with perforated small bowel from closed-loop obstruction and underwent emergent exploratory laparotomy. Returned to OR on 6/9 and again on 6/14 for closure of abdomen.  Patient coded on 20-Dec-2022 and developed multiorgan failure. Remains intubated & on CRRT. Pressors have been weaned off and she is completing antibiotic course for sepsis/PNA.  NPO since admission on 6/7. Pharmacy consulted to start TPN 6/16. D/t acute liver injury from shock, initiated TPN at low rate & advance slowly as requested by surgery.  Patient Measurements: Height: '5\' 4"'  (162.6 cm) Weight: 143 lb 8.3 oz (65.1 kg) IBW/kg (Calculated) : 54.7 TPN AdjBW (KG): 59.7 Body mass index is 24.64 kg/m. Usual Weight: 90kg  Significant events:  6/18: OK to increase to goal rate per surgery 6/22: CRRT stopped for holiday; CBG 69 overnight, D50 given but TPN continued (55 units insulin in TPN); BS and BM overnight - initiating trickle feeds 6/23: resuming CRRT, return to high-protein TPN; fevers overnight, CCM removing some central lines; will use chemo port for TPN 6/24: began adding lipids to TPN formula 6/25: trickle feeds stopped d/t abd distention 6/26: CRRT clotted off overnight; patient to OR this AM for ex lap; RN attempting to restart CRRT post-op but still having issues at this time 6/27: CRRT appears to have been successfully resumed overnight 6/29 OR pus throughout abdomen. Small hole in small bowel 7/3 OR wash out of abd, replace wound VAC, abd remains open 7/6 sched for OR today for wash out abd, replace wound VAC 7/8 new leak at surgical site; patient is not a candidate for additional surgeries. To continue TPN pending [place] discussion 7/13 CRRT dc'd 7/14 septic overnight. Hypoglycemic. Wound draining what appears to be stool 7/17 off norepi. febrile  Central access: 11/30/2018 TPN  start date: 11/30/18  ASSESSMENT                                                                                                          Current Nutrition: NPO, TPN IVF: none  Today:   Glucose (goal 100-150) - Hx DM on 70/30 Novolog 20 units BID PTA.  7/14 Multiple episodes of hypoglycemia requiring D50 after CRRT stopped 7/13 - resolved 7/15, SSI/CBGs dc'd  Electrolytes - only Na in TPN, no other lytes in TPN  Renal - CRRT stopped 7/13, SCr up to 3.33 7/16, making urine 420/24 hrs  LFTs - albumin remains low; alk phos elevated, others WNL (7/16)  TGs - 166 (7/13)  Prealbumin -down to 7.8 (7/13) despite full support: reflects inflammation and critical illness  NUTRITIONAL GOALS  RD recs: (7/15):  Kcal:1423 kcal, Protein:130-143 gram Fluid:>/= 1.8 L/day  PLAN                                                                                                                          At 1800 today:  TPN at 40 ml/hr with no electrolytes (x Na) and no insulin.  Provides ~ 48 gm protein and ~ 970 kcals  Electrolytes: all lytes out of TPN x Na: Cl:Ac = max Cl  TPN to contain standard MVI daily  IVF per MD- none  DC SSI & CBGs 7/15  insulin out of TPN 7/14  Eudelia Bunch, Pharm.D [phone] 12/31/2018 10:05 AM

## 2018-12-31 NOTE — Consult Note (Signed)
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  Doing well since placement Monday. No areas that appear to be close for leakage. RR catheter in place for suction managing succus well.   Reviewed chart, continue to be an anticipated death on the vent in the hospital. No acute changes for WOC to address with fistula management. 1 extra Eakin and pattern in the room.    [place] nurse team will follow along for re-assessments of need for change of the Eakin pouch.  [place], [place], [place]

## 2018-12-31 NOTE — Progress Notes (Signed)
eLink Physician-Brief Progress Note Patient Name: Carmen Stone DOB: 10-Sep-1955 MRN: [id number]   Date of Service  12/31/2018  HPI/Events of Note  Discussion with daughter and brother of patient who are at the bedside and upset at the patients hypotension.  Current BP is 81/33.  I reviewed with the family the patient's ongoing decline and that the plan had been to make the patient comfortable and forego interventions such as pressor support.  They are adamant that the addition of a pressor will make the patient "feel better".  I pointed out repeatedly that given the patient's prolonged hospitalization and decline despite aggressive interventions that achieving a normotensive BP with pressor support will not change the patient's outcome at this time.  They agree to disagree and continue to request pressor support.  eICU Interventions  Plan: Initiate NE for BP support.  Re-addess family issues with primary rounding team in AM.     Intervention Category Major Interventions: End of life / care limitation discussion  Jaidon Ellery 12/31/2018, 8:36 PM

## 2018-12-31 NOTE — Progress Notes (Signed)
NAME:  Carmen Stone, MRN:  [id number], DOB:  1956/01/22, LOS: 63 ADMISSION DATE:  11/24/2018, CONSULTATION DATE:  11/21/2018 REFERRING MD:  Dr. Ninfa Linden, Surgery, CHIEF COMPLAINT:  Abdominal pain   Brief History   63 y/o female presented to ER 12/09/2018 with nausea, vomiting, abdominal pain.  CT abd/pelvis showed focal perforated loop of SB with free air with multiple complex ventral hernias.  Underwent emergent laparotomy with SB resection, lysis of adhesions and wound vac.  Remained on vent and pressors post op.  Hospital course complicated by respiratory arrest 6/13, multiple reintubations, intermittent vasopressor needs, AKI on CVVHD.     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, HTN, HLD, DM type II  Significant Hospital Events   6/07 Admit, to OR 6/08 transfuse FFP, on multiple pressors 6/09 To OR for wash out, start CRRT 6/13 Acute respiratory arrest overnight, off CRRT, on pressors, limit sedation 6/14 OR for bowel anastomosis and closure of abdominal wall 6/15 Changing volume goal on CRRT to -100 cc an hour. Weaned 7 hours PSV   6/16 Encephalopathic. Hypertensive, adding labetalol.  IV TNA started 6/18 Neuro status improved.  Required brief vasoactive drips.  Net negative fluid volume status.PSV 6/19 Extubated 6/20 Reintubated for altered mentation, head CT negative  6/23 Febrile overnight, Blood Cx.  6/25 L IJ, subclavian, axillary  + DVT. Heparin initiated. 6/26  CT ABD/pelvis positive for free air and ascites. Back to OR  for ex lap of perforated viscous, resection of previous anastomosis, application of wound vac. 1U RBCs intra op.   6/28 Back on pressors with hgb <7 / sedated on fent drip, PRBC 7/01 Less sedation, following commands, abd open with VAC, on CVVHD (even) 7/03 OR, unable to close but bowel "looks some better", hematoma in abd, on CVVHD 7/06 OR for laparotomy, washout, SB anastomosis  7/14 Febrile over night to 102.3,  Hypotensive 500 cc bolus and albumin, CVVHD d/c 7/15 no acute changes.  7/16 Hypotensive, anemia - 2 RBCs, fluid, and NE started 7/17: Febrile.  Off pressors.  Critical care team taking vasoactive drips off from medicine profile.  Nothing further to offer at this point.  Family still not on board with transition to comfort however agreeable to no escalation.  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    CT Chest 6/20 >> small bilateral pleural effusions with compressive atelectasis, anterior R 4-7 rib fractures CT ABD/Pelvis 6/20 >> small bowel surgical changes, mild circumferential wall thickening of scattered small bowel loops, small to moderate ascites LUE Duplex 6/24 >> DVT L IJ, subclavian, axillary, SVT L cephalic  CT ABD/Pelvis 9/21 >> large volume ascites with intraperitoneal free air   Micro Data:  COVID 6/07 >> negative Tracheal aspirate 6/20 >> E Coli sensitive to zosyn BCx2 6/23 >> negative BCx2  6/26 >> negative   Antimicrobials:  Zosyn 6/07 >> 6/17 Diflucan 6/07 >> 6/10 Cefepime 6/21 >>  6/26 Vanco 6/21 >> 6/25 Anidulafungin 6/26 >> 7/06 Zosyn 6/26 >>7/10  Interim history/subjective:  Hypotensive and febrile Objective   Blood pressure (Abnormal) 90/34, pulse (Abnormal) 108, temperature (Abnormal) 102.5 F (39.2 C), temperature source Axillary, resp. rate 18, height 5\' 4"  (1.626 m), weight 65.1 kg, SpO2 100 %.    Vent Mode: PRVC FiO2 (%):  [30 %]  30 % Set Rate:  [18 bmp] 18 bmp Vt Set:  [430 mL] 430 mL PEEP:  [5 cmH20] 5 cmH20 Plateau Pressure:  [19 cmH20-22 cmH20] 20 cmH20   Intake/Output Summary (Last 24 hours) at 12/31/2018 3149 Last data filed at 12/31/2018 0700 Gross per 24 hour  Intake 1387.16 ml  Output 970 ml  Net 417.16 ml   Filed Weights   12/24/18 0500 12/25/18 0500 12/27/18 0500  Weight: 64.3 kg 65 kg 65.1 kg   Physical Exam:  General this is a critically ill actively dying 63 year old black female who remains ventilator dependent and critically ill HEENT normocephalic atraumatic orally intubated no jugular venous distention mucous membranes moist Pulmonary: Ventilator dependent, equal chest rise on mechanical assisted breath clear with diminished breath sounds in the bases no accessory use Cardiac: Regular rate and rhythm without murmur rub or gallop Abdomen: Open abdomen, wound VAC draining feculent and purulent drainage no bowel sounds GU: Diminished urine output Via Foley catheter Neuro: Sedated Extremities: Intact pulses anasarca  Resolved Hospital Problem list     Assessment & Plan:   Ventilator dependence w/ failure to wean  in setting of prolonged critical illness, severe protein calorie malnutrition, metabolic encephalopathy and on-going peritonitis -She is not a candidate for tracheostomy Plan: Continuing VAP bundle PAD protocol Ventilator support  Abd peritonitis 2/2 anastomotic leak with recurrent septic shock -This is ongoing and not amendable to surgery  -Off antibiotics  -Still with significant leukocytosis -Ongoing fevers plan Wound care as directed by surgery We will not reinitiate antibiotics Treat fever with IV Tylenol  Acute renal failure  -Serum creatinine climbing but continues to make urine. -No longer has dialysis access -No longer a dialysis candidate Plan Monitoring urine output, limiting lab work  Anemia of critical illness, hemoglobin has dropped down to 6  -Received 2 units of blood Plan No further CBC checks  Severe Protein Malnutrition -We are not able to use her GI tract Plan: Continuing TPN, she is getting insulin coverage and this     Best practice:  Diet: TPN DVT prophylaxis: SCDs GI prophylaxis: PPI Mobility: Bedrest Code Status: DO NOT RESUSCITATE Disposition: She is terminally ill with incurable illness.  She is actively dying.  I will not be adding back vasoactive drips, we will treat fever, continue mechanical ventilation, nutritional support and wound care.  I highly recommend we transition to comfort however family has been resistant to this thus far.  She will die regardless of what we do.  Erick Colace ACNP-BC [place] Pager # [phone] OR # [phone] if no answer

## 2018-12-31 NOTE — Progress Notes (Signed)
Temp 103.1 orally, notified NP B. Kary Kos, patient received IV Tylenol this AM, will received additional dose in 6 hours, ice packs in place, will continue to monitor, no new orders received.

## 2018-12-31 NOTE — Progress Notes (Addendum)
[facility] Progress Note  11 Days Post-Op  Subjective: CC-  Daughter at bedside.  Patient remains febrile and hypotensive. Family does not wish to pursue comfort care at this time, will continue current measures.  Objective: Vital signs in last 24 hours: Temp:  [97.8 F (36.6 C)-103 F (39.4 C)] 102.5 F (39.2 C) (07/17 0600) Pulse Rate:  [88-118] 108 (07/17 0700) Resp:  [13-20] 18 (07/17 0700) BP: (86-121)/(34-60) 90/34 (07/17 0700) SpO2:  [97 %-100 %] 100 % (07/17 0740) FiO2 (%):  [30 %] 30 % (07/17 0740) Last BM Date: 12/30/18  Intake/Output from previous day: 07/16 0701 - 07/17 0700 In: 2509 [I.V.:2048.7; Blood:373.3; IV Piggyback:86.9] Out: 970 [Urine:420; Emesis/NG output:150; Drains:400] Intake/Output this shift: No intake/output data recorded.  PE: Gen:On the vent Pulm:coarse breath soundsbilaterally, mechanically ventilated Cardio: tachy Abd: Soft,grimaces in pain to palpation, Eakins pouchto suction with good seal/feculent drainage Ext:1-2+ edema BLE/BUE Skin: warm and dry Neuro: not following commands  Lab Results:  Recent Labs    12/30/18 0406  WBC 20.7*  HGB 6.0*  HCT 19.4*  PLT 329   BMET Recent Labs    12/28/18 0924 12/30/18 0406  NA 144 143  K 5.6* 3.0*  CL 91* 97*  CO2 40* 32  GLUCOSE 146* 382*  BUN 62* 84*  CREATININE 1.84* 3.33*  CALCIUM 8.2* 7.7*   PT/INR No results for input(s): LABPROT, INR in the last 72 hours. CMP     Component Value Date/Time   NA 143 12/30/2018 0406   NA 138 06/01/2017 0859   K 3.0 (L) 12/30/2018 0406   K 4.2 06/01/2017 0859   CL 97 (L) 12/30/2018 0406   CO2 32 12/30/2018 0406   CO2 24 06/01/2017 0859   GLUCOSE 382 (H) 12/30/2018 0406   GLUCOSE 118 06/01/2017 0859   BUN 84 (H) 12/30/2018 0406   BUN 15.2 06/01/2017 0859   CREATININE 3.33 (H) 12/30/2018 0406   CREATININE 0.79 06/02/2018 0856   CREATININE 0.7 06/01/2017 0859   CALCIUM 7.7 (L) 12/30/2018 0406   CALCIUM  9.0 06/01/2017 0859   PROT 6.7 12/30/2018 0406   PROT 7.0 06/01/2017 0859   ALBUMIN 2.0 (L) 12/30/2018 0406   ALBUMIN 3.7 06/01/2017 0859   AST 24 12/30/2018 0406   AST 13 (L) 06/02/2018 0856   AST 14 06/01/2017 0859   ALT 23 12/30/2018 0406   ALT 16 06/02/2018 0856   ALT 12 06/01/2017 0859   ALKPHOS 199 (H) 12/30/2018 0406   ALKPHOS 97 06/01/2017 0859   BILITOT 0.4 12/30/2018 0406   BILITOT 0.3 06/02/2018 0856   BILITOT 0.22 06/01/2017 0859   GFRNONAA 14 (L) 12/30/2018 0406   GFRNONAA >60 06/02/2018 0856   GFRAA 16 (L) 12/30/2018 0406   GFRAA >60 06/02/2018 0856   Lipase     Component Value Date/Time   LIPASE 24 11/26/2018 0355       Studies/Results: No results found.  Anti-infectives: Anti-infectives (From admission, onward)   Start     Dose/Rate Route Frequency Ordered Stop   12/14/2018 1030  clindamycin (CLEOCIN) 900 mg, gentamicin (GARAMYCIN) 240 mg in sodium chloride 0.9 % 1,000 mL for intraperitoneal lavage      Irrigation To Surgery 12/03/2018 1026 11/24/2018 1042   12/11/18 1500  anidulafungin (ERAXIS) 100 mg in sodium chloride 0.9 % 100 mL IVPB  Status:  Discontinued     100 mg 78 mL/hr over 100 Minutes Intravenous Every 24 hours 11/26/2018 1007 01/06/2019 1103   12/11/18 0600  piperacillin-tazobactam (ZOSYN) IVPB 3.375 g  Status:  Discontinued     3.375 g 100 mL/hr over 30 Minutes Intravenous Every 6 hours 12/11/18 0317 12/24/18 0737   12/05/2018 1800  piperacillin-tazobactam (ZOSYN) IVPB 2.25 g  Status:  Discontinued     2.25 g 100 mL/hr over 30 Minutes Intravenous Every 6 hours 11/30/2018 1413 12/11/18 0317   11/16/2018 1100  anidulafungin (ERAXIS) 200 mg in sodium chloride 0.9 % 200 mL IVPB     200 mg 78 mL/hr over 200 Minutes Intravenous  Once 11/24/2018 1007 12/12/2018 1456   12/03/2018 1100  piperacillin-tazobactam (ZOSYN) IVPB 3.375 g     3.375 g 100 mL/hr over 30 Minutes Intravenous  Once 12/11/2018 1027 12/07/2018 1245   12/08/18 1800  vancomycin (VANCOCIN) IVPB 750  mg/150 ml premix  Status:  Discontinued     750 mg 150 mL/hr over 60 Minutes Intravenous Every 48 hours 12/07/18 0822 12/07/18 0903   12/08/18 1000  ceFEPIme (MAXIPIME) 2 g in sodium chloride 0.9 % 100 mL IVPB  Status:  Discontinued     2 g 200 mL/hr over 30 Minutes Intravenous Every 24 hours 12/07/18 0822 12/07/18 0903   12/07/18 2200  ceFEPIme (MAXIPIME) 2 g in sodium chloride 0.9 % 100 mL IVPB  Status:  Discontinued     2 g 200 mL/hr over 30 Minutes Intravenous Every 12 hours 12/07/18 0903 12/07/2018 1027   12/07/18 1800  vancomycin (VANCOCIN) IVPB 750 mg/150 ml premix  Status:  Discontinued     750 mg 150 mL/hr over 60 Minutes Intravenous Every 24 hours 12/07/18 0903 12/09/18 1040   12/06/18 1800  vancomycin (VANCOCIN) IVPB 750 mg/150 ml premix  Status:  Discontinued     750 mg 150 mL/hr over 60 Minutes Intravenous Every 24 hours 12/05/18 1652 12/07/18 0822   12/05/18 1800  ceFEPIme (MAXIPIME) 2 g in sodium chloride 0.9 % 100 mL IVPB  Status:  Discontinued     2 g 200 mL/hr over 30 Minutes Intravenous Every 12 hours 12/05/18 1644 12/07/18 0822   12/05/18 1700  vancomycin (VANCOCIN) 1,250 mg in sodium chloride 0.9 % 250 mL IVPB     1,250 mg 166.7 mL/hr over 90 Minutes Intravenous  Once 12/05/18 1652 12/05/18 2019   12/06/2018 1800  fluconazole (DIFLUCAN) IVPB 200 mg  Status:  Discontinued     200 mg 100 mL/hr over 60 Minutes Intravenous Every 24 hours 12/09/2018 0748 11/26/2018 0847   12/09/2018 1800  fluconazole (DIFLUCAN) IVPB 400 mg  Status:  Discontinued     400 mg 100 mL/hr over 120 Minutes Intravenous Every 24 hours 12/11/2018 0847 12/01/2018 0856   12/06/2018 1400  piperacillin-tazobactam (ZOSYN) IVPB 2.25 g  Status:  Discontinued     2.25 g 100 mL/hr over 30 Minutes Intravenous Every 6 hours 12/06/2018 0748 12/01/18 1104   12/08/2018 0600  cefoTEtan (CEFOTAN) 2 g in sodium chloride 0.9 % 100 mL IVPB     2 g 200 mL/hr over 30 Minutes Intravenous On call to O.R. 11/22/18 1300 12/14/2018 0624    11/22/18 1800  fluconazole (DIFLUCAN) IVPB 400 mg  Status:  Discontinued     400 mg 100 mL/hr over 120 Minutes Intravenous Every 24 hours 11/17/2018 1528 11/24/2018 0748   11/22/18 1800  vancomycin (VANCOCIN) 1,500 mg in sodium chloride 0.9 % 500 mL IVPB  Status:  Discontinued     1,500 mg 250 mL/hr over 120 Minutes Intravenous Every 24 hours 11/22/18 0358 11/22/18 0750   11/22/18 0115  vancomycin (VANCOCIN) 1,500 mg in sodium chloride 0.9 % 500 mL IVPB     1,500 mg 250 mL/hr over 120 Minutes Intravenous  Once 11/22/18 0101 11/22/18 0319   11/16/2018 1630  fluconazole (DIFLUCAN) IVPB 800 mg     800 mg 100 mL/hr over 240 Minutes Intravenous  Once 11/26/2018 1527 12/09/2018 2013   12/06/2018 1600  piperacillin-tazobactam (ZOSYN) IVPB 3.375 g  Status:  Discontinued     3.375 g 12.5 mL/hr over 240 Minutes Intravenous Every 8 hours 11/22/2018 1522 12/12/2018 0748   11/30/2018 0545  piperacillin-tazobactam (ZOSYN) IVPB 3.375 g     3.375 g 100 mL/hr over 30 Minutes Intravenous  Once 11/16/2018 0530 12/14/2018 0626   12/04/2018 0530  piperacillin-tazobactam (ZOSYN) IVPB 4.5 g  Status:  Discontinued     4.5 g 200 mL/hr over 30 Minutes Intravenous  Once 12/14/2018 0517 12/01/2018 0529       Assessment/Plan Perforatedsmall bowel with closed-loop obstruction  1.Exploratory laparotomy small bowel resection, lysis of adhesions, 1.5 hours, placement of wound VAC 12/09/2018 Dr. Coralie Keens 2. Exploratory laparotomy, washout and placement of wound VAC 11/24/2018 Dr. Marcello Moores [place] 3. Exploratory laparotomy with anastomosis of small bowel and closure of abdominal wall 12/06/2018 Dr. Marcello Moores [place] 4.Exploratory laparotomy, resection of previous anastomosis, wound VAC placement 11/29/2018 Dr. Autumn Messing 5. Reopening of recent laparotomy, small bowel resection, placement of wound VAC 12/01/2018, Dr. Dorris Fetch Byerly(FINDINGS:Pus throughout abdomen, though minimal succus. Bilious rind over intestines. Small hole on distal  segment of small bowel with small amount of bile)  6.EXPLORATORY LAPAROTOMY,WASH OUT OF ABDOMEN APPLICATION OF WOUND CBJ(SEGBTDV)12/19/1605 Dr. Ninfa Linden 7.Exploratory laparotomy with abdominal washout,Small bowel anastomosis07/29/2020 Dr. Dema Severin Has small bowel anastomosisleak There are no further surgical options  Respiratory failure - on vent Acute renal failure - followed by Dr. Mickel Crow. CRRT stopped 7/13 as kidney function improved and patient making urine DM Severe malnutrition - Pre albumn - 9.3 on 12/22/2018 On TPN Anemia Hgb 6 (7/16) Code status DNR  FEN: N.p.o./TPN PX:TGGY currently IRS:WNIO, heparin held due to anemia Follow-up: To be determined POC: Mattier,George Brother [phone]  [phone]  Matier,Vickie    [phone]   Plan:I spoke with the patient's daughter at bedside. Will continue current interventions at this time.   No further surgical options.   Appreciate palliative team's family discussions, no desire to pursue comfort care at time time.    LOS: 40 days    [place]ellington [place] , [facility] 12/31/2018, 8:03 AM Pager: [phone]  Agree with above. No change in supportive care per family.  Alphonsa Overall, MD, [facility] Pager: [phone] Office phone:  [phone]

## 2019-01-01 MED ORDER — TRAVASOL 10 % IV SOLN
INTRAVENOUS | Status: AC
  Administered 2019-01-01: 18:00:00 via INTRAVENOUS
  Filled 2019-01-01: qty 480

## 2019-01-01 NOTE — Progress Notes (Signed)
Chart reviewed and note that Ms. Beitler was restarted on pressors.  I agree with Dr. Lamonte Sakai that we are at a point where it is necessary to have a multidisciplinary family meeting.    There has been disagreement between family and the care team regarding if particular interventions (such as pressors) are likely to be of benefit to Ms. Culliver moving forward. I would recommend arranging a family meeting early next week when ethics committee representation could also be part of the meeting as I believe we are at a point where any further decisions will need to be made with the futility policy being in consideration.  While I am happy to participate in any meeting before this time, I believe that the result of that meeting is likely to be the need of another meeting with ethics committee involvement.  Please see note from Norva Karvonen on 7/13 regarding this.  Micheline Rough, MD [facility] Team [phone]  NO CHARGE NOTE

## 2019-01-01 NOTE — Progress Notes (Signed)
NAME:  Carmen Stone, MRN:  [id number], DOB:  Jun 29, 1955, LOS: 49 ADMISSION DATE:  12/14/2018, CONSULTATION DATE:  11/21/2018 REFERRING MD:  Dr. Ninfa Linden, Surgery, CHIEF COMPLAINT:  Abdominal pain   Brief History   63 y/o female presented to ER 11/19/2018 with nausea, vomiting, abdominal pain.  CT abd/pelvis showed focal perforated loop of SB with free air with multiple complex ventral hernias.  Underwent emergent laparotomy with SB resection, lysis of adhesions and wound vac.  Remained on vent and pressors post op.  Hospital course complicated by respiratory arrest 6/13, multiple reintubations, intermittent vasopressor needs, AKI on CVVHD.     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, HTN, HLD, DM type II  Significant Hospital Events   6/07 Admit, to OR 6/08 transfuse FFP, on multiple pressors 6/09 To OR for wash out, start CRRT 6/13 Acute respiratory arrest overnight, off CRRT, on pressors, limit sedation 6/14 OR for bowel anastomosis and closure of abdominal wall 6/15 Changing volume goal on CRRT to -100 cc an hour. Weaned 7 hours PSV   6/16 Encephalopathic. Hypertensive, adding labetalol.  IV TNA started 6/18 Neuro status improved.  Required brief vasoactive drips.  Net negative fluid volume status.PSV 6/19 Extubated 6/20 Reintubated for altered mentation, head CT negative  6/23 Febrile overnight, Blood Cx.  6/25 L IJ, subclavian, axillary  + DVT. Heparin initiated. 6/26  CT ABD/pelvis positive for free air and ascites. Back to OR  for ex lap of perforated viscous, resection of previous anastomosis, application of wound vac. 1U RBCs intra op.   6/28 Back on pressors with hgb <7 / sedated on fent drip, PRBC 7/01 Less sedation, following commands, abd open with VAC, on CVVHD (even) 7/03 OR, unable to close but bowel "looks some better", hematoma in abd, on CVVHD 7/06 OR for laparotomy, washout, SB anastomosis  7/14 Febrile over night to 102.3,  Hypotensive 500 cc bolus and albumin, CVVHD d/c 7/15 no acute changes.  7/16 Hypotensive, anemia - 2 RBCs, fluid, and NE started 7/17: Febrile.  Off pressors.  Critical care team taking vasoactive drips off from medicine profile.  Nothing further to offer at this point.  Family still not on board with transition to comfort however agreeable to no escalation.  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    CT Chest 6/20 >> small bilateral pleural effusions with compressive atelectasis, anterior R 4-7 rib fractures CT ABD/Pelvis 6/20 >> small bowel surgical changes, mild circumferential wall thickening of scattered small bowel loops, small to moderate ascites LUE Duplex 6/24 >> DVT L IJ, subclavian, axillary, SVT L cephalic  CT ABD/Pelvis 5/00 >> large volume ascites with intraperitoneal free air   Micro Data:  COVID 6/07 >> negative Tracheal aspirate 6/20 >> E Coli sensitive to zosyn BCx2 6/23 >> negative BCx2  6/26 >> negative   Antimicrobials:  Zosyn 6/07 >> 6/17 Diflucan 6/07 >> 6/10 Cefepime 6/21 >>  6/26 Vanco 6/21 >> 6/25 Anidulafungin 6/26 >> 7/06 Zosyn 6/26 >>7/10  Interim history/subjective:  He has had continued intermittent fever, episodes of hypotension.  Fentanyl uptitrated given apparent discomfort.  Reviewed discussions overnight, Dr. Jimmy Footman with family.  Family insistent on reinitiation of pressors despite the overall futility of this.  Final  resolution was that norepinephrine was reinitiated.  Currently at 4  Objective   Blood pressure (!) 116/52, pulse (!) 102, temperature (!) 102.5 F (39.2 C), temperature source Axillary, resp. rate 19, height 5\' 4"  (1.626 m), weight 65.1 kg, SpO2 100 %.    Vent Mode: PRVC FiO2 (%):  [30 %] 30 % Set Rate:  [18 bmp] 18 bmp Vt Set:  [430 mL-460 mL] 460 mL PEEP:  [5 cmH20] 5 cmH20 Plateau Pressure:  [18 cmH20-21 cmH20] 18 cmH20   Intake/Output Summary (Last 24 hours) at 01/01/2019 1330 Last data filed at 01/01/2019 1131 Gross per 24 hour  Intake 1752.07 ml  Output 1760 ml  Net -7.93 ml   Filed Weights   12/24/18 0500 12/25/18 0500 12/27/18 0500  Weight: 64.3 kg 65 kg 65.1 kg   Physical Exam:  General critically ill, ventilated HEENT ET tube in place, oropharynx otherwise clear, poor dentition Pulmonary: Clear bilaterally, no wheeze Cardiac: Regular, borderline tachycardic, no murmur Abdomen: Open abdomen, wound VAC in place with fistula GU: Foley in place Neuro: Sedated, some grimace with stimulation Extremities: [place] [facility] Problem list     Assessment & Plan:   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  It has been days since she has done any pressure support.  At this point I would sacrifice the ability to do vent weaning in order to maintain comfort, continue her fentanyl drip  Abd peritonitis 2/2 anastomotic leak with recurrent septic shock -This is ongoing and not amendable to surgery  -Off antibiotics  -Still with significant leukocytosis -Ongoing fevers plan Wound care as directed by surgery No plans to reinitiate antibiotics Treat fever, IV Tylenol ordered Use of pressors for blood pressure support has been a point of contention with family who  have wanted reinitiation of norepinephrine despite the futility in light of her overall prognosis.  At this point resolution has been to reinitiate.  I think further discussions again have to take place with all family members present, palliative care participation, likely surgery and PCCM participation.  I will place a ceiling on the norepinephrine at 10 with no plans to uptitrate  Acute renal failure  -Serum creatinine climbing but continues to make urine. -No longer has dialysis access -No longer a dialysis candidate Plan Follow urine output Minimize labs  Anemia of critical illness, hemoglobin has dropped down to 6 -Received 2 units of blood Plan Minimize labs  Severe Protein Malnutrition -We are not able to use her GI tract Plan: Continuing TPN, she is getting insulin coverage with this     Best practice:  Diet: TPN DVT prophylaxis: SCDs GI prophylaxis: PPI Mobility: Bedrest Code Status: DO NOT RESUSCITATE Disposition: Her abdominal process is incurable and I believe her current interventions including mechanical ventilation, pressors are prolonging suffering.  I have relayed this to her daughter at bedside today.  I also clarified with her that given the reinitiation of pressors the mixed signals regarding their benefit, that another meeting with family, PCS, CCS will need to  take place.   Baltazar Apo, MD, PhD 01/01/2019, 1:38 PM [facility] [phone] or if no answer [phone]

## 2019-01-01 NOTE — Progress Notes (Signed)
Rn spoke with family members (brother and son) of pt. At the beginning of shift at the request of family about how low bp is. I explained to family that starting pressors back would not benefit the pt. And could possibly prolong her pain. I also explained to family that the AM rounding team felt that it was necessary and best for the pt. to not progress care further at this point. The family felt like they were not part of that decision and did not agree with that. Family continued to insist that the pt. Be on pressors. RN contacted [facility] Dr. Jimmy Footman spoke with family you can find in her personal note. Family disagreed with medical team and persisted that the pressors were to be started back. RN then started pressors.

## 2019-01-01 NOTE — Progress Notes (Signed)
PHARMACY - ADULT TOTAL PARENTERAL NUTRITION CONSULT NOTE   Pharmacy Consult for TPN Indication: prolonged ileus  HPI: 44 yoF admitted 6/7 with perforated small bowel from closed-loop obstruction and underwent emergent exploratory laparotomy. Returned to OR on 6/9 and again on 6/14 for closure of abdomen.  Patient coded on 12/16/22 and developed multiorgan failure. Remains intubated & on CRRT. Pressors have been weaned off and she is completing antibiotic course for sepsis/PNA.  NPO since admission on 6/7. Pharmacy consulted to start TPN 6/16. D/t acute liver injury from shock, initiated TPN at low rate & advance slowly as requested by surgery.  Patient Measurements: Height: '5\' 4"'  (162.6 cm) Weight: 143 lb 8.3 oz (65.1 kg) IBW/kg (Calculated) : 54.7 TPN AdjBW (KG): 59.7 Body mass index is 24.64 kg/m. Usual Weight: 90kg  Significant events:  6/18: OK to increase to goal rate per surgery 6/22: CRRT stopped for holiday; CBG 69 overnight, D50 given but TPN continued (55 units insulin in TPN); BS and BM overnight - initiating trickle feeds 6/23: resuming CRRT, return to high-protein TPN; fevers overnight, CCM removing some central lines; will use chemo port for TPN 6/24: began adding lipids to TPN formula 6/25: trickle feeds stopped d/t abd distention 6/26: CRRT clotted off overnight; patient to OR this AM for ex lap; RN attempting to restart CRRT post-op but still having issues at this time 6/27: CRRT appears to have been successfully resumed overnight 6/29 OR pus throughout abdomen. Small hole in small bowel 7/3 OR wash out of abd, replace wound VAC, abd remains open 7/6 sched for OR today for wash out abd, replace wound VAC 7/8 new leak at surgical site; patient is not a candidate for additional surgeries. To continue TPN pending [place] discussion 7/13 CRRT dc'd 7/14 septic overnight. Hypoglycemic. Wound draining what appears to be stool 7/17 off norepi. febrile 7/18 back on norepi,  febrile  Central access: 12/03/2018 TPN start date: 11/30/18  ASSESSMENT                                                                                                          Current Nutrition: NPO, TPN IVF: none  Today:   Glucose (goal 100-150) - Hx DM on 70/30 Novolog 20 units BID PTA.  7/14 Multiple episodes of hypoglycemia requiring D50 after CRRT stopped 7/13 - resolved 7/15, SSI/CBGs dc'd  Electrolytes - only Na in TPN, no other lytes in TPN  Renal - CRRT stopped 7/13, SCr up to 3.33 7/16, making urine 1225/24 hrs  LFTs - albumin remains low; alk phos elevated, others WNL (7/16)  TGs - 166 (7/13)  Prealbumin -down to 7.8 (7/13) despite full support: reflects inflammation and critical illness  NUTRITIONAL GOALS  RD recs: (7/15):  Kcal:1423 kcal, Protein:130-143 gram Fluid:>/= 1.8 L/day  PLAN                                                                                                                          At 1800 today:  TPN at 40 ml/hr with no electrolytes (x Na) and no insulin.  Provides ~ 48 gm protein and ~ 970 kcals  Electrolytes: all lytes out of TPN x Na: Cl:Ac = max Cl  TPN to contain standard MVI daily  IVF per MD- none  DC SSI & CBGs 7/15  insulin out of TPN 7/14  Eudelia Bunch, Pharm.D [phone] 01/01/2019 7:53 AM

## 2019-01-01 NOTE — Progress Notes (Signed)
12 Days Post-Op   Subjective/Chief Complaint: Intubated and sedated on vent. Still febrile   Objective: Vital signs in last 24 hours: Temp:  [100.6 F (38.1 C)-103.1 F (39.5 C)] 102.2 F (39 C) (07/18 0400) Pulse Rate:  [89-110] 91 (07/18 0600) Resp:  [15-18] 18 (07/18 0600) BP: (79-130)/(26-60) 123/39 (07/18 0600) SpO2:  [98 %-100 %] 100 % (07/18 0600) FiO2 (%):  [30 %] 30 % (07/18 0749) Last BM Date: 12/31/18  Intake/Output from previous day: 07/17 0701 - 07/18 0700 In: 1604.7 [I.V.:1318.7; IV Piggyback:286.1] Out: 2125 [Urine:1225; Emesis/NG output:150; Drains:750] Intake/Output this shift: No intake/output data recorded.  General appearance: intubated and sedated on vent Resp: clear to auscultation bilaterally and on vent Cardio: regular rate and rhythm GI: open abd wound with eakins pouch draining purulent looking material  Lab Results:  Recent Labs    12/30/18 0406  WBC 20.7*  HGB 6.0*  HCT 19.4*  PLT 329   BMET Recent Labs    12/30/18 0406  NA 143  K 3.0*  CL 97*  CO2 32  GLUCOSE 382*  BUN 84*  CREATININE 3.33*  CALCIUM 7.7*   PT/INR No results for input(s): LABPROT, INR in the last 72 hours. ABG No results for input(s): PHART, HCO3 in the last 72 hours.  Invalid input(s): PCO2, PO2  Studies/Results: No results found.  Anti-infectives: Anti-infectives (From admission, onward)   Start     Dose/Rate Route Frequency Ordered Stop   12/07/2018 1030  clindamycin (CLEOCIN) 900 mg, gentamicin (GARAMYCIN) 240 mg in sodium chloride 0.9 % 1,000 mL for intraperitoneal lavage      Irrigation To Surgery 12/11/2018 1026 12/11/2018 1042   12/11/18 1500  anidulafungin (ERAXIS) 100 mg in sodium chloride 0.9 % 100 mL IVPB  Status:  Discontinued     100 mg 78 mL/hr over 100 Minutes Intravenous Every 24 hours 11/18/2018 1007 12/21/2018 1103   12/11/18 0600  piperacillin-tazobactam (ZOSYN) IVPB 3.375 g  Status:  Discontinued     3.375 g 100 mL/hr over 30 Minutes  Intravenous Every 6 hours 12/11/18 0317 12/24/18 0737   11/25/2018 1800  piperacillin-tazobactam (ZOSYN) IVPB 2.25 g  Status:  Discontinued     2.25 g 100 mL/hr over 30 Minutes Intravenous Every 6 hours 12/09/2018 1413 12/11/18 0317   11/26/2018 1100  anidulafungin (ERAXIS) 200 mg in sodium chloride 0.9 % 200 mL IVPB     200 mg 78 mL/hr over 200 Minutes Intravenous  Once 12/02/2018 1007 12/09/2018 1456   12/05/2018 1100  piperacillin-tazobactam (ZOSYN) IVPB 3.375 g     3.375 g 100 mL/hr over 30 Minutes Intravenous  Once 11/18/2018 1027 11/29/2018 1245   12/08/18 1800  vancomycin (VANCOCIN) IVPB 750 mg/150 ml premix  Status:  Discontinued     750 mg 150 mL/hr over 60 Minutes Intravenous Every 48 hours 12/07/18 0822 12/07/18 0903   12/08/18 1000  ceFEPIme (MAXIPIME) 2 g in sodium chloride 0.9 % 100 mL IVPB  Status:  Discontinued     2 g 200 mL/hr over 30 Minutes Intravenous Every 24 hours 12/07/18 0822 12/07/18 0903   12/07/18 2200  ceFEPIme (MAXIPIME) 2 g in sodium chloride 0.9 % 100 mL IVPB  Status:  Discontinued     2 g 200 mL/hr over 30 Minutes Intravenous Every 12 hours 12/07/18 0903 12/04/2018 1027   12/07/18 1800  vancomycin (VANCOCIN) IVPB 750 mg/150 ml premix  Status:  Discontinued     750 mg 150 mL/hr over 60 Minutes Intravenous Every 24 hours  12/07/18 0903 12/09/18 1040   12/06/18 1800  vancomycin (VANCOCIN) IVPB 750 mg/150 ml premix  Status:  Discontinued     750 mg 150 mL/hr over 60 Minutes Intravenous Every 24 hours 12/05/18 1652 12/07/18 0822   12/05/18 1800  ceFEPIme (MAXIPIME) 2 g in sodium chloride 0.9 % 100 mL IVPB  Status:  Discontinued     2 g 200 mL/hr over 30 Minutes Intravenous Every 12 hours 12/05/18 1644 12/07/18 0822   12/05/18 1700  vancomycin (VANCOCIN) 1,250 mg in sodium chloride 0.9 % 250 mL IVPB     1,250 mg 166.7 mL/hr over 90 Minutes Intravenous  Once 12/05/18 1652 12/05/18 2019   12/12/2018 1800  fluconazole (DIFLUCAN) IVPB 200 mg  Status:  Discontinued     200 mg 100  mL/hr over 60 Minutes Intravenous Every 24 hours 11/22/2018 0748 11/17/2018 0847   11/18/2018 1800  fluconazole (DIFLUCAN) IVPB 400 mg  Status:  Discontinued     400 mg 100 mL/hr over 120 Minutes Intravenous Every 24 hours 11/22/2018 0847 12/12/2018 0856   12/09/2018 1400  piperacillin-tazobactam (ZOSYN) IVPB 2.25 g  Status:  Discontinued     2.25 g 100 mL/hr over 30 Minutes Intravenous Every 6 hours 11/17/2018 0748 12/01/18 1104   11/20/2018 0600  cefoTEtan (CEFOTAN) 2 g in sodium chloride 0.9 % 100 mL IVPB     2 g 200 mL/hr over 30 Minutes Intravenous On call to O.R. 11/22/18 1300 12/11/2018 0624   11/22/18 1800  fluconazole (DIFLUCAN) IVPB 400 mg  Status:  Discontinued     400 mg 100 mL/hr over 120 Minutes Intravenous Every 24 hours 12/11/2018 1528 11/18/2018 0748   11/22/18 1800  vancomycin (VANCOCIN) 1,500 mg in sodium chloride 0.9 % 500 mL IVPB  Status:  Discontinued     1,500 mg 250 mL/hr over 120 Minutes Intravenous Every 24 hours 11/22/18 0358 11/22/18 0750   11/22/18 0115  vancomycin (VANCOCIN) 1,500 mg in sodium chloride 0.9 % 500 mL IVPB     1,500 mg 250 mL/hr over 120 Minutes Intravenous  Once 11/22/18 0101 11/22/18 0319   11/24/2018 1630  fluconazole (DIFLUCAN) IVPB 800 mg     800 mg 100 mL/hr over 240 Minutes Intravenous  Once 11/26/2018 1527 12/04/2018 2013   11/22/2018 1600  piperacillin-tazobactam (ZOSYN) IVPB 3.375 g  Status:  Discontinued     3.375 g 12.5 mL/hr over 240 Minutes Intravenous Every 8 hours 12/04/2018 1522 11/24/2018 0748   12/14/2018 0545  piperacillin-tazobactam (ZOSYN) IVPB 3.375 g     3.375 g 100 mL/hr over 30 Minutes Intravenous  Once 11/30/2018 0530 11/17/2018 0626   11/29/2018 0530  piperacillin-tazobactam (ZOSYN) IVPB 4.5 g  Status:  Discontinued     4.5 g 200 mL/hr over 30 Minutes Intravenous  Once 12/04/2018 0517 11/22/2018 0529      Assessment/Plan: s/p Procedure(s): EXPLORATORY LAPAROTOMY WITH WOUND WASHOUT AND SMALL BOWEL ANASTOMOSIS Continue supportive care for now  Open wound  with fistula. Stable for now Continue tpn for malnutrition Family wants everything done that can be. They understand that there are no surgical options at this point. I spoke to them about if she does not improve then at some point they will need to think about what she would want and they will consider this  LOS: 41 days    Carmen Stone 01/01/2019

## 2019-01-02 LAB — GLUCOSE, CAPILLARY
Glucose-Capillary: 324 mg/dL — ABNORMAL HIGH (ref 70–99)
Glucose-Capillary: 325 mg/dL — ABNORMAL HIGH (ref 70–99)

## 2019-01-02 MED ORDER — ACETAMINOPHEN 10 MG/ML IV SOLN
1000.0000 mg | Freq: Once | INTRAVENOUS | Status: AC
  Administered 2019-01-02: 02:00:00 1000 mg via INTRAVENOUS
  Filled 2019-01-02: qty 100

## 2019-01-02 MED ORDER — TRAVASOL 10 % IV SOLN
INTRAVENOUS | Status: AC
  Administered 2019-01-02: 18:00:00 via INTRAVENOUS
  Filled 2019-01-02: qty 480

## 2019-01-02 NOTE — Progress Notes (Signed)
[facility] Physician-Brief Progress Note Patient Name: ELIZABTH PALKA DOB: [birth year] MRN: [id number]   Date of Service  01/02/2019  HPI/Events of Note  Fever to 102.7 F in spite of Ice packs and Tylenol.  eICU Interventions  Will order: 1. Cooling blanket PRN.     Intervention Category Major Interventions: Other:  Lysle Dingwall 01/02/2019, 9:23 PM

## 2019-01-02 NOTE — Progress Notes (Signed)
13 Days Post-Op   Subjective/Chief Complaint: Intubated and sedated on vent. Still on levophed   Objective: Vital signs in last 24 hours: Temp:  [101 F (38.3 C)-103.4 F (39.7 C)] 101.3 F (38.5 C) (07/19 0759) Pulse Rate:  [91-109] 101 (07/19 0730) Resp:  [0-19] 14 (07/19 0730) BP: (88-130)/(35-52) 122/45 (07/19 0730) SpO2:  [96 %-100 %] 100 % (07/19 0500) FiO2 (%):  [30 %] 30 % (07/19 0808) Last BM Date: 01/01/19  Intake/Output from previous day: 07/18 0701 - 07/19 0700 In: 1379.9 [I.V.:1279.8; IV Piggyback:100.1] Out: 2340 [Urine:1760; Emesis/NG output:155; Drains:425] Intake/Output this shift: No intake/output data recorded.  General appearance: intubated and sedated on vent. frail appearing Resp: clear to auscultation bilaterally and on vent Cardio: regular rate and rhythm GI: open wound with purulent drainage. eakins pouch in place  Lab Results:  No results for input(s): WBC, HGB, HCT, PLT in the last 72 hours. BMET No results for input(s): NA, K, CL, CO2, GLUCOSE, BUN, CREATININE, CALCIUM in the last 72 hours. PT/INR No results for input(s): LABPROT, INR in the last 72 hours. ABG No results for input(s): PHART, HCO3 in the last 72 hours.  Invalid input(s): PCO2, PO2  Studies/Results: No results found.  Anti-infectives: Anti-infectives (From admission, onward)   Start     Dose/Rate Route Frequency Ordered Stop   11/16/2018 1030  clindamycin (CLEOCIN) 900 mg, gentamicin (GARAMYCIN) 240 mg in sodium chloride 0.9 % 1,000 mL for intraperitoneal lavage      Irrigation To Surgery 12/07/2018 1026 12/14/2018 1042   12/11/18 1500  anidulafungin (ERAXIS) 100 mg in sodium chloride 0.9 % 100 mL IVPB  Status:  Discontinued     100 mg 78 mL/hr over 100 Minutes Intravenous Every 24 hours 11/18/2018 1007 12/27/2018 1103   12/11/18 0600  piperacillin-tazobactam (ZOSYN) IVPB 3.375 g  Status:  Discontinued     3.375 g 100 mL/hr over 30 Minutes Intravenous Every 6 hours 12/11/18 0317  12/24/18 0737   12/06/2018 1800  piperacillin-tazobactam (ZOSYN) IVPB 2.25 g  Status:  Discontinued     2.25 g 100 mL/hr over 30 Minutes Intravenous Every 6 hours 11/18/2018 1413 12/11/18 0317   12/05/2018 1100  anidulafungin (ERAXIS) 200 mg in sodium chloride 0.9 % 200 mL IVPB     200 mg 78 mL/hr over 200 Minutes Intravenous  Once 11/22/2018 1007 11/29/2018 1456   12/14/2018 1100  piperacillin-tazobactam (ZOSYN) IVPB 3.375 g     3.375 g 100 mL/hr over 30 Minutes Intravenous  Once 12/07/2018 1027 11/20/2018 1245   12/08/18 1800  vancomycin (VANCOCIN) IVPB 750 mg/150 ml premix  Status:  Discontinued     750 mg 150 mL/hr over 60 Minutes Intravenous Every 48 hours 12/07/18 0822 12/07/18 0903   12/08/18 1000  ceFEPIme (MAXIPIME) 2 g in sodium chloride 0.9 % 100 mL IVPB  Status:  Discontinued     2 g 200 mL/hr over 30 Minutes Intravenous Every 24 hours 12/07/18 0822 12/07/18 0903   12/07/18 2200  ceFEPIme (MAXIPIME) 2 g in sodium chloride 0.9 % 100 mL IVPB  Status:  Discontinued     2 g 200 mL/hr over 30 Minutes Intravenous Every 12 hours 12/07/18 0903 11/30/2018 1027   12/07/18 1800  vancomycin (VANCOCIN) IVPB 750 mg/150 ml premix  Status:  Discontinued     750 mg 150 mL/hr over 60 Minutes Intravenous Every 24 hours 12/07/18 0903 12/09/18 1040   12/06/18 1800  vancomycin (VANCOCIN) IVPB 750 mg/150 ml premix  Status:  Discontinued  750 mg 150 mL/hr over 60 Minutes Intravenous Every 24 hours 12/05/18 1652 12/07/18 0822   12/05/18 1800  ceFEPIme (MAXIPIME) 2 g in sodium chloride 0.9 % 100 mL IVPB  Status:  Discontinued     2 g 200 mL/hr over 30 Minutes Intravenous Every 12 hours 12/05/18 1644 12/07/18 0822   12/05/18 1700  vancomycin (VANCOCIN) 1,250 mg in sodium chloride 0.9 % 250 mL IVPB     1,250 mg 166.7 mL/hr over 90 Minutes Intravenous  Once 12/05/18 1652 12/05/18 2019   11/24/2018 1800  fluconazole (DIFLUCAN) IVPB 200 mg  Status:  Discontinued     200 mg 100 mL/hr over 60 Minutes Intravenous Every 24  hours 11/24/2018 0748 11/15/2018 0847   11/17/2018 1800  fluconazole (DIFLUCAN) IVPB 400 mg  Status:  Discontinued     400 mg 100 mL/hr over 120 Minutes Intravenous Every 24 hours 11/30/2018 0847 11/18/2018 0856   11/24/2018 1400  piperacillin-tazobactam (ZOSYN) IVPB 2.25 g  Status:  Discontinued     2.25 g 100 mL/hr over 30 Minutes Intravenous Every 6 hours 12/07/2018 0748 12/01/18 1104   12/09/2018 0600  cefoTEtan (CEFOTAN) 2 g in sodium chloride 0.9 % 100 mL IVPB     2 g 200 mL/hr over 30 Minutes Intravenous On call to O.R. 11/22/18 1300 11/22/2018 0624   11/22/18 1800  fluconazole (DIFLUCAN) IVPB 400 mg  Status:  Discontinued     400 mg 100 mL/hr over 120 Minutes Intravenous Every 24 hours 11/30/2018 1528 11/24/2018 0748   11/22/18 1800  vancomycin (VANCOCIN) 1,500 mg in sodium chloride 0.9 % 500 mL IVPB  Status:  Discontinued     1,500 mg 250 mL/hr over 120 Minutes Intravenous Every 24 hours 11/22/18 0358 11/22/18 0750   11/22/18 0115  vancomycin (VANCOCIN) 1,500 mg in sodium chloride 0.9 % 500 mL IVPB     1,500 mg 250 mL/hr over 120 Minutes Intravenous  Once 11/22/18 0101 11/22/18 0319   11/20/2018 1630  fluconazole (DIFLUCAN) IVPB 800 mg     800 mg 100 mL/hr over 240 Minutes Intravenous  Once 11/17/2018 1527 11/16/2018 2013   11/24/2018 1600  piperacillin-tazobactam (ZOSYN) IVPB 3.375 g  Status:  Discontinued     3.375 g 12.5 mL/hr over 240 Minutes Intravenous Every 8 hours 11/24/2018 1522 12/01/2018 0748   11/22/2018 0545  piperacillin-tazobactam (ZOSYN) IVPB 3.375 g     3.375 g 100 mL/hr over 30 Minutes Intravenous  Once 11/26/2018 0530 11/15/2018 0626   11/15/2018 0530  piperacillin-tazobactam (ZOSYN) IVPB 4.5 g  Status:  Discontinued     4.5 g 200 mL/hr over 30 Minutes Intravenous  Once 12/14/2018 0517 11/20/2018 0529      Assessment/Plan: s/p Procedure(s): EXPLORATORY LAPAROTOMY WITH WOUND WASHOUT AND SMALL BOWEL ANASTOMOSIS Continue tpn for nutrition support  Continue supportive care for now Open wound with  fistula. Stable Prognosis poor  LOS: 42 days    Autumn Messing III 01/02/2019

## 2019-01-02 NOTE — Progress Notes (Signed)
The patient is currently maxed out on Levophed at 38mcg/min. Blood pressures are not stable. Temperature 102.7 axillary despite rectal Tylenol. Will hold the cooling blanket due to patient's status. Family aware. E-link notified. Will continue to monitor.

## 2019-01-02 NOTE — Progress Notes (Signed)
Pt's brother Iona Beard) was bedside when I arrived. He talked of his only sibling and how she is a good person, thinks of others, and many very positive adjectives. He said they are still hoping for a miracle and we also talked about the will of God. He only asked for prayer. He told me pt has a daughter. I had prayer w/brother bedside of pt. Please page if additional assistance is needed. [place], MDiv   01/02/19 1700  Clinical Encounter Type  Visited With Patient and family together

## 2019-01-02 NOTE — Progress Notes (Signed)
eLink Physician-Brief Progress Note Patient Name: Carmen Stone DOB: 23-Nov-1955 MRN: [id number]   Date of Service  01/02/2019  HPI/Events of Note  Fever to 103.4 F - NPO. Request for Tylenol IV.  eICU Interventions  Will order: Tylenol IV x 1.      Intervention Category Major Interventions: Other:  Magally Vahle Cornelia Copa 01/02/2019, 1:25 AM

## 2019-01-02 NOTE — Progress Notes (Signed)
PHARMACY - ADULT TOTAL PARENTERAL NUTRITION CONSULT NOTE   Pharmacy Consult for TPN Indication: prolonged ileus  HPI: 66 yoF admitted 6/7 with perforated small bowel from closed-loop obstruction and underwent emergent exploratory laparotomy. Returned to OR on 6/9 and again on 6/14 for closure of abdomen.  Patient coded on December 26, 2022 and developed multiorgan failure. Remains intubated & on CRRT. Pressors have been weaned off and she is completing antibiotic course for sepsis/PNA.  NPO since admission on 6/7. Pharmacy consulted to start TPN 6/16. D/t acute liver injury from shock, initiated TPN at low rate & advance slowly as requested by surgery.  Patient Measurements: Height: '5\' 4"'  (162.6 cm) Weight: 143 lb 8.3 oz (65.1 kg) IBW/kg (Calculated) : 54.7 TPN AdjBW (KG): 59.7 Body mass index is 24.64 kg/m. Usual Weight: 90kg  Significant events:  6/18: OK to increase to goal rate per surgery 6/22: CRRT stopped for holiday; CBG 69 overnight, D50 given but TPN continued (55 units insulin in TPN); BS and BM overnight - initiating trickle feeds 6/23: resuming CRRT, return to high-protein TPN; fevers overnight, CCM removing some central lines; will use chemo port for TPN 6/24: began adding lipids to TPN formula 6/25: trickle feeds stopped d/t abd distention 6/26: CRRT clotted off overnight; patient to OR this AM for ex lap; RN attempting to restart CRRT post-op but still having issues at this time 6/27: CRRT appears to have been successfully resumed overnight 6/29 OR pus throughout abdomen. Small hole in small bowel 7/3 OR wash out of abd, replace wound VAC, abd remains open 7/6 sched for OR today for wash out abd, replace wound VAC 7/8 new leak at surgical site; patient is not a candidate for additional surgeries. To continue TPN pending Vayas discussion 7/13 CRRT dc'd 7/14 septic overnight. Hypoglycemic. Wound draining what appears to be stool 7/17 off norepi. febrile 7/18 back on norepi,  febrile  Central access: 11/26/2018 TPN start date: 11/30/18  ASSESSMENT                                                                                                          Current Nutrition: NPO, TPN IVF: none  Today:   Glucose (goal 100-150) - Hx DM on 70/30 Novolog 20 units BID PTA.  7/14 Multiple episodes of hypoglycemia requiring D50 after CRRT stopped 7/13 - resolved 7/15, SSI/CBGs dc'd  Electrolytes - only Na in TPN, no other lytes in TPN  Renal - CRRT stopped 7/13, SCr up to 3.33 7/16, making urine 1760/24 hrs  LFTs - albumin remains low; alk phos elevated, others WNL (7/16)  TGs - 166 (7/13)  Prealbumin -down to 7.8 (7/13) despite full support: reflects inflammation and critical illness  NUTRITIONAL GOALS  RD recs: (7/15):  Kcal:1423 kcal, Protein:130-143 gram Fluid:>/= 1.8 L/day  PLAN                                                                                                                          At 1800 today:  TPN at 40 ml/hr with no electrolytes (x Na) and no insulin.  Provides ~ 48 gm protein and ~ 970 kcals  Electrolytes: all lytes out of TPN x Na: Cl:Ac = max Cl  TPN to contain standard MVI daily  IVF per MD- none  DC SSI & CBGs 7/15  insulin out of TPN 7/14  Eudelia Bunch, Pharm.D [phone] 01/02/2019 8:58 AM

## 2019-01-03 LAB — BPAM RBC
Blood Product Expiration Date: 202008152359
Blood Product Expiration Date: 202008152359
ISSUE DATE / TIME: 202007160559
Unit Type and Rh: 5100
Unit Type and Rh: 5100

## 2019-01-03 LAB — TYPE AND SCREEN
ABO/RH(D): O POS
Antibody Screen: NEGATIVE
Unit division: 0
Unit division: 0

## 2019-01-03 MED ORDER — TRAVASOL 10 % IV SOLN
INTRAVENOUS | Status: AC
  Administered 2019-01-03: 17:00:00 via INTRAVENOUS
  Filled 2019-01-03: qty 480

## 2019-01-03 NOTE — Progress Notes (Signed)
14 Days Post-Op   Subjective/Chief Complaint: No significant changes.  Still on levo.   Objective: Vital signs in last 24 hours: Temp:  [97.2 F (36.2 C)-103.3 F (39.6 C)] 97.2 F (36.2 C) (07/20 0757) Pulse Rate:  [46-126] 95 (07/20 0800) Resp:  [13-25] 18 (07/20 0800) BP: (79-164)/(26-92) 132/47 (07/20 0800) SpO2:  [96 %-100 %] 99 % (07/20 0800) FiO2 (%):  [30 %] 30 % (07/20 0718) Last BM Date: 01/02/19  Intake/Output from previous day: 07/19 0701 - 07/20 0700 In: 1531.3 [I.V.:1531.3] Out: 3230 [Urine:2030; Emesis/NG output:100; Drains:1100] Intake/Output this shift: No intake/output data recorded.  General appearance: intubated on vent. frail appearing.  Opens eyes to voice and moves all extremities.   Resp: synchronous with vent.   Cardio: regular rate and rhythm GI: open wound with succus managed with eakins pouch in place  Lab Results:  No results for input(s): WBC, HGB, HCT, PLT in the last 72 hours. BMET No results for input(s): NA, K, CL, CO2, GLUCOSE, BUN, CREATININE, CALCIUM in the last 72 hours. PT/INR No results for input(s): LABPROT, INR in the last 72 hours. ABG No results for input(s): PHART, HCO3 in the last 72 hours.  Invalid input(s): PCO2, PO2  Studies/Results: No results found.  Anti-infectives: Anti-infectives (From admission, onward)   Start     Dose/Rate Route Frequency Ordered Stop   11/29/2018 1030  clindamycin (CLEOCIN) 900 mg, gentamicin (GARAMYCIN) 240 mg in sodium chloride 0.9 % 1,000 mL for intraperitoneal lavage      Irrigation To Surgery 11/30/2018 1026 11/16/2018 1042   12/11/18 1500  anidulafungin (ERAXIS) 100 mg in sodium chloride 0.9 % 100 mL IVPB  Status:  Discontinued     100 mg 78 mL/hr over 100 Minutes Intravenous Every 24 hours 12/14/2018 1007 12/29/2018 1103   12/11/18 0600  piperacillin-tazobactam (ZOSYN) IVPB 3.375 g  Status:  Discontinued     3.375 g 100 mL/hr over 30 Minutes Intravenous Every 6 hours 12/11/18 0317 12/24/18  0737   11/24/2018 1800  piperacillin-tazobactam (ZOSYN) IVPB 2.25 g  Status:  Discontinued     2.25 g 100 mL/hr over 30 Minutes Intravenous Every 6 hours 11/20/2018 1413 12/11/18 0317   12/11/2018 1100  anidulafungin (ERAXIS) 200 mg in sodium chloride 0.9 % 200 mL IVPB     200 mg 78 mL/hr over 200 Minutes Intravenous  Once 11/19/2018 1007 11/26/2018 1456   12/08/2018 1100  piperacillin-tazobactam (ZOSYN) IVPB 3.375 g     3.375 g 100 mL/hr over 30 Minutes Intravenous  Once 11/19/2018 1027 12/05/2018 1245   12/08/18 1800  vancomycin (VANCOCIN) IVPB 750 mg/150 ml premix  Status:  Discontinued     750 mg 150 mL/hr over 60 Minutes Intravenous Every 48 hours 12/07/18 0822 12/07/18 0903   12/08/18 1000  ceFEPIme (MAXIPIME) 2 g in sodium chloride 0.9 % 100 mL IVPB  Status:  Discontinued     2 g 200 mL/hr over 30 Minutes Intravenous Every 24 hours 12/07/18 0822 12/07/18 0903   12/07/18 2200  ceFEPIme (MAXIPIME) 2 g in sodium chloride 0.9 % 100 mL IVPB  Status:  Discontinued     2 g 200 mL/hr over 30 Minutes Intravenous Every 12 hours 12/07/18 0903 12/09/2018 1027   12/07/18 1800  vancomycin (VANCOCIN) IVPB 750 mg/150 ml premix  Status:  Discontinued     750 mg 150 mL/hr over 60 Minutes Intravenous Every 24 hours 12/07/18 0903 12/09/18 1040   12/06/18 1800  vancomycin (VANCOCIN) IVPB 750 mg/150 ml premix  Status:  Discontinued     750 mg 150 mL/hr over 60 Minutes Intravenous Every 24 hours 12/05/18 1652 12/07/18 0822   12/05/18 1800  ceFEPIme (MAXIPIME) 2 g in sodium chloride 0.9 % 100 mL IVPB  Status:  Discontinued     2 g 200 mL/hr over 30 Minutes Intravenous Every 12 hours 12/05/18 1644 12/07/18 0822   12/05/18 1700  vancomycin (VANCOCIN) 1,250 mg in sodium chloride 0.9 % 250 mL IVPB     1,250 mg 166.7 mL/hr over 90 Minutes Intravenous  Once 12/05/18 1652 12/05/18 2019   12/14/2018 1800  fluconazole (DIFLUCAN) IVPB 200 mg  Status:  Discontinued     200 mg 100 mL/hr over 60 Minutes Intravenous Every 24 hours  11/22/2018 0748 11/22/2018 0847   12/14/2018 1800  fluconazole (DIFLUCAN) IVPB 400 mg  Status:  Discontinued     400 mg 100 mL/hr over 120 Minutes Intravenous Every 24 hours 12/03/2018 0847 11/16/2018 0856   11/19/2018 1400  piperacillin-tazobactam (ZOSYN) IVPB 2.25 g  Status:  Discontinued     2.25 g 100 mL/hr over 30 Minutes Intravenous Every 6 hours 11/20/2018 0748 12/01/18 1104   11/20/2018 0600  cefoTEtan (CEFOTAN) 2 g in sodium chloride 0.9 % 100 mL IVPB     2 g 200 mL/hr over 30 Minutes Intravenous On call to O.R. 11/22/18 1300 12/05/2018 0624   11/22/18 1800  fluconazole (DIFLUCAN) IVPB 400 mg  Status:  Discontinued     400 mg 100 mL/hr over 120 Minutes Intravenous Every 24 hours 11/16/2018 1528 11/18/2018 0748   11/22/18 1800  vancomycin (VANCOCIN) 1,500 mg in sodium chloride 0.9 % 500 mL IVPB  Status:  Discontinued     1,500 mg 250 mL/hr over 120 Minutes Intravenous Every 24 hours 11/22/18 0358 11/22/18 0750   11/22/18 0115  vancomycin (VANCOCIN) 1,500 mg in sodium chloride 0.9 % 500 mL IVPB     1,500 mg 250 mL/hr over 120 Minutes Intravenous  Once 11/22/18 0101 11/22/18 0319   11/15/2018 1630  fluconazole (DIFLUCAN) IVPB 800 mg     800 mg 100 mL/hr over 240 Minutes Intravenous  Once 11/15/2018 1527 12/09/2018 2013   12/04/2018 1600  piperacillin-tazobactam (ZOSYN) IVPB 3.375 g  Status:  Discontinued     3.375 g 12.5 mL/hr over 240 Minutes Intravenous Every 8 hours 11/20/2018 1522 12/12/2018 0748   11/29/2018 0545  piperacillin-tazobactam (ZOSYN) IVPB 3.375 g     3.375 g 100 mL/hr over 30 Minutes Intravenous  Once 12/12/2018 0530 11/22/2018 0626   12/11/2018 0530  piperacillin-tazobactam (ZOSYN) IVPB 4.5 g  Status:  Discontinued     4.5 g 200 mL/hr over 30 Minutes Intravenous  Once 11/29/2018 0517 11/19/2018 0529      Assessment/Plan: s/p Procedure(s): EXPLORATORY LAPAROTOMY WITH WOUND WASHOUT AND SMALL BOWEL ANASTOMOSIS  Pt has had 8 surgeries with minimal small bowel left in place.  Last surgery with small bowel  anastamosis that broke down.  Ostomy was not feasible at that operation.    Continue tpn for nutrition support  Continue supportive care for now Open wound with fistula with good wound management.    Discussed poor prognosis with niece at bedside.     LOS: 43 days    Stark Klein 01/03/2019

## 2019-01-03 NOTE — Progress Notes (Addendum)
PHARMACY - ADULT TOTAL PARENTERAL NUTRITION CONSULT NOTE   Pharmacy Consult for TPN Indication: prolonged ileus  HPI: 22 yoF admitted 6/7 with perforated small bowel from closed-loop obstruction. Failed multiple surgical attempts at bowel repair, resulting in removal of all but a few inches of small bowel. No chance for recovery but not yet made comfort care d/t family wishes. Per CCM, continue TPN but  no further labs will be drawn   ASSESSMENT                                                                                         No labs/CBGs active, continues on vent, TPN, pressors  PLAN                                                                                                         As pharmacy has been requested to continue TPN without any form of monitoring, will order the below generic TPN daily as requested, but will otherwise sign off, deferring any additional management of TPN to MD.  Reorder daily at 1800:  TPN at 40 ml/hr with no electrolytes (except Na 50 mEq/L), no trace elements, and no insulin.  Provides ~ 48 gm protein and ~ 970 kcals  Cl:Ac = max Cl  TPN to contain standard MVI daily   Reuel Boom, PharmD, BCPS [phone] 01/03/2019, 8:08 AM

## 2019-01-03 NOTE — Consult Note (Signed)
[facility] Nurse wound follow up Wound type:Midline wound with fistula managed by large Eakin pouch and attached to suction. Still intact on day 7. Patient condition as noted in medical provider notes. Will not change pouch unless leaking.  [facility] nursing team will follow from a distance, and will remain available to this patient, the nursing and medical teams.   Thanks, Maudie Flakes, MSN, RN, [place], Arther Abbott  Pager# [phone]

## 2019-01-03 NOTE — Progress Notes (Signed)
Daily Progress Note   Patient Name: Carmen Stone       Date: 01/03/2019 DOB: 1955/10/27  Age: 63 y.o. MRN#: [id number] Attending Physician: Nolon Nations, MD Primary Care Physician: Jettie Booze, NP Admit Date: 11/22/2018  Reason for Consultation/Follow-up: Establishing goals of care  Subjective: Carmen Stone is resting in bed, she appears chronically ill, doesn't arouse or engage.   No family at bedside.    I called and discussed again with Carmen Stone from the ethics committee.  Discussed events of the weekend and working to set up another meeting with ethics involvement as we are unfortunately at a point where her care team agrees we are providing interventions that are causing suffering without realistic possibility of her surviving.  He will work to determine Probation officer member to facilitate meeting.    I called and discussed with patient's brother/HCPOA that we feel it is necessary to have another meeting to discuss Carmen Stone's care.  We discussed continuation of supportive care and disagreement between family and her care team about if she is benefiting from continued aggressive interventions.  I advised him that I had requested the ethics team to facilitate a meeting to discuss interventions moving forward and to discuss if certain interventions were in fact benefiting her.  He stated that he was agreeable but could "only meet after 7PM."  I explained that this was not going to be an option and that someone will be in touch once I hear back regarding the availability of ethics committee member to find time that will work best for him.  Length of Stay: 71  Current Medications: Scheduled Meds:  . chlorhexidine gluconate (MEDLINE KIT)  15 mL Mouth Rinse BID  .  Chlorhexidine Gluconate Cloth  6 each Topical Daily  . dorzolamide-timolol  1 drop Both Eyes BID  . mouth rinse  15 mL Mouth Rinse 10 times per day  . pantoprazole (PROTONIX) IV  40 mg Intravenous Q24H    Continuous Infusions: . sodium chloride Stopped (12/25/18 0957)  . fentaNYL infusion INTRAVENOUS 300 mcg/hr (01/03/19 1200)  . norepinephrine (LEVOPHED) Adult infusion 6 mcg/min (01/03/19 1200)  . TPN ADULT (ION) 40 mL/hr at 01/03/19 1200  . TPN ADULT (ION)      PRN Meds: sodium chloride, acetaminophen, albuterol, fentaNYL, fentaNYL, hydrALAZINE, metoprolol  tartrate, midazolam, sodium chloride flush  Physical Exam         General: Intubated.  Heart: Regular rate and rhythm.  Lungs: Good air movement on vent. Ext: No significant edema Skin: Warm and dry  Vital Signs: BP (!) 104/52   Pulse 81   Temp 98.2 F (36.8 C) (Axillary)   Resp 11   Ht '5\' 4"'  (1.626 m)   Wt 65.1 kg   SpO2 99%   BMI 24.64 kg/m  SpO2: SpO2: 99 % O2 Device: O2 Device: Ventilator O2 Flow Rate: O2 Flow Rate (L/min): 4 L/min  Intake/output summary:   Intake/Output Summary (Last 24 hours) at 01/03/2019 1400 Last data filed at 01/03/2019 1200 Gross per 24 hour  Intake 2114.71 ml  Output 2220 ml  Net -105.29 ml   LBM: Last BM Date: 01/02/19 Baseline Weight: Weight: 74.8 kg Most recent weight: Weight: 65.1 kg       Palliative Assessment/Data:    Flowsheet Rows     Most Recent Value  Intake Tab  Referral Department  Surgery  Unit at Time of Referral  ICU  Palliative Care Primary Diagnosis  Sepsis/Infectious Disease  Date Notified  12/02/2018  Palliative Care Type  New Palliative care  Reason for referral  Clarify Goals of Care  Date of Admission  12/06/2018  Date first seen by Palliative Care  12/14/18  # of days Palliative referral response time  1 Day(s)  # of days IP prior to Palliative referral  22  Clinical Assessment  Palliative Performance Scale Score  10%  Psychosocial & Spiritual  Assessment  Palliative Care Outcomes  Palliative Care Outcomes  Clarified goals of care      Patient Active Problem List   Diagnosis Date Noted  . Dyspnea   . Palliative care by specialist   . Goals of care, counseling/discussion   . Acute respiratory failure with hypoxia and hypercapnia ([place]) 12/12/2018  . Toxic metabolic encephalopathy 64/40/3474  . Thrombocytopenia, acquired ([place]) 12/12/2018  . Small bowel perforation ([place])   . Pressure injury of skin 11/29/2018  . Encounter for central line placement   . Septic shock ([place])   . AKI (acute kidney injury) ([place])   . S/P dialysis catheter insertion ([place])   . 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 Perforated viscus 12/09/2018  . Port-A-Cath in place 08/03/2017  . Port catheter in place 01/31/2016  . Hypersensitivity reaction 02/08/2014  . Drug induced neutropenia(288.03) 01/30/2014  . Nausea with vomiting 11/04/2013  . 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 ATN (acute tubular necrosis) 11/02/2013  . Acute respiratory failure ([place]) 11/02/2013  . Acute respiratory failure with hypoxia ([place]) 10/25/2013  . Abnormal urinalysis 10/25/2013  . Postoperative anemia due to acute blood loss 10/24/2013  . Breast abscess of female 10/22/2013  . HYPERLIPIDEMIA 07/16/2010  . ATELECTASIS 07/16/2010  . Type 2 diabetes mellitus ([place]) 06/24/2010  . 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 HYPOXEMIA 06/24/2010  . SMALL BOWEL OBSTRUCTION, HX OF 06/24/2010    Palliative Care Assessment & Plan   Patient Profile: 64 y.o. female  with past medical history of  admitted on 11/19/2018 with abdominal pain from perforation and complicated clinical course with multiple surgeries requiring resection of large portion of small bowel, code blue, and multisystem organ failure (EF 20% and vent dependent).  CRRT stopped with no plan to restart.     Assessment: Patient Active Problem List   Diagnosis Date Noted  .  Dyspnea   .  Palliative care by specialist   . Goals of care, counseling/discussion   . Acute respiratory failure with hypoxia and hypercapnia ([place]) 12/12/2018  . Toxic metabolic encephalopathy 42/35/3614  . Thrombocytopenia, acquired ([place]) 12/12/2018  . Small bowel perforation ([place])   . Pressure injury of skin 11/29/2018  . Encounter for central line placement   . Septic shock ([place])   . AKI (acute kidney injury) ([place])   . S/P dialysis catheter insertion ([place])   . 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 Perforated viscus 12/12/2018  . Port-A-Cath in place 08/03/2017  . Port catheter in place 01/31/2016  . Hypersensitivity reaction 02/08/2014  . Drug induced neutropenia(288.03) 01/30/2014  . Nausea with vomiting 11/04/2013  . 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 ATN (acute tubular necrosis) 11/02/2013  . Acute respiratory failure ([place]) 11/02/2013  . Acute respiratory failure with hypoxia ([place]) 10/25/2013  . Abnormal urinalysis 10/25/2013  . Postoperative anemia due to acute blood loss 10/24/2013  . Breast abscess of female 10/22/2013  . HYPERLIPIDEMIA 07/16/2010  . ATELECTASIS 07/16/2010  . Type 2 diabetes mellitus ([place]) 06/24/2010  . 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 HYPOXEMIA 06/24/2010  . SMALL BOWEL OBSTRUCTION, HX OF 06/24/2010    Recommendations/Plan: - DNR - Multiple discussions have been held with family by multiple providers over the past 3 weeks that Ms. Rozas is approaching end of life regardless of interventions moving forward.  Agree with surgery team and PCCM that we are extending suffering without realistic possibility of her improving or surviving. - I discussed with Dr. Lamonte Sakai and Marni Griffon from PCCM who agree that we are at a point where it is necessary to have another meeting with family and ethics to discuss her care moving forward.  We are unfortunately at a point where it may be necessary to revise her  care plan with consideration for the Medical Futility policy. - I have also discussed with Carmen Stone from the ethics team who has previously reviewed her case.  He will work to determine Probation officer member to be present/facilitate family meeting.  I notified her brother of need for another meeting with ethics involvement to clarify care plan moving forward. - Unfortunately, I am going off service but I will ask another member of the palliative team to follow up with family and care team.  Code Status: DNR  Prognosis:   Poor  Discharge Planning:  Anticipated hospital death.   Care plan was discussed with PCCM, ethics, RN, and patient's brother    Thank you for allowing the Palliative Medicine Team to assist in the care of this patient.   Time In: 1345 Time Out: 1425 Total Time 40 Prolonged Time Billed No      Greater than 50%  of this time was spent counseling and coordinating care related to the above assessment and plan.  Micheline Rough, MD [facility] Team [phone]  Please contact Palliative Medicine Team phone at [phone] for questions and concerns.

## 2019-01-03 NOTE — Progress Notes (Signed)
Plan for another family meeting tomorrow with PCCM, palliative, and ethics committee.  I discussed with brother the need for another meeting earlier today and he is aware this will also include the ethics committee.  Time for meeting is set for tomorrow at 2:45PM.  Dr. Andria Frames from ethics committee will be present to facilitate.  Marni Griffon and Dr. Rowe Pavy from palliative also plan to attend.  Micheline Rough, MD [facility] Palliative Medicine Team [phone]  NO CHARGE NOTE

## 2019-01-03 NOTE — Progress Notes (Addendum)
NAME:  Carmen Stone, MRN:  [id number], DOB:  [birth year], LOS: 47 ADMISSION DATE:  12/14/2018, CONSULTATION DATE:  11/21/2018 REFERRING MD:  Dr. Ninfa Linden, Surgery, CHIEF COMPLAINT:  Abdominal pain   Brief History   63 y/o female presented to ER 12/02/2018 with nausea, vomiting, abdominal pain.  CT abd/pelvis showed focal perforated loop of SB with free air with multiple complex ventral hernias.  Underwent emergent laparotomy with SB resection, lysis of adhesions and wound vac.  Remained on vent and pressors post op.  Hospital course complicated by respiratory arrest 6/13, multiple reintubations, intermittent vasopressor needs, AKI on CVVHD.     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, HTN, HLD, DM type II  Significant Hospital Events   6/07 Admit, to OR 6/08 transfuse FFP, on multiple pressors 6/09 To OR for wash out, start CRRT 6/13 Acute respiratory arrest overnight, off CRRT, on pressors, limit sedation 6/14 OR for bowel anastomosis and closure of abdominal wall 6/15 Changing volume goal on CRRT to -100 cc an hour. Weaned 7 hours PSV   6/16 Encephalopathic. Hypertensive, adding labetalol.  IV TNA started 6/18 Neuro status improved.  Required brief vasoactive drips.  Net negative fluid volume status.PSV 6/19 Extubated 6/20 Reintubated for altered mentation, head CT negative  6/23 Febrile overnight, Blood Cx.  6/25 L IJ, subclavian, axillary  + DVT. Heparin initiated. 6/26  CT ABD/pelvis positive for free air and ascites. Back to OR  for ex lap of perforated viscous, resection of previous anastomosis, application of wound vac. 1U RBCs intra op.   6/28 Back on pressors with hgb <7 / sedated on fent drip, PRBC 7/01 Less sedation, following commands, abd open with VAC, on CVVHD (even) 7/03 OR, unable to close but bowel "looks some better", hematoma in abd, on CVVHD 7/06 OR for laparotomy, washout, SB anastomosis  7/14 Febrile over night to 102.3,  Hypotensive 500 cc bolus and albumin, CVVHD d/c 7/15 no acute changes.  7/16 Hypotensive, anemia - 2 RBCs, fluid, and NE started 7/17: Febrile.  Off pressors.  Critical care team taking vasoactive drips off from medicine profile.  Nothing further to offer at this point.  Family still not on board with transition to comfort however agreeable to no escalation.  We will not add back therapies that do not help her 7/17: Family adamant about adding back norepinephrine.  This is in spite of strong recommendations against this from critical care service. 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    CT Chest 6/20 >> small bilateral pleural effusions with compressive atelectasis, anterior R 4-7 rib fractures CT ABD/Pelvis 6/20 >> small bowel surgical changes, mild circumferential wall thickening of scattered small bowel loops, small to moderate ascites LUE Duplex 6/24 >> DVT L IJ, subclavian, axillary, SVT L cephalic  CT ABD/Pelvis 3/61 >> large volume ascites with intraperitoneal free air   Micro Data:  COVID 6/07 >> negative Tracheal  aspirate 6/20 >> E Coli sensitive to zosyn BCx2 6/23 >> negative BCx2  6/26 >> negative   Antimicrobials:  Zosyn 6/07 >> 6/17 Diflucan 6/07 >> 6/10 Cefepime 6/21 >>6/26 Vanco 6/21 >> 6/25 Anidulafungin 6/26 >> 7/06 Zosyn 6/26 >>7/10  Interim history/subjective:  Still febrile.  Still on pressors.  No changes otherwise.   Objective   Blood pressure (Abnormal) 132/47, pulse 95, temperature (Abnormal) 97.2 F (36.2 C), temperature source Axillary, resp. rate 18, height 5\' 4"  (1.626 m), weight 65.1 kg, SpO2 99 %.    Vent Mode: PRVC FiO2 (%):  [30 %] 30 % Set Rate:  [18 bmp] 18 bmp Vt Set:  [430 mL] 430 mL PEEP:  [5 cmH20] 5 cmH20 Plateau Pressure:  [19 cmH20-25 cmH20] 22 cmH20   Intake/Output Summary (Last 24 hours) at 01/03/2019 9201 Last data filed at 01/03/2019 0071 Gross per 24 hour  Intake 1397.86 ml  Output 2750 ml  Net -1352.14 ml   Filed Weights   12/24/18 0500 12/25/18 0500 12/27/18 0500  Weight: 64.3 kg 65 kg 65.1 kg   Physical Exam:  General this is a chronically critically ill appearing 63 year old female who is sedated on mechanical ventilator HEENT normocephalic atraumatic orally intubated no JVD mucous membranes are moist Pulmonary: Clear to auscultation diminished in the bases Cardiac: Regular rate and rhythm Abdomen: Wound VAC dressing with purulent feculent appearing output.  Bowel sounds absent.  Drains intact GU: Concentrated yellow urine Neuro: Grimaces to noxious stimulus moves all extremities no focal deficits Extremities: Warm and dry palpable pulses no significant edema.  Resolved Hospital Problem list     Assessment & Plan:   Ventilator dependence w/ failure to wean  in setting of prolonged critical illness, severe protein calorie malnutrition, metabolic encephalopathy and on-going peritonitis -She is not a candidate for tracheostomy Plan: Continue full ventilator support not a candidate for tracheostomy VAP bundle   Abd peritonitis 2/2 anastomotic leak with recurrent septic shock -This is ongoing and not amendable to surgery  -Off antibiotics  -Still with significant leukocytosis -Ongoing fevers; now on pressprs plan Continue wound care  No antibiotics  IV Tylenol  Norepinephrine At 10 mics  Need ethics consult, we will be happy to be part of that  discussion   Acute renal failure  -Serum creatinine climbing but continues to make urine. -No longer has dialysis access -No longer a dialysis candidate Plan Follow UOP  Anemia of critical illness, hemoglobin has dropped down to 6 -Received 2 units of blood Plan No further labs  Severe Protein Malnutrition -We are not able to use her GI tract Plan: Cont TPN; NPO     Best practice:  Diet: TPN DVT prophylaxis: SCDs GI prophylaxis: PPI Mobility: Bedrest Code Status: DO NOT RESUSCITATE Disposition: This is a slowly dying 63 year old black female currently ventilator dependent in the setting of persistent peritonitis.  She is no longer surgical candidate.  Not a candidate for dialysis.  She will not survive this illness.  We are continuing supportive therapy at the absolute insistence of the family however it is the critical care opinion that we have are absolutely providing futile care  Erick Colace ACNP-BC [place] Pager # [phone] OR # [phone] if no answer

## 2019-01-04 MED ORDER — TRAVASOL 10 % IV SOLN
INTRAVENOUS | Status: AC
  Administered 2019-01-04: 17:00:00 via INTRAVENOUS
  Filled 2019-01-04: qty 480

## 2019-01-04 NOTE — Progress Notes (Signed)
Daily Progress Note   Patient Name: Carmen Stone       Date: 01/04/2019 DOB: March 09, 1956  Age: 63 y.o. MRN#: [id number] Attending Physician: Nolon Nations, MD Primary Care Physician: Jettie Booze, NP Admit Date: 11/17/2018  Reason for Consultation/Follow-up: Establishing goals of care  Subjective: Carmen. Stone is resting in bed, she appears chronically ill, doesn't arouse or engage.    A family meeting was held today, along side members from Audiological scientist participating. This meeting was attended by the undersigned as well as PCCM and spiritual care, along with the patient's daughter Carmen Stone and the  Patient's brother Carmen Stone. See below  Length of Stay: 44  Current Medications: Scheduled Meds:  . chlorhexidine gluconate (MEDLINE KIT)  15 mL Mouth Rinse BID  . Chlorhexidine Gluconate Cloth  6 each Topical Daily  . dorzolamide-timolol  1 drop Both Eyes BID  . mouth rinse  15 mL Mouth Rinse 10 times per day  . pantoprazole (PROTONIX) IV  40 mg Intravenous Q24H    Continuous Infusions: . sodium chloride Stopped (12/25/18 0957)  . fentaNYL infusion INTRAVENOUS 300 mcg/hr (01/04/19 1610)  . norepinephrine (LEVOPHED) Adult infusion 9 mcg/min (01/04/19 1610)  . TPN ADULT (ION) 40 mL/hr at 01/04/19 1610  . TPN ADULT (ION)      PRN Meds: sodium chloride, acetaminophen, albuterol, fentaNYL, fentaNYL, hydrALAZINE, metoprolol tartrate, midazolam, sodium chloride flush  Physical Exam         General: Intubated.  Heart: Regular rate and rhythm.  Lungs: Good air movement on vent. Ext: No significant edema Skin: Warm and dry  Vital Signs: BP (!) 130/43   Pulse 75   Temp (!) 97.3 F (36.3 C) (Axillary)   Resp 18   Ht '5\' 4"'  (1.626 m)   Wt 67.5 kg   SpO2 100%   BMI 25.54  kg/m  SpO2: SpO2: 100 % O2 Device: O2 Device: Ventilator O2 Flow Rate: O2 Flow Rate (L/min): 4 L/min  Intake/output summary:   Intake/Output Summary (Last 24 hours) at 01/04/2019 1719 Last data filed at 01/04/2019 1610 Gross per 24 hour  Intake 2928.2 ml  Output 3125 ml  Net -196.8 ml   LBM: Last BM Date: ([place] pouch) Baseline Weight: Weight: 74.8 kg Most recent weight: Weight: 67.5 kg       Palliative  Assessment/Data:    Flowsheet Rows     Most Recent Value  Intake Tab  Referral Department  Surgery  Unit at Time of Referral  ICU  Palliative Care Primary Diagnosis  Sepsis/Infectious Disease  Date Notified  12/07/2018  Palliative Care Type  New Palliative care  Reason for referral  Clarify Goals of Care  Date of Admission  11/24/2018  Date first seen by Palliative Care  12/14/18  # of days Palliative referral response time  1 Day(s)  # of days IP prior to Palliative referral  22  Clinical Assessment  Palliative Performance Scale Score  10%  Psychosocial & Spiritual Assessment  Palliative Care Outcomes  Palliative Care Outcomes  Clarified goals of care      Patient Active Problem List   Diagnosis Date Noted  . Dyspnea   . Palliative care by specialist   . Goals of care, counseling/discussion   . Acute respiratory failure with hypoxia and hypercapnia ([place]) 12/12/2018  . Toxic metabolic encephalopathy 87/86/7672  . Thrombocytopenia, acquired ([place]) 12/12/2018  . Small bowel perforation ([place])   . Pressure injury of skin 11/29/2018  . Encounter for central line placement   . Septic shock ([place])   . AKI (acute kidney injury) ([place])   . S/P dialysis catheter insertion ([place])   . 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 Perforated viscus 12/09/2018  . Port-A-Cath in place 08/03/2017  . Port catheter in place 01/31/2016  . Hypersensitivity reaction 02/08/2014  . Drug induced neutropenia(288.03) 01/30/2014  . Nausea with  vomiting 11/04/2013  . 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 ATN (acute tubular necrosis) 11/02/2013  . Acute respiratory failure ([place]) 11/02/2013  . Acute respiratory failure with hypoxia ([place]) 10/25/2013  . Abnormal urinalysis 10/25/2013  . Postoperative anemia due to acute blood loss 10/24/2013  . Breast abscess of female 10/22/2013  . HYPERLIPIDEMIA 07/16/2010  . ATELECTASIS 07/16/2010  . Type 2 diabetes mellitus ([place]) 06/24/2010  . 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 HYPOXEMIA 06/24/2010  . SMALL BOWEL OBSTRUCTION, HX OF 06/24/2010    Palliative Care Assessment & Plan   Patient Profile: 63 y.o. female  with past medical history of  admitted on 11/26/2018 with abdominal pain from perforation and complicated clinical course with multiple surgeries requiring resection of large portion of small bowel, code blue, and multisystem organ failure (EF 20% and vent dependent).  CRRT stopped with no plan to restart.     Assessment: Patient Active Problem List   Diagnosis Date Noted  . Dyspnea   . Palliative care by specialist   . Goals of care, counseling/discussion   . Acute respiratory failure with hypoxia and hypercapnia ([place]) 12/12/2018  . Toxic metabolic encephalopathy 09/47/0962  . Thrombocytopenia, acquired ([place]) 12/12/2018  . Small bowel perforation ([place])   . Pressure injury of skin 11/29/2018  . Encounter for central line placement   . Septic shock ([place])   . AKI (acute kidney injury) ([place])   . S/P dialysis catheter insertion ([place])   . 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 Perforated viscus 12/12/2018  . Port-A-Cath in place 08/03/2017  . Port catheter in place 01/31/2016  . Hypersensitivity reaction 02/08/2014  . Drug induced neutropenia(288.03) 01/30/2014  . Nausea with vomiting 11/04/2013  . 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 ATN (acute tubular necrosis) 11/02/2013  . Acute respiratory failure ([place])  11/02/2013  . Acute respiratory failure with hypoxia ([place]) 10/25/2013  . Abnormal urinalysis  10/25/2013  . Postoperative anemia due to acute blood loss 10/24/2013  . Breast abscess of female 10/22/2013  . HYPERLIPIDEMIA 07/16/2010  . ATELECTASIS 07/16/2010  . Type 2 diabetes mellitus ([place]) 06/24/2010  . 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 HYPOXEMIA 06/24/2010  . SMALL BOWEL OBSTRUCTION, HX OF 06/24/2010    Recommendations/Plan: -A family meeting was held along side members from Audiological scientist participating.   We met with the patient's brother and daughter. After initial introductions, discussions were held about the patient's current medical condition, her prognosis and overall goals of care.  Brief life review was performed. 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 Theirs is a family of faith, hope and prayer and with a belief in miracles.   A detailed discussion about septic shock, abdominal pathology, prolonged state of ventilatory support was discussed.   We discussed about goals, wishes and values important to the patient and family as a unit.   Signs and symptoms of the dying process were discussed. Discussed about time limited trial of current interventions and possibility of comfort measures only/ no further escalation of care should the patient have further decline and decompensation.   The patient's family stated that they are well aware of how sick the patient is, and that it doesn't seem possible that the patient will survive this hospitalization in any meaningful way. Additionally, they understand that the patient cannot have prolonged mechanical ventilation with  A ETT and tracheostomy is not deemed appropriate and family agrees.   PMT to continue to support holistically. Family expressed gratitude towards staff for the care Carmen  Jarvie is receiving currently. All of their questions were addressed to the best of our abilities.     Code Status: DNR  Prognosis:   Poor  Discharge Planning:  Anticipated hospital death.   Care plan was discussed with PCCM, ethics, RN, and patient's brother and sister.    Thank you for allowing the Palliative Medicine Team to assist in the care of this patient.   Time In: 1430 Time Out: 1600 Total Time 90 Prolonged Time Billed YES       Greater than 50%  of this time was spent counseling and coordinating care related to the above assessment and plan.  Loistine Chance, MD [place] Team [phone]  Please contact Palliative Medicine Team phone at [phone] for questions and concerns.

## 2019-01-04 NOTE — Progress Notes (Signed)
Patients temperature 102.2. Cooling blanket applied and set. Will monitor.

## 2019-01-04 NOTE — Progress Notes (Signed)
NAME:  Carmen Stone, MRN:  [id number], DOB:  11-Oct-1955, LOS: 49 ADMISSION DATE:  12/12/2018, CONSULTATION DATE:  11/21/2018 REFERRING MD:  Dr. Ninfa Linden, Surgery, CHIEF COMPLAINT:  Abdominal pain   Brief History   63 y/o female presented to ER 11/16/2018 with nausea, vomiting, abdominal pain.  CT abd/pelvis showed focal perforated loop of SB with free air with multiple complex ventral hernias.  Underwent emergent laparotomy with SB resection, lysis of adhesions and wound vac.  Remained on vent and pressors post op.  Hospital course complicated by respiratory arrest 6/13, multiple reintubations, intermittent vasopressor needs, AKI on CVVHD.     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, HTN, HLD, DM type II  Significant Hospital Events   6/07 Admit, to OR 6/08 transfuse FFP, on multiple pressors 6/09 To OR for wash out, start CRRT 6/13 Acute respiratory arrest overnight, off CRRT, on pressors, limit sedation 6/14 OR for bowel anastomosis and closure of abdominal wall 6/15 Changing volume goal on CRRT to -100 cc an hour. Weaned 7 hours PSV   6/16 Encephalopathic. Hypertensive, adding labetalol.  IV TNA started 6/18 Neuro status improved.  Required brief vasoactive drips.  Net negative fluid volume status.PSV 6/19 Extubated 6/20 Reintubated for altered mentation, head CT negative  6/23 Febrile overnight, Blood Cx.  6/25 L IJ, subclavian, axillary  + DVT. Heparin initiated. 6/26  CT ABD/pelvis positive for free air and ascites. Back to OR  for ex lap of perforated viscous, resection of previous anastomosis, application of wound vac. 1U RBCs intra op.   6/28 Back on pressors with hgb <7 / sedated on fent drip, PRBC 7/01 Less sedation, following commands, abd open with VAC, on CVVHD (even) 7/03 OR, unable to close but bowel "looks some better", hematoma in abd, on CVVHD 7/06 OR for laparotomy, washout, SB anastomosis  7/14 Febrile over night to 102.3,  Hypotensive 500 cc bolus and albumin, CVVHD d/c 7/15 no acute changes.  7/16 Hypotensive, anemia - 2 RBCs, fluid, and NE started 7/17: Febrile.  Off pressors.  Critical care team taking vasoactive drips off from medicine profile.  Nothing further to offer at this point.  Family still not on board with transition to comfort however agreeable to no escalation.  We will not add back therapies that do not help her 7/17: Family adamant about adding back norepinephrine.  This is in spite of strong recommendations against this from critical care service. 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    CT Chest 6/20 >> small bilateral pleural effusions with compressive atelectasis, anterior R 4-7 rib fractures CT ABD/Pelvis 6/20 >> small bowel surgical changes, mild circumferential wall thickening of scattered small bowel loops, small to moderate ascites LUE Duplex 6/24 >> DVT L IJ, subclavian, axillary, SVT L cephalic  CT ABD/Pelvis 0/93 >> large volume ascites with intraperitoneal free air  Micro Data:  COVID 6/07 >> negative Tracheal aspirate 6/20 >> E Coli sensitive to zosyn BCx2 6/23 >> negative BCx2  6/26 >> negative   Antimicrobials:  Zosyn 6/07 >> 6/17 Diflucan 6/07 >> 6/10 Cefepime 6/21 >>6/26 Vanco 6/21 >> 6/25 Anidulafungin 6/26 >> 7/06 Zosyn 6/26 >>7/10  Interim history/subjective:  Still febrile.   Still on pressors.  No changes otherwise.  Objective   Blood pressure (Abnormal) 95/31, pulse 95, temperature 98.4 F (36.9 C), temperature source Axillary, resp. rate 12, height 5\' 4"  (1.626 m), weight 67.5 kg, SpO2 100 %.    Vent Mode: PRVC FiO2 (%):  [30 %] 30 % Set Rate:  [18 bmp] 18 bmp Vt Set:  [430 mL] 430 mL PEEP:  [5 cmH20] 5 cmH20 Plateau Pressure:  [19 cmH20-21 cmH20] 20 cmH20   Intake/Output Summary (Last 24 hours) at 01/04/2019 0859 Last data filed at 01/04/2019 0840 Gross per 24 hour  Intake 3040.24 ml  Output 2675 ml  Net 365.24 ml   Filed Weights   12/25/18 0500 12/27/18 0500 01/04/19 2637  Weight: 65 kg 65.1 kg 67.5 kg   Physical Exam:  General this is a critically ill, slowly dying 63 year old black woman on mechanical ventilation HENT NCAT no JVD MMM pulm decreased bases, remains on full vent support Card RRR abd wound w/ purulent/feculent drainage gu cnc yellow  Neuro moves ext    Resolved Hospital Problem list     Assessment & Plan:   Ventilator dependence w/ failure to wean  in setting of prolonged critical illness, severe protein calorie malnutrition, metabolic encephalopathy and on-going peritonitis Abd peritonitis 2/2 anastomotic leak with recurrent septic shock Acute renal failure  Anemia of critical illness Severe Protein Malnutrition  Discussion She has un-survivable  Critical illness due to on-going peritonitis, w/ on-going septic shock and multiple organ failure. This is further complicated by severe protein calorie malnutrition. She is NOT a candidate for further surgery, dialysis, trach or new invasive therapy. It is our opinion that we have crossed over to futile care and that we are doing more to her than for her. We will be meeting w/ family again today. This will be with ethics team.   Plan Cont current level of support Cont analgesia and fever management Cont wound care No abx No further titration of pressors. (would recommend  that if we can get her off from them we NOT resume)  Fully support one-way-extubation and transition to comfort but doubt that family will agree.      Best practice:  Diet: TPN DVT prophylaxis: SCDs GI prophylaxis: PPI Mobility: Bedrest Code Status: DO NOT RESUSCITATE Disposition ethic meeting this afternoon  Erick Colace ACNP-BC [place] Pager # [phone] OR # [phone] if no answer

## 2019-01-04 NOTE — Ethics Note (Signed)
Met this afternoon with  Domenic Schwab and [name] from [place] (brother) and Lajuana Patchell (daughter) from family Marni Griffon from CCM Dr. Rowe Pavy from [place].  Clinical situation: Patient was active and in reasonably good health prior to admission 2 months ago.  She has had a series of surgeries for bowel infarction and peritonitis.  She has an ongoing bowel leak, intraabdominal infection and an open surgical wound.  Currently, she is receiving vent support and vasopressors.  The surgical team states there are no further surgical treatment options.  The full medical team believes that short of a miracle, the patient is dying.  Their current supportive measures are only prolonging the inevitable.  Family perception and beliefs. The family is appreciative of the efforts of the care team.  They describe Ms Boot as a Nurse, adult.  They also have a strong faith and are praying for a miracle.  They have been heartened by small signs of improvement (e.g. the kidneys have improved and she no longer needs dialysis.  She also seems more responsive than a week ago.)  They strongly want to continue current care.    Outcome of the meeting.  Everyone in the meeting was supportive and professional.  All agreed to not excalating current treatments, which was appreciated by the family.  The care team expressed the strong belief that it was only a matter of time before Ms Barcia decompensated again.  The team expressed some patience to let the situation play out and declare itself.  The family also agreed to pray about the information they had heard today and expressed a willingness to listen for God's will about what to do in this situation.  Left unanswered: The clinical team did not express a desire to remove any existing treatment.  Should it come to that juncture, they would need to follow the careful process outlined in the [place].

## 2019-01-04 NOTE — Progress Notes (Addendum)
Chaplain rounding on patient for continued support in this difficult case.  Family not in room at time of chaplain rounding.   Consulted with ICU leadership around team needs.   Available for support around family meeting this afternoon.

## 2019-01-04 NOTE — Progress Notes (Signed)
[facility] Progress Note  15 Days Post-Op  Subjective: CC-  Remains on the vent, febrile. On levo. Palliative team arranging meeting for family with ethics team.  Objective: Vital signs in last 24 hours: Temp:  [97.2 F (36.2 C)-102.2 F (39 C)] 101.4 F (38.6 C) (07/21 0400) Pulse Rate:  [81-114] 99 (07/21 0700) Resp:  [11-24] 23 (07/21 0700) BP: (82-132)/(33-79) 117/79 (07/21 0700) SpO2:  [98 %-100 %] 100 % (07/21 0700) FiO2 (%):  [30 %] 30 % (07/21 0500) Weight:  [67.5 kg] 67.5 kg (07/21 0609) Last BM Date: 01/03/19(through eakin pouch)  Intake/Output from previous day: 07/20 0701 - 07/21 0700 In: 2755 [I.V.:2755] Out: 2525 [Urine:1405; Emesis/NG output:170; Drains:950] Intake/Output this shift: No intake/output data recorded.  PE: Gen:On the vent Pulm:coarse breath soundsbilaterally, mechanically ventilated Cardio: tachy Abd: Soft,grimaces in pain to palpation,[place]ak[place] pouchto suction with good seal/feculent drainage Ext:1-2+ edema BLE/BUE Skin: warm and dry Neuro: does not open eyes for me this morning  Lab Results:  No results for input(s): WBC, HGB, HCT, PLT in the last 72 hours. BMET No results for input(s): NA, K, CL, CO2, GLUCOSE, BUN, CREATININE, CALCIUM in the last 72 hours. PT/INR No results for input(s): LABPROT, INR in the last 72 hours. CMP     Component Value Date/Time   NA 143 12/30/2018 0406   NA 138 06/01/2017 0859   K 3.0 (L) 12/30/2018 0406   K 4.2 06/01/2017 0859   CL 97 (L) 12/30/2018 0406   CO2 32 12/30/2018 0406   CO2 24 06/01/2017 0859   GLUCOSE 382 (H) 12/30/2018 0406   GLUCOSE 118 06/01/2017 0859   BUN 84 (H) 12/30/2018 0406   BUN 15.2 06/01/2017 0859   CREATININE 3.33 (H) 12/30/2018 0406   CREATININE 0.79 06/02/2018 0856   CREATININE 0.7 06/01/2017 0859   CALCIUM 7.7 (L) 12/30/2018 0406   CALCIUM 9.0 06/01/2017 0859   PROT 6.7 12/30/2018 0406   PROT 7.0 06/01/2017 0859   ALBUMIN 2.0 (L)  12/30/2018 0406   ALBUMIN 3.7 06/01/2017 0859   AST 24 12/30/2018 0406   AST 13 (L) 06/02/2018 0856   AST 14 06/01/2017 0859   ALT 23 12/30/2018 0406   ALT 16 06/02/2018 0856   ALT 12 06/01/2017 0859   ALKPHOS 199 (H) 12/30/2018 0406   ALKPHOS 97 06/01/2017 0859   BILITOT 0.4 12/30/2018 0406   BILITOT 0.3 06/02/2018 0856   BILITOT 0.22 06/01/2017 0859   GFRNONAA 14 (L) 12/30/2018 0406   GFRNONAA >60 06/02/2018 0856   GFRAA 16 (L) 12/30/2018 0406   GFRAA >60 06/02/2018 0856   Lipase     Component Value Date/Time   LIPASE 24 11/22/2018 0355       Studies/Results: No results found.  Anti-infectives: Anti-infectives (From admission, onward)   Start     Dose/Rate Route Frequency Ordered Stop   11/26/2018 1030  clindamycin (CLEOCIN) 900 mg, gentamicin (GARAMYCIN) 240 mg in sodium chloride 0.9 % 1,000 mL for intraperitoneal lavage      Irrigation To Surgery 11/20/2018 1026 12/03/2018 1042   12/11/18 1500  anidulafungin (ERAXIS) 100 mg in sodium chloride 0.9 % 100 mL IVPB  Status:  Discontinued     100 mg 78 mL/hr over 100 Minutes Intravenous Every 24 hours 11/22/2018 1007 12/15/2018 1103   12/11/18 0600  piperacillin-tazobactam (ZOSYN) IVPB 3.375 g  Status:  Discontinued     3.375 g 100 mL/hr over 30 Minutes Intravenous Every 6 hours 12/11/18 0317 12/24/18 0737   12/07/2018  1800  piperacillin-tazobactam (ZOSYN) IVPB 2.25 g  Status:  Discontinued     2.25 g 100 mL/hr over 30 Minutes Intravenous Every 6 hours 12/09/2018 1413 12/11/18 0317   12/09/2018 1100  anidulafungin (ERAXIS) 200 mg in sodium chloride 0.9 % 200 mL IVPB     200 mg 78 mL/hr over 200 Minutes Intravenous  Once 12/05/2018 1007 11/22/2018 1456   12/11/2018 1100  piperacillin-tazobactam (ZOSYN) IVPB 3.375 g     3.375 g 100 mL/hr over 30 Minutes Intravenous  Once 12/12/2018 1027 11/29/2018 1245   12/08/18 1800  vancomycin (VANCOCIN) IVPB 750 mg/150 ml premix  Status:  Discontinued     750 mg 150 mL/hr over 60 Minutes Intravenous Every  48 hours 12/07/18 0822 12/07/18 0903   12/08/18 1000  ceFEPIme (MAXIPIME) 2 g in sodium chloride 0.9 % 100 mL IVPB  Status:  Discontinued     2 g 200 mL/hr over 30 Minutes Intravenous Every 24 hours 12/07/18 0822 12/07/18 0903   12/07/18 2200  ceFEPIme (MAXIPIME) 2 g in sodium chloride 0.9 % 100 mL IVPB  Status:  Discontinued     2 g 200 mL/hr over 30 Minutes Intravenous Every 12 hours 12/07/18 0903 11/22/2018 1027   12/07/18 1800  vancomycin (VANCOCIN) IVPB 750 mg/150 ml premix  Status:  Discontinued     750 mg 150 mL/hr over 60 Minutes Intravenous Every 24 hours 12/07/18 0903 12/09/18 1040   12/06/18 1800  vancomycin (VANCOCIN) IVPB 750 mg/150 ml premix  Status:  Discontinued     750 mg 150 mL/hr over 60 Minutes Intravenous Every 24 hours 12/05/18 1652 12/07/18 0822   12/05/18 1800  ceFEPIme (MAXIPIME) 2 g in sodium chloride 0.9 % 100 mL IVPB  Status:  Discontinued     2 g 200 mL/hr over 30 Minutes Intravenous Every 12 hours 12/05/18 1644 12/07/18 0822   12/05/18 1700  vancomycin (VANCOCIN) 1,250 mg in sodium chloride 0.9 % 250 mL IVPB     1,250 mg 166.7 mL/hr over 90 Minutes Intravenous  Once 12/05/18 1652 12/05/18 2019   12/04/2018 1800  fluconazole (DIFLUCAN) IVPB 200 mg  Status:  Discontinued     200 mg 100 mL/hr over 60 Minutes Intravenous Every 24 hours 11/29/2018 0748 11/24/2018 0847   11/30/2018 1800  fluconazole (DIFLUCAN) IVPB 400 mg  Status:  Discontinued     400 mg 100 mL/hr over 120 Minutes Intravenous Every 24 hours 12/08/2018 0847 11/20/2018 0856   11/26/2018 1400  piperacillin-tazobactam (ZOSYN) IVPB 2.25 g  Status:  Discontinued     2.25 g 100 mL/hr over 30 Minutes Intravenous Every 6 hours 11/18/2018 0748 12/01/18 1104   11/29/2018 0600  cefoTEtan (CEFOTAN) 2 g in sodium chloride 0.9 % 100 mL IVPB     2 g 200 mL/hr over 30 Minutes Intravenous On call to O.R. 11/22/18 1300 12/02/2018 0624   11/22/18 1800  fluconazole (DIFLUCAN) IVPB 400 mg  Status:  Discontinued     400 mg 100 mL/hr  over 120 Minutes Intravenous Every 24 hours 11/24/2018 1528 11/19/2018 0748   11/22/18 1800  vancomycin (VANCOCIN) 1,500 mg in sodium chloride 0.9 % 500 mL IVPB  Status:  Discontinued     1,500 mg 250 mL/hr over 120 Minutes Intravenous Every 24 hours 11/22/18 0358 11/22/18 0750   11/22/18 0115  vancomycin (VANCOCIN) 1,500 mg in sodium chloride 0.9 % 500 mL IVPB     1,500 mg 250 mL/hr over 120 Minutes Intravenous  Once 11/22/18 0101 11/22/18 0319  12/14/2018 1630  fluconazole (DIFLUCAN) IVPB 800 mg     800 mg 100 mL/hr over 240 Minutes Intravenous  Once 11/26/2018 1527 12/03/2018 2013   11/22/2018 1600  piperacillin-tazobactam (ZOSYN) IVPB 3.375 g  Status:  Discontinued     3.375 g 12.5 mL/hr over 240 Minutes Intravenous Every 8 hours 12/12/2018 1522 12/14/2018 0748   11/17/2018 0545  piperacillin-tazobactam (ZOSYN) IVPB 3.375 g     3.375 g 100 mL/hr over 30 Minutes Intravenous  Once 12/02/2018 0530 12/09/2018 0626   11/24/2018 0530  piperacillin-tazobactam (ZOSYN) IVPB 4.5 g  Status:  Discontinued     4.5 g 200 mL/hr over 30 Minutes Intravenous  Once 12/08/2018 0517 12/02/2018 0529       Assessment/Plan rforatedsmall bowel with closed-loop obstruction  1.Exploratory laparotomy small bowel resection, lysis of adhesions, 1.5 hours, placement of wound VAC 12/04/2018 Dr. Coralie Keens 2. Exploratory laparotomy, washout and placement of wound VAC 12/01/2018 Dr. Marcello Moores Cornett 3. Exploratory laparotomy with anastomosis of small bowel and closure of abdominal wall 11/19/2018 Dr. Marcello Moores Cornett 4.Exploratory laparotomy, resection of previous anastomosis, wound VAC placement 12/12/2018 Dr. Autumn Messing 5. Reopening of recent laparotomy, small bowel resection, placement of wound VAC 12/06/2018, Dr. Dorris Fetch Byerly(FINDINGS:Pus throughout abdomen, though minimal succus. Bilious rind over intestines. Small hole on distal segment of small bowel with small amount of bile)  6.EXPLORATORY LAPAROTOMY,WASH OUT OF  ABDOMEN APPLICATION OF WOUND ZOX(WRUEAVW)0/02/8118 Dr. Ninfa Linden 7.Exploratory laparotomy with abdominal washout,Small bowel anastomosis07/21/2020 Dr. Dema Severin Has small bowel anastomosisleak There are no further surgical options  Respiratory failure - on vent Acute renal failure - followed by Dr. Mickel Crow. CRRT stopped 7/13 as kidney function improved and patient making urine DM Severe malnutrition - Pre albumn - 9.3 on 01/11/2019 On TPN Anemia Hgb6 (7/16) Code status DNR  FEN: N.p.o./TPN JY:NWGN currently FAO:ZHYQ, heparin held due to anemia Follow-up: To be determined POC: Mattier,George Brother [phone]  [phone]  Matier,Vickie    [phone]   Plan:Poor prognosis. Palliative team arranging meeting for family with ethics committee.   LOS: 60 days    Wellington Hampshire , [facility] 01/04/2019, 7:30 AM Pager: [phone] Mon-Thurs 7:00 am-4:30 pm Fri 7:00 am -11:30 AM Sat-Sun 7:00 am-11:30 am

## 2019-01-04 NOTE — Progress Notes (Addendum)
Met with brother, Iona Beard, and daughter, [name], at bedside prior to care team meeting.     SUMMARY:   Iona Beard expressed belief that a miracle can occur - consistent with prior interactions.  He feels that Ms. Cunanan is "getting better" - noting that she is "leaking less than when she came out of surgery" and notes her continued responsiveness.  He feels that "with time - she will continue to heal."     Prior to meeting, he states "I do not know if this will be possible after today."   Iona Beard wished to pray about the meeting with care team / ethics.  Chaplain shared prayers for clarity, ability to hear one another, love and honor for patient.   [name] joined consult during prayer.     They request that spiritual care be present in meeting with care team.   __________  016:20  Chaplain present for meeting with ethics committee, critical care, and palliative.   Family expresses appreciation for meeting and voiced feeling their concerns were heard.       Iona Beard and [name] have contact information for lead clinical chaplain and understand how to request page to 24 hour pager for spiritual care support.   They request evening chaplain come after 7 for prayers.  Will make referral.    Jerene Pitch, MDiv, [facility]

## 2019-01-05 MED ORDER — TRAVASOL 10 % IV SOLN
INTRAVENOUS | Status: AC
  Administered 2019-01-05: 18:00:00 via INTRAVENOUS
  Filled 2019-01-05: qty 480

## 2019-01-05 NOTE — Progress Notes (Signed)
Initial Nutrition Assessment  DOCUMENTATION CODES:   Not applicable  INTERVENTION:  - continue TPN per Pharmacy.   NUTRITION DIAGNOSIS:   Inadequate oral intake related to inability to eat as evidenced by NPO status. -ongoing  GOAL:   Patient will meet greater than or equal to 90% of their needs -unmet with current TPN rate  MONITOR:   Vent status, Skin, I & O's, Other (Comment)(TPN regimen)  ASSESSMENT:   63 year old female with past medical history of type 2 DM, hyperlipidemia, HTN, inflammatory R breast cancer s/p radiation in 2015. Patient presented to the ED on 6/7 with severe abdominal pain, N/V. CT abdomen/pelvis which showed perforated small bowel with large amount of free air and multiple incarcerated incisional hernias.   Significant Events: 6/7- admission; ex lap, small bowel resection, LOAs, and placement of wound vac; NGT placement 6/9- CRRT initiation; return to OR for re-opening of open abdomen with washout 6/12- CRRT stopped 6/14- bowel anastomosis and closure of abdominal wall; CRRT re-started 6/16- initiation of TPN 6/19- extubation 6/20- re-intubation for AMS 6/21- CRRT stopped for break 6/22- initiation of trickle TF 6/23- CRRT re-started 6/24- begin slowly advancing TF to goal rate 6/25- TF stopped and NGT to LIS d/t abdominal distention 6/26- CT abdomen/pelvis (+) for free air; return to OR for ex lap, resection of previous anastomosis, application of wound vac 6/29- return to OR (possible abd closure today) 6/30- Palliative Care consulted 7/3- return to OR for ex lap, wash out of abdomen, placement of wound vac (open abd) 7/6- return to OR for washout, possible bowel anastomosis, and possible ostomy 7/13- now making urine; CRRT stopped 7/14- TPN decreased from goal rate of 75 ml/hr to 40 ml/hr 7/21- family meeting with clinical staff and Ethics Committee representative     Patient remains intubated with NGT to LIS with 50 ml output over night.  Some liquid stool output this AM. Spoke with RN who reports no changes this shift. Patient is receiving custom TPN, no lytes except 50 mEq/L Na, @ 40 ml/hr. No escalation of care, but continue current measures, not transitioning to comfort care at this time.    Patient is currently intubated on ventilator support MV: 7.1 L/min Temp (24hrs), Avg:98.5 F (36.9 C), Min:96.5 F (35.8 C), Max:101.5 F (38.6 C)   Labs; no CMP since 7/15 and no CBGs since 7/19. Medications reviewed.    Diet Order:   Diet Order    None      EDUCATION NEEDS:   No education needs have been identified at this time  Skin:  Skin Assessment: Skin Integrity Issues: Skin Integrity Issues:: DTI, Stage II, Incisions DTI: sacrum Stage II: L ear Wound Vac: abdomen Incisions: abdomen (7/6)  Last BM:  7/22 (220 ml via pouch)  Height:   Ht Readings from Last 1 Encounters:  12/26/18 5\' 4"  (1.626 m)    Weight:   Wt Readings from Last 1 Encounters:  01/04/19 67.5 kg    Ideal Body Weight:  54.5 kg  BMI:  Body mass index is 25.54 kg/m.  Estimated Nutritional Needs:   Kcal:  1423 kcal  Protein:  130-143 grams  Fluid:  >/= 1.8 L/day     Jarome Matin, MS, RD, LDN, [facility] Inpatient Clinical Dietitian Pager # [phone] After hours/weekend pager # [phone]

## 2019-01-05 NOTE — Progress Notes (Signed)
NAME:  Carmen Stone, MRN:  [id number], DOB:  Nov 30, 1955, LOS: 66 ADMISSION DATE:  12/02/2018, CONSULTATION DATE:  11/21/2018 REFERRING MD:  Dr. Ninfa Linden, Surgery, CHIEF COMPLAINT:  Abdominal pain   Brief History   63 y/o female presented to ER 11/26/2018 with nausea, vomiting, abdominal pain.  CT abd/pelvis showed focal perforated loop of SB with free air with multiple complex ventral hernias.  Underwent emergent laparotomy with SB resection, lysis of adhesions and wound vac.  Remained on vent and pressors post op.  Hospital course complicated by respiratory arrest 6/13, multiple reintubations, intermittent vasopressor needs, AKI on CVVHD.     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, HTN, HLD, DM type II  Significant Hospital Events   6/07 Admit, to OR 6/08 transfuse FFP, on multiple pressors 6/09 To OR for wash out, start CRRT 6/13 Acute respiratory arrest overnight, off CRRT, on pressors, limit sedation 6/14 OR for bowel anastomosis and closure of abdominal wall 6/15 Changing volume goal on CRRT to -100 cc an hour. Weaned 7 hours PSV   6/16 Encephalopathic. Hypertensive, adding labetalol.  IV TNA started 6/18 Neuro status improved.  Required brief vasoactive drips.  Net negative fluid volume status.PSV 6/19 Extubated 6/20 Reintubated for altered mentation, head CT negative  6/23 Febrile overnight, Blood Cx.  6/25 L IJ, subclavian, axillary  + DVT. Heparin initiated. 6/26  CT ABD/pelvis positive for free air and ascites. Back to OR  for ex lap of perforated viscous, resection of previous anastomosis, application of wound vac. 1U RBCs intra op.   6/28 Back on pressors with hgb <7 / sedated on fent drip, PRBC 7/01 Less sedation, following commands, abd open with VAC, on CVVHD (even) 7/03 OR, unable to close but bowel "looks some better", hematoma in abd, on CVVHD 7/06 OR for laparotomy, washout, SB anastomosis  7/14 Febrile over night to 102.3,  Hypotensive 500 cc bolus and albumin, CVVHD d/c 7/15 no acute changes.  7/16 Hypotensive, anemia - 2 RBCs, fluid, and NE started 7/17: Febrile.  Off pressors.  Critical care team taking vasoactive drips off from medicine profile.  Nothing further to offer at this point.  Family still not on board with transition to comfort however agreeable to no escalation.  We will not add back therapies that do not help her 7/17: Family adamant about adding back norepinephrine.  This is in spite of strong recommendations against this from critical care service. 7/17 through 7/20: No other changes other than norepinephrine dependent 7/21 meeting w/ family and ethics   Consults:  Renal 6/09 AKI, acidosis Cardiology 6/09 Takotsubo CM  Palliative care 6/30 Goals of care  Procedures:  Lt radial aline 6/09 >> discontinued ETT 6/07 >> 6/19, 6/20 >> Rt IJ HD 6/09 >> Rt [place] CVL 6/09 >>  Significant Diagnostic Tests:  CT abd/pelvis 6/07 >> focal perforated loop of SB with free air with multiple complex ventral hernias Echo 6/08 >> EF 20%, Takotsubo CM CT head 6/20 >> negative for any acute findings CT abdomen 6/20 >> bilateral effusions, atelectasis left lower lobe, results noted.    CT Chest 6/20 >> small bilateral pleural effusions with compressive atelectasis, anterior R 4-7 rib fractures CT ABD/Pelvis 6/20 >> small bowel surgical changes, mild circumferential wall thickening of scattered small bowel loops, small to moderate ascites LUE Duplex 6/24 >> DVT L IJ, subclavian, axillary, SVT L cephalic  CT ABD/Pelvis 0/17 >> large volume ascites with intraperitoneal free air  Micro Data:  COVID 6/07 >> negative Tracheal aspirate 6/20 >> E Coli sensitive to zosyn BCx2 6/23 >> negative BCx2  6/26 >> negative   Antimicrobials:  Zosyn 6/07 >> 6/17 Diflucan 6/07 >> 6/10 Cefepime 6/21 >>6/26 Vanco 6/21 >> 6/25 Anidulafungin 6/26 >> 7/06 Zosyn 6/26 >>7/10  Interim history/subjective:  No significant  change  Objective   Blood pressure (Abnormal) 99/36, pulse (Abnormal) 105, temperature 98.6 F (37 C), temperature source Axillary, resp. rate 18, height 5\' 4"  (1.626 m), weight 67.5 kg, SpO2 100 %.    Vent Mode: PRVC FiO2 (%):  [30 %] 30 % Set Rate:  [18 bmp] 18 bmp Vt Set:  [430 mL] 430 mL PEEP:  [5 cmH20] 5 cmH20 Plateau Pressure:  [19 cmH20-23 cmH20] 23 cmH20   Intake/Output Summary (Last 24 hours) at 01/05/2019 8938 Last data filed at 01/05/2019 0800 Gross per 24 hour  Intake 2369.49 ml  Output 2195 ml  Net 174.49 ml   Filed Weights   12/25/18 0500 12/27/18 0500 01/04/19 1017  Weight: 65 kg 65.1 kg 67.5 kg   Physical Exam:  General: This is a 63 year old critically ill female who remains on ventilatory support HEENT normocephalic atraumatic orally intubated sclera nonicteric Pulmonary: Clear to auscultation diminished bases Cardiac: Regular rate and rhythm Abdomen: Soft, purulent drainage from wound GU: Concentrated yellow Extremities: Warm and dry Neuro: Follows commands at times but less reactive recently  Resolved Hospital Problem list     Assessment & Plan:   Ventilator dependence w/ failure to wean  in setting of prolonged critical illness, severe protein calorie malnutrition, metabolic encephalopathy and on-going peritonitis Abd peritonitis 2/2 anastomotic leak with recurrent septic shock Acute renal failure  Anemia of critical illness Severe Protein Malnutrition  Discussion No significant change.  Remains ventilator and pressor dependent.  Long discussion with family including that this committee on 7/21.  We agreed to not escalate care beyond current level.  I was very clear with the patient's brother and daughter that I do not think she will survive this illness and she will die as a complication of it.  They remain hopeful for a miracle, however did verbalize an understanding that she is terminally ill at the same time.  We will continue supportive care for  now, I have told them that she is not a candidate for trach and that eventually will need to decide on removing the endotracheal tube however I did not push that decision during our visit.  Plan Continue ventilatory support Continue analgesia and fever for management Continue wound care No escalation of pressors beyond 10 mcg of norepinephrine VAP bundle  I continue to support the following: DO NOT RESUSCITATE, no escalation, if norepinephrine is weaned down that we not re-escalate back up again.  We will continue to provide patient support and family support as well    Best practice:  Diet: TPN DVT prophylaxis: SCDs GI prophylaxis: PPI Mobility: Bedrest Code Status: DO NOT RESUSCITATE Disposition ethic meeting this afternoon  Erick Colace ACNP-BC [place] Pager # [phone] OR # [phone] if no answer

## 2019-01-05 NOTE — Progress Notes (Signed)
[facility] Progress Note  16 Days Post-Op  Subjective: CC-  Febrile and hypotensive this AM. Remains on pressors. Family meeting with palliative/ethics yesterday.  Objective: Vital signs in last 24 hours: Temp:  [96.5 F (35.8 C)-101.5 F (38.6 C)] 98.6 F (37 C) (07/22 0800) Pulse Rate:  [74-110] 105 (07/22 0811) Resp:  [11-25] 18 (07/22 0811) BP: (75-140)/(27-68) 99/36 (07/22 0800) SpO2:  [99 %-100 %] 100 % (07/22 0811) FiO2 (%):  [30 %] 30 % (07/22 0811) Last BM Date: 01/05/19  Intake/Output from previous day: 07/21 0701 - 07/22 0700 In: 2583.1 [I.V.:2583.1] Out: 2345 [Urine:1600; Emesis/NG output:50; Drains:695] Intake/Output this shift: Total I/O In: 107.5 [I.V.:107.5] Out: -   PE: Gen:On the vent Pulm:coarse breath soundsbilaterally, mechanically ventilated Cardio: tachy Abd: Soft,grimaces in pain to palpation,Eakins pouchto suction with good seal/feculent drainage Ext:1+ edema BLE/BUE Skin: warm and dry Neuro: does not open eyes for me this morning or follow any commands  Lab Results:  No results for input(s): WBC, HGB, HCT, PLT in the last 72 hours. BMET No results for input(s): NA, K, CL, CO2, GLUCOSE, BUN, CREATININE, CALCIUM in the last 72 hours. PT/INR No results for input(s): LABPROT, INR in the last 72 hours. CMP     Component Value Date/Time   NA 143 12/30/2018 0406   NA 138 06/01/2017 0859   K 3.0 (L) 12/30/2018 0406   K 4.2 06/01/2017 0859   CL 97 (L) 12/30/2018 0406   CO2 32 12/30/2018 0406   CO2 24 06/01/2017 0859   GLUCOSE 382 (H) 12/30/2018 0406   GLUCOSE 118 06/01/2017 0859   BUN 84 (H) 12/30/2018 0406   BUN 15.2 06/01/2017 0859   CREATININE 3.33 (H) 12/30/2018 0406   CREATININE 0.79 06/02/2018 0856   CREATININE 0.7 06/01/2017 0859   CALCIUM 7.7 (L) 12/30/2018 0406   CALCIUM 9.0 06/01/2017 0859   PROT 6.7 12/30/2018 0406   PROT 7.0 06/01/2017 0859   ALBUMIN 2.0 (L) 12/30/2018 0406   ALBUMIN 3.7  06/01/2017 0859   AST 24 12/30/2018 0406   AST 13 (L) 06/02/2018 0856   AST 14 06/01/2017 0859   ALT 23 12/30/2018 0406   ALT 16 06/02/2018 0856   ALT 12 06/01/2017 0859   ALKPHOS 199 (H) 12/30/2018 0406   ALKPHOS 97 06/01/2017 0859   BILITOT 0.4 12/30/2018 0406   BILITOT 0.3 06/02/2018 0856   BILITOT 0.22 06/01/2017 0859   GFRNONAA 14 (L) 12/30/2018 0406   GFRNONAA >60 06/02/2018 0856   GFRAA 16 (L) 12/30/2018 0406   GFRAA >60 06/02/2018 0856   Lipase     Component Value Date/Time   LIPASE 24 11/20/2018 0355       Studies/Results: No results found.  Anti-infectives: Anti-infectives (From admission, onward)   Start     Dose/Rate Route Frequency Ordered Stop   12/07/2018 1030  clindamycin (CLEOCIN) 900 mg, gentamicin (GARAMYCIN) 240 mg in sodium chloride 0.9 % 1,000 mL for intraperitoneal lavage      Irrigation To Surgery 11/16/2018 1026 12/07/2018 1042   12/11/18 1500  anidulafungin (ERAXIS) 100 mg in sodium chloride 0.9 % 100 mL IVPB  Status:  Discontinued     100 mg 78 mL/hr over 100 Minutes Intravenous Every 24 hours 11/19/2018 1007 01/14/2019 1103   12/11/18 0600  piperacillin-tazobactam (ZOSYN) IVPB 3.375 g  Status:  Discontinued     3.375 g 100 mL/hr over 30 Minutes Intravenous Every 6 hours 12/11/18 0317 12/24/18 0737   11/16/2018 1800  piperacillin-tazobactam (ZOSYN) IVPB  2.25 g  Status:  Discontinued     2.25 g 100 mL/hr over 30 Minutes Intravenous Every 6 hours 11/20/2018 1413 12/11/18 0317   11/16/2018 1100  anidulafungin (ERAXIS) 200 mg in sodium chloride 0.9 % 200 mL IVPB     200 mg 78 mL/hr over 200 Minutes Intravenous  Once 12/11/2018 1007 11/22/2018 1456   11/24/2018 1100  piperacillin-tazobactam (ZOSYN) IVPB 3.375 g     3.375 g 100 mL/hr over 30 Minutes Intravenous  Once 11/19/2018 1027 11/26/2018 1245   12/08/18 1800  vancomycin (VANCOCIN) IVPB 750 mg/150 ml premix  Status:  Discontinued     750 mg 150 mL/hr over 60 Minutes Intravenous Every 48 hours 12/07/18 0822 12/07/18  0903   12/08/18 1000  ceFEPIme (MAXIPIME) 2 g in sodium chloride 0.9 % 100 mL IVPB  Status:  Discontinued     2 g 200 mL/hr over 30 Minutes Intravenous Every 24 hours 12/07/18 0822 12/07/18 0903   12/07/18 2200  ceFEPIme (MAXIPIME) 2 g in sodium chloride 0.9 % 100 mL IVPB  Status:  Discontinued     2 g 200 mL/hr over 30 Minutes Intravenous Every 12 hours 12/07/18 0903 12/14/2018 1027   12/07/18 1800  vancomycin (VANCOCIN) IVPB 750 mg/150 ml premix  Status:  Discontinued     750 mg 150 mL/hr over 60 Minutes Intravenous Every 24 hours 12/07/18 0903 12/09/18 1040   12/06/18 1800  vancomycin (VANCOCIN) IVPB 750 mg/150 ml premix  Status:  Discontinued     750 mg 150 mL/hr over 60 Minutes Intravenous Every 24 hours 12/05/18 1652 12/07/18 0822   12/05/18 1800  ceFEPIme (MAXIPIME) 2 g in sodium chloride 0.9 % 100 mL IVPB  Status:  Discontinued     2 g 200 mL/hr over 30 Minutes Intravenous Every 12 hours 12/05/18 1644 12/07/18 0822   12/05/18 1700  vancomycin (VANCOCIN) 1,250 mg in sodium chloride 0.9 % 250 mL IVPB     1,250 mg 166.7 mL/hr over 90 Minutes Intravenous  Once 12/05/18 1652 12/05/18 2019   12/03/2018 1800  fluconazole (DIFLUCAN) IVPB 200 mg  Status:  Discontinued     200 mg 100 mL/hr over 60 Minutes Intravenous Every 24 hours 12/01/2018 0748 12/12/2018 0847   11/22/2018 1800  fluconazole (DIFLUCAN) IVPB 400 mg  Status:  Discontinued     400 mg 100 mL/hr over 120 Minutes Intravenous Every 24 hours 12/06/2018 0847 11/22/2018 0856   12/04/2018 1400  piperacillin-tazobactam (ZOSYN) IVPB 2.25 g  Status:  Discontinued     2.25 g 100 mL/hr over 30 Minutes Intravenous Every 6 hours 12/02/2018 0748 12/01/18 1104   11/26/2018 0600  cefoTEtan (CEFOTAN) 2 g in sodium chloride 0.9 % 100 mL IVPB     2 g 200 mL/hr over 30 Minutes Intravenous On call to O.R. 11/22/18 1300 12/12/2018 0624   11/22/18 1800  fluconazole (DIFLUCAN) IVPB 400 mg  Status:  Discontinued     400 mg 100 mL/hr over 120 Minutes Intravenous Every  24 hours 11/16/2018 1528 11/22/2018 0748   11/22/18 1800  vancomycin (VANCOCIN) 1,500 mg in sodium chloride 0.9 % 500 mL IVPB  Status:  Discontinued     1,500 mg 250 mL/hr over 120 Minutes Intravenous Every 24 hours 11/22/18 0358 11/22/18 0750   11/22/18 0115  vancomycin (VANCOCIN) 1,500 mg in sodium chloride 0.9 % 500 mL IVPB     1,500 mg 250 mL/hr over 120 Minutes Intravenous  Once 11/22/18 0101 11/22/18 0319   11/16/2018 1630  fluconazole (  DIFLUCAN) IVPB 800 mg     800 mg 100 mL/hr over 240 Minutes Intravenous  Once 11/20/2018 1527 12/04/2018 2013   11/22/2018 1600  piperacillin-tazobactam (ZOSYN) IVPB 3.375 g  Status:  Discontinued     3.375 g 12.5 mL/hr over 240 Minutes Intravenous Every 8 hours 11/20/2018 1522 11/29/2018 0748   11/16/2018 0545  piperacillin-tazobactam (ZOSYN) IVPB 3.375 g     3.375 g 100 mL/hr over 30 Minutes Intravenous  Once 12/07/2018 0530 12/12/2018 0626   12/11/2018 0530  piperacillin-tazobactam (ZOSYN) IVPB 4.5 g  Status:  Discontinued     4.5 g 200 mL/hr over 30 Minutes Intravenous  Once 11/29/2018 0517 11/24/2018 0529       Assessment/Plan Perforatedsmall bowel with closed-loop obstruction  1.Exploratory laparotomy small bowel resection, lysis of adhesions, 1.5 hours, placement of wound VAC 12/12/2018 Dr. Coralie Keens 2. Reexploration of recent laparotomy WITH [facility] OUT 6/9 Dr. Rosendo Gros 3. Exploratory laparotomy, washout and placement of wound VAC 12/12/2018 Dr. Marcello Moores [place]ett 4. Exploratory laparotomy with anastomosis of small bowel and closure of abdominal wall 11/26/2018 Dr. Marcello Moores Cornett 5.Exploratory laparotomy, resection of previous anastomosis, wound VAC placement 11/26/2018 Dr. Autumn Messing 6. Reopening of recent laparotomy, small bowel resection, placement of wound VAC 11/22/2018, Dr. Dorris Fetch Byerly(FINDINGS:Pus throughout abdomen, though minimal succus. Bilious rind over intestines. Small hole on distal segment of small bowel with small amount of  bile)  7.EXPLORATORY LAPAROTOMY,WASH OUT OF ABDOMEN APPLICATION OF WOUND LAG(TXMIWOE)08/16/1222 Dr. Ninfa Linden 8.Exploratory laparotomy with abdominal washout,Small bowel anastomosis07/31/2020 Dr. Dema Severin Has small bowel anastomosisleak There are no further surgical options  Respiratory failure - on vent Acute renal failure - followed by Dr. Mickel Crow. CRRT stopped 7/13 as kidney function improved and patient making urine DM Severe malnutrition - Pre albumn - 9.3 on 01/12/2019 On TPN Anemia Hgb6(7/16) Code status DNR  FEN: N.p.o./TPN MG:NOIB currently BCW:UGQB, heparin held due to anemia Follow-up: To be determined POC: Carmen Stone,Carmen Stone Brother [phone]  [phone]  Stone,Carmen    [phone]   Plan:No changes. Appreciate palliative teams efforts.   LOS: 45 days    Carmen Stone , [facility] Surgery 01/05/2019, 8:31 AM Pager: [phone] Mon-Thurs 7:00 am-4:30 pm Fri 7:00 am -11:30 AM Sat-Sun 7:00 am-11:30 am

## 2019-01-05 NOTE — Progress Notes (Signed)
Daily Progress Note   Patient Name: Carmen Stone       Date: 01/05/2019 DOB: 1955/08/24  Age: 63 y.o. MRN#: [id number] Attending Physician: Nolon Nations, MD Primary Care Physician: Jettie Booze, NP Admit Date: 11/24/2018  Reason for Consultation/Follow-up: Establishing goals of care  Subjective: Carmen Stone is resting in bed, she appears chronically ill, doesn't arouse or engage.   Daughter is present at the bedside  see below  Length of Stay: 45  Current Medications: Scheduled Meds:  . chlorhexidine gluconate (MEDLINE KIT)  15 mL Mouth Rinse BID  . Chlorhexidine Gluconate Cloth  6 each Topical Daily  . dorzolamide-timolol  1 drop Both Eyes BID  . mouth rinse  15 mL Mouth Rinse 10 times per day  . pantoprazole (PROTONIX) IV  40 mg Intravenous Q24H    Continuous Infusions: . sodium chloride Stopped (12/25/18 0957)  . fentaNYL infusion INTRAVENOUS 300 mcg/hr (01/05/19 0800)  . norepinephrine (LEVOPHED) Adult infusion 10 mcg/min (01/05/19 0800)  . TPN ADULT (ION) 40 mL/hr at 01/05/19 0800  . TPN ADULT (ION)      PRN Meds: sodium chloride, acetaminophen, albuterol, fentaNYL, fentaNYL, hydrALAZINE, metoprolol tartrate, midazolam, sodium chloride flush  Physical Exam         General: Intubated.  Heart: Regular rate and rhythm.  Lungs: Good air movement on vent. Ext: No significant edema Skin: Warm and dry  Vital Signs: BP (!) 99/36   Pulse (!) 105   Temp 98.6 F (37 C) (Axillary)   Resp 18   Ht _0  (1.626 m)   Wt 67.5 kg   SpO2 100%   BMI 25.54 kg/m  SpO2: SpO2: 100 % O2 Device: O2 Device: Ventilator O2 Flow Rate: O2 Flow Rate (L/min): 4 L/min  Intake/output summary:   Intake/Output Summary (Last 24 hours) at 01/05/2019 1048 Last data filed at 01/05/2019  0800 Gross per 24 hour  Intake 2262.15 ml  Output 2095 ml  Net 167.15 ml   LBM: Last BM Date: 01/05/19 Baseline Weight: Weight: 74.8 kg Most recent weight: Weight: 67.5 kg       Palliative Assessment/Data:    Flowsheet Rows     Most Recent Value  Intake Tab  Referral Department  Surgery  Unit at Time of Referral  ICU  Palliative Care Primary Diagnosis  Sepsis/Infectious  Disease  Date Notified  11/24/2018  Palliative Care Type  New Palliative care  Reason for referral  Clarify Goals of Care  Date of Admission  11/20/2018  Date first seen by Palliative Care  12/14/18  # of days Palliative referral response time  1 Day(s)  # of days IP prior to Palliative referral  22  Clinical Assessment  Palliative Performance Scale Score  10%  Psychosocial & Spiritual Assessment  Palliative Care Outcomes  Palliative Care Outcomes  Clarified goals of care      Patient Active Problem List   Diagnosis Date Noted  . Dyspnea   . Palliative care by specialist   . Goals of care, counseling/discussion   . Acute respiratory failure with hypoxia and hypercapnia ([place]) 12/12/2018  . Toxic metabolic encephalopathy 16/03/9603  . Thrombocytopenia, acquired ([place]) 12/12/2018  . Small bowel perforation ([place])   . Pressure injury of skin 11/29/2018  . Encounter for central line placement   . Septic shock ([place])   . AKI (acute kidney injury) ([place])   . S/P dialysis catheter insertion ([place])   . 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 Perforated viscus 11/19/2018  . Port-A-Cath in place 08/03/2017  . Port catheter in place 01/31/2016  . Hypersensitivity reaction 02/08/2014  . Drug induced neutropenia(288.03) 01/30/2014  . Nausea with vomiting 11/04/2013  . 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 ATN (acute tubular necrosis) 11/02/2013  . Acute respiratory failure ([place]) 11/02/2013  . Acute respiratory failure with hypoxia ([place]) 10/25/2013  .  Abnormal urinalysis 10/25/2013  . Postoperative anemia due to acute blood loss 10/24/2013  . Breast abscess of female 10/22/2013  . HYPERLIPIDEMIA 07/16/2010  . ATELECTASIS 07/16/2010  . Type 2 diabetes mellitus ([place]) 06/24/2010  . 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 HYPOXEMIA 06/24/2010  . SMALL BOWEL OBSTRUCTION, HX OF 06/24/2010    Palliative Care Assessment & Plan   Patient Profile: 63 y.o. female  with past medical history of  admitted on 11/19/2018 with abdominal pain from perforation and complicated clinical course with multiple surgeries requiring resection of large portion of small bowel, code blue, and multisystem organ failure (EF 20% and vent dependent).  CRRT stopped with no plan to restart.     Assessment: Patient Active Problem List   Diagnosis Date Noted  . Dyspnea   . Palliative care by specialist   . Goals of care, counseling/discussion   . Acute respiratory failure with hypoxia and hypercapnia ([place]) 12/12/2018  . Toxic metabolic encephalopathy 54/02/8118  . Thrombocytopenia, acquired ([place]) 12/12/2018  . Small bowel perforation ([place])   . Pressure injury of skin 11/29/2018  . Encounter for central line placement   . Septic shock ([place])   . AKI (acute kidney injury) ([place])   . S/P dialysis catheter insertion ([place])   . 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 Perforated viscus 11/16/2018  . Port-A-Cath in place 08/03/2017  . Port catheter in place 01/31/2016  . Hypersensitivity reaction 02/08/2014  . Drug induced neutropenia(288.03) 01/30/2014  . Nausea with vomiting 11/04/2013  . 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 ATN (acute tubular necrosis) 11/02/2013  . Acute respiratory failure ([place]) 11/02/2013  . Acute respiratory failure with hypoxia ([place]) 10/25/2013  . Abnormal urinalysis 10/25/2013  . Postoperative anemia due to acute blood loss 10/24/2013  . Breast abscess of female 10/22/2013  . HYPERLIPIDEMIA  07/16/2010  . ATELECTASIS 07/16/2010  . Type 2 diabetes mellitus ([place]) 06/24/2010  .  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 HYPOXEMIA 06/24/2010  . SMALL BOWEL OBSTRUCTION, HX OF 06/24/2010    Recommendations/Plan: Continue current mode of care: Continuation of mechanical ventilation, artificial nutrition, pressors.  Patient also on fentanyl. Continuing to offer supportive presence to Ms. [place] and her family.  I met with the daughter and be debriefed from yesterday's ethics committee/family meeting.  Patient's daughter states that she continues to be aware of the patient's lack of recovery but wishes to maintain current mode of care for now.  She is thankful for information received from yesterday's meeting.      Code Status: DNR  Prognosis:   Poor  Discharge Planning:  Anticipated hospital death.   Care plan was discussed with  Patient's daughter.    Thank you for allowing the Palliative Medicine Team to assist in the care of this patient.   Time In: 10 Time Out: 10.15 Total Time 15 Prolonged Time Billed no       Greater than 50%  of this time was spent counseling and coordinating care related to the above assessment and plan.  Loistine Chance, MD [facility] [phone]  Please contact Palliative Medicine Team phone at [phone] for questions and concerns.

## 2019-01-06 MED ORDER — TRAVASOL 10 % IV SOLN
INTRAVENOUS | Status: AC
  Administered 2019-01-06: 18:00:00 via INTRAVENOUS
  Filled 2019-01-06: qty 480

## 2019-01-06 NOTE — Progress Notes (Signed)
NAME:  Carmen Stone, MRN:  [id number], DOB:  03-08-1956, LOS: 72 ADMISSION DATE:  12/05/2018, CONSULTATION DATE:  11/21/2018 REFERRING MD:  Dr. Ninfa Linden, Surgery, CHIEF COMPLAINT:  Abdominal pain   Brief History   63 y/o female presented to ER 11/26/2018 with nausea, vomiting, abdominal pain.  CT abd/pelvis showed focal perforated loop of SB with free air with multiple complex ventral hernias.  Underwent emergent laparotomy with SB resection, lysis of adhesions and wound vac.  Remained on vent and pressors post op.  Hospital course complicated by respiratory arrest 6/13, multiple reintubations, intermittent vasopressor needs, AKI on CVVHD.     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, HTN, HLD, DM type II  Significant Hospital Events   6/07 Admit, to OR 6/08 transfuse FFP, on multiple pressors 6/09 To OR for wash out, start CRRT 6/13 Acute respiratory arrest overnight, off CRRT, on pressors, limit sedation 6/14 OR for bowel anastomosis and closure of abdominal wall 6/15 Changing volume goal on CRRT to -100 cc an hour. Weaned 7 hours PSV   6/16 Encephalopathic. Hypertensive, adding labetalol.  IV TNA started 6/18 Neuro status improved.  Required brief vasoactive drips.  Net negative fluid volume status.PSV 6/19 Extubated 6/20 Reintubated for altered mentation, head CT negative  6/23 Febrile overnight, Blood Cx.  6/25 L IJ, subclavian, axillary  + DVT. Heparin initiated. 6/26  CT ABD/pelvis positive for free air and ascites. Back to OR  for ex lap of perforated viscous, resection of previous anastomosis, application of wound vac. 1U RBCs intra op.   6/28 Back on pressors with hgb <7 / sedated on fent drip, PRBC 7/01 Less sedation, following commands, abd open with VAC, on CVVHD (even) 7/03 OR, unable to close but bowel "looks some better", hematoma in abd, on CVVHD 7/06 OR for laparotomy, washout, SB anastomosis  7/14 Febrile over night to 102.3,  Hypotensive 500 cc bolus and albumin, CVVHD d/c 7/15 no acute changes.  7/16 Hypotensive, anemia - 2 RBCs, fluid, and NE started 7/17: Febrile.  Off pressors.  Critical care team taking vasoactive drips off from medicine profile.  Nothing further to offer at this point.  Family still not on board with transition to comfort however agreeable to no escalation.  We will not add back therapies that do not help her 7/17: Family adamant about adding back norepinephrine.  This is in spite of strong recommendations against this from critical care service. 7/17 through 7/20: No other changes other than norepinephrine dependent 7/21 meeting w/ family and ethics   Consults:  Renal 6/09 AKI, acidosis Cardiology 6/09 Takotsubo CM  Palliative care 6/30 Goals of care  Procedures:  Lt radial aline 6/09 >> discontinued ETT 6/07 >> 6/19, 6/20 >> Rt IJ HD 6/09 >> Rt [place] CVL 6/09 >>  Significant Diagnostic Tests:  CT abd/pelvis 6/07 >> focal perforated loop of SB with free air with multiple complex ventral hernias Echo 6/08 >> EF 20%, Takotsubo CM CT head 6/20 >> negative for any acute findings CT abdomen 6/20 >> bilateral effusions, atelectasis left lower lobe, results noted.    CT Chest 6/20 >> small bilateral pleural effusions with compressive atelectasis, anterior R 4-7 rib fractures CT ABD/Pelvis 6/20 >> small bowel surgical changes, mild circumferential wall thickening of scattered small bowel loops, small to moderate ascites LUE Duplex 6/24 >> DVT L IJ, subclavian, axillary, SVT L cephalic  CT ABD/Pelvis 2/53 >> large volume ascites with intraperitoneal free air  Micro Data:  COVID 6/07 >> negative Tracheal aspirate 6/20 >> E Coli sensitive to zosyn BCx2 6/23 >> negative BCx2  6/26 >> negative   Antimicrobials:  Zosyn 6/07 >> 6/17 Diflucan 6/07 >> 6/10 Cefepime 6/21 >>6/26 Vanco 6/21 >> 6/25 Anidulafungin 6/26 >> 7/06 Zosyn 6/26 >>7/10  Interim history/subjective:  No change   Objective   Blood pressure (Abnormal) 96/34, pulse 98, temperature 99 F (37.2 C), temperature source Axillary, resp. rate 18, height 5\' 4"  (1.626 m), weight 67.5 kg, SpO2 100 %.    Vent Mode: PRVC FiO2 (%):  [30 %] 30 % Set Rate:  [18 bmp] 18 bmp Vt Set:  [430 mL] 430 mL PEEP:  [5 cmH20] 5 cmH20 Plateau Pressure:  [20 cmH20-24 cmH20] 24 cmH20   Intake/Output Summary (Last 24 hours) at 01/06/2019 2595 Last data filed at 01/06/2019 0420 Gross per 24 hour  Intake 1954.16 ml  Output 2515 ml  Net -560.84 ml   Filed Weights   12/25/18 0500 12/27/18 0500 01/04/19 6387  Weight: 65 kg 65.1 kg 67.5 kg   Physical Exam:  General this is a 63 year old female patient who is sedated on mechanical ventilator HEENT normocephalic atraumatic orally intubated mucous membranes moist Pulmonary: Diminished bases no accessory use Cardiac: Regular rate and rhythm Abdomen: Open wound with purulent drainage multiple drains GU: Concentrated yellow Neuro: [facility] Problem list     Assessment & Plan:   Ventilator dependence w/ failure to wean  in setting of prolonged critical illness, severe protein calorie malnutrition, metabolic encephalopathy and on-going peritonitis Abd peritonitis 2/2 anastomotic leak with recurrent septic shock Acute renal failure  Anemia of critical illness Severe Protein Malnutrition  Discussion   Remains ventilator and pressor dependent.  Long discussion with family including that this committee on 7/21.  We agreed to not escalate care beyond current level.  I was very clear with the patient's brother and daughter that I do not think she will survive this illness and she will die as a complication of it.  They remain hopeful for a miracle, however did verbalize an understanding that she is terminally ill at the same time.  We will continue supportive care for now, I have told them that she is not a candidate for trach and that eventually will need to decide  on removing the endotracheal tube however I did not push that decision during our visit. -Still no change Plan Continue full ventilator support Continue PRN as needed pain medication and fever management Continue wound care We will not escalate norepinephrine above 10 mics per minute VAP bundle  No changes terminally ill  Best practice:  Diet: TPN DVT prophylaxis: SCDs GI prophylaxis: PPI Mobility: Bedrest Code Status: DO NOT RESUSCITATE Disposition ethic meeting this afternoon  Erick Colace ACNP-BC [place] Pager # [phone] OR # [phone] if no answer

## 2019-01-06 NOTE — Progress Notes (Signed)
[facility] Progress Note  17 Days Post-Op  Subjective: CC-  Hypotensive. TMAX 100.5  Objective: Vital signs in last 24 hours: Temp:  [98.1 F (36.7 C)-100.5 F (38.1 C)] 98.1 F (36.7 C) (07/23 0405) Pulse Rate:  [91-118] 91 (07/23 0630) Resp:  [15-21] 18 (07/23 0630) BP: (81-121)/(27-64) 96/34 (07/23 0630) SpO2:  [98 %-100 %] 100 % (07/23 0630) FiO2 (%):  [30 %] 30 % (07/23 0405) Last BM Date: 01/05/19  Intake/Output from previous day: 07/22 0701 - 07/23 0700 In: 2061.6 [I.V.:2061.6] Out: 2515 [Urine:1890; Emesis/NG output:225; Drains:400] Intake/Output this shift: No intake/output data recorded.  PE: Gen:On the vent Pulm:CTAB, mechanically ventilated Cardio: RRR Abd: Soft,grimaces in pain to palpation,Eakins pouchto suction with good seal/feculent drainage Ext:2+ edema BLE/BUE Skin: warm and dry Neuro:does not open eyes for me this morning or follow any commands   Lab Results:  No results for input(s): WBC, HGB, HCT, PLT in the last 72 hours. BMET No results for input(s): NA, K, CL, CO2, GLUCOSE, BUN, CREATININE, CALCIUM in the last 72 hours. PT/INR No results for input(s): LABPROT, INR in the last 72 hours. CMP     Component Value Date/Time   NA 143 12/30/2018 0406   NA 138 06/01/2017 0859   K 3.0 (L) 12/30/2018 0406   K 4.2 06/01/2017 0859   CL 97 (L) 12/30/2018 0406   CO2 32 12/30/2018 0406   CO2 24 06/01/2017 0859   GLUCOSE 382 (H) 12/30/2018 0406   GLUCOSE 118 06/01/2017 0859   BUN 84 (H) 12/30/2018 0406   BUN 15.2 06/01/2017 0859   CREATININE 3.33 (H) 12/30/2018 0406   CREATININE 0.79 06/02/2018 0856   CREATININE 0.7 06/01/2017 0859   CALCIUM 7.7 (L) 12/30/2018 0406   CALCIUM 9.0 06/01/2017 0859   PROT 6.7 12/30/2018 0406   PROT 7.0 06/01/2017 0859   ALBUMIN 2.0 (L) 12/30/2018 0406   ALBUMIN 3.7 06/01/2017 0859   AST 24 12/30/2018 0406   AST 13 (L) 06/02/2018 0856   AST 14 06/01/2017 0859   ALT 23 12/30/2018 0406    ALT 16 06/02/2018 0856   ALT 12 06/01/2017 0859   ALKPHOS 199 (H) 12/30/2018 0406   ALKPHOS 97 06/01/2017 0859   BILITOT 0.4 12/30/2018 0406   BILITOT 0.3 06/02/2018 0856   BILITOT 0.22 06/01/2017 0859   GFRNONAA 14 (L) 12/30/2018 0406   GFRNONAA >60 06/02/2018 0856   GFRAA 16 (L) 12/30/2018 0406   GFRAA >60 06/02/2018 0856   Lipase     Component Value Date/Time   LIPASE 24 11/17/2018 0355       Studies/Results: No results found.  Anti-infectives: Anti-infectives (From admission, onward)   Start     Dose/Rate Route Frequency Ordered Stop   12/01/2018 1030  clindamycin (CLEOCIN) 900 mg, gentamicin (GARAMYCIN) 240 mg in sodium chloride 0.9 % 1,000 mL for intraperitoneal lavage      Irrigation To Surgery 11/19/2018 1026 11/29/2018 1042   12/11/18 1500  anidulafungin (ERAXIS) 100 mg in sodium chloride 0.9 % 100 mL IVPB  Status:  Discontinued     100 mg 78 mL/hr over 100 Minutes Intravenous Every 24 hours 11/30/2018 1007 12/24/2018 1103   12/11/18 0600  piperacillin-tazobactam (ZOSYN) IVPB 3.375 g  Status:  Discontinued     3.375 g 100 mL/hr over 30 Minutes Intravenous Every 6 hours 12/11/18 0317 12/24/18 0737   11/26/2018 1800  piperacillin-tazobactam (ZOSYN) IVPB 2.25 g  Status:  Discontinued     2.25 g 100 mL/hr over 30  Minutes Intravenous Every 6 hours 12/08/2018 1413 12/11/18 0317   12/08/2018 1100  anidulafungin (ERAXIS) 200 mg in sodium chloride 0.9 % 200 mL IVPB     200 mg 78 mL/hr over 200 Minutes Intravenous  Once 12/06/2018 1007 11/17/2018 1456   11/15/2018 1100  piperacillin-tazobactam (ZOSYN) IVPB 3.375 g     3.375 g 100 mL/hr over 30 Minutes Intravenous  Once 11/24/2018 1027 12/06/2018 1245   12/08/18 1800  vancomycin (VANCOCIN) IVPB 750 mg/150 ml premix  Status:  Discontinued     750 mg 150 mL/hr over 60 Minutes Intravenous Every 48 hours 12/07/18 0822 12/07/18 0903   12/08/18 1000  ceFEPIme (MAXIPIME) 2 g in sodium chloride 0.9 % 100 mL IVPB  Status:  Discontinued     2 g 200  mL/hr over 30 Minutes Intravenous Every 24 hours 12/07/18 0822 12/07/18 0903   12/07/18 2200  ceFEPIme (MAXIPIME) 2 g in sodium chloride 0.9 % 100 mL IVPB  Status:  Discontinued     2 g 200 mL/hr over 30 Minutes Intravenous Every 12 hours 12/07/18 0903 11/22/2018 1027   12/07/18 1800  vancomycin (VANCOCIN) IVPB 750 mg/150 ml premix  Status:  Discontinued     750 mg 150 mL/hr over 60 Minutes Intravenous Every 24 hours 12/07/18 0903 12/09/18 1040   12/06/18 1800  vancomycin (VANCOCIN) IVPB 750 mg/150 ml premix  Status:  Discontinued     750 mg 150 mL/hr over 60 Minutes Intravenous Every 24 hours 12/05/18 1652 12/07/18 0822   12/05/18 1800  ceFEPIme (MAXIPIME) 2 g in sodium chloride 0.9 % 100 mL IVPB  Status:  Discontinued     2 g 200 mL/hr over 30 Minutes Intravenous Every 12 hours 12/05/18 1644 12/07/18 0822   12/05/18 1700  vancomycin (VANCOCIN) 1,250 mg in sodium chloride 0.9 % 250 mL IVPB     1,250 mg 166.7 mL/hr over 90 Minutes Intravenous  Once 12/05/18 1652 12/05/18 2019   12/03/2018 1800  fluconazole (DIFLUCAN) IVPB 200 mg  Status:  Discontinued     200 mg 100 mL/hr over 60 Minutes Intravenous Every 24 hours 12/01/2018 0748 11/29/2018 0847   11/17/2018 1800  fluconazole (DIFLUCAN) IVPB 400 mg  Status:  Discontinued     400 mg 100 mL/hr over 120 Minutes Intravenous Every 24 hours 12/08/2018 0847 12/12/2018 0856   12/07/2018 1400  piperacillin-tazobactam (ZOSYN) IVPB 2.25 g  Status:  Discontinued     2.25 g 100 mL/hr over 30 Minutes Intravenous Every 6 hours 12/11/2018 0748 12/01/18 1104   11/17/2018 0600  cefoTEtan (CEFOTAN) 2 g in sodium chloride 0.9 % 100 mL IVPB     2 g 200 mL/hr over 30 Minutes Intravenous On call to O.R. 11/22/18 1300 11/24/2018 0624   11/22/18 1800  fluconazole (DIFLUCAN) IVPB 400 mg  Status:  Discontinued     400 mg 100 mL/hr over 120 Minutes Intravenous Every 24 hours 12/05/2018 1528 11/24/2018 0748   11/22/18 1800  vancomycin (VANCOCIN) 1,500 mg in sodium chloride 0.9 % 500 mL  IVPB  Status:  Discontinued     1,500 mg 250 mL/hr over 120 Minutes Intravenous Every 24 hours 11/22/18 0358 11/22/18 0750   11/22/18 0115  vancomycin (VANCOCIN) 1,500 mg in sodium chloride 0.9 % 500 mL IVPB     1,500 mg 250 mL/hr over 120 Minutes Intravenous  Once 11/22/18 0101 11/22/18 0319   12/03/2018 1630  fluconazole (DIFLUCAN) IVPB 800 mg     800 mg 100 mL/hr over 240 Minutes Intravenous  Once 11/26/2018 1527 12/07/2018 2013   11/20/2018 1600  piperacillin-tazobactam (ZOSYN) IVPB 3.375 g  Status:  Discontinued     3.375 g 12.5 mL/hr over 240 Minutes Intravenous Every 8 hours 12/07/2018 1522 11/20/2018 0748   11/22/2018 0545  piperacillin-tazobactam (ZOSYN) IVPB 3.375 g     3.375 g 100 mL/hr over 30 Minutes Intravenous  Once 11/16/2018 0530 11/26/2018 0626   12/11/2018 0530  piperacillin-tazobactam (ZOSYN) IVPB 4.5 g  Status:  Discontinued     4.5 g 200 mL/hr over 30 Minutes Intravenous  Once 11/19/2018 0517 12/12/2018 0529       Assessment/Plan Perforatedsmall bowel with closed-loop obstruction  1.Exploratory laparotomy small bowel resection, lysis of adhesions, 1.5 hours, placement of wound VAC 11/29/2018 Dr. Coralie Keens 2. Reexploration of recent laparotomyWITH [facility] OUT 6/9 Dr. Rosendo Gros 3. Exploratory laparotomy, washout and placement of wound VAC 12/06/2018 Dr. Marcello Moores [place] 4. Exploratory laparotomy with anastomosis of small bowel and closure of abdominal wall 11/19/2018 Dr. Marcello Moores [place] 5.Exploratory laparotomy, resection of previous anastomosis, wound VAC placement 12/07/2018 Dr. Autumn Messing 6. Reopening of recent laparotomy, small bowel resection, placement of wound VAC 12/12/2018, Dr. Dorris Fetch [place]er[place](FINDINGS:Pus throughout abdomen, though minimal succus. Bilious rind over intestines. Small hole on distal segment of small bowel with small amount of bile) 7.EXPLORATORY LAPAROTOMY,WASH OUT OF ABDOMEN APPLICATION OF WOUND PQD(IYMEBRA)3/0/9407 Dr. Ninfa Linden 8.Exploratory laparotomy  with abdominal washout,Small bowel anastomosis07/17/2020 Dr. Dema Severin Has small bowel anastomosisleak There are no further surgical options  Respiratory failure - on vent Acute renal failure - followed by Dr. Mickel Crow. CRRT stopped 7/13 as kidney function improved and patient making urine DM Severe malnutrition - Pre albumn - 9.3 on 12/21/2018 On TPN Anemia Hgb6(7/16) Code status DNR  FEN: N.p.o./TPN WK:GSUP currently JSR:PRXY, heparin held due to anemia Follow-up: To be determined POC: Mattier,George Brother [phone]  [phone]  Matier,Vickie    [phone]   Plan:Continuing current plan of care with no plans for escalation.    LOS: 26 days    Wellington Hampshire , [facility] 01/06/2019, 8:00 AM Pager: [phone] Mon-Thurs 7:00 am-4:30 pm Fri 7:00 am -11:30 AM Sat-Sun 7:00 am-11:30 am

## 2019-01-06 NOTE — Progress Notes (Signed)
Daily Progress Note   Patient Name: Carmen Stone       Date: 01/06/2019 DOB: July 22, 1955  Age: 63 y.o. MRN#: [id number] Attending Physician: Nolon Nations, MD Primary Care Physician: Jettie Booze, NP Admit Date: 11/15/2018  Reason for Consultation/Follow-up: Establishing goals of care  Subjective: Carmen Stone is resting in bed, she appears chronically ill, doesn't arouse or engage.   No family at the bedside  see below  Length of Stay: 21  Current Medications: Scheduled Meds:  . chlorhexidine gluconate (MEDLINE KIT)  15 mL Mouth Rinse BID  . Chlorhexidine Gluconate Cloth  6 each Topical Daily  . dorzolamide-timolol  1 drop Both Eyes BID  . mouth rinse  15 mL Mouth Rinse 10 times per day  . pantoprazole (PROTONIX) IV  40 mg Intravenous Q24H    Continuous Infusions: . sodium chloride Stopped (12/25/18 0957)  . fentaNYL infusion INTRAVENOUS 200 mcg/hr (01/06/19 0900)  . norepinephrine (LEVOPHED) Adult infusion 10 mcg/min (01/06/19 0929)  . TPN ADULT (ION) 40 mL/hr at 01/06/19 0900  . TPN ADULT (ION)      PRN Meds: sodium chloride, acetaminophen, albuterol, fentaNYL, fentaNYL, hydrALAZINE, metoprolol tartrate, midazolam, sodium chloride flush  Physical Exam         Intubated, on vent, appears unresponsive, has abdominal wound, not alert.    Vital Signs: BP (!) 97/29   Pulse (!) 107   Temp 99 F (37.2 C) (Axillary)   Resp 18   Ht '5\' 4"'  (1.626 m)   Wt 67.5 kg   SpO2 100%   BMI 25.54 kg/m  SpO2: SpO2: 100 % O2 Device: O2 Device: Ventilator O2 Flow Rate: O2 Flow Rate (L/min): 4 L/min  Intake/output summary:   Intake/Output Summary (Last 24 hours) at 01/06/2019 1108 Last data filed at 01/06/2019 0900 Gross per 24 hour  Intake 2227.83 ml  Output 2515 ml  Net  -287.17 ml   LBM: Last BM Date: 01/06/19 Baseline Weight: Weight: 74.8 kg Most recent weight: Weight: 67.5 kg       Palliative Assessment/Data:    Flowsheet Rows     Most Recent Value  Intake Tab  Referral Department  Surgery  Unit at Time of Referral  ICU  Palliative Care Primary Diagnosis  Sepsis/Infectious Disease  Date Notified  12/06/2018  Palliative Care Type  New  Palliative care  Reason for referral  Clarify Goals of Care  Date of Admission  11/29/2018  Date first seen by Palliative Care  12/14/18  # of days Palliative referral response time  1 Day(s)  # of days IP prior to Palliative referral  22  Clinical Assessment  Palliative Performance Scale Score  10%  Psychosocial & Spiritual Assessment  Palliative Care Outcomes  Palliative Care Outcomes  Clarified goals of care      Patient Active Problem List   Diagnosis Date Noted  . Dyspnea   . Palliative care by specialist   . Goals of care, counseling/discussion   . Acute respiratory failure with hypoxia and hypercapnia ([place]) 12/12/2018  . Toxic metabolic encephalopathy 83/15/1761  . Thrombocytopenia, acquired ([place]) 12/12/2018  . Small bowel perforation ([place])   . Pressure injury of skin 11/29/2018  . Encounter for central line placement   . Septic shock ([place])   . AKI (acute kidney injury) ([place])   . S/P dialysis catheter insertion ([place])   . 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 Perforated viscus 12/09/2018  . Port-A-Cath in place 08/03/2017  . Port catheter in place 01/31/2016  . Hypersensitivity reaction 02/08/2014  . Drug induced neutropenia(288.03) 01/30/2014  . Nausea with vomiting 11/04/2013  . 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 ATN (acute tubular necrosis) 11/02/2013  . Acute respiratory failure ([place]) 11/02/2013  . Acute respiratory failure with hypoxia ([place]) 10/25/2013  . Abnormal urinalysis 10/25/2013  . Postoperative anemia due to acute  blood loss 10/24/2013  . Breast abscess of female 10/22/2013  . HYPERLIPIDEMIA 07/16/2010  . ATELECTASIS 07/16/2010  . Type 2 diabetes mellitus ([place]) 06/24/2010  . 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 HYPOXEMIA 06/24/2010  . SMALL BOWEL OBSTRUCTION, HX OF 06/24/2010    Palliative Care Assessment & Plan   Patient Profile: 63 y.o. female  with past medical history of  admitted on 12/04/2018 with abdominal pain from perforation and complicated clinical course with multiple surgeries requiring resection of large portion of small bowel, code blue, and multisystem organ failure (EF 20% and vent dependent).  CRRT stopped with no plan to restart.     Assessment: Patient Active Problem List   Diagnosis Date Noted  . Dyspnea   . Palliative care by specialist   . Goals of care, counseling/discussion   . Acute respiratory failure with hypoxia and hypercapnia ([place]) 12/12/2018  . Toxic metabolic encephalopathy 60/73/7106  . Thrombocytopenia, acquired ([place]) 12/12/2018  . Small bowel perforation ([place])   . Pressure injury of skin 11/29/2018  . Encounter for central line placement   . Septic shock ([place])   . AKI (acute kidney injury) ([place])   . S/P dialysis catheter insertion ([place])   . 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 Perforated viscus 12/02/2018  . Port-A-Cath in place 08/03/2017  . Port catheter in place 01/31/2016  . Hypersensitivity reaction 02/08/2014  . Drug induced neutropenia(288.03) 01/30/2014  . Nausea with vomiting 11/04/2013  . 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 ATN (acute tubular necrosis) 11/02/2013  . Acute respiratory failure ([place]) 11/02/2013  . Acute respiratory failure with hypoxia ([place]) 10/25/2013  . Abnormal urinalysis 10/25/2013  . Postoperative anemia due to acute blood loss 10/24/2013  . Breast abscess of female 10/22/2013  . HYPERLIPIDEMIA 07/16/2010  . ATELECTASIS 07/16/2010  . Type 2 diabetes mellitus ([place])  06/24/2010  . 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 HYPOXEMIA 06/24/2010  . SMALL BOWEL OBSTRUCTION, HX  OF 06/24/2010    Recommendations/Plan: Continue current mode of care: Continuation of mechanical ventilation, artificial nutrition, pressors.  Patient also on fentanyl. No further escalation of care.  Anticipate hospital death        Code Status: DNR  Prognosis:   Poor  Discharge Planning:  Anticipated hospital death.   Care plan was discussed with     briefly discussed with [facility] MD.   Thank you for allowing the Palliative Medicine Team to assist in the care of this patient.   Time In: 10 Time Out: 10.15 Total Time 15 Prolonged Time Billed no       Greater than 50%  of this time was spent counseling and coordinating care related to the above assessment and plan.  Loistine Chance, MD [place] Team [phone]  Please contact Palliative Medicine Team phone at [phone] for questions and concerns.

## 2019-01-07 MED ORDER — SODIUM CHLORIDE 0.9 % IV SOLN
5.0000 mg/h | INTRAVENOUS | Status: DC
  Administered 2019-01-08: 2 mg/h via INTRAVENOUS
  Filled 2019-01-07 (×4): qty 10

## 2019-01-07 MED ORDER — TRAVASOL 10 % IV SOLN
INTRAVENOUS | Status: DC
  Filled 2019-01-07: qty 480

## 2019-01-07 MED ORDER — MIDAZOLAM HCL 2 MG/2ML IJ SOLN: 1.0000 mg | INTRAMUSCULAR | Status: DC | PRN

## 2019-01-07 MED ORDER — SODIUM CHLORIDE 0.9 % IV SOLN
5.0000 mg/h | INTRAVENOUS | Status: DC
  Administered 2019-01-07 (×2): 5 mg/h via INTRAVENOUS
  Filled 2019-01-07 (×3): qty 10

## 2019-01-07 NOTE — Progress Notes (Signed)
[facility] Progress Note  18 Days Post-Op  Subjective: CC-  Events from last night noted. Patient had a tonic-clonic seizure, terminated by a dose of Versed. Appears calm this morning. Brother, Iona Beard at bedside.  Objective: Vital signs in last 24 hours: Temp:  [97.4 F (36.3 C)-100.6 F (38.1 C)] 99.3 F (37.4 C) (07/24 0416) Pulse Rate:  [29-119] 58 (07/24 0625) Resp:  [16-29] 17 (07/24 0700) BP: (81-127)/(28-63) 104/60 (07/24 0700) SpO2:  [77 %-100 %] 100 % (07/24 0700) FiO2 (%):  [30 %] 30 % (07/24 0328) Last BM Date: 01/06/19  Intake/Output from previous day: 07/23 0701 - 07/24 0700 In: 2610.9 [I.V.:2610.9] Out: 2180 [Urine:1455; Emesis/NG output:175; Drains:550] Intake/Output this shift: Total I/O In: 82 [I.V.:82] Out: 0   PE: Gen:On the vent, yawned  Pulm:CTAB, mechanically ventilated Cardio: tachy Abd: Soft,grimaces in pain to palpation,[place]ak[place] pouchto suction with good seal/feculent drainage Ext:2+ edema BLE/BUE Skin: warm and dry Neuro:does not open eyes for me this morningor follow any commands    Lab Results:  No results for input(s): WBC, HGB, HCT, PLT in the last 72 hours. BMET No results for input(s): NA, K, CL, CO2, GLUCOSE, BUN, CREATININE, CALCIUM in the last 72 hours. PT/INR No results for input(s): LABPROT, INR in the last 72 hours. CMP     Component Value Date/Time   NA 143 12/30/2018 0406   NA 138 06/01/2017 0859   K 3.0 (L) 12/30/2018 0406   K 4.2 06/01/2017 0859   CL 97 (L) 12/30/2018 0406   CO2 32 12/30/2018 0406   CO2 24 06/01/2017 0859   GLUCOSE 382 (H) 12/30/2018 0406   GLUCOSE 118 06/01/2017 0859   BUN 84 (H) 12/30/2018 0406   BUN 15.2 06/01/2017 0859   CREATININE 3.33 (H) 12/30/2018 0406   CREATININE 0.79 06/02/2018 0856   CREATININE 0.7 06/01/2017 0859   CALCIUM 7.7 (L) 12/30/2018 0406   CALCIUM 9.0 06/01/2017 0859   PROT 6.7 12/30/2018 0406   PROT 7.0 06/01/2017 0859   ALBUMIN 2.0 (L)  12/30/2018 0406   ALBUMIN 3.7 06/01/2017 0859   AST 24 12/30/2018 0406   AST 13 (L) 06/02/2018 0856   AST 14 06/01/2017 0859   ALT 23 12/30/2018 0406   ALT 16 06/02/2018 0856   ALT 12 06/01/2017 0859   ALKPHOS 199 (H) 12/30/2018 0406   ALKPHOS 97 06/01/2017 0859   BILITOT 0.4 12/30/2018 0406   BILITOT 0.3 06/02/2018 0856   BILITOT 0.22 06/01/2017 0859   GFRNONAA 14 (L) 12/30/2018 0406   GFRNONAA >60 06/02/2018 0856   GFRAA 16 (L) 12/30/2018 0406   GFRAA >60 06/02/2018 0856   Lipase     Component Value Date/Time   LIPASE 24 12/14/2018 0355       Studies/Results: No results found.  Anti-infectives: Anti-infectives (From admission, onward)   Start     Dose/Rate Route Frequency Ordered Stop   11/17/2018 1030  clindamycin (CLEOCIN) 900 mg, gentamicin (GARAMYCIN) 240 mg in sodium chloride 0.9 % 1,000 mL for intraperitoneal lavage      Irrigation To Surgery 12/02/2018 1026 11/29/2018 1042   12/11/18 1500  anidulafungin (ERAXIS) 100 mg in sodium chloride 0.9 % 100 mL IVPB  Status:  Discontinued     100 mg 78 mL/hr over 100 Minutes Intravenous Every 24 hours 11/29/2018 1007 01/14/2019 1103   12/11/18 0600  piperacillin-tazobactam (ZOSYN) IVPB 3.375 g  Status:  Discontinued     3.375 g 100 mL/hr over 30 Minutes Intravenous Every 6 hours 12/11/18  7867 12/24/18 0737   12/03/2018 1800  piperacillin-tazobactam (ZOSYN) IVPB 2.25 g  Status:  Discontinued     2.25 g 100 mL/hr over 30 Minutes Intravenous Every 6 hours 11/24/2018 1413 12/11/18 0317   12/01/2018 1100  anidulafungin (ERAXIS) 200 mg in sodium chloride 0.9 % 200 mL IVPB     200 mg 78 mL/hr over 200 Minutes Intravenous  Once 11/26/2018 1007 11/18/2018 1456   11/18/2018 1100  piperacillin-tazobactam (ZOSYN) IVPB 3.375 g     3.375 g 100 mL/hr over 30 Minutes Intravenous  Once 12/07/2018 1027 12/06/2018 1245   12/08/18 1800  vancomycin (VANCOCIN) IVPB 750 mg/150 ml premix  Status:  Discontinued     750 mg 150 mL/hr over 60 Minutes Intravenous Every  48 hours 12/07/18 0822 12/07/18 0903   12/08/18 1000  ceFEPIme (MAXIPIME) 2 g in sodium chloride 0.9 % 100 mL IVPB  Status:  Discontinued     2 g 200 mL/hr over 30 Minutes Intravenous Every 24 hours 12/07/18 0822 12/07/18 0903   12/07/18 2200  ceFEPIme (MAXIPIME) 2 g in sodium chloride 0.9 % 100 mL IVPB  Status:  Discontinued     2 g 200 mL/hr over 30 Minutes Intravenous Every 12 hours 12/07/18 0903 11/26/2018 1027   12/07/18 1800  vancomycin (VANCOCIN) IVPB 750 mg/150 ml premix  Status:  Discontinued     750 mg 150 mL/hr over 60 Minutes Intravenous Every 24 hours 12/07/18 0903 12/09/18 1040   12/06/18 1800  vancomycin (VANCOCIN) IVPB 750 mg/150 ml premix  Status:  Discontinued     750 mg 150 mL/hr over 60 Minutes Intravenous Every 24 hours 12/05/18 1652 12/07/18 0822   12/05/18 1800  ceFEPIme (MAXIPIME) 2 g in sodium chloride 0.9 % 100 mL IVPB  Status:  Discontinued     2 g 200 mL/hr over 30 Minutes Intravenous Every 12 hours 12/05/18 1644 12/07/18 0822   12/05/18 1700  vancomycin (VANCOCIN) 1,250 mg in sodium chloride 0.9 % 250 mL IVPB     1,250 mg 166.7 mL/hr over 90 Minutes Intravenous  Once 12/05/18 1652 12/05/18 2019   12/09/2018 1800  fluconazole (DIFLUCAN) IVPB 200 mg  Status:  Discontinued     200 mg 100 mL/hr over 60 Minutes Intravenous Every 24 hours 12/12/2018 0748 12/12/2018 0847   11/24/2018 1800  fluconazole (DIFLUCAN) IVPB 400 mg  Status:  Discontinued     400 mg 100 mL/hr over 120 Minutes Intravenous Every 24 hours 12/12/2018 0847 11/17/2018 0856   11/26/2018 1400  piperacillin-tazobactam (ZOSYN) IVPB 2.25 g  Status:  Discontinued     2.25 g 100 mL/hr over 30 Minutes Intravenous Every 6 hours 11/18/2018 0748 12/01/18 1104   12/05/2018 0600  cefoTEtan (CEFOTAN) 2 g in sodium chloride 0.9 % 100 mL IVPB     2 g 200 mL/hr over 30 Minutes Intravenous On call to O.R. 11/22/18 1300 11/15/2018 0624   11/22/18 1800  fluconazole (DIFLUCAN) IVPB 400 mg  Status:  Discontinued     400 mg 100 mL/hr  over 120 Minutes Intravenous Every 24 hours 12/05/2018 1528 11/19/2018 0748   11/22/18 1800  vancomycin (VANCOCIN) 1,500 mg in sodium chloride 0.9 % 500 mL IVPB  Status:  Discontinued     1,500 mg 250 mL/hr over 120 Minutes Intravenous Every 24 hours 11/22/18 0358 11/22/18 0750   11/22/18 0115  vancomycin (VANCOCIN) 1,500 mg in sodium chloride 0.9 % 500 mL IVPB     1,500 mg 250 mL/hr over 120 Minutes Intravenous  Once 11/22/18 0101 11/22/18 0319   11/17/2018 1630  fluconazole (DIFLUCAN) IVPB 800 mg     800 mg 100 mL/hr over 240 Minutes Intravenous  Once 12/07/2018 1527 11/30/2018 2013   11/18/2018 1600  piperacillin-tazobactam (ZOSYN) IVPB 3.375 g  Status:  Discontinued     3.375 g 12.5 mL/hr over 240 Minutes Intravenous Every 8 hours 11/26/2018 1522 11/22/2018 0748   12/12/2018 0545  piperacillin-tazobactam (ZOSYN) IVPB 3.375 g     3.375 g 100 mL/hr over 30 Minutes Intravenous  Once 11/26/2018 0530 12/06/2018 0626   11/30/2018 0530  piperacillin-tazobactam (ZOSYN) IVPB 4.5 g  Status:  Discontinued     4.5 g 200 mL/hr over 30 Minutes Intravenous  Once 12/01/2018 0517 11/17/2018 0529       Assessment/Plan Perforatedsmall bowel with closed-loop obstruction  1.Exploratory laparotomy small bowel resection, lysis of adhesions, 1.5 hours, placement of wound VAC 12/13/2018 Dr. Coralie Keens 2.Reexploration of recent laparotomyWITH [facility] OUT6/9 Dr. Rosendo Gros 3. Exploratory laparotomy, washout and placement of wound VAC 11/22/2018 Dr. Marcello Moores [place] 4. Exploratory laparotomy with anastomosis of small bowel and closure of abdominal wall 12/05/2018 Dr. Marcello Moores [place] 5.Exploratory laparotomy, resection of previous anastomosis, wound VAC placement 11/22/2018 Dr. Autumn Messing 6. Reopening of recent laparotomy, small bowel resection, placement of wound VAC 12/14/2018, Dr. Dorris Fetch Byerly(FINDINGS:Pus throughout abdomen, though minimal succus. Bilious rind over intestines. Small hole on distal segment of small bowel with  small amount of bile) 7.EXPLORATORY LAPAROTOMY,WASH OUT OF ABDOMEN APPLICATION OF WOUND MPN(TIRWERX)10/17/84 Dr. Ninfa Linden 8.Exploratory laparotomy with abdominal washout,Small bowel anastomosis07/13/2020 Dr. Dema Severin Has small bowel anastomosisleak There are no further surgical options  Respiratory failure - on vent Acute renal failure - followed by Dr. Mickel Crow. CRRT stopped 7/13 as kidney function improved and patient making urine DM Severe malnutrition - Pre albumn - 9.3 on 12/22/2018 On TPN Anemia Hgb6(7/16) Code status DNR Seizure - 7/24, terminated with versed  FEN: N.p.o./TPN PY:PPJK currently DTO:IZTI, heparin held due to anemia Follow-up: To be determined POC: Mattier,George Brother [phone]  [phone]  Matier,Vickie    [phone]   Plan:I spoke with the patient's brother at bedside. Continuing current plan of care with no plans for escalation.     LOS: 52 days    Wellington Hampshire , [facility] 01/07/2019, 8:12 AM Pager: [phone] Mon-Thurs 7:00 am-4:30 pm Fri 7:00 am -11:30 AM Sat-Sun 7:00 am-11:30 am

## 2019-01-07 NOTE — Progress Notes (Signed)
Palliative Care   Following patient's care and transition to comfort care. Will maintain ventilator and provide full comfort without active withdrawal. Discussed with CCM NP who has spoken with family at length and plan is established. Please call our team if we can be of further assistance or support for the medical team or the patient and family.  Lane Hacker, DO Palliative Medicine   No Charge

## 2019-01-07 NOTE — Progress Notes (Signed)
[facility] Progress Note Patient Name: Carmen Stone DOB: 04/03/1956 MRN: [id number]   Date of Service  01/07/2019  HPI/Events of Note  Pt had a tonic-clonic seizure which was terminated by a dose of Versed.  eICU Interventions  Versed prn seizures, seizure precautions.        Frederik Pear 01/07/2019, 6:49 AM

## 2019-01-07 NOTE — Progress Notes (Signed)
At 0620 this RN noted the patient to be displaying rhythmic, tonic-clonic movements of the head and shoulders with her eyes open. The patient was non responsive to voice, pain, or visual stimulation. The patients oxygen saturations fell to 65% and her heart rate increased into the 160's. 2mg  of PRN versed was given and Elink notified. The rhythmic movements ceased shortly after medication administration and her HR and oxygen saturations returned to baseline. Family (brother, Iona Beard) was notified of the event and all questions were answered to his satisfaction. Will continue to monitor.

## 2019-01-07 NOTE — Consult Note (Signed)
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 Effluent is controlled. Day 11.  Will leave intact.   [place] nursing team will follow from a distance and check in periodically to see how pouch is holding up.  Supplies in room should a change be needed and WOC not available.  [place] nurse will remain available to this patient, the nursing and medical teams.  Thanks, Maudie Flakes, MSN, RN, [place], Arther Abbott  Pager# [phone]

## 2019-01-07 NOTE — Progress Notes (Signed)
NAME:  Carmen Stone, MRN:  [id number], DOB:  Feb 27, 1956, LOS: 79 ADMISSION DATE:  11/18/2018, CONSULTATION DATE:  11/21/2018 REFERRING MD:  Dr. Ninfa Linden, Surgery, CHIEF COMPLAINT:  Abdominal pain   Brief History   63 y/o female presented to ER 11/20/2018 with nausea, vomiting, abdominal pain.  CT abd/pelvis showed focal perforated loop of SB with free air with multiple complex ventral hernias.  Underwent emergent laparotomy with SB resection, lysis of adhesions and wound vac.  Remained on vent and pressors post op.  Hospital course complicated by respiratory arrest 6/13, multiple reintubations, intermittent vasopressor needs, AKI on CVVHD.     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, HTN, HLD, DM type II  Significant Hospital Events   6/07 Admit, to OR 6/08 transfuse FFP, on multiple pressors 6/09 To OR for wash out, start CRRT 6/13 Acute respiratory arrest overnight, off CRRT, on pressors, limit sedation 6/14 OR for bowel anastomosis and closure of abdominal wall 6/15 Changing volume goal on CRRT to -100 cc an hour. Weaned 7 hours PSV   6/16 Encephalopathic. Hypertensive, adding labetalol.  IV TNA started 6/18 Neuro status improved.  Required brief vasoactive drips.  Net negative fluid volume status.PSV 6/19 Extubated 6/20 Reintubated for altered mentation, head CT negative  6/23 Febrile overnight, Blood Cx.  6/25 L IJ, subclavian, axillary  + DVT. Heparin initiated. 6/26  CT ABD/pelvis positive for free air and ascites. Back to OR  for ex lap of perforated viscous, resection of previous anastomosis, application of wound vac. 1U RBCs intra op.   6/28 Back on pressors with hgb <7 / sedated on fent drip, PRBC 7/01 Less sedation, following commands, abd open with VAC, on CVVHD (even) 7/03 OR, unable to close but bowel "looks some better", hematoma in abd, on CVVHD 7/06 OR for laparotomy, washout, SB anastomosis  7/14 Febrile over night to 102.3,  Hypotensive 500 cc bolus and albumin, CVVHD d/c 7/15 no acute changes.  7/16 Hypotensive, anemia - 2 RBCs, fluid, and NE started 7/17: Febrile.  Off pressors.  Critical care team taking vasoactive drips off from medicine profile.  Nothing further to offer at this point.  Family still not on board with transition to comfort however agreeable to no escalation.  We will not add back therapies that do not help her 7/17: Family adamant about adding back norepinephrine.  This is in spite of strong recommendations against this from critical care service. 7/17 through 7/20: No other changes other than norepinephrine dependent 7/21 meeting w/ family and ethics  7/24 seizing. Versed gtt started w/ PRN. Family informed she is progressing into multi-organ failure and we will transition to comfort based focus.  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 CVL 6/09 >>  Significant Diagnostic Tests:  CT abd/pelvis 6/07 >> focal perforated loop of SB with free air with multiple complex ventral hernias Echo 6/08 >> EF 20%, Takotsubo CM CT head 6/20 >> negative for any acute findings CT abdomen 6/20 >> bilateral effusions, atelectasis left lower lobe, results noted.    CT Chest 6/20 >> small bilateral pleural effusions with compressive atelectasis, anterior R 4-7 rib fractures CT ABD/Pelvis 6/20 >> small bowel surgical changes, mild circumferential wall thickening of scattered small bowel loops, small to moderate ascites LUE Duplex 6/24 >>  DVT L IJ, subclavian, axillary, SVT L cephalic  CT ABD/Pelvis 9/83 >> large volume ascites with intraperitoneal free air   Micro Data:  COVID 6/07 >> negative Tracheal aspirate 6/20 >> E Coli sensitive to zosyn BCx2 6/23 >> negative BCx2  6/26 >> negative   Antimicrobials:  Zosyn 6/07 >> 6/17 Diflucan 6/07 >>  6/10 Cefepime 6/21 >>6/26 Vanco 6/21 >> 6/25 Anidulafungin 6/26 >> 7/06 Zosyn 6/26 >>7/10  Interim history/subjective:  Now seizing   Objective   Blood pressure 104/60, pulse (Abnormal) 58, temperature 99.3 F (37.4 C), temperature source Axillary, resp. rate 17, height 5\' 4"  (1.626 m), weight 67.5 kg, SpO2 100 %.    Vent Mode: PRVC FiO2 (%):  [30 %] 30 % Set Rate:  [18 bmp] 18 bmp Vt Set:  [430 mL] 430 mL PEEP:  [5 cmH20] 5 cmH20 Plateau Pressure:  [20 cmH20-25 cmH20] 21 cmH20   Intake/Output Summary (Last 24 hours) at 01/07/2019 0829 Last data filed at 01/07/2019 3825 Gross per 24 hour  Intake 2692.82 ml  Output 2180 ml  Net 512.82 ml   Filed Weights   12/25/18 0500 12/27/18 0500 01/04/19 0609  Weight: 65 kg 65.1 kg 67.5 kg   Physical Exam:  General 63 year female now seizing. Remains terminally ill in spite of all heroic measures.  HENT NCAT orally intubated. Does have temporal wasting pulm decreased t/o but clear Card RRR  abd purulent drainage from abd cavity no bowel sounds gu decreased UOP Neuro unresponsive.  W/ intermittent seizure.  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 I think this is a clear message to [place] about her further progression into multiple organ failure. She has associated worsening hypotension. I suspect d/t progressive acidosis. Her brother and daughter are at bedside. They have witnessed the seizures. They understand that this marks yet another indication of worsening failure and I have been very clear that I think she is unlikely to survive the day.  Plan  Continue ventilator support VAP bundle Fixed rate norepi w/out titration (already at ceiling) Cont fent gtt Add versed gtt w/ PRN for breakthrough seizure On-going family support.  We are now transitioning to comfort based focus. I am going to stop TNA.     Best practice:  Diet: TPN-->stop 7/24 DVT prophylaxis: SCDs GI prophylaxis: PPI Mobility: Bedrest Code Status: DO NOT RESUSCITATE Disposition cont ventilatory support. norepi capped. DNR. Anticipate she will die in next 24 hours. We will focus on her comfort and dignity. Family at bedside.   Erick Colace ACNP-BC [place] Pager # [phone] OR # [phone] if no answer

## 2019-01-08 ENCOUNTER — Inpatient Hospital Stay (HOSPITAL_COMMUNITY): Payer: BLUE CROSS/BLUE SHIELD

## 2019-01-08 NOTE — Progress Notes (Signed)
[facility] Progress Note  19 Days Post-Op  Subjective: CC-  Pt hypotensive overnight.  Fentanyl and versed gtt decreased.  Febrile.    Objective: Vital signs in last 24 hours: Temp:  [96 F (35.6 C)-101.2 F (38.4 C)] 99.2 F (37.3 C) (07/25 0800) Pulse Rate:  [95-122] 111 (07/25 0800) Resp:  [8-29] 20 (07/25 0800) BP: (51-98)/(20-53) 56/24 (07/25 0800) SpO2:  [93 %-100 %] 100 % (07/25 0800) FiO2 (%):  [30 %] 30 % (07/25 0600) Last BM Date: 01/06/19  Intake/Output from previous day: 07/24 0701 - 07/25 0700 In: 1808.5 [I.V.:1808.5] Out: 1331 [Urine:861; Emesis/NG output:250; Drains:220] Intake/Output this shift: Total I/O In: 49.5 [I.V.:49.5] Out: 0   PE: Gen:On the vent, not responsive.   Pulm:mechanically ventilated, synchronous with vent.   Cardio: tachy Abd: Soft,Eakins pouchto suction with good seal/feculent drainage Ext:2+ edema BLE/BUE Skin: warm and dry Neuro:does not open eyes for me this morningor follow any commands    Lab Results:  No results for input(s): WBC, HGB, HCT, PLT in the last 72 hours. BMET No results for input(s): NA, K, CL, CO2, GLUCOSE, BUN, CREATININE, CALCIUM in the last 72 hours. PT/INR No results for input(s): LABPROT, INR in the last 72 hours. CMP     Component Value Date/Time   NA 143 12/30/2018 0406   NA 138 06/01/2017 0859   K 3.0 (L) 12/30/2018 0406   K 4.2 06/01/2017 0859   CL 97 (L) 12/30/2018 0406   CO2 32 12/30/2018 0406   CO2 24 06/01/2017 0859   GLUCOSE 382 (H) 12/30/2018 0406   GLUCOSE 118 06/01/2017 0859   BUN 84 (H) 12/30/2018 0406   BUN 15.2 06/01/2017 0859   CREATININE 3.33 (H) 12/30/2018 0406   CREATININE 0.79 06/02/2018 0856   CREATININE 0.7 06/01/2017 0859   CALCIUM 7.7 (L) 12/30/2018 0406   CALCIUM 9.0 06/01/2017 0859   PROT 6.7 12/30/2018 0406   PROT 7.0 06/01/2017 0859   ALBUMIN 2.0 (L) 12/30/2018 0406   ALBUMIN 3.7 06/01/2017 0859   AST 24 12/30/2018 0406   AST 13 (L)  06/02/2018 0856   AST 14 06/01/2017 0859   ALT 23 12/30/2018 0406   ALT 16 06/02/2018 0856   ALT 12 06/01/2017 0859   ALKPHOS 199 (H) 12/30/2018 0406   ALKPHOS 97 06/01/2017 0859   BILITOT 0.4 12/30/2018 0406   BILITOT 0.3 06/02/2018 0856   BILITOT 0.22 06/01/2017 0859   GFRNONAA 14 (L) 12/30/2018 0406   GFRNONAA >60 06/02/2018 0856   GFRAA 16 (L) 12/30/2018 0406   GFRAA >60 06/02/2018 0856   Lipase     Component Value Date/Time   LIPASE 24 11/22/2018 0355       Studies/Results: No results found.  Anti-infectives: Anti-infectives (From admission, onward)   Start     Dose/Rate Route Frequency Ordered Stop   12/07/2018 1030  clindamycin (CLEOCIN) 900 mg, gentamicin (GARAMYCIN) 240 mg in sodium chloride 0.9 % 1,000 mL for intraperitoneal lavage      Irrigation To Surgery 11/17/2018 1026 11/24/2018 1042   12/11/18 1500  anidulafungin (ERAXIS) 100 mg in sodium chloride 0.9 % 100 mL IVPB  Status:  Discontinued     100 mg 78 mL/hr over 100 Minutes Intravenous Every 24 hours 11/18/2018 1007 12/15/2018 1103   12/11/18 0600  piperacillin-tazobactam (ZOSYN) IVPB 3.375 g  Status:  Discontinued     3.375 g 100 mL/hr over 30 Minutes Intravenous Every 6 hours 12/11/18 0317 12/24/18 0737   12/11/2018 1800  piperacillin-tazobactam (  ZOSYN) IVPB 2.25 g  Status:  Discontinued     2.25 g 100 mL/hr over 30 Minutes Intravenous Every 6 hours 11/30/2018 1413 12/11/18 0317   11/29/2018 1100  anidulafungin (ERAXIS) 200 mg in sodium chloride 0.9 % 200 mL IVPB     200 mg 78 mL/hr over 200 Minutes Intravenous  Once 11/22/2018 1007 12/08/2018 1456   11/15/2018 1100  piperacillin-tazobactam (ZOSYN) IVPB 3.375 g     3.375 g 100 mL/hr over 30 Minutes Intravenous  Once 11/15/2018 1027 12/02/2018 1245   12/08/18 1800  vancomycin (VANCOCIN) IVPB 750 mg/150 ml premix  Status:  Discontinued     750 mg 150 mL/hr over 60 Minutes Intravenous Every 48 hours 12/07/18 0822 12/07/18 0903   12/08/18 1000  ceFEPIme (MAXIPIME) 2 g in sodium  chloride 0.9 % 100 mL IVPB  Status:  Discontinued     2 g 200 mL/hr over 30 Minutes Intravenous Every 24 hours 12/07/18 0822 12/07/18 0903   12/07/18 2200  ceFEPIme (MAXIPIME) 2 g in sodium chloride 0.9 % 100 mL IVPB  Status:  Discontinued     2 g 200 mL/hr over 30 Minutes Intravenous Every 12 hours 12/07/18 0903 11/22/2018 1027   12/07/18 1800  vancomycin (VANCOCIN) IVPB 750 mg/150 ml premix  Status:  Discontinued     750 mg 150 mL/hr over 60 Minutes Intravenous Every 24 hours 12/07/18 0903 12/09/18 1040   12/06/18 1800  vancomycin (VANCOCIN) IVPB 750 mg/150 ml premix  Status:  Discontinued     750 mg 150 mL/hr over 60 Minutes Intravenous Every 24 hours 12/05/18 1652 12/07/18 0822   12/05/18 1800  ceFEPIme (MAXIPIME) 2 g in sodium chloride 0.9 % 100 mL IVPB  Status:  Discontinued     2 g 200 mL/hr over 30 Minutes Intravenous Every 12 hours 12/05/18 1644 12/07/18 0822   12/05/18 1700  vancomycin (VANCOCIN) 1,250 mg in sodium chloride 0.9 % 250 mL IVPB     1,250 mg 166.7 mL/hr over 90 Minutes Intravenous  Once 12/05/18 1652 12/05/18 2019   11/16/2018 1800  fluconazole (DIFLUCAN) IVPB 200 mg  Status:  Discontinued     200 mg 100 mL/hr over 60 Minutes Intravenous Every 24 hours 12/12/2018 0748 12/05/2018 0847   11/20/2018 1800  fluconazole (DIFLUCAN) IVPB 400 mg  Status:  Discontinued     400 mg 100 mL/hr over 120 Minutes Intravenous Every 24 hours 11/15/2018 0847 12/05/2018 0856   12/06/2018 1400  piperacillin-tazobactam (ZOSYN) IVPB 2.25 g  Status:  Discontinued     2.25 g 100 mL/hr over 30 Minutes Intravenous Every 6 hours 12/07/2018 0748 12/01/18 1104   11/22/2018 0600  cefoTEtan (CEFOTAN) 2 g in sodium chloride 0.9 % 100 mL IVPB     2 g 200 mL/hr over 30 Minutes Intravenous On call to O.R. 11/22/18 1300 11/26/2018 0624   11/22/18 1800  fluconazole (DIFLUCAN) IVPB 400 mg  Status:  Discontinued     400 mg 100 mL/hr over 120 Minutes Intravenous Every 24 hours 11/30/2018 1528 11/16/2018 0748   11/22/18 1800   vancomycin (VANCOCIN) 1,500 mg in sodium chloride 0.9 % 500 mL IVPB  Status:  Discontinued     1,500 mg 250 mL/hr over 120 Minutes Intravenous Every 24 hours 11/22/18 0358 11/22/18 0750   11/22/18 0115  vancomycin (VANCOCIN) 1,500 mg in sodium chloride 0.9 % 500 mL IVPB     1,500 mg 250 mL/hr over 120 Minutes Intravenous  Once 11/22/18 0101 11/22/18 0319   12/04/2018 1630  fluconazole (DIFLUCAN) IVPB 800 mg     800 mg 100 mL/hr over 240 Minutes Intravenous  Once 11/20/2018 1527 11/22/2018 2013   12/12/2018 1600  piperacillin-tazobactam (ZOSYN) IVPB 3.375 g  Status:  Discontinued     3.375 g 12.5 mL/hr over 240 Minutes Intravenous Every 8 hours 11/16/2018 1522 11/22/2018 0748   11/15/2018 0545  piperacillin-tazobactam (ZOSYN) IVPB 3.375 g     3.375 g 100 mL/hr over 30 Minutes Intravenous  Once 11/17/2018 0530 12/05/2018 0626   11/24/2018 0530  piperacillin-tazobactam (ZOSYN) IVPB 4.5 g  Status:  Discontinued     4.5 g 200 mL/hr over 30 Minutes Intravenous  Once 11/22/2018 0517 11/24/2018 0529       Assessment/Plan Perforatedsmall bowel with closed-loop obstruction  1.Exploratory laparotomy small bowel resection, lysis of adhesions, 1.5 hours, placement of wound VAC 12/06/2018 Dr. Coralie Keens 2.[facility] Dr. Rosendo Gros 3. Exploratory laparotomy, washout and placement of wound VAC 12/02/2018 Dr. Marcello Moores [place] 4. Exploratory laparotomy with anastomosis of small bowel and closure of abdominal wall 12/05/2018 Dr. Marcello Moores [place] 5.Exploratory laparotomy, resection of previous anastomosis, wound VAC placement 12/11/2018 Dr. Autumn Messing 6. Reopening of recent laparotomy, small bowel resection, placement of wound VAC 11/24/2018, Dr. Dorris Fetch Carmen Stone(FINDINGS:Pus throughout abdomen, though minimal succus. Bilious rind over intestines. Small hole on distal segment of small bowel with small amount of bile) 7.EXPLORATORY LAPAROTOMY,WASH OUT OF ABDOMEN APPLICATION OF WOUND  UXN([id number])2/0/2542 Dr. Ninfa Linden 8.Exploratory laparotomy with abdominal washout,Small bowel anastomosis07/26/2020 Dr. Dema Severin Has small bowel anastomosisleak There are no further surgical options  Respiratory failure - on vent Acute renal failure - followed by Dr. Mickel Crow. CRRT stopped 7/13 as kidney function improved and patient making urine DM Severe malnutrition - Pre albumn - 9.3 on 01/01/2019 On TPN Anemia Hgb6(7/16) Code status DNR Seizure - 7/24, terminated with versed  FEN: N.p.o./TPN HC:WCBJ currently SEG:BTDV, heparin held due to anemia Follow-up: To be determined POC: Carmen Stone,Carmen Stone [phone]  [phone]  Carmen Stone,Carmen Stone    [phone]   Plan:I spoke with the patient's daughter at bedside. Comfort care at this point.     LOS: 48 days    Stark Klein , [facility] 01/08/2019, 8:15 AM

## 2019-01-08 NOTE — Progress Notes (Signed)
NAME:  Carmen Stone, MRN:  [id number], DOB:  [birth year], LOS: 36 ADMISSION DATE:  11/22/2018, CONSULTATION DATE:  11/21/2018 REFERRING MD:  Dr. Ninfa Linden, Surgery, CHIEF COMPLAINT:  Abdominal pain   Brief History   63 y/o female presented to ER 11/22/2018 with nausea, vomiting, abdominal pain.  CT abd/pelvis showed focal perforated loop of SB with free air with multiple complex ventral hernias.  Underwent emergent laparotomy with SB resection, lysis of adhesions and wound vac.  Remained on vent and pressors post op.  Hospital course complicated by respiratory arrest 6/13, multiple reintubations, intermittent vasopressor needs, AKI on CVVHD.     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, HTN, HLD, DM type II  Significant Hospital Events   6/07 Admit, to OR 6/08 transfuse FFP, on multiple pressors 6/09 To OR for wash out, start CRRT 6/13 Acute respiratory arrest overnight, off CRRT, on pressors, limit sedation 6/14 OR for bowel anastomosis and closure of abdominal wall 6/15 Changing volume goal on CRRT to -100 cc an hour. Weaned 7 hours PSV   6/16 Encephalopathic. Hypertensive, adding labetalol.  IV TNA started 6/18 Neuro status improved.  Required brief vasoactive drips.  Net negative fluid volume status.PSV 6/19 Extubated 6/20 Reintubated for altered mentation, head CT negative  6/23 Febrile overnight, Blood Cx.  6/25 L IJ, subclavian, axillary  + DVT. Heparin initiated. 6/26  CT ABD/pelvis positive for free air and ascites. Back to OR  for ex lap of perforated viscous, resection of previous anastomosis, application of wound vac. 1U RBCs intra op.   6/28 Back on pressors with hgb <7 / sedated on fent drip, PRBC 7/01 Less sedation, following commands, abd open with VAC, on CVVHD (even) 7/03 OR, unable to close but bowel "looks some better", hematoma in abd, on CVVHD 7/06 OR for laparotomy, washout, SB anastomosis  7/14 Febrile over night to 102.3,  Hypotensive 500 cc bolus and albumin, CVVHD d/c 7/15 no acute changes.  7/16 Hypotensive, anemia - 2 RBCs, fluid, and NE started 7/17: Febrile.  Off pressors.  Critical care team taking vasoactive drips off from medicine profile.  Nothing further to offer at this point.  Family still not on board with transition to comfort however agreeable to no escalation.  We will not add back therapies that do not help her 7/17: Family adamant about adding back norepinephrine.  This is in spite of strong recommendations against this from critical care service. 7/17 through 7/20: No other changes other than norepinephrine dependent 7/21 meeting w/ family and ethics  7/24 seizing. Versed gtt started w/ PRN. Family informed she is progressing into multi-organ failure and we will transition to comfort based focus.  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    CT Chest 6/20 >> small bilateral pleural effusions with compressive atelectasis, anterior R 4-7 rib fractures CT ABD/Pelvis 6/20 >> small bowel surgical changes, mild circumferential wall thickening of scattered small bowel loops, small to moderate ascites LUE Duplex 6/24 >>  DVT L IJ, subclavian, axillary, SVT L cephalic  CT ABD/Pelvis 4/94 >> large volume ascites with intraperitoneal free air   Micro Data:  COVID 6/07 >> negative Tracheal aspirate 6/20 >> E Coli sensitive to zosyn BCx2 6/23 >> negative BCx2  6/26 >> negative   Antimicrobials:  Zosyn 6/07 >> 6/17 Diflucan 6/07 >>  6/10 Cefepime 6/21 >>6/26 Vanco 6/21 >> 6/25 Anidulafungin 6/26 >> 7/06 Zosyn 6/26 >>7/10  Interim history/subjective:  On versed gtt. Unchanged. No acute events or worsening sz overnight.   Objective   Blood pressure (!) 57/23, pulse (!) 108, temperature 99.7 F (37.6 C), temperature source Axillary, resp. rate 19, height 5\' 4"  (1.626 m), weight 67.5 kg, SpO2 100 %.    Vent Mode: PRVC FiO2 (%):  [30 %] 30 % Set Rate:  [18 bmp] 18 bmp Vt Set:  [430 mL] 430 mL PEEP:  [5 cmH20] 5 cmH20 Plateau Pressure:  [21 cmH20-23 cmH20] 21 cmH20   Intake/Output Summary (Last 24 hours) at 01/08/2019 0755 Last data filed at 01/08/2019 0700 Gross per 24 hour  Intake 1726.58 ml  Output 1331 ml  Net 395.58 ml   Filed Weights   12/25/18 0500 12/27/18 0500 01/04/19 0609  Weight: 65 kg 65.1 kg 67.5 kg   Physical Exam:  General 63 year female now seizing. Remains terminally ill in spite of all heroic measures.  HENT NCAT orally intubated. Does have temporal wasting pulm decreased t/o but clear Card RRR  abd purulent drainage from abd cavity no bowel sounds gu decreased UOP Neuro unresponsive.  W/ intermittent seizure.  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 I think this is a clear message to [place] about her further progression into multiple organ failure. She has associated worsening hypotension. I suspect d/t progressive acidosis. Her daughter is at bedside. They have witnessed the seizures. They understand that this marks yet another indication of worsening failure and I have been very clear that I think she  is unlikely to survive the day.  Plan Continue ventilator support VAP bundle Fixed rate norepi w/out titration (already at ceiling) Cont fent gtt Continue versed gtt w/ PRN for breakthrough seizure On-going family support.  We are now transitioning to comfort based focus. I am going to stop TNA.     Best practice:  Diet: TPN-->stop 7/24 DVT prophylaxis: SCDs GI prophylaxis: PPI Mobility: Bedrest Code Status: DO NOT RESUSCITATE Disposition cont ventilatory support. norepi capped. DNR. Anticipate she will die fairly soon.  We will focus on her comfort and dignity. Family at bedside.    I have independently seen and examined the patient, reviewed data, and developed an assessment and plan with the APP. A total of 36 minutes were spent in critical care assessment and medical decision making. This critical care time does not reflect procedure time, or teaching time or supervisory time of PA/NP/Med student/Med Resident, etc but could involve care discussion time.  Bonna Gains, MD PhD 01/08/19 7:57 AM

## 2019-01-08 NOTE — Progress Notes (Signed)
Wasted 6 mL of Versed in Stericycle with Theodosia Quay, RN.

## 2019-01-15 NOTE — Progress Notes (Addendum)
Pt asystole at 1625. Verified by myself and Lars Masson, RN. Family at bedside. Emotional support provided. Attending surgeon and critical care MD notified.

## 2019-01-15 NOTE — Progress Notes (Signed)
NAME:  DESSIRE GRIMES, MRN:  [id number], DOB:  04-25-1956, LOS: 57 ADMISSION DATE:  12/11/2018, CONSULTATION DATE:  11/21/2018 REFERRING MD:  Dr. Ninfa Linden, Surgery, CHIEF COMPLAINT:  Abdominal pain   Brief History   63 y/o female presented to ER 11/22/2018 with nausea, vomiting, abdominal pain.  CT abd/pelvis showed focal perforated loop of SB with free air with multiple complex ventral hernias.  Underwent emergent laparotomy with SB resection, lysis of adhesions and wound vac.  Remained on vent and pressors post op.  Hospital course complicated by respiratory arrest 6/13, multiple reintubations, intermittent vasopressor needs, AKI on CVVHD.     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, HTN, HLD, DM type II  Significant Hospital Events   6/07 Admit, to OR 6/08 transfuse FFP, on multiple pressors 6/09 To OR for wash out, start CRRT 6/13 Acute respiratory arrest overnight, off CRRT, on pressors, limit sedation 6/14 OR for bowel anastomosis and closure of abdominal wall 6/15 Changing volume goal on CRRT to -100 cc an hour. Weaned 7 hours PSV   6/16 Encephalopathic. Hypertensive, adding labetalol.  IV TNA started 6/18 Neuro status improved.  Required brief vasoactive drips.  Net negative fluid volume status.PSV 6/19 Extubated 6/20 Reintubated for altered mentation, head CT negative  6/23 Febrile overnight, Blood Cx.  6/25 L IJ, subclavian, axillary  + DVT. Heparin initiated. 6/26  CT ABD/pelvis positive for free air and ascites. Back to OR  for ex lap of perforated viscous, resection of previous anastomosis, application of wound vac. 1U RBCs intra op.   6/28 Back on pressors with hgb <7 / sedated on fent drip, PRBC 7/01 Less sedation, following commands, abd open with VAC, on CVVHD (even) 7/03 OR, unable to close but bowel "looks some better", hematoma in abd, on CVVHD 7/06 OR for laparotomy, washout, SB anastomosis  7/14 Febrile over night to 102.3,  Hypotensive 500 cc bolus and albumin, CVVHD d/c 7/15 no acute changes.  7/16 Hypotensive, anemia - 2 RBCs, fluid, and NE started 7/17: Febrile.  Off pressors.  Critical care team taking vasoactive drips off from medicine profile.  Nothing further to offer at this point.  Family still not on board with transition to comfort however agreeable to no escalation.  We will not add back therapies that do not help her 7/17: Family adamant about adding back norepinephrine.  This is in spite of strong recommendations against this from critical care service. 7/17 through 7/20: No other changes other than norepinephrine dependent 7/21 meeting w/ family and ethics  7/24 seizing. Versed gtt started w/ PRN. Family informed she is progressing into multi-organ failure and we will transition to comfort based focus.  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    CT Chest 6/20 >> small bilateral pleural effusions with compressive atelectasis, anterior R 4-7 rib fractures CT ABD/Pelvis 6/20 >> small bowel surgical changes, mild circumferential wall thickening of scattered small bowel loops, small to moderate ascites LUE Duplex 6/24 >>  DVT L IJ, subclavian, axillary, SVT L cephalic  CT ABD/Pelvis 5/46 >> large volume ascites with intraperitoneal free air   Micro Data:  COVID 6/07 >> negative Tracheal aspirate 6/20 >> E Coli sensitive to zosyn BCx2 6/23 >> negative BCx2  6/26 >> negative   Antimicrobials:  Zosyn 6/07 >> 6/17 Diflucan 6/07 >>  6/10 Cefepime 6/21 >>6/26 Vanco 6/21 >> 6/25 Anidulafungin 6/26 >> 7/06 Zosyn 6/26 >>7/10  Interim history/subjective:  On versed gtt. Unchanged. No acute events or worsening sz overnight. HR slowing somewhat this a.m  Objective   Blood pressure (!) 66/29, pulse 90, temperature (!) 97.5 F (36.4 C), temperature source Axillary, resp. rate 17, height 5\' 4"  (1.626 m), weight 67.5 kg, SpO2 98 %.    Vent Mode: PRVC FiO2 (%):  [30 %] 30 % Set Rate:  [18 bmp] 18 bmp Vt Set:  [430 mL] 430 mL PEEP:  [5 cmH20] 5 cmH20 Plateau Pressure:  [23 cmH20-27 cmH20] 25 cmH20   Intake/Output Summary (Last 24 hours) at Jan 20, 2019 0818 Last data filed at 2019-01-20 0700 Gross per 24 hour  Intake 1021.82 ml  Output 1020 ml  Net 1.82 ml   Filed Weights   12/25/18 0500 12/27/18 0500 01/04/19 0609  Weight: 65 kg 65.1 kg 67.5 kg   Physical Exam:  General 63 year female. Remains terminally ill in spite of all measures.  HENT NCAT orally intubated. Does have temporal wasting pulm decreased t/o but clear Card RRR  abd purulent drainage from abd cavity no bowel sounds gu decreased UOP Neuro unresponsive.  W/ intermittent seizure.  Ext warm no sig edema   Resolved Hospital Problem list     Assessment & Plan:   Ventilator dependence w/ failure to wean  in setting of prolonged critical illness, severe protein calorie malnutrition, metabolic encephalopathy and on-going peritonitis Abd peritonitis 2/2 anastomotic leak with recurrent septic shock Acute renal failure  Anemia of critical illness Severe Protein Malnutrition Seizure   Discussion Progression into multiple organ failure. She has associated worsening hypotension, seizures likely related to hypoperfusion,  progressive acidosis. Her daughter is at bedside. They understand that this marks yet another indication of worsening failure.  Plan Continue ventilator support VAP bundle Fixed rate norepi w/out titration (already at ceiling) Cont  fent gtt Continue versed gtt w/ PRN for breakthrough seizure On-going family support.  We are now transitioning to comfort based focus. I am going to stop TNA.     Best practice:  Diet: TPN-->stop 7/24 DVT prophylaxis: SCDs GI prophylaxis: PPI Mobility: Bedrest Code Status: DO NOT RESUSCITATE Disposition cont ventilatory support. norepi capped. DNR. Anticipate she will die fairly soon.  Family at bedside and had a conversation this a.m..    I have independently seen and examined the patient, reviewed data, and developed an assessment and plan. A total of 32 minutes were spent in critical care assessment and medical decision making. This critical care time does not reflect procedure time, or teaching time or supervisory time of PA/NP/Med student/Med Resident, etc but could involve care discussion time.  Bonna Gains, MD PhD January 20, 2019 8:30 AM

## 2019-01-15 NOTE — Progress Notes (Signed)
[facility] Progress Note  20 Days Post-Op  Subjective: CC-  Remains hypotensive.      Objective: Vital signs in last 24 hours: Temp:  [97.5 F (36.4 C)-100.4 F (38 C)] 97.5 F (36.4 C) (07/26 0748) Pulse Rate:  [110-120] 120 (07/25 1700) Resp:  [17-27] 17 (07/26 0600) BP: (54-79)/(17-44) 65/17 (07/26 0600) SpO2:  [99 %-100 %] 100 % (07/26 0355) FiO2 (%):  [30 %] 30 % (07/26 0400) Last BM Date: 01/08/19  Intake/Output from previous day: 07/25 0701 - 07/26 0700 In: 1071.3 [I.V.:1071.3] Out: 1020 [Urine:275; Emesis/NG output:420; Drains:325] Intake/Output this shift: No intake/output data recorded.  PE: Gen:On the vent, not responsive.   Pulm:mechanically ventilated, synchronous with vent on full support. Cardio: tachy Abd: Soft,Eakins pouchto suction with good seal/feculent drainage Skin: warm and dry Neuro:does not follow commands.  Lab Results:  No results for input(s): WBC, HGB, HCT, PLT in the last 72 hours. BMET No results for input(s): NA, K, CL, CO2, GLUCOSE, BUN, CREATININE, CALCIUM in the last 72 hours. PT/INR No results for input(s): LABPROT, INR in the last 72 hours. CMP     Component Value Date/Time   NA 143 12/30/2018 0406   NA 138 06/01/2017 0859   K 3.0 (L) 12/30/2018 0406   K 4.2 06/01/2017 0859   CL 97 (L) 12/30/2018 0406   CO2 32 12/30/2018 0406   CO2 24 06/01/2017 0859   GLUCOSE 382 (H) 12/30/2018 0406   GLUCOSE 118 06/01/2017 0859   BUN 84 (H) 12/30/2018 0406   BUN 15.2 06/01/2017 0859   CREATININE 3.33 (H) 12/30/2018 0406   CREATININE 0.79 06/02/2018 0856   CREATININE 0.7 06/01/2017 0859   CALCIUM 7.7 (L) 12/30/2018 0406   CALCIUM 9.0 06/01/2017 0859   PROT 6.7 12/30/2018 0406   PROT 7.0 06/01/2017 0859   ALBUMIN 2.0 (L) 12/30/2018 0406   ALBUMIN 3.7 06/01/2017 0859   AST 24 12/30/2018 0406   AST 13 (L) 06/02/2018 0856   AST 14 06/01/2017 0859   ALT 23 12/30/2018 0406   ALT 16 06/02/2018 0856   ALT 12  06/01/2017 0859   ALKPHOS 199 (H) 12/30/2018 0406   ALKPHOS 97 06/01/2017 0859   BILITOT 0.4 12/30/2018 0406   BILITOT 0.3 06/02/2018 0856   BILITOT 0.22 06/01/2017 0859   GFRNONAA 14 (L) 12/30/2018 0406   GFRNONAA >60 06/02/2018 0856   GFRAA 16 (L) 12/30/2018 0406   GFRAA >60 06/02/2018 0856   Lipase     Component Value Date/Time   LIPASE 24 12/03/2018 0355       Studies/Results: Dg Chest 1 View  Result Date: 01/08/2019 CLINICAL DATA:  Tube verification. EXAM: CHEST  1 VIEW COMPARISON:  Radiographs 01/11/2019 and 12/18/2018. FINDINGS: 0956 hours. The carina is not well visualized on the current study, although the tip of the endotracheal tube is slightly lower than on the previous study, approximately 2 cm above the carina. Enteric tube projects below the diaphragm. Bilateral central venous catheters extend to the lower SVC level. The heart size and mediastinal contours are stable. There are new bilateral pleural effusions with associated bibasilar atelectasis. No edema, confluent airspace opacity or pneumothorax. Surgical clips are present in the right axilla. IMPRESSION: 1. Support system position as above. Tip of the endotracheal tube appears slightly low and could be withdrawn 3-4 cm for more optimal positioning. 2. New bilateral pleural effusions with associated bibasilar atelectasis. Electronically Signed   By: Richardean Sale M.D.   On: 01/08/2019 11:52  Anti-infectives: Anti-infectives (From admission, onward)   Start     Dose/Rate Route Frequency Ordered Stop   12/01/2018 1030  clindamycin (CLEOCIN) 900 mg, gentamicin (GARAMYCIN) 240 mg in sodium chloride 0.9 % 1,000 mL for intraperitoneal lavage      Irrigation To Surgery 11/20/2018 1026 11/17/2018 1042   12/11/18 1500  anidulafungin (ERAXIS) 100 mg in sodium chloride 0.9 % 100 mL IVPB  Status:  Discontinued     100 mg 78 mL/hr over 100 Minutes Intravenous Every 24 hours 12/07/2018 1007 01/08/2019 1103   12/11/18 0600   piperacillin-tazobactam (ZOSYN) IVPB 3.375 g  Status:  Discontinued     3.375 g 100 mL/hr over 30 Minutes Intravenous Every 6 hours 12/11/18 0317 12/24/18 0737   11/22/2018 1800  piperacillin-tazobactam (ZOSYN) IVPB 2.25 g  Status:  Discontinued     2.25 g 100 mL/hr over 30 Minutes Intravenous Every 6 hours 11/26/2018 1413 12/11/18 0317   12/14/2018 1100  anidulafungin (ERAXIS) 200 mg in sodium chloride 0.9 % 200 mL IVPB     200 mg 78 mL/hr over 200 Minutes Intravenous  Once 11/19/2018 1007 12/03/2018 1456   11/22/2018 1100  piperacillin-tazobactam (ZOSYN) IVPB 3.375 g     3.375 g 100 mL/hr over 30 Minutes Intravenous  Once 11/17/2018 1027 11/22/2018 1245   12/08/18 1800  vancomycin (VANCOCIN) IVPB 750 mg/150 ml premix  Status:  Discontinued     750 mg 150 mL/hr over 60 Minutes Intravenous Every 48 hours 12/07/18 0822 12/07/18 0903   12/08/18 1000  ceFEPIme (MAXIPIME) 2 g in sodium chloride 0.9 % 100 mL IVPB  Status:  Discontinued     2 g 200 mL/hr over 30 Minutes Intravenous Every 24 hours 12/07/18 0822 12/07/18 0903   12/07/18 2200  ceFEPIme (MAXIPIME) 2 g in sodium chloride 0.9 % 100 mL IVPB  Status:  Discontinued     2 g 200 mL/hr over 30 Minutes Intravenous Every 12 hours 12/07/18 0903 11/24/2018 1027   12/07/18 1800  vancomycin (VANCOCIN) IVPB 750 mg/150 ml premix  Status:  Discontinued     750 mg 150 mL/hr over 60 Minutes Intravenous Every 24 hours 12/07/18 0903 12/09/18 1040   12/06/18 1800  vancomycin (VANCOCIN) IVPB 750 mg/150 ml premix  Status:  Discontinued     750 mg 150 mL/hr over 60 Minutes Intravenous Every 24 hours 12/05/18 1652 12/07/18 0822   12/05/18 1800  ceFEPIme (MAXIPIME) 2 g in sodium chloride 0.9 % 100 mL IVPB  Status:  Discontinued     2 g 200 mL/hr over 30 Minutes Intravenous Every 12 hours 12/05/18 1644 12/07/18 0822   12/05/18 1700  vancomycin (VANCOCIN) 1,250 mg in sodium chloride 0.9 % 250 mL IVPB     1,250 mg 166.7 mL/hr over 90 Minutes Intravenous  Once 12/05/18 1652  12/05/18 2019   11/30/2018 1800  fluconazole (DIFLUCAN) IVPB 200 mg  Status:  Discontinued     200 mg 100 mL/hr over 60 Minutes Intravenous Every 24 hours 11/26/2018 0748 12/08/2018 0847   11/26/2018 1800  fluconazole (DIFLUCAN) IVPB 400 mg  Status:  Discontinued     400 mg 100 mL/hr over 120 Minutes Intravenous Every 24 hours 12/11/2018 0847 11/15/2018 0856   11/30/2018 1400  piperacillin-tazobactam (ZOSYN) IVPB 2.25 g  Status:  Discontinued     2.25 g 100 mL/hr over 30 Minutes Intravenous Every 6 hours 11/22/2018 0748 12/01/18 1104   11/19/2018 0600  cefoTEtan (CEFOTAN) 2 g in sodium chloride 0.9 % 100 mL IVPB  2 g 200 mL/hr over 30 Minutes Intravenous On call to O.R. 11/22/18 1300 12/08/2018 0624   11/22/18 1800  fluconazole (DIFLUCAN) IVPB 400 mg  Status:  Discontinued     400 mg 100 mL/hr over 120 Minutes Intravenous Every 24 hours 12/11/2018 1528 12/08/2018 0748   11/22/18 1800  vancomycin (VANCOCIN) 1,500 mg in sodium chloride 0.9 % 500 mL IVPB  Status:  Discontinued     1,500 mg 250 mL/hr over 120 Minutes Intravenous Every 24 hours 11/22/18 0358 11/22/18 0750   11/22/18 0115  vancomycin (VANCOCIN) 1,500 mg in sodium chloride 0.9 % 500 mL IVPB     1,500 mg 250 mL/hr over 120 Minutes Intravenous  Once 11/22/18 0101 11/22/18 0319   11/26/2018 1630  fluconazole (DIFLUCAN) IVPB 800 mg     800 mg 100 mL/hr over 240 Minutes Intravenous  Once 12/11/2018 1527 12/09/2018 2013   11/18/2018 1600  piperacillin-tazobactam (ZOSYN) IVPB 3.375 g  Status:  Discontinued     3.375 g 12.5 mL/hr over 240 Minutes Intravenous Every 8 hours 12/01/2018 1522 11/22/2018 0748   11/22/2018 0545  piperacillin-tazobactam (ZOSYN) IVPB 3.375 g     3.375 g 100 mL/hr over 30 Minutes Intravenous  Once 11/19/2018 0530 12/11/2018 0626   11/24/2018 0530  piperacillin-tazobactam (ZOSYN) IVPB 4.5 g  Status:  Discontinued     4.5 g 200 mL/hr over 30 Minutes Intravenous  Once 12/01/2018 0517 11/20/2018 0529       Assessment/Plan Perforatedsmall bowel with  closed-loop obstruction  1.Exploratory laparotomy small bowel resection, lysis of adhesions, 1.5 hours, placement of wound VAC 11/18/2018 Dr. Coralie Keens 2.[facility] Dr. Rosendo Gros 3. Exploratory laparotomy, washout and placement of wound VAC 12/04/2018 Dr. Marcello Moores Cornett 4. Exploratory laparotomy with anastomosis of small bowel and closure of abdominal wall 12/02/2018 Dr. Marcello Moores Cornett 5.Exploratory laparotomy, resection of previous anastomosis, wound VAC placement 12/09/2018 Dr. Autumn Messing 6. Reopening of recent laparotomy, small bowel resection, placement of wound VAC 12/09/2018, Dr. Dorris Fetch Carmen Stone(FINDINGS:Pus throughout abdomen, though minimal succus. Bilious rind over intestines. Small hole on distal segment of small bowel with small amount of bile) 7.EXPLORATORY LAPAROTOMY,WASH OUT OF ABDOMEN APPLICATION OF WOUND ZJQ(BHALPFX)9/0/2409 Dr. Ninfa Linden 8.Exploratory laparotomy with abdominal washout,Small bowel anastomosis07/20/2020 Dr. Dema Severin Has small bowel anastomosisleak There are no further surgical options  Respiratory failure - on vent Acute renal failure - followed by Dr. Mickel Crow. CRRT stopped 7/13 as kidney function improved and patient making urine DM Severe malnutrition - Pre albumn - 9.3 on 12/16/2018 TNA stopped due to comfort care only. Anemia Hgb6(7/16) Code status DNR Seizure - 7/24, terminated with versed  FEN: N.p.o./TPN BD:ZHGD currently JME:QAST, heparin held due to anemia Follow-up: To be determined POC: Carmen Stone,Carmen Stone Brother [phone]  [phone]  Carmen Stone,Carmen Stone    [phone]   Plan:comfort care.     LOS: 49 days    Stark Klein , [facility] 2019-01-29, 7:55 AM

## 2019-01-15 NOTE — Progress Notes (Signed)
Chaplain visited bedside with family. Nature of visit was end of life. Prayer provided

## 2019-01-15 DEATH — deceased

## 2019-02-15 NOTE — Discharge Summary (Signed)
[facility] Death Summary   Patient ID: Carmen Stone MRN: [id number] DOB/AGE: 08-05-1955 63 y.o.  Admit date: November 22, 2018 Date/time of death: 10-Jan-2019 at 32  Admitting Diagnosis: Acute abdomen with perforated bowel and peritonitis  Consultants CCM Cardiology Nephrology Palliative care  Imaging: No results found.  Procedures 1.Exploratory laparotomy small bowel resection, lysis of adhesions, 1.5 hours, placement of wound VAC 11/22/2018 Dr. Coralie Keens 2.Reexploration of recent laparotomyWITH [facility]9 Dr. Rosendo Gros 3. Exploratory laparotomy, washout and placement of wound VAC 12/05/2018 Dr. Marcello Moores Cornett 4. Exploratory laparotomy with anastomosis of small bowel and closure of abdominal wall 12/03/2018 Dr. Marcello Moores Cornett 5.Exploratory laparotomy, resection of previous anastomosis, wound VAC placement 11/26/2018 Dr. Autumn Messing 6. Reopening of recent laparotomy, small bowel resection, placement of wound VAC 12/07/2018, Dr. Stark Klein 7.EXPLORATORY LAPAROTOMY,WASH OUT OF ABDOMEN APPLICATION OF WOUND JOI(NOMVEHM)0/02/4708 Dr. Ninfa Linden 8.Exploratory laparotomy with abdominal washout,Small bowel anastomosis7/11/2018 Dr. Rollene Fare Course:  Carmen Stone is a 63yo female PMH HTN, HLD, DM, and h/o inflammatory breast cancer who presented to [facility] Nov 22, 2022 with severe abdominal pain, nausea, and vomiting.  She underwent a CT scan of her abdomen and pelvis and was found to have perforated small bowel with a large amount of free air as well as multiple incarcerated incisional hernias.  She is tachycardic and in distress.  She is minimally conversant secondary to discomfort. Patient was taken emergently to the operating room for exploratory laparotomy, small bowel resection, lysis of adhesions, and placement open abdominal wound vac. She was admitted to the ICU postoperatively on the ventilator and pressor support. Critical care was consulted for assistance  with medical management. Cardiology was consulted for new onset systolic heart failure and nephrology for acute renal failure requiring CRRT.  She underwent two additional exploratory laparotomies in the operating room, and on 12/11/2018 she underwent exploratory laparotomy with anastomosis of small bowel and closure of abdominal wall. Unfortunately the anastomosis leaked and patient was taken back to the operating room for exploratory laparotomy with resection of previous anastomosis on 12/09/2018. Her abdomen was left open to allow for two additional exploratory laparotomies with wash out and bowel resection. Palliative care was consulted 6/30 for assistance with establishing goals of care. On 7/6 she was taken back to the operating room for an eighth time and underwent exploratory laparotomy with abdominal washout and small bowel anastomosis.On 7/8 she was noted to have succus draining from her midline wound most likely from a small bowel anastomosis leak. Despite multiple operations she remained critically ill, and further operations were deemed futile. Her hospital course was complicated by respiratory arrest 6/13, multiple reintubations, intermittent vasopressor needs, AKI on CRRT, and progression into multi-system organ failure. Multiple discussions were held between family and palliative care to update goals of care. Code status was changed to DNR. On 7/24 it was decided to maintain ventilator and provide full comfort without active withdrawal. On January 10, 2019 at 1625 the patient succumbed to her illness and passed away.    Physical Exam: See Dr. Marlowe Aschoff physical exam from progress note 2019/01/10    Signed: Wellington Hampshire, [facility] 01/19/2019, 12:33 PM Pager: [phone] Mon-Thurs 7:00 am-4:30 pm Fri 7:00 am -11:30 AM Sat-Sun 7:00 am-11:30 am

## 2019-03-06 NOTE — Addendum Note (Signed)
Addendum  created 03/06/19 1400 by Donnella Bi, RN   Intraprocedure Staff edited

## 2019-04-06 LAB — BLOOD GAS, ARTERIAL
Acid-base deficit: 15.8 mmol/L — ABNORMAL HIGH (ref 0.0–2.0)
Bicarbonate: 11.1 mmol/L — ABNORMAL LOW (ref 20.0–28.0)
Drawn by: 257881
FIO2: 40
MECHVT: 430 mL
O2 Saturation: 95.1 %
PEEP: 5 cmH2O
Patient temperature: 98.6
RATE: 22 resp/min
pCO2 arterial: 30 mmHg — ABNORMAL LOW (ref 32.0–48.0)
pH, Arterial: 7.195 — CL (ref 7.350–7.450)
pO2, Arterial: 90 mmHg (ref 83.0–108.0)

## 2019-06-06 ENCOUNTER — Ambulatory Visit: Payer: BLUE CROSS/BLUE SHIELD | Admitting: Nurse Practitioner

## 2019-06-06 ENCOUNTER — Other Ambulatory Visit: Payer: BLUE CROSS/BLUE SHIELD

## 2021-05-24 IMAGING — CT CT ABDOMEN AND PELVIS WITH CONTRAST
2 of 5 series · 15 of 46 positions shown, 17 images · IV contrast (ISOVUE)
Comparison: Acute abdominal series earlier same day

CLINICAL DATA: Patient with abdominal pain, nausea and vomiting.

EXAM:
CT ABDOMEN AND PELVIS WITH CONTRAST
TECHNIQUE: Multidetector CT imaging of the abdomen and pelvis was performed
using the standard protocol following bolus administration of
intravenous contrast.
CONTRAST:  100mL OMNIPAQUE IOHEXOL 300 MG/ML  SOLN

[Series 2: axial st · axial · 0.82mm/px · z∈[+938,+1308]mm · 12 of 86 slices shown, 14 images]
[im 6/86  soft-tissue]
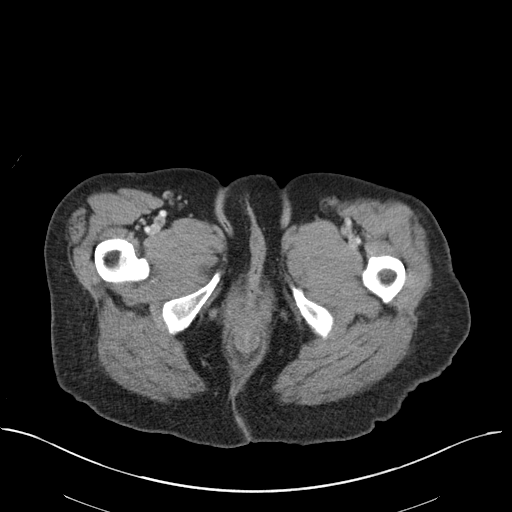
[im 6/86  bone]
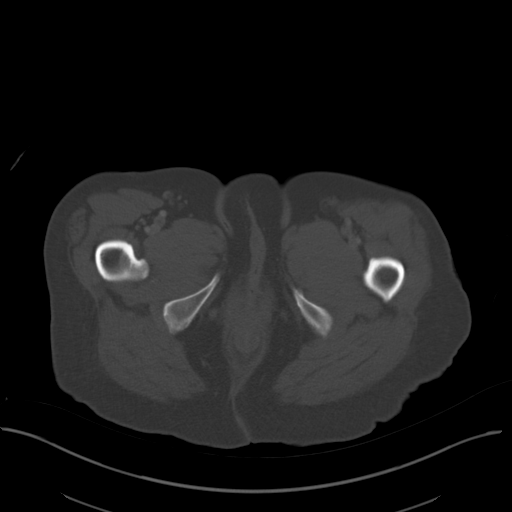
[im 11/86  soft-tissue]
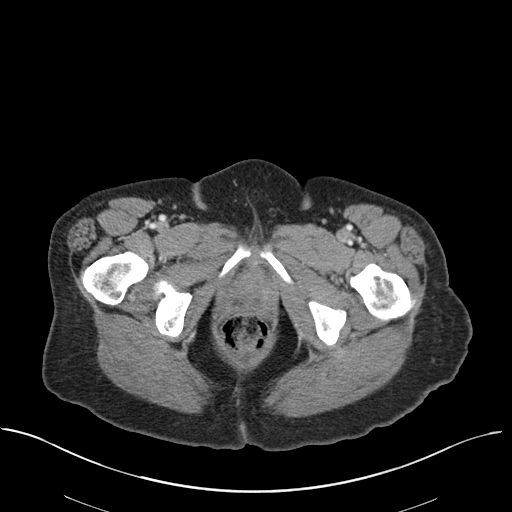
[im 22/86  soft-tissue]
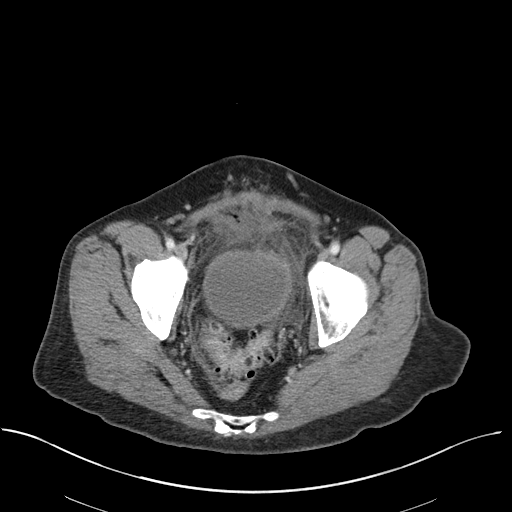
[im 27/86  soft-tissue]
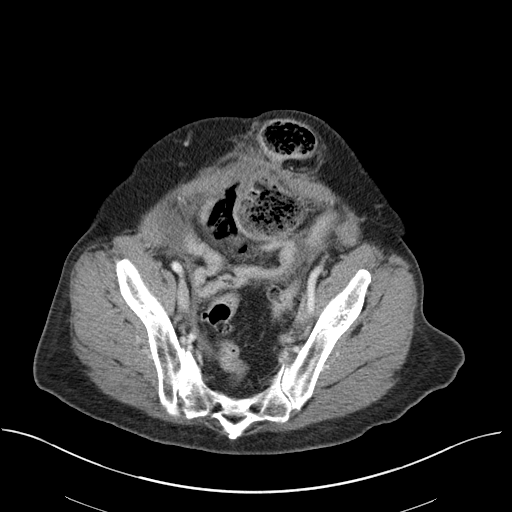
[im 32/86  soft-tissue]
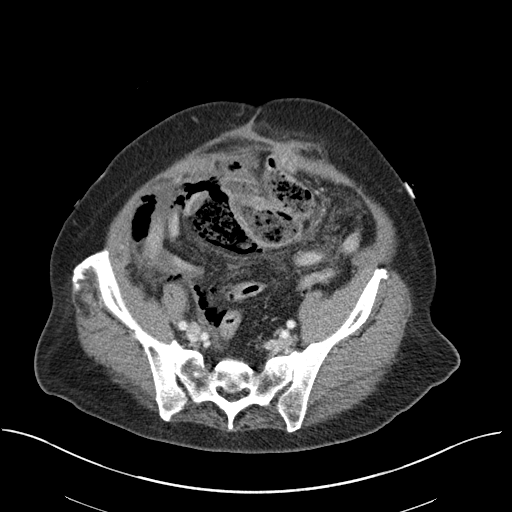
[im 38/86  soft-tissue]
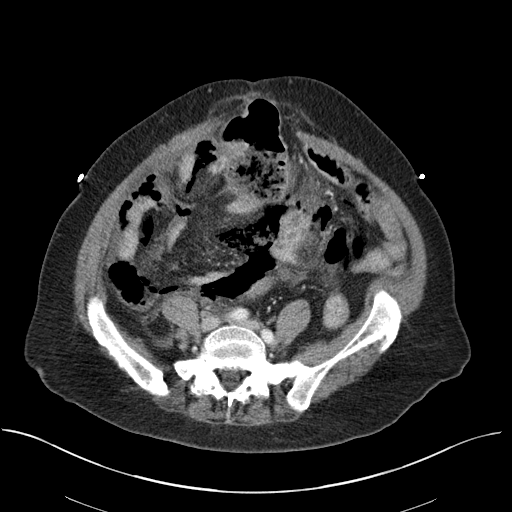
[im 48/86  soft-tissue]
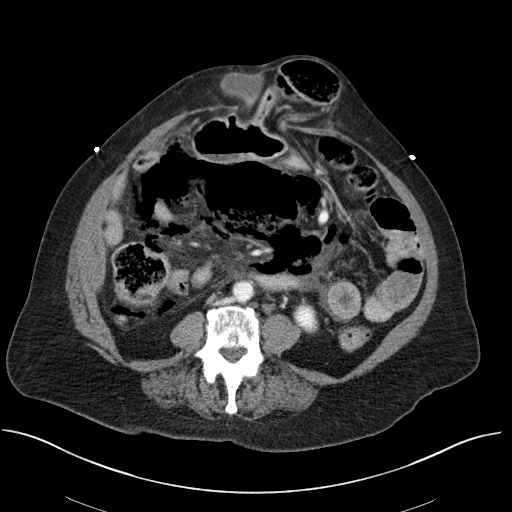
[im 54/86  soft-tissue]
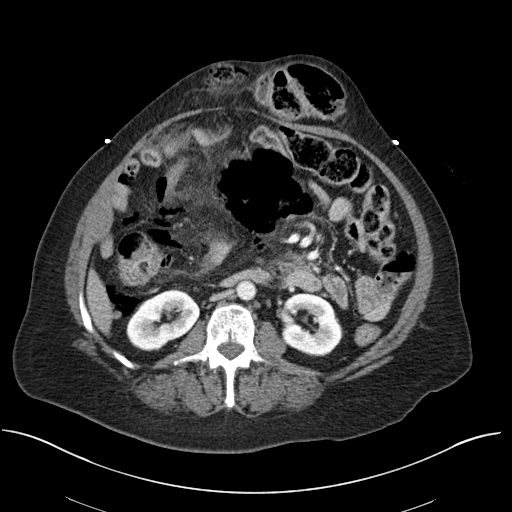
[im 59/86  soft-tissue]
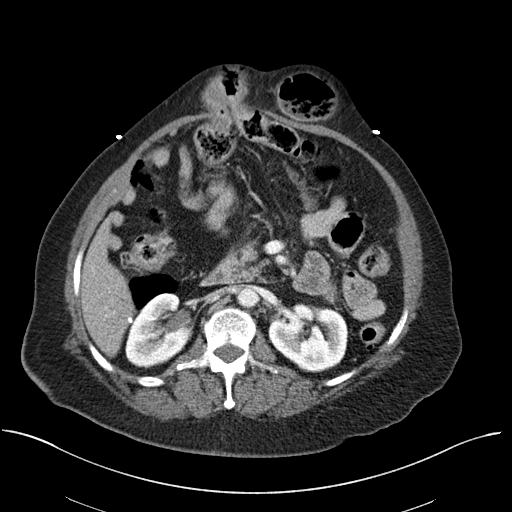
[im 59/86  bone]
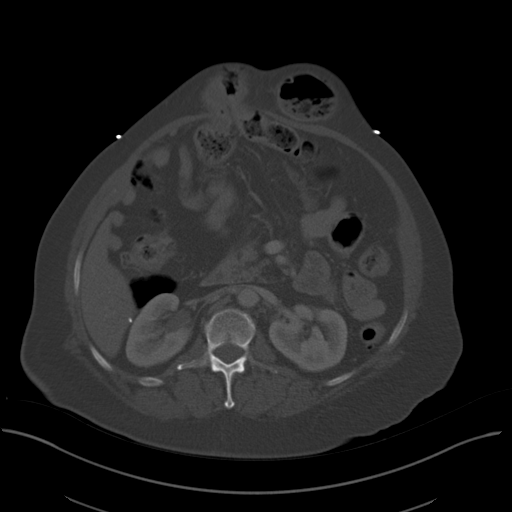
[im 64/86  soft-tissue]
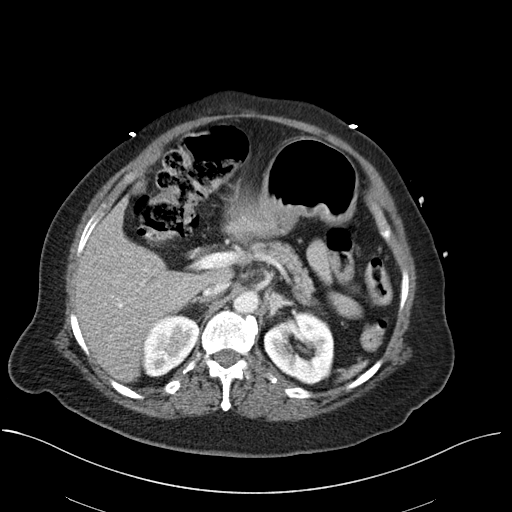
[im 75/86  soft-tissue]
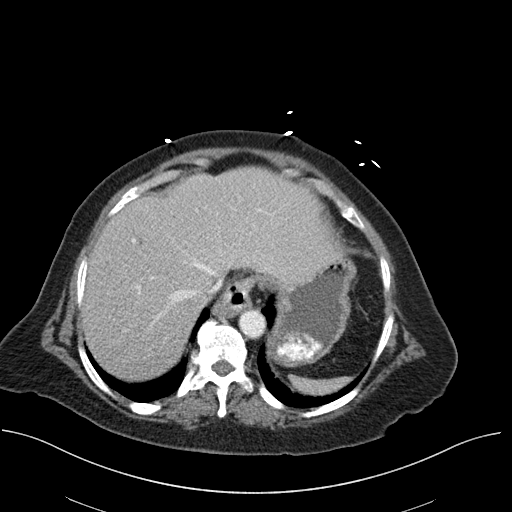
[im 80/86  soft-tissue]
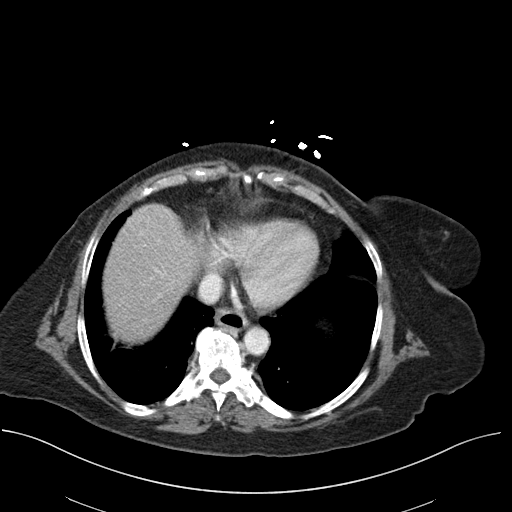

[Series 5: coronal st · coronal · 0.74mm/px · 3 of 160 slices shown]
[im 54/160  soft-tissue]
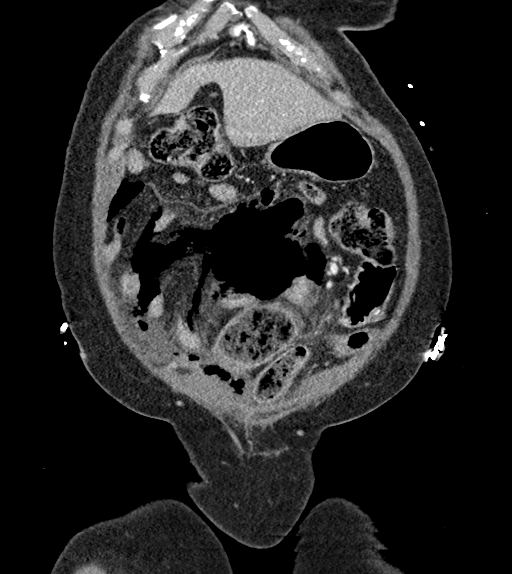
[im 71/160  soft-tissue]
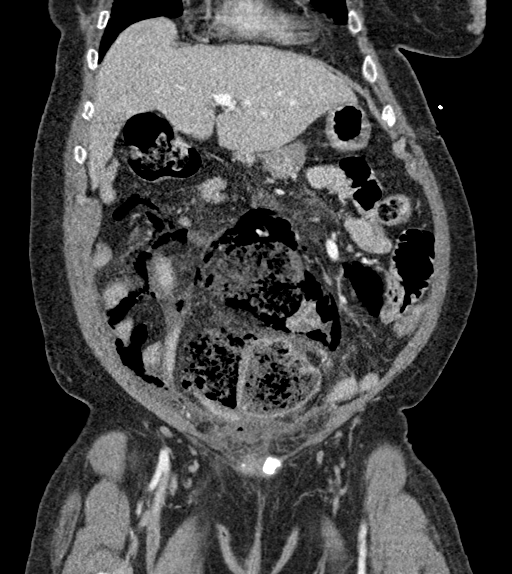
[im 89/160  soft-tissue]
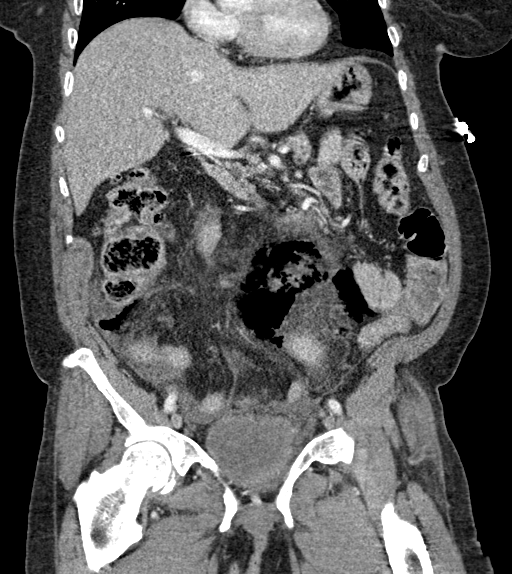

[15 of 46 positions shown; findings below may reference images not displayed]

FINDINGS: Lower chest: Normal heart size. Dependent atelectasis within the
bilateral lower lobes.

Hepatobiliary: The liver is normal in size and contour.
Subcentimeter too small to characterize low-attenuation lesion right
hepatic lobe (image 22; series 2). Gallbladder surgically absent.

Pancreas: Unremarkable

Spleen: Unremarkable

Adrenals/Urinary Tract: Normal adrenal glands. Kidneys enhance
symmetrically with contrast. No hydronephrosis. Urinary bladder
mildly thick walled.

Stomach/Bowel: There is a focally dilated loop of small bowel within
the central abdomen containing small bowel feces. There is at least
1 focal perforation of this small bowel loop with extensive adjacent
intraperitoneal fluid and gas (image 70; series 5). The inferior
portion of the small bowel loop appears herniated within the left
lower anterior abdominal wall (image 60; series 2). The more cranial
portion of the small bowel loop appears to be involved within an
additional abdominal wall hernia more cranially (image 38; series
2). There is extensive free intraperitoneal gas throughout the
abdomen as well with small amount of free intraperitoneal fluid
within the lower abdomen.

Sigmoid colonic diverticulosis. A portion of the transverse colon is
located within an abdominal wall hernia without definite evidence
for obstruction (image 29; series 2).

Vascular/Lymphatic: Normal caliber abdominal aorta. No
retroperitoneal lymphadenopathy.

Reproductive: Uterus is surgically absent.

Other: Multiple complex small and large bowel containing ventral
abdominal wall hernias are demonstrated.

Musculoskeletal: Lower lumbar spine and thoracic spine degenerative
changes. Right hip joint degenerative changes.
IMPRESSION: 1. There is a focally perforated loop of small bowel within the
central abdomen with extensive free intraperitoneal gas and fluid.
This may be a closed loop obstruction of the small bowel loop as the
inferior aspect of this loop is contained within a lower anterior
abdominal wall hernia and the more cranial portion of the small
bowel loop is contained within a central anterior abdominal wall
hernia.
2. Multiple complex ventral abdominal hernias containing both small
and large bowel.

Critical Value/emergent results were called by telephone at the time
of interpretation on 11/21/2018 at [DATE] to Dr. Linde, who verbally
acknowledged these results.

## 2021-05-24 IMAGING — CR DG ABDOMEN ACUTE W/ 1V CHEST
5 series · 5 of 5 positions shown · non-contrast
Comparison: Chest radiograph 11/16/2013

CLINICAL DATA: Abdominal pain, nausea and vomiting.

EXAM:
DG ABDOMEN ACUTE W/ 1V CHEST

[x abdomen supine]
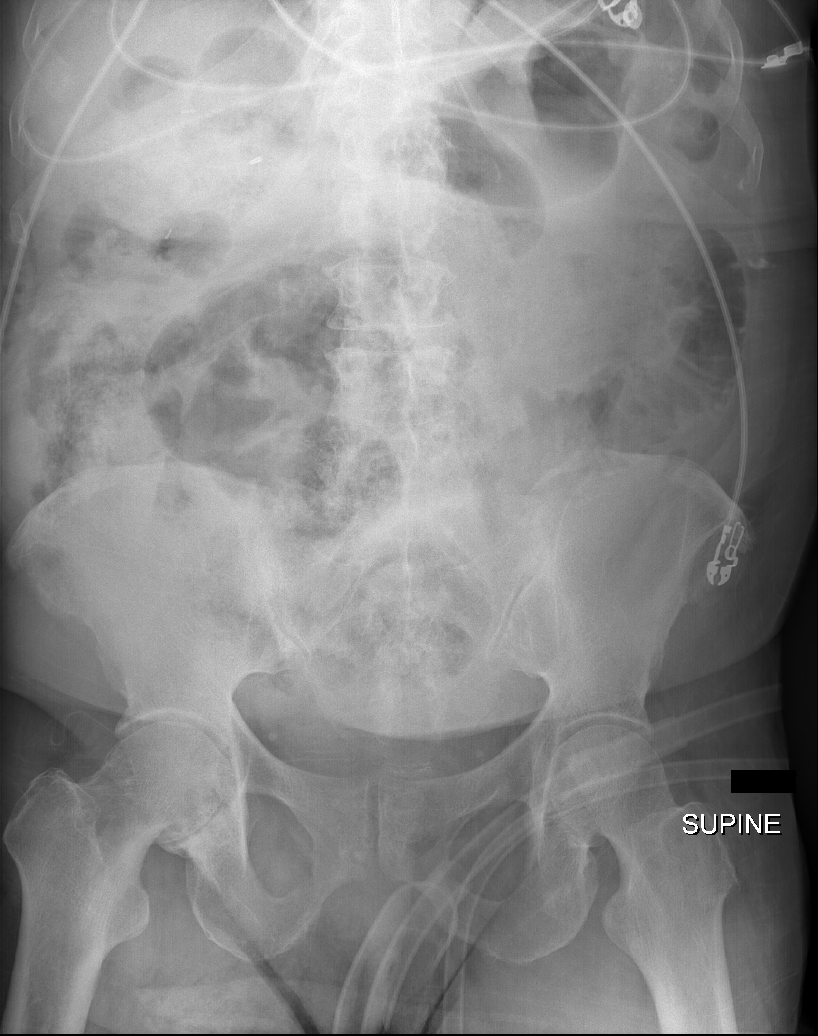

[x chest ap (1 of 2)]
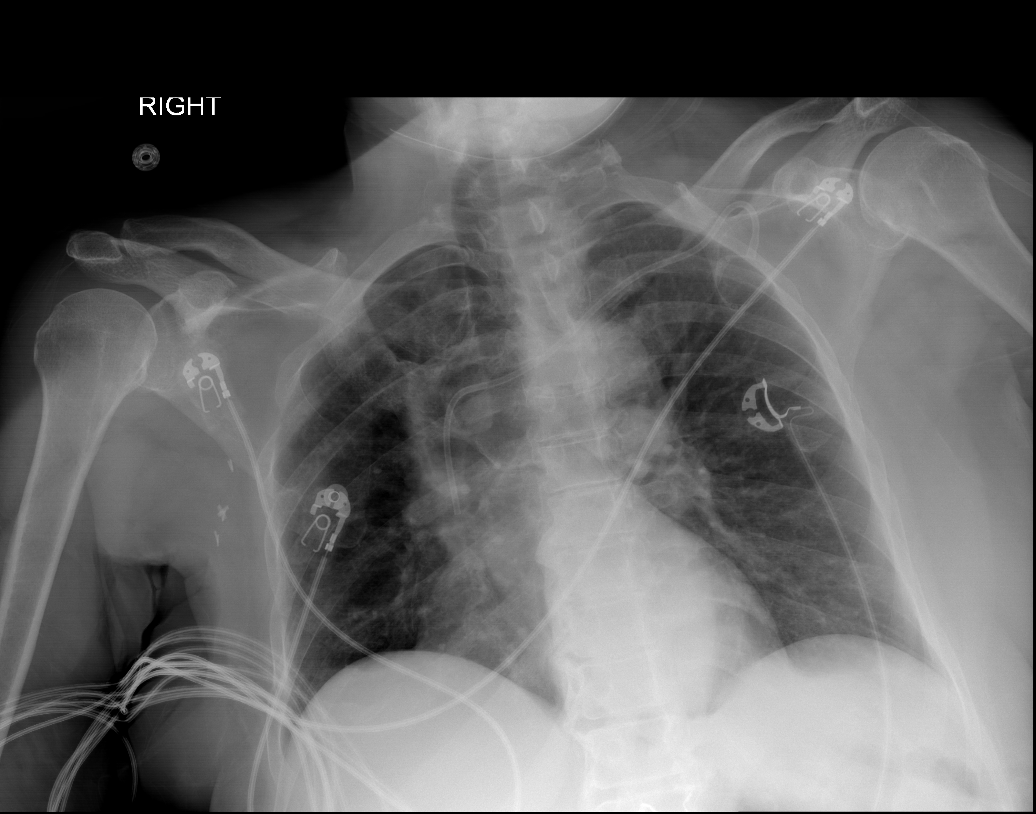

[x chest ap (2 of 2)]
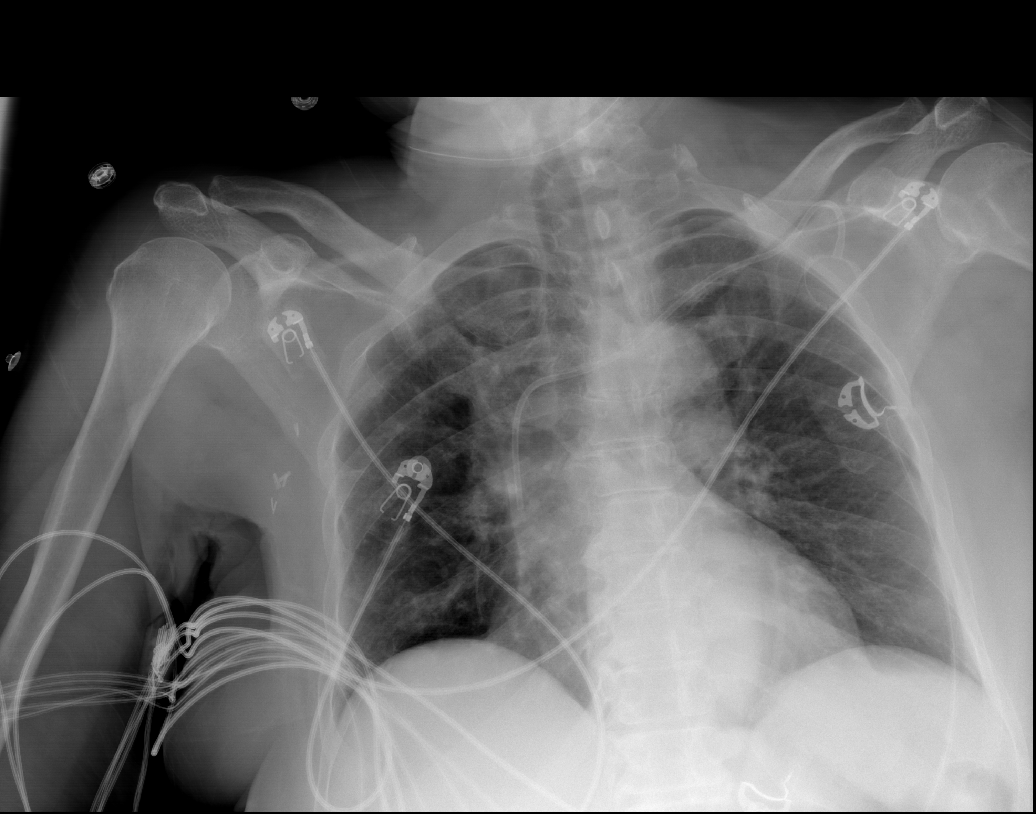

[w abdomen decub (1 of 2)]
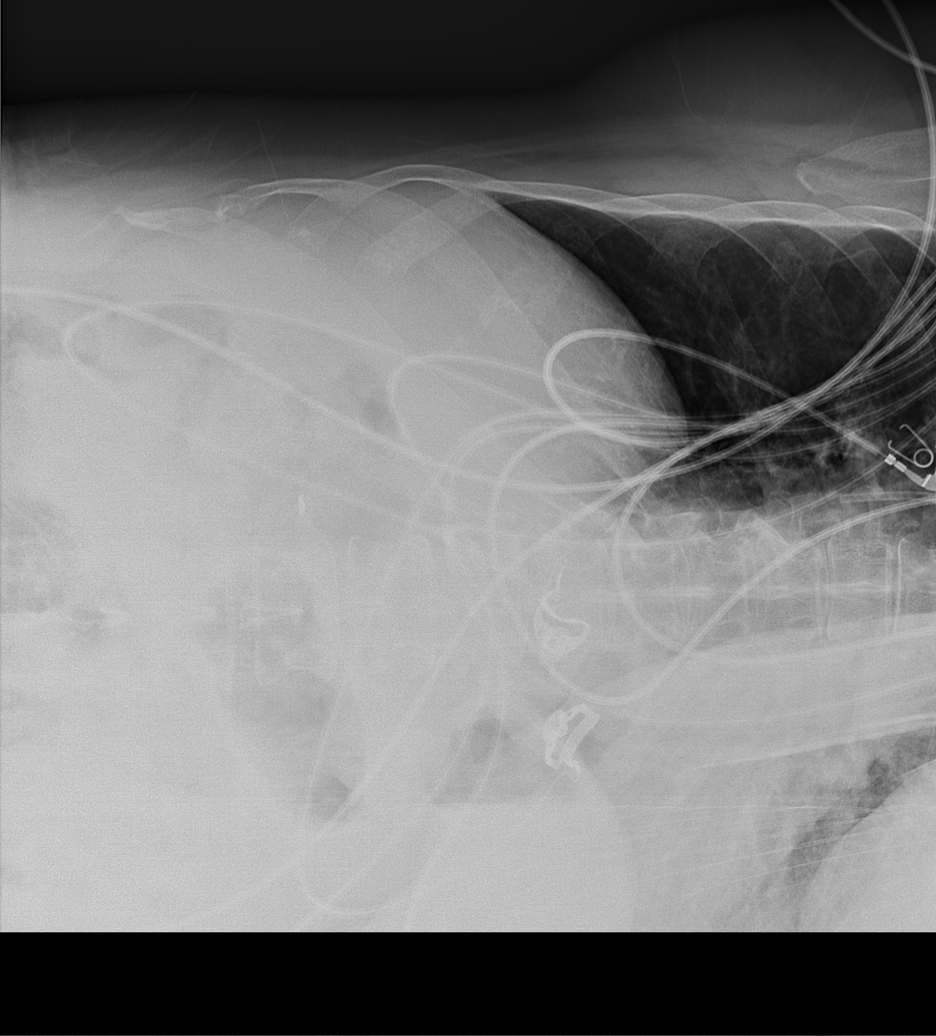

[w abdomen decub (2 of 2)]
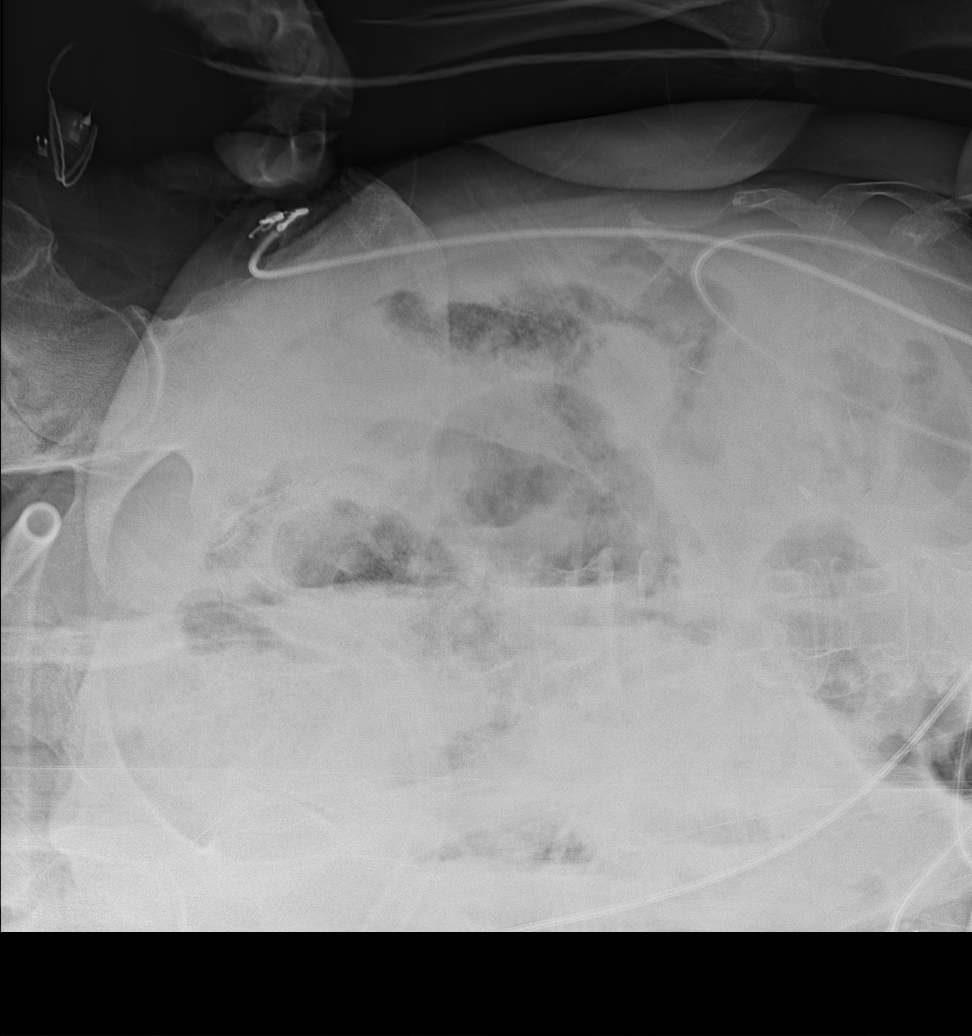

[5 of 5 positions shown; findings below may reference images not displayed]

FINDINGS: Left anterior chest wall Port-A-Cath is present with tip projecting
over the superior vena cava. Monitoring leads overlie the patient.
Stable cardiac and mediastinal contours. Low lung volumes with
bibasilar atelectasis. No large area pulmonary consolidation.

Large amount of stool throughout the colon. Multiple gaseous
distended loops of bowel throughout the central abdomen. No definite
free intraperitoneal air.

Lumbar spine degenerative changes.
IMPRESSION: Multiple gaseous distended loops of bowel throughout the abdomen
raising the possibility of ileus or possible early bowel
obstruction.

Large amount of stool throughout the colon as can be seen with
constipation.
